# Patient Record
Sex: Male | Born: 1945 | Race: White | Hispanic: No | Smoking: Former smoker
Health system: Southern US, Community
[De-identification: ages and names within clinical notes are randomized; demographics above are authoritative.]

## PROBLEM LIST (undated history)

## (undated) DIAGNOSIS — J449 Chronic obstructive pulmonary disease, unspecified: Secondary | ICD-10-CM

## (undated) DIAGNOSIS — G629 Polyneuropathy, unspecified: Secondary | ICD-10-CM

## (undated) DIAGNOSIS — I219 Acute myocardial infarction, unspecified: Secondary | ICD-10-CM

## (undated) DIAGNOSIS — D649 Anemia, unspecified: Secondary | ICD-10-CM

## (undated) DIAGNOSIS — H5440 Blindness, one eye, unspecified eye: Secondary | ICD-10-CM

## (undated) DIAGNOSIS — N400 Enlarged prostate without lower urinary tract symptoms: Secondary | ICD-10-CM

## (undated) DIAGNOSIS — F329 Major depressive disorder, single episode, unspecified: Secondary | ICD-10-CM

## (undated) DIAGNOSIS — L03119 Cellulitis of unspecified part of limb: Secondary | ICD-10-CM

## (undated) DIAGNOSIS — I739 Peripheral vascular disease, unspecified: Secondary | ICD-10-CM

## (undated) DIAGNOSIS — E871 Hypo-osmolality and hyponatremia: Secondary | ICD-10-CM

## (undated) DIAGNOSIS — L97509 Non-pressure chronic ulcer of other part of unspecified foot with unspecified severity: Secondary | ICD-10-CM

## (undated) DIAGNOSIS — I2510 Atherosclerotic heart disease of native coronary artery without angina pectoris: Secondary | ICD-10-CM

## (undated) DIAGNOSIS — I340 Nonrheumatic mitral (valve) insufficiency: Secondary | ICD-10-CM

## (undated) DIAGNOSIS — I8390 Asymptomatic varicose veins of unspecified lower extremity: Secondary | ICD-10-CM

## (undated) DIAGNOSIS — I10 Essential (primary) hypertension: Secondary | ICD-10-CM

## (undated) DIAGNOSIS — S92909A Unspecified fracture of unspecified foot, initial encounter for closed fracture: Secondary | ICD-10-CM

## (undated) DIAGNOSIS — I6522 Occlusion and stenosis of left carotid artery: Secondary | ICD-10-CM

## (undated) DIAGNOSIS — I509 Heart failure, unspecified: Secondary | ICD-10-CM

## (undated) DIAGNOSIS — E785 Hyperlipidemia, unspecified: Secondary | ICD-10-CM

## (undated) DIAGNOSIS — L02419 Cutaneous abscess of limb, unspecified: Secondary | ICD-10-CM

## (undated) DIAGNOSIS — IMO0002 Reserved for concepts with insufficient information to code with codable children: Secondary | ICD-10-CM

## (undated) DIAGNOSIS — M519 Unspecified thoracic, thoracolumbar and lumbosacral intervertebral disc disorder: Secondary | ICD-10-CM

## (undated) DIAGNOSIS — M1990 Unspecified osteoarthritis, unspecified site: Secondary | ICD-10-CM

## (undated) DIAGNOSIS — F32A Depression, unspecified: Secondary | ICD-10-CM

## (undated) DIAGNOSIS — F419 Anxiety disorder, unspecified: Secondary | ICD-10-CM

## (undated) DIAGNOSIS — K219 Gastro-esophageal reflux disease without esophagitis: Secondary | ICD-10-CM

## (undated) DIAGNOSIS — G2581 Restless legs syndrome: Secondary | ICD-10-CM

## (undated) DIAGNOSIS — G894 Chronic pain syndrome: Secondary | ICD-10-CM

## (undated) DIAGNOSIS — H539 Unspecified visual disturbance: Secondary | ICD-10-CM

## (undated) DIAGNOSIS — E119 Type 2 diabetes mellitus without complications: Secondary | ICD-10-CM

## (undated) DIAGNOSIS — Z87898 Personal history of other specified conditions: Secondary | ICD-10-CM

## (undated) DIAGNOSIS — H547 Unspecified visual loss: Secondary | ICD-10-CM

## (undated) DIAGNOSIS — E559 Vitamin D deficiency, unspecified: Secondary | ICD-10-CM

## (undated) DIAGNOSIS — E11621 Type 2 diabetes mellitus with foot ulcer: Secondary | ICD-10-CM

## (undated) HISTORY — DX: Hyperlipidemia, unspecified: E78.5

## (undated) HISTORY — DX: Anxiety disorder, unspecified: F41.9

## (undated) HISTORY — DX: Asymptomatic varicose veins of unspecified lower extremity: I83.90

## (undated) HISTORY — DX: Hypo-osmolality and hyponatremia: E87.1

## (undated) HISTORY — DX: Chronic pain syndrome: G89.4

## (undated) HISTORY — DX: Unspecified visual disturbance: H53.9

## (undated) HISTORY — PX: CATARACT EXTRACTION: SUR2

## (undated) HISTORY — DX: Essential (primary) hypertension: I10

## (undated) HISTORY — DX: Unspecified thoracic, thoracolumbar and lumbosacral intervertebral disc disorder: M51.9

## (undated) HISTORY — DX: Vitamin D deficiency, unspecified: E55.9

## (undated) HISTORY — DX: Unspecified visual loss: H54.7

## (undated) HISTORY — DX: Non-pressure chronic ulcer of other part of unspecified foot with unspecified severity: L97.509

## (undated) HISTORY — DX: Blindness, one eye, unspecified eye: H54.40

## (undated) HISTORY — DX: Chronic obstructive pulmonary disease, unspecified: J44.9

## (undated) HISTORY — DX: Unspecified osteoarthritis, unspecified site: M19.90

## (undated) HISTORY — DX: Major depressive disorder, single episode, unspecified: F32.9

## (undated) HISTORY — DX: Benign prostatic hyperplasia without lower urinary tract symptoms: N40.0

## (undated) HISTORY — DX: Type 2 diabetes mellitus with foot ulcer: E11.621

## (undated) HISTORY — DX: Depression, unspecified: F32.A

## (undated) HISTORY — DX: Type 2 diabetes mellitus without complications: E11.9

## (undated) HISTORY — DX: Anemia, unspecified: D64.9

## (undated) HISTORY — DX: Peripheral vascular disease, unspecified: I73.9

## (undated) HISTORY — DX: Gastro-esophageal reflux disease without esophagitis: K21.9

## (undated) HISTORY — PX: CARPAL TUNNEL RELEASE: SHX101

## (undated) HISTORY — PX: LUMBAR DISC SURGERY: SHX700

## (undated) HISTORY — DX: Reserved for concepts with insufficient information to code with codable children: IMO0002

## (undated) HISTORY — DX: Unspecified fracture of unspecified foot, initial encounter for closed fracture: S92.909A

## (undated) HISTORY — DX: Occlusion and stenosis of left carotid artery: I65.22

## (undated) HISTORY — DX: Polyneuropathy, unspecified: G62.9

## (undated) HISTORY — PX: ANGIOPLASTY: SHX39

## (undated) HISTORY — DX: Atherosclerotic heart disease of native coronary artery without angina pectoris: I25.10

## (undated) HISTORY — PX: NASAL SINUS SURGERY: SHX719

---

## 1967-08-17 HISTORY — PX: OTHER SURGICAL HISTORY: SHX169

## 1974-08-16 HISTORY — PX: OTHER SURGICAL HISTORY: SHX169

## 1977-08-16 HISTORY — PX: OTHER SURGICAL HISTORY: SHX169

## 1994-08-16 HISTORY — PX: OTHER SURGICAL HISTORY: SHX169

## 1996-08-16 HISTORY — PX: OTHER SURGICAL HISTORY: SHX169

## 2004-08-16 HISTORY — PX: ROTATOR CUFF REPAIR: SHX139

## 2004-08-16 HISTORY — PX: OTHER SURGICAL HISTORY: SHX169

## 2006-08-16 HISTORY — PX: OTHER SURGICAL HISTORY: SHX169

## 2007-11-27 ENCOUNTER — Encounter: Payer: Self-pay | Admitting: Internal Medicine

## 2008-08-16 HISTORY — PX: OTHER SURGICAL HISTORY: SHX169

## 2009-05-06 ENCOUNTER — Encounter: Admission: RE | Admit: 2009-05-06 | Discharge: 2009-05-15 | Payer: Self-pay | Admitting: Orthopaedic Surgery

## 2009-06-04 ENCOUNTER — Encounter: Payer: Self-pay | Admitting: Internal Medicine

## 2009-06-25 ENCOUNTER — Emergency Department (HOSPITAL_COMMUNITY): Admission: EM | Admit: 2009-06-25 | Discharge: 2009-06-25 | Payer: Self-pay | Admitting: Emergency Medicine

## 2009-07-08 ENCOUNTER — Encounter: Payer: Self-pay | Admitting: Internal Medicine

## 2009-07-25 ENCOUNTER — Encounter: Payer: Self-pay | Admitting: Internal Medicine

## 2009-08-04 ENCOUNTER — Encounter: Payer: Self-pay | Admitting: Internal Medicine

## 2009-08-05 ENCOUNTER — Ambulatory Visit: Payer: Self-pay | Admitting: Internal Medicine

## 2009-08-05 DIAGNOSIS — E785 Hyperlipidemia, unspecified: Secondary | ICD-10-CM | POA: Insufficient documentation

## 2009-08-05 DIAGNOSIS — M5137 Other intervertebral disc degeneration, lumbosacral region: Secondary | ICD-10-CM

## 2009-08-05 DIAGNOSIS — G894 Chronic pain syndrome: Secondary | ICD-10-CM

## 2009-08-05 DIAGNOSIS — E119 Type 2 diabetes mellitus without complications: Secondary | ICD-10-CM | POA: Insufficient documentation

## 2009-08-05 DIAGNOSIS — F3289 Other specified depressive episodes: Secondary | ICD-10-CM | POA: Insufficient documentation

## 2009-08-05 DIAGNOSIS — D649 Anemia, unspecified: Secondary | ICD-10-CM | POA: Insufficient documentation

## 2009-08-05 DIAGNOSIS — F411 Generalized anxiety disorder: Secondary | ICD-10-CM | POA: Insufficient documentation

## 2009-08-05 DIAGNOSIS — F329 Major depressive disorder, single episode, unspecified: Secondary | ICD-10-CM | POA: Insufficient documentation

## 2009-08-05 DIAGNOSIS — M810 Age-related osteoporosis without current pathological fracture: Secondary | ICD-10-CM | POA: Insufficient documentation

## 2009-08-05 DIAGNOSIS — I10 Essential (primary) hypertension: Secondary | ICD-10-CM | POA: Insufficient documentation

## 2009-08-05 DIAGNOSIS — K219 Gastro-esophageal reflux disease without esophagitis: Secondary | ICD-10-CM | POA: Insufficient documentation

## 2009-08-05 DIAGNOSIS — N400 Enlarged prostate without lower urinary tract symptoms: Secondary | ICD-10-CM | POA: Insufficient documentation

## 2009-08-05 LAB — CONVERTED CEMR LAB
Calcium: 9.5 mg/dL (ref 8.4–10.5)
Cholesterol: 112 mg/dL (ref 0–200)
GFR calc non Af Amer: 120.93 mL/min (ref >60)
HDL: 41 mg/dL (ref >39.00)
Hgb A1c MFr Bld: 7.2 % — ABNORMAL HIGH (ref 4.6–6.5)
Potassium: 4.4 meq/L (ref 3.5–5.1)
Sodium: 130 meq/L — ABNORMAL LOW (ref 135–145)
Triglycerides: 132 mg/dL (ref 0.0–149.0)
VLDL: 26.4 mg/dL (ref 0.0–40.0)

## 2009-08-11 ENCOUNTER — Ambulatory Visit: Payer: Self-pay | Admitting: Internal Medicine

## 2009-08-11 DIAGNOSIS — M66329 Spontaneous rupture of flexor tendons, unspecified upper arm: Secondary | ICD-10-CM | POA: Insufficient documentation

## 2009-08-18 ENCOUNTER — Telehealth: Payer: Self-pay | Admitting: Internal Medicine

## 2009-08-20 ENCOUNTER — Encounter: Payer: Self-pay | Admitting: Internal Medicine

## 2009-08-22 ENCOUNTER — Telehealth: Payer: Self-pay | Admitting: Internal Medicine

## 2009-08-29 ENCOUNTER — Encounter: Payer: Self-pay | Admitting: Internal Medicine

## 2009-08-29 DIAGNOSIS — N200 Calculus of kidney: Secondary | ICD-10-CM | POA: Insufficient documentation

## 2009-08-29 DIAGNOSIS — J329 Chronic sinusitis, unspecified: Secondary | ICD-10-CM | POA: Insufficient documentation

## 2009-08-29 DIAGNOSIS — IMO0001 Reserved for inherently not codable concepts without codable children: Secondary | ICD-10-CM

## 2009-08-29 DIAGNOSIS — E559 Vitamin D deficiency, unspecified: Secondary | ICD-10-CM

## 2009-08-29 DIAGNOSIS — E0840 Diabetes mellitus due to underlying condition with diabetic neuropathy, unspecified: Secondary | ICD-10-CM | POA: Insufficient documentation

## 2009-08-29 DIAGNOSIS — E871 Hypo-osmolality and hyponatremia: Secondary | ICD-10-CM | POA: Insufficient documentation

## 2009-08-29 DIAGNOSIS — I739 Peripheral vascular disease, unspecified: Secondary | ICD-10-CM | POA: Insufficient documentation

## 2009-08-29 DIAGNOSIS — H547 Unspecified visual loss: Secondary | ICD-10-CM | POA: Insufficient documentation

## 2009-09-08 ENCOUNTER — Telehealth (INDEPENDENT_AMBULATORY_CARE_PROVIDER_SITE_OTHER): Payer: Self-pay | Admitting: *Deleted

## 2009-09-24 ENCOUNTER — Telehealth: Payer: Self-pay | Admitting: Internal Medicine

## 2009-09-25 ENCOUNTER — Telehealth: Payer: Self-pay | Admitting: Internal Medicine

## 2009-09-29 ENCOUNTER — Telehealth (INDEPENDENT_AMBULATORY_CARE_PROVIDER_SITE_OTHER): Payer: Self-pay | Admitting: *Deleted

## 2009-09-30 ENCOUNTER — Encounter (HOSPITAL_COMMUNITY): Admission: RE | Admit: 2009-09-30 | Discharge: 2009-11-19 | Payer: Self-pay | Admitting: Internal Medicine

## 2009-09-30 ENCOUNTER — Ambulatory Visit: Payer: Self-pay

## 2009-09-30 ENCOUNTER — Ambulatory Visit: Payer: Self-pay | Admitting: Cardiology

## 2009-10-01 ENCOUNTER — Encounter: Payer: Self-pay | Admitting: Internal Medicine

## 2009-10-17 ENCOUNTER — Ambulatory Visit: Payer: Self-pay | Admitting: Internal Medicine

## 2009-10-17 DIAGNOSIS — R5383 Other fatigue: Secondary | ICD-10-CM

## 2009-10-17 DIAGNOSIS — R5381 Other malaise: Secondary | ICD-10-CM

## 2009-10-20 LAB — CONVERTED CEMR LAB
Albumin: 4.1 g/dL (ref 3.5–5.2)
Alkaline Phosphatase: 68 units/L (ref 39–117)
BUN: 13 mg/dL (ref 6–23)
Basophils Absolute: 0 10*3/uL (ref 0.0–0.1)
Bilirubin Urine: NEGATIVE
CO2: 30 meq/L (ref 19–32)
Calcium: 9.2 mg/dL (ref 8.4–10.5)
Creatinine, Ser: 0.7 mg/dL (ref 0.4–1.5)
Eosinophils Absolute: 0.1 10*3/uL (ref 0.0–0.7)
GFR calc non Af Amer: 120.86 mL/min (ref >60)
Glucose, Bld: 129 mg/dL — ABNORMAL HIGH (ref 70–99)
HDL: 55.8 mg/dL (ref >39.00)
Hemoglobin: 12.7 g/dL — ABNORMAL LOW (ref 13.0–17.0)
Iron: 40 ug/dL — ABNORMAL LOW (ref 42–165)
Ketones, ur: NEGATIVE mg/dL
LDL Cholesterol: 46 mg/dL (ref 0–99)
Lymphocytes Relative: 19.2 % (ref 12.0–46.0)
Lymphs Abs: 1.1 10*3/uL (ref 0.7–4.0)
MCHC: 32.7 g/dL (ref 30.0–36.0)
Microalb, Ur: 2.1 mg/dL — ABNORMAL HIGH (ref 0.0–1.9)
Neutro Abs: 3.5 10*3/uL (ref 1.4–7.7)
Platelets: 241 10*3/uL (ref 150.0–400.0)
RDW: 12.8 % (ref 11.5–14.6)
Saturation Ratios: 11.2 % — ABNORMAL LOW (ref 20.0–50.0)
Sodium: 126 meq/L — ABNORMAL LOW (ref 135–145)
TSH: 1.43 microintl units/mL (ref 0.35–5.50)
Total Bilirubin: 0.2 mg/dL — ABNORMAL LOW (ref 0.3–1.2)
Total CHOL/HDL Ratio: 2
Total Protein, Urine: NEGATIVE mg/dL
Transferrin: 255.1 mg/dL (ref 212.0–360.0)
VLDL: 24.8 mg/dL (ref 0.0–40.0)
Vit D, 25-Hydroxy: 29 ng/mL — ABNORMAL LOW (ref 30–89)
pH: 6.5 (ref 5.0–8.0)

## 2009-10-30 ENCOUNTER — Encounter: Payer: Self-pay | Admitting: Internal Medicine

## 2009-11-04 ENCOUNTER — Ambulatory Visit: Payer: Self-pay | Admitting: Internal Medicine

## 2009-11-13 ENCOUNTER — Ambulatory Visit: Payer: Self-pay | Admitting: Internal Medicine

## 2009-11-13 DIAGNOSIS — M549 Dorsalgia, unspecified: Secondary | ICD-10-CM | POA: Insufficient documentation

## 2009-11-25 ENCOUNTER — Emergency Department (HOSPITAL_COMMUNITY): Admission: EM | Admit: 2009-11-25 | Discharge: 2009-11-25 | Payer: Self-pay | Admitting: Family Medicine

## 2009-12-03 ENCOUNTER — Telehealth: Payer: Self-pay | Admitting: Internal Medicine

## 2009-12-16 ENCOUNTER — Encounter: Payer: Self-pay | Admitting: Internal Medicine

## 2009-12-25 ENCOUNTER — Telehealth: Payer: Self-pay | Admitting: Internal Medicine

## 2009-12-29 ENCOUNTER — Telehealth: Payer: Self-pay | Admitting: Internal Medicine

## 2010-01-08 ENCOUNTER — Telehealth: Payer: Self-pay | Admitting: Internal Medicine

## 2010-02-03 ENCOUNTER — Telehealth: Payer: Self-pay | Admitting: Internal Medicine

## 2010-03-03 ENCOUNTER — Telehealth: Payer: Self-pay | Admitting: Internal Medicine

## 2010-03-12 ENCOUNTER — Telehealth: Payer: Self-pay | Admitting: Internal Medicine

## 2010-03-13 ENCOUNTER — Ambulatory Visit: Payer: Self-pay | Admitting: Internal Medicine

## 2010-03-13 ENCOUNTER — Telehealth: Payer: Self-pay | Admitting: Internal Medicine

## 2010-03-13 DIAGNOSIS — J019 Acute sinusitis, unspecified: Secondary | ICD-10-CM

## 2010-03-14 LAB — CONVERTED CEMR LAB
CO2: 28 meq/L (ref 19–32)
Calcium: 9.3 mg/dL (ref 8.4–10.5)
Chloride: 90 meq/L — ABNORMAL LOW (ref 96–112)
HDL: 37 mg/dL — ABNORMAL LOW (ref >39.00)
Hgb A1c MFr Bld: 6.8 % — ABNORMAL HIGH (ref 4.6–6.5)
Potassium: 4.4 meq/L (ref 3.5–5.1)
Sodium: 125 meq/L — ABNORMAL LOW (ref 135–145)
Total CHOL/HDL Ratio: 5
Triglycerides: 234 mg/dL — ABNORMAL HIGH (ref 0.0–149.0)
VLDL: 46.8 mg/dL — ABNORMAL HIGH (ref 0.0–40.0)

## 2010-03-16 ENCOUNTER — Telehealth: Payer: Self-pay | Admitting: Internal Medicine

## 2010-03-19 ENCOUNTER — Telehealth: Payer: Self-pay | Admitting: Internal Medicine

## 2010-03-19 ENCOUNTER — Encounter: Payer: Self-pay | Admitting: Internal Medicine

## 2010-03-30 ENCOUNTER — Encounter: Payer: Self-pay | Admitting: Internal Medicine

## 2010-03-31 ENCOUNTER — Encounter: Payer: Self-pay | Admitting: Internal Medicine

## 2010-04-06 ENCOUNTER — Telehealth: Payer: Self-pay | Admitting: Internal Medicine

## 2010-04-20 ENCOUNTER — Emergency Department (HOSPITAL_COMMUNITY): Admission: EM | Admit: 2010-04-20 | Discharge: 2010-04-20 | Payer: Self-pay | Admitting: Emergency Medicine

## 2010-04-27 ENCOUNTER — Telehealth: Payer: Self-pay | Admitting: Internal Medicine

## 2010-06-04 ENCOUNTER — Ambulatory Visit: Payer: Self-pay | Admitting: Surgery

## 2010-09-08 ENCOUNTER — Ambulatory Visit (HOSPITAL_COMMUNITY)
Admission: RE | Admit: 2010-09-08 | Discharge: 2010-09-09 | Payer: Self-pay | Source: Home / Self Care | Attending: Cardiology | Admitting: Cardiology

## 2010-09-09 LAB — BASIC METABOLIC PANEL
CO2: 24 mEq/L (ref 19–32)
Calcium: 8.9 mg/dL (ref 8.4–10.5)
Calcium: 8.6 mg/dL (ref 8.4–10.5)
GFR calc Af Amer: >60 mL/min (ref >60)
GFR calc Af Amer: >60 mL/min (ref >60)
GFR calc non Af Amer: >60 mL/min (ref >60)
GFR calc non Af Amer: >60 mL/min (ref >60)
Glucose, Bld: 162 mg/dL — ABNORMAL HIGH (ref 70–99)
Glucose, Bld: 135 mg/dL — ABNORMAL HIGH (ref 70–99)
Potassium: 3.9 mEq/L (ref 3.5–5.1)
Potassium: 3.9 mEq/L (ref 3.5–5.1)
Sodium: 132 mEq/L — ABNORMAL LOW (ref 135–145)
Sodium: 131 mEq/L — ABNORMAL LOW (ref 135–145)

## 2010-09-09 LAB — GLUCOSE, CAPILLARY: Glucose-Capillary: 179 mg/dL — ABNORMAL HIGH (ref 70–99)

## 2010-09-09 LAB — HEMOGLOBIN AND HEMATOCRIT, BLOOD: Hemoglobin: 11.4 g/dL — ABNORMAL LOW (ref 13.0–17.0)

## 2010-09-10 LAB — GLUCOSE, CAPILLARY: Glucose-Capillary: 141 mg/dL — ABNORMAL HIGH (ref 70–99)

## 2010-09-14 ENCOUNTER — Other Ambulatory Visit: Payer: Self-pay | Admitting: Surgery

## 2010-09-14 ENCOUNTER — Ambulatory Visit: Admit: 2010-09-14 | Payer: Self-pay | Admitting: Surgery

## 2010-09-14 ENCOUNTER — Encounter
Admission: RE | Admit: 2010-09-14 | Discharge: 2010-09-14 | Payer: Self-pay | Source: Home / Self Care | Attending: Surgery | Admitting: Surgery

## 2010-09-14 DIAGNOSIS — I7409 Other arterial embolism and thrombosis of abdominal aorta: Secondary | ICD-10-CM

## 2010-09-15 NOTE — Progress Notes (Signed)
Summary: please call pt   Phone Note Call from Patient Call back at Home Phone [phone]   Caller: Patient Call For: Anthony Borg MD Summary of Call: Pt states if Dr Jenny Reichmann want a report reguarding his blood flow in his lower extremity reguarding his bood flow . the office can requset this from [name] pt signed a Medical release , would like the nurse to call so he can give the correct name of the report so Dr Jenny Reichmann can have it in his records here , mentioned this at the last office visit to Dr Jenny Reichmann , want the [name] nurse to give him a call , so he can give the information  pt is scheduled for  cardiolite  appt 2/15 @ 11:30 Initial call taken by: Nonah Mattes,  September 25, 2009 4:04 PM  Follow-up for Phone Call        pt stated that he had a procedure done at [facility] where "they put blood pressure cuffs on my leg to check pressure  in lower extremeties". I advised pt to call [facility] and 1) get the name of the test/procedure and 2) request a copy be sent to Dr. Jenny Reichmann as his PCP. pt says he would do this because he thinks this may be helpful with upcoming stress test. Follow-up by: Crissie Sickles, [place],  September 25, 2009 4:23 PM
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
## 2010-09-15 NOTE — Assessment & Plan Note (Signed)
Summary: 3 MO ROV /NWS  #    Vital Signs:  Patient profile:   65 year old male Height:      71 inches Weight:      222 pounds BMI:     31.07 O2 Sat:      97 % on Room air Temp:     98.4 degrees F oral Pulse rate:   60 / minute BP sitting:   130 / 60  (left arm) Cuff size:   large  Vitals Entered ByShirlean Mylar Ewing (October 17, 2009 1:18 PM)  O2 Flow:  Room air  Preventive Care Screening  Last Flu Shot:    Date:  06/16/2009    Results:  given   Last Pneumovax:    Date:  08/17/2007    Results:  given   Colonoscopy:    Date:  08/16/1994    Results:  normal      declines colonoscopy  CC: 3 Mo ROV/RE   CC:  3 Mo ROV/RE.  History of Present Illness: sched for ent/sinus surgury mar 18  - a "sinus obliteration reversal";  due for med refills as well for chronic pain which is stable, will also need right rot cuff repeair but not yet scheduled - per dr Carollee Leitz.  Pt denies CP, sob, doe, wheezing, orthopnea, pnd, worsening LE edema, palps, dizziness or syncope   Pt denies new neuro symptoms such as headache, facial or extremity weakness   No recent bone fx.  Pt denies polydipsia, polyuria, or low sugar symptoms such as shakiness improved with eating.  Overall good compliance with meds, trying to follow low chol, DM diet, wt stable, little excercise however   Has ongoing mild fatigue persistnet, denies OSA symtpoms.    Here for wellness Diet: Heart Healthy or DM if diabetic Physical Activities: Sedentary Depression/mood screen: Negative Hearing: Intact bilateral Visual Acuity: Grossly normal ADL's: Capable  Fall Risk: None Home Safety: Good End-of-Life Planning: Advance directive - Full code/I agree   Problems Prior to Update: 1)  Special Screening Malig Neoplasms Other Sites  (ICD-V76.49) 2)  Fatigue  (ICD-780.79) 3)  Preoperative Examination  (ICD-V72.84) 4)  Diabetic Peripheral Neuropathy  (ICD-250.60) 5)  Fibromyalgia  (ICD-729.1) 6)  Sinusitis, Chronic   (ICD-473.9) 7)  Peripheral Vascular Disease  (ICD-443.9) 8)  Visual Acuity, Decreased, Left Eye  (ICD-369.9) 9)  Unspecified Vitamin D Deficiency  (ICD-268.9) 10)  Nephrolithiasis  (ICD-592.0) 11)  Hyponatremia, Chronic  (ICD-276.1) 12)  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 1 By Mouth Once Daily 2)  Vitamin D 1000 Unit Tabs (Cholecalciferol) .Marland Kitchen.. 1 By Mouth Once Daily 3)  Tizanidine Hcl 4 Mg Tabs (Tizanidine Hcl) .Marland Kitchen.. 1 Per 6 Hours As Needed 4)  Plavix 75 Mg Tabs (Clopidogrel Bisulfate) .Marland Kitchen.. 1 By Mouth Once Daily 5)  Clonazepam 1 Mg Tabs (Clonazepam) .Marland Kitchen.. 1po Three Times A Day As Needed  - To Refill Aug 11, 2009 6)  Percocet 10-325 Mg Tabs (Oxycodone-Acetaminophen) .Marland KitchenMarland KitchenMarland Kitchen 1  By Mouth Four Times Per Day As Needed For Pain - To Fill Sep 24, 2009 7)  Flomax 0.4 Mg Caps (Tamsulosin Hcl) .Marland Kitchen.. 1 By Mouth Every Morning and 1 By Mouth Every Night 8)  Celebrex 100 Mg Caps (Celecoxib) .Marland Kitchen.. 1 By Mouth Two Times A Day 9)  Omeprazole 40 Mg Cpdr (Omeprazole) .Marland Kitchen.. 1 By Mouth Am and 1by Mouth Pm 10)  Enalapril Maleate 5 Mg Tabs (Enalapril Maleate) .Marland Kitchen.. 1 By Mouth Once Daily 11)  Glimepiride 4 Mg Tabs (Glimepiride) .Marland Kitchen.. 1 By Mouth Once  As Needed 12)  Bupropion Hcl 100 Mg Xr12h-Tab (Bupropion Hcl) .Marland Kitchen.. 1 By Mouth Three Times A Day 13)  Daily Multiple Vitamins  Tabs (Multiple Vitamin) .Marland Kitchen.. 1 By Mouth Once Daily 14)  Forteo 600 Mcg/2.51ml Soln (Teriparatide (Recombinant)) .... Use Asd 15)   Thermotabs  Tabs (Oral Electrolytes) .... 4 To 5 By Mouth Once Daily 16)  Fentanyl 100 Mcg/hr Pt72 (Fentanyl) .Marland Kitchen.. 1 Q 3 Days - To Fill Oct 04, 2009 17)  Fentanyl 25 Mcg/hr Pt72 (Fentanyl) .Marland Kitchen.. 1 Q 3 Days - To Fill Oct 04, 2009 18)  Zonisamide 50 Mg Caps (Zonisamide) .... 3 By Mouth At Bedtime  Current Medications (verified): 1)  Simvastatin 40 Mg Tabs (Simvastatin) .Marland Kitchen.. 1 By Mouth Once Daily 2)  Vitamin D 1000 Unit Tabs (Cholecalciferol) .Marland Kitchen.. 1 By Mouth Once Daily 3)  Tizanidine Hcl 4 Mg Tabs (Tizanidine Hcl) .Marland Kitchen.. 1 Per 6 Hours As Needed 4)  Plavix 75 Mg Tabs (Clopidogrel Bisulfate) .Marland Kitchen.. 1 By Mouth Once Daily 5)  Clonazepam 1 Mg Tabs (Clonazepam) .Marland Kitchen.. 1po Three Times A Day As Needed  - To Refill Aug 11, 2009 6)  Percocet 10-325 Mg Tabs (Oxycodone-Acetaminophen) .Marland Kitchen.. 1 By Mouth Four Times Per Day As Needed For Pain - To Fill Dec 23, 2009 7)  Flomax 0.4 Mg Caps (Tamsulosin Hcl) .Marland Kitchen.. 1 By Mouth Every Morning and 1 By Mouth Every Night 8)  Celebrex 100 Mg Caps (Celecoxib) .Marland Kitchen.. 1 By Mouth Two Times A Day 9)  Omeprazole 40 Mg Cpdr (Omeprazole) .Marland Kitchen.. 1 By Mouth Am and 1by Mouth Pm 10)  Enalapril Maleate 5 Mg Tabs (Enalapril Maleate) .Marland Kitchen.. 1 By Mouth Once Daily 11)  Glimepiride 4 Mg Tabs (Glimepiride) .Marland Kitchen.. 1 By Mouth Once  As Needed 12)  Bupropion Hcl 100 Mg Xr12h-Tab (Bupropion Hcl) .Marland Kitchen.. 1 By Mouth Three Times A Day 13)  Daily Multiple Vitamins  Tabs (Multiple Vitamin) .Marland Kitchen.. 1 By Mouth Once Daily 14)  Forteo 600 Mcg/2.103ml Soln (Teriparatide (Recombinant)) .... Use Asd 15)  Thermotabs  Tabs (Oral Electrolytes) .... 4 To 5 By Mouth Once Daily 16)  Fentanyl 100 Mcg/hr Pt72 (Fentanyl) .Marland Kitchen.. 1 Q 3 Days - To Fill Jan 02, 2010 17)  Fentanyl 25 Mcg/hr Pt72 (Fentanyl) .Marland Kitchen.. 1 Q 3 Days - To Fill Jan 02, 2010 18)  Zonisamide 50 Mg Caps (Zonisamide) .... 3 By Mouth At Bedtime  Allergies (verified): 1)  ! Sulfa 2)  ! 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 GERD Diabetes mellitus, type II Anxiety Depression Benign prostatic hypertrophy Hyperlipidemia chronic pain syndrome  Past Surgical History: Last updated: 08/05/2009 s/p right hand fracture 1969 s/p left ankle ganglion cyst 1976 s/p lumbar disc surgury 1981, 2004  s/p multiple sinus surguries x 8 - last 1997 with obliteration s/o left cataract 1996 s/p right cataract 1997 s/p left foot surgury 1998 s/p right foot surgury/fracture 1999 s/p left CTS 2006 s/p right  CTS 2007 s/p left wrist/hand fusion 2008 s/p left foot open repair jones fracture 5th metatarsal 2010 s/p right rotater cuff 2006 s/p vocal surgury 1996  Family History: Last updated: 08/05/2009 father and grandmother with lung cancer multiple others with throat and lung cancer, heart disease and renal failure related to "bad living" mother, sister, m-grandfather with DM  Social History: Last updated: 08/05/2009 Divorced Former Smoker Alcohol use-no 2 adult sons - 54 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         all otherwise negative per pt -  Physical Exam  General:  alert and overweight-appearing.   Head:  normocephalic and atraumatic.   Eyes:  vision grossly intact, pupils equal,  and pupils round.   Ears:  R ear normal and L ear normal.   Nose:  no external deformity and no nasal discharge.   Mouth:  no gingival abnormalities and pharynx pink and moist.   Neck:  supple and no masses.   Lungs:  normal respiratory effort and normal breath sounds.   Heart:  normal rate and regular rhythm.   Abdomen:  soft, non-tender, and normal bowel sounds.   Msk:  no joint tenderness and no joint swelling.   Extremities:  no edema, no erythema  Neurologic:  cranial nerves II-XII intact and strength normal in all extremities.   Skin:  color normal and no rashes.   Psych:  not depressed appearing and moderately anxious.     Impression & Recommendations:  Problem # 1:  Preventive Health Care (ICD-V70.0)  Overall doing well, age appropriate education and counseling updated and referral for appropriate preventive services done unless declined, immunizations up to date or declined, diet counseling done if overweight, urged to quit smoking if smokes , most recent labs reviewed and current ordered if appropriate, ecg reviewed or declined (interpretation per ECG scanned in the EMR if done); information regarding Medicare Prevention requirements given if appropriate   Orders: First annual wellness visit with prevention plan  [id number])  Problem # 2:  DISC DISEASE, LUMBAR (ICD-722.52)  stable overall by hx and exam, ok to continue meds/tx as is, for chonric pain med refill  Orders: Prescription Created Electronically [phone])  Problem # 3:  DIABETES MELLITUS, TYPE II (ICD-250.00)  His updated medication list for this problem includes:    Enalapril Maleate 5 Mg Tabs (Enalapril maleate) .Marland Kitchen... 1 by mouth once daily    Glimepiride 4 Mg Tabs (Glimepiride) .Marland Kitchen... 1 by mouth once  as needed  Orders: TLB-Lipid Panel (80061-LIPID) TLB-Microalbumin/Creat Ratio, Urine (82043-MALB) TLB-A1C / Hgb A1C (Glycohemoglobin) (83036-A1C)  Labs Reviewed: Creat: 0.7 (08/05/2009)    Reviewed HgBA1c  results: 7.2 (08/05/2009) stable overall by hx and exam, ok to continue meds/tx as is   Problem # 4:  FATIGUE (ICD-780.79)  exam benign, to check labs below; follow with expectant management   Orders: T-Vitamin D (25-Hydroxy) [id number]) TLB-BMP (Basic Metabolic Panel-BMET) (99991111)  TLB-CBC Platelet - w/Differential (85025-CBCD) TLB-Hepatic/Liver Function Pnl (80076-HEPATIC) TLB-TSH (Thyroid Stimulating Hormone) (84443-TSH) TLB-Sedimentation Rate (ESR) (85652-ESR) TLB-IBC Pnl (Iron/FE;Transferrin) (83550-IBC) TLB-B12 + Folate Pnl (82746_82607-B12/FOL) TLB-Udip ONLY (81003-UDIP)  Problem # 5:  OSTEOPOROSIS (ICD-733.00)  His updated medication list for this problem includes:    Forteo 600 Mcg/2.72ml Soln (Teriparatide (recombinant)) ..... Use asd  Orders: T-Parathyroid Hormone, Intact w/ Calcium [id number])  Discussed medication use, applications of heat or ice, and exercises.   sees [facility] endo; to check lab above, mentioned to pt new prolia though not approved for men yet  Complete Medication List: 1)  Simvastatin 40 Mg Tabs (Simvastatin) .Marland Kitchen.. 1 by mouth once daily 2)  Vitamin D 1000 Unit Tabs (Cholecalciferol) .Marland Kitchen.. 1 by mouth once daily 3)  Tizanidine Hcl 4 Mg Tabs (Tizanidine hcl) .Marland Kitchen.. 1 per 6 hours as needed 4)  Plavix 75 Mg Tabs (Clopidogrel bisulfate) .Marland Kitchen.. 1 by mouth once daily 5)  Clonazepam 1 Mg Tabs (Clonazepam) .Marland Kitchen.. 1po three times a day as needed  - to refill Aug 11, 2009 6)  Percocet 10-325 Mg Tabs (Oxycodone-acetaminophen) .Marland Kitchen.. 1 by mouth four times per day as needed for pain - to fill Dec 23, 2009 7)  Flomax 0.4 Mg Caps (Tamsulosin hcl) .Marland Kitchen.. 1 by mouth every morning and 1 by mouth every night 8)  Celebrex 100 Mg Caps (Celecoxib) .Marland Kitchen.. 1 by mouth two times a day 9)  Omeprazole 40 Mg Cpdr (Omeprazole) .Marland Kitchen.. 1 by mouth am and 1by mouth pm 10)  Enalapril Maleate 5 Mg Tabs (Enalapril maleate) .Marland Kitchen.. 1 by mouth once daily 11)  Glimepiride 4 Mg Tabs (Glimepiride)  .Marland Kitchen.. 1 by mouth once  as needed 12)  Bupropion Hcl 100 Mg Xr12h-tab (Bupropion hcl) .Marland Kitchen.. 1 by mouth three times a day 13)  Daily Multiple Vitamins Tabs (Multiple vitamin) .Marland Kitchen.. 1 by mouth once daily 14)  Forteo 600 Mcg/2.21ml Soln (Teriparatide (recombinant)) .... Use asd 15)  Thermotabs Tabs (Oral electrolytes) .... 4 to 5 by mouth once daily 16)  Fentanyl 100 Mcg/hr Pt72 (Fentanyl) .Marland Kitchen.. 1 q 3 days - to fill Jan 02, 2010 17)  Fentanyl 25 Mcg/hr Pt72 (Fentanyl) .Marland Kitchen.. 1 q 3 days - to fill Jan 02, 2010 18)  Zonisamide 50 Mg Caps (Zonisamide) .... 3 by mouth at bedtime  Other Orders: TD Toxoids IM 7 YR + [id number]) Admin 1st Vaccine [id number]) TLB-PSA (Prostate Specific Antigen) (84153-PSA)  Patient Instructions: 1)  you are given the pain medications refills for now 2)  please keep your folloup appt at [facility] and Dr Theda Sers as planned 3)  Please go to the Lab in the basement for your blood and/or urine tests today 4)  you had the tetanus shot today 5)  Please schedule a follow-up appointment in 6 months. 6)  Please call for refills in the meantime if you need them (or have the pharmacy call) Prescriptions: FENTANYL 25 MCG/HR PT72 (FENTANYL) 1 q 3 days - to fill Jan 02, 2010  #10 x 0   Entered and Authorized by:   Biagio Borg MD   Signed by:   Biagio Borg MD on 10/17/2009   Method used:   Print then Give to Patient   RxID:   [id number] FENTANYL 100 MCG/HR PT72 (FENTANYL) 1 q 3 days - to fill Jan 02, 2010  #10 x 0   Entered and Authorized by:   Biagio Borg MD   Signed by:   Biagio Borg MD on 10/17/2009   Method used:  Print then Give to Patient   RxID:   [id number] FENTANYL 25 MCG/HR PT72 (FENTANYL) 1 q 3 days - to fill Dec 03, 2009  #10 x 0   Entered and Authorized by:   Biagio Borg MD   Signed by:   Biagio Borg MD on 10/17/2009   Method used:   Print then Give to Patient   RxID:   [id number] FENTANYL 100 MCG/HR PT72 (FENTANYL) 1 q 3 days - to fill Dec 03, 2009  #10 x  0   Entered and Authorized by:   Biagio Borg MD   Signed by:   Biagio Borg MD on 10/17/2009   Method used:   Print then Give to Patient   RxID:   [id number] PERCOCET 10-325 MG TABS (OXYCODONE-ACETAMINOPHEN) 1 by mouth four times per day as needed for pain - to fill Dec 23, 2009  #120 x 0   Entered and Authorized by:   Biagio Borg MD   Signed by:   Biagio Borg MD on 10/17/2009   Method used:   Print then Give to Patient   RxID:   [id number] PERCOCET 10-325 MG TABS (OXYCODONE-ACETAMINOPHEN) 1 by mouth four times per day as needed for pain - to fill Nov 23, 2009  #120 x 0   Entered and Authorized by:   Biagio Borg MD   Signed by:   Biagio Borg MD on 10/17/2009   Method used:   Print then Give to Patient   RxID:   [id number] PERCOCET 10-325 MG TABS (OXYCODONE-ACETAMINOPHEN) 1 by mouth four times per day as needed for pain - to fill Oct 24, 2009  #120 x 0   Entered and Authorized by:   Biagio Borg MD   Signed by:   Biagio Borg MD on 10/17/2009   Method used:   Print then Give to Patient   RxID:   [id number] FENTANYL 25 MCG/HR PT72 (FENTANYL) 1 q 3 days - to fill Nov 03, 2009  #10 x 0   Entered and Authorized by:   Biagio Borg MD   Signed by:   Biagio Borg MD on 10/17/2009   Method used:   Print then Give to Patient   RxID:   [id number] FENTANYL 100 MCG/HR PT72 (FENTANYL) 1 q 3 days - to fill mar21, 2011  #10 x 0   Entered and Authorized by:   Biagio Borg MD   Signed by:   Biagio Borg MD on 10/17/2009   Method used:   Print then Give to Patient   [id number]    Immunizations Administered:  Tetanus Vaccine:    Vaccine Type: Td    Site: left deltoid    Mfr: GlaxoSmithKline    Dose: 0.5 ml    Route: IM    Given by: Shirlean Mylar Ewing    Exp. Date: 07/01/2011    Lot #: [id number]    VIS given: 07/04/07 version given October 17, 2009.

## 2010-09-15 NOTE — Progress Notes (Signed)
Summary: Records received from [facility] received from [facility] and forwarded to Dr. Jenny Reichmann for review. Dena Chavis  September 08, 2009 3:52 PM  Appended Document: Records received from [facility] received from [facility] and forwarded to Dr. Jenny Reichmann for review.

## 2010-09-15 NOTE — Progress Notes (Signed)
Summary: PAIN PATCH REFILL  Phone Note Call from Patient Call back at Home Phone [phone]   Caller: [place] Summary of Call: PATIENT WANTS TO PICK UP Arley.  HE WANTS BOTH, 100 MG AND 25 MG.  HIS PHARMACY IS [facility]  [address] HOME [phone] CELL [phone] Initial call taken by: Glena Norfolk,  April 06, 2010 2:04 PM  Follow-up for Phone Call        Pt informed, Rx in cabinet for pt pick up Follow-up by: Crissie Sickles, [place],  April 07, 2010 8:02 AM    New/Updated Medications: FENTANYL 100 MCG/HR PT72 (FENTANYL) 1 q 3 days - to fill aug 28. 2011 FENTANYL 25 MCG/HR PT72 (FENTANYL) 1 q 3 days - to fill Apr 12, 2010 Prescriptions: FENTANYL 100 MCG/HR PT72 (FENTANYL) 1 q 3 days - to fill aug 28. 2011  #10 x 0   Entered and Authorized by:   Biagio Borg MD   Signed by:   Biagio Borg MD on 04/06/2010   Method used:   Print then Give to Patient   RxID:   [id number] FENTANYL 25 MCG/HR PT72 (FENTANYL) 1 q 3 days - to fill Apr 12, 2010  #10 x 0   Entered and Authorized by:   Biagio Borg MD   Signed by:   Biagio Borg MD on 04/06/2010   Method used:   Print then Give to Patient   RxID:   [id number]  done hardcopy to LIM side B - dahlia Biagio Borg MD  April 06, 2010 5:27 PM

## 2010-09-15 NOTE — Letter (Signed)
Summary: DIsmissal Activation Form  DIsmissal Activation Form   Imported By: Sherri Sear 06/03/2010 12:09:51  _____________________________________________________________________  External Attachment:    Type:   Image     Comment:   External Document

## 2010-09-15 NOTE — Progress Notes (Signed)
  Phone Note Refill Request  on August 18, 2009 3:45 PM  Refills Requested: Medication #1:  ENALAPRIL MALEATE 5 MG TABS 1 by mouth once daily   Dosage confirmed as above?Dosage Confirmed   Notes: [facility] [address] [phone] Initial call taken by: Sharon Seller,  August 18, 2009 3:45 PM    Prescriptions: ENALAPRIL MALEATE 5 MG TABS (ENALAPRIL MALEATE) 1 by mouth once daily  #30 x 11   Entered by:   Sharon Seller   Authorized by:   Biagio Borg MD   Signed by:   Sharon Seller on 08/18/2009   Method used:   Faxed to ...       [id number] (ret[facility]       [address]       [place], [place]  [id number]       Ph: [phone] or [phone]       Fax: [phone]   RxID:   [id number]

## 2010-09-15 NOTE — Assessment & Plan Note (Signed)
Summary: walk in, bad back pain,ok robin/cd   Vital Signs:  Patient profile:   65 year old male Height:      72 inches Weight:      227.50 pounds BMI:     30.97 O2 Sat:      96 % on Room air Temp:     97.3 degrees F oral Pulse rate:   57 / minute BP sitting:   110 / 50  (left arm) Cuff size:   large  Vitals Entered ByShirlean Mylar Ewing (November 13, 2009 3:04 PM)  O2 Flow:  Room air CC: Back Pain/RE   CC:  Back Pain/RE.  History of Present Illness: here with increased pain to the back from the shoulder blades to lowest lumbar area, about 3 to 4 days; ? spasms involved;  mod to severe;  current muscle relaxer not helping;   no falls or trauma, pain seems like before when found to have spontanous fracture per pt;  no neck pain, no extremity numb, wweak, or pain to extremities; no bowel or bladder changes;  no fever , wt loss, night sweats, gait problem.   has been taking the salt tabs since last visit.  also taking the [place] now instead of the sulfonyurea and no further low sugars;  Pt denies polydipsia, polyuria, or low sugar symptoms such as shakiness improved with eating.  Overall good compliance with meds, trying to follow low chol, DM diet, wt stable, little excercise however .  Pt denies new other neuro symptoms such as headache, facial or extremity weakness .    Problems Prior to Update: 1)  Back Pain  (ICD-724.5) 2)  Hyponatremia, Chronic  (ICD-276.1) 3)  Preventive Health Care  (ICD-V70.0) 4)  Special Screening Malig Neoplasms Other Sites  (ICD-V76.49) 5)  Fatigue  (ICD-780.79) 6)  Preoperative Examination  (ICD-V72.84) 7)  Diabetic Peripheral Neuropathy  (ICD-250.60) 8)  Fibromyalgia  (ICD-729.1) 9)  Sinusitis, Chronic  (ICD-473.9) 10)  Peripheral Vascular Disease  (ICD-443.9) 11)  Visual Acuity, Decreased, Left Eye  (ICD-369.9) 12)  Unspecified Vitamin D Deficiency  (ICD-268.9) 13)  Nephrolithiasis  (ICD-592.0) 14)  Hyponatremia, Chronic  (ICD-276.1) 15)  Chronic Right  Facial Pain/numb Due To Mult Sinus Surguries  () 16)  Biceps Tendon Rupture, Right  (ICD-727.62) 17)  Chronic Pain Syndrome  (ICD-338.4) 18)  Hyperlipidemia  (ICD-272.4) 19)  Benign Prostatic Hypertrophy  (ICD-600.00) 20)  Depression  (ICD-311) 21)  Anxiety  (ICD-300.00) 22)  Diabetes Mellitus, Type II  (ICD-250.00) 23)  Gerd  (ICD-530.81) 24)  Osteoporosis  (ICD-733.00) 25)  Anemia-nos  (ICD-285.9) 26)  Hypertension  (ICD-401.9) 27)  Disc Disease, Lumbar  (ICD-722.52)  Medications Prior to Update: 1)  Simvastatin 40 Mg Tabs (Simvastatin) .Marland Kitchen.. 1 By Mouth Once Daily 2)  Vitamin D 1000 Unit Tabs (Cholecalciferol) .Marland Kitchen.. 1 By Mouth Once Daily 3)  Tizanidine Hcl 4 Mg Tabs (Tizanidine Hcl) .Marland Kitchen.. 1 Per 6 Hours As Needed 4)  Plavix 75 Mg Tabs (Clopidogrel Bisulfate) .Marland Kitchen.. 1 By Mouth Once Daily 5)  Clonazepam 1 Mg Tabs (Clonazepam) .Marland Kitchen.. 1po Three Times A Day As Needed  - To Refill Aug 11, 2009 6)  Percocet 10-325 Mg Tabs (Oxycodone-Acetaminophen) .Marland Kitchen.. 1 By Mouth Four Times Per Day As Needed For Pain - To Fill Dec 23, 2009 7)  Flomax 0.4 Mg Caps (Tamsulosin Hcl) .Marland Kitchen.. 1 By Mouth Every Morning and 1 By Mouth Every Night 8)  Celebrex 100 Mg Caps (Celecoxib) .Marland Kitchen.. 1 By Mouth Two Times A Day 9)  Omeprazole 40 Mg Cpdr (Omeprazole) .Marland Kitchen.. 1 By Mouth Am and 1by Mouth Pm 10)  Enalapril Maleate 5 Mg Tabs (Enalapril Maleate) .Marland Kitchen.. 1 By Mouth Once Daily 11)  Januvia 50 Mg Tabs (Sitagliptin Phosphate) .Marland Kitchen.. 1po Once Daily 12)  Bupropion Hcl 100 Mg Xr12h-Tab (Bupropion Hcl) .Marland Kitchen.. 1 By Mouth Three Times A Day 13)  Daily Multiple Vitamins  Tabs (Multiple Vitamin) .Marland Kitchen.. 1 By Mouth Once Daily 14)  Forteo 600 Mcg/2.63ml Soln (Teriparatide (Recombinant)) .... Use Asd 15)  Thermotabs  Tabs (Oral Electrolytes) .... 4 To 5 By Mouth Once Daily 16)  Fentanyl 100 Mcg/hr Pt72 (Fentanyl) .Marland Kitchen.. 1 Q 3 Days - To Fill Jan 02, 2010 17)  Fentanyl 25 Mcg/hr Pt72 (Fentanyl) .Marland Kitchen.. 1 Q 3 Days - To Fill Jan 02, 2010 18)  Zonisamide 50 Mg Caps  (Zonisamide) .... 3 By Mouth At Bedtime 19)  Voltaren 1 % Gel (Diclofenac Sodium) .... Use Asd Two Times A Day  Current Medications (verified): 1)  Simvastatin 40 Mg Tabs (Simvastatin) .Marland Kitchen.. 1 By Mouth Once Daily 2)  Vitamin D 1000 Unit Tabs (Cholecalciferol) .Marland Kitchen.. 1 By Mouth Once Daily 3)  Flexeril 5 Mg Tabs (Cyclobenzaprine Hcl) .Marland Kitchen.. 1po Three Times A Day 4)  Plavix 75 Mg Tabs (Clopidogrel Bisulfate) .Marland Kitchen.. 1 By Mouth Once Daily 5)  Clonazepam 1 Mg Tabs (Clonazepam) .Marland Kitchen.. 1po Three Times A Day As Needed  - To Refill Aug 11, 2009 6)  Percocet 10-325 Mg Tabs (Oxycodone-Acetaminophen) .Marland Kitchen.. 1 By Mouth Four Times Per Day As Needed For Pain - To Fill Dec 23, 2009 7)  Flomax 0.4 Mg Caps (Tamsulosin Hcl) .Marland Kitchen.. 1 By Mouth Every Morning and 1 By Mouth Every Night 8)  Celebrex 100 Mg Caps (Celecoxib) .Marland Kitchen.. 1 By Mouth Two Times A Day 9)  Omeprazole 40 Mg Cpdr (Omeprazole) .Marland Kitchen.. 1 By Mouth Am and 1by Mouth Pm 10)  Enalapril Maleate 5 Mg Tabs (Enalapril Maleate) .Marland Kitchen.. 1 By Mouth Once Daily 11)  Januvia 50 Mg Tabs (Sitagliptin Phosphate) .Marland Kitchen.. 1po Once Daily 12)  Bupropion Hcl 100 Mg Xr12h-Tab (Bupropion Hcl) .Marland Kitchen.. 1 By Mouth Three Times A Day 13)  Daily Multiple Vitamins  Tabs (Multiple Vitamin) .Marland Kitchen.. 1 By Mouth Once Daily 14)  Forteo 600 Mcg/2.65ml Soln (Teriparatide (Recombinant)) .... Use Asd 15)  Thermotabs  Tabs (Oral Electrolytes) .... 4 To 5 By Mouth Once Daily 16)  Fentanyl 100 Mcg/hr Pt72 (Fentanyl) .Marland Kitchen.. 1 Q 3 Days - To Fill Jan 02, 2010 17)  Fentanyl 25 Mcg/hr Pt72 (Fentanyl) .Marland Kitchen.. 1 Q 3 Days - To Fill Jan 02, 2010 18)  Zonisamide 50 Mg Caps (Zonisamide) .... 3 By Mouth At Bedtime 19)  Voltaren 1 % Gel (Diclofenac Sodium) .... Use Asd Two Times A Day  Allergies (verified): 1)  ! Sulfa 2)  ! 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 GERD Diabetes mellitus, type II Anxiety Depression Benign prostatic hypertrophy Hyperlipidemia chronic pain syndrome  Past Surgical History: Last updated: 08/05/2009 s/p right hand fracture 1969 s/p left ankle ganglion cyst 1976 s/p lumbar disc surgury 1981, 2004 s/p multiple sinus surguries x 8 - last 1997 with obliteration s/o left cataract 1996 s/p right cataract 1997 s/p left foot surgury 1998 s/p right foot surgury/fracture 1999 s/p left  CTS 2006 s/p right  CTS 2007 s/p left wrist/hand fusion 2008 s/p left foot open repair jones fracture 5th metatarsal 2010 s/p right rotater cuff 2006 s/p vocal surgury 1996  Social History: Last updated: 08/05/2009 Divorced Former Smoker Alcohol use-no 2 adult sons - 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   Head:  normocephalic and atraumatic.   Eyes:  vision grossly intact, pupils equal, and pupils round.   Ears:  R ear normal and L ear normal.   Nose:  no external deformity and no nasal discharge.   Mouth:  no gingival abnormalities and pharynx pink and moist.   Neck:  supple and no masses.   Lungs:  normal respiratory effort and normal breath sounds.   Heart:  normal rate and regular rhythm.   Msk:  no cervical spine or paracervical spine tender;  no specific thoracic spine or lumbar tender except for approx l2 where pt states he has chronic tender;  also with marked left paralumbar area muscular spasm;  less to the right lumbar paravertebral as well as thoracic paravertebral milder tender Extremities:  no edema, no erythema  Neurologic:  cranial nerves II-XII intact and strength normal in all extremities.     Impression &  Recommendations:  Problem # 1:  BACK PAIN (ICD-724.5)  His updated medication list for this problem includes:    Flexeril 5 Mg Tabs (Cyclobenzaprine hcl) .Marland Kitchen... 1po three times a day    Percocet 10-325 Mg Tabs (Oxycodone-acetaminophen) .Marland Kitchen... 1 by mouth four times per day as needed for pain - to fill Dec 23, 2009    Celebrex 100 Mg Caps (Celecoxib) .Marland Kitchen... 1 by mouth two times a day    Fentanyl 100 Mcg/hr Pt72 (Fentanyl) .Marland Kitchen... 1 q 3 days - to fill Jan 02, 2010    Fentanyl 25 Mcg/hr Pt72 (Fentanyl) .Marland Kitchen... 1 q 3 days - to fill Jan 02, 2010 Continue all previous medications as before this visit except  change the tizaindine to flexeril as needed, for films today to r/o spont fx, but suspect more likely severe MSK strain  Orders: T-Thoracic Spine 2 Views [phone]) T-Lumbar Spine Complete, 5 Views ([id number])  Problem # 2:  HYPONATREMIA, CHRONIC (ICD-276.1) mentation seems improved today (somewhat slowed last visit) , declines repeat sodium today  Problem # 3:  DIABETES MELLITUS, TYPE II (ICD-250.00)  His updated medication list for this problem includes:    Enalapril Maleate 5 Mg Tabs (Enalapril maleate) .Marland Kitchen... 1 by mouth once daily    Januvia 50 Mg Tabs (Sitagliptin phosphate) .Marland Kitchen... 1po once daily tolerating the januvia better withour further low sugars, Continue all previous medications as before this visit , f/u labs with endo apr 27 and/or at next visit  Labs Reviewed: Creat: 0.7 (10/17/2009)    Reviewed HgBA1c results: 7.5 (10/17/2009)  7.2 (08/05/2009)  Problem # 4:  OSTEOPOROSIS (ICD-733.00)  His updated medication list for this problem includes:    Forteo 600 Mcg/2.48ml Soln (Teriparatide (recombinant)) ..... Use asd also asked pt to bring up the idea of prolia with endocrine if he has new fx on forteo  Complete Medication List: 1)  Simvastatin 40 Mg Tabs (Simvastatin) .Marland Kitchen.. 1 by mouth once daily 2)  Vitamin D 1000 Unit Tabs (Cholecalciferol) .Marland Kitchen.. 1 by mouth once daily  3)  Flexeril  5 Mg Tabs (Cyclobenzaprine hcl) .Marland Kitchen.. 1po three times a day 4)  Plavix 75 Mg Tabs (Clopidogrel bisulfate) .Marland Kitchen.. 1 by mouth once daily 5)  Clonazepam 1 Mg Tabs (Clonazepam) .Marland Kitchen.. 1po three times a day as needed  - to refill Aug 11, 2009 6)  Percocet 10-325 Mg Tabs (Oxycodone-acetaminophen) .Marland Kitchen.. 1 by mouth four times per day as needed for pain - to fill Dec 23, 2009 7)  Flomax 0.4 Mg Caps (Tamsulosin hcl) .Marland Kitchen.. 1 by mouth every morning and 1 by mouth every night 8)  Celebrex 100 Mg Caps (Celecoxib) .Marland Kitchen.. 1 by mouth two times a day 9)  Omeprazole 40 Mg Cpdr (Omeprazole) .Marland Kitchen.. 1 by mouth am and 1by mouth pm 10)  Enalapril Maleate 5 Mg Tabs (Enalapril maleate) .Marland Kitchen.. 1 by mouth once daily 11)  Januvia 50 Mg Tabs (Sitagliptin phosphate) .Marland Kitchen.. 1po once daily 12)  Bupropion Hcl 100 Mg Xr12h-tab (Bupropion hcl) .Marland Kitchen.. 1 by mouth three times a day 13)  Daily Multiple Vitamins Tabs (Multiple vitamin) .Marland Kitchen.. 1 by mouth once daily 14)  Forteo 600 Mcg/2.55ml Soln (Teriparatide (recombinant)) .... Use asd 15)  Thermotabs Tabs (Oral electrolytes) .... 4 to 5 by mouth once daily 16)  Fentanyl 100 Mcg/hr Pt72 (Fentanyl) .Marland Kitchen.. 1 q 3 days - to fill Jan 02, 2010 17)  Fentanyl 25 Mcg/hr Pt72 (Fentanyl) .Marland Kitchen.. 1 q 3 days - to fill Jan 02, 2010 18)  Zonisamide 50 Mg Caps (Zonisamide) .... 3 by mouth at bedtime 19)  Voltaren 1 % Gel (Diclofenac sodium) .... Use asd two times a day  Patient Instructions: 1)  please bring up the possiblity of prolia for osteoporosis treatment at your next appt apr 27 with endocrinology 2)  stop the tizanidine 3)  start the flexeril as prescribed 4)  Please go to Radiology in the basement level for your X-Ray today  5)  Continue all previous medications as before this visit  6)  Please schedule a follow-up appointment in 4 months. Prescriptions: FLEXERIL 5 MG TABS (CYCLOBENZAPRINE HCL) 1po three times a day  #60 x 1   Entered and Authorized by:   Biagio Borg MD   Signed by:   Biagio Borg MD on  11/13/2009   Method used:   Print then Give to Patient   RxID:   [id number]

## 2010-09-15 NOTE — Letter (Signed)
Summary: Activation form and USPS information  Activation form and USPS information   Imported By: Sherri Sear 06/03/2010 12:10:53  _____________________________________________________________________  External Attachment:    Type:   Image     Comment:   External Document

## 2010-09-15 NOTE — Letter (Signed)
Summary: Date Range:11-27-07 to 07-08-09/[facility]  Date Range:11-27-07 to 07-08-09/[facility]   Imported By: Phillis Knack 09/01/2009 11:15:45  _____________________________________________________________________  External Attachment:    Type:   Image     Comment:   External Document

## 2010-09-15 NOTE — Progress Notes (Signed)
  Phone Note Refill Request  on Dec 29, 2009 11:03 AM  Refills Requested: Medication #1:  FORTEO 600 MCG/2.4ML SOLN use asd   Dosage confirmed as above?Dosage Confirmed   Notes: [facility] Initial call taken by: Sharon Seller,  Dec 29, 2009 11:03 AM    Prescriptions: FORTEO 600 MCG/2.4ML SOLN (TERIPARATIDE (RECOMBINANT)) use asd  #1 month x 6   Entered by:   Shirlean Mylar Ewing   Authorized by:   Biagio Borg MD   Signed by:   Sharon Seller on 12/29/2009   Method used:   Faxed to ...       [facility] [address] [phone]* (retail)       [address]       [place], [place]  [address]       Ph: [phone]       Fax: [phone]   [place]:   [phone]

## 2010-09-15 NOTE — Letter (Signed)
Summary: [name]   Imported By: Phillis Knack 09/13/2009 09:56:54  _____________________________________________________________________  External Attachment:    Type:   Image     Comment:   External Document

## 2010-09-15 NOTE — Letter (Signed)
Summary: Letter regarding Rx/Patient  Letter regarding Rx/Patient   Imported By: Phillis Knack 04/01/2010 11:51:04  _____________________________________________________________________  External Attachment:    Type:   Image     Comment:   External Document

## 2010-09-15 NOTE — Assessment & Plan Note (Signed)
Summary: 6 MO ROV /NWS #   Vital Signs:  Patient profile:   65 year old male Height:      72 inches Weight:      221.50 pounds BMI:     30.15 O2 Sat:      96 % on Room air Temp:     98.1 degrees F oral Pulse rate:   78 / minute BP sitting:   118 / 68  (left arm) Cuff size:   large  Vitals Entered By: Shirlean Mylar Ewing CMA (AAMA) (March 13, 2010 1:10 PM)  O2 Flow:  Room air CC: 6 month ROV/RE   CC:  6 month ROV/RE.  History of Present Illness: here to f/u; overall doing well;  fell in the bathtub 2 days ago; thinks may have fx bone in the spine - pain level about 5/10, constant, dull, better to lie on the left sde, no bowel or bladder changes,  No  wt loss, night sweats, loss of appetite or other constitutional symptoms,  Deep breaths make it worse, and no LE pain/weak/numb. Worse to get up from chair as well.   Metanx started recently seemed to make neuropathy discomfort worse.  Pt denies CP, sob, doe, wheezing, orthopnea, pnd, worsening LE edema, palps, dizziness or syncope  Pt denies new neuro symptoms such as headache, facial or extremity weakness Pt denies polydipsia, polyuria, or low sugar symptoms such as shakiness improved with eating.  Overall good compliance with meds, trying to follow low chol, DM diet, wt stable, little excercise however   incidently with acute onset 3 days mild to mod facial pain behind the nose, with fever, greenish congestion, headache and malaise, without ST, cough or wheezing.    Preventive Screening-Counseling & Management      Drug Use:  no.    Problems Prior to Update: 1)  Back Pain  (ICD-724.5) 2)  Hyponatremia, Chronic  (ICD-276.1) 3)  Preventive Health Care  (ICD-V70.0) 4)  Special Screening Malig Neoplasms Other Sites  (ICD-V76.49) 5)  Fatigue  (ICD-780.79) 6)  Preoperative Examination  (ICD-V72.84) 7)  Diabetic Peripheral Neuropathy  (ICD-250.60) 8)  Fibromyalgia  (ICD-729.1) 9)  Sinusitis, Chronic  (ICD-473.9) 10)  Peripheral Vascular  Disease  (ICD-443.9) 11)  Visual Acuity, Decreased, Left Eye  (ICD-369.9) 12)  Unspecified Vitamin D Deficiency  (ICD-268.9) 13)  Nephrolithiasis  (ICD-592.0) 14)  Hyponatremia, Chronic  (ICD-276.1) 15)  Chronic Right Facial Pain/numb Due To Mult Sinus Surguries  () 16)  Biceps Tendon Rupture, Right  (ICD-727.62) 17)  Chronic Pain Syndrome  (ICD-338.4) 18)  Hyperlipidemia  (ICD-272.4) 19)  Benign Prostatic Hypertrophy  (ICD-600.00) 20)  Depression  (ICD-311) 21)  Anxiety  (ICD-300.00) 22)  Diabetes Mellitus, Type II  (ICD-250.00) 23)  Gerd  (ICD-530.81) 24)  Osteoporosis  (ICD-733.00) 25)  Anemia-nos  (ICD-285.9) 26)  Hypertension  (ICD-401.9) 27)  Disc Disease, Lumbar  (ICD-722.52)  Medications Prior to Update: 1)  Simvastatin 40 Mg Tabs (Simvastatin) .Marland Kitchen.. 1 By Mouth Once Daily 2)  Vitamin D 1000 Unit Tabs (Cholecalciferol) .Marland Kitchen.. 1 By Mouth Once Daily 3)  Flexeril 5 Mg Tabs (Cyclobenzaprine Hcl) .Marland Kitchen.. 1po Three Times A Day 4)  Plavix 75 Mg Tabs (Clopidogrel Bisulfate) .Marland Kitchen.. 1 By Mouth Once Daily 5)  Clonazepam 1 Mg Tabs (Clonazepam) .Marland Kitchen.. 1po Three Times A Day As Needed  - To Refill February 02, 2010 6)  Percocet 10-325 Mg Tabs (Oxycodone-Acetaminophen) .Marland Kitchen.. 1 By Mouth Four Times Per Day As Needed For Pain - To [facility] February 21, 2010 7)  Flomax 0.4 Mg Caps (Tamsulosin Hcl) .Marland Kitchen.. 1 By Mouth Every Morning and 1 By Mouth Every Night 8)  Celebrex 100 Mg Caps (Celecoxib) .Marland Kitchen.. 1 By Mouth Two Times A Day 9)  Omeprazole 40 Mg Cpdr (Omeprazole) .Marland Kitchen.. 1 By Mouth Am and 1by Mouth Pm 10)  Enalapril Maleate 5 Mg Tabs (Enalapril Maleate) .Marland Kitchen.. 1 By Mouth Once Daily 11)  Januvia 50 Mg Tabs (Sitagliptin Phosphate) .Marland Kitchen.. 1po Once Daily 12)  Bupropion Hcl 100 Mg Xr12h-Tab (Bupropion Hcl) .Marland Kitchen.. 1 By Mouth Three Times A Day 13)  Daily Multiple Vitamins  Tabs (Multiple Vitamin) .Marland Kitchen.. 1 By Mouth Once Daily 14)  Forteo 600 Mcg/2.29ml Soln (Teriparatide (Recombinant)) .... Use Asd 15)  Thermotabs  Tabs (Oral Electrolytes)  .... 4 To 5 By Mouth Once Daily 16)  Fentanyl 100 Mcg/hr Pt72 (Fentanyl) .Marland Kitchen.. 1 Q 3 Days - To Fill March 03, 2010 17)  Fentanyl 25 Mcg/hr Pt72 (Fentanyl) .Marland Kitchen.. 1 Q 3 Days - To Fill March 03, 2010 18)  Zonisamide 50 Mg Caps (Zonisamide) .... 3 By Mouth At Bedtime 19)  Voltaren 1 % Gel (Diclofenac Sodium) .... Use Asd Two Times A Day  Current Medications (verified): 1)  Simvastatin 40 Mg Tabs (Simvastatin) .Marland Kitchen.. 1 By Mouth Once Daily 2)  Vitamin D 1000 Unit Tabs (Cholecalciferol) .Marland Kitchen.. 1 By Mouth Once Daily 3)  Flexeril 5 Mg Tabs (Cyclobenzaprine Hcl) .Marland Kitchen.. 1po Three Times A Day 4)  Plavix 75 Mg Tabs (Clopidogrel Bisulfate) .Marland Kitchen.. 1 By Mouth Once Daily 5)  Clonazepam 1 Mg Tabs (Clonazepam) .Marland Kitchen.. 1po Three Times A Day As Needed  - To Refill February 02, 2010 6)  Percocet 10-325 Mg Tabs (Oxycodone-Acetaminophen) .Marland Kitchen.. 1 By Mouth Four Times Per Day As Needed For Pain - To Fill February 21, 2010 7)  Flomax 0.4 Mg Caps (Tamsulosin Hcl) .Marland Kitchen.. 1 By Mouth Every Morning and 1 By Mouth Every Night 8)  Celebrex 100 Mg Caps (Celecoxib) .Marland Kitchen.. 1 By Mouth Two Times A Day 9)  Omeprazole 40 Mg Cpdr (Omeprazole) .Marland Kitchen.. 1 By Mouth Am and 1by Mouth Pm 10)  Enalapril Maleate 5 Mg Tabs (Enalapril Maleate) .Marland Kitchen.. 1 By Mouth Once Daily 11)  Januvia 50 Mg Tabs (Sitagliptin Phosphate) .Marland Kitchen.. 1po Once Daily 12)  Bupropion Hcl 100 Mg Xr12h-Tab (Bupropion Hcl) .Marland Kitchen.. 1 By Mouth Three Times A Day 13)  Daily Multiple Vitamins  Tabs (Multiple Vitamin) .Marland Kitchen.. 1 By Mouth Once Daily 14)  Forteo 600 Mcg/2.64ml Soln (Teriparatide (Recombinant)) .... Use Asd 15)  Thermotabs  Tabs (Oral Electrolytes) .... 4 To 5 By Mouth Once Daily 16)  Fentanyl 100 Mcg/hr Pt72 (Fentanyl) .Marland Kitchen.. 1 Q 3 Days - To Fill Jun 01, 2010 17)  Fentanyl 25 Mcg/hr Pt72 (Fentanyl) .Marland Kitchen.. 1 Q 3 Days - To Fill Jun 01, 2010 18)  Zonisamide 50 Mg Caps (Zonisamide) .... 3 By Mouth At Bedtime 19)  Voltaren 1 % Gel (Diclofenac Sodium) .... Use Asd Two Times A Day 20)  Metanx 3-35-2 Mg Tabs  (L-Methylfolate-B6-B12) .Marland Kitchen.. 1 By Mouth Two Times A Day 21)  Erythromycin Base 500 Mg Tabs (Erythromycin Base) .Marland Kitchen.. 1 By Mouth Four Times Per Day  Allergies (verified): 1)  ! Sulfa 2)  ! 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 GERD Diabetes mellitus, type II Anxiety Depression Benign prostatic hypertrophy Hyperlipidemia chronic pain syndrome hyponatremia, chronic  Past Surgical History: s/p right hand fracture 1969 s/p left ankle ganglion cyst 1976 s/p lumbar disc surgury 1981, 2004 s/p multiple sinus surguries x 8 - last 1997 with obliteration s/o left cataract 1996 s/p right cataract 1997 s/p left foot surgury 1998 s/p right foot surgury/fracture 1999 s/p left CTS 2006 s/p right  CTS 2007 s/p left wrist/hand fusion 2008 s/p left foot open repair jones fracture 5th metatarsal 2010 s/p right rotater cuff 2006, then repeat 2011 - Dr Theda Sers s/p vocal surgury 1996  Social History: Reviewed history from 08/05/2009 and no changes required. 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  , mild ill  Head:  normocephalic and atraumatic.   Eyes:  vision grossly intact, pupils equal, and pupils round.   Ears:  bilat tm's red, sinus  tender bilat Nose:  nasal dischargemucosal pallor and mucosal edema.   Mouth:  pharyngeal erythema and fair dentition.   Neck:  supple and cervical lymphadenopathy.   Lungs:  normal respiratory effort and normal breath sounds.   Heart:  normal rate and regular rhythm.   Abdomen:  soft, non-tender, and normal bowel sounds.   Msk:  spine midline tender lower thoracic wihtout paravertebral sweling, tender Extremities:  no edema, no erythema  Neurologic:  cranial nerves II-XII intact and strength normal in all extremities.     Impression & Recommendations:  Problem # 1:  SINUSITIS, ACUTE (ICD-461.9)  treat as above, f/u any worsening signs or symptoms   His updated medication list for this problem includes:    Erythromycin Base 500 Mg Tabs (Erythromycin base) .Marland Kitchen... 1 by mouth four times per day  Problem # 2:  OSTEOPOROSIS (ICD-733.00)  His updated medication list for this problem includes:    Forteo 600 Mcg/2.25ml Soln (Teriparatide (recombinant)) ..... Use asd pt plans to cont with [facility] with tx through april 2012, then change to IV reclast;  I asked him to bring up the iea of prolia to consider   Problem # 3:  BACK PAIN (ICD-724.5)  His updated medication list for this problem includes:    Flexeril 5 Mg Tabs (Cyclobenzaprine hcl) .Marland Kitchen... 1po three times a day    Percocet 10-325 Mg Tabs (Oxycodone-acetaminophen) .Marland Kitchen... 1 by mouth four times per day as needed for pain - to fill February 21, 2010    Celebrex 100 Mg Caps (Celecoxib) .Marland Kitchen... 1 by mouth two times a day    Fentanyl 100 Mcg/hr Pt72 (Fentanyl) .Marland Kitchen... 1 q 3 days - to fill Jun 01, 2010    Fentanyl 25 Mcg/hr Pt72 (Fentanyl) .Marland Kitchen... 1 q 3 days - to fill Jun 01, 2010 acute on chronic , for repeat films today - thoracic only., Continue all previous medications as before this visit   Orders: T-Thoracic Spine 2 Views [id number])  Problem # 4:  DIABETES MELLITUS, TYPE II (ICD-250.00)  His updated medication list  for this problem includes:     Enalapril Maleate 5 Mg Tabs (Enalapril maleate) .Marland Kitchen... 1 by mouth once daily    Januvia 50 Mg Tabs (Sitagliptin phosphate) .Marland Kitchen... 1po once daily  Labs Reviewed: Creat: 0.7 (10/17/2009)    Reviewed HgBA1c results: 7.5 (10/17/2009)  7.2 (08/05/2009) stable overall by hx and exam, ok to continue meds/tx as is , Pt to cont DM diet, excercise, wt loss efforts; to check labs today   Orders: TLB-BMP (Basic Metabolic Panel-BMET) (99991111) TLB-Lipid Panel (80061-LIPID) TLB-A1C / Hgb A1C (Glycohemoglobin) (83036-A1C)  Complete Medication List: 1)  Simvastatin 40 Mg Tabs (Simvastatin) .Marland Kitchen.. 1 by mouth once daily 2)  Vitamin D 1000 Unit Tabs (Cholecalciferol) .Marland Kitchen.. 1 by mouth once daily 3)  Flexeril 5 Mg Tabs (Cyclobenzaprine hcl) .Marland Kitchen.. 1po three times a day 4)  Plavix 75 Mg Tabs (Clopidogrel bisulfate) .Marland Kitchen.. 1 by mouth once daily 5)  Clonazepam 1 Mg Tabs (Clonazepam) .Marland Kitchen.. 1po three times a day as needed  - to refill February 02, 2010 6)  Percocet 10-325 Mg Tabs (Oxycodone-acetaminophen) .Marland Kitchen.. 1 by mouth four times per day as needed for pain - to fill February 21, 2010 7)  Flomax 0.4 Mg Caps (Tamsulosin hcl) .Marland Kitchen.. 1 by mouth every morning and 1 by mouth every night 8)  Celebrex 100 Mg Caps (Celecoxib) .Marland Kitchen.. 1 by mouth two times a day 9)  Omeprazole 40 Mg Cpdr (Omeprazole) .Marland Kitchen.. 1 by mouth am and 1by mouth pm 10)  Enalapril Maleate 5 Mg Tabs (Enalapril maleate) .Marland Kitchen.. 1 by mouth once daily 11)  Januvia 50 Mg Tabs (Sitagliptin phosphate) .Marland Kitchen.. 1po once daily 12)  Bupropion Hcl 100 Mg Xr12h-tab (Bupropion hcl) .Marland Kitchen.. 1 by mouth three times a day 13)  Daily Multiple Vitamins Tabs (Multiple vitamin) .Marland Kitchen.. 1 by mouth once daily 14)  Forteo 600 Mcg/2.24ml Soln (Teriparatide (recombinant)) .... Use asd 15)  Thermotabs Tabs (Oral electrolytes) .... 4 to 5 by mouth once daily 16)  Fentanyl 100 Mcg/hr Pt72 (Fentanyl) .Marland Kitchen.. 1 q 3 days - to fill Jun 01, 2010 17)  Fentanyl 25 Mcg/hr Pt72 (Fentanyl) .Marland Kitchen.. 1 q 3 days - to fill Jun 01, 2010 18)  Zonisamide 50 Mg Caps (Zonisamide) .... 3 by mouth at bedtime 19)  Voltaren 1 % Gel (Diclofenac sodium) .... Use asd two times a day 20)  Metanx 3-35-2 Mg Tabs (L-methylfolate-b6-b12) .Marland Kitchen.. 1 by mouth two times a day 21)  Erythromycin Base 500 Mg Tabs (Erythromycin base) .Marland Kitchen.. 1 by mouth four times per day  Patient Instructions: 1)  Please take all new medications as prescribed 2)  Continue all previous medications as before this visit  3)  Please go to the Lab in the basement for your blood and/or urine tests today 4)  You are given the pain medicatin refills;  remember we have to very strict about the dates to fill; the last prescriptioins were for july 19;  so the next should be filled aug 18 5)  Please schedule a follow-up appointment in feb 2012 for your "yearly medicare exam" Prescriptions: FENTANYL 100 MCG/HR PT72 (FENTANYL) 1 q 3 days - to fill Jun 01, 2010  #10 x 0   Entered and Authorized by:   Biagio Borg MD   Signed by:   Biagio Borg MD on 03/13/2010   Method used:   Print then Give to Patient   RxID:   [id number] FENTANYL 100 MCG/HR PT72 (FENTANYL) 1 q 3 days - to fill sept 17, 2011  #  10 x 0   Entered and Authorized by:   Biagio Borg MD   Signed by:   Biagio Borg MD on 03/13/2010   Method used:   Print then Give to Patient   RxID:   [id number] FENTANYL 100 MCG/HR PT72 (FENTANYL) 1 q 3 days - to fill Apr 02, 2010  #10 x 0   Entered and Authorized by:   Biagio Borg MD   Signed by:   Biagio Borg MD on 03/13/2010   Method used:   Print then Give to Patient   RxID:   [id number] FENTANYL 25 MCG/HR PT72 (FENTANYL) 1 q 3 days - to fill Jun 01, 2010  #10 x 0   Entered and Authorized by:   Biagio Borg MD   Signed by:   Biagio Borg MD on 03/13/2010   Method used:   Print then Give to Patient   RxID:   [id number] FENTANYL 25 MCG/HR PT72 (FENTANYL) 1 q 3 days - to fill sept 17, 2011  #10 x 0   Entered and Authorized by:   Biagio Borg MD    Signed by:   Biagio Borg MD on 03/13/2010   Method used:   Print then Give to Patient   RxID:   [id number] FENTANYL 25 MCG/HR PT72 (FENTANYL) 1 q 3 days - to fill Apr 02, 2010  #10 x 0   Entered and Authorized by:   Biagio Borg MD   Signed by:   Biagio Borg MD on 03/13/2010   Method used:   Print then Give to Patient   RxIDQR:[id number] ERYTHROMYCIN BASE 500 MG TABS (ERYTHROMYCIN BASE) 1 by mouth four times per day  #40 x 0   Entered and Authorized by:   Biagio Borg MD   Signed by:   Biagio Borg MD on 03/13/2010   Method used:   Print then Give to Patient   RxID:   [id number]

## 2010-09-15 NOTE — Progress Notes (Signed)
  Phone Note Call from Patient   Caller: Patient Summary of Call: Patient walked in office today requesting refill on Clonazepam and Zonisamide sent to [place]. He stated we had denied refill.  Pt. can be reached at [phone] or [phone] Initial call taken by: Robin Ewing CMA Deborra Medina),  March 03, 2010 11:33 AM  Follow-up for Phone Call        no on the clonazepam, as he still has refills ,  and  I normally do not prescribe the zonisamide  -   Does have his neurologist to do this?   Follow-up by: Biagio Borg MD,  March 03, 2010 1:12 PM  Additional Follow-up for Phone Call Additional follow up Details #1::        called pt left msg. to call back Additional Follow-up by: Robin Ewing CMA Deborra Medina),  March 03, 2010 1:49 PM    Additional Follow-up for Phone Call Additional follow up Details #2::    Patient came by office and did get his refills on clonazepam, but he does not have a neurologist that prescribes Zonisamide and would like a refill. Please Advice, he uses [facility] Follow-up by: Shirlean Mylar Ewing CMA Deborra Medina),  March 03, 2010 2:31 PM  Additional Follow-up for Phone Call Additional follow up Details #3:: Details for Additional Follow-up Action Taken: ok  - will do per emr Additional Follow-up by: Biagio Borg MD,  March 03, 2010 4:11 PM  Prescriptions: ZONISAMIDE 50 MG CAPS (ZONISAMIDE) 3 by mouth at bedtime  #90 x 5   Entered and Authorized by:   Biagio Borg MD   Signed by:   Biagio Borg MD on 03/03/2010   Method used:   Electronically to        [facility] [address] # [phone]* (retail)       [address]       [place], [place]  [address]       Ph: [phone]       Fax: [phone]   [place]:   [phone]

## 2010-09-15 NOTE — Progress Notes (Signed)
Summary: Paperwork  Phone Note Call from Patient Call back at [facility] Phone [phone]   Caller: Patient Call For: Biagio Borg MD Summary of Call: Patient dropped off a form to be completed for [facility] by Dr. Jenny Reichmann. Please fax the form when completed. Initial call taken by: Katy Apo,  September 24, 2009 10:49 AM  Follow-up for Phone Call        please call pt - can we do a heart stress test prior to surgury - should be able to get done early next wk most likely if we order now;  this would be to show that he could have a 2 hr surgury without high risk of heart attack Follow-up by: Biagio Borg MD,  September 24, 2009 12:48 PM  Additional Follow-up for Phone Call Additional follow up Details #1::        left message on machine for pt to return my call  Additional Follow-up by: Crissie Sickles, [place],  September 24, 2009 12:58 PM  New Problems: PREOPERATIVE EXAMINATION (ICD-V72.84)   Additional Follow-up for Phone Call Additional follow up Details #2::    pt says that he cannot do a stress test because he has trouble with his shoulder and back. he says he also has a blister on his foot. please advise? Follow-up by: Crissie Sickles, [place],  September 25, 2009 11:41 AM  Additional Follow-up for Phone Call Additional follow up Details #3:: Details for Additional Follow-up Action Taken: we can do the the type of test where he does not have to walk (is done with medication);  we will need to order this so that he can be safe with the surgury - done per EMR, and he should be called shortly Additional Follow-up by: Biagio Borg MD,  September 25, 2009 1:11 PM  New Problems: PREOPERATIVE EXAMINATION (ICD-V72.84) pt informed via VM and told to call back with any further questions or concerns, pt also advised to expect call from [facility]. Crissie Sickles, [place]  September 25, 2009 2:19 PM

## 2010-09-15 NOTE — Letter (Signed)
Summary: [place]   Imported By: Phillis Knack 09/13/2009 09:54:53  _____________________________________________________________________  External Attachment:    Type:   Image     Comment:   External Document

## 2010-09-15 NOTE — Progress Notes (Signed)
Summary: Rx refill req  Phone Note Refill Request   Refills Requested: Medication #1:  PERCOCET 10-325 MG TABS 1 by mouth four times per day as needed for pain - to fill may 10   Last Refilled: 12/23/2009  Medication #2:  FENTANYL 25 MCG/HR PT72 1 q 3 days - to fill may 20   Last Refilled: 01/02/2010 pt states that he has appt with [facility] 7/31 and is requesting refill to last untill appt.  Initial call taken by: Crissie Sickles, [place],  Jan 08, 2010 11:21 AM  Follow-up for Phone Call        all done hardcopy to LIM side B - dahlia  Follow-up by: Biagio Borg MD,  Jan 08, 2010 5:25 PM  Additional Follow-up for Phone Call Additional follow up Details #1::        pt informed, Rx in cabinet for pt pick up Additional Follow-up by: Crissie Sickles, [place],  Jan 09, 2010 8:09 AM    New/Updated Medications: PERCOCET 10-325 MG TABS (OXYCODONE-ACETAMINOPHEN) 1 by mouth four times per day as needed for pain - to fill January 22, 2010 PERCOCET 10-325 MG TABS (OXYCODONE-ACETAMINOPHEN) 1 by mouth four times per day as needed for pain - to fill February 21, 2010 FENTANYL 100 MCG/HR PT72 (FENTANYL) 1 q 3 days - to fill February 01, 2010 FENTANYL 100 MCG/HR PT72 (FENTANYL) 1 q 3 days - to fill March 03, 2010 FENTANYL 25 MCG/HR PT72 (FENTANYL) 1 q 3 days - to fill March 03, 2010 FENTANYL 25 MCG/HR PT72 (FENTANYL) 1 q 3 days - to fill Jan 01, 2010 FENTANYL 25 MCG/HR PT72 (FENTANYL) 1 q 3 days - to fill February 01, 2010 Prescriptions: FENTANYL 100 MCG/HR PT72 (FENTANYL) 1 q 3 days - to fill March 03, 2010  #10 x 0   Entered and Authorized by:   Biagio Borg MD   Signed by:   Biagio Borg MD on 01/08/2010   Method used:   Print then Give to Patient   RxID:   [id number] FENTANYL 100 MCG/HR PT72 (FENTANYL) 1 q 3 days - to fill February 01, 2010  #10 x 0   Entered and Authorized by:   Biagio Borg MD   Signed by:   Biagio Borg MD on 01/08/2010   Method used:   Print then Give to Patient   RxID:    [id number] FENTANYL 25 MCG/HR PT72 (FENTANYL) 1 q 3 days - to fill March 03, 2010  #10 x 0   Entered and Authorized by:   Biagio Borg MD   Signed by:   Biagio Borg MD on 01/08/2010   Method used:   Print then Give to Patient   RxID:   [id number] FENTANYL 25 MCG/HR PT72 (FENTANYL) 1 q 3 days - to fill February 01, 2010  #10 x 0   Entered and Authorized by:   Biagio Borg MD   Signed by:   Biagio Borg MD on 01/08/2010   Method used:   Print then Give to Patient   RxID:   [id number] FENTANYL 25 MCG/HR PT72 (FENTANYL) 1 q 3 days - to fill Jan 01, 2010  #10 x 0   Entered and Authorized by:   Biagio Borg MD   Signed by:   Biagio Borg MD on 01/08/2010   Method used:   Print then Give to Patient   RxID:   [id number] PERCOCET 10-325 MG TABS (OXYCODONE-ACETAMINOPHEN) 1 by  mouth four times per day as needed for pain - to fill February 21, 2010  #120 x 0   Entered and Authorized by:   Biagio Borg MD   Signed by:   Biagio Borg MD on 01/08/2010   Method used:   Print then Give to Patient   RxID:   [id number] PERCOCET 10-325 MG TABS (OXYCODONE-ACETAMINOPHEN) 1 by mouth four times per day as needed for pain - to fill January 22, 2010  #120 x 0   Entered and Authorized by:   Biagio Borg MD   Signed by:   Biagio Borg MD on 01/08/2010   Method used:   Print then Give to Patient   RxID:   [id number]

## 2010-09-15 NOTE — Letter (Signed)
Summary: [facility]   Imported By: Phillis Knack 01/08/2010 12:25:52  _____________________________________________________________________  External Attachment:    Type:   Image     Comment:   External Document

## 2010-09-15 NOTE — Letter (Addendum)
Summary: [facility] FROM THE PRACTICE  [facility] [address]   [place], [place] [address]   Phone: [phone]  Fax: [phone]    03/30/2010  Rodriquez Paulick [address] [place], [place]  [address]  Dear Mr. BLATZ,       This letter is to make you aware that I can no   longer serve as your primary care physician, and you  are dismissed as an active patient.  There has been  difficulty with the narcotic prescriptions, and some  threatening language in the letter you sent regarding  those prescriptions.  We will remain available for 30  from the date of this letter for medication refills   and emergency care.  Please feel free to request a copy  of your medical information from our Medical Records  Department to assist in transferring your care.  If you  need assistance with determing a possible new physician,  please contact the [facility]erral [facility].        Sincerely,   Cathlean Cower MD  Appended Document: Letter sent Indicators placed in IDX and EMR. Letter sent out by Certified Mail.  Appended Document: Unsuccessful delivery-sent 1st class mail Certified letter returned after three unsuccessful attempts. Letter resent by 1st class mail.

## 2010-09-15 NOTE — Progress Notes (Signed)
Summary: PAIN MEDICATION  Phone Note Call from Patient   Summary of Call: Pt left a note at the front desk. He will be out of his pain medications early, this monday the 10th. This is due to the recent injury of his bicep. He would like another rx until refill is due on the 21st.   Initial call taken by: Charlsie Quest, [place],  August 22, 2009 2:05 PM  Follow-up for Phone Call        ok for qid dosing for the next month rx only  - should be able to decrese again after that - BUT he will need to bring in his jan and feb prescriptions already done to exchange for the new ones Follow-up by: Biagio Borg MD,  August 22, 2009 2:19 PM  Additional Follow-up for Phone Call Additional follow up Details #1::        left message on machine to call back to office. Additional Follow-up by: Ernestene Mention,  August 22, 2009 2:30 PM    Additional Follow-up for Phone Call Additional follow up Details #2::    Patient notified and will come by on Monday. Follow-up by: Ernestene Mention,  August 22, 2009 4:40 PM  Additional Follow-up for Phone Call Additional follow up Details #3:: Details for Additional Follow-up Action Taken: noted Additional Follow-up by: Biagio Borg MD,  August 22, 2009 5:04 PM

## 2010-09-15 NOTE — Progress Notes (Signed)
Summary: Medication  Phone Note From Pharmacy   Caller: Lowanda Foster Dr. # [phone]* Summary of Call: pharmacy called stating drug interaction between erthromycin and simvastatin and fantanyl for this patient. Do you still want them to fill this prescription?( Erthromycin 500 base)? Initial call taken by: Robin Ewing CMA Deborra Medina),  March 16, 2010 9:19 AM  Follow-up for Phone Call        ok to fill this time only Follow-up by: Biagio Borg MD,  March 16, 2010 12:46 PM  Additional Follow-up for Phone Call Additional follow up Details #1::        called [facility] and spoke to the pharmacist and informed ok to fill this time only. Additional Follow-up by: Robin Ewing CMA Deborra Medina),  March 16, 2010 1:38 PM

## 2010-09-15 NOTE — Progress Notes (Signed)
Summary: REFILL ON ENALAPRIL AND PLAVIX  Phone Note Call from Patient Call back at Home Phone [phone]   Caller: [place] Summary of Call: PT IS REQUESTING ENALAPRIL 5 MG WHICH WAS DENIED.  HE WAS PUT ON IT AND PLAVIX 75 MG AFTER A STROKE IN HIS LEFT EYE. HE WANTS BOTH SENT TO [facility] [place]  [id number]. PT'S PHONE: [phone]   CELL [phone]   PLEASE CALL PT. Initial call taken by: Glena Norfolk,  February 03, 2010 10:17 AM    Prescriptions: ENALAPRIL MALEATE 5 MG TABS (ENALAPRIL MALEATE) 1 by mouth once daily  #30 x 5   Entered by:   Crissie Sickles, CMA   Authorized by:   Biagio Borg MD   Signed by:   Crissie Sickles, CMA on 02/03/2010   Method used:   Electronically to        [facility] # [phone]* (retail)       [address]       [place], [place]  [address]       Ph: [phone]       Fax: [phone]   RxID:   [id number] PLAVIX 75 MG TABS (CLOPIDOGREL BISULFATE) 1 by mouth once daily  #30 x 5   Entered by:   Crissie Sickles, CMA   Authorized by:   Biagio Borg MD   Signed by:   Crissie Sickles, CMA on 02/03/2010   Method used:   Electronically to        [facility] # [phone]* (retail)       [address]       [place], [place]  [address]       Ph: [phone]       Fax: [phone]   [place]:   [phone]

## 2010-09-15 NOTE — Progress Notes (Signed)
Summary: Nuclear Pre-Procedure  Phone Note Outgoing Call   Call placed by: Perrin Maltese, EMT-P,  September 29, 2009 4:17 PM Summary of Call: Reviewed information on Myoview Information Sheet (see scanned document for further details).  Spoke with patient.     Nuclear Med Background Indications for Stress Test: Evaluation for Ischemia, Surgical Clearance  Indications Comments: Pending (R) Bicep tear       Nuclear Pre-Procedure Cardiac Risk Factors: CVA, Family History - CAD, History of Smoking, Lipids, NIDDM, PVD Height (in): 69  Nuclear Med Study Referring MD:  J.John

## 2010-09-15 NOTE — Assessment & Plan Note (Signed)
Summary: Cardiology Nuclear Study  Nuclear Med Background Indications for Stress Test: Evaluation for Ischemia, Surgical Clearance  Indications Comments: Pending Sinus surgery at [facility] on 10/14/09 and (R) Bicep tear by Dr. Hart Robinsons TBA.   History Comments: NO DOCUMENTED CAD  Symptoms: Chest Pain, Chest Pain with Exertion, Dizziness, DOE, Fatigue, Light-Headedness, SOB  Symptoms Comments: Last episode of [id number] had an episode of significant CP after walking into house with SOB about 1 month ago that lasted for about 15 minutes; relieved with rest and pain medication.  He did not go to hospital for symptoms.   Nuclear Pre-Procedure Cardiac Risk Factors: Claudication, CVA, Family History - CAD, History of Smoking, Lipids, NIDDM, PVD Caffeine/Decaff Intake: yes NPO After: 7:30 AM Lungs: Clear.  O2 Sat 95% on RA. IV 0.9% NS with Angio Cath: 18g     IV Site: (R) Forearm IV Started by: Eliezer Lofts EMT-P Chest Size (in) 48     Height (in): 73 Weight (lb): 223 BMI: 29.53  Nuclear Med Study 1 or 2 day study:  1 day     Stress Test Type:  Dobutamine Reading MD:  Dola Argyle, MD     Referring MD:  Cathlean Cower, MD Resting Radionuclide:  Technetium 66m Tetrofosmin     Resting Radionuclide Dose:  11.0 mCi  Stress Radionuclide:  Technetium 14m Tetrofosmin     Stress Radionuclide Dose:  33.0 mCi   Stress Protocol Exercise Time (min):  15:00 min     Max HR:  137 bpm     Predicted Max HR:  A999333 bpm  Max Systolic BP: 123XX123 mm Hg     Percent Max HR:  87.26 %Rate Pressure Product:  [id number]  Dose of Atropine: 0.25 mg  Dose of Dobutamine:  40 mcg/kg/min (at max HR)  Stress Test Technologist:  Valetta Fuller CMA-N     Nuclear Technologist:  Mariann Laster Deal RT-N  Rest Procedure  Myocardial perfusion imaging was performed at rest 45 minutes following the intravenous administration of Myoview Technetium 36m Tetrofosmin.  Stress Procedure  The patient was to receive lexiscan initially, but due to him having  two cups of coffee this morning, his test had to be changed to dobutamine.  He then received IV dobutamine and 0.25 mg IV atropine. There were no significant changes with infusion, only occasional PVC's and PAC's.   Myoview was injected at peak heart rate and quantitative spect images were obtained after a 45 minute delay.  QPS Raw Data Images:  Patient motion noted; appropriate software correction applied. Stress Images:  No diagnostic abnormalities Rest Images:  Same as stress Subtraction (SDS):  No evidence of ischemia. Transient Ischemic Dilatation:  1.05  (Normal <1.22)  Lung/Heart Ratio:  .35  (Normal <0.45)  Quantitative Gated Spect Images QGS EDV:  137 ml QGS ESV:  61 ml QGS EF:  55 % QGS cine images:  NORMAL MOTION  Findings Normal nuclear study      Overall Impression  Exercise Capacity: Dobutamine stress BP Response: Normal blood pressure response. Clinical Symptoms: No chest pain ECG Impression: No significant ST segment change suggestive of ischemia. Overall Impression: Normal stress nuclear study.  Appended Document: Cardiology Nuclear Study LMOPT - labs negative, normal, or stable  - No Acute problem  - form to be signed for OK for surgury and forwarded to ortho

## 2010-09-15 NOTE — Progress Notes (Signed)
  Phone Note Refill Request  on December 03, 2009 9:58 AM  Refills Requested: Medication #1:  FLOMAX 0.4 MG CAPS 1 by mouth every morning and 1 by mouth every night   Dosage confirmed as above?Dosage Confirmed   Notes: [facility] Initial call taken by: Sharon Seller,  December 03, 2009 9:59 AM    Prescriptions: FLOMAX 0.4 MG CAPS (TAMSULOSIN HCL) 1 by mouth every morning and 1 by mouth every night  #60 x 10   Entered by:   Sharon Seller   Authorized by:   Biagio Borg MD   Signed by:   Sharon Seller on 12/03/2009   Method used:   Faxed to ...       [id number]* (retail)       [place]       [place], [place]  [address]       Ph: [phone] or [phone]       Fax: [phone]   RxID:   [id number]

## 2010-09-15 NOTE — Progress Notes (Signed)
Summary: Fentanyl (July)  Phone Note Outgoing Call   Call placed by: Crissie Sickles, [place],  March 13, 2010 4:01 PM Call placed to: Pharmacy Summary of Call: Per [facility] pt's last filled Fentanyl patches 100 and 25 on June 26, both of these were dated June 19th. Pharmacist ran a test claim on Rx to see if pt had July Rx filled at another pharmacy and it was approved. Pharmacist says this means that either patient paid out of pocket ($450+) or it was not filled at another pharmacy. Dr. Jenny Reichmann has been advised and stated that pt will need to return all Rxs recieved at [facility] today and he will be given new ones on a Monthly bases as it seems July Rx was lost. Pt advised of same. Initial call taken by: Crissie Sickles, [place],  March 13, 2010 4:06 PM  Follow-up for Phone Call        ok to fill aug rx early, but pt to bring other rx back  we will need to give monthly rx from now on Follow-up by: Biagio Borg MD,  March 13, 2010 4:22 PM  Additional Follow-up for Phone Call Additional follow up Details #1::        Pt advised and original Rx discarded and new Rx given to pt. Additional Follow-up by: Crissie Sickles, [place],  March 13, 2010 5:01 PM    New/Updated Medications: FENTANYL 100 MCG/HR PT72 (FENTANYL) 1 q 3 days - to fill july 29. 2011 FENTANYL 25 MCG/HR PT72 (FENTANYL) 1 q 3 days - to fill March 13, 2010 Prescriptions: FENTANYL 25 MCG/HR PT72 (FENTANYL) 1 q 3 days - to fill March 13, 2010  #10 x 0   Entered by:   Biagio Borg MD   Authorized by:   B LIM Triage Nurse   Signed by:   Biagio Borg MD on 03/13/2010   Method used:   Print then Give to Patient   RxID:   [id number] FENTANYL 100 MCG/HR PT72 (FENTANYL) 1 q 3 days - to fill july 29. 2011  #10 x 0   Entered by:   Biagio Borg MD   Authorized by:   B LIM Triage Nurse   Signed by:   Biagio Borg MD on 03/13/2010   Method used:   Print then Give to Patient   RxID:   [id number]

## 2010-09-15 NOTE — Progress Notes (Signed)
Summary: Rx refill req  Phone Note Call from Patient Call back at Home Phone [phone] Call back at [phone]   Caller: Patient Call For: Dr Jenny Reichmann Summary of Call: Pt wants to pick 1 mos refill on Fentyl patch. Pt does not want sent to pharmacy, he needs to pick up. Please advise Initial call taken by: Denice [place],  March 12, 2010 11:43 AM  Follow-up for Phone Call        Pt informed that he has appt with [facility] tomorrow at 1:30p Follow-up by: Crissie Sickles, [place],  March 12, 2010 11:57 AM

## 2010-09-15 NOTE — Miscellaneous (Signed)
Summary: Designated Party [place] By: Bubba Hales 11/13/2009 12:07:34  _____________________________________________________________________  External Attachment:    Type:   Image     Comment:   External Document

## 2010-09-15 NOTE — Progress Notes (Signed)
Summary: med Refill  Phone Note Refill Request  on December 03, 2009 9:59 AM  Refills Requested: Medication #1:  CLONAZEPAM 1 MG TABS 1po three times a day as needed  - to refill dec 27   Dosage confirmed as above?Dosage Confirmed   Notes: [facility] Initial call taken by: Sharon Seller,  December 03, 2009 10:00 AM  Follow-up for Phone Call        done hardcopy to [place] side B - dahlia   Follow-up by: Biagio Borg MD,  December 03, 2009 10:05 AM  Additional Follow-up for Phone Call Additional follow up Details #1::        RX faxed to pharmacy Additional Follow-up by: Crissie Sickles, [place],  December 03, 2009 10:15 AM    New/Updated Medications: CLONAZEPAM 1 MG TABS (CLONAZEPAM) 1po three times a day as needed  - to refill Dec 03, 2009 Prescriptions: CLONAZEPAM 1 MG TABS (CLONAZEPAM) 1po three times a day as needed  - to refill Dec 03, 2009  #90 x 2   Entered and Authorized by:   Biagio Borg MD   Signed by:   Biagio Borg MD on 12/03/2009   Method used:   Print then Give to Patient   RxID:   [id number]

## 2010-09-15 NOTE — Miscellaneous (Signed)
  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

## 2010-09-15 NOTE — Progress Notes (Signed)
Summary: PA-Erythromycin denied  Phone Note From Pharmacy   Summary of Call: PA-Erythromycin base, called [facility] @ [phone] denied because of interaction with another medicaltion. Please advise. Initial call taken by: Ophelia Charter,  March 19, 2010 2:46 PM  Follow-up for Phone Call        ok to change to azithromycin - zpack x 1 -    please ask pt to fill at local pharmcay as he should not wait for [facility] to send in the mail Follow-up by: Biagio Borg MD,  March 19, 2010 3:42 PM  Additional Follow-up for Phone Call Additional follow up Details #1::        Called pt to inform, per pt he has already pick up Rx for Erythromycin from local pharmacy. Pt does not get Rxs through [facility] and was not aware of any PA needed? Additional Follow-up by: Crissie Sickles, [place],  March 19, 2010 5:02 PM    Additional Follow-up for Phone Call Additional follow up Details #2::    I'm ok with pt taking the emcyin at this point - ok to proceed Follow-up by: Biagio Borg MD,  March 19, 2010 5:50 PM  New/Updated Medications: AZITHROMYCIN 250 MG TABS (AZITHROMYCIN) 2po qd for 1 day, then 1po qd for 4days, then stop Prescriptions: AZITHROMYCIN 250 MG TABS (AZITHROMYCIN) 2po qd for 1 day, then 1po qd for 4days, then stop  #6 x 1   Entered and Authorized by:   Biagio Borg MD   Signed by:   Biagio Borg MD on 03/19/2010   Method used:   Electronically to        [facility] # [phone]* (retail)       [address]       [place],   [address]       Ph: [phone]       Fax: [phone]   [place]:   [phone]

## 2010-09-15 NOTE — Progress Notes (Signed)
Summary: Referral  Phone Note Call from Patient Call back at Home Phone [phone]   Caller: Patient [phone] Summary of Call: Pt came into clinic requesting a letter stating he was referred to Pain clinic. Per [facility] pt was referred to [facility] in 07/2009. After contacting  [facility], [facility] was advised that they do not keep referral going back to Dec of 2010 and if pt wanted a letter stating that he was referred, it would have to be after a new referral has been ordered. Pt has discharged from practice. Please advise on pain referral? Initial call taken by: Crissie Sickles, [place],  April 27, 2010 11:14 AM  Follow-up for Phone Call        as pt has been discharged from the practice, any new pain medication and referrals related to such will need to come from new PCP Follow-up by: Biagio Borg MD,  April 27, 2010 1:03 PM  Additional Follow-up for Phone Call Additional follow up Details #1::        Pt informed and also requested copy of letter he droppedvoff at clinic. Printed an placed in cabinet for pt pick up Additional Follow-up by: Crissie Sickles, CMA,  April 27, 2010 1:34 PM

## 2010-09-15 NOTE — Progress Notes (Signed)
  Phone Note Refill Request Message from:  Fax from Pharmacy on April 06, 2010 12:01 PM  Refills Requested: Medication #1:  OMEPRAZOLE 40 MG CPDR 1 by mouth am and 1by mouth pm   Dosage confirmed as above?Dosage Confirmed   Notes: [facility] Initial call taken by: Shirlean Mylar Ewing CMA ([place]),  April 06, 2010 12:02 PM    Prescriptions: OMEPRAZOLE 40 MG CPDR (OMEPRAZOLE) 1 by mouth am and 1by mouth pm  #60 x 0   Entered by:   Sharon Seller CMA ([place] )   Authorized by:   Biagio Borg MD   Signed by:   Sharon Seller CMA ([place]) on 04/06/2010   Method used:   Faxed to ...       [facility] # [phone]* (retail)       [address]       [place], [place]  [address]       Ph: [phone]       Fax: [phone]   [place]:   [phone]

## 2010-09-15 NOTE — Progress Notes (Signed)
Summary: B-D UFN III shrt pen ndl31G refill  Phone Note Refill Request Message from:  Pharmacy on 12/24/09   Refills Requested: Medication #1:  B-D U/FN III shrt Pen ndl31G   Supply Requested: 100   Last Refilled: 12/21/2009   Notes: Use as directed daily for injection #100 x 1 refill given to pharmacist at [facility].  Kelle Darting CMA  Dec 25, 2009 9:22 AM    Method Requested: Telephone to Pharmacy Initial call taken by: Kelle Darting CMA,  Dec 25, 2009 9:21 AM

## 2010-09-15 NOTE — Letter (Signed)
Summary: Pre-Op Clearance/[facility]  Pre-Op Clearance/[facility]   Imported By: Phillis Knack 10/04/2009 09:58:14  _____________________________________________________________________  External Attachment:    Type:   Image     Comment:   External Document

## 2010-09-15 NOTE — Letter (Signed)
Summary: [employer]  [facility]   Imported By: Bubba Hales 08/25/2009 08:59:14  _____________________________________________________________________  External Attachment:    Type:   Image     Comment:   External Document

## 2010-09-15 NOTE — Letter (Signed)
Summary: [facility]   Imported By: Bubba Hales 11/20/2009 08:52:59  _____________________________________________________________________  External Attachment:    Type:   Image     Comment:   External Document

## 2010-09-16 HISTORY — PX: OTHER SURGICAL HISTORY: SHX169

## 2010-09-18 ENCOUNTER — Ambulatory Visit: Admit: 2010-09-18 | Payer: Self-pay | Admitting: Internal Medicine

## 2010-09-18 ENCOUNTER — Ambulatory Visit: Payer: Self-pay | Admitting: Internal Medicine

## 2010-09-18 NOTE — Discharge Summary (Signed)
[name], [name]                 ACCOUNT NO.:  [id number]  MEDICAL RECORD NO.:  [id number]          PATIENT TYPE:  OIB  LOCATION:  2927                         FACILITY:  [facility]  PHYSICIAN:  Eden Lathe. Einar Gip, MD       DATE OF BIRTH:  12-09-1945  DATE OF ADMISSION:  09/08/2010 DATE OF DISCHARGE:  09/09/2010                              DISCHARGE SUMMARY   DISCHARGE DIAGNOSES: 1. 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 2. Hypotension secondary to vasovagal episode. 3. Hyperlipidemia. 4. Diabetes mellitus type 2 controlled. 5. Hypertension controlled.  RECOMMENDATIONS:  The patient will be discharged home today.  I have arranged outpatient consultation with Dr. Annamarie Major for consideration for aortobifemoral bypass surgery.  We will check his H and H this morning along with BMP and if stable, will be discharged home with home medications.  DISCHARGE MEDICATIONS: 1. Multivitamin p.o. daily. 2. Vitamin D3 OTC 1 p.o. daily. 3. Cyclobenzaprine 10 mg p.o. q.8 h. 4. Zolpidem 10 mg p.o. at bedtime. 5. Forteo injection subcu daily. 6. Flomax 0.4 mg p.o. b.i.d. 7. Zonisamide 100 mg tablet 1-1/2 tablets at bedtime. 8. Celebrex 200 mg 2 tablets p.o. daily. 9. Wellbutrin XL 300 mg p.o. daily. 10.Clonazepam 1 mg p.o. daily. 11.Glimepiride 4 mg p.o. b.i.d. 12.Crestor 10 mg p.o. daily. 13.Pletal 100 mg p.o. b.i.d. 14.Sertraline 100 mg p.o. daily. 15.Methylfolate/methylcobalamin/Pyridoxal 10/15/33 one p.o. b.i.d. 16.Coreg 6.25 mg p.o. b.i.d. 17.Omeprazole 40 mg p.o. b.i.d. 18.Accupril 10 mg p.o. daily, which will be started tomorrow. 19.His Plavix will be held and aspirin 325 mg p.o. daily will be     started.  BRIEF HISTORY AND COURSE IN THE HOSPITAL:  [name] is a diabetic 65- year-old gentleman who has had chronic lower extremity pain and back pain.  He has  been diagnosed with peripheral arterial disease in the past, was recommended medical therapy only.  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  He was seen by me for severe pain in his lower extremities and I felt that this was related to aortoiliac disease, and he underwent lower extremity duplex, which had suggested aortoiliac disease with mildly decreased ABI bilaterally.  He underwent peripheral angiography on September 08, 2010, in the outpatient basis.  At that time, he was found to have long segment complex calcified bilateral common and external iliac artery long segment disease.  He was felt to be not a candidate for percutaneous revascularization, but surgery was a better option.  After access was obtained, the patient had become hypotensive, and this was felt to be vasovagal in nature.  He was resuscitated with fluid, but because of persistent hypertension, he was admitted the hospital for overnight observation.  HOSPITAL COURSE:  His dopamine was eventually weaned off.  He tolerated the procedure well without any complication.  Next day, he remained stable with stable white signs including pulse rate of 56-60 beats per minute regular, respiration rate of 12-14 per minute with  a blood pressure of 101/76 units mmHg.  His heart examination revealed S1 and S2 to be normal without any gallop or murmur.  Chest was clear.  Abdomen was soft.  Right groin site had healed well without any hematoma or pulsatile mass.  I suspect his chronic pain in the lower extremities related to severe peripheral arterial disease.  Further recommendations, I will continue to follow the patient after Dr. Shanon Payor recommendations.     Eden Lathe. Einar Gip, MD     JRG/MEDQ  D:  09/09/2010  T:  09/09/2010  Job:  [id number]  cc:   Lauraine Rinne, MD V. Leia Alf, MD  Electronically Signed by Adrian Prows MD on 09/18/2010 06:43:40 AM

## 2010-09-18 NOTE — Procedures (Signed)
NAMEFLYNN, FREZZA                 ACCOUNT NO.:  [id number]  MEDICAL RECORD NO.:  [id number]          PATIENT TYPE:  OIB  LOCATION:  2927                         FACILITY:  [facility]  PHYSICIAN:  Eden Lathe. Einar Gip, MD       DATE OF BIRTH:  Dec 20, 1945  DATE OF PROCEDURE:  09/08/2010 DATE OF DISCHARGE:                   PERIPHERAL VASCULAR INVASIVE PROCEDURE   PROCEDURE PERFORMED: 1. Abdominal aortogram. 2. Right femoral arteriogram with distal run-off. 3. Abdominal aortogram with left femoral runoff.  INDICATIONS:  [name] is a 65 year old gentleman with history of diabetes, hypertension, and hyperlipidemia, who had been complaining of severe lifestyle-limiting claudication.  Outpatient lower extremity arterial Doppler had revealed bilateral inflow disease, i.e., iliac artery disease.  He had a reduced ABI of 0.8 on the left and 0.7 on the right and he is now brought to the Catheterization Lab to evaluate hisperipheral anatomy.  Abdominal aortogram:  Abdominal aortogram revealed presence of renal arteries, one on either side.  There is mild orthostatic changes noted of the abdominal aorta with mild calcification.  Abdominal aortogram with left femoral runoff:  Abdominal aortogram with left femoral runoff revealed the bilateral common iliac arteries to be completely occluded or near occluded.  The distal femoral vessels are filled by collaterals.  Left femoral arteriogram with distal runoff:  Left femoral arteriogram with distal runoff revealed left common femoral artery did show a mild to moderate amount of calcific plaque.  There was a 40-50% stenosis noted in the mid to distal segment of the left SFA.  Below the left knee, there appeared to be three-vessel runoff.  However, the anterior tibial runoff could not be well visualized secondary to bone overlap and slow contrast fairly secondary to inflow disease.  Right femoral arteriogram with distal runoff:  Right femoral  arteriogram with distal runoff reveals the right external iliac artery and common iliac artery to be subtotally occluded.  Femoral artery is filled by collateral.  Again, femoral artery shows moderate amount of calcific plaque.  Below this, the superficial femoral artery is smooth and normal.  Below the right knee, there was three-vessel runoff.  RECOMMENDATIONS:  Based on the severity and complexity of his iliac disease which are long high-grade calcified plaques and a good distal runoff.  He will benefit from aortobifemoral bypass surgery.  I discussed the findings with Dr. Annamarie Major who will be seeing him in the outpatient setting.  A total of 141 mL of contrast was utilized for diagnostic angiography.  TECHNIQUE OF PROCEDURE:  Under sterile precautions using a 5-French right femoral arterial access, a Omni flush catheter was advanced via the abdominal aorta with moderate amount of difficulty traversing through the high-grade stenotic calcifically stenosed iliac arteries. Abdominal aortogram was performed.  The patient did experience hypotension just at the beginning of the procedure.  He did receive a total of 1 mg of atropine for bradycardia which he probably had a vagal episode and dopamine at 10 mcg/kg/minute was also started to stabilize him.  The rest of the procedure went without any complication.  Abdominal aortogram with distal left femoral runoff and also right femoral arteriogram with distal runoff  was carefully performed and the data was analyzed.  The sheath was pulled out in the Cath Lab and manual pressure was held.  No other immediate complications were noted.  The dopamine will be weaned off and he will be discharged home today if he remains stable.     Eden Lathe. Einar Gip, MD     JRG/MEDQ  D:  09/08/2010  T:  09/08/2010  Job:  [id number]  cc:   Lauraine Rinne, MD  Electronically Signed by Adrian Prows MD on 09/18/2010 06:43:50 AM

## 2010-09-19 ENCOUNTER — Encounter: Payer: Self-pay | Admitting: Surgery

## 2010-09-21 ENCOUNTER — Ambulatory Visit
Admission: RE | Admit: 2010-09-21 | Discharge: 2010-09-21 | Disposition: A | Discharge status: Home / Self Care | Payer: BC Managed Care – HMO | Source: Ambulatory Visit | Attending: Surgery | Admitting: Surgery

## 2010-09-21 ENCOUNTER — Ambulatory Visit (INDEPENDENT_AMBULATORY_CARE_PROVIDER_SITE_OTHER): Payer: Medicare Other | Admitting: Surgery

## 2010-09-21 ENCOUNTER — Ambulatory Visit: Payer: Self-pay | Admitting: Surgery

## 2010-09-21 DIAGNOSIS — I7409 Other arterial embolism and thrombosis of abdominal aorta: Secondary | ICD-10-CM

## 2010-09-21 DIAGNOSIS — I7092 Chronic total occlusion of artery of the extremities: Secondary | ICD-10-CM

## 2010-09-21 MED ORDER — IOHEXOL 300 MG/ML  SOLN: 100.0000 mL | Freq: Once | INTRAMUSCULAR | Status: AC | PRN

## 2010-09-25 NOTE — Assessment & Plan Note (Signed)
OFFICE VISIT  [name], [name] DOB:  1946-01-10                                       09/21/2010 [id number]  REASON FOR VISIT:  Aortoiliac occlusive disease.  HISTORY:  This is a very pleasant 65 year old gentleman I am seeing at the request of Dr. Einar Gip for evaluation of severe aortoiliac occlusive disease.  The patient has been having problems for many years that have gotten progressively worse.  He can now only walk approximately 75 feet before his legs simply just feel like they are going to explode, according to him.  He has not had any ulceration wounds.  He denies rest pain.  The patient has significant osteoporosis having undergone multiple back surgeries.  He suffers from diabetes, hypertension, hypercholesterolemia which are all medically managed.  The patient recently underwent aortogram with bilateral runoff by Dr. Einar Gip which showed severe aortoiliac occlusive disease extending from the distal aorta into the common femoral artery.  He was deemed to not be a candidate for percutaneous revascularization and is referred for surgical management.  REVIEW OF SYSTEMS:  GENERAL:  Negative for fevers, chills, weight gain, weight loss. VASCULAR:  Positive for pain in legs when walking. CARDIAC:  Positive for shortness of breath with exertion. GI:  Positive for reflux. NEURO:  Positive for dizziness. PULMONARY:  Negative. HEME:  Negative. GU:  Negative. ENT:  Positive for recent change in eyesight and hearing. MUSCULOSKELETAL:  Positive for back pain. PSYCHIATRIC:  Positive for depression. SKIN:  Negative.  PAST MEDICAL HISTORY:  Diabetes, hypertension, hypercholesterolemia, peripheral vascular disease, osteoporosis, hypotension secondary to vasovagal episodes.  PAST SURGICAL HISTORY:  Left wrist fusion, bilateral carpal tunnel, right rotator cuff, right hand repair, prostate.  SOCIAL HISTORY:  He is single with 2 children.  Does not  smoke.  Quit in 1990.  Does not drink.  FAMILY HISTORY:  Negative for premature cardiovascular disease.  PHYSICAL EXAM:  Vital signs:  Heart rate 121, blood pressure 118/68, respiratory rate 22.  General:  He is well-appearing, in no distress. HEENT:  Within normal limits.  Lungs:  Clear bilaterally. Cardiovascular:  Regular rate and rhythm.  No murmur.  No carotid bruits.  Pedal pulses not palpable, femoral pulses not palpable. Abdomen:  Soft, nontender.  He does have diastasis.  No hernia. Musculoskeletal:  Without major deformity.  Neurological:  No focal deficits.  Skin:  Without rash.  DIAGNOSTIC STUDIES:  I have reviewed his CT scan.  I have also reviewed his angiogram which shows aortoiliac occlusive disease.  ASSESSMENT:  Aortoiliac occlusive disease.  PLAN:  I discussed treatment options at this time which include surgical revascularization versus continued medical management.  The patient states that his systems are lifestyle limiting.  He is having trouble taking care of himself because he cannot walk for long distances to do such things as buy his groceries.  We discussed proceeding with operative repair which would be an aortobifemoral bypass graft.  We discussed the risks of surgery which include the risk of death, the risk of bleeding, the risk of cardiopulmonary complications, thromboembolic issues to the intestine or lower extremity.  All of the patient's questions were answered.  We have scheduled his operation for Thursday, February 23.  I have stopped his cilostazol 5 days prior to his operation.  He is not currently taking Plavix and cannot tolerate an aspirin.  We will  need to address antiplatelet therapy after his operation.  We may want to just put him back on Plavix if it can be tolerated.    Eldridge Abrahams, MD Electronically Signed  VWB/MEDQ  D:  09/21/2010  T:  09/22/2010  Job:  [id number]  cc:   Eden Lathe. Einar Gip, MD

## 2010-10-06 ENCOUNTER — Encounter (HOSPITAL_COMMUNITY)
Admission: RE | Admit: 2010-10-06 | Discharge: 2010-10-06 | Disposition: A | Discharge status: Home / Self Care | Payer: Medicare Other | Source: Ambulatory Visit | Attending: Surgery | Admitting: Surgery

## 2010-10-06 ENCOUNTER — Other Ambulatory Visit: Payer: Self-pay | Admitting: Surgery

## 2010-10-06 DIAGNOSIS — Z01811 Encounter for preprocedural respiratory examination: Secondary | ICD-10-CM

## 2010-10-06 DIAGNOSIS — Z01818 Encounter for other preprocedural examination: Secondary | ICD-10-CM | POA: Insufficient documentation

## 2010-10-06 DIAGNOSIS — Z01812 Encounter for preprocedural laboratory examination: Secondary | ICD-10-CM | POA: Insufficient documentation

## 2010-10-06 LAB — COMPREHENSIVE METABOLIC PANEL
ALT: 27 U/L (ref 0–53)
Albumin: 3.9 g/dL (ref 3.5–5.2)
Alkaline Phosphatase: 64 U/L (ref 39–117)
GFR calc Af Amer: >60 mL/min (ref >60)
Potassium: 5 mEq/L (ref 3.5–5.1)
Sodium: 132 mEq/L — ABNORMAL LOW (ref 135–145)
Total Protein: 6.9 g/dL (ref 6.0–8.3)

## 2010-10-06 LAB — URINALYSIS, ROUTINE W REFLEX MICROSCOPIC
Ketones, ur: NEGATIVE mg/dL
Urine Glucose, Fasting: NEGATIVE mg/dL
pH: 7 (ref 5.0–8.0)

## 2010-10-06 LAB — PROTIME-INR: INR: 1.05 (ref 0.00–1.49)

## 2010-10-06 LAB — BLOOD GAS, ARTERIAL
Acid-Base Excess: 0.4 mmol/L (ref 0.0–2.0)
TCO2: 25.7 mmol/L (ref 0–100)
pCO2 arterial: 39.4 mmHg (ref 35.0–45.0)

## 2010-10-06 LAB — TYPE AND SCREEN: Antibody Screen: NEGATIVE

## 2010-10-06 LAB — CBC
HCT: 35.9 % — ABNORMAL LOW (ref 39.0–52.0)
Hemoglobin: 12 g/dL — ABNORMAL LOW (ref 13.0–17.0)
WBC: 6 10*3/uL (ref 4.0–10.5)

## 2010-10-06 LAB — APTT: aPTT: 30 seconds (ref 24–37)

## 2010-10-08 ENCOUNTER — Inpatient Hospital Stay (HOSPITAL_COMMUNITY)
Admission: RE | Admit: 2010-10-08 | Discharge: 2010-10-15 | DRG: 237 | Disposition: A | Discharge status: Home / Self Care | Payer: Medicare Other | Source: Ambulatory Visit | Attending: Surgery | Admitting: Surgery

## 2010-10-08 ENCOUNTER — Inpatient Hospital Stay (HOSPITAL_COMMUNITY): Payer: Medicare Other

## 2010-10-08 ENCOUNTER — Other Ambulatory Visit: Payer: Self-pay | Admitting: Surgery

## 2010-10-08 DIAGNOSIS — E119 Type 2 diabetes mellitus without complications: Secondary | ICD-10-CM | POA: Diagnosis present

## 2010-10-08 DIAGNOSIS — K859 Acute pancreatitis without necrosis or infection, unspecified: Secondary | ICD-10-CM | POA: Diagnosis not present

## 2010-10-08 DIAGNOSIS — E871 Hypo-osmolality and hyponatremia: Secondary | ICD-10-CM | POA: Diagnosis present

## 2010-10-08 DIAGNOSIS — K219 Gastro-esophageal reflux disease without esophagitis: Secondary | ICD-10-CM | POA: Diagnosis present

## 2010-10-08 DIAGNOSIS — I708 Atherosclerosis of other arteries: Principal | ICD-10-CM | POA: Diagnosis present

## 2010-10-08 DIAGNOSIS — R000 Tachycardia, unspecified: Secondary | ICD-10-CM | POA: Diagnosis not present

## 2010-10-08 DIAGNOSIS — I739 Peripheral vascular disease, unspecified: Secondary | ICD-10-CM | POA: Diagnosis present

## 2010-10-08 DIAGNOSIS — H539 Unspecified visual disturbance: Secondary | ICD-10-CM

## 2010-10-08 DIAGNOSIS — H5440 Blindness, one eye, unspecified eye: Secondary | ICD-10-CM | POA: Diagnosis present

## 2010-10-08 DIAGNOSIS — D62 Acute posthemorrhagic anemia: Secondary | ICD-10-CM | POA: Diagnosis not present

## 2010-10-08 DIAGNOSIS — I10 Essential (primary) hypertension: Secondary | ICD-10-CM | POA: Diagnosis present

## 2010-10-08 DIAGNOSIS — Z87891 Personal history of nicotine dependence: Secondary | ICD-10-CM

## 2010-10-08 DIAGNOSIS — I69998 Other sequelae following unspecified cerebrovascular disease: Secondary | ICD-10-CM

## 2010-10-08 DIAGNOSIS — I70219 Atherosclerosis of native arteries of extremities with intermittent claudication, unspecified extremity: Secondary | ICD-10-CM

## 2010-10-08 LAB — BASIC METABOLIC PANEL
BUN: 13 mg/dL (ref 6–23)
Calcium: 7.5 mg/dL — ABNORMAL LOW (ref 8.4–10.5)
GFR calc non Af Amer: >60 mL/min (ref >60)
Glucose, Bld: 176 mg/dL — ABNORMAL HIGH (ref 70–99)
Potassium: 4.1 mEq/L (ref 3.5–5.1)

## 2010-10-08 LAB — PROTIME-INR
INR: 1.33 (ref 0.00–1.49)
Prothrombin Time: 16.7 seconds — ABNORMAL HIGH (ref 11.6–15.2)

## 2010-10-08 LAB — CBC
MCHC: 33.6 g/dL (ref 30.0–36.0)
RDW: 14.3 % (ref 11.5–15.5)

## 2010-10-08 LAB — GLUCOSE, CAPILLARY
Glucose-Capillary: 148 mg/dL — ABNORMAL HIGH (ref 70–99)
Glucose-Capillary: 156 mg/dL — ABNORMAL HIGH (ref 70–99)

## 2010-10-09 ENCOUNTER — Inpatient Hospital Stay (HOSPITAL_COMMUNITY): Payer: Medicare Other

## 2010-10-09 LAB — COMPREHENSIVE METABOLIC PANEL
ALT: 14 U/L (ref 0–53)
AST: 20 U/L (ref 0–37)
Albumin: 2.2 g/dL — ABNORMAL LOW (ref 3.5–5.2)
CO2: 25 mEq/L (ref 19–32)
Calcium: 7.3 mg/dL — ABNORMAL LOW (ref 8.4–10.5)
GFR calc Af Amer: >60 mL/min (ref >60)
GFR calc non Af Amer: >60 mL/min (ref >60)
Sodium: 130 mEq/L — ABNORMAL LOW (ref 135–145)
Total Protein: 4.3 g/dL — ABNORMAL LOW (ref 6.0–8.3)

## 2010-10-09 LAB — CBC
HCT: 28 % — ABNORMAL LOW (ref 39.0–52.0)
RDW: 14.4 % (ref 11.5–15.5)
WBC: 9.1 10*3/uL (ref 4.0–10.5)

## 2010-10-09 LAB — GLUCOSE, CAPILLARY
Glucose-Capillary: 130 mg/dL — ABNORMAL HIGH (ref 70–99)
Glucose-Capillary: 136 mg/dL — ABNORMAL HIGH (ref 70–99)
Glucose-Capillary: 182 mg/dL — ABNORMAL HIGH (ref 70–99)
Glucose-Capillary: 208 mg/dL — ABNORMAL HIGH (ref 70–99)

## 2010-10-09 LAB — AMYLASE: Amylase: 939 U/L — ABNORMAL HIGH (ref 0–105)

## 2010-10-10 ENCOUNTER — Inpatient Hospital Stay (HOSPITAL_COMMUNITY): Payer: Medicare Other

## 2010-10-10 LAB — CBC
MCH: 27.4 pg (ref 26.0–34.0)
MCHC: 33.2 g/dL (ref 30.0–36.0)
RDW: 14.8 % (ref 11.5–15.5)

## 2010-10-10 LAB — GLUCOSE, CAPILLARY
Glucose-Capillary: 119 mg/dL — ABNORMAL HIGH (ref 70–99)
Glucose-Capillary: 121 mg/dL — ABNORMAL HIGH (ref 70–99)
Glucose-Capillary: 129 mg/dL — ABNORMAL HIGH (ref 70–99)

## 2010-10-10 LAB — AMYLASE: Amylase: 701 U/L — ABNORMAL HIGH (ref 0–105)

## 2010-10-10 LAB — BASIC METABOLIC PANEL
BUN: 14 mg/dL (ref 6–23)
CO2: 27 mEq/L (ref 19–32)
Calcium: 7.5 mg/dL — ABNORMAL LOW (ref 8.4–10.5)
Creatinine, Ser: 0.73 mg/dL (ref 0.4–1.5)
GFR calc Af Amer: >60 mL/min (ref >60)

## 2010-10-11 LAB — GLUCOSE, CAPILLARY
Glucose-Capillary: 120 mg/dL — ABNORMAL HIGH (ref 70–99)
Glucose-Capillary: 147 mg/dL — ABNORMAL HIGH (ref 70–99)

## 2010-10-11 LAB — CBC
HCT: 26.6 % — ABNORMAL LOW (ref 39.0–52.0)
Hemoglobin: 8.6 g/dL — ABNORMAL LOW (ref 13.0–17.0)
MCHC: 32.3 g/dL (ref 30.0–36.0)
WBC: 9.6 10*3/uL (ref 4.0–10.5)

## 2010-10-11 LAB — BASIC METABOLIC PANEL
CO2: 26 mEq/L (ref 19–32)
Glucose, Bld: 125 mg/dL — ABNORMAL HIGH (ref 70–99)
Potassium: 4.1 mEq/L (ref 3.5–5.1)
Sodium: 137 mEq/L (ref 135–145)

## 2010-10-12 LAB — BASIC METABOLIC PANEL
BUN: 12 mg/dL (ref 6–23)
GFR calc non Af Amer: >60 mL/min (ref >60)
Potassium: 4 mEq/L (ref 3.5–5.1)
Sodium: 134 mEq/L — ABNORMAL LOW (ref 135–145)

## 2010-10-12 LAB — GLUCOSE, CAPILLARY
Glucose-Capillary: 137 mg/dL — ABNORMAL HIGH (ref 70–99)
Glucose-Capillary: 139 mg/dL — ABNORMAL HIGH (ref 70–99)
Glucose-Capillary: 170 mg/dL — ABNORMAL HIGH (ref 70–99)

## 2010-10-12 LAB — CBC
Platelets: 195 10*3/uL (ref 150–400)
RDW: 14.8 % (ref 11.5–15.5)
WBC: 7.2 10*3/uL (ref 4.0–10.5)

## 2010-10-13 LAB — BASIC METABOLIC PANEL
BUN: 10 mg/dL (ref 6–23)
Chloride: 103 mEq/L (ref 96–112)
Creatinine, Ser: 0.79 mg/dL (ref 0.4–1.5)
GFR calc Af Amer: >60 mL/min (ref >60)
GFR calc non Af Amer: >60 mL/min (ref >60)
Potassium: 3.4 mEq/L — ABNORMAL LOW (ref 3.5–5.1)
Potassium: 3.7 mEq/L (ref 3.5–5.1)
Sodium: 135 mEq/L (ref 135–145)

## 2010-10-13 LAB — CBC
MCH: 26.8 pg (ref 26.0–34.0)
Platelets: 235 10*3/uL (ref 150–400)
RBC: 2.87 MIL/uL — ABNORMAL LOW (ref 4.22–5.81)

## 2010-10-13 LAB — GLUCOSE, CAPILLARY
Glucose-Capillary: 144 mg/dL — ABNORMAL HIGH (ref 70–99)
Glucose-Capillary: 131 mg/dL — ABNORMAL HIGH (ref 70–99)

## 2010-10-13 LAB — PREPARE RBC (CROSSMATCH)

## 2010-10-14 LAB — BASIC METABOLIC PANEL
BUN: 8 mg/dL (ref 6–23)
CO2: 26 mEq/L (ref 19–32)
Calcium: 8.3 mg/dL — ABNORMAL LOW (ref 8.4–10.5)
Creatinine, Ser: 0.7 mg/dL (ref 0.4–1.5)
GFR calc Af Amer: >60 mL/min (ref >60)
Glucose, Bld: 104 mg/dL — ABNORMAL HIGH (ref 70–99)

## 2010-10-14 LAB — CBC
HCT: 28.2 % — ABNORMAL LOW (ref 39.0–52.0)
Hemoglobin: 9.4 g/dL — ABNORMAL LOW (ref 13.0–17.0)
MCH: 27.2 pg (ref 26.0–34.0)
MCHC: 33.3 g/dL (ref 30.0–36.0)

## 2010-10-14 LAB — CROSSMATCH
ABO/RH(D): A NEG
Unit division: 0
Unit division: 0

## 2010-10-14 LAB — GLUCOSE, CAPILLARY
Glucose-Capillary: 140 mg/dL — ABNORMAL HIGH (ref 70–99)
Glucose-Capillary: 172 mg/dL — ABNORMAL HIGH (ref 70–99)
Glucose-Capillary: 117 mg/dL — ABNORMAL HIGH (ref 70–99)

## 2010-10-15 LAB — BASIC METABOLIC PANEL
CO2: 25 mEq/L (ref 19–32)
Glucose, Bld: 143 mg/dL — ABNORMAL HIGH (ref 70–99)
Potassium: 3.3 mEq/L — ABNORMAL LOW (ref 3.5–5.1)
Sodium: 133 mEq/L — ABNORMAL LOW (ref 135–145)

## 2010-10-15 LAB — GLUCOSE, CAPILLARY: Glucose-Capillary: 183 mg/dL — ABNORMAL HIGH (ref 70–99)

## 2010-10-18 NOTE — Discharge Summary (Addendum)
[name], OTTUM                 ACCOUNT NO.:  [id number]  MEDICAL RECORD NO.:  [id number]           PATIENT TYPE:  O  LOCATION:  XRAY                         FACILITY:  [facility]  PHYSICIAN:  Theotis Burrow IV, MDDATE OF BIRTH:  1945/09/11  DATE OF ADMISSION:  10/06/2010 DATE OF DISCHARGE:  10/06/2010                              DISCHARGE SUMMARY   ADMIT DIAGNOSIS:  Aortoiliac occlusive disease.  PAST MEDICAL HISTORY AND DISCHARGE DIAGNOSES: 1. Aortoiliac occlusive disease, status post aortobifemoral bypass     graft and reimplantation of inferior mesenteric artery. 2. Diabetes. 3. Hypertension. 4. Hypercholesterolemia. 5. Peripheral vascular disease. 6. Osteoporosis. 7. Hypotension secondary to vasovagal episodes. 8. Laparoscopic fusion. 9. Bilateral carpal tunnel. 10.Right rotator cuff repair. 11.Right heel repair. 12.Prostate surgery. 13.Multiple back surgeries. 14.Postoperative anemia, status post multiple transfusions, currently     stable. 15.Questionable postoperative pancreatitis with an elevated amylase in     the early course, which resolved quickly.  ALLERGIES: 1. ADHESIVE TAPE, which causes skin erythema and pruritus. 2. SULFA, hives. 3. DARVOCET, itching.  BRIEF HISTORY:  The patient is a 65 year old Caucasian male who was initially evaluated by Dr. Einar Gip with an aortogram for bilateral lower extremity claudication.  The patient was found to have extensive iliac disease not amenable to percutaneous revascularization and was, therefore, referred to Dr. Trula Slade for surgical intervention.  After evaluation, Dr. Trula Slade felt that the patient should proceed with an aortobifemoral bypass graft for life-limiting claudication.  HOSPITAL COURSE:  The patient was admitted and taken to the OR on October 08, 2010, for an aortobifemoral bypass graft with reimplantation of the inferior mesenteric artery.  The patient tolerated the procedure well and was stable  immediately postoperatively.  He did have some hypotension which was resolved with IV medications and IV fluids.  He was extubated without complication and woke up from anesthesia neurologically intact.  In the early postoperative course, the patient was noted to have an elevated amylase up to 939.  This subsequently began to trend down and came back to normal level without difficulty.  The patient did also have an acute blood loss anemia postoperatively and was transfused on 2 separate occasions both of which he tolerated well.  His hemoglobin and hematocrit responded appropriately and are currently stable.  The patient's NG tube was discontinued successfully on postoperative day 3 and his diet was slowly resumed as his bowel function returned. He is currently tolerating regular diet and having bowel movements.  He is ambulating without difficulty and was able to void without difficulty after his Foley catheter was removed.  On October 15, 2010, the patient is without commplaint.  He is ambulating without difficulty and has had bowel movement.  He is tolerating regular diet.  PHYSICAL EXAMINATION:  VITAL SIGNS:  He is afebrile with stable vital signs. CARDIAC:  Regular rate and rhythm. LUNGS:  Clear to auscultation. ABDOMEN:  Soft with active bowel sounds.  The incisions were clean, dry, and intact. EXTREMITIES:  There is no edema in the extremities.  There is 2+ posterior tibial pulses present bilaterally.  The patient is doing well and  felt stable for discharge home at this time.  The patient has had hypokalemia and this was replaced successfully.  LABORATORY DATA:  CBC on October 14, 2010, white count 7.7, hemoglobin 9.4, hematocrit 28.2, platelets 249.  BMP on October 15, 2010, sodium 133, potassium 3.3, BUN 6, creatinine 0.73.  DISCHARGE INSTRUCTIONS:  The patient received specific written discharge instructions regarding diet, activity, and wound care.  He is to refrain from  lifting anything over 10 pounds for 6 full weeks after surgery.  He cannot drive or take narcotic medications or until his followup with Dr. Trula Slade.  The patient will have an appointment scheduled with Dr. Trula Slade by our office approximately 2 weeks after discharge.  The patient will be contacted with the date and time of that appointment.  DISCHARGE MEDICATIONS: 1. Oxycodone/acetaminophen 10/325 mg 1-2 p.o. q.4-6 hours p.r.n. pain. 2. Bupropion 300 mg daily. 3. Carvedilol 6.25 mg b.i.d. 4. Celebrex 200 mg 2 capsules daily. 5. Cilostazol 100 mg b.i.d. 6. Clonazepam 1 mg t.i.d. 7. Crestor 10 mg daily. 8. Enalapril 10 mg daily. 9. Flomax 0.4 mg b.i.d. 10.Forteo 20 mcg subcu daily. 11.Glimepiride 4 mg b.i.d. 12.Januvia 100 mg daily. 13.Methylfolate/methylcobalamin/Pyridoxal 10/15/33 mg p.o. b.i.d. 14.Methocarbamol 500 mg p.o. q.6 p.r.n. 15.Multivitamin daily. 16.Omeprazole 40 mg b.i.d. 17.Sertraline 100 mg p.o. daily. 18.Dimetapp OTC 5-6 tablets t.i.d. 19.Vitamin D3 OTC daily. 20.Zolpidem 10 mg daily. 21.Zonisamide 100 mg 1-1/2 tablets at bedtime.     Leta Baptist, PA   ______________________________ V. Leia Alf, MD    AY/MEDQ  D:  10/15/2010  T:  10/15/2010  Job:  [id number]  Electronically Signed by Leta Baptist PA on 10/16/2010 02:15:03 PM Electronically Signed by Orvan Falconer IV MD on 10/18/2010 07:40:12 PM

## 2010-10-18 NOTE — Op Note (Addendum)
[name], [name]                 ACCOUNT NO.:  [id number]  MEDICAL RECORD NO.:  [id number]           PATIENT TYPE:  I  LOCATION:  [id number]                         FACILITY:  [facility]  PHYSICIAN:  [name], MDDATE OF BIRTH:  11-26-1945  DATE OF PROCEDURE:  10/08/2010 DATE OF DISCHARGE:                              OPERATIVE REPORT   PREOPERATIVE DIAGNOSIS:  Bilateral claudication.  POSTOPERATIVE DIAGNOSIS:  Bilateral claudication.  PROCEDURES PERFORMED: 1. Aortobifemoral bypass graft with 16 x 8 bifurcated Dacron graft. 2. Reimplantation of the inferior mesenteric artery.  SURGEON: 1. Anthony Alf, MD  ASSISTANT:  [name]. Anthony Simmering, MD  ANESTHESIA:  General.  BLOOD LOSS:  See anesthesia record.  COMPLICATIONS:  None.  FINDINGS:  An end-to-end proximal anastomosis was performed.  Bilateral profundoplasties were performed and the inferior mesenteric artery was reimplanted to the tube portion of the graft.  INDICATIONS:  [name] is a 65 year old gentleman seen initially by Dr. Einar [name].  He underwent aortogram for bilateral claudication.  He was found to have extensive iliac disease, not amendable to percutaneous revascularization.  He was referred for surgical intervention.  I discussed proceeding with aortobifemoral bypass graft for lifestyle- limiting claudication.  The risks and benefits were discussed as detailed in the previous clinic note.  The patient comes in today for his repair.  PROCEDURE IN DETAIL:  The patient was identified in the holding area and taken to room #9, placed supine on the table.  General endotracheal anesthesia was administered.  The patient was prepped and draped in usual fashion.  A time-out was called.  Antibiotics were given.  I made longitudinal incisions in both groins.  Cautery was used to divide the subcutaneous tissue.  The femoral sheath was opened bilaterally.  The common femoral arteries were circumferentially dissected  free from the inguinal ligament down to the bifurcation.  The profunda femoral and superficial femoral arteries were each individually isolated with vessel loops.  Crossing iliac veins were divided under the inguinal ligament between 2-0 silk ties.  All side branches were also isolated between Potts 2-0 silk ties.  Once the groins were adequately isolated, they were packed with a moist Ray-Tec, and attention was turned towards the abdomen.  A midline incision was made from the xiphoid to below the umbilicus.  Cautery was used to divide the subcutaneous tissue.  The fascia was then identified and opened with cautery.  The peritoneal cavity was entered and then opened throughout the length of the incision.  The abdomen was then grossly inspected.  There was no gross pathology.  An Omni-Tract retractor was placed as well as a Probation officer. The transverse colon was reflected cephalad, and the small bowel was mobilized to the patient's right and the ligament of Treitz was taken down with a combination of cautery and sharp dissection.  The inferior mesenteric vein was divided between 2-0 silk ties.  The patient had redundant mesenteric fat, which was somewhat meddlesome but was able to be retracted out of the way with the Omni-Tract retractors.  I circumferentially dissected out the aorta below the renal arteries and an umbilical  tape was passed.  Dissection was then carried down to the inferior mesenteric artery, which was circumferentially dissected out and encircled with a red vessel loop.  The aortic bifurcation was identified.  I then created a tunnel between the groin and the abdomen tunneling anterior to the iliac arteries bilaterally and posterior to the ureters, and umbilical tape was passed.  At this point, the patient was heparinized.  After the heparin was circulated, the aorta was occluded with an aortic DeBakey clamp both proximally and distally.  The aorta was then opened.  There was  lumbar backbleeding, which required isolation from outside of the aorta.  The lumbar arteries were ligated with clips.  I elected to perform an end-to-end anastomosis.  A 16 x 8 bifurcated Dacron graft was brought onto the field and end-to-end aortic anastomosis was performed using a felt strip.  Three horizontal mattress sutures were placed on the posterior wall and then the lateral two sutures were run anteriorly incorporating the felt strip.  Once the aortic anastomosis was performed, clamps were placed on the limb leads. I also oversewed the distal end of the aorta with running 3-0 Prolene in two layers.  This was done proximal to the inferior mesenteric artery. The clamps were then released.  The aortic anastomosis was hemostatic. I then brought the two limbs of the graft through the previously created tunnels into both groins.  Dr. Kellie [name] performed the left groin anastomosis and I performed the right.  In order to do this, the common femoral and superficial femoral and profunda femoral artery as well as side branches were occluded with vascular clamps.  On each side, an arteriotomy was made, which extended from the distal common femoral artery out onto the profunda femoral artery for a distance of approximately 2-3 mm and an end-to-side anastomosis was performed in each groin after cutting the graft to the appropriate length and beveling it to fit the size of the arteriotomy.  Profundoplasties were performed bilaterally.  Just prior to completing both anastomoses, the appropriate flush maneuvers were performed and the anastomoses were then completed.  Blood flow at each leg was restored sequentially.  There were excellent Doppler signals in the profunda femoral and superficial femoral arteries bilaterally.  At this point, since I had performed an end-to-end anastomosis, the patient had a large inferior mesenteric artery, I elected to prophylactically reimplant the inferior  mesenteric artery.  There was, however, good pulsatile backbleeding from the inferior mesenteric artery but due to the size, I selected to go ahead and reimplant it.  Due to its location, this needed to be done into the tube portion of the bifurcated graft.  This was occluded with a Cooley J clamp.  An 11 blade was used to open the graft and a 5 coronary punch was used to make the graftotomy.  I performed an end-to-side anastomosis between the inferior mesenteric artery and the straight portion of the graft with a running 6- 0 Prolene prior to completion.  The inferior mesenteric artery and the graft were flushed.  The anastomosis was completed.  Doppler was used to evaluate the signal made for mesenteric artery, had a multiphasic signal.  At this point, I was satisfied with the repair.  I reversed the patient's heparin with protamine.  Hemostasis was then achieved.  The retroperitoneum was then closed with running 2-0 Vicryl.  The small bowel was placed back into its anatomic position.  I ran the small bowel and it was without defect.  The transverse  colon was reflected down as was the omentum.  The fascia was then closed with two running #1 PDS sutures.  The subcutaneous tissue was closed with 3-0 Vicryl and the skin was closed with 4-0 Vicryl.  Dermabond was placed.  Attention was then turned towards the groins.  The groins were irrigated.  The femoral sheath was then reapproximated 2-0 Vicryl.  The subcutaneous tissue was closed in two more layers of 3-0 Vicryl.  The skin was closed with 4-0 Vicryl.  Dermabond was placed on the wounds. The patient had palpable pedal pulses at the end of the case.  The patient tolerated the procedure well.  There were no complications.     Anthony Abrahams, MD     VWB/MEDQ  D:  10/10/2010  T:  10/11/2010  Job:  [id number]  Electronically Signed by Orvan Falconer IV MD on 10/18/2010 07:40:15 PM

## 2010-10-20 ENCOUNTER — Encounter (INDEPENDENT_AMBULATORY_CARE_PROVIDER_SITE_OTHER): Payer: Medicare Other

## 2010-10-20 DIAGNOSIS — M79609 Pain in unspecified limb: Secondary | ICD-10-CM

## 2010-10-20 DIAGNOSIS — Z48812 Encounter for surgical aftercare following surgery on the circulatory system: Secondary | ICD-10-CM

## 2010-10-23 ENCOUNTER — Emergency Department (HOSPITAL_COMMUNITY)
Admission: EM | Admit: 2010-10-23 | Discharge: 2010-10-23 | Disposition: A | Discharge status: Home / Self Care | Payer: Medicare Other | Attending: Emergency Medicine | Admitting: Emergency Medicine

## 2010-10-23 DIAGNOSIS — Z09 Encounter for follow-up examination after completed treatment for conditions other than malignant neoplasm: Secondary | ICD-10-CM | POA: Insufficient documentation

## 2010-10-23 DIAGNOSIS — I2510 Atherosclerotic heart disease of native coronary artery without angina pectoris: Secondary | ICD-10-CM | POA: Insufficient documentation

## 2010-10-23 DIAGNOSIS — I739 Peripheral vascular disease, unspecified: Secondary | ICD-10-CM | POA: Insufficient documentation

## 2010-10-23 DIAGNOSIS — M810 Age-related osteoporosis without current pathological fracture: Secondary | ICD-10-CM | POA: Insufficient documentation

## 2010-10-23 DIAGNOSIS — Y838 Other surgical procedures as the cause of abnormal reaction of the patient, or of later complication, without mention of misadventure at the time of the procedure: Secondary | ICD-10-CM | POA: Insufficient documentation

## 2010-10-23 DIAGNOSIS — I10 Essential (primary) hypertension: Secondary | ICD-10-CM | POA: Insufficient documentation

## 2010-10-23 DIAGNOSIS — E119 Type 2 diabetes mellitus without complications: Secondary | ICD-10-CM | POA: Insufficient documentation

## 2010-10-23 DIAGNOSIS — Z8673 Personal history of transient ischemic attack (TIA), and cerebral infarction without residual deficits: Secondary | ICD-10-CM | POA: Insufficient documentation

## 2010-10-23 DIAGNOSIS — T8140XA Infection following a procedure, unspecified, initial encounter: Secondary | ICD-10-CM | POA: Insufficient documentation

## 2010-10-26 ENCOUNTER — Ambulatory Visit (INDEPENDENT_AMBULATORY_CARE_PROVIDER_SITE_OTHER): Payer: Medicare Other | Admitting: Surgery

## 2010-10-26 ENCOUNTER — Ambulatory Visit: Payer: Medicare Other

## 2010-10-26 DIAGNOSIS — I70219 Atherosclerosis of native arteries of extremities with intermittent claudication, unspecified extremity: Secondary | ICD-10-CM

## 2010-10-27 NOTE — Assessment & Plan Note (Addendum)
OFFICE VISIT  Anthony, [name] DOB:  11-14-1945                                       10/26/2010 [id number]  The patient comes back in today for followup.  He is status post aortobifemoral bypass graft on 10/08/2010.  Last Thursday he developed a bulge in his groin.  It was ultrasounded and there was no evidence of a pseudoaneurysm.  He went to the emergency department and they started him on antibiotics, however, there was no clear-cut infection.  He comes in today without any new complaints.  He still has some occasional pain from his abdominal incision.  There is a fluid filled collection in the right groin.  There was no evidence of infection.  The wound is completely closed.  His midline incision is healed without evidence of hernia.  I discussed with the patient I feel he has developed a seroma in both groins, the right greater than the left.  We discussed options of exploring the wound and reclosing it versus aspiration versus continued observation.  We were both under the agreement that continuing observation is the way to go at this time.  I have given him 30 more oxycodone (10/325).  I plan on seeing him back in a month.    Eldridge Abrahams, MD Electronically Signed  VWB/MEDQ  D:  10/26/2010  T:  10/27/2010  Job:  [id number]

## 2010-10-28 NOTE — Procedures (Unsigned)
VASCULAR LAB EXAM  INDICATION:  Rule out right groin pseudoaneurysm.  HISTORY: Diabetes:  Orally dependent. Cardiac:  No. Hypertension:  Yes. Other:  Status post aortobifem bypass, 10/08/2010.  EXAM:  Right groin scan to rule out pseudoaneurysm.  IMPRESSION: 1. No evidence of right groin pseudoaneurysm. 2. Two hematomas noted without the presence of vascular flow. 3. The first hematoma measuring 2.54 X 2.94 cm. 4. Second hematoma measuring 4.44 X 4.13 cm.  ___________________________________________ V. Leia Alf, MD  EM/MEDQ  D:  10/20/2010  T:  10/20/2010  Job:  [id number]

## 2010-10-29 LAB — COMPREHENSIVE METABOLIC PANEL
ALT: 29 U/L (ref 0–53)
Albumin: 3.4 g/dL — ABNORMAL LOW (ref 3.5–5.2)
Alkaline Phosphatase: 85 U/L (ref 39–117)
BUN: 8 mg/dL (ref 6–23)
Chloride: 93 mEq/L — ABNORMAL LOW (ref 96–112)
Glucose, Bld: 136 mg/dL — ABNORMAL HIGH (ref 70–99)
Potassium: 4.5 mEq/L (ref 3.5–5.1)
Sodium: 127 mEq/L — ABNORMAL LOW (ref 135–145)
Total Bilirubin: 0.3 mg/dL (ref 0.3–1.2)

## 2010-10-29 LAB — POCT I-STAT, CHEM 8
Calcium, Ion: 1.19 mmol/L (ref 1.12–1.32)
Chloride: 92 mEq/L — ABNORMAL LOW (ref 96–112)
Glucose, Bld: 133 mg/dL — ABNORMAL HIGH (ref 70–99)
HCT: 37 % — ABNORMAL LOW (ref 39.0–52.0)
Hemoglobin: 12.6 g/dL — ABNORMAL LOW (ref 13.0–17.0)
TCO2: 25 mmol/L (ref 0–100)

## 2010-10-29 LAB — CBC
HCT: 32.4 % — ABNORMAL LOW (ref 39.0–52.0)
MCV: 83.5 fL (ref 78.0–100.0)
Platelets: 229 10*3/uL (ref 150–400)
RBC: 3.88 MIL/uL — ABNORMAL LOW (ref 4.22–5.81)
WBC: 6.2 10*3/uL (ref 4.0–10.5)

## 2010-10-29 LAB — DIFFERENTIAL
Basophils Absolute: 0 10*3/uL (ref 0.0–0.1)
Basophils Relative: 0 % (ref 0–1)
Eosinophils Absolute: 0.3 10*3/uL (ref 0.0–0.7)
Eosinophils Relative: 5 % (ref 0–5)
Monocytes Absolute: 1.6 10*3/uL — ABNORMAL HIGH (ref 0.1–1.0)
Monocytes Relative: 25 % — ABNORMAL HIGH (ref 3–12)
Neutro Abs: 3.5 10*3/uL (ref 1.7–7.7)

## 2010-10-29 LAB — POCT CARDIAC MARKERS

## 2010-11-02 ENCOUNTER — Ambulatory Visit: Payer: Medicare Other | Admitting: Surgery

## 2010-11-07 ENCOUNTER — Inpatient Hospital Stay (INDEPENDENT_AMBULATORY_CARE_PROVIDER_SITE_OTHER)
Admission: RE | Admit: 2010-11-07 | Discharge: 2010-11-07 | Disposition: A | Discharge status: Home / Self Care | Payer: Medicare Other | Source: Ambulatory Visit | Attending: Emergency Medicine | Admitting: Emergency Medicine

## 2010-11-07 DIAGNOSIS — L259 Unspecified contact dermatitis, unspecified cause: Secondary | ICD-10-CM

## 2010-11-16 ENCOUNTER — Ambulatory Visit (INDEPENDENT_AMBULATORY_CARE_PROVIDER_SITE_OTHER): Payer: Medicare Other | Admitting: Surgery

## 2010-11-16 DIAGNOSIS — I70219 Atherosclerosis of native arteries of extremities with intermittent claudication, unspecified extremity: Secondary | ICD-10-CM

## 2010-11-17 NOTE — Assessment & Plan Note (Signed)
OFFICE VISIT  [name], [name] DOB:  1946-02-15                                       11/16/2010 [id number]  The patient comes back in today for an unscheduled visit.  He is status post aortobifemoral bypass graft on 10/08/2010.  He states that he developed a reaction to a new pair of Dockers pants that he wore.  He had a band of redness that had spanned all the way around his waist.  It has gotten better.  However, there was a residual portion around his midline incision.  Upon my inspection he clearly has a superficial infection centered around his incision.  I was able to express a small amount of pus.  I therefore opened up an approximately 2 cm area of his incision and evacuated a small amount of purulence.  The tissue looked healthy. There was good bleeding.  I packed this area with gauze.  I placed him on antibiotics and I am going to have him come back to see me in a week.    Eldridge Abrahams, MD Electronically Signed  VWB/MEDQ  D:  11/16/2010  T:  11/17/2010  Job:  [id number]  cc:   Eden Lathe. Einar Gip, MD

## 2010-11-23 ENCOUNTER — Ambulatory Visit (INDEPENDENT_AMBULATORY_CARE_PROVIDER_SITE_OTHER): Payer: Medicare Other | Admitting: Surgery

## 2010-11-23 DIAGNOSIS — I70219 Atherosclerosis of native arteries of extremities with intermittent claudication, unspecified extremity: Secondary | ICD-10-CM

## 2010-11-24 NOTE — Assessment & Plan Note (Signed)
OFFICE VISIT  [name], [name] DOB:  1945/12/03                                       11/23/2010 [id number]  The patient comes back today for followup.  He is status post aortobifemoral bypass on 10/08/2010.  I have been following a wound issue in his midline incision around just below to his umbilicus.  I opened it up last week.  He comes back today for followup.  I had him on antibiotics.  His wound has gotten dramatically better.  It is very, very superficial. I cauterized it with silver nitrate today.  I do not see any evidence of infection.  I am going to have him come back to see me in 2 weeks for a wound check.  It just needs to have a dry gauze placed on it.    Eldridge Abrahams, MD Electronically Signed  VWB/MEDQ  D:  11/23/2010  T:  11/24/2010  Job:  [id number]

## 2010-11-30 ENCOUNTER — Ambulatory Visit: Payer: Medicare Other | Admitting: Surgery

## 2010-12-07 ENCOUNTER — Ambulatory Visit (INDEPENDENT_AMBULATORY_CARE_PROVIDER_SITE_OTHER): Payer: Medicare Other | Admitting: Surgery

## 2010-12-07 DIAGNOSIS — T8140XA Infection following a procedure, unspecified, initial encounter: Secondary | ICD-10-CM

## 2010-12-07 DIAGNOSIS — I7092 Chronic total occlusion of artery of the extremities: Secondary | ICD-10-CM

## 2010-12-08 NOTE — Assessment & Plan Note (Signed)
OFFICE VISIT  [name], [name] DOB:  [birth year]                                       12/07/2010 [id number]  The patient comes back today for a wound check.  His wound has completely healed.  He is concerned about an aspect at the superior aspect of his wound which is consistent with a knot from his suture.  I think this will resolve over time.  Overall I think he is doing very well.  I am going to see him back in 3 months.    Eldridge Abrahams, MD Electronically Signed  VWB/MEDQ  D:  12/07/2010  T:  12/08/2010  Job:  [id number]

## 2010-12-29 NOTE — Procedures (Signed)
CAROTID DUPLEX EXAM   INDICATION:  Left carotid bruit noted on physical examination.   HISTORY:  Diabetes:  Yes, oral dependent.  Cardiac:  Hypertension:  Yes.  Smoking:  Previous.  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  Amaurosis Fugax Yes No, Paresthesias Yes No, Hemiparesis Yes No                                       RIGHT             LEFT  Brachial systolic pressure:  Brachial Doppler waveforms:  Vertebral direction of flow:        Antegrade         Antegrade  DUPLEX VELOCITIES (cm/sec)  CCA peak systolic                   90                57  ECA peak systolic                   106               [id number]  ICA peak systolic                   93                139 m  ICA end diastolic                   28                19 m  PLAQUE MORPHOLOGY:                  Mixed             Calcified with  shadowing  PLAQUE AMOUNT:                      Minimal           Large              PLAQUE LOCATION:  Bifurcation Bifurcation/ICA/ECA     A very large calcific plaque is visible in the left bifurcation,  extending into the proximal left internal carotid artery and external  carotid artery.  Unable to obtain velocity measurements in the proximal  left internal carotid artery due to calcific shadowing obscuring the  origin of the vessel.   IMPRESSION:  1. No hemodynamically significant right internal carotid artery      stenosis, <40%.  2. At least 20%-39% mid left internal carotid artery stenosis, but      large calcific plaque with shadowing may obscure more severe      stenosis in the proximal internal carotid artery.   ___________________________________________  V. Leia Alf, MD   RS/MEDQ  D:  06/05/2010  T:  06/05/2010  Job:  [id number]

## 2011-02-19 ENCOUNTER — Encounter: Payer: Self-pay | Admitting: Cardiology

## 2011-03-08 ENCOUNTER — Other Ambulatory Visit (INDEPENDENT_AMBULATORY_CARE_PROVIDER_SITE_OTHER): Payer: Medicare Other

## 2011-03-08 ENCOUNTER — Ambulatory Visit (INDEPENDENT_AMBULATORY_CARE_PROVIDER_SITE_OTHER): Payer: Medicare Other | Admitting: Surgery

## 2011-03-08 ENCOUNTER — Other Ambulatory Visit: Payer: Self-pay | Admitting: Surgery

## 2011-03-08 DIAGNOSIS — H3400 Transient retinal artery occlusion, unspecified eye: Secondary | ICD-10-CM

## 2011-03-08 DIAGNOSIS — I739 Peripheral vascular disease, unspecified: Secondary | ICD-10-CM

## 2011-03-08 DIAGNOSIS — I7092 Chronic total occlusion of artery of the extremities: Secondary | ICD-10-CM

## 2011-03-09 ENCOUNTER — Other Ambulatory Visit (HOSPITAL_COMMUNITY): Payer: Medicare Other

## 2011-03-09 ENCOUNTER — Ambulatory Visit (HOSPITAL_COMMUNITY)
Admission: RE | Admit: 2011-03-09 | Discharge: 2011-03-09 | Disposition: A | Discharge status: Home / Self Care | Payer: Medicare Other | Source: Ambulatory Visit | Attending: Surgery | Admitting: Surgery

## 2011-03-09 DIAGNOSIS — M47812 Spondylosis without myelopathy or radiculopathy, cervical region: Secondary | ICD-10-CM | POA: Insufficient documentation

## 2011-03-09 DIAGNOSIS — R51 Headache: Secondary | ICD-10-CM | POA: Insufficient documentation

## 2011-03-09 DIAGNOSIS — I6529 Occlusion and stenosis of unspecified carotid artery: Secondary | ICD-10-CM | POA: Insufficient documentation

## 2011-03-09 DIAGNOSIS — I739 Peripheral vascular disease, unspecified: Secondary | ICD-10-CM

## 2011-03-09 DIAGNOSIS — H5460 Unqualified visual loss, one eye, unspecified: Secondary | ICD-10-CM | POA: Insufficient documentation

## 2011-03-09 MED ORDER — IOHEXOL 350 MG/ML SOLN
125.0000 mL | Freq: Once | INTRAVENOUS | Status: AC | PRN
  Administered 2011-03-09: 125 mL via INTRAVENOUS

## 2011-03-09 NOTE — Assessment & Plan Note (Signed)
OFFICE VISIT  SULEIMAN, VACANTI DOB:  Sep 05, 1945                                       03/08/2011 [id number]  The patient comes back in today for followup.  He is status post aortobifemoral bypass graft on 10/08/2010.  This was complicated by a wound incisional issue that did not resolve with dry gauze packing.  The patient has a history of a stroke 2 years ago which consisted of visual field defects.  He has had several episodes which are similar to amaurosis in description.  He was seen at [facility] and they felt this was atherosclerotic in origin.  Preoperatively the patient did have a carotid duplex which showed minimal stenosis bilaterally.  However, there was a calcific plaque obscuring the bifurcation of the carotid artery.  The patient has had several of these episodes which are almost temporary blindness over the past several weeks.  His legs are not causing him any problems.  He has no wound issues on his abdomen.  PHYSICAL EXAMINATION:  Vital signs:  Heart rate is 59, blood pressure 137/79 on the right, 164/73 on the left.  O2 sats 98%.  General:  He is well-appearing, in no distress.  HEENT:  Within normal limits. Cardiovascular:  Regular rate and rhythm.  Midline incision is well- healed.  No hernia.  Groin incisions are well-healed.  There is still healing ridge but no wound problems.  He has palpable pedal pulses bilaterally.  DIAGNOSTIC STUDIES:  Carotid duplex was performed today which shows 1%- 39% right carotid stenosis, no significant left carotid stenosis. However, there is acoustic shadowing from calcification in the left carotid bifurcation.  There is also significant calcified plaque in the innominate artery.  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  ASSESSMENT AND PLAN: 1. Claudication:  The patient is doing very well from his     aortobifemoral bypass graft.  He has palpable pulses.  His incision     is well-healed.  He  is very pleased with his ability to ambulate. 2. Carotid disease:  Although the carotid ultrasound does not show     significant stenosis I am concerned that because of the significant     calcification in the carotid bulb we may not be detecting a     significant stenosis and if this were ulcerated this could be the     reason for his amaurosis like symptoms.  I think the best way to     evaluate this initially is a CT angiogram.  He does have a contrast     allergy so he will be premedicated.  I tried to get this done     today, however, he did not wish to have it done so we will do it     tomorrow.  I will contact him with the results.  If the area is of     concern we may need to consider carotid endarterectomy.    Eldridge Abrahams, MD Electronically Signed  VWB/MEDQ  D:  03/08/2011  T:  03/09/2011  Job:  [id number]  cc:   Laverda Page, MD

## 2011-03-17 HISTORY — PX: OTHER SURGICAL HISTORY: SHX169

## 2011-03-24 NOTE — Procedures (Unsigned)
CAROTID DUPLEX EXAM  INDICATION:  Amaurosis fugax.  HISTORY: Diabetes:  Yes, orally dependent. Cardiac:  No. Hypertension:  Yes. Smoking:  Previous. Previous Surgery:  No. CV History:  Left eye vision loss recorded today, also when scanned on 05/25/2010, as well as with CVA in 2009. Amaurosis Fugax Yes No, Paresthesias Yes No, Hemiparesis Yes No                                      RIGHT             LEFT Brachial systolic pressure:         137               164 Brachial Doppler waveforms: Vertebral direction of flow:        Tardus parvus     Antegrade DUPLEX VELOCITIES (cm/sec) CCA peak systolic                   83                38 ECA peak systolic                   96                [id number] ICA peak systolic                   79                46 ICA end diastolic                   28                10 PLAQUE MORPHOLOGY:                  Mixed             Calcific PLAQUE AMOUNT:                      Minimal           Moderate/severe PLAQUE LOCATION:                    Bifurcation       Bifurcation, ICA, ECA  IMPRESSION: 1. Right internal carotid artery velocity suggests 1%-39% stenosis. 2. Left external carotid artery stenosis. 3. Significantly lower left internal carotid artery velocities in     comparison to the previous study, however, this may be due to large     calcific plaque with acoustic shadowing. 4. Innominate artery shown with significant calcified plaque. 5. Right vertebral artery shown with abnormal waveform. 6. Of note, brachial pressures right < left.  ___________________________________________ V. Leia Alf, MD  EM/MEDQ  D:  03/09/2011  T:  03/09/2011  Job:  [id number]

## 2011-03-30 ENCOUNTER — Encounter (HOSPITAL_COMMUNITY)
Admission: RE | Admit: 2011-03-30 | Discharge: 2011-03-30 | Disposition: A | Discharge status: Home / Self Care | Payer: Medicare Other | Source: Ambulatory Visit | Attending: Surgery | Admitting: Surgery

## 2011-03-30 ENCOUNTER — Other Ambulatory Visit: Payer: Self-pay | Admitting: Surgery

## 2011-03-30 DIAGNOSIS — I6529 Occlusion and stenosis of unspecified carotid artery: Secondary | ICD-10-CM

## 2011-03-30 LAB — COMPREHENSIVE METABOLIC PANEL
AST: 26 U/L (ref 0–37)
Albumin: 3.7 g/dL (ref 3.5–5.2)
CO2: 25 mEq/L (ref 19–32)
Calcium: 9.1 mg/dL (ref 8.4–10.5)
Creatinine, Ser: 0.8 mg/dL (ref 0.50–1.35)
GFR calc non Af Amer: >60 mL/min (ref >60)

## 2011-03-30 LAB — URINALYSIS, ROUTINE W REFLEX MICROSCOPIC
Hgb urine dipstick: NEGATIVE
Specific Gravity, Urine: 1.011 (ref 1.005–1.030)
Urobilinogen, UA: 0.2 mg/dL (ref 0.0–1.0)

## 2011-03-30 LAB — PROTIME-INR
INR: 1.15 (ref 0.00–1.49)
Prothrombin Time: 14.9 seconds (ref 11.6–15.2)

## 2011-03-30 LAB — TYPE AND SCREEN
ABO/RH(D): A NEG
Antibody Screen: NEGATIVE

## 2011-03-30 LAB — APTT: aPTT: 33 seconds (ref 24–37)

## 2011-03-30 LAB — CBC
Platelets: 176 10*3/uL (ref 150–400)
RDW: 15.9 % — ABNORMAL HIGH (ref 11.5–15.5)
WBC: 4.1 10*3/uL (ref 4.0–10.5)

## 2011-03-30 LAB — SURGICAL PCR SCREEN: Staphylococcus aureus: NEGATIVE

## 2011-04-01 ENCOUNTER — Other Ambulatory Visit: Payer: Self-pay | Admitting: Surgery

## 2011-04-01 ENCOUNTER — Inpatient Hospital Stay (HOSPITAL_COMMUNITY)
Admission: RE | Admit: 2011-04-01 | Discharge: 2011-04-02 | DRG: 039 | Disposition: A | Discharge status: Home / Self Care | Payer: Medicare Other | Source: Ambulatory Visit | Attending: Surgery | Admitting: Surgery

## 2011-04-01 DIAGNOSIS — Z01812 Encounter for preprocedural laboratory examination: Secondary | ICD-10-CM

## 2011-04-01 DIAGNOSIS — I10 Essential (primary) hypertension: Secondary | ICD-10-CM | POA: Diagnosis present

## 2011-04-01 DIAGNOSIS — E119 Type 2 diabetes mellitus without complications: Secondary | ICD-10-CM | POA: Diagnosis present

## 2011-04-01 DIAGNOSIS — I6529 Occlusion and stenosis of unspecified carotid artery: Secondary | ICD-10-CM

## 2011-04-01 DIAGNOSIS — Z8673 Personal history of transient ischemic attack (TIA), and cerebral infarction without residual deficits: Secondary | ICD-10-CM

## 2011-04-01 LAB — GLUCOSE, CAPILLARY: Glucose-Capillary: 131 mg/dL — ABNORMAL HIGH (ref 70–99)

## 2011-04-02 LAB — BASIC METABOLIC PANEL
BUN: 8 mg/dL (ref 6–23)
CO2: 27 mEq/L (ref 19–32)
Chloride: 99 mEq/L (ref 96–112)
Glucose, Bld: 113 mg/dL — ABNORMAL HIGH (ref 70–99)
Potassium: 4 mEq/L (ref 3.5–5.1)

## 2011-04-02 LAB — GLUCOSE, CAPILLARY
Glucose-Capillary: 183 mg/dL — ABNORMAL HIGH (ref 70–99)
Glucose-Capillary: 216 mg/dL — ABNORMAL HIGH (ref 70–99)

## 2011-04-02 LAB — CBC
HCT: 34 % — ABNORMAL LOW (ref 39.0–52.0)
Hemoglobin: 11.8 g/dL — ABNORMAL LOW (ref 13.0–17.0)
MCHC: 34.7 g/dL (ref 30.0–36.0)
RBC: 4.05 MIL/uL — ABNORMAL LOW (ref 4.22–5.81)
WBC: 4.6 10*3/uL (ref 4.0–10.5)

## 2011-04-12 ENCOUNTER — Ambulatory Visit (INDEPENDENT_AMBULATORY_CARE_PROVIDER_SITE_OTHER): Payer: Medicare Other | Admitting: Surgery

## 2011-04-12 ENCOUNTER — Encounter: Payer: Self-pay | Admitting: Surgery

## 2011-04-12 VITALS — BP 154/84 | HR 52 | Resp 22 | Ht 73.0 in | Wt 216.0 lb

## 2011-04-12 DIAGNOSIS — I6529 Occlusion and stenosis of unspecified carotid artery: Secondary | ICD-10-CM

## 2011-04-12 NOTE — Progress Notes (Signed)
[name] comes back today for followup. He is status post left carotid endarterectomy on August 16. 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 Intraoperative findings included a greater than 90% stenosis. The patient's postoperative course was uncomplicated. He has complained of some shooting pains into his head but these are improving.  On examination: His carotid incision is well healed there is a slight droop in the corner of his left mouth his tongue is midline neurologically he is intact  I will plan on seeing [name] back in 6 months with a repeat carotid ultrasound.  He is status post aortobifemoral bypass graft. He no longer has symptoms of claudication.

## 2011-04-15 NOTE — Consult Note (Signed)
  NAMEHEZZIE, WILCKEN                 ACCOUNT NO.:  [id number]  MEDICAL RECORD NO.:  [id number]  LOCATION:  XRAY                         FACILITY:  [facility]  PHYSICIAN:  Theotis Burrow IV, MDDATE OF BIRTH:  [birth year]  DATE OF CONSULTATION:  03/09/2011 DATE OF DISCHARGE:  03/09/2011                                CONSULTATION   I saw Mr. Sestak in the office yesterday on July 23.  He had complaints of amaurosis-like symptoms in his left eye.  He had been down to [facility] to see an ophthalmologist who felt this was a vascular occlusion of the retinal artery.  I reviewed the ultrasound that we had prior to his aortobifemoral bypass.  They suggest less than 50% stenosis in both carotids, however, visualization was somewhat obscured by plaque in the left carotid bifurcation.  Because of the patient's symptoms were very concerning for TIAs and amaurosis, I felt that his carotids needed to be imaged via a different modality.  I considered angiogram versus CTA.  I felt CTA was less invasive and could be a good place to start.  Negative study today.  The findings revealed probable greater than 70% stenosis with significant calcific plaque at the bifurcation extending up into the internal carotid artery.  No acute intracranial findings were found. After these results, I personally called Mr. Signer and relayed these to him.  I told him that I believe that his visual disturbances are related to this plaque and that if this is not addressed, I believe that he is at high risk for recurrent episodes and/or stroke.  I have recommended that we proceed with carotid endarterectomy as soon as possible.  I have recommended proceeding this Thursday, the day after tomorrow.  Again, I reiterated that he is at high risk for stroke.  I discussed the risks of the operation and the details of the operation as best I could over the telephone.  We discussed a 5% stroke rate.  Despite my urging, Mr. Fredericksen would like to  discuss this with his family this weekend and get back to me on Monday.  I have told him to continue taking his Plavix.  He cannot take aspirin.  Again, I encouraged him to have this done as soon as possible, preferably within the next day or two, but he wants to discuss this with his family and particular his son and will not be able to do that until this weekend.  He said he would contact my office on Monday.     Eldridge Abrahams, MD     VWB/MEDQ  D:  03/09/2011  T:  03/10/2011  Job:  [id number]  Electronically Signed by Orvan Falconer IV MD on 04/14/2011 11:59:09 PM

## 2011-04-15 NOTE — Op Note (Signed)
NAMELENELL, MAZMANIAN                 ACCOUNT NO.:  [id number]  MEDICAL RECORD NO.:  [id number]  LOCATION:  [id number]                         FACILITY:  [facility]  PHYSICIAN:  Theotis Burrow IV, MDDATE OF BIRTH:  [birth year]  DATE OF PROCEDURE:  04/01/2011 DATE OF DISCHARGE:                              OPERATIVE REPORT   PREOPERATIVE DIAGNOSIS:  Symptomatic left carotid stenosis.  POSTOPERATIVE DIAGNOSIS:  Symptomatic left carotid stenosis.  PROCEDURE PERFORMED:  Left carotid endarterectomy with patch angioplasty.  SURGEON: 1. Annamarie Major IV, MD  ASSISTANTS: 1. Judeth Cornfield. Scot Dock, MD 2. Wray Kearns, PA-C  ANESTHESIA:  General.  SPECIMENS:  Carotid plaque.  FINDINGS:  90% stenosis.  INDICATIONS:  Mr. Carlberg is a 65 year old gentleman who is status post aortobifemoral bypass graft.  His prebypass carotid Dopplers showed less than 80% stenosis bilaterally.  He was asymptomatic over the course of the past several weeks.  He has had several episodes of amaurosis.  When he told me of this, I repeated his carotid Dopplers which again showed less than 80% stenosis, however, there was a plaque at the bifurcation that could potentially be giving false values.  I therefore sent him for a CT scan which did show a large plaque at the bifurcation with significant stenosis.  I discussed this with Mr. Hartmann via the telephone recommending urgent carotid endarterectomy given his amaurosis.  He did not wish to have this done at that time and told me he would contact the office when he was ready.  He understood the risks of bleeding.  He has ultimately called and scheduled his operation which was scheduled for today.  His most recent episode of amaurosis was 3-4 days ago which lasted longer than normal for him.  He has no neurologic deficits, at baseline currently.  PROCEDURE:  The patient was identified in the holding area and taken to room #9, placed supine on the table.  General  endotracheal anesthesia was administered.  The patient was prepped and draped in the usual fashion.  A time-out was called.  Antibiotics were given.  An incision was made along the anterior border of the left sternocleidomastoid. Cautery was used to divided the subcutaneous tissue.  The platysma muscle was divided with cautery.  I then identified the common facial pain, which was circumferentially dissected free and ligated between 2-0 silk ties and metal clips.  I then carried down sharp dissection to the common carotid artery.  This was sharply dissected free and encircle with umbilical tape.  The vagus nerve was visualized on the posterior side of the artery and fully protected.  I then proceeded with cephalad dissection.  The superior thyroid was individually isolated with a Potts 2-0 silk tie and the external carotid artery was isolated.  The bifurcation was rather high.  I did identify the hypoglossal nerve and it was protected throughout the dissection.  Ultimately, I was able to get to the distal internal carotid artery that was disease free.  There was minimal manipulation of the internal carotid artery given the patient's clinical presentation.  Once I was satisfied with exposure, the patient was fully heparinized.  After heparin circulated, the internal carotid artery  was occluded with the baby Belenda Cruise clamps followed by occlusion of the external carotid artery with the baby Belenda Cruise and common carotid artery with peripheral DeBakey clamp.  A #11 blade was used to make an arteriotomy which was extended along the anterolateral border of the common and internal carotid artery.  The patient had a core reflect plaque which was nearly 90-95% stenosis at the bifurcation.  I evaluated the backbleeding from the internal carotid artery.  He had pulsatile backbleeding.  Because of the high bifurcation, I elected not to place a shunt.  Endarterectomy was then performed using [facility]  elevator.  Eversion endarterectomy was performed to the external carotid artery.  A good distal endpoint in the internal carotid artery was obtained and the plaque was removed.  The endarterectomized bed was then copiously irrigated.  All potential embolic debris was removed.  Next, a bovine pericardial patch was selected and the patch angioplasty was performed using a running 6-0 Prolene.  Prior to completion of the patch, the artery was again irrigated with heparinized saline.  Appropriate flushed maneuvers were performed and the anastomosis was completed.  The clamp on the external carotid artery was removed first followed by removal of the common carotid clamp.  Approximately 30 seconds later, the internal carotid clamp was removed.  Two repair stitches were required for hemostasis. Handheld Doppler was then used to evaluate signal in the common internal and external carotid arteries.  All had appropriate signals.  Next, the patient's heparin was reversed with 50 mg of protamine.  I inspected the wound.  He had generalized oozing from most tissues.  I felt this was most likely secondary to his Plavix taking.  The wound was irrigated again and I tried to address all the bleeding.  He still had a generalized ooze which was slow, but because of his Plavix I elected to place a drain.  A #15 Blake drain was placed and brought out through a separate stab incision, which was sewn in place with 3-0 nylon.  At this point, I closed the carotid sheath was running 3-0 Vicryl, the platysma muscle was closed with running 3-0 Vicryl, and skin was closed with running 4-0 Vicryl.  Dermabond was placed on the wound.     Eldridge Abrahams, MD     VWB/MEDQ  D:  04/01/2011  T:  04/01/2011  Job:  [id number]  Electronically Signed by Orvan Falconer IV MD on 04/14/2011 11:59:13 PM

## 2011-08-18 DIAGNOSIS — L253 Unspecified contact dermatitis due to other chemical products: Secondary | ICD-10-CM | POA: Diagnosis not present

## 2011-08-25 DIAGNOSIS — I6529 Occlusion and stenosis of unspecified carotid artery: Secondary | ICD-10-CM | POA: Diagnosis not present

## 2011-08-25 DIAGNOSIS — I10 Essential (primary) hypertension: Secondary | ICD-10-CM | POA: Diagnosis not present

## 2011-08-25 DIAGNOSIS — E1169 Type 2 diabetes mellitus with other specified complication: Secondary | ICD-10-CM | POA: Diagnosis not present

## 2011-08-25 DIAGNOSIS — E7800 Pure hypercholesterolemia, unspecified: Secondary | ICD-10-CM | POA: Diagnosis not present

## 2011-08-25 DIAGNOSIS — I70219 Atherosclerosis of native arteries of extremities with intermittent claudication, unspecified extremity: Secondary | ICD-10-CM | POA: Diagnosis not present

## 2011-09-07 DIAGNOSIS — E119 Type 2 diabetes mellitus without complications: Secondary | ICD-10-CM | POA: Diagnosis not present

## 2011-09-07 DIAGNOSIS — E871 Hypo-osmolality and hyponatremia: Secondary | ICD-10-CM | POA: Diagnosis not present

## 2011-09-07 DIAGNOSIS — E785 Hyperlipidemia, unspecified: Secondary | ICD-10-CM | POA: Diagnosis not present

## 2011-09-07 DIAGNOSIS — E559 Vitamin D deficiency, unspecified: Secondary | ICD-10-CM | POA: Diagnosis not present

## 2011-09-09 DIAGNOSIS — E119 Type 2 diabetes mellitus without complications: Secondary | ICD-10-CM | POA: Diagnosis not present

## 2011-09-09 DIAGNOSIS — E559 Vitamin D deficiency, unspecified: Secondary | ICD-10-CM | POA: Diagnosis not present

## 2011-09-09 DIAGNOSIS — E871 Hypo-osmolality and hyponatremia: Secondary | ICD-10-CM | POA: Diagnosis not present

## 2011-09-09 DIAGNOSIS — E785 Hyperlipidemia, unspecified: Secondary | ICD-10-CM | POA: Diagnosis not present

## 2011-09-13 DIAGNOSIS — N400 Enlarged prostate without lower urinary tract symptoms: Secondary | ICD-10-CM | POA: Diagnosis not present

## 2011-09-13 DIAGNOSIS — E1149 Type 2 diabetes mellitus with other diabetic neurological complication: Secondary | ICD-10-CM | POA: Diagnosis not present

## 2011-09-13 DIAGNOSIS — Z9109 Other allergy status, other than to drugs and biological substances: Secondary | ICD-10-CM | POA: Diagnosis not present

## 2011-09-13 DIAGNOSIS — K219 Gastro-esophageal reflux disease without esophagitis: Secondary | ICD-10-CM | POA: Diagnosis not present

## 2011-09-13 DIAGNOSIS — G609 Hereditary and idiopathic neuropathy, unspecified: Secondary | ICD-10-CM | POA: Diagnosis not present

## 2011-09-13 DIAGNOSIS — I70219 Atherosclerosis of native arteries of extremities with intermittent claudication, unspecified extremity: Secondary | ICD-10-CM | POA: Diagnosis not present

## 2011-09-13 DIAGNOSIS — R0989 Other specified symptoms and signs involving the circulatory and respiratory systems: Secondary | ICD-10-CM | POA: Diagnosis not present

## 2011-09-13 DIAGNOSIS — I10 Essential (primary) hypertension: Secondary | ICD-10-CM | POA: Diagnosis not present

## 2011-09-27 DIAGNOSIS — G894 Chronic pain syndrome: Secondary | ICD-10-CM | POA: Diagnosis not present

## 2011-09-27 DIAGNOSIS — S63509A Unspecified sprain of unspecified wrist, initial encounter: Secondary | ICD-10-CM | POA: Diagnosis not present

## 2011-09-27 DIAGNOSIS — M5137 Other intervertebral disc degeneration, lumbosacral region: Secondary | ICD-10-CM | POA: Diagnosis not present

## 2011-09-27 DIAGNOSIS — H5710 Ocular pain, unspecified eye: Secondary | ICD-10-CM | POA: Diagnosis not present

## 2011-09-27 DIAGNOSIS — Z79899 Other long term (current) drug therapy: Secondary | ICD-10-CM | POA: Diagnosis not present

## 2011-10-14 DIAGNOSIS — M5430 Sciatica, unspecified side: Secondary | ICD-10-CM | POA: Diagnosis not present

## 2011-10-15 ENCOUNTER — Encounter: Payer: Self-pay | Admitting: Surgery

## 2011-10-18 ENCOUNTER — Ambulatory Visit (INDEPENDENT_AMBULATORY_CARE_PROVIDER_SITE_OTHER): Payer: Medicare Other | Admitting: Surgery

## 2011-10-18 ENCOUNTER — Other Ambulatory Visit: Payer: Medicare Other

## 2011-10-18 ENCOUNTER — Encounter: Payer: Self-pay | Admitting: Surgery

## 2011-10-18 VITALS — BP 140/70 | HR 59 | Resp 16 | Ht 73.0 in | Wt 221.0 lb

## 2011-10-18 DIAGNOSIS — I6529 Occlusion and stenosis of unspecified carotid artery: Secondary | ICD-10-CM | POA: Diagnosis not present

## 2011-10-18 NOTE — Progress Notes (Signed)
[facility]   Patient name: [name] MRN: [id number] DOB: Sep 01, 1945 Sex: male     Chief Complaint  Patient presents with  . Carotid    6 month f/u ,  had duplex done at Dr. Irven Shelling office    HISTORY OF PRESENT ILLNESS: The patient is back today for followup. He is status post aortobifemoral bypass graft with reimplantation of his inferior mesenteric artery on 10/08/2010. He is also status post left carotid endarterectomy on 04/01/2011.in the setting of retinal artery occlusion. Patient has no complaints today. He states is doing very well. He denies any neurologic symptoms. He has no new visual acuity problems. He has no symptoms of claudication.  Past Medical History  Diagnosis Date  . DJD (degenerative joint disease)     L wrist  . Lumbar disc disease   . HTN (hypertension)   . Anemia     NOS  . Osteoporosis     tx per [facility], Dr Prudencio Burly, thought due to heavy steriod use after 1978  . Spine fracture     hx, multiple  . Blindness of left eye     near blindness. 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 GERD (gastroesophageal reflux disease)   . DM2 (diabetes mellitus, type 2)   . Anxiety   . Depression   . BPH (benign prostatic hypertrophy)   . HLD (hyperlipidemia)   . Chronic pain syndrome   . Chronic hyponatremia     Past Surgical History  Procedure Date  . Right hand fracture 1969  . Left ankle ganglion cyst 1976  . Lumbar disc surgery 1981, 2004  . Nasal sinus surgery     multiple- x8. last 1997 with obliteration  . Left cataract 1996    right - 1997  . Left foot surgery 1998    R surgery/fracture - 1999  . Left cts 2006    R CTS - 2007  . Left wrist/hand fusion 2008  . L foot open repair jones fracture 2010     5th metetarsal   . Rotator cuff repair 2006    R, than repeat 2011, Dr Theda Sers  . Vocal surgery 1996    History   Social History  . Marital Status: Divorced    Spouse Name: N/A    Number of Children: N/A  . Years of Education: N/A    Occupational History  . Not on file.   Social History Main Topics  . Smoking status: Former Research scientist (life sciences)  . Smokeless tobacco: Not on file  . Alcohol Use: No  . Drug Use: No  . Sexually Active: Not on file   Other Topics Concern  . Not on file   Social History Narrative   Divorced. 2 adult sons - 51 with DM. Disabled after [place] accident with back injury 1978.     Family History  Problem Relation Age of Onset  . Lung cancer Father     and grandmother  . Throat cancer      family hx - "bad living" - also lung CA, heart disease and renal failure    . Diabetes Mother     DM, also sister and MGF     Allergies as of 10/18/2011 - Review Complete 10/18/2011  Allergen Reaction Noted  . Ivp dye (iodinated diagnostic agents)  09/21/2010  . Penicillins  03/13/2010  . Propoxyphene n-acetaminophen    . Sulfonamide derivatives      Current Outpatient Prescriptions on File Prior to Visit  Medication  Sig Dispense Refill  . clonazePAM (KLONOPIN) 1 MG tablet Take 1 mg by mouth 3 (three) times daily as needed.        . clopidogrel (PLAVIX) 75 MG tablet Take 75 mg by mouth daily.        . fentaNYL (DURAGESIC - DOSED MCG/HR) 100 MCG/HR Place 1 patch onto the skin every 3 (three) days. Also 1 for 25 mcg/hr       . Multiple Vitamin (MULTIVITAMIN) tablet Take 1 tablet by mouth daily.        Marland Kitchen omeprazole (PRILOSEC) 40 MG capsule Take 40 mg by mouth 2 (two) times daily. 1 in am and 1 in pm       . oxyCODONE-acetaminophen (PERCOCET) 10-325 MG per tablet Take 1 tablet by mouth 4 (four) times daily as needed.        . sitaGLIPtin (JANUVIA) 50 MG tablet Take 100 mg by mouth daily.       . Tamsulosin HCl (FLOMAX) 0.4 MG CAPS Take 0.4 mg by mouth daily.        Marland Kitchen zonisamide (ZONEGRAN) 50 MG capsule Take 150 mg by mouth at bedtime.        Marland Kitchen buPROPion (WELLBUTRIN SR) 100 MG 12 hr tablet Take 100 mg by mouth 3 (three) times daily.        . celecoxib (CELEBREX) 100 MG capsule Take 100 mg by mouth 2  (two) times daily.        . Cholecalciferol (VITAMIN D) 1000 UNITS capsule Take 1,000 Units by mouth daily.        . cyclobenzaprine (FLEXERIL) 5 MG tablet Take 5 mg by mouth 3 (three) times daily.        . diclofenac sodium (VOLTAREN) 1 % GEL Apply topically 2 (two) times daily.        . enalapril (VASOTEC) 5 MG tablet Take 5 mg by mouth daily.        Marland Kitchen L-Methylfolate-B6-B12 (METANX) 3-35-2 MG TABS Take 1 tablet by mouth 2 (two) times daily.        Marland Kitchen oral electrolytes (THERMOTABS) TABS Take 4-5 tablets by mouth daily.        . simvastatin (ZOCOR) 40 MG tablet Take 40 mg by mouth daily.        . Teriparatide, Recombinant, (FORTEO) 600 MCG/2.4ML SOLN Inject into the skin. UAD          REVIEW OF SYSTEMS: No change since prior visit  PHYSICAL EXAMINATION:   Vital signs are BP 140/70  Pulse 59  Resp 16  Ht 6\' 1"  (1.854 m)  Wt 221 lb (100.245 kg)  BMI 29.16 kg/m2  SpO2 98% General: The patient appears their stated age. HEENT:  No gross abnormalities Pulmonary:  Non labored breathing Abdomen: Soft and non-tender midline incision is well-healed. There is no evidence of hernia. Musculoskeletal: There are no major deformities. Neurologic: No focal weakness or paresthesias are detected, Skin: There are no ulcer or rashes noted. Well-healed left neck incision Psychiatric: The patient has normal affect. Cardiovascular: Palpable posterior tibial pulses bilaterally   Diagnostic Studies I have reviewed his outside duplex. This shows 50-69% carotid stenosis bilaterally  Assessment: Status post aortobifemoral bypass graft and left carotid endarterectomy.  Plan: The patient will continue to have a surveillance ultrasound done at [facility] office. We discussed the carotid ultrasound findings today. Should these progressed to greater than 80% consider cerebral angiography. Most likely however these will remain stable. With regards to his claudication. His symptoms have  resolved with  aortobifemoral bypass grafting. He has no complications from this at this time. L. plan on having him commence see me in one year  V. Leia Alf, M.D. [facility] [place] Office: [phone] Pager:  [phone]

## 2011-10-29 ENCOUNTER — Ambulatory Visit: Payer: Medicare Other | Admitting: Physical Therapy

## 2011-10-29 DIAGNOSIS — M545 Low back pain, unspecified: Secondary | ICD-10-CM | POA: Diagnosis not present

## 2011-11-01 ENCOUNTER — Other Ambulatory Visit: Payer: Self-pay | Admitting: Family Medicine

## 2011-11-01 DIAGNOSIS — M545 Low back pain: Secondary | ICD-10-CM

## 2011-11-01 DIAGNOSIS — R29898 Other symptoms and signs involving the musculoskeletal system: Secondary | ICD-10-CM

## 2011-11-01 DIAGNOSIS — R200 Anesthesia of skin: Secondary | ICD-10-CM

## 2011-11-04 ENCOUNTER — Ambulatory Visit
Admission: RE | Admit: 2011-11-04 | Discharge: 2011-11-04 | Disposition: A | Discharge status: Home / Self Care | Payer: Medicare Other | Source: Ambulatory Visit | Attending: Family Medicine | Admitting: Family Medicine

## 2011-11-04 DIAGNOSIS — M5137 Other intervertebral disc degeneration, lumbosacral region: Secondary | ICD-10-CM | POA: Diagnosis not present

## 2011-11-04 DIAGNOSIS — R200 Anesthesia of skin: Secondary | ICD-10-CM

## 2011-11-04 DIAGNOSIS — M5126 Other intervertebral disc displacement, lumbar region: Secondary | ICD-10-CM | POA: Diagnosis not present

## 2011-11-04 DIAGNOSIS — M47817 Spondylosis without myelopathy or radiculopathy, lumbosacral region: Secondary | ICD-10-CM | POA: Diagnosis not present

## 2011-11-04 DIAGNOSIS — M545 Low back pain: Secondary | ICD-10-CM

## 2011-11-04 DIAGNOSIS — R29898 Other symptoms and signs involving the musculoskeletal system: Secondary | ICD-10-CM

## 2011-11-15 DIAGNOSIS — H5710 Ocular pain, unspecified eye: Secondary | ICD-10-CM | POA: Diagnosis not present

## 2011-11-15 DIAGNOSIS — M5137 Other intervertebral disc degeneration, lumbosacral region: Secondary | ICD-10-CM | POA: Diagnosis not present

## 2011-11-15 DIAGNOSIS — G894 Chronic pain syndrome: Secondary | ICD-10-CM | POA: Diagnosis not present

## 2011-11-23 DIAGNOSIS — E559 Vitamin D deficiency, unspecified: Secondary | ICD-10-CM | POA: Diagnosis not present

## 2011-11-23 DIAGNOSIS — E119 Type 2 diabetes mellitus without complications: Secondary | ICD-10-CM | POA: Diagnosis not present

## 2011-11-23 DIAGNOSIS — E785 Hyperlipidemia, unspecified: Secondary | ICD-10-CM | POA: Diagnosis not present

## 2011-11-24 DIAGNOSIS — E871 Hypo-osmolality and hyponatremia: Secondary | ICD-10-CM | POA: Diagnosis not present

## 2011-11-24 DIAGNOSIS — E785 Hyperlipidemia, unspecified: Secondary | ICD-10-CM | POA: Diagnosis not present

## 2011-11-24 DIAGNOSIS — E119 Type 2 diabetes mellitus without complications: Secondary | ICD-10-CM | POA: Diagnosis not present

## 2011-11-24 DIAGNOSIS — R5381 Other malaise: Secondary | ICD-10-CM | POA: Diagnosis not present

## 2011-11-24 DIAGNOSIS — R5383 Other fatigue: Secondary | ICD-10-CM | POA: Diagnosis not present

## 2011-11-30 DIAGNOSIS — M48061 Spinal stenosis, lumbar region without neurogenic claudication: Secondary | ICD-10-CM | POA: Diagnosis not present

## 2011-11-30 DIAGNOSIS — M4310 Spondylolisthesis, site unspecified: Secondary | ICD-10-CM | POA: Diagnosis not present

## 2011-11-30 DIAGNOSIS — M545 Low back pain: Secondary | ICD-10-CM | POA: Diagnosis not present

## 2012-01-06 ENCOUNTER — Emergency Department (HOSPITAL_COMMUNITY)
Admission: EM | Admit: 2012-01-06 | Discharge: 2012-01-07 | Discharge status: Against Medical Advice | Payer: Medicare Other | Attending: Emergency Medicine | Admitting: Emergency Medicine

## 2012-01-06 ENCOUNTER — Encounter (HOSPITAL_COMMUNITY): Payer: Self-pay | Admitting: Emergency Medicine

## 2012-01-06 DIAGNOSIS — R609 Edema, unspecified: Secondary | ICD-10-CM | POA: Diagnosis not present

## 2012-01-06 NOTE — ED Notes (Signed)
Unable to locate patient.

## 2012-01-06 NOTE — ED Notes (Addendum)
Patient with bilateral leg swelling.  Patient with some shortness of breath.  Patient just noticed today, was told to come to ED.  He feels like he is full.

## 2012-01-07 ENCOUNTER — Ambulatory Visit (HOSPITAL_COMMUNITY): Admission: RE | Admit: 2012-01-07 | Payer: Medicare Other | Source: Ambulatory Visit

## 2012-01-07 DIAGNOSIS — I70219 Atherosclerosis of native arteries of extremities with intermittent claudication, unspecified extremity: Secondary | ICD-10-CM | POA: Diagnosis not present

## 2012-01-07 DIAGNOSIS — I6529 Occlusion and stenosis of unspecified carotid artery: Secondary | ICD-10-CM | POA: Diagnosis not present

## 2012-01-07 DIAGNOSIS — R609 Edema, unspecified: Secondary | ICD-10-CM | POA: Diagnosis not present

## 2012-01-07 DIAGNOSIS — E871 Hypo-osmolality and hyponatremia: Secondary | ICD-10-CM | POA: Diagnosis not present

## 2012-01-07 DIAGNOSIS — E1169 Type 2 diabetes mellitus with other specified complication: Secondary | ICD-10-CM | POA: Diagnosis not present

## 2012-01-07 NOTE — ED Notes (Signed)
Patient not in ED at this time.

## 2012-01-07 NOTE — ED Notes (Signed)
I did not see this patient as he left prior to being seen by provider  Kalman Drape, MD 01/07/12 [phone]

## 2012-01-11 DIAGNOSIS — M545 Low back pain: Secondary | ICD-10-CM | POA: Diagnosis not present

## 2012-01-12 DIAGNOSIS — R209 Unspecified disturbances of skin sensation: Secondary | ICD-10-CM | POA: Diagnosis not present

## 2012-01-12 DIAGNOSIS — M7700 Medial epicondylitis, unspecified elbow: Secondary | ICD-10-CM | POA: Diagnosis not present

## 2012-01-19 DIAGNOSIS — I150 Renovascular hypertension: Secondary | ICD-10-CM | POA: Diagnosis not present

## 2012-01-19 DIAGNOSIS — R609 Edema, unspecified: Secondary | ICD-10-CM | POA: Diagnosis not present

## 2012-01-20 DIAGNOSIS — Z79899 Other long term (current) drug therapy: Secondary | ICD-10-CM | POA: Diagnosis not present

## 2012-01-20 DIAGNOSIS — G894 Chronic pain syndrome: Secondary | ICD-10-CM | POA: Diagnosis not present

## 2012-01-20 DIAGNOSIS — M5137 Other intervertebral disc degeneration, lumbosacral region: Secondary | ICD-10-CM | POA: Diagnosis not present

## 2012-01-20 DIAGNOSIS — H5710 Ocular pain, unspecified eye: Secondary | ICD-10-CM | POA: Diagnosis not present

## 2012-01-25 ENCOUNTER — Other Ambulatory Visit: Payer: Self-pay

## 2012-01-25 DIAGNOSIS — L98499 Non-pressure chronic ulcer of skin of other sites with unspecified severity: Secondary | ICD-10-CM | POA: Diagnosis not present

## 2012-01-25 DIAGNOSIS — R21 Rash and other nonspecific skin eruption: Secondary | ICD-10-CM | POA: Diagnosis not present

## 2012-01-25 DIAGNOSIS — D485 Neoplasm of uncertain behavior of skin: Secondary | ICD-10-CM | POA: Diagnosis not present

## 2012-01-25 DIAGNOSIS — L988 Other specified disorders of the skin and subcutaneous tissue: Secondary | ICD-10-CM | POA: Diagnosis not present

## 2012-01-27 DIAGNOSIS — R609 Edema, unspecified: Secondary | ICD-10-CM | POA: Diagnosis not present

## 2012-02-09 DIAGNOSIS — M25519 Pain in unspecified shoulder: Secondary | ICD-10-CM | POA: Diagnosis not present

## 2012-02-09 DIAGNOSIS — R609 Edema, unspecified: Secondary | ICD-10-CM | POA: Diagnosis not present

## 2012-02-28 DIAGNOSIS — E871 Hypo-osmolality and hyponatremia: Secondary | ICD-10-CM | POA: Diagnosis not present

## 2012-02-28 DIAGNOSIS — E119 Type 2 diabetes mellitus without complications: Secondary | ICD-10-CM | POA: Diagnosis not present

## 2012-03-01 DIAGNOSIS — E871 Hypo-osmolality and hyponatremia: Secondary | ICD-10-CM | POA: Diagnosis not present

## 2012-03-01 DIAGNOSIS — E119 Type 2 diabetes mellitus without complications: Secondary | ICD-10-CM | POA: Diagnosis not present

## 2012-03-01 DIAGNOSIS — E785 Hyperlipidemia, unspecified: Secondary | ICD-10-CM | POA: Diagnosis not present

## 2012-03-01 DIAGNOSIS — E559 Vitamin D deficiency, unspecified: Secondary | ICD-10-CM | POA: Diagnosis not present

## 2012-03-10 DIAGNOSIS — M75120 Complete rotator cuff tear or rupture of unspecified shoulder, not specified as traumatic: Secondary | ICD-10-CM | POA: Diagnosis not present

## 2012-03-16 DIAGNOSIS — R269 Unspecified abnormalities of gait and mobility: Secondary | ICD-10-CM | POA: Diagnosis not present

## 2012-03-16 DIAGNOSIS — Z0000 Encounter for general adult medical examination without abnormal findings: Secondary | ICD-10-CM | POA: Diagnosis not present

## 2012-03-16 DIAGNOSIS — Z125 Encounter for screening for malignant neoplasm of prostate: Secondary | ICD-10-CM | POA: Diagnosis not present

## 2012-03-28 DIAGNOSIS — E871 Hypo-osmolality and hyponatremia: Secondary | ICD-10-CM | POA: Diagnosis not present

## 2012-03-30 DIAGNOSIS — E119 Type 2 diabetes mellitus without complications: Secondary | ICD-10-CM | POA: Diagnosis not present

## 2012-03-30 DIAGNOSIS — E871 Hypo-osmolality and hyponatremia: Secondary | ICD-10-CM | POA: Diagnosis not present

## 2012-04-11 DIAGNOSIS — M545 Low back pain: Secondary | ICD-10-CM | POA: Diagnosis not present

## 2012-04-13 ENCOUNTER — Encounter: Payer: Medicare Other | Admitting: Physical Therapy

## 2012-04-13 ENCOUNTER — Ambulatory Visit: Payer: Medicare Other | Attending: Family Medicine | Admitting: *Deleted

## 2012-04-13 DIAGNOSIS — IMO0001 Reserved for inherently not codable concepts without codable children: Secondary | ICD-10-CM | POA: Diagnosis not present

## 2012-04-13 DIAGNOSIS — R279 Unspecified lack of coordination: Secondary | ICD-10-CM | POA: Insufficient documentation

## 2012-04-13 DIAGNOSIS — R269 Unspecified abnormalities of gait and mobility: Secondary | ICD-10-CM | POA: Diagnosis not present

## 2012-04-14 ENCOUNTER — Ambulatory Visit: Payer: Medicare Other | Admitting: *Deleted

## 2012-04-20 ENCOUNTER — Ambulatory Visit: Payer: Medicare Other | Attending: Family Medicine | Admitting: *Deleted

## 2012-04-20 DIAGNOSIS — R279 Unspecified lack of coordination: Secondary | ICD-10-CM | POA: Diagnosis not present

## 2012-04-20 DIAGNOSIS — R269 Unspecified abnormalities of gait and mobility: Secondary | ICD-10-CM | POA: Diagnosis not present

## 2012-04-20 DIAGNOSIS — IMO0001 Reserved for inherently not codable concepts without codable children: Secondary | ICD-10-CM | POA: Diagnosis not present

## 2012-04-27 ENCOUNTER — Ambulatory Visit: Payer: Medicare Other | Admitting: *Deleted

## 2012-05-02 ENCOUNTER — Ambulatory Visit: Payer: Medicare Other | Admitting: *Deleted

## 2012-05-04 ENCOUNTER — Ambulatory Visit: Payer: Medicare Other | Admitting: Physical Therapy

## 2012-05-09 ENCOUNTER — Ambulatory Visit: Payer: Medicare Other | Admitting: Physical Therapy

## 2012-05-11 ENCOUNTER — Ambulatory Visit: Payer: Medicare Other | Admitting: Physical Therapy

## 2012-05-16 ENCOUNTER — Ambulatory Visit: Payer: Medicare Other | Attending: Family Medicine | Admitting: Physical Therapy

## 2012-05-16 DIAGNOSIS — R279 Unspecified lack of coordination: Secondary | ICD-10-CM | POA: Insufficient documentation

## 2012-05-16 DIAGNOSIS — R269 Unspecified abnormalities of gait and mobility: Secondary | ICD-10-CM | POA: Diagnosis not present

## 2012-05-16 DIAGNOSIS — IMO0001 Reserved for inherently not codable concepts without codable children: Secondary | ICD-10-CM | POA: Insufficient documentation

## 2012-05-18 ENCOUNTER — Ambulatory Visit: Payer: Medicare Other | Admitting: Physical Therapy

## 2012-05-22 ENCOUNTER — Ambulatory Visit: Payer: Medicare Other | Admitting: Physical Therapy

## 2012-05-25 ENCOUNTER — Ambulatory Visit: Payer: Medicare Other | Admitting: Physical Therapy

## 2012-05-27 ENCOUNTER — Encounter (HOSPITAL_COMMUNITY): Payer: Self-pay | Admitting: Emergency Medicine

## 2012-05-27 ENCOUNTER — Emergency Department (HOSPITAL_COMMUNITY): Payer: Medicare Other

## 2012-05-27 ENCOUNTER — Emergency Department (HOSPITAL_COMMUNITY)
Admission: EM | Admit: 2012-05-27 | Discharge: 2012-05-27 | Disposition: A | Discharge status: Home / Self Care | Payer: Medicare Other | Attending: Emergency Medicine | Admitting: Emergency Medicine

## 2012-05-27 DIAGNOSIS — M810 Age-related osteoporosis without current pathological fracture: Secondary | ICD-10-CM | POA: Diagnosis not present

## 2012-05-27 DIAGNOSIS — E785 Hyperlipidemia, unspecified: Secondary | ICD-10-CM | POA: Diagnosis not present

## 2012-05-27 DIAGNOSIS — F3289 Other specified depressive episodes: Secondary | ICD-10-CM | POA: Insufficient documentation

## 2012-05-27 DIAGNOSIS — M549 Dorsalgia, unspecified: Secondary | ICD-10-CM | POA: Diagnosis not present

## 2012-05-27 DIAGNOSIS — M19039 Primary osteoarthritis, unspecified wrist: Secondary | ICD-10-CM | POA: Diagnosis not present

## 2012-05-27 DIAGNOSIS — M79609 Pain in unspecified limb: Secondary | ICD-10-CM | POA: Insufficient documentation

## 2012-05-27 DIAGNOSIS — IMO0002 Reserved for concepts with insufficient information to code with codable children: Secondary | ICD-10-CM | POA: Diagnosis not present

## 2012-05-27 DIAGNOSIS — F411 Generalized anxiety disorder: Secondary | ICD-10-CM | POA: Diagnosis not present

## 2012-05-27 DIAGNOSIS — I10 Essential (primary) hypertension: Secondary | ICD-10-CM | POA: Diagnosis not present

## 2012-05-27 DIAGNOSIS — S92313A Displaced fracture of first metatarsal bone, unspecified foot, initial encounter for closed fracture: Secondary | ICD-10-CM

## 2012-05-27 DIAGNOSIS — M25579 Pain in unspecified ankle and joints of unspecified foot: Secondary | ICD-10-CM | POA: Diagnosis not present

## 2012-05-27 DIAGNOSIS — F329 Major depressive disorder, single episode, unspecified: Secondary | ICD-10-CM | POA: Insufficient documentation

## 2012-05-27 DIAGNOSIS — M25476 Effusion, unspecified foot: Secondary | ICD-10-CM | POA: Insufficient documentation

## 2012-05-27 DIAGNOSIS — R6889 Other general symptoms and signs: Secondary | ICD-10-CM | POA: Diagnosis not present

## 2012-05-27 DIAGNOSIS — W010XXA Fall on same level from slipping, tripping and stumbling without subsequent striking against object, initial encounter: Secondary | ICD-10-CM | POA: Insufficient documentation

## 2012-05-27 DIAGNOSIS — E119 Type 2 diabetes mellitus without complications: Secondary | ICD-10-CM | POA: Insufficient documentation

## 2012-05-27 DIAGNOSIS — K219 Gastro-esophageal reflux disease without esophagitis: Secondary | ICD-10-CM | POA: Insufficient documentation

## 2012-05-27 DIAGNOSIS — G894 Chronic pain syndrome: Secondary | ICD-10-CM | POA: Diagnosis not present

## 2012-05-27 DIAGNOSIS — M25473 Effusion, unspecified ankle: Secondary | ICD-10-CM | POA: Diagnosis not present

## 2012-05-27 DIAGNOSIS — S92909A Unspecified fracture of unspecified foot, initial encounter for closed fracture: Secondary | ICD-10-CM

## 2012-05-27 DIAGNOSIS — S92309A Fracture of unspecified metatarsal bone(s), unspecified foot, initial encounter for closed fracture: Secondary | ICD-10-CM | POA: Diagnosis not present

## 2012-05-27 DIAGNOSIS — Z79899 Other long term (current) drug therapy: Secondary | ICD-10-CM | POA: Insufficient documentation

## 2012-05-27 HISTORY — DX: Unspecified fracture of unspecified foot, initial encounter for closed fracture: S92.909A

## 2012-05-27 MED ORDER — KETOROLAC TROMETHAMINE 15 MG/ML IJ SOLN
15.0000 mg | Freq: Once | INTRAMUSCULAR | Status: AC
  Administered 2012-05-27: 15 mg via INTRAVENOUS
  Filled 2012-05-27: qty 1

## 2012-05-27 MED ORDER — OXYCODONE-ACETAMINOPHEN 5-325 MG PO TABS: 2.0000 | ORAL_TABLET | ORAL | Status: DC | PRN

## 2012-05-27 NOTE — Progress Notes (Signed)
NCM contacted [facility] to ensure delivery of wheelchair for pt. States he will deliver for scheduled d/c today. Jonnie Finner RN CCM Case Mgmt phone [phone]

## 2012-05-27 NOTE — ED Notes (Signed)
Patient transported to X-ray 

## 2012-05-27 NOTE — Progress Notes (Signed)
Orthopedic Tech Progress Note Patient Details:  [name] 09-01-1945 [id number] Short Leg splint applied to Right LE (stirrup and post.). Application tolerated well. Patient refused crutches; stated he could not use them. ED contacting case management for other options for patient. Ortho Devices Type of Ortho Device: Short leg splint;Stirrup splint;Post (short) splint Splint Material: Fiberglass Ortho Device/Splint Location: Right LE Ortho Device/Splint Interventions: Application   Asia R Thompson 05/27/2012, 12:31 PM

## 2012-05-27 NOTE — ED Notes (Signed)
Ortho at the bedside to place splint.

## 2012-05-27 NOTE — ED Notes (Signed)
Pt arrived by EMS. Pt fell this morning while going to the bathroom. Pt stated that he tripped over a chair and c/o right foot pain. There is some edema noted to right foot. Abrasions to left shoulder and left knee. Pt self administered prescribed Percocet around 0715. Pt also has a Fentanyl 74mcg patch that is on left thigh.   Denies loss of consciousness and hitting head.

## 2012-05-27 NOTE — Progress Notes (Signed)
NCM contacted pt and at home. States he really needs wheelchair this evening. NCM did follow up with [facility] and they will deliver to home this evening. They did call pt and made him aware.  Jonnie Finner RN CCM Case Mgmt phone [phone]

## 2012-05-27 NOTE — ED Notes (Signed)
[facility] came to take pt home d/t pt not having a ride home or any money for a cab.

## 2012-05-27 NOTE — ED Provider Notes (Signed)
History     CSN: [id number]  Arrival date & time 05/27/12  [id number]   First MD Initiated Contact with Patient 05/27/12 [id number]      Chief Complaint  Patient presents with  . Foot Injury    (Consider location/radiation/quality/duration/timing/severity/associated sxs/prior treatment) HPI Comments: This is a 66 year old male, PMH osteoporosis, who presents to the ED after tripping on a chair this morning.  The patient states that he was going to use the bathroom when he tripped.  He states that he hit his shoulder and bumped his head during the fall.  He did not lose consciousness.  He denies headache.  He states that he is only concerned about his foot.  His pain is 8/10.  He has chronic pain syndrome which is managed with oxycontin and a fentanyl patch on his left thigh.  Patient states that the pain does not radiate.  The history is provided by the patient. No language interpreter was used.    Past Medical History  Diagnosis Date  . DJD (degenerative joint disease)     L wrist  . Lumbar disc disease   . HTN (hypertension)   . Anemia     NOS  . Osteoporosis     tx per [facility], Dr Prudencio Burly, thought due to heavy steriod use after 1978  . Spine fracture     hx, multiple  . Blindness of left eye     near blindness. 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 GERD (gastroesophageal reflux disease)   . DM2 (diabetes mellitus, type 2)   . Anxiety   . Depression   . BPH (benign prostatic hypertrophy)   . HLD (hyperlipidemia)   . Chronic pain syndrome   . Chronic hyponatremia   . Diabetes mellitus     Past Surgical History  Procedure Date  . Right hand fracture 1969  . Left ankle ganglion cyst 1976  . Lumbar disc surgery 1981, 2004  . Nasal sinus surgery     multiple- x8. last 1997 with obliteration  . Left cataract 1996    right - 1997  . Left foot surgery 1998    R surgery/fracture - 1999  . Left cts 2006    R CTS - 2007  . Left wrist/hand fusion 2008  . L foot open repair jones fracture 2010     5th  metetarsal   . Rotator cuff repair 2006    R, than repeat 2011, Dr Theda Sers  . Vocal surgery 1996  . Repair thoracic aorta     Family History  Problem Relation Age of Onset  . Lung cancer Father     and grandmother  . Throat cancer      family hx - "bad living" - also lung CA, heart disease and renal failure    . Diabetes Mother     DM, also sister and MGF     History  Substance Use Topics  . Smoking status: Former Research scientist (life sciences)  . Smokeless tobacco: Not on file  . Alcohol Use: No      Review of Systems  Constitutional: Negative for fever.  HENT: Negative for neck pain.   Eyes: Negative for visual disturbance.  Respiratory: Negative for chest tightness and shortness of breath.   Cardiovascular: Negative for chest pain and palpitations.  Gastrointestinal: Negative for nausea, vomiting, abdominal pain and diarrhea.  Genitourinary: Negative for dysuria.  Musculoskeletal: Positive for back pain and joint swelling.       Chronic back pain, new pain in  right foot  Skin: Negative for rash and wound.  Neurological: Negative for weakness.  Psychiatric/Behavioral: The patient is not nervous/anxious.   All other systems reviewed and are negative.    Allergies  Ivp dye; Penicillins; Propoxyphene-acetaminophen; and Sulfonamide derivatives  Home Medications   Current Outpatient Rx  Name Route Sig Dispense Refill  . BUPROPION HCL ER (XL) 300 MG PO TB24 Oral Take 300 mg by mouth every morning.     Marland Kitchen CARVEDILOL 6.25 MG PO TABS Oral Take 6.25 mg by mouth daily.     . CELECOXIB 100 MG PO CAPS Oral Take 100 mg by mouth 2 (two) times daily as needed. For pain    . VITAMIN D 1000 UNITS PO CAPS Oral Take 1,000 Units by mouth daily.      Marland Kitchen CLONAZEPAM 1 MG PO TABS Oral Take 1 mg by mouth 3 (three) times daily.     . FENTANYL 75 MCG/HR TD PT72 Transdermal Place 1 patch onto the skin every 3 (three) days. For pain    . FLUDROCORTISONE ACETATE 0.1 MG PO TABS Oral Take 0.1 mg by mouth daily.       Marland Kitchen GLIMEPIRIDE 1 MG PO TABS Oral Take 1 mg by mouth daily before breakfast.    . VITACIRC-B 3-35-2 MG PO TABS Oral Take 1 tablet by mouth daily.    Marland Kitchen OMEPRAZOLE 40 MG PO CPDR Oral Take 40 mg by mouth 2 (two) times daily.     . OXYCODONE-ACETAMINOPHEN 10-325 MG PO TABS Oral Take 1 tablet by mouth 4 (four) times daily as needed. For pain    . SERTRALINE HCL 100 MG PO TABS Oral Take 100 mg by mouth daily.     Marland Kitchen SITAGLIPTIN PHOSPHATE 50 MG PO TABS Oral Take 100 mg by mouth daily.     . SODIUM & POTASSIUM BICARBONATE PO Oral Take 2 tablets by mouth 4 (four) times daily.    Marland Kitchen TAMSULOSIN HCL 0.4 MG PO CAPS Oral Take 0.8 mg by mouth 2 (two) times daily.     Marland Kitchen ZONISAMIDE 50 MG PO CAPS Oral Take 150 mg by mouth at bedtime.        BP 107/68  Pulse 60  Temp 98.3 F (36.8 C) (Oral)  SpO2 94%  Physical Exam  Nursing note and vitals reviewed. Constitutional: He is oriented to person, place, and time. He appears well-developed and well-nourished.  HENT:  Head: Normocephalic and atraumatic.  Eyes: Conjunctivae normal and EOM are normal. Pupils are equal, round, and reactive to light.  Neck: Normal range of motion. Neck supple.  Cardiovascular: Normal rate, regular rhythm and normal heart sounds.   Pulmonary/Chest: Effort normal and breath sounds normal.  Abdominal: Soft.  Musculoskeletal: He exhibits edema and tenderness.       Right ankle swollen, diffuse tenderness to palpation over dorsal right foot, some swelling and bruising noted, toes are cool, but good cap refill.  Neurological: He is alert and oriented to person, place, and time.  Skin: Skin is warm and dry.  Psychiatric: He has a normal mood and affect. His behavior is normal. Judgment and thought content normal.    ED Course  Procedures (including critical care time)  Labs Reviewed - No data to display No results found.   1. Fracture of 1st metatarsal       MDM  66 year old male with right foot first metatarsal fracture.   This patient has been seen by and discussed with Dr. Thurnell Garbe, who consulted Ortho.  The patient will be placed in a short-leg splint with side stirrup and discharged to home.  The patient is to follow-up with Dr. Doran Durand from [place] on Monday.  Patient given crutches, though he says he cannot use them.  Will get in touch with social work regarding wheel chair.  2:54 PM Patient will be given a light weight wheel chair, as he is unable to use crutches and regular weight wheel chair due to arm weakness. Return precautions have been given.  Follow-up with Dr. Doran Durand on Monday.  The patient is stable and ready for discharge.          Montine Circle, PA-C 05/27/12 1457

## 2012-05-27 NOTE — ED Notes (Signed)
Ortho called for splint  

## 2012-05-27 NOTE — ED Provider Notes (Signed)
66yo M, s/p trip and fall this morning, c/o right foot pain, hx of previous fx.  +TTP right 1st metatarsal area. No open wounds, no edema, no obvious deformity.    Dg Ankle Complete Right 05/27/2012  *RADIOLOGY REPORT*  Clinical Data: History of trauma from a fall complaining of pain in the right ankle.  RIGHT ANKLE - COMPLETE 3+ VIEW  Comparison: No priors.  Findings: Three views of the right ankle demonstrate no acute fracture, subluxation or dislocation.  Postoperative changes of ORIF in the base of the fifth metatarsal are incompletely visualized.  IMPRESSION: 1.  No acute radiographic abnormality of the right ankle.   Original Report Authenticated By: Etheleen Mayhew, M.D.    Dg Foot Complete Right 05/27/2012  *RADIOLOGY REPORT*  Clinical Data: History of fall complaining of pain in the right foot.  RIGHT FOOT COMPLETE - 3+ VIEW  Comparison: No priors.  Findings: Three views of the right foot demonstrate a comminuted intra-articular fracture at the base of the first metatarsal with slight distraction of the fracture fragments.  There is also irregularity of the distal second and third metatarsals and, however, this appearance is favored to reflect sequela of remote trauma.  Postoperative changes of ORIF the base of the fifth metatarsal are also noted with a long fixation screw in place.  IMPRESSION: 1.  Mildly comminuted and slightly distracted intra-articular fracture at the base of the first metatarsal. 2.  Old post-traumatic deformity of the distal aspect of the second and third metatarsals. 3.  Status post ORIF of the base of the fifth metatarsal.   Original Report Authenticated By: Etheleen Mayhew, M.D.     1115:  Pt states he has been seen Ortho Dr. Theda Sers previously.  T/C to Ortho Dr. Doran Durand (on call for Dr. Theda Sers), case discussed, including:  HPI, pertinent PM/SHx, VS/PE, dx testing, ED course and treatment:  requests place pt in short leg splint with side stirrup, NWB, f/u ofc this  week.  Dx testing d/w pt.  Questions answered.  Verb understanding, agreeable to d/c home with outpt f/u. Requesting another dose of pain meds, as well as a wheelchair for home.  PA to speak with Case Management regarding same.   Alfonzo Feller, DO 05/27/12 1227

## 2012-05-30 ENCOUNTER — Ambulatory Visit: Payer: Medicare Other | Admitting: Physical Therapy

## 2012-05-30 DIAGNOSIS — S92309A Fracture of unspecified metatarsal bone(s), unspecified foot, initial encounter for closed fracture: Secondary | ICD-10-CM | POA: Diagnosis not present

## 2012-05-30 NOTE — ED Provider Notes (Signed)
Medical screening examination/treatment/procedure(s) were conducted as a shared visit with non-physician practitioner(s) and myself.  I personally evaluated the patient during the encounter See my previous note.  Alfonzo Feller, DO 05/30/12 2102

## 2012-06-01 ENCOUNTER — Ambulatory Visit: Payer: Medicare Other | Admitting: Physical Therapy

## 2012-06-06 ENCOUNTER — Ambulatory Visit: Payer: Medicare Other | Admitting: Physical Therapy

## 2012-06-08 ENCOUNTER — Ambulatory Visit: Payer: Medicare Other | Admitting: Physical Therapy

## 2012-06-12 DIAGNOSIS — M5137 Other intervertebral disc degeneration, lumbosacral region: Secondary | ICD-10-CM | POA: Diagnosis not present

## 2012-06-12 DIAGNOSIS — G894 Chronic pain syndrome: Secondary | ICD-10-CM | POA: Diagnosis not present

## 2012-06-12 DIAGNOSIS — H5710 Ocular pain, unspecified eye: Secondary | ICD-10-CM | POA: Diagnosis not present

## 2012-06-16 DIAGNOSIS — S92309A Fracture of unspecified metatarsal bone(s), unspecified foot, initial encounter for closed fracture: Secondary | ICD-10-CM | POA: Diagnosis not present

## 2012-06-19 DIAGNOSIS — M542 Cervicalgia: Secondary | ICD-10-CM | POA: Diagnosis not present

## 2012-06-22 ENCOUNTER — Other Ambulatory Visit: Payer: Self-pay | Admitting: Neurological Surgery

## 2012-06-22 DIAGNOSIS — M542 Cervicalgia: Secondary | ICD-10-CM

## 2012-06-28 ENCOUNTER — Ambulatory Visit
Admission: RE | Admit: 2012-06-28 | Discharge: 2012-06-28 | Disposition: A | Discharge status: Home / Self Care | Payer: Medicare Other | Source: Ambulatory Visit | Attending: Neurological Surgery | Admitting: Neurological Surgery

## 2012-06-28 DIAGNOSIS — M4802 Spinal stenosis, cervical region: Secondary | ICD-10-CM | POA: Diagnosis not present

## 2012-06-28 DIAGNOSIS — M542 Cervicalgia: Secondary | ICD-10-CM

## 2012-06-29 ENCOUNTER — Other Ambulatory Visit: Payer: Medicare Other

## 2012-07-10 DIAGNOSIS — M25519 Pain in unspecified shoulder: Secondary | ICD-10-CM | POA: Diagnosis not present

## 2012-07-10 DIAGNOSIS — S92309A Fracture of unspecified metatarsal bone(s), unspecified foot, initial encounter for closed fracture: Secondary | ICD-10-CM | POA: Diagnosis not present

## 2012-07-25 DIAGNOSIS — M542 Cervicalgia: Secondary | ICD-10-CM | POA: Diagnosis not present

## 2012-07-31 DIAGNOSIS — M25519 Pain in unspecified shoulder: Secondary | ICD-10-CM | POA: Diagnosis not present

## 2012-07-31 DIAGNOSIS — E119 Type 2 diabetes mellitus without complications: Secondary | ICD-10-CM | POA: Diagnosis not present

## 2012-07-31 DIAGNOSIS — E871 Hypo-osmolality and hyponatremia: Secondary | ICD-10-CM | POA: Diagnosis not present

## 2012-08-02 DIAGNOSIS — M810 Age-related osteoporosis without current pathological fracture: Secondary | ICD-10-CM | POA: Diagnosis not present

## 2012-08-02 DIAGNOSIS — E119 Type 2 diabetes mellitus without complications: Secondary | ICD-10-CM | POA: Diagnosis not present

## 2012-08-02 DIAGNOSIS — E871 Hypo-osmolality and hyponatremia: Secondary | ICD-10-CM | POA: Diagnosis not present

## 2012-08-04 DIAGNOSIS — IMO0001 Reserved for inherently not codable concepts without codable children: Secondary | ICD-10-CM | POA: Diagnosis not present

## 2012-08-04 DIAGNOSIS — G894 Chronic pain syndrome: Secondary | ICD-10-CM | POA: Diagnosis not present

## 2012-08-04 DIAGNOSIS — H5710 Ocular pain, unspecified eye: Secondary | ICD-10-CM | POA: Diagnosis not present

## 2012-08-04 DIAGNOSIS — M5137 Other intervertebral disc degeneration, lumbosacral region: Secondary | ICD-10-CM | POA: Diagnosis not present

## 2012-08-07 DIAGNOSIS — S8290XD Unspecified fracture of unspecified lower leg, subsequent encounter for closed fracture with routine healing: Secondary | ICD-10-CM | POA: Diagnosis not present

## 2012-08-23 ENCOUNTER — Other Ambulatory Visit (HOSPITAL_COMMUNITY): Payer: Self-pay | Admitting: *Deleted

## 2012-08-24 ENCOUNTER — Encounter (HOSPITAL_COMMUNITY)
Admission: RE | Admit: 2012-08-24 | Discharge: 2012-08-24 | Disposition: A | Discharge status: Home / Self Care | Payer: Medicare Other | Source: Ambulatory Visit | Attending: Endocrinology | Admitting: Endocrinology

## 2012-08-24 DIAGNOSIS — M810 Age-related osteoporosis without current pathological fracture: Secondary | ICD-10-CM | POA: Diagnosis not present

## 2012-08-24 MED ORDER — ZOLEDRONIC ACID 5 MG/100ML IV SOLN
5.0000 mg | Freq: Once | INTRAVENOUS | Status: AC
  Administered 2012-08-24: 5 mg via INTRAVENOUS

## 2012-09-26 DIAGNOSIS — H5710 Ocular pain, unspecified eye: Secondary | ICD-10-CM | POA: Diagnosis not present

## 2012-09-26 DIAGNOSIS — G894 Chronic pain syndrome: Secondary | ICD-10-CM | POA: Diagnosis not present

## 2012-09-26 DIAGNOSIS — M5137 Other intervertebral disc degeneration, lumbosacral region: Secondary | ICD-10-CM | POA: Diagnosis not present

## 2012-10-02 DIAGNOSIS — M810 Age-related osteoporosis without current pathological fracture: Secondary | ICD-10-CM | POA: Diagnosis not present

## 2012-10-02 DIAGNOSIS — E871 Hypo-osmolality and hyponatremia: Secondary | ICD-10-CM | POA: Diagnosis not present

## 2012-10-03 DIAGNOSIS — R490 Dysphonia: Secondary | ICD-10-CM | POA: Diagnosis not present

## 2012-10-03 DIAGNOSIS — H5710 Ocular pain, unspecified eye: Secondary | ICD-10-CM | POA: Diagnosis not present

## 2012-10-04 DIAGNOSIS — M810 Age-related osteoporosis without current pathological fracture: Secondary | ICD-10-CM | POA: Diagnosis not present

## 2012-10-04 DIAGNOSIS — E871 Hypo-osmolality and hyponatremia: Secondary | ICD-10-CM | POA: Diagnosis not present

## 2012-10-04 DIAGNOSIS — E119 Type 2 diabetes mellitus without complications: Secondary | ICD-10-CM | POA: Diagnosis not present

## 2012-10-04 DIAGNOSIS — E559 Vitamin D deficiency, unspecified: Secondary | ICD-10-CM | POA: Diagnosis not present

## 2012-10-05 ENCOUNTER — Other Ambulatory Visit: Payer: Self-pay | Admitting: Otolaryngology

## 2012-10-06 ENCOUNTER — Other Ambulatory Visit: Payer: Medicare Other

## 2012-10-09 DIAGNOSIS — J209 Acute bronchitis, unspecified: Secondary | ICD-10-CM | POA: Diagnosis not present

## 2012-10-09 DIAGNOSIS — E1149 Type 2 diabetes mellitus with other diabetic neurological complication: Secondary | ICD-10-CM | POA: Diagnosis not present

## 2012-10-09 DIAGNOSIS — R062 Wheezing: Secondary | ICD-10-CM | POA: Diagnosis not present

## 2012-10-12 ENCOUNTER — Ambulatory Visit
Admission: RE | Admit: 2012-10-12 | Discharge: 2012-10-12 | Disposition: A | Discharge status: Home / Self Care | Payer: Medicare Other | Source: Ambulatory Visit | Attending: Otolaryngology | Admitting: Otolaryngology

## 2012-10-12 DIAGNOSIS — J3489 Other specified disorders of nose and nasal sinuses: Secondary | ICD-10-CM | POA: Diagnosis not present

## 2012-10-13 ENCOUNTER — Other Ambulatory Visit: Payer: Self-pay | Admitting: *Deleted

## 2012-10-23 ENCOUNTER — Ambulatory Visit (INDEPENDENT_AMBULATORY_CARE_PROVIDER_SITE_OTHER): Payer: Medicare Other | Admitting: Surgery

## 2012-10-23 ENCOUNTER — Encounter: Payer: Self-pay | Admitting: Surgery

## 2012-10-23 ENCOUNTER — Other Ambulatory Visit (INDEPENDENT_AMBULATORY_CARE_PROVIDER_SITE_OTHER): Payer: Medicare Other | Admitting: *Deleted

## 2012-10-23 DIAGNOSIS — I6529 Occlusion and stenosis of unspecified carotid artery: Secondary | ICD-10-CM | POA: Diagnosis not present

## 2012-10-23 DIAGNOSIS — Z48812 Encounter for surgical aftercare following surgery on the circulatory system: Secondary | ICD-10-CM

## 2012-10-23 NOTE — Progress Notes (Signed)
[facility]   Patient name: [name] MRN: [id number] DOB: 06/09/1946 Sex: male     Chief Complaint  Patient presents with  . Carotid    1 year f/u L CEA 03/2011    HISTORY OF PRESENT ILLNESS: The patient comes in today for followup. He is status post aortobifemoral bypass graft in February of 2012. He is also status post left carotid endarterectomy in August of 2012 for symptomatic stenosis. The patient comes in today without significant complaints other than more prominent veins on his lower leg and swelling. He denies symptoms of claudication. He denies neurologic symptoms.  Past Medical History  Diagnosis Date  . DJD (degenerative joint disease)     L wrist  . Lumbar disc disease   . HTN (hypertension)   . Anemia     NOS  . Osteoporosis     tx per [facility], Dr Prudencio Burly, thought due to heavy steriod use after 1978  . Spine fracture     hx, multiple  . Blindness of left eye     near blindness. 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 GERD (gastroesophageal reflux disease)   . DM2 (diabetes mellitus, type 2)   . Anxiety   . Depression   . BPH (benign prostatic hypertrophy)   . HLD (hyperlipidemia)   . Chronic pain syndrome   . Chronic hyponatremia   . Diabetes mellitus     Past Surgical History  Procedure Laterality Date  . Right hand fracture  1969  . Left ankle ganglion cyst  1976  . Lumbar disc surgery  1981, 2004  . Nasal sinus surgery      multiple- x8. last 1997 with obliteration  . Left cataract  1996    right - 1997  . Left foot surgery  1998    R surgery/fracture - 1999  . Left cts  2006    R CTS - 2007  . Left wrist/hand fusion  2008  . L foot open repair jones fracture  2010     5th metetarsal   . Rotator cuff repair  2006    R, than repeat 2011, Dr Theda Sers  . Vocal surgery  1996  . Repair thoracic aorta      History   Social History  . Marital Status: Divorced    Spouse Name: N/A    Number of Children: N/A  . Years of Education: N/A    Occupational History  . Not on file.   Social History Main Topics  . Smoking status: Former Research scientist (life sciences)  . Smokeless tobacco: Not on file  . Alcohol Use: No  . Drug Use: No  . Sexually Active: Not on file   Other Topics Concern  . Not on file   Social History Narrative   Divorced. 2 adult sons - 15 with DM.    Disabled after [place] accident with back injury 1978.     Family History  Problem Relation Age of Onset  . Lung cancer Father     and grandmother  . Throat cancer      family hx - "bad living" - also lung CA, heart disease and renal failure    . Diabetes Mother     DM, also sister and MGF     Allergies as of 10/23/2012 - Review Complete 10/23/2012  Allergen Reaction Noted  . Ivp dye (iodinated diagnostic agents)  09/21/2010  . Penicillins  03/13/2010  . Propoxyphene-acetaminophen Other (See Comments)   . Sulfonamide derivatives  Hives and Itching     Current Outpatient Prescriptions on File Prior to Visit  Medication Sig Dispense Refill  . buPROPion (WELLBUTRIN XL) 300 MG 24 hr tablet Take 300 mg by mouth every morning.       . carvedilol (COREG) 6.25 MG tablet Take 6.25 mg by mouth daily.       . celecoxib (CELEBREX) 100 MG capsule Take 200 mg by mouth 2 (two) times daily as needed. For pain      . Cholecalciferol (VITAMIN D) 1000 UNITS capsule Take 6,000 Units by mouth daily.       . clonazePAM (KLONOPIN) 1 MG tablet Take 1 mg by mouth 3 (three) times daily.       . fentaNYL (DURAGESIC - DOSED MCG/HR) 75 MCG/HR Place 1 patch onto the skin every 3 (three) days. For pain      . fludrocortisone (FLORINEF) 0.1 MG tablet Take 0.1 mg by mouth daily. 1/2 tablet every other day      . glimepiride (AMARYL) 1 MG tablet Take 1 mg by mouth daily as needed.       Marland Kitchen L-Methylfolate-B6-B12 (VITACIRC-B) 3-35-2 MG TABS Take 1 tablet by mouth 2 (two) times daily.       Marland Kitchen omeprazole (PRILOSEC) 40 MG capsule Take 40 mg by mouth 2 (two) times daily.       Marland Kitchen oxyCODONE-acetaminophen  (PERCOCET) 10-325 MG per tablet Take 1 tablet by mouth 4 (four) times daily as needed. For pain      . sertraline (ZOLOFT) 100 MG tablet Take 100 mg by mouth daily.       . sitaGLIPtin (JANUVIA) 50 MG tablet Take 100 mg by mouth daily.       . Tamsulosin HCl (FLOMAX) 0.4 MG CAPS Take 0.1 mg by mouth 2 (two) times daily.       Marland Kitchen zonisamide (ZONEGRAN) 50 MG capsule Take 150 mg by mouth at bedtime.        Marland Kitchen oxyCODONE-acetaminophen (PERCOCET/ROXICET) 5-325 MG per tablet Take 2 tablets by mouth every 4 (four) hours as needed for pain.  6 tablet  0  . SODIUM & POTASSIUM BICARBONATE PO Take 2 tablets by mouth 4 (four) times daily.       No current facility-administered medications on file prior to visit.     REVIEW OF SYSTEMS: Cardiovascular: Positive for shortness of breath with exertion, pain in his legs when walking and lying flat, swelling in his legs and varicose veins Pulmonary: Positive for wheezing. Neurologic: Positive for weakness in his arms and legs as well as numbness. Negative for dizziness Hematologic: No bleeding problems or clotting disorders. Musculoskeletal: No joint pain or joint swelling. Gastrointestinal: No blood in stool or hematemesis Genitourinary: No dysuria or hematuria. Psychiatric:: history of major depression. Integumentary: No rashes or ulcers. Constitutional: No fever or chills.  PHYSICAL EXAMINATION:   Vital signs are BP 121/58  Pulse 90  Ht 6\' 1"  (1.854 m)  Wt 224 lb (101.606 kg)  BMI 29.56 kg/m2  SpO2 97% General: The patient appears their stated age. HEENT:  No gross abnormalities Pulmonary:  Non labored breathing Abdomen: Soft and non-tender midline incision is well-healed. No hernia Musculoskeletal: There are no major deformities. Neurologic: No focal weakness or paresthesias are detected, Skin: There are no ulcer or rashes noted. Psychiatric: The patient has normal affect. Cardiovascular: There is a regular rate and rhythm without significant  murmur appreciated. Palpable pedal pulses. Prominent varicosities of both lower extremities with 1+ edema bilaterally. He  also has chronic venous stasis changes on both legs, left greater than right   Diagnostic Studies Ultrasound today has been reviewed. This is a widely patent left carotid endarterectomy site with less than 40% right carotid stenosis.  Assessment: #1 carotid stenosis #2: Peripheral vascular disease #3: Venous insufficiency  Plan: #1: The patient remains asymptomatic from a carotid perspective. He denies symptoms currently. His operative site is widely patent. He'll continue with routine surveillance. #2: The patient continues to have palpable pedal pulses following his aortobifemoral bypass graft. He has complaints of leg pain however this is most likely are related to his neuropathy. He'll continue with routine surveillance. #3: The patient has prominent varicosities on both legs as well as swelling and skin changes. He most likely is suffering from venous insufficiency however this has never been documented. I have given him a prescription for thigh high 20-30 mm compression stockings. I will see him back in 3 months. At that time I will obtain bilateral lower extremity venous insufficiency evaluation ultrasound  V. Leia Alf, M.D. [facility] [place] Office: [phone] Pager:  [phone]

## 2012-10-24 NOTE — Addendum Note (Signed)
Addended by: Mena Goes on: 10/24/2012 09:04 AM   Modules accepted: Orders

## 2012-11-27 DIAGNOSIS — M5137 Other intervertebral disc degeneration, lumbosacral region: Secondary | ICD-10-CM | POA: Diagnosis not present

## 2012-11-27 DIAGNOSIS — Z79899 Other long term (current) drug therapy: Secondary | ICD-10-CM | POA: Diagnosis not present

## 2012-11-27 DIAGNOSIS — R059 Cough, unspecified: Secondary | ICD-10-CM | POA: Diagnosis not present

## 2012-11-27 DIAGNOSIS — G894 Chronic pain syndrome: Secondary | ICD-10-CM | POA: Diagnosis not present

## 2012-11-27 DIAGNOSIS — H5710 Ocular pain, unspecified eye: Secondary | ICD-10-CM | POA: Diagnosis not present

## 2012-11-27 DIAGNOSIS — R05 Cough: Secondary | ICD-10-CM | POA: Diagnosis not present

## 2012-12-27 DIAGNOSIS — I872 Venous insufficiency (chronic) (peripheral): Secondary | ICD-10-CM | POA: Diagnosis not present

## 2012-12-27 DIAGNOSIS — R609 Edema, unspecified: Secondary | ICD-10-CM | POA: Diagnosis not present

## 2013-01-01 DIAGNOSIS — E119 Type 2 diabetes mellitus without complications: Secondary | ICD-10-CM | POA: Diagnosis not present

## 2013-01-01 DIAGNOSIS — E871 Hypo-osmolality and hyponatremia: Secondary | ICD-10-CM | POA: Diagnosis not present

## 2013-01-01 DIAGNOSIS — E559 Vitamin D deficiency, unspecified: Secondary | ICD-10-CM | POA: Diagnosis not present

## 2013-01-03 DIAGNOSIS — E871 Hypo-osmolality and hyponatremia: Secondary | ICD-10-CM | POA: Diagnosis not present

## 2013-01-03 DIAGNOSIS — E119 Type 2 diabetes mellitus without complications: Secondary | ICD-10-CM | POA: Diagnosis not present

## 2013-01-03 DIAGNOSIS — M810 Age-related osteoporosis without current pathological fracture: Secondary | ICD-10-CM | POA: Diagnosis not present

## 2013-01-03 DIAGNOSIS — E785 Hyperlipidemia, unspecified: Secondary | ICD-10-CM | POA: Diagnosis not present

## 2013-01-25 DIAGNOSIS — M5137 Other intervertebral disc degeneration, lumbosacral region: Secondary | ICD-10-CM | POA: Diagnosis not present

## 2013-01-25 DIAGNOSIS — H5710 Ocular pain, unspecified eye: Secondary | ICD-10-CM | POA: Diagnosis not present

## 2013-01-25 DIAGNOSIS — Z79899 Other long term (current) drug therapy: Secondary | ICD-10-CM | POA: Diagnosis not present

## 2013-01-25 DIAGNOSIS — G894 Chronic pain syndrome: Secondary | ICD-10-CM | POA: Diagnosis not present

## 2013-01-26 ENCOUNTER — Encounter: Payer: Self-pay | Admitting: Surgery

## 2013-01-29 ENCOUNTER — Encounter: Payer: Self-pay | Admitting: Surgery

## 2013-01-29 ENCOUNTER — Encounter (INDEPENDENT_AMBULATORY_CARE_PROVIDER_SITE_OTHER): Payer: Medicare Other | Admitting: *Deleted

## 2013-01-29 ENCOUNTER — Ambulatory Visit (INDEPENDENT_AMBULATORY_CARE_PROVIDER_SITE_OTHER): Payer: Medicare Other | Admitting: Surgery

## 2013-01-29 VITALS — BP 101/60 | HR 56 | Resp 16 | Ht 73.0 in | Wt 223.0 lb

## 2013-01-29 DIAGNOSIS — I83893 Varicose veins of bilateral lower extremities with other complications: Secondary | ICD-10-CM

## 2013-01-29 DIAGNOSIS — Z48812 Encounter for surgical aftercare following surgery on the circulatory system: Secondary | ICD-10-CM | POA: Diagnosis not present

## 2013-01-29 NOTE — Progress Notes (Signed)
[facility] and [facility] Specialist of [facility]   Patient name: [name] MRN: [id number] DOB: 25-Dec-1945 Sex: male     Chief Complaint  Patient presents with  . Varicose Veins    Right leg swelling , pt had troble putting on sock.    HISTORY OF PRESENT ILLNESS: The patient comes back today for followup. He is status post aortobifemoral bypass graft as well as left carotid endarterectomy in 2012. When I last saw him he was complaining of pain in both legs with swelling. He has prominent varicose veins. I treated him for venous insufficiency by placing them into 20-30 mm thigh-high compression stockings. Unfortunately because of his shoulder problems he had a very difficult time getting these on. He was able to get some looser compression on it did help with his swelling. He still complains of pain in his legs. This could potentially be related to degenerative back disease which he is undergoing evaluation for.  Past Medical History  Diagnosis Date  . DJD (degenerative joint disease)     L wrist  . Lumbar disc disease   . HTN (hypertension)   . Anemia     NOS  . Osteoporosis     tx per [facility], Dr Prudencio Burly, thought due to heavy steriod use after 1978  . Spine fracture     hx, multiple  . Blindness of left eye     near blindness. 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 GERD (gastroesophageal reflux disease)   . DM2 (diabetes mellitus, type 2)   . Anxiety   . Depression   . BPH (benign prostatic hypertrophy)   . HLD (hyperlipidemia)   . Chronic pain syndrome   . Chronic hyponatremia   . Diabetes mellitus   . Broken foot Oct. 12, 2013    Right foot Fx    Past Surgical History  Procedure Laterality Date  . Right hand fracture  1969  . Left ankle ganglion cyst  1976  . Lumbar disc surgery  1981, 2004  . Nasal sinus surgery      multiple- x8. last 1997 with obliteration  . Left cataract  1996    right - 1997  . Left foot surgery  1998    R surgery/fracture - 1999  . Left cts  2006    R CTS - 2007  .  Left wrist/hand fusion  2008  . L foot open repair jones fracture  2010     5th metetarsal   . Rotator cuff repair  2006    R, than repeat 2011, Dr Theda Sers  . Vocal surgery  1996  . Repair thoracic aorta      History   Social History  . Marital Status: Divorced    Spouse Name: N/A    Number of Children: N/A  . Years of Education: N/A   Occupational History  . Not on file.   Social History Main Topics  . Smoking status: Former Research scientist (life sciences)  . Smokeless tobacco: Never Used  . Alcohol Use: No  . Drug Use: No  . Sexually Active: Not on file   Other Topics Concern  . Not on file   Social History Narrative   Divorced. 2 adult sons - 56 with DM.    Disabled after [place] accident with back injury 1978.     Family History  Problem Relation Age of Onset  . Lung cancer Father     and grandmother  . Throat cancer      family hx - "bad  living" - also lung CA, heart disease and renal failure    . Diabetes Mother     DM, also sister and MGF     Allergies as of 01/29/2013 - Review Complete 01/29/2013  Allergen Reaction Noted  . Ivp dye (iodinated diagnostic agents)  09/21/2010  . Penicillins  03/13/2010  . Propoxyphene-acetaminophen Other (See Comments)   . Sulfonamide derivatives Hives and Itching     Current Outpatient Prescriptions on File Prior to Visit  Medication Sig Dispense Refill  . buPROPion (WELLBUTRIN XL) 300 MG 24 hr tablet Take 300 mg by mouth every morning.       . celecoxib (CELEBREX) 100 MG capsule Take 200 mg by mouth 2 (two) times daily as needed. For pain      . Cholecalciferol (VITAMIN D) 1000 UNITS capsule Take 6,000 Units by mouth daily.       . clonazePAM (KLONOPIN) 1 MG tablet Take 1 mg by mouth 3 (three) times daily.       . fentaNYL (DURAGESIC - DOSED MCG/HR) 75 MCG/HR Place 1 patch onto the skin every 3 (three) days. For pain      . L-Methylfolate-B6-B12 (VITACIRC-B) 3-35-2 MG TABS Take 1 tablet by mouth 2 (two) times daily.       Marland Kitchen omeprazole  (PRILOSEC) 40 MG capsule Take 40 mg by mouth 2 (two) times daily.       Marland Kitchen oxyCODONE-acetaminophen (PERCOCET) 10-325 MG per tablet Take 1 tablet by mouth 4 (four) times daily as needed. For pain      . sertraline (ZOLOFT) 100 MG tablet Take 100 mg by mouth daily.       . sitaGLIPtin (JANUVIA) 50 MG tablet Take 100 mg by mouth daily.       . Tamsulosin HCl (FLOMAX) 0.4 MG CAPS Take 0.1 mg by mouth 2 (two) times daily.       . zaleplon (SONATA) 10 MG capsule Take 10 mg by mouth daily as needed.      . zonisamide (ZONEGRAN) 50 MG capsule Take 150 mg by mouth at bedtime.        . carvedilol (COREG) 6.25 MG tablet Take 6.25 mg by mouth daily.       . fludrocortisone (FLORINEF) 0.1 MG tablet Take 0.1 mg by mouth daily. 1/2 tablet every other day      . glimepiride (AMARYL) 1 MG tablet Take 1 mg by mouth daily as needed.       . Omega-3 Fatty Acids (FISH OIL PO) Take 1 tablet by mouth daily.      Marland Kitchen oxyCODONE-acetaminophen (PERCOCET/ROXICET) 5-325 MG per tablet Take 2 tablets by mouth every 4 (four) hours as needed for pain.  6 tablet  0  . SODIUM & POTASSIUM BICARBONATE PO Take 2 tablets by mouth 4 (four) times daily.      Marland Kitchen spironolactone (ALDACTONE) 25 MG tablet Take 25 mg by mouth daily.       No current facility-administered medications on file prior to visit.     REVIEW OF SYSTEMS: No changes from prior visit  PHYSICAL EXAMINATION:   Vital signs are BP 101/60  Pulse 56  Resp 16  Ht 6\' 1"  (1.854 m)  Wt 223 lb (101.152 kg)  BMI 29.43 kg/m2  SpO2 96% General: The patient appears their stated age. HEENT:  No gross abnormalities Pulmonary:  Non labored breathing Abdomen: Soft and non-tender. Midline incision is well-healed without hernia Musculoskeletal: There are no major deformities. Neurologic: No focal weakness or paresthesias  are detected, Skin: There are no ulcer or rashes noted. Psychiatric: The patient has normal affect. Cardiovascular: There is a regular rate and rhythm without  significant murmur appreciated. Palpable pedal pulses. prominent varicosities bilaterally with bilateral swelling, left greater than right. Hyperpigmentation bilaterally around the ankle.   Diagnostic Studies Venous reflux evaluation was performed today. This shows reflux in the left greater saphenous vein. There is also reflux within the deep system.  Right deep system reflux. Short saphenous reflux.  Assessment: Venous insufficiency Plan: The patient continues to have problems with pain and swelling in both legs, left greater than right. He also has very prominent varicosities bilaterally. Duplex ultrasound today shows bilateral deep system reflux and reflux in the left greater saphenous and right short saphenous vein. I will have the patient evaluated for laser ablation and stab phlebectomy.  Eldridge Abrahams, M.D. [facility] [place] Office: [phone] Pager:  [phone]

## 2013-01-30 ENCOUNTER — Ambulatory Visit: Payer: Medicare Other | Admitting: Vascular Surgery

## 2013-01-30 NOTE — Addendum Note (Signed)
Addended by: Dorthula Rue L on: 01/30/2013 04:05 PM   Modules accepted: Orders

## 2013-01-31 ENCOUNTER — Other Ambulatory Visit: Payer: Self-pay | Admitting: *Deleted

## 2013-02-09 ENCOUNTER — Encounter: Payer: Self-pay | Admitting: Vascular Surgery

## 2013-02-12 ENCOUNTER — Encounter: Payer: Self-pay | Admitting: Vascular Surgery

## 2013-02-12 ENCOUNTER — Ambulatory Visit (INDEPENDENT_AMBULATORY_CARE_PROVIDER_SITE_OTHER): Payer: Medicare Other | Admitting: Vascular Surgery

## 2013-02-12 VITALS — BP 144/72 | HR 66 | Resp 18 | Ht 73.0 in | Wt 225.1 lb

## 2013-02-12 DIAGNOSIS — I83893 Varicose veins of bilateral lower extremities with other complications: Secondary | ICD-10-CM

## 2013-02-12 NOTE — Progress Notes (Signed)
Subjective:     Patient ID: [name], male   DOB: Feb 02, 1946, 67 y.o.   MRN: [id number]  HPI this 67 year old male has been followed by Dr. Trula Slade for severe venous insufficiency of both lower extremities with painful varicosities. He has tried elastic compression stockings 20-30 mm gradient with no success and also tried elevation and ibuprofen. He continues to have severe symptoms in both legs. He has had varicose veins for many years and now develops increasing swelling and discomfort as the day progresses. It is affecting his daily living.  Past Medical History  Diagnosis Date  . DJD (degenerative joint disease)     L wrist  . Lumbar disc disease   . HTN (hypertension)   . Anemia     NOS  . Osteoporosis     tx per [facility], Dr Prudencio Burly, thought due to heavy steriod use after 1978  . Spine fracture     hx, multiple  . Blindness of left eye     near blindness. 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 GERD (gastroesophageal reflux disease)   . DM2 (diabetes mellitus, type 2)   . Anxiety   . Depression   . BPH (benign prostatic hypertrophy)   . HLD (hyperlipidemia)   . Chronic pain syndrome   . Chronic hyponatremia   . Diabetes mellitus   . Broken foot Oct. 12, 2013    Right foot Fx  . Varicose veins     History  Substance Use Topics  . Smoking status: Former Research scientist (life sciences)  . Smokeless tobacco: Never Used  . Alcohol Use: No    Family History  Problem Relation Age of Onset  . Lung cancer Father     and grandmother  . Throat cancer      family hx - "bad living" - also lung CA, heart disease and renal failure    . Diabetes Mother     DM, also sister and MGF     Allergies  Allergen Reactions  . Ivp Dye (Iodinated Diagnostic Agents)     Blood Pressure dropped----pt was pre-medicated with 13 hour prep and did fine with pre-meds--amy 03/09/11   . Penicillins     REACTION: gi upset  . Propoxyphene-Acetaminophen Other (See Comments)    Sharp pains- headache  . Sulfonamide Derivatives Hives and Itching     Current outpatient prescriptions:buPROPion (WELLBUTRIN XL) 300 MG 24 hr tablet, Take 300 mg by mouth every morning. , Disp: , Rfl: ;  celecoxib (CELEBREX) 100 MG capsule, Take 200 mg by mouth 2 (two) times daily as needed. For pain, Disp: , Rfl: ;  Cholecalciferol (VITAMIN D) 1000 UNITS capsule, Take 6,000 Units by mouth daily. , Disp: , Rfl: ;  clonazePAM (KLONOPIN) 1 MG tablet, Take 1 mg by mouth 3 (three) times daily. , Disp: , Rfl:  fentaNYL (DURAGESIC - DOSED MCG/HR) 75 MCG/HR, Place 1 patch onto the skin every 3 (three) days. For pain, Disp: , Rfl: ;  L-Methylfolate-B6-B12 (VITACIRC-B) 3-35-2 MG TABS, Take 1 tablet by mouth 2 (two) times daily. , Disp: , Rfl: ;  MICROLET LANCETS MISC, , Disp: , Rfl: ;  omeprazole (PRILOSEC) 40 MG capsule, Take 40 mg by mouth 2 (two) times daily. , Disp: , Rfl: ;  ONE TOUCH ULTRA TEST test strip, , Disp: , Rfl:  oxyCODONE (ROXICODONE) 15 MG immediate release tablet, continuous as needed., Disp: , Rfl: ;  sertraline (ZOLOFT) 100 MG tablet, Take 100 mg by mouth daily. , Disp: , Rfl: ;  sitaGLIPtin (JANUVIA)  50 MG tablet, Take 100 mg by mouth daily. , Disp: , Rfl: ;  SODIUM & POTASSIUM BICARBONATE PO, Take 2 tablets by mouth 4 (four) times daily., Disp: , Rfl: ;  Tamsulosin HCl (FLOMAX) 0.4 MG CAPS, Take 0.1 mg by mouth 2 (two) times daily. , Disp: , Rfl:  zaleplon (SONATA) 10 MG capsule, Take 10 mg by mouth daily as needed., Disp: , Rfl: ;  zonisamide (ZONEGRAN) 50 MG capsule, Take 150 mg by mouth at bedtime.  , Disp: , Rfl: ;  carvedilol (COREG) 6.25 MG tablet, Take 6.25 mg by mouth daily. , Disp: , Rfl: ;  fludrocortisone (FLORINEF) 0.1 MG tablet, Take 0.1 mg by mouth daily. 1/2 tablet every other day, Disp: , Rfl:  glimepiride (AMARYL) 1 MG tablet, Take 1 mg by mouth daily as needed. , Disp: , Rfl: ;  Omega-3 Fatty Acids (FISH OIL PO), Take 1 tablet by mouth daily., Disp: , Rfl: ;  oxyCODONE-acetaminophen (PERCOCET) 10-325 MG per tablet, Take 1 tablet by mouth 4  (four) times daily as needed. For pain, Disp: , Rfl:  oxyCODONE-acetaminophen (PERCOCET/ROXICET) 5-325 MG per tablet, Take 2 tablets by mouth every 4 (four) hours as needed for pain., Disp: 6 tablet, Rfl: 0;  spironolactone (ALDACTONE) 25 MG tablet, Take 25 mg by mouth daily., Disp: , Rfl:   BP 144/72  Pulse 66  Resp 18  Ht 6\' 1"  (1.854 m)  Wt 225 lb 1.6 oz (102.105 kg)  BMI 29.7 kg/m2  Body mass index is 29.7 kg/(m^2).           Review of Systems denies chest pain, dyspnea on exertion, PND, orthopnea, hemoptysis, claudication. Does have severe bilateral shoulder pain     Objective:   Physical Exam blood pressure 144/72 heart rate 66 respirations 18 Gen.-alert and oriented x3 in no apparent distress HEENT normal for age Lungs no rhonchi or wheezing Cardiovascular regular rhythm no murmurs carotid pulses 3+ palpable no bruits audible Abdomen soft nontender no palpable masses Musculoskeletal free of  major deformities Skin clear -no rashes he has diffuse varicose veins in the left anterior thigh and medial calf with an extensive network of reticular and spider veins are around the left medial malleolar area with 1+ chronic edema and early hyperpigmentation. Right leg has bulging varicosities in the posterior calf and medial calf with 1+ edema Lower extremities 3+ femoral and dorsalis pedis pulses palpable bilaterally with 1+ edema bilaterally  This patient has documented reflux in the left great saphenous vein in the right small saphenous vein as well as some deep venous reflux      Assessment:     Severe bilateral venous insufficiency with chronic pain and swelling and skin changes he has failed conservative management and needs #1 laser ablation left great saphenous vein followed by a #2 laser ablation right small saphenous vein to hopefully stabilize it skin changes and relieve his symptoms to some degree     Plan:     We'll proceed with precertification to perform  this in the near future beginning with left great saphenous vein initially followed by right small saphenous

## 2013-02-13 ENCOUNTER — Ambulatory Visit: Payer: Medicare Other | Admitting: Vascular Surgery

## 2013-02-13 ENCOUNTER — Other Ambulatory Visit: Payer: Self-pay

## 2013-02-13 DIAGNOSIS — I83893 Varicose veins of bilateral lower extremities with other complications: Secondary | ICD-10-CM

## 2013-03-12 DIAGNOSIS — E119 Type 2 diabetes mellitus without complications: Secondary | ICD-10-CM | POA: Diagnosis not present

## 2013-03-12 DIAGNOSIS — H3410 Central retinal artery occlusion, unspecified eye: Secondary | ICD-10-CM | POA: Diagnosis not present

## 2013-03-12 DIAGNOSIS — Z961 Presence of intraocular lens: Secondary | ICD-10-CM | POA: Diagnosis not present

## 2013-03-23 ENCOUNTER — Encounter: Payer: Self-pay | Admitting: Vascular Surgery

## 2013-03-26 ENCOUNTER — Ambulatory Visit (INDEPENDENT_AMBULATORY_CARE_PROVIDER_SITE_OTHER): Payer: Medicare Other | Admitting: Vascular Surgery

## 2013-03-26 ENCOUNTER — Encounter: Payer: Self-pay | Admitting: Vascular Surgery

## 2013-03-26 VITALS — BP 170/76 | HR 71 | Resp 18 | Ht 73.0 in | Wt 225.0 lb

## 2013-03-26 DIAGNOSIS — H5710 Ocular pain, unspecified eye: Secondary | ICD-10-CM | POA: Diagnosis not present

## 2013-03-26 DIAGNOSIS — G894 Chronic pain syndrome: Secondary | ICD-10-CM | POA: Diagnosis not present

## 2013-03-26 DIAGNOSIS — I83893 Varicose veins of bilateral lower extremities with other complications: Secondary | ICD-10-CM | POA: Diagnosis not present

## 2013-03-26 DIAGNOSIS — M5137 Other intervertebral disc degeneration, lumbosacral region: Secondary | ICD-10-CM | POA: Diagnosis not present

## 2013-03-26 DIAGNOSIS — Z79899 Other long term (current) drug therapy: Secondary | ICD-10-CM | POA: Diagnosis not present

## 2013-03-26 DIAGNOSIS — M25519 Pain in unspecified shoulder: Secondary | ICD-10-CM | POA: Diagnosis not present

## 2013-03-26 NOTE — Progress Notes (Signed)
Subjective:     Patient ID: [name], male   DOB: [birth year], 67 y.o.   MRN: [id number]  HPI this 67 year old male laser ablation of the left great saphenous vein performed under local tumescent anesthesia. A total of 1800 J of energy was utilized. He tolerated the procedure well  Review of Systems     Objective:   Physical Exam BP 170/76  Pulse 71  Resp 18  Ht 6\' 1"  (1.854 m)  Wt 225 lb (102.059 kg)  BMI 29.69 kg/m2        Assessment:     Well-tolerated laser ablation left great saphenous vein for venous hypertension with painful varicosities performed under local tumescent anesthesia    Plan:     Return in one week for venous duplex exam to confirm closure left great saphenous vein

## 2013-03-26 NOTE — Progress Notes (Signed)
Laser Ablation Procedure      Date: 03/26/2013    [name] DOB:1945-08-17  Consent signed: Yes  Surgeon:J.D. Kellie Simmering  Procedure: Laser Ablation: left Greater Saphenous Vein  BP 170/76  Pulse 71  Resp 18  Ht 6\' 1"  (1.854 m)  Wt 225 lb (102.059 kg)  BMI 29.69 kg/m2  Start time: 3:00   End time: 3:50  Tumescent Anesthesia: 230 cc 0.9% NaCl with 50 cc Lidocaine HCL with 1% Epi and 15 cc 8.4% NaHCO3  Local Anesthesia: 6 cc Lidocaine HCL and NaHCO3 (ratio 2:1)  Pulsed mode, 15 watt, 500 ms delay, 1.o duration Total energy: 1.870, total pulses: 125, total time: 2:04      Patient tolerated procedure well: Yes  Notes:   Description of Procedure:  After marking the course of the saphenous vein and the secondary varicosities in the standing position, the patient was placed on the operating table in the supine position, and the left leg was prepped and draped in sterile fashion. Local anesthetic was administered, and under ultrasound guidance the saphenous vein was accessed with a micro needle and guide wire; then the micro puncture sheath was placed. A guide wire was inserted to the saphenofemoral junction, followed by a 5 french sheath.  The position of the sheath and then the laser fiber below the junction was confirmed using the ultrasound and visualization of the aiming beam.  Tumescent anesthesia was administered along the course of the saphenous vein using ultrasound guidance. Protective laser glasses were placed on the patient, and the laser was fired at 15 watts  For a total of 1.870 joules.  A steri strip was applied to the puncture site.    ABD pads and thigh high compression stockings were applied.  Ace wrap bandages were applied over the phlebectomy sites and at the top of the saphenofemoral junction.  Blood loss was less than 15 cc.  The patient ambulated out of the operating room having tolerated the procedure well.

## 2013-03-27 ENCOUNTER — Other Ambulatory Visit: Payer: Self-pay | Admitting: *Deleted

## 2013-03-27 ENCOUNTER — Telehealth: Payer: Self-pay | Admitting: *Deleted

## 2013-03-27 DIAGNOSIS — E559 Vitamin D deficiency, unspecified: Secondary | ICD-10-CM

## 2013-03-27 DIAGNOSIS — E785 Hyperlipidemia, unspecified: Secondary | ICD-10-CM

## 2013-03-27 DIAGNOSIS — E119 Type 2 diabetes mellitus without complications: Secondary | ICD-10-CM

## 2013-03-27 NOTE — Telephone Encounter (Signed)
Pt doing well. No problems. Went over instructions and reminded him of his fu appt next week.

## 2013-04-02 ENCOUNTER — Encounter: Payer: Self-pay | Admitting: Vascular Surgery

## 2013-04-03 ENCOUNTER — Encounter: Payer: Self-pay | Admitting: Vascular Surgery

## 2013-04-03 ENCOUNTER — Other Ambulatory Visit (INDEPENDENT_AMBULATORY_CARE_PROVIDER_SITE_OTHER): Payer: Medicare Other

## 2013-04-03 ENCOUNTER — Encounter (INDEPENDENT_AMBULATORY_CARE_PROVIDER_SITE_OTHER): Payer: Medicare Other | Admitting: *Deleted

## 2013-04-03 ENCOUNTER — Ambulatory Visit (INDEPENDENT_AMBULATORY_CARE_PROVIDER_SITE_OTHER): Payer: Medicare Other | Admitting: Vascular Surgery

## 2013-04-03 VITALS — BP 110/63 | HR 63 | Resp 16 | Ht 73.0 in | Wt 220.0 lb

## 2013-04-03 DIAGNOSIS — I83893 Varicose veins of bilateral lower extremities with other complications: Secondary | ICD-10-CM | POA: Diagnosis not present

## 2013-04-03 DIAGNOSIS — E119 Type 2 diabetes mellitus without complications: Secondary | ICD-10-CM

## 2013-04-03 DIAGNOSIS — E785 Hyperlipidemia, unspecified: Secondary | ICD-10-CM | POA: Diagnosis not present

## 2013-04-03 DIAGNOSIS — Z48812 Encounter for surgical aftercare following surgery on the circulatory system: Secondary | ICD-10-CM | POA: Diagnosis not present

## 2013-04-03 LAB — URINALYSIS
Hgb urine dipstick: NEGATIVE
Nitrite: NEGATIVE
Specific Gravity, Urine: 1.01 (ref 1.000–1.030)
Total Protein, Urine: NEGATIVE
Urine Glucose: NEGATIVE
pH: 7 (ref 5.0–8.0)

## 2013-04-03 LAB — LIPID PANEL
Cholesterol: 147 mg/dL (ref 0–200)
LDL Cholesterol: 89 mg/dL (ref 0–99)
Triglycerides: 103 mg/dL (ref 0.0–149.0)

## 2013-04-03 LAB — COMPREHENSIVE METABOLIC PANEL
AST: 34 U/L (ref 0–37)
Albumin: 3.8 g/dL (ref 3.5–5.2)
Alkaline Phosphatase: 58 U/L (ref 39–117)
BUN: 13 mg/dL (ref 6–23)
Creatinine, Ser: 0.8 mg/dL (ref 0.4–1.5)
Glucose, Bld: 98 mg/dL (ref 70–99)
Potassium: 4.7 mEq/L (ref 3.5–5.1)
Total Bilirubin: 0.4 mg/dL (ref 0.3–1.2)

## 2013-04-03 LAB — MICROALBUMIN / CREATININE URINE RATIO
Creatinine,U: 43 mg/dL
Microalb Creat Ratio: 0.5 mg/g (ref 0.0–30.0)
Microalb, Ur: 0.2 mg/dL (ref 0.0–1.9)

## 2013-04-03 NOTE — Progress Notes (Signed)
Subjective:     Patient ID: [name], male   DOB: November 20, 1945, 67 y.o.   MRN: [id number]  HPI this 67 year old male returns 1 week post laser ablation left great saphenous vein for painful varicosities. He has had moderate discomfort along the course of the great saphenous vein from the distal thigh to the saphenofemoral junction and was able to palpate the saphenous vein beneath the skin. He was unable to take ibuprofen. Elastic compression stocking was also irritating him because of the tightness.  Past Medical History  Diagnosis Date  . DJD (degenerative joint disease)     L wrist  . Lumbar disc disease   . HTN (hypertension)   . Anemia     NOS  . Osteoporosis     tx per [facility], Dr Prudencio Burly, thought due to heavy steriod use after 1978  . Spine fracture     hx, multiple  . Blindness of left eye     near blindness. 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 GERD (gastroesophageal reflux disease)   . DM2 (diabetes mellitus, type 2)   . Anxiety   . Depression   . BPH (benign prostatic hypertrophy)   . HLD (hyperlipidemia)   . Chronic pain syndrome   . Chronic hyponatremia   . Diabetes mellitus   . Broken foot Oct. 12, 2013    Right foot Fx  . Varicose veins     History  Substance Use Topics  . Smoking status: Former Research scientist (life sciences)  . Smokeless tobacco: Never Used  . Alcohol Use: No    Family History  Problem Relation Age of Onset  . Lung cancer Father     and grandmother  . Throat cancer      family hx - "bad living" - also lung CA, heart disease and renal failure    . Diabetes Mother     DM, also sister and MGF     Allergies  Allergen Reactions  . Ivp Dye [Iodinated Diagnostic Agents]     Blood Pressure dropped----pt was pre-medicated with 13 hour prep and did fine with pre-meds--amy 03/09/11   . Penicillins     REACTION: gi upset  . Propoxyphene-Acetaminophen Other (See Comments)    Sharp pains- headache  . Sulfonamide Derivatives Hives and Itching    Current outpatient  prescriptions:buPROPion (WELLBUTRIN XL) 300 MG 24 hr tablet, Take 300 mg by mouth every morning. , Disp: , Rfl: ;  carvedilol (COREG) 6.25 MG tablet, Take 6.25 mg by mouth daily. , Disp: , Rfl: ;  celecoxib (CELEBREX) 100 MG capsule, Take 200 mg by mouth 2 (two) times daily as needed. For pain, Disp: , Rfl: ;  Cholecalciferol (VITAMIN D) 1000 UNITS capsule, Take 6,000 Units by mouth daily. , Disp: , Rfl:  clonazePAM (KLONOPIN) 1 MG tablet, Take 1 mg by mouth 3 (three) times daily. , Disp: , Rfl: ;  fentaNYL (DURAGESIC - DOSED MCG/HR) 75 MCG/HR, Place 1 patch onto the skin every 3 (three) days. For pain, Disp: , Rfl: ;  fludrocortisone (FLORINEF) 0.1 MG tablet, Take 0.1 mg by mouth daily. 1/2 tablet every other day, Disp: , Rfl: ;  glimepiride (AMARYL) 1 MG tablet, Take 1 mg by mouth daily as needed. , Disp: , Rfl:  L-Methylfolate-B6-B12 (VITACIRC-B) 3-35-2 MG TABS, Take 1 tablet by mouth 2 (two) times daily. , Disp: , Rfl: ;  MICROLET LANCETS MISC, , Disp: , Rfl: ;  Omega-3 Fatty Acids (FISH OIL PO), Take 1 tablet by mouth daily., Disp: , Rfl: ;  omeprazole (PRILOSEC) 40 MG capsule, Take 40 mg by mouth 2 (two) times daily. , Disp: , Rfl: ;  ONE TOUCH ULTRA TEST test strip, , Disp: , Rfl:  oxyCODONE (ROXICODONE) 15 MG immediate release tablet, continuous as needed., Disp: , Rfl: ;  oxyCODONE-acetaminophen (PERCOCET) 10-325 MG per tablet, Take 1 tablet by mouth 4 (four) times daily as needed. For pain, Disp: , Rfl: ;  oxyCODONE-acetaminophen (PERCOCET/ROXICET) 5-325 MG per tablet, Take 2 tablets by mouth every 4 (four) hours as needed for pain., Disp: 6 tablet, Rfl: 0 sertraline (ZOLOFT) 100 MG tablet, Take 100 mg by mouth daily. , Disp: , Rfl: ;  sitaGLIPtin (JANUVIA) 50 MG tablet, Take 100 mg by mouth daily. , Disp: , Rfl: ;  SODIUM & POTASSIUM BICARBONATE PO, Take 2 tablets by mouth 4 (four) times daily., Disp: , Rfl: ;  spironolactone (ALDACTONE) 25 MG tablet, Take 25 mg by mouth daily., Disp: , Rfl: ;   Tamsulosin HCl (FLOMAX) 0.4 MG CAPS, Take 0.1 mg by mouth 2 (two) times daily. , Disp: , Rfl:  zaleplon (SONATA) 10 MG capsule, Take 10 mg by mouth daily as needed., Disp: , Rfl: ;  zonisamide (ZONEGRAN) 50 MG capsule, Take 150 mg by mouth at bedtime.  , Disp: , Rfl:   BP 110/63  Pulse 63  Resp 16  Ht 6\' 1"  (1.854 m)  Wt 220 lb (99.791 kg)  BMI 29.03 kg/m2  Body mass index is 29.03 kg/(m^2).          Review of Systems denies chest pain, dyspnea on exertion, PND, orthopnea, hemoptysis    Objective:   Physical Exam BP 110/63  Pulse 63  Resp 16  Ht 6\' 1"  (1.854 m)  Wt 220 lb (99.791 kg)  BMI 29.03 kg/m2  General well-developed well-nourished male no apparent stress alert and oriented x3 Left lower extremity with palpable cord along the course of great saphenous vein from knee to saphenofemoral junction with minimal erythema. No distal edema noted. Varicosities and calf are less prominent than previously. 3+ dorsalis pedis pulse palpable.  Today I ordered a venous duplex exam the left leg which are reviewed and interpreted. There is no DVT. Left great saphenous vein is totally closed from the distal thigh to near the saphenofemoral junction.     Assessment:     Successful laser ablation left great saphenous vein performed under local tumescent anesthesia one week ago for venous hypertension and bulging varicosities    Plan:     Plan right small saphenous vein laser ablation on September 8 2 complete his treatment regimen

## 2013-04-04 ENCOUNTER — Other Ambulatory Visit: Payer: Self-pay | Admitting: *Deleted

## 2013-04-04 DIAGNOSIS — I83893 Varicose veins of bilateral lower extremities with other complications: Secondary | ICD-10-CM

## 2013-04-05 ENCOUNTER — Ambulatory Visit (INDEPENDENT_AMBULATORY_CARE_PROVIDER_SITE_OTHER): Payer: Medicare Other | Admitting: Endocrinology

## 2013-04-05 ENCOUNTER — Other Ambulatory Visit: Payer: Self-pay | Admitting: Endocrinology

## 2013-04-05 ENCOUNTER — Encounter: Payer: Self-pay | Admitting: Endocrinology

## 2013-04-05 ENCOUNTER — Other Ambulatory Visit: Payer: Self-pay | Admitting: *Deleted

## 2013-04-05 VITALS — BP 124/58 | HR 67 | Temp 98.7°F | Resp 12 | Ht 73.0 in | Wt 227.1 lb

## 2013-04-05 DIAGNOSIS — E871 Hypo-osmolality and hyponatremia: Secondary | ICD-10-CM

## 2013-04-05 DIAGNOSIS — E119 Type 2 diabetes mellitus without complications: Secondary | ICD-10-CM

## 2013-04-05 DIAGNOSIS — I739 Peripheral vascular disease, unspecified: Secondary | ICD-10-CM

## 2013-04-05 DIAGNOSIS — M810 Age-related osteoporosis without current pathological fracture: Secondary | ICD-10-CM

## 2013-04-05 MED ORDER — GLUCOSE BLOOD VI DISK: DISK | Status: DC

## 2013-04-05 MED ORDER — BAYER BREEZE 2 SYSTEM W/DEVICE KIT: PACK | Status: DC

## 2013-04-05 MED ORDER — ENALAPRIL MALEATE 5 MG PO TABS: 5.0000 mg | ORAL_TABLET | Freq: Every day | ORAL | Status: DC

## 2013-04-05 NOTE — Progress Notes (Signed)
Patient ID: Kaci Lassley, male   DOB: December 10, 1945, 67 y.o.   MRN: [id number]  Jerramy Furtado is an 67 y.o. male.   Reason for Appointment: Diabetes follow-up   History of Present Illness   Diagnosis: Type 2 DIABETES MELITUS  He has had relatively mild diabetes which is usually well controlled but Januvia alone.    Previously was taking Amaryl with her without the Januvia and this with gauze hypoglycemia Currently not taking Amaryl also previously has taken this as needed for high readings Difficult to assess his home control as he is checking infrequently and mostly fastings  Monitors blood glucose: Once a day.    Glucometer: Breeze      Blood Glucose readings from meter download: readings before breakfast: Usually about 100-110  Hypoglycemia frequency: Never.          Meals: 3 meals per day.          Physical activity: exercise: none, limited activity because of recent left leg vein surgery  HYPONATREMIA: This is chronic and probably not related to SIADH. Old records are not available but he had benefited significantly from empirically adding fludrocortisone. This was discontinued at some point because of edema, however was having other problems at this time Currently sodium is relatively low at 126 although he is still not complaining of nausea, anorexia or somnolence. He had tried to increase his salt tablet intake but is noncompliant with this. Also even though he was recommended Florinef last time 3 days a week he has not taken it   Appointment on 04/03/2013  Component Date Value Range Status  . Hemoglobin A1C 04/03/2013 6.7* 4.6 - 6.5 % Final   Glycemic Control Guidelines for People with Diabetes:Non Diabetic:  <6%Goal of Therapy: <7%Additional Action Suggested:  >8%   . Sodium 04/03/2013 126* 135 - 145 mEq/L Final  . Potassium 04/03/2013 4.7  3.5 - 5.1 mEq/L Final  . Chloride 04/03/2013 94* 96 - 112 mEq/L Final  . CO2 04/03/2013 27  19 - 32 mEq/L Final  . Glucose, Bld 04/03/2013 98   70 - 99 mg/dL Final  . BUN 04/03/2013 13  6 - 23 mg/dL Final  . Creatinine, Ser 04/03/2013 0.8  0.4 - 1.5 mg/dL Final  . Total Bilirubin 04/03/2013 0.4  0.3 - 1.2 mg/dL Final  . Alkaline Phosphatase 04/03/2013 58  39 - 117 U/L Final  . AST 04/03/2013 34  0 - 37 U/L Final  . ALT 04/03/2013 32  0 - 53 U/L Final  . Total Protein 04/03/2013 7.2  6.0 - 8.3 g/dL Final  . Albumin 04/03/2013 3.8  3.5 - 5.2 g/dL Final  . Calcium 04/03/2013 8.6  8.4 - 10.5 mg/dL Final  . GFR 04/03/2013 103.99  >60.00 mL/min Final  . Color, Urine 04/03/2013 LT. YELLOW  Yellow;Lt. Yellow Final  . APPearance 04/03/2013 CLEAR  Clear Final  . Specific Gravity, Urine 04/03/2013 1.010  1.000-1.030 Final  . pH 04/03/2013 7.0  5.0 - 8.0 Final  . Total Protein, Urine 04/03/2013 NEGATIVE  Negative Final  . Urine Glucose 04/03/2013 NEGATIVE  Negative Final  . Ketones, ur 04/03/2013 NEGATIVE  Negative Final  . Bilirubin Urine 04/03/2013 NEGATIVE  Negative Final  . Hgb urine dipstick 04/03/2013 NEGATIVE  Negative Final  . Urobilinogen, UA 04/03/2013 0.2  0.0 - 1.0 Final  . Leukocytes, UA 04/03/2013 NEGATIVE  Negative Final  . Nitrite 04/03/2013 NEGATIVE  Negative Final  . Microalb, Ur 04/03/2013 0.2  0.0 - 1.9 mg/dL Final  .  Creatinine,U 04/03/2013 43.0   Final  . Microalb Creat Ratio 04/03/2013 0.5  0.0 - 30.0 mg/g Final  . Cholesterol 04/03/2013 147  0 - 200 mg/dL Final   ATP III Classification       Desirable:  < 200 mg/dL               Borderline High:  200 - 239 mg/dL          High:  > = 240 mg/dL  . Triglycerides 04/03/2013 103.0  0.0 - 149.0 mg/dL Final   Normal:  <150 mg/dLBorderline High:  150 - 199 mg/dL  . HDL 04/03/2013 37.30* >39.00 mg/dL Final  . VLDL 04/03/2013 20.6  0.0 - 40.0 mg/dL Final  . LDL Cholesterol 04/03/2013 89  0 - 99 mg/dL Final  . Total CHOL/HDL Ratio 04/03/2013 4   Final                  Men          Women1/2 Average Risk     3.4          3.3Average Risk          5.0          4.42X Average  Risk          9.6          7.13X Average Risk          15.0          11.0                          Medication List       This list is accurate as of: 04/05/13  2:54 PM.  Always use your most recent med list.               buPROPion 300 MG 24 hr tablet  Commonly known as:  WELLBUTRIN XL  Take 300 mg by mouth every morning.     carvedilol 6.25 MG tablet  Commonly known as:  COREG  Take 6.25 mg by mouth daily.     celecoxib 100 MG capsule  Commonly known as:  CELEBREX  Take 200 mg by mouth 2 (two) times daily as needed. For pain     clonazePAM 1 MG tablet  Commonly known as:  KLONOPIN  Take 1 mg by mouth 3 (three) times daily.     fentaNYL 75 MCG/HR  Commonly known as:  DURAGESIC - dosed mcg/hr  Place 1 patch onto the skin every 3 (three) days. For pain     FISH OIL PO  Take 1 tablet by mouth daily.     FLOMAX 0.4 MG Caps capsule  Generic drug:  tamsulosin  Take 0.1 mg by mouth 2 (two) times daily.     fludrocortisone 0.1 MG tablet  Commonly known as:  FLORINEF  Take 0.1 mg by mouth daily. 1/2 tablet every other day     glimepiride 1 MG tablet  Commonly known as:  AMARYL  Take 1 mg by mouth daily as needed.     MICROLET LANCETS Misc     omeprazole 40 MG capsule  Commonly known as:  PRILOSEC  Take 40 mg by mouth 2 (two) times daily.     ONE TOUCH ULTRA TEST test strip  Generic drug:  glucose blood     oral electrolytes Tabs tablet  Take 6 tablets by mouth.     oxyCODONE 15 MG  immediate release tablet  Commonly known as:  ROXICODONE  continuous as needed.     oxyCODONE-acetaminophen 10-325 MG per tablet  Commonly known as:  PERCOCET  Take 1 tablet by mouth 4 (four) times daily as needed. For pain     oxyCODONE-acetaminophen 5-325 MG per tablet  Commonly known as:  PERCOCET/ROXICET  Take 2 tablets by mouth every 4 (four) hours as needed for pain.     sertraline 100 MG tablet  Commonly known as:  ZOLOFT  Take 100 mg by mouth daily.     SODIUM &  POTASSIUM BICARBONATE PO  Take 2 tablets by mouth 4 (four) times daily.     spironolactone 25 MG tablet  Commonly known as:  ALDACTONE  Take 25 mg by mouth daily.     VITACIRC-B 3-35-2 MG Tabs  Take 1 tablet by mouth 2 (two) times daily.     Vitamin D 1000 UNITS capsule  Take 6,000 Units by mouth daily.     zaleplon 10 MG capsule  Commonly known as:  SONATA  Take 10 mg by mouth daily as needed.     zonisamide 50 MG capsule  Commonly known as:  ZONEGRAN  Take 150 mg by mouth at bedtime.        Allergies:  Allergies  Allergen Reactions  . Ivp Dye [Iodinated Diagnostic Agents]     Blood Pressure dropped----pt was pre-medicated with 13 hour prep and did fine with pre-meds--amy 03/09/11   . Penicillins     REACTION: gi upset  . Propoxyphene-Acetaminophen Other (See Comments)    Sharp pains- headache  . Sulfonamide Derivatives Hives and Itching    Past Medical History  Diagnosis Date  . DJD (degenerative joint disease)     L wrist  . Lumbar disc disease   . HTN (hypertension)   . Anemia     NOS  . Osteoporosis     tx per [facility], Dr Prudencio Burly, thought due to heavy steriod use after 1978  . Spine fracture     hx, multiple  . Blindness of left eye     near blindness. 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 GERD (gastroesophageal reflux disease)   . DM2 (diabetes mellitus, type 2)   . Anxiety   . Depression   . BPH (benign prostatic hypertrophy)   . HLD (hyperlipidemia)   . Chronic pain syndrome   . Chronic hyponatremia   . Diabetes mellitus   . Broken foot Oct. 12, 2013    Right foot Fx  . Varicose veins     Past Surgical History  Procedure Laterality Date  . Right hand fracture  1969  . Left ankle ganglion cyst  1976  . Lumbar disc surgery  1981, 2004  . Nasal sinus surgery      multiple- x8. last 1997 with obliteration  . Left cataract  1996    right - 1997  . Left foot surgery  1998    R surgery/fracture - 1999  . Left cts  2006    R CTS - 2007  . Left wrist/hand fusion   2008  . L foot open repair jones fracture  2010     5th metetarsal   . Rotator cuff repair  2006    R, than repeat 2011, Dr Theda Sers  . Vocal surgery  1996  . Repair thoracic aorta      Family History  Problem Relation Age of Onset  . Lung cancer Father     and grandmother  .  Throat cancer      family hx - "bad living" - also lung CA, heart disease and renal failure    . Diabetes Mother     DM, also sister and MGF     Social History:  reports that he has quit smoking. He has never used smokeless tobacco. He reports that he does not drink alcohol or use illicit drugs.  Review of Systems:  HYPERTENSION: He has had only mild hypertension and has been given Coreg by cardiologist. Currently not taking Aldactone which was given by PCP for edema. On his own he has started taking 5 mg Vasotec which he had taken previously from another physician and he thinks his blood pressure is better with this. He thinks he has side effects from Coreg     Lipids: LDL is 89 and has not been on any lipid-lowering drugs  History of chronic depression treated by psychiatrist  History of osteoporosis getting Reclast annually, previous bone density showed T score -2.7, no history of hypogonadism     Examination:   BP 124/58  Pulse 67  Temp(Src) 98.7 F (37.1 C)  Resp 12  Ht 6\' 1"  (1.854 m)  Wt 227 lb 1.6 oz (103.012 kg)  BMI 29.97 kg/m2  SpO2 94%  Body mass index is 29.97 kg/(m^2).   No pedal edema  ASSESSMENT/ PLAN::   Diabetes type 2   The patient's diabetes control appears to be fairly well controlled with A1c 6.7 on Januvia alone. He has limited physical activity currently and encouraged him to start walking when he can  HYPERTENSION: Agree that he can take Vasotec 5 mg daily. He prefers not to take the Coreg  HYPONATREMIA: He has had a drop in sodium level to 126 and encouraged him to start back on low dose Florinef and monitor for edema. This has been effective  previously  Osteoporosis: He will be scheduled for his annual Reclast infusion in December  Mannie Ohlin 04/05/2013, 2:54 PM

## 2013-04-05 NOTE — Patient Instructions (Signed)
Please check blood sugars at least half the time about 2 hours after any meal and 3x per week on waking up. Please bring blood sugar monitor to each visit  Fludrocortisone 3x per week

## 2013-04-20 ENCOUNTER — Encounter: Payer: Self-pay | Admitting: Vascular Surgery

## 2013-04-23 ENCOUNTER — Ambulatory Visit (INDEPENDENT_AMBULATORY_CARE_PROVIDER_SITE_OTHER): Payer: Medicare Other | Admitting: Vascular Surgery

## 2013-04-23 ENCOUNTER — Encounter: Payer: Self-pay | Admitting: Vascular Surgery

## 2013-04-23 VITALS — BP 156/78 | HR 83 | Resp 18 | Ht 73.0 in | Wt 225.0 lb

## 2013-04-23 DIAGNOSIS — I83893 Varicose veins of bilateral lower extremities with other complications: Secondary | ICD-10-CM | POA: Diagnosis not present

## 2013-04-23 NOTE — Progress Notes (Signed)
Laser Ablation Procedure      Date: 04/23/2013    Kabren Penalver DOB:1945/10/26  Consent signed: Yes  Surgeon:J.D. Kellie Simmering  Procedure: Laser Ablation: right Small Saphenous Vein  BP 156/78  Pulse 83  Resp 18  Ht 6\' 1"  (1.854 m)  Wt 225 lb (102.059 kg)  BMI 29.69 kg/m2  Start time: 1:05   End time: 1:35  Tumescent Anesthesia: 120 cc 0.9% NaCl with 50 cc Lidocaine HCL with 1% Epi and 15 cc 8.4% NaHCO3  Local Anesthesia: 4 cc Lidocaine HCL and NaHCO3 (ratio 2:1)  Pulsed mode: 15 watts, 500 ms delay, 1.0 duration Total energy: 1287, total pulses: 86, total time: 1:26       Patient tolerated procedure well: Yes  Notes:   Description of Procedure:  After marking the course of the saphenous vein and the secondary varicosities in the standing position, the patient was placed on the operating table in the lateral position, and the right leg was prepped and draped in sterile fashion. Local anesthetic was administered, and under ultrasound guidance the saphenous vein was accessed with a micro needle and guide wire; then the micro puncture sheath was placed. A guide wire was inserted to the saphenopopliteal junction, followed by a 5 french sheath.  The position of the sheath and then the laser fiber below the junction was confirmed using the ultrasound and visualization of the aiming beam.  Tumescent anesthesia was administered along the course of the saphenous vein using ultrasound guidance. Protective laser glasses were placed on the patient, and the laser was fired at 15 watts.  For a total of 1287 joules.  A steri strip was applied to the puncture site.  The patient was then put into Trendelenburg position.  Loca  ABD pads and thigh high compression stockings were applied.  Ace wrap bandages were applied over the phlebectomy sites and at the top of the saphenopopliteal junction.  Blood loss was less than 15 cc.  The patient ambulated out of the operating room having tolerated the procedure  well.

## 2013-04-23 NOTE — Progress Notes (Signed)
Subjective:     Patient ID: [name], male   DOB: 12-25-1945, 67 y.o.   MRN: [id number]  HPI this 67 year old male with laser ablation of the right small saphenous vein performed under local tumescent anesthesia for venous hypertension and swelling with painful varicosities in the right calf. Total of 1287 J of energy was utilized.   Review of Systems     Objective:   Physical Exam BP 156/78  Pulse 83  Resp 18  Ht 6\' 1"  (1.854 m)  Wt 225 lb (102.059 kg)  BMI 29.69 kg/m2       Assessment:     Well-tolerated laser ablation right small saphenous vein performed under local tumescent anesthesia for venous hypertension    Plan:     Return in one week for venous duplex exam to confirm closure right small saphenous vein

## 2013-04-24 ENCOUNTER — Telehealth: Payer: Self-pay | Admitting: *Deleted

## 2013-04-24 NOTE — Telephone Encounter (Signed)
Pt unable to take Ibuprofen so taking Tylenol and using ice. He slept well and is only having a little pain. Reminded him of his fu appt next week.

## 2013-04-27 ENCOUNTER — Encounter: Payer: Self-pay | Admitting: Vascular Surgery

## 2013-04-30 ENCOUNTER — Ambulatory Visit (INDEPENDENT_AMBULATORY_CARE_PROVIDER_SITE_OTHER): Payer: Medicare Other | Admitting: Vascular Surgery

## 2013-04-30 ENCOUNTER — Encounter (INDEPENDENT_AMBULATORY_CARE_PROVIDER_SITE_OTHER): Payer: Medicare Other | Admitting: *Deleted

## 2013-04-30 ENCOUNTER — Encounter: Payer: Self-pay | Admitting: Vascular Surgery

## 2013-04-30 VITALS — BP 128/68 | HR 59 | Resp 18 | Ht 73.0 in | Wt 225.0 lb

## 2013-04-30 DIAGNOSIS — I83893 Varicose veins of bilateral lower extremities with other complications: Secondary | ICD-10-CM | POA: Diagnosis not present

## 2013-04-30 DIAGNOSIS — Z48812 Encounter for surgical aftercare following surgery on the circulatory system: Secondary | ICD-10-CM

## 2013-04-30 NOTE — Progress Notes (Signed)
Subjective:     Patient ID: [name], male   DOB: 12-28-1945, 67 y.o.   MRN: [id number]  HPI this 67 year old male returns 1 week post laser ablation right small saphenous vein for venous hypertension due to reflux with pain and swelling. He said he has had some mild discomfort along the course of the small saphenous vein in the calf but no change in the edema and no other symptoms. He has been wearing his long light elastic compression stocking and taking ibuprofen as instructed.  Past Medical History  Diagnosis Date  . DJD (degenerative joint disease)     L wrist  . Lumbar disc disease   . HTN (hypertension)   . Anemia     NOS  . Osteoporosis     tx per [facility], Dr Prudencio Burly, thought due to heavy steriod use after 1978  . Spine fracture     hx, multiple  . Blindness of left eye     near blindness. 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 GERD (gastroesophageal reflux disease)   . DM2 (diabetes mellitus, type 2)   . Anxiety   . Depression   . BPH (benign prostatic hypertrophy)   . HLD (hyperlipidemia)   . Chronic pain syndrome   . Chronic hyponatremia   . Diabetes mellitus   . Broken foot Oct. 12, 2013    Right foot Fx  . Varicose veins     History  Substance Use Topics  . Smoking status: Former Research scientist (life sciences)  . Smokeless tobacco: Never Used  . Alcohol Use: No    Family History  Problem Relation Age of Onset  . Lung cancer Father     and grandmother  . Throat cancer      family hx - "bad living" - also lung CA, heart disease and renal failure    . Diabetes Mother     DM, also sister and MGF     Allergies  Allergen Reactions  . Ivp Dye [Iodinated Diagnostic Agents]     Blood Pressure dropped----pt was pre-medicated with 13 hour prep and did fine with pre-meds--amy 03/09/11   . Penicillins     REACTION: gi upset  . Propoxyphene-Acetaminophen Other (See Comments)    Sharp pains- headache  . Sulfonamide Derivatives Hives and Itching    Current outpatient prescriptions:BAYER BREEZE 2 TEST DISK,  USE TO TEST BLOOD SUGAR AS DIRECTED 3 TIMES A DAY, Disp: 100 each, Rfl: 0;  Blood Glucose Monitoring Suppl (BAYER BREEZE 2 SYSTEM) W/DEVICE KIT, Use to check blood sugars once a day, Disp: 1 kit, Rfl: 0;  buPROPion (WELLBUTRIN XL) 300 MG 24 hr tablet, Take 300 mg by mouth every morning. , Disp: , Rfl: ;  carvedilol (COREG) 6.25 MG tablet, Take 6.25 mg by mouth daily. , Disp: , Rfl:  celecoxib (CELEBREX) 100 MG capsule, Take 200 mg by mouth 2 (two) times daily as needed. For pain, Disp: , Rfl: ;  Cholecalciferol (VITAMIN D) 1000 UNITS capsule, Take 6,000 Units by mouth daily. , Disp: , Rfl: ;  clonazePAM (KLONOPIN) 1 MG tablet, Take 1 mg by mouth 3 (three) times daily. , Disp: , Rfl: ;  enalapril (VASOTEC) 5 MG tablet, Take 1 tablet (5 mg total) by mouth daily. Take 1/2 tablet daily, Disp: 30 tablet, Rfl: 4 fentaNYL (DURAGESIC - DOSED MCG/HR) 75 MCG/HR, Place 1 patch onto the skin every 3 (three) days. For pain, Disp: , Rfl: ;  fludrocortisone (FLORINEF) 0.1 MG tablet, Take 0.1 mg by mouth daily. 1/2 tablet  every other day, Disp: , Rfl: ;  glimepiride (AMARYL) 1 MG tablet, Take 1 mg by mouth daily as needed. , Disp: , Rfl: ;  L-Methylfolate-B6-B12 (VITACIRC-B) 3-35-2 MG TABS, Take 1 tablet by mouth 2 (two) times daily. , Disp: , Rfl:  MICROLET LANCETS MISC, , Disp: , Rfl: ;  Omega-3 Fatty Acids (FISH OIL PO), Take 1 tablet by mouth daily., Disp: , Rfl: ;  omeprazole (PRILOSEC) 40 MG capsule, Take 40 mg by mouth 2 (two) times daily. , Disp: , Rfl: ;  oral electrolytes (THERMOTABS) TABS tablet, Take 6 tablets by mouth., Disp: , Rfl: ;  oxyCODONE (ROXICODONE) 15 MG immediate release tablet, continuous as needed., Disp: , Rfl:  oxyCODONE-acetaminophen (PERCOCET) 10-325 MG per tablet, Take 1 tablet by mouth 4 (four) times daily as needed. For pain, Disp: , Rfl: ;  oxyCODONE-acetaminophen (PERCOCET/ROXICET) 5-325 MG per tablet, Take 2 tablets by mouth every 4 (four) hours as needed for pain., Disp: 6 tablet, Rfl: 0;   sertraline (ZOLOFT) 100 MG tablet, Take 100 mg by mouth daily. , Disp: , Rfl: ;  sitaGLIPtin (JANUVIA) 100 MG tablet, Take 100 mg by mouth daily., Disp: , Rfl:  SODIUM & POTASSIUM BICARBONATE PO, Take 2 tablets by mouth 4 (four) times daily., Disp: , Rfl: ;  spironolactone (ALDACTONE) 25 MG tablet, Take 25 mg by mouth daily., Disp: , Rfl: ;  Tamsulosin HCl (FLOMAX) 0.4 MG CAPS, Take 0.1 mg by mouth 2 (two) times daily. , Disp: , Rfl: ;  zaleplon (SONATA) 10 MG capsule, Take 10 mg by mouth daily as needed., Disp: , Rfl: ;  zonisamide (ZONEGRAN) 50 MG capsule, Take 150 mg by mouth at bedtime.  , Disp: , Rfl:   BP 128/68  Pulse 59  Resp 18  Ht 6\' 1"  (1.854 m)  Wt 225 lb (102.059 kg)  BMI 29.69 kg/m2  Body mass index is 29.69 kg/(m^2).           Review of Systems denies chest pain, dyspnea on exertion, PND, orthopnea, hemoptysis    Objective:   Physical Exam BP 128/68  Pulse 59  Resp 18  Ht 6\' 1"  (1.854 m)  Wt 225 lb (102.059 kg)  BMI 29.69 kg/m2  General well-developed well-nourished male no apparent stress alert and oriented x3 Right leg with mild tenderness along the course of small saphenous vein. 3+ dorsalis pedis pulse palpable. Minimal edema distally.  Today I ordered a venous duplex exam of the right leg which are reviewed and interpreted. The right small saphenous vein is totally closed with no evidence of DVT.     Assessment:     Successful laser ablation of right small saphenous vein and left great saphenous vein for venous hypertension with pain and swelling    Plan:     Return to see [place] on when necessary basis

## 2013-05-21 DIAGNOSIS — M79609 Pain in unspecified limb: Secondary | ICD-10-CM | POA: Diagnosis not present

## 2013-05-21 DIAGNOSIS — G894 Chronic pain syndrome: Secondary | ICD-10-CM | POA: Diagnosis not present

## 2013-05-21 DIAGNOSIS — M5137 Other intervertebral disc degeneration, lumbosacral region: Secondary | ICD-10-CM | POA: Diagnosis not present

## 2013-05-21 DIAGNOSIS — Z79899 Other long term (current) drug therapy: Secondary | ICD-10-CM | POA: Diagnosis not present

## 2013-05-21 DIAGNOSIS — H5710 Ocular pain, unspecified eye: Secondary | ICD-10-CM | POA: Diagnosis not present

## 2013-06-13 ENCOUNTER — Other Ambulatory Visit: Payer: Self-pay | Admitting: *Deleted

## 2013-06-13 MED ORDER — SITAGLIPTIN PHOSPHATE 100 MG PO TABS: 100.0000 mg | ORAL_TABLET | Freq: Every day | ORAL | Status: DC

## 2013-07-10 ENCOUNTER — Other Ambulatory Visit: Payer: Self-pay | Admitting: Endocrinology

## 2013-07-10 DIAGNOSIS — M810 Age-related osteoporosis without current pathological fracture: Secondary | ICD-10-CM

## 2013-07-17 ENCOUNTER — Other Ambulatory Visit: Payer: Self-pay | Admitting: Endocrinology

## 2013-07-18 ENCOUNTER — Ambulatory Visit (INDEPENDENT_AMBULATORY_CARE_PROVIDER_SITE_OTHER)
Admission: RE | Admit: 2013-07-18 | Discharge: 2013-07-18 | Disposition: A | Discharge status: Home / Self Care | Payer: Medicare Other | Source: Ambulatory Visit | Attending: Endocrinology | Admitting: Endocrinology

## 2013-07-18 DIAGNOSIS — M810 Age-related osteoporosis without current pathological fracture: Secondary | ICD-10-CM

## 2013-07-19 ENCOUNTER — Other Ambulatory Visit: Payer: Medicare Other

## 2013-07-30 ENCOUNTER — Other Ambulatory Visit (INDEPENDENT_AMBULATORY_CARE_PROVIDER_SITE_OTHER): Payer: Medicare Other

## 2013-07-30 DIAGNOSIS — E871 Hypo-osmolality and hyponatremia: Secondary | ICD-10-CM

## 2013-07-30 DIAGNOSIS — E119 Type 2 diabetes mellitus without complications: Secondary | ICD-10-CM

## 2013-07-30 DIAGNOSIS — M810 Age-related osteoporosis without current pathological fracture: Secondary | ICD-10-CM | POA: Diagnosis not present

## 2013-07-30 LAB — COMPREHENSIVE METABOLIC PANEL
AST: 31 U/L (ref 0–37)
Albumin: 3.9 g/dL (ref 3.5–5.2)
BUN: 10 mg/dL (ref 6–23)
CO2: 28 mEq/L (ref 19–32)
Calcium: 8.2 mg/dL — ABNORMAL LOW (ref 8.4–10.5)
Chloride: 94 mEq/L — ABNORMAL LOW (ref 96–112)
Creatinine, Ser: 0.8 mg/dL (ref 0.4–1.5)
GFR: 96.78 mL/min (ref >60.00)
Glucose, Bld: 101 mg/dL — ABNORMAL HIGH (ref 70–99)
Potassium: 4.7 mEq/L (ref 3.5–5.1)

## 2013-08-01 LAB — TESTOSTERONE, TOTAL, LC/MS: Testosterone, total: 586.3 ng/dL (ref 348.0–1197.0)

## 2013-08-02 ENCOUNTER — Ambulatory Visit (INDEPENDENT_AMBULATORY_CARE_PROVIDER_SITE_OTHER): Payer: Medicare Other | Admitting: Endocrinology

## 2013-08-02 ENCOUNTER — Encounter: Payer: Self-pay | Admitting: Endocrinology

## 2013-08-02 VITALS — BP 118/78 | HR 66 | Temp 98.1°F | Resp 12 | Ht 73.0 in | Wt 222.7 lb

## 2013-08-02 DIAGNOSIS — E119 Type 2 diabetes mellitus without complications: Secondary | ICD-10-CM

## 2013-08-02 DIAGNOSIS — E871 Hypo-osmolality and hyponatremia: Secondary | ICD-10-CM

## 2013-08-02 DIAGNOSIS — I10 Essential (primary) hypertension: Secondary | ICD-10-CM

## 2013-08-02 DIAGNOSIS — M810 Age-related osteoporosis without current pathological fracture: Secondary | ICD-10-CM

## 2013-08-02 NOTE — Patient Instructions (Addendum)
Tums extra strength 2 daily  Florinef 0.mg, 3x per week  Please check blood sugars at least half the time about 2 hours after any meal and as directed on waking up. Please bring blood sugar monitor to each visit

## 2013-08-02 NOTE — Progress Notes (Signed)
Patient ID: [name], male   DOB: [birth year], 67 y.o.   MRN: [id number]  Reason for Appointment: Diabetes follow-up   History of Present Illness   Diagnosis: Type 2 DIABETES MELITUS  He has had relatively mild diabetes which is usually well controlled but Januvia alone.    Previously was taking Amaryl which would cause some hypoglycemia Currently not taking Amaryl also previously has taken this as needed for high readings Difficult to assess his home control as he is checking only in the mornings despite reminders His A1c is relatively higher at 7% now, usually has been normal  Monitors blood glucose: Once a day.    Glucometer: Breeze      Blood Glucose readings from meter download: readings before breakfast: 54-145, average 107  Hypoglycemia frequency:  rare, has only one low reading         Meals: 3 meals per day.          Physical activity: exercise: Minimal, limited activity  HYPONATREMIA: This is chronic and probably not related to SIADH with previous osmolality studies.  He had tried taking salt tablets previously without adequate results Previously he had benefited significantly from empirically adding fludrocortisone. This was discontinued at some point because of edema, however was having other problems at this time Currently sodium is again low at 126 although he is still not complaining of nausea, anorexia or somnolence. He had tried to increase his salt tablet intake but unable to remember doing this. Also even though he was recommended Florinef last time 3 days a week but he did not ask for a refill and did not start this prescription No recent edema  PROBLEM 3: He appears to have asymptomatic hypocalcemia even though his vitamin D level has been previously normal and he has no metabolic bone disease or renal insufficiency. Has had mild hypocalcemia previously also He does not take calcium supplements as they upset his stomach.  Labs:  Appointment on 07/30/2013   Component Date Value Range Status  . Hemoglobin A1C 07/30/2013 7.0* 4.6 - 6.5 % Final   Glycemic Control Guidelines for People with Diabetes:Non Diabetic:  <6%Goal of Therapy: <7%Additional Action Suggested:  >8%   . Sodium 07/30/2013 126* 135 - 145 mEq/L Final  . Potassium 07/30/2013 4.7  3.5 - 5.1 mEq/L Final  . Chloride 07/30/2013 94* 96 - 112 mEq/L Final  . CO2 07/30/2013 28  19 - 32 mEq/L Final  . Glucose, Bld 07/30/2013 101* 70 - 99 mg/dL Final  . BUN 07/30/2013 10  6 - 23 mg/dL Final  . Creatinine, Ser 07/30/2013 0.8  0.4 - 1.5 mg/dL Final  . Total Bilirubin 07/30/2013 0.5  0.3 - 1.2 mg/dL Final  . Alkaline Phosphatase 07/30/2013 68  39 - 117 U/L Final  . AST 07/30/2013 31  0 - 37 U/L Final  . ALT 07/30/2013 31  0 - 53 U/L Final  . Total Protein 07/30/2013 7.2  6.0 - 8.3 g/dL Final  . Albumin 07/30/2013 3.9  3.5 - 5.2 g/dL Final  . Calcium 07/30/2013 8.2* 8.4 - 10.5 mg/dL Final  . GFR 07/30/2013 96.78  >60.00 mL/min Final  . TSH 07/30/2013 2.63  0.35 - 5.50 uIU/mL Final  . Testosterone, total 07/30/2013 586.3  348.0 - 1197.0 ng/dL Final  . Comment 07/30/2013 Comment   Final   Comment: Adult male reference interval is based on a population of Anthony males  up to 66 years old.      Medication List       This list is accurate as of: 08/02/13  3:00 PM.  Always use your most recent med list.               BAYER BREEZE 2 SYSTEM W/DEVICE Kit  Use to check blood sugars once a day     BAYER BREEZE 2 TEST Disk  Generic drug:  Glucose Blood  USE TO TEST BLOOD GLUCOSE THREE TIMES DAILY     buPROPion 300 MG 24 hr tablet  Commonly known as:  WELLBUTRIN XL  Take 300 mg by mouth every morning.     carvedilol 6.25 MG tablet  Commonly known as:  COREG  Take 6.25 mg by mouth daily.     celecoxib 100 MG capsule  Commonly known as:  CELEBREX  Take 200 mg by mouth 2 (two) times daily as needed. For pain     clonazePAM 1 MG tablet  Commonly known as:   KLONOPIN  Take 1 mg by mouth 3 (three) times daily.     enalapril 5 MG tablet  Commonly known as:  VASOTEC  Take 1 tablet (5 mg total) by mouth daily. Take 1/2 tablet daily     fentaNYL 75 MCG/HR  Commonly known as:  [place] - dosed mcg/hr  Place 1 patch onto the skin every 3 (three) days. For pain     FISH OIL PO  Take 1 tablet by mouth daily.     FLOMAX 0.4 MG Caps capsule  Generic drug:  tamsulosin  Take 0.1 mg by mouth 2 (two) times daily.     fludrocortisone 0.1 MG tablet  Commonly known as:  FLORINEF  Take 0.1 mg by mouth daily. 1/2 tablet every other day     glimepiride 1 MG tablet  Commonly known as:  AMARYL  Take 1 mg by mouth daily as needed.     MICROLET LANCETS Misc     omeprazole 40 MG capsule  Commonly known as:  PRILOSEC  Take 40 mg by mouth 2 (two) times daily.     oral electrolytes Tabs tablet  Take 6 tablets by mouth.     oxyCODONE 15 MG immediate release tablet  Commonly known as:  ROXICODONE  continuous as needed.     oxyCODONE-acetaminophen 10-325 MG per tablet  Commonly known as:  PERCOCET  Take 1 tablet by mouth 4 (four) times daily as needed. For pain     oxyCODONE-acetaminophen 5-325 MG per tablet  Commonly known as:  PERCOCET/ROXICET  Take 2 tablets by mouth every 4 (four) hours as needed for pain.     sertraline 100 MG tablet  Commonly known as:  ZOLOFT  Take 100 mg by mouth daily.     sitaGLIPtin 100 MG tablet  Commonly known as:  JANUVIA  Take 1 tablet (100 mg total) by mouth daily.     SODIUM & POTASSIUM BICARBONATE PO  Take 2 tablets by mouth 4 (four) times daily.     spironolactone 25 MG tablet  Commonly known as:  ALDACTONE  Take 25 mg by mouth daily.     VITACIRC-B 3-35-2 MG Tabs  Take 1 tablet by mouth 2 (two) times daily.     Vitamin D 1000 UNITS capsule  Take 6,000 Units by mouth daily.     zaleplon 10 MG capsule  Commonly known as:  SONATA  Take 10 mg by mouth daily as needed.  zonisamide 50 MG capsule   Commonly known as:  ZONEGRAN  Take 150 mg by mouth at bedtime.        Allergies:  Allergies  Allergen Reactions  . Ivp Dye [Iodinated Diagnostic Agents]     Blood Pressure dropped----pt was pre-medicated with 13 hour prep and did fine with pre-meds--amy 03/09/11   . Penicillins     REACTION: gi upset  . Propoxyphene-Acetaminophen Other (See Comments)    Sharp pains- headache  . Sulfonamide Derivatives Hives and Itching    Past Medical History  Diagnosis Date  . DJD (degenerative joint disease)     L wrist  . Lumbar disc disease   . HTN (hypertension)   . Anemia     NOS  . Osteoporosis     tx per [facility], Dr Prudencio Burly, thought due to heavy steriod use after 1978  . Spine fracture     hx, multiple  . Blindness of left eye     near blindness. 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 GERD (gastroesophageal reflux disease)   . DM2 (diabetes mellitus, type 2)   . Anxiety   . Depression   . BPH (benign prostatic hypertrophy)   . HLD (hyperlipidemia)   . Chronic pain syndrome   . Chronic hyponatremia   . Diabetes mellitus   . Broken foot Oct. 12, 2013    Right foot Fx  . Varicose veins     Past Surgical History  Procedure Laterality Date  . Right hand fracture  1969  . Left ankle ganglion cyst  1976  . Lumbar disc surgery  1981, 2004  . Nasal sinus surgery      multiple- x8. last 1997 with obliteration  . Left cataract  1996    right - 1997  . Left foot surgery  1998    R surgery/fracture - 1999  . Left cts  2006    R CTS - 2007  . Left wrist/hand fusion  2008  . L foot open repair jones fracture  2010     5th metetarsal   . Rotator cuff repair  2006    R, than repeat 2011, Dr Theda Sers  . Vocal surgery  1996  . Repair thoracic aorta      Family History  Problem Relation Age of Onset  . Lung cancer Father     and grandmother  . Throat cancer      family hx - "bad living" - also lung CA, heart disease and renal failure    . Diabetes Mother     DM, also sister and MGF     Social  History:  reports that he has quit smoking. He has never used smokeless tobacco. He reports that he does not drink alcohol or use illicit drugs.  Review of Systems:  HYPERTENSION: He has had only mild hypertension and had been given Coreg by cardiologist. Currently not taking Aldactone which was given by PCP for edema.  He has been taking 5 mg Vasotec which he had taken previously from another physician and  his blood pressure is better with this. He thinks he has side effects from Coreg     Lipids: LDL has been below 100 and has not been on any lipid-lowering drugs  History of chronic depression treated by psychiatrist  History of osteoporosis getting Reclast annually, previous bone density showed T score -2.7 and now it is 2.0 at the femoral neck,  no history of secondary causes like hypogonadism and his testosterone level is  again normal Last fracture 10/13, foot, traumatic Vitamin D3 has been continued, has been taking at least 5000 units a day and the last level was over 60      Examination:   BP 118/78  Pulse 66  Temp(Src) 98.1 F (36.7 C)  Resp 12  Ht 6\' 1"  (1.854 m)  Wt 222 lb 11.2 oz (101.016 kg)  BMI 29.39 kg/m2  SpO2 90%  Body mass index is 29.39 kg/(m^2).   No pedal edema  ASSESSMENT/ PLAN:   Diabetes type 2   The patient's diabetes control appears to be only under fair control with A1c now relatively high at 7% This is despite fairly good readings in the mornings He thinks he can do better on his diet and may well have significant postprandial hyperglycemia He was made aware of the need for checking readings 2 hours after eating and targets He has limited physical activity currently and encouraged him to start walking more for weight loss May consider GLP-1 drug if he continues to have high readings and weight gain  HYPERTENSION: Agree that he can continue Vasotec 5 mg daily. He prefers not to take the Coreg and has no atrial arrhythmias  HYPONATREMIA: He has  had a persistent low sodium level of 126 and again asked him to start back on low dose Florinef and monitor for edema. Prescription sent. This has been effective previously. Salt tablets may be also used, optional  Osteoporosis: He will be scheduled for his annual Reclast infusion after his next visit. Will postpone this because of low calcium level  HYPOCALCEMIA: He will start taking TUMS. Consider vitamin D level on the next visit also but previously has been normal and he is continuing to be compliant with vitamin D3   [name] 08/02/2013, 3:00 PM

## 2013-08-06 ENCOUNTER — Other Ambulatory Visit: Payer: Self-pay | Admitting: *Deleted

## 2013-08-06 ENCOUNTER — Telehealth: Payer: Self-pay | Admitting: *Deleted

## 2013-08-06 MED ORDER — FLUDROCORTISONE ACETATE 0.1 MG PO TABS: ORAL_TABLET | ORAL | Status: DC

## 2013-08-06 NOTE — Telephone Encounter (Signed)
I have sent a prescription for Florinef. He is supposed to take OTC TUMS, does he need prescription for strips?

## 2013-08-06 NOTE — Telephone Encounter (Signed)
Patient called stating you were going to send him in a few prescriptions but nothing has been sent yet?

## 2013-08-28 ENCOUNTER — Other Ambulatory Visit: Payer: Self-pay | Admitting: Endocrinology

## 2013-09-05 DIAGNOSIS — L98499 Non-pressure chronic ulcer of skin of other sites with unspecified severity: Secondary | ICD-10-CM | POA: Diagnosis not present

## 2013-09-06 DIAGNOSIS — Z79899 Other long term (current) drug therapy: Secondary | ICD-10-CM | POA: Diagnosis not present

## 2013-09-06 DIAGNOSIS — M5137 Other intervertebral disc degeneration, lumbosacral region: Secondary | ICD-10-CM | POA: Diagnosis not present

## 2013-09-06 DIAGNOSIS — H5710 Ocular pain, unspecified eye: Secondary | ICD-10-CM | POA: Diagnosis not present

## 2013-09-06 DIAGNOSIS — M79609 Pain in unspecified limb: Secondary | ICD-10-CM | POA: Diagnosis not present

## 2013-09-06 DIAGNOSIS — G894 Chronic pain syndrome: Secondary | ICD-10-CM | POA: Diagnosis not present

## 2013-09-10 DIAGNOSIS — M6530 Trigger finger, unspecified finger: Secondary | ICD-10-CM | POA: Diagnosis not present

## 2013-09-20 ENCOUNTER — Other Ambulatory Visit: Payer: Self-pay | Admitting: Surgery

## 2013-09-20 DIAGNOSIS — Z48812 Encounter for surgical aftercare following surgery on the circulatory system: Secondary | ICD-10-CM

## 2013-09-20 DIAGNOSIS — I739 Peripheral vascular disease, unspecified: Secondary | ICD-10-CM

## 2013-09-20 DIAGNOSIS — I6529 Occlusion and stenosis of unspecified carotid artery: Secondary | ICD-10-CM

## 2013-10-10 DIAGNOSIS — M6530 Trigger finger, unspecified finger: Secondary | ICD-10-CM | POA: Diagnosis not present

## 2013-10-24 DIAGNOSIS — R059 Cough, unspecified: Secondary | ICD-10-CM | POA: Diagnosis not present

## 2013-10-24 DIAGNOSIS — R05 Cough: Secondary | ICD-10-CM | POA: Diagnosis not present

## 2013-10-31 ENCOUNTER — Other Ambulatory Visit: Payer: Medicare Other

## 2013-11-01 DIAGNOSIS — M79609 Pain in unspecified limb: Secondary | ICD-10-CM | POA: Diagnosis not present

## 2013-11-01 DIAGNOSIS — L84 Corns and callosities: Secondary | ICD-10-CM | POA: Diagnosis not present

## 2013-11-05 ENCOUNTER — Ambulatory Visit: Payer: Medicare Other | Admitting: Surgery

## 2013-11-05 ENCOUNTER — Encounter (HOSPITAL_COMMUNITY): Payer: Medicare Other

## 2013-11-05 ENCOUNTER — Ambulatory Visit: Payer: Medicare Other | Admitting: Endocrinology

## 2013-11-05 ENCOUNTER — Other Ambulatory Visit (HOSPITAL_COMMUNITY): Payer: Medicare Other

## 2013-11-05 DIAGNOSIS — M5137 Other intervertebral disc degeneration, lumbosacral region: Secondary | ICD-10-CM | POA: Diagnosis not present

## 2013-11-05 DIAGNOSIS — M79609 Pain in unspecified limb: Secondary | ICD-10-CM | POA: Diagnosis not present

## 2013-11-05 DIAGNOSIS — G529 Cranial nerve disorder, unspecified: Secondary | ICD-10-CM | POA: Diagnosis not present

## 2013-11-05 DIAGNOSIS — G894 Chronic pain syndrome: Secondary | ICD-10-CM | POA: Diagnosis not present

## 2013-11-05 DIAGNOSIS — Z79899 Other long term (current) drug therapy: Secondary | ICD-10-CM | POA: Diagnosis not present

## 2013-11-07 ENCOUNTER — Encounter: Payer: Self-pay | Admitting: Endocrinology

## 2013-11-07 ENCOUNTER — Ambulatory Visit (INDEPENDENT_AMBULATORY_CARE_PROVIDER_SITE_OTHER): Payer: Medicare Other | Admitting: Endocrinology

## 2013-11-07 VITALS — BP 130/44 | HR 71 | Temp 98.4°F | Resp 16 | Ht 73.0 in | Wt 229.2 lb

## 2013-11-07 DIAGNOSIS — E1149 Type 2 diabetes mellitus with other diabetic neurological complication: Secondary | ICD-10-CM | POA: Diagnosis not present

## 2013-11-07 DIAGNOSIS — I6529 Occlusion and stenosis of unspecified carotid artery: Secondary | ICD-10-CM | POA: Diagnosis not present

## 2013-11-07 DIAGNOSIS — M810 Age-related osteoporosis without current pathological fracture: Secondary | ICD-10-CM

## 2013-11-07 DIAGNOSIS — E119 Type 2 diabetes mellitus without complications: Secondary | ICD-10-CM | POA: Diagnosis not present

## 2013-11-07 DIAGNOSIS — E871 Hypo-osmolality and hyponatremia: Secondary | ICD-10-CM | POA: Diagnosis not present

## 2013-11-07 DIAGNOSIS — L84 Corns and callosities: Secondary | ICD-10-CM | POA: Diagnosis not present

## 2013-11-07 DIAGNOSIS — M79609 Pain in unspecified limb: Secondary | ICD-10-CM | POA: Diagnosis not present

## 2013-11-07 LAB — BASIC METABOLIC PANEL
BUN: 14 mg/dL (ref 6–23)
CO2: 26 meq/L (ref 19–32)
Calcium: 8.6 mg/dL (ref 8.4–10.5)
Chloride: 94 mEq/L — ABNORMAL LOW (ref 96–112)
Creatinine, Ser: 0.8 mg/dL (ref 0.4–1.5)
GFR: 103.8 mL/min (ref >60.00)
GLUCOSE: 92 mg/dL (ref 70–99)
POTASSIUM: 4.3 meq/L (ref 3.5–5.1)
Sodium: 127 mEq/L — ABNORMAL LOW (ref 135–145)

## 2013-11-07 LAB — LIPID PANEL
CHOL/HDL RATIO: 4
Cholesterol: 143 mg/dL (ref 0–200)
HDL: 38.5 mg/dL — ABNORMAL LOW (ref >39.00)
LDL CALC: 70 mg/dL (ref 0–99)
Triglycerides: 174 mg/dL — ABNORMAL HIGH (ref 0.0–149.0)
VLDL: 34.8 mg/dL (ref 0.0–40.0)

## 2013-11-07 LAB — HEMOGLOBIN A1C: Hgb A1c MFr Bld: 6.9 % — ABNORMAL HIGH (ref 4.6–6.5)

## 2013-11-07 NOTE — Progress Notes (Signed)
Patient ID: [name], male   DOB: [birth year], 68 y.o.   MRN: [id number]   Reason for Appointment: Diabetes follow-up   History of Present Illness   Diagnosis: Type 2 DIABETES MELITUS  He has had relatively mild diabetes which is usually well controlled with Januvia alone.    Previously was taking 1 mg Amaryl as needed for high readings which would cause  hypoglycemia at times Currently not taking Amaryl  Difficult to assess his home control as he is checking only in the mornings and not after meals His A1c on the last visit was relatively higher at 7%, usually has been normal Has gained weight from inconsistent diet  Monitors blood glucose:  occasionally.    Glucometer: Breeze      Blood Glucose readings from recall: readings before breakfast: 100-125  PC <160-170  Hypoglycemia frequency:  minimal now      Meals: 3 meals per day.          Physical activity: exercise: Minimal, limited activity  Wt Readings from Last 3 Encounters:  11/07/13 229 lb 3.2 oz (103.964 kg)  08/02/13 222 lb 11.2 oz (101.016 kg)  04/30/13 225 lb (102.059 kg)    Lab Results  Component Value Date   HGBA1C 7.0* 07/30/2013   HGBA1C 6.7* 04/03/2013   HGBA1C 6.8* 03/13/2010   Lab Results  Component Value Date   MICROALBUR 0.2 04/03/2013   LDLCALC 89 04/03/2013   CREATININE 0.8 07/30/2013    HYPONATREMIA: This is chronic and probably not related to SIADH with previous normal urine osmolality.  He had tried taking salt tablets previously without adequate results Previously he had benefited significantly from adding fludrocortisone and is now on this 3 times a week since his last visit when sodium was 126. However usually he is asymptomatic from the low sodium He thinks he feels better when he increases his salt intake  No recent edema   Labs:  Pending    Medication List       This list is accurate as of: 11/07/13  3:07 PM.  Always use your most recent med list.               BAYER BREEZE 2  SYSTEM W/DEVICE Kit  Use to check blood sugars once a day     BAYER BREEZE 2 TEST Disk  Generic drug:  Glucose Blood  USE TO TEST BLOOD GLUCOSE THREE TIMES DAILY     buPROPion 300 MG 24 hr tablet  Commonly known as:  WELLBUTRIN XL  Take 300 mg by mouth every morning.     carvedilol 6.25 MG tablet  Commonly known as:  COREG  Take 6.25 mg by mouth daily.     celecoxib 100 MG capsule  Commonly known as:  CELEBREX  Take 200 mg by mouth 2 (two) times daily as needed. For pain     clonazePAM 1 MG tablet  Commonly known as:  KLONOPIN  Take 1 mg by mouth 3 (three) times daily.     enalapril 5 MG tablet  Commonly known as:  VASOTEC  TAKE 1 TABLET BY MOUTH DAILY     fentaNYL 75 MCG/HR  Commonly known as:  [place] - dosed mcg/hr  Place 1 patch onto the skin every 3 (three) days. For pain     FISH OIL PO  Take 1 tablet by mouth daily.     FLOMAX 0.4 MG Caps capsule  Generic drug:  tamsulosin  Take 0.1 mg by mouth 2 (two)  times daily.     fludrocortisone 0.1 MG tablet  Commonly known as:  FLORINEF  Take 1 tablet every other day     glimepiride 1 MG tablet  Commonly known as:  AMARYL  Take 1 mg by mouth daily as needed.     MICROLET LANCETS Misc     omeprazole 40 MG capsule  Commonly known as:  PRILOSEC  Take 40 mg by mouth 2 (two) times daily.     oral electrolytes Tabs tablet  Take 6 tablets by mouth.     oxyCODONE 15 MG immediate release tablet  Commonly known as:  ROXICODONE  continuous as needed.     sertraline 100 MG tablet  Commonly known as:  ZOLOFT  Take 100 mg by mouth daily.     sitaGLIPtin 100 MG tablet  Commonly known as:  JANUVIA  Take 1 tablet (100 mg total) by mouth daily.     SODIUM & POTASSIUM BICARBONATE PO  Take 2 tablets by mouth 4 (four) times daily.     spironolactone 25 MG tablet  Commonly known as:  ALDACTONE  Take 25 mg by mouth daily.     VITACIRC-B 3-35-2 MG Tabs  Take 1 tablet by mouth 2 (two) times daily.     Vitamin D  1000 UNITS capsule  Take 6,000 Units by mouth daily.     zonisamide 50 MG capsule  Commonly known as:  ZONEGRAN  Take 150 mg by mouth at bedtime.        Allergies:  Allergies  Allergen Reactions  . Ivp Dye [Iodinated Diagnostic Agents]     Blood Pressure dropped----pt was pre-medicated with 13 hour prep and did fine with pre-meds--amy 03/09/11   . Penicillins     REACTION: gi upset  . Propoxyphene N-Acetaminophen Other (See Comments)    Sharp pains- headache  . Sulfonamide Derivatives Hives and Itching    Past Medical History  Diagnosis Date  . DJD (degenerative joint disease)     L wrist  . Lumbar disc disease   . HTN (hypertension)   . Anemia     NOS  . Osteoporosis     tx per [facility], Dr Prudencio Burly, thought due to heavy steriod use after 1978  . Spine fracture     hx, multiple  . Blindness of left eye     near blindness. 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 GERD (gastroesophageal reflux disease)   . DM2 (diabetes mellitus, type 2)   . Anxiety   . Depression   . BPH (benign prostatic hypertrophy)   . HLD (hyperlipidemia)   . Chronic pain syndrome   . Chronic hyponatremia   . Diabetes mellitus   . Broken foot Oct. 12, 2013    Right foot Fx  . Varicose veins     Past Surgical History  Procedure Laterality Date  . Right hand fracture  1969  . Left ankle ganglion cyst  1976  . Lumbar disc surgery  1981, 2004  . Nasal sinus surgery      multiple- x8. last 1997 with obliteration  . Left cataract  1996    right - 1997  . Left foot surgery  1998    R surgery/fracture - 1999  . Left cts  2006    R CTS - 2007  . Left wrist/hand fusion  2008  . L foot open repair jones fracture  2010     5th metetarsal   . Rotator cuff repair  2006    R,  than repeat 2011, Dr Theda Sers  . Vocal surgery  1996  . Repair thoracic aorta      Family History  Problem Relation Age of Onset  . Lung cancer Father     and grandmother  . Throat cancer      family hx - "bad living" - also lung CA, heart  disease and renal failure    . Diabetes Mother     DM, also sister and MGF     Social History:  reports that he has quit smoking. He has never used smokeless tobacco. He reports that he does not drink alcohol or use illicit drugs.  Review of Systems:  HYPERTENSION: He has had only mild hypertension.  He has been taking 5 mg Vasotec and  his blood pressure is better with this.  Currently his taking Aldactone on the days he takes Florinef.    Lipids: LDL has been below 100 and has not been on any lipid-lowering drugs  History of chronic depression treated by psychiatrist  History of osteoporosis getting Reclast annually, previous bone density showed T score -2.7 and more recently was 2.0 at the femoral neck, Has no history of secondary causes like hypogonadism and his testosterone level is again normal Last fracture 10/13, foot, traumatic Vitamin D3 has been continued, has been taking at least 5000 units a day   Calcium was low on the last visit and he was asked to take Tums tablets but he is taking this irregularly  Neuropathy: He has numbness in his feet, long term. Recently had callus trimmed by PCP on his foot. No infections recently     Examination:   BP 130/44  Pulse 71  Temp(Src) 98.4 F (36.9 C)  Resp 16  Ht 6' 1" (1.854 m)  Wt 229 lb 3.2 oz (103.964 kg)  BMI 30.25 kg/m2  SpO2 97%  Body mass index is 30.25 kg/(m^2).   No pedal edema Foot exam done  ASSESSMENT/ PLAN:   Diabetes type 2   The patient's diabetes control appears to be only under fair control with home readings mostly in the normal range and not many high readings after meals However has gained weight He thinks he can do better on his diet and also be more active Advised him to bring his monitor for review on the next visit  HYPERTENSION: He can continue Vasotec 5 mg daily.   HYPONATREMIA: He is on low dose Florinef without any edema. Sodium to be rechecked today, may need to be reassessed with  urine osmolality and 24 urine sodium Consider some fluid restriction if sodium not improved  Osteoporosis: He will be scheduled for his annual Reclast infusion  If calcium is normal. Vitamin D level to be rechecked also  Neuropathy: Advised on foot care and given the [facility] information on this   [facility] 11/07/2013, 3:07 PM   Addendum: Labs as follows. To increase Florinef to 5 days a week Calcium and vitamin D normal, to have Reclast and will process the paperwork for this  Office Visit on 11/07/2013  Component Date Value Ref Range Status  . Sodium 11/07/2013 127* 135 - 145 mEq/L Final  . Potassium 11/07/2013 4.3  3.5 - 5.1 mEq/L Final  . Chloride 11/07/2013 94* 96 - 112 mEq/L Final  . CO2 11/07/2013 26  19 - 32 mEq/L Final  . Glucose, Bld 11/07/2013 92  70 - 99 mg/dL Final  . BUN 11/07/2013 14  6 - 23 mg/dL Final  . Creatinine, Ser 11/07/2013  0.8  0.4 - 1.5 mg/dL Final  . Calcium 11/07/2013 8.6  8.4 - 10.5 mg/dL Final  . GFR 11/07/2013 103.80  >60.00 mL/min Final  . Cholesterol 11/07/2013 143  0 - 200 mg/dL Final   ATP III Classification       Desirable:  < 200 mg/dL               Borderline High:  200 - 239 mg/dL          High:  > = 240 mg/dL  . Triglycerides 11/07/2013 174.0* 0.0 - 149.0 mg/dL Final   Normal:  <150 mg/dLBorderline High:  150 - 199 mg/dL  . HDL 11/07/2013 38.50* >39.00 mg/dL Final  . VLDL 11/07/2013 34.8  0.0 - 40.0 mg/dL Final  . LDL Cholesterol 11/07/2013 70  0 - 99 mg/dL Final  . Total CHOL/HDL Ratio 11/07/2013 4   Final                  Men          Women1/2 Average Risk     3.4          3.3Average Risk          5.0          4.42X Average Risk          9.6          7.13X Average Risk          15.0          11.0                      . Hemoglobin A1C 11/07/2013 6.9* 4.6 - 6.5 % Final   Glycemic Control Guidelines for People with Diabetes:Non Diabetic:  <6%Goal of Therapy: <7%Additional Action Suggested:  >8%   . Vit D, 25-Hydroxy 11/07/2013 67  30 - 89 ng/mL  Final   Comment: This assay accurately quantifies Vitamin D, which is the sum of the                          25-Hydroxy forms of Vitamin D2 and D3.  Studies have shown that the                          optimum concentration of 25-Hydroxy Vitamin D is 30 ng/mL or higher.                           Concentrations of Vitamin D between 20 and 29 ng/mL are considered to                          be insufficient and concentrations less than 20 ng/mL are considered                          to be deficient for Vitamin D.

## 2013-11-07 NOTE — Patient Instructions (Signed)
Please check blood sugars at least half the time about 2 hours after any meal and as directed on waking up. Please bring blood sugar monitor to each visit  Limit amounts of Carbs and Fats  Take Tums 2 daily

## 2013-11-08 LAB — MICROALBUMIN / CREATININE URINE RATIO
CREATININE, U: 41.6 mg/dL
Microalb Creat Ratio: 0.5 mg/g (ref 0.0–30.0)
Microalb, Ur: 0.2 mg/dL (ref 0.0–1.9)

## 2013-11-08 LAB — URINALYSIS, ROUTINE W REFLEX MICROSCOPIC
Bilirubin Urine: NEGATIVE
HGB URINE DIPSTICK: NEGATIVE
Ketones, ur: NEGATIVE
LEUKOCYTES UA: NEGATIVE
NITRITE: NEGATIVE
Specific Gravity, Urine: 1.01 (ref 1.000–1.030)
Total Protein, Urine: NEGATIVE
URINE GLUCOSE: NEGATIVE
Urobilinogen, UA: 0.2 (ref 0.0–1.0)
pH: 6 (ref 5.0–8.0)

## 2013-11-08 LAB — VITAMIN D 25 HYDROXY (VIT D DEFICIENCY, FRACTURES): Vit D, 25-Hydroxy: 67 ng/mL (ref 30–89)

## 2013-11-23 ENCOUNTER — Encounter: Payer: Self-pay | Admitting: Surgery

## 2013-11-26 ENCOUNTER — Ambulatory Visit (HOSPITAL_COMMUNITY)
Admission: RE | Admit: 2013-11-26 | Discharge: 2013-11-26 | Disposition: A | Discharge status: Home / Self Care | Payer: Medicare Other | Source: Ambulatory Visit | Attending: Surgery | Admitting: Surgery

## 2013-11-26 ENCOUNTER — Encounter: Payer: Self-pay | Admitting: Surgery

## 2013-11-26 ENCOUNTER — Ambulatory Visit (INDEPENDENT_AMBULATORY_CARE_PROVIDER_SITE_OTHER): Payer: Medicare Other | Admitting: Surgery

## 2013-11-26 ENCOUNTER — Ambulatory Visit (INDEPENDENT_AMBULATORY_CARE_PROVIDER_SITE_OTHER)
Admission: RE | Admit: 2013-11-26 | Discharge: 2013-11-26 | Disposition: A | Discharge status: Home / Self Care | Payer: Medicare Other | Source: Ambulatory Visit | Attending: Surgery | Admitting: Surgery

## 2013-11-26 VITALS — BP 120/48 | HR 55 | Ht 73.0 in | Wt 221.0 lb

## 2013-11-26 DIAGNOSIS — I6529 Occlusion and stenosis of unspecified carotid artery: Secondary | ICD-10-CM

## 2013-11-26 DIAGNOSIS — I739 Peripheral vascular disease, unspecified: Secondary | ICD-10-CM | POA: Diagnosis not present

## 2013-11-26 DIAGNOSIS — Z48812 Encounter for surgical aftercare following surgery on the circulatory system: Secondary | ICD-10-CM

## 2013-11-26 NOTE — Progress Notes (Signed)
   Patient name: Arlie Vanduyne MRN: [id number] DOB: 12/24/1945 Sex: male     Chief Complaint  Patient presents with  . Re-evaluation    6 month f/u     HISTORY OF PRESENT ILLNESS: The patient comes back today for followup. He is status post aortobifemoral bypass graft as well as left carotid endarterectomy in 2012.  He began complaining of leg swelling as well as prominent varicosities.  He recently underwent laser ablation of the right small saphenous and left great saphenous vein 4 swelling and pain.  He has done well from this procedure and has had significant improvement in his leg swelling.  He has no complaints today.  He ambulates without difficulty.  Continues to suffers from back pain   Past Medical History  Diagnosis Date  . DJD (degenerative joint disease)     L wrist  . Lumbar disc disease   . HTN (hypertension)   . Anemia     NOS  . Osteoporosis     tx per [facility], Dr Lyles, thought due to heavy steriod use after 1978  . Spine fracture     hx, multiple  . Blindness of left eye     near blindness. 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 GERD (gastroesophageal reflux disease)   . DM2 (diabetes mellitus, type 2)   . Anxiety   . Depression   . BPH (benign prostatic hypertrophy)   . HLD (hyperlipidemia)   . Chronic pain syndrome   . Chronic hyponatremia   . Diabetes mellitus   . Broken foot Oct. 12, 2013    Right foot Fx  . Varicose veins     Past Surgical History  Procedure Laterality Date  . Right hand fracture  1969  . Left ankle ganglion cyst  1976  . Lumbar disc surgery  1981, 2004  . Nasal sinus surgery      multiple- x8. last 1997 with obliteration  . Left cataract  1996    right - 1997  . Left foot surgery  1998    R surgery/fracture - 1999  . Left cts  2006    R CTS - 2007  . Left wrist/hand fusion  2008  . L foot open repair jones fracture  2010     5th metetarsal   . Rotator cuff repair  2006    R, than repeat 2011, Dr Collins  . Vocal surgery  1996  . Repair  thoracic aorta      History   Social History  . Marital Status: Divorced    Spouse Name: N/A    Number of Children: N/A  . Years of Education: N/A   Occupational History  . Not on file.   Social History Main Topics  . Smoking status: Former Smoker  . Smokeless tobacco: Never Used  . Alcohol Use: No  . Drug Use: No  . Sexual Activity: Not on file   Other Topics Concern  . Not on file   Social History Narrative   Divorced. 2 adult sons - 1 with DM.    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     Family History  Problem Relation Age of Onset  . Lung cancer Father     and grandmother  . Throat cancer      family hx - "bad living" - also lung CA, heart disease and renal failure    . Diabetes Mother     DM, also sister and MGF       Allergies as of 11/26/2013 - Review Complete 11/26/2013  Allergen Reaction Noted  . Ivp dye [iodinated diagnostic agents]  09/21/2010  . Penicillins  03/13/2010  . Propoxyphene n-acetaminophen Other (See Comments)   . Sulfonamide derivatives Hives and Itching     Current Outpatient Prescriptions on File Prior to Visit  Medication Sig Dispense Refill  . BAYER BREEZE 2 TEST DISK USE TO TEST BLOOD GLUCOSE THREE TIMES DAILY  100 each  11  . Blood Glucose Monitoring Suppl (BAYER BREEZE 2 SYSTEM) W/DEVICE KIT Use to check blood sugars once a day  1 kit  0  . buPROPion (WELLBUTRIN XL) 300 MG 24 hr tablet Take 300 mg by mouth every morning.       . celecoxib (CELEBREX) 100 MG capsule Take 200 mg by mouth 2 (two) times daily as needed. For pain      . Cholecalciferol (VITAMIN D) 1000 UNITS capsule Take 6,000 Units by mouth daily.       . clonazePAM (KLONOPIN) 1 MG tablet Take 1 mg by mouth 3 (three) times daily.       . enalapril (VASOTEC) 5 MG tablet TAKE 1 TABLET BY MOUTH DAILY  30 tablet  5  . fentaNYL (DURAGESIC - DOSED MCG/HR) 75 MCG/HR Place 1 patch onto the skin every 3 (three) days. For pain      . fludrocortisone  (FLORINEF) 0.1 MG tablet Take 1 tablet every other day  15 tablet  5  . glimepiride (AMARYL) 1 MG tablet Take 1 mg by mouth daily as needed.       . MICROLET LANCETS MISC       . omeprazole (PRILOSEC) 40 MG capsule Take 40 mg by mouth 2 (two) times daily.       . oral electrolytes (THERMOTABS) TABS tablet Take 6 tablets by mouth.      . oxyCODONE (ROXICODONE) 15 MG immediate release tablet continuous as needed.      . sertraline (ZOLOFT) 100 MG tablet Take 150 mg by mouth daily.       . sitaGLIPtin (JANUVIA) 100 MG tablet Take 1 tablet (100 mg total) by mouth daily.  30 tablet  5  . spironolactone (ALDACTONE) 25 MG tablet Take 25 mg by mouth daily.      . Tamsulosin HCl (FLOMAX) 0.4 MG CAPS Take 0.1 mg by mouth 2 (two) times daily.       . zonisamide (ZONEGRAN) 50 MG capsule Take 150 mg by mouth at bedtime.        . carvedilol (COREG) 6.25 MG tablet Take 6.25 mg by mouth daily.       . L-Methylfolate-B6-B12 (VITACIRC-B) 3-35-2 MG TABS Take 1 tablet by mouth 2 (two) times daily.       . Omega-3 Fatty Acids (FISH OIL PO) Take 1 tablet by mouth daily.      . SODIUM & POTASSIUM BICARBONATE PO Take 2 tablets by mouth 4 (four) times daily.       No current facility-administered medications on file prior to visit.     REVIEW OF SYSTEMS: Please see history of present illness, otherwise no changes from prior visit  PHYSICAL EXAMINATION:   Vital signs are BP 120/48  Pulse 55  Ht 6' 1" (1.854 m)  Wt 221 lb (100.245 kg)  BMI 29.16 kg/m2  SpO2 96% General: The patient appears their stated age. HEENT:  No gross abnormalities Pulmonary:  Non labored breathing Abdomen: Soft and non-tender.  There is no incisional hernia Musculoskeletal: There   are no major deformities. Neurologic: No focal weakness or paresthesias are detected, Skin: There are no ulcer or rashes noted. Psychiatric: The patient has normal affect. Cardiovascular: There is a regular rate and rhythm without significant murmur  appreciated.   Diagnostic Studies Carotid ultrasound was ordered and reviewed.  This shows a widely patent left carotid endarterectomy site.  The right side shows less than 40% stenosis.  ABI on the right is 1.0 with triphasic waveforms.  On the left is 1.0 with triphasic waveforms.  Assessment: #1: Peripheral vascular disease #2: Carotid stenosis Plan: #1: The patient does not have any further symptoms of claudication.  His bypass graft is widely patent.  I will check ankle-brachial indices in one year #2: The left carotid endarterectomy site is widely patent.  There is no significant right carotid stenosis.  He lab a carotid followup in one year  V. Leia Alf, M.D. [facility] Office: [phone] Pager:  [phone]

## 2013-11-27 ENCOUNTER — Encounter (HOSPITAL_COMMUNITY): Payer: Self-pay

## 2013-11-27 ENCOUNTER — Ambulatory Visit (HOSPITAL_COMMUNITY)
Admission: RE | Admit: 2013-11-27 | Discharge: 2013-11-27 | Disposition: A | Discharge status: Home / Self Care | Payer: Medicare Other | Source: Ambulatory Visit | Attending: Endocrinology | Admitting: Endocrinology

## 2013-11-27 ENCOUNTER — Other Ambulatory Visit (HOSPITAL_COMMUNITY): Payer: Self-pay | Admitting: Endocrinology

## 2013-11-27 MED ORDER — ZOLEDRONIC ACID 5 MG/100ML IV SOLN
5.0000 mg | Freq: Once | INTRAVENOUS | Status: AC
  Administered 2013-11-27: 5 mg via INTRAVENOUS
  Filled 2013-11-27: qty 100

## 2013-11-27 MED ORDER — SODIUM CHLORIDE 0.9 % IV SOLN
Freq: Once | INTRAVENOUS | Status: AC
  Administered 2013-11-27: 14:00:00 via INTRAVENOUS

## 2013-11-27 NOTE — Addendum Note (Signed)
Addended by: Dorthula Rue L on: 11/27/2013 03:38 PM   Modules accepted: Orders

## 2013-11-27 NOTE — Discharge Instructions (Signed)

## 2013-11-29 DIAGNOSIS — L89609 Pressure ulcer of unspecified heel, unspecified stage: Secondary | ICD-10-CM | POA: Diagnosis not present

## 2013-11-29 DIAGNOSIS — M7750 Other enthesopathy of unspecified foot: Secondary | ICD-10-CM | POA: Diagnosis not present

## 2013-11-30 ENCOUNTER — Telehealth: Payer: Self-pay | Admitting: Endocrinology

## 2013-11-30 NOTE — Telephone Encounter (Signed)
Please call [facility] for his [facility]otic [facility]   [phone] Di [facility]   Thank You

## 2013-12-04 DIAGNOSIS — G529 Cranial nerve disorder, unspecified: Secondary | ICD-10-CM | POA: Diagnosis not present

## 2013-12-06 ENCOUNTER — Other Ambulatory Visit: Payer: Self-pay | Admitting: Endocrinology

## 2013-12-07 NOTE — Telephone Encounter (Signed)
Please call [facility] for his orthotic [facility]

## 2013-12-07 NOTE — Telephone Encounter (Signed)
Please see below, have you filled out this form yet?

## 2013-12-10 NOTE — Telephone Encounter (Signed)
done

## 2013-12-11 ENCOUNTER — Encounter (HOSPITAL_BASED_OUTPATIENT_CLINIC_OR_DEPARTMENT_OTHER): Payer: Medicare Other | Attending: General Surgery

## 2013-12-24 DIAGNOSIS — E785 Hyperlipidemia, unspecified: Secondary | ICD-10-CM | POA: Diagnosis not present

## 2013-12-24 DIAGNOSIS — M810 Age-related osteoporosis without current pathological fracture: Secondary | ICD-10-CM | POA: Diagnosis not present

## 2013-12-24 DIAGNOSIS — I10 Essential (primary) hypertension: Secondary | ICD-10-CM | POA: Diagnosis not present

## 2013-12-24 DIAGNOSIS — Z23 Encounter for immunization: Secondary | ICD-10-CM | POA: Diagnosis not present

## 2013-12-31 DIAGNOSIS — M5137 Other intervertebral disc degeneration, lumbosacral region: Secondary | ICD-10-CM | POA: Diagnosis not present

## 2013-12-31 DIAGNOSIS — G529 Cranial nerve disorder, unspecified: Secondary | ICD-10-CM | POA: Diagnosis not present

## 2013-12-31 DIAGNOSIS — Z79899 Other long term (current) drug therapy: Secondary | ICD-10-CM | POA: Diagnosis not present

## 2013-12-31 DIAGNOSIS — G894 Chronic pain syndrome: Secondary | ICD-10-CM | POA: Diagnosis not present

## 2013-12-31 DIAGNOSIS — M79609 Pain in unspecified limb: Secondary | ICD-10-CM | POA: Diagnosis not present

## 2014-01-04 ENCOUNTER — Telehealth: Payer: Self-pay | Admitting: *Deleted

## 2014-01-04 NOTE — Telephone Encounter (Signed)
Patient called about his shoes, I let him know that we faxed over the correct rx for it, but that the company said they needed the detailed written order for them to complete it.  Patient is upset because it's taking so long.  He said he would "hate to have to find another physician so he can get his shoes".   I let him know you were out of the office until Tuesday but I would remind you when you came in.

## 2014-01-09 ENCOUNTER — Telehealth: Payer: Self-pay | Admitting: Endocrinology

## 2014-01-09 NOTE — Telephone Encounter (Signed)
Patient wanting a written order for the custom insoles to go in his diabetic shoes   Thank you :)

## 2014-01-09 NOTE — Telephone Encounter (Signed)
faxed

## 2014-01-09 NOTE — Telephone Encounter (Signed)
Prescription signed.

## 2014-01-09 NOTE — Telephone Encounter (Signed)
If he calls back please tell him it was already faxed to the company making his shoes,  He was informed last Friday about this.

## 2014-01-24 ENCOUNTER — Encounter: Payer: Self-pay | Admitting: Endocrinology

## 2014-01-24 ENCOUNTER — Telehealth: Payer: Self-pay | Admitting: *Deleted

## 2014-01-24 NOTE — Telephone Encounter (Signed)
I think the form was signed. Need new form if not recieved

## 2014-01-24 NOTE — Telephone Encounter (Signed)
Patient came to the office today, he needs an rx for CUSTOM shoe inserts for his diabetic shoes.  The last one you sent said inserts off the shelf.  Please advise

## 2014-02-13 ENCOUNTER — Other Ambulatory Visit: Payer: Self-pay | Admitting: Endocrinology

## 2014-02-28 DIAGNOSIS — M79609 Pain in unspecified limb: Secondary | ICD-10-CM | POA: Diagnosis not present

## 2014-02-28 DIAGNOSIS — Z79899 Other long term (current) drug therapy: Secondary | ICD-10-CM | POA: Diagnosis not present

## 2014-02-28 DIAGNOSIS — G529 Cranial nerve disorder, unspecified: Secondary | ICD-10-CM | POA: Diagnosis not present

## 2014-02-28 DIAGNOSIS — M5137 Other intervertebral disc degeneration, lumbosacral region: Secondary | ICD-10-CM | POA: Diagnosis not present

## 2014-02-28 DIAGNOSIS — G894 Chronic pain syndrome: Secondary | ICD-10-CM | POA: Diagnosis not present

## 2014-03-12 ENCOUNTER — Other Ambulatory Visit: Payer: Medicare Other

## 2014-03-12 ENCOUNTER — Other Ambulatory Visit: Payer: Self-pay | Admitting: Endocrinology

## 2014-03-12 DIAGNOSIS — M810 Age-related osteoporosis without current pathological fracture: Secondary | ICD-10-CM | POA: Diagnosis not present

## 2014-03-12 DIAGNOSIS — E871 Hypo-osmolality and hyponatremia: Secondary | ICD-10-CM | POA: Diagnosis not present

## 2014-03-12 DIAGNOSIS — E119 Type 2 diabetes mellitus without complications: Secondary | ICD-10-CM | POA: Diagnosis not present

## 2014-03-12 LAB — BASIC METABOLIC PANEL
BUN: 11 mg/dL (ref 6–23)
CHLORIDE: 90 meq/L — AB (ref 96–112)
CO2: 28 mEq/L (ref 19–32)
CREATININE: 0.77 mg/dL (ref 0.50–1.35)
Calcium: 8.6 mg/dL (ref 8.4–10.5)
GLUCOSE: 90 mg/dL (ref 70–99)
Potassium: 4.8 mEq/L (ref 3.5–5.3)
Sodium: 123 mEq/L — ABNORMAL LOW (ref 135–145)

## 2014-03-12 LAB — HEMOGLOBIN A1C
Hgb A1c MFr Bld: 6.8 % — ABNORMAL HIGH (ref <5.7)
Mean Plasma Glucose: 148 mg/dL — ABNORMAL HIGH (ref <117)

## 2014-03-13 LAB — OSMOLALITY, URINE: Osmolality, Ur: 248 mOsm/kg — ABNORMAL LOW (ref 390–1090)

## 2014-03-13 LAB — TSH: TSH: 2.698 u[IU]/mL (ref 0.350–4.500)

## 2014-03-13 LAB — MICROALBUMIN / CREATININE URINE RATIO
Creatinine, Urine: 35.9 mg/dL
MICROALB/CREAT RATIO: 13.9 mg/g (ref 0.0–30.0)
Microalb, Ur: 0.5 mg/dL (ref 0.00–1.89)

## 2014-03-15 ENCOUNTER — Ambulatory Visit (INDEPENDENT_AMBULATORY_CARE_PROVIDER_SITE_OTHER): Payer: Medicare Other | Admitting: Endocrinology

## 2014-03-15 VITALS — BP 120/62 | HR 63 | Temp 98.0°F | Resp 16 | Ht 73.0 in | Wt 222.4 lb

## 2014-03-15 DIAGNOSIS — R5381 Other malaise: Secondary | ICD-10-CM | POA: Diagnosis not present

## 2014-03-15 DIAGNOSIS — R5383 Other fatigue: Secondary | ICD-10-CM | POA: Diagnosis not present

## 2014-03-15 DIAGNOSIS — E1149 Type 2 diabetes mellitus with other diabetic neurological complication: Secondary | ICD-10-CM | POA: Diagnosis not present

## 2014-03-15 DIAGNOSIS — E871 Hypo-osmolality and hyponatremia: Secondary | ICD-10-CM | POA: Diagnosis not present

## 2014-03-15 DIAGNOSIS — R630 Anorexia: Secondary | ICD-10-CM | POA: Diagnosis not present

## 2014-03-15 DIAGNOSIS — I6529 Occlusion and stenosis of unspecified carotid artery: Secondary | ICD-10-CM

## 2014-03-15 LAB — CORTISOL
Cortisol, Plasma: 3.3 ug/dL
Cortisol, Plasma: 17.9 ug/dL

## 2014-03-15 MED ORDER — COSYNTROPIN 0.25 MG IJ SOLR
0.2500 mg | Freq: Once | INTRAMUSCULAR | Status: AC
  Administered 2014-03-15: 0.25 mg via INTRAMUSCULAR

## 2014-03-15 NOTE — Progress Notes (Signed)
Patient ID: [name], male   DOB: 03/04/1946, 67 y.o.   MRN: [id number]   Reason for Appointment: follow-up   History of Present Illness   1. HYPONATREMIA:  This is chronic and probably not related to SIADH with previous normal urine osmolality.  He had tried taking salt tablets previously without adequate results Previously he had benefited from adding fludrocortisone and is now on  half tablet daily since his last visit when sodium was 127. No edema with this. Although usually he is asymptomatic from the low sodium he thinks he gets full easily when eating and occasionally has nausea Also his sodium is down to 123 Urine osmolality is now 246 His fluid intake is probably about 6 glasses a day and is taking about 4 tablets of sodium daily   2. Type 2 DIABETES MELITUS  He has had relatively mild diabetes which is usually well controlled with Januvia alone.    Previously was taking 1 mg Amaryl as needed for high readings which would cause  hypoglycemia at times Currently not taking Amaryl  Difficult to assess his home control as he is checking only in the mornings again and not after meals His A1c is slightly improved at 6.8% the Has lost weight also this year.  Monitors blood glucose:  occasionally.    Glucometer: Breeze      Blood Glucose readings from recall: readings before breakfast: 90-128 mostly before meals   Hypoglycemia frequency:  none now      Meals: 1-2 meals per day.          Physical activity: exercise: Minimal, limited activity  Wt Readings from Last 3 Encounters:  03/15/14 222 lb 6.4 oz (100.88 kg)  11/26/13 221 lb (100.245 kg)  11/07/13 229 lb 3.2 oz (103.964 kg)    Lab Results  Component Value Date   HGBA1C 6.8* 03/12/2014   HGBA1C 6.9* 11/07/2013   HGBA1C 7.0* 07/30/2013   Lab Results  Component Value Date   MICROALBUR 0.50 03/12/2014   LDLCALC 70 11/07/2013   CREATININE 0.77 03/12/2014           Medication List       This list is accurate as  of: 03/15/14  1:45 PM.  Always use your most recent med list.               BAYER BREEZE 2 SYSTEM W/DEVICE Kit  Use to check blood sugars once a day     BAYER BREEZE 2 TEST Disk  Generic drug:  Glucose Blood  USE TO TEST BLOOD GLUCOSE THREE TIMES DAILY     buPROPion 300 MG 24 hr tablet  Commonly known as:  WELLBUTRIN XL  Take 300 mg by mouth every morning.     carvedilol 6.25 MG tablet  Commonly known as:  COREG  Take 6.25 mg by mouth daily.     celecoxib 100 MG capsule  Commonly known as:  CELEBREX  Take 200 mg by mouth 2 (two) times daily as needed. For pain     clonazePAM 1 MG tablet  Commonly known as:  KLONOPIN  Take 1 mg by mouth 3 (three) times daily.     enalapril 5 MG tablet  Commonly known as:  VASOTEC  TAKE 1 TABLET BY MOUTH EVERY DAY     fentaNYL 100 MCG/HR  Commonly known as:  DURAGESIC - dosed mcg/hr  Place 100 mcg onto the skin every 3 (three) days.     FISH OIL PO  Take 1   tablet by mouth daily.     FLOMAX 0.4 MG Caps capsule  Generic drug:  tamsulosin  Take 0.4 mg by mouth 2 (two) times daily.     fludrocortisone 0.1 MG tablet  Commonly known as:  FLORINEF  Take 1/2 tablet every day     glimepiride 1 MG tablet  Commonly known as:  AMARYL  Take 1 mg by mouth daily as needed.     JANUVIA 100 MG tablet  Generic drug:  sitaGLIPtin  TAKE 1 TABLET BY MOUTH DAILY     MICROLET LANCETS Misc     omeprazole 40 MG capsule  Commonly known as:  PRILOSEC  Take 40 mg by mouth 2 (two) times daily.     ORAL ELECTROLYTES PO  Take 6-8 tablets by mouth.     oxyCODONE 15 MG immediate release tablet  Commonly known as:  ROXICODONE  continuous as needed.     PROAIR HFA 108 (90 BASE) MCG/ACT inhaler  Generic drug:  albuterol     sertraline 100 MG tablet  Commonly known as:  ZOLOFT  Take 150 mg by mouth daily.     SODIUM & POTASSIUM BICARBONATE PO  Take 2 tablets by mouth 4 (four) times daily.     spironolactone 25 MG tablet  Commonly known as:   ALDACTONE  Take 25 mg by mouth daily.     VITACIRC-B 3-35-2 MG Tabs  Take 1 tablet by mouth 2 (two) times daily.     Vitamin D 1000 UNITS capsule  Take 6,000 Units by mouth daily.     zonisamide 50 MG capsule  Commonly known as:  ZONEGRAN  Take 150 mg by mouth at bedtime.        Allergies:  Allergies  Allergen Reactions  . Ivp Dye [Iodinated Diagnostic Agents]     Blood Pressure dropped----pt was pre-medicated with 13 hour prep and did fine with pre-meds--amy 03/09/11   . Penicillins     REACTION: gi upset  . Propoxyphene N-Acetaminophen Other (See Comments)    Sharp pains- headache  . Sulfonamide Derivatives Hives and Itching    Past Medical History  Diagnosis Date  . DJD (degenerative joint disease)     L wrist  . Lumbar disc disease   . HTN (hypertension)   . Anemia     NOS  . Osteoporosis     tx per [facility], Dr Prudencio Burly, thought due to heavy steriod use after 1978  . Spine fracture     hx, multiple  . Blindness of left eye     near blindness. 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 GERD (gastroesophageal reflux disease)   . DM2 (diabetes mellitus, type 2)   . Anxiety   . Depression   . BPH (benign prostatic hypertrophy)   . HLD (hyperlipidemia)   . Chronic pain syndrome   . Chronic hyponatremia   . Diabetes mellitus   . Broken foot Oct. 12, 2013    Right foot Fx  . Varicose veins     Past Surgical History  Procedure Laterality Date  . Right hand fracture  1969  . Left ankle ganglion cyst  1976  . Lumbar disc surgery  1981, 2004  . Nasal sinus surgery      multiple- x8. last 1997 with obliteration  . Left cataract  1996    right - 1997  . Left foot surgery  1998    R surgery/fracture - 1999  . Left cts  2006    R CTS -  2007  . Left wrist/hand fusion  2008  . L foot open repair jones fracture  2010     5th metetarsal   . Rotator cuff repair  2006    R, than repeat 2011, Dr Collins  . Vocal surgery  1996  . Repair thoracic aorta      Family History  Problem Relation  Age of Onset  . Lung cancer Father     and grandmother  . Throat cancer      family hx - "bad living" - also lung CA, heart disease and renal failure    . Diabetes Mother     DM, also sister and MGF     Social History:  reports that he has quit smoking. He has never used smokeless tobacco. He reports that he does not drink alcohol or use illicit drugs.  Review of Systems:  HYPERTENSION: He has had only mild hypertension.  He has been taking 5 mg Vasotec   Currently is taking Aldactone which was prescribed by cardiologist  Lipids: LDL has been below 100 and has not been on any lipid-lowering drugs  Lab Results  Component Value Date   CHOL 143 11/07/2013   HDL 38.50* 11/07/2013   LDLCALC 70 11/07/2013   LDLDIRECT 108.2 03/13/2010   TRIG 174.0* 11/07/2013   CHOLHDL 4 11/07/2013     History of chronic depression treated by psychiatrist, currently on 150 mg of Zoloft  History of osteoporosis getting Reclast annually, previous bone density showed T score -2.7 and more recently was 2.0 at the femoral neck, Has no history of secondary causes like hypogonadism and his testosterone level is again normal Last fracture 10/13, foot, traumatic Vitamin D3 has been supplemented, has been taking   5000 units a day   Calcium was low previously and he was asked to take Tums tablets    Neuropathy: He has numbness in his feet, long term. Has been given diabetic shoes. No infections recently Foot exam done in 3/15  He has history of chronic myofascial pain of unclear etiology. Besides fentanyl he is also taking Oxycodone 2-4 times a day because of severe pain      Examination:   BP 100/42  Pulse 63  Temp(Src) 98 F (36.7 C)  Resp 16  Ht 6' 1" (1.854 m)  Wt 222 lb 6.4 oz (100.88 kg)  BMI 29.35 kg/m2  SpO2 96%  Body mass index is 29.35 kg/(m^2).   His standing blood pressure = 120/62 done by myself No pedal edema   ASSESSMENT/ PLAN:   HYPONATREMIA: He is on low dose Florinef without  any edema.  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    Counseling time over 50% of today's 25 minute visit  , 03/15/2014, 1:45 PM   Addendum: Labs as follows. To increase Florinef to 1 tablet alternating with half   Orders Only on 03/12/2014  Component Date Value Ref Range Status  . Microalb, Ur 03/12/2014 0.50  0.00 - 1.89 mg/dL Final  . Creatinine, Urine 03/12/2014 35.9   Final  . Microalb Creat Ratio 03/12/2014 13.9  0.0 - 30.0 mg/g Final  . Sodium 03/12/2014 123* 135 - 145 mEq/L Final  . Potassium 03/12/2014 4.8  3.5 - 5.3 mEq/L Final  . Chloride 03/12/2014 90* 96 - 112 mEq/L Final  . CO2 03/12/2014 28  19 - 32 mEq/L Final  . Glucose, Bld 03/12/2014 90  70 - 99 mg/dL Final  . BUN 03/12/2014 11  6 - 23 mg/dL Final  . Creat 03/12/2014 0.77  0.50 - 1.35 mg/dL Final  . Calcium 03/12/2014 8.6  8.4 - 10.5 mg/dL Final  . Osmolality, Ur 03/12/2014 248* 390 - 1090 mOsm/kg Final  . TSH 03/12/2014 2.698  0.350 - 4.500 uIU/mL Final  . Hemoglobin A1C 03/12/2014 6.8* <5.7 % Final   Comment:                                                                                                  According to the ADA Clinical Practice Recommendations for 2011, when                          HbA1c is used as a screening test:                                                       >=6.5%   Diagnostic of Diabetes Mellitus                                     (if abnormal result is confirmed)                                                     5.7-6.4%   Increased risk of developing Diabetes Mellitus                                                     References:Diagnosis and Classification of Diabetes Mellitus,Diabetes                          Care,2011,34(Suppl 1):S62-S69 and Standards of Medical Care in                                    Diabetes - 2011,Diabetes Care,2011,34 (Suppl 1):S11-S61.                             . Mean Plasma Glucose 03/12/2014 148* <117 mg/dL Final    

## 2014-03-15 NOTE — Patient Instructions (Addendum)
Reduce Zoloft to 100mg  daily  Avoid over 5 cups of water /fluids daily  More sugars after supper ( 2 hrs)

## 2014-03-17 ENCOUNTER — Encounter: Payer: Self-pay | Admitting: Endocrinology

## 2014-03-17 NOTE — Progress Notes (Signed)
Quick Note:  Cortisone test in the normal range, I recommend taking Florinef one tablet alternating with half for sodium ______

## 2014-04-11 ENCOUNTER — Other Ambulatory Visit: Payer: Medicare Other

## 2014-04-15 ENCOUNTER — Telehealth: Payer: Self-pay | Admitting: Endocrinology

## 2014-04-15 ENCOUNTER — Ambulatory Visit: Payer: Medicare Other | Admitting: Endocrinology

## 2014-04-15 DIAGNOSIS — Z0289 Encounter for other administrative examinations: Secondary | ICD-10-CM

## 2014-04-15 NOTE — Telephone Encounter (Signed)
Option D., first available followup appointment

## 2014-04-15 NOTE — Telephone Encounter (Signed)
Patient no showed today's appt. Please advise on how to follow up. °A. No follow up necessary. °B. Follow up urgent. Contact patient immediately. °C. Follow up necessary. Contact patient and schedule visit in ___ days. °D. Follow up advised. Contact patient and schedule visit in ____weeks. ° °

## 2014-04-18 NOTE — Telephone Encounter (Signed)
LM for pt to call back to schedule appt.

## 2014-04-29 DIAGNOSIS — G894 Chronic pain syndrome: Secondary | ICD-10-CM | POA: Diagnosis not present

## 2014-04-29 DIAGNOSIS — G529 Cranial nerve disorder, unspecified: Secondary | ICD-10-CM | POA: Diagnosis not present

## 2014-04-29 DIAGNOSIS — M5137 Other intervertebral disc degeneration, lumbosacral region: Secondary | ICD-10-CM | POA: Diagnosis not present

## 2014-04-29 DIAGNOSIS — M79609 Pain in unspecified limb: Secondary | ICD-10-CM | POA: Diagnosis not present

## 2014-04-29 DIAGNOSIS — Z79899 Other long term (current) drug therapy: Secondary | ICD-10-CM | POA: Diagnosis not present

## 2014-05-02 NOTE — Telephone Encounter (Signed)
LM for call back to schedule appt. °

## 2014-05-15 DIAGNOSIS — R51 Headache: Secondary | ICD-10-CM | POA: Diagnosis not present

## 2014-05-15 DIAGNOSIS — J329 Chronic sinusitis, unspecified: Secondary | ICD-10-CM | POA: Diagnosis not present

## 2014-05-16 ENCOUNTER — Ambulatory Visit
Admission: RE | Admit: 2014-05-16 | Discharge: 2014-05-16 | Disposition: A | Discharge status: Home / Self Care | Payer: Medicare Other | Source: Ambulatory Visit | Attending: Family Medicine | Admitting: Family Medicine

## 2014-05-16 ENCOUNTER — Other Ambulatory Visit: Payer: Self-pay | Admitting: Family Medicine

## 2014-05-16 DIAGNOSIS — R2681 Unsteadiness on feet: Secondary | ICD-10-CM | POA: Diagnosis not present

## 2014-05-16 DIAGNOSIS — R51 Headache: Principal | ICD-10-CM

## 2014-05-16 DIAGNOSIS — R519 Headache, unspecified: Secondary | ICD-10-CM

## 2014-05-25 ENCOUNTER — Other Ambulatory Visit: Payer: Self-pay | Admitting: Endocrinology

## 2014-06-19 ENCOUNTER — Other Ambulatory Visit: Payer: Self-pay | Admitting: Endocrinology

## 2014-06-20 DIAGNOSIS — M5137 Other intervertebral disc degeneration, lumbosacral region: Secondary | ICD-10-CM | POA: Diagnosis not present

## 2014-06-20 DIAGNOSIS — Z79891 Long term (current) use of opiate analgesic: Secondary | ICD-10-CM | POA: Diagnosis not present

## 2014-06-20 DIAGNOSIS — G53 Cranial nerve disorders in diseases classified elsewhere: Secondary | ICD-10-CM | POA: Diagnosis not present

## 2014-07-03 ENCOUNTER — Other Ambulatory Visit: Payer: Self-pay | Admitting: Endocrinology

## 2014-07-03 ENCOUNTER — Other Ambulatory Visit: Payer: Self-pay | Admitting: *Deleted

## 2014-07-03 DIAGNOSIS — E119 Type 2 diabetes mellitus without complications: Secondary | ICD-10-CM

## 2014-07-03 LAB — T4, FREE: Free T4: 0.97 ng/dL (ref 0.80–1.80)

## 2014-07-04 LAB — BASIC METABOLIC PANEL
BUN: 8 mg/dL (ref 6–23)
CHLORIDE: 92 meq/L — AB (ref 96–112)
CO2: 27 meq/L (ref 19–32)
CREATININE: 0.77 mg/dL (ref 0.50–1.35)
Calcium: 8.8 mg/dL (ref 8.4–10.5)
Glucose, Bld: 72 mg/dL (ref 70–99)
Potassium: 4.5 mEq/L (ref 3.5–5.3)
Sodium: 126 mEq/L — ABNORMAL LOW (ref 135–145)

## 2014-07-04 LAB — HEMOGLOBIN A1C
HEMOGLOBIN A1C: 6.6 % — AB (ref <5.7)
Mean Plasma Glucose: 143 mg/dL — ABNORMAL HIGH (ref <117)

## 2014-07-08 ENCOUNTER — Ambulatory Visit (INDEPENDENT_AMBULATORY_CARE_PROVIDER_SITE_OTHER): Payer: Medicare Other | Admitting: Endocrinology

## 2014-07-08 ENCOUNTER — Encounter: Payer: Self-pay | Admitting: Endocrinology

## 2014-07-08 VITALS — BP 130/68 | HR 75 | Temp 98.4°F | Resp 14 | Ht 73.0 in | Wt 222.0 lb

## 2014-07-08 DIAGNOSIS — E119 Type 2 diabetes mellitus without complications: Secondary | ICD-10-CM | POA: Diagnosis not present

## 2014-07-08 DIAGNOSIS — I6529 Occlusion and stenosis of unspecified carotid artery: Secondary | ICD-10-CM

## 2014-07-08 DIAGNOSIS — E871 Hypo-osmolality and hyponatremia: Secondary | ICD-10-CM | POA: Diagnosis not present

## 2014-07-08 DIAGNOSIS — R5382 Chronic fatigue, unspecified: Secondary | ICD-10-CM | POA: Diagnosis not present

## 2014-07-08 NOTE — Progress Notes (Signed)
Patient ID: [name], male   DOB: September 03, 1945, 68 y.o.   MRN: [id number]   Reason for Appointment: follow-up   History of Present Illness   1. HYPONATREMIA:  This is chronic and of unclear etiology He thinks that the lowest sodium was diagnosed even before he was on chronic narcotic medications or Zoloft However he tends to have a urine osmolality about the same as his serum osmolality He has been evaluated with thyroid functions and ACTH stim relation test which did not show any definite abnormality  He had tried taking salt tablets previously without adequate benefit Previously he had benefited from adding fludrocortisone and because of his sodium going down to 123 was told to increase the dose from  half tablet daily to 1 tablet alternating with a half However because of fear of edema he did not do this and stopped Florinef completely He says he is taking about 8-10 tablets of sodium daily since his last visit when sodium was 123 His sodium is only slightly better at 126 He does complain of fatigue but no nausea or weight loss recently Urine osmolality last was  248 His fluid intake is probably about 8 glasses a day and is not restricting this  2. Type 2 DIABETES MELITUS  He has had relatively mild diabetes which is usually well controlled with Januvia alone.    Previously was taking 1 mg Amaryl as needed for high readings which would cause  hypoglycemia at times Currently not taking Amaryl  Difficult to assess his home control as he is checking only in the mornings again and not after meals His A1c is slightly improved at 6.6%  Monitors blood glucose:  mostly once a day in the morning .    Glucometer: Breeze      Blood Glucose readings from review of monitor: readings before breakfast: 88-131  Hypoglycemia frequency:  none       Meals: 1-2 meals per day.          Physical activity: exercise: Minimal, limited activity  Wt Readings from Last 3 Encounters:  07/08/14 222 lb  (100.699 kg)  03/15/14 222 lb 6.4 oz (100.88 kg)  11/26/13 221 lb (100.245 kg)    Lab Results  Component Value Date   HGBA1C 6.6* 07/03/2014   HGBA1C 6.8* 03/12/2014   HGBA1C 6.9* 11/07/2013   Lab Results  Component Value Date   MICROALBUR 0.50 03/12/2014   LDLCALC 70 11/07/2013   CREATININE 0.77 07/03/2014           Medication List       This list is accurate as of: 07/08/14  3:47 PM.  Always use your most recent med list.               BAYER BREEZE 2 SYSTEM W/DEVICE Kit  Use to check blood sugars once a day     BAYER BREEZE 2 TEST Disk  Generic drug:  Glucose Blood  USE TO TEST BLOOD GLUCOSE THREE TIMES DAILY     buPROPion 300 MG 24 hr tablet  Commonly known as:  WELLBUTRIN XL  Take 300 mg by mouth every morning.     celecoxib 200 MG capsule  Commonly known as:  CELEBREX  Take 200 mg by mouth 2 (two) times daily.     clonazePAM 1 MG tablet  Commonly known as:  KLONOPIN  Take 1 mg by mouth 3 (three) times daily.     cyclobenzaprine 10 MG tablet  Commonly known as:  FLEXERIL  fentaNYL 100 MCG/HR  Commonly known as:  DURAGESIC - dosed mcg/hr  Place 100 mcg onto the skin every 3 (three) days.     FLOMAX 0.4 MG Caps capsule  Generic drug:  tamsulosin  Take 0.4 mg by mouth 2 (two) times daily.     fludrocortisone 0.1 MG tablet  Commonly known as:  FLORINEF  Take 1/2 tablet every day     glimepiride 1 MG tablet  Commonly known as:  AMARYL  Take 1 mg by mouth daily as needed.     JANUVIA 100 MG tablet  Generic drug:  sitaGLIPtin  TAKE 1 TABLET BY MOUTH DAILY     losartan 50 MG tablet  Commonly known as:  COZAAR     MICROLET LANCETS Misc     omeprazole 40 MG capsule  Commonly known as:  PRILOSEC  Take 40 mg by mouth 2 (two) times daily.     ORAL ELECTROLYTES PO  Take 6-8 tablets by mouth.     oxyCODONE 15 MG immediate release tablet  Commonly known as:  ROXICODONE  continuous as needed.     PROAIR HFA 108 (90 BASE) MCG/ACT inhaler   Generic drug:  albuterol     sertraline 100 MG tablet  Commonly known as:  ZOLOFT  Take 150 mg by mouth daily.     SODIUM & POTASSIUM BICARBONATE PO  Take 2 tablets by mouth 4 (four) times daily.     spironolactone 25 MG tablet  Commonly known as:  ALDACTONE  Take 25 mg by mouth as needed.     VITACIRC-B 3-35-2 MG Tabs  Take 1 tablet by mouth 2 (two) times daily.     Vitamin D 1000 UNITS capsule  Take 6,000 Units by mouth daily.     zonisamide 50 MG capsule  Commonly known as:  ZONEGRAN  Take 150 mg by mouth at bedtime.        Allergies:  Allergies  Allergen Reactions  . Ivp Dye [Iodinated Diagnostic Agents]     Blood Pressure dropped----pt was pre-medicated with 13 hour prep and did fine with pre-meds--amy 03/09/11   . Penicillins     REACTION: gi upset  . Propoxyphene N-Acetaminophen Other (See Comments)    Sharp pains- headache  . Sulfonamide Derivatives Hives and Itching    Past Medical History  Diagnosis Date  . DJD (degenerative joint disease)     L wrist  . Lumbar disc disease   . HTN (hypertension)   . Anemia     NOS  . Osteoporosis     tx per [facility], Dr Prudencio Burly, thought due to heavy steriod use after 1978  . Spine fracture     hx, multiple  . Blindness of left eye     near blindness. 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 GERD (gastroesophageal reflux disease)   . DM2 (diabetes mellitus, type 2)   . Anxiety   . Depression   . BPH (benign prostatic hypertrophy)   . HLD (hyperlipidemia)   . Chronic pain syndrome   . Chronic hyponatremia   . Diabetes mellitus   . Broken foot Oct. 12, 2013    Right foot Fx  . Varicose veins     Past Surgical History  Procedure Laterality Date  . Right hand fracture  1969  . Left ankle ganglion cyst  1976  . Lumbar disc surgery  1981, 2004  . Nasal sinus surgery      multiple- x8. last 1997 with obliteration  . Left cataract  1996    right - 1997  . Left foot surgery  1998    R surgery/fracture - 1999  . Left cts  2006    R  CTS - 2007  . Left wrist/hand fusion  2008  . L foot open repair jones fracture  2010     5th metetarsal   . Rotator cuff repair  2006    R, than repeat 2011, Dr Theda Sers  . Vocal surgery  1996  . Repair thoracic aorta      Family History  Problem Relation Age of Onset  . Lung cancer Father     and grandmother  . Throat cancer      family hx - "bad living" - also lung CA, heart disease and renal failure    . Diabetes Mother     DM, also sister and MGF     Social History:  reports that he has quit smoking. He has never used smokeless tobacco. He reports that he does not drink alcohol or use illicit drugs.  Review of Systems:  HYPERTENSION: He has had only mild hypertension.  He has been off 5 mg Vasotec because of cough , on Losartan now from PCP   Currently is taking Aldactone  only occasionally which was prescribed by cardiologist  Lipids: LDL has been below 100 and has not been on any lipid-lowering drugs  Lab Results  Component Value Date   CHOL 143 11/07/2013   HDL 38.50* 11/07/2013   LDLCALC 70 11/07/2013   LDLDIRECT 108.2 03/13/2010   TRIG 174.0* 11/07/2013   CHOLHDL 4 11/07/2013   No history of thyroid disease:  Lab Results  Component Value Date   FREET4 0.97 07/03/2014   TSH 2.698 03/12/2014   TSH 2.63 07/30/2013   TSH 1.43 10/17/2009    History of chronic depression treated by psychiatrist, currently on 150 mg of Zoloft  History of osteoporosis getting Reclast annually with the last infusion in 11/2013 The prior bone density showed T score -2.7 and more recently was 2.0 at the femoral neck, Has no history of secondary causes like hypogonadism and his testosterone level is again normal Last fracture 10/13, foot, traumatic Vitamin D3 has been supplemented, has been taking   5000 units a day   Calcium was low previously and he was asked to take Tums tablets    Neuropathy: He has numbness in his feet, long term. Has been given diabetic shoes. No infections  recently Foot exam done in 3/15  He has history of chronic myofascial pain of unclear etiology. Besides fentanyl he is also taking Oxycodone 33m, 2-4 times a day because of severe pain      Examination:   BP 130/68 mmHg  Pulse 75  Temp(Src) 98.4 F (36.9 C)  Resp 14  Ht 6' 1"  (1.854 m)  Wt 222 lb (100.699 kg)  BMI 29.30 kg/m2  SpO2 94%  Body mass index is 29.3 kg/(m^2).   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 his narcotic and sertraline use  Since he had previously improved hyponatremia with Florinef discussed that we  need to  try this again as below Will have his sodium rechecked in about a month He also should restrict his fluids somewhat because he is drinking at least 8 glasses a day Tends to have fatigue as his main symptom and occasional nausea  Diabetes type 2   The patient's diabetes control appears to be  Under good control with home readings mostly in the normal range but does not have  readings after meals He has difficulty exercising because of his multiple medical issues He is doing fairly well with Januvia alone and can continue Reminded him to check more readings after meals and discussed blood sugar targets  HYPERTENSION: He can continue losartan prescribed by PCP recently Do not think he needs to take Aldactone especially when he is taking losartan and has some hyponatremia  Osteoporosis: Currently asymptomatic.  Discussed that he will need another infusion of Reclast in April Calcium is improved and he can continue vitamin D plus calcium and calcium level to be checked periodically  Patient Instructions   Reduce fluid intake by 1/3  Take Florinef every 2 days        [facility] 07/08/2014, 3:47 PM   Labs:  Orders Only on 07/03/2014  Component Date Value Ref Range Status  . Sodium 07/03/2014 126* 135 - 145 mEq/L Final  . Potassium 07/03/2014 4.5  3.5 - 5.3 mEq/L Final  . Chloride 07/03/2014 92* 96 - 112 mEq/L Final  . CO2 07/03/2014 27  19 - 32 mEq/L Final  . Glucose, Bld 07/03/2014 72  70 - 99 mg/dL Final  . BUN 07/03/2014 8  6 - 23 mg/dL Final  . Creat 07/03/2014 0.77  0.50 - 1.35 mg/dL Final  . Calcium 07/03/2014 8.8  8.4 - 10.5 mg/dL Final  . Free T4 07/03/2014 0.97  0.80 - 1.80 ng/dL Final  . Hgb A1c MFr Bld 07/03/2014 6.6* <5.7 % Final   Comment:                                                                        According to the ADA Clinical Practice Recommendations for 2011, when HbA1c is used as a screening test:     >=6.5%   Diagnostic of Diabetes Mellitus            (if abnormal result is confirmed)   5.7-6.4%   Increased risk of developing Diabetes Mellitus   References:Diagnosis and Classification of Diabetes Mellitus,Diabetes OHFG,9021,11(BZMCE 1):S62-S69 and Standards of Medical Care in         Diabetes - 2011,Diabetes Care,2011,34 (Suppl 1):S11-S61.     . Mean Plasma Glucose 07/03/2014 143* <117 mg/dL Final

## 2014-07-08 NOTE — Patient Instructions (Signed)
Reduce fluid intake by 1/3  Take Florinef every 2 days

## 2014-07-09 DIAGNOSIS — Z23 Encounter for immunization: Secondary | ICD-10-CM | POA: Diagnosis not present

## 2014-07-09 DIAGNOSIS — I10 Essential (primary) hypertension: Secondary | ICD-10-CM | POA: Diagnosis not present

## 2014-07-09 DIAGNOSIS — M62838 Other muscle spasm: Secondary | ICD-10-CM | POA: Diagnosis not present

## 2014-08-14 ENCOUNTER — Other Ambulatory Visit (INDEPENDENT_AMBULATORY_CARE_PROVIDER_SITE_OTHER): Payer: Medicare Other

## 2014-08-14 DIAGNOSIS — E871 Hypo-osmolality and hyponatremia: Secondary | ICD-10-CM

## 2014-08-14 LAB — BASIC METABOLIC PANEL
BUN: 14 mg/dL (ref 6–23)
CO2: 27 mEq/L (ref 19–32)
Calcium: 8.9 mg/dL (ref 8.4–10.5)
Chloride: 94 mEq/L — ABNORMAL LOW (ref 96–112)
Creatinine, Ser: 0.7 mg/dL (ref 0.4–1.5)
GFR: 115.27 mL/min (ref >60.00)
GLUCOSE: 80 mg/dL (ref 70–99)
Potassium: 4.6 mEq/L (ref 3.5–5.1)
SODIUM: 128 meq/L — AB (ref 135–145)

## 2014-08-14 LAB — T4, FREE: Free T4: 0.73 ng/dL (ref 0.60–1.60)

## 2014-08-19 ENCOUNTER — Encounter: Payer: Self-pay | Admitting: Endocrinology

## 2014-08-19 ENCOUNTER — Ambulatory Visit (INDEPENDENT_AMBULATORY_CARE_PROVIDER_SITE_OTHER): Payer: Medicare Other | Admitting: Endocrinology

## 2014-08-19 VITALS — BP 98/60 | HR 72 | Temp 98.5°F | Resp 14 | Ht 73.0 in | Wt 218.6 lb

## 2014-08-19 DIAGNOSIS — R5382 Chronic fatigue, unspecified: Secondary | ICD-10-CM

## 2014-08-19 DIAGNOSIS — I952 Hypotension due to drugs: Secondary | ICD-10-CM

## 2014-08-19 DIAGNOSIS — M5137 Other intervertebral disc degeneration, lumbosacral region: Secondary | ICD-10-CM | POA: Diagnosis not present

## 2014-08-19 DIAGNOSIS — E119 Type 2 diabetes mellitus without complications: Secondary | ICD-10-CM

## 2014-08-19 DIAGNOSIS — M810 Age-related osteoporosis without current pathological fracture: Secondary | ICD-10-CM

## 2014-08-19 DIAGNOSIS — Z79891 Long term (current) use of opiate analgesic: Secondary | ICD-10-CM | POA: Diagnosis not present

## 2014-08-19 DIAGNOSIS — H5711 Ocular pain, right eye: Secondary | ICD-10-CM | POA: Diagnosis not present

## 2014-08-19 DIAGNOSIS — G894 Chronic pain syndrome: Secondary | ICD-10-CM | POA: Diagnosis not present

## 2014-08-19 MED ORDER — FLUDROCORTISONE ACETATE 0.1 MG PO TABS: ORAL_TABLET | ORAL | Status: DC

## 2014-08-19 NOTE — Patient Instructions (Addendum)
Florinef daily until dizziness goes away  STOP LOSARTAN

## 2014-08-19 NOTE — Progress Notes (Signed)
Patient ID: [name], male   DOB: 06/17/1946, 69 y.o.   MRN: [id number]   Reason for Appointment: follow-up   History of Present Illness   1. HYPONATREMIA:  This is chronic and of unclear etiology He thinks that the condition was diagnosed even before he was on chronic narcotic medications or Zoloft Evaluation has usually showed a urine osmolality about the same as his serum osmolality He has been evaluated with thyroid functions and ACTH stim relation test which did not show any definite abnormality  He had tried taking salt tablets previously without adequate benefit Also on his last visit he was noted to be drinking about 8 glasses a day and is not restricting fluids He has been somewhat irregular with compliance with Florinef which has been prescribed before Sodium has been as low as 123 in 2015; however his not very symptomatic even at this point  He was told to start back on his Florinef on his last visit in 11/15 when his sodium was 126 He is currently taking this every other day and he thinks he has been compliant with this Sometimes will take Aldactone also for fear of edema He does complain of fatigue but no nausea.  Has lost a little weight Urine osmolality last was  248  2. Type 2 DIABETES MELITUS  He has had relatively mild diabetes which is usually well controlled with Januvia alone.    Previously was taking 1 mg Amaryl as needed for high readings which would cause  hypoglycemia at times Currently not taking Amaryl  Difficult to assess his home control as he is checking only in the mornings again and not after meals His A1c is slightly improved at 6.6% in 11/15  Monitors blood glucose:  less than every  day .    Glucometer: Breeze      Blood Glucose readings from review of monitor: readings before breakfast: 81-136, average 105, checking blood sugar mostly in the morning  Hypoglycemia frequency:  none       Meals: 1-2 meals per day.          Physical activity:  exercise: Minimal, limited activity  Wt Readings from Last 3 Encounters:  08/19/14 218 lb 9.6 oz (99.156 kg)  07/08/14 222 lb (100.699 kg)  03/15/14 222 lb 6.4 oz (100.88 kg)    Lab Results  Component Value Date   HGBA1C 6.6* 07/03/2014   HGBA1C 6.8* 03/12/2014   HGBA1C 6.9* 11/07/2013   Lab Results  Component Value Date   MICROALBUR 0.50 03/12/2014   LDLCALC 70 11/07/2013   CREATININE 0.7 08/14/2014           Medication List       This list is accurate as of: 08/19/14  5:01 PM.  Always use your most recent med list.               BAYER BREEZE 2 SYSTEM W/DEVICE Kit  Use to check blood sugars once a day     BAYER BREEZE 2 TEST Disk  Generic drug:  Glucose Blood  USE TO TEST BLOOD GLUCOSE THREE TIMES DAILY     buPROPion 300 MG 24 hr tablet  Commonly known as:  WELLBUTRIN XL  Take 300 mg by mouth every morning.     celecoxib 200 MG capsule  Commonly known as:  CELEBREX  Take 200 mg by mouth 2 (two) times daily.     clonazePAM 1 MG tablet  Commonly known as:  KLONOPIN  Take 1 mg  by mouth 3 (three) times daily.     cyclobenzaprine 10 MG tablet  Commonly known as:  FLEXERIL     fentaNYL 100 MCG/HR  Commonly known as:  DURAGESIC - dosed mcg/hr  Place 100 mcg onto the skin every 3 (three) days.     FLOMAX 0.4 MG Caps capsule  Generic drug:  tamsulosin  Take 0.4 mg by mouth 2 (two) times daily.     fludrocortisone 0.1 MG tablet  Commonly known as:  FLORINEF  Take 1 tablet every day     JANUVIA 100 MG tablet  Generic drug:  sitaGLIPtin  TAKE 1 TABLET BY MOUTH DAILY     losartan 50 MG tablet  Commonly known as:  COZAAR     MICROLET LANCETS Misc     omeprazole 40 MG capsule  Commonly known as:  PRILOSEC  Take 40 mg by mouth 2 (two) times daily.     ORAL ELECTROLYTES PO  Take 6-8 tablets by mouth.     oxyCODONE 15 MG immediate release tablet  Commonly known as:  ROXICODONE  continuous as needed.     PROAIR HFA 108 (90 BASE) MCG/ACT inhaler   Generic drug:  albuterol     sertraline 100 MG tablet  Commonly known as:  ZOLOFT  Take 150 mg by mouth daily.     SODIUM & POTASSIUM BICARBONATE PO  Take 2 tablets by mouth 4 (four) times daily.     spironolactone 25 MG tablet  Commonly known as:  ALDACTONE  Take 25 mg by mouth as needed.     VITACIRC-B 3-35-2 MG Tabs  Take 1 tablet by mouth 2 (two) times daily.     Vitamin D 1000 UNITS capsule  Take 6,000 Units by mouth daily.     zonisamide 50 MG capsule  Commonly known as:  ZONEGRAN  Take 150 mg by mouth at bedtime.        Allergies:  Allergies  Allergen Reactions  . Ivp Dye [Iodinated Diagnostic Agents]     Blood Pressure dropped----pt was pre-medicated with 13 hour prep and did fine with pre-meds--amy 03/09/11   . Penicillins     REACTION: gi upset  . Propoxyphene N-Acetaminophen Other (See Comments)    Sharp pains- headache  . Sulfonamide Derivatives Hives and Itching    Past Medical History  Diagnosis Date  . DJD (degenerative joint disease)     L wrist  . Lumbar disc disease   . HTN (hypertension)   . Anemia     NOS  . Osteoporosis     tx per [facility], Dr Prudencio Burly, thought due to heavy steriod use after 1978  . Spine fracture     hx, multiple  . Blindness of left eye     near blindness. 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 GERD (gastroesophageal reflux disease)   . DM2 (diabetes mellitus, type 2)   . Anxiety   . Depression   . BPH (benign prostatic hypertrophy)   . HLD (hyperlipidemia)   . Chronic pain syndrome   . Chronic hyponatremia   . Diabetes mellitus   . Broken foot Oct. 12, 2013    Right foot Fx  . Varicose veins     Past Surgical History  Procedure Laterality Date  . Right hand fracture  1969  . Left ankle ganglion cyst  1976  . Lumbar disc surgery  1981, 2004  . Nasal sinus surgery      multiple- x8. last 1997 with obliteration  . Left  cataract  1996    right - 1997  . Left foot surgery  1998    R surgery/fracture - 1999  . Left cts  2006    R  CTS - 2007  . Left wrist/hand fusion  2008  . L foot open repair jones fracture  2010     5th metetarsal   . Rotator cuff repair  2006    R, than repeat 2011, Dr Theda Sers  . Vocal surgery  1996  . Repair thoracic aorta      Family History  Problem Relation Age of Onset  . Lung cancer Father     and grandmother  . Throat cancer      family hx - "bad living" - also lung CA, heart disease and renal failure    . Diabetes Mother     DM, also sister and MGF     Social History:  reports that he has quit smoking. He has never used smokeless tobacco. He reports that he does not drink alcohol or use illicit drugs.  Review of Systems:  HYPERTENSION: He has had only mild hypertension in the past.  He has been  on Losartan now from PCP, previously had tried Vasotec which probably caused cough   Currently is taking Aldactone  only rarely which was prescribed by cardiologist  He has been feeling lightheaded more recently.  Also he thinks that on Saturday he was feeling weak and his housekeeper found him leaning against a kitchen table.  He was better after sitting down and drinking some fluids  Lipids: LDL has been below 100 and has not been on any lipid-lowering drugs  Lab Results  Component Value Date   CHOL 143 11/07/2013   HDL 38.50* 11/07/2013   LDLCALC 70 11/07/2013   LDLDIRECT 108.2 03/13/2010   TRIG 174.0* 11/07/2013   CHOLHDL 4 11/07/2013   No history of thyroid disease:  Lab Results  Component Value Date   FREET4 0.73 08/14/2014   FREET4 0.97 07/03/2014   TSH 2.698 03/12/2014   TSH 2.63 07/30/2013   TSH 1.43 10/17/2009    History of chronic depression treated by psychiatrist, currently on 150 mg of Zoloft  History of osteoporosis getting Reclast annually with the last infusion in 11/2013 The prior bone density showed T score -2.7  Recent bone density in 12/15 showed normal T score at the spine, at the femoral neck was -1.7 on one side and -2.0 on the other  Has no  history of secondary causes like hypogonadism and his testosterone level is again normal Last fracture 10/13, foot, traumatic. He has had some falls recently without any fractures Vitamin D3 has been supplemented, has been taking   5000 units a day    Neuropathy: He has numbness in his feet, long term. Has been given diabetic shoes. No infections  Foot exam done in 3/15  He has history of chronic myofascial pain of unclear etiology.  On fentany and  is also taking Oxycodone 61m, 2-4 times a day because of severe pain      Examination:   BP 98/60 mmHg  Pulse 72  Temp(Src) 98.5 F (36.9 C)  Resp 14  Ht 6' 1"  (1.854 m)  Wt 218 lb 9.6 oz (99.156 kg)  BMI 28.85 kg/m2  SpO2 93%  Body mass index is 28.85 kg/(m^2).   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  HYPERTENSION: He appears to be orthostatic today and we will stop losartan Discussed that his episode of weakness/lightheadedness last week and may have been related to hypotension Also should not take Aldactone  Osteoporosis with history of fractures: Currently asymptomatic.  Bone density has  improved.  Discussed that he will need another infusion of Reclast in April Calcium level is still low normal he can continue vitamin D plus calcium and calcium level to be checked periodically  FATIGUE: Discussed that he does not have thyroid problems and his fatigue is likely multifactorial  Dyslipidemia with low HDL: We will request labs from PCP  Patient Instructions  Florinef daily until dizziness goes away  [place] 08/19/2014, 5:01 PM   Labs:  Appointment on 08/14/2014  Component Date Value Ref Range Status  . Sodium 08/14/2014 128* 135 - 145 mEq/L Final  . Potassium 08/14/2014 4.6  3.5 - 5.1 mEq/L Final  . Chloride 08/14/2014 94* 96 - 112 mEq/L Final  . CO2 08/14/2014 27  19 - 32 mEq/L Final  . Glucose, Bld 08/14/2014 80  70 - 99 mg/dL Final  . BUN 08/14/2014 14  6 - 23 mg/dL Final  . Creatinine, Ser 08/14/2014 0.7  0.4 - 1.5 mg/dL Final  . Calcium 08/14/2014 8.9  8.4 - 10.5 mg/dL Final  . GFR 08/14/2014 115.27  >60.00 mL/min Final  . Free T4 08/14/2014 0.73  0.60 - 1.60 ng/dL Final

## 2014-08-30 DIAGNOSIS — J329 Chronic sinusitis, unspecified: Secondary | ICD-10-CM | POA: Diagnosis not present

## 2014-09-25 ENCOUNTER — Telehealth: Payer: Self-pay | Admitting: Endocrinology

## 2014-09-25 ENCOUNTER — Other Ambulatory Visit: Payer: Self-pay | Admitting: *Deleted

## 2014-09-25 MED ORDER — SITAGLIPTIN PHOSPHATE 100 MG PO TABS: 100.0000 mg | ORAL_TABLET | Freq: Every day | ORAL | Status: DC

## 2014-09-25 NOTE — Telephone Encounter (Signed)
rx was sent this morning 

## 2014-09-25 NOTE — Telephone Encounter (Signed)
januvia 100 mg tabs needs additional refills

## 2014-10-17 DIAGNOSIS — M5137 Other intervertebral disc degeneration, lumbosacral region: Secondary | ICD-10-CM | POA: Diagnosis not present

## 2014-10-17 DIAGNOSIS — G894 Chronic pain syndrome: Secondary | ICD-10-CM | POA: Diagnosis not present

## 2014-10-17 DIAGNOSIS — H5711 Ocular pain, right eye: Secondary | ICD-10-CM | POA: Diagnosis not present

## 2014-10-17 DIAGNOSIS — Z79891 Long term (current) use of opiate analgesic: Secondary | ICD-10-CM | POA: Diagnosis not present

## 2014-11-07 ENCOUNTER — Encounter (HOSPITAL_BASED_OUTPATIENT_CLINIC_OR_DEPARTMENT_OTHER): Payer: Self-pay | Admitting: *Deleted

## 2014-11-07 ENCOUNTER — Other Ambulatory Visit: Payer: Self-pay | Admitting: Orthopedic Surgery

## 2014-11-07 ENCOUNTER — Other Ambulatory Visit: Payer: Self-pay

## 2014-11-07 ENCOUNTER — Encounter (HOSPITAL_BASED_OUTPATIENT_CLINIC_OR_DEPARTMENT_OTHER)
Admission: RE | Admit: 2014-11-07 | Discharge: 2014-11-07 | Disposition: A | Discharge status: Home / Self Care | Payer: Medicare Other | Source: Ambulatory Visit | Attending: Orthopedic Surgery | Admitting: Orthopedic Surgery

## 2014-11-07 DIAGNOSIS — M810 Age-related osteoporosis without current pathological fracture: Secondary | ICD-10-CM | POA: Diagnosis not present

## 2014-11-07 DIAGNOSIS — M24542 Contracture, left hand: Secondary | ICD-10-CM | POA: Diagnosis not present

## 2014-11-07 DIAGNOSIS — M65842 Other synovitis and tenosynovitis, left hand: Secondary | ICD-10-CM | POA: Diagnosis not present

## 2014-11-07 DIAGNOSIS — G894 Chronic pain syndrome: Secondary | ICD-10-CM | POA: Diagnosis not present

## 2014-11-07 DIAGNOSIS — M6530 Trigger finger, unspecified finger: Secondary | ICD-10-CM | POA: Insufficient documentation

## 2014-11-07 DIAGNOSIS — M65332 Trigger finger, left middle finger: Secondary | ICD-10-CM | POA: Diagnosis not present

## 2014-11-07 DIAGNOSIS — Z87891 Personal history of nicotine dependence: Secondary | ICD-10-CM | POA: Diagnosis not present

## 2014-11-07 DIAGNOSIS — H5442 Blindness, left eye, normal vision right eye: Secondary | ICD-10-CM | POA: Diagnosis not present

## 2014-11-07 DIAGNOSIS — N400 Enlarged prostate without lower urinary tract symptoms: Secondary | ICD-10-CM | POA: Diagnosis not present

## 2014-11-07 DIAGNOSIS — Z79899 Other long term (current) drug therapy: Secondary | ICD-10-CM | POA: Diagnosis not present

## 2014-11-07 DIAGNOSIS — K219 Gastro-esophageal reflux disease without esophagitis: Secondary | ICD-10-CM | POA: Diagnosis not present

## 2014-11-07 DIAGNOSIS — Z981 Arthrodesis status: Secondary | ICD-10-CM | POA: Diagnosis not present

## 2014-11-07 DIAGNOSIS — Z01810 Encounter for preprocedural cardiovascular examination: Secondary | ICD-10-CM | POA: Diagnosis not present

## 2014-11-07 DIAGNOSIS — I10 Essential (primary) hypertension: Secondary | ICD-10-CM | POA: Diagnosis not present

## 2014-11-07 DIAGNOSIS — E119 Type 2 diabetes mellitus without complications: Secondary | ICD-10-CM | POA: Diagnosis not present

## 2014-11-07 DIAGNOSIS — Z8673 Personal history of transient ischemic attack (TIA), and cerebral infarction without residual deficits: Secondary | ICD-10-CM | POA: Diagnosis not present

## 2014-11-07 DIAGNOSIS — M65841 Other synovitis and tenosynovitis, right hand: Secondary | ICD-10-CM | POA: Diagnosis not present

## 2014-11-07 DIAGNOSIS — Z9889 Other specified postprocedural states: Secondary | ICD-10-CM | POA: Diagnosis not present

## 2014-11-07 LAB — BASIC METABOLIC PANEL
Anion gap: 9 (ref 5–15)
BUN: 11 mg/dL (ref 6–23)
CALCIUM: 8.9 mg/dL (ref 8.4–10.5)
CO2: 25 mmol/L (ref 19–32)
CREATININE: 0.8 mg/dL (ref 0.50–1.35)
Chloride: 92 mmol/L — ABNORMAL LOW (ref 96–112)
GFR, EST NON AFRICAN AMERICAN: 90 mL/min — AB (ref >90)
Glucose, Bld: 72 mg/dL (ref 70–99)
Potassium: 4.8 mmol/L (ref 3.5–5.1)
SODIUM: 126 mmol/L — AB (ref 135–145)

## 2014-11-07 NOTE — H&P (Signed)
[name] is an 69 y.o. male.   CC / Reason for Visit: Bilateral long finger catching and locking with pain HPI: This patient returns for reevaluation, indicating that he continues to have problems with both long fingers and desires to proceed with surgical release on the left side.  It is the worse side.  It locks and catches, and is painful.  It bothers his dexterity.  Presenting history follows: This patient is a 69 year old male who presents for evaluation of problems involving both hands.  He reports that the locking and catching has been present for somewhere between 2 and 3 years.  In the past he has had a left partial wrist fusion in 2008 and a carpal tunnel release.  He is a diabetic he does not require canceling.  He reports that he does well with steroid injection.  Past Medical History  Diagnosis Date  . DJD (degenerative joint disease)     L wrist  . Lumbar disc disease   . HTN (hypertension)   . Anemia     NOS  . Osteoporosis     tx per [facility], Dr Prudencio Burly, thought due to heavy steriod use after 1978  . Spine fracture     hx, multiple  . Blindness of left eye     near blindness. 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 GERD (gastroesophageal reflux disease)   . DM2 (diabetes mellitus, type 2)   . Anxiety   . Depression   . BPH (benign prostatic hypertrophy)   . HLD (hyperlipidemia)   . Chronic pain syndrome   . Chronic hyponatremia   . Diabetes mellitus   . Broken foot Oct. 12, 2013    Right foot Fx  . Varicose veins     Past Surgical History  Procedure Laterality Date  . Right hand fracture  1969  . Left ankle ganglion cyst  1976  . Lumbar disc surgery  1981, 2004  . Nasal sinus surgery      multiple- x8. last 1997 with obliteration  . Left cataract  1996    right - 1997  . Left foot surgery  1998    R surgery/fracture - 1999  . Left cts  2006    R CTS - 2007  . Left wrist/hand fusion  2008  . L foot open repair jones fracture  2010     5th metetarsal   . Rotator cuff repair   2006    R, than repeat 2011, Dr Theda Sers  . Vocal surgery  1996  . Repair thoracic aorta      Family History  Problem Relation Age of Onset  . Lung cancer Father     and grandmother  . Throat cancer      family hx - "bad living" - also lung CA, heart disease and renal failure    . Diabetes Mother     DM, also sister and MGF    Social History:  reports that he quit smoking about 26 years ago. He has never used smokeless tobacco. He reports that he does not drink alcohol or use illicit drugs.  Allergies:  Allergies  Allergen Reactions  . Ivp Dye [Iodinated Diagnostic Agents]     Blood Pressure dropped----pt was pre-medicated with 13 hour prep and did fine with pre-meds--amy 03/09/11   . Penicillins     REACTION: gi upset  . Propoxyphene N-Acetaminophen Other (See Comments)    Sharp pains- headache  . Sulfonamide Derivatives Hives and Itching    No  prescriptions prior to admission    No results found for this or any previous visit (from the past 48 hour(s)). No results found.  Review of Systems  All other systems reviewed and are negative.   Height 6\' 1"  (1.854 m), weight 99.791 kg (220 lb). Physical Exam  Constitutional:  WD, WN, NAD HEENT:  NCAT, EOMI Neuro/Psych:  Alert & oriented to person, place, and time; appropriate mood & affect Lymphatic: No generalized UE edema or lymphadenopathy Extremities / MSK:  Both UE are normal with respect to appearance, ranges of motion, joint stability, muscle strength/tone, sensation, & perfusion except as otherwise noted:  Bilaterally, there is tenderness over the A1 pulleys.  The left one locks with each flexion cycle, the right one catches to a lesser degree.  He also has an established flexion contracture for the left long finger, 20 at the PIP, 35 at the DIP.  Labs / Xrays:  No radiographic studies obtained today.  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   The details of the operative procedure were discussed with the patient.  Questions were invited and answered.  In addition to the goal of the procedure, the risks of the procedure to include but not limited to bleeding; infection; damage to the nerves or blood vessels that could result in bleeding, numbness, weakness, chronic pain, and the need for additional procedures; stiffness; the need for revision surgery; and anesthetic risks, were reviewed.  No specific outcome was guaranteed or implied.  Informed consent was obtained.  We will plan to proceed on Monday.  Tandre Conly A. 11/07/2014, 2:51 PM

## 2014-11-07 NOTE — Progress Notes (Signed)
Pt here-surgery mac-bmet and ekg done-hx pvd- Diabetes stable

## 2014-11-11 ENCOUNTER — Encounter (HOSPITAL_BASED_OUTPATIENT_CLINIC_OR_DEPARTMENT_OTHER): Payer: Self-pay | Admitting: *Deleted

## 2014-11-11 ENCOUNTER — Encounter (HOSPITAL_BASED_OUTPATIENT_CLINIC_OR_DEPARTMENT_OTHER)
Admission: RE | Disposition: A | Discharge status: Home / Self Care | Payer: Self-pay | Source: Ambulatory Visit | Attending: Orthopedic Surgery

## 2014-11-11 ENCOUNTER — Ambulatory Visit (HOSPITAL_BASED_OUTPATIENT_CLINIC_OR_DEPARTMENT_OTHER): Payer: Medicare Other | Admitting: Anesthesiology

## 2014-11-11 ENCOUNTER — Ambulatory Visit (HOSPITAL_BASED_OUTPATIENT_CLINIC_OR_DEPARTMENT_OTHER)
Admission: RE | Admit: 2014-11-11 | Discharge: 2014-11-11 | Disposition: A | Discharge status: Home / Self Care | Payer: Medicare Other | Source: Ambulatory Visit | Attending: Orthopedic Surgery | Admitting: Orthopedic Surgery

## 2014-11-11 DIAGNOSIS — M24542 Contracture, left hand: Secondary | ICD-10-CM | POA: Insufficient documentation

## 2014-11-11 DIAGNOSIS — K219 Gastro-esophageal reflux disease without esophagitis: Secondary | ICD-10-CM | POA: Insufficient documentation

## 2014-11-11 DIAGNOSIS — H5442 Blindness, left eye, normal vision right eye: Secondary | ICD-10-CM | POA: Insufficient documentation

## 2014-11-11 DIAGNOSIS — M65841 Other synovitis and tenosynovitis, right hand: Secondary | ICD-10-CM | POA: Insufficient documentation

## 2014-11-11 DIAGNOSIS — Z9889 Other specified postprocedural states: Secondary | ICD-10-CM | POA: Insufficient documentation

## 2014-11-11 DIAGNOSIS — E119 Type 2 diabetes mellitus without complications: Secondary | ICD-10-CM | POA: Insufficient documentation

## 2014-11-11 DIAGNOSIS — M65332 Trigger finger, left middle finger: Secondary | ICD-10-CM | POA: Insufficient documentation

## 2014-11-11 DIAGNOSIS — I10 Essential (primary) hypertension: Secondary | ICD-10-CM | POA: Diagnosis not present

## 2014-11-11 DIAGNOSIS — Z79899 Other long term (current) drug therapy: Secondary | ICD-10-CM | POA: Insufficient documentation

## 2014-11-11 DIAGNOSIS — Z981 Arthrodesis status: Secondary | ICD-10-CM | POA: Insufficient documentation

## 2014-11-11 DIAGNOSIS — Z8673 Personal history of transient ischemic attack (TIA), and cerebral infarction without residual deficits: Secondary | ICD-10-CM | POA: Insufficient documentation

## 2014-11-11 DIAGNOSIS — N400 Enlarged prostate without lower urinary tract symptoms: Secondary | ICD-10-CM | POA: Insufficient documentation

## 2014-11-11 DIAGNOSIS — M810 Age-related osteoporosis without current pathological fracture: Secondary | ICD-10-CM | POA: Insufficient documentation

## 2014-11-11 DIAGNOSIS — G894 Chronic pain syndrome: Secondary | ICD-10-CM | POA: Insufficient documentation

## 2014-11-11 DIAGNOSIS — M65842 Other synovitis and tenosynovitis, left hand: Secondary | ICD-10-CM | POA: Diagnosis not present

## 2014-11-11 DIAGNOSIS — Z87891 Personal history of nicotine dependence: Secondary | ICD-10-CM | POA: Insufficient documentation

## 2014-11-11 HISTORY — PX: TRIGGER FINGER RELEASE: SHX641

## 2014-11-11 LAB — GLUCOSE, CAPILLARY
GLUCOSE-CAPILLARY: 93 mg/dL (ref 70–99)
Glucose-Capillary: 103 mg/dL — ABNORMAL HIGH (ref 70–99)

## 2014-11-11 LAB — POCT HEMOGLOBIN-HEMACUE: Hemoglobin: 12.1 g/dL — ABNORMAL LOW (ref 13.0–17.0)

## 2014-11-11 SURGERY — RELEASE, A1 PULLEY, FOR TRIGGER FINGER
Anesthesia: General | Site: Hand | Laterality: Left

## 2014-11-11 MED ORDER — MIDAZOLAM HCL 5 MG/5ML IJ SOLN
INTRAMUSCULAR | Status: DC | PRN
  Administered 2014-11-11: 2 mg via INTRAVENOUS

## 2014-11-11 MED ORDER — CLINDAMYCIN PHOSPHATE 900 MG/50ML IV SOLN: 900.0000 mg | INTRAVENOUS | Status: DC

## 2014-11-11 MED ORDER — FENTANYL CITRATE 0.05 MG/ML IJ SOLN
INTRAMUSCULAR | Status: DC | PRN
  Administered 2014-11-11: 100 ug via INTRAVENOUS

## 2014-11-11 MED ORDER — LIDOCAINE HCL (CARDIAC) 20 MG/ML IV SOLN
INTRAVENOUS | Status: DC | PRN
  Administered 2014-11-11: 50 mg via INTRAVENOUS

## 2014-11-11 MED ORDER — MIDAZOLAM HCL 2 MG/2ML IJ SOLN
INTRAMUSCULAR | Status: AC
  Filled 2014-11-11: qty 2

## 2014-11-11 MED ORDER — FENTANYL CITRATE 0.05 MG/ML IJ SOLN: 50.0000 ug | INTRAMUSCULAR | Status: DC | PRN

## 2014-11-11 MED ORDER — CLINDAMYCIN PHOSPHATE 900 MG/50ML IV SOLN
INTRAVENOUS | Status: AC
  Filled 2014-11-11: qty 50

## 2014-11-11 MED ORDER — ONDANSETRON HCL 4 MG/2ML IJ SOLN: 4.0000 mg | Freq: Once | INTRAMUSCULAR | Status: DC | PRN

## 2014-11-11 MED ORDER — MIDAZOLAM HCL 2 MG/2ML IJ SOLN: 1.0000 mg | INTRAMUSCULAR | Status: DC | PRN

## 2014-11-11 MED ORDER — FENTANYL CITRATE 0.05 MG/ML IJ SOLN
INTRAMUSCULAR | Status: AC
  Filled 2014-11-11: qty 2

## 2014-11-11 MED ORDER — LACTATED RINGERS IV SOLN
INTRAVENOUS | Status: DC
  Administered 2014-11-11: 08:00:00 via INTRAVENOUS

## 2014-11-11 MED ORDER — HYDROMORPHONE HCL 1 MG/ML IJ SOLN: 0.2500 mg | INTRAMUSCULAR | Status: DC | PRN

## 2014-11-11 MED ORDER — LACTATED RINGERS IV SOLN: INTRAVENOUS | Status: DC

## 2014-11-11 MED ORDER — PROPOFOL 10 MG/ML IV BOLUS
INTRAVENOUS | Status: DC | PRN
  Administered 2014-11-11 (×3): 20 mg via INTRAVENOUS

## 2014-11-11 MED ORDER — OXYCODONE HCL 5 MG/5ML PO SOLN: 5.0000 mg | Freq: Once | ORAL | Status: DC | PRN

## 2014-11-11 MED ORDER — OXYCODONE HCL 5 MG PO TABS: 5.0000 mg | ORAL_TABLET | Freq: Once | ORAL | Status: DC | PRN

## 2014-11-11 MED ORDER — LIDOCAINE HCL 1 % IJ SOLN
INTRAMUSCULAR | Status: DC | PRN
  Administered 2014-11-11: 5 mL

## 2014-11-11 MED ORDER — BUPIVACAINE-EPINEPHRINE 0.5% -1:200000 IJ SOLN
INTRAMUSCULAR | Status: DC | PRN
  Administered 2014-11-11: 5 mL

## 2014-11-11 SURGICAL SUPPLY — 40 items
BLADE MINI RND TIP GREEN BEAV (BLADE) IMPLANT
BLADE SURG 15 STRL LF DISP TIS (BLADE) ×1 IMPLANT
BLADE SURG 15 STRL SS (BLADE) ×2
BNDG COHESIVE 1X5 TAN STRL LF (GAUZE/BANDAGES/DRESSINGS) ×3 IMPLANT
BNDG CONFORM 2 STRL LF (GAUZE/BANDAGES/DRESSINGS) ×3 IMPLANT
BNDG ESMARK 4X9 LF (GAUZE/BANDAGES/DRESSINGS) ×3 IMPLANT
CHLORAPREP W/TINT 26ML (MISCELLANEOUS) ×3 IMPLANT
COVER BACK TABLE 60X90IN (DRAPES) ×3 IMPLANT
COVER MAYO STAND STRL (DRAPES) ×3 IMPLANT
CUFF TOURNIQUET SINGLE 18IN (TOURNIQUET CUFF) ×3 IMPLANT
DRAPE EXTREMITY T 121X128X90 (DRAPE) ×3 IMPLANT
DRAPE SURG 17X23 STRL (DRAPES) ×3 IMPLANT
DRSG EMULSION OIL 3X3 NADH (GAUZE/BANDAGES/DRESSINGS) ×3 IMPLANT
GLOVE BIO SURGEON STRL SZ7.5 (GLOVE) ×3 IMPLANT
GLOVE BIOGEL PI IND STRL 7.0 (GLOVE) ×3 IMPLANT
GLOVE BIOGEL PI IND STRL 8 (GLOVE) ×1 IMPLANT
GLOVE BIOGEL PI INDICATOR 7.0 (GLOVE) ×6
GLOVE BIOGEL PI INDICATOR 8 (GLOVE) ×2
GLOVE ECLIPSE 6.5 STRL STRAW (GLOVE) ×9 IMPLANT
GOWN STRL REUS W/ TWL LRG LVL3 (GOWN DISPOSABLE) ×3 IMPLANT
GOWN STRL REUS W/TWL LRG LVL3 (GOWN DISPOSABLE) ×6
GOWN STRL REUS W/TWL XL LVL3 (GOWN DISPOSABLE) ×3 IMPLANT
NDL SAFETY ECLIPSE 18X1.5 (NEEDLE) ×1 IMPLANT
NEEDLE HYPO 18GX1.5 SHARP (NEEDLE) ×2
NEEDLE HYPO 25X1 1.5 SAFETY (NEEDLE) ×3 IMPLANT
NS IRRIG 1000ML POUR BTL (IV SOLUTION) ×3 IMPLANT
PACK BASIN DAY SURGERY FS (CUSTOM PROCEDURE TRAY) ×3 IMPLANT
PADDING CAST ABS 4INX4YD NS (CAST SUPPLIES)
PADDING CAST ABS COTTON 4X4 ST (CAST SUPPLIES) IMPLANT
PADDING CAST COTTON 2X4 NS (CAST SUPPLIES) IMPLANT
SPONGE GAUZE 4X4 12PLY STER LF (GAUZE/BANDAGES/DRESSINGS) ×3 IMPLANT
STOCKINETTE 6  STRL (DRAPES) ×2
STOCKINETTE 6 STRL (DRAPES) ×1 IMPLANT
SUT VICRYL RAPIDE 4-0 (SUTURE) IMPLANT
SUT VICRYL RAPIDE 4/0 PS 2 (SUTURE) ×3 IMPLANT
SYR BULB 3OZ (MISCELLANEOUS) ×3 IMPLANT
SYRINGE 10CC LL (SYRINGE) IMPLANT
TOWEL OR 17X24 6PK STRL BLUE (TOWEL DISPOSABLE) ×3 IMPLANT
TOWEL OR NON WOVEN STRL DISP B (DISPOSABLE) IMPLANT
UNDERPAD 30X30 INCONTINENT (UNDERPADS AND DIAPERS) ×3 IMPLANT

## 2014-11-11 NOTE — Interval H&P Note (Signed)
History and Physical Interval Note:  11/11/2014 7:36 AM  [name]  has presented today for surgery, with the diagnosis of LEFT LONG TRIGGER FINGER  The various methods of treatment have been discussed with the patient and family. After consideration of risks, benefits and other options for treatment, the patient has consented to  Procedure(s): LEFT LONG FINGER RELEASE TRIGGER FINGER/A-1 PULLEY (Left) as a surgical intervention .  The patient's history has been reviewed, patient examined, no change in status, stable for surgery.  I have reviewed the patient's chart and labs.  Questions were answered to the patient's satisfaction.     Arlita Buffkin A.

## 2014-11-11 NOTE — Op Note (Signed)
11/11/2014  7:36 AM  PATIENT:  [name]  69 y.o. male  PRE-OPERATIVE DIAGNOSIS:  Left long finger chronic trigger digit  POST-OPERATIVE DIAGNOSIS:  Same  PROCEDURE:  Left long finger trigger finger release and PIP joint manipulation under anesthesia  SURGEON: Rayvon Char. Grandville Silos, MD  PHYSICIAN ASSISTANT: Morley Kos, OPA-C  ANESTHESIA:  local and MAC  SPECIMENS:  None  DRAINS:   None  EBL:  less than 50 mL  PREOPERATIVE INDICATIONS:  [name] is a  69 y.o. male with chronic left long finger trigger digit and PIP flexion contracture.  The risks benefits and alternatives were discussed with the patient preoperatively including but not limited to the risks of infection, bleeding, nerve injury, cardiopulmonary complications, the need for revision surgery, among others, and the patient verbalized understanding and consented to proceed.  OPERATIVE IMPLANTS: None  OPERATIVE PROCEDURE:  After receiving prophylactic antibiotics, the patient was escorted to the operative theatre and placed in a supine position.  A surgical "time-out" was performed during which the planned procedure, proposed operative site, and the correct patient identity were compared to the operative consent and agreement confirmed by the circulating nurse according to current facility policy.  Following application of a tourniquet to the operative extremity, the incision was marked and anesthetized with a mixture of lidocaine and Marcaine bearing epinephrine.  The exposed skin was prepped with Chloraprep and draped in the usual sterile fashion.   The limb was exsanguinated with an Esmarch bandage and the tourniquet inflated to approximately 149mmHg higher than systolic BP.  An oblique incision was made over the A1 pulley of the long finger. Neurovascular structures were reflected radially and ulnarly, and the distal flap mobilized and retracted. The A1 pulley was quite thickened, and it was split down its midline.  In addition there were crossing bands proximal listed were also thickened and these were released. The superficialis tendon was frayed, had thickened tenosynovium about it and even longitudinal split tearing. This was debrided. The PIP joint which then had a flexion contracture was slowly steadily manipulated and seemed to achieve full extension. Tourniquet was released the wound was irrigated and the skin closed with 4-0 Vicryl Rapide interrupted sutures.  A light dressing was applied. He was taken to the recovery room.  DISPOSITION: He'll be discharged home today with typical instructions returning in 7-10 days.

## 2014-11-11 NOTE — Discharge Instructions (Addendum)
Discharge Instructions   You have a light dressing on your hand.  You may begin gentle motion of your fingers and hand immediately, but you should not do any heavy lifting or gripping.  Elevate your hand to reduce pain & swelling of the digits.  Ice over the operative site may be helpful to reduce pain & swelling.  DO NOT USE HEAT. Pain medicine has been prescribed for you.  Use your medicine as needed over the first 48 hours, and then you can begin to taper your use. You may use Tylenol in place of your prescribed pain medication, but not IN ADDITION to it. Leave the dressing in place until the third day after your surgery and then remove it, leaving it open to air.  After the bandage has been removed you may shower, regularly washing the incision and letting the water run over it, but not submerging it (no swimming, soaking it in dishwater, etc.) You may drive a car when you are off of prescription pain medications and can safely control your vehicle with both hands. We will address whether therapy will be required or not when you return to the office. You may have already made your follow-up appointment when we completed your preop visit.  If not, please call our office today or the next business day to make your return appointment for 7-10 days after surgery.   Please call [phone] during normal business hours or [phone] after hours for any problems. Including the following:  - excessive redness of the incisions - drainage for more than 4 days - fever of more than 101.5 F  *Please note that pain medications will not be refilled after hours or on weekends.    Post Anesthesia Home Care Instructions  Activity: Get plenty of rest for the remainder of the day. A responsible adult should stay with you for 24 hours following the procedure.  For the next 24 hours, DO NOT: -Drive a car -Paediatric nurse -Drink alcoholic beverages -Take any medication unless instructed by your  physician -Make any legal decisions or sign important papers.  Meals: Start with liquid foods such as gelatin or soup. Progress to regular foods as tolerated. Avoid greasy, spicy, heavy foods. If nausea and/or vomiting occur, drink only clear liquids until the nausea and/or vomiting subsides. Call your physician if vomiting continues.  Special Instructions/Symptoms: Your throat may feel dry or sore from the anesthesia or the breathing tube placed in your throat during surgery. If this causes discomfort, gargle with warm salt water. The discomfort should disappear within 24 hours.

## 2014-11-11 NOTE — Anesthesia Preprocedure Evaluation (Signed)
Anesthesia Evaluation  Patient identified by MRN, date of birth, ID band Patient awake    Reviewed: Allergy & Precautions, NPO status , Patient's Chart, lab work & pertinent test results  Airway Mallampati: I  TM Distance: >3 FB Neck ROM: Full    Dental  (+) Upper Dentures, Lower Dentures, Dental Advisory Given   Pulmonary former smoker,  breath sounds clear to auscultation        Cardiovascular hypertension, Pt. on medications Rhythm:Regular Rate:Normal     Neuro/Psych    GI/Hepatic GERD-  Medicated and Controlled,  Endo/Other  diabetes, Well Controlled, Type 2, Oral Hypoglycemic Agents  Renal/GU      Musculoskeletal   Abdominal   Peds  Hematology   Anesthesia Other Findings   Reproductive/Obstetrics                             Anesthesia Physical Anesthesia Plan  ASA: III  Anesthesia Plan: General   Post-op Pain Management:    Induction: Intravenous  Airway Management Planned: Simple Face Mask  Additional Equipment:   Intra-op Plan:   Post-operative Plan:   Informed Consent: I have reviewed the patients History and Physical, chart, labs and discussed the procedure including the risks, benefits and alternatives for the proposed anesthesia with the patient or authorized representative who has indicated his/her understanding and acceptance.   Dental advisory given  Plan Discussed with: CRNA, Anesthesiologist and Surgeon  Anesthesia Plan Comments:         Anesthesia Quick Evaluation

## 2014-11-11 NOTE — Transfer of Care (Signed)
Immediate Anesthesia Transfer of Care Note  Patient: [name]  Procedure(s) Performed: Procedure(s): LEFT LONG FINGER RELEASE TRIGGER FINGER/A-1 PULLEY (Left)  Patient Location: PACU  Anesthesia Type:MAC  Level of Consciousness: awake, alert  and oriented  Airway & Oxygen Therapy: Patient Spontanous Breathing and Patient connected to face mask oxygen  Post-op Assessment: Report given to RN and Post -op Vital signs reviewed and stable  Post vital signs: stable  Last Vitals:  Filed Vitals:   11/11/14 0732  BP: 113/64  Pulse: 59  Temp: 36.8 C  Resp: 20    Complications: No apparent anesthesia complications

## 2014-11-11 NOTE — Anesthesia Postprocedure Evaluation (Signed)
  Anesthesia Post-op Note  Patient: Engineer, building services  Procedure(s) Performed: Procedure(s): LEFT LONG FINGER RELEASE TRIGGER FINGER/A-1 PULLEY (Left)  Patient Location: PACU  Anesthesia Type: MAC   Level of Consciousness: awake, alert  and oriented  Airway and Oxygen Therapy: Patient Spontanous Breathing  Post-op Pain: none  Post-op Assessment: Post-op Vital signs reviewed  Post-op Vital Signs: Reviewed  Last Vitals:  Filed Vitals:   11/11/14 1117  BP: 136/72  Pulse: 91  Temp: 36.4 C  Resp: 16    Complications: No apparent anesthesia complications

## 2014-11-13 ENCOUNTER — Encounter (HOSPITAL_BASED_OUTPATIENT_CLINIC_OR_DEPARTMENT_OTHER): Payer: Self-pay | Admitting: Orthopedic Surgery

## 2014-11-15 ENCOUNTER — Other Ambulatory Visit (INDEPENDENT_AMBULATORY_CARE_PROVIDER_SITE_OTHER): Payer: Medicare Other

## 2014-11-15 ENCOUNTER — Encounter (HOSPITAL_BASED_OUTPATIENT_CLINIC_OR_DEPARTMENT_OTHER): Payer: Self-pay | Admitting: Orthopedic Surgery

## 2014-11-15 ENCOUNTER — Other Ambulatory Visit: Payer: Medicare Other

## 2014-11-15 DIAGNOSIS — E119 Type 2 diabetes mellitus without complications: Secondary | ICD-10-CM | POA: Diagnosis not present

## 2014-11-15 LAB — LIPID PANEL
Cholesterol: 155 mg/dL (ref 0–200)
HDL: 39.8 mg/dL (ref >39.00)
LDL Cholesterol: 98 mg/dL (ref 0–99)
NONHDL: 115.2
Total CHOL/HDL Ratio: 4
Triglycerides: 85 mg/dL (ref 0.0–149.0)
VLDL: 17 mg/dL (ref 0.0–40.0)

## 2014-11-15 LAB — URINALYSIS, ROUTINE W REFLEX MICROSCOPIC
Bilirubin Urine: NEGATIVE
HGB URINE DIPSTICK: NEGATIVE
Ketones, ur: NEGATIVE
Leukocytes, UA: NEGATIVE
NITRITE: NEGATIVE
RBC / HPF: NONE SEEN (ref 0–?)
Specific Gravity, Urine: 1.01 (ref 1.000–1.030)
TOTAL PROTEIN, URINE-UPE24: NEGATIVE
UROBILINOGEN UA: 0.2 (ref 0.0–1.0)
Urine Glucose: NEGATIVE
pH: 7 (ref 5.0–8.0)

## 2014-11-15 LAB — COMPREHENSIVE METABOLIC PANEL
ALBUMIN: 4 g/dL (ref 3.5–5.2)
ALT: 21 U/L (ref 0–53)
AST: 29 U/L (ref 0–37)
Alkaline Phosphatase: 69 U/L (ref 39–117)
BUN: 15 mg/dL (ref 6–23)
CO2: 30 meq/L (ref 19–32)
CREATININE: 0.96 mg/dL (ref 0.40–1.50)
Calcium: 9.2 mg/dL (ref 8.4–10.5)
Chloride: 94 mEq/L — ABNORMAL LOW (ref 96–112)
GFR: 82.64 mL/min (ref >60.00)
GLUCOSE: 86 mg/dL (ref 70–99)
Potassium: 4.2 mEq/L (ref 3.5–5.1)
Sodium: 127 mEq/L — ABNORMAL LOW (ref 135–145)
Total Bilirubin: 0.5 mg/dL (ref 0.2–1.2)
Total Protein: 7.7 g/dL (ref 6.0–8.3)

## 2014-11-15 LAB — MICROALBUMIN / CREATININE URINE RATIO
CREATININE, U: 56 mg/dL
Microalb Creat Ratio: 1.3 mg/g (ref 0.0–30.0)

## 2014-11-15 LAB — HEMOGLOBIN A1C: HEMOGLOBIN A1C: 6.7 % — AB (ref 4.6–6.5)

## 2014-11-20 ENCOUNTER — Ambulatory Visit (INDEPENDENT_AMBULATORY_CARE_PROVIDER_SITE_OTHER): Payer: Medicare Other | Admitting: Endocrinology

## 2014-11-20 VITALS — BP 140/58 | HR 56 | Temp 98.0°F | Wt 215.8 lb

## 2014-11-20 DIAGNOSIS — E871 Hypo-osmolality and hyponatremia: Secondary | ICD-10-CM | POA: Diagnosis not present

## 2014-11-20 DIAGNOSIS — M810 Age-related osteoporosis without current pathological fracture: Secondary | ICD-10-CM | POA: Diagnosis not present

## 2014-11-20 DIAGNOSIS — E119 Type 2 diabetes mellitus without complications: Secondary | ICD-10-CM | POA: Diagnosis not present

## 2014-11-20 DIAGNOSIS — I951 Orthostatic hypotension: Secondary | ICD-10-CM | POA: Diagnosis not present

## 2014-11-20 DIAGNOSIS — M65332 Trigger finger, left middle finger: Secondary | ICD-10-CM | POA: Diagnosis not present

## 2014-11-20 NOTE — Progress Notes (Signed)
Pre visit review using our clinic review tool, if applicable. No additional management support is needed unless otherwise documented below in the visit note. 

## 2014-11-20 NOTE — Progress Notes (Signed)
Patient ID: [name], male   DOB: [birth year], 69 y.o.   MRN: [id number]   Reason for Appointment: follow-up   History of Present Illness   1. HYPONATREMIA:  This is chronic and of unclear etiology He thinks that the condition was diagnosed even before he was on chronic narcotic medications or Zoloft Evaluation has usually showed a urine osmolality about the same as his serum osmolality He has been evaluated with thyroid functions and ACTH stim relation test which did not show any definite abnormality  He had tried taking salt tablets previously without adequate benefit Also on his last visit he was noted to be drinking about 8 glasses a day and is not restricting fluids He has been somewhat irregular with compliance with Florinef which has been prescribed before Sodium has been as low as 123 in 2015; however his not very symptomatic even at this point Urine osmolality last was  248  He was told to start back on his Florinef on his last visit in 11/15 when his sodium was 126 He is currently taking this every day and he thinks he has been compliant with this He does complain of fatigue but no nausea or decreased appetite.     His sodium is only minimally improved  He continues to take narcotic pain medications chronically and Zoloft   Lab Results  Component Value Date   CREATININE 0.96 11/15/2014   BUN 15 11/15/2014   NA 127* 11/15/2014   K 4.2 11/15/2014   CL 94* 11/15/2014   CO2 30 11/15/2014     2. Type 2 DIABETES MELITUS  He has had relatively mild diabetes which is usually well controlled with Januvia alone.    Previously was taking 1 mg Amaryl as needed for high readings which would cause  hypoglycemia at times Currently not taking Amaryl  Difficult to assess his home control as he does not bring his monitor for review His A1c is  About the same , has been consistently just above normal   He is not able to do much physical activity  Monitors blood glucose:  less  than every  day .    Glucometer: Breeze      Blood Glucose readings from review of monitor: readings before breakfast: 104-120 PC <145   Hypoglycemia frequency:  none        Meals: 1-2 meals per day.          Physical activity: exercise: Minimal, limited activity  Wt Readings from Last 3 Encounters:  11/20/14 215 lb 12.8 oz (97.886 kg)  11/11/14 216 lb (97.977 kg)  08/19/14 218 lb 9.6 oz (99.156 kg)    Lab Results  Component Value Date   HGBA1C 6.7* 11/15/2014   HGBA1C 6.6* 07/03/2014   HGBA1C 6.8* 03/12/2014   Lab Results  Component Value Date   MICROALBUR <0.7 11/15/2014   [place] 98 11/15/2014   CREATININE 0.96 11/15/2014           Medication List       This list is accurate as of: 11/20/14  9:23 PM.  Always use your most recent med list.               BAYER BREEZE 2 SYSTEM W/DEVICE Kit  Use to check blood sugars once a day     BAYER BREEZE 2 TEST Disk  Generic drug:  Glucose Blood  USE TO TEST BLOOD GLUCOSE THREE TIMES DAILY     buPROPion 300 MG 24 hr tablet  Commonly known as:  WELLBUTRIN XL  Take 300 mg by mouth every morning.     celecoxib 200 MG capsule  Commonly known as:  CELEBREX  Take 200 mg by mouth 2 (two) times daily.     clonazePAM 1 MG tablet  Commonly known as:  KLONOPIN  Take 1 mg by mouth 3 (three) times daily.     cyclobenzaprine 10 MG tablet  Commonly known as:  FLEXERIL     fentaNYL 100 MCG/HR  Commonly known as:  DURAGESIC - dosed mcg/hr  Place 100 mcg onto the skin every 3 (three) days.     FLOMAX 0.4 MG Caps capsule  Generic drug:  tamsulosin  Take 0.4 mg by mouth 2 (two) times daily.     fludrocortisone 0.1 MG tablet  Commonly known as:  FLORINEF  Take 1 tablet every day     losartan 50 MG tablet  Commonly known as:  COZAAR     MICROLET LANCETS Misc     omeprazole 40 MG capsule  Commonly known as:  PRILOSEC  Take 40 mg by mouth 2 (two) times daily.     ORAL ELECTROLYTES PO  Take 6-8 tablets by mouth.      oxyCODONE 15 MG immediate release tablet  Commonly known as:  ROXICODONE  continuous as needed.     PROAIR HFA 108 (90 BASE) MCG/ACT inhaler  Generic drug:  albuterol     sertraline 100 MG tablet  Commonly known as:  ZOLOFT  Take 150 mg by mouth daily.     sitaGLIPtin 100 MG tablet  Commonly known as:  JANUVIA  Take 1 tablet (100 mg total) by mouth daily.     Sodium Chlorate Powd  Take 240 mg by mouth as needed.     VITACIRC-B 3-35-2 MG Tabs  Take 1 tablet by mouth 2 (two) times daily.     Vitamin D 1000 UNITS capsule  Take 6,000 Units by mouth daily.     zonisamide 50 MG capsule  Commonly known as:  ZONEGRAN  Take 150 mg by mouth at bedtime.        Allergies:  Allergies  Allergen Reactions  . Ivp Dye [Iodinated Diagnostic Agents]     Blood Pressure dropped----pt was pre-medicated with 13 hour prep and did fine with pre-meds--amy 03/09/11   . Penicillins     REACTION: gi upset  . Propoxyphene N-Acetaminophen Other (See Comments)    Sharp pains- headache  . Sulfonamide Derivatives Hives and Itching    Past Medical History  Diagnosis Date  . DJD (degenerative joint disease)     L wrist  . Lumbar disc disease   . HTN (hypertension)   . Anemia     NOS  . Osteoporosis     tx per [facility], Dr Prudencio Burly, thought due to heavy steriod use after 1978  . Spine fracture     hx, multiple  . Blindness of left eye     near blindness. 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 GERD (gastroesophageal reflux disease)   . DM2 (diabetes mellitus, type 2)   . Anxiety   . Depression   . BPH (benign prostatic hypertrophy)   . HLD (hyperlipidemia)   . Chronic pain syndrome   . Chronic hyponatremia   . Diabetes mellitus   . Broken foot Oct. 12, 2013    Right foot Fx  . Varicose veins     Past Surgical History  Procedure Laterality Date  . Right hand fracture  1969  .  Left ankle ganglion cyst  1976  . Lumbar disc surgery  1981, 2004  . Nasal sinus surgery      multiple- x8. last 1997 with  obliteration  . Left cataract  1996    right - 1997  . Left foot surgery  1998    R surgery/fracture - 1999  . Left cts  2006    R CTS - 2007  . Left wrist/hand fusion  2008  . L foot open repair jones fracture  2010     5th metetarsal   . Rotator cuff repair  2006    R, than repeat 2011, Dr Theda Sers  . Vocal surgery  1996  . Repair thoracic aorta    . Trigger finger release Left 11/11/2014    Procedure: LEFT LONG FINGER RELEASE TRIGGER FINGER/A-1 PULLEY;  Surgeon: Milly Jakob, MD;  Location: [facility];  Service: Orthopedics;  Laterality: Left;    Family History  Problem Relation Age of Onset  . Lung cancer Father     and grandmother  . Throat cancer      family hx - "bad living" - also lung CA, heart disease and renal failure    . Diabetes Mother     DM, also sister and MGF     Social History:  reports that he quit smoking about 26 years ago. He has never used smokeless tobacco. He reports that he does not drink alcohol or use illicit drugs.  Review of Systems:  HYPERTENSION: He has had only mild hypertension in the past.  He has been  on Losartan now from PCP, previously had tried Vasotec which probably caused cough   However recently was feeling lightheaded and has stopped the losartan on his own  Blood pressure today is ASYMMETRICAL with much lower reading on the right side However has no increased blood pressure despite taking FLORINEF for his hyponatremia Currently is taking Aldactone only rarely which was prescribed by cardiologist   Lipids: LDL has been below 100 and has not been on any lipid-lowering drugs  Lab Results  Component Value Date   CHOL 155 11/15/2014   HDL 39.80 11/15/2014   LDLCALC 98 11/15/2014   LDLDIRECT 108.2 03/13/2010   TRIG 85.0 11/15/2014   CHOLHDL 4 11/15/2014   No history of thyroid disease:  Lab Results  Component Value Date   FREET4 0.73 08/14/2014   FREET4 0.97 07/03/2014   TSH 2.698 03/12/2014   TSH 2.63  07/30/2013   TSH 1.43 10/17/2009    History of chronic depression treated by psychiatrist, currently on 150 mg of Zoloft  History of osteoporosis getting Reclast annually with the last infusion in 11/2013 The prior bone density showed T score -2.7  Recent bone density in 12/15 showed normal T score at the spine, at the femoral neck was -1.7 on one side and -2.0 on the other Has no history of secondary causes like hypogonadism and his testosterone level is again normal Last fracture 10/13, foot, traumatic. He has had some falls recently without any fractures Vitamin D3 has been supplemented, has been taking   5000 units a day    Neuropathy: He has numbness in his feet, long term. Has been given diabetic shoes.  Foot exam done in 3/15  He has history of chronic myofascial pain of unclear etiology.  On fentanyl and  is also taking Oxycodone 47m, 2-4 times a day because of severe pain      Examination:   BP 140/58 mmHg  Pulse 56  Temp(Src) 98 F (36.7 C) (Oral)  Wt 215 lb 12.8 oz (97.886 kg)  Body mass index is 28.48 kg/(m^2).   130/60 left and 82/60 on the right. 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  Continue fluid restriction  Diabetes type 2   The patient's diabetes control appears to be well controlled with home readings mostly in the normal range at different times He is not significantly obese A1c near normal He has difficulty exercising because of his multiple medical issues He is doing fairly well with Januvia  alone and can continue  HYPERTENSION:  Do not think he has hypertension at this time as he is not on any antihypertensives and actually has a normal blood pressure with using Florinef for his hyponatremia Note made in his chart to make his blood pressure reading only on the left side, probably has some peripheral vascular issue on the right side and he would discuss this with his vascular surgeon  Osteoporosis with history of fractures: Currently asymptomatic.  Bone density has improved. He will get scheduled for Reclast; currently has normal renal function and calcium levels He needs to continue his calcium and vitamin D  LIPIDS: His LDL is below 100 on recent labs    Patient Instructions  Please check blood sugars at least half the time about 2 hours after any meal and 3 times per week on waking up.   Please bring blood sugar monitor to each visit.  Recommended blood sugar levels about 2 hours after meal is 120-150 and on waking up 90-120  Stay on 1 Florinef daily      Lauri Till 11/20/2014, 9:23 PM   Labs:  Lab on 11/15/2014  Component Date Value Ref Range Status  . Hgb A1c MFr Bld 11/15/2014 6.7* 4.6 - 6.5 % Final   Glycemic Control Guidelines for People with Diabetes:Non Diabetic:  <6%Goal of Therapy: <7%Additional Action Suggested:  >8%   . Sodium 11/15/2014 127* 135 - 145 mEq/L Final  . Potassium 11/15/2014 4.2  3.5 - 5.1 mEq/L Final  . Chloride 11/15/2014 94* 96 - 112 mEq/L Final  . CO2 11/15/2014 30  19 - 32 mEq/L Final  . Glucose, Bld 11/15/2014 86  70 - 99 mg/dL Final  . BUN 11/15/2014 15  6 - 23 mg/dL Final  . Creatinine, Ser 11/15/2014 0.96  0.40 - 1.50 mg/dL Final  . Total Bilirubin 11/15/2014 0.5  0.2 - 1.2 mg/dL Final  . Alkaline Phosphatase 11/15/2014 69  39 - 117 U/L Final  . AST 11/15/2014 29  0 - 37 U/L Final  . ALT 11/15/2014 21  0 - 53 U/L Final  . Total Protein 11/15/2014 7.7  6.0 - 8.3 g/dL Final  . Albumin 11/15/2014 4.0  3.5 - 5.2 g/dL Final  .  Calcium 11/15/2014 9.2  8.4 - 10.5 mg/dL Final  . GFR 11/15/2014 82.64  >60.00 mL/min Final  . Cholesterol 11/15/2014 155  0 - 200 mg/dL Final   ATP III Classification  Desirable:  < 200 mg/dL               Borderline High:  200 - 239 mg/dL          High:  > = 240 mg/dL  . Triglycerides 11/15/2014 85.0  0.0 - 149.0 mg/dL Final   Normal:  <150 mg/dLBorderline High:  150 - 199 mg/dL  . HDL 11/15/2014 39.80  >39.00 mg/dL Final  . VLDL 11/15/2014 17.0  0.0 - 40.0 mg/dL Final  . LDL Cholesterol 11/15/2014 98  0 - 99 mg/dL Final  . Total CHOL/HDL Ratio 11/15/2014 4   Final                  Men          Women1/2 Average Risk     3.4          3.3Average Risk          5.0          4.42X Average Risk          9.6          7.13X Average Risk          15.0          11.0                      . NonHDL 11/15/2014 115.20   Final   NOTE:  Non-HDL goal should be 30 mg/dL higher than patient's LDL goal (i.e. LDL goal of < 70 mg/dL, would have non-HDL goal of < 100 mg/dL)  . Microalb, Ur 11/15/2014 <0.7  0.0 - 1.9 mg/dL Final  . Creatinine,U 11/15/2014 56.0   Final  . Microalb Creat Ratio 11/15/2014 1.3  0.0 - 30.0 mg/g Final  . Color, Urine 11/15/2014 YELLOW  Yellow;Lt. Yellow Final  . APPearance 11/15/2014 CLEAR  Clear Final  . Specific Gravity, Urine 11/15/2014 1.010  1.000-1.030 Final  . pH 11/15/2014 7.0  5.0 - 8.0 Final  . Total Protein, Urine 11/15/2014 NEGATIVE  Negative Final  . Urine Glucose 11/15/2014 NEGATIVE  Negative Final  . Ketones, ur 11/15/2014 NEGATIVE  Negative Final  . Bilirubin Urine 11/15/2014 NEGATIVE  Negative Final  . Hgb urine dipstick 11/15/2014 NEGATIVE  Negative Final  . Urobilinogen, UA 11/15/2014 0.2  0.0 - 1.0 Final  . Leukocytes, UA 11/15/2014 NEGATIVE  Negative Final  . Nitrite 11/15/2014 NEGATIVE  Negative Final  . WBC, UA 11/15/2014 0-2/hpf  0-2/hpf Final  . RBC / HPF 11/15/2014 none seen  0-2/hpf Final  . Squamous Epithelial / LPF 11/15/2014 Rare(0-4/hpf)   Rare(0-4/hpf) Final

## 2014-11-20 NOTE — Patient Instructions (Signed)
Please check blood sugars at least half the time about 2 hours after any meal and 3 times per week on waking up.   Please bring blood sugar monitor to each visit.  Recommended blood sugar levels about 2 hours after meal is 120-150 and on waking up 90-120  Stay on 1 Florinef daily

## 2014-11-24 ENCOUNTER — Other Ambulatory Visit: Payer: Self-pay | Admitting: Endocrinology

## 2014-11-25 ENCOUNTER — Other Ambulatory Visit: Payer: Self-pay | Admitting: *Deleted

## 2014-11-25 MED ORDER — GLUCOSE BLOOD VI DISK: DISK | Status: DC

## 2014-11-29 ENCOUNTER — Encounter: Payer: Self-pay | Admitting: Family

## 2014-12-02 ENCOUNTER — Other Ambulatory Visit: Payer: Self-pay | Admitting: Surgery

## 2014-12-02 ENCOUNTER — Encounter: Payer: Self-pay | Admitting: Family

## 2014-12-02 ENCOUNTER — Ambulatory Visit (HOSPITAL_COMMUNITY)
Admission: RE | Admit: 2014-12-02 | Discharge: 2014-12-02 | Disposition: A | Discharge status: Home / Self Care | Payer: Medicare Other | Source: Ambulatory Visit | Attending: Family | Admitting: Family

## 2014-12-02 ENCOUNTER — Ambulatory Visit (INDEPENDENT_AMBULATORY_CARE_PROVIDER_SITE_OTHER): Payer: Medicare Other | Admitting: Family

## 2014-12-02 ENCOUNTER — Ambulatory Visit (INDEPENDENT_AMBULATORY_CARE_PROVIDER_SITE_OTHER)
Admission: RE | Admit: 2014-12-02 | Discharge: 2014-12-02 | Disposition: A | Discharge status: Home / Self Care | Payer: Medicare Other | Source: Ambulatory Visit | Attending: Family | Admitting: Family

## 2014-12-02 VITALS — BP 84/59 | HR 65 | Resp 16 | Ht 71.0 in | Wt 220.0 lb

## 2014-12-02 DIAGNOSIS — I739 Peripheral vascular disease, unspecified: Secondary | ICD-10-CM | POA: Diagnosis not present

## 2014-12-02 DIAGNOSIS — Z48812 Encounter for surgical aftercare following surgery on the circulatory system: Secondary | ICD-10-CM | POA: Diagnosis not present

## 2014-12-02 DIAGNOSIS — E119 Type 2 diabetes mellitus without complications: Secondary | ICD-10-CM | POA: Diagnosis not present

## 2014-12-02 DIAGNOSIS — I6523 Occlusion and stenosis of bilateral carotid arteries: Secondary | ICD-10-CM

## 2014-12-02 DIAGNOSIS — Z9889 Other specified postprocedural states: Secondary | ICD-10-CM

## 2014-12-02 DIAGNOSIS — Z87891 Personal history of nicotine dependence: Secondary | ICD-10-CM | POA: Diagnosis not present

## 2014-12-02 DIAGNOSIS — I63239 Cerebral infarction due to unspecified occlusion or stenosis of unspecified carotid arteries: Secondary | ICD-10-CM

## 2014-12-02 DIAGNOSIS — I10 Essential (primary) hypertension: Secondary | ICD-10-CM | POA: Insufficient documentation

## 2014-12-02 NOTE — Patient Instructions (Signed)

## 2014-12-02 NOTE — Progress Notes (Signed)
[facility] [place] HISTORY AND PHYSICAL   MRN : [id number]  History of Present Illness:   [name] is a 69 y.o. male patient of Dr. Trula Slade who is status post aortobifemoral bypass graft for bilateral claudication as well as left carotid endarterectomy in 2012. He began complaining of leg swelling as well as prominent varicosities. He recently underwent laser ablation of the right small saphenous and left great saphenous vein for swelling and pain. He has done well from this procedure and has had significant improvement in his leg swelling.He c/o right sciatic pain in the last few months. He denies non healing wounds. He had what sounds like a left occular stroke in 2009, no stroke or TIA activity since then. Pt c/o tingling in both hands, right worse than left, denies dizziness.  He had left trigger finger repair early April 2016. His states his right foot/leg is weak, attributes to fractured vertebrae and back surgeries, his right foot gets hung up on carpets, etc.; he fell February 2016.    Pt Diabetic: Yes, states his A1C remains less than 7 Pt smoker: former smoker, quit in 1990  Pt meds include: Statin :No, states his cholesterol is good ASA: No, bothers his stomach Other anticoagulants/antiplatelets: no  Current Outpatient Prescriptions  Medication Sig Dispense Refill  . Blood Glucose Monitoring Suppl (BAYER BREEZE 2 SYSTEM) W/DEVICE KIT Use to check blood sugars once a day 1 kit 0  . buPROPion (WELLBUTRIN XL) 300 MG 24 hr tablet Take 300 mg by mouth every morning.     . celecoxib (CELEBREX) 200 MG capsule Take 200 mg by mouth 2 (two) times daily.   1  . Cholecalciferol (VITAMIN D) 1000 UNITS capsule Take 5,000 Units by mouth daily.     . clonazePAM (KLONOPIN) 1 MG tablet Take 1 mg by mouth 3 (three) times daily.     . cyclobenzaprine (FLEXERIL) 10 MG tablet   0  . fentaNYL (DURAGESIC - DOSED MCG/HR) 100 MCG/HR Place 100 mcg onto the skin every 3  (three) days.    . fludrocortisone (FLORINEF) 0.1 MG tablet Take 1 tablet every day 30 tablet 3  . Glucose Blood (BAYER BREEZE 2 TEST) DISK USE TO TEST BLOOD GLUCOSE THREE TIMES DAILY. Dx: E11.49 100 each 11  . L-Methylfolate-B6-B12 (VITACIRC-B) 3-35-2 MG TABS Take 1 tablet by mouth 2 (two) times daily.     Marland Kitchen losartan (COZAAR) 50 MG tablet   0  . MICROLET LANCETS MISC     . omeprazole (PRILOSEC) 40 MG capsule Take 40 mg by mouth 2 (two) times daily.     . ORAL ELECTROLYTES PO Take 6-8 tablets by mouth.    . oxyCODONE (ROXICODONE) 15 MG immediate release tablet continuous as needed.    Marland Kitchen PROAIR HFA 108 (90 BASE) MCG/ACT inhaler     . sertraline (ZOLOFT) 100 MG tablet Take 150 mg by mouth daily.     . sitaGLIPtin (JANUVIA) 100 MG tablet Take 1 tablet (100 mg total) by mouth daily. 30 tablet 3  . Sodium Chlorate POWD Take 240 mg by mouth as needed.    . Tamsulosin HCl (FLOMAX) 0.4 MG CAPS Take 0.4 mg by mouth 2 (two) times daily.     Marland Kitchen zonisamide (ZONEGRAN) 50 MG capsule Take 150 mg by mouth at bedtime.       No current facility-administered medications for this visit.    Past Medical History  Diagnosis Date  . DJD (degenerative joint disease)  L wrist  . Lumbar disc disease   . HTN (hypertension)   . Anemia     NOS  . Osteoporosis     tx per [facility], Dr Prudencio Burly, thought due to heavy steriod use after 1978  . Spine fracture     hx, multiple  . Blindness of left eye     near blindness. 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 GERD (gastroesophageal reflux disease)   . DM2 (diabetes mellitus, type 2)   . Anxiety   . Depression   . BPH (benign prostatic hypertrophy)   . HLD (hyperlipidemia)   . Chronic pain syndrome   . Chronic hyponatremia   . Diabetes mellitus   . Broken foot Oct. 12, 2013    Right foot Fx  . Varicose veins     Social History History  Substance Use Topics  . Smoking status: Former Smoker    Quit date: 11/06/1988  . Smokeless tobacco: Never Used  . Alcohol Use: No    Family  History Family History  Problem Relation Age of Onset  . Lung cancer Father     and grandmother  . Throat cancer      family hx - "bad living" - also lung CA, heart disease and renal failure    . Diabetes Mother     DM, also sister and MGF     Surgical History Past Surgical History  Procedure Laterality Date  . Right hand fracture  1969  . Left ankle ganglion cyst  1976  . Lumbar disc surgery  1981, 2004  . Nasal sinus surgery      multiple- x8. last 1997 with obliteration  . Left cataract  1996    right - 1997  . Left foot surgery  1998    R surgery/fracture - 1999  . Left cts  2006    R CTS - 2007  . Left wrist/hand fusion  2008  . L foot open repair jones fracture  2010     5th metetarsal   . Rotator cuff repair  2006    R, than repeat 2011, Dr Theda Sers  . Vocal surgery  1996  . Repair thoracic aorta    . Trigger finger release Left 11/11/2014    Procedure: LEFT LONG FINGER RELEASE TRIGGER FINGER/A-1 PULLEY;  Surgeon: Milly Jakob, MD;  Location: [facility];  Service: Orthopedics;  Laterality: Left;  Marland Kitchen Eye surgery      Allergies  Allergen Reactions  . Ivp Dye [Iodinated Diagnostic Agents]     Blood Pressure dropped----pt was pre-medicated with 13 hour prep and did fine with pre-meds--amy 03/09/11   . Penicillins Nausea Only    REACTION: gi upset  . Sulfonamide Derivatives Hives and Itching  . Propoxyphene N-Acetaminophen Other (See Comments)    Sharp pains- headache    Current Outpatient Prescriptions  Medication Sig Dispense Refill  . Blood Glucose Monitoring Suppl (BAYER BREEZE 2 SYSTEM) W/DEVICE KIT Use to check blood sugars once a day 1 kit 0  . buPROPion (WELLBUTRIN XL) 300 MG 24 hr tablet Take 300 mg by mouth every morning.     . celecoxib (CELEBREX) 200 MG capsule Take 200 mg by mouth 2 (two) times daily.   1  . Cholecalciferol (VITAMIN D) 1000 UNITS capsule Take 5,000 Units by mouth daily.     . clonazePAM (KLONOPIN) 1 MG tablet Take 1  mg by mouth 3 (three) times daily.     . cyclobenzaprine (FLEXERIL) 10 MG tablet  0  . fentaNYL (DURAGESIC - DOSED MCG/HR) 100 MCG/HR Place 100 mcg onto the skin every 3 (three) days.    . fludrocortisone (FLORINEF) 0.1 MG tablet Take 1 tablet every day 30 tablet 3  . Glucose Blood (BAYER BREEZE 2 TEST) DISK USE TO TEST BLOOD GLUCOSE THREE TIMES DAILY. Dx: E11.49 100 each 11  . L-Methylfolate-B6-B12 (VITACIRC-B) 3-35-2 MG TABS Take 1 tablet by mouth 2 (two) times daily.     Marland Kitchen losartan (COZAAR) 50 MG tablet   0  . MICROLET LANCETS MISC     . omeprazole (PRILOSEC) 40 MG capsule Take 40 mg by mouth 2 (two) times daily.     . ORAL ELECTROLYTES PO Take 6-8 tablets by mouth.    . oxyCODONE (ROXICODONE) 15 MG immediate release tablet continuous as needed.    Marland Kitchen PROAIR HFA 108 (90 BASE) MCG/ACT inhaler     . sertraline (ZOLOFT) 100 MG tablet Take 150 mg by mouth daily.     . sitaGLIPtin (JANUVIA) 100 MG tablet Take 1 tablet (100 mg total) by mouth daily. 30 tablet 3  . Sodium Chlorate POWD Take 240 mg by mouth as needed.    . Tamsulosin HCl (FLOMAX) 0.4 MG CAPS Take 0.4 mg by mouth 2 (two) times daily.     Marland Kitchen zonisamide (ZONEGRAN) 50 MG capsule Take 150 mg by mouth at bedtime.       No current facility-administered medications for this visit.     REVIEW OF SYSTEMS: See HPI for pertinent positives and negatives.  Physical Examination Filed Vitals:   12/02/14 1558 12/02/14 1604  BP: 116/64 84/59  Pulse: 61 65  Resp:  16  Height:  _0  (1.803 m)  Weight:  220 lb (99.791 kg)  SpO2:  95%   Body mass index is 30.7 kg/(m^2).  General:  WDWN in NAD Gait: Normal HENT: WNL Eyes: Pupils equal Pulmonary: normal non-labored breathing , without Rales, rhonchi,  wheezing Cardiac: RRR, no murmur detected  Abdomen: soft, NT, no masses palpated Skin: no rashes, no ulcers, no cellulitis.  VASCULAR EXAM  Carotid Bruits Right Left   Negative Negative     Radial pulses are 2+ palpable and  =. Aorta is not palpable.                         VASCULAR EXAM: Extremities without ischemic changes, No  Gangrene; without open wounds.                                                                                                          LE Pulses Right Left       FEMORAL  not palpable  not palpable        POPLITEAL  not palpable   not palpable       POSTERIOR TIBIAL   palpable    palpable        DORSALIS PEDIS      ANTERIOR TIBIAL  palpable   palpable     Musculoskeletal: no muscle wasting or  atrophy; no peripheral edema  Neurologic: A&O X 3; Appropriate Affect ;  SENSATION: normal; MOTOR FUNCTION: 5/5 Symmetric, CN 2-12 intact Speech is fluent/normal    Non-Invasive Vascular Imaging (12/02/2014):   CEREBROVASCULAR DUPLEX EVALUATION    INDICATION: Carotid artery stenosis    PREVIOUS INTERVENTION(S): Left carotid endarterectomy 04/01/2011    DUPLEX EXAM:     RIGHT  LEFT  Peak Systolic Velocities (cm/s) End Diastolic Velocities (cm/s) Plaque LOCATION Peak Systolic Velocities (cm/s) End Diastolic Velocities (cm/s) Plaque  138 12  CCA PROXIMAL 107 4   107 17  CCA MID 98 12   114 14 HT CCA DISTAL 73/109 9/12   108 13 HT ECA 230 4 HT  155 23 CP ICA PROXIMAL 98 17   116 28  ICA MID 90 19   99 24  ICA DISTAL 50 13     1.5 ICA / CCA Ratio (PSV) Carotid endarterectomy  Retrograde Vertebral Flow Antegrade  87 Brachial Systolic Pressure (mmHg) 355  Biphasic Brachial Artery Waveforms Triphasic    Plaque Morphology:  HM = Homogeneous, HT = Heterogeneous, CP = Calcific Plaque, SP = Smooth Plaque, IP = Irregular Plaque  ADDITIONAL FINDINGS:     IMPRESSION: Right internal carotid artery stenosis present of less than 40%. Left internal carotid artery is patent with history of carotid endarterectomy, no hyperplasia or hemodynamically significant plaque present. Left external carotid artery stenosis present. Brachial pressure gradient with right retrograde vertebral  artery suggestive of subclavian steal syndrome.    Compared to the previous exam:  Essentially unchanged since study on 11/26/2013.    ABI (Date: 12/02/2014)  R: 0.99 (11/26/13, 1.09), DP: biphasic, PT: triphasic, TBI: 0.99  L: 0.91 (1.04), DP: triphasic, PT: triphasic, TBI: 0.74   ASSESSMENT:  [name] is a 69 y.o. male who is status post aortobifemoral bypass graft as well as left carotid endarterectomy in 2012. He began complaining of leg swelling as well as prominent varicosities. He recently underwent laser ablation of the right small saphenous and left great saphenous vein for swelling and pain. He has done well from this procedure and has had significant improvement in his leg swelling.He c/o right sciatic pain in the last few months. He fell in February 2016, seems to have some weakness in his right leg that he attributes to lumbar spine issues; therefore he does not walk much. He denies non healing wounds. Pt c/o tingling in both hands, right worse than left, denies dizziness.  He had what sounds like a left occular stroke in 2009, no stroke or TIA activity since then.  Today's carotid Duplex suggests less than 40% right ICA stenosis. Left internal carotid artery is patent with history of carotid endarterectomy, no hyperplasia or hemodynamically significant plaque present. Left external carotid artery stenosis present. Brachial pressure gradient with right retrograde vertebral artery suggestive of subclavian steal syndrome.  ABI's and TBI's remain in the normal range, but left ABI is on the lower end of normal today, decreased from 1.09 to 0.91, likely due to less walking since he has some weakness in his right foot that facilitates falling; seated leg exercises advised in lieu of graduated walking program.  Face to face time with patient was 25 minutes. Over 50% of this time was spent on counseling and coordination of care.    PLAN:   Daily seated leg exercises as  discussed and demonstrated.  Based on today's exam and non-invasive vascular lab results, the patient will follow up in 1 year with carotid  Duplex and ABI's. I discussed in depth with the patient the nature of atherosclerosis, and emphasized the importance of maximal medical management including strict control of blood pressure, blood glucose, and lipid levels, obtaining regular exercise, and cessation of smoking.  The patient is aware that without maximal medical management the underlying atherosclerotic disease process will progress, limiting the benefit of any interventions.  The patient was given information about stroke prevention and what symptoms should prompt the patient to seek immediate medical care.  The patient was given information about PAD including signs, symptoms, treatment, what symptoms should prompt the patient to seek immediate medical care, and risk reduction measures to take. Thank you for allowing [place] to participate in this patient's care.  Clemon Chambers, RN, MSN, FNP-C [facility] Office: [phone]  Clinic MD: Trula Slade  12/02/2014 4:12 PM

## 2014-12-09 NOTE — Addendum Note (Signed)
Addended by: Mena Goes on: 12/09/2014 04:04 PM   Modules accepted: Orders

## 2014-12-12 ENCOUNTER — Other Ambulatory Visit: Payer: Self-pay | Admitting: Endocrinology

## 2014-12-12 DIAGNOSIS — M65332 Trigger finger, left middle finger: Secondary | ICD-10-CM | POA: Diagnosis not present

## 2014-12-12 DIAGNOSIS — M65331 Trigger finger, right middle finger: Secondary | ICD-10-CM | POA: Diagnosis not present

## 2014-12-19 DIAGNOSIS — Z79891 Long term (current) use of opiate analgesic: Secondary | ICD-10-CM | POA: Diagnosis not present

## 2014-12-19 DIAGNOSIS — M5136 Other intervertebral disc degeneration, lumbar region: Secondary | ICD-10-CM | POA: Diagnosis not present

## 2014-12-19 DIAGNOSIS — H5711 Ocular pain, right eye: Secondary | ICD-10-CM | POA: Diagnosis not present

## 2014-12-19 DIAGNOSIS — G894 Chronic pain syndrome: Secondary | ICD-10-CM | POA: Diagnosis not present

## 2014-12-27 ENCOUNTER — Other Ambulatory Visit: Payer: Self-pay | Admitting: *Deleted

## 2015-01-08 ENCOUNTER — Telehealth: Payer: Self-pay | Admitting: Endocrinology

## 2015-01-08 NOTE — Telephone Encounter (Signed)
He has not been able to get them through the other place he tried, He wants to use the company listed below.

## 2015-01-08 NOTE — Telephone Encounter (Signed)
I believe we have certified for diabetes shoes within the last year already.

## 2015-01-08 NOTE — Telephone Encounter (Signed)
[employer] requesting a rx for diabetic shoes  [phone]

## 2015-01-08 NOTE — Telephone Encounter (Signed)
Please see below and advise when ready.

## 2015-01-15 DIAGNOSIS — S0083XA Contusion of other part of head, initial encounter: Secondary | ICD-10-CM | POA: Diagnosis not present

## 2015-01-15 DIAGNOSIS — M62838 Other muscle spasm: Secondary | ICD-10-CM | POA: Diagnosis not present

## 2015-01-15 DIAGNOSIS — J449 Chronic obstructive pulmonary disease, unspecified: Secondary | ICD-10-CM | POA: Diagnosis not present

## 2015-01-15 NOTE — Telephone Encounter (Signed)
The need to send a form to complete for Medicare requirements and also need to review previous order to see what he had previously received

## 2015-01-22 ENCOUNTER — Other Ambulatory Visit: Payer: Self-pay | Admitting: Endocrinology

## 2015-02-10 ENCOUNTER — Telehealth: Payer: Self-pay | Admitting: Endocrinology

## 2015-02-10 NOTE — Telephone Encounter (Signed)
Diabetic shoes sorry

## 2015-02-10 NOTE — Telephone Encounter (Signed)
What rx?

## 2015-02-10 NOTE — Telephone Encounter (Signed)
I called the company back and asked them to refax the forms.

## 2015-02-10 NOTE — Telephone Encounter (Signed)
Pt stopped in to see if we have received the form and what the status for this rx is

## 2015-02-13 DIAGNOSIS — B370 Candidal stomatitis: Secondary | ICD-10-CM | POA: Diagnosis not present

## 2015-02-26 ENCOUNTER — Other Ambulatory Visit: Payer: Self-pay | Admitting: Endocrinology

## 2015-03-03 DIAGNOSIS — H5711 Ocular pain, right eye: Secondary | ICD-10-CM | POA: Diagnosis not present

## 2015-03-03 DIAGNOSIS — M5136 Other intervertebral disc degeneration, lumbar region: Secondary | ICD-10-CM | POA: Diagnosis not present

## 2015-03-03 DIAGNOSIS — Z79891 Long term (current) use of opiate analgesic: Secondary | ICD-10-CM | POA: Diagnosis not present

## 2015-03-03 DIAGNOSIS — G894 Chronic pain syndrome: Secondary | ICD-10-CM | POA: Diagnosis not present

## 2015-03-05 DIAGNOSIS — M65331 Trigger finger, right middle finger: Secondary | ICD-10-CM | POA: Diagnosis not present

## 2015-03-05 DIAGNOSIS — M65342 Trigger finger, left ring finger: Secondary | ICD-10-CM | POA: Diagnosis not present

## 2015-03-05 DIAGNOSIS — M65332 Trigger finger, left middle finger: Secondary | ICD-10-CM | POA: Diagnosis not present

## 2015-03-19 ENCOUNTER — Other Ambulatory Visit (INDEPENDENT_AMBULATORY_CARE_PROVIDER_SITE_OTHER): Payer: Medicare Other

## 2015-03-19 DIAGNOSIS — E119 Type 2 diabetes mellitus without complications: Secondary | ICD-10-CM

## 2015-03-19 LAB — HEMOGLOBIN A1C: Hgb A1c MFr Bld: 6.9 % — ABNORMAL HIGH (ref 4.6–6.5)

## 2015-03-19 LAB — BASIC METABOLIC PANEL
BUN: 13 mg/dL (ref 6–23)
CALCIUM: 8.8 mg/dL (ref 8.4–10.5)
CHLORIDE: 92 meq/L — AB (ref 96–112)
CO2: 30 mEq/L (ref 19–32)
CREATININE: 0.7 mg/dL (ref 0.40–1.50)
GFR: 118.86 mL/min (ref >60.00)
GLUCOSE: 130 mg/dL — AB (ref 70–99)
POTASSIUM: 4.5 meq/L (ref 3.5–5.1)
Sodium: 127 mEq/L — ABNORMAL LOW (ref 135–145)

## 2015-03-24 ENCOUNTER — Ambulatory Visit (INDEPENDENT_AMBULATORY_CARE_PROVIDER_SITE_OTHER): Payer: Medicare Other | Admitting: Endocrinology

## 2015-03-24 ENCOUNTER — Encounter: Payer: Self-pay | Admitting: Endocrinology

## 2015-03-24 VITALS — BP 146/68 | HR 75 | Temp 98.3°F | Resp 16 | Ht 73.0 in | Wt 222.0 lb

## 2015-03-24 DIAGNOSIS — E119 Type 2 diabetes mellitus without complications: Secondary | ICD-10-CM | POA: Diagnosis not present

## 2015-03-24 DIAGNOSIS — I10 Essential (primary) hypertension: Secondary | ICD-10-CM | POA: Diagnosis not present

## 2015-03-24 DIAGNOSIS — E871 Hypo-osmolality and hyponatremia: Secondary | ICD-10-CM | POA: Diagnosis not present

## 2015-03-24 DIAGNOSIS — I6523 Occlusion and stenosis of bilateral carotid arteries: Secondary | ICD-10-CM

## 2015-03-24 MED ORDER — LOSARTAN POTASSIUM 25 MG PO TABS: 25.0000 mg | ORAL_TABLET | Freq: Every day | ORAL | Status: DC

## 2015-03-24 NOTE — Progress Notes (Signed)
Patient ID: [name], male   DOB: [birth year], 69 y.o.   MRN: [id number]   Reason for Appointment: follow-up   History of Present Illness   1. HYPONATREMIA:  This is chronic and of unclear etiology He thinks that the condition was diagnosed even before he was on chronic narcotic medications or Zoloft Evaluation has usually showed a urine osmolality about the same as his serum osmolality He has been evaluated with thyroid functions and ACTH stim relation test which did not show any definite abnormality  He had tried taking salt tablets 6-8 daily and this has not had consistent benefit.  Still taking these now He does not think he is drinking excessive amounts of water He has been on Florinef once a day on his last visit but about 2-3 weeks ago because of leg swelling he stopped taking this He does complain of fatigue but no nausea or decreased appetite.    Sodium has been as low as 123 in 2015; however his not very symptomatic even at this point Urine osmolality last was  248   His sodium is the same at 127 now  He continues to take narcotic pain medications chronically and Zoloft   Lab Results  Component Value Date   CREATININE 0.70 03/19/2015   BUN 13 03/19/2015   NA 127* 03/19/2015   K 4.5 03/19/2015   CL 92* 03/19/2015   CO2 30 03/19/2015     2. Type 2 DIABETES MELITUS   He has had relatively mild diabetes which is usually well controlled with Januvia alone.    Previously was taking 1 mg Amaryl as needed for high readings which would cause  hypoglycemia at times Currently not taking Amaryl  Difficult to assess his home control as he does not bring his monitor for review His A1c is about the same , has been consistently just above normal   He is not able to do much physical activity  Monitors blood glucose:  less than one time every  day .    Glucometer: Breeze      Blood Glucose readings from review of monitor: readings before breakfast: Range 95-131,  average 112   Hypoglycemia frequency:  none        Meals: 1-2 meals per day.          Physical activity: exercise: Minimal, limited activity  Wt Readings from Last 3 Encounters:  03/24/15 222 lb (100.699 kg)  12/02/14 220 lb (99.791 kg)  11/20/14 215 lb 12.8 oz (97.886 kg)    Lab Results  Component Value Date   HGBA1C 6.9* 03/19/2015   HGBA1C 6.7* 11/15/2014   HGBA1C 6.6* 07/03/2014   Lab Results  Component Value Date   MICROALBUR <0.7 11/15/2014   [place] 98 11/15/2014   CREATININE 0.70 03/19/2015           Medication List       This list is accurate as of: 03/24/15  2:07 PM.  Always use your most recent med list.               BAYER BREEZE 2 SYSTEM W/DEVICE Kit  Use to check blood sugars once a day     BAYER BREEZE 2 TEST Disk  Generic drug:  Glucose Blood  USE TO TEST BLOOD GLUCOSE THREE TIMES DAILY     buPROPion 300 MG 24 hr tablet  Commonly known as:  WELLBUTRIN XL  Take 300 mg by mouth every morning.     celecoxib 200  MG capsule  Commonly known as:  CELEBREX  Take 200 mg by mouth 2 (two) times daily.     clonazePAM 1 MG tablet  Commonly known as:  KLONOPIN  Take 1 mg by mouth 3 (three) times daily.     cyclobenzaprine 10 MG tablet  Commonly known as:  FLEXERIL     fentaNYL 100 MCG/HR  Commonly known as:  DURAGESIC - dosed mcg/hr  Place 100 mcg onto the skin every 3 (three) days.     FLOMAX 0.4 MG Caps capsule  Generic drug:  tamsulosin  Take 0.4 mg by mouth 2 (two) times daily.     fludrocortisone 0.1 MG tablet  Commonly known as:  FLORINEF  Take 1 tablet every day     JANUVIA 100 MG tablet  Generic drug:  sitaGLIPtin  TAKE 1 TABLET BY MOUTH EVERY DAY     losartan 50 MG tablet  Commonly known as:  COZAAR     MICROLET LANCETS Misc     omeprazole 40 MG capsule  Commonly known as:  PRILOSEC  Take 40 mg by mouth 2 (two) times daily.     ORAL ELECTROLYTES PO  Take 6-8 tablets by mouth.     BUFFERED SALT Tabs  Take by mouth  daily. Therma Tabs     oxyCODONE 15 MG immediate release tablet  Commonly known as:  ROXICODONE  continuous as needed.     PROAIR HFA 108 (90 BASE) MCG/ACT inhaler  Generic drug:  albuterol     sertraline 100 MG tablet  Commonly known as:  ZOLOFT  Take 150 mg by mouth daily.     Sodium Chlorate Powd  Take 240 mg by mouth as needed.     VITACIRC-B 3-35-2 MG Tabs  Take 1 tablet by mouth 2 (two) times daily.     Vitamin D 1000 UNITS capsule  Take 5,000 Units by mouth daily.     zonisamide 50 MG capsule  Commonly known as:  ZONEGRAN  Take 150 mg by mouth at bedtime.        Allergies:  Allergies  Allergen Reactions  . Ivp Dye [Iodinated Diagnostic Agents]     Blood Pressure dropped----pt was pre-medicated with 13 hour prep and did fine with pre-meds--amy 03/09/11   . Penicillins Nausea Only    REACTION: gi upset  . Sulfonamide Derivatives Hives and Itching  . Propoxyphene N-Acetaminophen Other (See Comments)    Sharp pains- headache    Past Medical History  Diagnosis Date  . DJD (degenerative joint disease)     L wrist  . Lumbar disc disease   . HTN (hypertension)   . Anemia     NOS  . Osteoporosis     tx per [facility], Dr Prudencio Burly, thought due to heavy steriod use after 1978  . Spine fracture     hx, multiple  . Blindness of left eye     near blindness. 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 GERD (gastroesophageal reflux disease)   . DM2 (diabetes mellitus, type 2)   . Anxiety   . Depression   . BPH (benign prostatic hypertrophy)   . HLD (hyperlipidemia)   . Chronic pain syndrome   . Chronic hyponatremia   . Diabetes mellitus   . Broken foot Oct. 12, 2013    Right foot Fx  . Varicose veins     Past Surgical History  Procedure Laterality Date  . Right hand fracture  1969  . Left ankle ganglion cyst  1976  .  Lumbar disc surgery  1981, 2004  . Nasal sinus surgery      multiple- x8. last 1997 with obliteration  . Left cataract  1996    right - 1997  . Left foot surgery  1998      R surgery/fracture - 1999  . Left cts  2006    R CTS - 2007  . Left wrist/hand fusion  2008  . L foot open repair jones fracture  2010     5th metetarsal   . Rotator cuff repair  2006    R, than repeat 2011, Dr Theda Sers  . Vocal surgery  1996  . Repair thoracic aorta    . Trigger finger release Left 11/11/2014    Procedure: LEFT LONG FINGER RELEASE TRIGGER FINGER/A-1 PULLEY;  Surgeon: Milly Jakob, MD;  Location: [facility];  Service: Orthopedics;  Laterality: Left;  Marland Kitchen Eye surgery      Family History  Problem Relation Age of Onset  . Lung cancer Father     and grandmother  . Throat cancer      family hx - "bad living" - also lung CA, heart disease and renal failure    . Diabetes Mother     DM, also sister and MGF     Social History:  reports that he quit smoking about 26 years ago. He has never used smokeless tobacco. He reports that he does not drink alcohol or use illicit drugs.  Review of Systems:  HYPERTENSION: He has had only mild hypertension in the past.  He has been off all medications currently, losartan was stopped on his last visit because of rapidly low blood pressure He previously had tried Vasotec which probably caused cough    Blood pressure  is ASYMMETRICAL with much lower reading on the right side However has not had any evaluation done by his vascular surgeon, only has had lower extremity and carotid Dopplers  Lipids: LDL has been below 100 and has not been on any lipid-lowering drugs  Lab Results  Component Value Date   CHOL 155 11/15/2014   HDL 39.80 11/15/2014   LDLCALC 98 11/15/2014   LDLDIRECT 108.2 03/13/2010   TRIG 85.0 11/15/2014   CHOLHDL 4 11/15/2014     History of chronic depression treated by psychiatrist, currently on 150 mg of Zoloft  History of osteoporosis getting Reclast annually with the last infusion in 11/2013 The prior bone density showed T score -2.7  Recent bone density in 12/15 showed normal T score at  the spine, at the femoral neck was -1.7 on one side and -2.0 on the other Has no history of secondary causes like hypogonadism and his testosterone level is again normal Last fracture 10/13, foot, traumatic. He has had some falls recently without any fractures Vitamin D3 has been supplemented, has been taking   5000 units a day    Neuropathy: He has numbness in his feet, long term. Has been given diabetic shoes.  Foot exam done in 3/15  He has history of chronic myofascial pain of unclear etiology.  On fentanyl and  is also taking Oxycodone 76m, 2-4 times a day because of severe pain      Examination:   BP 146/68 mmHg  Pulse 75  Temp(Src) 98.3 F (36.8 C)  Resp 16  Ht _0  (1.854 m)  Wt 222 lb (100.699 kg)  BMI 29.30 kg/m2  SpO2 93%  Body mass index is 29.3 kg/(m^2).   Repeat blood pressure 90/62 on  the right.  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  He does try to add extra salt tablets and will continue this Since he is not symptomatic will not try Florinef again  Diabetes type 2   The patient's diabetes control appears to be  controlled with home readings mostly in the normal range at different times A1c near normal but slightly higher at 6.9 He has difficulty exercising because of his multiple medical issues He is doing fairly well with Januvia alone and can continue  HYPERTENSION:  His blood pressure is significantly higher than before even without taking Florinef recently He will start back on 25 mg of losartan at least  Also probably has subclavian  vascular issue on the right side and he will need to discuss this with his vascular surgeon  Osteoporosis with history of fractures: Currently asymptomatic.  Bone density has improved. He will need to be scheduled for Reclast; currently has normal renal function and calcium levels He needs to continue his calcium and vitamin D  LIPIDS: His LDL is below 100 on recent labs   Patient Instructions  Restart Losartan  Check blood sugars on waking up .Marland Kitchen2-3  .Marland Kitchen times a week Also check blood sugars about 2 hours after a meal and do this after different meals by rotation  Recommended blood sugar levels on waking up is 90-130 and about 2 hours after meal is 140-180 Please bring blood sugar monitor to each visit.      Palestine Mosco 03/24/2015, 2:07 PM   Labs:  Appointment on 03/19/2015  Component Date Value Ref Range Status  . Hgb A1c MFr Bld 03/19/2015 6.9* 4.6 - 6.5 % Final   Glycemic Control Guidelines for People with Diabetes:Non Diabetic:  <6%Goal of Therapy: <7%Additional Action Suggested:  >8%   . Sodium 03/19/2015 127* 135 - 145 mEq/L Final  . Potassium 03/19/2015 4.5  3.5 - 5.1 mEq/L Final  . Chloride 03/19/2015 92* 96 - 112 mEq/L Final  . CO2 03/19/2015 30  19 - 32 mEq/L Final  . Glucose, Bld 03/19/2015 130* 70 - 99 mg/dL Final  . BUN 03/19/2015 13  6 - 23 mg/dL Final  . Creatinine, Ser 03/19/2015 0.70  0.40 - 1.50 mg/dL Final  . Calcium 03/19/2015 8.8  8.4 - 10.5 mg/dL Final  . GFR 03/19/2015 118.86  >60.00 mL/min Final

## 2015-03-24 NOTE — Patient Instructions (Addendum)
Restart Losartan  Check blood sugars on waking up .Marland Kitchen2-3  .Marland Kitchen times a week Also check blood sugars about 2 hours after a meal and do this after different meals by rotation  Recommended blood sugar levels on waking up is 90-130 and about 2 hours after meal is 140-180 Please bring blood sugar monitor to each visit.

## 2015-04-28 ENCOUNTER — Ambulatory Visit: Payer: Medicare Other | Admitting: Surgery

## 2015-05-08 DIAGNOSIS — M542 Cervicalgia: Secondary | ICD-10-CM | POA: Diagnosis not present

## 2015-05-08 DIAGNOSIS — M25519 Pain in unspecified shoulder: Secondary | ICD-10-CM | POA: Diagnosis not present

## 2015-05-16 ENCOUNTER — Other Ambulatory Visit: Payer: Medicare Other

## 2015-05-16 ENCOUNTER — Telehealth: Payer: Self-pay | Admitting: Endocrinology

## 2015-05-16 NOTE — Telephone Encounter (Signed)
Noted  

## 2015-05-16 NOTE — Telephone Encounter (Signed)
Patient Name: [name] Gender: Male DOB: 07-Apr-1946 Age: 69 Y 1 M 6 D Return Phone Number: [phone] (Primary) Address: City/State/Zip: [place] Client [facility] Night - Client Client Site [facility] Physician Kumar, [place] Type Call Caller Name Chrishawn Slauter Phone Number [phone] Relationship To Patient Self Is this call to report lab results? No Call Type General Information Initial Comment Caller states he wants to cancel his appointment for tomorrow. General Information Type Appointment Nurse Assessment Guidelines Guideline Title Affirmed Question Affirmed Notes Nurse Date  I cancelled the appt

## 2015-05-26 ENCOUNTER — Ambulatory Visit: Payer: Medicare Other | Admitting: Endocrinology

## 2015-07-18 DIAGNOSIS — R42 Dizziness and giddiness: Secondary | ICD-10-CM | POA: Diagnosis not present

## 2015-07-22 ENCOUNTER — Other Ambulatory Visit: Payer: Self-pay | Admitting: *Deleted

## 2015-07-22 MED ORDER — SITAGLIPTIN PHOSPHATE 100 MG PO TABS: 100.0000 mg | ORAL_TABLET | Freq: Every day | ORAL | Status: DC

## 2015-08-13 ENCOUNTER — Other Ambulatory Visit: Payer: Self-pay | Admitting: *Deleted

## 2015-08-13 MED ORDER — LOSARTAN POTASSIUM 25 MG PO TABS: 25.0000 mg | ORAL_TABLET | Freq: Every day | ORAL | Status: DC

## 2015-08-17 HISTORY — PX: REPAIR THORACIC AORTA: SUR1211

## 2015-09-18 ENCOUNTER — Inpatient Hospital Stay (HOSPITAL_COMMUNITY)
Admission: EM | Admit: 2015-09-18 | Discharge: 2015-09-29 | DRG: 234 | Disposition: A | Discharge status: Home / Self Care | Payer: Medicare Other | Attending: Cardiothoracic Surgery | Admitting: Cardiothoracic Surgery

## 2015-09-18 ENCOUNTER — Encounter (HOSPITAL_COMMUNITY): Payer: Self-pay | Admitting: *Deleted

## 2015-09-18 DIAGNOSIS — Z882 Allergy status to sulfonamides status: Secondary | ICD-10-CM

## 2015-09-18 DIAGNOSIS — F329 Major depressive disorder, single episode, unspecified: Secondary | ICD-10-CM | POA: Diagnosis present

## 2015-09-18 DIAGNOSIS — I10 Essential (primary) hypertension: Secondary | ICD-10-CM | POA: Diagnosis present

## 2015-09-18 DIAGNOSIS — Z91041 Radiographic dye allergy status: Secondary | ICD-10-CM

## 2015-09-18 DIAGNOSIS — G894 Chronic pain syndrome: Secondary | ICD-10-CM | POA: Diagnosis not present

## 2015-09-18 DIAGNOSIS — F419 Anxiety disorder, unspecified: Secondary | ICD-10-CM | POA: Diagnosis present

## 2015-09-18 DIAGNOSIS — E871 Hypo-osmolality and hyponatremia: Secondary | ICD-10-CM | POA: Diagnosis present

## 2015-09-18 DIAGNOSIS — D62 Acute posthemorrhagic anemia: Secondary | ICD-10-CM | POA: Diagnosis not present

## 2015-09-18 DIAGNOSIS — Z886 Allergy status to analgesic agent status: Secondary | ICD-10-CM

## 2015-09-18 DIAGNOSIS — I2510 Atherosclerotic heart disease of native coronary artery without angina pectoris: Secondary | ICD-10-CM

## 2015-09-18 DIAGNOSIS — R338 Other retention of urine: Secondary | ICD-10-CM | POA: Diagnosis not present

## 2015-09-18 DIAGNOSIS — N401 Enlarged prostate with lower urinary tract symptoms: Secondary | ICD-10-CM | POA: Diagnosis present

## 2015-09-18 DIAGNOSIS — Z7982 Long term (current) use of aspirin: Secondary | ICD-10-CM

## 2015-09-18 DIAGNOSIS — I200 Unstable angina: Secondary | ICD-10-CM | POA: Diagnosis present

## 2015-09-18 DIAGNOSIS — H540 Blindness, both eyes: Secondary | ICD-10-CM | POA: Diagnosis present

## 2015-09-18 DIAGNOSIS — E1151 Type 2 diabetes mellitus with diabetic peripheral angiopathy without gangrene: Secondary | ICD-10-CM | POA: Diagnosis present

## 2015-09-18 DIAGNOSIS — Z9689 Presence of other specified functional implants: Secondary | ICD-10-CM

## 2015-09-18 DIAGNOSIS — Z09 Encounter for follow-up examination after completed treatment for conditions other than malignant neoplasm: Secondary | ICD-10-CM

## 2015-09-18 DIAGNOSIS — I708 Atherosclerosis of other arteries: Secondary | ICD-10-CM | POA: Diagnosis present

## 2015-09-18 DIAGNOSIS — I4891 Unspecified atrial fibrillation: Secondary | ICD-10-CM | POA: Diagnosis not present

## 2015-09-18 DIAGNOSIS — E559 Vitamin D deficiency, unspecified: Secondary | ICD-10-CM | POA: Diagnosis present

## 2015-09-18 DIAGNOSIS — M810 Age-related osteoporosis without current pathological fracture: Secondary | ICD-10-CM | POA: Diagnosis present

## 2015-09-18 DIAGNOSIS — F1123 Opioid dependence with withdrawal: Secondary | ICD-10-CM | POA: Diagnosis present

## 2015-09-18 DIAGNOSIS — Z8673 Personal history of transient ischemic attack (TIA), and cerebral infarction without residual deficits: Secondary | ICD-10-CM

## 2015-09-18 DIAGNOSIS — R079 Chest pain, unspecified: Secondary | ICD-10-CM | POA: Diagnosis not present

## 2015-09-18 DIAGNOSIS — Z880 Allergy status to penicillin: Secondary | ICD-10-CM

## 2015-09-18 DIAGNOSIS — I25110 Atherosclerotic heart disease of native coronary artery with unstable angina pectoris: Principal | ICD-10-CM | POA: Diagnosis present

## 2015-09-18 DIAGNOSIS — Z79899 Other long term (current) drug therapy: Secondary | ICD-10-CM

## 2015-09-18 DIAGNOSIS — Z951 Presence of aortocoronary bypass graft: Secondary | ICD-10-CM

## 2015-09-18 DIAGNOSIS — I771 Stricture of artery: Secondary | ICD-10-CM | POA: Diagnosis present

## 2015-09-18 DIAGNOSIS — Z01818 Encounter for other preprocedural examination: Secondary | ICD-10-CM

## 2015-09-18 DIAGNOSIS — Z87891 Personal history of nicotine dependence: Secondary | ICD-10-CM

## 2015-09-18 DIAGNOSIS — E1142 Type 2 diabetes mellitus with diabetic polyneuropathy: Secondary | ICD-10-CM | POA: Diagnosis present

## 2015-09-18 DIAGNOSIS — E785 Hyperlipidemia, unspecified: Secondary | ICD-10-CM | POA: Diagnosis present

## 2015-09-18 DIAGNOSIS — E0840 Diabetes mellitus due to underlying condition with diabetic neuropathy, unspecified: Secondary | ICD-10-CM | POA: Diagnosis present

## 2015-09-18 DIAGNOSIS — K219 Gastro-esophageal reflux disease without esophagitis: Secondary | ICD-10-CM | POA: Diagnosis present

## 2015-09-18 LAB — TROPONIN I: Troponin I: 0.03 ng/mL (ref <0.031)

## 2015-09-18 LAB — BASIC METABOLIC PANEL
Anion gap: 9 (ref 5–15)
BUN: 7 mg/dL (ref 6–20)
CHLORIDE: 94 mmol/L — AB (ref 101–111)
CO2: 28 mmol/L (ref 22–32)
CREATININE: 0.76 mg/dL (ref 0.61–1.24)
Calcium: 9.4 mg/dL (ref 8.9–10.3)
GFR calc Af Amer: >60 mL/min (ref >60)
GFR calc non Af Amer: >60 mL/min (ref >60)
GLUCOSE: 163 mg/dL — AB (ref 65–99)
POTASSIUM: 4.7 mmol/L (ref 3.5–5.1)
Sodium: 131 mmol/L — ABNORMAL LOW (ref 135–145)

## 2015-09-18 LAB — CBC
HEMATOCRIT: 36.4 % — AB (ref 39.0–52.0)
HEMATOCRIT: 36.8 % — AB (ref 39.0–52.0)
HEMOGLOBIN: 12.6 g/dL — AB (ref 13.0–17.0)
Hemoglobin: 12.4 g/dL — ABNORMAL LOW (ref 13.0–17.0)
MCH: 29.3 pg (ref 26.0–34.0)
MCH: 29.1 pg (ref 26.0–34.0)
MCHC: 34.1 g/dL (ref 30.0–36.0)
MCHC: 34.2 g/dL (ref 30.0–36.0)
MCV: 86.1 fL (ref 78.0–100.0)
MCV: 85 fL (ref 78.0–100.0)
Platelets: 252 10*3/uL (ref 150–400)
Platelets: 256 10*3/uL (ref 150–400)
RBC: 4.23 MIL/uL (ref 4.22–5.81)
RBC: 4.33 MIL/uL (ref 4.22–5.81)
RDW: 13.8 % (ref 11.5–15.5)
RDW: 13.7 % (ref 11.5–15.5)
WBC: 4.3 10*3/uL (ref 4.0–10.5)
WBC: 4.5 10*3/uL (ref 4.0–10.5)

## 2015-09-18 LAB — CREATININE, SERUM
Creatinine, Ser: 0.74 mg/dL (ref 0.61–1.24)
GFR calc Af Amer: >60 mL/min (ref >60)
GFR calc non Af Amer: >60 mL/min (ref >60)

## 2015-09-18 LAB — I-STAT TROPONIN, ED: Troponin i, poc: 0 ng/mL (ref 0.00–0.08)

## 2015-09-18 LAB — GLUCOSE, CAPILLARY
GLUCOSE-CAPILLARY: 129 mg/dL — AB (ref 65–99)
Glucose-Capillary: 88 mg/dL (ref 65–99)

## 2015-09-18 MED ORDER — ZONISAMIDE 25 MG PO CAPS
150.0000 mg | ORAL_CAPSULE | Freq: Every day | ORAL | Status: DC
  Administered 2015-09-18 – 2015-09-21 (×4): 150 mg via ORAL
  Filled 2015-09-18 (×5): qty 6

## 2015-09-18 MED ORDER — PANTOPRAZOLE SODIUM 40 MG PO TBEC
40.0000 mg | DELAYED_RELEASE_TABLET | Freq: Every day | ORAL | Status: DC
  Administered 2015-09-19 – 2015-09-21 (×3): 40 mg via ORAL
  Filled 2015-09-18 (×3): qty 1

## 2015-09-18 MED ORDER — INSULIN ASPART 100 UNIT/ML ~~LOC~~ SOLN
0.0000 [IU] | Freq: Three times a day (TID) | SUBCUTANEOUS | Status: DC
  Administered 2015-09-20 – 2015-09-21 (×4): 2 [IU] via SUBCUTANEOUS

## 2015-09-18 MED ORDER — GI COCKTAIL ~~LOC~~: 30.0000 mL | Freq: Four times a day (QID) | ORAL | Status: DC | PRN

## 2015-09-18 MED ORDER — BUPROPION HCL ER (XL) 300 MG PO TB24
300.0000 mg | ORAL_TABLET | ORAL | Status: DC
  Administered 2015-09-19 – 2015-09-29 (×10): 300 mg via ORAL
  Filled 2015-09-18: qty 2
  Filled 2015-09-18: qty 1
  Filled 2015-09-18 (×2): qty 2
  Filled 2015-09-18: qty 1
  Filled 2015-09-18: qty 2
  Filled 2015-09-18: qty 1
  Filled 2015-09-18: qty 2
  Filled 2015-09-18 (×2): qty 1
  Filled 2015-09-18: qty 2
  Filled 2015-09-18: qty 1

## 2015-09-18 MED ORDER — FENTANYL 100 MCG/HR TD PT72
100.0000 ug | MEDICATED_PATCH | TRANSDERMAL | Status: DC
  Administered 2015-09-20: 100 ug via TRANSDERMAL
  Filled 2015-09-18: qty 1

## 2015-09-18 MED ORDER — CYCLOBENZAPRINE HCL 10 MG PO TABS: 10.0000 mg | ORAL_TABLET | Freq: Three times a day (TID) | ORAL | Status: DC | PRN

## 2015-09-18 MED ORDER — SERTRALINE HCL 50 MG PO TABS
150.0000 mg | ORAL_TABLET | Freq: Every day | ORAL | Status: DC
Start: 2015-09-18 — End: 2015-09-19
  Filled 2015-09-18 (×2): qty 1

## 2015-09-18 MED ORDER — ONDANSETRON HCL 4 MG/2ML IJ SOLN: 4.0000 mg | Freq: Four times a day (QID) | INTRAMUSCULAR | Status: DC | PRN

## 2015-09-18 MED ORDER — HEPARIN SODIUM (PORCINE) 5000 UNIT/ML IJ SOLN
5000.0000 [IU] | Freq: Three times a day (TID) | INTRAMUSCULAR | Status: DC
  Administered 2015-09-18 – 2015-09-19 (×2): 5000 [IU] via SUBCUTANEOUS
  Filled 2015-09-18 (×2): qty 1

## 2015-09-18 MED ORDER — LOSARTAN POTASSIUM 25 MG PO TABS
25.0000 mg | ORAL_TABLET | Freq: Every day | ORAL | Status: DC
  Administered 2015-09-18 – 2015-09-21 (×4): 25 mg via ORAL
  Filled 2015-09-18 (×4): qty 1

## 2015-09-18 MED ORDER — INSULIN DETEMIR 100 UNIT/ML ~~LOC~~ SOLN
5.0000 [IU] | Freq: Every day | SUBCUTANEOUS | Status: DC
  Administered 2015-09-18 – 2015-09-21 (×4): 5 [IU] via SUBCUTANEOUS
  Filled 2015-09-18 (×4): qty 0.05

## 2015-09-18 MED ORDER — ASPIRIN EC 325 MG PO TBEC
325.0000 mg | DELAYED_RELEASE_TABLET | Freq: Every day | ORAL | Status: DC
  Administered 2015-09-18 – 2015-09-21 (×4): 325 mg via ORAL
  Filled 2015-09-18 (×4): qty 1

## 2015-09-18 MED ORDER — CLONAZEPAM 0.5 MG PO TABS
1.0000 mg | ORAL_TABLET | Freq: Three times a day (TID) | ORAL | Status: DC
  Administered 2015-09-18 – 2015-09-21 (×9): 1 mg via ORAL
  Filled 2015-09-18 (×9): qty 2

## 2015-09-18 MED ORDER — ALBUTEROL (5 MG/ML) CONTINUOUS INHALATION SOLN
2.5000 mg | INHALATION_SOLUTION | RESPIRATORY_TRACT | Status: DC | PRN
  Filled 2015-09-18: qty 20

## 2015-09-18 MED ORDER — INSULIN ASPART 100 UNIT/ML ~~LOC~~ SOLN
0.0000 [IU] | Freq: Every day | SUBCUTANEOUS | Status: DC
  Administered 2015-09-19: 4 [IU] via SUBCUTANEOUS

## 2015-09-18 MED ORDER — ACETAMINOPHEN 325 MG PO TABS
650.0000 mg | ORAL_TABLET | ORAL | Status: DC | PRN
  Administered 2015-09-20: 650 mg via ORAL
  Filled 2015-09-18: qty 2

## 2015-09-18 MED ORDER — INSULIN ASPART 100 UNIT/ML ~~LOC~~ SOLN
4.0000 [IU] | Freq: Three times a day (TID) | SUBCUTANEOUS | Status: DC
  Administered 2015-09-20 – 2015-09-21 (×6): 4 [IU] via SUBCUTANEOUS

## 2015-09-18 MED ORDER — MORPHINE SULFATE (PF) 2 MG/ML IV SOLN
2.0000 mg | INTRAVENOUS | Status: DC | PRN
  Administered 2015-09-19 – 2015-09-21 (×2): 2 mg via INTRAVENOUS
  Filled 2015-09-18 (×3): qty 1

## 2015-09-18 MED ORDER — TAMSULOSIN HCL 0.4 MG PO CAPS
0.4000 mg | ORAL_CAPSULE | Freq: Two times a day (BID) | ORAL | Status: DC
  Administered 2015-09-19 – 2015-09-21 (×6): 0.4 mg via ORAL
  Filled 2015-09-18 (×6): qty 1

## 2015-09-18 MED ORDER — ALBUTEROL SULFATE (2.5 MG/3ML) 0.083% IN NEBU: 2.5000 mg | INHALATION_SOLUTION | RESPIRATORY_TRACT | Status: DC | PRN

## 2015-09-18 MED ORDER — OXYCODONE HCL 5 MG PO TABS
15.0000 mg | ORAL_TABLET | ORAL | Status: DC | PRN
  Administered 2015-09-18 – 2015-09-21 (×11): 15 mg via ORAL
  Filled 2015-09-18 (×12): qty 3

## 2015-09-18 MED ORDER — VITAMIN D 1000 UNITS PO TABS
5000.0000 [IU] | ORAL_TABLET | Freq: Every day | ORAL | Status: DC
  Administered 2015-09-20 – 2015-09-21 (×2): 5000 [IU] via ORAL
  Filled 2015-09-18 (×5): qty 5

## 2015-09-18 MED ORDER — ROSUVASTATIN CALCIUM 10 MG PO TABS: 5.0000 mg | ORAL_TABLET | Freq: Every day | ORAL | Status: DC

## 2015-09-18 NOTE — ED Notes (Signed)
Pt states that he has had chest pain for months. States he didn't think anything of it but when he went to his MD today they sent him here. Has hx aorta replacement.

## 2015-09-18 NOTE — ED Provider Notes (Signed)
CSN: [id number]     Arrival date & time 09/18/15  1553 History   First MD Initiated Contact with Patient 09/18/15 1651     Chief Complaint  Patient presents with  . Chest Pain    Patient is a 70 y.o. male presenting with chest pain. The history is provided by the patient.  Chest Pain Pain location:  R chest Pain quality: dull   Pain radiates to:  R shoulder Pain severity:  Moderate Onset quality:  Gradual Duration: several minutes. Timing:  Intermittent Progression:  Resolved Chronicity:  New Relieved by:  Rest Worsened by:  Exertion Associated symptoms: diaphoresis and shortness of breath   Associated symptoms: no abdominal pain, no fever, no syncope, not vomiting and no weakness   Risk factors: male sex   Pt reports for past month, he has had intermittent episodes of Chest pain described as dull ache He reports at times it is triggered by exertion Today he some CP while at rest He is now CP free He went to PCP office, reports he had CXR that was normal, and sent for further evaluation He took an ASA today  He denies h/o CAD, but admit to previous h/o abdominal aorta repair  He reports he takes fentanyl for chronic pain  Past Medical History  Diagnosis Date  . DJD (degenerative joint disease)     L wrist  . Lumbar disc disease   . HTN (hypertension)   . Anemia     NOS  . Osteoporosis     tx per [facility], Dr Prudencio Burly, thought due to heavy steriod use after 1978  . Spine fracture     hx, multiple  . Blindness of left eye     near blindness. 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 GERD (gastroesophageal reflux disease)   . DM2 (diabetes mellitus, type 2) ([place])   . Anxiety   . Depression   . BPH (benign prostatic hypertrophy)   . HLD (hyperlipidemia)   . Chronic pain syndrome   . Chronic hyponatremia   . Diabetes mellitus   . Broken foot Oct. 12, 2013    Right foot Fx  . Varicose veins    Past Surgical History  Procedure Laterality Date  . Right hand fracture  1969  . Left ankle  ganglion cyst  1976  . Lumbar disc surgery  1981, 2004  . Nasal sinus surgery      multiple- x8. last 1997 with obliteration  . Left cataract  1996    right - 1997  . Left foot surgery  1998    R surgery/fracture - 1999  . Left cts  2006    R CTS - 2007  . Left wrist/hand fusion  2008  . L foot open repair jones fracture  2010     5th metetarsal   . Rotator cuff repair  2006    R, than repeat 2011, Dr Theda Sers  . Vocal surgery  1996  . Repair thoracic aorta    . Trigger finger release Left 11/11/2014    Procedure: LEFT LONG FINGER RELEASE TRIGGER FINGER/A-1 PULLEY;  Surgeon: Milly Jakob, MD;  Location: [facility];  Service: Orthopedics;  Laterality: Left;  Marland Kitchen Eye surgery     Family History  Problem Relation Age of Onset  . Lung cancer Father     and grandmother  . Throat cancer      family hx - "bad living" - also lung CA, heart disease and renal failure    .  Diabetes Mother     DM, also sister and MGF    Social History  Substance Use Topics  . Smoking status: Former Smoker    Quit date: 11/06/1988  . Smokeless tobacco: Never Used  . Alcohol Use: No    Review of Systems  Constitutional: Positive for diaphoresis. Negative for fever.  Respiratory: Positive for shortness of breath.   Cardiovascular: Positive for chest pain. Negative for syncope.  Gastrointestinal: Negative for vomiting and abdominal pain.  Neurological: Negative for syncope and weakness.  All other systems reviewed and are negative.     Allergies  Ivp dye; Penicillins; Sulfonamide derivatives; and Propoxyphene n-acetaminophen  Home Medications   Prior to Admission medications   Medication Sig Start Date End Date Taking? Authorizing Provider  BAYER BREEZE 2 TEST DISK USE TO TEST BLOOD GLUCOSE THREE TIMES DAILY 02/26/15   Elayne Snare, MD  Blood Glucose Monitoring Suppl (BAYER BREEZE 2 SYSTEM) W/DEVICE KIT Use to check blood sugars once a day 04/05/13   Elayne Snare, MD  buPROPion  (WELLBUTRIN XL) 300 MG 24 hr tablet Take 300 mg by mouth every morning.  10/12/11   Historical Provider, MD  celecoxib (CELEBREX) 200 MG capsule Take 200 mg by mouth 2 (two) times daily.  06/24/14   Historical Provider, MD  Cholecalciferol (VITAMIN D) 1000 UNITS capsule Take 5,000 Units by mouth daily.     Historical Provider, MD  clonazePAM (KLONOPIN) 1 MG tablet Take 1 mg by mouth 3 (three) times daily.     Historical Provider, MD  cyclobenzaprine (FLEXERIL) 10 MG tablet  05/15/14   Historical Provider, MD  fentaNYL (DURAGESIC - DOSED MCG/HR) 100 MCG/HR Place 100 mcg onto the skin every 3 (three) days.    Historical Provider, MD  fludrocortisone (FLORINEF) 0.1 MG tablet Take 1 tablet every day Patient not taking: Reported on 03/24/2015 08/19/14   Elayne Snare, MD  L-Methylfolate-B6-B12 (VITACIRC-B) 3-35-2 MG TABS Take 1 tablet by mouth 2 (two) times daily.     Historical Provider, MD  losartan (COZAAR) 25 MG tablet Take 1 tablet (25 mg total) by mouth daily. 08/13/15   Elayne Snare, MD  [place]  10/22/12   Historical Provider, MD  omeprazole (PRILOSEC) 40 MG capsule Take 40 mg by mouth 2 (two) times daily.     Historical Provider, MD  Oral Electrolytes (BUFFERED SALT) TABS Take by mouth daily. Therma Tabs    Historical Provider, MD  ORAL ELECTROLYTES PO Take 6-8 tablets by mouth.    Historical Provider, MD  oxyCODONE (ROXICODONE) 15 MG immediate release tablet continuous as needed. 01/27/13   Historical Provider, MD  PROAIR HFA 108 (90 BASE) MCG/ACT inhaler  03/12/14   Historical Provider, MD  sertraline (ZOLOFT) 100 MG tablet Take 150 mg by mouth daily.  10/13/11   Historical Provider, MD  sitaGLIPtin (JANUVIA) 100 MG tablet Take 1 tablet (100 mg total) by mouth daily. 07/22/15   Dixie Dials, MD  Sodium Chlorate POWD Take 240 mg by mouth as needed.    Historical Provider, MD  Tamsulosin HCl (FLOMAX) 0.4 MG CAPS Take 0.4 mg by mouth 2 (two) times daily.     Historical Provider, MD  zonisamide  (ZONEGRAN) 50 MG capsule Take 150 mg by mouth at bedtime.      Historical Provider, MD   BP 119/80 mmHg  Pulse 56  Temp(Src) 97.8 F (36.6 C) (Oral)  Resp 10  Ht 6' (1.829 m)  Wt 98.793 kg  BMI 29.53 kg/m2  SpO2  95% Physical Exam CONSTITUTIONAL: Well developed/well nourished, appears mildly sedated HEAD: Normocephalic/atraumatic EYES: EOMI/PERRL ENMT: Mucous membranes moist NECK: supple no meningeal signs SPINE/BACK:entire spine nontender CV: S1/S2 noted, no murmurs/rubs/gallops noted LUNGS: Lungs are clear to auscultation bilaterally, no apparent distress ABDOMEN: soft, nontender NEURO: Pt is awake/alert/appropriate, moves all extremitiesx4.  No facial droop.   EXTREMITIES: pulses normal/equalx4, full ROM SKIN: warm, color normal PSYCH: no abnormalities of mood noted, alert and oriented to situation  ED Course  Procedures   5:38 PM Pt with reported increasing episodes of CP He is CP free at this time He denies pleuritic pain He has multiple risk factors for CAD including HTN/DM and vascular disease He is high risk and should be admitted for ACS rule out He is stable at this time I have low suspicion for PE/Dissection at this time D/w dr Venetia Constable, will admit to triad, tele floor  Labs Review Labs Reviewed  BASIC METABOLIC PANEL - Abnormal; Notable for the following:    Sodium 131 (*)    Chloride 94 (*)    Glucose, Bld 163 (*)    All other components within normal limits  CBC - Abnormal; Notable for the following:    Hemoglobin 12.4 (*)    HCT 36.4 (*)    All other components within normal limits  I-STAT TROPOININ, ED    I have personally reviewed and evaluated these  lab results as part of my medical decision-making.   EKG Interpretation   Date/Time:  Thursday September 18 2015 15:58:58 EST Ventricular Rate:  71 PR Interval:  156 QRS Duration: 106 QT Interval:  392 QTC Calculation: 425 R Axis:   60 Text Interpretation:  Normal sinus rhythm Normal ECG No  significant change  since last tracing Confirmed by Christy Gentles  MD, Elenore Rota ([id number]) on 09/18/2015  4:52:19 PM      MDM   Final diagnoses:  Chest pain, rule out acute myocardial infarction    Nursing notes including past medical history and social history reviewed and considered in documentation Labs/vital reviewed myself and considered during evaluation     Ripley Fraise, MD 09/18/15 1740

## 2015-09-18 NOTE — ED Notes (Signed)
Admitting at bedside 

## 2015-09-18 NOTE — H&P (Signed)
[facility] History and Physical  [name] IFO:[id number] DOB: Oct 28, 1945 DOA: 09/18/2015  Referring physician: Dr. Christy [name] PCP:  Anthony Crutch, MD   Chief Complaint: chest pain  HPI: [name] is a 70 y.o. male past medical history of diabetes mellitus type 2 with last A1c of 6.9, chronic hyponatremia, history of tobacco abuse, essential hypertension, peripheral vascular disease that comes in for typical chest pain that started about a month ago. He relates that every time he tries to mow the lawn or goes for a walk he chest pain that improved after 5 minutes of sitting down. With this he also gets shortness of breath and profuse sweating. He relates sometimes the pain radiates to his left shoulder when he develops the chest pain. But denies any nausea vomiting. He went to see his primary care doctor on the day of admission referred him to the ED. Chest x-ray was done in the PCPs office that shows no acute finding.  In the ED First set of cardiac enzymes is negative he is chest pain-free EKG has been low sodium of 31 creatinine at baseline.  Review of Systems:  Constitutional:  No weight loss, night sweats, Fevers, chills, fatigue.  HEENT:  No headaches, Difficulty swallowing,Tooth/dental problems,Sore throat,  No sneezing, itching, ear ache, nasal congestion, post nasal drip,  Cardio-vascular:  Anasarca, dizziness, palpitations  GI:  No heartburn, indigestion, abdominal pain, nausea, vomiting, diarrhea, change in bowel habits, loss of appetite  Resp:  No excess mucus, no productive cough, No non-productive cough, No coughing up of blood.No change in color of mucus.No wheezing.No chest wall deformity  Skin:  no rash or lesions.  GU:  no dysuria, change in color of urine, no urgency or frequency. No flank pain.  Musculoskeletal:  No joint pain or swelling. No decreased range of motion. No back pain.  Psych:  No change in mood or affect. No depression or anxiety. No memory  loss.   Past Medical History  Diagnosis Date  . DJD (degenerative joint disease)     L wrist  . Lumbar disc disease   . HTN (hypertension)   . Anemia     NOS  . Osteoporosis     tx per [facility], [name], thought due to heavy steriod use after 1978  . Spine fracture     hx, multiple  . Blindness of left eye     near blindness. 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 GERD (gastroesophageal reflux disease)   . DM2 (diabetes mellitus, type 2) ([place])   . Anxiety   . Depression   . BPH (benign prostatic hypertrophy)   . HLD (hyperlipidemia)   . Chronic pain syndrome   . Chronic hyponatremia   . Diabetes mellitus   . Broken foot Oct. 12, 2013    Right foot Fx  . Varicose veins    Past Surgical History  Procedure Laterality Date  . Right hand fracture  1969  . Left ankle ganglion cyst  1976  . Lumbar disc surgery  1981, 2004  . Nasal sinus surgery      multiple- x8. last 1997 with obliteration  . Left cataract  1996    right - 1997  . Left foot surgery  1998    R surgery/fracture - 1999  . Left cts  2006    R CTS - 2007  . Left wrist/hand fusion  2008  . L foot open repair jones fracture  2010     5th metetarsal   . Rotator  cuff repair  2006    R, than repeat 2011, [name]  . Vocal surgery  1996  . Repair thoracic aorta    . Trigger finger release Left 11/11/2014    Procedure: LEFT LONG FINGER RELEASE TRIGGER FINGER/A-1 PULLEY;  Surgeon: Anthony Jakob, MD;  Location: [facility];  Service: Orthopedics;  Laterality: Left;  Marland Kitchen Eye surgery     Social History:  reports that he quit smoking about 26 years ago. His smoking use included Cigarettes. He smoked 2.00 packs per day. He has never used smokeless tobacco. He reports that he does not drink alcohol or use illicit drugs.  Allergies  Allergen Reactions  . Ivp Dye [Iodinated Diagnostic Agents]     Blood Pressure dropped----pt was pre-medicated with 13 hour prep and did fine with pre-meds--amy 03/09/11   . Penicillins Nausea Only      REACTION: gi upset  . Sulfonamide Derivatives Hives and Itching  . Propoxyphene N-Acetaminophen Other (See Comments)    Sharp pains- headache    Family History  Problem Relation Age of Onset  . Lung cancer Father     and grandmother  . Throat cancer      family hx - "bad living" - also lung CA, heart disease and renal failure    . Diabetes Mother     DM, also sister and MGF     Prior to Admission medications   Medication Sig Start Date End Date Taking? Authorizing Provider  BAYER BREEZE 2 TEST DISK USE TO TEST BLOOD GLUCOSE THREE TIMES DAILY 02/26/15   Anthony Snare, MD  Blood Glucose Monitoring Suppl (BAYER BREEZE 2 SYSTEM) W/DEVICE KIT Use to check blood sugars once a day 04/05/13   Anthony Snare, MD  buPROPion (WELLBUTRIN XL) 300 MG 24 hr tablet Take 300 mg by mouth every morning.  10/12/11   Historical Provider, MD  celecoxib (CELEBREX) 200 MG capsule Take 200 mg by mouth 2 (two) times daily.  06/24/14   Historical Provider, MD  Cholecalciferol (VITAMIN D) 1000 UNITS capsule Take 5,000 Units by mouth daily.     Historical Provider, MD  clonazePAM (KLONOPIN) 1 MG tablet Take 1 mg by mouth 3 (three) times daily.     Historical Provider, MD  cyclobenzaprine (FLEXERIL) 10 MG tablet  05/15/14   Historical Provider, MD  fentaNYL (DURAGESIC - DOSED MCG/HR) 100 MCG/HR Place 100 mcg onto the skin every 3 (three) days.    Historical Provider, MD  fludrocortisone (FLORINEF) 0.1 MG tablet Take 1 tablet every day Patient not taking: Reported on 03/24/2015 08/19/14   Anthony Snare, MD  L-Methylfolate-B6-B12 (VITACIRC-B) 3-35-2 MG TABS Take 1 tablet by mouth 2 (two) times daily.     Historical Provider, MD  losartan (COZAAR) 25 MG tablet Take 1 tablet (25 mg total) by mouth daily. 08/13/15   Anthony Snare, MD  [place]  10/22/12   Historical Provider, MD  omeprazole (PRILOSEC) 40 MG capsule Take 40 mg by mouth 2 (two) times daily.     Historical Provider, MD  Oral Electrolytes (BUFFERED SALT) TABS  Take by mouth daily. Therma Tabs    Historical Provider, MD  ORAL ELECTROLYTES PO Take 6-8 tablets by mouth.    Historical Provider, MD  oxyCODONE (ROXICODONE) 15 MG immediate release tablet continuous as needed. 01/27/13   Historical Provider, MD  PROAIR HFA 108 (90 BASE) MCG/ACT inhaler  03/12/14   Historical Provider, MD  sertraline (ZOLOFT) 100 MG tablet Take 150 mg by mouth daily.  10/13/11   Historical Provider, MD  sitaGLIPtin (JANUVIA) 100 MG tablet Take 1 tablet (100 mg total) by mouth daily. 07/22/15   Dixie Dials, MD  Sodium Chlorate POWD Take 240 mg by mouth as needed.    Historical Provider, MD  Tamsulosin HCl (FLOMAX) 0.4 MG CAPS Take 0.4 mg by mouth 2 (two) times daily.     Historical Provider, MD  zonisamide (ZONEGRAN) 50 MG capsule Take 150 mg by mouth at bedtime.      Historical Provider, MD   Physical Exam: Filed Vitals:   09/18/15 1602 09/18/15 1715 09/18/15 1730  BP: 133/79 119/80 104/71  Pulse: 61 56 53  Temp: 97.8 F (36.6 C)    TempSrc: Oral    Resp: 18 10 12   Height: 6' (1.829 m)    Weight: 98.793 kg (217 lb 12.8 oz)    SpO2: 100% 95% 94%    Wt Readings from Last 3 Encounters:  09/18/15 98.793 kg (217 lb 12.8 oz)  03/24/15 100.699 kg (222 lb)  12/02/14 99.791 kg (220 lb)    General:  Appears calm and comfortable Eyes: PERRL, normal lids, irises & conjunctiva ENT: grossly normal hearing, lips & tongue Neck: JVD positive Cardiovascular: RRR, no m/r/g. No lower extremity edema Telemetry: SR, no arrhythmias  Respiratory: CTA bilaterally, no w/r/r. Normal respiratory effort. Abdomen: soft, ntnd Skin: no rash or induration seen on limited exam Musculoskeletal: grossly normal tone BUE/BLE Psychiatric: grossly normal mood and affect, speech fluent and appropriate Neurologic: grossly non-focal.          Labs on Admission:  Basic Metabolic Panel:  Recent Labs Lab 09/18/15 1604  NA 131*  K 4.7  CL 94*  CO2 28  GLUCOSE 163*  BUN 7  CREATININE 0.76   CALCIUM 9.4   Liver Function Tests: No results for input(s): AST, ALT, ALKPHOS, BILITOT, PROT, ALBUMIN in the last 168 hours. No results for input(s): LIPASE, AMYLASE in the last 168 hours. No results for input(s): AMMONIA in the last 168 hours. CBC:  Recent Labs Lab 09/18/15 1604  WBC 4.3  HGB 12.4*  HCT 36.4*  MCV 86.1  PLT 252   Cardiac Enzymes: No results for input(s): CKTOTAL, CKMB, CKMBINDEX, TROPONINI in the last 168 hours.  BNP (last 3 results) No results for input(s): BNP in the last 8760 hours.  ProBNP (last 3 results) No results for input(s): PROBNP in the last 8760 hours.  CBG: No results for input(s): GLUCAP in the last 168 hours.  Radiological Exams on Admission: No results found.  EKG: Independently reviewed. Normal sinus rhythm normal intervals normal axis.  Assessment/Plan Chest pain, rule out acute myocardial infarction: Quite concerning due to his history his heart score 6. Cannot tolerate beta blocker as heart rate is in the 60's. Has positive JVD on physical exam has no lower extremity edema. He is supposed to be on Florinef, which he has not taken in over a month. Start him on aspirin, heparin for DVT prophylaxis. Cycle cardiac enzymes 3 get a 2-D echo EKG in the morning. It is to note the last time he had a cardiac cath he had an allergic reaction to the dye. He is not on a statin at home as he relates his causes muscle pain. We'll start him empirically on low-dose Crestor. Check a fasting lipid panel. Placed him after midnight, his cardiologist to see him in the morning.  Diabetic neuropathy associated with diabetes mellitus due to underlying condition (HCC)/  Diabetes mellitus type 2 with  peripheral artery disease ([place]): DC her hypoglycemic agents. Start him on low-dose Levemir plus sliding scale insulin we'll place nothing by mouth after midnight.  Vitamin D deficiency: Cont current home regimen.  Chronic pain syndrome; Continue current  home regimen.  Essential hypertension: Blood pressure well controlled resume home regimen.  Chronic hyponatremia: He is not on Florinef at home we'll continue to monitor. Anthony. Dwyane Dee as an outpatient.  Hyperlipidemia statins    Code Status: full DVT Prophylaxis:heparin Family Communication: none Disposition Plan:observation  Time spent: 65 min  Charlynne Cousins [facility] Pager [phone]

## 2015-09-19 ENCOUNTER — Observation Stay (HOSPITAL_BASED_OUTPATIENT_CLINIC_OR_DEPARTMENT_OTHER): Payer: Medicare Other

## 2015-09-19 ENCOUNTER — Encounter (HOSPITAL_COMMUNITY)
Admission: EM | Disposition: A | Discharge status: Home / Self Care | Payer: Self-pay | Source: Home / Self Care | Attending: Cardiothoracic Surgery

## 2015-09-19 DIAGNOSIS — I25110 Atherosclerotic heart disease of native coronary artery with unstable angina pectoris: Secondary | ICD-10-CM

## 2015-09-19 DIAGNOSIS — E1151 Type 2 diabetes mellitus with diabetic peripheral angiopathy without gangrene: Secondary | ICD-10-CM

## 2015-09-19 DIAGNOSIS — G894 Chronic pain syndrome: Secondary | ICD-10-CM | POA: Diagnosis not present

## 2015-09-19 DIAGNOSIS — R079 Chest pain, unspecified: Secondary | ICD-10-CM

## 2015-09-19 HISTORY — PX: CARDIAC CATHETERIZATION: SHX172

## 2015-09-19 LAB — GLUCOSE, CAPILLARY
GLUCOSE-CAPILLARY: 95 mg/dL (ref 65–99)
Glucose-Capillary: 113 mg/dL — ABNORMAL HIGH (ref 65–99)
Glucose-Capillary: 152 mg/dL — ABNORMAL HIGH (ref 65–99)
Glucose-Capillary: 309 mg/dL — ABNORMAL HIGH (ref 65–99)

## 2015-09-19 LAB — POCT ACTIVATED CLOTTING TIME: Activated Clotting Time: 219 seconds

## 2015-09-19 LAB — PROTIME-INR
INR: 1.12 (ref 0.00–1.49)
PROTHROMBIN TIME: 14.6 s (ref 11.6–15.2)

## 2015-09-19 LAB — TROPONIN I
TROPONIN I: 0.03 ng/mL (ref <0.031)
Troponin I: <0.03 ng/mL (ref <0.031)

## 2015-09-19 LAB — HEMOGLOBIN A1C
Hgb A1c MFr Bld: 7.6 % — ABNORMAL HIGH (ref 4.8–5.6)
MEAN PLASMA GLUCOSE: 171 mg/dL

## 2015-09-19 SURGERY — LEFT HEART CATH AND CORONARY ANGIOGRAPHY
Anesthesia: LOCAL

## 2015-09-19 MED ORDER — NITROGLYCERIN 0.4 MG SL SUBL
SUBLINGUAL_TABLET | SUBLINGUAL | Status: AC
  Administered 2015-09-19: 20:00:00
  Administered 2015-09-19: 0.4 mg via SUBLINGUAL
  Filled 2015-09-19: qty 1

## 2015-09-19 MED ORDER — HEPARIN (PORCINE) IN NACL 100-0.45 UNIT/ML-% IJ SOLN
1350.0000 [IU]/h | INTRAMUSCULAR | Status: DC
  Administered 2015-09-19: 1150 [IU]/h via INTRAVENOUS
  Administered 2015-09-20: 1350 [IU]/h via INTRAVENOUS
  Filled 2015-09-19 (×4): qty 250

## 2015-09-19 MED ORDER — DIPHENHYDRAMINE HCL 50 MG/ML IJ SOLN: 25.0000 mg | INTRAMUSCULAR | Status: DC

## 2015-09-19 MED ORDER — SODIUM CHLORIDE 0.9% FLUSH
3.0000 mL | INTRAVENOUS | Status: DC | PRN
  Administered 2015-09-21: 3 mL via INTRAVENOUS
  Filled 2015-09-19: qty 3

## 2015-09-19 MED ORDER — VERAPAMIL HCL 2.5 MG/ML IV SOLN
INTRA_ARTERIAL | Status: DC | PRN
  Administered 2015-09-19 (×2): via INTRA_ARTERIAL

## 2015-09-19 MED ORDER — SODIUM CHLORIDE 0.9% FLUSH: 3.0000 mL | INTRAVENOUS | Status: DC | PRN

## 2015-09-19 MED ORDER — ATORVASTATIN CALCIUM 80 MG PO TABS
80.0000 mg | ORAL_TABLET | Freq: Every day | ORAL | Status: DC
  Administered 2015-09-20 – 2015-09-29 (×10): 80 mg via ORAL
  Filled 2015-09-19 (×10): qty 1

## 2015-09-19 MED ORDER — MIDAZOLAM HCL 2 MG/2ML IJ SOLN
INTRAMUSCULAR | Status: AC
  Filled 2015-09-19: qty 2

## 2015-09-19 MED ORDER — SODIUM CHLORIDE 0.9 % IV SOLN: 250.0000 mL | INTRAVENOUS | Status: DC | PRN

## 2015-09-19 MED ORDER — SODIUM CHLORIDE 0.9 % WEIGHT BASED INFUSION: 3.0000 mL/kg/h | INTRAVENOUS | Status: AC

## 2015-09-19 MED ORDER — SODIUM CHLORIDE 0.9 % WEIGHT BASED INFUSION: 1.0000 mL/kg/h | INTRAVENOUS | Status: DC

## 2015-09-19 MED ORDER — FAMOTIDINE 20 MG PO TABS
20.0000 mg | ORAL_TABLET | ORAL | Status: AC
  Administered 2015-09-19: 20 mg via ORAL
  Filled 2015-09-19: qty 1

## 2015-09-19 MED ORDER — SODIUM CHLORIDE 0.9% FLUSH
3.0000 mL | Freq: Two times a day (BID) | INTRAVENOUS | Status: DC
  Administered 2015-09-19: 3 mL via INTRAVENOUS

## 2015-09-19 MED ORDER — HEPARIN SODIUM (PORCINE) 1000 UNIT/ML IJ SOLN
INTRAMUSCULAR | Status: DC | PRN
  Administered 2015-09-19: 7000 [IU] via INTRAVENOUS

## 2015-09-19 MED ORDER — NITROGLYCERIN IN D5W 200-5 MCG/ML-% IV SOLN
0.0000 ug/min | INTRAVENOUS | Status: DC
  Administered 2015-09-19: 20 ug/min via INTRAVENOUS
  Filled 2015-09-19 (×2): qty 250

## 2015-09-19 MED ORDER — HEPARIN SODIUM (PORCINE) 1000 UNIT/ML IJ SOLN
INTRAMUSCULAR | Status: AC
  Filled 2015-09-19: qty 1

## 2015-09-19 MED ORDER — SODIUM CHLORIDE 0.9% FLUSH
3.0000 mL | Freq: Two times a day (BID) | INTRAVENOUS | Status: DC
  Administered 2015-09-19 – 2015-09-21 (×3): 3 mL via INTRAVENOUS

## 2015-09-19 MED ORDER — HYDROMORPHONE HCL 1 MG/ML IJ SOLN
INTRAMUSCULAR | Status: DC | PRN
  Administered 2015-09-19: 0.5 mg via INTRAVENOUS

## 2015-09-19 MED ORDER — ATORVASTATIN CALCIUM 80 MG PO TABS
80.0000 mg | ORAL_TABLET | ORAL | Status: AC
  Administered 2015-09-19: 80 mg via ORAL
  Filled 2015-09-19: qty 1

## 2015-09-19 MED ORDER — LIDOCAINE HCL (PF) 1 % IJ SOLN
INTRAMUSCULAR | Status: DC | PRN
  Administered 2015-09-19 (×2): 2 mL

## 2015-09-19 MED ORDER — SODIUM CHLORIDE 0.9 % WEIGHT BASED INFUSION
3.0000 mL/kg/h | INTRAVENOUS | Status: DC
  Administered 2015-09-19: 3 mL/kg/h via INTRAVENOUS

## 2015-09-19 MED ORDER — LIDOCAINE HCL (PF) 1 % IJ SOLN
INTRAMUSCULAR | Status: AC
  Filled 2015-09-19: qty 30

## 2015-09-19 MED ORDER — METHYLPREDNISOLONE SODIUM SUCC 125 MG IJ SOLR
125.0000 mg | INTRAMUSCULAR | Status: AC
  Administered 2015-09-19: 125 mg via INTRAVENOUS
  Filled 2015-09-19: qty 2

## 2015-09-19 MED ORDER — NITROGLYCERIN 0.4 MG SL SUBL
SUBLINGUAL_TABLET | SUBLINGUAL | Status: AC
  Filled 2015-09-19: qty 1

## 2015-09-19 MED ORDER — NITROGLYCERIN 1 MG/10 ML FOR IR/CATH LAB
INTRA_ARTERIAL | Status: AC
  Filled 2015-09-19: qty 10

## 2015-09-19 MED ORDER — HEPARIN (PORCINE) IN NACL 2-0.9 UNIT/ML-% IJ SOLN
INTRAMUSCULAR | Status: DC | PRN
  Administered 2015-09-19: 17:00:00

## 2015-09-19 MED ORDER — MIDAZOLAM HCL 2 MG/2ML IJ SOLN
INTRAMUSCULAR | Status: DC | PRN
  Administered 2015-09-19: 2 mg via INTRAVENOUS

## 2015-09-19 MED ORDER — IOHEXOL 350 MG/ML SOLN
INTRAVENOUS | Status: DC | PRN
  Administered 2015-09-19: 120 mL via INTRA_ARTERIAL

## 2015-09-19 MED ORDER — ENSURE ENLIVE PO LIQD
237.0000 mL | Freq: Two times a day (BID) | ORAL | Status: DC
  Administered 2015-09-19 – 2015-09-21 (×3): 237 mL via ORAL

## 2015-09-19 MED ORDER — METHYLPREDNISOLONE SODIUM SUCC 125 MG IJ SOLR: 125.0000 mg | INTRAMUSCULAR | Status: DC

## 2015-09-19 MED ORDER — DIPHENHYDRAMINE HCL 50 MG/ML IJ SOLN
25.0000 mg | INTRAMUSCULAR | Status: AC
  Administered 2015-09-19: 25 mg via INTRAVENOUS
  Filled 2015-09-19: qty 1

## 2015-09-19 MED ORDER — VERAPAMIL HCL 2.5 MG/ML IV SOLN
INTRAVENOUS | Status: AC
  Filled 2015-09-19: qty 2

## 2015-09-19 MED ORDER — HYDROMORPHONE HCL 1 MG/ML IJ SOLN
INTRAMUSCULAR | Status: AC
  Filled 2015-09-19: qty 1

## 2015-09-19 MED ORDER — FAMOTIDINE 20 MG PO TABS: 20.0000 mg | ORAL_TABLET | ORAL | Status: DC

## 2015-09-19 MED ORDER — HEPARIN (PORCINE) IN NACL 2-0.9 UNIT/ML-% IJ SOLN
INTRAMUSCULAR | Status: AC
  Filled 2015-09-19: qty 1000

## 2015-09-19 SURGICAL SUPPLY — 11 items
CATH INFINITI 5 FR AL2 (CATHETERS) ×2 IMPLANT
CATH INFINITI 5FR JL4 (CATHETERS) ×2 IMPLANT
CATH OPTITORQUE JACKY 4.0 5F (CATHETERS) ×2 IMPLANT
DEVICE RAD COMP TR BAND LRG (VASCULAR PRODUCTS) ×4 IMPLANT
GLIDESHEATH SLEND A-KIT 6F 20G (SHEATH) ×4 IMPLANT
GUIDEWIRE ANGLED .035X150CM (WIRE) ×2 IMPLANT
KIT HEART LEFT (KITS) ×2 IMPLANT
PACK CARDIAC CATHETERIZATION (CUSTOM PROCEDURE TRAY) ×2 IMPLANT
TRANSDUCER W/STOPCOCK (MISCELLANEOUS) ×2 IMPLANT
TUBING CIL FLEX 10 FLL-RA (TUBING) ×2 IMPLANT
WIRE SAFE-T 1.5MM-J .035X260CM (WIRE) ×2 IMPLANT

## 2015-09-19 NOTE — Progress Notes (Signed)
TR bands were removed from patient's left and right radial; gauze and clear tegaderm dressing placed over sites. No bleeding observed. Vital signs stable. Will continue to monitor patient.

## 2015-09-19 NOTE — Progress Notes (Signed)
Initial Nutrition Assessment  DOCUMENTATION CODES:   Non-severe (moderate) malnutrition in context of social or environmental circumstances  INTERVENTION:    Add Ensure Enlive po BID when diet advanced after procedure, each supplement provides 350 kcal and 20 grams of protein  NUTRITION DIAGNOSIS:   Malnutrition related to social / environmental circumstances as evidenced by mild depletion of body fat, mild depletion of muscle mass.  GOAL:   Patient will meet greater than or equal to 90% of their needs  MONITOR:   Diet advancement, PO intake, Supplement acceptance, Labs  REASON FOR ASSESSMENT:   Malnutrition Screening Tool    ASSESSMENT:   70 y.o. male past medical history of diabetes mellitus type 2 with last A1c of 6.9, chronic hyponatremia, history of tobacco abuse, essential hypertension, peripheral vascular disease that comes in for typical chest pain that started about a month ago.   Nutrition-Focused physical exam completed. Findings are mild fat depletion, mild muscle depletion, and no edema. Patient reports that he has lost some weight over the past few months. He thinks it's because he's "getting old." He doesn't cook because he lives by himself and doesn't want to cook for just himself. He usually eats cereal for breakfast, may or may not eat a little something for lunch, then has a quick supper at home or goes out to eat. Recommended eating 3 meals per day and discussed heart healthy options. He has had Ensure supplements at home in the past and is willing to drink them between meals. Plans for coronary angiogram today. Currently NPO.  Diet Order:  Diet NPO time specified Except for: Sips with Meds  Skin:  Reviewed, no issues  Last BM:  unknown  Height:   Ht Readings from Last 1 Encounters:  09/18/15 6' (1.829 m)    Weight:   Wt Readings from Last 1 Encounters:  09/19/15 209 lb 4.8 oz (94.938 kg)    Ideal Body Weight:  80.9 kg  BMI:  Body mass index  is 28.38 kg/(m^2).  Estimated Nutritional Needs:   Kcal:  2050-2250  Protein:  110-125 gm  Fluid:  2-2.2 L  EDUCATION NEEDS:   Education needs addressed  Molli Barrows, [place], [place], [place] Pager [phone] After Hours Pager [phone]

## 2015-09-19 NOTE — Interval H&P Note (Signed)
History and Physical Interval Note:  09/19/2015 4:19 PM  Worthington Murillo  has presented today for surgery, with the diagnosis of [place]  The various methods of treatment have been discussed with the patient and family. After consideration of risks, benefits and other options for treatment, the patient has consented to  Procedure(s): Left Heart Cath and Coronary Angiography (N/A) and possible PCI  as a surgical intervention .  The patient's history has been reviewed, patient examined, no change in status, stable for surgery.  I have reviewed the patient's chart and labs.  Questions were answered to the patient's satisfaction.   Ischemic Symptoms? CCS IV (Inability to carry on any physical activity without discomfort) Anti-ischemic Medical Therapy? Minimal Therapy (1 class of medications) Non-invasive Test Results? No non-invasive testing performed Prior CABG? No Previous CABG   Patient Information:   1-2V CAD, no prox LAD  A (7)  Indication: 20; Score: 7   Patient Information:   1-2V-CAD with DS 50-60% With No FFR, No IVUS  I (3)  Indication: 21; Score: 3   Patient Information:   1-2V-CAD with DS 50-60% With FFR  A (7)  Indication: 22; Score: 7   Patient Information:   1-2V-CAD with DS 50-60% With FFR>0.8, IVUS not significant  I (2)  Indication: 23; Score: 2   Patient Information:   3V-CAD without LMCA With Abnormal LV systolic function  A (9)  Indication: 48; Score: 9   Patient Information:   LMCA-CAD  A (9)  Indication: 49; Score: 9   Patient Information:   3V-CAD without LMCA With Low CAD burden(i.e., 3 focal stenoses, low SYNTAX score) PCI  A (7)  Indication: 63; Score: 7   Patient Information:   3V-CAD without LMCA With Low CAD burden(i.e., 3 focal stenoses, low SYNTAX score) CABG  A (9)  Indication: 63; Score: 9   Patient Information:   3V-CAD without LMCA E06c - Intermediate-high CAD burden (i.e., multiple diffuse lesions, presence of CTO,  or high SYNTAX score) PCI  U (4)  Indication: 64; Score: 4   Patient Information:   3V-CAD without LMCA E06c - Intermediate-high CAD burden (i.e., multiple diffuse lesions, presence of CTO, or high SYNTAX score) CABG  A (9)  Indication: 64; Score: 9   Patient Information:   LMCA-CAD With Isolated LMCA stenosis  PCI  U (6)  Indication: 65; Score: 6   Patient Information:   LMCA-CAD With Isolated LMCA stenosis  CABG  A (9)  Indication: 65; Score: 9   Patient Information:   LMCA-CAD Additional CAD, low CAD burden (i.e., 1- to 2-vessel additional involvement, low SYNTAX score) PCI  U (5)  Indication: 66; Score: 5   Patient Information:   LMCA-CAD Additional CAD, low CAD burden (i.e., 1- to 2-vessel additional involvement, low SYNTAX score) CABG  A (9)  Indication: 66; Score: 9   Patient Information:   LMCA-CAD Additional CAD, intermediate-high CAD burden (i.e., 3-vessel involvement, presence of CTO, or high SYNTAX score) PCI  I (3)  Indication: 67; Score: 3   Patient Information:   LMCA-CAD Additional CAD, intermediate-high CAD burden (i.e., 3-vessel involvement, presence of CTO, or high SYNTAX score) CABG  A (9)  Indication: 67; Score: 9   Brookley Spitler

## 2015-09-19 NOTE — Progress Notes (Signed)
Patient begin complaining of 6/10 chest pain at 1940 after standing up to use the urinal. Patient's PRN morphine was given with no relief. EKG was obtained at this time and sublingual nitroglycerin was overidden from the Pyxis; patient received 2 doses. Patient reported reduction in chest pain to 1/10. Dr. Einar Gip was notified; orders received to start IV nitroglycerin and IV heparin. Will continue to monitor patient closely.

## 2015-09-19 NOTE — Progress Notes (Signed)
Echocardiogram 2D Echocardiogram has been performed.  09/19/2015 3:34 PM Maudry Mayhew, RVT, RDCS, RDMS

## 2015-09-19 NOTE — Plan of Care (Signed)
Problem: Safety: Goal: Ability to remain free from injury will improve Outcome: Completed/Met Date Met:  09/19/15 Patient is able to ambulate in his room independently. Patient will alert RN if he needs assistance. Patient's room is free of clutter and he has no equipment attached to him that could pose a safety risk.   Problem: Pain Managment: Goal: General experience of comfort will improve Outcome: Progressing Patient has had no complaints of chest pain during the shift. Patient does experience chronic pain in his back and in his head. Patient has been educated about pain management while in the hospital and has received his PRN pain medication upon request.   Problem: Physical Regulation: Goal: Ability to maintain clinical measurements within normal limits will improve Outcome: Completed/Met Date Met:  09/19/15 All patients vital signs are within normal limits. Patient's lab work is being monitored, including his troponin levels.

## 2015-09-19 NOTE — Care Management Note (Signed)
Case Management Note  Patient Details  Name: [name] MRN: [id number] Date of Birth: [birth year]  Subjective/Objective:  Pt admitted for Chest Pain. Pt is planned for cardiac cath 09-19-15. Pt is from home alone. Per pt he still drives and is not homebound.   Action/Plan: No needs identified by CM at this time. CM will continue to monitor.    Expected Discharge Date:                  Expected Discharge Plan:  Home/Self Care  In-House Referral:  NA  Discharge planning Services  CM Consult  Post Acute Care Choice:  NA Choice offered to:  NA  DME Arranged:  N/A DME Agency:  NA  HH Arranged:  NA HH Agency:  NA  Status of Service:  Completed, signed off  Medicare Important Message Given:    Date Medicare IM Given:    Medicare IM give by:    Date Additional Medicare IM Given:    Additional Medicare Important Message give by:     If discussed at [place] of Stay Meetings, dates discussed:    Additional Comments:  Bethena Roys, RN 09/19/2015, 11:10 AM

## 2015-09-19 NOTE — Progress Notes (Signed)
ANTICOAGULATION CONSULT NOTE - Initial Consult  Pharmacy Consult for heparin Indication: chest pain/ACS  Allergies  Allergen Reactions  . Ivp Dye [Iodinated Diagnostic Agents]     Blood Pressure dropped----pt was pre-medicated with 13 hour prep and did fine with pre-meds--amy 03/09/11   . Penicillins Nausea Only    REACTION: gi upset  . Sulfonamide Derivatives Hives and Itching  . Sulfa Antibiotics Hives  . Propoxyphene N-Acetaminophen Other (See Comments)    Sharp pains- headache    Patient Measurements: Height: 6' (182.9 cm) Weight: 209 lb 4.8 oz (94.938 kg) IBW/kg (Calculated) : 77.6 Heparin Dosing Weight: 94.9 kg  Vital Signs: Temp: 98.4 F (36.9 C) (02/03 1938) Temp Source: Oral (02/03 1938) BP: 148/66 mmHg (02/03 2030) Pulse Rate: 91 (02/03 2030)  Labs:  Recent Labs  09/18/15 1604 09/18/15 1920 09/19/15 0359 09/19/15 0934 09/19/15 1041  HGB 12.4* 12.6*  --   --   --   HCT 36.4* 36.8*  --   --   --   PLT 252 256  --   --   --   LABPROT  --   --   --   --  14.6  INR  --   --   --   --  1.12  CREATININE 0.76 0.74  --   --   --   TROPONINI  --  0.03 0.03 <0.03  --     Estimated Creatinine Clearance: 104.2 mL/min (by C-G formula based on Cr of 0.74).   Medical History: Past Medical History  Diagnosis Date  . DJD (degenerative joint disease)     L wrist  . Lumbar disc disease   . HTN (hypertension)   . Anemia     NOS  . Osteoporosis     tx per [facility], Dr Prudencio Burly, thought due to heavy steriod use after 1978  . Spine fracture     hx, multiple  . Blindness of left eye     near blindness. 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 GERD (gastroesophageal reflux disease)   . DM2 (diabetes mellitus, type 2) ([place])   . Anxiety   . Depression   . BPH (benign prostatic hypertrophy)   . HLD (hyperlipidemia)   . Chronic pain syndrome   . Chronic hyponatremia   . Diabetes mellitus   . Broken foot Oct. 12, 2013    Right foot Fx  . Varicose veins     Medications:  Scheduled:  .  aspirin EC  325 mg Oral Daily  . [START ON 09/20/2015] atorvastatin  80 mg Oral Q0600  . buPROPion  300 mg Oral BH-q7a  . cholecalciferol  5,000 Units Oral Daily  . clonazePAM  1 mg Oral TID  . feeding supplement (ENSURE ENLIVE)  237 mL Oral BID BM  . [START ON 09/21/2015] fentaNYL  100 mcg Transdermal Q72H  . heparin  5,000 Units Subcutaneous 3 times per day  . insulin aspart  0-15 Units Subcutaneous TID WC  . insulin aspart  0-5 Units Subcutaneous QHS  . insulin aspart  4 Units Subcutaneous TID WC  . insulin detemir  5 Units Subcutaneous QHS  . losartan  25 mg Oral Daily  . pantoprazole  40 mg Oral Daily  . sodium chloride flush  3 mL Intravenous Q12H  . tamsulosin  0.4 mg Oral BID  . zonisamide  150 mg Oral QHS   Infusions:  . sodium chloride 3 mL/kg/hr (09/19/15 1815)  . nitroGLYCERIN      Assessment: 70 yo  male with chest pain s/p cath will be started on heparin. MD is ok to start heparin now. Last heparin SQ dose was 0612 today.   Goal of Therapy:  Heparin level 0.3-0.7 units/ml Monitor platelets by anticoagulation protocol: Yes   Plan:  - Start heparin drip at 1150 units/hr. No bolus - 6hr heparin level - daily heparin level and CBC  Temica Righetti, Tsz-[place] 09/19/2015,9:04 PM

## 2015-09-19 NOTE — Care Management Obs Status (Signed)
[facility] NOTIFICATION   Patient Details  Name: [name] MRN: [id number] Date of Birth: [birth year]   Medicare Observation Status Notification Given:  Yes    Bethena Roys, RN 09/19/2015, 11:09 AM

## 2015-09-19 NOTE — Progress Notes (Signed)
[facility] PROGRESS NOTE  [name] [id number] DOB: 04-19-1946 DOA: 09/18/2015 PCP:  [name], MD  Assessment/Plan:  Chest pain on exertion: MI ruled out. Going for cath with pretreatment Active Problems:   Diabetic neuropathy associated with diabetes mellitus due to underlying condition ([place])   Vitamin D deficiency   Hyperlipidemia   Chronic pain syndrome   Essential hypertension   Diabetes mellitus type 2 with peripheral artery disease (HCC)   Chronic hyponatremia   HPI/Subjective: No CP. No dyspnea.  Objective: Filed Vitals:   09/19/15 1114 09/19/15 1216  BP:  103/57  Pulse:  61  Temp: 97.6 F (36.4 C) 98.1 F (36.7 C)  Resp:      Intake/Output Summary (Last 24 hours) at 09/19/15 1334 Last data filed at 09/19/15 0235  Gross per 24 hour  Intake    360 ml  Output   1475 ml  Net  -1115 ml   Filed Weights   09/18/15 1602 09/18/15 1854 09/19/15 0436  Weight: 98.793 kg (217 lb 12.8 oz) 95.301 kg (210 lb 1.6 oz) 94.938 kg (209 lb 4.8 oz)    Exam:   General:  Asleep. Arousable. Cooperative and appropriate  Cardiovascular: RRR without MGR  Respiratory: CTA without WRR  Abdomen: S, NT, ND  Ext: no CCE  Basic Metabolic Panel:  Recent Labs Lab 09/18/15 1604 09/18/15 1920  NA 131*  --   K 4.7  --   CL 94*  --   CO2 28  --   GLUCOSE 163*  --   BUN 7  --   CREATININE 0.76 0.74  CALCIUM 9.4  --    Liver Function Tests: No results for input(s): AST, ALT, ALKPHOS, BILITOT, PROT, ALBUMIN in the last 168 hours. No results for input(s): LIPASE, AMYLASE in the last 168 hours. No results for input(s): AMMONIA in the last 168 hours. CBC:  Recent Labs Lab 09/18/15 1604 09/18/15 1920  WBC 4.3 4.5  HGB 12.4* 12.6*  HCT 36.4* 36.8*  MCV 86.1 85.0  PLT 252 256   Cardiac Enzymes:  Recent Labs Lab 09/18/15 1920 09/19/15 0359 09/19/15 0934  TROPONINI 0.03 0.03 <0.03   BNP (last 3 results) No results for input(s): BNP in the last 8760  hours.  ProBNP (last 3 results) No results for input(s): PROBNP in the last 8760 hours.  CBG:  Recent Labs Lab 09/18/15 1853 09/18/15 2216 09/19/15 0723 09/19/15 1137  GLUCAP 88 129* 95 113*    No results found for this or any previous visit (from the past 240 hour(s)).   Studies: No results found.  Scheduled Meds: . aspirin EC  325 mg Oral Daily  . [START ON 09/20/2015] atorvastatin  80 mg Oral Q0600  . buPROPion  300 mg Oral BH-q7a  . cholecalciferol  5,000 Units Oral Daily  . clonazePAM  1 mg Oral TID  . diphenhydrAMINE  25 mg Intravenous Pre-Cath  . famotidine  20 mg Oral Pre-Cath  . feeding supplement (ENSURE ENLIVE)  237 mL Oral BID BM  . [START ON 09/21/2015] fentaNYL  100 mcg Transdermal Q72H  . heparin  5,000 Units Subcutaneous 3 times per day  . insulin aspart  0-15 Units Subcutaneous TID WC  . insulin aspart  0-5 Units Subcutaneous QHS  . insulin aspart  4 Units Subcutaneous TID WC  . insulin detemir  5 Units Subcutaneous QHS  . losartan  25 mg Oral Daily  . methylPREDNISolone (SOLU-MEDROL) injection  125 mg Intravenous Pre-Cath  . pantoprazole  40 mg Oral Daily  . sodium chloride flush  3 mL Intravenous Q12H  . tamsulosin  0.4 mg Oral BID  . zonisamide  150 mg Oral QHS   Continuous Infusions: . sodium chloride      Time spent: 15 minutes  [facility] [email or web address], password [facility] 09/19/2015, 1:34 PM

## 2015-09-19 NOTE — Plan of Care (Signed)
Problem: Consults Goal: Chest Pain Patient Education (See Patient Education module for education specifics.)  Outcome: Progressing Assess need for Education of Cardiac tests and procedures  Problem: Phase I Progression Outcomes Goal: Hemodynamically stable Outcome: Progressing Assess VS per Unit Routine and PRN

## 2015-09-19 NOTE — H&P (View-Only) (Signed)
CARDIOLOGY CONSULT NOTE  Patient ID: [name] MRN: [id number] DOB/AGE: January 23, 1946 70 y.o.  Admit date: 09/18/2015 Referring Physician: Internal Medicine Primary Physician:   [name], MD Reason for Consultation: Angina  HPI: [name]  is a 70 y.o. 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 He presented to the hospital on 09/18/2015 with reports of 1-2 month history of exertional chest pain, relieved by rest. Also reports associated dyspnea and diaphoresis. He was seen by his PCP who recommended hospitalization for further evaluation.   He is presently on Coreg, not on statin as he states it causes severe myalgias. Previously on fludrocortisone for chronic hyponatremia but has not taken this in several months.  Past Medical History  Diagnosis Date  . DJD (degenerative joint disease)     L wrist  . Lumbar disc disease   . HTN (hypertension)   . Anemia     NOS  . Osteoporosis     tx per [facility], Dr Prudencio Burly, thought due to heavy steriod use after 1978  . Spine fracture     hx, multiple  . Blindness of left eye     near blindness. 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 GERD (gastroesophageal reflux disease)   . DM2 (diabetes mellitus, type 2) ([place])   . Anxiety   . Depression   . BPH (benign prostatic hypertrophy)   . HLD (hyperlipidemia)   . Chronic pain syndrome   . Chronic hyponatremia   . Diabetes mellitus   . Broken foot Oct. 12, 2013    Right foot Fx  . Varicose veins      Past Surgical History  Procedure Laterality Date  . Right hand fracture  1969  . Left ankle ganglion cyst  1976  . Lumbar disc surgery  1981, 2004  . Nasal sinus surgery      multiple- x8. last 1997 with obliteration  . Left cataract  1996    right - 1997   . Left foot surgery  1998    R surgery/fracture - 1999  . Left cts  2006    R CTS - 2007  . Left wrist/hand fusion  2008  . L foot open repair jones fracture  2010     5th metetarsal   . Rotator cuff repair  2006    R, than repeat 2011, Dr Theda Sers  . Vocal surgery  1996  . Repair thoracic aorta    . Trigger finger release Left 11/11/2014    Procedure: LEFT LONG FINGER RELEASE TRIGGER FINGER/A-1 PULLEY;  Surgeon: Milly Jakob, MD;  Location: [facility];  Service: Orthopedics;  Laterality: Left;  Marland Kitchen Eye surgery       Family History  Problem Relation Age of Onset  . Lung cancer Father     and grandmother  . Throat cancer      family hx - "bad living" - also lung CA, heart disease and renal failure    . Diabetes Mother     DM, also sister and MGF      Social History: Social History   Social History  . Marital Status: Divorced    Spouse Name: N/A  . Number of Children: N/A  . Years of Education: N/A   Occupational History  . Not on file.  Social History Main Topics  . Smoking status: Former Smoker -- 2.00 packs/day    Types: Cigarettes    Quit date: 11/06/1988  . Smokeless tobacco: Never Used  . Alcohol Use: No  . Drug Use: No  . Sexual Activity: No   Other Topics Concern  . Not on file   Social History Narrative   Divorced. 2 adult sons - 81 with DM.    Disabled after [place] accident with back injury 1978.      Prescriptions prior to admission  Medication Sig Dispense Refill Last Dose  . BAYER BREEZE 2 TEST DISK USE TO TEST BLOOD GLUCOSE THREE TIMES DAILY 100 each 3 Taking  . Blood Glucose Monitoring Suppl (BAYER BREEZE 2 SYSTEM) W/DEVICE KIT Use to check blood sugars once a day 1 kit 0 Taking  . buPROPion (WELLBUTRIN XL) 300 MG 24 hr tablet Take 300 mg by mouth every morning.    09/18/2015 at Unknown time  . carvedilol (COREG) 6.25 MG tablet Take 1 tablet by mouth daily.   09/18/2015 at 0800  . celecoxib (CELEBREX) 200 MG capsule Take 200 mg  by mouth 2 (two) times daily.   1 09/18/2015 at Unknown time  . Cholecalciferol (VITAMIN D) 1000 UNITS capsule Take 5,000 Units by mouth daily.    Past Week at Unknown time  . clonazePAM (KLONOPIN) 1 MG tablet Take 1 mg by mouth 3 (three) times daily.    09/18/2015 at Unknown time  . clonazePAM (KLONOPIN) 1 MG tablet Take 1 tablet by mouth 3 (three) times daily.     . cyclobenzaprine (FLEXERIL) 10 MG tablet   0 09/18/2015 at Unknown time  . fentaNYL (DURAGESIC - DOSED MCG/HR) 100 MCG/HR Place 1 patch onto the skin every 3 (three) days.   09/18/2015 at Unknown time  . fentaNYL (DURAGESIC - DOSED MCG/HR) 25 MCG/HR patch Place 1 patch onto the skin every 3 (three) days.     Marland Kitchen [place]    Taking  . omeprazole (PRILOSEC) 40 MG capsule Take 1 capsule by mouth 2 (two) times daily.   09/18/2015 at Unknown time  . Oral Electrolytes (BUFFERED SALT) TABS Take by mouth daily. Therma Tabs   Past Week at Unknown time  . ORAL ELECTROLYTES PO Take 6-8 tablets by mouth.   Past Week at Unknown time  . oxyCODONE (ROXICODONE) 15 MG immediate release tablet continuous as needed for pain.    09/18/2015 at Unknown time  . PROAIR HFA 108 (90 BASE) MCG/ACT inhaler    09/17/2015 at Unknown time  . sitaGLIPtin (JANUVIA) 100 MG tablet Take 1 tablet (100 mg total) by mouth daily. 30 tablet 3 09/18/2015 at Unknown time  . tamsulosin (FLOMAX) 0.4 MG CAPS capsule Take 1 capsule by mouth 2 (two) times daily.   09/18/2015 at Unknown time  . VITAMIN E PO Take 8,000 Units by mouth daily.   09/17/2015 at Unknown time  . zolpidem (AMBIEN) 10 MG tablet Take 1 tablet by mouth as needed. For sleep `   Past Week at Unknown time  . zonisamide (ZONEGRAN) 50 MG capsule Take 3 capsules by mouth at bedtime.   09/17/2015 at Unknown time  . fludrocortisone (FLORINEF) 0.1 MG tablet Take 1 tablet every day (Patient not taking: Reported on 03/24/2015) 30 tablet 3 Not Taking at Unknown time     ROS: General: no fevers/chills/night sweats Eyes: Left eye  vision loss after CVA in 2009 ENT: no sore throat or hearing loss Resp: Reports SOB  associated with chest pain; no cough, wheezing, or hemoptysis CV: Reports chest pain; no edema or palpitations GI: no abdominal pain, nausea, vomiting, diarrhea, or constipation GU: no dysuria, frequency, or hematuria Skin: no rash Neuro: no headache, numbness, tingling, or weakness of extremities Musculoskeletal: no joint pain or swelling Heme: no bleeding, DVT, or easy bruising Endo: no polydipsia or polyuria    Physical Exam: Blood pressure 93/54, pulse 60, temperature 98.2 F (36.8 C), temperature source Oral, resp. rate 18, height 6' (1.829 m), weight 94.938 kg (209 lb 4.8 oz), SpO2 92 %.   General appearance: alert, cooperative, appears stated age and no distress Neck: bilateral carotid bruit Lungs: clear to auscultation bilaterally Chest wall: no tenderness Heart: regular rate and rhythm, S1, S2 normal, no murmur, click, rub or gallop Extremities: extremities normal, atraumatic, no cyanosis or edema Pulses: 2+ and symmetric bilateral soft femoral bruits Skin: Skin color, texture, turgor normal. No rashes or lesions Neurologic: Grossly normal  Labs:   Lab Results  Component Value Date   WBC 4.5 09/18/2015   HGB 12.6* 09/18/2015   HCT 36.8* 09/18/2015   MCV 85.0 09/18/2015   PLT 256 09/18/2015    Recent Labs Lab 09/18/15 1604 09/18/15 1920  NA 131*  --   K 4.7  --   CL 94*  --   CO2 28  --   BUN 7  --   CREATININE 0.76 0.74  CALCIUM 9.4  --   GLUCOSE 163*  --     Lipid Panel     Component Value Date/Time   CHOL 155 11/15/2014 1003   TRIG 85.0 11/15/2014 1003   HDL 39.80 11/15/2014 1003   CHOLHDL 4 11/15/2014 1003   VLDL 17.0 11/15/2014 1003   LDLCALC 98 11/15/2014 1003    BNP (last 3 results) No results for input(s): BNP in the last 8760 hours.  HEMOGLOBIN A1C Lab Results  Component Value Date   HGBA1C 7.6* 09/18/2015   MPG 171 09/18/2015    Cardiac Panel  (last 3 results)  Recent Labs  09/18/15 1920 09/19/15 0359  TROPONINI 0.03 0.03    Lab Results  Component Value Date   TROPONINI 0.03 09/19/2015     TSH No results for input(s): TSH in the last 8760 hours.  EKG 09/19/2015: sinus bradycardia a rate of 55 bpm, normal axis, LAE, borderline first degree AV block, no evidence of ischemia.   Echo: pending, scheduled 09/18/2015   Radiology: No results found.  Scheduled Meds: . aspirin EC  325 mg Oral Daily  . buPROPion  300 mg Oral BH-q7a  . cholecalciferol  5,000 Units Oral Daily  . clonazePAM  1 mg Oral TID  . [START ON 09/21/2015] fentaNYL  100 mcg Transdermal Q72H  . heparin  5,000 Units Subcutaneous 3 times per day  . insulin aspart  0-15 Units Subcutaneous TID WC  . insulin aspart  0-5 Units Subcutaneous QHS  . insulin aspart  4 Units Subcutaneous TID WC  . insulin detemir  5 Units Subcutaneous QHS  . losartan  25 mg Oral Daily  . pantoprazole  40 mg Oral Daily  . rosuvastatin  5 mg Oral q1800  . sertraline  150 mg Oral Daily  . tamsulosin  0.4 mg Oral BID  . zonisamide  150 mg Oral QHS   Continuous Infusions:  PRN Meds:.acetaminophen, albuterol, cyclobenzaprine, gi cocktail, morphine injection, ondansetron (ZOFRAN) IV, oxyCODONE  ASSESSMENT AND PLAN:  1. Angina pectoris 2. PVD s/p aortobifemoral bypass in February 2012  and left carotid endarterectomy in August 2012 3. H/O CVA in 2009 with residual left eye vision loss 4. Controlled type II DM not on long term insulin with peripheral neuropathy 5. Hyperlipidemia 6. Benign essential hypertension 7. Chronic hyponatremia 8. Chronic pain syndrome  Recommendation: He presents with exertional chest pain consistent with angina pectoris associated with dyspnea and diaphoresis. Symptoms subside with rest. He was previously on BB, not on statin therapy as he reported myalgias. Given significant history of PVD and presentation, would recommend proceeding with coronary angiography  and possible angioplasty this afternoon. Can have liquid breakfast then will be made NPO in preparation for cath later today.  Pt with reported anaphylaxis to IVP dye.   Rachel Bo, NP-C 09/19/2015, 8:08 AM [facility]. [place] Pager: [phone] Office: [phone] If no answer Cell [phone]

## 2015-09-19 NOTE — Consult Note (Signed)
CARDIOLOGY CONSULT NOTE  Patient ID: [name] MRN: [id number] DOB/AGE: January 23, 1946 70 y.o.  Admit date: 09/18/2015 Referring Physician: Internal Medicine Primary Physician:   [name], MD Reason for Consultation: Angina  HPI: [name]  is a 70 y.o. 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 He presented to the hospital on 09/18/2015 with reports of 1-2 month history of exertional chest pain, relieved by rest. Also reports associated dyspnea and diaphoresis. He was seen by his PCP who recommended hospitalization for further evaluation.   He is presently on Coreg, not on statin as he states it causes severe myalgias. Previously on fludrocortisone for chronic hyponatremia but has not taken this in several months.  Past Medical History  Diagnosis Date  . DJD (degenerative joint disease)     L wrist  . Lumbar disc disease   . HTN (hypertension)   . Anemia     NOS  . Osteoporosis     tx per [facility], Dr Prudencio Burly, thought due to heavy steriod use after 1978  . Spine fracture     hx, multiple  . Blindness of left eye     near blindness. 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 GERD (gastroesophageal reflux disease)   . DM2 (diabetes mellitus, type 2) ([place])   . Anxiety   . Depression   . BPH (benign prostatic hypertrophy)   . HLD (hyperlipidemia)   . Chronic pain syndrome   . Chronic hyponatremia   . Diabetes mellitus   . Broken foot Oct. 12, 2013    Right foot Fx  . Varicose veins      Past Surgical History  Procedure Laterality Date  . Right hand fracture  1969  . Left ankle ganglion cyst  1976  . Lumbar disc surgery  1981, 2004  . Nasal sinus surgery      multiple- x8. last 1997 with obliteration  . Left cataract  1996    right - 1997   . Left foot surgery  1998    R surgery/fracture - 1999  . Left cts  2006    R CTS - 2007  . Left wrist/hand fusion  2008  . L foot open repair jones fracture  2010     5th metetarsal   . Rotator cuff repair  2006    R, than repeat 2011, Dr Theda Sers  . Vocal surgery  1996  . Repair thoracic aorta    . Trigger finger release Left 11/11/2014    Procedure: LEFT LONG FINGER RELEASE TRIGGER FINGER/A-1 PULLEY;  Surgeon: Milly Jakob, MD;  Location: [facility];  Service: Orthopedics;  Laterality: Left;  Marland Kitchen Eye surgery       Family History  Problem Relation Age of Onset  . Lung cancer Father     and grandmother  . Throat cancer      family hx - "bad living" - also lung CA, heart disease and renal failure    . Diabetes Mother     DM, also sister and MGF      Social History: Social History   Social History  . Marital Status: Divorced    Spouse Name: N/A  . Number of Children: N/A  . Years of Education: N/A   Occupational History  . Not on file.  Social History Main Topics  . Smoking status: Former Smoker -- 2.00 packs/day    Types: Cigarettes    Quit date: 11/06/1988  . Smokeless tobacco: Never Used  . Alcohol Use: No  . Drug Use: No  . Sexual Activity: No   Other Topics Concern  . Not on file   Social History Narrative   Divorced. 2 adult sons - 81 with DM.    Disabled after [place] accident with back injury 1978.      Prescriptions prior to admission  Medication Sig Dispense Refill Last Dose  . BAYER BREEZE 2 TEST DISK USE TO TEST BLOOD GLUCOSE THREE TIMES DAILY 100 each 3 Taking  . Blood Glucose Monitoring Suppl (BAYER BREEZE 2 SYSTEM) W/DEVICE KIT Use to check blood sugars once a day 1 kit 0 Taking  . buPROPion (WELLBUTRIN XL) 300 MG 24 hr tablet Take 300 mg by mouth every morning.    09/18/2015 at Unknown time  . carvedilol (COREG) 6.25 MG tablet Take 1 tablet by mouth daily.   09/18/2015 at 0800  . celecoxib (CELEBREX) 200 MG capsule Take 200 mg  by mouth 2 (two) times daily.   1 09/18/2015 at Unknown time  . Cholecalciferol (VITAMIN D) 1000 UNITS capsule Take 5,000 Units by mouth daily.    Past Week at Unknown time  . clonazePAM (KLONOPIN) 1 MG tablet Take 1 mg by mouth 3 (three) times daily.    09/18/2015 at Unknown time  . clonazePAM (KLONOPIN) 1 MG tablet Take 1 tablet by mouth 3 (three) times daily.     . cyclobenzaprine (FLEXERIL) 10 MG tablet   0 09/18/2015 at Unknown time  . fentaNYL (DURAGESIC - DOSED MCG/HR) 100 MCG/HR Place 1 patch onto the skin every 3 (three) days.   09/18/2015 at Unknown time  . fentaNYL (DURAGESIC - DOSED MCG/HR) 25 MCG/HR patch Place 1 patch onto the skin every 3 (three) days.     Marland Kitchen [place]    Taking  . omeprazole (PRILOSEC) 40 MG capsule Take 1 capsule by mouth 2 (two) times daily.   09/18/2015 at Unknown time  . Oral Electrolytes (BUFFERED SALT) TABS Take by mouth daily. Therma Tabs   Past Week at Unknown time  . ORAL ELECTROLYTES PO Take 6-8 tablets by mouth.   Past Week at Unknown time  . oxyCODONE (ROXICODONE) 15 MG immediate release tablet continuous as needed for pain.    09/18/2015 at Unknown time  . PROAIR HFA 108 (90 BASE) MCG/ACT inhaler    09/17/2015 at Unknown time  . sitaGLIPtin (JANUVIA) 100 MG tablet Take 1 tablet (100 mg total) by mouth daily. 30 tablet 3 09/18/2015 at Unknown time  . tamsulosin (FLOMAX) 0.4 MG CAPS capsule Take 1 capsule by mouth 2 (two) times daily.   09/18/2015 at Unknown time  . VITAMIN E PO Take 8,000 Units by mouth daily.   09/17/2015 at Unknown time  . zolpidem (AMBIEN) 10 MG tablet Take 1 tablet by mouth as needed. For sleep `   Past Week at Unknown time  . zonisamide (ZONEGRAN) 50 MG capsule Take 3 capsules by mouth at bedtime.   09/17/2015 at Unknown time  . fludrocortisone (FLORINEF) 0.1 MG tablet Take 1 tablet every day (Patient not taking: Reported on 03/24/2015) 30 tablet 3 Not Taking at Unknown time     ROS: General: no fevers/chills/night sweats Eyes: Left eye  vision loss after CVA in 2009 ENT: no sore throat or hearing loss Resp: Reports SOB  associated with chest pain; no cough, wheezing, or hemoptysis CV: Reports chest pain; no edema or palpitations GI: no abdominal pain, nausea, vomiting, diarrhea, or constipation GU: no dysuria, frequency, or hematuria Skin: no rash Neuro: no headache, numbness, tingling, or weakness of extremities Musculoskeletal: no joint pain or swelling Heme: no bleeding, DVT, or easy bruising Endo: no polydipsia or polyuria    Physical Exam: Blood pressure 93/54, pulse 60, temperature 98.2 F (36.8 C), temperature source Oral, resp. rate 18, height 6' (1.829 m), weight 94.938 kg (209 lb 4.8 oz), SpO2 92 %.   General appearance: alert, cooperative, appears stated age and no distress Neck: bilateral carotid bruit Lungs: clear to auscultation bilaterally Chest wall: no tenderness Heart: regular rate and rhythm, S1, S2 normal, no murmur, click, rub or gallop Extremities: extremities normal, atraumatic, no cyanosis or edema Pulses: 2+ and symmetric bilateral soft femoral bruits Skin: Skin color, texture, turgor normal. No rashes or lesions Neurologic: Grossly normal  Labs:   Lab Results  Component Value Date   WBC 4.5 09/18/2015   HGB 12.6* 09/18/2015   HCT 36.8* 09/18/2015   MCV 85.0 09/18/2015   PLT 256 09/18/2015    Recent Labs Lab 09/18/15 1604 09/18/15 1920  NA 131*  --   K 4.7  --   CL 94*  --   CO2 28  --   BUN 7  --   CREATININE 0.76 0.74  CALCIUM 9.4  --   GLUCOSE 163*  --     Lipid Panel     Component Value Date/Time   CHOL 155 11/15/2014 1003   TRIG 85.0 11/15/2014 1003   HDL 39.80 11/15/2014 1003   CHOLHDL 4 11/15/2014 1003   VLDL 17.0 11/15/2014 1003   LDLCALC 98 11/15/2014 1003    BNP (last 3 results) No results for input(s): BNP in the last 8760 hours.  HEMOGLOBIN A1C Lab Results  Component Value Date   HGBA1C 7.6* 09/18/2015   MPG 171 09/18/2015    Cardiac Panel  (last 3 results)  Recent Labs  09/18/15 1920 09/19/15 0359  TROPONINI 0.03 0.03    Lab Results  Component Value Date   TROPONINI 0.03 09/19/2015     TSH No results for input(s): TSH in the last 8760 hours.  EKG 09/19/2015: sinus bradycardia a rate of 55 bpm, normal axis, LAE, borderline first degree AV block, no evidence of ischemia.   Echo: pending, scheduled 09/18/2015   Radiology: No results found.  Scheduled Meds: . aspirin EC  325 mg Oral Daily  . buPROPion  300 mg Oral BH-q7a  . cholecalciferol  5,000 Units Oral Daily  . clonazePAM  1 mg Oral TID  . [START ON 09/21/2015] fentaNYL  100 mcg Transdermal Q72H  . heparin  5,000 Units Subcutaneous 3 times per day  . insulin aspart  0-15 Units Subcutaneous TID WC  . insulin aspart  0-5 Units Subcutaneous QHS  . insulin aspart  4 Units Subcutaneous TID WC  . insulin detemir  5 Units Subcutaneous QHS  . losartan  25 mg Oral Daily  . pantoprazole  40 mg Oral Daily  . rosuvastatin  5 mg Oral q1800  . sertraline  150 mg Oral Daily  . tamsulosin  0.4 mg Oral BID  . zonisamide  150 mg Oral QHS   Continuous Infusions:  PRN Meds:.acetaminophen, albuterol, cyclobenzaprine, gi cocktail, morphine injection, ondansetron (ZOFRAN) IV, oxyCODONE  ASSESSMENT AND PLAN:  1. Angina pectoris 2. PVD s/p aortobifemoral bypass in February 2012  and left carotid endarterectomy in August 2012 3. H/O CVA in 2009 with residual left eye vision loss 4. Controlled type II DM not on long term insulin with peripheral neuropathy 5. Hyperlipidemia 6. Benign essential hypertension 7. Chronic hyponatremia 8. Chronic pain syndrome  Recommendation: He presents with exertional chest pain consistent with angina pectoris associated with dyspnea and diaphoresis. Symptoms subside with rest. He was previously on BB, not on statin therapy as he reported myalgias. Given significant history of PVD and presentation, would recommend proceeding with coronary angiography  and possible angioplasty this afternoon. Can have liquid breakfast then will be made NPO in preparation for cath later today.  Pt with reported anaphylaxis to IVP dye.   Rachel Bo, NP-C 09/19/2015, 8:08 AM [facility]. [place] Pager: [phone] Office: [phone] If no answer Cell [phone]

## 2015-09-19 NOTE — Consult Note (Addendum)
[address]       [place] [address]             [phone]        [name] [place] Medical Record #[id number] Date of Birth: 30-Nov-1945  Referring: No ref. provider found Primary Care:  [name], MD  Chief Complaint:    Chief Complaint  Patient presents with  . Chest Pain    History of Present Illness:   HPI: Kaydin Karbowski is a 70 y.o. Male with  past medical history of diabetes mellitus type 2 with last A1c of 6.9, chronic hyponatremia, history of tobacco abuse qiut in 1990 , essential hypertension, peripheral vascular disease that comes in for typical chest pain that started about a month ago. He relates that every time he tries to mow the lawn or goes for a walk he chest pain that improved after 5 minutes of sitting down. With this he also gets shortness of breath and profuse sweating. He relates sometimes the pain radiates to his left shoulder when he develops the chest pain. But denies any nausea vomiting. He went to see his primary care doctor on the day of admission referred him to the ED. Chest x-ray was done in the PCPs office that shows no acute finding.  In the ED admitted 2/2, cathed last night after 6pm First set of cardiac enzymes was negative    Patient has history of left greater saphenous vein laser ablation and right small saphenous vein laser ablation    Current Activity/ Functional Status: Patient is independent with mobility/ambulation, transfers, ADL's, IADL's.   Zubrod Score: At the time of surgery this patient's most appropriate activity status/level should be described as: _0     0    Normal activity, no symptoms _1     1    Restricted in physical strenuous activity but ambulatory, able to do out light work _2     2    Ambulatory and capable of self care, unable to do work activities, up and about                 more than 50%  Of the time                            _3     3    Only limited self care, in bed greater than 50%  of waking hours _4     4    Completely disabled, no self care, confined to bed or chair _5     5    Moribund  Past Medical History  Diagnosis Date  . DJD (degenerative joint disease)     L wrist  . Lumbar disc disease   . HTN (hypertension)   . Anemia     NOS  . Osteoporosis     tx per [facility], Dr Prudencio Burly, thought due to heavy steriod use after 1978  . Spine fracture     hx, multiple  . Blindness of left eye     near blindness. 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 GERD (gastroesophageal reflux disease)   . DM2 (diabetes mellitus, type 2) ([place])   . Anxiety   . Depression   . BPH (benign prostatic hypertrophy)   . HLD (hyperlipidemia)   . Chronic pain syndrome   . Chronic hyponatremia   . Diabetes mellitus   . Broken foot Oct. 12, 2013    Right foot Fx  .  Varicose veins     Past Surgical History  Procedure Laterality Date  . Right hand fracture  1969  . Left ankle ganglion cyst  1976  . Lumbar disc surgery  1981, 2004  . Nasal sinus surgery      multiple- x8. last 1997 with obliteration  . Left cataract  1996    right - 1997  . Left foot surgery  1998    R surgery/fracture - 1999  . Left cts  2006    R CTS - 2007  . Left wrist/hand fusion  2008  . L foot open repair jones fracture  2010     5th metetarsal   . Rotator cuff repair  2006    R, than repeat 2011, Dr Theda Sers  . Vocal surgery  1996  . Repair thoracic aorta    . Trigger finger release Left 11/11/2014    Procedure: LEFT LONG FINGER RELEASE TRIGGER FINGER/A-1 PULLEY;  Surgeon: Milly Jakob, MD;  Location: [facility];  Service: Orthopedics;  Laterality: Left;  Marland Kitchen Eye surgery      History  Smoking status  . Former Smoker -- 2.00 packs/day  . Types: Cigarettes  . Quit date: 11/06/1988  Smokeless tobacco  . Never Used    History  Alcohol Use No    Social History   Social History  . Marital Status: Divorced    Spouse Name: N/A  . Number of Children: N/A  . Years of Education: N/A   Occupational History   . Not on file.   Social History Main Topics  . Smoking status: Former Smoker -- 2.00 packs/day    Types: Cigarettes    Quit date: 11/06/1988  . Smokeless tobacco: Never Used  . Alcohol Use: No  . Drug Use: No  . Sexual Activity: No   Other Topics Concern  . Not on file   Social History Narrative   Divorced. 2 adult sons - 37 with DM.    Disabled after [place] accident with back injury 1978.     Allergies  Allergen Reactions  . Ivp Dye [Iodinated Diagnostic Agents]     Blood Pressure dropped----pt was pre-medicated with 13 hour prep and did fine with pre-meds--amy 03/09/11   . Penicillins Nausea Only    REACTION: gi upset  . Sulfonamide Derivatives Hives and Itching  . Sulfa Antibiotics Hives  . Propoxyphene N-Acetaminophen Other (See Comments)    Sharp pains- headache    Current Facility-Administered Medications  Medication Dose Route Frequency Provider Last Rate Last Dose  . 0.9 %  sodium chloride infusion  250 mL Intravenous PRN Adrian Prows, MD      . 0.9% sodium chloride infusion  3 mL/kg/hr Intravenous Continuous Adrian Prows, MD 284.7 mL/hr at 09/19/15 1815 3 mL/kg/hr at 09/19/15 1815  . acetaminophen (TYLENOL) tablet 650 mg  650 mg Oral Q4H PRN Charlynne Cousins, MD      . albuterol (PROVENTIL) (2.5 MG/3ML) 0.083% nebulizer solution 2.5 mg  2.5 mg Nebulization Q4H PRN Charlynne Cousins, MD      . aspirin EC tablet 325 mg  325 mg Oral Daily Charlynne Cousins, MD   325 mg at 09/19/15 1101  . [START ON 09/20/2015] atorvastatin (LIPITOR) tablet 80 mg  80 mg Oral Q0600 Adrian Prows, MD      . buPROPion (WELLBUTRIN XL) 24 hr tablet 300 mg  300 mg Oral BH-q7a Charlynne Cousins, MD   300 mg at 09/19/15 8299  . cholecalciferol (  VITAMIN D) tablet 5,000 Units  5,000 Units Oral Daily Charlynne Cousins, MD      . clonazePAM [facility]) tablet 1 mg  1 mg Oral TID Charlynne Cousins, MD   1 mg at 09/19/15 1100  . cyclobenzaprine (FLEXERIL) tablet 10 mg  10 mg Oral TID PRN  Charlynne Cousins, MD      . feeding supplement (ENSURE ENLIVE) (ENSURE ENLIVE) liquid 237 mL  237 mL Oral BID BM Delfina Redwood, MD      . Derrill Memo ON 09/21/2015] fentaNYL (DURAGESIC - dosed mcg/hr) 100 mcg  100 mcg Transdermal Q72H Charlynne Cousins, MD      . gi cocktail (Maalox,Lidocaine,Donnatal)  30 mL Oral QID PRN Charlynne Cousins, MD      . heparin injection 5,000 Units  5,000 Units Subcutaneous 3 times per day Charlynne Cousins, MD   5,000 Units at 09/19/15 0612  . insulin aspart (novoLOG) injection 0-15 Units  0-15 Units Subcutaneous TID WC Charlynne Cousins, MD   0 Units at 09/19/15 0800  . insulin aspart (novoLOG) injection 0-5 Units  0-5 Units Subcutaneous QHS Charlynne Cousins, MD   0 Units at 09/18/15 2200  . insulin aspart (novoLOG) injection 4 Units  4 Units Subcutaneous TID WC Charlynne Cousins, MD   4 Units at 09/19/15 0800  . insulin detemir (LEVEMIR) injection 5 Units  5 Units Subcutaneous QHS Charlynne Cousins, MD   5 Units at 09/18/15 2317  . losartan (COZAAR) tablet 25 mg  25 mg Oral Daily Charlynne Cousins, MD   25 mg at 09/19/15 1101  . morphine 2 MG/ML injection 2 mg  2 mg Intravenous Q2H PRN Charlynne Cousins, MD      . ondansetron [facility]) injection 4 mg  4 mg Intravenous Q6H PRN Charlynne Cousins, MD      . oxyCODONE (Oxy IR/ROXICODONE) immediate release tablet 15 mg  15 mg Oral Q4H PRN Charlynne Cousins, MD   15 mg at 09/19/15 0841  . pantoprazole (PROTONIX) EC tablet 40 mg  40 mg Oral Daily Charlynne Cousins, MD   40 mg at 09/19/15 1100  . sodium chloride flush (NS) 0.9 % injection 3 mL  3 mL Intravenous Q12H Adrian Prows, MD      . sodium chloride flush (NS) 0.9 % injection 3 mL  3 mL Intravenous PRN Adrian Prows, MD      . tamsulosin (FLOMAX) capsule 0.4 mg  0.4 mg Oral BID Charlynne Cousins, MD   0.4 mg at 09/19/15 1100  . zonisamide (ZONEGRAN) capsule 150 mg  150 mg Oral QHS Charlynne Cousins, MD   150 mg at 09/18/15 2135     Prescriptions prior to admission  Medication Sig Dispense Refill Last Dose  . BAYER BREEZE 2 TEST DISK USE TO TEST BLOOD GLUCOSE THREE TIMES DAILY 100 each 3 Taking  . Blood Glucose Monitoring Suppl (BAYER BREEZE 2 SYSTEM) W/DEVICE KIT Use to check blood sugars once a day 1 kit 0 Taking  . buPROPion (WELLBUTRIN XL) 300 MG 24 hr tablet Take 300 mg by mouth every morning.    09/18/2015 at Unknown time  . carvedilol (COREG) 6.25 MG tablet Take 1 tablet by mouth daily.   09/18/2015 at 0800  . celecoxib (CELEBREX) 200 MG capsule Take 200 mg by mouth 2 (two) times daily.   1 09/18/2015 at Unknown time  . Cholecalciferol (VITAMIN D) 1000 UNITS capsule Take 5,000 Units by mouth  daily.    Past Week at Unknown time  . clonazePAM (KLONOPIN) 1 MG tablet Take 1 mg by mouth 3 (three) times daily.    09/18/2015 at Unknown time  . clonazePAM (KLONOPIN) 1 MG tablet Take 1 tablet by mouth 3 (three) times daily.     . cyclobenzaprine (FLEXERIL) 10 MG tablet   0 09/18/2015 at Unknown time  . fentaNYL (DURAGESIC - DOSED MCG/HR) 100 MCG/HR Place 1 patch onto the skin every 3 (three) days.   09/18/2015 at Unknown time  . fentaNYL (DURAGESIC - DOSED MCG/HR) 25 MCG/HR patch Place 1 patch onto the skin every 3 (three) days.     Marland Kitchen [place]    Taking  . omeprazole (PRILOSEC) 40 MG capsule Take 1 capsule by mouth 2 (two) times daily.   09/18/2015 at Unknown time  . Oral Electrolytes (BUFFERED SALT) TABS Take by mouth daily. Therma Tabs   Past Week at Unknown time  . ORAL ELECTROLYTES PO Take 6-8 tablets by mouth.   Past Week at Unknown time  . oxyCODONE (ROXICODONE) 15 MG immediate release tablet continuous as needed for pain.    09/18/2015 at Unknown time  . PROAIR HFA 108 (90 BASE) MCG/ACT inhaler    09/17/2015 at Unknown time  . sitaGLIPtin (JANUVIA) 100 MG tablet Take 1 tablet (100 mg total) by mouth daily. 30 tablet 3 09/18/2015 at Unknown time  . tamsulosin (FLOMAX) 0.4 MG CAPS capsule Take 1 capsule by mouth 2 (two)  times daily.   09/18/2015 at Unknown time  . VITAMIN E PO Take 8,000 Units by mouth daily.   09/17/2015 at Unknown time  . zolpidem (AMBIEN) 10 MG tablet Take 1 tablet by mouth as needed. For sleep `   Past Week at Unknown time  . zonisamide (ZONEGRAN) 50 MG capsule Take 3 capsules by mouth at bedtime.   09/17/2015 at Unknown time  . fludrocortisone (FLORINEF) 0.1 MG tablet Take 1 tablet every day (Patient not taking: Reported on 03/24/2015) 30 tablet 3 Not Taking at Unknown time    Family History  Problem Relation Age of Onset  . Lung cancer Father     and grandmother  . Throat cancer      family hx - "bad living" - also lung CA, heart disease and renal failure    . Diabetes Mother     DM, also sister and MGF      Review of Systems:      Cardiac Review of Systems: Y or N  Chest Pain [  y  ]  Resting SOB [  n ] Exertional SOB  Blue.Reese  ]  Orthopnea Florencio.Farrier  ]   Pedal Edema [ n  ]    Palpitations [ n ] Syncope  [ n ]   Presyncope [ n  ]  General Review of Systems: [Y] = yes [  ]=no Constitional: recent weight change [ n ]; anorexia [  ]; fatigue [  ]; nausea [  ]; night sweats [  ]; fever [  ]; or chills [  ]                                                               Dental: poor dentition[  ]; Last Dentist visit:  Eye : blurred vision [  ]; diplopia [   ]; vision changes [  ];  Amaurosis fugax[  ]; Resp: cough [  ];  wheezing[  ];  hemoptysis[  ]; shortness of breath[  ]; paroxysmal nocturnal dyspnea[  ]; dyspnea on exertion[ y ]; or orthopnea[  ];  GI:  gallstones[  ], vomiting[  ];  dysphagia[  ]; melena[  ];  hematochezia [  ]; heartburn[  ];   Hx of  Colonoscopy[  ]; GU: kidney stones [  ]; hematuria[  ];   dysuria [  ];  nocturia[  ];  history of     obstruction [  ]; urinary frequency [  ]             Skin: rash, swelling[  ];, hair loss[  ];  peripheral edema[  ];  or itching[  ]; Musculosketetal: myalgias[  ];  joint swelling[  ];  joint erythema[  ];  joint pain[  ];  back pain[   ];  Heme/Lymph: bruising[  ];  bleeding[  ];  anemia[  ];  Neuro: TIA[  n];  headaches[  ];  stroke[  ];  vertigo[  ];  seizures[  ];   paresthesias[  ];  difficulty walking[y ];  Psych:depression[  ]; anxiety[  ];  Endocrine: diabetes[  ];  thyroid dysfunction[  ];  Immunizations: Flu [ n ]; Pneumococcal[n  ];  Other:  Physical Exam: BP 156/67 mmHg  Pulse 67  Temp(Src) 98.1 F (36.7 C) (Oral)  Resp 0  Ht 6' (1.829 m)  Wt 209 lb 4.8 oz (94.938 kg)  BMI 28.38 kg/m2  SpO2 94%   General appearance: alert, cooperative, appears older than stated age and no distress Head: Normocephalic, without obvious abnormality, atraumatic Neck: no adenopathy, no carotid bruit, no JVD, supple, symmetrical, trachea midline and thyroid not enlarged, symmetric, no tenderness/mass/nodules Lymph nodes: Cervical, supraclavicular, and axillary nodes normal. Resp: clear to auscultation bilaterally Back: symmetric, no curvature. ROM normal. No CVA tenderness. Cardio: regular rate and rhythm, S1, S2 normal, no murmur, click, rub or gallop GI: soft, non-tender; bowel sounds normal; no masses,  no organomegaly Extremities: varicose veins both legs, cathed right wrist , decreased but present dp and pt pulses bilaterial Neurologic: Grossly normal Midline abdominal incision and bilateral groin incision from aortobifemoral     Diagnostic Studies & Laboratory data:     Recent Radiology Findings:   No results found.   I have independently reviewed the above radiologic studies.  Recent Lab Findings: Lab Results  Component Value Date   WBC 4.5 09/18/2015   HGB 12.6* 09/18/2015   HCT 36.8* 09/18/2015   PLT 256 09/18/2015   GLUCOSE 163* 09/18/2015   CHOL 155 11/15/2014   TRIG 85.0 11/15/2014   HDL 39.80 11/15/2014   LDLDIRECT 108.2 03/13/2010   LDLCALC 98 11/15/2014   ALT 21 11/15/2014   AST 29 11/15/2014   NA 131* 09/18/2015   K 4.7 09/18/2015   CL 94* 09/18/2015   CREATININE 0.74 09/18/2015   BUN  7 09/18/2015   CO2 28 09/18/2015   TSH 2.698 03/12/2014   INR 1.12 09/19/2015   HGBA1C 7.6* 09/18/2015   ECHO:09/19/2015 LV EF: 50% -  55%  ------------------------------------------------------------------- Indications:   Chest pain 786.51.  ------------------------------------------------------------------- Study Conclusions  - Left ventricle: The cavity size was normal. Wall thickness was normal. Systolic function was normal. The estimated ejection fraction was in the range of 50% to 55%. Features  are consistent with a pseudonormal left ventricular filling pattern, with concomitant abnormal relaxation and increased filling pressure (grade 2 diastolic dysfunction). - Left atrium: The atrium was severely dilated. - Right atrium: The atrium was moderately dilated. - Pulmonary arteries: Systolic pressure was mildly increased. PA peak pressure: 37 mm Hg (S).  Transthoracic echocardiography. M-mode, complete 2D, spectral Doppler, and color Doppler. Birthdate: Patient birthdate: [birth year]. Age: Patient is 70 yr old. Sex: Gender: male. BMI: 28.3 kg/m^2. Blood pressure:   103/57 Patient status: Inpatient. Study date: Study date: 09/19/2015. Study time: 02:52 PM. Location: Echo laboratory.  -------------------------------------------------------------------  ------------------------------------------------------------------- Left ventricle: The cavity size was normal. Wall thickness was normal. Systolic function was normal. The estimated ejection fraction was in the range of 50% to 55%. Features are consistent with a pseudonormal left ventricular filling pattern, with concomitant abnormal relaxation and increased filling pressure (grade 2 diastolic dysfunction).  ------------------------------------------------------------------- Aortic valve:  Structurally normal valve.  Cusp separation was normal. Doppler: Transvalvular velocity was within  the normal range. There was no stenosis. There was no regurgitation.  ------------------------------------------------------------------- Aorta: Aortic root: The aortic root was normal in size. Ascending aorta: The ascending aorta was normal in size.  ------------------------------------------------------------------- Mitral valve:  Structurally normal valve.  Leaflet separation was normal. Doppler: Transvalvular velocity was within the normal range. There was no evidence for stenosis. There was no regurgitation.  Peak gradient (D): 2 mm Hg.  ------------------------------------------------------------------- Left atrium: The atrium was severely dilated.  ------------------------------------------------------------------- Right ventricle: The cavity size was normal. Systolic function was normal.  ------------------------------------------------------------------- Pulmonic valve:  The valve appears to be grossly normal. Doppler: There was no significant regurgitation.  ------------------------------------------------------------------- Tricuspid valve:  Structurally normal valve.  Leaflet separation was normal. Doppler: Transvalvular velocity was within the normal range. There was trivial regurgitation.  ------------------------------------------------------------------- Pulmonary artery:  Systolic pressure was mildly increased.  ------------------------------------------------------------------- Right atrium: The atrium was moderately dilated.  ------------------------------------------------------------------- Pericardium: There was no pericardial effusion.  ------------------------------------------------------------------- Measurements  Left ventricle               Value    Reference LV ID, ED, PLAX chordal     (H)    59.2 mm   43 - 52 LV ID, ES, PLAX chordal     (H)    50  mm   23 - 38 LV fx shortening,  PLAX chordal  (L)    16  %   >=29 LV PW thickness, ED            9.86 mm   --------- IVS/LV PW ratio, ED            0.62     <=1.3 LV ejection fraction, 1-p A4C       59  %   --------- LV end-diastolic volume, 2-p        106  ml   --------- LV end-systolic volume, 2-p        45  ml   --------- LV ejection fraction, 2-p         57  %   --------- Stroke volume, 2-p             61  ml   --------- LV end-diastolic volume/bsa, 2-p      48  ml/m^2 --------- LV end-systolic volume/bsa, 2-p      20  ml/m^2 --------- Stroke volume/bsa, 2-p           27.5 ml/m^2 --------- LV e&', lateral  12.1 cm/s  --------- LV E/e&', lateral              6.45     --------- LV e&', medial               7.07 cm/s  --------- LV E/e&', medial              11.05    --------- LV e&', average               9.59 cm/s  --------- LV E/e&', average              8.15     ---------  Ventricular septum             Value    Reference IVS thickness, ED             6.11 mm   ---------  Aorta                   Value    Reference Aortic root ID, ED             30  mm   ---------  Left atrium                Value    Reference LA ID, A-P, ES               49  mm   --------- LA ID/bsa, A-P          (H)    2.21 cm/m^2 <=2.2 LA volume, S                105  ml   --------- LA volume/bsa, S              47.5 ml/m^2 --------- LA volume, ES, 1-p A4C           96  ml   --------- LA volume/bsa, ES, 1-p A4C         43.4 ml/m^2 --------- LA volume, ES, 1-p A2C           102  ml    --------- LA volume/bsa, ES, 1-p A2C         46.1 ml/m^2 ---------  Mitral valve                Value    Reference Mitral E-wave peak velocity        78.1 cm/s  --------- Mitral A-wave peak velocity        91.2 cm/s  --------- Mitral deceleration time     (H)    317  ms   150 - 230 Mitral peak gradient, D          2   mm Hg --------- Mitral E/A ratio, peak           0.9     ---------  Pulmonary arteries             Value    Reference PA pressure, S, DP        (H)    37  mm Hg <=30  Right ventricle              Value    Reference RV s&', lateral, S             15  cm/s  ---------  Legend: (L) and (H) mark values outside specified reference range.  ------------------------------------------------------------------- Prepared and Electronically Authenticated by  Mertie Moores, M.D. 2017-02-03T16:48:18  CATH:  09/19/3015 6:30 pm Dominance: Right. Mild aortic root calcification and diffuse coronary calcification is evident.  Left Main   . Ost LM lesion, 60% stenosed. The lesion is type C Calcified.  . LM lesion, 80% stenosed. The lesion is type C Diffuse. Cauliflower-like lesion in the distal LM   Left Anterior Descending   . Prox LAD to Mid LAD lesion, 95% stenosed. The lesion is type C Calcified. Cauliflower-like calcific stenosis.   Ramus Intermedius Vessel is small.   Left Circumflex   . Ost Cx lesion, 90% stenosed. The lesion is type C Calcified.  . Prox Cx to Mid Cx lesion, 95% stenosed. The lesion is type C Calcified.   Right Coronary Artery   . Prox RCA lesion, 70% stenosed. Calcified.  . Prox RCA to Mid RCA lesion, 70% stenosed. The lesion is type C Calcified. Cauliflower-like tandem calcific stenosis from proximal to mid RCA, Anomalous Circumflex branch and AV nodal branch from proximal  RCA present. Large PDA and PL branches.  LV gram: Normal LV systolic function, no significant mitral regurgitation. Mitral valve annulus normal. Left subclavian artery/LIMA: Widely patent. Right subclavian artery shows a midsegment large calcific cauliflower-like [place] stenosis of 70-80% or more. There was a 60 mm pressure difference between the right and left arm. However the right subclavian artery itself was patent with antegrade injection. 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 Recommendation: I have consulted Dr. Servando Snare to evaluate the patient for CABG.    I have independently reviewed the above  cath films and reviewed the findings with the  patient .   Lab Results  Component Value Date   TROPONINI <0.03 09/19/2015   Assessment / Plan:    3 vessel with left pain disease and unstable angina, consider CABG. Will evaluate conduit with vein mapping as has had bilateral laser ablations of saphenous veins Chronic Narcotic usage via pain clinic for facial pain  Left Laser Ablation of the greater saphenous vein Right Small Saphenous Vein laser ablation Previous left CEA Right subclavian stenosis  Get pre cabg dopplers to evaluate subclavian , carotids, and vertebrales   I  spent 40 minutes counseling the patient face to face and 50% or more the  time was spent in counseling and coordination of care. The total time spent in the appointment was 60 minutes.    [name] MD      [address] [place] [address] Office [phone]   Beeper [phone]  09/19/2015 6:50 PM

## 2015-09-20 ENCOUNTER — Inpatient Hospital Stay (HOSPITAL_COMMUNITY): Payer: Medicare Other

## 2015-09-20 DIAGNOSIS — I2510 Atherosclerotic heart disease of native coronary artery without angina pectoris: Secondary | ICD-10-CM

## 2015-09-20 DIAGNOSIS — H540 Blindness, both eyes: Secondary | ICD-10-CM | POA: Diagnosis present

## 2015-09-20 DIAGNOSIS — E871 Hypo-osmolality and hyponatremia: Secondary | ICD-10-CM | POA: Diagnosis present

## 2015-09-20 DIAGNOSIS — Z8673 Personal history of transient ischemic attack (TIA), and cerebral infarction without residual deficits: Secondary | ICD-10-CM | POA: Diagnosis not present

## 2015-09-20 DIAGNOSIS — I10 Essential (primary) hypertension: Secondary | ICD-10-CM | POA: Diagnosis present

## 2015-09-20 DIAGNOSIS — M810 Age-related osteoporosis without current pathological fracture: Secondary | ICD-10-CM | POA: Diagnosis present

## 2015-09-20 DIAGNOSIS — F1123 Opioid dependence with withdrawal: Secondary | ICD-10-CM | POA: Diagnosis present

## 2015-09-20 DIAGNOSIS — I200 Unstable angina: Secondary | ICD-10-CM | POA: Diagnosis present

## 2015-09-20 DIAGNOSIS — Z87891 Personal history of nicotine dependence: Secondary | ICD-10-CM | POA: Diagnosis not present

## 2015-09-20 DIAGNOSIS — N401 Enlarged prostate with lower urinary tract symptoms: Secondary | ICD-10-CM | POA: Diagnosis present

## 2015-09-20 DIAGNOSIS — F329 Major depressive disorder, single episode, unspecified: Secondary | ICD-10-CM | POA: Diagnosis present

## 2015-09-20 DIAGNOSIS — E1151 Type 2 diabetes mellitus with diabetic peripheral angiopathy without gangrene: Secondary | ICD-10-CM | POA: Diagnosis present

## 2015-09-20 DIAGNOSIS — I4891 Unspecified atrial fibrillation: Secondary | ICD-10-CM | POA: Diagnosis not present

## 2015-09-20 DIAGNOSIS — Z886 Allergy status to analgesic agent status: Secondary | ICD-10-CM | POA: Diagnosis not present

## 2015-09-20 DIAGNOSIS — Z880 Allergy status to penicillin: Secondary | ICD-10-CM | POA: Diagnosis not present

## 2015-09-20 DIAGNOSIS — E785 Hyperlipidemia, unspecified: Secondary | ICD-10-CM | POA: Diagnosis present

## 2015-09-20 DIAGNOSIS — Z882 Allergy status to sulfonamides status: Secondary | ICD-10-CM | POA: Diagnosis not present

## 2015-09-20 DIAGNOSIS — I708 Atherosclerosis of other arteries: Secondary | ICD-10-CM | POA: Diagnosis present

## 2015-09-20 DIAGNOSIS — I25110 Atherosclerotic heart disease of native coronary artery with unstable angina pectoris: Secondary | ICD-10-CM | POA: Diagnosis present

## 2015-09-20 DIAGNOSIS — R338 Other retention of urine: Secondary | ICD-10-CM | POA: Diagnosis not present

## 2015-09-20 DIAGNOSIS — Z7982 Long term (current) use of aspirin: Secondary | ICD-10-CM | POA: Diagnosis not present

## 2015-09-20 DIAGNOSIS — Z79899 Other long term (current) drug therapy: Secondary | ICD-10-CM | POA: Diagnosis not present

## 2015-09-20 DIAGNOSIS — G894 Chronic pain syndrome: Secondary | ICD-10-CM | POA: Diagnosis present

## 2015-09-20 DIAGNOSIS — F419 Anxiety disorder, unspecified: Secondary | ICD-10-CM | POA: Diagnosis present

## 2015-09-20 DIAGNOSIS — E1142 Type 2 diabetes mellitus with diabetic polyneuropathy: Secondary | ICD-10-CM | POA: Diagnosis present

## 2015-09-20 DIAGNOSIS — K219 Gastro-esophageal reflux disease without esophagitis: Secondary | ICD-10-CM | POA: Diagnosis present

## 2015-09-20 DIAGNOSIS — R079 Chest pain, unspecified: Secondary | ICD-10-CM | POA: Diagnosis present

## 2015-09-20 DIAGNOSIS — D62 Acute posthemorrhagic anemia: Secondary | ICD-10-CM | POA: Diagnosis not present

## 2015-09-20 DIAGNOSIS — E559 Vitamin D deficiency, unspecified: Secondary | ICD-10-CM | POA: Diagnosis present

## 2015-09-20 DIAGNOSIS — Z91041 Radiographic dye allergy status: Secondary | ICD-10-CM | POA: Diagnosis not present

## 2015-09-20 DIAGNOSIS — I771 Stricture of artery: Secondary | ICD-10-CM | POA: Diagnosis present

## 2015-09-20 LAB — HEPARIN LEVEL (UNFRACTIONATED)
Heparin Unfractionated: 0.21 IU/mL — ABNORMAL LOW (ref 0.30–0.70)
Heparin Unfractionated: 0.5 IU/mL (ref 0.30–0.70)

## 2015-09-20 LAB — CBC
HEMATOCRIT: 35.6 % — AB (ref 39.0–52.0)
HEMOGLOBIN: 11.9 g/dL — AB (ref 13.0–17.0)
MCH: 28.8 pg (ref 26.0–34.0)
MCHC: 33.4 g/dL (ref 30.0–36.0)
MCV: 86.2 fL (ref 78.0–100.0)
Platelets: 253 10*3/uL (ref 150–400)
RBC: 4.13 MIL/uL — ABNORMAL LOW (ref 4.22–5.81)
RDW: 13.8 % (ref 11.5–15.5)
WBC: 4.8 10*3/uL (ref 4.0–10.5)

## 2015-09-20 LAB — GLUCOSE, CAPILLARY
GLUCOSE-CAPILLARY: 113 mg/dL — AB (ref 65–99)
GLUCOSE-CAPILLARY: 113 mg/dL — AB (ref 65–99)
Glucose-Capillary: 138 mg/dL — ABNORMAL HIGH (ref 65–99)
Glucose-Capillary: 146 mg/dL — ABNORMAL HIGH (ref 65–99)

## 2015-09-20 MED ORDER — POLYETHYLENE GLYCOL 3350 17 G PO PACK
17.0000 g | PACK | Freq: Every day | ORAL | Status: DC
  Administered 2015-09-21: 17 g via ORAL
  Filled 2015-09-20: qty 1

## 2015-09-20 NOTE — Progress Notes (Signed)
ANTICOAGULATION CONSULT NOTE - Follow Up Consult  Pharmacy Consult for Heparin  Indication: chest pain/ACS  Allergies  Allergen Reactions  . Ivp Dye [Iodinated Diagnostic Agents]     Blood Pressure dropped----pt was pre-medicated with 13 hour prep and did fine with pre-meds--amy 03/09/11   . Penicillins Nausea Only    REACTION: gi upset  . Sulfonamide Derivatives Hives and Itching  . Sulfa Antibiotics Hives  . Propoxyphene N-Acetaminophen Other (See Comments)    Sharp pains- headache    Patient Measurements: Height: 6' (182.9 cm) Weight: 208 lb 3.2 oz (94.439 kg) IBW/kg (Calculated) : 77.6  Vital Signs: Temp: 98 F (36.7 C) (02/04 1204) Temp Source: Oral (02/04 1204) BP: 153/54 mmHg (02/04 1204) Pulse Rate: 61 (02/04 1204)  Labs:  Recent Labs  09/18/15 1604 09/18/15 1920 09/19/15 0359 09/19/15 0934 09/19/15 1041 09/20/15 0345 09/20/15 1308  HGB 12.4* 12.6*  --   --   --  11.9*  --   HCT 36.4* 36.8*  --   --   --  35.6*  --   PLT 252 256  --   --   --  253  --   LABPROT  --   --   --   --  14.6  --   --   INR  --   --   --   --  1.12  --   --   HEPARINUNFRC  --   --   --   --   --  0.21* 0.50  CREATININE 0.76 0.74  --   --   --   --   --   TROPONINI  --  0.03 0.03 <0.03  --   --   --    Estimated Creatinine Clearance: 103.9 mL/min (by C-G formula based on Cr of 0.74).  Assessment: 70 yo male with chest pain s/p cath started on heparin.  HL therapeutic at 0.50, Hgb 11.9, Plts 253, no s/sx bleeding noted.   Goal of Therapy:  Heparin level 0.3-0.7 units/ml Monitor platelets by anticoagulation protocol: Yes   Plan:  Continue Heparin 1350 units/hr Daily CBC/HL Monitor for s/sx of bleeding  Bennye Alm, PharmD Pharmacy Resident [phone] 09/20/2015,2:44 PM

## 2015-09-20 NOTE — Plan of Care (Signed)
Problem: Phase I Progression Outcomes Goal: Hemodynamically stable Outcome: Progressing Assess VS Routine. Continuous Cardiac Monitoring.  Goal: Anginal pain relieved Outcome: Progressing Assess for Pain q shift with vitals and PRN Goal: Aspirin unless contraindicated Outcome: Progressing ASA given

## 2015-09-20 NOTE — Progress Notes (Signed)
Pt. BP was reading low (90/54), so I lowered the drip rate of the nitroglycerin from 10 mcg/min to 5 mcg/min. His BP continued to read low, so I switched the cuff from his right leg to his right arm. After waiting 15 minutes, his BP was 74/56, so I put the BP cuff on his left arm and the reading was 130/59. Pt BP will be taken on the left arm for the remainder of the shift due to the difference in readings.

## 2015-09-20 NOTE — Progress Notes (Signed)
ANTICOAGULATION CONSULT NOTE - Follow Up Consult  Pharmacy Consult for Heparin  Indication: S/P cath awaiting CABG consult  Allergies  Allergen Reactions  . Ivp Dye [Iodinated Diagnostic Agents]     Blood Pressure dropped----pt was pre-medicated with 13 hour prep and did fine with pre-meds--amy 03/09/11   . Penicillins Nausea Only    REACTION: gi upset  . Sulfonamide Derivatives Hives and Itching  . Sulfa Antibiotics Hives  . Propoxyphene N-Acetaminophen Other (See Comments)    Sharp pains- headache    Patient Measurements: Height: 6' (182.9 cm) Weight: 209 lb 4.8 oz (94.938 kg) IBW/kg (Calculated) : 77.6  Vital Signs: Temp: 98.1 F (36.7 C) (02/03 2301) Temp Source: Oral (02/03 2301) BP: 138/54 mmHg (02/04 0400) Pulse Rate: 76 (02/04 0400)  Labs:  Recent Labs  09/18/15 1604 09/18/15 1920 09/19/15 0359 09/19/15 0934 09/19/15 1041 09/20/15 0345  HGB 12.4* 12.6*  --   --   --  11.9*  HCT 36.4* 36.8*  --   --   --  35.6*  PLT 252 256  --   --   --  253  LABPROT  --   --   --   --  14.6  --   INR  --   --   --   --  1.12  --   HEPARINUNFRC  --   --   --   --   --  0.21*  CREATININE 0.76 0.74  --   --   --   --   TROPONINI  --  0.03 0.03 <0.03  --   --    Estimated Creatinine Clearance: 104.2 mL/min (by C-G formula based on Cr of 0.74).  Assessment: Initial heparin level s/p cath is 0.21, no issues per RN  Goal of Therapy:  Heparin level 0.3-0.7 units/ml Monitor platelets by anticoagulation protocol: Yes   Plan:  -Increase heparin to 1350 units/hr -1300 HL -F/U plans for CABG  Narda Bonds 09/20/2015,4:54 AM

## 2015-09-20 NOTE — Plan of Care (Signed)
Problem: Nutrition: Goal: Adequate nutrition will be maintained Outcome: Progressing Assess Nutritional Intake.

## 2015-09-20 NOTE — Progress Notes (Signed)
[facility] PROGRESS NOTE  [name] [id number] DOB: 1946/06/22 DOA: 09/18/2015 PCP:  [name], MD  Assessment/Plan: Unstable angina: cath showed:  Dominance: Right. Mild aortic root calcification and diffuse coronary calcification is evident.  Left Main   . Ost LM lesion, 60% stenosed. The lesion is type C Calcified.  . LM lesion, 80% stenosed. The lesion is type C Diffuse. Cauliflower-like lesion in the distal LM    Left Anterior Descending   . Prox LAD to Mid LAD lesion, 95% stenosed. The lesion is type C Calcified. Cauliflower-like calcific stenosis.    Ramus Intermedius Vessel is small.    Left Circumflex   . Ost Cx lesion, 90% stenosed. The lesion is type C Calcified.  . Prox Cx to Mid Cx lesion, 95% stenosed. The lesion is type C Calcified.    Right Coronary Artery   . Prox RCA lesion, 70% stenosed. Calcified.  . Prox RCA to Mid RCA lesion, 70% stenosed. The lesion is type C Calcified. 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 Large PDA and PL branches.   LV gram: Normal LV systolic function, no significant mitral regurgitation. Mitral valve annulus normal.  Left subclavian artery/LIMA: Widely patent. Right subclavian artery shows a midsegment large calcific cauliflower-like [place] stenosis of 70-80% or more. There was a 60 mm pressure difference between the right and left arm. However the right subclavian artery itself was patent with antegrade injection. 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  Await word for TCTS regarding CABG. 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 No dyspnea  Objective: Filed Vitals:   09/20/15 0500 09/20/15 0854  BP: 120/51 119/50  Pulse: 70 58  Temp: 97.9 F (36.6 C) 98 F (36.7 C)  Resp: 14 16    Intake/Output Summary (Last 24 hours) at 09/20/15 1016 Last data filed at 09/19/15 2145  Gross per 24 hour  Intake  287.7 ml  Output   2805 ml  Net -2517.3 ml   Filed Weights   09/18/15 1854 09/19/15 0436 09/20/15 0500  Weight: 95.301 kg (210 lb 1.6 oz) 94.938 kg (209 lb 4.8 oz) 94.439 kg (208 lb 3.2 oz)    Exam:   General:  Asleep. Arousable. Cooperative and appropriate  Cardiovascular: RRR without MGR  Respiratory: CTA without WRR  Abdomen: S, NT, ND  Ext: no CCE  Basic Metabolic Panel:  Recent Labs Lab 09/18/15 1604 09/18/15 1920  NA 131*  --   K 4.7  --   CL 94*  --   CO2 28  --   GLUCOSE 163*  --   BUN 7  --   CREATININE 0.76 0.74  CALCIUM 9.4  --    Liver Function Tests: No results for input(s): AST, ALT, ALKPHOS, BILITOT, PROT, ALBUMIN in the last 168 hours. No results for input(s): LIPASE, AMYLASE in the last 168 hours. No results for input(s): AMMONIA in the last 168 hours. CBC:  Recent Labs Lab 09/18/15 1604 09/18/15 1920 09/20/15 0345  WBC 4.3 4.5 4.8  HGB 12.4* 12.6* 11.9*  HCT 36.4* 36.8* 35.6*  MCV 86.1 85.0 86.2  PLT 252 256 253   Cardiac Enzymes:  Recent Labs Lab 09/18/15 1920 09/19/15 0359 09/19/15 0934  TROPONINI 0.03 0.03 <0.03   BNP (last 3 results) No results for input(s): BNP in the last 8760 hours.  ProBNP (last 3 results) No results for input(s): PROBNP in the last 8760 hours.  CBG:  Recent Labs Lab 09/19/15 0723 09/19/15 1137 09/19/15 1844 09/19/15 2113 09/20/15 0730  GLUCAP 95 113* 152* 309* 138*    No results found for this or any previous visit (from the past 240 hour(s)).   Studies: No results found.  Scheduled  Meds: . aspirin EC  325 mg Oral Daily  . atorvastatin  80 mg Oral Q0600  . buPROPion  300 mg Oral BH-q7a  . cholecalciferol  5,000 Units Oral Daily  . clonazePAM  1 mg Oral TID  . feeding supplement (ENSURE ENLIVE)  237 mL Oral BID BM  . [START ON 09/21/2015] fentaNYL  100 mcg Transdermal Q72H  . insulin aspart  0-15 Units Subcutaneous TID WC  . insulin aspart  0-5 Units Subcutaneous QHS  . insulin aspart  4 Units Subcutaneous TID WC  . insulin detemir  5 Units Subcutaneous QHS  . losartan  25 mg Oral Daily  . pantoprazole  40 mg Oral Daily  . sodium chloride flush  3 mL Intravenous Q12H  . tamsulosin  0.4 mg Oral BID  . zonisamide  150 mg Oral QHS   Continuous Infusions: . heparin 1,350 Units/hr (09/20/15 0502)  . nitroGLYCERIN 2.5 mcg/min (09/20/15 0823)    Time spent: 15 minutes  [facility] [email or web address], password [email or web address] 09/20/2015, 10:16 AM

## 2015-09-20 NOTE — Progress Notes (Addendum)
Pre-op Cardiac Surgery  Carotid Findings:  Less than 39% ICA stenosis. Right subclavian steal. Right vertebral artery flow is retrograde.  Left vertebral artery flow is antegrade.   Upper Extremity Right Left  Brachial Pressures 108B 153T  Radial Waveforms B T  Ulnar Waveforms B T  Palmar Arch (Allen's Test) Doppler signal remains normal with radial compression and obliterates with ulnar compression WNL   Findings:      Lower  Extremity Right Left  Dorsalis Pedis 168B 138B  Anterior Tibial    Posterior Tibial 183B 143B  Ankle/Brachial Indices >1.0 0.93    Findings:  WNL

## 2015-09-20 NOTE — Progress Notes (Signed)
VASCULAR LAB PRELIMINARY  PRELIMINARY  PRELIMINARY  PRELIMINARY   Lower Extremity Vein Map    Right Great Saphenous Vein   Segment Diameter Comment  1. Origin 5.18mm   2. High Thigh 4.41mm   3. Mid Thigh 4.81mm   4. Low Thigh 4.15mm   5. At Knee 4.36mm   6. High Calf 3.61mm   7. Low Calf 4.17mm Multiple branches  8. Ankle mm Multiple branches   mm    mm    mm     Left Small Saphenous Vein  Segment Diameter Comment  1. Origin 3.21mm   2. High Calf 3.7mm   3. Low Calf 2.51mm Multiple branches  4. Ankle 2.61mm Multiple branches   mm    mm    mm     Lorenia Hoston, RVT 09/20/2015, 4:13 PM

## 2015-09-21 ENCOUNTER — Inpatient Hospital Stay (HOSPITAL_COMMUNITY): Payer: Medicare Other

## 2015-09-21 DIAGNOSIS — I771 Stricture of artery: Secondary | ICD-10-CM

## 2015-09-21 LAB — BLOOD GAS, ARTERIAL
Acid-base deficit: 0.6 mmol/L (ref 0.0–2.0)
Bicarbonate: 23.5 mEq/L (ref 20.0–24.0)
Drawn by: 331761
FIO2: 0.21
O2 Saturation: 96 %
Patient temperature: 98.5
TCO2: 24.7 mmol/L (ref 0–100)
pCO2 arterial: 38.7 mmHg (ref 35.0–45.0)
pH, Arterial: 7.4 (ref 7.350–7.450)
pO2, Arterial: 85.5 mmHg (ref 80.0–100.0)

## 2015-09-21 LAB — TYPE AND SCREEN
ABO/RH(D): A NEG
Antibody Screen: NEGATIVE

## 2015-09-21 LAB — COMPREHENSIVE METABOLIC PANEL
ALT: 23 U/L (ref 17–63)
AST: 29 U/L (ref 15–41)
Albumin: 3.4 g/dL — ABNORMAL LOW (ref 3.5–5.0)
Alkaline Phosphatase: 64 U/L (ref 38–126)
Anion gap: 6 (ref 5–15)
BUN: 11 mg/dL (ref 6–20)
CO2: 29 mmol/L (ref 22–32)
Calcium: 8.8 mg/dL — ABNORMAL LOW (ref 8.9–10.3)
Chloride: 100 mmol/L — ABNORMAL LOW (ref 101–111)
Creatinine, Ser: 0.74 mg/dL (ref 0.61–1.24)
GFR calc Af Amer: >60 mL/min (ref >60)
GFR calc non Af Amer: >60 mL/min (ref >60)
Glucose, Bld: 120 mg/dL — ABNORMAL HIGH (ref 65–99)
Potassium: 4.2 mmol/L (ref 3.5–5.1)
Sodium: 135 mmol/L (ref 135–145)
Total Bilirubin: 0.5 mg/dL (ref 0.3–1.2)
Total Protein: 6.8 g/dL (ref 6.5–8.1)

## 2015-09-21 LAB — URINALYSIS, ROUTINE W REFLEX MICROSCOPIC
Bilirubin Urine: NEGATIVE
Glucose, UA: NEGATIVE mg/dL
Hgb urine dipstick: NEGATIVE
Ketones, ur: NEGATIVE mg/dL
Leukocytes, UA: NEGATIVE
Nitrite: NEGATIVE
Protein, ur: NEGATIVE mg/dL
Specific Gravity, Urine: 1.01 (ref 1.005–1.030)
pH: 7.5 (ref 5.0–8.0)

## 2015-09-21 LAB — CBC
HEMATOCRIT: 33.8 % — AB (ref 39.0–52.0)
Hemoglobin: 11.3 g/dL — ABNORMAL LOW (ref 13.0–17.0)
MCH: 28.8 pg (ref 26.0–34.0)
MCHC: 33.4 g/dL (ref 30.0–36.0)
MCV: 86.2 fL (ref 78.0–100.0)
PLATELETS: 244 10*3/uL (ref 150–400)
RBC: 3.92 MIL/uL — ABNORMAL LOW (ref 4.22–5.81)
RDW: 14 % (ref 11.5–15.5)
WBC: 5.6 10*3/uL (ref 4.0–10.5)

## 2015-09-21 LAB — PROTIME-INR
INR: 1.2 (ref 0.00–1.49)
Prothrombin Time: 15.3 seconds — ABNORMAL HIGH (ref 11.6–15.2)

## 2015-09-21 LAB — HEPARIN LEVEL (UNFRACTIONATED): Heparin Unfractionated: 0.6 IU/mL (ref 0.30–0.70)

## 2015-09-21 LAB — SURGICAL PCR SCREEN
MRSA, PCR: NEGATIVE
Staphylococcus aureus: POSITIVE — AB

## 2015-09-21 LAB — GLUCOSE, CAPILLARY
Glucose-Capillary: 121 mg/dL — ABNORMAL HIGH (ref 65–99)
Glucose-Capillary: 109 mg/dL — ABNORMAL HIGH (ref 65–99)
Glucose-Capillary: 141 mg/dL — ABNORMAL HIGH (ref 65–99)
Glucose-Capillary: 146 mg/dL — ABNORMAL HIGH (ref 65–99)

## 2015-09-21 MED ORDER — METOPROLOL TARTRATE 12.5 MG HALF TABLET
12.5000 mg | ORAL_TABLET | Freq: Once | ORAL | Status: AC
  Administered 2015-09-22: 12.5 mg via ORAL
  Filled 2015-09-21: qty 1

## 2015-09-21 MED ORDER — CHLORHEXIDINE GLUCONATE 4 % EX LIQD
CUTANEOUS | Status: AC
  Administered 2015-09-21: 23:00:00
  Filled 2015-09-21: qty 15

## 2015-09-21 MED ORDER — TEMAZEPAM 15 MG PO CAPS
15.0000 mg | ORAL_CAPSULE | Freq: Once | ORAL | Status: AC | PRN
  Administered 2015-09-21: 15 mg via ORAL
  Filled 2015-09-21: qty 1

## 2015-09-21 MED ORDER — POTASSIUM CHLORIDE 2 MEQ/ML IV SOLN
80.0000 meq | INTRAVENOUS | Status: DC
  Filled 2015-09-21: qty 40

## 2015-09-21 MED ORDER — BISACODYL 5 MG PO TBEC
5.0000 mg | DELAYED_RELEASE_TABLET | Freq: Once | ORAL | Status: AC
  Administered 2015-09-21: 5 mg via ORAL
  Filled 2015-09-21: qty 1

## 2015-09-21 MED ORDER — CHLORHEXIDINE GLUCONATE CLOTH 2 % EX PADS
6.0000 | MEDICATED_PAD | Freq: Once | CUTANEOUS | Status: AC
  Administered 2015-09-21: 6 via TOPICAL

## 2015-09-21 MED ORDER — DOPAMINE-DEXTROSE 3.2-5 MG/ML-% IV SOLN
0.0000 ug/kg/min | INTRAVENOUS | Status: DC
  Filled 2015-09-21: qty 250

## 2015-09-21 MED ORDER — DEXTROSE 5 % IV SOLN
1.5000 g | INTRAVENOUS | Status: DC
  Administered 2015-09-22: 1.5 g via INTRAVENOUS
  Administered 2015-09-22: .75 g via INTRAVENOUS
  Filled 2015-09-21: qty 1.5

## 2015-09-21 MED ORDER — MAGNESIUM SULFATE 50 % IJ SOLN
40.0000 meq | INTRAMUSCULAR | Status: DC
  Filled 2015-09-21: qty 10

## 2015-09-21 MED ORDER — NITROGLYCERIN IN D5W 200-5 MCG/ML-% IV SOLN
2.0000 ug/min | INTRAVENOUS | Status: DC
  Filled 2015-09-21: qty 250

## 2015-09-21 MED ORDER — DEXMEDETOMIDINE HCL IN NACL 400 MCG/100ML IV SOLN
0.1000 ug/kg/h | INTRAVENOUS | Status: DC
  Administered 2015-09-22: .2 ug/kg/h via INTRAVENOUS
  Filled 2015-09-21: qty 100

## 2015-09-21 MED ORDER — AMINOCAPROIC ACID 250 MG/ML IV SOLN
INTRAVENOUS | Status: DC
  Administered 2015-09-22: 69.8 mL/h via INTRAVENOUS
  Filled 2015-09-21: qty 40

## 2015-09-21 MED ORDER — EPINEPHRINE HCL 1 MG/ML IJ SOLN
0.0000 ug/min | INTRAVENOUS | Status: DC
  Filled 2015-09-21: qty 4

## 2015-09-21 MED ORDER — CHLORHEXIDINE GLUCONATE 0.12 % MT SOLN
15.0000 mL | Freq: Once | OROMUCOSAL | Status: AC
  Administered 2015-09-22: 15 mL via OROMUCOSAL
  Filled 2015-09-21: qty 15

## 2015-09-21 MED ORDER — PHENYLEPHRINE HCL 10 MG/ML IJ SOLN
30.0000 ug/min | INTRAVENOUS | Status: DC
  Filled 2015-09-21: qty 2

## 2015-09-21 MED ORDER — DEXTROSE 5 % IV SOLN
750.0000 mg | INTRAVENOUS | Status: DC
  Filled 2015-09-21: qty 750

## 2015-09-21 MED ORDER — VANCOMYCIN HCL 10 G IV SOLR
1500.0000 mg | INTRAVENOUS | Status: DC
  Administered 2015-09-22: 1500 mg via INTRAVENOUS
  Filled 2015-09-21: qty 1500

## 2015-09-21 MED ORDER — SODIUM CHLORIDE 0.9 % IV SOLN
INTRAVENOUS | Status: DC
  Administered 2015-09-22: 2.1 [IU]/h via INTRAVENOUS
  Filled 2015-09-21: qty 2.5

## 2015-09-21 MED ORDER — HEPARIN SODIUM (PORCINE) 1000 UNIT/ML IJ SOLN
INTRAMUSCULAR | Status: DC
  Filled 2015-09-21: qty 30

## 2015-09-21 MED ORDER — PLASMA-LYTE 148 IV SOLN
INTRAVENOUS | Status: AC
  Administered 2015-09-22: 500 mL
  Filled 2015-09-21: qty 2.5

## 2015-09-21 NOTE — Progress Notes (Signed)
[facility] PROGRESS NOTE  Montero Monce [id number] DOB: Jan 24, 1946 DOA: 09/18/2015 PCP:  [name], MD  Assessment/Plan: Unstable angina: cath showed:  Dominance: Right. Mild aortic root calcification and diffuse coronary calcification is evident.  Left Main   . Ost LM lesion, 60% stenosed. The lesion is type C Calcified.  . LM lesion, 80% stenosed. The lesion is type C Diffuse. Cauliflower-like lesion in the distal LM    Left Anterior Descending   . Prox LAD to Mid LAD lesion, 95% stenosed. The lesion is type C Calcified. Cauliflower-like calcific stenosis.    Ramus Intermedius Vessel is small.    Left Circumflex   . Ost Cx lesion, 90% stenosed. The lesion is type C Calcified.  . Prox Cx to Mid Cx lesion, 95% stenosed. The lesion is type C Calcified.    Right Coronary Artery   . Prox RCA lesion, 70% stenosed. Calcified.  . Prox RCA to Mid RCA lesion, 70% stenosed. The lesion is type C Calcified. 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 Large PDA and PL branches.   LV gram: Normal LV systolic function, no significant mitral regurgitation. Mitral valve annulus normal.  Left subclavian artery/LIMA: Widely patent. Right subclavian artery shows a midsegment large calcific cauliflower-like [place] stenosis of 70-80% or more. There was a 60 mm pressure difference between the right and left arm. However the right subclavian artery itself was patent with antegrade injection. 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  For CABG?  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   Objective: Filed Vitals:   09/20/15 1951 09/21/15 0430  BP: 143/61 157/58  Pulse: 57 57  Temp: 98.2 F (36.8 C) 98.1 F (36.7 C)  Resp: 18 16    Intake/Output Summary (Last 24 hours) at 09/21/15 1038 Last data filed at 09/21/15 0852  Gross per 24 hour  Intake 1122.51 ml  Output   5025 ml  Net -3902.49 ml   Filed Weights   09/19/15 0436 09/20/15 0500 09/21/15 0430  Weight: 94.938 kg (209 lb 4.8 oz) 94.439 kg (208 lb 3.2 oz) 93.169 kg (205 lb 6.4 oz)    Exam:   General:  Asleep. Arousable. Cooperative and appropriate  Cardiovascular: RRR without MGR  Respiratory: CTA without WRR  Abdomen: S, NT, ND  Ext: no CCE  Basic Metabolic Panel:  Recent Labs Lab 09/18/15 1604 09/18/15 1920 09/21/15 0404  NA 131*  --  135  K 4.7  --  4.2  CL 94*  --  100*  CO2 28  --  29  GLUCOSE 163*  --  120*  BUN 7  --  11  CREATININE 0.76 0.74 0.74  CALCIUM 9.4  --  8.8*   Liver Function Tests:  Recent Labs Lab 09/21/15 0404  AST 29  ALT 23  ALKPHOS 64  BILITOT 0.5  PROT 6.8  ALBUMIN 3.4*   No results for input(s): LIPASE, AMYLASE in the last 168 hours. No results for input(s): AMMONIA in the last 168 hours. CBC:  Recent Labs Lab 09/18/15 1604 09/18/15 1920 09/20/15 0345 09/21/15 0404  WBC 4.3 4.5  4.8 5.6  HGB 12.4* 12.6* 11.9* 11.3*  HCT 36.4* 36.8* 35.6* 33.8*  MCV 86.1 85.0 86.2 86.2  PLT 252 256 253 244   Cardiac Enzymes:  Recent Labs Lab 09/18/15 1920 09/19/15 0359 09/19/15 0934  TROPONINI 0.03 0.03 <0.03   BNP (last 3 results) No results for input(s): BNP in the last 8760 hours.  ProBNP (last 3 results) No results for input(s): PROBNP in the last 8760 hours.  CBG:  Recent Labs Lab 09/20/15 0730 09/20/15 1058 09/20/15 1628 09/20/15 2113 09/21/15 0741  GLUCAP 138* 113* 146* 113* 121*    No results found for this or any previous visit (from the past 240 hour(s)).   Studies: No  results found.  Scheduled Meds: . aspirin EC  325 mg Oral Daily  . atorvastatin  80 mg Oral Q0600  . buPROPion  300 mg Oral BH-q7a  . cholecalciferol  5,000 Units Oral Daily  . clonazePAM  1 mg Oral TID  . feeding supplement (ENSURE ENLIVE)  237 mL Oral BID BM  . fentaNYL  100 mcg Transdermal Q72H  . insulin aspart  0-15 Units Subcutaneous TID WC  . insulin aspart  0-5 Units Subcutaneous QHS  . insulin aspart  4 Units Subcutaneous TID WC  . insulin detemir  5 Units Subcutaneous QHS  . losartan  25 mg Oral Daily  . pantoprazole  40 mg Oral Daily  . polyethylene glycol  17 g Oral Daily  . sodium chloride flush  3 mL Intravenous Q12H  . tamsulosin  0.4 mg Oral BID  . zonisamide  150 mg Oral QHS   Continuous Infusions: . heparin 1,350 Units/hr (09/20/15 1600)  . nitroGLYCERIN 5 mcg/min (09/21/15 0541)    Time spent: 15 minutes  [place] [facility] [email or web address], password [facility] 09/21/2015, 10:38 AM  LOS: 1 day

## 2015-09-21 NOTE — Progress Notes (Signed)
ANTICOAGULATION CONSULT NOTE - Follow Up Consult  Pharmacy Consult for Heparin  Indication: chest pain/ACS  Allergies  Allergen Reactions  . Ivp Dye [Iodinated Diagnostic Agents]     Blood Pressure dropped----pt was pre-medicated with 13 hour prep and did fine with pre-meds--amy 03/09/11   . Penicillins Nausea Only    REACTION: gi upset  . Sulfonamide Derivatives Hives and Itching  . Sulfa Antibiotics Hives  . Propoxyphene N-Acetaminophen Other (See Comments)    Sharp pains- headache    Patient Measurements: Height: 6' (182.9 cm) Weight: 205 lb 6.4 oz (93.169 kg) IBW/kg (Calculated) : 77.6  Vital Signs: Temp: 98.1 F (36.7 C) (02/05 0430) Temp Source: Oral (02/05 0430) BP: 157/58 mmHg (02/05 0430) Pulse Rate: 57 (02/05 0430)  Labs:  Recent Labs  09/18/15 1604 09/18/15 1920 09/19/15 0359 09/19/15 0934 09/19/15 1041 09/20/15 0345 09/20/15 1308 09/21/15 0404  HGB 12.4* 12.6*  --   --   --  11.9*  --  11.3*  HCT 36.4* 36.8*  --   --   --  35.6*  --  33.8*  PLT 252 256  --   --   --  253  --  244  LABPROT  --   --   --   --  14.6  --   --  15.3*  INR  --   --   --   --  1.12  --   --  1.20  HEPARINUNFRC  --   --   --   --   --  0.21* 0.50 0.60  CREATININE 0.76 0.74  --   --   --   --   --  0.74  TROPONINI  --  0.03 0.03 <0.03  --   --   --   --    Estimated Creatinine Clearance: 103.3 mL/min (by C-G formula based on Cr of 0.74).  Assessment: 70 yo male with chest pain s/p cath started on heparin.  HL therapeutic at 0.60, Hgb 11.3, Plts 244, no s/sx bleeding noted.    Goal of Therapy:  Heparin level 0.3-0.7 units/ml Monitor platelets by anticoagulation protocol: Yes   Plan:  Continue Heparin 1350 units/hr Daily CBC/HL Monitor for s/sx of bleeding  Bennye Alm, PharmD Pharmacy Resident [phone] 09/21/2015,10:10 AM

## 2015-09-21 NOTE — Progress Notes (Signed)
Pt educated on procedure today, pt has declined to watch video. Rn has educated pt on IS and CABG prep for tonight and in the AM. PFTS will be done by respiratory at 5am. All procedure consents are in pt paper chart. UA still needs to be collected. Will pass on to next shift. Pt is resting, with no further questions at this time

## 2015-09-21 NOTE — Progress Notes (Addendum)
Patient ID: [name], male   DOB: [birth year], 70 y.o.   MRN: [id number]      [address]       [place] [address]             [phone]                 2 Days Post-Op Procedure(s) (LRB): Left Heart Cath and Coronary Angiography (N/A)  LOS: 1 day   Subjective: Episode chest pain last night, none today. No history of tia or stroke in the past, he notes that for long time people have had trouble getting bp in left arm , patient is right handed   Objective: Vital signs in last 24 hours: Patient Vitals for the past 24 hrs:  BP Temp Temp src Pulse Resp SpO2 Weight  09/21/15 0430 (!) 157/58 mmHg 98.1 F (36.7 C) Oral (!) 57 16 94 % 205 lb 6.4 oz (93.169 kg)  09/20/15 1951 (!) 143/61 mmHg 98.2 F (36.8 C) Oral (!) 57 18 98 % -  09/20/15 1608 105/65 mmHg 98.2 F (36.8 C) Oral 61 16 96 % -    Filed Weights   09/19/15 0436 09/20/15 0500 09/21/15 0430  Weight: 209 lb 4.8 oz (94.938 kg) 208 lb 3.2 oz (94.439 kg) 205 lb 6.4 oz (93.169 kg)    Hemodynamic parameters for last 24 hours:    Intake/Output from previous day: 02/04 0701 - 02/05 0700 In: 642.5 [P.O.:360; I.V.:282.5] Out: 5025 [Urine:5025] Intake/Output this shift: Total I/O In: 720 [P.O.:720] Out: 400 [Urine:400]  Scheduled Meds: . aspirin EC  325 mg Oral Daily  . atorvastatin  80 mg Oral Q0600  . buPROPion  300 mg Oral BH-q7a  . cholecalciferol  5,000 Units Oral Daily  . clonazePAM  1 mg Oral TID  . feeding supplement (ENSURE ENLIVE)  237 mL Oral BID BM  . fentaNYL  100 mcg Transdermal Q72H  . insulin aspart  0-15 Units Subcutaneous TID WC  . insulin aspart  0-5 Units Subcutaneous QHS  . insulin aspart  4 Units Subcutaneous TID WC  . insulin detemir  5 Units Subcutaneous QHS  . losartan  25 mg Oral Daily  . pantoprazole  40 mg Oral Daily  . polyethylene glycol  17 g Oral Daily  . sodium chloride flush  3 mL Intravenous Q12H  . tamsulosin  0.4 mg Oral BID  . zonisamide  150 mg Oral QHS    Continuous Infusions: . heparin 1,350 Units/hr (09/20/15 1600)  . nitroGLYCERIN 5 mcg/min (09/21/15 0541)   PRN Meds:.sodium chloride, acetaminophen, albuterol, cyclobenzaprine, gi cocktail, morphine injection, ondansetron (ZOFRAN) IV, oxyCODONE, sodium chloride flush  General appearance: alert and cooperative Neurologic: intact Heart: regular rate and rhythm, S1, S2 normal, no murmur, click, rub or gallop Lungs: clear to auscultation bilaterally Abdomen: soft, non-tender; bowel sounds normal; no masses,  no organomegaly Extremities: extremities normal, atraumatic, no cyanosis or edema and Homans sign is negative, no sign of DVT  vein mapping done this am and carotid duplex  Lab Results: CBC: Recent Labs  09/20/15 0345 09/21/15 0404  WBC 4.8 5.6  HGB 11.9* 11.3*  HCT 35.6* 33.8*  PLT 253 244   BMET:  Recent Labs  09/18/15 1604 09/18/15 1920 09/21/15 0404  NA 131*  --  135  K 4.7  --  4.2  CL 94*  --  100*  CO2 28  --  29  GLUCOSE 163*  --  120*  BUN 7  --  11  CREATININE 0.76 0.74 0.74  CALCIUM 9.4  --  8.8*    PT/INR:  Recent Labs  09/21/15 0404  LABPROT 15.3*  INR 1.20     Radiology No results found.   Assessment/Plan: S/P Procedure(s) (LRB): Left Heart Cath and Coronary Angiography (N/A) History of previous left CEA.   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 Patient is right handed, f left palmer arch normal . I have discussed with patient risks and options of CABG, poss left radial harvest. Vascular surgery is reviewing films , poss right subclavian bypass or endarterectomy.  Renal function stable after cath.  The goals risks and alternatives of the planned surgical procedure CABG poss left radial harvest, possible right subclavian bypass  have been discussed with the patient in detail.  The risks of the procedure including death, infection, stroke, myocardial infarction, bleeding, blood transfusion have all been discussed specifically.  I have quoted [name] a 5% of perioperative mortality and a complication rate as high as 40%. The patient's questions have been answered.[name] is willing  to proceed with the planned procedure.  In addition to other potential risks and complications from the surgery, I have made the patient aware of the recent [place] concerning the risk of infection by Myocobacterium chimaera related to the use of [employer] 3T heater-cooler equipment during cardiac surgery. I discussed with the patient the low risk of infection, as well as our compliance with the most current FDA recommendations to minimize infection and testing of all devices for contamination. The patient has been made aware of the limited alternatives to immediately replacing the current equipment. The patient has been informed regarding the risks associated with waiting to proceed with needed surgery and that such risks are greater than the risk of infection related to the use of the heater-cooler device. I did make the patient aware that after careful review of the patients having cardiac surgery at [facility] we have no evidence that heater/cooler related infections have occurred at [facility]. We discussed that this is a slow-growing bacterium, such that it can take some period of time for symptoms to develop.   [name] MD 09/21/2015 12:55 PM

## 2015-09-21 NOTE — Consult Note (Signed)
[facility] [place] HISTORY AND PHYSICAL   Referring Physician: Ceasar Mons History of Present Illness:  Patient is a 70 y.o. male who presents for evaluation of right innominate artery high grade stenosis.  Pt denies symptoms or TIA amaurosis or stroke recently.  He may have some vague symptoms of right arm fatigue.  He states that have not been able to easily get a blood pressure in his right arm for years.  He was noted to have a 60 mm gradient between arms this admission.  He has had prior left side embolus to eye causing blindness and has previously had left CEA by Dr Trula Slade 2012.  Review CTA of neck from 2012 shows the left CCA coming off the very proximal innominate and calcified exophytic plaque within the innominate proximal to the right CCA and right SCA origins.  Angio by Dr Nadyne Coombes on Friday shows 90% stenosis of mid right innominate by heavily calcified exophytic plaque.  Left subclavian was widely patent as well as left vert.  Duplex showed no significant carotid bifurcation disease bilaterally with reversal of flow right vert. He is scheduled for CABG for left main disease by Dr Servando Snare tomorrow.  Of note, pt has had prior ablation of bilateral greater saphenous by Dr Kellie Simmering in the past.  He has also had aortobifem by Dr Trula Slade in 2012. Other medical problems include hypertension, DM, hyperlipidemia all of which are controlled. He is on aspirin.   Past Medical History  Diagnosis Date  . DJD (degenerative joint disease)     L wrist  . Lumbar disc disease   . HTN (hypertension)   . Anemia     NOS  . Osteoporosis     tx per [facility], Dr Prudencio Burly, thought due to heavy steriod use after 1978  . Spine fracture     hx, multiple  . Blindness of left eye     near blindness. 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 GERD (gastroesophageal reflux disease)   . DM2 (diabetes mellitus, type 2) ([place])   . Anxiety   . Depression   . BPH (benign prostatic hypertrophy)   . HLD (hyperlipidemia)   .  Chronic pain syndrome   . Chronic hyponatremia   . Diabetes mellitus   . Broken foot Oct. 12, 2013    Right foot Fx  . Varicose veins     Past Surgical History  Procedure Laterality Date  . Right hand fracture  1969  . Left ankle ganglion cyst  1976  . Lumbar disc surgery  1981, 2004  . Nasal sinus surgery      multiple- x8. last 1997 with obliteration  . Left cataract  1996    right - 1997  . Left foot surgery  1998    R surgery/fracture - 1999  . Left cts  2006    R CTS - 2007  . Left wrist/hand fusion  2008  . L foot open repair jones fracture  2010     5th metetarsal   . Rotator cuff repair  2006    R, than repeat 2011, Dr Theda Sers  . Vocal surgery  1996  . Repair thoracic aorta    . Trigger finger release Left 11/11/2014    Procedure: LEFT LONG FINGER RELEASE TRIGGER FINGER/A-1 PULLEY;  Surgeon: Milly Jakob, MD;  Location: [facility];  Service: Orthopedics;  Laterality: Left;  Marland Kitchen Eye surgery      Social History Social History  Substance Use Topics  .  Smoking status: Former Smoker -- 2.00 packs/day    Types: Cigarettes    Quit date: 11/06/1988  . Smokeless tobacco: Never Used  . Alcohol Use: No    Family History Family History  Problem Relation Age of Onset  . Lung cancer Father     and grandmother  . Throat cancer      family hx - "bad living" - also lung CA, heart disease and renal failure    . Diabetes Mother     DM, also sister and MGF     Allergies  Allergies  Allergen Reactions  . Ivp Dye [Iodinated Diagnostic Agents]     Blood Pressure dropped----pt was pre-medicated with 13 hour prep and did fine with pre-meds--amy 03/09/11   . Penicillins Nausea Only    REACTION: gi upset  . Sulfonamide Derivatives Hives and Itching  . Sulfa Antibiotics Hives  . Propoxyphene N-Acetaminophen Other (See Comments)    Sharp pains- headache     Current Facility-Administered Medications  Medication Dose Route Frequency Provider Last Rate Last  Dose  . 0.9 %  sodium chloride infusion  250 mL Intravenous PRN Adrian Prows, MD      . acetaminophen (TYLENOL) tablet 650 mg  650 mg Oral Q4H PRN Charlynne Cousins, MD   650 mg at 09/20/15 [id number]  . albuterol (PROVENTIL) (2.5 MG/3ML) 0.083% nebulizer solution 2.5 mg  2.5 mg Nebulization Q4H PRN Charlynne Cousins, MD      . Derrill Memo ON 09/22/2015] aminocaproic acid (AMICAR) 10 g in sodium chloride 0.9 % 100 mL infusion   Intravenous To OR [name], MD      . aspirin EC tablet 325 mg  325 mg Oral Daily Charlynne Cousins, MD   325 mg at 09/21/15 0919  . atorvastatin (LIPITOR) tablet 80 mg  80 mg Oral Q0600 Adrian Prows, MD   80 mg at 09/21/15 0555  . bisacodyl (DULCOLAX) EC tablet 5 mg  5 mg Oral Once [name], MD      . buPROPion (WELLBUTRIN XL) 24 hr tablet 300 mg  300 mg Oral BH-q7a Charlynne Cousins, MD   300 mg at 09/21/15 0555  . [START ON 09/22/2015] cefUROXime (ZINACEF) 1.5 g in dextrose 5 % 50 mL IVPB  1.5 g Intravenous To SS-Surg Delfina Redwood, MD      . Derrill Memo ON 09/22/2015] cefUROXime (ZINACEF) 750 mg in dextrose 5 % 50 mL IVPB  750 mg Intravenous To OR [name], MD      . Derrill Memo ON 09/22/2015] chlorhexidine (PERIDEX) 0.12 % solution 15 mL  15 mL Mouth/Throat Once [name], MD      . Chlorhexidine Gluconate Cloth 2 % PADS 6 each  6 each Topical Once Delfina Redwood, MD       And  . Chlorhexidine Gluconate Cloth 2 % PADS 6 each  6 each Topical Once Delfina Redwood, MD      . cholecalciferol (VITAMIN D) tablet 5,000 Units  5,000 Units Oral Daily Charlynne Cousins, MD   5,000 Units at 09/21/15 0919  . clonazePAM (KLONOPIN) tablet 1 mg  1 mg Oral TID Charlynne Cousins, MD   1 mg at 09/21/15 0919  . cyclobenzaprine (FLEXERIL) tablet 10 mg  10 mg Oral TID PRN Charlynne Cousins, MD      . Derrill Memo ON 09/22/2015] dexmedetomidine (PRECEDEX) 400 MCG/100ML (4 mcg/mL) infusion  0.1-0.7 mcg/kg/hr Intravenous To OR [name], MD      . [  START ON 09/22/2015]  DOPamine (INTROPIN) 800 mg in dextrose 5 % 250 mL (3.2 mg/mL) infusion  0-10 mcg/kg/min Intravenous To OR [name], MD      . Derrill Memo ON 09/22/2015] EPINEPHrine (ADRENALIN) 4 mg in dextrose 5 % 250 mL (0.016 mg/mL) infusion  0-10 mcg/min Intravenous To OR [name], MD      . feeding supplement (ENSURE ENLIVE) (ENSURE ENLIVE) liquid 237 mL  237 mL Oral BID BM Delfina Redwood, MD   237 mL at 09/20/15 1141  . fentaNYL (DURAGESIC - dosed mcg/hr) 100 mcg  100 mcg Transdermal Q72H Charlynne Cousins, MD   100 mcg at 09/20/15 2323  . gi cocktail (Maalox,Lidocaine,Donnatal)  30 mL Oral QID PRN Charlynne Cousins, MD      . Derrill Memo ON 09/22/2015] heparin 2,500 Units, papaverine 30 mg in electrolyte-148 (PLASMALYTE-148) 500 mL irrigation   Irrigation To OR [name], MD      . Derrill Memo ON 09/22/2015] heparin 30,000 units/NS 1000 mL solution for CELLSAVER   Other To OR [name], MD      . heparin ADULT infusion 100 units/mL (25000 units/250 mL)  1,350 Units/hr Intravenous Continuous Erenest Blank, RPH 13.5 mL/hr at 09/20/15 1600 1,350 Units/hr at 09/20/15 1600  . insulin aspart (novoLOG) injection 0-15 Units  0-15 Units Subcutaneous TID WC Charlynne Cousins, MD   2 Units at 09/21/15 [id number]  . insulin aspart (novoLOG) injection 0-5 Units  0-5 Units Subcutaneous QHS Charlynne Cousins, MD   4 Units at 09/19/15 2204  . insulin aspart (novoLOG) injection 4 Units  4 Units Subcutaneous TID WC Charlynne Cousins, MD   4 Units at 09/21/15 1248  . insulin detemir (LEVEMIR) injection 5 Units  5 Units Subcutaneous QHS Charlynne Cousins, MD   5 Units at 09/20/15 2253  . [START ON 09/22/2015] insulin regular (NOVOLIN R,HUMULIN R) 250 Units in sodium chloride 0.9 % 250 mL (1 Units/mL) infusion   Intravenous To OR [name], MD      . losartan (COZAAR) tablet 25 mg  25 mg Oral Daily Charlynne Cousins, MD   25 mg at 09/21/15 [id number]  . [START ON 09/22/2015] magnesium sulfate (IV Push/IM)  injection 40 mEq  40 mEq Other To OR [name], MD      . Derrill Memo ON 09/22/2015] metoprolol tartrate (LOPRESSOR) tablet 12.5 mg  12.5 mg Oral Once [name], MD      . morphine 2 MG/ML injection 2 mg  2 mg Intravenous Q2H PRN Charlynne Cousins, MD   2 mg at 09/21/15 0550  . nitroGLYCERIN 50 mg in dextrose 5 % 250 mL (0.2 mg/mL) infusion  0-200 mcg/min Intravenous Continuous Adrian Prows, MD 1.5 mL/hr at 09/21/15 0541 5 mcg/min at 09/21/15 0541  . [START ON 09/22/2015] nitroGLYCERIN 50 mg in dextrose 5 % 250 mL (0.2 mg/mL) infusion  2-200 mcg/min Intravenous To OR [name], MD      . ondansetron [facility]) injection 4 mg  4 mg Intravenous Q6H PRN Charlynne Cousins, MD      . oxyCODONE (Oxy IR/ROXICODONE) immediate release tablet 15 mg  15 mg Oral Q4H PRN Charlynne Cousins, MD   15 mg at 09/21/15 1315  . pantoprazole (PROTONIX) EC tablet 40 mg  40 mg Oral Daily Charlynne Cousins, MD   40 mg at 09/21/15 0920  . [START ON 09/22/2015] phenylephrine (NEO-SYNEPHRINE) 20 mg in dextrose 5 %  250 mL (0.08 mg/mL) infusion  30-200 mcg/min Intravenous To OR [name], MD      . polyethylene glycol [facility] / GLYCOLAX) packet 17 g  17 g Oral Daily Dianne Dun, NP   17 g at 09/21/15 [id number]  . [START ON 09/22/2015] potassium chloride injection 80 mEq  80 mEq Other To OR [name], MD      . sodium chloride flush (NS) 0.9 % injection 3 mL  3 mL Intravenous Q12H Adrian Prows, MD   3 mL at 09/21/15 0924  . sodium chloride flush (NS) 0.9 % injection 3 mL  3 mL Intravenous PRN Adrian Prows, MD   3 mL at 09/21/15 0556  . tamsulosin (FLOMAX) capsule 0.4 mg  0.4 mg Oral BID Charlynne Cousins, MD   0.4 mg at 09/21/15 [id number]  . temazepam (RESTORIL) capsule 15 mg  15 mg Oral Once PRN [name], MD      . Derrill Memo ON 09/22/2015] vancomycin (VANCOCIN) 1,500 mg in sodium chloride 0.9 % 250 mL IVPB  1,500 mg Intravenous To SS-Surg Delfina Redwood, MD      . zonisamide (ZONEGRAN) capsule  150 mg  150 mg Oral QHS Charlynne Cousins, MD   150 mg at 09/20/15 2251    ROS:   General:  No weight loss, Fever, chills  HEENT: No recent headaches, no nasal bleeding, no visual changes, no sore throat  Neurologic: No dizziness, blackouts, seizures. No recent symptoms of stroke or mini- stroke. No recent episodes of slurred speech, or temporary blindness.  Cardiac: + recent episodes of chest pain/pressure, no shortness of breath at rest.  + shortness of breath with exertion.  Denies history of atrial fibrillation or irregular heartbeat  Vascular: No history of rest pain in feet.  No history of claudication.  No history of non-healing ulcer, No history of DVT   Pulmonary: No home oxygen, no productive cough, no hemoptysis,  No asthma or wheezing  Musculoskeletal:  [x ] Arthritis, [ ]  Low back pain,  [ ]  Joint pain  Hematologic:No history of hypercoagulable state.  No history of easy bleeding.  No history of anemia  Gastrointestinal: No hematochezia or melena,  No gastroesophageal reflux, no trouble swallowing  Urinary: [ ]  chronic Kidney disease, [ ]  on HD - [ ]  MWF or [ ]  TTHS, [ ]  Burning with urination, [ ]  Frequent urination, [ ]  Difficulty urinating;   Skin: No rashes  Psychological: + history of anxiety,  + history of depression   Physical Examination  Filed Vitals:   09/20/15 1204 09/20/15 1608 09/20/15 1951 09/21/15 0430  BP: 153/54 105/65 143/61 157/58  Pulse: 61 61 57 57  Temp: 98 F (36.7 C) 98.2 F (36.8 C) 98.2 F (36.8 C) 98.1 F (36.7 C)  TempSrc: Oral Oral Oral Oral  Resp: 16 16 18 16   Height:      Weight:    205 lb 6.4 oz (93.169 kg)  SpO2: 96% 96% 98% 94%    Body mass index is 27.85 kg/(m^2).  General:  Alert and oriented, no acute distress HEENT: Normal Neck: No JVD Pulmonary: Clear to auscultation bilaterally Cardiac: Regular Rate and Rhythm Abdomen: Soft, non-tender, non-distended, no mass Skin: No rash Extremity Pulses:  2+ radial,  brachial, femoral, dorsalis pedis pulses bilaterally Musculoskeletal: No deformity or edema  Neurologic: Upper and lower extremity motor 5/5 and symmetric  DATA:  CTA neck 2012, cardiac cath from 09/19/15 and carotid duplex 09/20/15 images  reviewed findings as per HPI   ASSESSMENT:  Mildly symptomatic to asymptomatic high grade innominate artery stenosis not amenable to stenting.  Although risk of CVA slightly higher with combined procedure.  Long term risk of embolization or occlusion with stroke is unknown but returning for redo sternotomy later to address this would also be high risk.   PLAN:  Innominate endarterectomy or bypass combined with CABG tomorrow.  I am unavailable so my partner Dr Donnetta Hutching has agreed to do this operation.  Risks benefits complications including but not limited to bleeding infection stroke risk 5% cranial nerve injury/laryngeal/phrenic explained to pt and he wishes to proceed.  NPO p midnight Consent  Ruta Hinds, MD [facility] [place] Office: [phone] Pager: [phone]

## 2015-09-21 NOTE — Progress Notes (Signed)
MD states to remove IV from left arm, and place restricted armband on. Armband in place. Orders also state no feet sticks

## 2015-09-21 NOTE — Plan of Care (Signed)
Problem: Physical Regulation: Goal: Will remain free from infection Outcome: Progressing Patient has no signs/symptoms of infection. Patient is able to maintain proper hygiene habits. Patient has remained afebrile during admission.  Problem: Tissue Perfusion: Goal: Risk factors for ineffective tissue perfusion will decrease Outcome: Completed/Met Date Met:  09/21/15 Patient is currently being maintained on a heparin drip. Patient is at a low risk for VTE.   Problem: Bowel/Gastric: Goal: Will not experience complications related to bowel motility Outcome: Progressing Patient has complained of constipation; on call MD was paged and orders were received for mirilax. Patient has stated that he will drink miralax in the morning to assist with bowel motility.

## 2015-09-22 ENCOUNTER — Inpatient Hospital Stay (HOSPITAL_COMMUNITY): Payer: Medicare Other | Admitting: Anesthesiology

## 2015-09-22 ENCOUNTER — Inpatient Hospital Stay (HOSPITAL_COMMUNITY): Payer: Medicare Other

## 2015-09-22 ENCOUNTER — Encounter (HOSPITAL_COMMUNITY): Payer: Self-pay | Admitting: Anesthesiology

## 2015-09-22 ENCOUNTER — Encounter (HOSPITAL_COMMUNITY)
Admission: EM | Disposition: A | Discharge status: Home / Self Care | Payer: Medicare Other | Source: Home / Self Care | Attending: Cardiothoracic Surgery

## 2015-09-22 DIAGNOSIS — Z951 Presence of aortocoronary bypass graft: Secondary | ICD-10-CM

## 2015-09-22 HISTORY — PX: CORONARY ARTERY BYPASS GRAFT: SHX141

## 2015-09-22 LAB — POCT I-STAT, CHEM 8
BUN: 15 mg/dL (ref 6–20)
BUN: 12 mg/dL (ref 6–20)
BUN: 13 mg/dL (ref 6–20)
BUN: 13 mg/dL (ref 6–20)
BUN: 13 mg/dL (ref 6–20)
BUN: 13 mg/dL (ref 6–20)
BUN: 11 mg/dL (ref 6–20)
CALCIUM ION: 1.09 mmol/L — AB (ref 1.13–1.30)
CALCIUM ION: 1.11 mmol/L — AB (ref 1.13–1.30)
CALCIUM ION: 1.14 mmol/L (ref 1.13–1.30)
CHLORIDE: 95 mmol/L — AB (ref 101–111)
CHLORIDE: 92 mmol/L — AB (ref 101–111)
CHLORIDE: 98 mmol/L — AB (ref 101–111)
CHLORIDE: 98 mmol/L — AB (ref 101–111)
CHLORIDE: 95 mmol/L — AB (ref 101–111)
CHLORIDE: 102 mmol/L (ref 101–111)
CREATININE: 0.6 mg/dL — AB (ref 0.61–1.24)
CREATININE: 0.5 mg/dL — AB (ref 0.61–1.24)
CREATININE: 0.5 mg/dL — AB (ref 0.61–1.24)
CREATININE: 0.5 mg/dL — AB (ref 0.61–1.24)
CREATININE: 0.6 mg/dL — AB (ref 0.61–1.24)
Calcium, Ion: 1.23 mmol/L (ref 1.13–1.30)
Calcium, Ion: 1.16 mmol/L (ref 1.13–1.30)
Calcium, Ion: 1.18 mmol/L (ref 1.13–1.30)
Calcium, Ion: 1.1 mmol/L — ABNORMAL LOW (ref 1.13–1.30)
Chloride: 95 mmol/L — ABNORMAL LOW (ref 101–111)
Creatinine, Ser: 0.4 mg/dL — ABNORMAL LOW (ref 0.61–1.24)
Creatinine, Ser: 0.5 mg/dL — ABNORMAL LOW (ref 0.61–1.24)
GLUCOSE: 163 mg/dL — AB (ref 65–99)
GLUCOSE: 173 mg/dL — AB (ref 65–99)
GLUCOSE: 199 mg/dL — AB (ref 65–99)
GLUCOSE: 210 mg/dL — AB (ref 65–99)
GLUCOSE: 152 mg/dL — AB (ref 65–99)
Glucose, Bld: 138 mg/dL — ABNORMAL HIGH (ref 65–99)
Glucose, Bld: 166 mg/dL — ABNORMAL HIGH (ref 65–99)
HCT: 28 % — ABNORMAL LOW (ref 39.0–52.0)
HCT: 34 % — ABNORMAL LOW (ref 39.0–52.0)
HCT: 30 % — ABNORMAL LOW (ref 39.0–52.0)
HCT: 30 % — ABNORMAL LOW (ref 39.0–52.0)
HCT: 35 % — ABNORMAL LOW (ref 39.0–52.0)
HEMATOCRIT: 38 % — AB (ref 39.0–52.0)
HEMATOCRIT: 33 % — AB (ref 39.0–52.0)
HEMOGLOBIN: 10.2 g/dL — AB (ref 13.0–17.0)
Hemoglobin: 12.9 g/dL — ABNORMAL LOW (ref 13.0–17.0)
Hemoglobin: 11.2 g/dL — ABNORMAL LOW (ref 13.0–17.0)
Hemoglobin: 11.6 g/dL — ABNORMAL LOW (ref 13.0–17.0)
Hemoglobin: 9.5 g/dL — ABNORMAL LOW (ref 13.0–17.0)
Hemoglobin: 10.2 g/dL — ABNORMAL LOW (ref 13.0–17.0)
Hemoglobin: 11.9 g/dL — ABNORMAL LOW (ref 13.0–17.0)
POTASSIUM: 4.1 mmol/L (ref 3.5–5.1)
POTASSIUM: 3.8 mmol/L (ref 3.5–5.1)
POTASSIUM: 3.9 mmol/L (ref 3.5–5.1)
POTASSIUM: 4.3 mmol/L (ref 3.5–5.1)
POTASSIUM: 3.6 mmol/L (ref 3.5–5.1)
Potassium: 3.3 mmol/L — ABNORMAL LOW (ref 3.5–5.1)
Potassium: 4.3 mmol/L (ref 3.5–5.1)
SODIUM: 134 mmol/L — AB (ref 135–145)
Sodium: 132 mmol/L — ABNORMAL LOW (ref 135–145)
Sodium: 133 mmol/L — ABNORMAL LOW (ref 135–145)
Sodium: 134 mmol/L — ABNORMAL LOW (ref 135–145)
Sodium: 132 mmol/L — ABNORMAL LOW (ref 135–145)
Sodium: 133 mmol/L — ABNORMAL LOW (ref 135–145)
Sodium: 137 mmol/L (ref 135–145)
TCO2: 27 mmol/L (ref 0–100)
TCO2: 24 mmol/L (ref 0–100)
TCO2: 25 mmol/L (ref 0–100)
TCO2: 25 mmol/L (ref 0–100)
TCO2: 23 mmol/L (ref 0–100)
TCO2: 24 mmol/L (ref 0–100)
TCO2: 23 mmol/L (ref 0–100)

## 2015-09-22 LAB — PLATELET COUNT: Platelets: 166 10*3/uL (ref 150–400)

## 2015-09-22 LAB — POCT I-STAT 3, ART BLOOD GAS (G3+)
ACID-BASE DEFICIT: 4 mmol/L — AB (ref 0.0–2.0)
ACID-BASE DEFICIT: 6 mmol/L — AB (ref 0.0–2.0)
ACID-BASE DEFICIT: 3 mmol/L — AB (ref 0.0–2.0)
BICARBONATE: 25.4 meq/L — AB (ref 20.0–24.0)
BICARBONATE: 20.4 meq/L (ref 20.0–24.0)
BICARBONATE: 22.8 meq/L (ref 20.0–24.0)
Bicarbonate: 23.3 mEq/L (ref 20.0–24.0)
O2 SAT: 100 %
O2 SAT: 100 %
O2 SAT: 98 %
O2 SAT: 95 %
PCO2 ART: 48.4 mmHg — AB (ref 35.0–45.0)
PH ART: 7.289 — AB (ref 7.350–7.450)
PO2 ART: 109 mmHg — AB (ref 80.0–100.0)
PO2 ART: 81 mmHg (ref 80.0–100.0)
Patient temperature: 36.7
TCO2: 27 mmol/L (ref 0–100)
TCO2: 25 mmol/L (ref 0–100)
TCO2: 22 mmol/L (ref 0–100)
TCO2: 24 mmol/L (ref 0–100)
pCO2 arterial: 42.3 mmHg (ref 35.0–45.0)
pCO2 arterial: 43.8 mmHg (ref 35.0–45.0)
pCO2 arterial: 44.4 mmHg (ref 35.0–45.0)
pH, Arterial: 7.387 (ref 7.350–7.450)
pH, Arterial: 7.275 — ABNORMAL LOW (ref 7.350–7.450)
pH, Arterial: 7.317 — ABNORMAL LOW (ref 7.350–7.450)
pO2, Arterial: 224 mmHg — ABNORMAL HIGH (ref 80.0–100.0)
pO2, Arterial: 248 mmHg — ABNORMAL HIGH (ref 80.0–100.0)

## 2015-09-22 LAB — BASIC METABOLIC PANEL
Anion gap: 10 (ref 5–15)
BUN: 14 mg/dL (ref 6–20)
CO2: 26 mmol/L (ref 22–32)
Calcium: 9.2 mg/dL (ref 8.9–10.3)
Chloride: 96 mmol/L — ABNORMAL LOW (ref 101–111)
Creatinine, Ser: 0.8 mg/dL (ref 0.61–1.24)
GFR calc Af Amer: >60 mL/min (ref >60)
GFR calc non Af Amer: >60 mL/min (ref >60)
Glucose, Bld: 138 mg/dL — ABNORMAL HIGH (ref 65–99)
Potassium: 4 mmol/L (ref 3.5–5.1)
Sodium: 132 mmol/L — ABNORMAL LOW (ref 135–145)

## 2015-09-22 LAB — PULMONARY FUNCTION TEST
FEF 25-75 Post: 3.55 L/sec
FEF 25-75 Pre: 2.91 L/sec
FEF2575-%Change-Post: 21 %
FEF2575-%Pred-Post: 131 %
FEF2575-%Pred-Pre: 108 %
FEV1-%Change-Post: 11 %
FEV1-%Pred-Post: 98 %
FEV1-%Pred-Pre: 88 %
FEV1-Post: 3.48 L
FEV1-Pre: 3.13 L
FEV1FVC-%Change-Post: -6 %
FEV1FVC-%Pred-Pre: 104 %
FEV6-%Change-Post: 22 %
FEV6-%Pred-Post: 105 %
FEV6-%Pred-Pre: 86 %
FEV6-Post: 4.78 L
FEV6-Pre: 3.91 L
FEV6FVC-%Change-Post: -1 %
FEV6FVC-%Pred-Post: 103 %
FEV6FVC-%Pred-Pre: 105 %
FVC-%Change-Post: 19 %
FVC-%Pred-Post: 100 %
FVC-%Pred-Pre: 84 %
FVC-Post: 4.84 L
FVC-Pre: 4.06 L
Post FEV1/FVC ratio: 72 %
Post FEV6/FVC ratio: 99 %
Pre FEV1/FVC ratio: 77 %
Pre FEV6/FVC Ratio: 100 %

## 2015-09-22 LAB — HEMOGLOBIN AND HEMATOCRIT, BLOOD
HCT: 26.6 % — ABNORMAL LOW (ref 39.0–52.0)
Hemoglobin: 9 g/dL — ABNORMAL LOW (ref 13.0–17.0)

## 2015-09-22 LAB — CBC
HCT: 36.4 % — ABNORMAL LOW (ref 39.0–52.0)
HCT: 34.4 % — ABNORMAL LOW (ref 39.0–52.0)
HCT: 34.1 % — ABNORMAL LOW (ref 39.0–52.0)
HEMOGLOBIN: 11.5 g/dL — AB (ref 13.0–17.0)
Hemoglobin: 12.3 g/dL — ABNORMAL LOW (ref 13.0–17.0)
Hemoglobin: 11.2 g/dL — ABNORMAL LOW (ref 13.0–17.0)
MCH: 29.1 pg (ref 26.0–34.0)
MCH: 29 pg (ref 26.0–34.0)
MCH: 28.1 pg (ref 26.0–34.0)
MCHC: 33.8 g/dL (ref 30.0–36.0)
MCHC: 33.4 g/dL (ref 30.0–36.0)
MCHC: 32.8 g/dL (ref 30.0–36.0)
MCV: 86.3 fL (ref 78.0–100.0)
MCV: 86.6 fL (ref 78.0–100.0)
MCV: 85.5 fL (ref 78.0–100.0)
PLATELETS: 246 10*3/uL (ref 150–400)
Platelets: 160 10*3/uL (ref 150–400)
Platelets: 167 10*3/uL (ref 150–400)
RBC: 4.22 MIL/uL (ref 4.22–5.81)
RBC: 3.97 MIL/uL — ABNORMAL LOW (ref 4.22–5.81)
RBC: 3.99 MIL/uL — ABNORMAL LOW (ref 4.22–5.81)
RDW: 13.9 % (ref 11.5–15.5)
RDW: 13.9 % (ref 11.5–15.5)
RDW: 13.8 % (ref 11.5–15.5)
WBC: 5.5 10*3/uL (ref 4.0–10.5)
WBC: 8.7 10*3/uL (ref 4.0–10.5)
WBC: 9.1 10*3/uL (ref 4.0–10.5)

## 2015-09-22 LAB — POCT I-STAT 4, (NA,K, GLUC, HGB,HCT)
GLUCOSE: 154 mg/dL — AB (ref 65–99)
HCT: 38 % — ABNORMAL LOW (ref 39.0–52.0)
HEMOGLOBIN: 12.9 g/dL — AB (ref 13.0–17.0)
POTASSIUM: 3.9 mmol/L (ref 3.5–5.1)
Sodium: 134 mmol/L — ABNORMAL LOW (ref 135–145)

## 2015-09-22 LAB — HEPARIN LEVEL (UNFRACTIONATED): HEPARIN UNFRACTIONATED: 0.32 [IU]/mL (ref 0.30–0.70)

## 2015-09-22 LAB — CREATININE, SERUM
Creatinine, Ser: 0.56 mg/dL — ABNORMAL LOW (ref 0.61–1.24)
GFR calc Af Amer: >60 mL/min (ref >60)
GFR calc non Af Amer: >60 mL/min (ref >60)

## 2015-09-22 LAB — PROTIME-INR
INR: 1.37 (ref 0.00–1.49)
PROTHROMBIN TIME: 16.9 s — AB (ref 11.6–15.2)

## 2015-09-22 LAB — HEMOGLOBIN A1C
Hgb A1c MFr Bld: 7 % — ABNORMAL HIGH (ref 4.8–5.6)
Mean Plasma Glucose: 154 mg/dL

## 2015-09-22 LAB — MAGNESIUM: Magnesium: 2.5 mg/dL — ABNORMAL HIGH (ref 1.7–2.4)

## 2015-09-22 LAB — APTT
aPTT: 74 seconds — ABNORMAL HIGH (ref 24–37)
aPTT: 35 seconds (ref 24–37)

## 2015-09-22 LAB — GLUCOSE, CAPILLARY: GLUCOSE-CAPILLARY: 124 mg/dL — AB (ref 65–99)

## 2015-09-22 SURGERY — CORONARY ARTERY BYPASS GRAFTING (CABG)
Anesthesia: General | Site: Chest

## 2015-09-22 MED ORDER — SODIUM CHLORIDE 0.9 % IJ SOLN
INTRAMUSCULAR | Status: AC
  Filled 2015-09-22: qty 10

## 2015-09-22 MED ORDER — ACETAMINOPHEN 160 MG/5ML PO SOLN: 1000.0000 mg | Freq: Four times a day (QID) | ORAL | Status: DC

## 2015-09-22 MED ORDER — GLYCOPYRROLATE 0.2 MG/ML IJ SOLN
INTRAMUSCULAR | Status: AC
  Filled 2015-09-22: qty 3

## 2015-09-22 MED ORDER — DEXTROSE 5 % IV SOLN
1.5000 g | Freq: Two times a day (BID) | INTRAVENOUS | Status: DC
  Administered 2015-09-22 – 2015-09-23 (×3): 1.5 g via INTRAVENOUS
  Filled 2015-09-22 (×4): qty 1.5

## 2015-09-22 MED ORDER — METOPROLOL TARTRATE 1 MG/ML IV SOLN
2.5000 mg | INTRAVENOUS | Status: DC | PRN
  Administered 2015-09-22 – 2015-09-23 (×3): 2.5 mg via INTRAVENOUS
  Filled 2015-09-22 (×3): qty 5

## 2015-09-22 MED ORDER — POTASSIUM CHLORIDE 10 MEQ/50ML IV SOLN
10.0000 meq | INTRAVENOUS | Status: AC
  Administered 2015-09-22 (×3): 10 meq via INTRAVENOUS

## 2015-09-22 MED ORDER — ALBUMIN HUMAN 5 % IV SOLN
250.0000 mL | INTRAVENOUS | Status: AC | PRN
  Administered 2015-09-22: 250 mL via INTRAVENOUS

## 2015-09-22 MED ORDER — HEPARIN SODIUM (PORCINE) 1000 UNIT/ML IJ SOLN
INTRAMUSCULAR | Status: AC
  Filled 2015-09-22: qty 1

## 2015-09-22 MED ORDER — SODIUM CHLORIDE 0.45 % IV SOLN
INTRAVENOUS | Status: DC | PRN
  Administered 2015-09-22: 14:00:00 via INTRAVENOUS

## 2015-09-22 MED ORDER — ACETAMINOPHEN 160 MG/5ML PO SOLN: 650.0000 mg | Freq: Once | ORAL | Status: AC

## 2015-09-22 MED ORDER — DOCUSATE SODIUM 100 MG PO CAPS
200.0000 mg | ORAL_CAPSULE | Freq: Every day | ORAL | Status: DC
  Administered 2015-09-23: 200 mg via ORAL
  Filled 2015-09-22: qty 2

## 2015-09-22 MED ORDER — BISACODYL 10 MG RE SUPP: 10.0000 mg | Freq: Every day | RECTAL | Status: DC

## 2015-09-22 MED ORDER — TRAMADOL HCL 50 MG PO TABS
50.0000 mg | ORAL_TABLET | ORAL | Status: DC | PRN
  Administered 2015-09-24: 100 mg via ORAL
  Filled 2015-09-22: qty 2

## 2015-09-22 MED ORDER — MIDAZOLAM HCL 2 MG/2ML IJ SOLN: 2.0000 mg | INTRAMUSCULAR | Status: DC | PRN

## 2015-09-22 MED ORDER — FENTANYL 25 MCG/HR TD PT72
25.0000 ug | MEDICATED_PATCH | TRANSDERMAL | Status: DC
  Administered 2015-09-22: 25 ug via TRANSDERMAL
  Filled 2015-09-22: qty 1

## 2015-09-22 MED ORDER — EPHEDRINE SULFATE 50 MG/ML IJ SOLN
INTRAMUSCULAR | Status: AC
  Filled 2015-09-22: qty 1

## 2015-09-22 MED ORDER — ALBUTEROL SULFATE (2.5 MG/3ML) 0.083% IN NEBU
2.5000 mg | INHALATION_SOLUTION | Freq: Once | RESPIRATORY_TRACT | Status: AC
  Administered 2015-09-22: 2.5 mg via RESPIRATORY_TRACT

## 2015-09-22 MED ORDER — ACETAMINOPHEN 650 MG RE SUPP
650.0000 mg | Freq: Once | RECTAL | Status: AC
  Administered 2015-09-22: 650 mg via RECTAL

## 2015-09-22 MED ORDER — OXYCODONE HCL 5 MG PO TABS
5.0000 mg | ORAL_TABLET | ORAL | Status: DC | PRN
  Administered 2015-09-22 – 2015-09-24 (×6): 10 mg via ORAL
  Filled 2015-09-22 (×7): qty 2

## 2015-09-22 MED ORDER — 0.9 % SODIUM CHLORIDE (POUR BTL) OPTIME
TOPICAL | Status: DC | PRN
  Administered 2015-09-22: 5000 mL

## 2015-09-22 MED ORDER — SODIUM CHLORIDE 0.9 % IV SOLN
INTRAVENOUS | Status: DC | PRN
  Administered 2015-09-22: 13:00:00 via INTRAVENOUS

## 2015-09-22 MED ORDER — PANTOPRAZOLE SODIUM 40 MG PO TBEC: 40.0000 mg | DELAYED_RELEASE_TABLET | Freq: Every day | ORAL | Status: DC

## 2015-09-22 MED ORDER — SUCCINYLCHOLINE CHLORIDE 20 MG/ML IJ SOLN
INTRAMUSCULAR | Status: AC
  Filled 2015-09-22: qty 1

## 2015-09-22 MED ORDER — LACTATED RINGERS IV SOLN
INTRAVENOUS | Status: DC | PRN
  Administered 2015-09-22: 07:00:00 via INTRAVENOUS

## 2015-09-22 MED ORDER — MIDAZOLAM HCL 5 MG/5ML IJ SOLN
INTRAMUSCULAR | Status: DC | PRN
  Administered 2015-09-22 (×3): 3 mg via INTRAVENOUS
  Administered 2015-09-22: 1 mg via INTRAVENOUS
  Administered 2015-09-22: 2 mg via INTRAVENOUS

## 2015-09-22 MED ORDER — ASPIRIN EC 325 MG PO TBEC
325.0000 mg | DELAYED_RELEASE_TABLET | Freq: Every day | ORAL | Status: DC
  Administered 2015-09-23: 325 mg via ORAL
  Filled 2015-09-22: qty 1

## 2015-09-22 MED ORDER — FENTANYL CITRATE (PF) 100 MCG/2ML IJ SOLN
INTRAMUSCULAR | Status: DC | PRN
  Administered 2015-09-22: 50 ug via INTRAVENOUS
  Administered 2015-09-22: 150 ug via INTRAVENOUS
  Administered 2015-09-22: 100 ug via INTRAVENOUS
  Administered 2015-09-22: 150 ug via INTRAVENOUS
  Administered 2015-09-22 (×3): 50 ug via INTRAVENOUS
  Administered 2015-09-22: 100 ug via INTRAVENOUS
  Administered 2015-09-22: 150 ug via INTRAVENOUS
  Administered 2015-09-22: 500 ug via INTRAVENOUS
  Administered 2015-09-22: 150 ug via INTRAVENOUS

## 2015-09-22 MED ORDER — PROTAMINE SULFATE 10 MG/ML IV SOLN
INTRAVENOUS | Status: AC
  Filled 2015-09-22: qty 25

## 2015-09-22 MED ORDER — PROPOFOL 10 MG/ML IV BOLUS
INTRAVENOUS | Status: DC | PRN
  Administered 2015-09-22: 60 mg via INTRAVENOUS
  Administered 2015-09-22: 100 mg via INTRAVENOUS
  Administered 2015-09-22: 40 mg via INTRAVENOUS

## 2015-09-22 MED ORDER — PROPOFOL 10 MG/ML IV BOLUS
INTRAVENOUS | Status: AC
  Filled 2015-09-22: qty 20

## 2015-09-22 MED ORDER — MORPHINE SULFATE (PF) 2 MG/ML IV SOLN
1.0000 mg | INTRAVENOUS | Status: DC | PRN
  Administered 2015-09-22: 2 mg via INTRAVENOUS
  Administered 2015-09-22: 4 mg via INTRAVENOUS
  Administered 2015-09-22 (×2): 2 mg via INTRAVENOUS
  Filled 2015-09-22: qty 1

## 2015-09-22 MED ORDER — ROCURONIUM BROMIDE 50 MG/5ML IV SOLN
INTRAVENOUS | Status: AC
  Filled 2015-09-22: qty 2

## 2015-09-22 MED ORDER — PHENYLEPHRINE HCL 10 MG/ML IJ SOLN
10.0000 mg | INTRAVENOUS | Status: DC | PRN
  Administered 2015-09-22: 20 ug/min via INTRAVENOUS

## 2015-09-22 MED ORDER — METOPROLOL TARTRATE 25 MG/10 ML ORAL SUSPENSION: 12.5000 mg | Freq: Two times a day (BID) | ORAL | Status: DC

## 2015-09-22 MED ORDER — EPHEDRINE SULFATE 50 MG/ML IJ SOLN
INTRAMUSCULAR | Status: DC | PRN
  Administered 2015-09-22: 20 mg via INTRAVENOUS
  Administered 2015-09-22: 15 mg via INTRAVENOUS

## 2015-09-22 MED ORDER — PHENYLEPHRINE HCL 10 MG/ML IJ SOLN
0.0000 ug/min | INTRAVENOUS | Status: DC
  Filled 2015-09-22: qty 2

## 2015-09-22 MED ORDER — METOPROLOL TARTRATE 12.5 MG HALF TABLET
12.5000 mg | ORAL_TABLET | Freq: Two times a day (BID) | ORAL | Status: DC
  Administered 2015-09-22 – 2015-09-23 (×3): 12.5 mg via ORAL
  Filled 2015-09-22 (×3): qty 1

## 2015-09-22 MED ORDER — SODIUM CHLORIDE 0.9 % IV SOLN: 250.0000 mL | INTRAVENOUS | Status: DC

## 2015-09-22 MED ORDER — SODIUM CHLORIDE 0.9 % IV SOLN
INTRAVENOUS | Status: DC
  Administered 2015-09-22 (×2): via INTRAVENOUS

## 2015-09-22 MED ORDER — INSULIN REGULAR BOLUS VIA INFUSION
0.0000 [IU] | Freq: Three times a day (TID) | INTRAVENOUS | Status: DC
  Filled 2015-09-22: qty 10

## 2015-09-22 MED ORDER — PHENYLEPHRINE HCL 10 MG/ML IJ SOLN
20.0000 mg | INTRAVENOUS | Status: DC | PRN
  Administered 2015-09-22: 15 ug/min via INTRAVENOUS

## 2015-09-22 MED ORDER — FENTANYL CITRATE (PF) 250 MCG/5ML IJ SOLN
INTRAMUSCULAR | Status: AC
  Filled 2015-09-22: qty 5

## 2015-09-22 MED ORDER — GLYCOPYRROLATE 0.2 MG/ML IJ SOLN
INTRAMUSCULAR | Status: DC | PRN
  Administered 2015-09-22 (×3): .2 mg via INTRAVENOUS

## 2015-09-22 MED ORDER — FENTANYL CITRATE (PF) 250 MCG/5ML IJ SOLN
INTRAMUSCULAR | Status: AC
  Filled 2015-09-22: qty 25

## 2015-09-22 MED ORDER — SODIUM CHLORIDE 0.9 % IV SOLN
INTRAVENOUS | Status: DC
  Filled 2015-09-22: qty 2.5

## 2015-09-22 MED ORDER — ROCURONIUM BROMIDE 100 MG/10ML IV SOLN
INTRAVENOUS | Status: DC | PRN
  Administered 2015-09-22: 60 mg via INTRAVENOUS
  Administered 2015-09-22 (×2): 50 mg via INTRAVENOUS
  Administered 2015-09-22: 40 mg via INTRAVENOUS
  Administered 2015-09-22: 50 mg via INTRAVENOUS

## 2015-09-22 MED ORDER — LACTATED RINGERS IV SOLN: 500.0000 mL | Freq: Once | INTRAVENOUS | Status: DC | PRN

## 2015-09-22 MED ORDER — LACTATED RINGERS IV SOLN
INTRAVENOUS | Status: DC | PRN
  Administered 2015-09-22 (×2): via INTRAVENOUS

## 2015-09-22 MED ORDER — ASPIRIN 81 MG PO CHEW
324.0000 mg | CHEWABLE_TABLET | Freq: Every day | ORAL | Status: DC
Start: 2015-09-23 — End: 2015-09-24

## 2015-09-22 MED ORDER — SODIUM CHLORIDE 0.9% FLUSH: 3.0000 mL | INTRAVENOUS | Status: DC | PRN

## 2015-09-22 MED ORDER — SODIUM CHLORIDE 0.9 % IJ SOLN
OROMUCOSAL | Status: DC | PRN
  Administered 2015-09-22 (×3): 4 mL via TOPICAL

## 2015-09-22 MED ORDER — SODIUM BICARBONATE 8.4 % IV SOLN
50.0000 meq | Freq: Once | INTRAVENOUS | Status: AC
  Administered 2015-09-22: 50 meq via INTRAVENOUS

## 2015-09-22 MED ORDER — ACETAMINOPHEN 500 MG PO TABS
1000.0000 mg | ORAL_TABLET | Freq: Four times a day (QID) | ORAL | Status: DC
  Administered 2015-09-22 – 2015-09-24 (×5): 1000 mg via ORAL
  Filled 2015-09-22 (×4): qty 2

## 2015-09-22 MED ORDER — BISACODYL 5 MG PO TBEC
10.0000 mg | DELAYED_RELEASE_TABLET | Freq: Every day | ORAL | Status: DC
  Administered 2015-09-23: 10 mg via ORAL
  Filled 2015-09-22: qty 2

## 2015-09-22 MED ORDER — HEMOSTATIC AGENTS (NO CHARGE) OPTIME
TOPICAL | Status: DC | PRN
  Administered 2015-09-22 (×2): 1 via TOPICAL

## 2015-09-22 MED ORDER — LACTATED RINGERS IV SOLN
INTRAVENOUS | Status: DC
  Administered 2015-09-22: 14:00:00 via INTRAVENOUS

## 2015-09-22 MED ORDER — MIDAZOLAM HCL 2 MG/2ML IJ SOLN
INTRAMUSCULAR | Status: AC
  Filled 2015-09-22: qty 2

## 2015-09-22 MED ORDER — PROTAMINE SULFATE 10 MG/ML IV SOLN
INTRAVENOUS | Status: DC | PRN
  Administered 2015-09-22 (×2): 50 mg via INTRAVENOUS
  Administered 2015-09-22: 40 mg via INTRAVENOUS
  Administered 2015-09-22: 10 mg via INTRAVENOUS
  Administered 2015-09-22: 50 mg via INTRAVENOUS
  Administered 2015-09-22: 30 mg via INTRAVENOUS

## 2015-09-22 MED ORDER — SODIUM CHLORIDE 0.9% FLUSH
3.0000 mL | Freq: Two times a day (BID) | INTRAVENOUS | Status: DC
Start: 2015-09-23 — End: 2015-09-24
  Administered 2015-09-23 (×2): 3 mL via INTRAVENOUS

## 2015-09-22 MED ORDER — MIDAZOLAM HCL 10 MG/2ML IJ SOLN
INTRAMUSCULAR | Status: AC
  Filled 2015-09-22: qty 2

## 2015-09-22 MED ORDER — NITROGLYCERIN IN D5W 200-5 MCG/ML-% IV SOLN
0.0000 ug/min | INTRAVENOUS | Status: DC
  Administered 2015-09-23: 80 ug/min via INTRAVENOUS
  Filled 2015-09-22: qty 250

## 2015-09-22 MED ORDER — MORPHINE SULFATE (PF) 2 MG/ML IV SOLN
2.0000 mg | INTRAVENOUS | Status: DC | PRN
  Administered 2015-09-22 (×2): 2 mg via INTRAVENOUS
  Administered 2015-09-23: 4 mg via INTRAVENOUS
  Administered 2015-09-23: 5 mg via INTRAVENOUS
  Administered 2015-09-23: 4 mg via INTRAVENOUS
  Administered 2015-09-24: 2 mg via INTRAVENOUS
  Filled 2015-09-22 (×3): qty 1
  Filled 2015-09-22 (×3): qty 2
  Filled 2015-09-22: qty 1
  Filled 2015-09-22: qty 3
  Filled 2015-09-22: qty 1

## 2015-09-22 MED ORDER — CHLORHEXIDINE GLUCONATE 0.12 % MT SOLN
15.0000 mL | OROMUCOSAL | Status: AC
  Administered 2015-09-22: 15 mL via OROMUCOSAL
  Filled 2015-09-22: qty 15

## 2015-09-22 MED ORDER — PHENYLEPHRINE HCL 10 MG/ML IJ SOLN
INTRAMUSCULAR | Status: AC
Start: 2015-09-22 — End: 2015-09-22
  Filled 2015-09-22: qty 1

## 2015-09-22 MED ORDER — LACTATED RINGERS IV SOLN: INTRAVENOUS | Status: DC

## 2015-09-22 MED ORDER — ONDANSETRON HCL 4 MG/2ML IJ SOLN
4.0000 mg | Freq: Four times a day (QID) | INTRAMUSCULAR | Status: DC | PRN
  Administered 2015-09-22 – 2015-09-23 (×3): 4 mg via INTRAVENOUS
  Filled 2015-09-22 (×3): qty 2

## 2015-09-22 MED ORDER — LACTATED RINGERS IV SOLN
INTRAVENOUS | Status: DC | PRN
Start: 2015-09-22 — End: 2015-09-22
  Administered 2015-09-22: 07:00:00 via INTRAVENOUS

## 2015-09-22 MED ORDER — FAMOTIDINE IN NACL 20-0.9 MG/50ML-% IV SOLN
20.0000 mg | Freq: Two times a day (BID) | INTRAVENOUS | Status: AC
  Administered 2015-09-22: 20 mg via INTRAVENOUS

## 2015-09-22 MED ORDER — MAGNESIUM SULFATE 4 GM/100ML IV SOLN
4.0000 g | Freq: Once | INTRAVENOUS | Status: AC
  Administered 2015-09-22: 4 g via INTRAVENOUS
  Filled 2015-09-22: qty 100

## 2015-09-22 MED ORDER — PHENYLEPHRINE 40 MCG/ML (10ML) SYRINGE FOR IV PUSH (FOR BLOOD PRESSURE SUPPORT)
PREFILLED_SYRINGE | INTRAVENOUS | Status: AC
  Filled 2015-09-22: qty 10

## 2015-09-22 MED ORDER — ROCURONIUM BROMIDE 50 MG/5ML IV SOLN
INTRAVENOUS | Status: AC
  Filled 2015-09-22: qty 1

## 2015-09-22 MED ORDER — VANCOMYCIN HCL IN DEXTROSE 1-5 GM/200ML-% IV SOLN
1000.0000 mg | Freq: Once | INTRAVENOUS | Status: AC
  Administered 2015-09-22: 1000 mg via INTRAVENOUS
  Filled 2015-09-22: qty 200

## 2015-09-22 MED ORDER — LIDOCAINE HCL (CARDIAC) 20 MG/ML IV SOLN
INTRAVENOUS | Status: AC
  Filled 2015-09-22: qty 5

## 2015-09-22 MED ORDER — HEPARIN SODIUM (PORCINE) 1000 UNIT/ML IJ SOLN
INTRAMUSCULAR | Status: DC | PRN
  Administered 2015-09-22: 2000 [IU] via INTRAVENOUS
  Administered 2015-09-22: 28000 [IU] via INTRAVENOUS

## 2015-09-22 MED ORDER — DEXMEDETOMIDINE HCL IN NACL 200 MCG/50ML IV SOLN
0.0000 ug/kg/h | INTRAVENOUS | Status: DC
  Administered 2015-09-22: 0.4 ug/kg/h via INTRAVENOUS
  Filled 2015-09-22: qty 50

## 2015-09-22 SURGICAL SUPPLY — 79 items
BAG DECANTER FOR FLEXI CONT (MISCELLANEOUS) ×2 IMPLANT
BANDAGE ACE 4X5 VEL STRL LF (GAUZE/BANDAGES/DRESSINGS) ×2 IMPLANT
BANDAGE ACE 6X5 VEL STRL LF (GAUZE/BANDAGES/DRESSINGS) ×2 IMPLANT
BANDAGE ELASTIC 4 VELCRO ST LF (GAUZE/BANDAGES/DRESSINGS) ×2 IMPLANT
BANDAGE ELASTIC 6 VELCRO ST LF (GAUZE/BANDAGES/DRESSINGS) ×2 IMPLANT
BENZOIN TINCTURE PRP APPL 2/3 (GAUZE/BANDAGES/DRESSINGS) ×2 IMPLANT
BLADE MINI RND TIP GREEN BEAV (BLADE) ×2 IMPLANT
BLADE STERNUM SYSTEM 6 (BLADE) ×2 IMPLANT
BLADE SURG 15 STRL LF DISP TIS (BLADE) ×1 IMPLANT
BLADE SURG 15 STRL SS (BLADE) ×1
BNDG GAUZE ELAST 4 BULKY (GAUZE/BANDAGES/DRESSINGS) ×2 IMPLANT
CANISTER SUCTION 2500CC (MISCELLANEOUS) ×2 IMPLANT
CATH CPB KIT GERHARDT (MISCELLANEOUS) ×2 IMPLANT
CATH THORACIC 28FR (CATHETERS) ×2 IMPLANT
COVER MAYO STAND STRL (DRAPES) ×2 IMPLANT
COVER TABLE BACK 60X90 (DRAPES) ×2 IMPLANT
CRADLE DONUT ADULT HEAD (MISCELLANEOUS) ×2 IMPLANT
DRAIN CHANNEL 28F RND 3/8 FF (WOUND CARE) ×2 IMPLANT
DRAPE CARDIOVASCULAR INCISE (DRAPES) ×1
DRAPE EXTREMITY T 121X128X90 (DRAPE) ×2 IMPLANT
DRAPE SLUSH/WARMER DISC (DRAPES) ×2 IMPLANT
DRAPE SRG 135X102X78XABS (DRAPES) ×1 IMPLANT
DRSG AQUACEL AG ADV 3.5X14 (GAUZE/BANDAGES/DRESSINGS) ×2 IMPLANT
ELECT BLADE 4.0 EZ CLEAN MEGAD (MISCELLANEOUS) ×2
ELECT REM PT RETURN 9FT ADLT (ELECTROSURGICAL) ×4
ELECTRODE BLDE 4.0 EZ CLN MEGD (MISCELLANEOUS) ×1 IMPLANT
ELECTRODE REM PT RTRN 9FT ADLT (ELECTROSURGICAL) ×2 IMPLANT
FELT TEFLON 1X6 (MISCELLANEOUS) ×2 IMPLANT
GAUZE SPONGE 4X4 12PLY STRL (GAUZE/BANDAGES/DRESSINGS) ×4 IMPLANT
GLOVE BIO SURGEON STRL SZ 6.5 (GLOVE) ×12 IMPLANT
GLOVE BIO SURGEON STRL SZ7 (GLOVE) ×8 IMPLANT
GLOVE BIO SURGEON STRL SZ7.5 (GLOVE) ×6 IMPLANT
GLOVE BIOGEL PI IND STRL 6 (GLOVE) ×3 IMPLANT
GLOVE BIOGEL PI IND STRL 6.5 (GLOVE) ×4 IMPLANT
GLOVE BIOGEL PI IND STRL 7.0 (GLOVE) ×3 IMPLANT
GLOVE BIOGEL PI INDICATOR 6 (GLOVE) ×3
GLOVE BIOGEL PI INDICATOR 6.5 (GLOVE) ×4
GLOVE BIOGEL PI INDICATOR 7.0 (GLOVE) ×3
GOWN STRL REUS W/ TWL LRG LVL3 (GOWN DISPOSABLE) ×8 IMPLANT
GOWN STRL REUS W/TWL LRG LVL3 (GOWN DISPOSABLE) ×8
HEMOSTAT POWDER SURGIFOAM 1G (HEMOSTASIS) ×6 IMPLANT
HEMOSTAT SURGICEL 2X14 (HEMOSTASIS) ×2 IMPLANT
KIT BASIN OR (CUSTOM PROCEDURE TRAY) ×2 IMPLANT
KIT CATH SUCT 8FR (CATHETERS) ×2 IMPLANT
KIT ROOM TURNOVER OR (KITS) ×2 IMPLANT
KIT SUCTION CATH 14FR (SUCTIONS) ×4 IMPLANT
KIT VASOVIEW W/TROCAR VH 2000 (KITS) ×2 IMPLANT
LEAD PACING MYOCARDI (MISCELLANEOUS) ×2 IMPLANT
MARKER GRAFT CORONARY BYPASS (MISCELLANEOUS) ×6 IMPLANT
NS IRRIG 1000ML POUR BTL (IV SOLUTION) ×12 IMPLANT
PACK OPEN HEART (CUSTOM PROCEDURE TRAY) ×2 IMPLANT
PAD ARMBOARD 7.5X6 YLW CONV (MISCELLANEOUS) ×4 IMPLANT
PAD CARDIAC INSULATION (MISCELLANEOUS) ×2 IMPLANT
PAD ELECT DEFIB RADIOL ZOLL (MISCELLANEOUS) ×2 IMPLANT
PENCIL BUTTON HOLSTER BLD 10FT (ELECTRODE) ×2 IMPLANT
SET CARDIOPLEGIA MPS 5001102 (MISCELLANEOUS) ×2 IMPLANT
SPONGE GAUZE 4X4 12PLY STER LF (GAUZE/BANDAGES/DRESSINGS) ×4 IMPLANT
SPONGE LAP 18X18 X RAY DECT (DISPOSABLE) ×8 IMPLANT
SURGIFLO W/THROMBIN 8M KIT (HEMOSTASIS) ×2 IMPLANT
SUT BONE WAX W31G (SUTURE) ×2 IMPLANT
SUT PROLENE 3 0 SH1 36 (SUTURE) ×2 IMPLANT
SUT PROLENE 4 0 TF (SUTURE) ×4 IMPLANT
SUT PROLENE 6 0 CC (SUTURE) ×4 IMPLANT
SUT PROLENE 7 0 BV1 MDA (SUTURE) ×10 IMPLANT
SUT PROLENE 7.0 RB 3 (SUTURE) ×4 IMPLANT
SUT SILK 2 0 SH CR/8 (SUTURE) ×2 IMPLANT
SUT STEEL 6MS V (SUTURE) ×2 IMPLANT
SUT STEEL SZ 6 DBL 3X14 BALL (SUTURE) ×2 IMPLANT
SUT VIC AB 1 CTX 18 (SUTURE) ×4 IMPLANT
SUTURE E-PAK OPEN HEART (SUTURE) ×2 IMPLANT
SYSTEM SAHARA CHEST DRAIN ATS (WOUND CARE) ×2 IMPLANT
TAPE CLOTH SURG 4X10 WHT LF (GAUZE/BANDAGES/DRESSINGS) ×2 IMPLANT
TAPE PAPER 2X10 WHT MICROPORE (GAUZE/BANDAGES/DRESSINGS) ×2 IMPLANT
TOWEL OR 17X24 6PK STRL BLUE (TOWEL DISPOSABLE) ×4 IMPLANT
TOWEL OR 17X26 10 PK STRL BLUE (TOWEL DISPOSABLE) ×4 IMPLANT
TRAY FOLEY IC TEMP SENS 16FR (CATHETERS) ×2 IMPLANT
TUBING INSUFFLATION (TUBING) ×2 IMPLANT
UNDERPAD 30X30 INCONTINENT (UNDERPADS AND DIAPERS) ×2 IMPLANT
WATER STERILE IRR 1000ML POUR (IV SOLUTION) ×4 IMPLANT

## 2015-09-22 NOTE — Anesthesia Preprocedure Evaluation (Signed)
Anesthesia Evaluation  Patient identified by MRN, date of birth, ID band Patient awake    Reviewed: Allergy & Precautions, NPO status , Patient's Chart, lab work & pertinent test results  Airway Mallampati: I  TM Distance: >3 FB     Dental   Pulmonary former smoker,    Pulmonary exam normal        Cardiovascular hypertension, + angina + CAD and + Peripheral Vascular Disease  Normal cardiovascular exam     Neuro/Psych Anxiety Depression  Neuromuscular disease CVA    GI/Hepatic GERD  ,  Endo/Other  diabetes, Type 2  Renal/GU Renal disease     Musculoskeletal  (+) Arthritis , Fibromyalgia -  Abdominal   Peds  Hematology  (+) anemia ,   Anesthesia Other Findings Blind  Reproductive/Obstetrics                             Anesthesia Physical Anesthesia Plan  ASA: III  Anesthesia Plan: General   Post-op Pain Management:    Induction: Intravenous  Airway Management Planned: Oral ETT  Additional Equipment: Arterial line, CVP, PA Cath and TEE  Intra-op Plan:   Post-operative Plan: Post-operative intubation/ventilation  Informed Consent: I have reviewed the patients History and Physical, chart, labs and discussed the procedure including the risks, benefits and alternatives for the proposed anesthesia with the patient or authorized representative who has indicated his/her understanding and acceptance.     Plan Discussed with: CRNA, Anesthesiologist and Surgeon  Anesthesia Plan Comments:         Anesthesia Quick Evaluation

## 2015-09-22 NOTE — Brief Op Note (Addendum)
09/22/2015  12:10 PM      [address]       [place] [address]             [phone]     09/18/2015 - 09/22/2015  12:10 PM  PATIENT:  [name]  70 y.o. 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   LIMA-LAD; SEQ SVG-DIAG-OM; SVG-PD  SURGEON:  Surgeon(s): [name], MD  PHYSICIAN ASSISTANT: WAYNE GOLD PA-C  ANESTHESIA:   general  PATIENT CONDITION:  ICU - intubated and hemodynamically stable.  PRE-OPERATIVE WEIGHT: 0000000  COMPLICATIONS: NO KNOWN

## 2015-09-22 NOTE — Progress Notes (Signed)
  Echocardiogram Echocardiogram Transesophageal has been performed.  [name] 09/22/2015, 7:59 AM

## 2015-09-22 NOTE — Progress Notes (Signed)
Pt now in ICU under Dr. Everrett Coombe service. Will be available if needed.   Thanks. Doree Barthel, MD [facility] [email or web address] password [facility]

## 2015-09-22 NOTE — Anesthesia Postprocedure Evaluation (Signed)
Anesthesia Post Note  Patient: [name]  Procedure(s) Performed: Procedure(s) (LRB): CORONARY ARTERY BYPASS GRAFTING (CABG) times four using the right greater saphenous vein harvested endoscopically and the left internal mammary artery.  LIMA-LAD, SEQ SVG-DIAG & OM, SVG-PD. (N/A)  Patient location during evaluation: SICU Anesthesia Type: General Level of consciousness: patient remains intubated per anesthesia plan and sedated Pain management: pain level controlled Vital Signs Assessment: post-procedure vital signs reviewed and stable Respiratory status: patient on ventilator - see flowsheet for VS and patient remains intubated per anesthesia plan Cardiovascular status: stable and blood pressure returned to baseline Anesthetic complications: no    Last Vitals:  Filed Vitals:   09/22/15 0400 09/22/15 0558  BP: 131/52 101/60  Pulse: 66 65  Temp: 36.6 C   Resp:      Last Pain:  Filed Vitals:   09/22/15 0559  PainSc: 0-No pain                 Brizeida Mcmurry EDWARD

## 2015-09-22 NOTE — Progress Notes (Signed)
Increased RR 14 due to abg

## 2015-09-22 NOTE — Progress Notes (Signed)
Report received via [place] RN in patient's room using [employer], reviewed orders, labs, VS, meds and patient's general condition, assumed care of patient. Pt is on a Heparin and NTG drip and dose/rates verified as per policy, will give his Chlorehexadine bath later this evening and explained that to patient and his son. Patient is a little anxious at this time, will see if Lovell Sheehan the NT can do his bath a little early so that he can have his sleeping pill and go to sleep, will continue to monitor.

## 2015-09-22 NOTE — Anesthesia Procedure Notes (Signed)
Procedure Name: Intubation Date/Time: 09/22/2015 7:43 AM Performed by: Salli Quarry Feiga Nadel Pre-anesthesia Checklist: Patient identified, Emergency Drugs available, Suction available and Patient being monitored Patient Re-evaluated:Patient Re-evaluated prior to inductionOxygen Delivery Method: Circle system utilized Preoxygenation: Pre-oxygenation with 100% oxygen Intubation Type: IV induction Ventilation: Mask ventilation without difficulty Laryngoscope Size: Mac and 4 Grade View: Grade I Tube type: Oral Tube size: 8.5 mm Number of attempts: 1 Airway Equipment and Method: Stylet Placement Confirmation: ETT inserted through vocal cords under direct vision,  positive ETCO2 and breath sounds checked- equal and bilateral Secured at: 23 cm Tube secured with: Tape Dental Injury: Teeth and Oropharynx as per pre-operative assessment

## 2015-09-22 NOTE — Progress Notes (Signed)
Patient is sleeping but all food/drink off his bedside stand since he is now NPO for O/R in the AM, no other changes at this time. Patient is sleeping with no s/s of discomfort or distress noted, VSS.

## 2015-09-22 NOTE — Progress Notes (Signed)
I reviewed the patient's CT scan from 2012 recent cardiac cath. Discussed this with the patient Dr. Oneida Alar and Dr. Servando Snare. 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 This was present in the CT scan in 2012. There was contrast injected through the right arm making a great deal of opacification of the venous structures at this area as well. It does appear though that his common carotid arteries widely patent. I feel that this would add significant magnitude for correction of this and he is asymptomatic. He specifically denies any difficulty in his right arm versus his left arm. He reports that both arms become fatigued held over his head for any period of time. I would recommend continued up observe this asymptomatic proximal cauliflower plaque in his optimal right subclavian artery. He does have retrograde flow in his right vertebral artery with well vascularized right upper extremity. Patient understands and wished to proceed with coronary artery bypass grafting with Dr. Servando Snare as planned

## 2015-09-22 NOTE — OR Nursing (Signed)
Pt's top and bottom dentures placed in bag with pt label and given to pt RN in 2S08.

## 2015-09-22 NOTE — Progress Notes (Signed)
Patient shaved from neck to toes and chlorahexadine bath done and linens changed, new gown placed and patient's underwear removed. BS 124, patient weighed and Resp. Therapy is up to do his PFT study, all labs completed, urine results in computer, sacral dressing applied, consents signed and patient is ready for surgery, will have him void prior to leaving this am, no c/o pain or discomfort at this time.

## 2015-09-22 NOTE — Progress Notes (Signed)
[facility] BRIEF SICU PROGRESS NOTE  Day of Surgery  S/P Procedure(s) (LRB): CORONARY ARTERY BYPASS GRAFTING (CABG) times four using the right greater saphenous vein harvested endoscopically and the left internal mammary artery.  LIMA-LAD, SEQ SVG-DIAG & OM, SVG-PD. (N/A)   Extubated uneventfully Neuro grossly intact but agitated and asking for narcotics NSR w/ stable hemodynamics Chest tube output low UOP adequate Labs okay  Plan: Continue routine early postop.  D/C femoral a-line and start fentanyl patch  Rexene Alberts, MD 09/22/2015 7:26 PM

## 2015-09-22 NOTE — Transfer of Care (Signed)
Immediate Anesthesia Transfer of Care Note  Patient: [name]  Procedure(s) Performed: Procedure(s): CORONARY ARTERY BYPASS GRAFTING (CABG) times four using the right greater saphenous vein harvested endoscopically and the left internal mammary artery.  LIMA-LAD, SEQ SVG-DIAG & OM, SVG-PD. (N/A)  Patient Location: PACU  Anesthesia Type:General  Level of Consciousness: Patient remains intubated per anesthesia plan  Airway & Oxygen Therapy: Patient remains intubated per anesthesia plan and Patient placed on Ventilator (see vital sign flow sheet for setting)  Post-op Assessment: Report given to RN and Post -op Vital signs reviewed and stable  Post vital signs: Reviewed and stable  Last Vitals:  Filed Vitals:   09/22/15 0400 09/22/15 0558  BP: 131/52 101/60  Pulse: 66 65  Temp: 36.6 C   Resp:      Complications: No apparent anesthesia complications

## 2015-09-22 NOTE — Plan of Care (Signed)
Problem: Consults Goal: Chest Pain Patient Education (See Patient Education module for education specifics.)  Outcome: Progressing Patient refused to watch video and has the booklet but refused to read it, reviewed verbally some of the material and patient verbalized understanding

## 2015-09-22 NOTE — Progress Notes (Signed)
Patient to the O/R with belongings via stretcher with nurse, son instructed on where to stay and what will happen throughout the morning, left patient in care of pre-op staff.

## 2015-09-22 NOTE — Procedures (Signed)
Extubation Procedure Note  Patient Details:   Name: [name] DOB: 1946/04/15 MRN: [id number]   Airway Documentation:     Evaluation  O2 sats: stable throughout Complications: No apparent complications Patient did tolerate procedure well. Bilateral Breath Sounds: Clear   Yes   Pt. Was extubated to a 4L [place] without any complications, dyspnea or stridor noted. Pt. Achieved a gaol of 1,050 on VC & -20 on NIF. Pt. Was instructed on IS x 5, highest goal reached was 640mL.   Antoino Westhoff, Eddie North 09/22/2015, 5:59, PM

## 2015-09-23 ENCOUNTER — Encounter (HOSPITAL_COMMUNITY): Payer: Self-pay | Admitting: Cardiothoracic Surgery

## 2015-09-23 ENCOUNTER — Inpatient Hospital Stay (HOSPITAL_COMMUNITY): Payer: Medicare Other

## 2015-09-23 LAB — CREATININE, SERUM
Creatinine, Ser: 0.71 mg/dL (ref 0.61–1.24)
GFR calc Af Amer: >60 mL/min (ref >60)

## 2015-09-23 LAB — BASIC METABOLIC PANEL
Anion gap: 9 (ref 5–15)
BUN: 11 mg/dL (ref 6–20)
CALCIUM: 7.8 mg/dL — AB (ref 8.9–10.3)
CHLORIDE: 101 mmol/L (ref 101–111)
CO2: 23 mmol/L (ref 22–32)
CREATININE: 0.64 mg/dL (ref 0.61–1.24)
GFR calc Af Amer: >60 mL/min (ref >60)
GFR calc non Af Amer: >60 mL/min (ref >60)
GLUCOSE: 123 mg/dL — AB (ref 65–99)
Potassium: 3.6 mmol/L (ref 3.5–5.1)
Sodium: 133 mmol/L — ABNORMAL LOW (ref 135–145)

## 2015-09-23 LAB — CBC
HEMATOCRIT: 33.3 % — AB (ref 39.0–52.0)
HEMATOCRIT: 32.5 % — AB (ref 39.0–52.0)
HEMOGLOBIN: 10.9 g/dL — AB (ref 13.0–17.0)
HEMOGLOBIN: 10.6 g/dL — AB (ref 13.0–17.0)
MCH: 28 pg (ref 26.0–34.0)
MCH: 28.1 pg (ref 26.0–34.0)
MCHC: 32.7 g/dL (ref 30.0–36.0)
MCHC: 32.6 g/dL (ref 30.0–36.0)
MCV: 85.6 fL (ref 78.0–100.0)
MCV: 86.2 fL (ref 78.0–100.0)
Platelets: 178 10*3/uL (ref 150–400)
Platelets: 170 10*3/uL (ref 150–400)
RBC: 3.89 MIL/uL — ABNORMAL LOW (ref 4.22–5.81)
RBC: 3.77 MIL/uL — ABNORMAL LOW (ref 4.22–5.81)
RDW: 14 % (ref 11.5–15.5)
RDW: 14.1 % (ref 11.5–15.5)
WBC: 11.9 10*3/uL — ABNORMAL HIGH (ref 4.0–10.5)
WBC: 13.1 10*3/uL — ABNORMAL HIGH (ref 4.0–10.5)

## 2015-09-23 LAB — GLUCOSE, CAPILLARY
GLUCOSE-CAPILLARY: 113 mg/dL — AB (ref 65–99)
GLUCOSE-CAPILLARY: 114 mg/dL — AB (ref 65–99)
GLUCOSE-CAPILLARY: 115 mg/dL — AB (ref 65–99)
GLUCOSE-CAPILLARY: 117 mg/dL — AB (ref 65–99)
GLUCOSE-CAPILLARY: 117 mg/dL — AB (ref 65–99)
GLUCOSE-CAPILLARY: 118 mg/dL — AB (ref 65–99)
GLUCOSE-CAPILLARY: 119 mg/dL — AB (ref 65–99)
GLUCOSE-CAPILLARY: 128 mg/dL — AB (ref 65–99)
GLUCOSE-CAPILLARY: 187 mg/dL — AB (ref 65–99)
Glucose-Capillary: 107 mg/dL — ABNORMAL HIGH (ref 65–99)
Glucose-Capillary: 108 mg/dL — ABNORMAL HIGH (ref 65–99)
Glucose-Capillary: 110 mg/dL — ABNORMAL HIGH (ref 65–99)
Glucose-Capillary: 115 mg/dL — ABNORMAL HIGH (ref 65–99)
Glucose-Capillary: 117 mg/dL — ABNORMAL HIGH (ref 65–99)
Glucose-Capillary: 117 mg/dL — ABNORMAL HIGH (ref 65–99)
Glucose-Capillary: 120 mg/dL — ABNORMAL HIGH (ref 65–99)
Glucose-Capillary: 124 mg/dL — ABNORMAL HIGH (ref 65–99)
Glucose-Capillary: 131 mg/dL — ABNORMAL HIGH (ref 65–99)
Glucose-Capillary: 132 mg/dL — ABNORMAL HIGH (ref 65–99)
Glucose-Capillary: 148 mg/dL — ABNORMAL HIGH (ref 65–99)
Glucose-Capillary: 162 mg/dL — ABNORMAL HIGH (ref 65–99)
Glucose-Capillary: 87 mg/dL (ref 65–99)
Glucose-Capillary: 88 mg/dL (ref 65–99)

## 2015-09-23 LAB — POCT I-STAT, CHEM 8
BUN: 12 mg/dL (ref 6–20)
CALCIUM ION: 1.11 mmol/L — AB (ref 1.13–1.30)
CHLORIDE: 97 mmol/L — AB (ref 101–111)
CREATININE: 0.6 mg/dL — AB (ref 0.61–1.24)
GLUCOSE: 188 mg/dL — AB (ref 65–99)
HCT: 36 % — ABNORMAL LOW (ref 39.0–52.0)
Hemoglobin: 12.2 g/dL — ABNORMAL LOW (ref 13.0–17.0)
Potassium: 3.9 mmol/L (ref 3.5–5.1)
Sodium: 135 mmol/L (ref 135–145)
TCO2: 22 mmol/L (ref 0–100)

## 2015-09-23 LAB — MAGNESIUM
MAGNESIUM: 2 mg/dL (ref 1.7–2.4)
Magnesium: 2.1 mg/dL (ref 1.7–2.4)

## 2015-09-23 MED ORDER — INSULIN DETEMIR 100 UNIT/ML ~~LOC~~ SOLN
15.0000 [IU] | Freq: Once | SUBCUTANEOUS | Status: AC
  Administered 2015-09-23: 15 [IU] via SUBCUTANEOUS
  Filled 2015-09-23: qty 0.15

## 2015-09-23 MED ORDER — MUPIROCIN 2 % EX OINT
1.0000 "application " | TOPICAL_OINTMENT | Freq: Two times a day (BID) | CUTANEOUS | Status: AC
  Administered 2015-09-23 – 2015-09-28 (×10): 1 via NASAL
  Filled 2015-09-23 (×5): qty 22

## 2015-09-23 MED ORDER — INSULIN ASPART 100 UNIT/ML ~~LOC~~ SOLN
0.0000 [IU] | SUBCUTANEOUS | Status: DC
  Administered 2015-09-23 (×2): 4 [IU] via SUBCUTANEOUS
  Administered 2015-09-24 (×2): 2 [IU] via SUBCUTANEOUS

## 2015-09-23 MED ORDER — INSULIN DETEMIR 100 UNIT/ML ~~LOC~~ SOLN
15.0000 [IU] | Freq: Every day | SUBCUTANEOUS | Status: DC
  Administered 2015-09-24 – 2015-09-29 (×6): 15 [IU] via SUBCUTANEOUS
  Filled 2015-09-23 (×6): qty 0.15

## 2015-09-23 MED ORDER — POTASSIUM CHLORIDE 10 MEQ/50ML IV SOLN
10.0000 meq | INTRAVENOUS | Status: AC
  Administered 2015-09-23 (×3): 10 meq via INTRAVENOUS
  Filled 2015-09-23 (×3): qty 50

## 2015-09-23 MED ORDER — CHLORHEXIDINE GLUCONATE CLOTH 2 % EX PADS
6.0000 | MEDICATED_PAD | Freq: Every day | CUTANEOUS | Status: DC
  Administered 2015-09-23: 6 via TOPICAL

## 2015-09-23 MED ORDER — FENTANYL 50 MCG/HR TD PT72
100.0000 ug | MEDICATED_PATCH | TRANSDERMAL | Status: DC
  Administered 2015-09-23 – 2015-09-29 (×3): 100 ug via TRANSDERMAL
  Filled 2015-09-23 (×2): qty 2
  Filled 2015-09-23: qty 1

## 2015-09-23 MED ORDER — ENOXAPARIN SODIUM 30 MG/0.3ML ~~LOC~~ SOLN
30.0000 mg | Freq: Every day | SUBCUTANEOUS | Status: DC
Start: 2015-09-23 — End: 2015-09-29
  Administered 2015-09-23 – 2015-09-28 (×6): 30 mg via SUBCUTANEOUS
  Filled 2015-09-23 (×6): qty 0.3

## 2015-09-23 MED ORDER — FENTANYL 25 MCG/HR TD PT72: 100.0000 ug | MEDICATED_PATCH | TRANSDERMAL | Status: DC

## 2015-09-23 MED FILL — Heparin Sodium (Porcine) Inj 1000 Unit/ML: INTRAMUSCULAR | Qty: 30 | Status: AC

## 2015-09-23 MED FILL — Sodium Chloride IV Soln 0.9%: INTRAVENOUS | Qty: 2000 | Status: AC

## 2015-09-23 MED FILL — Potassium Chloride Inj 2 mEq/ML: INTRAVENOUS | Qty: 40 | Status: AC

## 2015-09-23 MED FILL — Electrolyte-R (PH 7.4) Solution: INTRAVENOUS | Qty: 3000 | Status: AC

## 2015-09-23 MED FILL — Heparin Sodium (Porcine) Inj 1000 Unit/ML: INTRAMUSCULAR | Qty: 10 | Status: AC

## 2015-09-23 MED FILL — Magnesium Sulfate Inj 50%: INTRAMUSCULAR | Qty: 10 | Status: AC

## 2015-09-23 MED FILL — Mannitol IV Soln 20%: INTRAVENOUS | Qty: 500 | Status: AC

## 2015-09-23 MED FILL — Sodium Bicarbonate IV Soln 8.4%: INTRAVENOUS | Qty: 50 | Status: AC

## 2015-09-23 MED FILL — Lidocaine HCl IV Inj 20 MG/ML: INTRAVENOUS | Qty: 5 | Status: AC

## 2015-09-23 NOTE — Progress Notes (Signed)
Patient refusing nurse to draw blood glucose, also refusing chest xray

## 2015-09-23 NOTE — Progress Notes (Signed)
Patient not cooperating regarding post op orders. Nurse explained importance of not using arms but patient continues to use them. Nurse explained to patient importance of incentive spirometer, patient refusing to use. Nurse also explained importance of not pulling on any IVs or other tubing but patient continues to do so.   Patient concerned about dosage of Fentanyl patch. Will continue to monitor.

## 2015-09-23 NOTE — Progress Notes (Signed)
CT surgery p.m. Rounds  Patient's mental status has been improved this evening P.m. labs reviewed and are satisfactory Stable hemodynamics and cardiac rhythm Resting comfortably

## 2015-09-23 NOTE — Progress Notes (Signed)
Patient ID: [name], male   DOB: 20-Jul-1946, 70 y.o.   MRN: [id number] TCTS DAILY ICU PROGRESS NOTE                   [address]            [place] [address]          [phone]   1 Day Post-Op Procedure(s) (LRB): CORONARY ARTERY BYPASS GRAFTING (CABG) times four using the right greater saphenous vein harvested endoscopically and the left internal mammary artery.  LIMA-LAD, SEQ SVG-DIAG & OM, SVG-PD. (N/A)  Total Length of Stay:  LOS: 3 days   Subjective: Refusing care dusring night Objective: Vital signs in last 24 hours: Temp:  [97.7 F (36.5 C)-101.3 F (38.5 C)] 100.4 F (38 C) (02/07 0800) Pulse Rate:  [31-102] 73 (02/07 0800) Cardiac Rhythm:  [-] A-V Sequential paced (02/07 0400) Resp:  [11-29] 15 (02/07 0800) BP: (88-156)/(56-84) 138/68 mmHg (02/07 0747) SpO2:  [85 %-100 %] 97 % (02/07 0800) Arterial Line BP: (91-168)/(50-77) 135/64 mmHg (02/07 0800) FiO2 (%):  [40 %-50 %] 40 % (02/06 1717) Weight:  [222 lb 10.6 oz (101 kg)] 222 lb 10.6 oz (101 kg) (02/07 0500)  Filed Weights   09/21/15 0430 09/22/15 0400 09/23/15 0500  Weight: 205 lb 6.4 oz (93.169 kg) 202 lb 8 oz (91.853 kg) 222 lb 10.6 oz (101 kg)    Weight change: 20 lb 2.6 oz (9.147 kg)   Hemodynamic parameters for last 24 hours: PAP: (20-42)/(5-27) 26/16 mmHg CO:  [4.5 L/min-5.8 L/min] 4.5 L/min CI:  [2.1 L/min/m2-3.3 L/min/m2] 2.1 L/min/m2  Intake/Output from previous day: 02/06 0701 - 02/07 0700 In: 5738.1 [I.V.:4158.1; Blood:850; NG/GT:30; IV Piggyback:700] Out: [id number] [Urine:3530; Blood:1625; Chest Tube:305]  Intake/Output this shift:    Current Meds: Scheduled Meds: . acetaminophen  1,000 mg Oral 4 times per day   Or  . acetaminophen (TYLENOL) oral liquid 160 mg/5 mL  1,000 mg Per Tube 4 times per day  . aspirin EC  325 mg Oral Daily   Or  . aspirin  324 mg Per Tube Daily  . atorvastatin  80 mg Oral Q0600  . bisacodyl  10 mg Oral Daily   Or  . bisacodyl  10 mg Rectal  Daily  . buPROPion  300 mg Oral BH-q7a  . cefUROXime (ZINACEF)  IV  1.5 g Intravenous Q12H  . docusate sodium  200 mg Oral Daily  . famotidine (PEPCID) IV  20 mg Intravenous Q12H  . fentaNYL  25 mcg Transdermal Q72H  . insulin regular  0-10 Units Intravenous TID WC  . metoprolol tartrate  12.5 mg Oral BID   Or  . metoprolol tartrate  12.5 mg Per Tube BID  . [START ON 09/24/2015] pantoprazole  40 mg Oral Daily  . sodium chloride flush  3 mL Intravenous Q12H   Continuous Infusions: . sodium chloride 10 mL/hr at 09/23/15 0700  . sodium chloride    . sodium chloride 20 mL/hr at 09/23/15 0700  . dexmedetomidine Stopped (09/22/15 1800)  . insulin (NOVOLIN-R) infusion 2.2 Units/hr (09/23/15 0700)  . lactated ringers Stopped (09/23/15 0300)  . lactated ringers 10 mL/hr at 09/23/15 0700  . nitroGLYCERIN 80 mcg/min (09/23/15 0700)  . phenylephrine (NEO-SYNEPHRINE) Adult infusion Stopped (09/22/15 1700)   PRN Meds:.sodium chloride, albumin human, lactated ringers, metoprolol, midazolam, morphine injection, ondansetron (ZOFRAN) IV, oxyCODONE, sodium chloride flush, traMADol  General appearance: alert and uncooperative Neurologic: intact Heart: regular rate and rhythm,  S1, S2 normal, no murmur, click, rub or gallop Lungs: diminished breath sounds bibasilar Abdomen: soft, non-tender; bowel sounds normal; no masses,  no organomegaly Extremities: extremities normal, atraumatic, no cyanosis or edema and Homans sign is negative, no sign of DVT Wound: dressing intact  Lab Results: CBC: Recent Labs  09/22/15 2000 09/22/15 2050 09/23/15 0340  WBC 9.1  --  11.9*  HGB 11.2* 11.9* 10.9*  HCT 34.1* 35.0* 33.3*  PLT 167  --  178   BMET:  Recent Labs  09/22/15 0550  09/22/15 2050 09/23/15 0340  NA 132*  < > 137 133*  K 4.0  < > 3.6 3.6  CL 96*  < > 102 101  CO2 26  --   --  23  GLUCOSE 138*  < > 152* 123*  BUN 14  < > 11 11  CREATININE 0.80  < > 0.50* 0.64  CALCIUM 9.2  --   --  7.8*    < > = values in this interval not displayed.  PT/INR:  Recent Labs  09/22/15 1350  LABPROT 16.9*  INR 1.37   Radiology: Dg Chest Port 1 View  09/22/2015  CLINICAL DATA:  Status post CABG EXAM: PORTABLE CHEST 1 VIEW COMPARISON:  09/21/2015 FINDINGS: Endotracheal tube tip about 4 cm above the carina. Left internal jugular central line tip extends into the right ventricle. NG tube crosses the gastroesophageal junction. There is mediastinal drain and a left chest tube. Stable mild enlargement of cardiac silhouette. No evidence of pneumothorax or significant pleural effusion. Mild bibasilar atelectasis with no evidence of pulmonary edema. IMPRESSION: Acceptable postoperative appearance with lines and tubes as described Electronically Signed   By: Skipper Cliche M.D.   On: 09/22/2015 14:01     Assessment/Plan: S/P Procedure(s) (LRB): CORONARY ARTERY BYPASS GRAFTING (CABG) times four using the right greater saphenous vein harvested endoscopically and the left internal mammary artery.  LIMA-LAD, SEQ SVG-DIAG & OM, SVG-PD. (N/A) Mobilize Diuresis d/c tubes/lines Continue foley due to strict I&O and patient critically ill See progression orders Likely narcotic addiction, increase patch to pre op dose with some withdrawel  [name] 09/23/2015 8:00 AM

## 2015-09-23 NOTE — Progress Notes (Signed)
Patient agreed to chest x-ray and EKG; both have been completed.  Discussed with patient the need to cooperate with nursing and hospital staff in order to care for him.  Also reinforced need to use pillow and not arms to move around and the importance of incentive spriometer use, pt continues to not follow sternal precautions or use of IS.  Patient resting quietly VSS will continue to monitor.

## 2015-09-24 ENCOUNTER — Inpatient Hospital Stay (HOSPITAL_COMMUNITY): Payer: Medicare Other

## 2015-09-24 LAB — BASIC METABOLIC PANEL
Anion gap: 6 (ref 5–15)
BUN: 12 mg/dL (ref 6–20)
CO2: 25 mmol/L (ref 22–32)
Calcium: 7.8 mg/dL — ABNORMAL LOW (ref 8.9–10.3)
Chloride: 101 mmol/L (ref 101–111)
Creatinine, Ser: 0.64 mg/dL (ref 0.61–1.24)
GFR calc Af Amer: >60 mL/min (ref >60)
GFR calc non Af Amer: >60 mL/min (ref >60)
Glucose, Bld: 116 mg/dL — ABNORMAL HIGH (ref 65–99)
Potassium: 3.9 mmol/L (ref 3.5–5.1)
Sodium: 132 mmol/L — ABNORMAL LOW (ref 135–145)

## 2015-09-24 LAB — CBC
HCT: 31 % — ABNORMAL LOW (ref 39.0–52.0)
Hemoglobin: 9.8 g/dL — ABNORMAL LOW (ref 13.0–17.0)
MCH: 27.5 pg (ref 26.0–34.0)
MCHC: 31.6 g/dL (ref 30.0–36.0)
MCV: 87.1 fL (ref 78.0–100.0)
Platelets: 175 10*3/uL (ref 150–400)
RBC: 3.56 MIL/uL — ABNORMAL LOW (ref 4.22–5.81)
RDW: 14.2 % (ref 11.5–15.5)
WBC: 10.9 10*3/uL — ABNORMAL HIGH (ref 4.0–10.5)

## 2015-09-24 LAB — GLUCOSE, CAPILLARY
GLUCOSE-CAPILLARY: 115 mg/dL — AB (ref 65–99)
GLUCOSE-CAPILLARY: 140 mg/dL — AB (ref 65–99)
GLUCOSE-CAPILLARY: 106 mg/dL — AB (ref 65–99)
Glucose-Capillary: 134 mg/dL — ABNORMAL HIGH (ref 65–99)
Glucose-Capillary: 117 mg/dL — ABNORMAL HIGH (ref 65–99)

## 2015-09-24 MED ORDER — GLUCERNA SHAKE PO LIQD
237.0000 mL | Freq: Three times a day (TID) | ORAL | Status: DC
  Administered 2015-09-25 – 2015-09-29 (×5): 237 mL via ORAL

## 2015-09-24 MED ORDER — BISACODYL 5 MG PO TBEC
10.0000 mg | DELAYED_RELEASE_TABLET | Freq: Every day | ORAL | Status: DC | PRN
  Administered 2015-09-24 – 2015-09-25 (×2): 10 mg via ORAL
  Filled 2015-09-24 (×2): qty 2

## 2015-09-24 MED ORDER — INSULIN ASPART 100 UNIT/ML ~~LOC~~ SOLN
0.0000 [IU] | Freq: Three times a day (TID) | SUBCUTANEOUS | Status: DC
  Administered 2015-09-24 – 2015-09-25 (×2): 2 [IU] via SUBCUTANEOUS
  Administered 2015-09-25: 4 [IU] via SUBCUTANEOUS
  Administered 2015-09-25 – 2015-09-27 (×7): 2 [IU] via SUBCUTANEOUS
  Administered 2015-09-27: 4 [IU] via SUBCUTANEOUS
  Administered 2015-09-28 (×2): 2 [IU] via SUBCUTANEOUS

## 2015-09-24 MED ORDER — OXYCODONE HCL 5 MG PO TABS
5.0000 mg | ORAL_TABLET | ORAL | Status: DC | PRN
  Administered 2015-09-24 – 2015-09-29 (×26): 10 mg via ORAL
  Filled 2015-09-24 (×25): qty 2

## 2015-09-24 MED ORDER — TAMSULOSIN HCL 0.4 MG PO CAPS
0.4000 mg | ORAL_CAPSULE | Freq: Two times a day (BID) | ORAL | Status: DC
  Administered 2015-09-24 – 2015-09-29 (×11): 0.4 mg via ORAL
  Filled 2015-09-24 (×11): qty 1

## 2015-09-24 MED ORDER — SODIUM CHLORIDE 0.9% FLUSH
3.0000 mL | Freq: Two times a day (BID) | INTRAVENOUS | Status: DC
  Administered 2015-09-24 – 2015-09-28 (×9): 3 mL via INTRAVENOUS

## 2015-09-24 MED ORDER — MOVING RIGHT ALONG BOOK
Freq: Once | Status: AC
  Administered 2015-09-24: 09:00:00
  Filled 2015-09-24: qty 1

## 2015-09-24 MED ORDER — CLONAZEPAM 1 MG PO TABS
1.0000 mg | ORAL_TABLET | Freq: Three times a day (TID) | ORAL | Status: DC
  Administered 2015-09-24 – 2015-09-29 (×16): 1 mg via ORAL
  Filled 2015-09-24 (×16): qty 1

## 2015-09-24 MED ORDER — TRAMADOL HCL 50 MG PO TABS
50.0000 mg | ORAL_TABLET | ORAL | Status: DC | PRN
  Administered 2015-09-24 – 2015-09-28 (×4): 100 mg via ORAL
  Filled 2015-09-24 (×4): qty 2

## 2015-09-24 MED ORDER — DOCUSATE SODIUM 100 MG PO CAPS
200.0000 mg | ORAL_CAPSULE | Freq: Every day | ORAL | Status: DC
  Administered 2015-09-24 – 2015-09-29 (×6): 200 mg via ORAL
  Filled 2015-09-24 (×6): qty 2

## 2015-09-24 MED ORDER — SODIUM CHLORIDE 0.9 % IV SOLN: 250.0000 mL | INTRAVENOUS | Status: DC | PRN

## 2015-09-24 MED ORDER — LINAGLIPTIN 5 MG PO TABS
5.0000 mg | ORAL_TABLET | Freq: Every day | ORAL | Status: DC
  Administered 2015-09-24 – 2015-09-29 (×6): 5 mg via ORAL
  Filled 2015-09-24 (×7): qty 1

## 2015-09-24 MED ORDER — CARVEDILOL 6.25 MG PO TABS
6.2500 mg | ORAL_TABLET | Freq: Every day | ORAL | Status: DC
  Administered 2015-09-24 – 2015-09-29 (×6): 6.25 mg via ORAL
  Filled 2015-09-24 (×6): qty 1

## 2015-09-24 MED ORDER — PANTOPRAZOLE SODIUM 40 MG PO TBEC
40.0000 mg | DELAYED_RELEASE_TABLET | Freq: Every day | ORAL | Status: DC
  Administered 2015-09-24 – 2015-09-29 (×6): 40 mg via ORAL
  Filled 2015-09-24 (×6): qty 1

## 2015-09-24 MED ORDER — ONDANSETRON HCL 4 MG/2ML IJ SOLN: 4.0000 mg | Freq: Four times a day (QID) | INTRAMUSCULAR | Status: DC | PRN

## 2015-09-24 MED ORDER — BISACODYL 10 MG RE SUPP: 10.0000 mg | Freq: Every day | RECTAL | Status: DC | PRN

## 2015-09-24 MED ORDER — CLONAZEPAM 1 MG PO TABS: 1.0000 mg | ORAL_TABLET | Freq: Three times a day (TID) | ORAL | Status: DC

## 2015-09-24 MED ORDER — SODIUM CHLORIDE 0.9% FLUSH: 3.0000 mL | INTRAVENOUS | Status: DC | PRN

## 2015-09-24 MED ORDER — ONDANSETRON HCL 4 MG PO TABS: 4.0000 mg | ORAL_TABLET | Freq: Four times a day (QID) | ORAL | Status: DC | PRN

## 2015-09-24 MED ORDER — ASPIRIN EC 325 MG PO TBEC
325.0000 mg | DELAYED_RELEASE_TABLET | Freq: Every day | ORAL | Status: DC
  Administered 2015-09-24 – 2015-09-29 (×6): 325 mg via ORAL
  Filled 2015-09-24 (×6): qty 1

## 2015-09-24 NOTE — Op Note (Signed)
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  SURGICAL PROCEDURE:  Coronary artery bypass grafting x4 with left internal mammary to the left anterior descending coronary artery, sequential reverse saphenous vein graft to the highest diagonal and first obtuse marginal, reverse saphenous vein graft to the posterior descending with right thigh greater saphenous endo vein harvesting.  SURGEON:  Lanelle Bal, MD.  FIRST ASSISTANT:  John Giovanni, PA-C.  BRIEF HISTORY:  The patient is a 70 year old male with known significant vascular disease, having had left carotid endarterectomy and aorto-bifem done in the past.  He has also had coronary stents placed 8-10 years ago by Dr. Einar Gip.  The patient now presents with new onset of chest pain, underwent a cardiac catheterization, which demonstrated high-grade stenosis diffusely in the proximal right coronary artery, distal left main with proximal LAD lesion, all of greater than 80%.  Overall, ventricular function was preserved.  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  Consideration for this was reviewed with the vascular surgeons both Dr. Carloyn Manner and Dr. Oneida Alar and the consensus was that it was distal to the takeoff of the carotid artery and was elected not to treat simultaneously with sternotomy especially with the patient's urgent need for surgery with his left main disease and episodic pain preoperatively.  The patient also previously had  treatment for varicose veins with laser ablation of the left greater saphenous vein and the right lesser saphenous vein.  Vein mapping was done preoperatively. Risks and options were discussed with the patient in detail.  He agreed and signed informed consent.  It should be noted that preoperatively the patient has had significant problems with narcotic addiction and is currently under constant pain management [facility] [place] and uses 100 mcg fentanyl patches on a regular basis.  DESCRIPTION OF PROCEDURE:  With Swan-Ganz and arterial line monitors in place, the patient underwent general endotracheal anesthesia without incident.  Skin of chest and legs were prepped with Betadine and draped in usual sterile manner.  Using the preop vein mapping, we isolated a segment of right greater saphenous vein at the right knee and endoscopically harvested from the right thigh.  The vein was removed and was of good quality and caliber.  Median sternotomy was performed.  Left internal mammary artery was dissected down as pedicle graft.  The distal artery was divided and had good free flow.  Pericardium was opened. Overall, ventricular function appeared preserved.  The patient was systemically heparinized.  Ascending aorta was cannulated.  Care was taken to palpate the ascending aorta and there were no significant calcifications appreciated by palpation.  The right dual-stage venous cannula was placed into the right atrium.  The patient  was then placed on cardiopulmonary bypass at 2.4 L/min/m2.  Sites of anastomosis were selected and dissected out of the epicardium.  The patient's body temperature was then cooled to 32 degrees.  Aortic crossclamp was applied and 800 mL of cold blood potassium cardioplegia was administered with diastolic arrest of the heart.  Myocardial septal temperature was monitored throughout the crossclamp.  Attention was turned first to the posterior descending  coronary artery.  The distal right coronary artery was significantly calcified.  The vessel was opened, admitted a 1.5 mm probe distally.  Using a running 7-0 Prolene, a distal anastomosis was performed with a segment of reverse saphenous vein graft.  The heart was then elevated and the very highest diagonal branch was opened and admitted a 1.5 mm probe.  Using a diamond-type, side-to-side anastomosis was carried out with a segment of reverse saphenous vein graft.  The distal extent of the same vein was then carried to the first obtuse marginal vessel which was partially intramyocardial.  This vessel was opened, admitted a 1.5-mm probe distally.  Using a running 7-0 Prolene, distal anastomosis was performed.  Additional cold blood cardioplegia was administered down the vein grafts.  The left internal mammary artery was then trimmed to appropriate length.  The left anterior descending coronary artery was opened between the mid and distal third, 1.5 mm probe passed distally without difficulty.  Using a running 8-0 Prolene, left internal mammary artery was anastomosed to left anterior descending coronary artery.  With the crossclamp still in place, the 2 punch aortotomies were performed and each of the 2 vein grafts were anastomosed to the ascending aorta.  The heart was allowed to passively fill and de-air.  The bulldog was removed from the mammary artery with prompt rise in myocardial septal temperature.  Aortic cross-clamp was then removed with total cross-clamp time of 86 minutes.  Sites of anastomosis were inspected and were free of bleeding.  The patient did require electrical defibrillation to return to a sinus rhythm.  Sites of anastomosis as noted were free of bleeding.  Atrial and ventricular pacing wires were applied.  Graft marker was applied.  The patient was then ventilated and weaned from cardiopulmonary bypass without difficulty.  Post bypass, he continued to have a trace of  mitral regurgitation and trace of aortic regurgitation as he did preoperatively.  LV function was good.  A left pleural tube was left in place.  Blake mediastinal drain was left in place.  Pericardium was reapproximated.  Sternum was closed with #6 stainless steel wire. Fascia was closed with interrupted 0 Vicryl, running 3-0 Vicryl in subcutaneous tissue, 4-0 subcuticular stitch in skin edges.  Dry dressings were applied.  Sponge and needle count was reported as correct at the completion of the procedure.  The patient tolerated the procedure without obvious complication, was transferred to the Surgical Intensive Care Unit for further postoperative care.  Total pump time was 115 minutes.  The patient did not require any blood bank blood products during the operative procedure.     Lanelle Bal, MD     EG/MEDQ  D:  09/23/2015  T:  09/23/2015  Job:  [id number]  cc:   Laverda Page, MD

## 2015-09-24 NOTE — Progress Notes (Addendum)
Nutrition Follow Up  DOCUMENTATION CODES:   Non-severe (moderate) malnutrition in context of social or environmental circumstances  INTERVENTION:    Glucerna Shake po BID, each supplement provides 220 kcal and 10 grams of protein  NUTRITION DIAGNOSIS:   Malnutrition related to social / environmental circumstances as evidenced by mild depletion of muscle mass, mild depletion of body fat, ongoing  GOAL:   Patient will meet greater than or equal to 90% of their needs, progressing  MONITOR:   PO intake, Supplement acceptance, Labs, Weight trends, I & O's  ASSESSMENT:   70 y.o. male past medical history of diabetes mellitus type 2 with last A1c of 6.9, chronic hyponatremia, history of tobacco abuse, essential hypertension, peripheral vascular disease that comes in for typical chest pain that started about a month ago.    Patient s/p procedure 2/6: CORONARY ARTERY BYPASS GRAFTING x 4  Patient transferred form 2S-SICU to 2W-Cardiac today.  RN at bedside upon RD visit.  Did not disturb.  Currently on a Heart Healthy/Carbohydrate Modified diet.  PO intake good for lunch at 75% per flowsheet records.  Malnutrition is ongoing.  Would benefit from addition of oral nutrition supplements.  RD to order.  Diet Order:  Diet heart healthy/carb modified Room service appropriate?: Yes; Fluid consistency:: Thin  Skin:  Reviewed, no issues  Last BM:  2/1  Height:   Ht Readings from Last 1 Encounters:  09/18/15 6' (1.829 m)    Weight:   Wt Readings from Last 1 Encounters:  09/24/15 208 lb 8.9 oz (94.6 kg)    CBG (last 3)   Recent Labs  09/24/15 0357 09/24/15 0825 09/24/15 1135  GLUCAP 115* 134* 117*    Ideal Body Weight:  80.9 kg  BMI:  Body mass index is 28.28 kg/(m^2).  Estimated Nutritional Needs:   Kcal:  2050-2250  Protein:  110-125 gm  Fluid:  2-2.2 L  EDUCATION NEEDS:   No education needs identified at this time  Arthur Holms, RD, LDN Pager #:  [phone] After-Hours Pager #: [phone]

## 2015-09-24 NOTE — Progress Notes (Signed)
CARDIAC REHAB PHASE I   PRE:  Rate/Rhythm: 78 SR    BP: sitting 90/57    SaO2: 95 RA  MODE:  Ambulation: 60+150 ft   POST:  Rate/Rhythm: 86 SR    BP: sitting 112/51 after 60 ft, 89/70 after 150 ft     SaO2: 96 RA  Pt groggy but able to walk with RW. C/o dizziness after a few feet and returned to room. However BP stable. Pt able to walk 150 more feet.  C/o pain, awaiting pain meds. Return to bed. Son present, doting. Pt inspiring 500 mL on IS. Encouraged more use and x1 more walk with staff. Harlan, Harrison, ACSM 09/24/2015 2:08 PM

## 2015-09-24 NOTE — Progress Notes (Signed)
Pt arrived to unit from 2S.  Telemetry applied and CCMD notified.  Pt oriented to unit including call light and telephone.  Pt requesting to lie down at this time.  Pt states he has same chest soreness.  Will cont to monitor.

## 2015-09-24 NOTE — Care Management Important Message (Signed)
Important Message  Patient Details  Name: Cylar Lapa MRN: [id number] Date of Birth: 1946-05-28   Medicare Important Message Given:  Yes    Louanne Belton 09/24/2015, 1:37 PMImportant Message  Patient Details  Name: Pragyan Critelli MRN: [id number] Date of Birth: Oct 23, 1945   Medicare Important Message Given:  Yes    Samiya Mervin, Neal Dy 09/24/2015, 1:36 PM

## 2015-09-24 NOTE — Progress Notes (Signed)
Patient ID: [name], male   DOB: July 17, 1946, 70 y.o.   MRN: [id number] TCTS DAILY ICU PROGRESS NOTE                   [address]            [place] [address]          [phone]   2 Days Post-Op Procedure(s) (LRB): CORONARY ARTERY BYPASS GRAFTING (CABG) times four using the right greater saphenous vein harvested endoscopically and the left internal mammary artery.  LIMA-LAD, SEQ SVG-DIAG & OM, SVG-PD. (N/A)  Total Length of Stay:  LOS: 4 days   Subjective: Cooperative today, ambulated  Objective: Vital signs in last 24 hours: Temp:  [97.7 F (36.5 C)-99.7 F (37.6 C)] 98.1 F (36.7 C) (02/08 0700) Pulse Rate:  [59-78] 77 (02/08 0800) Cardiac Rhythm:  [-] Normal sinus rhythm (02/08 0800) Resp:  [12-21] 17 (02/08 0800) BP: (116-169)/(50-104) 157/60 mmHg (02/08 0800) SpO2:  [93 %-100 %] 100 % (02/08 0800) Arterial Line BP: (134)/(56) 134/56 mmHg (02/07 0900) Weight:  [208 lb 8.9 oz (94.6 kg)] 208 lb 8.9 oz (94.6 kg) (02/08 0500)  Filed Weights   09/22/15 0400 09/23/15 0500 09/24/15 0500  Weight: 202 lb 8 oz (91.853 kg) 222 lb 10.6 oz (101 kg) 208 lb 8.9 oz (94.6 kg)    Weight change: -14 lb 1.8 oz (-6.4 kg)   Hemodynamic parameters for last 24 hours:    Intake/Output from previous day: 02/07 0701 - 02/08 0700 In: 382.5 [I.V.:282.5; IV Piggyback:100] Out: 2055 [Urine:1945; Chest Tube:110]  Intake/Output this shift: Total I/O In: -  Out: 60 [Urine:60]  Current Meds: Scheduled Meds: . aspirin EC  325 mg Oral Daily  . atorvastatin  80 mg Oral Q0600  . buPROPion  300 mg Oral BH-q7a  . carvedilol  6.25 mg Oral Daily  . clonazePAM  1 mg Oral TID  . clonazePAM  1 mg Oral TID  . docusate sodium  200 mg Oral Daily  . enoxaparin (LOVENOX) injection  30 mg Subcutaneous QHS  . fentaNYL  100 mcg Transdermal Q72H  . insulin aspart  0-24 Units Subcutaneous TID AC & HS  . insulin detemir  15 Units Subcutaneous Daily  . linagliptin  5 mg Oral Daily    . moving right along book   Does not apply Once  . mupirocin ointment  1 application Nasal BID  . pantoprazole  40 mg Oral QAC breakfast  . sodium chloride flush  3 mL Intravenous Q12H  . tamsulosin  0.4 mg Oral BID   Continuous Infusions:  PRN Meds:.sodium chloride, bisacodyl **OR** bisacodyl, ondansetron **OR** ondansetron (ZOFRAN) IV, oxyCODONE, sodium chloride flush, traMADol  General appearance: alert and cooperative Neurologic: intact Heart: regular rate and rhythm, S1, S2 normal, no murmur, click, rub or gallop Lungs: diminished breath sounds bibasilar Abdomen: soft, non-tender; bowel sounds normal; no masses,  no organomegaly Extremities: extremities normal, atraumatic, no cyanosis or edema and Homans sign is negative, no sign of DVT Wound: stabvle sternum  Lab Results: CBC: Recent Labs  09/23/15 1630 09/23/15 1641 09/24/15 0400  WBC 13.1*  --  10.9*  HGB 10.6* 12.2* 9.8*  HCT 32.5* 36.0* 31.0*  PLT 170  --  175   BMET:  Recent Labs  09/23/15 0340  09/23/15 1641 09/24/15 0400  NA 133*  --  135 132*  K 3.6  --  3.9 3.9  CL 101  --  97* 101  CO2  23  --   --  25  GLUCOSE 123*  --  188* 116*  BUN 11  --  12 12  CREATININE 0.64  < > 0.60* 0.64  CALCIUM 7.8*  --   --  7.8*  < > = values in this interval not displayed.  PT/INR:  Recent Labs  09/22/15 1350  LABPROT 16.9*  INR 1.37   Radiology: Dg Chest Port 1 View  09/24/2015  CLINICAL DATA:  Chest tube EXAM: PORTABLE CHEST 1 VIEW COMPARISON:  09/23/2015 FINDINGS: Left chest tube removed. Swan-Ganz catheter removed. Left jugular sheath remains in the left innominate vein. No pneumothorax Left lower lobe atelectasis unchanged. No significant pleural effusion. No edema. IMPRESSION: Left chest tube and Swan-Ganz catheter removed.  No pneumothorax Mild left lower lobe atelectasis remains. Electronically Signed   By: Franchot Gallo M.D.   On: 09/24/2015 07:38     Assessment/Plan: S/P Procedure(s) (LRB): CORONARY  ARTERY BYPASS GRAFTING (CABG) times four using the right greater saphenous vein harvested endoscopically and the left internal mammary artery.  LIMA-LAD, SEQ SVG-DIAG & OM, SVG-PD. (N/A) Mobilize Diuresis Diabetes control Plan for transfer to step-down: see transfer orders Expected Acute  Blood - loss Anemia D/c foley Expected Acute  Blood - loss Anemia    [name] 09/24/2015 8:53 AM

## 2015-09-25 ENCOUNTER — Inpatient Hospital Stay (HOSPITAL_COMMUNITY): Payer: Medicare Other

## 2015-09-25 LAB — BASIC METABOLIC PANEL
Anion gap: 6 (ref 5–15)
BUN: 13 mg/dL (ref 6–20)
CO2: 24 mmol/L (ref 22–32)
Calcium: 7.6 mg/dL — ABNORMAL LOW (ref 8.9–10.3)
Chloride: 99 mmol/L — ABNORMAL LOW (ref 101–111)
Creatinine, Ser: 0.61 mg/dL (ref 0.61–1.24)
GFR calc Af Amer: >60 mL/min (ref >60)
GFR calc non Af Amer: >60 mL/min (ref >60)
Glucose, Bld: 120 mg/dL — ABNORMAL HIGH (ref 65–99)
Potassium: 3.6 mmol/L (ref 3.5–5.1)
Sodium: 129 mmol/L — ABNORMAL LOW (ref 135–145)

## 2015-09-25 LAB — GLUCOSE, CAPILLARY
Glucose-Capillary: 186 mg/dL — ABNORMAL HIGH (ref 65–99)
Glucose-Capillary: 147 mg/dL — ABNORMAL HIGH (ref 65–99)
Glucose-Capillary: 124 mg/dL — ABNORMAL HIGH (ref 65–99)

## 2015-09-25 LAB — CBC
HCT: 27.6 % — ABNORMAL LOW (ref 39.0–52.0)
Hemoglobin: 9.4 g/dL — ABNORMAL LOW (ref 13.0–17.0)
MCH: 29.5 pg (ref 26.0–34.0)
MCHC: 34.1 g/dL (ref 30.0–36.0)
MCV: 86.5 fL (ref 78.0–100.0)
Platelets: 171 10*3/uL (ref 150–400)
RBC: 3.19 MIL/uL — ABNORMAL LOW (ref 4.22–5.81)
RDW: 14.2 % (ref 11.5–15.5)
WBC: 9.4 10*3/uL (ref 4.0–10.5)

## 2015-09-25 LAB — TSH: TSH: 2.388 u[IU]/mL (ref 0.350–4.500)

## 2015-09-25 MED ORDER — AMIODARONE HCL 200 MG PO TABS: 400.0000 mg | ORAL_TABLET | Freq: Every day | ORAL | Status: DC

## 2015-09-25 MED ORDER — POTASSIUM CHLORIDE CRYS ER 20 MEQ PO TBCR
40.0000 meq | EXTENDED_RELEASE_TABLET | Freq: Once | ORAL | Status: AC
  Administered 2015-09-25: 40 meq via ORAL
  Filled 2015-09-25: qty 2

## 2015-09-25 MED ORDER — MAGNESIUM HYDROXIDE 400 MG/5ML PO SUSP
30.0000 mL | Freq: Every day | ORAL | Status: DC | PRN
  Filled 2015-09-25 (×2): qty 30

## 2015-09-25 MED ORDER — AMIODARONE HCL 200 MG PO TABS
400.0000 mg | ORAL_TABLET | Freq: Two times a day (BID) | ORAL | Status: DC
  Administered 2015-09-25 – 2015-09-29 (×9): 400 mg via ORAL
  Filled 2015-09-25 (×11): qty 2

## 2015-09-25 NOTE — Progress Notes (Signed)
Amiodarone Drug - Drug Interaction Consult Note  Recommendations: Can consider dose reduction of atorvastatin to 40 mg   Amiodarone is metabolized by the cytochrome P450 system and therefore has the potential to cause many drug interactions. Amiodarone has an average plasma half-life of 50 days (range 20 to 100 days).   There is potential for drug interactions to occur several weeks or months after stopping treatment and the onset of drug interactions may be slow after initiating amiodarone.   [x]  Statins: Increased risk of myopathy. Simvastatin- restrict dose to 20mg  daily. Other statins: counsel patients to report any muscle pain or weakness immediately.  []  Anticoagulants: Amiodarone can increase anticoagulant effect. Consider warfarin dose reduction. Patients should be monitored closely and the dose of anticoagulant altered accordingly, remembering that amiodarone levels take several weeks to stabilize.  []  Antiepileptics: Amiodarone can increase plasma concentration of phenytoin, the dose should be reduced. Note that small changes in phenytoin dose can result in large changes in levels. Monitor patient and counsel on signs of toxicity.  [x]  Beta blockers: increased risk of bradycardia, AV block and myocardial depression. Sotalol - avoid concomitant use.  []   Calcium channel blockers (diltiazem and verapamil): increased risk of bradycardia, AV block and myocardial depression.  []   Cyclosporine: Amiodarone increases levels of cyclosporine. Reduced dose of cyclosporine is recommended.  []  Digoxin dose should be halved when amiodarone is started.  []  Diuretics: increased risk of cardiotoxicity if hypokalemia occurs.  []  Oral hypoglycemic agents (glyburide, glipizide, glimepiride): increased risk of hypoglycemia. Patient's glucose levels should be monitored closely when initiating amiodarone therapy.   []  Drugs that prolong the QT interval:  Torsades de pointes risk may be increased with  concurrent use - avoid if possible.  Monitor QTc, also keep magnesium/potassium WNL if concurrent therapy can't be avoided. Marland Kitchen Antibiotics: e.g. fluoroquinolones, erythromycin. . Antiarrhythmics: e.g. quinidine, procainamide, disopyramide, sotalol. . Antipsychotics: e.g. phenothiazines, haloperidol.  . Lithium, tricyclic antidepressants, and methadone.   Thank You,   Heloise Ochoa, Pharm.D., BCPS PGY2 Cardiology Pharmacy Resident Pager: [phone] 09/25/2015 11:01 AM

## 2015-09-25 NOTE — Progress Notes (Signed)
Pt converted to atrial fibrillation this morning, pt asymptomatic, EKG completed, vitals WDL, on-call MD called, answering service recommended to speak with pt's MD this am, RN will pass information to oncoming RN, pt has call bell within reach and bed in lowest position.   Izola Price, RN 09/25/2015

## 2015-09-25 NOTE — Progress Notes (Signed)
CARDIAC REHAB PHASE I   Pt in bed, states he just ambulated 150 ft with RN using rolling walker. Pt states he is very tired. Pt declines additional ambulation at this time. Encouraged additional ambulation x1 today. Will follow up tomorrow.   Lenna Sciara, RN, BSN 09/25/2015 3:01 PM

## 2015-09-25 NOTE — Progress Notes (Signed)
Utilization review completed.  

## 2015-09-25 NOTE — Care Management Note (Addendum)
Case Management Note Previous CM note initiated by Jacqlyn Krauss RN, CM  Patient Details  Name: [name] MRN: [id number] Date of Birth: 23-Sep-1945  Subjective/Objective:  Pt admitted for Chest Pain. Pt is planned for cardiac cath 09-19-15. Pt is from home alone. Per pt he still drives and is not homebound.   Action/Plan: No needs identified by CM at this time. CM will continue to monitor.    Expected Discharge Date:                  Expected Discharge Plan:  Home/Self Care  In-House Referral:  NA  Discharge planning Services  CM Consult  Post Acute Care Choice:  NA Choice offered to:  NA  DME Arranged:  N/A DME Agency:  NA  HH Arranged:  NA HH Agency:  NA  Status of Service:  Completed, signed off  Medicare Important Message Given:  Yes Date Medicare IM Given:    Medicare IM give by:    Date Additional Medicare IM Given:    Additional Medicare Important Message give by:     If discussed at [place] of Stay Meetings, dates discussed:    Additional Comments:  09/25/15- 1030- Malu Pellegrini RN, BSN- pt tx from 2S on 2/8- s/p CABGx4- CM will follow for d/c needs 1430- update- spoke with pt and son-Sam- regarding d/c plans- pt home alone has a friend [name] that might can assist some at discharge with cooking and cleaning etc- otherwise pt does not have anyone here- Son lives in [place]- states that he will be here about a week before needing to return home- Son is interested in having pt come live with him and is asking about timing of moving pt to [place]- advised son and pt to have a discussion with Dr. Servando Snare regarding this plan and when pt might be stable to make such a trip from a surgical standpoint. It would be a good plan for pt to be near family that could watch after him and make sure he f/u with his doctor's and such which pt does not have here. Will try to facilitate making sure pt's son has opportunity to speak with MD- bedside RN also aware that son would  like to speak with MD.  Dawayne Patricia, RN 09/25/2015, 10:28 AM

## 2015-09-25 NOTE — Progress Notes (Addendum)
[address] [facility]       [place] [address]             [phone]      3 Days Post-Op Procedure(s) (LRB): CORONARY ARTERY BYPASS GRAFTING (CABG) times four using the right greater saphenous vein harvested endoscopically and the left internal mammary artery.  LIMA-LAD, SEQ SVG-DIAG & OM, SVG-PD. (N/A) Subjective: Went into atrial fibrillation, foley replaced for retention  Objective: Vital signs in last 24 hours: Temp:  [98.5 F (36.9 C)-99.4 F (37.4 C)] 99.2 F (37.3 C) (02/09 0506) Pulse Rate:  [77-92] 88 (02/09 0506) Cardiac Rhythm:  [-] Atrial fibrillation (02/09 0647) Resp:  [16-18] 18 (02/09 0506) BP: (118-157)/(55-68) 143/68 mmHg (02/09 0506) SpO2:  [91 %-100 %] 92 % (02/09 0506)  Hemodynamic parameters for last 24 hours:    Intake/Output from previous day: 02/08 0701 - 02/09 0700 In: 360 [P.O.:360] Out: 860 [Urine:860] Intake/Output this shift:    General appearance: alert, cooperative, fatigued and no distress Heart: irregularly irregular rhythm Lungs: dim in bases in bases Abdomen: dim in bases Extremities: no signif edema Wound: incis healing well LE, chest dressing is intact with min drainage Abdomen_ soft, non tender Lab Results:  Recent Labs  09/24/15 0400 09/25/15 0601  WBC 10.9* 9.4  HGB 9.8* 9.4*  HCT 31.0* 27.6*  PLT 175 171   BMET:   Recent Labs  09/24/15 0400 09/25/15 0601  NA 132* 129*  K 3.9 3.6  CL 101 99*  CO2 25 24  GLUCOSE 116* 120*  BUN 12 13  CREATININE 0.64 0.61  CALCIUM 7.8* 7.6*    PT/INR:   Recent Labs  09/22/15 1350  LABPROT 16.9*  INR 1.37   ABG    Component Value Date/Time   PHART 7.317* 09/22/2015 1908   HCO3 22.8 09/22/2015 1908   TCO2 22 09/23/2015 1641   ACIDBASEDEF 3.0* 09/22/2015 1908   O2SAT 95.0 09/22/2015 1908   CBG (last 3)   Recent Labs  09/24/15 1135 09/24/15 1635 09/24/15 2116  GLUCAP 117* 140* 106*    Meds Scheduled Meds: . aspirin EC  325 mg Oral Daily    . atorvastatin  80 mg Oral Q0600  . buPROPion  300 mg Oral BH-q7a  . carvedilol  6.25 mg Oral Daily  . clonazePAM  1 mg Oral TID  . docusate sodium  200 mg Oral Daily  . enoxaparin (LOVENOX) injection  30 mg Subcutaneous QHS  . feeding supplement (GLUCERNA SHAKE)  237 mL Oral TID BM  . fentaNYL  100 mcg Transdermal Q72H  . insulin aspart  0-24 Units Subcutaneous TID AC & HS  . insulin detemir  15 Units Subcutaneous Daily  . linagliptin  5 mg Oral Daily  . mupirocin ointment  1 application Nasal BID  . pantoprazole  40 mg Oral QAC breakfast  . sodium chloride flush  3 mL Intravenous Q12H  . tamsulosin  0.4 mg Oral BID   Continuous Infusions:  PRN Meds:.sodium chloride, bisacodyl **OR** bisacodyl, ondansetron **OR** ondansetron (ZOFRAN) IV, oxyCODONE, sodium chloride flush, traMADol  Xrays Dg Chest Port 1 View  09/24/2015  CLINICAL DATA:  Chest tube EXAM: PORTABLE CHEST 1 VIEW COMPARISON:  09/23/2015 FINDINGS: Left chest tube removed. Swan-Ganz catheter removed. Left jugular sheath remains in the left innominate vein. No pneumothorax Left lower lobe atelectasis unchanged. No significant pleural effusion. No edema. IMPRESSION: Left chest tube and Swan-Ganz catheter removed.  No pneumothorax Mild left lower lobe atelectasis remains.  Electronically Signed   By: Franchot Gallo M.D.   On: 09/24/2015 07:38    Assessment/Plan: S/P Procedure(s) (LRB): CORONARY ARTERY BYPASS GRAFTING (CABG) times four using the right greater saphenous vein harvested endoscopically and the left internal mammary artery.  LIMA-LAD, SEQ SVG-DIAG & OM, SVG-PD. (N/A)  1 new onset afib, will start on po amio with only periph IV, rate isn't too high. ON coreg currently 2 cont foley for now- on flomax, will need voiding trial 3 replace K+ , to get >4 4 ABL anemia- HCT now 27- cont to monitor 5 hyponatremia- monitor 6 renal fxn is normal 7 sugars adeq controlled     LOS: 5 days    GOLD,WAYNE E 09/25/2015  Foley  replaced for urinary retention I have seen and examined [name] and agree with the above assessment  and plan.  [name] MD Beeper [phone] Office [phone] 09/25/2015 3:28 PM

## 2015-09-26 LAB — GLUCOSE, CAPILLARY
GLUCOSE-CAPILLARY: 122 mg/dL — AB (ref 65–99)
Glucose-Capillary: 127 mg/dL — ABNORMAL HIGH (ref 65–99)
Glucose-Capillary: 140 mg/dL — ABNORMAL HIGH (ref 65–99)
Glucose-Capillary: 122 mg/dL — ABNORMAL HIGH (ref 65–99)

## 2015-09-26 LAB — MAGNESIUM: MAGNESIUM: 1.9 mg/dL (ref 1.7–2.4)

## 2015-09-26 MED ORDER — MAGNESIUM OXIDE 400 (241.3 MG) MG PO TABS
400.0000 mg | ORAL_TABLET | Freq: Two times a day (BID) | ORAL | Status: AC
Start: 2015-09-26 — End: 2015-09-28
  Administered 2015-09-26 – 2015-09-28 (×6): 400 mg via ORAL
  Filled 2015-09-26 (×6): qty 1

## 2015-09-26 NOTE — Discharge Instructions (Signed)
Atrial Fibrillation Atrial fibrillation is a type of heartbeat that is irregular or fast (rapid). If you have this condition, your heart keeps quivering in a weird (chaotic) way. This condition can make it so your heart cannot pump blood normally. Having this condition gives a person more risk for stroke, heart failure, and other heart problems. There are different types of atrial fibrillation. Talk with your doctor to learn about the type that you have. HOME CARE  Take over-the-counter and prescription medicines only as told by your doctor.  If your doctor prescribed a blood-thinning medicine, take it exactly as told. Taking too much of it can cause bleeding. If you do not take enough of it, you will not have the protection that you need against stroke and other problems.  Do not use any tobacco products. These include cigarettes, chewing tobacco, and e-cigarettes. If you need help quitting, ask your doctor.  If you have apnea (obstructive sleep apnea), manage it as told by your doctor.  Do not drink alcohol.  Do not drink beverages that have caffeine. These include coffee, soda, and tea.  Maintain a healthy weight. Do not use diet pills unless your doctor says they are safe for you. Diet pills may make heart problems worse.  Follow diet instructions as told by your doctor.  Exercise regularly as told by your doctor.  Keep all follow-up visits as told by your doctor. This is important. GET HELP IF:  You notice a change in the speed, rhythm, or strength of your heartbeat.  You are taking a blood-thinning medicine and you notice more bruising.  You get tired more easily when you move or exercise. GET HELP RIGHT AWAY IF:  You have pain in your chest or your belly (abdomen).  You have sweating or weakness.  You feel sick to your stomach (nauseous).  You notice blood in your throw up (vomit), poop (stool), or pee (urine).  You are short of breath.  You suddenly have swollen feet  and ankles.  You feel dizzy.  Your suddenly get weak or numb in your face, arms, or legs, especially if it happens on one side of your body.  You have trouble talking, trouble understanding, or both.  Your face or your eyelid droops on one side. These symptoms may be an emergency. Do not wait to see if the symptoms will go away. Get medical help right away. Call your local emergency services (911 in the [place] Do not drive yourself to the hospital.   This information is not intended to replace advice given to you by your health care provider. Make sure you discuss any questions you have with your health care provider.   Document Released: 05/11/2008 Document Revised: 04/23/2015 Document Reviewed: 11/27/2014 Elsevier Interactive Patient Education 2016 [employer]. Coronary Artery Bypass Grafting, Care After These instructions give you information on caring for yourself after your procedure. Your doctor may also give you more specific instructions. Call your doctor if you have any problems or questions after your procedure.  HOME CARE  Only take medicine as told by your doctor. Take medicines exactly as told. Do not stop taking medicines or start any new medicines without talking to your doctor first.  Take your pulse as told by your doctor.  Do deep breathing as told by your doctor. Use your breathing device (incentive spirometer), if given, to practice deep breathing several times a day. Support your chest with a pillow or your arms when you take deep breaths or cough.  Keep the area clean, dry, and protected where the surgery cuts (incisions) were made. Remove bandages (dressings) only as told by your doctor. If strips were applied to surgical area, do not take them off. They fall off on their own.  Check the surgery area daily for puffiness (swelling), redness, or leaking fluid.  If surgery cuts were made in your legs:  Avoid crossing your legs.  Avoid sitting for long periods of  time. Change positions every 30 minutes.  Raise your legs when you are sitting. Place them on pillows.  Wear stockings that help keep blood clots from forming in your legs (compression stockings).  Only take sponge baths until your doctor says it is okay to take showers. Pat the surgery area dry. Do not rub the surgery area with a washcloth or towel. Do not bathe, swim, or use a hot tub until your doctor says it is okay.  Eat foods that are high in fiber. These include raw fruits and vegetables, whole grains, beans, and nuts. Choose lean meats. Avoid canned, processed, and fried foods.  Drink enough fluids to keep your pee (urine) clear or pale yellow.  Weigh yourself every day.  Rest and limit activity as told by your doctor. You may be told to:  Stop any activity if you have chest pain, shortness of breath, changes in heartbeat, or dizziness. Get help right away if this happens.  Move around often for short amounts of time or take short walks as told by your doctor. Gradually become more active. You may need help to strengthen your muscles and build endurance.  Avoid lifting, pushing, or pulling anything heavier than 10 pounds (4.5 kg) for at least 6 weeks after surgery.  Do not drive until your doctor says it is okay.  Ask your doctor when you can go back to work.  Ask your doctor when you can begin sexual activity again.  Follow up with your doctor as told. GET HELP IF:  You have puffiness, redness, more pain, or fluid draining from the incision site.  You have a fever.  You have puffiness in your ankles or legs.  You have pain in your legs.  You gain 2 or more pounds (0.9 kg) a day.  You feel sick to your stomach (nauseous) or throw up (vomit).  You have watery poop (diarrhea). GET HELP RIGHT AWAY IF:  You have chest pain that goes to your jaw or arms.  You have shortness of breath.  You have a fast or irregular heartbeat.  You notice a "clicking" in your  breastbone when you move.  You have numbness or weakness in your arms or legs.  You feel dizzy or light-headed. MAKE SURE YOU:  Understand these instructions.  Will watch your condition.  Will get help right away if you are not doing well or get worse.   This information is not intended to replace advice given to you by your health care provider. Make sure you discuss any questions you have with your health care provider.   Document Released: 08/07/2013 Document Reviewed: 08/07/2013 Elsevier Interactive Patient Education [employer].

## 2015-09-26 NOTE — Progress Notes (Addendum)
[facility]       [place] [address]             [phone]      4 Days Post-Op Procedure(s) (LRB): CORONARY ARTERY BYPASS GRAFTING (CABG) times four using the right greater saphenous vein harvested endoscopically and the left internal mammary artery.  LIMA-LAD, SEQ SVG-DIAG & OM, SVG-PD. (N/A) Subjective: Feels fairly well, has been in and out of afib- currently in sinus  Objective: Vital signs in last 24 hours: Temp:  [98.4 F (36.9 C)-99.4 F (37.4 C)] 98.6 F (37 C) (02/10 0538) Pulse Rate:  [80-115] 80 (02/10 0538) Cardiac Rhythm:  [-] Normal sinus rhythm (02/09 1900) Resp:  [18-19] 18 (02/10 0538) BP: (128-141)/(57-69) 131/65 mmHg (02/10 0538) SpO2:  [94 %-99 %] 94 % (02/10 0538) Weight:  [210 lb 1.6 oz (95.3 kg)-212 lb 8.4 oz (96.4 kg)] 211 lb 10.3 oz (96 kg) (02/10 0538)  Hemodynamic parameters for last 24 hours:    Intake/Output from previous day: 02/09 0701 - 02/10 0700 In: 480 [P.O.:480] Out: 1300 [Urine:1300] Intake/Output this shift:    General appearance: alert, cooperative and no distress Heart: regular rate and rhythm Lungs: clear to auscultation bilaterally Abdomen: benign Extremities: no edema Wound: incis healing well  Lab Results:  Recent Labs  09/24/15 0400 09/25/15 0601  WBC 10.9* 9.4  HGB 9.8* 9.4*  HCT 31.0* 27.6*  PLT 175 171   BMET:  Recent Labs  09/24/15 0400 09/25/15 0601  NA 132* 129*  K 3.9 3.6  CL 101 99*  CO2 25 24  GLUCOSE 116* 120*  BUN 12 13  CREATININE 0.64 0.61  CALCIUM 7.8* 7.6*    PT/INR: No results for input(s): LABPROT, INR in the last 72 hours. ABG    Component Value Date/Time   PHART 7.317* 09/22/2015 1908   HCO3 22.8 09/22/2015 1908   TCO2 22 09/23/2015 1641   ACIDBASEDEF 3.0* 09/22/2015 1908   O2SAT 95.0 09/22/2015 1908   CBG (last 3)   Recent Labs  09/25/15 1659 09/25/15 2143 09/26/15 0618  GLUCAP 147* 124* 122*    Meds Scheduled Meds: . amiodarone  400 mg  Oral Q12H   Followed by  . [START ON 10/02/2015] amiodarone  400 mg Oral Daily  . aspirin EC  325 mg Oral Daily  . atorvastatin  80 mg Oral Q0600  . buPROPion  300 mg Oral BH-q7a  . carvedilol  6.25 mg Oral Daily  . clonazePAM  1 mg Oral TID  . docusate sodium  200 mg Oral Daily  . enoxaparin (LOVENOX) injection  30 mg Subcutaneous QHS  . feeding supplement (GLUCERNA SHAKE)  237 mL Oral TID BM  . fentaNYL  100 mcg Transdermal Q72H  . insulin aspart  0-24 Units Subcutaneous TID AC & HS  . insulin detemir  15 Units Subcutaneous Daily  . linagliptin  5 mg Oral Daily  . mupirocin ointment  1 application Nasal BID  . pantoprazole  40 mg Oral QAC breakfast  . sodium chloride flush  3 mL Intravenous Q12H  . tamsulosin  0.4 mg Oral BID   Continuous Infusions:  PRN Meds:.sodium chloride, bisacodyl **OR** bisacodyl, magnesium hydroxide, ondansetron **OR** ondansetron (ZOFRAN) IV, oxyCODONE, sodium chloride flush, traMADol  Xrays Dg Chest 2 View  09/25/2015  CLINICAL DATA:  70 year old male status post cardiothoracic surgery. Initial encounter. EXAM: CHEST  2 VIEW COMPARISON:  09/24/2015 and earlier. FINDINGS: Left IJ introducer sheath removed. Epicardial pacer wires  remain. Stable cardiac size and mediastinal contours. Sequelae of CABG. Visualized tracheal air column is within normal limits. Mildly improved lung volumes and left basilar ventilation. Residual small left pleural effusion and mild atelectasis. No acute pulmonary edema. Stable right lung. No new osseous abnormality identified. IMPRESSION: 1. Left IJ sheath removed.  Epicardial pacer wires remain. 2. Small left pleural effusion with left lung atelectasis. Electronically Signed   By: Genevie Ann M.D.   On: 09/25/2015 07:42    Assessment/Plan: S/P Procedure(s) (LRB): CORONARY ARTERY BYPASS GRAFTING (CABG) times four using the right greater saphenous vein harvested endoscopically and the left internal mammary artery.  LIMA-LAD, SEQ SVG-DIAG &  OM, SVG-PD. (N/A)  1 conts amio/coreg for afib- will also replace Mg++ to get >2.0 2 voiding trial today- on flomax 3 sugars with good control 4 repeat labs in am 5 push rehab/routine pulm toilet     LOS: 6 days    GOLD,WAYNE E 09/26/2015 Mild hyponatremia Foley out today Poss home 1-2 days if voiding I have seen and examined [name] and agree with the above assessment  and plan.  [name] MD Beeper [phone] Office [phone] 09/26/2015 9:36 AM

## 2015-09-26 NOTE — Progress Notes (Signed)
Patient ambulated in hallway 350 feet with walker. Gait unsteady at times, back in chair will continue to monitor patient. Tionne Carelli, Bettina Gavia RN

## 2015-09-26 NOTE — Discharge Summary (Signed)
Physician Discharge Summary  Patient ID: [name] MRN: [id number] DOB/AGE: 1946-05-31 70 y.o.  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 Unstable angina ([place]) 09/20/2015  . Chest pain, rule out acute myocardial infarction 09/18/2015  . Diabetes mellitus type 2 with peripheral artery disease ([place]) 09/18/2015  . Chronic hyponatremia 09/18/2015  . Chest pain on exertion 09/18/2015  . Varicose veins of lower extremities with other complications 97/98/9211  . Occlusion and stenosis of carotid artery without mention of cerebral infarction 10/18/2011  . SINUSITIS, ACUTE 03/13/2010  . BACK PAIN 11/13/2009  . FATIGUE 10/17/2009  . 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 Vitamin D deficiency 08/29/2009  . Hyposmolality and/or hyponatremia 08/29/2009  . VISUAL ACUITY, DECREASED, LEFT EYE 08/29/2009  . PERIPHERAL VASCULAR DISEASE 08/29/2009  . SINUSITIS, CHRONIC 08/29/2009  . NEPHROLITHIASIS 08/29/2009  . FIBROMYALGIA 08/29/2009  . BICEPS TENDON RUPTURE, RIGHT 08/11/2009  . DIABETES MELLITUS, TYPE II 08/05/2009  . Hyperlipidemia 08/05/2009  . ANEMIA-NOS 08/05/2009  . ANXIETY 08/05/2009  . DEPRESSION 08/05/2009  . Chronic pain syndrome 08/05/2009  . Essential hypertension 08/05/2009  . GERD 08/05/2009  . BENIGN PROSTATIC HYPERTROPHY 08/05/2009  . [place] DISEASE, LUMBAR  08/05/2009  . OSTEOPOROSIS 08/05/2009   HPI:at time of consultation   [name] is a 70 y.o. Male with past medical history of diabetes mellitus type 2 with last A1c of 6.9, chronic hyponatremia, history of tobacco abuse qiut in 1990 , essential hypertension, peripheral vascular disease that comes in for typical chest pain that started about a month ago. He relates that every time he tries to mow the lawn or goes for a walk he chest pain that improved after 5 minutes of sitting down. With this he also gets shortness of breath and profuse sweating. He relates sometimes the pain radiates to his left shoulder when he develops the chest pain. But denies any nausea vomiting. He went to see his primary care doctor on the day of admission referred him to the ED. Chest x-ray was done in the PCPs office that shows no acute finding.  In the ED admitted 2/2, cathed last night after 6pm First set of cardiac enzymes was negative   Patient has history of left greater saphenous vein laser ablation and right small saphenous vein laser ablation   He was felt to be a candidate for coronary artery surgical revascularization. 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 He was seen in vascular surgery consultation by Dr. Juanda Crumble fields. It was not felt he would require surgical intervention on this at this time.  Discharged Condition: good  Hospital Course: The patient was admitted by the hospitalist to rule out myocardial infarction. Cardiology consultation was obtained with Dr. Einar Gip who felt he would require cardiac catheterization. This was performed he was found to have severe coronary artery disease. Lanelle Bal M.D.  was consulted from cardiothoracic surgery and he was felt to be a candidate for surgical revascularization. Dr. Juanda Crumble fields on the patient in regards to his innominate artery obstructive findings. It was not felt that he would require surgical intervention on  this during the procedure. He was scheduled and underwent the below described operative procedure. Postoperatively he has done well. He has remained hemodynamically stable although he did have postoperative atrial fibrillation. He is subsequently been chemically cardioverted to sinus rhythm with amiodarone and beta blocker. All routine lines, monitors and drainage devices have been discontinued in the standard fashion. He did develop some sternal drainage and minor erythema of the lower pole of the sternal incision. He has subsequently been placed on oral Keflex.Marland Kitchen He is tolerating gradually increasing activities using standard protocols. Oxygen has been weaned and he maintains good saturations on room air. Blood sugars are under good control. He did have some difficulty postoperatively with voiding in the Foley catheter had to be replaced. It is subsequently been removed for a voiding trial.  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  SURGICAL PROCEDURE: Coronary artery bypass grafting x4 with left internal mammary to the left anterior descending coronary artery, sequential reverse saphenous vein graft to the highest diagonal and first obtuse marginal, reverse saphenous vein graft to the posterior descending with right thigh greater saphenous endo vein harvesting.  SURGEON: Lanelle Bal, MD.  FIRST ASSISTANT: John Giovanni, PA-C.  Discharge Exam: Blood pressure 122/53, pulse 63, temperature 98.1 F (36.7 C), temperature source Oral, resp. rate 18, height 6' (1.829 m), weight 212 lb 12.8 oz (96.525 kg), SpO2 96 %.  Cardiovascular:  RRR Pulmonary: Diminished at left base and mostly clear on the right Abdomen: Soft, non tender, bowel sounds present. Extremities: Mild bilateral lower extremity edema. Wounds: Clean and dry. No erythema or signs of infection. Drainage has discontinued.  Disposition: 01-Home or Self Care      Discharge Instructions    Amb Referral to Cardiac Rehabilitation    Complete by:  As directed   Diagnosis:  CABG            Medication List    STOP taking these medications        BUFFERED SALT Tabs     celecoxib 200 MG capsule  Commonly known as:  CELEBREX     cyclobenzaprine 10 MG tablet  Commonly known as:  FLEXERIL     fludrocortisone 0.1 MG tablet  Commonly known as:  FLORINEF     ORAL ELECTROLYTES PO     zolpidem 10 MG tablet  Commonly known as:  AMBIEN     zonisamide 50 MG capsule  Commonly known as:  ZONEGRAN      TAKE these medications        amiodarone 400 MG tablet  Commonly known as:  PACERONE  Take 1 tablet (400 mg total) by mouth daily.  Start taking on:  10/02/2015     aspirin 325 MG EC tablet  Take 1 tablet (325 mg total) by mouth daily.     atorvastatin 80 MG tablet  Commonly known as:  LIPITOR  Take 1 tablet (80 mg total) by mouth daily at 6 (six) AM.     BAYER BREEZE 2 SYSTEM w/Device  Kit  Use to check blood sugars once a day     BAYER BREEZE 2 TEST Disk  Generic drug:  Glucose Blood  USE TO TEST BLOOD GLUCOSE THREE TIMES DAILY     buPROPion 300 MG 24 hr tablet  Commonly known as:  WELLBUTRIN XL  Take 300 mg by mouth every morning.     carvedilol 6.25 MG tablet  Commonly known as:  COREG  Take 1 tablet by mouth daily.     cephALEXin 500 MG capsule  Commonly known as:  KEFLEX  Take 1 capsule (500 mg total) by mouth every 8 (eight) hours.     clonazePAM 1 MG tablet  Commonly known as:  KLONOPIN  Take 1 mg by mouth 3 (three) times daily.     fentaNYL 100 MCG/HR  Commonly known as:  DURAGESIC - dosed mcg/hr  Place 1 patch onto the  skin every 3 (three) days.     FLOMAX 0.4 MG Caps capsule  Generic drug:  tamsulosin  Take 1 capsule by mouth 2 (two) times daily.     lisinopril 2.5 MG tablet  Commonly known as:  PRINIVIL,ZESTRIL  Take 1 tablet (2.5 mg total) by mouth daily.     MICROLET LANCETS Misc     omeprazole 40 MG capsule  Commonly known as:  PRILOSEC  Take 1 capsule by mouth 2 (two) times daily.     oxyCODONE 5 MG immediate release tablet  Commonly known as:  Oxy IR/ROXICODONE  Take 1-2 tablets (5-10 mg total) by mouth every 4 (four) hours as needed for severe pain.     PROAIR HFA 108 (90 Base) MCG/ACT inhaler  Generic drug:  albuterol     sitaGLIPtin 100 MG tablet  Commonly known as:  JANUVIA  Take 1 tablet (100 mg total) by mouth daily.     Vitamin D 1000 units capsule  Take 5,000 Units by mouth daily.     VITAMIN E PO  Take 8,000 Units by mouth daily.       The patient has been discharged on:   1.Beta Blocker:  Yes [x   ]                              No   [   ]                              If No, reason:  2.Ace Inhibitor/ARB: Yes [  x ]                                     No  [    ]                                     If No, reason:  3.Statin:   Yes [  x ]                  No  [   ]                  If No, reason:  4.Ecasa:  Yes  [  x ]  No   [   ]                  If No, reason:  Follow-up Information    Follow up with Adrian Prows, MD.   Specialty:  Cardiology   Why:  please call to arrange appointment with cardiologist in [phone]   Contact information:   [address] [place] [address] [phone]       Follow up with [name], MD.   Specialty:  Cardiothoracic Surgery   Why:  Appointment on 10/02/2015 at 11:30 AM to see the surgeon for a check of your sternal incision   Contact information:   [place] [address] [phone]       Follow up with [name], MD.   Specialty:   Cardiothoracic Surgery   Why:  10/23/2015 11:30 AM to see the surgeon. Please obtain a chest x-ray at [facility] is located in the same office complex.   Contact information:   [address] [place] [address] [phone]       Signed: Odis Luster 09/29/2015, 12:36 PM

## 2015-09-26 NOTE — Progress Notes (Signed)
CARDIAC REHAB PHASE I   PRE:  Rate/Rhythm: 83 SR    BP: sitting 117/66    SaO2: 92 RA  MODE:  Ambulation: 350 ft   POST:  Rate/Rhythm: 99 SR    BP: sitting 158/62    SaO2: 97 RA   Pt with mod assist to get out of bed. Stood independently. Unsteady at times walking with RW, assumably due to orthopedic issues and weakness. Tired after walk, to recliner. No afib noted. Will f/u. Encouraged x2 more walks today. [place], [place], ACSM 09/26/2015 9:49 AM

## 2015-09-26 NOTE — Progress Notes (Signed)
Pt given PRN MOM this am. RN will continue to monitor.  Arnell Sieving, RN

## 2015-09-27 LAB — BASIC METABOLIC PANEL
ANION GAP: 8 (ref 5–15)
BUN: 8 mg/dL (ref 6–20)
CALCIUM: 7.9 mg/dL — AB (ref 8.9–10.3)
CO2: 28 mmol/L (ref 22–32)
CREATININE: 0.52 mg/dL — AB (ref 0.61–1.24)
Chloride: 91 mmol/L — ABNORMAL LOW (ref 101–111)
GFR calc Af Amer: >60 mL/min (ref >60)
GFR calc non Af Amer: >60 mL/min (ref >60)
GLUCOSE: 181 mg/dL — AB (ref 65–99)
Potassium: 4 mmol/L (ref 3.5–5.1)
Sodium: 127 mmol/L — ABNORMAL LOW (ref 135–145)

## 2015-09-27 LAB — GLUCOSE, CAPILLARY
GLUCOSE-CAPILLARY: 122 mg/dL — AB (ref 65–99)
Glucose-Capillary: 102 mg/dL — ABNORMAL HIGH (ref 65–99)
Glucose-Capillary: 166 mg/dL — ABNORMAL HIGH (ref 65–99)
Glucose-Capillary: 121 mg/dL — ABNORMAL HIGH (ref 65–99)

## 2015-09-27 LAB — CBC
HEMATOCRIT: 27.5 % — AB (ref 39.0–52.0)
Hemoglobin: 9.2 g/dL — ABNORMAL LOW (ref 13.0–17.0)
MCH: 28.9 pg (ref 26.0–34.0)
MCHC: 33.5 g/dL (ref 30.0–36.0)
MCV: 86.5 fL (ref 78.0–100.0)
Platelets: 265 10*3/uL (ref 150–400)
RBC: 3.18 MIL/uL — ABNORMAL LOW (ref 4.22–5.81)
RDW: 14 % (ref 11.5–15.5)
WBC: 7.7 10*3/uL (ref 4.0–10.5)

## 2015-09-27 MED ORDER — POTASSIUM CHLORIDE CRYS ER 20 MEQ PO TBCR
20.0000 meq | EXTENDED_RELEASE_TABLET | Freq: Every day | ORAL | Status: DC
  Administered 2015-09-27 – 2015-09-29 (×3): 20 meq via ORAL
  Filled 2015-09-27 (×3): qty 1

## 2015-09-27 MED ORDER — LISINOPRIL 2.5 MG PO TABS
2.5000 mg | ORAL_TABLET | Freq: Every day | ORAL | Status: DC
  Administered 2015-09-27 – 2015-09-29 (×3): 2.5 mg via ORAL
  Filled 2015-09-27 (×3): qty 1

## 2015-09-27 MED ORDER — FUROSEMIDE 40 MG PO TABS
40.0000 mg | ORAL_TABLET | Freq: Every day | ORAL | Status: DC
  Administered 2015-09-27 – 2015-09-29 (×3): 40 mg via ORAL
  Filled 2015-09-27 (×3): qty 1

## 2015-09-27 MED ORDER — LACTULOSE 10 GM/15ML PO SOLN
20.0000 g | Freq: Once | ORAL | Status: AC
  Administered 2015-09-27: 20 g via ORAL
  Filled 2015-09-27: qty 30

## 2015-09-27 NOTE — Progress Notes (Signed)
09/27/2015 9:18 AM EPW D/C'd per order and per protocol.  Ends intact. Pt. Tolerated well.  Advised bedrest x1hr.  Call bell in reach.  Vital signs collected per protocol. [place]

## 2015-09-27 NOTE — Progress Notes (Signed)
Ed completed with pt and son. [facility] reception. To watch video later. Will refer to [place]. [place] CES, ACSM 2:19 PM 09/27/2015

## 2015-09-27 NOTE — Progress Notes (Addendum)
      [address]       [place] [address]             [phone]        5 Days Post-Op Procedure(s) (LRB): CORONARY ARTERY BYPASS GRAFTING (CABG) times four using the right greater saphenous vein harvested endoscopically and the left internal mammary artery.  LIMA-LAD, SEQ SVG-DIAG & OM, SVG-PD. (N/A)  Subjective: Patient states no bowel movement yet  Objective: Vital signs in last 24 hours: Temp:  [98.7 F (37.1 C)-99.4 F (37.4 C)] 99.4 F (37.4 C) (02/11 0348) Pulse Rate:  [77-80] 77 (02/11 0348) Cardiac Rhythm:  [-] Normal sinus rhythm (02/10 2030) Resp:  [18] 18 (02/11 0348) BP: (129-158)/(51-93) 136/51 mmHg (02/11 0348) SpO2:  [93 %-96 %] 96 % (02/11 0348) Weight:  [214 lb 8 oz (97.297 kg)] 214 lb 8 oz (97.297 kg) (02/11 0348)  Pre op weight 91 kg Current Weight  09/27/15 214 lb 8 oz (97.297 kg)      Intake/Output from previous day: 02/10 0701 - 02/11 0700 In: 840 [P.O.:840] Out: 2850 [Urine:2850]   Physical Exam:  Cardiovascular: RRR Pulmonary: Diminished at left base and mostly clear on the right Abdomen: Soft, non tender, bowel sounds present. Extremities: Mild bilateral lower extremity edema. Wounds: Clean and dry.  No erythema or signs of infection.  Lab Results: CBC: Recent Labs  09/25/15 0601 09/27/15 0315  WBC 9.4 7.7  HGB 9.4* 9.2*  HCT 27.6* 27.5*  PLT 171 265   BMET:  Recent Labs  09/25/15 0601  NA 129*  K 3.6  CL 99*  CO2 24  GLUCOSE 120*  BUN 13  CREATININE 0.61  CALCIUM 7.6*    PT/INR:  Lab Results  Component Value Date   INR 1.37 09/22/2015   INR 1.20 09/21/2015   INR 1.12 09/19/2015   ABG:  INR: Will add last result for INR, ABG once components are confirmed Will add last 4 CBG results once components are confirmed  Assessment/Plan:  1. CV - PAF. On Amiodarone 400 mg bid, Coreg 6.25 mg bid. Will start low dose ACE for better BP control. 2.  Pulmonary - On room air. Encourage incentive  spirometer. 3. Volume Overload - Give Lasix 40 mg daily and will require after discharge 4.  Acute blood loss anemia - H and H stable at 9.2 and 27.5 5. Urinary retention-foley removed yesterday and voiding on own. Continue Flomax. 6. DM-CBGs 140/122/102. On Tradjenta and Insulin. Pre op HGA1C  7. Remove EPW 8. LOC constipation 9. Likely home in am  ZIMMERMAN,DONIELLE MPA-C 09/27/2015,8:05 AM  Patient seen and examined agree with above EPW are out Hopefully home in AM  [place] C. Roxan Hockey, MD [facility] [phone]

## 2015-09-27 NOTE — Progress Notes (Signed)
CARDIAC REHAB PHASE I   PRE:  Rate/Rhythm: 75 SR    BP: sitting 142/67    SaO2: 96 RA  MODE:  Ambulation: 550 ft   POST:  Rate/Rhythm: 90 SR    BP: sitting 159/67     SaO2: 93 RA  Pt continues to need assistance to get out of bed. Pushes with his arms to get to EOB. Pt is generally impulsive and not safe without RW (not using RW in room, trying to find something in his bag). Used RW for walking and fairly steady, tires easily. Increased distance. Will need RW for home. Pt to have son with him a few days. I am concerned that he is going to hurt himself by being impulsive. Will educate with son in pm. [id number]  Josephina Shih [place] CES, ACSM 09/27/2015 12:09 PM

## 2015-09-27 NOTE — Care Management Note (Addendum)
Case Management Note  Patient Details  Name: Cashus Halterman MRN: [id number] Date of Birth: 08-Nov-1945  Subjective/Objective:                    Action/Plan: spoke with Lala Lund, cardiac rehab, pt needs a RW   Expected Discharge Date:     09/28/15             Expected Discharge Plan:  Home/Self Care  In-House Referral:  NA  Discharge planning Services  CM Consult  Post Acute Care Choice:  NA Choice offered to:  NA  DME Arranged:  N/A DME Agency:  NA  HH Arranged:  NA HH Agency:  NA  Status of Service:  Completed, signed off  Medicare Important Message Given:  Yes Date Medicare IM Given:    Medicare IM give by:    Date Additional Medicare IM Given:    Additional Medicare Important Message give by:     If discussed at [place] of Stay Meetings, dates discussed:    Additional Comments: met with pt and son. Pt's gait is fairly steady. Discussed DME and HH PT eval and they agreed. Asked pt if he has a preference for a [facility] agency. He stated that he had [facility] in the past for wound care and he used [facility]. He agreed to use [facility]. Mellody Dance for [employer] and [facility] for [employer].  Norina Buzzard, RN 09/27/2015, 1:39 PM

## 2015-09-27 NOTE — Progress Notes (Signed)
Pt asleep in bed. Son requests we do not disturb him. Tele monitor showing NSR. Call bell and phone within reach. Son in room. Will continue to monitor.

## 2015-09-27 NOTE — Progress Notes (Signed)
Pt seen out of bed with a bloody shirt. Pt confused, saying he sees snakes with two heads, and mumbling words. Incision noted to be leaking from the bottom. Gauze placed on incision, pt placed in a gown, and put back to bed. Bed alarm on. Pt told to call if he needs anything. Will continue to monitor.

## 2015-09-28 LAB — GLUCOSE, CAPILLARY
GLUCOSE-CAPILLARY: 107 mg/dL — AB (ref 65–99)
GLUCOSE-CAPILLARY: 135 mg/dL — AB (ref 65–99)
GLUCOSE-CAPILLARY: 136 mg/dL — AB (ref 65–99)
Glucose-Capillary: 106 mg/dL — ABNORMAL HIGH (ref 65–99)

## 2015-09-28 MED ORDER — CEPHALEXIN 500 MG PO CAPS
500.0000 mg | ORAL_CAPSULE | Freq: Three times a day (TID) | ORAL | Status: DC
  Administered 2015-09-28 – 2015-09-29 (×4): 500 mg via ORAL
  Filled 2015-09-28 (×4): qty 1

## 2015-09-28 NOTE — Progress Notes (Addendum)
      [address]       [place] [address]             [phone]        6 Days Post-Op Procedure(s) (LRB): CORONARY ARTERY BYPASS GRAFTING (CABG) times four using the right greater saphenous vein harvested endoscopically and the left internal mammary artery.  LIMA-LAD, SEQ SVG-DIAG & OM, SVG-PD. (N/A)  Subjective: Had episode of confusion last evening. Alert and oriented this am. Only complaint is new sternal drainage.  Objective: Vital signs in last 24 hours: Temp:  [98.3 F (36.8 C)-98.5 F (36.9 C)] 98.5 F (36.9 C) (02/12 0459) Pulse Rate:  [71-80] 80 (02/12 0459) Cardiac Rhythm:  [-] Normal sinus rhythm (02/11 2000) Resp:  [19-20] 20 (02/12 0459) BP: (115-158)/(62-97) 158/62 mmHg (02/12 0459) SpO2:  [96 %-100 %] 96 % (02/12 0459) Weight:  [212 lb 3.2 oz (96.253 kg)] 212 lb 3.2 oz (96.253 kg) (02/12 0459)  Pre op weight 91 kg Current Weight  09/28/15 212 lb 3.2 oz (96.253 kg)      Intake/Output from previous day: 02/11 0701 - 02/12 0700 In: 363 [P.O.:360; I.V.:3] Out: 1400 [Urine:1400]   Physical Exam:  Cardiovascular: RRR Pulmonary: Diminished at left base and mostly clear on the right Abdomen: Soft, non tender, bowel sounds present. Extremities: Mild bilateral lower extremity edema. Wounds: Right lower extremity wound is clean and dry.  No erythema or signs of infection. Sternal wound with erythema and sero sanguinous drainage lower portion of sternal wound.  Lab Results: CBC:  Recent Labs  09/27/15 0315  WBC 7.7  HGB 9.2*  HCT 27.5*  PLT 265   BMET:   Recent Labs  09/27/15 1158  NA 127*  K 4.0  CL 91*  CO2 28  GLUCOSE 181*  BUN 8  CREATININE 0.52*  CALCIUM 7.9*    PT/INR:  Lab Results  Component Value Date   INR 1.37 09/22/2015   INR 1.20 09/21/2015   INR 1.12 09/19/2015   ABG:  INR: Will add last result for INR, ABG once components are confirmed Will add last 4 CBG results once components are  confirmed  Assessment/Plan:  1. CV - PAF. On Amiodarone 400 mg bid, Coreg 6.25 mg bid and Lisinopril 2.5 mg daily. Is hypertensive this am so will increase Lisinopril. 2.  Pulmonary - On room air. Encourage incentive spirometer. 3. Volume Overload - Give Lasix 40 mg daily and will require after discharge 4.  Acute blood loss anemia - H and H stable at 9.2 and 27.5 5. Urinary retention-foley removed yesterday and voiding on own. Continue Flomax. 6. DM-CBGs 121/122/107. On Tradjenta and Insulin. Pre op HGA1C  7. Sternal drainage with surrounding erythema lower proximal wound-likely fat necrosis. Will start Keflex   ZIMMERMAN,DONIELLE MPA-C 09/28/2015,8:02 AM   Patient seen and examined, agree with above Will start keflex for sternal drainage  Remo Lipps C. Roxan Hockey, MD [facility] [phone]

## 2015-09-28 NOTE — Progress Notes (Signed)
Patient ambulated 350 ft with a walker and tolerated it well

## 2015-09-29 LAB — CBC
HEMATOCRIT: 27.4 % — AB (ref 39.0–52.0)
HEMOGLOBIN: 8.8 g/dL — AB (ref 13.0–17.0)
MCH: 27.5 pg (ref 26.0–34.0)
MCHC: 32.1 g/dL (ref 30.0–36.0)
MCV: 85.6 fL (ref 78.0–100.0)
Platelets: 336 10*3/uL (ref 150–400)
RBC: 3.2 MIL/uL — ABNORMAL LOW (ref 4.22–5.81)
RDW: 14 % (ref 11.5–15.5)
WBC: 6.8 10*3/uL (ref 4.0–10.5)

## 2015-09-29 LAB — GLUCOSE, CAPILLARY
GLUCOSE-CAPILLARY: 123 mg/dL — AB (ref 65–99)
Glucose-Capillary: 95 mg/dL (ref 65–99)

## 2015-09-29 MED ORDER — LACTULOSE 10 GM/15ML PO SOLN
30.0000 g | Freq: Every day | ORAL | Status: DC | PRN
  Filled 2015-09-29: qty 45

## 2015-09-29 MED ORDER — OXYCODONE HCL 5 MG PO TABS: 5.0000 mg | ORAL_TABLET | ORAL | Status: DC | PRN

## 2015-09-29 MED ORDER — ATORVASTATIN CALCIUM 80 MG PO TABS: 80.0000 mg | ORAL_TABLET | Freq: Every day | ORAL | Status: DC

## 2015-09-29 MED ORDER — LISINOPRIL 2.5 MG PO TABS: 2.5000 mg | ORAL_TABLET | Freq: Every day | ORAL | Status: DC

## 2015-09-29 MED ORDER — CEPHALEXIN 500 MG PO CAPS: 500.0000 mg | ORAL_CAPSULE | Freq: Three times a day (TID) | ORAL | Status: DC

## 2015-09-29 MED ORDER — AMIODARONE HCL 400 MG PO TABS: 400.0000 mg | ORAL_TABLET | Freq: Every day | ORAL | Status: DC

## 2015-09-29 MED ORDER — ASPIRIN 325 MG PO TBEC: 325.0000 mg | DELAYED_RELEASE_TABLET | Freq: Every day | ORAL | Status: DC

## 2015-09-29 NOTE — Progress Notes (Signed)
Pt sts he is going home and declines walking. Will f/u if no d/c. Yves [place] CES, ACSM 11:34 AM 09/29/2015

## 2015-09-29 NOTE — Care Management Note (Signed)
Case Management Note Previous CM note initiated by Jacqlyn Krauss RN, CM  Patient Details  Name: [name] MRN: [id number] Date of Birth: [birth year]  Subjective/Objective:  Pt admitted for Chest Pain. Pt is planned for cardiac cath 09-19-15. Pt is from home alone. Per pt he still drives and is not homebound.   Action/Plan: No needs identified by CM at this time. CM will continue to monitor.    Expected Discharge Date:    09/29/15              Expected Discharge Plan:  Home/Self Care  In-House Referral:  NA  Discharge planning Services  CM Consult  Post Acute Care Choice:  Home Health, Durable Medical Equipment Choice offered to:  Patient  DME Arranged:  Walker rolling DME Agency:  [place]:  PT [facility] Agency:  [facility]  Status of Service:  Completed, signed off  Medicare Important Message Given:  Yes Date Medicare IM Given:    Medicare IM give by:    Date Additional Medicare IM Given:    Additional Medicare Important Message give by:     If discussed at [place] of Stay Meetings, dates discussed:    Discharge Disposition: home/home health   Additional Comments:   09/29/15- 1330- Marvetta Gibbons RN, BSN - pt for d/c home today with [facility]- referral has been made to [facility]- notified Manuela Schwartz with [facility] of pt's discharge home today- RW has been delivered to room by [facility]. No further CM needs.  Norina Buzzard, RN 09/27/2015, 1:39 PM met with pt and son. Pt's gait is fairly steady. Discussed DME and HH PT eval and they agreed. Asked pt if he has a preference for a [place] agency. He stated that he had [facility] in the past for wound care and he used [facility]. He agreed to use [facility]. Contacted Merry Proud for [employer] and [facility] for HHPT eval.   09/25/15- 1030- Marvetta Gibbons RN, BSN- pt tx from 2S on 2/8- s/p CABGx4- CM will follow for d/c needs 1430- update- spoke with pt and son-Sam- regarding d/c plans- pt home alone has a friend [name] that might can  assist some at discharge with cooking and cleaning etc- otherwise pt does not have anyone here- Son lives in [place]- states that he will be here about a week before needing to return home- Son is interested in having pt come live with him and is asking about timing of moving pt to [place]- advised son and pt to have a discussion with Dr. Servando Snare regarding this plan and when pt might be stable to make such a trip from a surgical standpoint. It would be a good plan for pt to be near family that could watch after him and make sure he f/u with his doctor's and such which pt does not have here. Will try to facilitate making sure pt's son has opportunity to speak with MD- bedside RN also aware that son would like to speak with MD.  Dawayne Patricia, RN 09/29/2015, 2:44 PM

## 2015-09-29 NOTE — Care Management Important Message (Signed)
Important Message  Patient Details  Name: [name] MRN: [id number] Date of Birth: 06/20/1946   Medicare Important Message Given:  Yes    Barb Merino Jordanna Hendrie 09/29/2015, 2:08 PM

## 2015-09-29 NOTE — Progress Notes (Addendum)
[address]       [place] [address]             [phone]      7 Days Post-Op Procedure(s) (LRB): CORONARY ARTERY BYPASS GRAFTING (CABG) times four using the right greater saphenous vein harvested endoscopically and the left internal mammary artery.  LIMA-LAD, SEQ SVG-DIAG & OM, SVG-PD. (N/A) Subjective:  anxious to go home. C/O of no recent BM  Objective: Vital signs in last 24 hours: Temp:  [98.1 F (36.7 C)-98.8 F (37.1 C)] 98.1 F (36.7 C) (02/13 0525) Pulse Rate:  [63-65] 63 (02/13 0525) Cardiac Rhythm:  [-] Normal sinus rhythm (02/12 2010) Resp:  [18] 18 (02/13 0525) BP: (122-131)/(53-77) 122/53 mmHg (02/13 0525) SpO2:  [96 %-98 %] 96 % (02/13 0525) Weight:  [212 lb 12.8 oz (96.525 kg)] 212 lb 12.8 oz (96.525 kg) (02/13 0525)  Hemodynamic parameters for last 24 hours:    Intake/Output from previous day: 02/12 0701 - 02/13 0700 In: 720 [P.O.:720] Out: 2025 [Urine:2025] Intake/Output this shift:    General appearance: alert, cooperative and no distress Heart: regular rate and rhythm Lungs: clear to auscultation bilaterally Abdomen: soft, non-tender Extremities: no edema Wound: + serous sternal drainage with slight surounding erethema lower pole of incis. Minor sternal click  Lab Results:  Recent Labs  09/27/15 0315 09/29/15 0255  WBC 7.7 6.8  HGB 9.2* 8.8*  HCT 27.5* 27.4*  PLT 265 336   BMET:  Recent Labs  09/27/15 1158  NA 127*  K 4.0  CL 91*  CO2 28  GLUCOSE 181*  BUN 8  CREATININE 0.52*  CALCIUM 7.9*    PT/INR: No results for input(s): LABPROT, INR in the last 72 hours. ABG    Component Value Date/Time   PHART 7.317* 09/22/2015 1908   HCO3 22.8 09/22/2015 1908   TCO2 22 09/23/2015 1641   ACIDBASEDEF 3.0* 09/22/2015 1908   O2SAT 95.0 09/22/2015 1908   CBG (last 3)   Recent Labs  09/28/15 1645 09/28/15 2143 09/29/15 0605  GLUCAP 136* 106* 95    Meds Scheduled Meds: . amiodarone  400 mg Oral Q12H    Followed by  . [START ON 10/02/2015] amiodarone  400 mg Oral Daily  . aspirin EC  325 mg Oral Daily  . atorvastatin  80 mg Oral Q0600  . buPROPion  300 mg Oral BH-[id number]  . carvedilol  6.25 mg Oral Daily  . cephALEXin  500 mg Oral 3 times per day  . clonazePAM  1 mg Oral TID  . docusate sodium  200 mg Oral Daily  . enoxaparin (LOVENOX) injection  30 mg Subcutaneous QHS  . feeding supplement (GLUCERNA SHAKE)  237 mL Oral TID BM  . fentaNYL  100 mcg Transdermal Q72H  . furosemide  40 mg Oral Daily  . insulin aspart  0-24 Units Subcutaneous TID AC & HS  . insulin detemir  15 Units Subcutaneous Daily  . linagliptin  5 mg Oral Daily  . lisinopril  2.5 mg Oral Daily  . pantoprazole  40 mg Oral QAC breakfast  . potassium chloride  20 mEq Oral Daily  . sodium chloride flush  3 mL Intravenous Q12H  . tamsulosin  0.4 mg Oral BID   Continuous Infusions:  PRN Meds:.sodium chloride, bisacodyl **OR** bisacodyl, magnesium hydroxide, ondansetron **OR** ondansetron (ZOFRAN) IV, oxyCODONE, sodium chloride flush, traMADol  Xrays No results found.  Assessment/Plan: S/P Procedure(s) (LRB): CORONARY ARTERY BYPASS GRAFTING (CABG) times four  using the right greater saphenous vein harvested endoscopically and the left internal mammary artery.  LIMA-LAD, SEQ SVG-DIAG & OM, SVG-PD. (N/A)  1 overall progression is good 2 on keflex for sternal issues. No leukocytosis or fevers currently 3 sugars adeq controlled- not on insulin at home 4 sinus rhythm- hemodyn stable 5 try lactulose for BM  LOS: 9 days    GOLD,WAYNE E 09/29/2015  Some drainage yesterday, now stopped Sternum appears stable  No fever and no elevated wbc, will let go home on po keflex see in office Thursday this week I have seen and examined Franchot Heidelberg and agree with the above assessment  and plan.  [name] MD Beeper [phone] Office [phone] 09/29/2015 12:24 PM

## 2015-10-02 ENCOUNTER — Other Ambulatory Visit: Payer: Self-pay | Admitting: Cardiothoracic Surgery

## 2015-10-02 ENCOUNTER — Encounter: Payer: Self-pay | Admitting: Cardiothoracic Surgery

## 2015-10-02 ENCOUNTER — Ambulatory Visit (INDEPENDENT_AMBULATORY_CARE_PROVIDER_SITE_OTHER): Payer: Self-pay | Admitting: Cardiothoracic Surgery

## 2015-10-02 VITALS — BP 123/59 | HR 74 | Temp 98.3°F | Resp 20 | Ht 72.0 in | Wt 212.0 lb

## 2015-10-02 DIAGNOSIS — Z951 Presence of aortocoronary bypass graft: Secondary | ICD-10-CM

## 2015-10-02 MED ORDER — CEPHALEXIN 500 MG PO CAPS: 500.0000 mg | ORAL_CAPSULE | Freq: Three times a day (TID) | ORAL | Status: DC

## 2015-10-02 NOTE — Progress Notes (Signed)
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 SURGICAL PROCEDURE: Coronary artery bypass grafting x4 with left internal mammary to the left anterior descending coronary artery, sequential reverse saphenous vein graft to the highest diagonal and first obtuse marginal, reverse saphenous vein graft to the posterior descending with right thigh greater saphenous endo vein harvesting. SURGEON: Lanelle Bal, MD.  History of Present Illness:     Patient returns to the office for early follow-up for wound check. Before discharge he had had some bruising around the lower portion of the sternal incision without fever or chills or definite infection he was discharged home on Keflex which she's been taking. Since being home he is had no fever chills. He's increasing his physical activity appropriately. He notes no drainage from his incision.   Past Surgical History  Procedure Laterality Date  . Right hand fracture  1969  . Left ankle ganglion cyst  1976  . Lumbar disc surgery  1981, 2004  . Nasal sinus surgery      multiple- x8. last 1997 with obliteration  . Left cataract  1996    right - 1997  . Left foot surgery  1998    R surgery/fracture - 1999  . Left cts  2006    R CTS - 2007  . Left wrist/hand fusion  2008  . L foot open repair jones fracture  2010     5th metetarsal   . Rotator cuff repair  2006    R, than repeat 2011, Dr Theda Sers  . Vocal surgery  1996  . Repair thoracic aorta    . Trigger finger release Left 11/11/2014    Procedure: LEFT LONG FINGER RELEASE TRIGGER  FINGER/A-1 PULLEY;  Surgeon: Milly Jakob, MD;  Location: [facility];  Service: Orthopedics;  Laterality: Left;  Marland Kitchen Eye surgery    . Cardiac catheterization N/A 09/19/2015    Procedure: Left Heart Cath and Coronary Angiography;  Surgeon: Adrian Prows, MD;  Location: [facility];  Service: Cardiovascular;  Laterality: N/A;  . Coronary artery bypass graft N/A 09/22/2015    Procedure: CORONARY ARTERY BYPASS GRAFTING (CABG) times four using the right greater saphenous vein harvested endoscopically and the left internal mammary artery.  LIMA-LAD, SEQ SVG-DIAG & OM, SVG-PD.;  Surgeon: [name], MD;  Location: [facility];  Service: Open Heart Surgery;  Laterality: N/A;    Past Medical History  Diagnosis Date  . DJD (degenerative joint disease)     L wrist  . Lumbar disc disease   . HTN (hypertension)   . Anemia     NOS  . Osteoporosis     tx per [facility], Dr Prudencio Burly, thought due to heavy steriod use after 1978  . Spine fracture     hx, multiple  . Blindness of left eye     near blindness. 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 GERD (gastroesophageal reflux disease)   . DM2 (diabetes mellitus, type 2) ([place])   . Anxiety   . Depression   . BPH (benign prostatic hypertrophy)   .  HLD (hyperlipidemia)   . Chronic pain syndrome   . Chronic hyponatremia   . Diabetes mellitus   . Broken foot Oct. 12, 2013    Right foot Fx  . Varicose veins      History  Smoking status  . Former Smoker -- 2.00 packs/day  . Types: Cigarettes  . Quit date: 11/06/1988  Smokeless tobacco  . Never Used    History  Alcohol Use No     Allergies  Allergen Reactions  . Ivp Dye [Iodinated Diagnostic Agents]     Blood Pressure dropped----pt was pre-medicated with 13 hour prep and did fine with pre-meds--amy 03/09/11   . Penicillins Nausea Only    REACTION: gi upset  . Sulfonamide Derivatives Hives and Itching  . Sulfa Antibiotics Hives  . Propoxyphene N-Acetaminophen Other (See Comments)    Sharp pains-  headache    Current Outpatient Prescriptions  Medication Sig Dispense Refill  . amiodarone (PACERONE) 400 MG tablet Take 1 tablet (400 mg total) by mouth daily. 30 tablet 1  . aspirin EC 325 MG EC tablet Take 1 tablet (325 mg total) by mouth daily.    Marland Kitchen atorvastatin (LIPITOR) 80 MG tablet Take 1 tablet (80 mg total) by mouth daily at 6 (six) AM. 30 tablet 1  . BAYER BREEZE 2 TEST DISK USE TO TEST BLOOD GLUCOSE THREE TIMES DAILY 100 each 3  . Blood Glucose Monitoring Suppl (BAYER BREEZE 2 SYSTEM) W/DEVICE KIT Use to check blood sugars once a day 1 kit 0  . buPROPion (WELLBUTRIN XL) 300 MG 24 hr tablet Take 300 mg by mouth every morning.     . carvedilol (COREG) 6.25 MG tablet Take 1 tablet by mouth daily.    . cephALEXin (KEFLEX) 500 MG capsule Take 1 capsule (500 mg total) by mouth every 8 (eight) hours. 21 capsule 0  . Cholecalciferol (VITAMIN D) 1000 UNITS capsule Take 5,000 Units by mouth daily.     . clonazePAM (KLONOPIN) 1 MG tablet Take 1 mg by mouth 3 (three) times daily.     . fentaNYL (DURAGESIC - DOSED MCG/HR) 100 MCG/HR Place 1 patch onto the skin every 3 (three) days.    Marland Kitchen lisinopril (PRINIVIL,ZESTRIL) 2.5 MG tablet Take 1 tablet (2.5 mg total) by mouth daily. 30 tablet 1  . MICROLET LANCETS MISC     . omeprazole (PRILOSEC) 40 MG capsule Take 1 capsule by mouth 2 (two) times daily.    Marland Kitchen oxyCODONE (OXY IR/ROXICODONE) 5 MG immediate release tablet Take 1-2 tablets (5-10 mg total) by mouth every 4 (four) hours as needed for severe pain. 50 tablet 0  . PROAIR HFA 108 (90 BASE) MCG/ACT inhaler     . sitaGLIPtin (JANUVIA) 100 MG tablet Take 1 tablet (100 mg total) by mouth daily. 30 tablet 3  . tamsulosin (FLOMAX) 0.4 MG CAPS capsule Take 1 capsule by mouth 2 (two) times daily.    Marland Kitchen VITAMIN E PO Take 8,000 Units by mouth daily.     No current facility-administered medications for this visit.       Physical Exam: BP 123/59 mmHg  Pulse 74  Temp(Src) 98.3 F (36.8 C) (Oral)   Resp 20  Ht 6' (1.829 m)  Wt 212 lb (96.163 kg)  BMI 28.75 kg/m2  SpO2 95%  General appearance: alert, cooperative and no distress Neurologic: intact Heart: regular rate and rhythm, S1, S2 normal, no murmur, click, rub or gallop Lungs: clear to auscultation bilaterally Abdomen: soft, non-tender; bowel sounds normal;  no masses,  no organomegaly Extremities: extremities normal, atraumatic, no cyanosis or edema and Homans sign is negative, no sign of DVT Wound: Incisions little change from time of discharge, sternum is stable without movement, is no significant drainage from the incision, there is some bruising around the lower portion of the incision, and potential suture reaction. A lower incision was doing with Betadine and a tuberculin syringe was used to aspirate no fluid was returned, a few drops of serous solution was expressed from the site of the needlestick and culture.   Diagnostic Studies & Laboratory data:     Recent Radiology Findings:   No results found.    Recent Lab Findings: Lab Results  Component Value Date   WBC 6.8 09/29/2015   HGB 8.8* 09/29/2015   HCT 27.4* 09/29/2015   PLT 336 09/29/2015   GLUCOSE 181* 09/27/2015   CHOL 155 11/15/2014   TRIG 85.0 11/15/2014   HDL 39.80 11/15/2014   LDLDIRECT 108.2 03/13/2010   LDLCALC 98 11/15/2014   ALT 23 09/21/2015   AST 29 09/21/2015   NA 127* 09/27/2015   K 4.0 09/27/2015   CL 91* 09/27/2015   CREATININE 0.52* 09/27/2015   BUN 8 09/27/2015   CO2 28 09/27/2015   TSH 2.388 09/25/2015   INR 1.37 09/22/2015   HGBA1C 7.0* 09/21/2015      Assessment / Plan:      Patient status post coronary artery bypass grafting with some areas of redness and bruising around the lower portion of his incision but without obvious signs or symptoms of infection, patient will continue on Keflex, culture was done results pending, will have the patient return in 4 days for follow-up wound check. At the time of his return he also have  a chest x-ray      [name] MD      [address] [place] [address] Office [phone]   Beeper [phone]  10/02/2015 12:47 PM

## 2015-10-03 ENCOUNTER — Other Ambulatory Visit: Payer: Self-pay | Admitting: Cardiothoracic Surgery

## 2015-10-03 DIAGNOSIS — Z951 Presence of aortocoronary bypass graft: Secondary | ICD-10-CM

## 2015-10-05 LAB — CULTURE, ROUTINE-ABSCESS
Gram Stain: NONE SEEN
Gram Stain: NONE SEEN
Organism ID, Bacteria: NO GROWTH

## 2015-10-06 ENCOUNTER — Encounter: Payer: Self-pay | Admitting: Cardiothoracic Surgery

## 2015-10-10 ENCOUNTER — Ambulatory Visit (INDEPENDENT_AMBULATORY_CARE_PROVIDER_SITE_OTHER): Payer: Self-pay | Admitting: Cardiothoracic Surgery

## 2015-10-10 ENCOUNTER — Ambulatory Visit
Admission: RE | Admit: 2015-10-10 | Discharge: 2015-10-10 | Disposition: A | Discharge status: Home / Self Care | Payer: Medicare Other | Source: Ambulatory Visit | Attending: Cardiothoracic Surgery | Admitting: Cardiothoracic Surgery

## 2015-10-10 ENCOUNTER — Encounter: Payer: Self-pay | Admitting: Cardiothoracic Surgery

## 2015-10-10 VITALS — BP 148/68 | HR 80 | Resp 20 | Ht 72.0 in | Wt 212.0 lb

## 2015-10-10 DIAGNOSIS — Z951 Presence of aortocoronary bypass graft: Secondary | ICD-10-CM

## 2015-10-10 DIAGNOSIS — Z09 Encounter for follow-up examination after completed treatment for conditions other than malignant neoplasm: Secondary | ICD-10-CM

## 2015-10-10 MED ORDER — FUROSEMIDE 40 MG PO TABS: 40.0000 mg | ORAL_TABLET | Freq: Every day | ORAL | Status: DC

## 2015-10-10 MED ORDER — POTASSIUM CHLORIDE ER 10 MEQ PO TBCR: 20.0000 meq | EXTENDED_RELEASE_TABLET | Freq: Every day | ORAL | Status: DC

## 2015-10-10 NOTE — Progress Notes (Signed)
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 SURGICAL PROCEDURE: Coronary artery bypass grafting x4 with left internal mammary to the left anterior descending coronary artery, sequential reverse saphenous vein graft to the highest diagonal and first obtuse marginal, reverse saphenous vein graft to the posterior descending with right thigh greater saphenous endo vein harvesting. SURGEON: Lanelle Bal, MD.  History of Present Illness:     Patient returns to the office for early follow-up for wound check. Before discharge he had had some bruising around the lower portion of the sternal incision without fever or chills or definite infection he was discharged home on Keflex which she's been taking. Since being home he is had no fever chills. He's increasing his physical activity appropriately. He notes no drainage from his incision.   Past Surgical History  Procedure Laterality Date  . Right hand fracture  1969  . Left ankle ganglion cyst  1976  . Lumbar disc surgery  1981, 2004  . Nasal sinus surgery      multiple- x8. last 1997 with obliteration  . Left cataract  1996    right - 1997  . Left foot surgery  1998    R surgery/fracture - 1999  . Left cts  2006    R CTS - 2007  . Left wrist/hand fusion  2008  . L foot open repair jones fracture  2010     5th metetarsal   . Rotator cuff repair  2006    R, than repeat 2011, Dr Theda Sers  . Vocal surgery  1996  . Repair thoracic aorta    . Trigger finger release Left 11/11/2014    Procedure: LEFT LONG FINGER RELEASE TRIGGER  FINGER/A-1 PULLEY;  Surgeon: Milly Jakob, MD;  Location: [facility];  Service: Orthopedics;  Laterality: Left;  Marland Kitchen Eye surgery    . Cardiac catheterization N/A 09/19/2015    Procedure: Left Heart Cath and Coronary Angiography;  Surgeon: Adrian Prows, MD;  Location: [facility];  Service: Cardiovascular;  Laterality: N/A;  . Coronary artery bypass graft N/A 09/22/2015    Procedure: CORONARY ARTERY BYPASS GRAFTING (CABG) times four using the right greater saphenous vein harvested endoscopically and the left internal mammary artery.  LIMA-LAD, SEQ SVG-DIAG & OM, SVG-PD.;  Surgeon: [name], MD;  Location: [facility];  Service: Open Heart Surgery;  Laterality: N/A;    Past Medical History  Diagnosis Date  . DJD (degenerative joint disease)     L wrist  . Lumbar disc disease   . HTN (hypertension)   . Anemia     NOS  . Osteoporosis     tx per [facility], Dr Prudencio Burly, thought due to heavy steriod use after 1978  . Spine fracture     hx, multiple  . Blindness of left eye     near blindness. 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 GERD (gastroesophageal reflux disease)   . DM2 (diabetes mellitus, type 2) ([place])   . Anxiety   . Depression   . BPH (benign prostatic hypertrophy)   .  HLD (hyperlipidemia)   . Chronic pain syndrome   . Chronic hyponatremia   . Diabetes mellitus   . Broken foot Oct. 12, 2013    Right foot Fx  . Varicose veins      History  Smoking status  . Former Smoker -- 2.00 packs/day  . Types: Cigarettes  . Quit date: 11/06/1988  Smokeless tobacco  . Never Used    History  Alcohol Use No     Allergies  Allergen Reactions  . Ivp Dye [Iodinated Diagnostic Agents]     Blood Pressure dropped----pt was pre-medicated with 13 hour prep and did fine with pre-meds--amy 03/09/11   . Penicillins Nausea Only    REACTION: gi upset  . Sulfonamide Derivatives Hives and Itching  . Sulfa Antibiotics Hives  . Propoxyphene N-Acetaminophen Other (See Comments)    Sharp pains-  headache    Current Outpatient Prescriptions  Medication Sig Dispense Refill  . amiodarone (PACERONE) 400 MG tablet Take 1 tablet (400 mg total) by mouth daily. 30 tablet 1  . aspirin EC 325 MG EC tablet Take 1 tablet (325 mg total) by mouth daily.    Marland Kitchen atorvastatin (LIPITOR) 80 MG tablet Take 1 tablet (80 mg total) by mouth daily at 6 (six) AM. 30 tablet 1  . BAYER BREEZE 2 TEST DISK USE TO TEST BLOOD GLUCOSE THREE TIMES DAILY 100 each 3  . Blood Glucose Monitoring Suppl (BAYER BREEZE 2 SYSTEM) W/DEVICE KIT Use to check blood sugars once a day 1 kit 0  . buPROPion (WELLBUTRIN XL) 300 MG 24 hr tablet Take 300 mg by mouth every morning.     . carvedilol (COREG) 6.25 MG tablet Take 1 tablet by mouth daily.    . cephALEXin (KEFLEX) 500 MG capsule Take 1 capsule (500 mg total) by mouth every 8 (eight) hours. 21 capsule 0  . Cholecalciferol (VITAMIN D) 1000 UNITS capsule Take 5,000 Units by mouth daily.     . clonazePAM (KLONOPIN) 1 MG tablet Take 1 mg by mouth 3 (three) times daily.     . fentaNYL (DURAGESIC - DOSED MCG/HR) 100 MCG/HR Place 1 patch onto the skin every 3 (three) days.    Marland Kitchen lisinopril (PRINIVIL,ZESTRIL) 2.5 MG tablet Take 1 tablet (2.5 mg total) by mouth daily. 30 tablet 1  . MICROLET LANCETS MISC     . omeprazole (PRILOSEC) 40 MG capsule Take 1 capsule by mouth 2 (two) times daily.    Marland Kitchen oxyCODONE (OXY IR/ROXICODONE) 5 MG immediate release tablet Take 1-2 tablets (5-10 mg total) by mouth every 4 (four) hours as needed for severe pain. 50 tablet 0  . PROAIR HFA 108 (90 BASE) MCG/ACT inhaler     . sitaGLIPtin (JANUVIA) 100 MG tablet Take 1 tablet (100 mg total) by mouth daily. 30 tablet 3  . tamsulosin (FLOMAX) 0.4 MG CAPS capsule Take 1 capsule by mouth 2 (two) times daily.    Marland Kitchen VITAMIN E PO Take 8,000 Units by mouth daily.     No current facility-administered medications for this visit.       Physical Exam: BP 148/68 mmHg  Pulse 80  Resp 20  Ht 6' (1.829 m)  Wt 212 lb  (96.163 kg)  BMI 28.75 kg/m2  SpO2 95%  General appearance: alert, cooperative and no distress Neurologic: intact Heart: regular rate and rhythm, S1, S2 normal, no murmur, click, rub or gallop Lungs: clear to auscultation bilaterally Abdomen: soft, non-tender; bowel sounds normal; no masses,  no organomegaly Extremities: extremities  normal, atraumatic, no cyanosis or edema and Homans sign is negative, no sign of DVT Wound: Incisions little change from time of discharge, sternum is stable without movement, is no significant drainage from the incision, there is some bruising around the lower portion of the incision, and potential suture reaction. A lower incision was doing with Betadine and a tuberculin syringe was used to aspirate no fluid was returned, a few drops of serous solution was expressed from the site of the needlestick and culture.   Diagnostic Studies & Laboratory data:     Recent Radiology Findings:    Dg Chest 2 View  10/10/2015  CLINICAL DATA:  CABG 4 weeks ago with mild chest discomfort. Former smoker. EXAM: CHEST  2 VIEW COMPARISON:  09/25/2015; 09/23/2015; 03/20/2011 FINDINGS: Grossly unchanged cardiac silhouette and mediastinal contours post median sternotomy and CABG. Improved aeration of the lungs with persistent minimal perihilar linear heterogeneous opacities favored to represent atelectasis or scar. No new focal airspace opacities. No pleural effusion or pneumothorax. No evidence of edema. No acute osseous abnormalities. IMPRESSION: 1.  No acute cardiopulmonary disease. 2. Improved aeration of the lungs with persistent perihilar atelectasis/scar. Electronically Signed   By: Sandi Mariscal M.D.   On: 10/10/2015 13:42      Recent Lab Findings: Lab Results  Component Value Date   WBC 6.8 09/29/2015   HGB 8.8* 09/29/2015   HCT 27.4* 09/29/2015   PLT 336 09/29/2015   GLUCOSE 181* 09/27/2015   CHOL 155 11/15/2014   TRIG 85.0 11/15/2014   HDL 39.80 11/15/2014   LDLDIRECT  108.2 03/13/2010   LDLCALC 98 11/15/2014   ALT 23 09/21/2015   AST 29 09/21/2015   NA 127* 09/27/2015   K 4.0 09/27/2015   CL 91* 09/27/2015   CREATININE 0.52* 09/27/2015   BUN 8 09/27/2015   CO2 28 09/27/2015   TSH 2.388 09/25/2015   INR 1.37 09/22/2015   HGBA1C 7.0* 09/21/2015      Assessment / Plan:   Patient returns for follow-up wound check, he's had no fever or chills, has a few days Keflex left Cultural aspiration of the wound last week had no gross Sternum remained stable and overall the wound appears better He does have pedal edema and was started on Lasix 40 mg a day for 14 days and potassium 20 mEq a day for 14 days He'll return to the office in one week for wound check with nurse and follow-up office visit in 2 weeks Since I last saw him his son from [place] who had been staying with them committed suicide at the patient's apartment He notes the funeral home in [place] was today but he was unable to go  [name] MD      [address] [place] [address] Office [phone]   Beeper [phone]  10/10/2015 2:10 PM

## 2015-10-17 ENCOUNTER — Ambulatory Visit (INDEPENDENT_AMBULATORY_CARE_PROVIDER_SITE_OTHER): Payer: Self-pay

## 2015-10-17 ENCOUNTER — Ambulatory Visit: Payer: PRIVATE HEALTH INSURANCE

## 2015-10-17 DIAGNOSIS — Z951 Presence of aortocoronary bypass graft: Secondary | ICD-10-CM

## 2015-10-23 ENCOUNTER — Other Ambulatory Visit: Payer: Self-pay | Admitting: Cardiothoracic Surgery

## 2015-10-23 ENCOUNTER — Ambulatory Visit: Payer: Medicare Other | Admitting: Cardiothoracic Surgery

## 2015-10-23 DIAGNOSIS — Z951 Presence of aortocoronary bypass graft: Secondary | ICD-10-CM

## 2015-10-27 ENCOUNTER — Encounter: Payer: Self-pay | Admitting: Cardiothoracic Surgery

## 2015-10-27 ENCOUNTER — Ambulatory Visit (INDEPENDENT_AMBULATORY_CARE_PROVIDER_SITE_OTHER): Payer: Self-pay | Admitting: Cardiothoracic Surgery

## 2015-10-27 ENCOUNTER — Ambulatory Visit
Admission: RE | Admit: 2015-10-27 | Discharge: 2015-10-27 | Disposition: A | Discharge status: Home / Self Care | Payer: PRIVATE HEALTH INSURANCE | Source: Ambulatory Visit | Attending: Cardiothoracic Surgery | Admitting: Cardiothoracic Surgery

## 2015-10-27 VITALS — BP 144/65 | HR 86 | Resp 18 | Ht 72.0 in | Wt 205.0 lb

## 2015-10-27 DIAGNOSIS — Z951 Presence of aortocoronary bypass graft: Secondary | ICD-10-CM

## 2015-10-27 NOTE — Progress Notes (Signed)
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 SURGICAL PROCEDURE: Coronary artery bypass grafting x4 with left internal mammary to the left anterior descending coronary artery, sequential reverse saphenous vein graft to the highest diagonal and first obtuse marginal, reverse saphenous vein graft to the posterior descending with right thigh greater saphenous endo vein harvesting. SURGEON: Lanelle Bal, MD.  History of Present Illness:     Patient returns to the office for  follow-up  check. . He's increasing his physical activity appropriately. He notes no drainage from his incision.   Past Surgical History  Procedure Laterality Date  . Right hand fracture  1969  . Left ankle ganglion cyst  1976  . Lumbar disc surgery  1981, 2004  . Nasal sinus surgery      multiple- x8. last 1997 with obliteration  . Left cataract  1996    right - 1997  . Left foot surgery  1998    R surgery/fracture - 1999  . Left cts  2006    R CTS - 2007  . Left wrist/hand fusion  2008  . L foot open repair jones fracture  2010     5th metetarsal   . Rotator cuff repair  2006    R, than repeat 2011, Dr Theda Sers  . Vocal surgery  1996  . Repair thoracic aorta    . Trigger finger release Left 11/11/2014    Procedure: LEFT LONG FINGER RELEASE TRIGGER FINGER/A-1 PULLEY;  Surgeon: Milly Jakob, MD;  Location: [facility];  Service: Orthopedics;  Laterality: Left;  Marland Kitchen Eye surgery    . Cardiac catheterization N/A 09/19/2015    Procedure: Left Heart Cath and Coronary Angiography;   Surgeon: Adrian Prows, MD;  Location: [facility];  Service: Cardiovascular;  Laterality: N/A;  . Coronary artery bypass graft N/A 09/22/2015    Procedure: CORONARY ARTERY BYPASS GRAFTING (CABG) times four using the right greater saphenous vein harvested endoscopically and the left internal mammary artery.  LIMA-LAD, SEQ SVG-DIAG & OM, SVG-PD.;  Surgeon: [name], MD;  Location: [facility];  Service: Open Heart Surgery;  Laterality: N/A;    Past Medical History  Diagnosis Date  . DJD (degenerative joint disease)     L wrist  . Lumbar disc disease   . HTN (hypertension)   . Anemia     NOS  . Osteoporosis     tx per [facility], Dr Prudencio Burly, thought due to heavy steriod use after 1978  . Spine fracture     hx, multiple  . Blindness of left eye     near blindness. 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 GERD (gastroesophageal reflux disease)   . DM2 (diabetes mellitus, type 2) ([place])   . Anxiety   . Depression   . BPH (benign prostatic hypertrophy)   . HLD (hyperlipidemia)   . Chronic pain syndrome   . Chronic hyponatremia   . Diabetes mellitus   . Broken foot Oct. 12, 2013    Right foot Fx  . Varicose veins  History  Smoking status  . Former Smoker -- 2.00 packs/day  . Types: Cigarettes  . Quit date: 11/06/1988  Smokeless tobacco  . Never Used    History  Alcohol Use No     Allergies  Allergen Reactions  . Ivp Dye [Iodinated Diagnostic Agents]     Blood Pressure dropped----pt was pre-medicated with 13 hour prep and did fine with pre-meds--amy 03/09/11   . Penicillins Nausea Only    REACTION: gi upset  . Sulfonamide Derivatives Hives and Itching  . Sulfa Antibiotics Hives  . Propoxyphene N-Acetaminophen Other (See Comments)    Sharp pains- headache    Current Outpatient Prescriptions  Medication Sig Dispense Refill  . amiodarone (PACERONE) 400 MG tablet Take 1 tablet (400 mg total) by mouth daily. 30 tablet 1  . aspirin EC 325 MG EC tablet Take 1 tablet (325 mg total) by mouth  daily.    Marland Kitchen atorvastatin (LIPITOR) 80 MG tablet Take 1 tablet (80 mg total) by mouth daily at 6 (six) AM. 30 tablet 1  . BAYER BREEZE 2 TEST DISK USE TO TEST BLOOD GLUCOSE THREE TIMES DAILY 100 each 3  . Blood Glucose Monitoring Suppl (BAYER BREEZE 2 SYSTEM) W/DEVICE KIT Use to check blood sugars once a day 1 kit 0  . buPROPion (WELLBUTRIN XL) 300 MG 24 hr tablet Take 300 mg by mouth every morning.     . carvedilol (COREG) 6.25 MG tablet Take 1 tablet by mouth daily.    . Cholecalciferol (VITAMIN D) 1000 UNITS capsule Take 5,000 Units by mouth daily.     . clonazePAM (KLONOPIN) 1 MG tablet Take 1 mg by mouth 3 (three) times daily.     . fentaNYL (DURAGESIC - DOSED MCG/HR) 100 MCG/HR Place 1 patch onto the skin every 3 (three) days.    . furosemide (LASIX) 40 MG tablet Take 1 tablet (40 mg total) by mouth daily. 14 tablet 0  . lisinopril (PRINIVIL,ZESTRIL) 2.5 MG tablet Take 1 tablet (2.5 mg total) by mouth daily. 30 tablet 1  . oxyCODONE (ROXICODONE) 15 MG immediate release tablet Take 15-30 mg by mouth every 4 (four) hours as needed for pain.    . potassium chloride (K-DUR) 10 MEQ tablet Take 2 tablets (20 mEq total) by mouth daily. 30 tablet 0  . PROAIR HFA 108 (90 BASE) MCG/ACT inhaler     . sitaGLIPtin (JANUVIA) 100 MG tablet Take 1 tablet (100 mg total) by mouth daily. 30 tablet 3  . tamsulosin (FLOMAX) 0.4 MG CAPS capsule Take 1 capsule by mouth 2 (two) times daily.    Marland Kitchen VITAMIN E PO Take 8,000 Units by mouth daily.    Marland Kitchen MICROLET LANCETS MISC     . omeprazole (PRILOSEC) 40 MG capsule Take 1 capsule by mouth 2 (two) times daily.     No current facility-administered medications for this visit.       Physical Exam: BP 144/65 mmHg  Pulse 86  Resp 18  Ht 6' (1.829 m)  Wt 205 lb (92.987 kg)  BMI 27.80 kg/m2  SpO2 96%  General appearance: alert, cooperative and no distress Neurologic: intact Heart: regular rate and rhythm, S1, S2 normal, no murmur, click, rub or gallop Lungs:  clear to auscultation bilaterally Abdomen: soft, non-tender; bowel sounds normal; no masses,  no organomegaly Extremities: extremities normal, atraumatic, no cyanosis or edema and Homans sign is negative, no sign of DVT Wound: Incisions little change from time of discharge, sternum is stable without movement, is no  significant drainage from the incision, there is some bruising around the lower portion of the incision, and potential suture reaction. A lower incision was doing with Betadine and a tuberculin syringe was used to aspirate no fluid was returned, a few drops of serous solution was expressed from the site of the needlestick and culture.   Diagnostic Studies & Laboratory data:     Recent Radiology Findings:    Dg Chest 2 View  10/27/2015  CLINICAL DATA:  CABG.  Short of breath but EXAM: CHEST  2 VIEW COMPARISON:  10/10/2015 FINDINGS: Normal heart size. Lungs are under aerated with minimal subsegmental atelectasis at the bases. Upper lungs clear. No pneumothorax or pleural effusion. Normal vascularity. Focal opacity projecting over the medial right clavicle is stable. IMPRESSION: No active cardiopulmonary disease. Electronically Signed   By: Arthur  Hoss M.D.   On: 10/27/2015 17:04      Recent Lab Findings: Lab Results  Component Value Date   WBC 6.8 09/29/2015   HGB 8.8* 09/29/2015   HCT 27.4* 09/29/2015   PLT 336 09/29/2015   GLUCOSE 181* 09/27/2015   CHOL 155 11/15/2014   TRIG 85.0 11/15/2014   HDL 39.80 11/15/2014   LDLDIRECT 108.2 03/13/2010   LDLCALC 98 11/15/2014   ALT 23 09/21/2015   AST 29 09/21/2015   NA 127* 09/27/2015   K 4.0 09/27/2015   CL 91* 09/27/2015   CREATININE 0.52* 09/27/2015   BUN 8 09/27/2015   CO2 28 09/27/2015   TSH 2.388 09/25/2015   INR 1.37 09/22/2015   HGBA1C 7.0* 09/21/2015      Assessment / Plan:   Patient returns for follow-up wound check, he's had no fever or chills, off Keflex Sternum remains stable and overall the wound appears  better He'll return to the office in 4 weeks for wound check     B  MD      [address] [place] [address] Office [phone]   Beeper [phone]  10/27/2015 5:35 PM      

## 2015-11-06 ENCOUNTER — Encounter (HOSPITAL_COMMUNITY): Payer: Self-pay | Admitting: *Deleted

## 2015-11-06 ENCOUNTER — Other Ambulatory Visit: Payer: Self-pay | Admitting: *Deleted

## 2015-11-06 ENCOUNTER — Emergency Department (HOSPITAL_COMMUNITY)
Admission: EM | Admit: 2015-11-06 | Discharge: 2015-11-06 | Disposition: A | Discharge status: Home / Self Care | Payer: Medicare Other | Attending: Emergency Medicine | Admitting: Emergency Medicine

## 2015-11-06 ENCOUNTER — Emergency Department (HOSPITAL_COMMUNITY): Payer: Medicare Other

## 2015-11-06 DIAGNOSIS — Z87891 Personal history of nicotine dependence: Secondary | ICD-10-CM | POA: Diagnosis not present

## 2015-11-06 DIAGNOSIS — F329 Major depressive disorder, single episode, unspecified: Secondary | ICD-10-CM | POA: Insufficient documentation

## 2015-11-06 DIAGNOSIS — N400 Enlarged prostate without lower urinary tract symptoms: Secondary | ICD-10-CM | POA: Diagnosis not present

## 2015-11-06 DIAGNOSIS — I10 Essential (primary) hypertension: Secondary | ICD-10-CM | POA: Diagnosis not present

## 2015-11-06 DIAGNOSIS — R079 Chest pain, unspecified: Secondary | ICD-10-CM | POA: Insufficient documentation

## 2015-11-06 DIAGNOSIS — F419 Anxiety disorder, unspecified: Secondary | ICD-10-CM | POA: Insufficient documentation

## 2015-11-06 DIAGNOSIS — G894 Chronic pain syndrome: Secondary | ICD-10-CM | POA: Insufficient documentation

## 2015-11-06 DIAGNOSIS — Z951 Presence of aortocoronary bypass graft: Secondary | ICD-10-CM | POA: Insufficient documentation

## 2015-11-06 DIAGNOSIS — K219 Gastro-esophageal reflux disease without esophagitis: Secondary | ICD-10-CM | POA: Insufficient documentation

## 2015-11-06 DIAGNOSIS — E785 Hyperlipidemia, unspecified: Secondary | ICD-10-CM | POA: Diagnosis not present

## 2015-11-06 DIAGNOSIS — Z7982 Long term (current) use of aspirin: Secondary | ICD-10-CM | POA: Insufficient documentation

## 2015-11-06 DIAGNOSIS — Z9889 Other specified postprocedural states: Secondary | ICD-10-CM | POA: Diagnosis not present

## 2015-11-06 DIAGNOSIS — Z8781 Personal history of (healed) traumatic fracture: Secondary | ICD-10-CM | POA: Diagnosis not present

## 2015-11-06 DIAGNOSIS — D649 Anemia, unspecified: Secondary | ICD-10-CM | POA: Insufficient documentation

## 2015-11-06 DIAGNOSIS — Z79899 Other long term (current) drug therapy: Secondary | ICD-10-CM | POA: Diagnosis not present

## 2015-11-06 DIAGNOSIS — H5442 Blindness, left eye, normal vision right eye: Secondary | ICD-10-CM | POA: Diagnosis not present

## 2015-11-06 DIAGNOSIS — E119 Type 2 diabetes mellitus without complications: Secondary | ICD-10-CM | POA: Insufficient documentation

## 2015-11-06 DIAGNOSIS — Z880 Allergy status to penicillin: Secondary | ICD-10-CM | POA: Diagnosis not present

## 2015-11-06 DIAGNOSIS — Z87828 Personal history of other (healed) physical injury and trauma: Secondary | ICD-10-CM | POA: Insufficient documentation

## 2015-11-06 DIAGNOSIS — Z79891 Long term (current) use of opiate analgesic: Secondary | ICD-10-CM | POA: Insufficient documentation

## 2015-11-06 LAB — CBC
HCT: 31.5 % — ABNORMAL LOW (ref 39.0–52.0)
Hemoglobin: 10.1 g/dL — ABNORMAL LOW (ref 13.0–17.0)
MCH: 26.8 pg (ref 26.0–34.0)
MCHC: 32.1 g/dL (ref 30.0–36.0)
MCV: 83.6 fL (ref 78.0–100.0)
PLATELETS: 275 10*3/uL (ref 150–400)
RBC: 3.77 MIL/uL — ABNORMAL LOW (ref 4.22–5.81)
RDW: 14.2 % (ref 11.5–15.5)
WBC: 4.2 10*3/uL (ref 4.0–10.5)

## 2015-11-06 LAB — I-STAT TROPONIN, ED: TROPONIN I, POC: 0 ng/mL (ref 0.00–0.08)

## 2015-11-06 LAB — BASIC METABOLIC PANEL
Anion gap: 6 (ref 5–15)
BUN: 9 mg/dL (ref 6–20)
CO2: 27 mmol/L (ref 22–32)
Calcium: 8.8 mg/dL — ABNORMAL LOW (ref 8.9–10.3)
Chloride: 97 mmol/L — ABNORMAL LOW (ref 101–111)
Creatinine, Ser: 0.83 mg/dL (ref 0.61–1.24)
Glucose, Bld: 138 mg/dL — ABNORMAL HIGH (ref 65–99)
Potassium: 3.9 mmol/L (ref 3.5–5.1)
SODIUM: 130 mmol/L — AB (ref 135–145)

## 2015-11-06 LAB — TROPONIN I: Troponin I: <0.03 ng/mL (ref <0.031)

## 2015-11-06 MED ORDER — HYDROMORPHONE HCL 1 MG/ML IJ SOLN
1.0000 mg | Freq: Once | INTRAMUSCULAR | Status: AC
  Administered 2015-11-06: 1 mg via INTRAMUSCULAR
  Filled 2015-11-06: qty 1

## 2015-11-06 NOTE — Discharge Instructions (Signed)
Nonspecific Chest Pain  °Chest pain can be caused by many different conditions. There is always a chance that your pain could be related to something serious, such as a heart attack or a blood clot in your lungs. Chest pain can also be caused by conditions that are not life-threatening. If you have chest pain, it is very important to follow up with your health care provider. °CAUSES  °Chest pain can be caused by: °· Heartburn. °· Pneumonia or bronchitis. °· Anxiety or stress. °· Inflammation around your heart (pericarditis) or lung (pleuritis or pleurisy). °· A blood clot in your lung. °· A collapsed lung (pneumothorax). It can develop suddenly on its own (spontaneous pneumothorax) or from trauma to the chest. °· Shingles infection (varicella-zoster virus). °· Heart attack. °· Damage to the bones, muscles, and cartilage that make up your chest wall. This can include: °¨ Bruised bones due to injury. °¨ Strained muscles or cartilage due to frequent or repeated coughing or overwork. °¨ Fracture to one or more ribs. °¨ Sore cartilage due to inflammation (costochondritis). °RISK FACTORS  °Risk factors for chest pain may include: °· Activities that increase your risk for trauma or injury to your chest. °· Respiratory infections or conditions that cause frequent coughing. °· Medical conditions or overeating that can cause heartburn. °· Heart disease or family history of heart disease. °· Conditions or health behaviors that increase your risk of developing a blood clot. °· Having had chicken pox (varicella zoster). °SIGNS AND SYMPTOMS °Chest pain can feel like: °· Burning or tingling on the surface of your chest or deep in your chest. °· Crushing, pressure, aching, or squeezing pain. °· Dull or sharp pain that is worse when you move, cough, or take a deep breath. °· Pain that is also felt in your back, neck, shoulder, or arm, or pain that spreads to any of these areas. °Your chest pain may come and go, or it may stay  constant. °DIAGNOSIS °Lab tests or other studies may be needed to find the cause of your pain. Your health care provider may have you take a test called an ambulatory ECG (electrocardiogram). An ECG records your heartbeat patterns at the time the test is performed. You may also have other tests, such as: °· Transthoracic echocardiogram (TTE). During echocardiography, sound waves are used to create a picture of all of the heart structures and to look at how blood flows through your heart. °· Transesophageal echocardiogram (TEE). This is a more advanced imaging test that obtains images from inside your body. It allows your health care provider to see your heart in finer detail. °· Cardiac monitoring. This allows your health care provider to monitor your heart rate and rhythm in real time. °· Holter monitor. This is a portable device that records your heartbeat and can help to diagnose abnormal heartbeats. It allows your health care provider to track your heart activity for several days, if needed. °· Stress tests. These can be done through exercise or by taking medicine that makes your heart beat more quickly. °· Blood tests. °· Imaging tests. °TREATMENT  °Your treatment depends on what is causing your chest pain. Treatment may include: °· Medicines. These may include: °¨ Acid blockers for heartburn. °¨ Anti-inflammatory medicine. °¨ Pain medicine for inflammatory conditions. °¨ Antibiotic medicine, if an infection is present. °¨ Medicines to dissolve blood clots. °¨ Medicines to treat coronary artery disease. °· Supportive care for conditions that do not require medicines. This may include: °¨ Resting. °¨ Applying heat   or cold packs to injured areas. °¨ Limiting activities until pain decreases. °HOME CARE INSTRUCTIONS °· If you were prescribed an antibiotic medicine, finish it all even if you start to feel better. °· Avoid any activities that bring on chest pain. °· Do not use any tobacco products, including  cigarettes, chewing tobacco, or electronic cigarettes. If you need help quitting, ask your health care provider. °· Do not drink alcohol. °· Take medicines only as directed by your health care provider. °· Keep all follow-up visits as directed by your health care provider. This is important. This includes any further testing if your chest pain does not go away. °· If heartburn is the cause for your chest pain, you may be told to keep your head raised (elevated) while sleeping. This reduces the chance that acid will go from your stomach into your esophagus. °· Make lifestyle changes as directed by your health care provider. These may include: °¨ Getting regular exercise. Ask your health care provider to suggest some activities that are safe for you. °¨ Eating a heart-healthy diet. A registered dietitian can help you to learn healthy eating options. °¨ Maintaining a healthy weight. °¨ Managing diabetes, if necessary. °¨ Reducing stress. °SEEK MEDICAL CARE IF: °· Your chest pain does not go away after treatment. °· You have a rash with blisters on your chest. °· You have a fever. °SEEK IMMEDIATE MEDICAL CARE IF:  °· Your chest pain is worse. °· You have an increasing cough, or you cough up blood. °· You have severe abdominal pain. °· You have severe weakness. °· You faint. °· You have chills. °· You have sudden, unexplained chest discomfort. °· You have sudden, unexplained discomfort in your arms, back, neck, or jaw. °· You have shortness of breath at any time. °· You suddenly start to sweat, or your skin gets clammy. °· You feel nauseous or you vomit. °· You suddenly feel light-headed or dizzy. °· Your heart begins to beat quickly, or it feels like it is skipping beats. °These symptoms may represent a serious problem that is an emergency. Do not wait to see if the symptoms will go away. Get medical help right away. Call your local emergency services (911 in the [place] Do not drive yourself to the hospital. °  °This  information is not intended to replace advice given to you by your health care provider. Make sure you discuss any questions you have with your health care provider. °  °Document Released: 05/12/2005 Document Revised: 08/23/2014 Document Reviewed: 03/08/2014 °Elsevier Interactive Patient Education ©2016 [employer] ° °

## 2015-11-06 NOTE — ED Notes (Signed)
Pt reports constant chest pain that goes from rt to left chest. Pt reports SOB as well. Pt reports that pain and SOB are worse with movement. Pt noted to be diaphoretic at triage.

## 2015-11-07 ENCOUNTER — Other Ambulatory Visit: Payer: Self-pay | Admitting: *Deleted

## 2015-11-07 LAB — CBG MONITORING, ED: Glucose-Capillary: 143 mg/dL — ABNORMAL HIGH (ref 65–99)

## 2015-11-11 NOTE — ED Provider Notes (Signed)
CSN: [id number]     Arrival date & time 11/06/15  1636 History   First MD Initiated Contact with Patient 11/06/15 [place]     Chief Complaint  Patient presents with  . Chest Pain     (Consider location/radiation/quality/duration/timing/severity/associated sxs/prior Treatment) HPI  70 year old male with chest pain. Triage history reviewed but history provided to me is different than noted there. Patient reports that he's been having right-sided chest pain since last night. He denies that it radiates. It is worse with movement In general but not necessarily exertion. He has a history of CAD with recent CABG. He states that thispain is new since his surgery though.He reports that it feels different thanprior anginal symptoms.Denies any fevers or chills. No cough.No unusual leg pain or swelling. He reports compliance with his medications.Denies any trauma/strain.   Past Medical History  Diagnosis Date  . DJD (degenerative joint disease)     L wrist  . Lumbar disc disease   . HTN (hypertension)   . Anemia     NOS  . Osteoporosis     tx per [facility], Dr Prudencio Burly, thought due to heavy steriod use after 1978  . Spine fracture     hx, multiple  . Blindness of left eye     near blindness. 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 GERD (gastroesophageal reflux disease)   . DM2 (diabetes mellitus, type 2) ([place] [facility])   . Anxiety   . Depression   . BPH (benign prostatic hypertrophy)   . HLD (hyperlipidemia)   . Chronic pain syndrome   . Chronic hyponatremia   . Diabetes mellitus   . Broken foot Oct. 12, 2013    Right foot Fx  . Varicose veins    Past Surgical History  Procedure Laterality Date  . Right hand fracture  1969  . Left ankle ganglion cyst  1976  . Lumbar disc surgery  1981, 2004  . Nasal sinus surgery      multiple- x8. last 1997 with obliteration  . Left cataract  1996    right - 1997  . Left foot surgery  1998    R surgery/fracture - 1999  . Left cts  2006    R CTS - 2007  . Left wrist/hand fusion  2008   . L foot open repair jones fracture  2010     5th metetarsal   . Rotator cuff repair  2006    R, than repeat 2011, Dr Theda Sers  . Vocal surgery  1996  . Repair thoracic aorta    . Trigger finger release Left 11/11/2014    Procedure: LEFT LONG FINGER RELEASE TRIGGER FINGER/A-1 PULLEY;  Surgeon: Milly Jakob, MD;  Location: [facility];  Service: Orthopedics;  Laterality: Left;  Marland Kitchen Eye surgery    . Cardiac catheterization N/A 09/19/2015    Procedure: Left Heart Cath and Coronary Angiography;  Surgeon: Adrian Prows, MD;  Location: [facility];  Service: Cardiovascular;  Laterality: N/A;  . Coronary artery bypass graft N/A 09/22/2015    Procedure: CORONARY ARTERY BYPASS GRAFTING (CABG) times four using the right greater saphenous vein harvested endoscopically and the left internal mammary artery.  LIMA-LAD, SEQ SVG-DIAG & OM, SVG-PD.;  Surgeon: [name], MD;  Location: [facility];  Service: Open Heart Surgery;  Laterality: N/A;   Family History  Problem Relation Age of Onset  . Lung cancer Father     and grandmother  . Throat cancer      family hx - "  bad living" - also lung CA, heart disease and renal failure    . Diabetes Mother     DM, also sister and MGF    Social History  Substance Use Topics  . Smoking status: Former Smoker -- 2.00 packs/day    Types: Cigarettes    Quit date: 11/06/1988  . Smokeless tobacco: Never Used  . Alcohol Use: No    Review of Systems All systems reviewed and negative, other than as noted in HPI.    Allergies  Ivp dye; Penicillins; Sulfonamide derivatives; Sulfa antibiotics; and Propoxyphene n-acetaminophen  Home Medications   Prior to Admission medications   Medication Sig Start Date End Date Taking? Authorizing Provider  amiodarone (PACERONE) 400 MG tablet Take 1 tablet (400 mg total) by mouth daily. 10/02/15   John Giovanni, PA-C  aspirin EC 325 MG EC tablet Take 1 tablet (325 mg total) by mouth daily. 09/29/15   Wayne E Gold,  PA-C  atorvastatin (LIPITOR) 80 MG tablet Take 1 tablet (80 mg total) by mouth daily at 6 (six) AM. 09/29/15   Wayne E Gold, PA-C  BAYER BREEZE 2 TEST DISK USE TO TEST BLOOD GLUCOSE THREE TIMES DAILY 02/26/15   Elayne Snare, MD  Blood Glucose Monitoring Suppl (BAYER BREEZE 2 SYSTEM) W/DEVICE KIT Use to check blood sugars once a day 04/05/13   Elayne Snare, MD  buPROPion (WELLBUTRIN XL) 300 MG 24 hr tablet Take 300 mg by mouth every morning.  10/12/11   Historical Provider, MD  carvedilol (COREG) 6.25 MG tablet Take 1 tablet by mouth daily. 10/19/10   Historical Provider, MD  Cholecalciferol (VITAMIN D) 1000 UNITS capsule Take 5,000 Units by mouth daily.     Historical Provider, MD  clonazePAM (KLONOPIN) 1 MG tablet Take 1 mg by mouth 3 (three) times daily.     Historical Provider, MD  fentaNYL (DURAGESIC - DOSED MCG/HR) 100 MCG/HR Place 1 patch onto the skin every 3 (three) days. 07/28/09   Historical Provider, MD  lisinopril (PRINIVIL,ZESTRIL) 2.5 MG tablet Take 1 tablet (2.5 mg total) by mouth daily. 09/29/15   John Giovanni, PA-C  MICROLET LANCETS [place]  10/22/12   Historical Provider, MD  omeprazole (PRILOSEC) 40 MG capsule Take 1 capsule by mouth 2 (two) times daily. 09/08/09   Historical Provider, MD  oxyCODONE (ROXICODONE) 15 MG immediate release tablet Take 15-30 mg by mouth every 4 (four) hours as needed for pain.    Historical Provider, MD  PROAIR HFA 108 (90 BASE) MCG/ACT inhaler  03/12/14   Historical Provider, MD  sitaGLIPtin (JANUVIA) 100 MG tablet Take 1 tablet (100 mg total) by mouth daily. 07/22/15   Dixie Dials, MD  tamsulosin (FLOMAX) 0.4 MG CAPS capsule Take 1 capsule by mouth 2 (two) times daily. 12/08/09   Historical Provider, MD  VITAMIN E PO Take 8,000 Units by mouth daily.    Historical Provider, MD   BP 122/80 mmHg  Pulse 63  Temp(Src) 98.2 F (36.8 C) (Oral)  Resp 10  SpO2 96% Physical Exam  Constitutional: He appears well-developed and well-nourished. No distress.  HENT:  Head:  Normocephalic and atraumatic.  Eyes: Conjunctivae are normal. Right eye exhibits no discharge. Left eye exhibits no discharge.  Neck: Neck supple.  Cardiovascular: Normal rate, regular rhythm and normal heart sounds.  Exam reveals no gallop and no friction rub.   No murmur heard. Pulmonary/Chest: Effort normal and breath sounds normal. No respiratory distress.    Point tenderness in depicted area intercostally. Overlying  tissue is unremarkable in appearance.This as well off midline. Sternotomy site appears to be healing well and without apparent complication.Breath sounds are clear bilaterally and symmetric.  Abdominal: Soft. He exhibits no distension. There is no tenderness.  Musculoskeletal: He exhibits no edema or tenderness.  Neurological: He is alert.  Skin: Skin is warm and dry.  Psychiatric: He has a normal mood and affect. His behavior is normal. Thought content normal.  Nursing note and vitals reviewed.   ED Course  Procedures (including critical care time) Labs Review Labs Reviewed  BASIC METABOLIC PANEL - Abnormal; Notable for the following:    Sodium 130 (*)    Chloride 97 (*)    Glucose, Bld 138 (*)    Calcium 8.8 (*)    All other components within normal limits  CBC - Abnormal; Notable for the following:    RBC 3.77 (*)    Hemoglobin 10.1 (*)    HCT 31.5 (*)    All other components within normal limits  CBG MONITORING, ED - Abnormal; Notable for the following:    Glucose-Capillary 143 (*)    All other components within normal limits  TROPONIN I  I-STAT TROPOININ, ED    Imaging Review No results found. I have personally reviewed and evaluated these images and lab results as part of my medical decision-making.   EKG Interpretation   Date/Time:  Thursday November 06 2015 16:45:21 EDT Ventricular Rate:  71 PR Interval:  176 QRS Duration: 110 QT Interval:  412 QTC Calculation: 447 R Axis:   55 Text Interpretation:  rhythm now sinus. atrial fibrillation noted  on  previous.  NS ST changes Confirmed by Wilson Singer  MD, Izak Anding ([id number]) on  11/06/2015 6:27:43 PM      MDM   Final diagnoses:  Chest pain, unspecified chest pain type    59-year-old male with chest pain. Known CAD and recent CABG. His pain seems atypical for cardiac though. His sternotomy site looks fine.Doubt PE or infectious. Initial and repeat troponins normal. Return precautions were discussed. Outpatient follow-up with cardiology and thoracic surgery otherwise.    Virgel Manifold, MD 11/11/15 1208

## 2015-11-18 DIAGNOSIS — I10 Essential (primary) hypertension: Secondary | ICD-10-CM | POA: Diagnosis not present

## 2015-11-18 DIAGNOSIS — I2510 Atherosclerotic heart disease of native coronary artery without angina pectoris: Secondary | ICD-10-CM | POA: Diagnosis not present

## 2015-11-18 DIAGNOSIS — Z87891 Personal history of nicotine dependence: Secondary | ICD-10-CM | POA: Diagnosis not present

## 2015-11-18 DIAGNOSIS — M810 Age-related osteoporosis without current pathological fracture: Secondary | ICD-10-CM | POA: Diagnosis not present

## 2015-11-18 DIAGNOSIS — E119 Type 2 diabetes mellitus without complications: Secondary | ICD-10-CM | POA: Diagnosis not present

## 2015-11-18 DIAGNOSIS — R072 Precordial pain: Secondary | ICD-10-CM | POA: Diagnosis not present

## 2015-11-20 ENCOUNTER — Other Ambulatory Visit: Payer: Self-pay | Admitting: *Deleted

## 2015-11-20 DIAGNOSIS — Z951 Presence of aortocoronary bypass graft: Secondary | ICD-10-CM

## 2015-11-26 ENCOUNTER — Encounter: Payer: Self-pay | Admitting: Surgery

## 2015-11-27 ENCOUNTER — Encounter: Payer: Self-pay | Admitting: Cardiothoracic Surgery

## 2015-11-27 ENCOUNTER — Ambulatory Visit (INDEPENDENT_AMBULATORY_CARE_PROVIDER_SITE_OTHER): Payer: Self-pay | Admitting: Cardiothoracic Surgery

## 2015-11-27 ENCOUNTER — Other Ambulatory Visit: Payer: Self-pay | Admitting: *Deleted

## 2015-11-27 VITALS — BP 92/65 | HR 69 | Resp 16 | Ht 72.0 in | Wt 215.6 lb

## 2015-11-27 DIAGNOSIS — I10 Essential (primary) hypertension: Secondary | ICD-10-CM

## 2015-11-27 DIAGNOSIS — Z951 Presence of aortocoronary bypass graft: Secondary | ICD-10-CM

## 2015-11-27 MED ORDER — LISINOPRIL 2.5 MG PO TABS: 2.5000 mg | ORAL_TABLET | Freq: Every day | ORAL | Status: DC

## 2015-11-27 NOTE — Progress Notes (Signed)
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 SURGICAL PROCEDURE: Coronary artery bypass grafting x4 with left internal mammary to the left anterior descending coronary artery, sequential reverse saphenous vein graft to the highest diagonal and first obtuse marginal, reverse saphenous vein graft to the posterior descending with right thigh greater saphenous endo vein harvesting. SURGEON: Lanelle Bal, MD.  History of Present Illness:     Patient returns to the office for  follow-up  check. . He's increasing his physical activity appropriately. His sternal incision has completely healed. He's increased his physical activity. He did note an episode several days ago of syncope or near syncope while getting out of his car nearby firemen checked on him and encourage him to be seen but he refused.    Past Surgical History  Procedure Laterality Date  . Right hand fracture  1969  . Left ankle ganglion cyst  1976  . Lumbar disc surgery  1981, 2004  . Nasal sinus surgery      multiple- x8. last 1997 with obliteration  . Left cataract  1996    right - 1997  . Left foot surgery  1998    R surgery/fracture - 1999  . Left cts  2006    R CTS - 2007  . Left wrist/hand fusion  2008  . L foot open repair jones fracture  2010     5th metetarsal   . Rotator cuff repair  2006    R, than repeat 2011, Dr Theda Sers  . Vocal surgery  1996  . Repair thoracic aorta    . Trigger finger release Left 11/11/2014    Procedure: LEFT LONG FINGER RELEASE TRIGGER FINGER/A-1 PULLEY;  Surgeon: Milly Jakob, MD;  Location: [facility];  Service: Orthopedics;  Laterality: Left;  Marland Kitchen Eye surgery    . Cardiac catheterization N/A 09/19/2015    Procedure: Left Heart Cath and Coronary Angiography;  Surgeon: Adrian Prows, MD;  Location: [facility];  Service: Cardiovascular;  Laterality: N/A;  . Coronary artery bypass graft N/A 09/22/2015    Procedure: CORONARY ARTERY BYPASS GRAFTING (CABG) times four using the right greater saphenous vein harvested endoscopically and the left internal mammary artery.  LIMA-LAD, SEQ SVG-DIAG & OM, SVG-PD.;  Surgeon: [name], MD;  Location: [facility];  Service: Open Heart Surgery;  Laterality: N/A;    Past Medical History  Diagnosis Date  . DJD (degenerative joint disease)     L wrist  . Lumbar disc disease   . HTN (hypertension)   . Anemia     NOS  . Osteoporosis     tx per [facility], Dr Prudencio Burly, thought due to heavy steriod use after 1978  . Spine fracture     hx, multiple  . Blindness of left eye     near blindness. 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 GERD (gastroesophageal reflux disease)   . DM2 (diabetes mellitus, type 2) ([place])   . Anxiety   . Depression   . BPH (benign prostatic hypertrophy)   .  HLD (hyperlipidemia)   . Chronic pain syndrome   . Chronic hyponatremia   . Diabetes mellitus   . Broken foot Oct. 12, 2013    Right foot Fx  . Varicose veins      History  Smoking status  . Former Smoker -- 2.00 packs/day  . Types: Cigarettes  . Quit date: 11/06/1988  Smokeless tobacco  . Never Used    History  Alcohol Use No     Allergies  Allergen Reactions  . Ivp Dye [Iodinated Diagnostic Agents]     Blood Pressure dropped----pt was pre-medicated with 13 hour prep and did fine with pre-meds--amy 03/09/11   . Penicillins Nausea Only    REACTION: gi upset  . Sulfonamide Derivatives Hives and Itching  . Sulfa Antibiotics Hives  . Propoxyphene N-Acetaminophen Other (See Comments)    Sharp pains- headache    Current Outpatient  Prescriptions  Medication Sig Dispense Refill  . amiodarone (PACERONE) 400 MG tablet Take 1 tablet (400 mg total) by mouth daily. (Patient taking differently: Take 200 mg by mouth daily. ) 30 tablet 1  . atorvastatin (LIPITOR) 80 MG tablet Take 1 tablet (80 mg total) by mouth daily at 6 (six) AM. 30 tablet 1  . BAYER BREEZE 2 TEST DISK USE TO TEST BLOOD GLUCOSE THREE TIMES DAILY 100 each 3  . Blood Glucose Monitoring Suppl (BAYER BREEZE 2 SYSTEM) W/DEVICE KIT Use to check blood sugars once a day 1 kit 0  . buPROPion (WELLBUTRIN XL) 300 MG 24 hr tablet Take 300 mg by mouth every morning.     . carvedilol (COREG) 6.25 MG tablet Take 1 tablet by mouth daily.    . Cholecalciferol (VITAMIN D) 1000 UNITS capsule Take 5,000 Units by mouth daily.     . clonazePAM (KLONOPIN) 1 MG tablet Take 1 mg by mouth 3 (three) times daily.     . clopidogrel (PLAVIX) 75 MG tablet Take 75 mg by mouth daily.    Marland Kitchen MICROLET LANCETS MISC     . omeprazole (PRILOSEC) 40 MG capsule Take 1 capsule by mouth 2 (two) times daily.    Marland Kitchen oxyCODONE (ROXICODONE) 15 MG immediate release tablet Take 15-30 mg by mouth every 4 (four) hours as needed for pain.    Marland Kitchen PROAIR HFA 108 (90 BASE) MCG/ACT inhaler     . sitaGLIPtin (JANUVIA) 100 MG tablet Take 1 tablet (100 mg total) by mouth daily. 30 tablet 3  . tamsulosin (FLOMAX) 0.4 MG CAPS capsule Take 1 capsule by mouth 2 (two) times daily.    Marland Kitchen VITAMIN E PO Take 8,000 Units by mouth daily.    . fentaNYL (DURAGESIC - DOSED MCG/HR) 100 MCG/HR Place 1 patch onto the skin every 3 (three) days.    Marland Kitchen lisinopril (PRINIVIL,ZESTRIL) 2.5 MG tablet Take 1 tablet (2.5 mg total) by mouth daily. 30 tablet 1   No current facility-administered medications for this visit.       Physical Exam: BP 92/65 mmHg  Pulse 69  Resp 16  Ht 6' (1.829 m)  Wt 215 lb 9.6 oz (97.796 kg)  BMI 29.23 kg/m2  SpO2 95% Blood pressure 92/65 in the right arm, simultaneous his blood pressure was159/70 in the left  arm General appearance: alert, cooperative and no distress Neurologic: intact Heart: regular rate and rhythm, S1, S2 normal, no murmur, click, rub or gallop Lungs: clear to auscultation bilaterally Abdomen: soft, non-tender; bowel sounds normal; no masses,  no organomegaly Extremities: extremities normal, atraumatic, no cyanosis or  edema and Homans sign is negative, no sign of DVT Wound: Incisions little change from time of discharge, sternum is stable without movement, is no significant drainage from the incision, there is some bruising around the lower portion of the incision, and potential suture reaction. A lower incision was doing with Betadine and a tuberculin syringe was used to aspirate no fluid was returned, a few drops of serous solution was expressed from the site of the needlestick and culture.   Diagnostic Studies & Laboratory data:     Recent Radiology Findings:    No results found.    Recent Lab Findings: Lab Results  Component Value Date   WBC 4.2 11/06/2015   HGB 10.1* 11/06/2015   HCT 31.5* 11/06/2015   PLT 275 11/06/2015   GLUCOSE 138* 11/06/2015   CHOL 155 11/15/2014   TRIG 85.0 11/15/2014   HDL 39.80 11/15/2014   LDLDIRECT 108.2 03/13/2010   LDLCALC 98 11/15/2014   ALT 23 09/21/2015   AST 29 09/21/2015   NA 130* 11/06/2015   K 3.9 11/06/2015   CL 97* 11/06/2015   CREATININE 0.83 11/06/2015   BUN 9 11/06/2015   CO2 27 11/06/2015   TSH 2.388 09/25/2015   INR 1.37 09/22/2015   HGBA1C 7.0* 09/21/2015      Assessment / Plan:   Sternum remains stable and overall the wound appears better He'll return to the office in 6 weeks for wound check  The patient has known cerebrovascular disease including right subclavian artery stenosis, at the time of his cardiac surgery Dr. early and Dr. Oneida Alar consulted and multiply determined that his cerebral circulation was not in jeopardy. With a recent syncopal episode have made him appointment back for evaluation by  cardiology, he artery has an appointment to see Dr. Trula Slade in approximately a week with follow-up carotid Doppler studies.  [name] MD      [address] [place] [address] Office [phone]   Beeper [phone]  11/27/2015 4:16 PM

## 2015-12-08 ENCOUNTER — Encounter: Payer: Self-pay | Admitting: Surgery

## 2015-12-08 ENCOUNTER — Ambulatory Visit (INDEPENDENT_AMBULATORY_CARE_PROVIDER_SITE_OTHER): Payer: PRIVATE HEALTH INSURANCE | Admitting: Surgery

## 2015-12-08 ENCOUNTER — Ambulatory Visit (HOSPITAL_COMMUNITY)
Admission: RE | Admit: 2015-12-08 | Discharge: 2015-12-08 | Disposition: A | Discharge status: Home / Self Care | Payer: Medicare Other | Source: Ambulatory Visit | Attending: Surgery | Admitting: Surgery

## 2015-12-08 ENCOUNTER — Ambulatory Visit (INDEPENDENT_AMBULATORY_CARE_PROVIDER_SITE_OTHER)
Admission: RE | Admit: 2015-12-08 | Discharge: 2015-12-08 | Disposition: A | Discharge status: Home / Self Care | Payer: Medicare Other | Source: Ambulatory Visit | Attending: Family | Admitting: Family

## 2015-12-08 VITALS — BP 159/77 | HR 65 | Ht 72.0 in | Wt 216.4 lb

## 2015-12-08 DIAGNOSIS — I771 Stricture of artery: Secondary | ICD-10-CM | POA: Diagnosis not present

## 2015-12-08 DIAGNOSIS — I70211 Atherosclerosis of native arteries of extremities with intermittent claudication, right leg: Secondary | ICD-10-CM | POA: Diagnosis not present

## 2015-12-08 DIAGNOSIS — I10 Essential (primary) hypertension: Secondary | ICD-10-CM | POA: Diagnosis not present

## 2015-12-08 DIAGNOSIS — E785 Hyperlipidemia, unspecified: Secondary | ICD-10-CM | POA: Insufficient documentation

## 2015-12-08 DIAGNOSIS — K219 Gastro-esophageal reflux disease without esophagitis: Secondary | ICD-10-CM | POA: Insufficient documentation

## 2015-12-08 DIAGNOSIS — E119 Type 2 diabetes mellitus without complications: Secondary | ICD-10-CM | POA: Insufficient documentation

## 2015-12-08 DIAGNOSIS — I779 Disorder of arteries and arterioles, unspecified: Secondary | ICD-10-CM | POA: Diagnosis not present

## 2015-12-08 DIAGNOSIS — Z87891 Personal history of nicotine dependence: Secondary | ICD-10-CM | POA: Insufficient documentation

## 2015-12-08 DIAGNOSIS — I6523 Occlusion and stenosis of bilateral carotid arteries: Secondary | ICD-10-CM | POA: Diagnosis not present

## 2015-12-08 DIAGNOSIS — R0989 Other specified symptoms and signs involving the circulatory and respiratory systems: Secondary | ICD-10-CM | POA: Diagnosis present

## 2015-12-08 DIAGNOSIS — I739 Peripheral vascular disease, unspecified: Secondary | ICD-10-CM | POA: Diagnosis not present

## 2015-12-08 DIAGNOSIS — Z48812 Encounter for surgical aftercare following surgery on the circulatory system: Secondary | ICD-10-CM | POA: Insufficient documentation

## 2015-12-08 NOTE — Progress Notes (Signed)
[facility]  Patient name: [name] MRN: [id number] DOB: [birth year] Sex: male  REASON FOR VISIT: follow up  HPI: [name] is a 69 y.o. male who is status post aortobifemoral bypass graft as well as left carotid endarterectomy in 2012. He began complaining of leg swelling as well as prominent varicosities. He recently underwent laser ablation of the right small saphenous and left great saphenous vein 4 swelling and pain. He has done well from this procedure and has had significant improvement in his leg swelling.  He recently underwent CABG.  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  The patient was felt to be at too great of risk to undergo concomitant innominate revascularization and CABG.  He is asymptomatic from his innominate stenosis.  He does not have any significant weakness in his right arm although he does state that it is sometimes more fatigue than the left.  He has not had any TIA or stroke symptoms recently.   Past Medical History  Diagnosis Date  . DJD (degenerative joint disease)     L wrist  . Lumbar disc disease   . HTN (hypertension)   . Anemia     NOS  . Osteoporosis     tx per [facility], Dr Prudencio Burly, thought due to heavy steriod use after 1978  . Spine fracture     hx, multiple  . Blindness of left eye     near blindness. 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 GERD (gastroesophageal reflux disease)   . DM2 (diabetes mellitus, type 2) ([place])   . Anxiety   . Depression   . BPH (benign prostatic hypertrophy)   . HLD (hyperlipidemia)   . Chronic pain syndrome   . Chronic hyponatremia   . Diabetes mellitus   . Broken foot Oct. 12, 2013    Right foot Fx  . Varicose veins     Family History  Problem Relation Age of Onset  . Lung cancer Father     and grandmother  . Throat cancer      family hx - "bad living" - also lung CA, heart disease and  renal failure    . Diabetes Mother     DM, also sister and MGF     SOCIAL HISTORY: Social History  Substance Use Topics  . Smoking status: Former Smoker -- 2.00 packs/day    Types: Cigarettes    Quit date: 11/06/1988  . Smokeless tobacco: Never Used  . Alcohol Use: No    Allergies  Allergen Reactions  . Ivp Dye [Iodinated Diagnostic Agents]     Blood Pressure dropped----pt was pre-medicated with 13 hour prep and did fine with pre-meds--amy 03/09/11   . Penicillins Nausea Only    REACTION: gi upset  . Sulfonamide Derivatives Hives and Itching  . Sulfa Antibiotics Hives  . Propoxyphene N-Acetaminophen Other (See Comments)    Sharp pains- headache    Current Outpatient Prescriptions  Medication Sig Dispense Refill  . amiodarone (PACERONE) 400 MG tablet Take 1 tablet (400 mg total) by mouth daily. (Patient taking differently: Take 200 mg by mouth daily. ) 30 tablet 1  . atorvastatin (LIPITOR) 80 MG tablet Take 1 tablet (80 mg total) by mouth daily at 6 (six) AM. 30 tablet 1  . Blood Glucose Monitoring Suppl (BAYER BREEZE 2 SYSTEM) W/DEVICE KIT Use to check blood sugars once a day 1 kit 0  . buPROPion (  WELLBUTRIN XL) 300 MG 24 hr tablet Take 300 mg by mouth every morning.     . carvedilol (COREG) 6.25 MG tablet Take 1 tablet by mouth daily.    . Cholecalciferol (VITAMIN D) 1000 UNITS capsule Take 5,000 Units by mouth daily.     . clonazePAM (KLONOPIN) 1 MG tablet Take 1 mg by mouth 3 (three) times daily.     . clopidogrel (PLAVIX) 75 MG tablet Take 75 mg by mouth daily.    . fentaNYL (DURAGESIC - DOSED MCG/HR) 100 MCG/HR Place 1 patch onto the skin every 3 (three) days.    Marland Kitchen lisinopril (PRINIVIL,ZESTRIL) 2.5 MG tablet Take 1 tablet (2.5 mg total) by mouth daily. 30 tablet 1  . MICROLET LANCETS MISC     . omeprazole (PRILOSEC) 40 MG capsule Take 1 capsule by mouth 2 (two) times daily.    Marland Kitchen oxyCODONE (ROXICODONE) 15 MG immediate release tablet Take 15-30 mg by mouth every 4 (four)  hours as needed for pain.    Marland Kitchen PROAIR HFA 108 (90 BASE) MCG/ACT inhaler     . sitaGLIPtin (JANUVIA) 100 MG tablet Take 1 tablet (100 mg total) by mouth daily. 30 tablet 3  . tamsulosin (FLOMAX) 0.4 MG CAPS capsule Take 1 capsule by mouth 2 (two) times daily.    Marland Kitchen VITAMIN E PO Take 8,000 Units by mouth daily.    Marland Kitchen BAYER BREEZE 2 TEST DISK USE TO TEST BLOOD GLUCOSE THREE TIMES DAILY (Patient not taking: Reported on 12/08/2015) 100 each 3   No current facility-administered medications for this visit.    REVIEW OF SYSTEMS:  [X]  denotes positive finding, [ ]  denotes negative finding Cardiac  Comments:  Chest pain or chest pressure:    Shortness of breath upon exertion:    Short of breath when lying flat:    Irregular heart rhythm:        Vascular    Pain in calf, thigh, or hip brought on by ambulation:    Pain in feet at night that wakes you up from your sleep:     Blood clot in your veins:    Leg swelling:         Pulmonary    Oxygen at home:    Productive cough:     Wheezing:         Neurologic    Sudden weakness in arms or legs:     Sudden numbness in arms or legs:     Sudden onset of difficulty speaking or slurred speech:    Temporary loss of vision in one eye:     Problems with dizziness:  x       Gastrointestinal    Blood in stool:     Vomited blood:         Genitourinary    Burning when urinating:     Blood in urine:        Psychiatric    Major depression:         Hematologic    Bleeding problems:    Problems with blood clotting too easily:        Skin    Rashes or ulcers:        Constitutional    Fever or chills:      PHYSICAL EXAM: Filed Vitals:   12/08/15 1205 12/08/15 1207 12/08/15 1209  BP:  157/76 159/77  Pulse: 66 65   Height: 6' (1.829 m)    Weight: 216 lb 6.4 oz (98.158 kg)  SpO2: 98%      GENERAL: The patient is a well-nourished male, in no acute distress. The vital signs are documented above. CARDIAC: There is a regular rate and  rhythm.  VASCULAR: Palpable pedal pulses bilaterally PULMONARY: There is good air exchange bilaterally without wheezing or rales. ABDOMEN: Soft and non-tender with normal pitched bowel sounds.  MUSCULOSKELETAL: There are no major deformities or cyanosis. NEUROLOGIC: No focal weakness or paresthesias are detected. SKIN: There are no ulcers or rashes noted. PSYCHIATRIC: The patient has a normal affect.  DATA:  I have ordered and reviewed his vascular lab studies.  Today it ABI on the right is 1.19 with triphasic waveforms.  ABI on the left is 0.96 with triphasic waveforms.  Carotid duplex shows widely patent left carotid endarterectomy and velocities on the right side less than 40% brachial pressure on the right was 85.  On the left was 161  MEDICAL ISSUES: Carotid stenosis: The patient has widely patent carotid arteries bilaterally with a widely patent left carotid endarterectomy site.  He'll follow up in one year with a repeat ultrasound PVD, lower extremity: Aortobifem is widely patent with normal ankle-brachial indices bilaterally.  I will get a aortoiliac duplex in one year to evaluate for aneurysmal degeneration. 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  Based on the characteristics of this lesion, I do not think that he is a good candidate for stenting.  Since he recently underwent sternotomy, he is also not a candidate for open surgical revascularization.  Therefore we will continue to monitor him.  He remained asymptomatic.    Annamarie Major [facility] [employer]: [phone]

## 2015-12-16 ENCOUNTER — Encounter (HOSPITAL_COMMUNITY)
Admission: RE | Admit: 2015-12-16 | Discharge: 2015-12-16 | Disposition: A | Discharge status: Home / Self Care | Payer: Medicare Other | Source: Ambulatory Visit | Attending: Cardiology | Admitting: Cardiology

## 2015-12-16 VITALS — Ht 72.25 in | Wt 218.5 lb

## 2015-12-16 DIAGNOSIS — Z951 Presence of aortocoronary bypass graft: Secondary | ICD-10-CM | POA: Diagnosis not present

## 2015-12-16 DIAGNOSIS — M810 Age-related osteoporosis without current pathological fracture: Secondary | ICD-10-CM | POA: Diagnosis not present

## 2015-12-16 DIAGNOSIS — E785 Hyperlipidemia, unspecified: Secondary | ICD-10-CM | POA: Insufficient documentation

## 2015-12-16 DIAGNOSIS — I10 Essential (primary) hypertension: Secondary | ICD-10-CM | POA: Insufficient documentation

## 2015-12-16 DIAGNOSIS — K219 Gastro-esophageal reflux disease without esophagitis: Secondary | ICD-10-CM | POA: Diagnosis not present

## 2015-12-16 DIAGNOSIS — M4646 Discitis, unspecified, lumbar region: Secondary | ICD-10-CM | POA: Insufficient documentation

## 2015-12-16 DIAGNOSIS — Z79899 Other long term (current) drug therapy: Secondary | ICD-10-CM | POA: Insufficient documentation

## 2015-12-16 DIAGNOSIS — E118 Type 2 diabetes mellitus with unspecified complications: Secondary | ICD-10-CM | POA: Insufficient documentation

## 2015-12-16 DIAGNOSIS — Z87891 Personal history of nicotine dependence: Secondary | ICD-10-CM | POA: Insufficient documentation

## 2015-12-16 NOTE — Progress Notes (Signed)
Cardiac Rehab Medication Review by a Pharmacist  Does the patient  feel that his/her medications are working for him/her?  yes  Has the patient been experiencing any side effects to the medications prescribed?  no  Does the patient measure his/her own blood pressure or blood glucose at home?  yes   Does the patient have any problems obtaining medications due to transportation or finances?   no  Understanding of regimen: good Understanding of indications: good Potential of compliance: good    Pharmacist comments: We reviewed his medications and the purpose of each of them. I answered all the questions he had. He has a good understanding of his regimen.  Governor Specking, PharmD Clinical Pharmacy Resident Pager: [phone] 12/16/2015 2:59 PM

## 2015-12-17 ENCOUNTER — Encounter (HOSPITAL_COMMUNITY): Payer: Self-pay

## 2015-12-17 NOTE — Progress Notes (Addendum)
Cardiac Individual Treatment Plan  Patient Details  Name: [name] MRN: [id number] Date of Birth: 11-May-1946 Referring Provider:        CARDIAC REHAB PHASE II ORIENTATION from 12/16/2015 in [place]   Referring Provider  Kela Millin MD      Initial Encounter Date:       CARDIAC REHAB PHASE II ORIENTATION from 12/16/2015 in [place]   Date  12/16/15   Referring Provider  Kela Millin MD      Visit Diagnosis: S/P CABG (coronary artery bypass graft)  Patient's Home Medications on Admission:  Current outpatient prescriptions:  .  amiodarone (PACERONE) 400 MG tablet, Take 1 tablet (400 mg total) by mouth daily. (Patient taking differently: Take 200 mg by mouth daily. ), Disp: 30 tablet, Rfl: 1 .  BAYER BREEZE 2 TEST DISK, USE TO TEST BLOOD GLUCOSE THREE TIMES DAILY, Disp: 100 each, Rfl: 3 .  Blood Glucose Monitoring Suppl (BAYER BREEZE 2 SYSTEM) W/DEVICE KIT, Use to check blood sugars once a day, Disp: 1 kit, Rfl: 0 .  buPROPion (WELLBUTRIN XL) 300 MG 24 hr tablet, Take 300 mg by mouth every morning. , Disp: , Rfl:  .  carvedilol (COREG) 6.25 MG tablet, Take 1 tablet by mouth daily., Disp: , Rfl:  .  Cholecalciferol (VITAMIN D) 1000 UNITS capsule, Take 5,000 Units by mouth daily. , Disp: , Rfl:  .  clonazePAM (KLONOPIN) 1 MG tablet, Take 1 mg by mouth 3 (three) times daily. , Disp: , Rfl:  .  clopidogrel (PLAVIX) 75 MG tablet, Take 75 mg by mouth daily., Disp: , Rfl:  .  fentaNYL (DURAGESIC - DOSED MCG/HR) 100 MCG/HR, Place 1 patch onto the skin every 3 (three) days., Disp: , Rfl:  .  lisinopril (PRINIVIL,ZESTRIL) 2.5 MG tablet, Take 1 tablet (2.5 mg total) by mouth daily., Disp: 30 tablet, Rfl: 1 .  MICROLET LANCETS MISC, , Disp: , Rfl:  .  omeprazole (PRILOSEC) 40 MG capsule, Take 1 capsule by mouth 2 (two) times daily., Disp: , Rfl:  .  oxyCODONE (ROXICODONE) 15 MG immediate release tablet, Take 15-30 mg by  mouth every 4 (four) hours as needed for pain., Disp: , Rfl:  .  PROAIR HFA 108 (90 BASE) MCG/ACT inhaler, , Disp: , Rfl:  .  sitaGLIPtin (JANUVIA) 100 MG tablet, Take 1 tablet (100 mg total) by mouth daily., Disp: 30 tablet, Rfl: 3 .  tamsulosin (FLOMAX) 0.4 MG CAPS capsule, Take 1 capsule by mouth 2 (two) times daily., Disp: , Rfl:  .  VITAMIN E PO, Take 8,000 Units by mouth daily., Disp: , Rfl:  .  atorvastatin (LIPITOR) 80 MG tablet, Take 1 tablet (80 mg total) by mouth daily at 6 (six) AM. (Patient not taking: Reported on 12/16/2015), Disp: 30 tablet, Rfl: 1  Past Medical History: Past Medical History  Diagnosis Date  . DJD (degenerative joint disease)     L wrist  . Lumbar disc disease   . HTN (hypertension)   . Anemia     NOS  . Osteoporosis     tx per [facility], Dr Prudencio Burly, thought due to heavy steriod use after 1978  . Spine fracture     hx, multiple  . Blindness of left eye     near blindness. 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 GERD (gastroesophageal reflux disease)   . DM2 (diabetes mellitus, type 2) ([place])   . Anxiety   . Depression   .  BPH (benign prostatic hypertrophy)   . HLD (hyperlipidemia)   . Chronic pain syndrome   . Chronic hyponatremia   . Diabetes mellitus   . Broken foot Oct. 12, 2013    Right foot Fx  . Varicose veins     Tobacco Use: History  Smoking status  . Former Smoker -- 2.00 packs/day  . Types: Cigarettes  . Quit date: 11/06/1988  Smokeless tobacco  . Never Used    Labs: Recent Review Flowsheet Data    Labs for ITP Cardiac and Pulmonary Rehab Latest Ref Rng 09/22/2015 09/22/2015 09/22/2015 09/22/2015 09/23/2015   PHART 7.350 - 7.450 7.289(L) 7.275(L) 7.317(L) - -   PCO2ART 35.0 - 45.0 mmHg 48.4(H) 43.8 44.4 - -   HCO3 20.0 - 24.0 mEq/L 23.3 20.4 22.8 - -   TCO2 0 - 100 mmol/L _0 ACIDBASEDEF 0.0 - 2.0 mmol/L 4.0(H) 6.0(H) 3.0(H) - -   O2SAT - 100.0 98.0 95.0 - -      Capillary Blood Glucose: Lab Results  Component Value Date   GLUCAP 143*  11/06/2015   GLUCAP 123* 09/29/2015   GLUCAP 95 09/29/2015   GLUCAP 106* 09/28/2015   GLUCAP 136* 09/28/2015     Exercise Target Goals: Date: 12/16/15  Exercise Program Goal: Individual exercise prescription set with THRR, safety & activity barriers. Participant demonstrates ability to understand and report RPE using BORG scale, to self-measure pulse accurately, and to acknowledge the importance of the exercise prescription.  Exercise Prescription Goal: Starting with aerobic activity 30 plus minutes a day, 3 days per week for initial exercise prescription. Provide home exercise prescription and guidelines that participant acknowledges understanding prior to discharge.  Activity Barriers & Risk Stratification:     Activity Barriers & Cardiac Risk Stratification - 12/16/15 1404    Activity Barriers & Cardiac Risk Stratification   Activity Barriers Arthritis;Balance Concerns;History of Falls   Cardiac Risk Stratification High      6 Minute Walk:     6 Minute Walk      12/16/15 1559       6 Minute Walk   Phase Initial     Distance 694 feet     Walk Time 6 minutes     # of Rest Breaks 0     MPH 1.31     METS 1.68     RPE 14     VO2 Peak 5.89     Symptoms Yes (comment)     Comments Legs weak, "felt full of fluid"     Resting HR 61 bpm     Resting BP 126/64 mmHg     Max Ex. HR 75 bpm     Max Ex. BP 144/64 mmHg     2 Minute Post BP 124/70 mmHg        Initial Exercise Prescription:     Initial Exercise Prescription - 12/16/15 1600    Date of Initial Exercise RX and Referring Provider   Date 12/16/15   Referring Provider Kela Millin MD   Bike   Level 0.4   Minutes 10   METs 1.75   NuStep   Level 1   Minutes 10   METs 2   Track   Laps 5   Minutes 10   METs 0.84   Prescription Details   Frequency (times per week) 3   Duration Progress to 30 minutes of continuous aerobic without signs/symptoms of physical distress   Intensity   THRR 40-80% of  Max  Heartrate [phone]   Ratings of Perceived Exertion 11-13   Perceived Dyspnea 0-4   Progression   Progression Continue to progress workloads to maintain intensity without signs/symptoms of physical distress.   Resistance Training   Training Prescription Yes   Weight 1 lb   Reps 10-12      Perform Capillary Blood Glucose checks as needed.  Exercise Prescription Changes:   Exercise Comments:   Discharge Exercise Prescription (Final Exercise Prescription Changes):   Nutrition:  Target Goals: Understanding of nutrition guidelines, daily intake of sodium <1583m, cholesterol <2056m calories 30% from fat and 7% or less from saturated fats, daily to have 5 or more servings of fruits and vegetables.  Biometrics:     Pre Biometrics - 12/16/15 1605    Pre Biometrics   Height 6' 0.25" (1.835 m)   Weight 218 lb 7.6 oz (99.1 kg)   Waist Circumference 41 inches   Hip Circumference 42 inches   Waist to Hip Ratio 0.98 %   BMI (Calculated) 29.5   Triceps Skinfold 10 mm   % Body Fat 26.6 %   Grip Strength 25 kg   Flexibility 0 in   Single Leg Stand 0 seconds       Nutrition Therapy Plan and Nutrition Goals:   Nutrition Discharge: Nutrition Scores:   Nutrition Goals Re-Evaluation:   Psychosocial: Target Goals: Acknowledge presence or absence of depression, maximize coping skills, provide positive support system. Participant is able to verbalize types and ability to use techniques and skills needed for reducing stress and depression.  Initial Review & Psychosocial Screening:     Initial Psych Review & Screening - 12/17/15 1014    Initial Review   Current issues with Current Depression  pt grieving loss of son 2 months ago      Quality of Life Scores:   PHQ-9:     Recent Review Flowsheet Data    [facility] 11/20/2014   Decreased Interest - 0   Down, Depressed, Hopeless 1  0   PHQ - 2 Score 1 0      Psychosocial Evaluation and  Intervention:   Psychosocial Re-Evaluation:   Vocational Rehabilitation: Provide vocational rehab assistance to qualifying candidates.   Vocational Rehab Evaluation & Intervention:     Vocational Rehab - 12/16/15 1405    Initial Vocational Rehab Evaluation & Intervention   Assessment shows need for Vocational Rehabilitation No      Education: Education Goals: Education classes will be provided on a weekly basis, covering required topics. Participant will state understanding/return demonstration of topics presented.  Learning Barriers/Preferences:     Learning Barriers/Preferences - 12/16/15 1613    Learning Barriers/Preferences   Learning Barriers Sight;Hearing   Learning Preferences None      Education Topics: Count Your Pulse:  -Group instruction provided by verbal instruction, demonstration, patient participation and written materials to support subject.  Instructors address importance of being able to find your pulse and how to count your pulse when at home without a heart monitor.  Patients get hands on experience counting their pulse with staff help and individually.   Heart Attack, Angina, and Risk Factor Modification:  -Group instruction provided by verbal instruction, video, and written materials to support subject.  Instructors address signs and symptoms of angina and heart attacks.    Also discuss risk factors for heart disease and how to make changes to improve heart health risk factors.   Functional Fitness:  -Group instruction provided by  verbal instruction, demonstration, patient participation, and written materials to support subject.  Instructors address safety measures for doing things around the house.  Discuss how to get up and down off the floor, how to pick things up properly, how to safely get out of a chair without assistance, and balance training.   Meditation and Mindfulness:  -Group instruction provided by verbal instruction, patient  participation, and written materials to support subject.  Instructor addresses importance of mindfulness and meditation practice to help reduce stress and improve awareness.  Instructor also leads participants through a meditation exercise.    Stretching for Flexibility and Mobility:  -Group instruction provided by verbal instruction, patient participation, and written materials to support subject.  Instructors lead participants through series of stretches that are designed to increase flexibility thus improving mobility.  These stretches are additional exercise for major muscle groups that are typically performed during regular warm up and cool down.   Hands Only CPR Anytime:  -Group instruction provided by verbal instruction, video, patient participation and written materials to support subject.  Instructors co-teach with AHA video for hands only CPR.  Participants get hands on experience with mannequins.   Nutrition I class: Heart Healthy Eating:  -Group instruction provided by PowerPoint slides, verbal discussion, and written materials to support subject matter. The instructor gives an explanation and review of the Therapeutic Lifestyle Changes diet recommendations, which includes a discussion on lipid goals, dietary fat, sodium, fiber, plant stanol/sterol esters, sugar, and the components of a well-balanced, healthy diet.   Nutrition II class: Lifestyle Skills:  -Group instruction provided by PowerPoint slides, verbal discussion, and written materials to support subject matter. The instructor gives an explanation and review of label reading, grocery shopping for heart health, heart healthy recipe modifications, and ways to make healthier choices when eating out.   Diabetes Question & Answer:  -Group instruction provided by PowerPoint slides, verbal discussion, and written materials to support subject matter. The instructor gives an explanation and review of diabetes co-morbidities, pre- and  post-prandial blood glucose goals, pre-exercise blood glucose goals, signs, symptoms, and treatment of hypoglycemia and hyperglycemia, and foot care basics.   Diabetes Blitz:  -Group instruction provided by PowerPoint slides, verbal discussion, and written materials to support subject matter. The instructor gives an explanation and review of the physiology behind type 1 and type 2 diabetes, diabetes medications and rational behind using different medications, pre- and post-prandial blood glucose recommendations and Hemoglobin A1c goals, diabetes diet, and exercise including blood glucose guidelines for exercising safely.    Portion Distortion:  -Group instruction provided by PowerPoint slides, verbal discussion, written materials, and food models to support subject matter. The instructor gives an explanation of serving size versus portion size, changes in portions sizes over the last 20 years, and what consists of a serving from each food group.   Stress Management:  -Group instruction provided by verbal instruction, video, and written materials to support subject matter.  Instructors review role of stress in heart disease and how to cope with stress positively.     Exercising on Your Own:  -Group instruction provided by verbal instruction, power point, and written materials to support subject.  Instructors discuss benefits of exercise, components of exercise, frequency and intensity of exercise, and end points for exercise.  Also discuss use of nitroglycerin and activating EMS.  Review options of places to exercise outside of rehab.  Review guidelines for sex with heart disease.   Cardiac Drugs I:  -Group instruction provided by verbal  instruction and written materials to support subject.  Instructor reviews cardiac drug classes: antiplatelets, anticoagulants, beta blockers, and statins.  Instructor discusses reasons, side effects, and lifestyle considerations for each drug class.   Cardiac  Drugs II:  -Group instruction provided by verbal instruction and written materials to support subject.  Instructor reviews cardiac drug classes: angiotensin converting enzyme inhibitors (ACE-I), angiotensin II receptor blockers (ARBs), nitrates, and calcium channel blockers.  Instructor discusses reasons, side effects, and lifestyle considerations for each drug class.   Anatomy and Physiology of the Circulatory System:  -Group instruction provided by verbal instruction, video, and written materials to support subject.  Reviews functional anatomy of heart, how it relates to various diagnoses, and what role the heart plays in the overall system.   Knowledge Questionnaire Score:   Core Components/Risk Factors/Patient Goals at Admission:     Personal Goals and Risk Factors at Admission - 12/16/15 1614    Core Components/Risk Factors/Patient Goals on Admission   Sedentary Yes   Intervention Provide advice, education, support and counseling about physical activity/exercise needs.;Develop an individualized exercise prescription for aerobic and resistive training based on initial evaluation findings, risk stratification, comorbidities and participant's personal goals.   Expected Outcomes Achievement of increased cardiorespiratory fitness and enhanced flexibility, muscular endurance and strength shown through measurements of functional capacity and personal statement of participant.   Increase Strength and Stamina Yes   Intervention Provide advice, education, support and counseling about physical activity/exercise needs.;Develop an individualized exercise prescription for aerobic and resistive training based on initial evaluation findings, risk stratification, comorbidities and participant's personal goals.   Expected Outcomes Achievement of increased cardiorespiratory fitness and enhanced flexibility, muscular endurance and strength shown through measurements of functional capacity and personal  statement of participant.   Diabetes Yes   Intervention Provide education about signs/symptoms and action to take for hypo/hyperglycemia.;Provide education about proper nutrition, including hydration, and aerobic/resistive exercise prescription along with prescribed medications to achieve blood glucose in normal ranges: Fasting glucose 65-99 mg/dL   Expected Outcomes Short Term: Participant verbalizes understanding of the signs/symptoms and immediate care of hyper/hypoglycemia, proper foot care and importance of medication, aerobic/resistive exercise and nutrition plan for blood glucose control.;Long Term: Attainment of HbA1C < 7%.   Hypertension Yes   Intervention Provide education on lifestyle modifcations including regular physical activity/exercise, weight management, moderate sodium restriction and increased consumption of fresh fruit, vegetables, and low fat dairy, alcohol moderation, and smoking cessation.;Monitor prescription use compliance.   Expected Outcomes Short Term: Continued assessment and intervention until BP is < 140/60m HG in hypertensive participants. < 130/817mHG in hypertensive participants with diabetes, heart failure or chronic kidney disease.;Long Term: Maintenance of blood pressure at goal levels.   Lipids Yes   Intervention Provide education and support for participant on nutrition & aerobic/resistive exercise along with prescribed medications to achieve LDL <7025mHDL >35m79m Expected Outcomes Short Term: Participant states understanding of desired cholesterol values and is compliant with medications prescribed. Participant is following exercise prescription and nutrition guidelines.;Long Term: Cholesterol controlled with medications as prescribed, with individualized exercise RX and with personalized nutrition plan. Value goals: LDL < 70mg67mL > 40 mg.   Stress Yes   Intervention Offer individual and/or small group education and counseling on adjustment to heart disease,  stress management and health-related lifestyle change. Teach and support self-help strategies.   Expected Outcomes Short Term: Participant demonstrates changes in health-related behavior, relaxation and other stress management skills, ability to obtain effective social support, and compliance  with psychotropic medications if prescribed.;Long Term: Emotional wellbeing is indicated by absence of clinically significant psychosocial distress or social isolation.      Core Components/Risk Factors/Patient Goals Review:    Core Components/Risk Factors/Patient Goals at Discharge (Final Review):    ITP Comments:   Comments: Patient attended orientation from 1330 to 1530 to review rules and guidelines for program. Completed 6 minute walk test, Intitial ITP, and exercise prescription.  VSS. Telemetry-sinus rhythm, first degree AV block.  Asymptomatic.

## 2015-12-22 ENCOUNTER — Ambulatory Visit (HOSPITAL_COMMUNITY): Payer: PRIVATE HEALTH INSURANCE

## 2015-12-24 ENCOUNTER — Inpatient Hospital Stay (HOSPITAL_COMMUNITY): Admission: RE | Admit: 2015-12-24 | Payer: PRIVATE HEALTH INSURANCE | Source: Ambulatory Visit

## 2015-12-26 ENCOUNTER — Ambulatory Visit (HOSPITAL_COMMUNITY): Payer: PRIVATE HEALTH INSURANCE

## 2015-12-29 ENCOUNTER — Institutional Professional Consult (permissible substitution): Payer: PRIVATE HEALTH INSURANCE | Admitting: Internal Medicine

## 2015-12-29 ENCOUNTER — Ambulatory Visit (HOSPITAL_COMMUNITY): Payer: PRIVATE HEALTH INSURANCE

## 2015-12-31 ENCOUNTER — Ambulatory Visit (HOSPITAL_COMMUNITY): Payer: PRIVATE HEALTH INSURANCE

## 2016-01-02 ENCOUNTER — Ambulatory Visit (HOSPITAL_COMMUNITY): Payer: PRIVATE HEALTH INSURANCE

## 2016-01-05 ENCOUNTER — Ambulatory Visit (HOSPITAL_COMMUNITY): Payer: PRIVATE HEALTH INSURANCE

## 2016-01-07 ENCOUNTER — Ambulatory Visit (HOSPITAL_COMMUNITY): Payer: PRIVATE HEALTH INSURANCE

## 2016-01-08 ENCOUNTER — Encounter: Payer: Self-pay | Admitting: Cardiothoracic Surgery

## 2016-01-08 ENCOUNTER — Ambulatory Visit (INDEPENDENT_AMBULATORY_CARE_PROVIDER_SITE_OTHER): Payer: PRIVATE HEALTH INSURANCE | Admitting: Cardiothoracic Surgery

## 2016-01-08 VITALS — BP 119/64 | HR 55 | Resp 16 | Ht 72.0 in | Wt 218.2 lb

## 2016-01-08 DIAGNOSIS — Z951 Presence of aortocoronary bypass graft: Secondary | ICD-10-CM

## 2016-01-08 NOTE — Progress Notes (Signed)
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 SURGICAL PROCEDURE: Coronary artery bypass grafting x4 with left internal mammary to the left anterior descending coronary artery, sequential reverse saphenous vein graft to the highest diagonal and first obtuse marginal, reverse saphenous vein graft to the posterior descending with right thigh greater saphenous endo vein harvesting. SURGEON: Lanelle Bal, MD.  History of Present Illness:     Patient returns to the office for  follow-up  check. . He's increasing his physical activity appropriately. His sternal incision has completely healed. He's increased his physical activity. He has not had any further syncopal knee or near syncopal episodes. He was seen in the vascular surgery office recently and had repeat carotid Dopplers done.   Past Surgical History  Procedure Laterality Date  . Right hand fracture  1969  . Left ankle ganglion cyst  1976  . Lumbar disc surgery  1981, 2004  . Nasal sinus surgery      multiple- x8. last 1997 with obliteration  . Left cataract  1996    right - 1997  . Left foot surgery  1998    R surgery/fracture - 1999  . Left cts  2006    R CTS - 2007  . Left wrist/hand fusion  2008  . L foot open repair jones fracture  2010     5th metetarsal   . Rotator cuff repair  2006    R, than repeat 2011, Dr Theda Sers  . Vocal surgery  1996  . Repair thoracic aorta    . Trigger finger release Left 11/11/2014    Procedure: LEFT LONG FINGER RELEASE TRIGGER FINGER/A-1 PULLEY;  Surgeon: Milly Jakob, MD;   Location: [facility];  Service: Orthopedics;  Laterality: Left;  Marland Kitchen Eye surgery    . Cardiac catheterization N/A 09/19/2015    Procedure: Left Heart Cath and Coronary Angiography;  Surgeon: Adrian Prows, MD;  Location: [facility];  Service: Cardiovascular;  Laterality: N/A;  . Coronary artery bypass graft N/A 09/22/2015    Procedure: CORONARY ARTERY BYPASS GRAFTING (CABG) times four using the right greater saphenous vein harvested endoscopically and the left internal mammary artery.  LIMA-LAD, SEQ SVG-DIAG & OM, SVG-PD.;  Surgeon: [name], MD;  Location: [facility];  Service: Open Heart Surgery;  Laterality: N/A;    Past Medical History  Diagnosis Date  . DJD (degenerative joint disease)     L wrist  . Lumbar disc disease   . HTN (hypertension)   . Anemia     NOS  . Osteoporosis     tx per [facility], Dr Prudencio Burly, thought due to heavy steriod use after 1978  . Spine fracture     hx, multiple  . Blindness of left eye     near blindness. 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 GERD (gastroesophageal reflux disease)   . DM2 (diabetes mellitus, type 2) ([place])   . Anxiety   . Depression   . BPH (benign prostatic hypertrophy)   . HLD (hyperlipidemia)   . Chronic pain  syndrome   . Chronic hyponatremia   . Diabetes mellitus   . Broken foot Oct. 12, 2013    Right foot Fx  . Varicose veins      History  Smoking status  . Former Smoker -- 2.00 packs/day  . Types: Cigarettes  . Quit date: 11/06/1988  Smokeless tobacco  . Never Used    History  Alcohol Use No     Allergies  Allergen Reactions  . Ivp Dye [Iodinated Diagnostic Agents]     Blood Pressure dropped----pt was pre-medicated with 13 hour prep and did fine with pre-meds--amy 03/09/11   . Penicillins Nausea Only    REACTION: gi upset  . Sulfa Antibiotics Hives  . Sulfonamide Derivatives Hives and Itching  . Propoxyphene N-Acetaminophen Other (See Comments)    Sharp pains- headache    Current Outpatient Prescriptions    Medication Sig Dispense Refill  . BAYER BREEZE 2 TEST DISK USE TO TEST BLOOD GLUCOSE THREE TIMES DAILY 100 each 3  . Blood Glucose Monitoring Suppl (BAYER BREEZE 2 SYSTEM) W/DEVICE KIT Use to check blood sugars once a day 1 kit 0  . buPROPion (WELLBUTRIN XL) 300 MG 24 hr tablet Take 300 mg by mouth every morning.     . carvedilol (COREG) 6.25 MG tablet Take 1 tablet by mouth daily.    . Cholecalciferol (VITAMIN D) 1000 UNITS capsule Take 5,000 Units by mouth daily.     . clonazePAM (KLONOPIN) 1 MG tablet Take 1 mg by mouth 3 (three) times daily.     . clopidogrel (PLAVIX) 75 MG tablet Take 75 mg by mouth daily.    . fentaNYL (DURAGESIC - DOSED MCG/HR) 100 MCG/HR Place 1 patch onto the skin every 3 (three) days.    Marland Kitchen lisinopril (PRINIVIL,ZESTRIL) 2.5 MG tablet Take 1 tablet (2.5 mg total) by mouth daily. 30 tablet 1  . MICROLET LANCETS MISC     . omeprazole (PRILOSEC) 40 MG capsule Take 1 capsule by mouth 2 (two) times daily.    Marland Kitchen oxyCODONE (ROXICODONE) 15 MG immediate release tablet Take 15-30 mg by mouth every 4 (four) hours as needed for pain.    Marland Kitchen PROAIR HFA 108 (90 BASE) MCG/ACT inhaler     . sitaGLIPtin (JANUVIA) 100 MG tablet Take 1 tablet (100 mg total) by mouth daily. 30 tablet 3  . tamsulosin (FLOMAX) 0.4 MG CAPS capsule Take 1 capsule by mouth 2 (two) times daily.    Marland Kitchen VITAMIN E PO Take 8,000 Units by mouth daily.     No current facility-administered medications for this visit.       Physical Exam: BP 119/64 mmHg  Pulse 55  Resp 16  Ht 6' (1.829 m)  Wt 218 lb 3.2 oz (98.975 kg)  BMI 29.59 kg/m2  SpO2 95% Blood pressure 92/65 in the right arm, simultaneous his blood pressure was159/70 in the left arm General appearance: alert, cooperative and no distress Neurologic: intact Heart: regular rate and rhythm, S1, S2 normal, no murmur, click, rub or gallop Lungs: clear to auscultation bilaterally Abdomen: soft, non-tender; bowel sounds normal; no masses,  no  organomegaly Extremities: extremities normal, atraumatic, no cyanosis or edema and Homans sign is negative, no sign of DVT Wound:  sternum is stable without movement, His wound is well-healed without evidence of infection  Diagnostic Studies & Laboratory data:     Recent Radiology Findings:    No results found.    Recent Lab Findings: Lab Results  Component Value Date  WBC 4.2 11/06/2015   HGB 10.1* 11/06/2015   HCT 31.5* 11/06/2015   PLT 275 11/06/2015   GLUCOSE 138* 11/06/2015   CHOL 155 11/15/2014   TRIG 85.0 11/15/2014   HDL 39.80 11/15/2014   LDLDIRECT 108.2 03/13/2010   LDLCALC 98 11/15/2014   ALT 23 09/21/2015   AST 29 09/21/2015   NA 130* 11/06/2015   K 3.9 11/06/2015   CL 97* 11/06/2015   CREATININE 0.83 11/06/2015   BUN 9 11/06/2015   CO2 27 11/06/2015   TSH 2.388 09/25/2015   INR 1.37 09/22/2015   HGBA1C 7.0* 09/21/2015      Assessment / Plan:   Overall patient is making good progress now over 3 months following his coronary artery bypass grafting, he is seen cardiology and having cholesterol actively treated. Recent carotid Doppler's showed patent bilateral carotid arteries, he does have blood pressure discrepancy discrepancy between the right and left arm right knee and lower, asymptomatic I've not made him return appointment to see me as he's mostly followed by cardiology and has annual carotid Doppler studies in the vascular office.  [name] MD      [address] [place] [address] Office [phone]   Beeper [phone]  01/08/2016 9:59 AM

## 2016-01-09 ENCOUNTER — Ambulatory Visit (HOSPITAL_COMMUNITY): Payer: PRIVATE HEALTH INSURANCE

## 2016-01-13 NOTE — Addendum Note (Signed)
Addended by: Mena Goes on: 01/13/2016 04:01 PM   Modules accepted: Orders

## 2016-01-14 ENCOUNTER — Ambulatory Visit (HOSPITAL_COMMUNITY): Payer: PRIVATE HEALTH INSURANCE

## 2016-01-16 ENCOUNTER — Ambulatory Visit (HOSPITAL_COMMUNITY): Payer: PRIVATE HEALTH INSURANCE

## 2016-01-19 ENCOUNTER — Ambulatory Visit (HOSPITAL_COMMUNITY): Payer: PRIVATE HEALTH INSURANCE

## 2016-01-21 ENCOUNTER — Ambulatory Visit (HOSPITAL_COMMUNITY): Payer: PRIVATE HEALTH INSURANCE

## 2016-01-23 ENCOUNTER — Ambulatory Visit (HOSPITAL_COMMUNITY): Payer: PRIVATE HEALTH INSURANCE

## 2016-01-26 ENCOUNTER — Ambulatory Visit (HOSPITAL_COMMUNITY): Payer: PRIVATE HEALTH INSURANCE

## 2016-01-28 ENCOUNTER — Ambulatory Visit (HOSPITAL_COMMUNITY): Payer: PRIVATE HEALTH INSURANCE

## 2016-01-30 ENCOUNTER — Ambulatory Visit (HOSPITAL_COMMUNITY): Payer: PRIVATE HEALTH INSURANCE

## 2016-02-02 ENCOUNTER — Ambulatory Visit (HOSPITAL_COMMUNITY): Payer: PRIVATE HEALTH INSURANCE

## 2016-02-04 ENCOUNTER — Ambulatory Visit (HOSPITAL_COMMUNITY): Payer: PRIVATE HEALTH INSURANCE

## 2016-02-06 ENCOUNTER — Ambulatory Visit (HOSPITAL_COMMUNITY): Payer: PRIVATE HEALTH INSURANCE

## 2016-02-09 ENCOUNTER — Ambulatory Visit (HOSPITAL_COMMUNITY): Payer: PRIVATE HEALTH INSURANCE

## 2016-02-11 ENCOUNTER — Ambulatory Visit (HOSPITAL_COMMUNITY): Payer: PRIVATE HEALTH INSURANCE

## 2016-02-13 ENCOUNTER — Ambulatory Visit (HOSPITAL_COMMUNITY): Payer: PRIVATE HEALTH INSURANCE

## 2016-02-16 ENCOUNTER — Ambulatory Visit (HOSPITAL_COMMUNITY): Payer: PRIVATE HEALTH INSURANCE

## 2016-02-18 ENCOUNTER — Ambulatory Visit (HOSPITAL_COMMUNITY): Payer: PRIVATE HEALTH INSURANCE

## 2016-02-20 ENCOUNTER — Ambulatory Visit (HOSPITAL_COMMUNITY): Payer: PRIVATE HEALTH INSURANCE

## 2016-02-23 ENCOUNTER — Ambulatory Visit (HOSPITAL_COMMUNITY): Payer: PRIVATE HEALTH INSURANCE

## 2016-02-25 ENCOUNTER — Ambulatory Visit (HOSPITAL_COMMUNITY): Payer: PRIVATE HEALTH INSURANCE

## 2016-02-27 ENCOUNTER — Ambulatory Visit (HOSPITAL_COMMUNITY): Payer: PRIVATE HEALTH INSURANCE

## 2016-03-01 ENCOUNTER — Ambulatory Visit (HOSPITAL_COMMUNITY): Payer: PRIVATE HEALTH INSURANCE

## 2016-03-03 ENCOUNTER — Ambulatory Visit (HOSPITAL_COMMUNITY): Payer: PRIVATE HEALTH INSURANCE

## 2016-03-05 ENCOUNTER — Ambulatory Visit (HOSPITAL_COMMUNITY): Payer: PRIVATE HEALTH INSURANCE

## 2016-03-08 ENCOUNTER — Ambulatory Visit (HOSPITAL_COMMUNITY): Payer: PRIVATE HEALTH INSURANCE

## 2016-03-10 ENCOUNTER — Ambulatory Visit (HOSPITAL_COMMUNITY): Payer: PRIVATE HEALTH INSURANCE

## 2016-03-12 ENCOUNTER — Ambulatory Visit (HOSPITAL_COMMUNITY): Payer: PRIVATE HEALTH INSURANCE

## 2016-03-15 ENCOUNTER — Ambulatory Visit (HOSPITAL_COMMUNITY): Payer: PRIVATE HEALTH INSURANCE

## 2016-03-17 ENCOUNTER — Ambulatory Visit (HOSPITAL_COMMUNITY): Payer: PRIVATE HEALTH INSURANCE

## 2016-03-19 ENCOUNTER — Ambulatory Visit (HOSPITAL_COMMUNITY): Payer: PRIVATE HEALTH INSURANCE

## 2016-03-22 ENCOUNTER — Ambulatory Visit (HOSPITAL_COMMUNITY): Payer: PRIVATE HEALTH INSURANCE

## 2016-03-24 ENCOUNTER — Ambulatory Visit (HOSPITAL_COMMUNITY): Payer: PRIVATE HEALTH INSURANCE

## 2016-03-26 ENCOUNTER — Ambulatory Visit (HOSPITAL_COMMUNITY): Payer: PRIVATE HEALTH INSURANCE

## 2016-04-22 DIAGNOSIS — M810 Age-related osteoporosis without current pathological fracture: Secondary | ICD-10-CM | POA: Diagnosis not present

## 2016-05-26 DIAGNOSIS — M810 Age-related osteoporosis without current pathological fracture: Secondary | ICD-10-CM | POA: Diagnosis not present

## 2016-12-11 ENCOUNTER — Encounter (HOSPITAL_COMMUNITY): Payer: Self-pay

## 2016-12-11 ENCOUNTER — Emergency Department (HOSPITAL_COMMUNITY): Payer: Medicare Other

## 2016-12-11 ENCOUNTER — Emergency Department (HOSPITAL_COMMUNITY)
Admission: EM | Admit: 2016-12-11 | Discharge: 2016-12-12 | Disposition: A | Discharge status: Home / Self Care | Payer: Medicare Other | Attending: Emergency Medicine | Admitting: Emergency Medicine

## 2016-12-11 DIAGNOSIS — I10 Essential (primary) hypertension: Secondary | ICD-10-CM | POA: Diagnosis not present

## 2016-12-11 DIAGNOSIS — Z79899 Other long term (current) drug therapy: Secondary | ICD-10-CM | POA: Diagnosis not present

## 2016-12-11 DIAGNOSIS — Z7984 Long term (current) use of oral hypoglycemic drugs: Secondary | ICD-10-CM | POA: Insufficient documentation

## 2016-12-11 DIAGNOSIS — E1140 Type 2 diabetes mellitus with diabetic neuropathy, unspecified: Secondary | ICD-10-CM | POA: Insufficient documentation

## 2016-12-11 DIAGNOSIS — R4182 Altered mental status, unspecified: Secondary | ICD-10-CM | POA: Diagnosis not present

## 2016-12-11 DIAGNOSIS — R5383 Other fatigue: Secondary | ICD-10-CM | POA: Diagnosis present

## 2016-12-11 DIAGNOSIS — Z951 Presence of aortocoronary bypass graft: Secondary | ICD-10-CM | POA: Diagnosis not present

## 2016-12-11 DIAGNOSIS — Z87891 Personal history of nicotine dependence: Secondary | ICD-10-CM | POA: Insufficient documentation

## 2016-12-11 LAB — RAPID URINE DRUG SCREEN, HOSP PERFORMED
AMPHETAMINES: NOT DETECTED
Barbiturates: NOT DETECTED
Benzodiazepines: NOT DETECTED
Cocaine: NOT DETECTED
OPIATES: POSITIVE — AB
TETRAHYDROCANNABINOL: NOT DETECTED

## 2016-12-11 LAB — CBC
HCT: 32.7 % — ABNORMAL LOW (ref 39.0–52.0)
Hemoglobin: 10.7 g/dL — ABNORMAL LOW (ref 13.0–17.0)
MCH: 26.6 pg (ref 26.0–34.0)
MCHC: 32.7 g/dL (ref 30.0–36.0)
MCV: 81.1 fL (ref 78.0–100.0)
Platelets: 285 10*3/uL (ref 150–400)
RBC: 4.03 MIL/uL — AB (ref 4.22–5.81)
RDW: 17.1 % — AB (ref 11.5–15.5)
WBC: 4.5 10*3/uL (ref 4.0–10.5)

## 2016-12-11 LAB — C-REACTIVE PROTEIN

## 2016-12-11 LAB — URINALYSIS, ROUTINE W REFLEX MICROSCOPIC
Bilirubin Urine: NEGATIVE
GLUCOSE, UA: NEGATIVE mg/dL
HGB URINE DIPSTICK: NEGATIVE
KETONES UR: NEGATIVE mg/dL
Leukocytes, UA: NEGATIVE
Nitrite: NEGATIVE
Protein, ur: NEGATIVE mg/dL
Specific Gravity, Urine: 1.005 (ref 1.005–1.030)
pH: 7 (ref 5.0–8.0)

## 2016-12-11 LAB — CBG MONITORING, ED: GLUCOSE-CAPILLARY: 121 mg/dL — AB (ref 65–99)

## 2016-12-11 LAB — BASIC METABOLIC PANEL
Anion gap: 8 (ref 5–15)
BUN: 7 mg/dL (ref 6–20)
CALCIUM: 8.4 mg/dL — AB (ref 8.9–10.3)
CHLORIDE: 94 mmol/L — AB (ref 101–111)
CO2: 23 mmol/L (ref 22–32)
CREATININE: 0.77 mg/dL (ref 0.61–1.24)
GFR calc non Af Amer: >60 mL/min (ref >60)
Glucose, Bld: 135 mg/dL — ABNORMAL HIGH (ref 65–99)
Potassium: 4 mmol/L (ref 3.5–5.1)
SODIUM: 125 mmol/L — AB (ref 135–145)

## 2016-12-11 LAB — I-STAT CG4 LACTIC ACID, ED: LACTIC ACID, VENOUS: 1.6 mmol/L (ref 0.5–1.9)

## 2016-12-11 LAB — SEDIMENTATION RATE: Sed Rate: 30 mm/hr — ABNORMAL HIGH (ref 0–16)

## 2016-12-11 NOTE — ED Notes (Signed)
Checked CBG 121, RN Monique informed

## 2016-12-11 NOTE — ED Provider Notes (Signed)
[facility] DEPT Provider Note   CSN: [id number] Arrival date & time: 12/11/16  1903    History   Chief Complaint Chief Complaint  Patient presents with  . Fatigue  . Wound Infection    HPI [name] is a 71 y.o. male.  71 y/o male with hx of HTN, GERD, DM, HLD, and CAD s/p CABG x 4 presents to the emergency department for evaluation of increased fatigue. Patient brought in by family friend was concerned about the patient's symptoms. Patient with known wound to the left lateral side of his foot. Patient states that he has had this for one week. Patient denies being followed by a physician for this wound; however, friend states that patient did have wound checked on Monday. Per nursing note, friend reports that patient has not left his home since this wound check. Patient complains of feeling sleepy. He has no complaints of pain. Specifically, he denies chest pain, abdominal pain, and shortness of breath. No vomiting or diarrhea. Patient denies known fevers.      Past Medical History:  Diagnosis Date  . Anemia    NOS  . Anxiety   . 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 BPH (benign prostatic hypertrophy)   . Broken foot Oct. 12, 2013   Right foot Fx  . Chronic hyponatremia   . Chronic pain syndrome   . Depression   . Diabetes mellitus   . DJD (degenerative joint disease)    L wrist  . DM2 (diabetes mellitus, type 2) ([place])   . GERD (gastroesophageal reflux disease)   . HLD (hyperlipidemia)   . HTN (hypertension)   . Lumbar disc disease   . Osteoporosis    tx per [facility], Dr Prudencio Burly, thought due to heavy steriod use after 1978  . Spine fracture    hx, multiple  . Varicose veins     Patient Active Problem List   Diagnosis Date Noted  . 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 Unstable angina ([place]) 09/20/2015  . Chest pain, rule out acute myocardial infarction 09/18/2015  . Diabetes mellitus type 2 with peripheral artery disease ([place]) 09/18/2015  . Chronic  hyponatremia 09/18/2015  . Chest pain on exertion 09/18/2015  . Varicose veins of lower extremities with other complications 41/32/4401  . Occlusion and stenosis of carotid artery without mention of cerebral infarction 10/18/2011  . SINUSITIS, ACUTE 03/13/2010  . BACK PAIN 11/13/2009  . FATIGUE 10/17/2009  . Diabetic neuropathy associated with diabetes mellitus due to underlying condition ([place]) 08/29/2009  . Vitamin D deficiency 08/29/2009  . Hyposmolality and/or hyponatremia 08/29/2009  . VISUAL ACUITY, DECREASED, LEFT EYE 08/29/2009  . PERIPHERAL VASCULAR DISEASE 08/29/2009  . SINUSITIS, CHRONIC 08/29/2009  . NEPHROLITHIASIS 08/29/2009  . FIBROMYALGIA 08/29/2009  . BICEPS TENDON RUPTURE, RIGHT 08/11/2009  . DIABETES MELLITUS, TYPE II 08/05/2009  . Hyperlipidemia 08/05/2009  . ANEMIA-NOS 08/05/2009  . ANXIETY 08/05/2009  . DEPRESSION 08/05/2009  . Chronic pain syndrome 08/05/2009  . Essential hypertension 08/05/2009  . GERD 08/05/2009  . BENIGN PROSTATIC HYPERTROPHY 08/05/2009  . [place] DISEASE, LUMBAR 08/05/2009  . OSTEOPOROSIS 08/05/2009    Past Surgical History:  Procedure Laterality Date  . CARDIAC CATHETERIZATION N/A 09/19/2015   Procedure: Left Heart Cath and Coronary Angiography;  Surgeon: Adrian Prows, MD;  Location: [facility];  Service: Cardiovascular;  Laterality: N/A;  . CORONARY ARTERY BYPASS GRAFT N/A 09/22/2015   Procedure: CORONARY ARTERY BYPASS GRAFTING (CABG) times four using  the right greater saphenous vein harvested endoscopically and the left internal mammary artery.  LIMA-LAD, SEQ SVG-DIAG & OM, SVG-PD.;  Surgeon: [name], MD;  Location: [facility];  Service: Open Heart Surgery;  Laterality: N/A;  . EYE SURGERY    . L foot open repair jones fracture  2010    5th metetarsal   . left ankle ganglion cyst  1976  . left cataract  1996   right - 1997  . left CTS  2006   R CTS - 2007  . left foot surgery  1998   R surgery/fracture - 1999  . left  wrist/hand fusion  2008  . [place], 2004  . NASAL SINUS SURGERY     multiple- x8. last 1997 with obliteration  . REPAIR THORACIC AORTA    . right hand fracture  1969  . ROTATOR CUFF REPAIR  2006   R, than repeat 2011, Dr Theda Sers  . TRIGGER FINGER RELEASE Left 11/11/2014   Procedure: LEFT LONG FINGER RELEASE TRIGGER FINGER/A-1 PULLEY;  Surgeon: Milly Jakob, MD;  Location: [facility];  Service: Orthopedics;  Laterality: Left;  . vocal surgery  1996       Home Medications    Prior to Admission medications   Medication Sig Start Date End Date Taking? Authorizing Provider  BAYER BREEZE 2 TEST DISK USE TO TEST BLOOD GLUCOSE THREE TIMES DAILY 02/26/15   Elayne Snare, MD  Blood Glucose Monitoring Suppl (BAYER BREEZE 2 SYSTEM) W/DEVICE KIT Use to check blood sugars once a day 04/05/13   Elayne Snare, MD  buPROPion (WELLBUTRIN XL) 300 MG 24 hr tablet Take 300 mg by mouth every morning.  10/12/11   Historical Provider, MD  carvedilol (COREG) 6.25 MG tablet Take 1 tablet by mouth daily. 10/19/10   Historical Provider, MD  Cholecalciferol (VITAMIN D) 1000 UNITS capsule Take 5,000 Units by mouth daily.     Historical Provider, MD  ciprofloxacin (CIPRO) 750 MG tablet Take 750 mg by mouth 2 (two) times daily. 12/06/16   Historical Provider, MD  clonazePAM (KLONOPIN) 1 MG tablet Take 1 mg by mouth 3 (three) times daily.     Historical Provider, MD  clopidogrel (PLAVIX) 75 MG tablet Take 75 mg by mouth daily.    Historical Provider, MD  fentaNYL (DURAGESIC - DOSED MCG/HR) 100 MCG/HR Place 1 patch onto the skin every 3 (three) days. 07/28/09   Historical Provider, MD  lisinopril (PRINIVIL,ZESTRIL) 2.5 MG tablet Take 1 tablet (2.5 mg total) by mouth daily. 11/27/15   Adrian Prows, MD  MICROLET LANCETS [place]  10/22/12   Historical Provider, MD  omeprazole (PRILOSEC) 40 MG capsule Take 1 capsule by mouth 2 (two) times daily. 09/08/09   Historical Provider, MD  oxyCODONE (ROXICODONE) 15 MG  immediate release tablet Take 15-30 mg by mouth every 4 (four) hours as needed for pain.    Historical Provider, MD  PROAIR HFA 108 (90 BASE) MCG/ACT inhaler  03/12/14   Historical Provider, MD  sitaGLIPtin (JANUVIA) 100 MG tablet Take 1 tablet (100 mg total) by mouth daily. 07/22/15   Dixie Dials, MD  tamsulosin (FLOMAX) 0.4 MG CAPS capsule Take 1 capsule by mouth 2 (two) times daily. 12/08/09   Historical Provider, MD  VITAMIN E PO Take 8,000 Units by mouth daily.    Historical Provider, MD    Family History Family History  Problem Relation Age of Onset  . Lung cancer Father     and grandmother  .  Diabetes Mother     DM, also sister and MGF   . Throat cancer      family hx - "bad living" - also lung CA, heart disease and renal failure      Social History Social History  Substance Use Topics  . Smoking status: Former Smoker    Packs/day: 2.00    Types: Cigarettes    Quit date: 11/06/1988  . Smokeless tobacco: Never Used  . Alcohol use No     Allergies   Ivp dye [iodinated diagnostic agents]; Penicillins; Sulfa antibiotics; Sulfonamide derivatives; and Propoxyphene n-acetaminophen   Review of Systems Review of Systems Ten systems reviewed and are negative for acute change, except as noted in the HPI.    Physical Exam Updated Vital Signs BP (!) 174/99   Pulse 74   Temp 98.1 F (36.7 C) (Oral)   Resp 20   SpO2 100%   Physical Exam  Constitutional: He is oriented to person, place, and time. He appears well-developed and well-nourished. No distress.  Nontoxic appearing and in NAD. Sleeping on initial assessment.  HENT:  Head: Normocephalic and atraumatic.  Eyes: Conjunctivae and EOM are normal. No scleral icterus.  Neck: Normal range of motion.  Cardiovascular: Normal rate, regular rhythm and intact distal pulses.   DP pulse 1+ in the LLE  Pulmonary/Chest: Effort normal. No respiratory distress. He has no wheezes.  Respirations even and unlabored  Musculoskeletal:  Normal range of motion. He exhibits edema.  Pitting edema to left foot associated with ulcer to the lateral aspect of the left 5th MTP. See image below. Warm to touch noted as well as erythema compared to the RLE. No drainage. Mild TTP.  Neurological: He is alert and oriented to person, place, and time. He exhibits normal muscle tone. Coordination normal.  GCS 15. Patient moving all extremities. Sensation to light touch intact. Ambulatory with unsteady gait; requires 1 person assist.  Skin: Skin is warm and dry. No rash noted. He is not diaphoretic. No erythema. No pallor.  Psychiatric: He has a normal mood and affect. His behavior is normal. His speech is not slurred.  Nursing note and vitals reviewed.    ED Treatments / Results  Labs (all labs ordered are listed, but only abnormal results are displayed) Labs Reviewed  BASIC METABOLIC PANEL - Abnormal; Notable for the following:       Result Value   Sodium 125 (*)    Chloride 94 (*)    Glucose, Bld 135 (*)    Calcium 8.4 (*)    All other components within normal limits  CBC - Abnormal; Notable for the following:    RBC 4.03 (*)    Hemoglobin 10.7 (*)    HCT 32.7 (*)    RDW 17.1 (*)    All other components within normal limits  SEDIMENTATION RATE - Abnormal; Notable for the following:    Sed Rate 30 (*)    All other components within normal limits  RAPID URINE DRUG SCREEN, HOSP PERFORMED - Abnormal; Notable for the following:    Opiates POSITIVE (*)    All other components within normal limits  HEPATIC FUNCTION PANEL - Abnormal; Notable for the following:    Total Bilirubin 0.2 (*)    Bilirubin, Direct <0.1 (*)    All other components within normal limits  CBG MONITORING, ED - Abnormal; Notable for the following:    Glucose-Capillary 121 (*)    All other components within normal limits  AEROBIC CULTURE (SUPERFICIAL  SPECIMEN)  URINALYSIS, ROUTINE W REFLEX MICROSCOPIC  C-REACTIVE PROTEIN  AMMONIA  I-STAT CG4 LACTIC ACID, ED     EKG  EKG Interpretation  Date/Time:  Saturday December 11 2016 19:09:26 EDT Ventricular Rate:  83 PR Interval:  160 QRS Duration: 106 QT Interval:  382 QTC Calculation: 448 R Axis:   -6 Text Interpretation:  Normal sinus rhythm Minimal voltage criteria for LVH, may be normal variant Inferior infarct , age undetermined Cannot rule out Anterior infarct , age undetermined Abnormal ECG since last tracing no significant change Confirmed by Eulis Foster  MD, ELLIOTT [phone]) on 12/11/2016 10:58:55 PM       Radiology Mr Brain Wo Contrast  Result Date: 12/12/2016 CLINICAL DATA:  Altered mental status for 1 week, dysarthria. History of hypertension, hyperlipidemia, diabetes, LEFT eye blindness. EXAM: MRI HEAD WITHOUT CONTRAST TECHNIQUE: Multiplanar, multiecho pulse sequences of the brain and surrounding structures were obtained without intravenous contrast. COMPARISON:  CT HEAD May 16, 2014 FINDINGS: BRAIN: No reduced diffusion to suggest acute ischemia. No susceptibility artifact to suggest hemorrhage. The ventricles and sulci are normal for patient's age. Patchy supratentorial white matter T2 hyperintensities. No suspicious parenchymal signal, masses or mass effect. No abnormal extra-axial fluid collections. VASCULAR: Normal major intracranial vascular flow voids present at skull base. SKULL AND UPPER CERVICAL SPINE: No abnormal sellar expansion. No suspicious calvarial bone marrow signal. Old craniofacial surgery better seen on prior CT. Craniocervical junction maintained. SINUSES/ORBITS: Chronic paranasal sinusitis. Trace LEFT mastoid effusion. The included ocular globes and orbital contents are non-suspicious. Status post bilateral ocular lens implants. OTHER: None. IMPRESSION: No acute intracranial process. Involutional changes and moderate chronic small vessel ischemic disease. Electronically Signed   By: Elon Alas M.D.   On: 12/12/2016 03:56   Dg Foot Complete Left  Result Date:  12/11/2016 CLINICAL DATA:  Pt has wound on lateral side of base of 5th toe. Pt very lethargic and cannot stay awake. EXAM: LEFT FOOT - COMPLETE 3+ VIEW COMPARISON:  12/06/2016 FINDINGS: No fracture.  No bone lesion. A single screw extends from the base of fifth metatarsal to the midshaft, stable from the prior study and well-seated. There is no bone resorption to suggest osteomyelitis. The joints are normally spaced and aligned. No significant arthropathic change. There is soft tissue swelling lateral to the fifth metatarsophalangeal joint. No soft tissue air. IMPRESSION: 1. No fracture, dislocation or evidence of osteomyelitis. Electronically Signed   By: Lajean Manes M.D.   On: 12/11/2016 21:13    Procedures Procedures (including critical care time)  Medications Ordered in ED Medications  carvedilol (COREG) tablet 6.25 mg (6.25 mg Oral Given 12/12/16 0616)  lisinopril (PRINIVIL,ZESTRIL) tablet 2.5 mg (2.5 mg Oral Given 12/12/16 0616)  LORazepam (ATIVAN) injection 0.5 mg (0.5 mg Intravenous Given 12/12/16 0245)     5:15 AM - Patient states that his wound care doctor has started him on antibiotics. Patient reports compliance with this medication.    Initial Impression / Assessment and Plan / ED Course  I have reviewed the triage vital signs and the nursing notes.  Pertinent labs & imaging results that were available during my care of the patient were reviewed by me and considered in my medical decision making (see chart for details).    8:30 PM Patient examined. Very sleepy and will fall back to sleep when not aroused. Patient with no c/o pain. Physical exam with no significant findings. Initial labs reassuring, remainder pending. Xray also added for evaluation of L foot ulcer to ensure  no osteomyelitis.  11:30 PM Patient assessed by my attending, Dr. Eulis Foster who noted the patient to be dysarthric. Patient up to ambulate and gait noted to be unsteady. Case discussed further with Dr. Eulis Foster.  Plan for MRI to r/o stroke. Friend now at bedside. States that patient started to drool out of the corner of his mouth and "slump in his chair" while at [facility] at 1400. Friend gave food and slice of coconut cream pie assuming symptoms were 2/2 low sugar levels, but found that this provided no improvement. At this time patient was brought to the ED for evaluation.  4:30 AM Patient has continued to sleep. MRI reviewed. Negative for stroke. Patient reassessed. He is much easier to awaken. His speech is clear and patient states that he is feeling better. He was ambulated again with much more steady gait, requiring no assistance. He does endorse feeling not like himself earlier, noting speech changes; denies this now. He has drank 2 cans of ginger ale without difficulty.  Case discussed with neurology given transient AMS. Neurologist, Dr. Cheral Marker, does not believe that patient's symptoms sound c/w TIA. Length of AMS - approximately 8-10 hours - seems more related to possible pharmacologic cause of symptoms given home medication profile. Dr. Cheral Marker also states that symptoms associated with TIA are usually much more brief in duration, then showing gradual improvement over time. In light of patient's reassuring MRI and laboratory work up, Dr. Cheral Marker believes outpatient follow up is reasonable. He encourages the patient discuss changes to his home medications since he is on multiple sedating agents.  Plan discussed the plan with the patient who is comfortable with discharge. The patient has been instructed to closely follow up with his PCP and to return to the ED for new or concerning symptoms. Return precautions discussed and provided. Patient discharged in stable condition with no unaddressed concerns.   Final Clinical Impressions(s) / ED Diagnoses   Final diagnoses:  Altered mental status, unspecified altered mental status type    New Prescriptions New Prescriptions   No medications on file      Antonietta Breach, PA-C 12/12/16 [place], MD 12/12/16 1159

## 2016-12-11 NOTE — ED Triage Notes (Signed)
Pt brought in by family friend; pt from home alone; pt has recent infected wound on left foot and presents with boot on; pt is A&ox 4 at times but then pt nods off to sleep; pt c/o of slight pain in left right shoulder blade; Friends states pt just has not been his self over the last week and has not left home since he went to [facility] for wound check on Monday; All vitals WNL; pt has hx of DM  and HTN;

## 2016-12-11 NOTE — ED Provider Notes (Signed)
  Face-to-face evaluation   History: Patient was sitting in a chair, slumped over and ruled, and his friend stimulated him to arouse him.  His friend thought this was unusual, so brought him here.  Physical exam: Elderly man sleeping, aroused but was confused.  He is dysarthric.  He moves all extremities equally.  Small wound left lateral forefoot, appears to be a pressure sore without significant drainage bleeding or swelling.  Medical screening examination/treatment/procedure(s) were conducted as a shared visit with non-physician practitioner(s) and myself.  I personally evaluated the patient during the encounter   Daleen Bo, MD 12/12/16 1159

## 2016-12-12 ENCOUNTER — Emergency Department (HOSPITAL_COMMUNITY): Payer: Medicare Other

## 2016-12-12 DIAGNOSIS — R4182 Altered mental status, unspecified: Secondary | ICD-10-CM | POA: Diagnosis not present

## 2016-12-12 LAB — HEPATIC FUNCTION PANEL
ALBUMIN: 3.5 g/dL (ref 3.5–5.0)
ALK PHOS: 70 U/L (ref 38–126)
ALT: 19 U/L (ref 17–63)
AST: 33 U/L (ref 15–41)
BILIRUBIN TOTAL: 0.2 mg/dL — AB (ref 0.3–1.2)
Bilirubin, Direct: <0.1 mg/dL — ABNORMAL LOW (ref 0.1–0.5)
Total Protein: 7 g/dL (ref 6.5–8.1)

## 2016-12-12 LAB — AMMONIA: Ammonia: 26 umol/L (ref 9–35)

## 2016-12-12 MED ORDER — CARVEDILOL 12.5 MG PO TABS
6.2500 mg | ORAL_TABLET | Freq: Every day | ORAL | Status: DC
  Administered 2016-12-12: 6.25 mg via ORAL
  Filled 2016-12-12: qty 1

## 2016-12-12 MED ORDER — LISINOPRIL 2.5 MG PO TABS
2.5000 mg | ORAL_TABLET | Freq: Every day | ORAL | Status: DC
  Administered 2016-12-12: 2.5 mg via ORAL
  Filled 2016-12-12: qty 1

## 2016-12-12 MED ORDER — LORAZEPAM 2 MG/ML IJ SOLN
0.5000 mg | Freq: Once | INTRAMUSCULAR | Status: AC
  Administered 2016-12-12: 0.5 mg via INTRAVENOUS
  Filled 2016-12-12: qty 1

## 2016-12-12 NOTE — Discharge Instructions (Signed)
Your MRI did not show evidence of a stroke. Your blood work was also reassuring. We advise that you call your primary care doctor on Monday to schedule close follow-up. We advise, also, that you discuss with your primary doctor lowering your daily dose of pain medication as this may have contributed to your symptoms today. Continue taking your daily medications. If you experience any new or worsening symptoms, promptly return to the emergency department.

## 2016-12-16 LAB — AEROBIC CULTURE W GRAM STAIN (SUPERFICIAL SPECIMEN)
Gram Stain: NONE SEEN
Special Requests: NORMAL

## 2016-12-16 LAB — AEROBIC CULTURE  (SUPERFICIAL SPECIMEN)

## 2016-12-17 ENCOUNTER — Telehealth: Payer: Self-pay

## 2016-12-17 NOTE — Telephone Encounter (Signed)
Post ED Visit - Positive Culture Follow-up  Culture report reviewed by antimicrobial stewardship pharmacist:  []  Elenor Quinones, Pharm.D. [x]  Heide Guile, Pharm.D., BCPS AQ-ID []  Parks Neptune, Pharm.D., BCPS []  Alycia Rossetti, Pharm.D., BCPS []  [place], Pharm.D., BCPS, AAHIVP []  Legrand Como, Pharm.D., BCPS, AAHIVP []  Salome Arnt, PharmD, BCPS []  Dimitri Ped, PharmD, BCPS []  Vincenza Hews, PharmD, BCPS  Positive aerobic culture Treated with Ciprofloxacin, organism sensitive to the same and no further patient follow-up is required at this time.  Genia Del 12/17/2016, 8:58 AM

## 2017-01-05 ENCOUNTER — Encounter: Payer: Self-pay | Admitting: Surgery

## 2017-01-17 ENCOUNTER — Encounter: Payer: Self-pay | Admitting: Surgery

## 2017-01-17 ENCOUNTER — Ambulatory Visit (HOSPITAL_COMMUNITY)
Admission: RE | Admit: 2017-01-17 | Discharge: 2017-01-17 | Disposition: A | Discharge status: Home / Self Care | Payer: Medicare Other | Source: Ambulatory Visit | Attending: Surgery | Admitting: Surgery

## 2017-01-17 ENCOUNTER — Ambulatory Visit: Payer: PRIVATE HEALTH INSURANCE | Admitting: Surgery

## 2017-01-17 ENCOUNTER — Ambulatory Visit (INDEPENDENT_AMBULATORY_CARE_PROVIDER_SITE_OTHER)
Admission: RE | Admit: 2017-01-17 | Discharge: 2017-01-17 | Disposition: A | Discharge status: Home / Self Care | Payer: Medicare Other | Source: Ambulatory Visit | Attending: Surgery | Admitting: Surgery

## 2017-01-17 VITALS — BP 86/62 | HR 76 | Temp 97.6°F | Resp 16 | Ht 72.0 in | Wt 194.0 lb

## 2017-01-17 DIAGNOSIS — I6523 Occlusion and stenosis of bilateral carotid arteries: Secondary | ICD-10-CM

## 2017-01-17 DIAGNOSIS — E1151 Type 2 diabetes mellitus with diabetic peripheral angiopathy without gangrene: Secondary | ICD-10-CM | POA: Diagnosis not present

## 2017-01-17 DIAGNOSIS — Z95828 Presence of other vascular implants and grafts: Secondary | ICD-10-CM | POA: Diagnosis not present

## 2017-01-17 DIAGNOSIS — I70211 Atherosclerosis of native arteries of extremities with intermittent claudication, right leg: Secondary | ICD-10-CM | POA: Diagnosis not present

## 2017-01-17 DIAGNOSIS — I10 Essential (primary) hypertension: Secondary | ICD-10-CM | POA: Insufficient documentation

## 2017-01-17 DIAGNOSIS — I771 Stricture of artery: Secondary | ICD-10-CM

## 2017-01-17 LAB — VAS US CAROTID
LCCADSYS: -106 cm/s
LCCAPDIAS: 14 cm/s
LCCAPSYS: 91 cm/s
LEFT ECA DIAS: 0 cm/s
LEFT VERTEBRAL DIAS: 20 cm/s
LICADDIAS: -20 cm/s
LICAPDIAS: -23 cm/s
LICAPSYS: -110 cm/s
Left CCA dist dias: -11 cm/s
Left ICA dist sys: -83 cm/s
RCCAPDIAS: 20 cm/s
RCCAPSYS: 124 cm/s
RIGHT CCA MID DIAS: -16 cm/s
RIGHT ECA DIAS: -8 cm/s
Right cca dist sys: -126 cm/s

## 2017-01-17 LAB — VAS US AORTA/IVC/ILIACS: LSFPPSV: 485 cm/s

## 2017-01-19 NOTE — Progress Notes (Signed)
Appointment cancelled

## 2017-01-20 ENCOUNTER — Encounter: Payer: Self-pay | Admitting: Surgery

## 2017-01-24 LAB — HM DIABETES FOOT EXAM: HM DIABETIC FOOT EXAM: NORMAL

## 2017-01-26 ENCOUNTER — Ambulatory Visit: Payer: PRIVATE HEALTH INSURANCE | Admitting: Surgery

## 2017-01-31 LAB — PSA: PSA: 0.54

## 2017-01-31 LAB — LIPID PANEL
Cholesterol: 125 (ref 0–200)
Cholesterol: 125 (ref 0–200)
HDL: 45 (ref 35–70)
HDL: 45 (ref 35–70)
LDL CALC: 81
LDL Cholesterol: 81
Triglycerides: 59 (ref 40–160)
Triglycerides: 59 (ref 40–160)

## 2017-01-31 LAB — BASIC METABOLIC PANEL
BUN: 11 (ref 4–21)
BUN: 11 (ref 4–21)
CREATININE: 0.8 (ref 0.6–1.3)
Creatinine: 0.8 (ref 0.6–1.3)
GLUCOSE: 113
GLUCOSE: 113
POTASSIUM: 4.6 (ref 3.4–5.3)
Potassium: 4.6 (ref 3.4–5.3)
SODIUM: 130 — AB (ref 137–147)
Sodium: 130 — AB (ref 137–147)

## 2017-01-31 LAB — HEMOGLOBIN A1C
Hemoglobin A1C: 6.2
Hemoglobin A1C: 6.2

## 2017-01-31 LAB — HEPATIC FUNCTION PANEL
ALK PHOS: 67 (ref 25–125)
ALT: 16 (ref 10–40)
ALT: 16 (ref 10–40)
AST: 22 (ref 14–40)
AST: 22 (ref 14–40)
Alkaline Phosphatase: 67 (ref 25–125)
BILIRUBIN, TOTAL: 0.4
BILIRUBIN, TOTAL: 0.4

## 2017-05-09 ENCOUNTER — Encounter: Payer: Self-pay | Admitting: Surgery

## 2017-05-09 ENCOUNTER — Ambulatory Visit (INDEPENDENT_AMBULATORY_CARE_PROVIDER_SITE_OTHER): Payer: PRIVATE HEALTH INSURANCE | Admitting: Surgery

## 2017-05-09 VITALS — BP 153/65 | HR 67 | Temp 96.8°F | Resp 18 | Ht 72.0 in | Wt 206.0 lb

## 2017-05-09 DIAGNOSIS — I771 Stricture of artery: Secondary | ICD-10-CM

## 2017-05-09 DIAGNOSIS — I6523 Occlusion and stenosis of bilateral carotid arteries: Secondary | ICD-10-CM | POA: Diagnosis not present

## 2017-05-09 DIAGNOSIS — I70211 Atherosclerosis of native arteries of extremities with intermittent claudication, right leg: Secondary | ICD-10-CM

## 2017-05-09 NOTE — Progress Notes (Signed)
[facility] of [facility]  Patient name: [name] MRN: [id number] DOB: 13-Jan-1946 Sex: male   REASON FOR VISIT:    Follow up  HISOTRY OF PRESENT ILLNESS:    [name] is a 71 y.o. male who is status post aortobifemoral bypass graft as well as left carotid endarterectomy in 2012. He began complaining of leg swelling as well as prominent varicosities. and underwent laser ablation of the right small saphenous and left great saphenous vein 4 swelling and pain.  This had a significant improvement in his leg swelling.However, he is now having worsening swelling.  He is not wearing compression stockings.  He is s/p CABG in 2017.  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  The patient was felt to be at too great of risk to undergo concomitant innominate revascularization and CABG.  He is asymptomatic from his innominate stenosis.  He does not have any significant weakness in his right arm although he does state that it is sometimes more fatigue than the left.  He has not had any TIA or stroke symptoms recently.  He had his ultrasound studies in June, but is now here for this visit  PAST MEDICAL HISTORY:   Past Medical History:  Diagnosis Date  . Anemia    NOS  . Anxiety   . 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 BPH (benign prostatic hypertrophy)   . Broken foot Oct. 12, 2013   Right foot Fx  . Chronic hyponatremia   . Chronic pain syndrome   . Depression   . Diabetes mellitus   . DJD (degenerative joint disease)    L wrist  . DM2 (diabetes mellitus, type 2) ([place])   . GERD (gastroesophageal reflux disease)   . HLD (hyperlipidemia)   . HTN (hypertension)   . Lumbar disc disease   . Osteoporosis    tx per [facility], Dr Prudencio Burly, thought due to heavy steriod use after 1978  . Spine fracture    hx, multiple  .  Varicose veins      FAMILY HISTORY:   Family History  Problem Relation Age of Onset  . Lung cancer Father        and grandmother  . Diabetes Mother        DM, also sister and MGF   . Throat cancer Unknown        family hx - "bad living" - also lung CA, heart disease and renal failure      SOCIAL HISTORY:   Social History  Substance Use Topics  . Smoking status: Former Smoker    Packs/day: 2.00    Types: Cigarettes    Quit date: 11/06/1988  . Smokeless tobacco: Never Used  . Alcohol use No     ALLERGIES:   Allergies  Allergen Reactions  . Ivp Dye [Iodinated Diagnostic Agents]     Blood Pressure dropped----pt was pre-medicated with 13 hour prep and did fine with pre-meds--amy 03/09/11   . Penicillins Nausea Only    REACTION: gi upset  . Sulfa Antibiotics Hives  . Sulfonamide Derivatives Hives and Itching  . Propoxyphene N-Acetaminophen Other (See Comments)    Sharp pains- headache     CURRENT MEDICATIONS:   Current Outpatient Prescriptions  Medication Sig Dispense Refill  . buPROPion (WELLBUTRIN XL) 300 MG 24 hr tablet Take 300 mg by mouth every morning.     Marland Kitchen  celecoxib (CELEBREX) 200 MG capsule Take by mouth.    . Cholecalciferol (VITAMIN D) 1000 UNITS capsule Take 5,000 Units by mouth daily.     . clonazePAM (KLONOPIN) 1 MG tablet Take 1 mg by mouth daily.     . clopidogrel (PLAVIX) 75 MG tablet Take 75 mg by mouth daily.    . cyclobenzaprine (FLEXERIL) 10 MG tablet TK 1 T PO TID PRN  0  . fentaNYL (DURAGESIC - DOSED MCG/HR) 100 MCG/HR Place 50 mcg onto the skin every 3 (three) days.     . Ferrous Sulfate (IRON) 325 (65 Fe) MG TABS Take by mouth.    Marland Kitchen HYDROmorphone (DILAUDID) 2 MG tablet Take by mouth every 6 (six) hours as needed for severe pain.    Marland Kitchen lisinopril (PRINIVIL,ZESTRIL) 2.5 MG tablet Take 1 tablet (2.5 mg total) by mouth daily. 30 tablet 1  . MICROLET LANCETS MISC     . omeprazole (PRILOSEC) 40 MG capsule Take 1 capsule by mouth 2 (two) times  daily.    Marland Kitchen PROAIR HFA 108 (90 BASE) MCG/ACT inhaler     . sitaGLIPtin (JANUVIA) 100 MG tablet Take 1 tablet (100 mg total) by mouth daily. 30 tablet 3  . tamsulosin (FLOMAX) 0.4 MG CAPS capsule Take 1 capsule by mouth 2 (two) times daily.    Marland Kitchen VITAMIN E PO Take 8,000 Units by mouth daily.    . carvedilol (COREG) 6.25 MG tablet Take 1 tablet by mouth daily.     No current facility-administered medications for this visit.     REVIEW OF SYSTEMS:   [X]  denotes positive finding, [ ]  denotes negative finding Cardiac  Comments:  Chest pain or chest pressure:    Shortness of breath upon exertion:    Short of breath when lying flat:    Irregular heart rhythm:        Vascular    Pain in calf, thigh, or hip brought on by ambulation: x   Pain in feet at night that wakes you up from your sleep:     Blood clot in your veins:    Leg swelling:  x       Pulmonary    Oxygen at home:    Productive cough:     Wheezing:  x       Neurologic    Sudden weakness in arms or legs:     Sudden numbness in arms or legs:     Sudden onset of difficulty speaking or slurred speech:    Temporary loss of vision in one eye:     Problems with dizziness:  x       Gastrointestinal    Blood in stool:     Vomited blood:         Genitourinary    Burning when urinating:     Blood in urine:        Psychiatric    Major depression:  x       Hematologic    Bleeding problems:    Problems with blood clotting too easily:        Skin    Rashes or ulcers:        Constitutional    Fever or chills:      PHYSICAL EXAM:   Vitals:   05/09/17 1421  BP: (!) 153/65  Pulse: 67  Resp: 18  Temp: (!) 96.8 F (36 C)  TempSrc: Oral  SpO2: 99%  Weight: 206 lb (93.4 kg)  Height: 6' (1.829  m)    GENERAL: The patient is a well-nourished male, in no acute distress. The vital signs are documented above. CARDIAC: There is a regular rate and rhythm.  VASCULAR: Palpable femoral pulses.  I cannot palpate pedal  pulses.  There is bilateral edema right greater than left.  Prominent varicosities. PULMONARY: Non-labored respirations ABDOMEN: Soft and non-tender MUSCULOSKELETAL: There are no major deformities or cyanosis. NEUROLOGIC: No focal weakness or paresthesias are detected. SKIN: There are no ulcers or rashes noted. PSYCHIATRIC: The patient has a normal affect.  STUDIES:   ABI on the right is 1.15.  The left is 0.69. Less than 40% right carotid stenosis.  Patent left carotid endarterectomy site  MEDICAL ISSUES:   Carotid stenosis: The patient has widely patent carotid arteries bilaterally with a widely patent left carotid endarterectomy site.  He'll follow up in one year with a repeat ultrasound PVD, lower extremity: Aortobifem is widely patent.  ABIs have decreased on the left, however he is asymptomatic.  I will continue to observe this.  He will be repeat studies in 1 year Innominate stenosis: The patient has significant discrepancy between his right and left brachial pressure, however he is asymptomatic.  Based on the characteristics of this lesion, I do not think that he is a good candidate for stenting.   he is asymptomatic, so continue to follow this   Annamarie Major, MD [facility] of [facility] [phone] Pager [phone]

## 2017-07-13 NOTE — Addendum Note (Signed)
Addended by: Lianne Cure A on: 07/13/2017 11:18 AM   Modules accepted: Orders

## 2017-07-15 DIAGNOSIS — M25471 Effusion, right ankle: Secondary | ICD-10-CM | POA: Diagnosis not present

## 2017-07-15 DIAGNOSIS — L03115 Cellulitis of right lower limb: Secondary | ICD-10-CM | POA: Diagnosis not present

## 2017-07-15 DIAGNOSIS — L539 Erythematous condition, unspecified: Secondary | ICD-10-CM | POA: Diagnosis not present

## 2017-07-15 DIAGNOSIS — R238 Other skin changes: Secondary | ICD-10-CM | POA: Diagnosis not present

## 2017-07-15 DIAGNOSIS — M79604 Pain in right leg: Secondary | ICD-10-CM | POA: Diagnosis not present

## 2017-07-28 LAB — HEMOGLOBIN A1C: Hemoglobin A1C: 6.5

## 2017-09-26 DIAGNOSIS — M810 Age-related osteoporosis without current pathological fracture: Secondary | ICD-10-CM | POA: Diagnosis not present

## 2017-10-06 DIAGNOSIS — M810 Age-related osteoporosis without current pathological fracture: Secondary | ICD-10-CM | POA: Diagnosis not present

## 2017-12-01 LAB — HEPATIC FUNCTION PANEL
ALT: 15 (ref 10–40)
AST: 23 (ref 14–40)
Alkaline Phosphatase: 70 (ref 25–125)
Bilirubin, Total: 0.2

## 2017-12-01 LAB — CBC AND DIFFERENTIAL
HCT: 41 (ref 41–53)
HEMOGLOBIN: 13.5 (ref 13.5–17.5)
Platelets: 191 (ref 150–399)
WBC: 5.3

## 2017-12-01 LAB — LIPID PANEL
CHOLESTEROL: 140 (ref 0–200)
HDL: 50 (ref 35–70)
LDL Cholesterol: 79
Triglycerides: 57 (ref 40–160)

## 2017-12-01 LAB — BASIC METABOLIC PANEL
BUN: 13 (ref 4–21)
Creatinine: 0.7 (ref 0.6–1.3)
Glucose: 116
POTASSIUM: 4.7 (ref 3.4–5.3)
SODIUM: 132 — AB (ref 137–147)

## 2017-12-01 LAB — HEMOGLOBIN A1C: Hemoglobin A1C: 6.6

## 2017-12-21 ENCOUNTER — Ambulatory Visit (INDEPENDENT_AMBULATORY_CARE_PROVIDER_SITE_OTHER): Payer: Medicare (Managed Care) | Admitting: Internal Medicine

## 2017-12-21 ENCOUNTER — Encounter: Payer: Self-pay | Admitting: Internal Medicine

## 2017-12-21 VITALS — BP 106/60 | HR 70 | Temp 97.9°F | Ht 72.0 in | Wt 220.0 lb

## 2017-12-21 DIAGNOSIS — E1151 Type 2 diabetes mellitus with diabetic peripheral angiopathy without gangrene: Secondary | ICD-10-CM

## 2017-12-21 DIAGNOSIS — E782 Mixed hyperlipidemia: Secondary | ICD-10-CM

## 2017-12-21 DIAGNOSIS — M8000XS Age-related osteoporosis with current pathological fracture, unspecified site, sequela: Secondary | ICD-10-CM | POA: Diagnosis not present

## 2017-12-21 DIAGNOSIS — I10 Essential (primary) hypertension: Secondary | ICD-10-CM

## 2017-12-21 DIAGNOSIS — L03115 Cellulitis of right lower limb: Secondary | ICD-10-CM | POA: Diagnosis not present

## 2017-12-21 DIAGNOSIS — F339 Major depressive disorder, recurrent, unspecified: Secondary | ICD-10-CM | POA: Diagnosis not present

## 2017-12-21 DIAGNOSIS — F411 Generalized anxiety disorder: Secondary | ICD-10-CM

## 2017-12-21 DIAGNOSIS — K219 Gastro-esophageal reflux disease without esophagitis: Secondary | ICD-10-CM | POA: Diagnosis not present

## 2017-12-21 DIAGNOSIS — G894 Chronic pain syndrome: Secondary | ICD-10-CM | POA: Diagnosis not present

## 2017-12-21 DIAGNOSIS — I6521 Occlusion and stenosis of right carotid artery: Secondary | ICD-10-CM

## 2017-12-21 DIAGNOSIS — I739 Peripheral vascular disease, unspecified: Secondary | ICD-10-CM | POA: Diagnosis not present

## 2017-12-21 MED ORDER — TAMSULOSIN HCL 0.4 MG PO CAPS: 0.4000 mg | ORAL_CAPSULE | Freq: Two times a day (BID) | ORAL | 1 refills | Status: DC

## 2017-12-21 MED ORDER — OMEPRAZOLE 40 MG PO CPDR: 40.0000 mg | DELAYED_RELEASE_CAPSULE | Freq: Every day | ORAL | 1 refills | Status: DC

## 2017-12-21 MED ORDER — ZONISAMIDE 50 MG PO CAPS: 150.0000 mg | ORAL_CAPSULE | Freq: Every day | ORAL | 1 refills | Status: DC

## 2017-12-21 NOTE — Progress Notes (Signed)
Patient ID: [name], male   DOB: [birth year], 72 y.o.   MRN: [id number]   Location:  [facility] OFFICE  Provider: DR Arletha Grippe  Code Status:  Goals of Care:  Advanced Directives 05/09/2017  Does Patient Have a Medical Advance Directive? No  Would patient like information on creating a medical advance directive? -     Chief Complaint  Patient presents with  . Establish Care    New Patient establish care, + fall risk, patient would like a review of all medications.   . Medication Management    Patient would like a review of allergy list, patient feels like somethings should be removed   . Medication Management    Patient is taking several of his medications different than what is listed on medication bottles, I undated medication list to reflect exactly what patient is taking     HPI: Patient is a 72 y.o. male seen today as a new pt. He is c/a leg swelling R>LLE. He is followed by cardio Dr Gwendel Hanson. He saw cardio last week for RLE edema and pain. He was dx with RLE cellulitis and Rx 14 days of keflex '750mg'$  BID. Pain redness and swelling improving. He was also given rx diuretic with K+. He is s/p CABG in 2017 and veins grafted for RLE. CT sx Dr Servando Snare.   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 He is s/p left CEA in 2015  and aortobifemoral bypass in 2012. ABIs decreased on LLE.  He needs med RF - flomax, omeprazole  Chronic pain syndrome with muscle spasms- pain meds Rx Dr Selinda Orion in [place] (fentanyl patch, dilaudid). Previous PCP rx zonegran which he takes for spasms along with prn flexeril  Recurrent MDD with anxiety - followed by psych Dr Donnal Moat He takes wellbutrin XL, klonopin.   RLS - followed by psych. Sx's began after klonopin dose reduced. He takes gabapentin and prn requip  CAD - followed by cardio Dr Einar Gip  DM - does not check BS at home as he does not have a meter. A1c usually < 7%. He takes [place]  Past  smoker - quit in 1990 after 30 yrs. Uses HFA 1-2 times per day. He worked in [place] for 8-9 yrs.   Chronic small vessel ischemic disease - MRI brain in 2018 revealed no acute process but showed chronic vessel disease  Osteoporosis with hx fx - no recent DXA  Past Medical History:  Diagnosis Date  . Anemia    NOS  . Anxiety   . 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 BPH (benign prostatic hypertrophy)   . Broken foot Oct. 12, 2013   Right foot Fx  . Chronic hyponatremia   . Chronic pain syndrome   . Depression   . Diabetes mellitus   . DJD (degenerative joint disease)    L wrist  . DM2 (diabetes mellitus, type 2) ([place])   . GERD (gastroesophageal reflux disease)   . HLD (hyperlipidemia)   . HTN (hypertension)   . Lumbar disc disease   . Osteoporosis    tx per [facility], Dr Prudencio Burly, thought due to heavy steriod use after 1978  . Spine fracture    hx, multiple  . Varicose veins     Past Surgical History:  Procedure Laterality Date  . CARDIAC CATHETERIZATION N/A 09/19/2015   Procedure: Left Heart Cath and Coronary Angiography;  Surgeon: Adrian Prows, MD;  Location:  [place] INVASIVE CV LAB;  Service: Cardiovascular;  Laterality: N/A;  . CARPAL TUNNEL RELEASE    . CORONARY ARTERY BYPASS GRAFT N/A 09/22/2015   Procedure: CORONARY ARTERY BYPASS GRAFTING (CABG) times four using the right greater saphenous vein harvested endoscopically and the left internal mammary artery.  LIMA-LAD, SEQ SVG-DIAG & OM, SVG-PD.;  Surgeon: [name], MD;  Location: [place];  Service: Open Heart Surgery;  Laterality: N/A;  . EYE SURGERY    . L foot open repair jones fracture  2010    5th metetarsal   . left ankle ganglion cyst  1976  . left cataract  1996   right - 1997  . left CTS  2006   R CTS - 2007  . left foot surgery  1998   R surgery/fracture - 1999  . left wrist/hand fusion  2008  . [place], 2004  . NASAL SINUS SURGERY     multiple- x8. last 1997 with  obliteration  . REPAIR THORACIC AORTA  2017  . right hand fracture  1969  . ROTATOR CUFF REPAIR  2006   R, than repeat 2011, Dr Theda Sers  . TRIGGER FINGER RELEASE Left 11/11/2014   Procedure: LEFT LONG FINGER RELEASE TRIGGER FINGER/A-1 PULLEY;  Surgeon: Milly Jakob, MD;  Location: [place];  Service: Orthopedics;  Laterality: Left;  . vocal surgery  1996     reports that he quit smoking about 29 years ago. His smoking use included cigarettes. He has a 30.00 pack-year smoking history. He has never used smokeless tobacco. He reports that he drank alcohol. He reports that he does not use drugs. Social History   Socioeconomic History  . Marital status: Divorced    Spouse name: Not on file  . Number of children: Not on file  . Years of education: Not on file  . Highest education level: Not on file  Occupational History  . Not on file  Social Needs  . Financial resource strain: Not on file  . Food insecurity:    Worry: Not on file    Inability: Not on file  . Transportation needs:    Medical: Not on file    Non-medical: Not on file  Tobacco Use  . Smoking status: Former Smoker    Packs/day: 1.50    Years: 20.00    Pack years: 30.00    Types: Cigarettes    Last attempt to quit: 11/06/1988    Years since quitting: 29.1  . Smokeless tobacco: Never Used  Substance and Sexual Activity  . Alcohol use: Not Currently    Comment: None in 30 years but drank beer before  . Drug use: No  . Sexual activity: Never  Lifestyle  . Physical activity:    Days per week: Not on file    Minutes per session: Not on file  . Stress: Not on file  Relationships  . Social connections:    Talks on phone: Not on file    Gets together: Not on file    Attends religious service: Not on file    Active member of club or organization: Not on file    Attends meetings of clubs or organizations: Not on file    Relationship status: Not on file  . Intimate partner violence:    Fear of  current or ex partner: Not on file    Emotionally abused: Not on file    Physically abused: Not on file    Forced sexual  activity: Not on file  Other Topics Concern  . Not on file  Social History Narrative   Social History      Diet? I eat about what I want.       Do you drink/eat things with caffeine? yes      Marital status?                  divorced                  What year were you married? 1966 to 2007      Do you live in a house, apartment, assisted living, condo, trailer, etc.? [place]      Is it one or more stories? one      How many persons live in your home? Me only      Do you have any pets in your home? (please list) no      Highest level of education completed? Finished high school      Current or past profession: Clinical biochemist in [place] you exercise?               no                       Type & how often?      Advanced Directives      Do you have a living will? no      Do you have a DNR form?     no                             If not, do you want to discuss one?      Do you have signed POA/HPOA for forms? No      Functional Status      Do you have difficulty bathing or dressing yourself? No- shower      Do you have difficulty preparing food or eating? No- Don't want to      Do you have difficulty managing your medications?      Do you have difficulty managing your finances?      Do you have difficulty affording your medications? No- paid for by [employer]    Family History  Problem Relation Age of Onset  . Lung cancer Father 73  . Diabetes Mother   . Osteoporosis Mother   . Throat cancer Unknown        family hx - "bad living" - also lung CA, heart disease and renal failure    . Diabetes Sister   . Suicidality Son 67  . Lung cancer Maternal Grandmother   . Diabetes Maternal Grandfather   . Heart attack Paternal Grandmother     Allergies  Allergen Reactions  . Ivp Dye [Iodinated Diagnostic Agents]     Blood  Pressure dropped----pt was pre-medicated with 13 hour prep and did fine with pre-meds--amy 03/09/11   . Penicillins Nausea Only    REACTION: gi upset  . Sulfa Antibiotics Hives  . Sulfonamide Derivatives Hives and Itching  . Lisinopril Cough  . Propoxyphene N-Acetaminophen Other (See Comments)    Sharp pains- headache    Outpatient Encounter Medications as of 12/21/2017  Medication Sig  . buPROPion (WELLBUTRIN XL) 150 MG 24 hr tablet Take 150 mg by mouth daily. Along with 300 mg to equal 450 mg daily  . buPROPion (WELLBUTRIN XL)  300 MG 24 hr tablet Take 300 mg by mouth every morning.   . celecoxib (CELEBREX) 200 MG capsule Take 200 mg by mouth 2 (two) times daily.   . cephALEXin (KEFLEX) 750 MG capsule 1 by mouth twice daily for cellulitis  . Cholecalciferol (VITAMIN D) 1000 UNITS capsule Take 5,000 Units by mouth daily.   . clonazePAM (KLONOPIN) 0.5 MG tablet Take 1 mg by mouth daily.  . clopidogrel (PLAVIX) 75 MG tablet Take 75 mg by mouth daily.  . collagenase (SANTYL) ointment Apply 1 application topically as needed (to promote skin growth over wound).  . cyclobenzaprine (FLEXERIL) 10 MG tablet TK 1 T PO TID PRN  . desonide (DESOWEN) 0.05 % cream Apply 1 application topically daily as needed ([place]).  . fentaNYL (DURAGESIC - DOSED MCG/HR) 50 MCG/HR Place 50 mcg onto the skin every 3 (three) days.  . furosemide (LASIX) 20 MG tablet Take 20 mg by mouth 2 (two) times daily.  Marland Kitchen gabapentin (NEURONTIN) 400 MG capsule Take 800 mg by mouth at bedtime.   Marland Kitchen HYDROmorphone (DILAUDID) 2 MG tablet Take by mouth every 6 (six) hours as needed for severe pain.  Marland Kitchen losartan (COZAAR) 25 MG tablet Take 25 mg by mouth daily.  . Multiple Vitamins-Iron (MULTIVITAMINS WITH IRON) TABS tablet Take 1 tablet by mouth daily.  . mupirocin cream (BACTROBAN) 2 % Apply 1 application topically as needed (Apply to wounds as needed on feet).  . nitroGLYCERIN (NITROSTAT) 0.4 MG SL tablet Place 0.4 mg under the tongue  every 5 (five) minutes as needed for chest pain.  Marland Kitchen omeprazole (PRILOSEC) 40 MG capsule Take 1 capsule by mouth 2 (two) times daily.  . potassium chloride (K-DUR) 10 MEQ tablet Take 10 mEq by mouth 2 (two) times daily.  Marland Kitchen PROAIR HFA 108 (90 BASE) MCG/ACT inhaler Inhale 2 puffs into the lungs every 6 (six) hours as needed.   Marland Kitchen rOPINIRole (REQUIP) 1 MG tablet Take 1-2 mg by mouth at bedtime.  . sitaGLIPtin (JANUVIA) 100 MG tablet Take 1 tablet (100 mg total) by mouth daily.  . tamsulosin (FLOMAX) 0.4 MG CAPS capsule Take 1 capsule by mouth 2 (two) times daily.  Marland Kitchen VITAMIN E PO Take 8,000 Units by mouth daily.  Marland Kitchen zonisamide (ZONEGRAN) 50 MG capsule Take 150 mg by mouth at bedtime.  . [DISCONTINUED] clonazePAM (KLONOPIN) 1 MG tablet Take 1 mg by mouth daily.   . [DISCONTINUED] fentaNYL (DURAGESIC - DOSED MCG/HR) 100 MCG/HR Place 50 mcg onto the skin every 3 (three) days.    No facility-administered encounter medications on file as of 12/21/2017.     Review of Systems:  Review of Systems  HENT: Negative for facial swelling.   Cardiovascular: Positive for leg swelling.  Musculoskeletal: Positive for arthralgias.  Skin: Positive for rash.  Psychiatric/Behavioral: Positive for dysphoric mood.  All other systems reviewed and are negative.   Health Maintenance  Topic Date Due  . Hepatitis C Screening  1946-01-13  . OPHTHALMOLOGY EXAM  04/08/1956  . COLONOSCOPY  04/08/1996  . PNA vac Low Risk Adult (1 of 2 - PCV13) 04/09/2011  . FOOT EXAM  11/08/2014  . INFLUENZA VACCINE  03/16/2018  . HEMOGLOBIN A1C  06/02/2018  . TETANUS/TDAP  10/18/2019    Physical Exam: Vitals:   12/21/17 0833  BP: 106/60  Pulse: 70  Temp: 97.9 F (36.6 C)  TempSrc: Oral  SpO2: 97%  Weight: 220 lb (99.8 kg)  Height: 6' (1.829 m)   Body mass index is  29.84 kg/m. Physical Exam  Constitutional: He is oriented to person, place, and time. He appears well-developed and well-nourished.  HENT:  Mouth/Throat:  Oropharynx is clear and moist.  MMM; no oral thrush  Eyes: Pupils are equal, round, and reactive to light. No scleral icterus.  Neck: Neck supple. Carotid bruit is present (right). No thyromegaly present.  Cardiovascular: Normal rate, regular rhythm and intact distal pulses. Exam reveals no gallop and no friction rub.  Murmur (1/6 SEM) heard. +1 pitting RLE edema. Trace LLE edema. No calf TTP  Pulmonary/Chest: Effort normal and breath sounds normal. He has no wheezes. He has no rales. He exhibits no tenderness.  Abdominal: Soft. Bowel sounds are normal. He exhibits no distension, no abdominal bruit, no pulsatile midline mass and no mass. There is no hepatomegaly. There is no tenderness. There is no rebound and no guarding.  Musculoskeletal: He exhibits edema.  Lymphadenopathy:    He has no cervical adenopathy.  Neurological: He is alert and oriented to person, place, and time. He has normal reflexes.  Skin: Skin is warm and dry. No rash noted. There is erythema (distal RLE with swelling).  Psychiatric: He has a normal mood and affect. His behavior is normal. Judgment and thought content normal.    Labs reviewed: Basic Metabolic Panel: Recent Labs    12/01/17  NA 132*  K 4.7  BUN 13  CREATININE 0.7   Liver Function Tests: Recent Labs    12/01/17  AST 23  ALT 15  ALKPHOS 70   No results for input(s): LIPASE, AMYLASE in the last 8760 hours. No results for input(s): AMMONIA in the last 8760 hours. CBC: Recent Labs    12/01/17  WBC 5.3  HGB 13.5  HCT 41  PLT 191   Lipid Panel: Recent Labs    12/01/17  CHOL 140  HDL 50  LDLCALC 79  TRIG 57   Lab Results  Component Value Date   HGBA1C 6.6 12/01/2017    Procedures since last visit: No results found.  Assessment/Plan   ICD-10-CM   1. Cellulitis of right leg L03.115    resolving  2. Diabetes mellitus type 2 with peripheral artery disease (HCC) E11.51 CMP with eGFR([facility])    Lipid Panel    Hemoglobin A1c     Microalbumin/Creatinine Ratio, Urine  3. Mixed hyperlipidemia E78.2 Lipid Panel   statin intolerant  4. Chronic pain syndrome G89.4   5. Gastroesophageal reflux disease, esophagitis presence not specified K21.9   6. Essential hypertension I10   7. Recurrent major depressive disorder, remission status unspecified (HCC) F33.9   8. GAD (generalized anxiety disorder) F41.1   9. Osteoporosis with current pathological fracture, unspecified osteoporosis type, sequela M80.00XS DG BONE DENSITY (DXA)  10. Stenosis of right carotid artery I65.21   11. PERIPHERAL VASCULAR DISEASE I73.9      Needs CT chest to further eval chronic cough and use of HFA daily. No formal w/u done in th past. He has worked in [place] x 8-9 yrs and father expired in his early 4s with black lung  Will call with bone density appt  Continue current medications as ordered  Follow up with specialists as scheduled  Get old records from Dr Lona Kettle (previous PCP)  Follow up in 3 mos for DM, leg edema, GERD, HTN. Fasting labs prior to appt.    [name] S. Perlie Gold  [facility] [address] [place], [place] [address] (  [phone] Cell (Monday-Friday 8 AM - 5 PM) [phone] After 5 PM and follow prompts

## 2017-12-21 NOTE — Patient Instructions (Addendum)
Will call with bone density appt  Continue current medications as ordered  Follow up with specialists as scheduled  Get old records from Dr Lona Kettle (previous PCP)  Follow up in 3 mos for DM, leg edema, GERD, HTN. Fasting labs prior to appt.

## 2017-12-22 ENCOUNTER — Encounter: Payer: Self-pay | Admitting: Internal Medicine

## 2017-12-23 ENCOUNTER — Ambulatory Visit: Payer: Medicare Other | Admitting: Internal Medicine

## 2017-12-28 ENCOUNTER — Ambulatory Visit: Payer: Medicare Other | Admitting: Internal Medicine

## 2018-01-17 ENCOUNTER — Ambulatory Visit (INDEPENDENT_AMBULATORY_CARE_PROVIDER_SITE_OTHER): Payer: Medicare (Managed Care) | Admitting: Nurse Practitioner

## 2018-01-17 ENCOUNTER — Encounter: Payer: Self-pay | Admitting: Nurse Practitioner

## 2018-01-17 ENCOUNTER — Ambulatory Visit
Admission: RE | Admit: 2018-01-17 | Discharge: 2018-01-17 | Disposition: A | Discharge status: Home / Self Care | Payer: PRIVATE HEALTH INSURANCE | Source: Ambulatory Visit | Attending: Nurse Practitioner | Admitting: Nurse Practitioner

## 2018-01-17 VITALS — BP 120/84 | HR 71 | Temp 97.9°F | Ht 72.0 in | Wt 220.2 lb

## 2018-01-17 DIAGNOSIS — E1151 Type 2 diabetes mellitus with diabetic peripheral angiopathy without gangrene: Secondary | ICD-10-CM

## 2018-01-17 DIAGNOSIS — I6521 Occlusion and stenosis of right carotid artery: Secondary | ICD-10-CM | POA: Diagnosis not present

## 2018-01-17 DIAGNOSIS — R0781 Pleurodynia: Secondary | ICD-10-CM | POA: Diagnosis not present

## 2018-01-17 LAB — GLUCOSE, POCT (MANUAL RESULT ENTRY): POC Glucose: 123 mg/dl — AB (ref 70–99)

## 2018-01-17 MED ORDER — GLUCOSE BLOOD VI STRP: 1.0000 | ORAL_STRIP | 6 refills | Status: AC | PRN

## 2018-01-17 MED ORDER — ACCU-CHEK AVIVA PLUS W/DEVICE KIT: PACK | 0 refills | Status: AC

## 2018-01-17 MED ORDER — ACCU-CHEK FASTCLIX LANCETS MISC: 6 refills | Status: AC

## 2018-01-17 NOTE — Addendum Note (Signed)
Addended by: Denyse Amass on: 01/17/2018 02:24 PM   Modules accepted: Orders

## 2018-01-17 NOTE — Progress Notes (Signed)
Careteam: Patient Care Team: [name], DO as PCP - General (Internal Medicine) Serafina Mitchell, MD as Consulting Physician (Vascular Surgery) Adrian Prows, MD as Consulting Physician (Cardiology) Jana Half, DPM as Consulting Physician (Podiatry)  Advanced Directive information Does Patient Have a Medical Advance Directive?: No  Allergies  Allergen Reactions  . Ivp Dye [Iodinated Diagnostic Agents]     Blood Pressure dropped----pt was pre-medicated with 13 hour prep and did fine with pre-meds--amy 03/09/11   . Penicillins Nausea Only    REACTION: gi upset  . Sulfa Antibiotics Hives  . Sulfonamide Derivatives Hives and Itching  . Lisinopril Cough  . Statins     myalgias  . Propoxyphene N-Acetaminophen Other (See Comments)    Sharp pains- headache    Chief Complaint  Patient presents with  . Acute Visit    Pt fell over a week ago and landed on his left chest/side. pt has had pain and pressure in chest to back since. pain becomes sharp with taking a deep breath.      HPI: Patient is a 72 y.o. male seen in the office today pain in the chest from fall 1 week ago. Thought it would get better by now. He was taking 2-3 towels from the bathroom to the laundry room and tripped over the towels and fell. Had his fist balled up and feels like that was between his chest and the floor. When he tries to take a deep breath the pain makes him stop.  8/10 when he is taking this deep breath, otherwise a 4/5. Pain has not improved at all since fall. Pt with hx of osteoporosis  Currently on fentanyl patch 50 mcg every 3 days due to facial pain and chronic back pain and taking dilaudid every 6 hours as needed. Had some left over from last month so he took that for this due to the severity of the pain.  Pain clinic manages pain and prescribes medication.   Review of Systems:  Review of Systems  Constitutional: Positive for diaphoresis. Negative for chills, fever and malaise/fatigue.    Respiratory: Negative for cough, sputum production and shortness of breath.   Cardiovascular: Positive for chest pain (from fall).    Past Medical History:  Diagnosis Date  . Anemia    NOS  . Anxiety   . 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 BPH (benign prostatic hypertrophy)   . Broken foot Oct. 12, 2013   Right foot Fx  . CAD (coronary artery disease)   . Chronic hyponatremia   . Chronic pain syndrome   . COPD (chronic obstructive pulmonary disease) ([place])   . Depression   . Diabetes mellitus   . Diabetic foot ulcer ([place])   . DJD (degenerative joint disease)    L wrist  . DM2 (diabetes mellitus, type 2) ([place])   . GERD (gastroesophageal reflux disease)   . HLD (hyperlipidemia)   . HTN (hypertension)   . Lumbar disc disease   . Osteoporosis    tx per [facility], Dr Prudencio Burly, thought due to heavy steriod use after 1978  . Spine fracture    hx, multiple  . Varicose veins    Past Surgical History:  Procedure Laterality Date  . CARDIAC CATHETERIZATION N/A 09/19/2015   Procedure: Left Heart Cath and Coronary Angiography;  Surgeon: Adrian Prows, MD;  Location: [facility];  Service: Cardiovascular;  Laterality: N/A;  . CARPAL TUNNEL RELEASE    .  CORONARY ARTERY BYPASS GRAFT N/A 09/22/2015   Procedure: CORONARY ARTERY BYPASS GRAFTING (CABG) times four using the right greater saphenous vein harvested endoscopically and the left internal mammary artery.  LIMA-LAD, SEQ SVG-DIAG & OM, SVG-PD.;  Surgeon: [name], MD;  Location: [facility];  Service: Open Heart Surgery;  Laterality: N/A;  . EYE SURGERY    . L foot open repair jones fracture  2010    5th metetarsal   . left ankle ganglion cyst  1976  . left cataract  1996   right - 1997  . left CTS  2006   R CTS - 2007  . left foot surgery  1998   R surgery/fracture - 1999  . left wrist/hand fusion  2008  . [place], 2004  . NASAL SINUS SURGERY     multiple- x8. last 1997 with obliteration  .  REPAIR THORACIC AORTA  2017  . right hand fracture  1969  . ROTATOR CUFF REPAIR  2006   R, than repeat 2011, Dr Theda Sers  . TRIGGER FINGER RELEASE Left 11/11/2014   Procedure: LEFT LONG FINGER RELEASE TRIGGER FINGER/A-1 PULLEY;  Surgeon: Milly Jakob, MD;  Location: [facility];  Service: Orthopedics;  Laterality: Left;  . vocal surgery  1996   Social History:   reports that he quit smoking about 29 years ago. His smoking use included cigarettes. He has a 30.00 pack-year smoking history. He has never used smokeless tobacco. He reports that he drank alcohol. He reports that he does not use drugs.  Family History  Problem Relation Age of Onset  . Lung cancer Father 49  . Diabetes Mother   . Osteoporosis Mother   . Throat cancer Unknown        family hx - "bad living" - also lung CA, heart disease and renal failure    . Diabetes Sister   . Suicidality Son 9  . Lung cancer Maternal Grandmother   . Diabetes Maternal Grandfather   . Heart attack Paternal Grandmother     Medications: Patient's Medications  New Prescriptions   No medications on file  Previous Medications   BUPROPION (WELLBUTRIN XL) 150 MG 24 HR TABLET    Take 150 mg by mouth daily. Along with 300 mg to equal 450 mg daily   BUPROPION (WELLBUTRIN XL) 300 MG 24 HR TABLET    Take 300 mg by mouth every morning.    CELECOXIB (CELEBREX) 200 MG CAPSULE    Take 200 mg by mouth 2 (two) times daily.    CHOLECALCIFEROL (VITAMIN D) 1000 UNITS CAPSULE    Take 5,000 Units by mouth daily.    CLONAZEPAM (KLONOPIN) 0.5 MG TABLET    Take 1 mg by mouth daily.   CLOPIDOGREL (PLAVIX) 75 MG TABLET    Take 75 mg by mouth daily.   CYCLOBENZAPRINE (FLEXERIL) 10 MG TABLET    TK 1 T PO TID PRN   DESONIDE (DESOWEN) 0.05 % CREAM    Apply 1 application topically daily as needed ([place]).   FENTANYL (DURAGESIC - DOSED MCG/HR) 50 MCG/HR    Place 50 mcg onto the skin every 3 (three) days.   FUROSEMIDE (LASIX) 20 MG TABLET    Take 20 mg  by mouth 2 (two) times daily.   GABAPENTIN (NEURONTIN) 400 MG CAPSULE    Take 800 mg by mouth at bedtime.    HYDROMORPHONE (DILAUDID) 2 MG TABLET    Take by mouth every 6 (six) hours as  needed for severe pain.   LOSARTAN (COZAAR) 25 MG TABLET    Take 25 mg by mouth daily.   MULTIPLE VITAMINS-IRON (MULTIVITAMINS WITH IRON) TABS TABLET    Take 1 tablet by mouth daily.   NITROGLYCERIN (NITROSTAT) 0.4 MG SL TABLET    Place 0.4 mg under the tongue every 5 (five) minutes as needed for chest pain.   OMEPRAZOLE (PRILOSEC) 40 MG CAPSULE    Take 1 capsule (40 mg total) by mouth daily.   POTASSIUM CHLORIDE (K-DUR) 10 MEQ TABLET    Take 10 mEq by mouth 2 (two) times daily.   PROAIR HFA 108 (90 BASE) MCG/ACT INHALER    Inhale 2 puffs into the lungs every 6 (six) hours as needed.    ROPINIROLE (REQUIP) 1 MG TABLET    Take 1-2 mg by mouth at bedtime.   SITAGLIPTIN (JANUVIA) 100 MG TABLET    Take 1 tablet (100 mg total) by mouth daily.   TAMSULOSIN (FLOMAX) 0.4 MG CAPS CAPSULE    Take 1 capsule (0.4 mg total) by mouth 2 (two) times daily.   VITAMIN E PO    Take 8,000 Units by mouth daily.   ZONISAMIDE (ZONEGRAN) 50 MG CAPSULE    Take 3 capsules (150 mg total) by mouth at bedtime.  Modified Medications   No medications on file  Discontinued Medications   CEPHALEXIN (KEFLEX) 750 MG CAPSULE    1 by mouth twice daily for cellulitis   COLLAGENASE (SANTYL) OINTMENT    Apply 1 application topically as needed (to promote skin growth over wound).   MUPIROCIN CREAM (BACTROBAN) 2 %    Apply 1 application topically as needed (Apply to wounds as needed on feet).     Physical Exam:  Vitals:   01/17/18 1322  BP: 120/84  Pulse: 71  Temp: 97.9 F (36.6 C)  TempSrc: Oral  SpO2: 97%  Weight: 220 lb 3.2 oz (99.9 kg)  Height: 6' (1.829 m)   Body mass index is 29.86 kg/m.  Physical Exam  Constitutional: He is oriented to person, place, and time. He appears well-developed and well-nourished.  HENT:  Mouth/Throat:  Oropharynx is clear and moist.  MMM; no oral thrush  Eyes: Pupils are equal, round, and reactive to light. No scleral icterus.  Neck: Neck supple. Carotid bruit is present (right). No thyromegaly present.  Cardiovascular: Normal rate and regular rhythm. Exam reveals no gallop and no friction rub.  Murmur (1/6 SEM) heard. Pulmonary/Chest: Effort normal and breath sounds normal.  Tenderness noted to multiple areas on left chest; no bruising noted     Abdominal: He exhibits no abdominal bruit and no pulsatile midline mass. There is no hepatomegaly.  Lymphadenopathy:    He has no cervical adenopathy.  Neurological: He is alert and oriented to person, place, and time. He has normal reflexes.  Skin: Skin is warm. No rash noted. There is pallor.  Cool and clammy, pt reports he feels well  Psychiatric: He has a normal mood and affect. His behavior is normal. Judgment and thought content normal.    Labs reviewed: Basic Metabolic Panel: Recent Labs    01/31/17 12/01/17  NA 130*  130* 132*  K 4.6  4.6 4.7  BUN 11  11 13   CREATININE 0.8  0.8 0.7   Liver Function Tests: Recent Labs    01/31/17 12/01/17  AST 22  22 23   ALT 16  16 15   ALKPHOS 67  67 70   No results for input(s): LIPASE,  AMYLASE in the last 8760 hours. No results for input(s): AMMONIA in the last 8760 hours. CBC: Recent Labs    12/01/17  WBC 5.3  HGB 13.5  HCT 41  PLT 191   Lipid Panel: Recent Labs    01/31/17 12/01/17  CHOL 125  125 140  HDL 45  45 50  LDLCALC 81  81 79  TRIG 59  59 57   TSH: No results for input(s): TSH in the last 8760 hours. A1C: Lab Results  Component Value Date   HGBA1C 6.6 12/01/2017     Assessment/Plan 1. Rib pain on left side -cont on celebrex 200 mg BID -pt already on fentanyl and Dilaudid per pain management -with hx of osteoporosis per records will get Chest 2 View xray to rule out fracture.  2. Diabetes mellitus type 2 with peripheral artery disease  ([place]) -pt clammy on today's visit. Reports he does not feel hypoglycemic, no fevers or chills,  POC Glucose (CBG) 129, he also reports he does not have home meter to check blood sugar. This was ordered today as well.   Next appt: 03/21/2018 Carlos American. [place], [facility] [phone]

## 2018-01-17 NOTE — Patient Instructions (Addendum)
To get chest xray to rule out fracture  Take pain medication as prescribed.   Glucose meter ordered to check blood sugars as needed

## 2018-01-25 ENCOUNTER — Other Ambulatory Visit: Payer: Self-pay | Admitting: *Deleted

## 2018-01-25 MED ORDER — CELECOXIB 200 MG PO CAPS: 200.0000 mg | ORAL_CAPSULE | Freq: Two times a day (BID) | ORAL | 1 refills | Status: DC

## 2018-01-25 NOTE — Telephone Encounter (Signed)
Patient requested refill sent to pharmacy.  [name] confirmed in [place]. Faxed

## 2018-01-30 ENCOUNTER — Inpatient Hospital Stay
Admission: RE | Admit: 2018-01-30 | Discharge: 2018-01-30 | Disposition: A | Discharge status: Home / Self Care | Payer: PRIVATE HEALTH INSURANCE | Source: Ambulatory Visit | Attending: Internal Medicine | Admitting: Internal Medicine

## 2018-01-31 ENCOUNTER — Telehealth: Payer: Self-pay | Admitting: Internal Medicine

## 2018-01-31 NOTE — Telephone Encounter (Signed)
I called the pt to schedule AWV w/ Clarise Cruz.  There was no answer and no option to leave a message.  Will try again later. VDM (DD)

## 2018-02-09 NOTE — Telephone Encounter (Signed)
Left msg asking pt to confirm this AWV-S w/ nurse after labs on 03/21/18. Last AWV 01/24/17. VDM (DD)

## 2018-02-15 ENCOUNTER — Other Ambulatory Visit: Payer: Self-pay | Admitting: Cardiology

## 2018-02-15 ENCOUNTER — Other Ambulatory Visit: Payer: Self-pay | Admitting: Internal Medicine

## 2018-02-15 DIAGNOSIS — I872 Venous insufficiency (chronic) (peripheral): Secondary | ICD-10-CM

## 2018-02-18 ENCOUNTER — Other Ambulatory Visit: Payer: Self-pay | Admitting: Internal Medicine

## 2018-02-27 NOTE — Telephone Encounter (Signed)
I called the pt to ask if he had received my message about completing AWV after labs on 03/21/18.  There was no answer and no option to leave a message. VDM (DD)

## 2018-03-07 ENCOUNTER — Ambulatory Visit
Admission: RE | Admit: 2018-03-07 | Discharge: 2018-03-07 | Disposition: A | Discharge status: Home / Self Care | Payer: PRIVATE HEALTH INSURANCE | Source: Ambulatory Visit | Attending: Cardiology | Admitting: Cardiology

## 2018-03-07 DIAGNOSIS — I872 Venous insufficiency (chronic) (peripheral): Secondary | ICD-10-CM

## 2018-03-07 NOTE — Consult Note (Signed)
Chief Complaint: Patient was seen in consultation today for  Chief Complaint  Patient presents with  . Consult    Consult for Bilateral Venous Insufficiency   at the request of Ganji,Jay  Referring Physician(s): Ganji,Jay  History of Present Illness: [name] is a 72 y.o. male who presents for reevaluation of lower extremity venous of insufficiency.  He has a history of bilateral lower extremity saphenous venous insufficiency. Left  great saphenous vein ablation 03/26/2013 Right small saphenous vein evaluate ablation 04/23/2013 Right GSV harvesting for CABG 09/22/2015  Since heart surgery, he has had right leg swelling.  This has responded to compression hose which he uses intermittently.  More recently he developed right lower leg pain and was diagnosed with cellulitis.  This is improving with treatment.   Past Medical History:  Diagnosis Date  . Anemia    NOS  . Anxiety   . 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 BPH (benign prostatic hypertrophy)   . Broken foot Oct. 12, 2013   Right foot Fx  . CAD (coronary artery disease)   . Chronic hyponatremia   . Chronic pain syndrome   . COPD (chronic obstructive pulmonary disease) ([place])   . Depression   . Diabetes mellitus   . Diabetic foot ulcer ([place])   . DJD (degenerative joint disease)    L wrist  . DM2 (diabetes mellitus, type 2) ([place])   . GERD (gastroesophageal reflux disease)   . HLD (hyperlipidemia)   . HTN (hypertension)   . Lumbar disc disease   . Osteoporosis    tx per [facility], Dr Prudencio Burly, thought due to heavy steriod use after 1978  . Spine fracture    hx, multiple  . Varicose veins     Past Surgical History:  Procedure Laterality Date  . CARDIAC CATHETERIZATION N/A 09/19/2015   Procedure: Left Heart Cath and Coronary Angiography;  Surgeon: Adrian Prows, MD;  Location: [facility];  Service: Cardiovascular;  Laterality: N/A;  . CARPAL TUNNEL RELEASE    . CORONARY ARTERY BYPASS GRAFT  N/A 09/22/2015   Procedure: CORONARY ARTERY BYPASS GRAFTING (CABG) times four using the right greater saphenous vein harvested endoscopically and the left internal mammary artery.  LIMA-LAD, SEQ SVG-DIAG & OM, SVG-PD.;  Surgeon: [name], MD;  Location: [facility];  Service: Open Heart Surgery;  Laterality: N/A;  . EYE SURGERY    . L foot open repair jones fracture  2010    5th metetarsal   . left ankle ganglion cyst  1976  . left cataract  1996   right - 1997  . left CTS  2006   R CTS - 2007  . left foot surgery  1998   R surgery/fracture - 1999  . left wrist/hand fusion  2008  . [place], 2004  . NASAL SINUS SURGERY     multiple- x8. last 1997 with obliteration  . REPAIR THORACIC AORTA  2017  . right hand fracture  1969  . ROTATOR CUFF REPAIR  2006   R, than repeat 2011, Dr Theda Sers  . TRIGGER FINGER RELEASE Left 11/11/2014   Procedure: LEFT LONG FINGER RELEASE TRIGGER FINGER/A-1 PULLEY;  Surgeon: Milly Jakob, MD;  Location: [facility];  Service: Orthopedics;  Laterality: Left;  . vocal surgery  1996    Allergies: Ivp dye [iodinated diagnostic agents]; Penicillins; Sulfa antibiotics; Sulfonamide derivatives; Lisinopril; Statins; and Propoxyphene  n-acetaminophen  Medications: Prior to Admission medications   Medication Sig Start Date End Date Taking? Authorizing Provider  ACCU-CHEK FASTCLIX LANCETS MISC Use as directed to check blood glucose. Dx: E11.51 01/17/18  Yes [name], NP  Blood Glucose Monitoring Suppl (ACCU-CHEK AVIVA PLUS) w/Device KIT Use as directed to check blood glucose daily. Dx: E11.51 01/17/18  Yes [name], NP  buPROPion (WELLBUTRIN XL) 150 MG 24 hr tablet Take 150 mg by mouth daily. Along with 300 mg to equal 450 mg daily   Yes [provider]  buPROPion (WELLBUTRIN XL) 300 MG 24 hr tablet Take 300 mg by mouth every morning.  10/12/11  Yes [provider]  celecoxib (CELEBREX) 200 MG capsule  Take 1 capsule (200 mg total) by mouth 2 (two) times daily. 01/25/18  Yes [name], DO  Cholecalciferol (VITAMIN D) 1000 UNITS capsule Take 5,000 Units by mouth daily.    Yes [provider]  clindamycin (CLEOCIN) 300 MG capsule Take 300 mg by mouth 3 (three) times daily.   Yes [provider]  clonazePAM (KLONOPIN) 0.5 MG tablet Take 1 mg by mouth daily.   Yes [provider]  clopidogrel (PLAVIX) 75 MG tablet Take 75 mg by mouth daily.   Yes [provider]  cyclobenzaprine (FLEXERIL) 10 MG tablet TK 1 T PO TID PRN 05/05/17  Yes [provider]  desonide (DESOWEN) 0.05 % cream Apply 1 application topically daily as needed ([place]).   Yes [provider]  fentaNYL (DURAGESIC - DOSED MCG/HR) 50 MCG/HR Place 50 mcg onto the skin every 3 (three) days.   Yes [provider]  furosemide (LASIX) 20 MG tablet Take 20 mg by mouth 2 (two) times daily.   Yes [provider]  gabapentin (NEURONTIN) 400 MG capsule Take 800 mg by mouth at bedtime.    Yes [provider]  glucose blood (ACCU-CHEK AVIVA PLUS) test strip 1 each by Other route as needed for other. Use as instructed. Dx: E11.51 01/17/18  Yes [name], NP  HYDROmorphone (DILAUDID) 2 MG tablet Take by mouth every 6 (six) hours as needed for severe pain.   Yes [provider]  JANUVIA 100 MG tablet TAKE 1 TABLET BY MOUTH EVERY DAY 02/20/18  Yes Eulas Post, Monica, DO  losartan (COZAAR) 25 MG tablet Take 25 mg by mouth daily.   Yes [provider]  omeprazole (PRILOSEC) 40 MG capsule Take 1 capsule (40 mg total) by mouth daily. 12/21/17  Yes Eulas Post, Monica, DO  potassium chloride (K-DUR) 10 MEQ tablet Take 10 mEq by mouth 2 (two) times daily.   Yes [provider]  PROAIR HFA 108 (90 BASE) MCG/ACT inhaler Inhale 2 puffs into the lungs every 6 (six) hours as needed.  03/12/14  Yes [provider]  tamsulosin (FLOMAX) 0.4 MG CAPS capsule  Take 1 capsule (0.4 mg total) by mouth 2 (two) times daily. 12/21/17  Yes Eulas Post, Monica, DO  zonisamide (ZONEGRAN) 50 MG capsule TAKE 3 CAPSULES(150 MG) BY MOUTH AT BEDTIME 02/15/18  Yes [name], DO  Multiple Vitamins-Iron (MULTIVITAMINS WITH IRON) TABS tablet Take 1 tablet by mouth daily.    [provider]  nitroGLYCERIN (NITROSTAT) 0.4 MG SL tablet Place 0.4 mg under the tongue every 5 (five) minutes as needed for chest pain.    [provider]  rOPINIRole (REQUIP) 1 MG tablet Take 1-2 mg by mouth at bedtime.    [provider]  VITAMIN E PO  Take 8,000 Units by mouth daily.    [provider]     Family History  Problem Relation Age of Onset  . Lung cancer Father 67  . Diabetes Mother   . Osteoporosis Mother   . Throat cancer Unknown        family hx - "bad living" - also lung CA, heart disease and renal failure    . Diabetes Sister   . Suicidality Son 46  . Lung cancer Maternal Grandmother   . Diabetes Maternal Grandfather   . Heart attack Paternal Grandmother     Social History   Socioeconomic History  . Marital status: Divorced    Spouse name: Not on file  . Number of children: Not on file  . Years of education: Not on file  . Highest education level: Not on file  Occupational History  . Not on file  Social Needs  . Financial resource strain: Not on file  . Food insecurity:    Worry: Not on file    Inability: Not on file  . Transportation needs:    Medical: Not on file    Non-medical: Not on file  Tobacco Use  . Smoking status: Former Smoker    Packs/day: 1.50    Years: 30.00    Pack years: 45.00    Types: Cigarettes    Last attempt to quit: 11/06/1988    Years since quitting: 29.3  . Smokeless tobacco: Never Used  Substance and Sexual Activity  . Alcohol use: Not Currently    Comment: None in 30 years but drank beer before  . Drug use: No  . Sexual activity: Never  Lifestyle  . Physical activity:    Days per week:  Not on file    Minutes per session: Not on file  . Stress: Not on file  Relationships  . Social connections:    Talks on phone: Not on file    Gets together: Not on file    Attends religious service: Not on file    Active member of club or organization: Not on file    Attends meetings of clubs or organizations: Not on file    Relationship status: Not on file  Other Topics Concern  . Not on file  Social History Narrative   Social History      Diet? I eat about what I want.       Do you drink/eat things with caffeine? yes      Marital status?                  divorced                  What year were you married? 1966 to 2007      Do you live in a house, apartment, assisted living, condo, trailer, etc.? [place]      Is it one or more stories? one      How many persons live in your home? Me only      Do you have any pets in your home? (please list) no      Highest level of education completed? Finished high school      Current or past profession: Clinical biochemist in [place] you exercise?               no  Type & how often?      Advanced Directives      Do you have a living will? no      Do you have a DNR form?     no                             If not, do you want to discuss one?      Do you have signed POA/HPOA for forms? No      Functional Status      Do you have difficulty bathing or dressing yourself? No- shower      Do you have difficulty preparing food or eating? No- Don't want to      Do you have difficulty managing your medications?      Do you have difficulty managing your finances?      Do you have difficulty affording your medications? No- paid for by [employer]    ECOG Status: 1 - Symptomatic but completely ambulatory  Review of Systems: A 12 point ROS discussed and pertinent positives are indicated in the HPI above.  All other systems are negative.  Physical Exam  Vital Signs: BP (!) 112/52   Pulse 73    Temp 97.8 F (36.6 C) (Oral)   Resp 16   Ht 6' (1.829 m)   Wt 215 lb (97.5 kg)   SpO2 95%   BMI 29.16 kg/m   Constitutional: Oriented to person, place, and time. Well-developed and well-nourished. No distress.  HENT:  Head: Normocephalic and atraumatic.  Eyes: Conjunctivae and EOM are normal. Right eye exhibits no discharge. Left eye exhibits no discharge. No scleral icterus.  Neck: No JVD present.  Pulmonary/Chest: Effort normal. No stridor. No respiratory distress.  Abdomen: soft, non distended Neurological:  alert and oriented to person, place, and time.  Skin: Skin is warm and dry.  not diaphoretic.  Visible enlarged varicose veins along the medial aspect of the right knee extending to the anterior calf.  Scattered varicose veins in the left lower extremity. Psychiatric:   normal mood and affect.   behavior is normal. Judgment and thought content normal.   Review of Systems   Mallampati Score:     Imaging: US Venous Img Lower Bilateral  Result Date: 03/07/2018 CLINICAL DATA:  Right lower leg swelling x2 years post vein harvest for CABG. Previous bilateral lower leg laser surgery. Recent right lower leg cellulitis. Left leg is asymptomatic. EXAM: BILATERAL LOWER EXTREMITY VENOUS DOPPLER ULTRASOUND TECHNIQUE: Gray-scale sonography with graded compression, as well as color Doppler and duplex ultrasound, were performed to evaluate the deep and superficial veins of both lower extremities. Spectral Doppler was utilized to evaluate flow at rest and with distal augmentation maneuvers. A complete right superficial venous insufficiency exam was performed in the upright standing position. I personally performed the technical portion of the exam. COMPARISON:  None. FINDINGS: Deep Venous System: Evaluation of the deep venous system including the common femoral, femoral, profunda femoral, popliteal and calf veins (where visible) demonstrate no evidence of deep venous thrombosis. The vessels are  compressible and demonstrate normal respiratory phasicity and response to augmentation. No evidence of the deep venous reflux. Superficial Venous System: RIGHT SFJ: Patent.  No reflux post augmentation. GSV Prox Thigh: Normal in caliber, patent, no reflux post augmentation. GSV Mid Thigh: Not visualized GSV Lower Thigh: Not visualized GSV at Knee: Not visualized GSV Prox Calf: Not visualized GSV Mid Calf: Not  visualized GSV Distal Calf: Not visualized SPJ: Patent with greater than 6 seconds reflux post augmentation SSV Prox: Patent without convincing reflux post augmentation. Multiple branches. SSV Mid: Not visualized SSV Distal: Not visualized Other: Multiple dilated collateral venous channels from the short saphenous vein along the medial and anterior aspect of the knee and proximal calf. IMPRESSION: 1. Normal bilateral lower extremity deep venous systems. No evidence of acute or chronic DVT. 2. Nonvisualization of most of the right great saphenous vein consistent with surgical history of vein harvest. 3. Significant valvular incompetence and reflux in the proximal right short saphenous vein with communication to multiple superficial venous varicosities extending across the knee into the calf. Electronically Signed   By: Lucrezia Europe M.D.   On: 03/07/2018 10:19   [place] [facility]  Result Date: 03/07/2018 Please refer to "Notes" to see consult details.   Labs:  CBC: Recent Labs    12/01/17  WBC 5.3  HGB 13.5  HCT 41  PLT 191    COAGS: No results for input(s): INR, APTT in the last 8760 hours.  BMP: Recent Labs    12/01/17  NA 132*  K 4.7  BUN 13  CREATININE 0.7    LIVER FUNCTION TESTS: Recent Labs    12/01/17  AST 23  ALT 15  ALKPHOS 70    TUMOR MARKERS: No results for input(s): AFPTM, CEA, CA199, CHROMGRNA in the last 8760 hours.  Assessment and Plan:  My impression is that the patient has developed recurrent symptomatic varicose veins from the residual patent segment  of the right short saphenous vein near the saphenopopliteal junction.  These likely account for his right leg swelling, which has recently been complicated by superimposed cellulitis.  The residual segment is too small for catheter directed ablation.  The major associated varicose veins are however approachable for percutaneous ultrasound-guided sclerotherapy.  Given his symptoms of swelling, complicated by this first episode of cellulitis, I think intervention is appropriate and indicated. We reviewed the pathophysiology of saphenous venous valvular incompetence and insufficiency.  We reviewed the pathophysiology of recurrent symptoms.  We reviewed treatment options including watchful waiting, compression hose, and injection sclerotherapy with ultrasound guidance.  We reviewed the sclerotherapy technique, anticipated benefits, possible side effects and complications, and time course of symptom resolution.  We reviewed the possibility of additional symptomatic varicose veins developing elsewhere in the future.  We discussed the importance of using compression hose in order to possibly decrease risk of future recurrence.  He seemed to understand and is motivated to proceed.  He would like to let the cellulitis heal up before treatment, as he will need to use his compression hose immediately post treatment, and it is uncomfortable in the setting of cellulitis.  I think this is reasonable. Accordingly, we will follow-up with him in 2 or 3 weeks to see how the cellulitis is improving.  Once he feels like this has resolved, we can set him up for outpatient ultrasound-guided foam injection sclerotherapy of recurrent symptomatic right lower extremity varicose veins.  Thank you for this interesting consult.  I greatly enjoyed meeting [name] and look forward to participating in their care.  A copy of this report was sent to the requesting provider on this date.  Electronically Signed: Rickard Rhymes 03/07/2018, 10:35 AM   I spent a total of  40 Minutes   in face to face in clinical consultation, greater than 50% of which was counseling/coordinating care for symptomatic right lower  extremity varicose veins.

## 2018-03-16 DIAGNOSIS — L03119 Cellulitis of unspecified part of limb: Secondary | ICD-10-CM

## 2018-03-16 DIAGNOSIS — L02419 Cutaneous abscess of limb, unspecified: Secondary | ICD-10-CM

## 2018-03-16 HISTORY — DX: Cellulitis of unspecified part of limb: L03.119

## 2018-03-16 HISTORY — DX: Cutaneous abscess of limb, unspecified: L02.419

## 2018-03-20 ENCOUNTER — Other Ambulatory Visit: Payer: Self-pay | Admitting: Internal Medicine

## 2018-03-21 ENCOUNTER — Other Ambulatory Visit: Payer: Medicare Other

## 2018-03-21 ENCOUNTER — Telehealth: Payer: Self-pay | Admitting: Surgery

## 2018-03-21 ENCOUNTER — Ambulatory Visit (INDEPENDENT_AMBULATORY_CARE_PROVIDER_SITE_OTHER): Payer: PRIVATE HEALTH INSURANCE

## 2018-03-21 ENCOUNTER — Telehealth: Payer: Self-pay

## 2018-03-21 VITALS — BP 142/48 | HR 85 | Temp 98.2°F | Ht 72.0 in | Wt 222.0 lb

## 2018-03-21 DIAGNOSIS — Z723 Lack of physical exercise: Secondary | ICD-10-CM

## 2018-03-21 DIAGNOSIS — Z135 Encounter for screening for eye and ear disorders: Secondary | ICD-10-CM

## 2018-03-21 DIAGNOSIS — E782 Mixed hyperlipidemia: Secondary | ICD-10-CM

## 2018-03-21 DIAGNOSIS — Z0000 Encounter for general adult medical examination without abnormal findings: Secondary | ICD-10-CM

## 2018-03-21 DIAGNOSIS — E1151 Type 2 diabetes mellitus with diabetic peripheral angiopathy without gangrene: Secondary | ICD-10-CM | POA: Diagnosis not present

## 2018-03-21 NOTE — Telephone Encounter (Signed)
Patient told me during medicare visit that he thought his last colonoscopy was completed over 5 years ago at [facility]. I called them and they said they don't have a record of him going back to 2005. I called and left a voicemail with [name] to explain it all to him.

## 2018-03-21 NOTE — Progress Notes (Signed)
Subjective:   [name] is a 72 y.o. male who presents for Medicare Annual/Subsequent preventive examination.  Last AWV-01/24/2017       Objective:    Vitals: BP (!) 142/48 (BP Location: Left Arm, Patient Position: Sitting)   Pulse 85   Temp 98.2 F (36.8 C) (Oral)   Ht 6' (1.829 m)   Wt 222 lb (100.7 kg)   SpO2 97%   BMI 30.11 kg/m   Body mass index is 30.11 kg/m.  Advanced Directives 03/21/2018 01/17/2018 05/09/2017 01/17/2017 12/11/2016 12/16/2015 11/06/2015  Does Patient Have a Medical Advance Directive? No No No No No No No  Would patient like information on creating a medical advance directive? Yes (MAU/Ambulatory/Procedural Areas - Information given) - - - - No - patient declined information -    Tobacco Social History   Tobacco Use  Smoking Status Former Smoker  . Packs/day: 1.50  . Years: 30.00  . Pack years: 45.00  . Types: Cigarettes  . Last attempt to quit: 11/06/1988  . Years since quitting: 29.3  Smokeless Tobacco Never Used     Counseling given: Not Answered   Clinical Intake:  Pre-visit preparation completed: No        Nutritional Risks: None Diabetes: Yes CBG done?: No Did pt. bring in CBG monitor from home?: No  How often do you need to have someone help you when you read instructions, pamphlets, or other written materials from your doctor or pharmacy?: 1 - Never What is the last grade level you completed in school?: High school  Interpreter Needed?: No  Information entered by :: Tyson Dense, RN  Past Medical History:  Diagnosis Date  . Anemia    NOS  . Anxiety   . 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 BPH (benign prostatic hypertrophy)   . Broken foot Oct. 12, 2013   Right foot Fx  . CAD (coronary artery disease)   . Chronic hyponatremia   . Chronic pain syndrome   . COPD (chronic obstructive pulmonary disease) ([place])   . Depression   . Diabetes mellitus   . Diabetic foot ulcer ([place])   . DJD (degenerative  joint disease)    L wrist  . DM2 (diabetes mellitus, type 2) ([place])   . GERD (gastroesophageal reflux disease)   . HLD (hyperlipidemia)   . HTN (hypertension)   . Lumbar disc disease   . Osteoporosis    tx per [facility], Dr Prudencio Burly, thought due to heavy steriod use after 1978  . Spine fracture    hx, multiple  . Varicose veins    Past Surgical History:  Procedure Laterality Date  . CARDIAC CATHETERIZATION N/A 09/19/2015   Procedure: Left Heart Cath and Coronary Angiography;  Surgeon: Adrian Prows, MD;  Location: [facility];  Service: Cardiovascular;  Laterality: N/A;  . CARPAL TUNNEL RELEASE    . CORONARY ARTERY BYPASS GRAFT N/A 09/22/2015   Procedure: CORONARY ARTERY BYPASS GRAFTING (CABG) times four using the right greater saphenous vein harvested endoscopically and the left internal mammary artery.  LIMA-LAD, SEQ SVG-DIAG & OM, SVG-PD.;  Surgeon: [name], MD;  Location: [facility];  Service: Open Heart Surgery;  Laterality: N/A;  . EYE SURGERY    . L foot open repair jones fracture  2010    5th metetarsal   . left ankle ganglion cyst  1976  . left cataract  1996   right - 1997  . left CTS  2006   R CTS - 2007  . left foot surgery  1998   R surgery/fracture - 1999  . left wrist/hand fusion  2008  . [place], 2004  . NASAL SINUS SURGERY     multiple- x8. last 1997 with obliteration  . REPAIR THORACIC AORTA  2017  . right hand fracture  1969  . ROTATOR CUFF REPAIR  2006   R, than repeat 2011, Dr Theda Sers  . TRIGGER FINGER RELEASE Left 11/11/2014   Procedure: LEFT LONG FINGER RELEASE TRIGGER FINGER/A-1 PULLEY;  Surgeon: Milly Jakob, MD;  Location: [facility];  Service: Orthopedics;  Laterality: Left;  . vocal surgery  1996   Family History  Problem Relation Age of Onset  . Lung cancer Father 33  . Diabetes Mother   . Osteoporosis Mother   . Throat cancer Unknown        family hx - "bad living" - also lung CA, heart disease and renal  failure    . Diabetes Sister   . Suicidality Son 40  . Lung cancer Maternal Grandmother   . Diabetes Maternal Grandfather   . Heart attack Paternal Grandmother    Social History   Socioeconomic History  . Marital status: Divorced    Spouse name: Not on file  . Number of children: Not on file  . Years of education: Not on file  . Highest education level: Not on file  Occupational History  . Not on file  Social Needs  . Financial resource strain: Not hard at all  . Food insecurity:    Worry: Never true    Inability: Never true  . Transportation needs:    Medical: No    Non-medical: No  Tobacco Use  . Smoking status: Former Smoker    Packs/day: 1.50    Years: 30.00    Pack years: 45.00    Types: Cigarettes    Last attempt to quit: 11/06/1988    Years since quitting: 29.3  . Smokeless tobacco: Never Used  Substance and Sexual Activity  . Alcohol use: Not Currently    Comment: None in 30 years but drank beer before  . Drug use: No  . Sexual activity: Never  Lifestyle  . Physical activity:    Days per week: 0 days    Minutes per session: 0 min  . Stress: Rather much  Relationships  . Social connections:    Talks on phone: Never    Gets together: More than three times a week    Attends religious service: Never    Active member of club or organization: No    Attends meetings of clubs or organizations: Never    Relationship status: Divorced  Other Topics Concern  . Not on file  Social History Narrative   Social History      Diet? I eat about what I want.       Do you drink/eat things with caffeine? yes      Marital status?                  divorced                  What year were you married? 1966 to 2007      Do you live in a house, apartment, assisted living, condo, trailer, etc.? [place]      Is it one or more stories? one      How many persons live in  your home? Me only      Do you have any pets in your home? (please list) no      Highest level of  education completed? Finished high school      Current or past profession: Clinical biochemist in [place] you exercise?               no                       Type & how often?      Advanced Directives      Do you have a living will? no      Do you have a DNR form?     no                             If not, do you want to discuss one?      Do you have signed POA/HPOA for forms? No      Functional Status      Do you have difficulty bathing or dressing yourself? No- shower      Do you have difficulty preparing food or eating? No- Don't want to      Do you have difficulty managing your medications?      Do you have difficulty managing your finances?      Do you have difficulty affording your medications? No- paid for by [employer]    Outpatient Encounter Medications as of 03/21/2018  Medication Sig  . ACCU-CHEK FASTCLIX LANCETS MISC Use as directed to check blood glucose. Dx: E11.51  . Blood Glucose Monitoring Suppl (ACCU-CHEK AVIVA PLUS) w/Device KIT Use as directed to check blood glucose daily. Dx: E11.51  . buPROPion (WELLBUTRIN XL) 150 MG 24 hr tablet Take 150 mg by mouth daily. Along with 300 mg to equal 450 mg daily  . buPROPion (WELLBUTRIN XL) 300 MG 24 hr tablet Take 300 mg by mouth every morning.   . celecoxib (CELEBREX) 200 MG capsule TAKE 1 CAPSULE(200 MG) BY MOUTH TWICE DAILY  . Cholecalciferol (VITAMIN D) 1000 UNITS capsule Take 5,000 Units by mouth daily.   . clonazePAM (KLONOPIN) 0.5 MG tablet Take 1 mg by mouth daily.  . clopidogrel (PLAVIX) 75 MG tablet Take 75 mg by mouth daily.  . cyclobenzaprine (FLEXERIL) 10 MG tablet TK 1 T PO TID PRN  . desonide (DESOWEN) 0.05 % cream Apply 1 application topically daily as needed ([place]).  . fentaNYL (DURAGESIC - DOSED MCG/HR) 50 MCG/HR Place 50 mcg onto the skin every 3 (three) days.  . furosemide (LASIX) 20 MG tablet Take 20 mg by mouth 2 (two) times daily.  Marland Kitchen gabapentin (NEURONTIN) 400 MG capsule Take 800 mg by  mouth at bedtime.   Marland Kitchen glucose blood (ACCU-CHEK AVIVA PLUS) test strip 1 each by Other route as needed for other. Use as instructed. Dx: E11.51  . HYDROmorphone (DILAUDID) 2 MG tablet Take by mouth every 6 (six) hours as needed for severe pain.  Marland Kitchen JANUVIA 100 MG tablet TAKE 1 TABLET BY MOUTH EVERY DAY  . losartan (COZAAR) 25 MG tablet Take 25 mg by mouth daily.  . nitroGLYCERIN (NITROSTAT) 0.4 MG SL tablet Place 0.4 mg under the tongue every 5 (five) minutes as needed for chest pain.  Marland Kitchen omeprazole (PRILOSEC) 40 MG capsule Take 1 capsule (40 mg total) by mouth daily.  Marland Kitchen omeprazole (PRILOSEC) 40  MG capsule Take 80 mg by mouth daily.  . potassium chloride (K-DUR) 10 MEQ tablet Take 10 mEq by mouth 2 (two) times daily.  Marland Kitchen PROAIR HFA 108 (90 BASE) MCG/ACT inhaler Inhale 2 puffs into the lungs every 6 (six) hours as needed.   Marland Kitchen rOPINIRole (REQUIP) 1 MG tablet Take 1-2 mg by mouth at bedtime.  . tamsulosin (FLOMAX) 0.4 MG CAPS capsule Take 1 capsule (0.4 mg total) by mouth 2 (two) times daily.  Marland Kitchen VITAMIN E PO Take 8,000 Units by mouth daily.  Marland Kitchen zonisamide (ZONEGRAN) 50 MG capsule TAKE 3 CAPSULES(150 MG) BY MOUTH AT BEDTIME  . clindamycin (CLEOCIN) 300 MG capsule Take 300 mg by mouth 3 (three) times daily.  . Multiple Vitamins-Iron (MULTIVITAMINS WITH IRON) TABS tablet Take 1 tablet by mouth daily.   No facility-administered encounter medications on file as of 03/21/2018.     Activities of Daily Living In your present state of health, do you have any difficulty performing the following activities: 03/21/2018  Hearing? N  Vision? N  Difficulty concentrating or making decisions? N  Walking or climbing stairs? N  Dressing or bathing? N  Doing errands, shopping? N  Preparing Food and eating ? N  Using the Toilet? N  In the past six months, have you accidently leaked urine? N  Do you have problems with loss of bowel control? N  Managing your Medications? N  Managing your Finances? N  Housekeeping or  managing your Housekeeping? N  Some recent data might be hidden    Patient Care Team: [name], DO as PCP - General (Internal Medicine) Serafina Mitchell, MD as Consulting Physician (Vascular Surgery) Adrian Prows, MD as Consulting Physician (Cardiology) Jana Half, DPM as Consulting Physician (Podiatry)   Assessment:   This is a routine wellness examination for [name].  Exercise Activities and Dietary recommendations Current Exercise Habits: The patient does not participate in regular exercise at present, Exercise limited by: None identified  Goals    None      Fall Risk Fall Risk  03/21/2018 01/17/2018 12/21/2017 12/16/2015 11/20/2014  Falls in the past year? Yes Yes Yes Yes Yes  Number falls in past yr: 2 or more 1 2 or more 1 1  Injury with Fall? No - No No No  Risk for fall due to : - - - History of fall(s);Impaired balance/gait;Medication side effect -   Is the patient's home free of loose throw rugs in walkways, pet beds, electrical cords, etc?   yes      Grab bars in the bathroom? yes      Handrails on the stairs?   yes      Adequate lighting?   yes  Timed Get Up and Go Performed: 24 seconds  Depression Screen PHQ 2/9 Scores 03/21/2018 12/16/2015 11/20/2014  PHQ - 2 Score 0 1 0    Cognitive Function MMSE - Mini Mental State Exam 03/21/2018  Orientation to time 4  Orientation to Place 5  Registration 3  Attention/ Calculation 5  Recall 1  Language- name 2 objects 2  Language- repeat 1  Language- follow 3 step command 3  Language- read & follow direction 1  Write a sentence 1  Copy design 0  Total score 26        Immunization History  Administered Date(s) Administered  . Influenza Whole 06/16/2009  . Influenza, High Dose Seasonal PF 05/20/2017  . Pneumococcal Conjugate-13 09/14/2013  . Pneumococcal Polysaccharide-23 08/17/2007, 08/16/2014  . Td  10/17/2009    Qualifies for Shingles Vaccine? Due, patient wants to check cost at pharmacy first  Screening  Tests Health Maintenance  Topic Date Due  . Hepatitis C Screening  Oct 29, 1945  . OPHTHALMOLOGY EXAM  04/08/1956  . COLONOSCOPY  04/08/1996  . FOOT EXAM  01/24/2018  . INFLUENZA VACCINE  03/16/2018  . HEMOGLOBIN A1C  06/02/2018  . TETANUS/TDAP  10/18/2019  . PNA vac Low Risk Adult  Completed   Cancer Screenings: Lung: Low Dose CT Chest recommended if Age 14-80 years, 30 pack-year currently smoking OR have quit w/in 15years. Patient does qualify. Colorectal: possibly due. Records release form signed. Patient said he had it [facility] at [facility] over 5 years ago  Additional Screenings:  Hepatitis C Screening:declined  Diabetic eye exam due-referral sent    Plan:    I have personally reviewed and addressed the Medicare Annual Wellness questionnaire and have noted the following in the patient's chart:  A. Medical and social history B. Use of alcohol, tobacco or illicit drugs  C. Current medications and supplements D. Functional ability and status E.  Nutritional status F.  Physical activity G. Advance directives H. List of other physicians I.  Hospitalizations, surgeries, and ER visits in previous 12 months J.  [place] to include hearing, vision, cognitive, depression L. Referrals and appointments - [facility] and eye doctor  In addition, I have reviewed and discussed with patient certain preventive protocols, quality metrics, and best practice recommendations. A written personalized care plan for preventive services as well as general preventive health recommendations were provided to patient.  See attached scanned questionnaire for additional information.   Signed,   Tyson Dense, RN Nurse Health Advisor  Patient Concerns: Foot has been painful. He goes to [facility]

## 2018-03-21 NOTE — Patient Instructions (Addendum)
[name] , Thank you for taking time to come for your Medicare Wellness Visit. I appreciate your ongoing commitment to your health goals. Please review the following plan we discussed and let me know if I can assist you in the future.   Screening recommendations/referrals: Colonoscopy- records release form filled out Recommended yearly ophthalmology/optometry visit for glaucoma screening and checkup- eye referral sent for Dr. Michel Santee Recommended yearly dental visit for hygiene and checkup  Vaccinations: Influenza vaccine up to date, due 2019  Pneumococcal vaccine up to date, completed Tdap vaccine up to date, due 10/18/2019 Shingles vaccine due, call pharmacy to see the cost of the shot and if you want it they can give it to you    Advanced directives: Advance directive discussed with you today. I have provided a copy for you to complete at home and have notarized. Once this is complete please bring a copy in to our office so we can scan it into your chart.  Conditions/risks identified: [name] will call you about resources for making a will and silver sneakers at the [facility]  Next appointment: Dr. Eulas Post 03/28/2018 @ 2:15pm            Tyson Dense, RN 03/23/2019 @ 8:30am  Preventive Care 65 Years and Older, Male Preventive care refers to lifestyle choices and visits with your health care provider that can promote health and wellness. What does preventive care include?  A yearly physical exam. This is also called an annual well check.  Dental exams once or twice a year.  Routine eye exams. Ask your health care provider how often you should have your eyes checked.  Personal lifestyle choices, including:  Daily care of your teeth and gums.  Regular physical activity.  Eating a healthy diet.  Avoiding tobacco and drug use.  Limiting alcohol use.  Practicing safe sex.  Taking low doses of aspirin every day.  Taking vitamin and mineral supplements as recommended by your health  care provider. What happens during an annual well check? The services and screenings done by your health care provider during your annual well check will depend on your age, overall health, lifestyle risk factors, and family history of disease. Counseling  Your health care provider may ask you questions about your:  Alcohol use.  Tobacco use.  Drug use.  Emotional well-being.  Home and relationship well-being.  Sexual activity.  Eating habits.  History of falls.  Memory and ability to understand (cognition).  Work and work Statistician. Screening  You may have the following tests or measurements:  Height, weight, and BMI.  Blood pressure.  Lipid and cholesterol levels. These may be checked every 5 years, or more frequently if you are over 55 years old.  Skin check.  Lung cancer screening. You may have this screening every year starting at age 40 if you have a 30-pack-year history of smoking and currently smoke or have quit within the past 15 years.  Fecal occult blood test (FOBT) of the stool. You may have this test every year starting at age 59.  Flexible sigmoidoscopy or colonoscopy. You may have a sigmoidoscopy every 5 years or a colonoscopy every 10 years starting at age 54.  Prostate cancer screening. Recommendations will vary depending on your family history and other risks.  Hepatitis C blood test.  Hepatitis B blood test.  Sexually transmitted disease (STD) testing.  Diabetes screening. This is done by checking your blood sugar (glucose) after you have not eaten for a while (fasting). You  may have this done every 1-3 years.  Abdominal aortic aneurysm (AAA) screening. You may need this if you are a current or former smoker.  Osteoporosis. You may be screened starting at age 76 if you are at high risk. Talk with your health care provider about your test results, treatment options, and if necessary, the need for more tests. Vaccines  Your health care  provider may recommend certain vaccines, such as:  Influenza vaccine. This is recommended every year.  Tetanus, diphtheria, and acellular pertussis (Tdap, Td) vaccine. You may need a Td booster every 10 years.  Zoster vaccine. You may need this after age 66.  Pneumococcal 13-valent conjugate (PCV13) vaccine. One dose is recommended after age 54.  Pneumococcal polysaccharide (PPSV23) vaccine. One dose is recommended after age 42. Talk to your health care provider about which screenings and vaccines you need and how often you need them. This information is not intended to replace advice given to you by your health care provider. Make sure you discuss any questions you have with your health care provider. Document Released: 08/29/2015 Document Revised: 04/21/2016 Document Reviewed: 06/03/2015 Elsevier Interactive Patient Education  2017 [place] Prevention in the Home Falls can cause injuries. They can happen to people of all ages. There are many things you can do to make your home safe and to help prevent falls. What can I do on the outside of my home?  Regularly fix the edges of walkways and driveways and fix any cracks.  Remove anything that might make you trip as you walk through a door, such as a raised step or threshold.  Trim any bushes or trees on the path to your home.  Use bright outdoor lighting.  Clear any walking paths of anything that might make someone trip, such as rocks or tools.  Regularly check to see if handrails are loose or broken. Make sure that both sides of any steps have handrails.  Any raised decks and porches should have guardrails on the edges.  Have any leaves, snow, or ice cleared regularly.  Use sand or salt on walking paths during winter.  Clean up any spills in your garage right away. This includes oil or grease spills. What can I do in the bathroom?  Use night lights.  Install grab bars by the toilet and in the tub and shower. Do  not use towel bars as grab bars.  Use non-skid mats or decals in the tub or shower.  If you need to sit down in the shower, use a plastic, non-slip stool.  Keep the floor dry. Clean up any water that spills on the floor as soon as it happens.  Remove soap buildup in the tub or shower regularly.  Attach bath mats securely with double-sided non-slip rug tape.  Do not have throw rugs and other things on the floor that can make you trip. What can I do in the bedroom?  Use night lights.  Make sure that you have a light by your bed that is easy to reach.  Do not use any sheets or blankets that are too big for your bed. They should not hang down onto the floor.  Have a firm chair that has side arms. You can use this for support while you get dressed.  Do not have throw rugs and other things on the floor that can make you trip. What can I do in the kitchen?  Clean up any spills right away.  Avoid walking on  wet floors.  Keep items that you use a lot in easy-to-reach places.  If you need to reach something above you, use a strong step stool that has a grab bar.  Keep electrical cords out of the way.  Do not use floor polish or wax that makes floors slippery. If you must use wax, use non-skid floor wax.  Do not have throw rugs and other things on the floor that can make you trip. What can I do with my stairs?  Do not leave any items on the stairs.  Make sure that there are handrails on both sides of the stairs and use them. Fix handrails that are broken or loose. Make sure that handrails are as long as the stairways.  Check any carpeting to make sure that it is firmly attached to the stairs. Fix any carpet that is loose or worn.  Avoid having throw rugs at the top or bottom of the stairs. If you do have throw rugs, attach them to the floor with carpet tape.  Make sure that you have a light switch at the top of the stairs and the bottom of the stairs. If you do not have them,  ask someone to add them for you. What else can I do to help prevent falls?  Wear shoes that:  Do not have high heels.  Have rubber bottoms.  Are comfortable and fit you well.  Are closed at the toe. Do not wear sandals.  If you use a stepladder:  Make sure that it is fully opened. Do not climb a closed stepladder.  Make sure that both sides of the stepladder are locked into place.  Ask someone to hold it for you, if possible.  Clearly mark and make sure that you can see:  Any grab bars or handrails.  First and last steps.  Where the edge of each step is.  Use tools that help you move around (mobility aids) if they are needed. These include:  Canes.  Walkers.  Scooters.  Crutches.  Turn on the lights when you go into a dark area. Replace any light bulbs as soon as they burn out.  Set up your furniture so you have a clear path. Avoid moving your furniture around.  If any of your floors are uneven, fix them.  If there are any pets around you, be aware of where they are.  Review your medicines with your doctor. Some medicines can make you feel dizzy. This can increase your chance of falling. Ask your doctor what other things that you can do to help prevent falls. This information is not intended to replace advice given to you by your health care provider. Make sure you discuss any questions you have with your health care provider. Document Released: 05/29/2009 Document Revised: 01/08/2016 Document Reviewed: 09/06/2014 Elsevier Interactive Patient Education  2017 [employer].

## 2018-03-21 NOTE — Telephone Encounter (Signed)
Resched. Appt. Spoke with pt. To confrim 04/10/18 f/u P/A 10:45am

## 2018-03-22 LAB — COMPLETE METABOLIC PANEL WITH GFR
AG Ratio: 1.2 (calc) (ref 1.0–2.5)
ALBUMIN MSPROF: 3.8 g/dL (ref 3.6–5.1)
ALT: 11 U/L (ref 9–46)
AST: 18 U/L (ref 10–35)
Alkaline phosphatase (APISO): 89 U/L (ref 40–115)
BUN: 18 mg/dL (ref 7–25)
CALCIUM: 8.8 mg/dL (ref 8.6–10.3)
CHLORIDE: 94 mmol/L — AB (ref 98–110)
CO2: 28 mmol/L (ref 20–32)
CREATININE: 0.89 mg/dL (ref 0.70–1.18)
GFR, Est African American: 100 mL/min/{1.73_m2} (ref >=60)
GFR, Est Non African American: 86 mL/min/{1.73_m2} (ref >=60)
GLOBULIN: 3.3 g/dL (ref 1.9–3.7)
Glucose, Bld: 124 mg/dL — ABNORMAL HIGH (ref 65–99)
Potassium: 4.9 mmol/L (ref 3.5–5.3)
SODIUM: 129 mmol/L — AB (ref 135–146)
Total Bilirubin: 0.7 mg/dL (ref 0.2–1.2)
Total Protein: 7.1 g/dL (ref 6.1–8.1)

## 2018-03-22 LAB — LIPID PANEL
CHOL/HDL RATIO: 3.8 (calc) (ref <5.0)
CHOLESTEROL: 139 mg/dL (ref <200)
HDL: 37 mg/dL — ABNORMAL LOW (ref >40)
LDL CHOLESTEROL (CALC): 86 mg/dL
Non-HDL Cholesterol (Calc): 102 mg/dL (calc) (ref <130)
Triglycerides: 71 mg/dL (ref <150)

## 2018-03-22 LAB — HEMOGLOBIN A1C
EAG (MMOL/L): 7.9 (calc)
HEMOGLOBIN A1C: 6.6 %{Hb} — AB (ref <5.7)
MEAN PLASMA GLUCOSE: 143 (calc)

## 2018-03-22 LAB — MICROALBUMIN / CREATININE URINE RATIO
Creatinine, Urine: 122 mg/dL (ref 20–320)
Microalb Creat Ratio: 7 mcg/mg creat (ref <30)
Microalb, Ur: 0.9 mg/dL

## 2018-03-28 ENCOUNTER — Ambulatory Visit (INDEPENDENT_AMBULATORY_CARE_PROVIDER_SITE_OTHER): Payer: PRIVATE HEALTH INSURANCE | Admitting: Internal Medicine

## 2018-03-28 ENCOUNTER — Ambulatory Visit: Payer: PRIVATE HEALTH INSURANCE | Admitting: Internal Medicine

## 2018-03-28 ENCOUNTER — Other Ambulatory Visit (HOSPITAL_COMMUNITY): Payer: Self-pay | Admitting: Interventional Radiology

## 2018-03-28 ENCOUNTER — Encounter: Payer: Self-pay | Admitting: Internal Medicine

## 2018-03-28 ENCOUNTER — Encounter: Payer: Self-pay | Admitting: Radiology

## 2018-03-28 VITALS — BP 130/62 | HR 81 | Temp 97.7°F | Resp 16 | Ht 72.0 in | Wt 222.4 lb

## 2018-03-28 DIAGNOSIS — L729 Follicular cyst of the skin and subcutaneous tissue, unspecified: Secondary | ICD-10-CM

## 2018-03-28 DIAGNOSIS — E1151 Type 2 diabetes mellitus with diabetic peripheral angiopathy without gangrene: Secondary | ICD-10-CM | POA: Diagnosis not present

## 2018-03-28 DIAGNOSIS — L739 Follicular disorder, unspecified: Secondary | ICD-10-CM

## 2018-03-28 DIAGNOSIS — E782 Mixed hyperlipidemia: Secondary | ICD-10-CM | POA: Diagnosis not present

## 2018-03-28 DIAGNOSIS — W19XXXA Unspecified fall, initial encounter: Secondary | ICD-10-CM

## 2018-03-28 DIAGNOSIS — G894 Chronic pain syndrome: Secondary | ICD-10-CM

## 2018-03-28 DIAGNOSIS — Y92009 Unspecified place in unspecified non-institutional (private) residence as the place of occurrence of the external cause: Secondary | ICD-10-CM

## 2018-03-28 DIAGNOSIS — I872 Venous insufficiency (chronic) (peripheral): Secondary | ICD-10-CM

## 2018-03-28 MED ORDER — MUPIROCIN 2 % EX OINT: 1.0000 "application " | TOPICAL_OINTMENT | Freq: Two times a day (BID) | CUTANEOUS | 0 refills | Status: DC

## 2018-03-28 NOTE — Patient Instructions (Signed)
START MUPIROCIN OINTMENT TO NOSE 2 TIMES DAILY X 7 DAYS FOR NASAL INFECTION  Follow up with specialists as scheduled  Continue other medications as ordered  Follow up in 3 mos with [name] for DM, hyperlipidemia, recurrent leg cellulitis

## 2018-03-28 NOTE — Progress Notes (Signed)
Patient ID: [name], male   DOB: 1945-10-29, 72 y.o.   MRN: [id number]   Location:  [facility] OFFICE  Provider: DR Arletha Grippe  Code Status:  Goals of Care:  Advanced Directives 03/21/2018  Does Patient Have a Medical Advance Directive? No  Would patient like information on creating a medical advance directive? Yes (MAU/Ambulatory/Procedural Areas - Information given)     Chief Complaint  Patient presents with  . Follow-up    Needs colonscopy, foot exam and Hep C screening    HPI: Patient is a 72 y.o. male seen today for medical management of chronic diseases.  He c/o recurrent falls. He fell this AM after staggering when he 1st stood from bed. He injured right leg and behind right ear. He is c/a infection in legs that alternates R>>L>>R. He wonders if he has something wrong with his immune system. He has had nasal "bumps" that drain "pus" into his beard intermittent x 3 weeks. He applied topical cream but not sure if it worked. Area really painful and "I took a whole bottle of excedrin".  He saw podiatry Dr Magnus Ivan at [facility] today and new abx dsg applied to RLE.   Lab results reviewed - A1c 6.6%; LDL 86; HDL 37; micro/Cr ratio 7  He declines colonoscopy referral at this time he states until his leg is better  Past Medical History:  Diagnosis Date  . Anemia    NOS  . Anxiety   . 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 BPH (benign prostatic hypertrophy)   . Broken foot Oct. 12, 2013   Right foot Fx  . CAD (coronary artery disease)   . Chronic hyponatremia   . Chronic pain syndrome   . COPD (chronic obstructive pulmonary disease) ([place])   . Depression   . Diabetes mellitus   . Diabetic foot ulcer ([place])   . DJD (degenerative joint disease)    L wrist  . DM2 (diabetes mellitus, type 2) ([place])   . GERD (gastroesophageal reflux disease)   . HLD (hyperlipidemia)   . HTN (hypertension)   . Lumbar disc disease   . Osteoporosis    tx per [facility], Dr  Prudencio Burly, thought due to heavy steriod use after 1978  . Spine fracture    hx, multiple  . Varicose veins     Past Surgical History:  Procedure Laterality Date  . CARDIAC CATHETERIZATION N/A 09/19/2015   Procedure: Left Heart Cath and Coronary Angiography;  Surgeon: Adrian Prows, MD;  Location: [facility];  Service: Cardiovascular;  Laterality: N/A;  . CARPAL TUNNEL RELEASE    . CORONARY ARTERY BYPASS GRAFT N/A 09/22/2015   Procedure: CORONARY ARTERY BYPASS GRAFTING (CABG) times four using the right greater saphenous vein harvested endoscopically and the left internal mammary artery.  LIMA-LAD, SEQ SVG-DIAG & OM, SVG-PD.;  Surgeon: [name], MD;  Location: [facility];  Service: Open Heart Surgery;  Laterality: N/A;  . EYE SURGERY    . L foot open repair jones fracture  2010    5th metetarsal   . left ankle ganglion cyst  1976  . left cataract  1996   right - 1997  . left CTS  2006   R CTS - 2007  . left foot surgery  1998   R surgery/fracture - 1999  . left wrist/hand fusion  2008  . [place], 2004  . NASAL SINUS SURGERY  multiple- x8. last 1997 with obliteration  . REPAIR THORACIC AORTA  2017  . right hand fracture  1969  . ROTATOR CUFF REPAIR  2006   R, than repeat 2011, Dr Theda Sers  . TRIGGER FINGER RELEASE Left 11/11/2014   Procedure: LEFT LONG FINGER RELEASE TRIGGER FINGER/A-1 PULLEY;  Surgeon: Milly Jakob, MD;  Location: [place] [facility];  Service: Orthopedics;  Laterality: Left;  . vocal surgery  1996     reports that he quit smoking about 29 years ago. His smoking use included cigarettes. He has a 45.00 pack-year smoking history. He has never used smokeless tobacco. He reports that he drank alcohol. He reports that he does not use drugs. Social History   Socioeconomic History  . Marital status: Divorced    Spouse name: Not on file  . Number of children: Not on file  . Years of education: Not on file  . Highest education level: Not on  file  Occupational History  . Not on file  Social Needs  . Financial resource strain: Not hard at all  . Food insecurity:    Worry: Never true    Inability: Never true  . Transportation needs:    Medical: No    Non-medical: No  Tobacco Use  . Smoking status: Former Smoker    Packs/day: 1.50    Years: 30.00    Pack years: 45.00    Types: Cigarettes    Last attempt to quit: 11/06/1988    Years since quitting: 29.4  . Smokeless tobacco: Never Used  Substance and Sexual Activity  . Alcohol use: Not Currently    Comment: None in 30 years but drank beer before  . Drug use: No  . Sexual activity: Never  Lifestyle  . Physical activity:    Days per week: 0 days    Minutes per session: 0 min  . Stress: Rather much  Relationships  . Social connections:    Talks on phone: Never    Gets together: More than three times a week    Attends religious service: Never    Active member of club or organization: No    Attends meetings of clubs or organizations: Never    Relationship status: Divorced  . Intimate partner violence:    Fear of current or ex partner: No    Emotionally abused: No    Physically abused: No    Forced sexual activity: No  Other Topics Concern  . Not on file  Social History Narrative   Social History      Diet? I eat about what I want.       Do you drink/eat things with caffeine? yes      Marital status?                  divorced                  What year were you married? 1966 to 2007      Do you live in a house, apartment, assisted living, condo, trailer, etc.? [place]      Is it one or more stories? one      How many persons live in your home? Me only      Do you have any pets in your home? (please list) no      Highest level of education completed? Finished high school      Current or past profession: Clinical biochemist in [employer]  Do you exercise?               no                       Type & how often?      Advanced Directives      Do you  have a living will? no      Do you have a DNR form?     no                             If not, do you want to discuss one?      Do you have signed POA/HPOA for forms? No      Functional Status      Do you have difficulty bathing or dressing yourself? No- shower      Do you have difficulty preparing food or eating? No- Don't want to      Do you have difficulty managing your medications?      Do you have difficulty managing your finances?      Do you have difficulty affording your medications? No- paid for by [employer]    Family History  Problem Relation Age of Onset  . Lung cancer Father 34  . Diabetes Mother   . Osteoporosis Mother   . Throat cancer Unknown        family hx - "bad living" - also lung CA, heart disease and renal failure    . Diabetes Sister   . Suicidality Son 48  . Lung cancer Maternal Grandmother   . Diabetes Maternal Grandfather   . Heart attack Paternal Grandmother     Allergies  Allergen Reactions  . Ivp Dye [Iodinated Diagnostic Agents]     Blood Pressure dropped----pt was pre-medicated with 13 hour prep and did fine with pre-meds--amy 03/09/11   . Penicillins Nausea Only    REACTION: gi upset  . Sulfa Antibiotics Hives  . Sulfonamide Derivatives Hives and Itching  . Lisinopril Cough  . Statins     myalgias  . Propoxyphene N-Acetaminophen Other (See Comments)    Sharp pains- headache    Outpatient Encounter Medications as of 03/28/2018  Medication Sig  . ACCU-CHEK FASTCLIX LANCETS MISC Use as directed to check blood glucose. Dx: E11.51  . Blood Glucose Monitoring Suppl (ACCU-CHEK AVIVA PLUS) w/Device KIT Use as directed to check blood glucose daily. Dx: E11.51  . buPROPion (WELLBUTRIN XL) 150 MG 24 hr tablet Take 150 mg by mouth daily. Along with 300 mg to equal 450 mg daily  . buPROPion (WELLBUTRIN XL) 300 MG 24 hr tablet Take 300 mg by mouth every morning.   . celecoxib (CELEBREX) 200 MG capsule TAKE 1 CAPSULE(200 MG) BY MOUTH  TWICE DAILY  . Cholecalciferol (VITAMIN D) 1000 UNITS capsule Take 5,000 Units by mouth daily.   . clonazePAM (KLONOPIN) 0.5 MG tablet Take 1 mg by mouth daily.  . clopidogrel (PLAVIX) 75 MG tablet Take 75 mg by mouth daily.  . cyclobenzaprine (FLEXERIL) 10 MG tablet TK 1 T PO TID PRN  . desonide (DESOWEN) 0.05 % cream Apply 1 application topically daily as needed ([place]).  . fentaNYL (DURAGESIC - DOSED MCG/HR) 50 MCG/HR Place 50 mcg onto the skin every 3 (three) days.  . furosemide (LASIX) 20 MG tablet Take 20 mg by mouth 2 (two) times daily.  Marland Kitchen gabapentin (NEURONTIN) 400 MG capsule Take 800 mg by  mouth at bedtime.   Marland Kitchen glucose blood (ACCU-CHEK AVIVA PLUS) test strip 1 each by Other route as needed for other. Use as instructed. Dx: E11.51  . HYDROmorphone (DILAUDID) 2 MG tablet Take by mouth every 6 (six) hours as needed for severe pain.  Marland Kitchen JANUVIA 100 MG tablet TAKE 1 TABLET BY MOUTH EVERY DAY  . losartan (COZAAR) 25 MG tablet Take 25 mg by mouth daily.  . Multiple Vitamins-Iron (MULTIVITAMINS WITH IRON) TABS tablet Take 1 tablet by mouth daily.  . nitroGLYCERIN (NITROSTAT) 0.4 MG SL tablet Place 0.4 mg under the tongue every 5 (five) minutes as needed for chest pain.  Marland Kitchen omeprazole (PRILOSEC) 40 MG capsule Take 1 capsule (40 mg total) by mouth daily.  . potassium chloride (K-DUR) 10 MEQ tablet Take 10 mEq by mouth 2 (two) times daily.  Marland Kitchen PROAIR HFA 108 (90 BASE) MCG/ACT inhaler Inhale 2 puffs into the lungs every 6 (six) hours as needed.   Marland Kitchen rOPINIRole (REQUIP) 1 MG tablet Take 1-2 mg by mouth at bedtime.  . tamsulosin (FLOMAX) 0.4 MG CAPS capsule Take 1 capsule (0.4 mg total) by mouth 2 (two) times daily.  Marland Kitchen VITAMIN E PO Take 8,000 Units by mouth daily.  Marland Kitchen zonisamide (ZONEGRAN) 50 MG capsule TAKE 3 CAPSULES(150 MG) BY MOUTH AT BEDTIME  . [DISCONTINUED] clindamycin (CLEOCIN) 300 MG capsule Take 300 mg by mouth 3 (three) times daily.  . [DISCONTINUED] omeprazole (PRILOSEC) 40 MG capsule Take  80 mg by mouth daily.   No facility-administered encounter medications on file as of 03/28/2018.     Review of Systems:  Review of Systems  Musculoskeletal: Positive for arthralgias.  Skin: Positive for wound.  All other systems reviewed and are negative.   Health Maintenance  Topic Date Due  . Hepatitis C Screening  1946-05-22  . OPHTHALMOLOGY EXAM  04/08/1956  . COLONOSCOPY  04/08/1996  . FOOT EXAM  01/24/2018  . INFLUENZA VACCINE  03/16/2018  . HEMOGLOBIN A1C  09/21/2018  . TETANUS/TDAP  10/18/2019  . PNA vac Low Risk Adult  Completed    Physical Exam: Vitals:   03/28/18 1412  BP: 130/62  Pulse: 81  Resp: 16  Temp: 97.7 F (36.5 C)  TempSrc: Oral  SpO2: 95%  Weight: 222 lb 6.4 oz (100.9 kg)  Height: 6' (1.829 m)   Body mass index is 30.16 kg/m. Physical Exam  Constitutional: He is oriented to person, place, and time. He appears well-developed and well-nourished.  HENT:  Mouth/Throat: Oropharynx is clear and moist.  Right nare with green d/c at folliculitis site proximal nare; no redness or bleeding; no FB noted b/l  Eyes: Pupils are equal, round, and reactive to light. No scleral icterus.  Neck: Neck supple. Carotid bruit is not present. No thyromegaly present.  Cardiovascular: Normal rate, regular rhythm and intact distal pulses. Exam reveals no gallop and no friction rub.  Murmur (1/6 SEM) heard. R>L +2 pitting LE edema. No calf TTP; large varicose veins on LLE.  Pulmonary/Chest: Effort normal and breath sounds normal. He has no wheezes. He has no rales. He exhibits no tenderness.  Abdominal: Soft. Bowel sounds are normal. He exhibits no distension, no abdominal bruit, no pulsatile midline mass and no mass. There is no hepatomegaly. There is no tenderness. There is no rebound and no guarding.  obese  Musculoskeletal: He exhibits edema.  Lymphadenopathy:    He has no cervical adenopathy.  Neurological: He is alert and oriented to person, place, and time. He  has normal reflexes.  Skin: Skin is warm and dry. No rash noted.  Contusion right post auricular but no openings  Psychiatric: He has a normal mood and affect. His behavior is normal. Judgment and thought content normal.    Labs reviewed: Basic Metabolic Panel: Recent Labs    12/01/17 03/21/18 0810  NA 132* 129*  K 4.7 4.9  CL  --  94*  CO2  --  28  GLUCOSE  --  124*  BUN 13 18  CREATININE 0.7 0.89  CALCIUM  --  8.8   Liver Function Tests: Recent Labs    12/01/17 03/21/18 0810  AST 23 18  ALT 15 11  ALKPHOS 70  --   BILITOT  --  0.7  PROT  --  7.1   No results for input(s): LIPASE, AMYLASE in the last 8760 hours. No results for input(s): AMMONIA in the last 8760 hours. CBC: Recent Labs    12/01/17  WBC 5.3  HGB 13.5  HCT 41  PLT 191   Lipid Panel: Recent Labs    12/01/17 03/21/18 0810  CHOL 140 139  HDL 50 37*  LDLCALC 79 86  TRIG 57 71  CHOLHDL  --  3.8   Lab Results  Component Value Date   HGBA1C 6.6 (H) 03/21/2018    Procedures since last visit: US Venous Img Lower Bilateral  Result Date: 03/07/2018 CLINICAL DATA:  Right lower leg swelling x2 years post vein harvest for CABG. Previous bilateral lower leg laser surgery. Recent right lower leg cellulitis. Left leg is asymptomatic. EXAM: BILATERAL LOWER EXTREMITY VENOUS DOPPLER ULTRASOUND TECHNIQUE: Gray-scale sonography with graded compression, as well as color Doppler and duplex ultrasound, were performed to evaluate the deep and superficial veins of both lower extremities. Spectral Doppler was utilized to evaluate flow at rest and with distal augmentation maneuvers. A complete right superficial venous insufficiency exam was performed in the upright standing position. I personally performed the technical portion of the exam. COMPARISON:  None. FINDINGS: Deep Venous System: Evaluation of the deep venous system including the common femoral, femoral, profunda femoral, popliteal and calf veins (where visible)  demonstrate no evidence of deep venous thrombosis. The vessels are compressible and demonstrate normal respiratory phasicity and response to augmentation. No evidence of the deep venous reflux. Superficial Venous System: RIGHT SFJ: Patent.  No reflux post augmentation. GSV Prox Thigh: Normal in caliber, patent, no reflux post augmentation. GSV Mid Thigh: Not visualized GSV Lower Thigh: Not visualized GSV at Knee: Not visualized GSV Prox Calf: Not visualized GSV Mid Calf: Not visualized GSV Distal Calf: Not visualized SPJ: Patent with greater than 6 seconds reflux post augmentation SSV Prox: Patent without convincing reflux post augmentation. Multiple branches. SSV Mid: Not visualized SSV Distal: Not visualized Other: Multiple dilated collateral venous channels from the short saphenous vein along the medial and anterior aspect of the knee and proximal calf. IMPRESSION: 1. Normal bilateral lower extremity deep venous systems. No evidence of acute or chronic DVT. 2. Nonvisualization of most of the right great saphenous vein consistent with surgical history of vein harvest. 3. Significant valvular incompetence and reflux in the proximal right short saphenous vein with communication to multiple superficial venous varicosities extending across the knee into the calf. Electronically Signed   By: Lucrezia Europe M.D.   On: 03/07/2018 10:19   [place] [facility] Eval And [facility]mt  Result Date: 03/07/2018 Please refer to "Notes" to see consult details.   Assessment/Plan   ICD-10-CM   1. Folliculitis  of nose L72.9 mupirocin ointment (BACTROBAN) 2 %  2. Diabetes mellitus type 2 with peripheral artery disease (HCC) E11.51   3. Mixed hyperlipidemia E78.2   4. Chronic pain syndrome G89.4   5. Fall in home, initial encounter W19.XXXA    Y92.009    START MUPIROCIN OINTMENT TO NOSE 2 TIMES DAILY X 7 DAYS FOR NASAL INFECTION  Follow up with specialists as scheduled  Continue other medications as ordered  Follow up in 3 mos with  [name] for DM, hyperlipidemia, recurrent leg cellulitis   Furman Trentman S. Perlie Gold  [facility] [address] [place], [place] [address] [phone] Cell (Monday-Friday 8 AM - 5 PM) [phone] After 5 PM and follow prompts

## 2018-04-01 ENCOUNTER — Emergency Department (HOSPITAL_BASED_OUTPATIENT_CLINIC_OR_DEPARTMENT_OTHER): Payer: Medicare Other

## 2018-04-01 ENCOUNTER — Inpatient Hospital Stay (HOSPITAL_COMMUNITY)
Admission: EM | Admit: 2018-04-01 | Discharge: 2018-04-07 | DRG: 579 | Discharge status: Against Medical Advice | Payer: Medicare Other | Attending: Internal Medicine | Admitting: Internal Medicine

## 2018-04-01 ENCOUNTER — Encounter (HOSPITAL_COMMUNITY): Payer: Self-pay | Admitting: Emergency Medicine

## 2018-04-01 DIAGNOSIS — D649 Anemia, unspecified: Secondary | ICD-10-CM

## 2018-04-01 DIAGNOSIS — D638 Anemia in other chronic diseases classified elsewhere: Secondary | ICD-10-CM | POA: Diagnosis present

## 2018-04-01 DIAGNOSIS — E1151 Type 2 diabetes mellitus with diabetic peripheral angiopathy without gangrene: Secondary | ICD-10-CM | POA: Diagnosis present

## 2018-04-01 DIAGNOSIS — E1142 Type 2 diabetes mellitus with diabetic polyneuropathy: Secondary | ICD-10-CM

## 2018-04-01 DIAGNOSIS — L02415 Cutaneous abscess of right lower limb: Secondary | ICD-10-CM

## 2018-04-01 DIAGNOSIS — M810 Age-related osteoporosis without current pathological fracture: Secondary | ICD-10-CM | POA: Diagnosis present

## 2018-04-01 DIAGNOSIS — Z951 Presence of aortocoronary bypass graft: Secondary | ICD-10-CM

## 2018-04-01 DIAGNOSIS — Z79899 Other long term (current) drug therapy: Secondary | ICD-10-CM

## 2018-04-01 DIAGNOSIS — Z7902 Long term (current) use of antithrombotics/antiplatelets: Secondary | ICD-10-CM

## 2018-04-01 DIAGNOSIS — Z882 Allergy status to sulfonamides status: Secondary | ICD-10-CM

## 2018-04-01 DIAGNOSIS — R296 Repeated falls: Secondary | ICD-10-CM | POA: Diagnosis present

## 2018-04-01 DIAGNOSIS — Z91041 Radiographic dye allergy status: Secondary | ICD-10-CM

## 2018-04-01 DIAGNOSIS — E43 Unspecified severe protein-calorie malnutrition: Secondary | ICD-10-CM

## 2018-04-01 DIAGNOSIS — Z87891 Personal history of nicotine dependence: Secondary | ICD-10-CM

## 2018-04-01 DIAGNOSIS — G894 Chronic pain syndrome: Secondary | ICD-10-CM | POA: Diagnosis present

## 2018-04-01 DIAGNOSIS — I2510 Atherosclerotic heart disease of native coronary artery without angina pectoris: Secondary | ICD-10-CM | POA: Diagnosis present

## 2018-04-01 DIAGNOSIS — I10 Essential (primary) hypertension: Secondary | ICD-10-CM | POA: Diagnosis not present

## 2018-04-01 DIAGNOSIS — M7989 Other specified soft tissue disorders: Secondary | ICD-10-CM

## 2018-04-01 DIAGNOSIS — Z6829 Body mass index (BMI) 29.0-29.9, adult: Secondary | ICD-10-CM

## 2018-04-01 DIAGNOSIS — H5462 Unqualified visual loss, left eye, normal vision right eye: Secondary | ICD-10-CM | POA: Diagnosis present

## 2018-04-01 DIAGNOSIS — Z79891 Long term (current) use of opiate analgesic: Secondary | ICD-10-CM

## 2018-04-01 DIAGNOSIS — Z8673 Personal history of transient ischemic attack (TIA), and cerebral infarction without residual deficits: Secondary | ICD-10-CM

## 2018-04-01 DIAGNOSIS — N400 Enlarged prostate without lower urinary tract symptoms: Secondary | ICD-10-CM | POA: Diagnosis present

## 2018-04-01 DIAGNOSIS — L0390 Cellulitis, unspecified: Secondary | ICD-10-CM | POA: Diagnosis present

## 2018-04-01 DIAGNOSIS — I87309 Chronic venous hypertension (idiopathic) without complications of unspecified lower extremity: Secondary | ICD-10-CM | POA: Diagnosis present

## 2018-04-01 DIAGNOSIS — E871 Hypo-osmolality and hyponatremia: Secondary | ICD-10-CM | POA: Diagnosis not present

## 2018-04-01 DIAGNOSIS — K219 Gastro-esophageal reflux disease without esophagitis: Secondary | ICD-10-CM | POA: Diagnosis present

## 2018-04-01 DIAGNOSIS — Z888 Allergy status to other drugs, medicaments and biological substances status: Secondary | ICD-10-CM

## 2018-04-01 DIAGNOSIS — L97919 Non-pressure chronic ulcer of unspecified part of right lower leg with unspecified severity: Secondary | ICD-10-CM | POA: Diagnosis not present

## 2018-04-01 DIAGNOSIS — F419 Anxiety disorder, unspecified: Secondary | ICD-10-CM | POA: Diagnosis present

## 2018-04-01 DIAGNOSIS — L03115 Cellulitis of right lower limb: Principal | ICD-10-CM | POA: Diagnosis present

## 2018-04-01 DIAGNOSIS — Z7984 Long term (current) use of oral hypoglycemic drugs: Secondary | ICD-10-CM

## 2018-04-01 DIAGNOSIS — R000 Tachycardia, unspecified: Secondary | ICD-10-CM | POA: Diagnosis present

## 2018-04-01 DIAGNOSIS — E785 Hyperlipidemia, unspecified: Secondary | ICD-10-CM | POA: Diagnosis present

## 2018-04-01 DIAGNOSIS — Z880 Allergy status to penicillin: Secondary | ICD-10-CM

## 2018-04-01 DIAGNOSIS — I87311 Chronic venous hypertension (idiopathic) with ulcer of right lower extremity: Secondary | ICD-10-CM

## 2018-04-01 DIAGNOSIS — J449 Chronic obstructive pulmonary disease, unspecified: Secondary | ICD-10-CM | POA: Diagnosis present

## 2018-04-01 DIAGNOSIS — R0902 Hypoxemia: Secondary | ICD-10-CM

## 2018-04-01 HISTORY — DX: Cellulitis of unspecified part of limb: L03.119

## 2018-04-01 HISTORY — DX: Cutaneous abscess of limb, unspecified: L02.419

## 2018-04-01 LAB — CBC WITH DIFFERENTIAL/PLATELET
ABS IMMATURE GRANULOCYTES: 0.1 10*3/uL (ref 0.0–0.1)
Basophils Absolute: 0 10*3/uL (ref 0.0–0.1)
Basophils Relative: 0 %
Eosinophils Absolute: 0.2 10*3/uL (ref 0.0–0.7)
Eosinophils Relative: 3 %
HCT: 37.5 % — ABNORMAL LOW (ref 39.0–52.0)
HEMOGLOBIN: 12.1 g/dL — AB (ref 13.0–17.0)
IMMATURE GRANULOCYTES: 1 %
LYMPHS ABS: 0.9 10*3/uL (ref 0.7–4.0)
Lymphocytes Relative: 13 %
MCH: 30.9 pg (ref 26.0–34.0)
MCHC: 32.3 g/dL (ref 30.0–36.0)
MCV: 95.7 fL (ref 78.0–100.0)
MONO ABS: 1 10*3/uL (ref 0.1–1.0)
MONOS PCT: 14 %
NEUTROS ABS: 4.8 10*3/uL (ref 1.7–7.7)
NEUTROS PCT: 69 %
Platelets: 332 10*3/uL (ref 150–400)
RBC: 3.92 MIL/uL — ABNORMAL LOW (ref 4.22–5.81)
RDW: 13 % (ref 11.5–15.5)
WBC: 7.1 10*3/uL (ref 4.0–10.5)

## 2018-04-01 LAB — GLUCOSE, CAPILLARY: Glucose-Capillary: 92 mg/dL (ref 70–99)

## 2018-04-01 LAB — COMPREHENSIVE METABOLIC PANEL
ALBUMIN: 3.1 g/dL — AB (ref 3.5–5.0)
ALT: 15 U/L (ref 0–44)
ANION GAP: 10 (ref 5–15)
AST: 22 U/L (ref 15–41)
Alkaline Phosphatase: 95 U/L (ref 38–126)
BUN: 13 mg/dL (ref 8–23)
CHLORIDE: 95 mmol/L — AB (ref 98–111)
CO2: 26 mmol/L (ref 22–32)
Calcium: 8.9 mg/dL (ref 8.9–10.3)
Creatinine, Ser: 0.87 mg/dL (ref 0.61–1.24)
GFR calc non Af Amer: >60 mL/min (ref >60)
GLUCOSE: 125 mg/dL — AB (ref 70–99)
Potassium: 4.6 mmol/L (ref 3.5–5.1)
SODIUM: 131 mmol/L — AB (ref 135–145)
Total Bilirubin: 0.7 mg/dL (ref 0.3–1.2)
Total Protein: 7.2 g/dL (ref 6.5–8.1)

## 2018-04-01 LAB — TROPONIN I: Troponin I: <0.03 ng/mL (ref <0.03)

## 2018-04-01 LAB — I-STAT CG4 LACTIC ACID, ED: LACTIC ACID, VENOUS: 1.02 mmol/L (ref 0.5–1.9)

## 2018-04-01 MED ORDER — ZONISAMIDE 25 MG PO CAPS
150.0000 mg | ORAL_CAPSULE | Freq: Every day | ORAL | Status: DC
  Administered 2018-04-01 – 2018-04-06 (×5): 150 mg via ORAL
  Filled 2018-04-01 (×8): qty 2

## 2018-04-01 MED ORDER — ZONISAMIDE 25 MG PO CAPS
150.0000 mg | ORAL_CAPSULE | Freq: Every day | ORAL | Status: DC
  Filled 2018-04-01: qty 6

## 2018-04-01 MED ORDER — ALBUTEROL SULFATE (2.5 MG/3ML) 0.083% IN NEBU: 2.5000 mg | INHALATION_SOLUTION | Freq: Four times a day (QID) | RESPIRATORY_TRACT | Status: DC | PRN

## 2018-04-01 MED ORDER — LINAGLIPTIN 5 MG PO TABS
5.0000 mg | ORAL_TABLET | Freq: Every day | ORAL | Status: DC
  Administered 2018-04-02 – 2018-04-06 (×5): 5 mg via ORAL
  Filled 2018-04-01 (×6): qty 1

## 2018-04-01 MED ORDER — TAMSULOSIN HCL 0.4 MG PO CAPS
0.4000 mg | ORAL_CAPSULE | Freq: Two times a day (BID) | ORAL | Status: DC
  Administered 2018-04-01 – 2018-04-06 (×11): 0.4 mg via ORAL
  Filled 2018-04-01 (×12): qty 1

## 2018-04-01 MED ORDER — ACETAMINOPHEN 325 MG PO TABS
650.0000 mg | ORAL_TABLET | Freq: Four times a day (QID) | ORAL | Status: DC | PRN
  Administered 2018-04-02 – 2018-04-05 (×3): 650 mg via ORAL
  Filled 2018-04-01 (×4): qty 2

## 2018-04-01 MED ORDER — TAB-A-VITE/IRON PO TABS
1.0000 | ORAL_TABLET | Freq: Every day | ORAL | Status: DC
  Administered 2018-04-02 – 2018-04-06 (×5): 1 via ORAL
  Filled 2018-04-01 (×7): qty 1

## 2018-04-01 MED ORDER — PANTOPRAZOLE SODIUM 40 MG PO TBEC
40.0000 mg | DELAYED_RELEASE_TABLET | Freq: Every day | ORAL | Status: DC
  Administered 2018-04-02 – 2018-04-07 (×6): 40 mg via ORAL
  Filled 2018-04-01 (×6): qty 1

## 2018-04-01 MED ORDER — LOSARTAN POTASSIUM 25 MG PO TABS
25.0000 mg | ORAL_TABLET | Freq: Every day | ORAL | Status: DC
  Administered 2018-04-02 – 2018-04-07 (×6): 25 mg via ORAL
  Filled 2018-04-01 (×6): qty 1

## 2018-04-01 MED ORDER — ROPINIROLE HCL 1 MG PO TABS
2.0000 mg | ORAL_TABLET | Freq: Every day | ORAL | Status: DC
  Administered 2018-04-01 – 2018-04-06 (×6): 2 mg via ORAL
  Filled 2018-04-01 (×7): qty 2

## 2018-04-01 MED ORDER — MUPIROCIN 2 % EX OINT
1.0000 "application " | TOPICAL_OINTMENT | Freq: Two times a day (BID) | CUTANEOUS | Status: DC
  Administered 2018-04-02 – 2018-04-06 (×9): 1 via NASAL
  Filled 2018-04-01 (×3): qty 22

## 2018-04-01 MED ORDER — CYCLOBENZAPRINE HCL 10 MG PO TABS
10.0000 mg | ORAL_TABLET | Freq: Three times a day (TID) | ORAL | Status: DC | PRN
  Administered 2018-04-03 – 2018-04-06 (×5): 10 mg via ORAL
  Filled 2018-04-01 (×5): qty 1

## 2018-04-01 MED ORDER — FUROSEMIDE 20 MG PO TABS
20.0000 mg | ORAL_TABLET | Freq: Two times a day (BID) | ORAL | Status: DC
  Administered 2018-04-02 – 2018-04-07 (×10): 20 mg via ORAL
  Filled 2018-04-01 (×12): qty 1

## 2018-04-01 MED ORDER — PIPERACILLIN-TAZOBACTAM 3.375 G IVPB 30 MIN
3.3750 g | Freq: Once | INTRAVENOUS | Status: AC
  Administered 2018-04-01: 3.375 g via INTRAVENOUS
  Filled 2018-04-01: qty 50

## 2018-04-01 MED ORDER — CLONAZEPAM 1 MG PO TABS
1.0000 mg | ORAL_TABLET | Freq: Every day | ORAL | Status: DC
  Administered 2018-04-02 – 2018-04-07 (×6): 1 mg via ORAL
  Filled 2018-04-01 (×6): qty 1

## 2018-04-01 MED ORDER — INSULIN ASPART 100 UNIT/ML ~~LOC~~ SOLN
0.0000 [IU] | Freq: Three times a day (TID) | SUBCUTANEOUS | Status: DC
  Administered 2018-04-04 – 2018-04-06 (×2): 1 [IU] via SUBCUTANEOUS

## 2018-04-01 MED ORDER — VITAMIN D 1000 UNITS PO TABS
5000.0000 [IU] | ORAL_TABLET | Freq: Every day | ORAL | Status: DC
  Administered 2018-04-02 – 2018-04-07 (×6): 5000 [IU] via ORAL
  Filled 2018-04-01 (×6): qty 5

## 2018-04-01 MED ORDER — HYDROMORPHONE HCL 2 MG PO TABS
2.0000 mg | ORAL_TABLET | Freq: Four times a day (QID) | ORAL | Status: DC | PRN
  Administered 2018-04-02 – 2018-04-06 (×12): 2 mg via ORAL
  Filled 2018-04-01 (×12): qty 1

## 2018-04-01 MED ORDER — CLOPIDOGREL BISULFATE 75 MG PO TABS
75.0000 mg | ORAL_TABLET | Freq: Every day | ORAL | Status: DC
  Administered 2018-04-02 – 2018-04-07 (×6): 75 mg via ORAL
  Filled 2018-04-01 (×6): qty 1

## 2018-04-01 MED ORDER — ACETAMINOPHEN 650 MG RE SUPP: 650.0000 mg | Freq: Four times a day (QID) | RECTAL | Status: DC | PRN

## 2018-04-01 MED ORDER — SODIUM CHLORIDE 0.9 % IV SOLN
INTRAVENOUS | Status: DC
  Administered 2018-04-01: 21:00:00 via INTRAVENOUS

## 2018-04-01 MED ORDER — FENTANYL 50 MCG/HR TD PT72
50.0000 ug | MEDICATED_PATCH | TRANSDERMAL | Status: DC
  Administered 2018-04-01 – 2018-04-04 (×2): 50 ug via TRANSDERMAL
  Filled 2018-04-01 (×2): qty 2

## 2018-04-01 MED ORDER — GABAPENTIN 400 MG PO CAPS
800.0000 mg | ORAL_CAPSULE | Freq: Every day | ORAL | Status: DC
  Administered 2018-04-01 – 2018-04-06 (×6): 800 mg via ORAL
  Filled 2018-04-01 (×6): qty 2

## 2018-04-01 MED ORDER — SODIUM CHLORIDE 0.9 % IV SOLN
2.0000 g | INTRAVENOUS | Status: DC
  Administered 2018-04-01 – 2018-04-06 (×6): 2 g via INTRAVENOUS
  Filled 2018-04-01 (×7): qty 20

## 2018-04-01 MED ORDER — INSULIN ASPART 100 UNIT/ML ~~LOC~~ SOLN: 0.0000 [IU] | Freq: Every day | SUBCUTANEOUS | Status: DC

## 2018-04-01 MED ORDER — POTASSIUM CHLORIDE CRYS ER 10 MEQ PO TBCR
10.0000 meq | EXTENDED_RELEASE_TABLET | Freq: Two times a day (BID) | ORAL | Status: DC
  Administered 2018-04-01 – 2018-04-06 (×11): 10 meq via ORAL
  Filled 2018-04-01 (×13): qty 1

## 2018-04-01 MED ORDER — HEPARIN SODIUM (PORCINE) 5000 UNIT/ML IJ SOLN
5000.0000 [IU] | Freq: Three times a day (TID) | INTRAMUSCULAR | Status: DC
  Administered 2018-04-01 – 2018-04-07 (×17): 5000 [IU] via SUBCUTANEOUS
  Filled 2018-04-01 (×17): qty 1

## 2018-04-01 MED ORDER — CELECOXIB 100 MG PO CAPS
100.0000 mg | ORAL_CAPSULE | Freq: Two times a day (BID) | ORAL | Status: DC
  Administered 2018-04-01 – 2018-04-06 (×11): 100 mg via ORAL
  Filled 2018-04-01 (×14): qty 1

## 2018-04-01 MED ORDER — VANCOMYCIN HCL 10 G IV SOLR
2000.0000 mg | Freq: Once | INTRAVENOUS | Status: AC
  Administered 2018-04-01: 2000 mg via INTRAVENOUS
  Filled 2018-04-01: qty 2000

## 2018-04-01 MED ORDER — BUPROPION HCL ER (XL) 150 MG PO TB24
300.0000 mg | ORAL_TABLET | ORAL | Status: DC
  Administered 2018-04-02 – 2018-04-07 (×6): 300 mg via ORAL
  Filled 2018-04-01 (×7): qty 1

## 2018-04-01 MED ORDER — VANCOMYCIN HCL IN DEXTROSE 750-5 MG/150ML-% IV SOLN
750.0000 mg | Freq: Two times a day (BID) | INTRAVENOUS | Status: DC
  Administered 2018-04-02 – 2018-04-06 (×10): 750 mg via INTRAVENOUS
  Filled 2018-04-01 (×11): qty 150

## 2018-04-01 NOTE — ED Notes (Signed)
Patient transported to Vascular 

## 2018-04-01 NOTE — Progress Notes (Addendum)
Pharmacy Antibiotic Note  [name] is a 72 y.o. male admitted on 04/01/2018 with RLE swelling and concern for cellulitis. Dopplers negative for DVT. Pharmacy has been consulted for Vancomycin dosing.  SCr 0.87, normalized CrCl~70 ml/min.   Plan: - Vancomycin 2000 mg IV x 1 followed by 750 mg IV every 12 hours - Rocephin 2g IV every 24 hours - Will continue to follow renal function, culture results, LOT, and antibiotic de-escalation plans      Temp (24hrs), Avg:98.5 F (36.9 C), Min:98.5 F (36.9 C), Max:98.5 F (36.9 C)  Recent Labs  Lab 04/01/18 1359 04/01/18 1410  WBC 7.1  --   CREATININE 0.87  --   LATICACIDVEN  --  1.02    Estimated Creatinine Clearance: 95.7 mL/min (by C-G formula based on SCr of 0.87 mg/dL).    Allergies  Allergen Reactions  . Ivp Dye [Iodinated Diagnostic Agents]     Blood Pressure dropped----pt was pre-medicated with 13 hour prep and did fine with pre-meds--amy 03/09/11   . Penicillins Nausea Only    REACTION: gi upset  . Sulfa Antibiotics Hives  . Sulfonamide Derivatives Hives and Itching  . Lisinopril Cough  . Statins     myalgias  . Propoxyphene N-Acetaminophen Other (See Comments)    Sharp pains- headache    Alycia Rossetti, PharmD, BCPS Pager: [phone] 5:25 PM

## 2018-04-01 NOTE — ED Provider Notes (Signed)
[facility] EMERGENCY DEPARTMENT Provider Note   CSN: [id number] Arrival date & time: 04/01/18  1327     History   Chief Complaint Chief Complaint  Patient presents with  . Leg Pain    HPI [name] is a 72 y.o. male.  Patient with history of diabetes, previous vein harvest from the right leg presents, chronic leg swelling right greater than left, presents with much worsening redness and swelling of the right leg after an injury 4 days ago.  Patient had a fall and sustained an abrasion to his anterior lower leg.  He saw his primary care doctor that they.  Since that time he has had worsening pain and swelling in the leg.  He has had mild drainage to the shin area.  Swelling extends from his toes to the knee.  He denies fevers, nausea, vomiting.  States that he has been on multiple rounds of antibiotics over the past year due to infections in his bilateral feet.  No history of blood clots.  Last ultrasound performed by Dr. Einar Gip in 02/2018 which was negative for DVT.     Past Medical History:  Diagnosis Date  . Anemia    NOS  . Anxiety   . 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 BPH (benign prostatic hypertrophy)   . Broken foot Oct. 12, 2013   Right foot Fx  . CAD (coronary artery disease)   . Chronic hyponatremia   . Chronic pain syndrome   . COPD (chronic obstructive pulmonary disease) ([place])   . Depression   . Diabetes mellitus   . Diabetic foot ulcer ([place])   . DJD (degenerative joint disease)    L wrist  . DM2 (diabetes mellitus, type 2) ([place])   . GERD (gastroesophageal reflux disease)   . HLD (hyperlipidemia)   . HTN (hypertension)   . Lumbar disc disease   . Osteoporosis    tx per [facility], Dr Prudencio Burly, thought due to heavy steriod use after 1978  . Spine fracture    hx, multiple  . Varicose veins     Patient Active Problem List   Diagnosis Date Noted  . 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 Unstable angina ([place]) 09/20/2015  . Chest  pain, rule out acute myocardial infarction 09/18/2015  . Diabetes mellitus type 2 with peripheral artery disease ([place]) 09/18/2015  . Chronic hyponatremia 09/18/2015  . Chest pain on exertion 09/18/2015  . Varicose veins of lower extremities with other complications 72/62/0355  . Occlusion and stenosis of carotid artery without mention of cerebral infarction 10/18/2011  . SINUSITIS, ACUTE 03/13/2010  . BACK PAIN 11/13/2009  . FATIGUE 10/17/2009  . Diabetic neuropathy associated with diabetes mellitus due to underlying condition ([place]) 08/29/2009  . Vitamin D deficiency 08/29/2009  . Hyposmolality and/or hyponatremia 08/29/2009  . VISUAL ACUITY, DECREASED, LEFT EYE 08/29/2009  . PERIPHERAL VASCULAR DISEASE 08/29/2009  . SINUSITIS, CHRONIC 08/29/2009  . NEPHROLITHIASIS 08/29/2009  . FIBROMYALGIA 08/29/2009  . BICEPS TENDON RUPTURE, RIGHT 08/11/2009  . DIABETES MELLITUS, TYPE II 08/05/2009  . Hyperlipidemia 08/05/2009  . ANEMIA-NOS 08/05/2009  . ANXIETY 08/05/2009  . DEPRESSION 08/05/2009  . Chronic pain syndrome 08/05/2009  . Essential hypertension 08/05/2009  . GERD 08/05/2009  . BENIGN PROSTATIC HYPERTROPHY 08/05/2009  . [place] DISEASE, LUMBAR 08/05/2009  . Osteoporosis 08/05/2009    Past Surgical History:  Procedure Laterality Date  . CARDIAC CATHETERIZATION N/A 09/19/2015   Procedure: Left Heart Cath and  Coronary Angiography;  Surgeon: Adrian Prows, MD;  Location: [facility];  Service: Cardiovascular;  Laterality: N/A;  . CARPAL TUNNEL RELEASE    . CORONARY ARTERY BYPASS GRAFT N/A 09/22/2015   Procedure: CORONARY ARTERY BYPASS GRAFTING (CABG) times four using the right greater saphenous vein harvested endoscopically and the left internal mammary artery.  LIMA-LAD, SEQ SVG-DIAG & OM, SVG-PD.;  Surgeon: [name], MD;  Location: [facility];  Service: Open Heart Surgery;  Laterality: N/A;  . EYE SURGERY    . L foot open repair jones fracture  2010    5th metetarsal   . left  ankle ganglion cyst  1976  . left cataract  1996   right - 1997  . left CTS  2006   R CTS - 2007  . left foot surgery  1998   R surgery/fracture - 1999  . left wrist/hand fusion  2008  . [place], 2004  . NASAL SINUS SURGERY     multiple- x8. last 1997 with obliteration  . REPAIR THORACIC AORTA  2017  . right hand fracture  1969  . ROTATOR CUFF REPAIR  2006   R, than repeat 2011, Dr Theda Sers  . TRIGGER FINGER RELEASE Left 11/11/2014   Procedure: LEFT LONG FINGER RELEASE TRIGGER FINGER/A-1 PULLEY;  Surgeon: Milly Jakob, MD;  Location: [facility];  Service: Orthopedics;  Laterality: Left;  . vocal surgery  1996        Home Medications    Prior to Admission medications   Medication Sig Start Date End Date Taking? Authorizing Provider  ACCU-CHEK FASTCLIX LANCETS MISC Use as directed to check blood glucose. Dx: E11.51 01/17/18   [name], NP  Blood Glucose Monitoring Suppl (ACCU-CHEK AVIVA PLUS) w/Device KIT Use as directed to check blood glucose daily. Dx: E11.51 01/17/18   [name], NP  buPROPion (WELLBUTRIN XL) 150 MG 24 hr tablet Take 150 mg by mouth daily. Along with 300 mg to equal 450 mg daily    [provider]  buPROPion (WELLBUTRIN XL) 300 MG 24 hr tablet Take 300 mg by mouth every morning.  10/12/11   [provider]  celecoxib (CELEBREX) 200 MG capsule TAKE 1 CAPSULE(200 MG) BY MOUTH TWICE DAILY 03/20/18   [name], DO  Cholecalciferol (VITAMIN D) 1000 UNITS capsule Take 5,000 Units by mouth daily.     [provider]  clonazePAM (KLONOPIN) 0.5 MG tablet Take 1 mg by mouth daily.    [provider]  clopidogrel (PLAVIX) 75 MG tablet Take 75 mg by mouth daily.    [provider]  cyclobenzaprine (FLEXERIL) 10 MG tablet TK 1 T PO TID PRN 05/05/17   [provider]  desonide (DESOWEN) 0.05 % cream Apply 1 application topically daily as needed ([place]).    [provider]  fentaNYL (DURAGESIC - DOSED MCG/HR) 50 MCG/HR Place 50 mcg onto the skin every 3 (three) days.    [provider]  furosemide (LASIX) 20 MG tablet Take 20 mg by mouth 2 (two) times daily.    [provider]  gabapentin (NEURONTIN) 400 MG capsule Take 800 mg by mouth at bedtime.     [provider]  glucose blood (ACCU-CHEK AVIVA PLUS) test strip 1 each by Other route as needed for other. Use as instructed. Dx: E11.51 01/17/18   [name], NP  HYDROmorphone (DILAUDID) 2 MG tablet Take by mouth every 6 (six) hours as  needed for severe pain.    [provider]  JANUVIA 100 MG tablet TAKE 1 TABLET BY MOUTH EVERY DAY 02/20/18   [name], DO  losartan (COZAAR) 25 MG tablet Take 25 mg by mouth daily.    [provider]  Multiple Vitamins-Iron (MULTIVITAMINS WITH IRON) TABS tablet Take 1 tablet by mouth daily.    [provider]  mupirocin ointment (BACTROBAN) 2 % Place 1 application into the nose 2 (two) times daily. For nasal infection x 7 days 03/28/18   [name], DO  nitroGLYCERIN (NITROSTAT) 0.4 MG SL tablet Place 0.4 mg under the tongue every 5 (five) minutes as needed for chest pain.    [provider]  omeprazole (PRILOSEC) 40 MG capsule Take 1 capsule (40 mg total) by mouth daily. 12/21/17   [name], DO  potassium chloride (K-DUR) 10 MEQ tablet Take 10 mEq by mouth 2 (two) times daily.    [provider]  PROAIR HFA 108 (90 BASE) MCG/ACT inhaler Inhale 2 puffs into the lungs every 6 (six) hours as needed.  03/12/14   [provider]  rOPINIRole (REQUIP) 1 MG tablet Take 1-2 mg by mouth at bedtime.    [provider]  tamsulosin (FLOMAX) 0.4 MG CAPS capsule Take 1 capsule (0.4 mg total) by mouth 2 (two) times daily. 12/21/17   [name], DO  VITAMIN E PO Take 8,000 Units by mouth daily.    [provider]  zonisamide (ZONEGRAN) 50 MG capsule TAKE 3 CAPSULES(150  MG) BY MOUTH AT BEDTIME 02/15/18   [name], DO    Family History Family History  Problem Relation Age of Onset  . Lung cancer Father 24  . Diabetes Mother   . Osteoporosis Mother   . Throat cancer Unknown        family hx - "bad living" - also lung CA, heart disease and renal failure    . Diabetes Sister   . Suicidality Son 3  . Lung cancer Maternal Grandmother   . Diabetes Maternal Grandfather   . Heart attack Paternal Grandmother     Social History Social History   Tobacco Use  . Smoking status: Former Smoker    Packs/day: 1.50    Years: 30.00    Pack years: 45.00    Types: Cigarettes    Last attempt to quit: 11/06/1988    Years since quitting: 29.4  . Smokeless tobacco: Never Used  Substance Use Topics  . Alcohol use: Not Currently    Comment: None in 30 years but drank beer before  . Drug use: No     Allergies   Ivp dye [iodinated diagnostic agents]; Penicillins; Sulfa antibiotics; Sulfonamide derivatives; Lisinopril; Statins; and Propoxyphene n-acetaminophen   Review of Systems Review of Systems  Constitutional: Negative for fever.  HENT: Negative for rhinorrhea and sore throat.   Eyes: Negative for redness.  Respiratory: Negative for cough.   Cardiovascular: Negative for chest pain.  Gastrointestinal: Negative for abdominal pain, diarrhea, nausea and vomiting.  Genitourinary: Negative for dysuria.  Musculoskeletal: Positive for myalgias.  Skin: Positive for color change and wound. Negative for rash.  Neurological: Negative for headaches.     Physical Exam Updated Vital Signs BP (!) 148/105 (BP Location: Left Arm)   Pulse 84   Temp 98.5 F (36.9 C) (Oral)   Resp 16   SpO2 99%   Physical Exam  Constitutional: He appears well-developed and well-nourished.  HENT:  Head: Normocephalic and atraumatic.  Eyes: Conjunctivae  are normal. Right eye exhibits no discharge. Left eye exhibits no discharge.  Neck: Normal range of motion. Neck supple.    Cardiovascular: Normal rate, regular rhythm and normal heart sounds.  Pulmonary/Chest: Effort normal and breath sounds normal.  Abdominal: Soft. There is no tenderness.  Musculoskeletal: He exhibits edema and tenderness.  3+ pitting edema of the R lower extremity with associated erythema and mild warmth extending from the toes up to the knee.  Patient has 2 areas of abrasion to the shin, the most proximal which she states was sustained during his fall.  Mild serous drainage from these areas.  No purulent drainage.  No palpable abscess.  Neurological: He is alert.  Skin: Skin is warm and dry.  Psychiatric: He has a normal mood and affect.  Nursing note and vitals reviewed.    ED Treatments / Results  Labs (all labs ordered are listed, but only abnormal results are displayed) Labs Reviewed  COMPREHENSIVE METABOLIC PANEL - Abnormal; Notable for the following components:      Result Value   Sodium 131 (*)    Chloride 95 (*)    Glucose, Bld 125 (*)    Albumin 3.1 (*)    All other components within normal limits  CBC WITH DIFFERENTIAL/PLATELET - Abnormal; Notable for the following components:   RBC 3.92 (*)    Hemoglobin 12.1 (*)    HCT 37.5 (*)    All other components within normal limits  I-STAT CG4 LACTIC ACID, ED  I-STAT CG4 LACTIC ACID, ED    EKG None  Radiology No results found.  Procedures Procedures (including critical care time)  Medications Ordered in ED Medications  piperacillin-tazobactam (ZOSYN) IVPB 3.375 g (has no administration in time range)  vancomycin (VANCOCIN) 2,000 mg in sodium chloride 0.9 % 500 mL IVPB (has no administration in time range)  vancomycin (VANCOCIN) IVPB 750 mg/150 ml premix (has no administration in time range)        Initial Impression / Assessment and Plan / ED Course  I have reviewed the triage vital signs and the nursing notes.  Pertinent labs & imaging results that were available during my care of the patient were reviewed  by me and considered in my medical decision making (see chart for details).     Patient seen and examined.  Work-up reviewed.  Normal white count and lactic acid.  Ultrasound pending given recent changes to rule out DVT.  Patient may need admission for antibiotic treatment to ensure symptoms are improving given history of diabetes and venous stasis.  Vital signs reviewed and are as follows: BP (!) 148/105 (BP Location: Left Arm)   Pulse 84   Temp 98.5 F (36.9 C) (Oral)   Resp 16   SpO2 99%   5:09 PM [place] neg for DVT. Several inguinal LN's noted.   5:57 PM Dr. Jeanell Sparrow has seen, Abx started. Will admit.   6:11 PM Spoke with Dr. Maudie Mercury who will see.   Final Clinical Impressions(s) / ED Diagnoses   Final diagnoses:  Cellulitis of right lower extremity   Lower extremity infection in patient with DM. Doubt necrotizing infection. DVT neg.   ED Discharge Orders    None       Carlisle Cater, Hershal Coria 04/01/18 1812    Pattricia Boss, MD 04/02/18 [phone]

## 2018-04-01 NOTE — ED Notes (Signed)
Pt back from Vascular.

## 2018-04-01 NOTE — H&P (Signed)
TRH H&P   Patient Demographics:    [name], is a 72 y.o. male  MRN: [id number]   DOB - Aug 13, 1946  Admit Date - 04/01/2018  Outpatient Primary MD for the patient is [name], DO  Referring MD/NP/PA: Carlisle Cater  Outpatient Specialists:  Adrian Prows   Patient coming from: home  Chief Complaint  Patient presents with  . Leg Pain      HPI:    [name]  is a 72 y.o. male, w dm2, hypertension, hyperlipidemia, CAD s/p CABG, Chronic pain, who presents with c/o right lower ext swelling for the past 1 weeks worse than normal.  Pt had scrape on his leg in 2 spots since this past Sunday when he fell on concrete step.  Pt notes redness of the right lower ext for the past 1-2 days up to the knee.  Pt denies fever, chills.   In Ed,  Na 131, K 4.6 Glucose 125 Bun 13, Creatinine 0.87 Alb 3.1 Ast 22, Alt 15 Alk phos 95. T. Bili 0.7  Lactic acid 1.02  Pt will be admitted for cellulitis of the right lower ext, hyponatremia, anemia.       Review of systems:    In addition to the HPI above, No Fever-chills, No Headache, No changes with Vision or hearing, No problems swallowing food or Liquids, No Chest pain, Cough or Shortness of Breath, No Abdominal pain, No Nausea or Vommitting, Bowel movements are regular, No Blood in stool or Urine, No dysuria,  No new joints pains-aches,  No new weakness, tingling, numbness in any extremity, No recent weight gain or loss, No polyuria, polydypsia or polyphagia, No significant Mental Stressors.  A full 10 point Review of Systems was done, except as stated above, all other Review of Systems were negative.   With Past History of the following :    Past Medical History:  Diagnosis Date  . Anemia    NOS  . Anxiety   . 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 BPH (benign prostatic hypertrophy)   .  Broken foot Oct. 12, 2013   Right foot Fx  . CAD (coronary artery disease)   . Chronic hyponatremia   . Chronic pain syndrome   . COPD (chronic obstructive pulmonary disease) ([place])   . Depression   . Diabetes mellitus   . Diabetic foot ulcer ([place])   . DJD (degenerative joint disease)    L wrist  . DM2 (diabetes mellitus, type 2) ([place])   . GERD (gastroesophageal reflux disease)   . HLD (hyperlipidemia)   . HTN (hypertension)   . Lumbar disc disease   . Osteoporosis    tx per [facility], Dr Prudencio Burly, thought due to heavy steriod use after 1978  . Spine fracture    hx, multiple  . Varicose veins  Past Surgical History:  Procedure Laterality Date  . CARDIAC CATHETERIZATION N/A 09/19/2015   Procedure: Left Heart Cath and Coronary Angiography;  Surgeon: Adrian Prows, MD;  Location: [facility];  Service: Cardiovascular;  Laterality: N/A;  . CARPAL TUNNEL RELEASE    . CORONARY ARTERY BYPASS GRAFT N/A 09/22/2015   Procedure: CORONARY ARTERY BYPASS GRAFTING (CABG) times four using the right greater saphenous vein harvested endoscopically and the left internal mammary artery.  LIMA-LAD, SEQ SVG-DIAG & OM, SVG-PD.;  Surgeon: [name], MD;  Location: [facility];  Service: Open Heart Surgery;  Laterality: N/A;  . EYE SURGERY    . L foot open repair jones fracture  2010    5th metetarsal   . left ankle ganglion cyst  1976  . left cataract  1996   right - 1997  . left CTS  2006   R CTS - 2007  . left foot surgery  1998   R surgery/fracture - 1999  . left wrist/hand fusion  2008  . [place], 2004  . NASAL SINUS SURGERY     multiple- x8. last 1997 with obliteration  . REPAIR THORACIC AORTA  2017  . right hand fracture  1969  . ROTATOR CUFF REPAIR  2006   R, than repeat 2011, Dr Theda Sers  . TRIGGER FINGER RELEASE Left 11/11/2014   Procedure: LEFT LONG FINGER RELEASE TRIGGER FINGER/A-1 PULLEY;  Surgeon: Milly Jakob, MD;  Location: [facility];  Service:  Orthopedics;  Laterality: Left;  . vocal surgery  1996      Social History:     Social History   Tobacco Use  . Smoking status: Former Smoker    Packs/day: 1.50    Years: 30.00    Pack years: 45.00    Types: Cigarettes    Last attempt to quit: 11/06/1988    Years since quitting: 29.4  . Smokeless tobacco: Never Used  Substance Use Topics  . Alcohol use: Not Currently    Comment: None in 30 years but drank beer before     Lives - at home  Mobility - walks by self   Family History :     Family History  Problem Relation Age of Onset  . Lung cancer Father 53  . Diabetes Mother   . Osteoporosis Mother   . Throat cancer Unknown        family hx - "bad living" - also lung CA, heart disease and renal failure    . Diabetes Sister   . Suicidality Son 4  . Lung cancer Maternal Grandmother   . Diabetes Maternal Grandfather   . Heart attack Paternal Grandmother        Home Medications:   Prior to Admission medications   Medication Sig Start Date End Date Taking? Authorizing Provider  ACCU-CHEK FASTCLIX LANCETS MISC Use as directed to check blood glucose. Dx: E11.51 01/17/18   [name], NP  Blood Glucose Monitoring Suppl (ACCU-CHEK AVIVA PLUS) w/Device KIT Use as directed to check blood glucose daily. Dx: E11.51 01/17/18   [name], NP  buPROPion (WELLBUTRIN XL) 150 MG 24 hr tablet Take 150 mg by mouth daily. Along with 300 mg to equal 450 mg daily    [provider]  buPROPion (WELLBUTRIN XL) 300 MG 24 hr tablet Take 300 mg by mouth every morning.  10/12/11   [provider]  celecoxib (CELEBREX) 200 MG capsule TAKE 1 CAPSULE(200 MG) BY MOUTH TWICE  DAILY 03/20/18   [name], DO  Cholecalciferol (VITAMIN D) 1000 UNITS capsule Take 5,000 Units by mouth daily.     [provider]  clonazePAM (KLONOPIN) 0.5 MG tablet Take 1 mg by mouth daily.    [provider]  clopidogrel (PLAVIX) 75 MG tablet Take 75 mg by mouth daily.     [provider]  cyclobenzaprine (FLEXERIL) 10 MG tablet TK 1 T PO TID PRN 05/05/17   [provider]  desonide (DESOWEN) 0.05 % cream Apply 1 application topically daily as needed ([place]).    [provider]  fentaNYL (DURAGESIC - DOSED MCG/HR) 50 MCG/HR Place 50 mcg onto the skin every 3 (three) days.    [provider]  furosemide (LASIX) 20 MG tablet Take 20 mg by mouth 2 (two) times daily.    [provider]  gabapentin (NEURONTIN) 400 MG capsule Take 800 mg by mouth at bedtime.     [provider]  glucose blood (ACCU-CHEK AVIVA PLUS) test strip 1 each by Other route as needed for other. Use as instructed. Dx: E11.51 01/17/18   [name], NP  HYDROmorphone (DILAUDID) 2 MG tablet Take by mouth every 6 (six) hours as needed for severe pain.    [provider]  JANUVIA 100 MG tablet TAKE 1 TABLET BY MOUTH EVERY DAY 02/20/18   [name], DO  losartan (COZAAR) 25 MG tablet Take 25 mg by mouth daily.    [provider]  Multiple Vitamins-Iron (MULTIVITAMINS WITH IRON) TABS tablet Take 1 tablet by mouth daily.    [provider]  mupirocin ointment (BACTROBAN) 2 % Place 1 application into the nose 2 (two) times daily. For nasal infection x 7 days 03/28/18   [name], DO  nitroGLYCERIN (NITROSTAT) 0.4 MG SL tablet Place 0.4 mg under the tongue every 5 (five) minutes as needed for chest pain.    [provider]  omeprazole (PRILOSEC) 40 MG capsule Take 1 capsule (40 mg total) by mouth daily. 12/21/17   [name], DO  potassium chloride (K-DUR) 10 MEQ tablet Take 10 mEq by mouth 2 (two) times daily.    [provider]  PROAIR HFA 108 (90 BASE) MCG/ACT inhaler Inhale 2 puffs into the lungs every 6 (six) hours as needed.  03/12/14   [provider]  rOPINIRole (REQUIP) 1 MG tablet Take 1-2 mg by mouth at bedtime.    [provider]  tamsulosin (FLOMAX) 0.4 MG CAPS capsule  Take 1 capsule (0.4 mg total) by mouth 2 (two) times daily. 12/21/17   [name], DO  VITAMIN E PO Take 8,000 Units by mouth daily.    [provider]  zonisamide (ZONEGRAN) 50 MG capsule TAKE 3 CAPSULES(150 MG) BY MOUTH AT BEDTIME 02/15/18   [name], DO     Allergies:     Allergies  Allergen Reactions  . Ivp Dye [Iodinated Diagnostic Agents]     Blood Pressure dropped----pt was pre-medicated with 13 hour prep and did fine with pre-meds--amy 03/09/11   . Penicillins Nausea Only    REACTION: gi upset Has patient had a PCN reaction causing immediate rash, facial/tongue/throat swelling, SOB or lightheadedness with hypotension: No Has patient had a PCN reaction causing severe rash involving mucus membranes or skin necrosis: No Has patient had a PCN reaction that required hospitalization: No Has patient had a PCN reaction occurring within the last 10 years: No If all of the above answers are "NO", then may  proceed with Cephalosporin use.   . Sulfa Antibiotics Hives  . Sulfonamide Derivatives Hives and Itching  . Lisinopril Cough  . Statins Other (See Comments)    myalgias  . Propoxyphene N-Acetaminophen Other (See Comments)    Sharp pains- headache     Physical Exam:   Vitals  Blood pressure 102/82, pulse 97, temperature 98.3 F (36.8 C), temperature source Oral, resp. rate 19, height 6' (1.829 m), weight 98.7 kg, SpO2 100 %.   1. General  lying in bed in NAD,    2. Normal affect and insight, Not Suicidal or Homicidal, Awake Alert, Oriented X 3.  3. No F.N deficits, ALL C.Nerves Intact, Strength 5/5 all 4 extremities, Sensation intact all 4 extremities, Plantars down going.  4. Ears and Eyes appear Normal, Conjunctivae clear, PERRLA. Moist Oral Mucosa.  5. Supple Neck, No JVD, No cervical lymphadenopathy appriciated, No Carotid Bruits.  6. Symmetrical Chest wall movement, Good air movement bilaterally, CTAB.  7. RRR, No Gallops, Rubs or Murmurs, No  Parasternal Heave.  8. Positive Bowel Sounds, Abdomen Soft, No tenderness, No organomegaly appriciated,No rebound -guarding or rigidity.  9.  No Cyanosis,  Redness and swelling and warmth of the right distal lower ext up to knee.  Slight scrape on the anterior right distal lower ext x2, 1st about 1/2 up and 2nd about 3/4 up to knee. Both about 1cm, probably source of infection.   10. Good muscle tone,  joints appear normal , no effusions, Normal ROM.  11. No Palpable Lymph Nodes in Neck or Axillae      Data Review:    CBC Recent Labs  Lab 04/01/18 1359  WBC 7.1  HGB 12.1*  HCT 37.5*  PLT 332  MCV 95.7  MCH 30.9  MCHC 32.3  RDW 13.0  LYMPHSABS 0.9  MONOABS 1.0  EOSABS 0.2  BASOSABS 0.0   ------------------------------------------------------------------------------------------------------------------  Chemistries  Recent Labs  Lab 04/01/18 1359  NA 131*  K 4.6  CL 95*  CO2 26  GLUCOSE 125*  BUN 13  CREATININE 0.87  CALCIUM 8.9  AST 22  ALT 15  ALKPHOS 95  BILITOT 0.7   ------------------------------------------------------------------------------------------------------------------ estimated creatinine clearance is 94.7 mL/min (by C-G formula based on SCr of 0.87 mg/dL). ------------------------------------------------------------------------------------------------------------------ No results for input(s): TSH, T4TOTAL, T3FREE, THYROIDAB in the last 72 hours.  Invalid input(s): FREET3  Coagulation profile No results for input(s): INR, PROTIME in the last 168 hours. ------------------------------------------------------------------------------------------------------------------- No results for input(s): DDIMER in the last 72 hours. -------------------------------------------------------------------------------------------------------------------  Cardiac Enzymes No results for input(s): CKMB, TROPONINI, MYOGLOBIN in the last 168 hours.  Invalid  input(s): CK ------------------------------------------------------------------------------------------------------------------ No results found for: BNP   ---------------------------------------------------------------------------------------------------------------  Urinalysis    Component Value Date/Time   COLORURINE YELLOW 12/11/2016 [place] 12/11/2016 2313   LABSPEC 1.005 12/11/2016 2313   PHURINE 7.0 12/11/2016 2313   GLUCOSEU NEGATIVE 12/11/2016 2313   GLUCOSEU NEGATIVE 11/15/2014 1003   HGBUR NEGATIVE 12/11/2016 2313   BILIRUBINUR NEGATIVE 12/11/2016 2313   KETONESUR NEGATIVE 12/11/2016 2313   PROTEINUR NEGATIVE 12/11/2016 2313   UROBILINOGEN 0.2 11/15/2014 1003   NITRITE NEGATIVE 12/11/2016 2313   LEUKOCYTESUR NEGATIVE 12/11/2016 2313    ----------------------------------------------------------------------------------------------------------------   Imaging Results:    No results found.     Assessment & Plan:    Principal Problem:   Cellulitis Active Problems:   ANEMIA-NOS   Essential hypertension   Hyponatremia   Cellulitis of right lower extremity   Tachycardia    Cellulitis  of the right distal lower ext Blood culture x2 Check ESR,  vanco iv, pharmacy to dose Initially I wrote for levaquin in addition but pharmacy recommended rocephin as pcn allergy minimal  Tachycardia Tele Trop I q6h x3  Hypoxia (? Erroneous reading) CXR  Anemia Check cbc in am  Hyponatremia Hydrate gently with ns iv Check cmp in am  CAD s/p CABG PVD Cont plavix 13m po qday  Dm2 Cont januvia=>Tradjenta 5546mpo qday fsbs ac and qhs, ISS  Chronic pain Cont Fentanyl 50 micrograms po qday Cont dialudid 46m25mo q6h prn Cont Gabapentin 800m64m qhs Cont Flexeril 10mg546mtid prn  Decrease Celebrex 100mg 7mid  Anxiety Cont clonazepam Cont Wellbutrin, decrease to 300mg p73may (not taking 450mg)  77mProphylaxis   Lovenox - SCDs   AM Labs  Ordered, also please review Full Orders  Family Communication: Admission, patients condition and plan of care including tests being ordered have been discussed with the patient who indicate understanding and agree with the plan and Code Status.  Code Status  FULL CODE  Likely DC to  home  Condition GUARDED   Consults called:  none  Admission status: observation, might require inpatient admission depending on how cellulitis responds to abx Pt will require observation admission due to need for iv abx.    Time spent in minutes : 60    Drayden Lukas KiJani Gravel8/17/2019 at 9:06 PM  Between 7am to 7pm - Pager - [phone] 7pm go to [email or web address] - password [email or web address]  [facility] - Office  [phone]

## 2018-04-01 NOTE — ED Notes (Signed)
Leg is hot to touch

## 2018-04-01 NOTE — ED Notes (Signed)
Pt stood to urinate with assistance

## 2018-04-01 NOTE — Progress Notes (Signed)
VASCULAR LAB PRELIMINARY  PRELIMINARY  PRELIMINARY  PRELIMINARY  Right lower extremity venous duplex completed.    Preliminary report:  There is no DVT or SVT noted in the right lower extremity.  There is interstitial fluid noted throughout the right calf.  There are multiple enlarged inguinal lymph nodes noted, bilaterally.   Called results to Alben Spittle  [facility], Ornella Coderre, RVT 04/01/2018, 5:06 PM

## 2018-04-01 NOTE — ED Triage Notes (Signed)
Pt presents to ED for assessment of right leg swelling (almost double the size of his left, pitting +3).  Patient states he has been having difficulties with his leg swelling since his heart surgery in February 2017, but he had a fall a few days ago, with an abrasion to his right leg, and he has noted the swelling has double since.  Pt c/o pain to his "knee joint".

## 2018-04-02 ENCOUNTER — Observation Stay (HOSPITAL_COMMUNITY): Payer: Medicare Other

## 2018-04-02 DIAGNOSIS — E871 Hypo-osmolality and hyponatremia: Secondary | ICD-10-CM | POA: Diagnosis not present

## 2018-04-02 DIAGNOSIS — I10 Essential (primary) hypertension: Secondary | ICD-10-CM

## 2018-04-02 DIAGNOSIS — L03115 Cellulitis of right lower limb: Secondary | ICD-10-CM | POA: Diagnosis not present

## 2018-04-02 LAB — COMPREHENSIVE METABOLIC PANEL
ALBUMIN: 2.6 g/dL — AB (ref 3.5–5.0)
ALK PHOS: 81 U/L (ref 38–126)
ALT: 13 U/L (ref 0–44)
ANION GAP: 11 (ref 5–15)
AST: 18 U/L (ref 15–41)
BILIRUBIN TOTAL: 0.8 mg/dL (ref 0.3–1.2)
BUN: 13 mg/dL (ref 8–23)
CO2: 22 mmol/L (ref 22–32)
Calcium: 8 mg/dL — ABNORMAL LOW (ref 8.9–10.3)
Chloride: 97 mmol/L — ABNORMAL LOW (ref 98–111)
Creatinine, Ser: 0.8 mg/dL (ref 0.61–1.24)
GFR calc Af Amer: >60 mL/min (ref >60)
GFR calc non Af Amer: >60 mL/min (ref >60)
GLUCOSE: 87 mg/dL (ref 70–99)
Potassium: 4.1 mmol/L (ref 3.5–5.1)
Sodium: 130 mmol/L — ABNORMAL LOW (ref 135–145)
TOTAL PROTEIN: 6.2 g/dL — AB (ref 6.5–8.1)

## 2018-04-02 LAB — GLUCOSE, CAPILLARY
GLUCOSE-CAPILLARY: 87 mg/dL (ref 70–99)
GLUCOSE-CAPILLARY: 98 mg/dL (ref 70–99)
Glucose-Capillary: 123 mg/dL — ABNORMAL HIGH (ref 70–99)
Glucose-Capillary: 80 mg/dL (ref 70–99)

## 2018-04-02 LAB — CBC
HCT: 34.9 % — ABNORMAL LOW (ref 39.0–52.0)
HEMOGLOBIN: 11.4 g/dL — AB (ref 13.0–17.0)
MCH: 30.6 pg (ref 26.0–34.0)
MCHC: 32.7 g/dL (ref 30.0–36.0)
MCV: 93.8 fL (ref 78.0–100.0)
Platelets: 324 10*3/uL (ref 150–400)
RBC: 3.72 MIL/uL — ABNORMAL LOW (ref 4.22–5.81)
RDW: 13 % (ref 11.5–15.5)
WBC: 5.8 10*3/uL (ref 4.0–10.5)

## 2018-04-02 LAB — SODIUM, URINE, RANDOM: Sodium, Ur: 39 mmol/L

## 2018-04-02 LAB — OSMOLALITY, URINE: OSMOLALITY UR: 277 mosm/kg — AB (ref 300–900)

## 2018-04-02 LAB — TROPONIN I: Troponin I: <0.03 ng/mL (ref <0.03)

## 2018-04-02 LAB — OSMOLALITY: OSMOLALITY: 269 mosm/kg — AB (ref 275–295)

## 2018-04-02 LAB — MRSA PCR SCREENING: MRSA by PCR: POSITIVE — AB

## 2018-04-02 NOTE — Progress Notes (Signed)
[facility]                                                                              Patient Demographics  [name], is a 72 y.o. male, DOB - [birth year], OFB:[id number]  Admit date - 04/01/2018   Admitting Physician Jani Gravel, MD  Outpatient Primary MD for the patient is [name], DO  Outpatient specialists:   LOS - 0  days   Medical records reviewed and are as summarized below:    Chief Complaint  Patient presents with  . Leg Pain       Brief summary    Patient is a 72 year old male with diabetes type 2, hypertension, hyperlipidemia, CAD status post CABG, chronic pain, presented with right lower extremity swelling for the past 1 week worse than normal.  Patient had fallen on a concrete step and scraped his leg in two spots a week ago.  In the last 1 to 2 days, patient noticed redness of the right leg with swelling, no fevers or chills.  Assessment & Plan    Principal Problem:   Cellulitis of right lower extremity -Per patient he has chronic swelling in the right lower leg after CABG in 2017 -Doppler ultrasound of the right lower extremity showed no DVT or SVT -Follow blood cultures, elevate right LE -MRSA PCR positive, continue IV vancomycin and ceftriaxone -Wound care consult, continue Lasix  Active Problems:  CAD status post CABG, PVD -Continue Plavix    Essential hypertension -BP currently stable, continue losartan  Diabetes mellitus type 2 -Placed on sliding scale insulin, Tradjenta -Hemoglobin A1c 6.6 on 03/21/2018  Chronic pain syndrome -Continue gabapentin, Flexeril, Celebrex  Hyponatremia -Hold IV fluids, obtain serum osmolarity, urine osmolarity, urine sodium   Code Status: Full CODE STATUS DVT Prophylaxis: Heparin subcu Family Communication: Discussed in detail with the patient, all imaging results, lab results explained to the patient    Disposition Plan:   Time Spent in minutes 25 minutes  Procedures:   None  Consultants:   None  Antimicrobials:   IV vancomycin 8/17  IV Rocephin   Medications  Scheduled Meds: . buPROPion  300 mg Oral BH-q7a  . celecoxib  100 mg Oral BID  . cholecalciferol  5,000 Units Oral Daily  . clonazePAM  1 mg Oral Daily  . clopidogrel  75 mg Oral Daily  . fentaNYL  50 mcg Transdermal Q72H  . furosemide  20 mg Oral BID  . gabapentin  800 mg Oral QHS  . heparin  5,000 Units Subcutaneous Q8H  . insulin aspart  0-5 Units Subcutaneous QHS  . insulin aspart  0-9 Units Subcutaneous TID WC  . linagliptin  5 mg Oral Daily  . losartan  25 mg Oral Daily  . multivitamins with iron  1 tablet Oral Daily  . mupirocin ointment  1 application Nasal BID  . pantoprazole  40 mg Oral Daily  . potassium chloride  10 mEq Oral BID  . rOPINIRole  2 mg Oral QHS  . tamsulosin  0.4 mg Oral BID  . zonisamide  150 mg Oral QHS   Continuous Infusions: . sodium chloride 50 mL/hr  at 04/01/18 2059  . cefTRIAXone (ROCEPHIN)  IV 2 g (04/01/18 2224)  . vancomycin 750 mg (04/02/18 0913)   PRN Meds:.acetaminophen **OR** acetaminophen, albuterol, cyclobenzaprine, HYDROmorphone   Antibiotics   Anti-infectives (From admission, onward)   Start     Dose/Rate Route Frequency Ordered Stop   04/02/18 0800  vancomycin (VANCOCIN) IVPB 750 mg/150 ml premix     750 mg 150 mL/hr over 60 Minutes Intravenous Every 12 hours 04/01/18 1725     04/01/18 2200  cefTRIAXone (ROCEPHIN) 2 g in sodium chloride 0.9 % 100 mL IVPB     2 g 200 mL/hr over 30 Minutes Intravenous Every 24 hours 04/01/18 1915     04/01/18 1730  vancomycin (VANCOCIN) 2,000 mg in sodium chloride 0.9 % 500 mL IVPB     2,000 mg 250 mL/hr over 120 Minutes Intravenous  Once 04/01/18 1725 04/01/18 1934   04/01/18 1715  piperacillin-tazobactam (ZOSYN) IVPB 3.375 g     3.375 g 100 mL/hr over 30 Minutes Intravenous  Once 04/01/18 1710 04/01/18 1843        Subjective:   [name] was seen and examined today.  Still feels  tender in the area, erythema improving, no fevers or chills.  Patient denies dizziness, chest pain, shortness of breath, abdominal pain, N/V/D/C, new weakness, numbess, tingling.   Objective:   Vitals:   04/01/18 1945 04/01/18 2039 04/02/18 0500 04/02/18 0505  BP:  102/82  135/66  Pulse: (!) 104 97  77  Resp: 19 19    Temp:  98.3 F (36.8 C)  98.3 F (36.8 C)  TempSrc:  Oral  Oral  SpO2: 97% 100%  96%  Weight:  98.7 kg 98.9 kg   Height:  6' (1.829 m)      Intake/Output Summary (Last 24 hours) at 04/02/2018 1026 Last data filed at 04/02/2018 0618 Gross per 24 hour  Intake 340 ml  Output 175 ml  Net 165 ml     Wt Readings from Last 3 Encounters:  04/02/18 98.9 kg  03/28/18 100.9 kg  03/21/18 100.7 kg     Exam  General: Alert and oriented x 3, NAD  Eyes: PERRLA, EOMI, Anicteric Sclera,  HEENT:  Atraumatic, normocephalic  Cardiovascular: S1 S2 auscultated,. Regular rate and rhythm.  Respiratory: Clear to auscultation bilaterally, no wheezing, rales or rhonchi  Gastrointestinal: Soft, nontender, nondistended, + bowel sounds  Ext: Rt 2+, no edema LLE  Neuro: no new deficits  Musculoskeletal: No digital cyanosis, clubbing  Skin: Redness and swelling, tender on the right distal lower extremity up to the knee.  Two spots on the right lower extremity about 1 cm, tender.  Psych: Normal affect and demeanor, alert and oriented x3    Data Reviewed:  I have personally reviewed following labs and imaging studies  Micro Results Recent Results (from the past 240 hour(s))  MRSA PCR Screening     Status: Abnormal   Collection Time: 04/01/18 10:36 PM  Result Value Ref Range Status   MRSA by PCR POSITIVE (A) NEGATIVE Final    Comment: RESULT CALLED TO, READ BACK BY AND VERIFIED WITH: RN [name] [phone] @0154  [name] Performed at [facility], [address] [place], [place] [address]     Radiology Reports US Venous Img Lower Bilateral  Result Date:  03/07/2018 CLINICAL DATA:  Right lower leg swelling x2 years post vein harvest for CABG. Previous bilateral lower leg laser surgery. Recent right lower leg cellulitis. Left leg is asymptomatic. EXAM:  BILATERAL LOWER EXTREMITY VENOUS DOPPLER ULTRASOUND TECHNIQUE: Gray-scale sonography with graded compression, as well as color Doppler and duplex ultrasound, were performed to evaluate the deep and superficial veins of both lower extremities. Spectral Doppler was utilized to evaluate flow at rest and with distal augmentation maneuvers. A complete right superficial venous insufficiency exam was performed in the upright standing position. I personally performed the technical portion of the exam. COMPARISON:  None. FINDINGS: Deep Venous System: Evaluation of the deep venous system including the common femoral, femoral, profunda femoral, popliteal and calf veins (where visible) demonstrate no evidence of deep venous thrombosis. The vessels are compressible and demonstrate normal respiratory phasicity and response to augmentation. No evidence of the deep venous reflux. Superficial Venous System: RIGHT SFJ: Patent.  No reflux post augmentation. GSV Prox Thigh: Normal in caliber, patent, no reflux post augmentation. GSV Mid Thigh: Not visualized GSV Lower Thigh: Not visualized GSV at Knee: Not visualized GSV Prox Calf: Not visualized GSV Mid Calf: Not visualized GSV Distal Calf: Not visualized SPJ: Patent with greater than 6 seconds reflux post augmentation SSV Prox: Patent without convincing reflux post augmentation. Multiple branches. SSV Mid: Not visualized SSV Distal: Not visualized Other: Multiple dilated collateral venous channels from the short saphenous vein along the medial and anterior aspect of the knee and proximal calf. IMPRESSION: 1. Normal bilateral lower extremity deep venous systems. No evidence of acute or chronic DVT. 2. Nonvisualization of most of the right great saphenous vein consistent with surgical  history of vein harvest. 3. Significant valvular incompetence and reflux in the proximal right short saphenous vein with communication to multiple superficial venous varicosities extending across the knee into the calf. Electronically Signed   By: Lucrezia Europe M.D.   On: 03/07/2018 10:19   [place] Rad Eval And Mgmt  Result Date: 03/07/2018 Please refer to "Notes" to see consult details.   Lab Data:  CBC: Recent Labs  Lab 04/01/18 1359 04/02/18 0252  WBC 7.1 5.8  NEUTROABS 4.8  --   HGB 12.1* 11.4*  HCT 37.5* 34.9*  MCV 95.7 93.8  PLT 332 161   Basic Metabolic Panel: Recent Labs  Lab 04/01/18 1359 04/02/18 0252  NA 131* 130*  K 4.6 4.1  CL 95* 97*  CO2 26 22  GLUCOSE 125* 87  BUN 13 13  CREATININE 0.87 0.80  CALCIUM 8.9 8.0*   GFR: Estimated Creatinine Clearance: 103.1 mL/min (by C-G formula based on SCr of 0.8 mg/dL). Liver Function Tests: Recent Labs  Lab 04/01/18 1359 04/02/18 0252  AST 22 18  ALT 15 13  ALKPHOS 95 81  BILITOT 0.7 0.8  PROT 7.2 6.2*  ALBUMIN 3.1* 2.6*   No results for input(s): LIPASE, AMYLASE in the last 168 hours. No results for input(s): AMMONIA in the last 168 hours. Coagulation Profile: No results for input(s): INR, PROTIME in the last 168 hours. Cardiac Enzymes: Recent Labs  Lab 04/01/18 2154 04/02/18 0252  TROPONINI <0.03 <0.03   BNP (last 3 results) No results for input(s): PROBNP in the last 8760 hours. HbA1C: No results for input(s): HGBA1C in the last 72 hours. CBG: Recent Labs  Lab 04/01/18 2048 04/02/18 0617  GLUCAP 92 87   Lipid Profile: No results for input(s): CHOL, HDL, LDLCALC, TRIG, CHOLHDL, LDLDIRECT in the last 72 hours. Thyroid Function Tests: No results for input(s): TSH, T4TOTAL, FREET4, T3FREE, THYROIDAB in the last 72 hours. Anemia Panel: No results for input(s): VITAMINB12, FOLATE, FERRITIN, TIBC, IRON, RETICCTPCT in the last 72 hours.  Urine analysis:    Component Value Date/Time   COLORURINE YELLOW  12/11/2016 2313   APPEARANCEUR CLEAR 12/11/2016 2313   LABSPEC 1.005 12/11/2016 2313   PHURINE 7.0 12/11/2016 2313   GLUCOSEU NEGATIVE 12/11/2016 2313   GLUCOSEU NEGATIVE 11/15/2014 1003   HGBUR NEGATIVE 12/11/2016 2313   BILIRUBINUR NEGATIVE 12/11/2016 2313   KETONESUR NEGATIVE 12/11/2016 2313   PROTEINUR NEGATIVE 12/11/2016 2313   UROBILINOGEN 0.2 11/15/2014 1003   NITRITE NEGATIVE 12/11/2016 2313   LEUKOCYTESUR NEGATIVE 12/11/2016 2313     [name] M.D. [facility] 04/02/2018, 10:26 AM  Pager: [phone] Between 7am to 7pm - call Pager - [phone]  After 7pm go to [email or web address] - password [email or web address]  Call night coverage person covering after 7pm

## 2018-04-02 NOTE — Plan of Care (Signed)
Care plans reviewed and patient is progressing.  

## 2018-04-03 ENCOUNTER — Encounter (HOSPITAL_COMMUNITY): Payer: Self-pay | Admitting: General Practice

## 2018-04-03 ENCOUNTER — Other Ambulatory Visit: Payer: Self-pay

## 2018-04-03 DIAGNOSIS — R0902 Hypoxemia: Secondary | ICD-10-CM

## 2018-04-03 DIAGNOSIS — L03115 Cellulitis of right lower limb: Secondary | ICD-10-CM | POA: Diagnosis not present

## 2018-04-03 DIAGNOSIS — E871 Hypo-osmolality and hyponatremia: Secondary | ICD-10-CM | POA: Diagnosis not present

## 2018-04-03 LAB — GLUCOSE, CAPILLARY
GLUCOSE-CAPILLARY: 117 mg/dL — AB (ref 70–99)
Glucose-Capillary: 86 mg/dL (ref 70–99)
Glucose-Capillary: 117 mg/dL — ABNORMAL HIGH (ref 70–99)
Glucose-Capillary: 136 mg/dL — ABNORMAL HIGH (ref 70–99)

## 2018-04-03 LAB — BASIC METABOLIC PANEL
Anion gap: 8 (ref 5–15)
BUN: 7 mg/dL — ABNORMAL LOW (ref 8–23)
CALCIUM: 8.1 mg/dL — AB (ref 8.9–10.3)
CO2: 26 mmol/L (ref 22–32)
CREATININE: 0.72 mg/dL (ref 0.61–1.24)
Chloride: 98 mmol/L (ref 98–111)
GFR calc Af Amer: >60 mL/min (ref >60)
GLUCOSE: 104 mg/dL — AB (ref 70–99)
POTASSIUM: 4.2 mmol/L (ref 3.5–5.1)
SODIUM: 132 mmol/L — AB (ref 135–145)

## 2018-04-03 LAB — CBC
HEMATOCRIT: 33.3 % — AB (ref 39.0–52.0)
Hemoglobin: 10.9 g/dL — ABNORMAL LOW (ref 13.0–17.0)
MCH: 30.9 pg (ref 26.0–34.0)
MCHC: 32.7 g/dL (ref 30.0–36.0)
MCV: 94.3 fL (ref 78.0–100.0)
PLATELETS: 313 10*3/uL (ref 150–400)
RBC: 3.53 MIL/uL — ABNORMAL LOW (ref 4.22–5.81)
RDW: 12.9 % (ref 11.5–15.5)
WBC: 5.9 10*3/uL (ref 4.0–10.5)

## 2018-04-03 MED ORDER — MUPIROCIN 2 % EX OINT
TOPICAL_OINTMENT | Freq: Every day | CUTANEOUS | Status: DC
  Administered 2018-04-03 – 2018-04-04 (×2): via TOPICAL
  Administered 2018-04-05: 1 via TOPICAL

## 2018-04-03 MED ORDER — DIPHENHYDRAMINE HCL 25 MG PO CAPS
25.0000 mg | ORAL_CAPSULE | Freq: Four times a day (QID) | ORAL | Status: DC | PRN
  Administered 2018-04-03: 25 mg via ORAL
  Filled 2018-04-03: qty 1

## 2018-04-03 NOTE — Consult Note (Signed)
[place] Nurse wound consult note Reason for Consult:Cellulitis to right lower leg.  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 Calloused and scabbed Wound type: Infectious and neuropathic, stable Pressure Injury POA: Yes to right heel Measurement: right anterior lower leg:   Proximal- below knee:  3 cm x 2.6 cm scabbed lesion Distal:  1 cm x 0.4 cm scabbed lesion Right heel:  0.3 cm scabbed lesion with circumferential callous Wound SQZ:[id number] Drainage (amount, consistency, odor) Minimal purulence from lower leg wounds.  Right heel wound appears dry, patient reports some drainage at times.  Periwound:callous Right leg:  Erythema and Edema to right leg. Dressing procedure/placement/frequency:Cleanse right leg wound with NS and pat dry.  Apply mupirocin to leg wounds.  Cover with gauze and kerlix/tape.   Dry dressing to right heel wound.  Will not follow at this time.  Please re-consult if needed.  Domenic Moras RN BSN [place] Pager [phone]

## 2018-04-03 NOTE — Progress Notes (Signed)
[facility]                                                                              Patient Demographics  [name], is a 72 y.o. male, DOB - [birth year], EGB:[id number]  Admit date - 04/01/2018   Admitting Physician Jani Gravel, MD  Outpatient Primary MD for the patient is [name], DO  Outpatient specialists:   LOS - 0  days   Medical records reviewed and are as summarized below:    Chief Complaint  Patient presents with  . Leg Pain       Brief summary    Patient is a 72 year old male with diabetes type 2, hypertension, hyperlipidemia, CAD status post CABG, chronic pain, presented with right lower extremity swelling for the past 1 week worse than normal.  Patient had fallen on a concrete step and scraped his leg in two spots a week ago.  In the last 1 to 2 days, patient noticed redness of the right leg with swelling, no fevers or chills.  Assessment & Plan    Principal Problem:   Cellulitis of right lower extremity -Per patient he has chronic swelling in the right lower leg after CABG in 2017 -Doppler ultrasound of the right lower extremity showed no DVT or SVT --MRSA PCR positive, continue IV vancomycin and ceftriaxone -Wound care consult, continue Lasix -Improving today, much less erythema, tenderness, will continue antibiotics for 1 more day and transition to oral antibiotics in a.m., possible DC tomorrow.  Continue to elevate leg  Active Problems:  CAD status post CABG, PVD -Continue Plavix    Essential hypertension -BP stable, continue losartan   Diabetes mellitus type 2 -Placed on sliding scale insulin, Tradjenta -Hemoglobin A1c 6.6 on 03/21/2018 -CBGs controlled  Chronic pain syndrome -Continue gabapentin, Flexeril, Celebrex  Hyponatremia  -Serum osmolarity 269, urine osm 277, UNA 39 -IV fluids discontinued, sodium improving  Code Status: Full CODE STATUS DVT Prophylaxis: Heparin subcu Family Communication: Discussed in  detail with the patient, all imaging results, lab results explained to the patient    Disposition Plan:   Time Spent in minutes 25 minutes  Procedures:  None  Consultants:   None  Antimicrobials:   IV vancomycin 8/17  IV Rocephin   Medications  Scheduled Meds: . buPROPion  300 mg Oral BH-q7a  . celecoxib  100 mg Oral BID  . cholecalciferol  5,000 Units Oral Daily  . clonazePAM  1 mg Oral Daily  . clopidogrel  75 mg Oral Daily  . fentaNYL  50 mcg Transdermal Q72H  . furosemide  20 mg Oral BID  . gabapentin  800 mg Oral QHS  . heparin  5,000 Units Subcutaneous Q8H  . insulin aspart  0-5 Units Subcutaneous QHS  . insulin aspart  0-9 Units Subcutaneous TID WC  . linagliptin  5 mg Oral Daily  . losartan  25 mg Oral Daily  . multivitamins with iron  1 tablet Oral Daily  . mupirocin ointment  1 application Nasal BID  . pantoprazole  40 mg Oral Daily  . potassium chloride  10 mEq Oral BID  . rOPINIRole  2 mg  Oral QHS  . tamsulosin  0.4 mg Oral BID  . zonisamide  150 mg Oral QHS   Continuous Infusions: . cefTRIAXone (ROCEPHIN)  IV 2 g (04/02/18 2248)  . vancomycin 750 mg (04/03/18 0856)   PRN Meds:.acetaminophen **OR** acetaminophen, albuterol, cyclobenzaprine, HYDROmorphone   Antibiotics   Anti-infectives (From admission, onward)   Start     Dose/Rate Route Frequency Ordered Stop   04/02/18 0800  vancomycin (VANCOCIN) IVPB 750 mg/150 ml premix     750 mg 150 mL/hr over 60 Minutes Intravenous Every 12 hours 04/01/18 1725     04/01/18 2200  cefTRIAXone (ROCEPHIN) 2 g in sodium chloride 0.9 % 100 mL IVPB     2 g 200 mL/hr over 30 Minutes Intravenous Every 24 hours 04/01/18 1915     04/01/18 1730  vancomycin (VANCOCIN) 2,000 mg in sodium chloride 0.9 % 500 mL IVPB     2,000 mg 250 mL/hr over 120 Minutes Intravenous  Once 04/01/18 1725 04/01/18 1934   04/01/18 1715  piperacillin-tazobactam (ZOSYN) IVPB 3.375 g     3.375 g 100 mL/hr over 30 Minutes Intravenous   Once 04/01/18 1710 04/01/18 1843        Subjective:   [name] was seen and examined today.  Right leg feels much improved today, much less erythema, tenderness no fevers or chills.  Patient denies dizziness, chest pain, shortness of breath, abdominal pain, N/V/D/C, new weakness, numbess, tingling.   Objective:   Vitals:   04/02/18 0900 04/02/18 1926 04/03/18 0356 04/03/18 0500  BP: 128/62 138/68 120/71   Pulse: 82 67    Resp:      Temp: 98.6 F (37 C) 97.6 F (36.4 C) 97.6 F (36.4 C)   TempSrc: Oral Oral Axillary   SpO2: 100% 98% 96%   Weight:    98.1 kg  Height:        Intake/Output Summary (Last 24 hours) at 04/03/2018 1150 Last data filed at 04/03/2018 0826 Gross per 24 hour  Intake 480 ml  Output 3001 ml  Net -2521 ml     Wt Readings from Last 3 Encounters:  04/03/18 98.1 kg  03/28/18 100.9 kg  03/21/18 100.7 kg     Exam   General: Alert and oriented x 3, NAD  Eyes:   HEENT:  Atraumatic, normocephalic,   Cardiovascular: S1 S2 auscultated, . Regular rate and rhythm.  Respiratory: Clear to auscultation bilaterally, no wheezing, rales or rhonchi  Gastrointestinal: Soft, nontender, nondistended, + bowel sounds  Ext: 2+ edema RLE, left LE no edema  Neuro: no new deficits  Musculoskeletal: No digital cyanosis, clubbing  Skin: Right lower extremity, improving, no drainage, much less erythema and tenderness  Psych: Normal affect and demeanor, alert and oriented x3    Data Reviewed:  I have personally reviewed following labs and imaging studies  Micro Results Recent Results (from the past 240 hour(s))  MRSA PCR Screening     Status: Abnormal   Collection Time: 04/01/18 10:36 PM  Result Value Ref Range Status   MRSA by PCR POSITIVE (A) NEGATIVE Final    Comment: RESULT CALLED TO, READ BACK BY AND VERIFIED WITH: RN [name] [phone] @0154  [name] Performed at [facility], [address], [place], [place] [address]     Radiology  Reports Dg Chest 2 View  Result Date: 04/03/2018 CLINICAL DATA:  Hypoxia EXAM: CHEST - 2 VIEW COMPARISON:  01/17/2018 FINDINGS: There is a calcified right apical pulmonary nodule likely reflecting sequela prior  granulomatous disease. There is mild bilateral interstitial prominence. There is no focal consolidation. There is no pleural effusion or pneumothorax. The heart and mediastinal contours are unremarkable. There is evidence of prior CABG. No acute osseous abnormality. 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 IMPRESSION: No active cardiopulmonary disease. Electronically Signed   By: Kathreen Devoid   On: 04/03/2018 08:21   US Venous Img Lower Bilateral  Result Date: 03/07/2018 CLINICAL DATA:  Right lower leg swelling x2 years post vein harvest for CABG. Previous bilateral lower leg laser surgery. Recent right lower leg cellulitis. Left leg is asymptomatic. EXAM: BILATERAL LOWER EXTREMITY VENOUS DOPPLER ULTRASOUND TECHNIQUE: Gray-scale sonography with graded compression, as well as color Doppler and duplex ultrasound, were performed to evaluate the deep and superficial veins of both lower extremities. Spectral Doppler was utilized to evaluate flow at rest and with distal augmentation maneuvers. A complete right superficial venous insufficiency exam was performed in the upright standing position. I personally performed the technical portion of the exam. COMPARISON:  None. FINDINGS: Deep Venous System: Evaluation of the deep venous system including the common femoral, femoral, profunda femoral, popliteal and calf veins (where visible) demonstrate no evidence of deep venous thrombosis. The vessels are compressible and demonstrate normal respiratory phasicity and response to augmentation. No evidence of the deep venous reflux. Superficial Venous System: RIGHT SFJ: Patent.  No reflux post augmentation. GSV Prox Thigh: Normal in caliber, patent, no reflux post augmentation. GSV Mid Thigh: Not  visualized GSV Lower Thigh: Not visualized GSV at Knee: Not visualized GSV Prox Calf: Not visualized GSV Mid Calf: Not visualized GSV Distal Calf: Not visualized SPJ: Patent with greater than 6 seconds reflux post augmentation SSV Prox: Patent without convincing reflux post augmentation. Multiple branches. SSV Mid: Not visualized SSV Distal: Not visualized Other: Multiple dilated collateral venous channels from the short saphenous vein along the medial and anterior aspect of the knee and proximal calf. IMPRESSION: 1. Normal bilateral lower extremity deep venous systems. No evidence of acute or chronic DVT. 2. Nonvisualization of most of the right great saphenous vein consistent with surgical history of vein harvest. 3. Significant valvular incompetence and reflux in the proximal right short saphenous vein with communication to multiple superficial venous varicosities extending across the knee into the calf. Electronically Signed   By: Lucrezia Europe M.D.   On: 03/07/2018 10:19   [place] Rad Eval And Mgmt  Result Date: 03/07/2018 Please refer to "Notes" to see consult details.   Lab Data:  CBC: Recent Labs  Lab 04/01/18 1359 04/02/18 0252 04/03/18 0414  WBC 7.1 5.8 5.9  NEUTROABS 4.8  --   --   HGB 12.1* 11.4* 10.9*  HCT 37.5* 34.9* 33.3*  MCV 95.7 93.8 94.3  PLT 332 324 557   Basic Metabolic Panel: Recent Labs  Lab 04/01/18 1359 04/02/18 0252 04/03/18 0414  NA 131* 130* 132*  K 4.6 4.1 4.2  CL 95* 97* 98  CO2 26 22 26   GLUCOSE 125* 87 104*  BUN 13 13 7*  CREATININE 0.87 0.80 0.72  CALCIUM 8.9 8.0* 8.1*   GFR: Estimated Creatinine Clearance: 102.8 mL/min (by C-G formula based on SCr of 0.72 mg/dL). Liver Function Tests: Recent Labs  Lab 04/01/18 1359 04/02/18 0252  AST 22 18  ALT 15 13  ALKPHOS 95 81  BILITOT 0.7 0.8  PROT 7.2 6.2*  ALBUMIN 3.1* 2.6*   No results for input(s): LIPASE, AMYLASE in the last 168 hours. No results for input(s): AMMONIA in the  last 168  hours. Coagulation Profile: No results for input(s): INR, PROTIME in the last 168 hours. Cardiac Enzymes: Recent Labs  Lab 04/01/18 2154 04/02/18 0252 04/02/18 0903  TROPONINI <0.03 <0.03 <0.03   BNP (last 3 results) No results for input(s): PROBNP in the last 8760 hours. HbA1C: No results for input(s): HGBA1C in the last 72 hours. CBG: Recent Labs  Lab 04/02/18 1110 04/02/18 1626 04/02/18 2115 04/03/18 0639 04/03/18 1102  GLUCAP 123* 98 80 86 117*   Lipid Profile: No results for input(s): CHOL, HDL, LDLCALC, TRIG, CHOLHDL, LDLDIRECT in the last 72 hours. Thyroid Function Tests: No results for input(s): TSH, T4TOTAL, FREET4, T3FREE, THYROIDAB in the last 72 hours. Anemia Panel: No results for input(s): VITAMINB12, FOLATE, FERRITIN, TIBC, IRON, RETICCTPCT in the last 72 hours. Urine analysis:    Component Value Date/Time   COLORURINE YELLOW 12/11/2016 2313   APPEARANCEUR CLEAR 12/11/2016 2313   LABSPEC 1.005 12/11/2016 2313   PHURINE 7.0 12/11/2016 2313   GLUCOSEU NEGATIVE 12/11/2016 2313   GLUCOSEU NEGATIVE 11/15/2014 1003   HGBUR NEGATIVE 12/11/2016 2313   BILIRUBINUR NEGATIVE 12/11/2016 2313   KETONESUR NEGATIVE 12/11/2016 2313   PROTEINUR NEGATIVE 12/11/2016 2313   UROBILINOGEN 0.2 11/15/2014 1003   NITRITE NEGATIVE 12/11/2016 2313   LEUKOCYTESUR NEGATIVE 12/11/2016 2313       M.D. [facility] 04/03/2018, 11:50 AM  Pager: [phone] Between 7am to 7pm - call Pager - [phone]  After 7pm go to [email or web address] - password [email or web address]  Call night coverage person covering after 7pm

## 2018-04-04 ENCOUNTER — Inpatient Hospital Stay (HOSPITAL_COMMUNITY): Payer: Medicare Other

## 2018-04-04 DIAGNOSIS — K219 Gastro-esophageal reflux disease without esophagitis: Secondary | ICD-10-CM | POA: Diagnosis present

## 2018-04-04 DIAGNOSIS — I2510 Atherosclerotic heart disease of native coronary artery without angina pectoris: Secondary | ICD-10-CM | POA: Diagnosis present

## 2018-04-04 DIAGNOSIS — E785 Hyperlipidemia, unspecified: Secondary | ICD-10-CM | POA: Diagnosis present

## 2018-04-04 DIAGNOSIS — E43 Unspecified severe protein-calorie malnutrition: Secondary | ICD-10-CM | POA: Diagnosis present

## 2018-04-04 DIAGNOSIS — J449 Chronic obstructive pulmonary disease, unspecified: Secondary | ICD-10-CM | POA: Diagnosis present

## 2018-04-04 DIAGNOSIS — M810 Age-related osteoporosis without current pathological fracture: Secondary | ICD-10-CM | POA: Diagnosis present

## 2018-04-04 DIAGNOSIS — Z951 Presence of aortocoronary bypass graft: Secondary | ICD-10-CM | POA: Diagnosis not present

## 2018-04-04 DIAGNOSIS — N400 Enlarged prostate without lower urinary tract symptoms: Secondary | ICD-10-CM | POA: Diagnosis present

## 2018-04-04 DIAGNOSIS — I87311 Chronic venous hypertension (idiopathic) with ulcer of right lower extremity: Secondary | ICD-10-CM | POA: Diagnosis not present

## 2018-04-04 DIAGNOSIS — Z6829 Body mass index (BMI) 29.0-29.9, adult: Secondary | ICD-10-CM | POA: Diagnosis not present

## 2018-04-04 DIAGNOSIS — H5462 Unqualified visual loss, left eye, normal vision right eye: Secondary | ICD-10-CM | POA: Diagnosis present

## 2018-04-04 DIAGNOSIS — L03115 Cellulitis of right lower limb: Secondary | ICD-10-CM | POA: Diagnosis present

## 2018-04-04 DIAGNOSIS — Z880 Allergy status to penicillin: Secondary | ICD-10-CM | POA: Diagnosis not present

## 2018-04-04 DIAGNOSIS — I87309 Chronic venous hypertension (idiopathic) without complications of unspecified lower extremity: Secondary | ICD-10-CM | POA: Diagnosis present

## 2018-04-04 DIAGNOSIS — L97919 Non-pressure chronic ulcer of unspecified part of right lower leg with unspecified severity: Secondary | ICD-10-CM | POA: Diagnosis present

## 2018-04-04 DIAGNOSIS — G894 Chronic pain syndrome: Secondary | ICD-10-CM | POA: Diagnosis present

## 2018-04-04 DIAGNOSIS — I10 Essential (primary) hypertension: Secondary | ICD-10-CM | POA: Diagnosis present

## 2018-04-04 DIAGNOSIS — Z882 Allergy status to sulfonamides status: Secondary | ICD-10-CM | POA: Diagnosis not present

## 2018-04-04 DIAGNOSIS — L02415 Cutaneous abscess of right lower limb: Secondary | ICD-10-CM | POA: Diagnosis present

## 2018-04-04 DIAGNOSIS — E1151 Type 2 diabetes mellitus with diabetic peripheral angiopathy without gangrene: Secondary | ICD-10-CM | POA: Diagnosis present

## 2018-04-04 DIAGNOSIS — Z91041 Radiographic dye allergy status: Secondary | ICD-10-CM | POA: Diagnosis not present

## 2018-04-04 DIAGNOSIS — D649 Anemia, unspecified: Secondary | ICD-10-CM | POA: Diagnosis not present

## 2018-04-04 DIAGNOSIS — E1142 Type 2 diabetes mellitus with diabetic polyneuropathy: Secondary | ICD-10-CM | POA: Diagnosis present

## 2018-04-04 DIAGNOSIS — E871 Hypo-osmolality and hyponatremia: Secondary | ICD-10-CM | POA: Diagnosis present

## 2018-04-04 DIAGNOSIS — Z888 Allergy status to other drugs, medicaments and biological substances status: Secondary | ICD-10-CM | POA: Diagnosis not present

## 2018-04-04 DIAGNOSIS — D638 Anemia in other chronic diseases classified elsewhere: Secondary | ICD-10-CM | POA: Diagnosis present

## 2018-04-04 LAB — BASIC METABOLIC PANEL
Anion gap: 7 (ref 5–15)
BUN: 8 mg/dL (ref 8–23)
CALCIUM: 8.3 mg/dL — AB (ref 8.9–10.3)
CHLORIDE: 96 mmol/L — AB (ref 98–111)
CO2: 28 mmol/L (ref 22–32)
CREATININE: 0.83 mg/dL (ref 0.61–1.24)
Glucose, Bld: 90 mg/dL (ref 70–99)
Potassium: 3.9 mmol/L (ref 3.5–5.1)
SODIUM: 131 mmol/L — AB (ref 135–145)

## 2018-04-04 LAB — GLUCOSE, CAPILLARY
GLUCOSE-CAPILLARY: 89 mg/dL (ref 70–99)
Glucose-Capillary: 126 mg/dL — ABNORMAL HIGH (ref 70–99)
Glucose-Capillary: 97 mg/dL (ref 70–99)
Glucose-Capillary: 118 mg/dL — ABNORMAL HIGH (ref 70–99)

## 2018-04-04 MED ORDER — FUROSEMIDE 10 MG/ML IJ SOLN
40.0000 mg | Freq: Once | INTRAMUSCULAR | Status: AC
  Administered 2018-04-04: 40 mg via INTRAVENOUS
  Filled 2018-04-04: qty 4

## 2018-04-04 NOTE — Progress Notes (Addendum)
[facility]                                                                              Patient Demographics  [name], is a 72 y.o. male, DOB - 04-30-1946, EQA:[id number]  Admit date - 04/01/2018   Admitting Physician Anthony Gravel, MD  Outpatient Primary MD for the patient is Anthony Cranker, DO  Outpatient specialists:   LOS - 0  days   Medical records reviewed and are as summarized below:    Chief Complaint  Patient presents with  . Leg Pain       Brief summary    Patient is a 72 year old male with diabetes type 2, hypertension, hyperlipidemia, CAD status post CABG, chronic pain, presented with right lower extremity swelling for the past 1 week worse than normal.  Patient had fallen on a concrete step and scraped his leg in two spots a week ago.  In the last 1 to 2 days, patient noticed redness of the right leg with swelling, no fevers or chills.  Assessment & Plan    Principal Problem:   Cellulitis of right lower extremity -Per patient he has chronic swelling in the right lower leg after CABG in 2017 -Doppler ultrasound of the right lower extremity showed no DVT or SVT --MRSA PCR positive -Wound care consulted -Per patient, he hit his right leg again last night with a table on ambulation, now 1 of the wound is weeping.   -will give Lasix 40 mg IV x1 and continue oral Lasix -Obtain CT of the right lower extremity, continue IV vancomycin, ceftriaxone Addendum 4:55PM  CT of the right lower extremity reviewed, with flocculent area 3x2x1 cm.  Discussed with orthopedics, [name].  Recommended keep the patient n.p.o. after midnight and [name] will evaluate in a.m.  Active Problems:  CAD status post CABG, PVD -Continue Plavix    Essential hypertension -BP stable, continue losartan, Lasix   Diabetes mellitus type 2 -Hemoglobin A1c 6.6 on 03/21/2018 -CBG stable, continue sliding scale insulin, Tradjenta  Chronic pain syndrome -Continue gabapentin,  Flexeril, Celebrex  Hyponatremia  -Serum osmolarity 269, urine osm 277, UNA 39 -IV fluids discontinued, sodium stable, appears to be his baseline  Code Status: Full CODE STATUS DVT Prophylaxis: Heparin subcu Family Communication: Discussed in detail with the patient, all imaging results, lab results explained to the patient    Disposition Plan:   Time Spent in minutes 25 minutes  Procedures:  None  Consultants:   None  Antimicrobials:   IV vancomycin 8/17  IV Rocephin   Medications  Scheduled Meds: . buPROPion  300 mg Oral BH-q7a  . celecoxib  100 mg Oral BID  . cholecalciferol  5,000 Units Oral Daily  . clonazePAM  1 mg Oral Daily  . clopidogrel  75 mg Oral Daily  . fentaNYL  50 mcg Transdermal Q72H  . furosemide  20 mg Oral BID  . gabapentin  800 mg Oral QHS  . heparin  5,000 Units Subcutaneous Q8H  . insulin aspart  0-5 Units Subcutaneous QHS  . insulin aspart  0-9 Units Subcutaneous TID WC  . linagliptin  5 mg Oral  Daily  . losartan  25 mg Oral Daily  . multivitamins with iron  1 tablet Oral Daily  . mupirocin ointment  1 application Nasal BID  . mupirocin ointment   Topical Daily  . pantoprazole  40 mg Oral Daily  . potassium chloride  10 mEq Oral BID  . rOPINIRole  2 mg Oral QHS  . tamsulosin  0.4 mg Oral BID  . zonisamide  150 mg Oral QHS   Continuous Infusions: . cefTRIAXone (ROCEPHIN)  IV Stopped (04/03/18 2230)  . vancomycin 750 mg (04/04/18 1040)   PRN Meds:.acetaminophen **OR** acetaminophen, albuterol, cyclobenzaprine, diphenhydrAMINE, HYDROmorphone   Antibiotics   Anti-infectives (From admission, onward)   Start     Dose/Rate Route Frequency Ordered Stop   04/02/18 0800  vancomycin (VANCOCIN) IVPB 750 mg/150 ml premix     750 mg 150 mL/hr over 60 Minutes Intravenous Every 12 hours 04/01/18 1725     04/01/18 2200  cefTRIAXone (ROCEPHIN) 2 g in sodium chloride 0.9 % 100 mL IVPB     2 g 200 mL/hr over 30 Minutes Intravenous Every 24  hours 04/01/18 1915     04/01/18 1730  vancomycin (VANCOCIN) 2,000 mg in sodium chloride 0.9 % 500 mL IVPB     2,000 mg 250 mL/hr over 120 Minutes Intravenous  Once 04/01/18 1725 04/01/18 1934   04/01/18 1715  piperacillin-tazobactam (ZOSYN) IVPB 3.375 g     3.375 g 100 mL/hr over 30 Minutes Intravenous  Once 04/01/18 1710 04/01/18 1843        Subjective:   [name] was seen and examined today.  States he hit his right leg with the tray table last night, since then the right leg feels more painful and weeping from 1 of the wounds.  Still has erythema, no fevers or chills.  Patient denies dizziness, chest pain, shortness of breath, abdominal pain, N/V/D.   Objective:   Vitals:   04/04/18 1000 04/04/18 1100 04/04/18 1300 04/04/18 1321  BP:    92/72  Pulse: 82   82  Resp:  17 19 20   Temp: 97.6 F (36.4 C)   98.2 F (36.8 C)  TempSrc: Oral   Oral  SpO2: (!) 22%   95%  Weight:      Height:        Intake/Output Summary (Last 24 hours) at 04/04/2018 1514 Last data filed at 04/04/2018 1300 Gross per 24 hour  Intake 1640 ml  Output 2730 ml  Net -1090 ml     Wt Readings from Last 3 Encounters:  04/04/18 97.9 kg  03/28/18 100.9 kg  03/21/18 100.7 kg     Exam  General: Alert and oriented x 3, NAD Eyes:  HEENT:  Atraumatic, normocephalic Cardiovascular: S1 S2 auscultated,  Regular rate and rhythm. No pedal edema b/l Respiratory: Clear to auscultation bilaterally, no wheezing, rales or rhonchi Gastrointestinal: Soft, nontender, nondistended, + bowel sounds Ext: RLE edema 2+, LLE no edema  Neuro: no new FND Musculoskeletal: No digital cyanosis, clubbing Skin: wound from RLE now weeping, erythema with swelling Psych: Normal affect and demeanor, alert and oriented x3      Data Reviewed:  I have personally reviewed following labs and imaging studies  Micro Results Recent Results (from the past 240 hour(s))  MRSA PCR Screening     Status: Abnormal   Collection  Time: 04/01/18 10:36 PM  Result Value Ref Range Status   MRSA by PCR POSITIVE (A) NEGATIVE Final    Comment: RESULT CALLED TO,  READ BACK BY AND VERIFIED WITH: RN [name] [id number] @[id number]  [name] Performed at [facility], [place] [address], [place], [place] [address]     Radiology Reports Dg Chest 2 View  Result Date: 04/03/2018 CLINICAL DATA:  Hypoxia EXAM: CHEST - 2 VIEW COMPARISON:  01/17/2018 FINDINGS: There is a calcified right apical pulmonary nodule likely reflecting sequela prior granulomatous disease. There is mild bilateral interstitial prominence. There is no focal consolidation. There is no pleural effusion or pneumothorax. The heart and mediastinal contours are unremarkable. There is evidence of prior CABG. No acute osseous abnormality. 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 IMPRESSION: No active cardiopulmonary disease. Electronically Signed   By: Kathreen Devoid   On: 04/03/2018 08:21   US Venous Img Lower Bilateral  Result Date: 03/07/2018 CLINICAL DATA:  Right lower leg swelling x2 years post vein harvest for CABG. Previous bilateral lower leg laser surgery. Recent right lower leg cellulitis. Left leg is asymptomatic. EXAM: BILATERAL LOWER EXTREMITY VENOUS DOPPLER ULTRASOUND TECHNIQUE: Gray-scale sonography with graded compression, as well as color Doppler and duplex ultrasound, were performed to evaluate the deep and superficial veins of both lower extremities. Spectral Doppler was utilized to evaluate flow at rest and with distal augmentation maneuvers. A complete right superficial venous insufficiency exam was performed in the upright standing position. I personally performed the technical portion of the exam. COMPARISON:  None. FINDINGS: Deep Venous System: Evaluation of the deep venous system including the common femoral, femoral, profunda femoral, popliteal and calf veins (where visible) demonstrate no evidence of deep venous thrombosis. The vessels are  compressible and demonstrate normal respiratory phasicity and response to augmentation. No evidence of the deep venous reflux. Superficial Venous System: RIGHT SFJ: Patent.  No reflux post augmentation. GSV Prox Thigh: Normal in caliber, patent, no reflux post augmentation. GSV Mid Thigh: Not visualized GSV Lower Thigh: Not visualized GSV at Knee: Not visualized GSV Prox Calf: Not visualized GSV Mid Calf: Not visualized GSV Distal Calf: Not visualized SPJ: Patent with greater than 6 seconds reflux post augmentation SSV Prox: Patent without convincing reflux post augmentation. Multiple branches. SSV Mid: Not visualized SSV Distal: Not visualized Other: Multiple dilated collateral venous channels from the short saphenous vein along the medial and anterior aspect of the knee and proximal calf. IMPRESSION: 1. Normal bilateral lower extremity deep venous systems. No evidence of acute or chronic DVT. 2. Nonvisualization of most of the right great saphenous vein consistent with surgical history of vein harvest. 3. Significant valvular incompetence and reflux in the proximal right short saphenous vein with communication to multiple superficial venous varicosities extending across the knee into the calf. Electronically Signed   By: Lucrezia Europe M.D.   On: 03/07/2018 10:19   [place] Rad Eval And Mgmt  Result Date: 03/07/2018 Please refer to "Notes" to see consult details.   Lab Data:  CBC: Recent Labs  Lab 04/01/18 1359 04/02/18 0252 04/03/18 0414  WBC 7.1 5.8 5.9  NEUTROABS 4.8  --   --   HGB 12.1* 11.4* 10.9*  HCT 37.5* 34.9* 33.3*  MCV 95.7 93.8 94.3  PLT 332 324 314   Basic Metabolic Panel: Recent Labs  Lab 04/01/18 1359 04/02/18 0252 04/03/18 0414 04/04/18 0301  NA 131* 130* 132* 131*  K 4.6 4.1 4.2 3.9  CL 95* 97* 98 96*  CO2 26 22 26 28   GLUCOSE 125* 87 104* 90  BUN 13 13 7* 8  CREATININE 0.87 0.80 0.72 0.83  CALCIUM 8.9 8.0*  8.1* 8.3*   GFR: Estimated Creatinine Clearance: 99 mL/min (by  C-G formula based on SCr of 0.83 mg/dL). Liver Function Tests: Recent Labs  Lab 04/01/18 1359 04/02/18 0252  AST 22 18  ALT 15 13  ALKPHOS 95 81  BILITOT 0.7 0.8  PROT 7.2 6.2*  ALBUMIN 3.1* 2.6*   No results for input(s): LIPASE, AMYLASE in the last 168 hours. No results for input(s): AMMONIA in the last 168 hours. Coagulation Profile: No results for input(s): INR, PROTIME in the last 168 hours. Cardiac Enzymes: Recent Labs  Lab 04/01/18 2154 04/02/18 0252 04/02/18 0903  TROPONINI <0.03 <0.03 <0.03   BNP (last 3 results) No results for input(s): PROBNP in the last 8760 hours. HbA1C: No results for input(s): HGBA1C in the last 72 hours. CBG: Recent Labs  Lab 04/03/18 1102 04/03/18 1610 04/03/18 2125 04/04/18 0615 04/04/18 1106  GLUCAP 117* 117* 136* 89 126*   Lipid Profile: No results for input(s): CHOL, HDL, LDLCALC, TRIG, CHOLHDL, LDLDIRECT in the last 72 hours. Thyroid Function Tests: No results for input(s): TSH, T4TOTAL, FREET4, T3FREE, THYROIDAB in the last 72 hours. Anemia Panel: No results for input(s): VITAMINB12, FOLATE, FERRITIN, TIBC, IRON, RETICCTPCT in the last 72 hours. Urine analysis:    Component Value Date/Time   COLORURINE YELLOW 12/11/2016 2313   APPEARANCEUR CLEAR 12/11/2016 2313   LABSPEC 1.005 12/11/2016 2313   PHURINE 7.0 12/11/2016 2313   GLUCOSEU NEGATIVE 12/11/2016 2313   GLUCOSEU NEGATIVE 11/15/2014 1003   HGBUR NEGATIVE 12/11/2016 2313   BILIRUBINUR NEGATIVE 12/11/2016 2313   KETONESUR NEGATIVE 12/11/2016 2313   PROTEINUR NEGATIVE 12/11/2016 2313   UROBILINOGEN 0.2 11/15/2014 1003   NITRITE NEGATIVE 12/11/2016 2313   LEUKOCYTESUR NEGATIVE 12/11/2016 2313     Darnesha Diloreto M.D. [facility] 04/04/2018, 3:14 PM  Pager: [phone] Between 7am to 7pm - call Pager - [phone]  After 7pm go to [email or web address] - password [email or web address]  Call night coverage person covering after 7pm  1

## 2018-04-04 NOTE — Progress Notes (Signed)
Pharmacy Antibiotic Note  [name] is a 72 y.o. male admitted on 04/01/2018 with RLE swelling and concern for cellulitis. Dopplers negative for DVT. Pharmacy has been consulted for Vancomycin dosing. On D#4 of vancomycin. WBC wnl. SCr wnl and stable   Plan: - Continue vancomycin 750 mg IV every 12 hours - Rocephin 2g IV every 24 hours - Likely transition to oral abx tomorrow per MD   Height: 6' (182.9 cm) Weight: 215 lb 13.3 oz (97.9 kg) IBW/kg (Calculated) : 77.6  Temp (24hrs), Avg:98.3 F (36.8 C), Min:97.6 F (36.4 C), Max:98.9 F (37.2 C)  Recent Labs  Lab 04/01/18 1359 04/01/18 1410 04/02/18 0252 04/03/18 0414 04/04/18 0301  WBC 7.1  --  5.8 5.9  --   CREATININE 0.87  --  0.80 0.72 0.83  LATICACIDVEN  --  1.02  --   --   --     Estimated Creatinine Clearance: 99 mL/min (by C-G formula based on SCr of 0.83 mg/dL).    Allergies  Allergen Reactions  . Ivp Dye [Iodinated Diagnostic Agents]     Blood Pressure dropped----pt was pre-medicated with 13 hour prep and did fine with pre-meds--amy 03/09/11   . Penicillins Nausea Only    REACTION: gi upset Has patient had a PCN reaction causing immediate rash, facial/tongue/throat swelling, SOB or lightheadedness with hypotension: No Has patient had a PCN reaction causing severe rash involving mucus membranes or skin necrosis: No Has patient had a PCN reaction that required hospitalization: No Has patient had a PCN reaction occurring within the last 10 years: No If all of the above answers are "NO", then may proceed with Cephalosporin use.   . Sulfa Antibiotics Hives  . Sulfonamide Derivatives Hives and Itching  . Lisinopril Cough  . Statins Other (See Comments)    myalgias  . Propoxyphene N-Acetaminophen Other (See Comments)    Sharp pains- headache    Albertina Parr, PharmD., BCPS Clinical Pharmacist Clinical phone for 04/04/18 until 3:30pm: [phone] If after 3:30pm, please refer to [facility] for unit-specific  pharmacist

## 2018-04-05 ENCOUNTER — Encounter: Payer: Self-pay | Admitting: Internal Medicine

## 2018-04-05 DIAGNOSIS — L03115 Cellulitis of right lower limb: Principal | ICD-10-CM

## 2018-04-05 DIAGNOSIS — I87311 Chronic venous hypertension (idiopathic) with ulcer of right lower extremity: Secondary | ICD-10-CM

## 2018-04-05 DIAGNOSIS — E43 Unspecified severe protein-calorie malnutrition: Secondary | ICD-10-CM

## 2018-04-05 DIAGNOSIS — E1142 Type 2 diabetes mellitus with diabetic polyneuropathy: Secondary | ICD-10-CM

## 2018-04-05 DIAGNOSIS — L97919 Non-pressure chronic ulcer of unspecified part of right lower leg with unspecified severity: Secondary | ICD-10-CM

## 2018-04-05 LAB — GLUCOSE, CAPILLARY
GLUCOSE-CAPILLARY: 107 mg/dL — AB (ref 70–99)
Glucose-Capillary: 120 mg/dL — ABNORMAL HIGH (ref 70–99)
Glucose-Capillary: 101 mg/dL — ABNORMAL HIGH (ref 70–99)
Glucose-Capillary: 99 mg/dL (ref 70–99)

## 2018-04-05 LAB — BASIC METABOLIC PANEL
ANION GAP: 9 (ref 5–15)
BUN: 9 mg/dL (ref 8–23)
CALCIUM: 8.1 mg/dL — AB (ref 8.9–10.3)
CO2: 27 mmol/L (ref 22–32)
CREATININE: 0.76 mg/dL (ref 0.61–1.24)
Chloride: 93 mmol/L — ABNORMAL LOW (ref 98–111)
GFR calc Af Amer: >60 mL/min (ref >60)
GLUCOSE: 126 mg/dL — AB (ref 70–99)
Potassium: 4.4 mmol/L (ref 3.5–5.1)
Sodium: 129 mmol/L — ABNORMAL LOW (ref 135–145)

## 2018-04-05 LAB — CBC
HCT: 34.5 % — ABNORMAL LOW (ref 39.0–52.0)
HEMOGLOBIN: 11.4 g/dL — AB (ref 13.0–17.0)
MCH: 30.8 pg (ref 26.0–34.0)
MCHC: 33 g/dL (ref 30.0–36.0)
MCV: 93.2 fL (ref 78.0–100.0)
PLATELETS: 351 10*3/uL (ref 150–400)
RBC: 3.7 MIL/uL — ABNORMAL LOW (ref 4.22–5.81)
RDW: 12.5 % (ref 11.5–15.5)
WBC: 7.7 10*3/uL (ref 4.0–10.5)

## 2018-04-05 MED ORDER — GLUCERNA SHAKE PO LIQD
237.0000 mL | Freq: Three times a day (TID) | ORAL | Status: DC
  Administered 2018-04-05 – 2018-04-07 (×4): 237 mL via ORAL

## 2018-04-05 MED ORDER — PRO-STAT SUGAR FREE PO LIQD
30.0000 mL | Freq: Every day | ORAL | Status: DC
  Administered 2018-04-05 – 2018-04-06 (×2): 30 mL via ORAL
  Filled 2018-04-05 (×2): qty 30

## 2018-04-05 NOTE — Care Management Note (Signed)
Case Management Note  Patient Details  Name: [name] MRN: [id number] Date of Birth: 1945-12-25  Subjective/Objective:                 Cellulitis Right lower leg   Action/Plan:  Spoke w patient at bedside. He provides that he lives at home, independently PTA. He states that he does not have any family closer than [place]. Discussed his pan for wound management and he is agreeable for a [facility] RN. Provided options and he would like to use [facility].  He states that he lives in a one story house and has a cane. He provides that he has insurance through [employer] through [employer] order and referral called in to [facility].   Expected Discharge Date:                  Expected Discharge Plan:  [place]  In-House Referral:     Discharge planning Services  CM Consult  Post Acute Care Choice:    Choice offered to:     DME Arranged:    DME Agency:     HH Arranged:    [place] Agency:  [place]  Status of Service:     If discussed at [place] of Stay Meetings, dates discussed:    Additional Comments:  Carles Collet, RN 04/05/2018, 11:38 AM

## 2018-04-05 NOTE — Progress Notes (Signed)
Pt very clumsy and cannot stay still. Pt educated that he cannot get up by himself due to falling at home.   Pt stated " I don't undersatand why I cannot get up by myself!"    Bed alarm applied pt in bed with call bell within reach. Will continue to monitor.  Chauncey Reading

## 2018-04-05 NOTE — Consult Note (Signed)
[place] Nurse wound consult note Evaluation completed and [place] care provided in [employer]. Reason for Consult: Profore 4 layer compression wrap placed on right lower leg. Wound type: Venous insufficiency per Dr. Jess Barters note dated today. Measurements:  Three distinct areas are present.  One just below the knee and it measures 0.6 cm x 0.6 cm x unknown depth due to slough covering wound bed.  There are two close in proximity along the tibia.  One measures 1.2 cm x 1.2 cm x unknown depth due to slough on wound bed.  The last one measures 0.8 cm x 0.6cm x unknown depth, also due to slough covering the wound bed. Drainage (amount, consistency, odor) Moderate yellow drainage on existing gauze and kerlex dressing.  Periwound: Each wound is surrounded by erythema, slight induration, and the leg is edematous. Dressing procedure/placement/frequency: Four layer compression wrap.   Monitor the wound area(s) for worsening of condition such as: Signs/symptoms of infection,  Increase in size,  Development of or worsening of odor, Development of pain, or increased pain at the affected locations.  Notify the medical team if any of these develop.  Thank you for the consult.  Discussed plan of care with the patient and bedside nurse.   Val Riles, RN, MSN, CWOCN, CNS-BC, pager [phone]

## 2018-04-05 NOTE — Progress Notes (Signed)
Initial Nutrition Assessment  DOCUMENTATION CODES:   Not applicable  INTERVENTION:    Glucerna Shake po TID, each supplement provides 220 kcal and 10 grams of protein  Prostat liquid protein po 30 ml daily, each supplement provides 100 kcal, 15 grams protein  NUTRITION DIAGNOSIS:   Increased nutrient needs related to acute illness(cellulitis) as evidenced by estimated needs.  GOAL:   Patient will meet greater than or equal to 90% of their needs  MONITOR:   PO intake, Supplement acceptance, Labs, Weight trends, Skin, I & O's  REASON FOR ASSESSMENT:   Consult Assessment of nutrition requirement/status  ASSESSMENT:   [name] with type 2 DM, HTN, CAD status post CABG, chronic pain, presented with right lower extremity swelling for the past 1 week worse than normal.   Pt admitted with R lower extremity cellulitis. He is sleeping upon RD visit. Legs are hanging off his bed. On HH/Carbohydrate Modified diet. PO intake 50% per flowsheet records.  Medications include Vitamin D, MVI with iron and Lasix. Labs reviewed. Na 129 (L). CBG's [id number]. CWOCN note 8/21 reviewed.  Pt would benefit from oral nutrition supplements. RD to order.  NUTRITION - FOCUSED PHYSICAL EXAM:  Unable to complete at this time.  Diet Order:   Diet Order            Diet heart healthy/carb modified Room service appropriate? Yes; Fluid consistency: Thin  Diet effective now             EDUCATION NEEDS:   Not appropriate for education at this time  Skin:  Skin Assessment: Skin Integrity Issues: Skin Integrity Issues:: Other (Comment) Other: full thickness to R heel  Last BM:  8/17  Height:   Ht Readings from Last 1 Encounters:  04/01/18 6' (1.829 m)   Weight:   Wt Readings from Last 1 Encounters:  04/05/18 97.3 kg   Ideal Body Weight:  81 kg  BMI:  Body mass index is 29.09 kg/m.  Estimated Nutritional Needs:   Kcal:  1700-1900  Protein:  95-110 gm  Fluid:  1.7-1.9  L  [name], RD, LDN Pager #: [phone] After-Hours Pager #: [phone]

## 2018-04-05 NOTE — Consult Note (Signed)
ORTHOPAEDIC CONSULTATION  REQUESTING PHYSICIAN: Modena Jansky, MD  Chief Complaint: Cellulitis and swelling right lower extremity.  HPI: [name] is a 72 y.o. male who presents with cellulitis and swelling right lower extremity patient has diabetic insensate neuropathy severe protein caloric malnutrition with recent falls in the right lower extremities with 2 contusions 1 over mid tibia 1 over the tibial tubercle.  Patient has cellulitis and swelling of the entire right lower extremity.  Patient is status post aortoiliac stenting with vascular vein surgery he has a follow-up with them this week.    Past Medical History:  Diagnosis Date  . Anemia    NOS  . Anxiety   . 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 BPH (benign prostatic hypertrophy)   . Broken foot Oct. 12, 2013   Right foot Fx  . CAD (coronary artery disease)   . Cellulitis and abscess of leg 03/2018   right leg  . Chronic hyponatremia   . Chronic pain syndrome   . COPD (chronic obstructive pulmonary disease) ([place])   . Depression   . Diabetes mellitus   . Diabetic foot ulcer ([place])   . DJD (degenerative joint disease)    L wrist  . DM2 (diabetes mellitus, type 2) ([place])   . GERD (gastroesophageal reflux disease)   . HLD (hyperlipidemia)   . HTN (hypertension)   . Lumbar disc disease   . Osteoporosis    tx per [facility], Dr Prudencio Burly, thought due to heavy steriod use after 1978  . Spine fracture    hx, multiple  . Varicose veins    Past Surgical History:  Procedure Laterality Date  . CARDIAC CATHETERIZATION N/A 09/19/2015   Procedure: Left Heart Cath and Coronary Angiography;  Surgeon: Adrian Prows, MD;  Location: [facility];  Service: Cardiovascular;  Laterality: N/A;  . CARPAL TUNNEL RELEASE    . CORONARY ARTERY BYPASS GRAFT N/A 09/22/2015   Procedure: CORONARY ARTERY BYPASS GRAFTING (CABG) times four using the right greater saphenous vein harvested endoscopically and the left internal  mammary artery.  LIMA-LAD, SEQ SVG-DIAG & OM, SVG-PD.;  Surgeon: [name], MD;  Location: [facility];  Service: Open Heart Surgery;  Laterality: N/A;  . EYE SURGERY    . L foot open repair jones fracture  2010    5th metetarsal   . left ankle ganglion cyst  1976  . left cataract  1996   right - 1997  . left CTS  2006   R CTS - 2007  . left foot surgery  1998   R surgery/fracture - 1999  . left wrist/hand fusion  2008  . [place], 2004  . NASAL SINUS SURGERY     multiple- x8. last 1997 with obliteration  . REPAIR THORACIC AORTA  2017  . right hand fracture  1969  . ROTATOR CUFF REPAIR  2006   R, than repeat 2011, Dr Theda Sers  . TRIGGER FINGER RELEASE Left 11/11/2014   Procedure: LEFT LONG FINGER RELEASE TRIGGER FINGER/A-1 PULLEY;  Surgeon: Milly Jakob, MD;  Location: [facility];  Service: Orthopedics;  Laterality: Left;  . vocal surgery  1996   Social History   Socioeconomic History  . Marital status: Divorced    Spouse name: Not on file  . Number of children: Not on file  . Years of education: Not on file  . Highest education level: Not on file  Occupational  History  . Not on file  Social Needs  . Financial resource strain: Not hard at all  . Food insecurity:    Worry: Never true    Inability: Never true  . Transportation needs:    Medical: No    Non-medical: No  Tobacco Use  . Smoking status: Former Smoker    Packs/day: 1.50    Years: 30.00    Pack years: 45.00    Types: Cigarettes    Last attempt to quit: 11/06/1988    Years since quitting: 29.4  . Smokeless tobacco: Never Used  Substance and Sexual Activity  . Alcohol use: Not Currently    Comment: None in 30 years but drank beer before  . Drug use: No  . Sexual activity: Not Currently  Lifestyle  . Physical activity:    Days per week: 0 days    Minutes per session: 0 min  . Stress: Rather much  Relationships  . Social connections:    Talks on phone: Never    Gets  together: More than three times a week    Attends religious service: Never    Active member of club or organization: No    Attends meetings of clubs or organizations: Never    Relationship status: Divorced  Other Topics Concern  . Not on file  Social History Narrative   Social History      Diet? I eat about what I want.       Do you drink/eat things with caffeine? yes      Marital status?                  divorced                  What year were you married? 1966 to 2007      Do you live in a house, apartment, assisted living, condo, trailer, etc.? [place]      Is it one or more stories? one      How many persons live in your home? Me only      Do you have any pets in your home? (please list) no      Highest level of education completed? 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               no                       Type & how often?      Advanced Directives      Do you have a living will? no      Do you have a DNR form?     no                             If not, do you want to discuss one?      Do you have signed POA/HPOA for forms? No      Functional Status      Do you have difficulty bathing or dressing yourself? No- shower      Do you have difficulty preparing food or eating? No- Don't want to      Do you have difficulty managing your medications?      Do you have difficulty managing your finances?      Do you  have difficulty affording your medications? No- paid for by [employer]   Family History  Problem Relation Age of Onset  . Lung cancer Father 84  . Diabetes Mother   . Osteoporosis Mother   . Throat cancer Unknown        family hx - "bad living" - also lung CA, heart disease and renal failure    . Diabetes Sister   . Suicidality Son 42  . Lung cancer Maternal Grandmother   . Diabetes Maternal Grandfather   . Heart attack Paternal Grandmother    - negative except otherwise  stated in the family history section Allergies  Allergen Reactions  . Ivp Dye [Iodinated Diagnostic Agents]     Blood Pressure dropped----pt was pre-medicated with 13 hour prep and did fine with pre-meds--amy 03/09/11   . Penicillins Nausea Only    REACTION: gi upset Has patient had a PCN reaction causing immediate rash, facial/tongue/throat swelling, SOB or lightheadedness with hypotension: No Has patient had a PCN reaction causing severe rash involving mucus membranes or skin necrosis: No Has patient had a PCN reaction that required hospitalization: No Has patient had a PCN reaction occurring within the last 10 years: No If all of the above answers are "NO", then may proceed with Cephalosporin use.   . Sulfa Antibiotics Hives  . Sulfonamide Derivatives Hives and Itching  . Lisinopril Cough  . Statins Other (See Comments)    myalgias  . Propoxyphene N-Acetaminophen Other (See Comments)    Sharp pains- headache   Prior to Admission medications   Medication Sig Start Date End Date Taking? Authorizing Provider  buPROPion (WELLBUTRIN XL) 300 MG 24 hr tablet Take 300 mg by mouth every morning.  10/12/11  Yes [provider]  celecoxib (CELEBREX) 200 MG capsule TAKE 1 CAPSULE(200 MG) BY MOUTH TWICE DAILY Patient taking differently: Take 200 mg by mouth 2 (two) times daily.  03/20/18  Yes [name], DO  Cholecalciferol (VITAMIN D) 1000 UNITS capsule Take 5,000 Units by mouth daily.    Yes [provider]  clonazePAM (KLONOPIN) 0.5 MG tablet Take 1 mg by mouth daily at 12 noon.    Yes [provider]  clopidogrel (PLAVIX) 75 MG tablet Take 75 mg by mouth daily.   Yes [provider]  cyclobenzaprine (FLEXERIL) 10 MG tablet Take 10 mg by mouth 3 (three) times daily as needed for muscle spasms.  05/05/17  Yes [provider]  desonide (DESOWEN) 0.05 % cream Apply 1 application topically daily as needed ([place]).   Yes [provider]    fentaNYL (DURAGESIC - DOSED MCG/HR) 50 MCG/HR Place 50 mcg onto the skin every 3 (three) days.   Yes [provider]  furosemide (LASIX) 20 MG tablet Take 20 mg by mouth 2 (two) times daily.   Yes [provider]  gabapentin (NEURONTIN) 400 MG capsule Take 800 mg by mouth at bedtime.    Yes [provider]  HYDROmorphone (DILAUDID) 2 MG tablet Take 2 mg by mouth every 6 (six) hours as needed for severe pain.    Yes [provider]  JANUVIA 100 MG tablet TAKE 1 TABLET BY MOUTH EVERY DAY 02/20/18  Yes Eulas Post, Monica, DO  losartan (COZAAR) 25 MG tablet Take 25 mg by mouth daily.   Yes [provider]  Multiple Vitamins-Iron (MULTIVITAMINS WITH IRON) TABS tablet Take 1 tablet by mouth daily.   Yes [provider]  mupirocin ointment (BACTROBAN) 2 % Place 1 application  into the nose 2 (two) times daily. For nasal infection x 7 days 03/28/18  Yes Eulas Post, Monica, DO  nitroGLYCERIN (NITROSTAT) 0.4 MG SL tablet Place 0.4 mg under the tongue every 5 (five) minutes as needed for chest pain.   Yes [provider]  omeprazole (PRILOSEC) 40 MG capsule Take 1 capsule (40 mg total) by mouth daily. Patient taking differently: Take 40 mg by mouth 2 (two) times daily.  12/21/17  Yes Eulas Post, Monica, DO  potassium chloride (K-DUR) 10 MEQ tablet Take 10 mEq by mouth 2 (two) times daily.   Yes [provider]  PROAIR HFA 108 (90 BASE) MCG/ACT inhaler Inhale 2 puffs into the lungs every 6 (six) hours as needed.  03/12/14  Yes [provider]  rOPINIRole (REQUIP) 1 MG tablet Take 2 mg by mouth 3 times/day as needed-between meals & bedtime (restless legs).    Yes [provider]  tamsulosin (FLOMAX) 0.4 MG CAPS capsule Take 1 capsule (0.4 mg total) by mouth 2 (two) times daily. 12/21/17  Yes [name], DO  VITAMIN E PO Take 8,000 Units by mouth daily.   Yes [provider]  zonisamide (ZONEGRAN) 50 MG capsule TAKE 3 CAPSULES(150  MG) BY MOUTH AT BEDTIME Patient taking differently: Take 150 mg by mouth at bedtime.  02/15/18  Yes Eulas Post, Monica, DO  ACCU-CHEK FASTCLIX LANCETS MISC Use as directed to check blood glucose. Dx: E11.51 01/17/18   [name], NP  Blood Glucose Monitoring Suppl (ACCU-CHEK AVIVA PLUS) w/Device KIT Use as directed to check blood glucose daily. Dx: E11.51 01/17/18   [name], NP  glucose blood (ACCU-CHEK AVIVA PLUS) test strip 1 each by Other route as needed for other. Use as instructed. Dx: E11.51 01/17/18   [name], NP   Ct Tibia Fibula Right Wo Contrast  Result Date: 04/04/2018 CLINICAL DATA:  Cellulitis with weeping wounds. Diabetes. Rule out abscess. EXAM: CT OF THE LOWER RIGHT EXTREMITY WITHOUT CONTRAST TECHNIQUE: Multidetector CT imaging of the right lower extremity was performed according to the standard protocol. COMPARISON:  None. FINDINGS: Bones/Joint/Cartilage The lower pole of the included patella is sharply demarcated along its medial aspect and the possibility of an age-indeterminate fracture or injury to the patella is not excluded, series 3/1. No displaced fracture fragment is identified in this may simply reflect normal anatomic variance. If the patient has pain referable to the lower pole the patella, consider knee radiographs with patellar views. The included distal femur, femoral condyles, tibial plateau, tibia and fibula are intact without fracture or suspicious osseous lesions. Osteoarthritis of the ankle joint is small degenerative ossific densities project within posteromedial ankle joint. No joint effusion. The subtalar and midfoot articulations appear congruent. Partially included fixation screw at the base of the fifth metatarsal is seen without complicating features. Ligaments Suboptimally assessed by CT. Muscles and Tendons No intramuscular hemorrhage or mass. Mild diffuse fatty atrophy of the leg musculature. The tendons crossing the ankle joint are largely  intact. Achilles tendon is normal morphology. The peritoneal, posteromedial and extensor tendons are unremarkable without evidence of tenosynovitis. Soft tissues No drainable fluid collection. Moderate diffuse soft tissue edema is noted along the entirety of the included knee, leg and ankle extending along including the dorsum of the included midfoot. Slightly more focal soft tissue swelling is identified anterior to the midshaft of tibia with subtle hypodense appearance of the subcutaneous soft tissues likely represent flocculent fluid. This measures approximately 3 x 2 x 1 cm. IMPRESSION:  Diffuse cellulitis of the included right leg without drainable fluid collection. Subtle area of more focal soft tissue prominence and probable flocculent fluid is noted anterior to the midshaft of the tibia measuring 3 x 2 x 1 cm. No acute osseous abnormality. Electronically Signed   By: Ashley Royalty M.D.   On: 04/04/2018 15:44   - pertinent xrays, CT, MRI studies were reviewed and independently interpreted  Positive ROS: All other systems have been reviewed and were otherwise negative with the exception of those mentioned in the HPI and as above.  Physical Exam: General: Alert, no acute distress Psychiatric: Patient is competent for consent with normal mood and affect Lymphatic: No axillary or cervical lymphadenopathy Cardiovascular: No pedal edema Respiratory: No cyanosis, no use of accessory musculature GI: No organomegaly, abdomen is soft and non-tender    Images:  @ENCIMAGES @  Labs:  Lab Results  Component Value Date   HGBA1C 6.6 (H) 03/21/2018   HGBA1C 6.6 12/01/2017   HGBA1C 6.5 07/28/2017   ESRSEDRATE 30 (H) 12/11/2016   ESRSEDRATE 12 10/17/2009   CRP <0.8 12/11/2016   REPTSTATUS 12/16/2016 FINAL 12/12/2016   GRAMSTAIN NO WBC SEEN NO ORGANISMS SEEN  12/12/2016   CULT  12/12/2016    RARE STAPHYLOCOCCUS AUREUS WITHIN NORMAL SKIN FLORA    LABORGA STAPHYLOCOCCUS AUREUS 12/12/2016    Lab  Results  Component Value Date   ALBUMIN 2.6 (L) 04/02/2018   ALBUMIN 3.1 (L) 04/01/2018   ALBUMIN 3.5 12/11/2016    Neurologic: Patient does not have protective sensation bilateral lower extremities.   MUSCULOSKELETAL:   Skin: Examination patient has cellulitis involving the entire right lower extremity there is no focal area of fluctuance.  Patient has a palpable dorsalis pedis pulse good arterial inflow.  He has pitting edema up to the tibial tubercle.  There are 2 abrasions.  Review of the CT scan shows no abscess collection shows no osteomyelitis no bone involvement.  Assessment: Assessment: Diabetic insensate neuropathy with peripheral vascular disease severe protein caloric malnutrition with cellulitis and venous insufficiency of the right lower extremity.  Plan: Plan: We will have wound ostomy continence nursing apply a 4 layer compression wrap.  I will follow-up in the office in 1 week.  Patient will need nutrition consult and protein supplements will need to elevate the right lower extremity level with his heart when at rest.  Weightbearing as tolerated.  Thank you for the consult and the opportunity to see Mr. [place], MD [facility] [phone] 7:53 AM

## 2018-04-05 NOTE — Progress Notes (Signed)
PROGRESS NOTE   [name]  ERX:[id number]    DOB: November 22, 1945    DOA: 04/01/2018  PCP: [name], DO   I have briefly reviewed patients previous medical records in [facility].  Brief Narrative:  72 year old male with PMH of DM 2, HTN, HLD, CAD status post CABG, chronic pain, lives alone, ambulates at times with the help of a cane, sustained a fall on a concrete step and scraped his leg a week prior to admission, presented with right leg swelling for 1 week followed by 1 to 2 days of redness with swelling.  Admitted for cellulitis of right lower extremity.  Orthopedics consulted.   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  Empirically started on IV ceftriaxone and vancomycin, continue.  Orthopedics/Dr. Gavin Potters input appreciated-recommends WOC consultation to apply 4 layer compression wrap and follow-up in the office with him in 1 week.  Nutrition consultation appreciated.  Weightbearing as tolerated.  Of note patient has had chronic right leg swelling since CABG in 2017 but current swelling is worse than usual.  Right lower extremity venous Doppler negative for DVT or SVT.  MRSA PCR positive.  CT of right lower extremity shows findings suggestive of diffuse cellulitis without drainable fluid.  CAD status post CABG, PAD: Asymptomatic of chest pain.  Continue Plavix.  Essential hypertension: Soft blood pressures.  Continue losartan and Lasix.  Monitor closely and may have to cut back on  medications if blood pressures drop further or becomes symptomatic.  Type II DM: A1c 6.6 on 03/21/2018.  CBGs well-controlled.  Continue SSI and Tradjenta.  Chronic pain syndrome: Controlled on home regimen, continue.  Hyponatremia: Serum osmolarity 269, urine osmolarity 277.  Stable in the 129-131 range.  Anemia of chronic disease: Stable.  Outpatient follow-up and evaluation as deemed necessary.     DVT prophylaxis: Subcutaneous heparin Code Status: Full Family Communication: None at bedside Disposition: To home pending clinical improvement possibly in the next 24 to 48 hours.   Consultants:  Orthopedic  Procedures:  None  Antimicrobials:  IV vancomycin and ceftriaxone   Subjective: Reports some pain in right leg but not as bad as yesterday.  Overnight events noted in right lower extremity dressing changed due to weeping.  Indicates that swelling is better but not back to baseline.  No chest pain, dyspnea.  ROS: As above.  Objective:  Vitals:   04/05/18 0513 04/05/18 0849 04/05/18 1241 04/05/18 1411  BP: (!) 89/77   98/60  Pulse:    88  Resp:  (!) 22 18 18   Temp:    98.8 F (37.1 C)  TempSrc:    Oral  SpO2:    98%  Weight:      Height:        Examination:  General exam: Does not elderly male, moderately built and nourished lying comfortably supine in bed. Respiratory system: Clear to auscultation. Respiratory effort normal. Cardiovascular system: S1 & S2 heard, RRR. No JVD, murmurs, rubs, gallops or clicks.  Trace bilateral ankle edema, right greater left.  Telemetry personally  reviewed: Sinus rhythm with occasional PVCs. Gastrointestinal system: Abdomen is nondistended, soft and nontender. No organomegaly or masses felt. Normal bowel sounds heard. Central nervous system: Alert and oriented. No focal neurological deficits. Extremities: Symmetric 5 x 5 power. Skin: Right lower extremity with some weeping/drainage that has mildly soaked the dressing, erythema  with swelling, mild warmth but no significant tenderness or crepitus. Psychiatry: Judgement and insight appear normal. Mood & affect appropriate.     Data Reviewed: I have personally reviewed following labs and imaging studies  CBC: Recent Labs  Lab 04/01/18 1359 04/02/18 0252 04/03/18 0414 04/05/18 0321  WBC 7.1 5.8 5.9 7.7  NEUTROABS 4.8  --   --   --   HGB 12.1* 11.4* 10.9* 11.4*  HCT 37.5* 34.9* 33.3* 34.5*  MCV 95.7 93.8 94.3 93.2  PLT 332 324 313 086   Basic Metabolic Panel: Recent Labs  Lab 04/01/18 1359 04/02/18 0252 04/03/18 0414 04/04/18 0301 04/05/18 0321  NA 131* 130* 132* 131* 129*  K 4.6 4.1 4.2 3.9 4.4  CL 95* 97* 98 96* 93*  CO2 26 22 26 28 27   GLUCOSE 125* 87 104* 90 126*  BUN 13 13 7* 8 9  CREATININE 0.87 0.80 0.72 0.83 0.76  CALCIUM 8.9 8.0* 8.1* 8.3* 8.1*   Liver Function Tests: Recent Labs  Lab 04/01/18 1359 04/02/18 0252  AST 22 18  ALT 15 13  ALKPHOS 95 81  BILITOT 0.7 0.8  PROT 7.2 6.2*  ALBUMIN 3.1* 2.6*   Cardiac Enzymes: Recent Labs  Lab 04/01/18 2154 04/02/18 0252 04/02/18 0903  TROPONINI <0.03 <0.03 <0.03   CBG: Recent Labs  Lab 04/04/18 1621 04/04/18 2000 04/05/18 0620 04/05/18 1201 04/05/18 1646  GLUCAP 97 118* 107* 120* 101*    Recent Results (from the past 240 hour(s))  MRSA PCR Screening     Status: Abnormal   Collection Time: 04/01/18 10:36 PM  Result Value Ref Range Status   MRSA by PCR POSITIVE (A) NEGATIVE Final    Comment: RESULT CALLED TO, READ BACK BY AND VERIFIED WITH: RN [name] [phone] @0154  [name] Performed at [facility], [address] [place], [place] [address]          Radiology Studies: Ct Tibia Fibula Right Wo Contrast  Result Date: 04/04/2018 CLINICAL DATA:  Cellulitis with weeping wounds. Diabetes. Rule out abscess. EXAM: CT OF THE LOWER RIGHT EXTREMITY WITHOUT CONTRAST TECHNIQUE: Multidetector CT imaging of the right lower extremity was performed according to the  standard protocol. COMPARISON:  None. FINDINGS: Bones/Joint/Cartilage The lower pole of the included patella is sharply demarcated along its medial aspect and the possibility of an age-indeterminate fracture or injury to the patella is not excluded, series 3/1. No displaced fracture fragment is identified in this may simply reflect normal anatomic variance. If the patient has pain referable to the lower pole the patella, consider knee radiographs with patellar views. The included distal femur, femoral condyles, tibial plateau, tibia and fibula are intact without fracture or suspicious osseous lesions. Osteoarthritis of the ankle joint is small degenerative ossific densities project within posteromedial ankle joint. No joint effusion. The subtalar and midfoot articulations appear congruent. Partially included fixation screw at the base of the fifth metatarsal is seen without complicating features. Ligaments Suboptimally assessed by CT. Muscles and Tendons No intramuscular hemorrhage or mass. Mild diffuse fatty atrophy of the leg musculature. The tendons crossing the ankle joint are largely intact. Achilles tendon is normal morphology. The peritoneal, posteromedial and extensor  tendons are unremarkable without evidence of tenosynovitis. Soft tissues No drainable fluid collection. Moderate diffuse soft tissue edema is noted along the entirety of the included knee, leg and ankle extending along including the dorsum of the included midfoot. Slightly more focal soft tissue swelling is identified anterior to the midshaft of tibia with subtle hypodense appearance of the subcutaneous soft tissues likely represent flocculent fluid. This measures approximately 3 x 2 x 1 cm. IMPRESSION: Diffuse cellulitis of the included right leg without drainable fluid collection. Subtle area of more focal soft tissue prominence and probable flocculent fluid is noted anterior to the midshaft of the tibia measuring 3 x 2 x 1 cm. No acute  osseous abnormality. Electronically Signed   By: Ashley Royalty M.D.   On: 04/04/2018 15:44        Scheduled Meds: . buPROPion  300 mg Oral [id number]  . celecoxib  100 mg Oral BID  . cholecalciferol  5,000 Units Oral Daily  . clonazePAM  1 mg Oral Daily  . clopidogrel  75 mg Oral Daily  . feeding supplement (GLUCERNA SHAKE)  237 mL Oral TID BM  . feeding supplement (PRO-STAT SUGAR FREE 64)  30 mL Oral Daily  . fentaNYL  50 mcg Transdermal Q72H  . furosemide  20 mg Oral BID  . gabapentin  800 mg Oral QHS  . heparin  5,000 Units Subcutaneous Q8H  . insulin aspart  0-5 Units Subcutaneous QHS  . insulin aspart  0-9 Units Subcutaneous TID WC  . linagliptin  5 mg Oral Daily  . losartan  25 mg Oral Daily  . multivitamins with iron  1 tablet Oral Daily  . mupirocin ointment  1 application Nasal BID  . mupirocin ointment   Topical Daily  . pantoprazole  40 mg Oral Daily  . potassium chloride  10 mEq Oral BID  . rOPINIRole  2 mg Oral QHS  . tamsulosin  0.4 mg Oral BID  . zonisamide  150 mg Oral QHS   Continuous Infusions: . cefTRIAXone (ROCEPHIN)  IV Stopped (04/04/18 2340)  . vancomycin 750 mg (04/05/18 0905)     LOS: 1 day     Vernell Leep, MD, FACP, [facility]. [facility] Pager [phone]  If 7PM-7AM, please contact night-coverage [email or web address] Password [facility] 04/05/2018, 6:08 PM

## 2018-04-05 NOTE — H&P (View-Only) (Signed)
ORTHOPAEDIC CONSULTATION  REQUESTING PHYSICIAN: Modena Jansky, MD  Chief Complaint: Cellulitis and swelling right lower extremity.  HPI: [name] is a 72 y.o. male who presents with cellulitis and swelling right lower extremity patient has diabetic insensate neuropathy severe protein caloric malnutrition with recent falls in the right lower extremities with 2 contusions 1 over mid tibia 1 over the tibial tubercle.  Patient has cellulitis and swelling of the entire right lower extremity.  Patient is status post aortoiliac stenting with vascular vein surgery he has a follow-up with them this week.    Past Medical History:  Diagnosis Date  . Anemia    NOS  . Anxiety   . 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 BPH (benign prostatic hypertrophy)   . Broken foot Oct. 12, 2013   Right foot Fx  . CAD (coronary artery disease)   . Cellulitis and abscess of leg 03/2018   right leg  . Chronic hyponatremia   . Chronic pain syndrome   . COPD (chronic obstructive pulmonary disease) ([place])   . Depression   . Diabetes mellitus   . Diabetic foot ulcer ([place])   . DJD (degenerative joint disease)    L wrist  . DM2 (diabetes mellitus, type 2) ([place])   . GERD (gastroesophageal reflux disease)   . HLD (hyperlipidemia)   . HTN (hypertension)   . Lumbar disc disease   . Osteoporosis    tx per [facility], Dr Prudencio Burly, thought due to heavy steriod use after 1978  . Spine fracture    hx, multiple  . Varicose veins    Past Surgical History:  Procedure Laterality Date  . CARDIAC CATHETERIZATION N/A 09/19/2015   Procedure: Left Heart Cath and Coronary Angiography;  Surgeon: Adrian Prows, MD;  Location: [facility];  Service: Cardiovascular;  Laterality: N/A;  . CARPAL TUNNEL RELEASE    . CORONARY ARTERY BYPASS GRAFT N/A 09/22/2015   Procedure: CORONARY ARTERY BYPASS GRAFTING (CABG) times four using the right greater saphenous vein harvested endoscopically and the left internal  mammary artery.  LIMA-LAD, SEQ SVG-DIAG & OM, SVG-PD.;  Surgeon: [name], MD;  Location: [facility];  Service: Open Heart Surgery;  Laterality: N/A;  . EYE SURGERY    . L foot open repair jones fracture  2010    5th metetarsal   . left ankle ganglion cyst  1976  . left cataract  1996   right - 1997  . left CTS  2006   R CTS - 2007  . left foot surgery  1998   R surgery/fracture - 1999  . left wrist/hand fusion  2008  . [place], 2004  . NASAL SINUS SURGERY     multiple- x8. last 1997 with obliteration  . REPAIR THORACIC AORTA  2017  . right hand fracture  1969  . ROTATOR CUFF REPAIR  2006   R, than repeat 2011, Dr Theda Sers  . TRIGGER FINGER RELEASE Left 11/11/2014   Procedure: LEFT LONG FINGER RELEASE TRIGGER FINGER/A-1 PULLEY;  Surgeon: Milly Jakob, MD;  Location: [facility];  Service: Orthopedics;  Laterality: Left;  . vocal surgery  1996   Social History   Socioeconomic History  . Marital status: Divorced    Spouse name: Not on file  . Number of children: Not on file  . Years of education: Not on file  . Highest education level: Not on file  Occupational  History  . Not on file  Social Needs  . Financial resource strain: Not hard at all  . Food insecurity:    Worry: Never true    Inability: Never true  . Transportation needs:    Medical: No    Non-medical: No  Tobacco Use  . Smoking status: Former Smoker    Packs/day: 1.50    Years: 30.00    Pack years: 45.00    Types: Cigarettes    Last attempt to quit: 11/06/1988    Years since quitting: 29.4  . Smokeless tobacco: Never Used  Substance and Sexual Activity  . Alcohol use: Not Currently    Comment: None in 30 years but drank beer before  . Drug use: No  . Sexual activity: Not Currently  Lifestyle  . Physical activity:    Days per week: 0 days    Minutes per session: 0 min  . Stress: Rather much  Relationships  . Social connections:    Talks on phone: Never    Gets  together: More than three times a week    Attends religious service: Never    Active member of club or organization: No    Attends meetings of clubs or organizations: Never    Relationship status: Divorced  Other Topics Concern  . Not on file  Social History Narrative   Social History      Diet? I eat about what I want.       Do you drink/eat things with caffeine? yes      Marital status?                  divorced                  What year were you married? 1966 to 2007      Do you live in a house, apartment, assisted living, condo, trailer, etc.? [place]      Is it one or more stories? one      How many persons live in your home? Me only      Do you have any pets in your home? (please list) no      Highest level of education completed? Finished high school      Current or past profession: Clinical biochemist in [place] you exercise?               no                       Type & how often?      Advanced Directives      Do you have a living will? no      Do you have a DNR form?     no                             If not, do you want to discuss one?      Do you have signed POA/HPOA for forms? No      Functional Status      Do you have difficulty bathing or dressing yourself? No- shower      Do you have difficulty preparing food or eating? No- Don't want to      Do you have difficulty managing your medications?      Do you have difficulty managing your finances?      Do you  have difficulty affording your medications? No- paid for by [employer]   Family History  Problem Relation Age of Onset  . Lung cancer Father 47  . Diabetes Mother   . Osteoporosis Mother   . Throat cancer Unknown        family hx - "bad living" - also lung CA, heart disease and renal failure    . Diabetes Sister   . Suicidality Son 27  . Lung cancer Maternal Grandmother   . Diabetes Maternal Grandfather   . Heart attack Paternal Grandmother    - negative except otherwise  stated in the family history section Allergies  Allergen Reactions  . Ivp Dye [Iodinated Diagnostic Agents]     Blood Pressure dropped----pt was pre-medicated with 13 hour prep and did fine with pre-meds--amy 03/09/11   . Penicillins Nausea Only    REACTION: gi upset Has patient had a PCN reaction causing immediate rash, facial/tongue/throat swelling, SOB or lightheadedness with hypotension: No Has patient had a PCN reaction causing severe rash involving mucus membranes or skin necrosis: No Has patient had a PCN reaction that required hospitalization: No Has patient had a PCN reaction occurring within the last 10 years: No If all of the above answers are "NO", then may proceed with Cephalosporin use.   . Sulfa Antibiotics Hives  . Sulfonamide Derivatives Hives and Itching  . Lisinopril Cough  . Statins Other (See Comments)    myalgias  . Propoxyphene N-Acetaminophen Other (See Comments)    Sharp pains- headache   Prior to Admission medications   Medication Sig Start Date End Date Taking? Authorizing Provider  buPROPion (WELLBUTRIN XL) 300 MG 24 hr tablet Take 300 mg by mouth every morning.  10/12/11  Yes [provider]  celecoxib (CELEBREX) 200 MG capsule TAKE 1 CAPSULE(200 MG) BY MOUTH TWICE DAILY Patient taking differently: Take 200 mg by mouth 2 (two) times daily.  03/20/18  Yes [name], DO  Cholecalciferol (VITAMIN D) 1000 UNITS capsule Take 5,000 Units by mouth daily.    Yes [provider]  clonazePAM (KLONOPIN) 0.5 MG tablet Take 1 mg by mouth daily at 12 noon.    Yes [provider]  clopidogrel (PLAVIX) 75 MG tablet Take 75 mg by mouth daily.   Yes [provider]  cyclobenzaprine (FLEXERIL) 10 MG tablet Take 10 mg by mouth 3 (three) times daily as needed for muscle spasms.  05/05/17  Yes [provider]  desonide (DESOWEN) 0.05 % cream Apply 1 application topically daily as needed ([place]).   Yes [provider]    fentaNYL (DURAGESIC - DOSED MCG/HR) 50 MCG/HR Place 50 mcg onto the skin every 3 (three) days.   Yes [provider]  furosemide (LASIX) 20 MG tablet Take 20 mg by mouth 2 (two) times daily.   Yes [provider]  gabapentin (NEURONTIN) 400 MG capsule Take 800 mg by mouth at bedtime.    Yes [provider]  HYDROmorphone (DILAUDID) 2 MG tablet Take 2 mg by mouth every 6 (six) hours as needed for severe pain.    Yes [provider]  JANUVIA 100 MG tablet TAKE 1 TABLET BY MOUTH EVERY DAY 02/20/18  Yes Eulas Post, Monica, DO  losartan (COZAAR) 25 MG tablet Take 25 mg by mouth daily.   Yes [provider]  Multiple Vitamins-Iron (MULTIVITAMINS WITH IRON) TABS tablet Take 1 tablet by mouth daily.   Yes [provider]  mupirocin ointment (BACTROBAN) 2 % Place 1 application  into the nose 2 (two) times daily. For nasal infection x 7 days 03/28/18  Yes Eulas Post, Monica, DO  nitroGLYCERIN (NITROSTAT) 0.4 MG SL tablet Place 0.4 mg under the tongue every 5 (five) minutes as needed for chest pain.   Yes [provider]  omeprazole (PRILOSEC) 40 MG capsule Take 1 capsule (40 mg total) by mouth daily. Patient taking differently: Take 40 mg by mouth 2 (two) times daily.  12/21/17  Yes Eulas Post, Monica, DO  potassium chloride (K-DUR) 10 MEQ tablet Take 10 mEq by mouth 2 (two) times daily.   Yes [provider]  PROAIR HFA 108 (90 BASE) MCG/ACT inhaler Inhale 2 puffs into the lungs every 6 (six) hours as needed.  03/12/14  Yes [provider]  rOPINIRole (REQUIP) 1 MG tablet Take 2 mg by mouth 3 times/day as needed-between meals & bedtime (restless legs).    Yes [provider]  tamsulosin (FLOMAX) 0.4 MG CAPS capsule Take 1 capsule (0.4 mg total) by mouth 2 (two) times daily. 12/21/17  Yes [name], DO  VITAMIN E PO Take 8,000 Units by mouth daily.   Yes [provider]  zonisamide (ZONEGRAN) 50 MG capsule TAKE 3 CAPSULES(150  MG) BY MOUTH AT BEDTIME Patient taking differently: Take 150 mg by mouth at bedtime.  02/15/18  Yes Eulas Post, Monica, DO  ACCU-CHEK FASTCLIX LANCETS MISC Use as directed to check blood glucose. Dx: E11.51 01/17/18   [name], NP  Blood Glucose Monitoring Suppl (ACCU-CHEK AVIVA PLUS) w/Device KIT Use as directed to check blood glucose daily. Dx: E11.51 01/17/18   [name], NP  glucose blood (ACCU-CHEK AVIVA PLUS) test strip 1 each by Other route as needed for other. Use as instructed. Dx: E11.51 01/17/18   [name], NP   Ct Tibia Fibula Right Wo Contrast  Result Date: 04/04/2018 CLINICAL DATA:  Cellulitis with weeping wounds. Diabetes. Rule out abscess. EXAM: CT OF THE LOWER RIGHT EXTREMITY WITHOUT CONTRAST TECHNIQUE: Multidetector CT imaging of the right lower extremity was performed according to the standard protocol. COMPARISON:  None. FINDINGS: Bones/Joint/Cartilage The lower pole of the included patella is sharply demarcated along its medial aspect and the possibility of an age-indeterminate fracture or injury to the patella is not excluded, series 3/1. No displaced fracture fragment is identified in this may simply reflect normal anatomic variance. If the patient has pain referable to the lower pole the patella, consider knee radiographs with patellar views. The included distal femur, femoral condyles, tibial plateau, tibia and fibula are intact without fracture or suspicious osseous lesions. Osteoarthritis of the ankle joint is small degenerative ossific densities project within posteromedial ankle joint. No joint effusion. The subtalar and midfoot articulations appear congruent. Partially included fixation screw at the base of the fifth metatarsal is seen without complicating features. Ligaments Suboptimally assessed by CT. Muscles and Tendons No intramuscular hemorrhage or mass. Mild diffuse fatty atrophy of the leg musculature. The tendons crossing the ankle joint are largely  intact. Achilles tendon is normal morphology. The peritoneal, posteromedial and extensor tendons are unremarkable without evidence of tenosynovitis. Soft tissues No drainable fluid collection. Moderate diffuse soft tissue edema is noted along the entirety of the included knee, leg and ankle extending along including the dorsum of the included midfoot. Slightly more focal soft tissue swelling is identified anterior to the midshaft of tibia with subtle hypodense appearance of the subcutaneous soft tissues likely represent flocculent fluid. This measures approximately 3 x 2 x 1 cm. IMPRESSION:  Diffuse cellulitis of the included right leg without drainable fluid collection. Subtle area of more focal soft tissue prominence and probable flocculent fluid is noted anterior to the midshaft of the tibia measuring 3 x 2 x 1 cm. No acute osseous abnormality. Electronically Signed   By: Ashley Royalty M.D.   On: 04/04/2018 15:44   - pertinent xrays, CT, MRI studies were reviewed and independently interpreted  Positive ROS: All other systems have been reviewed and were otherwise negative with the exception of those mentioned in the HPI and as above.  Physical Exam: General: Alert, no acute distress Psychiatric: Patient is competent for consent with normal mood and affect Lymphatic: No axillary or cervical lymphadenopathy Cardiovascular: No pedal edema Respiratory: No cyanosis, no use of accessory musculature GI: No organomegaly, abdomen is soft and non-tender    Images:  @ENCIMAGES @  Labs:  Lab Results  Component Value Date   HGBA1C 6.6 (H) 03/21/2018   HGBA1C 6.6 12/01/2017   HGBA1C 6.5 07/28/2017   ESRSEDRATE 30 (H) 12/11/2016   ESRSEDRATE 12 10/17/2009   CRP <0.8 12/11/2016   REPTSTATUS 12/16/2016 FINAL 12/12/2016   GRAMSTAIN NO WBC SEEN NO ORGANISMS SEEN  12/12/2016   CULT  12/12/2016    RARE STAPHYLOCOCCUS AUREUS WITHIN NORMAL SKIN FLORA    LABORGA STAPHYLOCOCCUS AUREUS 12/12/2016    Lab  Results  Component Value Date   ALBUMIN 2.6 (L) 04/02/2018   ALBUMIN 3.1 (L) 04/01/2018   ALBUMIN 3.5 12/11/2016    Neurologic: Patient does not have protective sensation bilateral lower extremities.   MUSCULOSKELETAL:   Skin: Examination patient has cellulitis involving the entire right lower extremity there is no focal area of fluctuance.  Patient has a palpable dorsalis pedis pulse good arterial inflow.  He has pitting edema up to the tibial tubercle.  There are 2 abrasions.  Review of the CT scan shows no abscess collection shows no osteomyelitis no bone involvement.  Assessment: Assessment: Diabetic insensate neuropathy with peripheral vascular disease severe protein caloric malnutrition with cellulitis and venous insufficiency of the right lower extremity.  Plan: Plan: We will have wound ostomy continence nursing apply a 4 layer compression wrap.  I will follow-up in the office in 1 week.  Patient will need nutrition consult and protein supplements will need to elevate the right lower extremity level with his heart when at rest.  Weightbearing as tolerated.  Thank you for the consult and the opportunity to see Mr. [place], MD [facility] [phone] 7:53 AM

## 2018-04-05 NOTE — Progress Notes (Signed)
Patient dressing on right lower leg changed due to yellowish fluid draining onto gauze. Abrasions were cleaned with NS and dried. Bactroban was placed on them; they were covered with gauze and wrapped with Kerlix. Dressing was secured in place with hypafix tape. Will continue to monitor. Lajoyce Corners, RN

## 2018-04-06 ENCOUNTER — Ambulatory Visit (INDEPENDENT_AMBULATORY_CARE_PROVIDER_SITE_OTHER): Payer: Self-pay | Admitting: Physician Assistant

## 2018-04-06 LAB — GLUCOSE, CAPILLARY
GLUCOSE-CAPILLARY: 106 mg/dL — AB (ref 70–99)
GLUCOSE-CAPILLARY: 126 mg/dL — AB (ref 70–99)
Glucose-Capillary: 96 mg/dL (ref 70–99)
Glucose-Capillary: 135 mg/dL — ABNORMAL HIGH (ref 70–99)

## 2018-04-06 LAB — BASIC METABOLIC PANEL
Anion gap: 9 (ref 5–15)
BUN: 11 mg/dL (ref 8–23)
CHLORIDE: 95 mmol/L — AB (ref 98–111)
CO2: 27 mmol/L (ref 22–32)
CREATININE: 0.69 mg/dL (ref 0.61–1.24)
Calcium: 8.3 mg/dL — ABNORMAL LOW (ref 8.9–10.3)
GFR calc Af Amer: >60 mL/min (ref >60)
GFR calc non Af Amer: >60 mL/min (ref >60)
Glucose, Bld: 113 mg/dL — ABNORMAL HIGH (ref 70–99)
POTASSIUM: 4.3 mmol/L (ref 3.5–5.1)
SODIUM: 131 mmol/L — AB (ref 135–145)

## 2018-04-06 LAB — CBC
HCT: 33 % — ABNORMAL LOW (ref 39.0–52.0)
HEMOGLOBIN: 10.7 g/dL — AB (ref 13.0–17.0)
MCH: 30.6 pg (ref 26.0–34.0)
MCHC: 32.4 g/dL (ref 30.0–36.0)
MCV: 94.3 fL (ref 78.0–100.0)
Platelets: 342 10*3/uL (ref 150–400)
RBC: 3.5 MIL/uL — ABNORMAL LOW (ref 4.22–5.81)
RDW: 12.7 % (ref 11.5–15.5)
WBC: 7.2 10*3/uL (ref 4.0–10.5)

## 2018-04-06 MED ORDER — MUPIROCIN 2 % EX OINT
1.0000 "application " | TOPICAL_OINTMENT | Freq: Two times a day (BID) | CUTANEOUS | Status: AC
  Administered 2018-04-06: 1 via NASAL
  Filled 2018-04-06: qty 22

## 2018-04-06 MED ORDER — CHLORHEXIDINE GLUCONATE 4 % EX LIQD
60.0000 mL | Freq: Once | CUTANEOUS | Status: AC
  Administered 2018-04-07: 4 via TOPICAL

## 2018-04-06 NOTE — Progress Notes (Signed)
PROGRESS NOTE   [name]  CBJ:[id number]    DOB: 02/25/1946    DOA: 04/01/2018  PCP: [name], DO   I have briefly reviewed patients previous medical records in [facility]  Brief Narrative:  72 year old male with PMH of DM 2, HTN, HLD, CAD status post CABG, chronic pain, lives alone, ambulates at times with the help of a cane, sustained a fall on a concrete step and scraped his leg a week prior to admission, presented with right leg swelling for 1 week followed by 1 to 2 days of redness with swelling.  Admitted for cellulitis of right lower extremity.  Orthopedics consulted and initially recommended compression wrapping.  However on follow-up 8/22, noted 2 areas on his leg suspicious for abscesses which are actively draining pus.  I discussed with Dr. Sharol Given who plans OR 8/23 for possible I&D.   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  Empirically started on IV ceftriaxone and vancomycin, continue.  Nutrition consultation appreciated.  Weightbearing as tolerated.  Of note patient has had chronic right leg swelling since CABG in 2017 but current swelling and pain is worse than usual.  Right lower extremity venous Doppler negative for DVT or SVT.  MRSA PCR positive.  CT of right lower extremity shows findings suggestive of diffuse cellulitis without drainable fluid. Orthopedics/Dr. Sharol Given consulted and initially recommended compression wrapping.  However on my follow-up 8/22, noted 2  areas on his leg suspicious for abscesses which are actively draining pus.  I discussed with Dr. Sharol Given who plans OR 8/23 for possible I&D.  Discussed with patient who is aware and agreeable.  CAD status post CABG, PAD: Asymptomatic of chest pain.  Continue Plavix.  Essential hypertension: Normotensive.  Continue losartan and Lasix.    Type II DM: A1c 6.6 on 03/21/2018.  CBGs well-controlled.  Continue SSI and Tradjenta.  Chronic pain syndrome: Controlled on home regimen, continue.  Hyponatremia: Serum osmolarity 269, urine osmolarity 277.  Stable in the 129-131 range.  Anemia of chronic disease: Stable.  Outpatient follow-up and evaluation as deemed necessary.     DVT prophylaxis: Subcutaneous heparin Code Status: Full Family Communication: None at bedside Disposition: To be determined pending clinical improvement and further interventions.  As discussed above, plans are for I&D of right leg abscess in OR by Dr. Sharol Given on 8/23.   Consultants:  Orthopedic  Procedures:  None  Antimicrobials:  IV vancomycin and ceftriaxone   Subjective: Patient reports that his right leg pain is better even upon weightbearing.  Nursing report soaking of compression dressing.  I examined patient along with his RN, took down his right leg dressing and examined.  ROS: As above.  Objective:  Vitals:   04/06/18 0015 04/06/18 0337 04/06/18 0339 04/06/18 0448  BP: 120/67 (!) 132/52    Pulse:  73    Resp:   16   Temp:   98.8 F (37.1 C)   TempSrc:   Oral   SpO2:      Weight:    97.3 kg  Height:  Examination:  General exam: Does not elderly male, moderately built and nourished lying comfortably supine in bed.  Stable Respiratory system: Clear to auscultation. Respiratory effort normal.  Stable Cardiovascular system: S1 & S2 heard, RRR. No JVD, murmurs, rubs, gallops or clicks.  No left leg edema.  Right lower extremity with changes consistent with chronic venous stasis. Gastrointestinal  system: Abdomen is nondistended, soft and nontender. No organomegaly or masses felt. Normal bowel sounds heard.  Stable Central nervous system: Alert and oriented. No focal neurological deficits.  Stable Extremities: Symmetric 5 x 5 power.  Stable Skin: Removed right lower extremity compression dressings.  Patient has 3 small wounds, 2 of which- the one right below his knee and the second one mid anterior shin which are actively draining pus-a few drops, with associated mild fluctuation around the wounds.  Patchy minimal erythema of the leg.  No increased warmth.  Shriveling of skin of leg noted.  Patient also has denuded superficial layer of skin over left heel without other acute findings. Psychiatry: Judgement and insight appear normal. Mood & affect appropriate.     Data Reviewed: I have personally reviewed following labs and imaging studies  CBC: Recent Labs  Lab 04/01/18 1359 04/02/18 0252 04/03/18 0414 04/05/18 0321 04/06/18 0309  WBC 7.1 5.8 5.9 7.7 7.2  NEUTROABS 4.8  --   --   --   --   HGB 12.1* 11.4* 10.9* 11.4* 10.7*  HCT 37.5* 34.9* 33.3* 34.5* 33.0*  MCV 95.7 93.8 94.3 93.2 94.3  PLT 332 324 313 351 875   Basic Metabolic Panel: Recent Labs  Lab 04/02/18 0252 04/03/18 0414 04/04/18 0301 04/05/18 0321 04/06/18 0309  NA 130* 132* 131* 129* 131*  K 4.1 4.2 3.9 4.4 4.3  CL 97* 98 96* 93* 95*  CO2 22 26 28 27 27   GLUCOSE 87 104* 90 126* 113*  BUN 13 7* 8 9 11   CREATININE 0.80 0.72 0.83 0.76 0.69  CALCIUM 8.0* 8.1* 8.3* 8.1* 8.3*   Liver Function Tests: Recent Labs  Lab 04/01/18 1359 04/02/18 0252  AST 22 18  ALT 15 13  ALKPHOS 95 81  BILITOT 0.7 0.8  PROT 7.2 6.2*  ALBUMIN 3.1* 2.6*   Cardiac Enzymes: Recent Labs  Lab 04/01/18 2154 04/02/18 0252 04/02/18 0903  TROPONINI <0.03 <0.03 <0.03   CBG: Recent Labs  Lab 04/05/18 1201 04/05/18 1646 04/05/18 2029 04/06/18 0616 04/06/18 1137  GLUCAP 120* 101* 99 106* 96    Recent Results (from the  past 240 hour(s))  MRSA PCR Screening     Status: Abnormal   Collection Time: 04/01/18 10:36 PM  Result Value Ref Range Status   MRSA by PCR POSITIVE (A) NEGATIVE Final    Comment: RESULT CALLED TO, READ BACK BY AND VERIFIED WITH: RN [name] [id number] @0154  [name] Performed at [facility], [address] [place],  [address]          Radiology Studies: Ct Tibia Fibula Right Wo Contrast  Result Date: 04/04/2018 CLINICAL DATA:  Cellulitis with weeping wounds. Diabetes. Rule out abscess. EXAM: CT OF THE LOWER RIGHT EXTREMITY WITHOUT CONTRAST TECHNIQUE: Multidetector CT imaging of the right lower extremity was performed according to the standard protocol. COMPARISON:  None. FINDINGS: Bones/Joint/Cartilage The lower pole of the included patella is sharply demarcated along its medial aspect and the possibility of an age-indeterminate fracture or injury to the patella is not excluded, series 3/1. No displaced fracture fragment is identified in this may simply  reflect normal anatomic variance. If the patient has pain referable to the lower pole the patella, consider knee radiographs with patellar views. The included distal femur, femoral condyles, tibial plateau, tibia and fibula are intact without fracture or suspicious osseous lesions. Osteoarthritis of the ankle joint is small degenerative ossific densities project within posteromedial ankle joint. No joint effusion. The subtalar and midfoot articulations appear congruent. Partially included fixation screw at the base of the fifth metatarsal is seen without complicating features. Ligaments Suboptimally assessed by CT. Muscles and Tendons No intramuscular hemorrhage or mass. Mild diffuse fatty atrophy of the leg musculature. The tendons crossing the ankle joint are largely intact. Achilles tendon is normal morphology. The peritoneal, posteromedial and extensor tendons are unremarkable without evidence of tenosynovitis. Soft tissues No  drainable fluid collection. Moderate diffuse soft tissue edema is noted along the entirety of the included knee, leg and ankle extending along including the dorsum of the included midfoot. Slightly more focal soft tissue swelling is identified anterior to the midshaft of tibia with subtle hypodense appearance of the subcutaneous soft tissues likely represent flocculent fluid. This measures approximately 3 x 2 x 1 cm. IMPRESSION: Diffuse cellulitis of the included right leg without drainable fluid collection. Subtle area of more focal soft tissue prominence and probable flocculent fluid is noted anterior to the midshaft of the tibia measuring 3 x 2 x 1 cm. No acute osseous abnormality. Electronically Signed   By: Ashley Royalty M.D.   On: 04/04/2018 15:44        Scheduled Meds: . buPROPion  300 mg Oral BH-q7a  . celecoxib  100 mg Oral BID  . cholecalciferol  5,000 Units Oral Daily  . clonazePAM  1 mg Oral Daily  . clopidogrel  75 mg Oral Daily  . feeding supplement (GLUCERNA SHAKE)  237 mL Oral TID BM  . feeding supplement (PRO-STAT SUGAR FREE 64)  30 mL Oral Daily  . fentaNYL  50 mcg Transdermal Q72H  . furosemide  20 mg Oral BID  . gabapentin  800 mg Oral QHS  . heparin  5,000 Units Subcutaneous Q8H  . insulin aspart  0-5 Units Subcutaneous QHS  . insulin aspart  0-9 Units Subcutaneous TID WC  . linagliptin  5 mg Oral Daily  . losartan  25 mg Oral Daily  . multivitamins with iron  1 tablet Oral Daily  . mupirocin ointment  1 application Nasal BID  . mupirocin ointment   Topical Daily  . pantoprazole  40 mg Oral Daily  . potassium chloride  10 mEq Oral BID  . rOPINIRole  2 mg Oral QHS  . tamsulosin  0.4 mg Oral BID  . zonisamide  150 mg Oral QHS   Continuous Infusions: . cefTRIAXone (ROCEPHIN)  IV Stopped (04/05/18 2302)  . vancomycin 750 mg (04/06/18 0852)     LOS: 2 days     Vernell Leep, MD, FACP, [facility]. [facility] Pager [phone]  If 7PM-7AM, please contact  night-coverage [email or web address] Password [facility] 04/06/2018, 1:59 PM

## 2018-04-06 NOTE — Progress Notes (Signed)
Noticed patient had bloody tissues on floor and asked about them. Patient stated he had pulled some skin off of his left heel. Assessed the area and noticed peeling, cracked skin. Cleansed the heel with normal saline and gauze. Covered the area with foam pad. Will continue to monitor. Lajoyce Corners, RN

## 2018-04-06 NOTE — Progress Notes (Signed)
Patient says that he feels some numbness in his right foot. He had a 4-layer compression wrap placed today by [facility]. Have elevated foot of bed and placed pillow under extremity to elevate it. Instructed patient that he needs to keep it elevated as ordered by MD. Patient not always compliant with this. Will continue to monitor. Lajoyce Corners, RN

## 2018-04-07 ENCOUNTER — Encounter (HOSPITAL_COMMUNITY): Payer: Self-pay | Admitting: Orthopedic Surgery

## 2018-04-07 ENCOUNTER — Ambulatory Visit: Payer: PRIVATE HEALTH INSURANCE | Admitting: Family

## 2018-04-07 ENCOUNTER — Inpatient Hospital Stay (HOSPITAL_COMMUNITY): Payer: Medicare Other | Admitting: Anesthesiology

## 2018-04-07 ENCOUNTER — Encounter (HOSPITAL_COMMUNITY): Payer: PRIVATE HEALTH INSURANCE

## 2018-04-07 ENCOUNTER — Encounter (HOSPITAL_COMMUNITY)
Admission: EM | Discharge status: Against Medical Advice | Payer: Self-pay | Source: Home / Self Care | Attending: Internal Medicine

## 2018-04-07 DIAGNOSIS — L02415 Cutaneous abscess of right lower limb: Secondary | ICD-10-CM

## 2018-04-07 HISTORY — PX: I & D EXTREMITY: SHX5045

## 2018-04-07 LAB — GLUCOSE, CAPILLARY
Glucose-Capillary: 93 mg/dL (ref 70–99)
Glucose-Capillary: 101 mg/dL — ABNORMAL HIGH (ref 70–99)
Glucose-Capillary: 81 mg/dL (ref 70–99)
Glucose-Capillary: 111 mg/dL — ABNORMAL HIGH (ref 70–99)
Glucose-Capillary: 109 mg/dL — ABNORMAL HIGH (ref 70–99)

## 2018-04-07 LAB — VANCOMYCIN, TROUGH: Vancomycin Tr: 10 ug/mL — ABNORMAL LOW (ref 15–20)

## 2018-04-07 SURGERY — IRRIGATION AND DEBRIDEMENT EXTREMITY
Anesthesia: Monitor Anesthesia Care | Laterality: Right

## 2018-04-07 MED ORDER — HYDROMORPHONE HCL 1 MG/ML IJ SOLN
0.2500 mg | INTRAMUSCULAR | Status: DC | PRN
  Administered 2018-04-07: 0.5 mg via INTRAVENOUS

## 2018-04-07 MED ORDER — HYDROMORPHONE HCL 1 MG/ML IJ SOLN: 0.5000 mg | INTRAMUSCULAR | Status: DC | PRN

## 2018-04-07 MED ORDER — ONDANSETRON HCL 4 MG PO TABS: 4.0000 mg | ORAL_TABLET | Freq: Four times a day (QID) | ORAL | Status: DC | PRN

## 2018-04-07 MED ORDER — VANCOMYCIN HCL IN DEXTROSE 1-5 GM/200ML-% IV SOLN
1000.0000 mg | Freq: Two times a day (BID) | INTRAVENOUS | Status: DC
  Filled 2018-04-07 (×2): qty 200

## 2018-04-07 MED ORDER — BISACODYL 10 MG RE SUPP: 10.0000 mg | Freq: Every day | RECTAL | Status: DC | PRN

## 2018-04-07 MED ORDER — FENTANYL CITRATE (PF) 100 MCG/2ML IJ SOLN
25.0000 ug | INTRAMUSCULAR | Status: DC | PRN
  Administered 2018-04-07 (×3): 50 ug via INTRAVENOUS

## 2018-04-07 MED ORDER — LIDOCAINE HCL (CARDIAC) PF 100 MG/5ML IV SOSY
PREFILLED_SYRINGE | INTRAVENOUS | Status: DC | PRN
  Administered 2018-04-07: 60 mg via INTRAVENOUS

## 2018-04-07 MED ORDER — ACETAMINOPHEN 325 MG PO TABS: 325.0000 mg | ORAL_TABLET | Freq: Four times a day (QID) | ORAL | Status: DC | PRN

## 2018-04-07 MED ORDER — METHOCARBAMOL 1000 MG/10ML IJ SOLN
500.0000 mg | Freq: Four times a day (QID) | INTRAVENOUS | Status: DC | PRN
  Filled 2018-04-07: qty 5

## 2018-04-07 MED ORDER — PROPOFOL 10 MG/ML IV BOLUS
INTRAVENOUS | Status: AC
  Filled 2018-04-07: qty 20

## 2018-04-07 MED ORDER — VANCOMYCIN HCL 1000 MG IV SOLR
INTRAVENOUS | Status: DC | PRN
  Administered 2018-04-07: 1000 mg via INTRAVENOUS

## 2018-04-07 MED ORDER — SODIUM CHLORIDE 0.9 % IV SOLN
INTRAVENOUS | Status: DC
  Administered 2018-04-07: 12:00:00 via INTRAVENOUS

## 2018-04-07 MED ORDER — METOCLOPRAMIDE HCL 5 MG PO TABS: 5.0000 mg | ORAL_TABLET | Freq: Three times a day (TID) | ORAL | Status: DC | PRN

## 2018-04-07 MED ORDER — METOCLOPRAMIDE HCL 5 MG/ML IJ SOLN: 5.0000 mg | Freq: Three times a day (TID) | INTRAMUSCULAR | Status: DC | PRN

## 2018-04-07 MED ORDER — FENTANYL CITRATE (PF) 100 MCG/2ML IJ SOLN
INTRAMUSCULAR | Status: AC
  Filled 2018-04-07: qty 2

## 2018-04-07 MED ORDER — LACTATED RINGERS IV SOLN
INTRAVENOUS | Status: DC | PRN
  Administered 2018-04-07: 10:00:00 via INTRAVENOUS

## 2018-04-07 MED ORDER — OXYCODONE HCL 5 MG PO TABS
5.0000 mg | ORAL_TABLET | ORAL | Status: DC | PRN
  Filled 2018-04-07: qty 2

## 2018-04-07 MED ORDER — DOCUSATE SODIUM 100 MG PO CAPS: 100.0000 mg | ORAL_CAPSULE | Freq: Two times a day (BID) | ORAL | Status: DC

## 2018-04-07 MED ORDER — MAGNESIUM CITRATE PO SOLN: 1.0000 | Freq: Once | ORAL | Status: DC | PRN

## 2018-04-07 MED ORDER — POLYETHYLENE GLYCOL 3350 17 G PO PACK: 17.0000 g | PACK | Freq: Every day | ORAL | Status: DC | PRN

## 2018-04-07 MED ORDER — ONDANSETRON HCL 4 MG/2ML IJ SOLN
INTRAMUSCULAR | Status: DC | PRN
  Administered 2018-04-07: 4 mg via INTRAVENOUS

## 2018-04-07 MED ORDER — 0.9 % SODIUM CHLORIDE (POUR BTL) OPTIME
TOPICAL | Status: DC | PRN
  Administered 2018-04-07: 1000 mL

## 2018-04-07 MED ORDER — OXYCODONE HCL 5 MG PO TABS
10.0000 mg | ORAL_TABLET | ORAL | Status: DC | PRN
  Administered 2018-04-07: 10 mg via ORAL

## 2018-04-07 MED ORDER — METHOCARBAMOL 500 MG PO TABS: 500.0000 mg | ORAL_TABLET | Freq: Four times a day (QID) | ORAL | Status: DC | PRN

## 2018-04-07 MED ORDER — PROPOFOL 10 MG/ML IV BOLUS
INTRAVENOUS | Status: DC | PRN
  Administered 2018-04-07: 30 mg via INTRAVENOUS
  Administered 2018-04-07: 20 mg via INTRAVENOUS
  Administered 2018-04-07: 50 mg via INTRAVENOUS

## 2018-04-07 MED ORDER — ONDANSETRON HCL 4 MG/2ML IJ SOLN: 4.0000 mg | Freq: Four times a day (QID) | INTRAMUSCULAR | Status: DC | PRN

## 2018-04-07 MED ORDER — LACTATED RINGERS IV SOLN
INTRAVENOUS | Status: DC
  Administered 2018-04-07: 09:00:00 via INTRAVENOUS

## 2018-04-07 MED ORDER — HYDROMORPHONE HCL 1 MG/ML IJ SOLN
INTRAMUSCULAR | Status: AC
  Filled 2018-04-07: qty 1

## 2018-04-07 SURGICAL SUPPLY — 32 items
BLADE SURG 21 STRL SS (BLADE) ×3 IMPLANT
BNDG COHESIVE 6X5 TAN STRL LF (GAUZE/BANDAGES/DRESSINGS) ×2 IMPLANT
BNDG GAUZE ELAST 4 BULKY (GAUZE/BANDAGES/DRESSINGS) ×4 IMPLANT
COVER SURGICAL LIGHT HANDLE (MISCELLANEOUS) ×6 IMPLANT
DRAPE U-SHAPE 47X51 STRL (DRAPES) ×3 IMPLANT
DRSG ADAPTIC 3X8 NADH LF (GAUZE/BANDAGES/DRESSINGS) ×3 IMPLANT
DRSG PAD ABDOMINAL 8X10 ST (GAUZE/BANDAGES/DRESSINGS) ×2 IMPLANT
DURAPREP 26ML APPLICATOR (WOUND CARE) ×3 IMPLANT
ELECT REM PT RETURN 9FT ADLT (ELECTROSURGICAL)
ELECTRODE REM PT RTRN 9FT ADLT (ELECTROSURGICAL) IMPLANT
GAUZE SPONGE 4X4 12PLY STRL (GAUZE/BANDAGES/DRESSINGS) ×3 IMPLANT
GAUZE SPONGE 4X4 12PLY STRL LF (GAUZE/BANDAGES/DRESSINGS) ×2 IMPLANT
GLOVE BIOGEL PI IND STRL 9 (GLOVE) ×1 IMPLANT
GLOVE BIOGEL PI INDICATOR 9 (GLOVE) ×2
GLOVE SURG ORTHO 9.0 STRL STRW (GLOVE) ×3 IMPLANT
GOWN STRL REUS W/ TWL XL LVL3 (GOWN DISPOSABLE) ×2 IMPLANT
GOWN STRL REUS W/TWL XL LVL3 (GOWN DISPOSABLE) ×4
HANDPIECE INTERPULSE COAX TIP (DISPOSABLE)
KIT BASIN OR (CUSTOM PROCEDURE TRAY) ×3 IMPLANT
KIT TURNOVER KIT B (KITS) ×3 IMPLANT
MANIFOLD NEPTUNE II (INSTRUMENTS) ×3 IMPLANT
NS IRRIG 1000ML POUR BTL (IV SOLUTION) ×3 IMPLANT
PACK ORTHO EXTREMITY (CUSTOM PROCEDURE TRAY) ×3 IMPLANT
PAD ARMBOARD 7.5X6 YLW CONV (MISCELLANEOUS) ×6 IMPLANT
SET HNDPC FAN SPRY TIP SCT (DISPOSABLE) IMPLANT
STOCKINETTE IMPERVIOUS 9X36 MD (GAUZE/BANDAGES/DRESSINGS) IMPLANT
SWAB COLLECTION DEVICE MRSA (MISCELLANEOUS) ×3 IMPLANT
SWAB CULTURE ESWAB REG 1ML (MISCELLANEOUS) IMPLANT
TOWEL OR 17X26 10 PK STRL BLUE (TOWEL DISPOSABLE) ×3 IMPLANT
TUBE CONNECTING 12'X1/4 (SUCTIONS) ×1
TUBE CONNECTING 12X1/4 (SUCTIONS) ×2 IMPLANT
YANKAUER SUCT BULB TIP NO VENT (SUCTIONS) ×3 IMPLANT

## 2018-04-07 NOTE — Progress Notes (Signed)
ANTIBIOTIC CONSULT NOTE  Pharmacy Consult for Vancomycin Indication: cellulitis  Allergies  Allergen Reactions  . Ivp Dye [Iodinated Diagnostic Agents]     Blood Pressure dropped----pt was pre-medicated with 13 hour prep and did fine with pre-meds--amy 03/09/11   . Penicillins Nausea Only    REACTION: gi upset Has patient had a PCN reaction causing immediate rash, facial/tongue/throat swelling, SOB or lightheadedness with hypotension: No Has patient had a PCN reaction causing severe rash involving mucus membranes or skin necrosis: No Has patient had a PCN reaction that required hospitalization: No Has patient had a PCN reaction occurring within the last 10 years: No If all of the above answers are "NO", then may proceed with Cephalosporin use.   . Sulfa Antibiotics Hives  . Sulfonamide Derivatives Hives and Itching  . Lisinopril Cough  . Statins Other (See Comments)    myalgias  . Propoxyphene N-Acetaminophen Other (See Comments)    Sharp pains- headache    Patient Measurements: Height: 6' 0.01" (182.9 cm) Weight: 210 lb 15.7 oz (95.7 kg) IBW/kg (Calculated) : 77.62 Adjusted Body Weight:    Vital Signs: Temp: 98.1 F (36.7 C) (08/23 1033) Temp Source: Oral (08/23 0506) BP: 128/94 (08/23 1033) Pulse Rate: 55 (08/23 1033) Intake/Output from previous day: 08/22 0701 - 08/23 0700 In: 240 [P.O.:240] Out: 1500 [Urine:1500] Intake/Output from this shift: Total I/O In: 575 [I.V.:500; Other:75] Out: 5 [Blood:5]  Labs: Recent Labs    04/05/18 0321 04/06/18 0309  WBC 7.7 7.2  HGB 11.4* 10.7*  PLT 351 342  CREATININE 0.76 0.69   Estimated Creatinine Clearance: 101.6 mL/min (by C-G formula based on SCr of 0.69 mg/dL). Recent Labs    04/07/18 0726  [place] 10*     Microbiology: Recent Results (from the past 720 hour(s))  MRSA PCR Screening     Status: Abnormal   Collection Time: 04/01/18 10:36 PM  Result Value Ref Range Status   MRSA by PCR POSITIVE (A)  NEGATIVE Final    Comment: RESULT CALLED TO, READ BACK BY AND VERIFIED WITH: RN [name] [id number] @0154  [name] Performed at [facility], [address], [place], [place] [address]     Medical History: Past Medical History:  Diagnosis Date  . Anemia    NOS  . Anxiety   . 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 BPH (benign prostatic hypertrophy)   . Broken foot Oct. 12, 2013   Right foot Fx  . CAD (coronary artery disease)   . Cellulitis and abscess of leg 03/2018   right leg  . Chronic hyponatremia   . Chronic pain syndrome   . COPD (chronic obstructive pulmonary disease) ([place])   . Depression   . Diabetes mellitus   . Diabetic foot ulcer ([place])   . DJD (degenerative joint disease)    L wrist  . DM2 (diabetes mellitus, type 2) ([place])   . GERD (gastroesophageal reflux disease)   . HLD (hyperlipidemia)   . HTN (hypertension)   . Lumbar disc disease   . Osteoporosis    tx per [facility], Dr Prudencio Burly, thought due to heavy steriod use after 1978  . Spine fracture    hx, multiple  . Varicose veins    Assessment: ID: day # 7 Vanc/CTX for RLE cellulitis. 2 areas suspicious for abscess and actively draining pus. I&D on 8/23 Afeb, WBC 7.2  Vanc 8/17 >> Zosyn 8/17 x 1 CTX 8/17 >>  8/17 MRSA PCR: pos  8/23: VT: 10: Incr 1g/12h  Goal of Therapy:  Vancomycin trough level 10-15 mcg/ml  Plan:  Increase Vancomycin with next dose to 1g IV q 12 hrs.  [name], PharmD, BCPS Clinical Staff Pharmacist Pager [phone]  [name] 04/07/2018,11:36 AM

## 2018-04-07 NOTE — Op Note (Signed)
04/07/2018  10:06 AM  PATIENT:  [name]    PRE-OPERATIVE DIAGNOSIS:  Abscess Right Leg  POST-OPERATIVE DIAGNOSIS:  Same  PROCEDURE:  IRRIGATION AND DEBRIDEMENT ABSCESS RIGHT LEG With local tissue rearrangement for wound closure total surface area 10 x 2 cm.  SURGEON:  Newt Minion, MD  PHYSICIAN ASSISTANT:None ANESTHESIA:   General  PREOPERATIVE INDICATIONS:  [name] is a  72 y.o. male with a diagnosis of Abscess Right Leg who failed conservative measures and elected for surgical management.    The risks benefits and alternatives were discussed with the patient preoperatively including but not limited to the risks of infection, bleeding, nerve injury, cardiopulmonary complications, the need for revision surgery, among others, and the patient was willing to proceed.  OPERATIVE IMPLANTS: None  @ENCIMAGES @  OPERATIVE FINDINGS: Abscess and necrotic tissue excised in one block of tissue  OPERATIVE PROCEDURE: Patient was brought the operating room and underwent a general anesthetic.  After adequate levels of anesthesia were obtained patient's right lower extremity was prepped using DuraPrep draped into a sterile field.  Patient had 2 fluctuant areas anteriorly over the tibia these were ellipsed out in a football shaped longitudinally.  The necrotic tissue skin fascia and muscle was excised in one block of tissue.  The wounds were irrigated with normal saline the total measurements of the 2 wounds was 10 x 2 cm.  Patient also had a another wound over the tibial tubercle and a rondure was used to debride this of skin soft tissue and fascia back to healthy viable margins.  All wounds were irrigated normal saline the 2 surgical incisions were closed using local tissue rearrangement with 2-0 nylon.  A sterile compressive dressing was applied from the metatarsal heads to the tibial tubercle.  Patient was extubated taken to PACU in stable condition.   DISCHARGE PLANNING:  Antibiotic  duration: Continue antibiotics for 24 hours postoperatively.  Weightbearing: Weightbearing as tolerated  Pain medication: High-dose opioid pathway ordered  Dressing care/ Wound VAC: Keep dressing intact for 1 week  Ambulatory devices: Walker  Discharge to: Anticipate discharge to skilled nursing.  Follow-up: In the office 1 week post operative.

## 2018-04-07 NOTE — Consult Note (Signed)
            [facility] CM Primary Care Navigator  04/07/2018  Haris Baack [birth year] [id number]   Martin Majestic to see patient at the bedside to identify possible discharge needs but he is off the unit in the OR for surgery. (Irrigation and debridement of right leg abscess)  Per MD note, he was admitted for cellulitis of right lower extremity after sustaining a fall on a concrete step and scraped his leg.   Will attempt to see patient at another time when he is available in the room.    Addendum (04/10/18):  Went back to see patient at the bedside to identify possible discharge needs but patient left AMA- Against Medical Advice before being seen by the provider or completing formal discharge paperwork.  Primary care provider's office called ([place] for Judson Roch) to notify of patient leaving the hospital against medical advice and need for post hospital follow-up and transition of care (TOC). Notified of patient's health issues needing close follow-up.  Made aware to refer patient to [facility] care management if deemed necessary and appropriate for services.   For additional questions please contact:  Edwena Felty A. Royalty Domagala, BSN, RN-BC [facility] PRIMARY CARE Navigator Cell: [phone]

## 2018-04-07 NOTE — Progress Notes (Signed)
Orthopedic Tech Progress Note Patient Details:  [name] 08/28/1945 [id number]  Ortho Devices Type of Ortho Device: Prafo boot/shoe Ortho Device/Splint Location: rue Ortho Device/Splint Interventions: Application   Post Interventions Patient Tolerated: Well Instructions Provided: Care of device   [name] 04/07/2018, 1:59 PM

## 2018-04-07 NOTE — Anesthesia Postprocedure Evaluation (Signed)
Anesthesia Post Note  Patient: Engineer, building services  Procedure(s) Performed: IRRIGATION AND DEBRIDEMENT ABSCESS RIGHT LEG (Right )     Patient location during evaluation: PACU Anesthesia Type: MAC Level of consciousness: awake and alert Pain management: pain level controlled Vital Signs Assessment: post-procedure vital signs reviewed and stable Respiratory status: spontaneous breathing, nonlabored ventilation, respiratory function stable and patient connected to nasal cannula oxygen Cardiovascular status: stable and blood pressure returned to baseline Postop Assessment: no apparent nausea or vomiting Anesthetic complications: no    Last Vitals:  Vitals:   04/07/18 1033 04/07/18 1100  BP: (!) 128/94 124/69  Pulse: (!) 55 71  Resp: 18 18  Temp: 36.7 C 36.8 C  SpO2: 95% 95%    Last Pain:  Vitals:   04/07/18 1100  TempSrc: Oral  PainSc:                  Galileo Colello L Amari Burnsworth

## 2018-04-07 NOTE — Social Work (Signed)
CSW acknowledging consult for SNF placement, pt has discussed possible home health options but will follow for SNF placement if therapy recommends and pt is amenable.   Alexander Mt, [place] Work [phone]

## 2018-04-07 NOTE — Progress Notes (Signed)
Patient arrives to room 5n15 at this time from pacu

## 2018-04-07 NOTE — Anesthesia Preprocedure Evaluation (Addendum)
Anesthesia Evaluation  Patient identified by MRN, date of birth, ID band Patient awake    Reviewed: Allergy & Precautions, NPO status , Patient's Chart, lab work & pertinent test results  Airway Mallampati: II  TM Distance: >3 FB Neck ROM: Full    Dental no notable dental hx. (+) Edentulous Upper, Edentulous Lower, Dental Advisory Given   Pulmonary COPD, former smoker,    Pulmonary exam normal breath sounds clear to auscultation       Cardiovascular hypertension, Pt. on medications + CAD, + CABG and + Peripheral Vascular Disease (thoracic aortic aneurysm repair 2017)  Normal cardiovascular exam Rhythm:Regular Rate:Normal  TTE 09/2015 Study Conclusions  - Left ventricle: The cavity size was normal. Wall thickness was normal. Systolic function was normal. The estimated ejection fraction was in the range of 50% to 55%.  - Left atrium: The atrium was severely dilated. - Right atrium: The atrium was moderately dilated. - Pulmonary arteries: Systolic pressure was mildly increased. PA peak pressure: 37 mm Hg (S).  LHC 09/2105 prior to CABG Dominance: Right. Mild aortic root calcification and diffuse coronary calcification is evident.   Left Main    . Ost LM lesion, 60% stenosed. The lesion is type C Calcified.   . LM lesion, 80% stenosed. The lesion is type C Diffuse. Cauliflower-like lesion in the distal LM    Left Anterior Descending    . Prox LAD to Mid LAD lesion, 95% stenosed. The lesion is type C Calcified. Cauliflower-like calcific stenosis.     Left Circumflex    . Ost Cx lesion, 90% stenosed. The lesion is type C Calcified.   . Prox Cx to Mid Cx lesion, 95% stenosed. The lesion is type C Calcified.    Right Coronary Artery    . Prox RCA lesion, 70% stenosed. Calcified.   . Prox RCA to Mid RCA lesion, 70% stenosed. The lesion is type C Calcified. 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 Large PDA and PL branches.   LV gram: Normal LV systolic function, no significant mitral regurgitation. Mitral valve annulus normal.  Left subclavian artery/LIMA: Widely patent. Right subclavian artery shows a midsegment large calcific cauliflower-like [place] stenosis of 70-80% or more. There was a 60 mm pressure difference between the right and left arm. However the right subclavian artery itself was patent with antegrade injection. 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   Neuro/Psych Anxiety Depression negative neurological ROS     GI/Hepatic Neg liver ROS, GERD  ,  Endo/Other  negative endocrine ROSdiabetes, Type 2  Renal/GU negative Renal ROS  negative genitourinary   Musculoskeletal  (+) Arthritis ,   Abdominal   Peds  Hematology  (+) anemia ,   Anesthesia Other Findings   Reproductive/Obstetrics                            Anesthesia Physical Anesthesia Plan  ASA: III  Anesthesia Plan: MAC   Post-op Pain Management:    Induction: Intravenous  PONV Risk Score and Plan: 1 and Ondansetron, Dexamethasone and Propofol infusion  Airway Management Planned:   Additional Equipment:   Intra-op Plan:   Post-operative Plan:   Informed Consent: I have reviewed the patients History and Physical, chart, labs and discussed the procedure including the risks, benefits and alternatives for the proposed anesthesia with the patient or authorized representative who has indicated his/her  understanding and acceptance.   Dental advisory given  Plan Discussed with: CRNA  Anesthesia Plan Comments:        Anesthesia Quick Evaluation

## 2018-04-07 NOTE — Progress Notes (Signed)
PROGRESS NOTE   [name]  QQP:[id number]    DOB: [birth year]    DOA: 04/01/2018  PCP: [name], DO   I have briefly reviewed patients previous medical records in [facility].  Brief Narrative:  72 year old male with PMH of DM 2, HTN, HLD, CAD status post CABG, chronic pain, lives alone, ambulates at times with the help of a cane, sustained a fall on a concrete step and scraped his leg a week prior to admission, presented with right leg swelling for 1 week followed by 1 to 2 days of redness with swelling.  Admitted for cellulitis of right lower extremity.  Orthopedics consulted and initially recommended compression wrapping.  However on follow-up 8/22, noted 2 areas on his leg suspicious for abscesses which are actively draining pus.  Patient is status post I&D of right leg abscesses 8/23.  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  Empirically started on IV ceftriaxone and vancomycin, continue.  Nutrition consultation appreciated.  Weightbearing as tolerated.  Of note patient has had chronic right leg swelling since CABG in 2017 but current swelling and pain is worse than usual.  Right lower extremity venous Doppler negative for DVT or SVT.  MRSA PCR positive.  CT of right lower extremity shows findings suggestive of diffuse cellulitis without drainable fluid. Orthopedics/Dr. Sharol Given consulted and initially recommended compression wrapping.  However on my  follow-up 8/22, noted 2 areas on his leg suspicious for abscesses which are actively draining pus.  Patient status post I&D of right leg abscesses on 8/23.  Continue management per orthopedics.  CAD status post CABG, PAD: Asymptomatic of chest pain.  Continue Plavix.  Essential hypertension: Normotensive.  Continue losartan and Lasix.    Type II DM: A1c 6.6 on 03/21/2018.  CBGs well-controlled.  Continue SSI and Tradjenta.  Chronic pain syndrome: Controlled on home regimen, continue.  Hyponatremia: Serum osmolarity 269, urine osmolarity 277.  Stable in the 129-131 range.  Anemia of chronic disease: Stable.  Outpatient follow-up and evaluation as deemed necessary.     DVT prophylaxis: Subcutaneous heparin Code Status: Full Family Communication: None at bedside Disposition: To be determined pending clinical improvement and further interventions.  As discussed above, plans are for I&D of right leg abscess in OR by Dr. Sharol Given on 8/23.   Consultants:  Orthopedic  Procedures:  None  Antimicrobials:  IV vancomycin and ceftriaxone   Subjective: Patient seen this morning prior to procedure.  Reported some itching of right leg onto the dressing but no significant pain.  ROS: As above.  Objective:  Vitals:   04/07/18 1002 04/07/18 1016 04/07/18 1033 04/07/18 1100  BP: 118/65 138/61 (!) 128/94 124/69  Pulse: 70 74 (!) 55 71  Resp: 13 15 18 18   Temp: (!) 97.3 F (36.3 C)  98.1 F (36.7 C) 98.2 F (36.8 C)  TempSrc:    Oral  SpO2: 98% 98% 95% 95%  Weight:      Height:        Examination: No significant  change in clinical exam compared to yesterday.  General exam: Does not elderly male, moderately built and nourished lying comfortably supine in bed.  Stable Respiratory system: Clear to auscultation. Respiratory effort normal.  Stable Cardiovascular system: S1 & S2 heard, RRR. No JVD, murmurs, rubs, gallops or clicks.  No left leg edema.  Right lower extremity with changes  consistent with chronic venous stasis. Gastrointestinal system: Abdomen is nondistended, soft and nontender. No organomegaly or masses felt. Normal bowel sounds heard.  Stable Central nervous system: Alert and oriented. No focal neurological deficits.  Stable Extremities: Symmetric 5 x 5 power.  Stable Skin: Right leg dressing mildly soaked this morning. Psychiatry: Judgement and insight appear normal. Mood & affect appropriate.     Data Reviewed: I have personally reviewed following labs and imaging studies  CBC: Recent Labs  Lab 04/01/18 1359 04/02/18 0252 04/03/18 0414 04/05/18 0321 04/06/18 0309  WBC 7.1 5.8 5.9 7.7 7.2  NEUTROABS 4.8  --   --   --   --   HGB 12.1* 11.4* 10.9* 11.4* 10.7*  HCT 37.5* 34.9* 33.3* 34.5* 33.0*  MCV 95.7 93.8 94.3 93.2 94.3  PLT 332 324 313 351 161   Basic Metabolic Panel: Recent Labs  Lab 04/02/18 0252 04/03/18 0414 04/04/18 0301 04/05/18 0321 04/06/18 0309  NA 130* 132* 131* 129* 131*  K 4.1 4.2 3.9 4.4 4.3  CL 97* 98 96* 93* 95*  CO2 22 26 28 27 27   GLUCOSE 87 104* 90 126* 113*  BUN 13 7* 8 9 11   CREATININE 0.80 0.72 0.83 0.76 0.69  CALCIUM 8.0* 8.1* 8.3* 8.1* 8.3*   Liver Function Tests: Recent Labs  Lab 04/01/18 1359 04/02/18 0252  AST 22 18  ALT 15 13  ALKPHOS 95 81  BILITOT 0.7 0.8  PROT 7.2 6.2*  ALBUMIN 3.1* 2.6*   Cardiac Enzymes: Recent Labs  Lab 04/01/18 2154 04/02/18 0252 04/02/18 0903  TROPONINI <0.03 <0.03 <0.03   CBG: Recent Labs  Lab 04/06/18 2142 04/07/18 0642 04/07/18 0918 04/07/18 1010 04/07/18 1148  GLUCAP 135* 93 101* 81 111*    Recent Results (from the past 240 hour(s))  MRSA PCR Screening     Status: Abnormal   Collection Time: 04/01/18 10:36 PM  Result Value Ref Range Status   MRSA by PCR POSITIVE (A) NEGATIVE Final    Comment: RESULT CALLED TO, READ BACK BY AND VERIFIED WITH: RN [name] [phone] @0154  [name] Performed at [facility], [address], [place],   [address]          Radiology Studies: No results found.      Scheduled Meds: . buPROPion  300 mg Oral BH-q7a  . celecoxib  100 mg Oral BID  . cholecalciferol  5,000 Units Oral Daily  . clonazePAM  1 mg Oral Daily  . clopidogrel  75 mg Oral Daily  . docusate sodium  100 mg Oral BID  . feeding supplement (GLUCERNA SHAKE)  237 mL Oral TID BM  . feeding supplement (PRO-STAT SUGAR FREE 64)  30 mL Oral Daily  . fentaNYL  50 mcg Transdermal Q72H  . furosemide  20 mg Oral BID  . gabapentin  800 mg Oral QHS  . heparin  5,000 Units Subcutaneous Q8H  . insulin aspart  0-5 Units Subcutaneous QHS  . insulin aspart  0-9 Units Subcutaneous TID WC  . linagliptin  5 mg Oral Daily  . losartan  25 mg Oral Daily  . multivitamins with iron  1 tablet Oral Daily  . mupirocin ointment   Topical Daily  . pantoprazole  40 mg Oral Daily  . potassium chloride  10 mEq Oral BID  . rOPINIRole  2 mg Oral QHS  . tamsulosin  0.4 mg Oral BID  . zonisamide  150 mg Oral QHS   Continuous Infusions: . sodium chloride 10 mL/hr at 04/07/18 1142  . cefTRIAXone (ROCEPHIN)  IV 2 g (04/06/18 2228)  . lactated ringers 10 mL/hr at 04/07/18 0917  . methocarbamol (ROBAXIN) IV    . vancomycin       LOS: 3 days     Vernell Leep, MD, FACP, [facility]. [facility] Pager [phone]  If 7PM-7AM, please contact night-coverage [email or web address] Password [facility] 04/07/2018, 4:05 PM

## 2018-04-07 NOTE — Progress Notes (Signed)
Patient noticed walking to bathroom unassisted.  Again reinforced to patient the importance of calling for a staff member when he wants to get up.  Patient once again states he will get up whenever he wants to.  Will continue to monitor

## 2018-04-07 NOTE — Plan of Care (Signed)
  Problem: Health Behavior/Discharge Planning: Goal: Ability to manage health-related needs will improve Outcome: Progressing   Problem: Clinical Measurements: Goal: Ability to maintain clinical measurements within normal limits will improve Outcome: Progressing Goal: Will remain free from infection Outcome: Progressing Goal: Diagnostic test results will improve Outcome: Progressing Goal: Respiratory complications will improve Outcome: Progressing Goal: Cardiovascular complication will be avoided Outcome: Progressing   Problem: Elimination: Goal: Will not experience complications related to bowel motility Outcome: Progressing Goal: Will not experience complications related to urinary retention Outcome: Progressing   Problem: Skin Integrity: Goal: Risk for impaired skin integrity will decrease Outcome: Progressing

## 2018-04-07 NOTE — Progress Notes (Signed)
Patient found in bathroom after he got up out of bed without calling for assistance.  Reinforced to patient the importance of ringing for assistance with getting up due to risk of falling.  Patient states he is able to get up on his own and will do so when he wants to.  Will continue to monitor patient

## 2018-04-07 NOTE — Interval H&P Note (Signed)
History and Physical Interval Note:  04/07/2018 6:56 AM  [name]  has presented today for surgery, with the diagnosis of Abscess Right Leg  The various methods of treatment have been discussed with the patient and family. After consideration of risks, benefits and other options for treatment, the patient has consented to  Procedure(s): IRRIGATION AND DEBRIDEMENT ABSCESS RIGHT LEG (Right) as a surgical intervention .  The patient's history has been reviewed, patient examined, no change in status, stable for surgery.  I have reviewed the patient's chart and labs.  Questions were answered to the patient's satisfaction.     Newt Minion

## 2018-04-07 NOTE — Progress Notes (Deleted)
Patient arrives to room 5N15 at this time from PACU

## 2018-04-07 NOTE — Progress Notes (Signed)
This nurse entered patients room to give change of shift report to oncoming nurse.  Patient was sitting up on the side of the bed getting dressed and stated he was leaving if he did not get a new fentynl patch.  Patient was informed he would get a new patch at 9pm when it was ordered.  Patient became angry, would not get in the bed so the bed alarm could  Be engaged and stated he was leaving.  Charge nurse was called into the room and again attempted to explain to patient that he would get his patch at the ordered time and the md would be contacted for an order to give the patch early.  Patient did not like that he would have to wait, continued to get dressed and signed [place] paperwork.  His IV was removed and he left the facitily.

## 2018-04-07 NOTE — Transfer of Care (Addendum)
Immediate Anesthesia Transfer of Care Note  Patient: [name]  Procedure(s) Performed: IRRIGATION AND DEBRIDEMENT ABSCESS RIGHT LEG (Right )  Patient Location: PACU  Anesthesia Type:MAC  Level of Consciousness: awake  Airway & Oxygen Therapy: Patient Spontanous Breathing and Patient connected to nasal cannula oxygen  Post-op Assessment: Report given to RN and Post -op Vital signs reviewed and stable  Post vital signs: Reviewed and stable  Last Vitals:  Vitals Value Taken Time  BP 118/65 04/07/2018 10:02 AM  Temp    Pulse 69 04/07/2018 10:04 AM  Resp 17 04/07/2018 10:04 AM  SpO2 96 % 04/07/2018 10:04 AM  Vitals shown include unvalidated device data.  Last Pain:  Vitals:   04/07/18 0506  TempSrc: Oral  PainSc:       Patients Stated Pain Goal: 6 (28/41/32 4401)  Complications: No apparent anesthesia complications

## 2018-04-08 ENCOUNTER — Encounter (HOSPITAL_COMMUNITY): Payer: Self-pay | Admitting: Orthopedic Surgery

## 2018-04-08 NOTE — Discharge Summary (Signed)
Physician Discharge Summary  [name] CLE:[id number] DOB: 1946-03-05  PCP: [name], DO  Admit date: 04/01/2018 Discharge date: 04/08/2018   Patient left the hospital [place].   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  Admitted for cellulitis of right lower extremity.  Orthopedics consulted and initially recommended compression wrapping.  However on follow-up 8/22, noted 2 areas on his leg suspicious for abscesses which are actively draining pus.  Patient underwent I&D of right leg abscesses 8/23.  However the same evening, he apparently became angry because his fentanyl patch would not be changed earlier than scheduled and he would not wait until the nurses were able to reach the provider on call to place his patch early.  He left AMA before being seen by a provider or completing formal discharge paperwork.  Assessment & Plan:  Cellulitis of right lower extremity: Complicating diabetic insensate neuropathy, PAD, protein calorie malnutrition and venous insufficiency of right lower extremity.  Empirically started on IV ceftriaxone and vancomycin, continue.  Nutrition consultation appreciated.  Weightbearing as tolerated.  Of note patient has had chronic right leg swelling since  CABG in 2017 but current swelling and pain is worse than usual.  Right lower extremity venous Doppler negative for DVT or SVT.  MRSA PCR positive.  CT of right lower extremity shows findings suggestive of diffuse cellulitis without drainable fluid. Orthopedics/Dr. Sharol Given consulted and initially recommended compression wrapping.  However on my follow-up 8/22, noted 2 areas on his leg suspicious for abscesses which are actively draining pus.  Patient status post I&D of right leg abscesses on 8/23.    Patient left the hospital AMA.  CAD status post CABG, PAD: Asymptomatic of chest pain.    Was on Plavix.  Essential hypertension: Normotensive. Was on losartan and Lasix.    Type II DM: A1c 6.6 on 03/21/2018.  CBGs well-controlled.  Was on SSI and Tradjenta.  Chronic pain syndrome: Controlled on home regimen, continued.  Hyponatremia: Serum osmolarity 269, urine osmolarity 277.  Stable in the 129-131 range.  Anemia of chronic disease: Stable.  Outpatient follow-up and evaluation as deemed necessary.   Consultants:  Orthopedic  Procedures:  I&D of right leg abscesses 8/23   Discharge Instructions  None could be provided because patient left AMA before being seen by the provider.  Follow-up Information    Newt Minion, MD Follow up in 1 week(s).   Specialty:  Orthopedic Surgery Contact information: [address] [place] [address] [phone]          Allergies  Allergen Reactions  . Ivp Dye [Iodinated Diagnostic Agents]     Blood Pressure dropped----pt was pre-medicated with 13 hour prep and did fine with pre-meds--amy 03/09/11   . Penicillins Nausea Only    REACTION:  gi upset Has patient had a PCN reaction causing immediate rash, facial/tongue/throat swelling, SOB or lightheadedness with hypotension: No Has patient had a PCN reaction causing severe rash involving mucus membranes or skin necrosis: No Has patient had a PCN reaction that required  hospitalization: No Has patient had a PCN reaction occurring within the last 10 years: No If all of the above answers are "NO", then may proceed with Cephalosporin use.   . Sulfa Antibiotics Hives  . Sulfonamide Derivatives Hives and Itching  . Lisinopril Cough  . Statins Other (See Comments)    myalgias  . Propoxyphene N-Acetaminophen Other (See Comments)    Sharp pains- headache      Procedures/Studies: Dg Chest 2 View  Result Date: 04/03/2018 CLINICAL DATA:  Hypoxia EXAM: CHEST - 2 VIEW COMPARISON:  01/17/2018 FINDINGS: There is a calcified right apical pulmonary nodule likely reflecting sequela prior granulomatous disease. There is mild bilateral interstitial prominence. There is no focal consolidation. There is no pleural effusion or pneumothorax. The heart and mediastinal contours are unremarkable. There is evidence of prior CABG. No acute osseous abnormality. 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 IMPRESSION: No active cardiopulmonary disease. Electronically Signed   By: Kathreen Devoid   On: 04/03/2018 08:21   Ct Tibia Fibula Right Wo Contrast  Result Date: 04/04/2018 CLINICAL DATA:  Cellulitis with weeping wounds. Diabetes. Rule out abscess. EXAM: CT OF THE LOWER RIGHT EXTREMITY WITHOUT CONTRAST TECHNIQUE: Multidetector CT imaging of the right lower extremity was performed according to the standard protocol. COMPARISON:  None. FINDINGS: Bones/Joint/Cartilage The lower pole of the included patella is sharply demarcated along its medial aspect and the possibility of an age-indeterminate fracture or injury to the patella is not excluded, series 3/1. No displaced fracture fragment is identified in this may simply reflect normal anatomic variance. If the patient has pain referable to the lower pole the patella, consider knee radiographs with patellar views. The included distal femur, femoral condyles, tibial plateau, tibia and fibula are intact without fracture or  suspicious osseous lesions. Osteoarthritis of the ankle joint is small degenerative ossific densities project within posteromedial ankle joint. No joint effusion. The subtalar and midfoot articulations appear congruent. Partially included fixation screw at the base of the fifth metatarsal is seen without complicating features. Ligaments Suboptimally assessed by CT. Muscles and Tendons No intramuscular hemorrhage or mass. Mild diffuse fatty atrophy of the leg musculature. The tendons crossing the ankle joint are largely intact. Achilles tendon is normal morphology. The peritoneal, posteromedial and extensor tendons are unremarkable without evidence of tenosynovitis. Soft tissues No drainable fluid collection. Moderate diffuse soft tissue edema is noted along the entirety of the included knee, leg and ankle extending along including the dorsum of the included midfoot. Slightly more focal soft tissue swelling is identified anterior to the midshaft of tibia with subtle hypodense appearance of the subcutaneous soft tissues likely represent flocculent fluid. This measures approximately 3 x 2 x 1 cm. IMPRESSION: Diffuse cellulitis of the included right leg without drainable fluid collection. Subtle area of more focal soft tissue prominence and probable flocculent fluid is noted anterior to the midshaft of the tibia measuring 3 x 2 x 1 cm. No acute osseous abnormality. Electronically Signed   By: Ashley Royalty M.D.   On: 04/04/2018 15:44    The results of significant diagnostics from this hospitalization (including imaging, microbiology, ancillary and laboratory) are listed below for reference.     Microbiology: Recent Results (from the past 240 hour(s))  MRSA  PCR Screening     Status: Abnormal   Collection Time: 04/01/18 10:36 PM  Result Value Ref Range Status   MRSA by PCR POSITIVE (A) NEGATIVE Final    Comment: RESULT CALLED TO, READ BACK BY AND VERIFIED WITH: RN [name] [phone] @0154  [name] Performed at  [facility], [address], [place], [place] [address]      Labs: CBC: Recent Labs  Lab 04/01/18 1359 04/02/18 0252 04/03/18 0414 04/05/18 0321 04/06/18 0309  WBC 7.1 5.8 5.9 7.7 7.2  NEUTROABS 4.8  --   --   --   --   HGB 12.1* 11.4* 10.9* 11.4* 10.7*  HCT 37.5* 34.9* 33.3* 34.5* 33.0*  MCV 95.7 93.8 94.3 93.2 94.3  PLT 332 324 313 351 157   Basic Metabolic Panel: Recent Labs  Lab 04/02/18 0252 04/03/18 0414 04/04/18 0301 04/05/18 0321 04/06/18 0309  NA 130* 132* 131* 129* 131*  K 4.1 4.2 3.9 4.4 4.3  CL 97* 98 96* 93* 95*  CO2 22 26 28 27 27   GLUCOSE 87 104* 90 126* 113*  BUN 13 7* 8 9 11   CREATININE 0.80 0.72 0.83 0.76 0.69  CALCIUM 8.0* 8.1* 8.3* 8.1* 8.3*   Liver Function Tests: Recent Labs  Lab 04/01/18 1359 04/02/18 0252  AST 22 18  ALT 15 13  ALKPHOS 95 81  BILITOT 0.7 0.8  PROT 7.2 6.2*  ALBUMIN 3.1* 2.6*   BNP (last 3 results) No results for input(s): BNP in the last 8760 hours. Cardiac Enzymes: Recent Labs  Lab 04/01/18 2154 04/02/18 0252 04/02/18 0903  TROPONINI <0.03 <0.03 <0.03   CBG: Recent Labs  Lab 04/07/18 0642 04/07/18 0918 04/07/18 1010 04/07/18 1148 04/07/18 1635  GLUCAP 93 101* 81 111* 109*      SIGNED:  Vernell Leep, MD, FACP, FHM. [facility] Pager [phone]  If 7PM-7AM, please contact night-coverage [email or web address] Password [facility] 04/08/2018, 7:46 AM

## 2018-04-10 ENCOUNTER — Telehealth: Payer: Self-pay

## 2018-04-10 ENCOUNTER — Ambulatory Visit: Payer: PRIVATE HEALTH INSURANCE | Admitting: Family

## 2018-04-10 ENCOUNTER — Other Ambulatory Visit: Payer: Self-pay | Admitting: *Deleted

## 2018-04-10 DIAGNOSIS — E1151 Type 2 diabetes mellitus with diabetic peripheral angiopathy without gangrene: Secondary | ICD-10-CM

## 2018-04-10 DIAGNOSIS — L03115 Cellulitis of right lower limb: Secondary | ICD-10-CM

## 2018-04-10 DIAGNOSIS — F339 Major depressive disorder, recurrent, unspecified: Secondary | ICD-10-CM

## 2018-04-10 DIAGNOSIS — G894 Chronic pain syndrome: Secondary | ICD-10-CM

## 2018-04-10 NOTE — Telephone Encounter (Signed)
Patient does not qualify for a TCM call due to his diagnosis. He will only be able to have a hospital f/u visit completed

## 2018-04-10 NOTE — Telephone Encounter (Signed)
Edwena Felty with [facility] called to inform Dr.Carter that patient was seen in the [facility] 04/01/18-04/07/18 for RLE Cellulitis and left AMA.  Edwena Felty is very concerned due to comorbidities and the presence of Cellulitis. Edwena Felty thinks patient needs a TOC call/appointment and follow-up.  Edwena Felty indicated that if patient has needs in the home a referral to [facility] would be beneficial to collaborate care.  Please advise   Message routed to Clarise Cruz, Therapist, sports and Dr.Carter

## 2018-04-10 NOTE — Telephone Encounter (Signed)
Yes he needs hospital f/u; ok for [facility] referral

## 2018-04-10 NOTE — Telephone Encounter (Signed)
Thanks Clarise Cruz  I left message on voicemail requesting return call to schedule a hospital follow-up, I indicated that whom ever answers the phone can schedule and with any of the office [facility] providers  S.Chrae B/CMA

## 2018-04-10 NOTE — Patient Outreach (Signed)
[facility]) Care Management  04/10/2018   Naugle November 12, 1945 [id number]   Telephone Screen  Referral Date: 04/10/18 Referral Source: MD referral  Referral Reason: coordinate care  Insurance: next gen medicare and a united coal miners coverage  Outreach attempt #1 successful to his home number  Patient is able to verify HIPAA Reviewed and addressed referral to [facility] with patient Mr Ritzel confirms he left AMA from [facility] orthopedics after being admitted for cellulitis of the RLE and having an I& D completed by Dr Sharol Given on 04/07/18  Mr Rao reports leaving AMA related to not being able to get his fentyl patch updated as ordered by his pain management MD, Dr Kriste Basque [facility]). He stets he believes the MDs were "afraid to give it to me."  He reports not wanting to "withdraw from [facility] ever again" He takes fentyl for chronic pain. He reports having a fentyl patch at home but no one to bring it from his home.  He is aware of opoid regulations and voiced his concerns. CM allowed him to ventilate his feelings and then discussed the [place] regulations   RLE Mr Sposito reports his pain level of 0 today He has been cleaning and re dressed his RLE wound He reports it has "dried skin" without redness He reports the surgical sites are holes with some "leaking in a couple of places"   Social: Mr Pate lives alone He has medicare and a united Production designer, theatre/television/film. He has one living son whom he states lives in [place] at this time.  He has not heard from his son in "about a year" He reports his son has difficulty with the loss of his brother at Mr Mielke's home in 2017. Mr Wold doe have a friend Kalman Shan but she is able to provide only occasional care.  He reports he drove to the pharmacy today to pick up his fentyl patch and other meds at the pharmacy that were there prior to being hospitalized on 04/01/18  He ambulates with a cane but has at home an electric w/c he states does not work  but would like assist in getting it operational He has a standard w/c and a walker  Conditions: cellulitis of RLE with I& D, fall  1 week  prior to admission date of 04/01/18, HTN, PVD, protein calorie malnutrition and venous insufficiency of right lower extremity, DM type 2  (last listed Epic HgA1c was 6.6 on 03/21/18) unstable angina, sinusitis, GERD, DM neuropathy, Lumbar disc disease, fibromyalgia, osteoporosis, BPH, depression/anxiety, HLD, CAD status post CABG in 2017 with noted RLE swelling since  Medications:  He is not sure if he is on the correct medicines he should be having at d/c related leaving AMA.  He states if he needs to be on an antibiotic or other medication he would like to know and have them called into his pharmacy for pick up.  He denies concerns with taking medications as prescribed, affording medications, side effects of medications and questions about medications   Appointments: Mr Bancroft was encouraged to call Dr Sharol Given to request a hospital follow up appointment within 7-14 days and to check on need for antibiotics.  He states he would do so and was given the address and contact number for Dr Sharol Given [phone]) Mental health f/u appointment on Wednesday 04/12/18  GI [place] on 04/25/18   Advance Directives: He states he has living weill but needs to updated it since the death of  his son in 2017   Consent: [facility]N RN CM reviewed [facility] services with patient. Patient gave verbal consent for services Community RN CM and [facility] SW  Plan: [facility] RN CM will refer Mr Malerba to [place] RN CM (transition of care, recent fall, lives alone, disease management and [facility] SW (advance directives living will update and community resources (lives alone with poor support systems))  [facility] RN CM sent a successful outreach letter to Mr [facility] L. Lavina Hamman, RN, BSN, [place] Management Care Coordinator Direct Number [phone] Mobile number [phone]  Main THN number  [phone] Fax number [phone]

## 2018-04-10 NOTE — Telephone Encounter (Signed)
1.) Referral order pending for completion  2.) [name] please advise if a TOC call will be placed to a patient that left AMA, if not I will call to schedule hospital follow-up with Dinah for Thursday  S.Chrae B/CMA

## 2018-04-10 NOTE — Telephone Encounter (Signed)
Error

## 2018-04-11 NOTE — Telephone Encounter (Signed)
Patient returned call, appointment scheduled with Dinah for Thursday 04/13/18

## 2018-04-12 ENCOUNTER — Other Ambulatory Visit: Payer: Self-pay

## 2018-04-12 ENCOUNTER — Ambulatory Visit (INDEPENDENT_AMBULATORY_CARE_PROVIDER_SITE_OTHER): Payer: PRIVATE HEALTH INSURANCE | Admitting: Physician Assistant

## 2018-04-12 ENCOUNTER — Encounter: Payer: Self-pay | Admitting: *Deleted

## 2018-04-12 ENCOUNTER — Other Ambulatory Visit: Payer: Self-pay | Admitting: *Deleted

## 2018-04-12 ENCOUNTER — Encounter (INDEPENDENT_AMBULATORY_CARE_PROVIDER_SITE_OTHER): Payer: Self-pay | Admitting: Physician Assistant

## 2018-04-12 VITALS — Ht 72.0 in | Wt 210.0 lb

## 2018-04-12 DIAGNOSIS — L97919 Non-pressure chronic ulcer of unspecified part of right lower leg with unspecified severity: Secondary | ICD-10-CM

## 2018-04-12 DIAGNOSIS — E1142 Type 2 diabetes mellitus with diabetic polyneuropathy: Secondary | ICD-10-CM

## 2018-04-12 DIAGNOSIS — E08622 Diabetes mellitus due to underlying condition with other skin ulcer: Secondary | ICD-10-CM

## 2018-04-12 DIAGNOSIS — L97912 Non-pressure chronic ulcer of unspecified part of right lower leg with fat layer exposed: Secondary | ICD-10-CM

## 2018-04-12 DIAGNOSIS — I87311 Chronic venous hypertension (idiopathic) with ulcer of right lower extremity: Secondary | ICD-10-CM

## 2018-04-12 DIAGNOSIS — I83893 Varicose veins of bilateral lower extremities with other complications: Secondary | ICD-10-CM

## 2018-04-12 MED ORDER — DOXYCYCLINE HYCLATE 100 MG PO CAPS: 100.0000 mg | ORAL_CAPSULE | Freq: Two times a day (BID) | ORAL | 1 refills | Status: DC

## 2018-04-12 NOTE — Patient Outreach (Addendum)
[facility] [facility]) Care Management  04/12/2018  Cayetano Mikita 27-Apr-1946 [id number]   Care coordination  [facility] RN CM received a call from Raquel Sarna, [place], NP for Dr Sharol Given inquiring about home health aide Mr Winegarden did make and appointment as recommended by the [facility] RN CM to see Dr Sharol Given for hospital follow up and he arrived to this appointment 04/12/18 at [place] discussed [facility] was not an home health agency. Discussed that NP may inquire of the choice of home health agency from the patient and contact that home health agency or provide the agency an order/ referral for home health aide Discussed [place] encounter with Mr Cueto for Primary care MD referral and the [facility] staff members (Community RN CM and SW)  now assigned to contact Mr Karapetyan for further community coordination of care and SW needs. This THN RN CM asked Shawn, NP, to encourage Me [place] to respond to the contact calls from these [facility]  staff members  Shawn, NP appreciative of update and services being provided   [place] L. Lavina Hamman, RN, BSN, [place] Coordinator Office number [phone] Mobile number [phone]  Main THN number [phone] Fax number [phone]

## 2018-04-12 NOTE — Progress Notes (Addendum)
Office Visit Note   Patient: [name]           Date of Birth: [birth year]           MRN: [id number] Visit Date: 04/12/2018              Requested by: [name], [address], [place] [id number] PCP: [name], DO   Assessment & Plan: Visit Diagnoses:  1. 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 Diabetic polyneuropathy associated with type 2 diabetes mellitus ([place])   3. 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)   4. Varicose veins of bilateral lower extremities with other complications     Plan: Will start doxycycline 100 mg p.o. twice daily for the next couple of weeks.  Counseled the patient that he should resume compression stockings to the right lower extremity and he is going to try to purchase some new ones.  We also discussed protein supplementation and he is willing to try to get something compatible with his diabetes and is going to discuss this with the pharmacist when he picks up his medications.  He has taken Glucerna in the past but this may be difficult for him to purchase.  He does have quite limited social support system.  He has been contacted by the [facility] case management and will contact them to see if he can get some home health nursing care and perhaps even an aide if appropriate to try to assist with his wound care.  Patient was also counseled that if he develops recurrent fever or chills, pain not controlled by his current pain medication or other symptoms of recurrent infection that he should return to the hospital. ADDEN: Have ordered [facility] for dressing changes twice weekly with silver collagen and 3 layer compression dressings.  Follow-Up Instructions: Return in about 2 weeks (around 04/26/2018).   Orders:  No orders of the defined types were placed in this encounter.  Meds ordered this encounter  Medications  .  doxycycline (VIBRAMYCIN) 100 MG capsule    Sig: Take 1 capsule (100 mg total) by mouth 2 (two) times daily.    Dispense:  28 capsule    Refill:  1      Procedures: No procedures performed   Clinical Data: No additional findings.   Subjective: Chief Complaint  Patient presents with  . Right Leg - Routine Post Op    04/07/18 I&D abscess     HPI The patient is a 72 year old male who presents for postoperative follow-up following I&D of right lower extremity abscesses with local tissue rearrangement for wound closure on 04/07/2018.  He reports he initially sustained some trauma when he fell against some concrete steps injuring the anterior aspect of his right calf.  While hospitalized, it was recommended that he be placed in a short-term skilled nursing facility but the patient apparently left the hospital AMA.  He was upset because he he was unable to get fentanyl patches reordered and he has been on these chronically for chronic pain issues. Since his discharge home he has been washing the right lower extremity and applying a clean cotton sock but has not been elevating the right lower extremity or wearing any compression.  He is not currently taking any antibiotics.  He does drive and drove himself to the office visit today.  He reports that he has not been  taking any additional protein supplements and eats a lot of microwave meals as he does not cook much for himself.  He has taken Glucerna in the past. He reports he has a little sensation in either of his feet and has had chronic ulcerations off and on of both feet treated by podiatry.  He reports being on multiple courses of antibiotics in the past for infection.    Review of Systems   Objective: Vital Signs: Ht 6' (1.829 m)   Wt 210 lb (95.3 kg)   BMI 28.48 kg/m   Physical Exam Patient is a elderly male who ambulates with a antalgic appearing gait with a cane.  He is well-groomed well-nourished and well-developed.  He is in no  apparent distress.  He has bilateral extra-depth shoes with custom orthotics. Ortho Exam On examination of the right lower extremity he has 2 incisions over the anterior calf area with sutures intact.  Over the distal incision just medial to the distal incision there is a 2 cm open ulcer with about 3 mm of depth.  There is some yellow fibrous tissue within the wound bed.  Scant drainage no odor.  There is no peri-incisional cellulitis but the entire right calf is quite edematous with pitting edema noted.  He has good dorsalis pedis pulse.  He has decreased sensation in the stocking distribution in both lower extremities.  There is a 2 cm diameter right lateral heel ulceration with approximately 3 mm of depth no bone or tendon visible.  He has severe varicosities right lower extremity greater than left lower extremity with hemosiderin staining of both lower extremities.  Specialty Comments:  No specialty comments available.  Imaging: No results found.   PMFS History: Patient Active Problem List   Diagnosis Date Noted  . Abscess of right lower leg   . Severe protein-calorie malnutrition ([employer])   . 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 Diabetic polyneuropathy associated with type 2 diabetes mellitus ([place])   . Cellulitis 04/01/2018  . Cellulitis of right lower extremity 04/01/2018  . Tachycardia 04/01/2018  . 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 Unstable angina ([place]) 09/20/2015  . Chest pain, rule out acute myocardial infarction 09/18/2015  . Diabetes mellitus type 2 with peripheral artery disease ([place]) 09/18/2015  . Hyponatremia 09/18/2015  . Chest pain on exertion 09/18/2015  . Varicose veins of bilateral lower extremities with other complications 97/35/3299  . Occlusion and stenosis of carotid artery without mention of cerebral infarction 10/18/2011  . SINUSITIS, ACUTE 03/13/2010  . BACK PAIN 11/13/2009  . FATIGUE 10/17/2009  . Diabetic neuropathy  associated with diabetes mellitus due to underlying condition ([place]) 08/29/2009  . Vitamin D deficiency 08/29/2009  . Hyposmolality and/or hyponatremia 08/29/2009  . VISUAL ACUITY, DECREASED, LEFT EYE 08/29/2009  . PERIPHERAL VASCULAR DISEASE 08/29/2009  . SINUSITIS, CHRONIC 08/29/2009  . NEPHROLITHIASIS 08/29/2009  . FIBROMYALGIA 08/29/2009  . BICEPS TENDON RUPTURE, RIGHT 08/11/2009  . DIABETES MELLITUS, TYPE II 08/05/2009  . Hyperlipidemia 08/05/2009  . ANEMIA-NOS 08/05/2009  . ANXIETY 08/05/2009  . DEPRESSION 08/05/2009  . Chronic pain syndrome 08/05/2009  . Essential hypertension 08/05/2009  . GERD 08/05/2009  . BENIGN PROSTATIC HYPERTROPHY 08/05/2009  . [place] DISEASE, LUMBAR 08/05/2009  . Osteoporosis 08/05/2009   Past Medical History:  Diagnosis Date  . Anemia    NOS  . Anxiety   . 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 BPH (benign  prostatic hypertrophy)   . Broken foot Oct. 12, 2013   Right foot Fx  . CAD (coronary artery disease)   . Cellulitis and abscess of leg 03/2018   right leg  . Chronic hyponatremia   . Chronic pain syndrome   . COPD (chronic obstructive pulmonary disease) ([place])   . Depression   . Diabetes mellitus   . Diabetic foot ulcer ([place])   . DJD (degenerative joint disease)    L wrist  . DM2 (diabetes mellitus, type 2) ([place])   . GERD (gastroesophageal reflux disease)   . HLD (hyperlipidemia)   . HTN (hypertension)   . Lumbar disc disease   . Osteoporosis    tx per [facility], Dr Prudencio Burly, thought due to heavy steriod use after 1978  . Spine fracture    hx, multiple  . Varicose veins     Family History  Problem Relation Age of Onset  . Lung cancer Father 77  . Diabetes Mother   . Osteoporosis Mother   . Throat cancer Unknown        family hx - "bad living" - also lung CA, heart disease and renal failure    . Diabetes Sister   . Suicidality Son 76  . Lung cancer Maternal Grandmother   . Diabetes Maternal Grandfather   . Heart  attack Paternal Grandmother     Past Surgical History:  Procedure Laterality Date  . CARDIAC CATHETERIZATION N/A 09/19/2015   Procedure: Left Heart Cath and Coronary Angiography;  Surgeon: Adrian Prows, MD;  Location: [place] [facility];  Service: Cardiovascular;  Laterality: N/A;  . CARPAL TUNNEL RELEASE    . CORONARY ARTERY BYPASS GRAFT N/A 09/22/2015   Procedure: CORONARY ARTERY BYPASS GRAFTING (CABG) times four using the right greater saphenous vein harvested endoscopically and the left internal mammary artery.  LIMA-LAD, SEQ SVG-DIAG & OM, SVG-PD.;  Surgeon: [name], MD;  Location: [place];  Service: Open Heart Surgery;  Laterality: N/A;  . EYE SURGERY    . I&D EXTREMITY Right 04/07/2018   Procedure: IRRIGATION AND DEBRIDEMENT ABSCESS RIGHT LEG;  Surgeon: Newt Minion, MD;  Location: [place];  Service: Orthopedics;  Laterality: Right;  . L foot open repair jones fracture  2010    5th metetarsal   . left ankle ganglion cyst  1976  . left cataract  1996   right - 1997  . left CTS  2006   R CTS - 2007  . left foot surgery  1998   R surgery/fracture - 1999  . left wrist/hand fusion  2008  . [place], 2004  . NASAL SINUS SURGERY     multiple- x8. last 1997 with obliteration  . REPAIR THORACIC AORTA  2017  . right hand fracture  1969  . ROTATOR CUFF REPAIR  2006   R, than repeat 2011, Dr Theda Sers  . TRIGGER FINGER RELEASE Left 11/11/2014   Procedure: LEFT LONG FINGER RELEASE TRIGGER FINGER/A-1 PULLEY;  Surgeon: Milly Jakob, MD;  Location: [facility];  Service: Orthopedics;  Laterality: Left;  . vocal surgery  1996   Social History   Occupational History  . Not on file  Tobacco Use  . Smoking status: Former Smoker    Packs/day: 1.50    Years: 30.00    Pack years: 45.00    Types: Cigarettes    Last attempt to quit: 11/06/1988    Years since quitting: 29.4  . Smokeless tobacco: Never Used  Substance  and Sexual Activity  . Alcohol use: Not  Currently    Comment: None in 30 years but drank beer before  . Drug use: No  . Sexual activity: Not Currently

## 2018-04-12 NOTE — Patient Outreach (Signed)
[place] [facility]) Care Management  04/12/2018  [name] 04/15/1946 [id number]  Referral received 8/27  TOC post hospital discharged 04/07/2018  RN spoke with pt today and introduction the purpose for today's call. Pt very receptive and explained his recent hospital discharge. RN completed the TOC and initial assessment. Discussed pt's current issues related to his LE cellulitis with ongoing wound care. Pt states he visited his orthopedic provider today who will be ordering wound care to address his ongoing cellulitis. In addition to adding an antibiotic. RN has informed to tp take all of this medications unless otherwise informed by his provider.    HTN- Pt reports his BP is good with no major issues and he checks with his home BP device when needed.  DM- States his BS range from 90-120 and his A1C is currently 6.6 with no issues to address at this time. NUTRITION: Pt admits he does not eat much at well over 6 ft tall weighing around 210-220 lbs but has lost a lot of weight being on the fluid medication while in the hospital.  TRANSPORTATION- Pt states he currently continues to drive but may need assistance later with transportation needs. RN introduced [facility] services and the process for obtaining this services via application request. Pt has been information when he is in need of transportation assistance to contact [facility] or inform his provider's office to notify [facility] if he is no long active with [facility] services. RN offered application now however pt opt to decline.   Based upon today's conversation RN discussed a plan of care and generated goals and interventions related. Pt receptive as Therapist, sports offered a home visit to further engage. RN will scheduled a home visit and alert provider of pt's disposition with [facility] services for community disease management services.  Patient was recently discharged from hospital and all medications have been reviewed.  [name]     Most  Recent Value  Care Plan Problem One  Hospital prevention related to cellulitis  Role Documenting the Problem One  Care Management [place] for Problem One  Active  THN Long Term Goal   Pt will not have a readmission related to his wounds over the next 60 days.  THN Long Term Goal Start Date  04/12/18  Interventions for Problem One Long Term Goal  Discussed the improtance of pt being receptivt to the pending wound care regimen for pt's ongoing cellulitis to his LE. Will strongly encouraged pt ot elevate his legs and report any signs or worsening pain or discomfort or appearance in drainage color or odor.  THN CM Short Term Goal #1   Medication adherence post op discharge over the next 30 days.  THN CM Short Term Goal #1 Start Date  04/12/18  Interventions for Short Term Goal #1  Pt will take all prescribed medications as recommended to lower the risk of ongoing infections and promotion ongoing healing to his LE.  THN CM Short Term Goal #2   Medical appointments adherence post-op discharge over the next 30 days.  THN CM Short Term Goal #2 Start Date  04/05/18  Interventions for Short Term Goal #2  Will strongly encouraged attendance to all scheduled appointments and verified pt has sufficient transportation. Will discussed the importance of attending due to his cellulitis and possible changes in his medications or his ongoig infection.       [name], RN Care Management Coordinator [place] Office [phone]

## 2018-04-12 NOTE — Patient Outreach (Signed)
[place] [facility]) Care Management  04/12/2018  [name] 12-15-1945 [id number]  BSW attempted to contact the patient on today's date to conduct a community resource consult. Unfortunately, today's call was unsuccessful. BSW left the patient a HIPAA compliant voice message requesting a return call.   Plan: BSW will mail the patient an unsuccessful outreach letter. BSW will attempt the patient again within the next four business days.  Daneen Schick, BSW, CDP [facility] [phone]

## 2018-04-13 ENCOUNTER — Ambulatory Visit (INDEPENDENT_AMBULATORY_CARE_PROVIDER_SITE_OTHER): Payer: PRIVATE HEALTH INSURANCE | Admitting: Family

## 2018-04-13 ENCOUNTER — Encounter: Payer: Self-pay | Admitting: Family

## 2018-04-13 ENCOUNTER — Other Ambulatory Visit (INDEPENDENT_AMBULATORY_CARE_PROVIDER_SITE_OTHER): Payer: Self-pay | Admitting: Radiology

## 2018-04-13 VITALS — BP 130/60 | HR 73 | Temp 98.2°F | Resp 16 | Ht 72.0 in | Wt 211.6 lb

## 2018-04-13 DIAGNOSIS — E1142 Type 2 diabetes mellitus with diabetic polyneuropathy: Secondary | ICD-10-CM

## 2018-04-13 DIAGNOSIS — G894 Chronic pain syndrome: Secondary | ICD-10-CM | POA: Diagnosis not present

## 2018-04-13 DIAGNOSIS — I10 Essential (primary) hypertension: Secondary | ICD-10-CM

## 2018-04-13 DIAGNOSIS — E1151 Type 2 diabetes mellitus with diabetic peripheral angiopathy without gangrene: Secondary | ICD-10-CM

## 2018-04-13 DIAGNOSIS — L853 Xerosis cutis: Secondary | ICD-10-CM

## 2018-04-13 DIAGNOSIS — H6123 Impacted cerumen, bilateral: Secondary | ICD-10-CM

## 2018-04-13 DIAGNOSIS — L97912 Non-pressure chronic ulcer of unspecified part of right lower leg with fat layer exposed: Principal | ICD-10-CM

## 2018-04-13 DIAGNOSIS — L03115 Cellulitis of right lower limb: Secondary | ICD-10-CM | POA: Diagnosis not present

## 2018-04-13 DIAGNOSIS — E08622 Diabetes mellitus due to underlying condition with other skin ulcer: Secondary | ICD-10-CM

## 2018-04-13 MED ORDER — CARBAMIDE PEROXIDE 6.5 % OT SOLN: 5.0000 [drp] | Freq: Two times a day (BID) | OTIC | 0 refills | Status: AC

## 2018-04-13 MED ORDER — AQUAPHOR EX OINT: TOPICAL_OINTMENT | Freq: Two times a day (BID) | CUTANEOUS | 0 refills | Status: DC

## 2018-04-13 NOTE — Patient Instructions (Addendum)
1. Instil debrox 6.5 % otic solution 5 drops into both ears twice daily x 4 days then follow up at [facility] office for ear lavage on 04/20/2018  2. Apply Aquaphor to both legs twice daily for dry skin   3. Notify provider's office for any redness,increased drainage from right leg wound, fever or chills

## 2018-04-13 NOTE — Progress Notes (Signed)
Provider: Corben Auzenne FNP-C  [name], DO  Patient Care Team: [name], DO as PCP - General (Internal Medicine) Serafina Mitchell, MD as Consulting Physician (Vascular Surgery) Adrian Prows, MD as Consulting Physician (Cardiology) Jana Half, DPM as Consulting Physician (Podiatry) Newt Minion, MD as Consulting Physician (Orthopedic Surgery) Kriste Basque, MD as Referring Physician (Pain Medicine) Tobi Bastos, RN as [place] Management Daneen Schick as [place] Management  Extended Emergency Contact Information Primary Emergency Contact: Cabal,Jason Address: [address]          [place], [place] [address] [place] Phone: [phone] Relation: Son Secondary Emergency Contact: Silvestre Gunner [place] of [place] Phone: [phone] Mobile Phone: [phone] Relation: Friend   Goals of care: Advanced Directive information Advanced Directives 04/10/2018  Does Patient Have a Medical Advance Directive? Yes  Type of Paramedic of [place];Living will  Would patient like information on creating a medical advance directive? -     Chief Complaint  Patient presents with  . Hospitalization Follow-up    HPI:  Pt is a 72 y.o. male seen today at [facility] for an acute visit for hospital follow up.He is status post hospital admission from   04/01/2018-04/08/2018 for right leg cellulitis after sustaining a fall on a concrete step and scrapped his leg a week prior to admission resulting to redness and swelling of the leg.Orthopedic was consulted and initially recommended compression wrapping but on follow up he was noted to have two areas on right leg which was suspicious for abscesses which was draining pus.He underwent right leg I &D on 04/07/2018.He was treated with I.V ceftriaxone and vancomycin during the course of admission.however he left the Hospital AMA  after getting angry due because his fentanyl patch would not be changed earlier than scheduled he would not wait for the Nurse to reach the on call provider to place patch early. He states he was seen by Tomasa Blase  Today was started on Doxycycline 100 mg tablet twice daily.He has another upcoming follow up appointment 04/26/2018.He has stitches to right shin area to be removed by Orthopedic.He states has a Nurse coming on 04/18/2018 to change wound dressing.Unclear if Nurse is from Home health or [facility].  Of note he has a medical history of HTN,CAD post CABG,Type 2 DM,Hyperlipidemia,chronic pain among other conditions. He denies any acute issues during visit.He denies any fever or chills. He states CBG readings ranging in the 90's-120's.       Past Medical History:  Diagnosis Date  . Anemia    NOS  . Anxiety   . 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 BPH (benign prostatic hypertrophy)   . Broken foot Oct. 12, 2013   Right foot Fx  . CAD (coronary artery disease)   . Cellulitis and abscess of leg 03/2018   right leg  . Chronic hyponatremia   . Chronic pain syndrome   . COPD (chronic obstructive pulmonary disease) ([place])   . Depression   . Diabetes mellitus   . Diabetic foot ulcer ([place])   . DJD (degenerative joint disease)    L wrist  . DM2 (diabetes mellitus, type 2) ([place])   . GERD (gastroesophageal reflux disease)   . HLD (hyperlipidemia)   . HTN (hypertension)   . Lumbar disc disease   . Osteoporosis    tx per [facility], Dr Prudencio Burly, thought due  to heavy steriod use after 1978  . Spine fracture    hx, multiple  . Varicose veins    Past Surgical History:  Procedure Laterality Date  . CARDIAC CATHETERIZATION N/A 09/19/2015   Procedure: Left Heart Cath and Coronary Angiography;  Surgeon: Adrian Prows, MD;  Location: [place] [facility];  Service: Cardiovascular;  Laterality: N/A;  . CARPAL TUNNEL RELEASE    . CORONARY ARTERY BYPASS GRAFT N/A 09/22/2015   Procedure: CORONARY  ARTERY BYPASS GRAFTING (CABG) times four using the right greater saphenous vein harvested endoscopically and the left internal mammary artery.  LIMA-LAD, SEQ SVG-DIAG & OM, SVG-PD.;  Surgeon: [name], MD;  Location: [place];  Service: Open Heart Surgery;  Laterality: N/A;  . EYE SURGERY    . I&D EXTREMITY Right 04/07/2018   Procedure: IRRIGATION AND DEBRIDEMENT ABSCESS RIGHT LEG;  Surgeon: Newt Minion, MD;  Location: [place];  Service: Orthopedics;  Laterality: Right;  . L foot open repair jones fracture  2010    5th metetarsal   . left ankle ganglion cyst  1976  . left cataract  1996   right - 1997  . left CTS  2006   R CTS - 2007  . left foot surgery  1998   R surgery/fracture - 1999  . left wrist/hand fusion  2008  . [place], 2004  . NASAL SINUS SURGERY     multiple- x8. last 1997 with obliteration  . REPAIR THORACIC AORTA  2017  . right hand fracture  1969  . ROTATOR CUFF REPAIR  2006   R, than repeat 2011, Dr Theda Sers  . TRIGGER FINGER RELEASE Left 11/11/2014   Procedure: LEFT LONG FINGER RELEASE TRIGGER FINGER/A-1 PULLEY;  Surgeon: Milly Jakob, MD;  Location: [place];  Service: Orthopedics;  Laterality: Left;  . vocal surgery  1996    Allergies  Allergen Reactions  . Ivp Dye [Iodinated Diagnostic Agents]     Blood Pressure dropped----pt was pre-medicated with 13 hour prep and did fine with pre-meds--amy 03/09/11   . Penicillins Nausea Only    REACTION: gi upset Has patient had a PCN reaction causing immediate rash, facial/tongue/throat swelling, SOB or lightheadedness with hypotension: No Has patient had a PCN reaction causing severe rash involving mucus membranes or skin necrosis: No Has patient had a PCN reaction that required hospitalization: No Has patient had a PCN reaction occurring within the last 10 years: No If all of the above answers are "NO", then may proceed with Cephalosporin use.   . Sulfa Antibiotics Hives  .  Sulfonamide Derivatives Hives and Itching  . Lisinopril Cough  . Statins Other (See Comments)    myalgias  . Propoxyphene N-Acetaminophen Other (See Comments)    Sharp pains- headache    Outpatient Encounter Medications as of 04/13/2018  Medication Sig  . ACCU-CHEK FASTCLIX LANCETS MISC Use as directed to check blood glucose. Dx: E11.51  . Blood Glucose Monitoring Suppl (ACCU-CHEK AVIVA PLUS) w/Device KIT Use as directed to check blood glucose daily. Dx: E11.51  . buPROPion (WELLBUTRIN XL) 300 MG 24 hr tablet Take 300 mg by mouth every morning.   . celecoxib (CELEBREX) 200 MG capsule TAKE 1 CAPSULE(200 MG) BY MOUTH TWICE DAILY  . Cholecalciferol (VITAMIN D) 1000 UNITS capsule Take 5,000 Units by mouth daily.   . clonazePAM (KLONOPIN) 0.5 MG tablet Take 1 mg by mouth daily at 12 noon.   . clopidogrel (PLAVIX) 75 MG tablet Take 75  mg by mouth daily.  . cyclobenzaprine (FLEXERIL) 10 MG tablet Take 10 mg by mouth 3 (three) times daily as needed for muscle spasms.   Marland Kitchen desonide (DESOWEN) 0.05 % cream Apply 1 application topically daily as needed (Roseca).  . doxycycline (VIBRAMYCIN) 100 MG capsule Take 1 capsule (100 mg total) by mouth 2 (two) times daily.  . fentaNYL (DURAGESIC - DOSED MCG/HR) 50 MCG/HR Place 50 mcg onto the skin every 3 (three) days.  . furosemide (LASIX) 20 MG tablet Take 20 mg by mouth as directed. 20 mg every AM and 58m every PM  . gabapentin (NEURONTIN) 400 MG capsule Take 800 mg by mouth at bedtime.   .Marland Kitchenglucose blood (ACCU-CHEK AVIVA PLUS) test strip 1 each by Other route as needed for other. Use as instructed. Dx: E11.51  . HYDROmorphone (DILAUDID) 2 MG tablet Take 2 mg by mouth every 6 (six) hours as needed for severe pain.   .Marland KitchenJANUVIA 100 MG tablet TAKE 1 TABLET BY MOUTH EVERY DAY  . losartan (COZAAR) 25 MG tablet Take 25 mg by mouth daily.  . Multiple Vitamins-Iron (MULTIVITAMINS WITH IRON) TABS tablet Take 1 tablet by mouth daily.  . nitroGLYCERIN (NITROSTAT) 0.4 MG  SL tablet Place 0.4 mg under the tongue every 5 (five) minutes as needed for chest pain.  .Marland Kitchenomeprazole (PRILOSEC) 40 MG capsule Take 1 capsule (40 mg total) by mouth daily. (Patient taking differently: Take 40 mg by mouth 2 (two) times daily. )  . potassium chloride (K-DUR) 10 MEQ tablet Take 10 mEq by mouth 2 (two) times daily.  .Marland KitchenPROAIR HFA 108 (90 BASE) MCG/ACT inhaler Inhale 2 puffs into the lungs every 6 (six) hours as needed.   .Marland KitchenrOPINIRole (REQUIP) 1 MG tablet Take 2 mg by mouth 3 times/day as needed-between meals & bedtime (restless legs).   . tamsulosin (FLOMAX) 0.4 MG CAPS capsule Take 1 capsule (0.4 mg total) by mouth 2 (two) times daily.  .Marland KitchenVITAMIN E PO Take 8,000 Units by mouth daily.  .Marland Kitchenzonisamide (ZONEGRAN) 50 MG capsule TAKE 3 CAPSULES(150 MG) BY MOUTH AT BEDTIME  . carbamide peroxide (DEBROX) 6.5 % OTIC solution Place 5 drops into both ears 2 (two) times daily for 4 days.  . mineral oil-hydrophilic petrolatum (AQUAPHOR) ointment Apply topically 2 (two) times daily. Apply to lower extremities twice daily for dry skin until resolved.  . [DISCONTINUED] mupirocin ointment (BACTROBAN) 2 % Place 1 application into the nose 2 (two) times daily. For nasal infection x 7 days (Patient not taking: Reported on 04/13/2018)   No facility-administered encounter medications on file as of 04/13/2018.     Review of Systems  Constitutional: Negative for appetite change, chills, fatigue and fever.  HENT: Negative for congestion, rhinorrhea, sinus pressure, sinus pain, sneezing and sore throat.   Eyes: Negative for discharge, redness and itching.  Respiratory: Negative for cough, chest tightness, shortness of breath and wheezing.   Cardiovascular: Positive for leg swelling. Negative for chest pain and palpitations.  Gastrointestinal: Negative for abdominal distention, abdominal pain, constipation, diarrhea, nausea and vomiting.  Endocrine: Negative for cold intolerance, heat intolerance, polydipsia,  polyphagia and polyuria.  Genitourinary: Negative for dysuria, flank pain, frequency and urgency.  Musculoskeletal: Positive for arthralgias and gait problem.  Skin: Positive for wound. Negative for color change, pallor and rash.       Right leg incision with stitches   Neurological: Negative for dizziness, light-headedness and headaches.  Hematological: Does not bruise/bleed easily.  Psychiatric/Behavioral: Negative for  agitation and sleep disturbance. The patient is not nervous/anxious.     Immunization History  Administered Date(s) Administered  . Influenza Whole 06/16/2009  . Influenza, High Dose Seasonal PF 05/20/2017  . Pneumococcal Conjugate-13 09/14/2013  . Pneumococcal Polysaccharide-23 08/17/2007, 08/16/2014  . Td 10/17/2009   Pertinent  Health Maintenance Due  Topic Date Due  . OPHTHALMOLOGY EXAM  04[birth year]08/1956  . COLONOSCOPY  04/08/1996  . INFLUENZA VACCINE  03/16/2018  . HEMOGLOBIN A1C  09/21/2018  . FOOT EXAM  03/29/2019  . PNA vac Low Risk Adult  Completed   Fall Risk  04/12/2018 03/21/2018 01/17/2018 12/21/2017 12/16/2015  Falls in the past year? Yes Yes Yes Yes Yes  Number falls in past yr: 2 or more 2 or more 1 2 or more 1  Injury with Fall? Yes No - No No  Risk for fall due to : Impaired balance/gait;History of fall(s) - - - History of fall(s);Impaired balance/gait;Medication side effect  Follow up Falls prevention discussed - - - -    Vitals:   04/13/18 1300  BP: 130/60  Pulse: 73  Resp: 16  Temp: 98.2 F (36.8 C)  TempSrc: Oral  SpO2: 98%  Weight: 211 lb 9.6 oz (96 kg)  Height: 6' (1.829 m)   Body mass index is 28.7 kg/m. Physical Exam  Constitutional: He is oriented to person, place, and time. He appears well-developed and well-nourished. No distress.  Elderly in no acute distress   HENT:  Head: Normocephalic.  Mouth/Throat: Oropharynx is clear and moist. No oropharyngeal exudate.  Bilateral ear cerumen impaction unable to visualize TM   Eyes: Pupils  are equal, round, and reactive to light. Conjunctivae and EOM are normal. Right eye exhibits no discharge. Left eye exhibits no discharge. No scleral icterus.  Eye glasses in place   Neck: Normal range of motion. No JVD present. No thyromegaly present.  Cardiovascular: Normal rate, regular rhythm, normal heart sounds and intact distal pulses. Exam reveals no gallop and no friction rub.  No murmur heard. Pulmonary/Chest: Effort normal and breath sounds normal. No respiratory distress. He has no wheezes. He has no rales.  Abdominal: Soft. Bowel sounds are normal. He exhibits no distension and no mass. There is no tenderness. There is no rebound and no guarding.  Musculoskeletal: Normal range of motion. He exhibits no tenderness.  Right leg non-pitting edema.  Lymphadenopathy:    He has no cervical adenopathy.  Neurological: He is oriented to person, place, and time.  Skin: Skin is warm and dry. No rash noted. No erythema. No pallor.  Right leg upper shin area incision site with stitches dry,clean and intact.quarter size open wound next to incision site.wound bed with 10% yellow skin tissue but beefy red in color.surrounding skin without any signs of infections.  Bilateral lower extremities skin dry and scaly.  Psychiatric: He has a normal mood and affect. His speech is normal and behavior is normal. Judgment and thought content normal.  Vitals reviewed.   Labs reviewed: Recent Labs    04/04/18 0301 04/05/18 0321 04/06/18 0309  NA 131* 129* 131*  K 3.9 4.4 4.3  CL 96* 93* 95*  CO2 28 27 27   GLUCOSE 90 126* 113*  BUN 8 9 11   CREATININE 0.83 0.76 0.69  CALCIUM 8.3* 8.1* 8.3*   Recent Labs    12/01/17 03/21/18 0810 04/01/18 1359 04/02/18 0252  AST 23 18 22 18   ALT 15 11 15 13   ALKPHOS 70  --  95 81  BILITOT  --  0.7 0.7 0.8  PROT  --  7.1 7.2 6.2*  ALBUMIN  --   --  3.1* 2.6*   Recent Labs    04/01/18 1359  04/03/18 0414 04/05/18 0321 04/06/18 0309  WBC 7.1   < > 5.9 7.7  7.2  NEUTROABS 4.8  --   --   --   --   HGB 12.1*   < > 10.9* 11.4* 10.7*  HCT 37.5*   < > 33.3* 34.5* 33.0*  MCV 95.7   < > 94.3 93.2 94.3  PLT 332   < > 313 351 342   < > = values in this interval not displayed.   Lab Results  Component Value Date   TSH 2.388 09/25/2015   Lab Results  Component Value Date   HGBA1C 6.6 (H) 03/21/2018   Lab Results  Component Value Date   CHOL 139 03/21/2018   HDL 37 (L) 03/21/2018   LDLCALC 86 03/21/2018   LDLDIRECT 108.2 03/13/2010   TRIG 71 03/21/2018   CHOLHDL 3.8 03/21/2018    Significant Diagnostic Results in last 30 days:  Dg Chest 2 View  Result Date: 04/03/2018 CLINICAL DATA:  Hypoxia EXAM: CHEST - 2 VIEW COMPARISON:  01/17/2018 FINDINGS: There is a calcified right apical pulmonary nodule likely reflecting sequela prior granulomatous disease. There is mild bilateral interstitial prominence. There is no focal consolidation. There is no pleural effusion or pneumothorax. The heart and mediastinal contours are unremarkable. There is evidence of prior CABG. No acute osseous abnormality. 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 IMPRESSION: No active cardiopulmonary disease. Electronically Signed   By: Kathreen Devoid   On: 04/03/2018 08:21   Ct Tibia Fibula Right Wo Contrast  Result Date: 04/04/2018 CLINICAL DATA:  Cellulitis with weeping wounds. Diabetes. Rule out abscess. EXAM: CT OF THE LOWER RIGHT EXTREMITY WITHOUT CONTRAST TECHNIQUE: Multidetector CT imaging of the right lower extremity was performed according to the standard protocol. COMPARISON:  None. FINDINGS: Bones/Joint/Cartilage The lower pole of the included patella is sharply demarcated along its medial aspect and the possibility of an age-indeterminate fracture or injury to the patella is not excluded, series 3/1. No displaced fracture fragment is identified in this may simply reflect normal anatomic variance. If the patient has pain referable to the lower pole  the patella, consider knee radiographs with patellar views. The included distal femur, femoral condyles, tibial plateau, tibia and fibula are intact without fracture or suspicious osseous lesions. Osteoarthritis of the ankle joint is small degenerative ossific densities project within posteromedial ankle joint. No joint effusion. The subtalar and midfoot articulations appear congruent. Partially included fixation screw at the base of the fifth metatarsal is seen without complicating features. Ligaments Suboptimally assessed by CT. Muscles and Tendons No intramuscular hemorrhage or mass. Mild diffuse fatty atrophy of the leg musculature. The tendons crossing the ankle joint are largely intact. Achilles tendon is normal morphology. The peritoneal, posteromedial and extensor tendons are unremarkable without evidence of tenosynovitis. Soft tissues No drainable fluid collection. Moderate diffuse soft tissue edema is noted along the entirety of the included knee, leg and ankle extending along including the dorsum of the included midfoot. Slightly more focal soft tissue swelling is identified anterior to the midshaft of tibia with subtle hypodense appearance of the subcutaneous soft tissues likely represent flocculent fluid. This measures approximately 3 x 2 x 1 cm. IMPRESSION: Diffuse cellulitis of the included right leg without drainable fluid collection. Subtle area of more focal soft tissue prominence and probable  flocculent fluid is noted anterior to the midshaft of the tibia measuring 3 x 2 x 1 cm. No acute osseous abnormality. Electronically Signed   By: [place] Royalty M.D.   On: 04/04/2018 15:44    Assessment/Plan 1. Cellulitis of right leg Afebrile.status post hospital admission post I&D by orthopedic as above.Left the hospital AMA.seen by orthopedic post hospital admission and doxycyline 100 mg tablet was prescribed.Has up coming appointment with Orthopedic 04/26/2018 for removal of stitches.Also states [facility] nurse  visit on 04/18/2018 for dressing change for right leg ulcer.    - CBC with Differential/Platelets  2. Diabetes mellitus type 2 with peripheral artery disease   Lab Results  Component Value Date   HGBA1C 6.6 (H) 03/21/2018   CBG log readings in the 90's-120's.continue on Januvia 100 mg tablet daily.on Plavix and losartan.on Gabapentin 800 mg capsule at bedtime for neuropathy. Up to date with annual foot exam.Awaiting Ophthalmology eye exam.continue on low carbohydrate heart healthy diet.   3. Essential hypertension B/p stable.continue on losartan 25 mg tablet daily and furosemide 20 mg tablet every morning and 10 mg tablet in the afternoon.continue on Potasium 10 meg twice daily.on Nitro 0.4 mg SL  - CMP with eGFR([facility])  4. Chronic pain syndrome Continue current regimen.  5. Bilateral impacted cerumen Bilateral ear cerumen impaction unable to visualize TM.Asymptomatic.OTC debrox 6.5 % otic solution instil 5 drops into each ear twice daily x 4 days then return 04/20/2018 for ear lavage.   6. Dry skin Bilateral lower extremities dry scaly skin.Apply Aquaphor twice daily.  Family/ staff Communication: Reviewed plan of care with patient.  Labs/tests ordered: CBC/diff and CMP today.   Sandrea Hughs, NP

## 2018-04-14 ENCOUNTER — Encounter: Payer: Self-pay | Admitting: *Deleted

## 2018-04-14 ENCOUNTER — Other Ambulatory Visit: Payer: Self-pay | Admitting: Internal Medicine

## 2018-04-14 ENCOUNTER — Other Ambulatory Visit: Payer: Self-pay | Admitting: *Deleted

## 2018-04-14 ENCOUNTER — Other Ambulatory Visit: Payer: Self-pay

## 2018-04-14 LAB — CBC WITH DIFFERENTIAL/PLATELET
BASOS PCT: 0.9 %
Basophils Absolute: 57 cells/uL (ref 0–200)
Eosinophils Absolute: 599 cells/uL — ABNORMAL HIGH (ref 15–500)
Eosinophils Relative: 9.5 %
HCT: 35 % — ABNORMAL LOW (ref 38.5–50.0)
Hemoglobin: 11.7 g/dL — ABNORMAL LOW (ref 13.2–17.1)
Lymphs Abs: 1184 cells/uL (ref 850–3900)
MCH: 30.4 pg (ref 27.0–33.0)
MCHC: 33.4 g/dL (ref 32.0–36.0)
MCV: 90.9 fL (ref 80.0–100.0)
MPV: 8.7 fL (ref 7.5–12.5)
Monocytes Relative: 12.2 %
Neutro Abs: 3692 cells/uL (ref 1500–7800)
Neutrophils Relative %: 58.6 %
PLATELETS: 419 10*3/uL — AB (ref 140–400)
RBC: 3.85 10*6/uL — ABNORMAL LOW (ref 4.20–5.80)
RDW: 12.9 % (ref 11.0–15.0)
TOTAL LYMPHOCYTE: 18.8 %
WBC mixed population: 769 cells/uL (ref 200–950)
WBC: 6.3 10*3/uL (ref 3.8–10.8)

## 2018-04-14 LAB — COMPLETE METABOLIC PANEL WITH GFR
AG Ratio: 1 (calc) (ref 1.0–2.5)
ALKALINE PHOSPHATASE (APISO): 87 U/L (ref 40–115)
ALT: 17 U/L (ref 9–46)
AST: 24 U/L (ref 10–35)
Albumin: 3.6 g/dL (ref 3.6–5.1)
BUN: 14 mg/dL (ref 7–25)
CHLORIDE: 91 mmol/L — AB (ref 98–110)
CO2: 29 mmol/L (ref 20–32)
CREATININE: 0.93 mg/dL (ref 0.70–1.18)
Calcium: 9.2 mg/dL (ref 8.6–10.3)
GFR, Est African American: 95 mL/min/{1.73_m2} (ref >=60)
GFR, Est Non African American: 82 mL/min/{1.73_m2} (ref >=60)
GLUCOSE: 128 mg/dL — AB (ref 65–99)
Globulin: 3.6 g/dL (calc) (ref 1.9–3.7)
Potassium: 4.6 mmol/L (ref 3.5–5.3)
Sodium: 128 mmol/L — ABNORMAL LOW (ref 135–146)
Total Bilirubin: 0.4 mg/dL (ref 0.2–1.2)
Total Protein: 7.2 g/dL (ref 6.1–8.1)

## 2018-04-14 NOTE — Patient Outreach (Signed)
[facility]) Care Management  04/14/2018  [name] [birth year] [id number]   Care coordination  [facility] RN CM received a call from [facility] from [facility] patient experience related to concerns about [name] after a letter received.   CM collaborated with Shelly, [facility] SW, K Humble and  L. Zigmund Daniel [facility] RN CM. Melven Sartorius will inquire about LCSW [facility] referral   Kimberly L. Lavina Hamman, RN, BSN, [place] Coordinator Office number [phone] Mobile number [phone]  Main [facility] number [phone] Fax number [phone]

## 2018-04-14 NOTE — Patient Outreach (Signed)
[facility]) Care Management  04/14/2018  [name] November 26, 1945 [id number]  BSW received an incoming call from Verlin Fester, Hotel manager with the office of patient experience at [facility]. [name] read a letter the patient mailed to the patient experience office after a recent hospital admission. The patients letter indicated concerns with safety in his home. BSW outreached [employer], CSW with [facility] Care Management. [name] will attempt to outreach the patient today to assess the patients needs further. BSW also updated [name] [facility] nurse care manager who has a planned home visit with the patient on Tuesday September 3rd. CSW will coordinate with [facility] RNCM regarding patient needs and future appointments.  Daneen Schick, BSW, CDP [facility] [phone]

## 2018-04-14 NOTE — Patient Outreach (Signed)
[name]) Care Management  04/14/2018  [name] Aug 11, 1946 [id number]   CSW was able to make initial contact with patient today to perform phone assessment, as well as assess and assist with social work needs and services.  CSW introduced self, explained role and types of services provided through [place] Management ([place] Management).  CSW further explained to patient that CSW works with patient's RNCM, also with [place] Management, [name]. CSW then explained the reason for the call, indicating that CSW thought that patient may benefit from social work services and resources to assist with alternate housing arrangements, as patient has verbalized that his home is not safe.  CSW obtained two HIPAA compliant identifiers from patient, which included patient's name and date of birth. Patient reports that he is looking to move, but did not wish to elaborate.  CSW asked if this was a good time for patient to talk, patient agreed.  CSW confirmed that patient currently lives at home alone.  CSW inquired as to whether or not patient feels safe in his current environment.  Patient agreed to discuss his home situation with [name], [facility], also with [place] Management, when she goes out to patient's home for the initial home visit on Tuesday, April 18, 2018 at 11:00AM.  CSW was able to ensure that patient does not feel homicidal or suicidal at this time. CSW inquired as to whether or not patient has transportation assistance to and from his physician appointments.  Patient confirms that he has transportation to all his appointments and that he attends his physician appointments on a regular basis.  Patient reported that he would communicate his transportation needs to Ms. Zigmund Daniel if they arise.  Patient admits to also taking his prescription medication exactly as prescribed.  Patient reported that he recently left the hospital  against medical advice because he did not receive his pain medication on time.  Patient indicated that he was fearful that he would go into withdrawal symptoms. CSW will perform a case closure on patient, as patient has decided to decline social work services through [place] with Scientist, clinical (histocompatibility and immunogenetics), at this time.  CSW will notify patient's RNCM with [place] Management, [name] of CSW's plans to close patient's case.  CSW will fax an update to patient's Primary Care Physician, Dr. [name] to ensure that they are aware of CSW's involvement with patient's plan of care. CSW was able to ensure that patient has the correct contact information for CSW, encouraging patient to contact CSW directly if social work needs arise in the near future. Nat Christen, BSW, MSW, LCSW  Licensed Education officer, [facility]  [place] [address], [place], [place] [address] Physical Address-[address], [place], [place] [address] Toll Free Main # [phone] Fax # [phone] Cell # [phone]  Office # [phone] [email or web address]

## 2018-04-18 ENCOUNTER — Other Ambulatory Visit: Payer: Self-pay | Admitting: *Deleted

## 2018-04-18 ENCOUNTER — Telehealth: Payer: Self-pay

## 2018-04-18 NOTE — Patient Outreach (Signed)
[name]) Care Management  04/18/2018  [name] July 03, 1946 [id number]    Transition of care (week 2)  RN spoke with pt today and verified identifiers and reintroduced [name] services and the purpose for today's call (pt receptive).  Discussed the plan of care and ongoing issues as discussed on the last call with more details concerning pt's ongoing issues.  DM - Pt verified his last A1C was 6.6 and his CBGs are "good". Pt feels he is managing his medical condition with no reported issues and takes his prescribed medications as recommended.  RN offered any assistance in maintaining this medical condition (delcined at this time). PAIN - Pt reports current being managed by the pain clinic with follow up visits every 6 months. Pt states his pain related to his spine the extends up toward his head and his provider are aware of these details. Pt verifies he received Fentanyl patches and Dilauda noted via Epic medications. Pt encouraged to take as prescribed. Other complaint relates to some recent foot pain. Pt states his provided is aware and was suppose to order some medications however he believes his provider may have called it to the wrong pharmacy. Pt plans to contact [name] and alert them of the correct pharmacy and check on this medications. RN will not intervene and allow pt to manage his task as indicated however will follow up on the next call.  WOUND - Pt states he has been cleaning his RLE (cellulitis) and applying the topical antibiotic ointment and dressing as directed. States sites are doing "much better" with no redness, odor or further irritation noted. Pt states he applies his compression stockings everyday and takes them off at night as instructed by his providers. Pt state he has an assisted device to apply this compression stockings with no problems mentioned. Reports some swelling around his ankles but again much better then before.  Pt able to  verified transportation source in order to get to all her appointments. RN review all scheduled appointments noted in Epic. Pt has verbalized and agreed to ongoing transition of care call over the next few weeks with this RN case management with no additional issues. Pt denies any issues with system check with no problems GI/GU and continues to manage his care with no other issues.   RN inquired on the recent letter written however pt indicated he was no longer having those issues however did not like were he was staying but did not wish to discuss other available options at this time. Pt did not wish to consider assisted living facility to better accommodate his care at this time. RN has spoke with the  [name] social worker who has confirmed pt has declined assistance. Pt has been offered counseling and aware to notify his provider if and when he is willing to seek further counseling with possible psychiatrist. Pt currently taking Wellbutrin XL.   RN has generated a plan of care related to pt's recent [name] discharge and pt has agreed to this plan of care and to continue attending all medical appointments, talking all prescribed and recommended medications from all his providers along with attending to his ongoing wound care to prevent readmission to the [name] with prevention care measures.   RN inquired and reminded pt that a A.D packet and MOST will be mailed and RN will follow up next week to inquire further. Will resend this packet if not received over the next week. No other inquires or request  at this time with no other issues to address.   [name]     Most Recent Value  Care Plan Problem One  [name] prevention related to cellulitis  Role Documenting the Problem One  Care Management [name] for Problem One  Active  THN Long Term Goal   Pt will not have a readmission related to his wounds over the next 60 days.  THN Long Term Goal Start Date   04/12/18  Interventions for Problem One Long Term Goal  Will verify pt is continue to dress wound via cellulitis pt changes once daily and uses his topical anbx as prescribed. Will continue to inquired and encouraged pt to apply anbx accordingly and report to provider with any changes. Will verifiy no infections, pain or ordor to the site as pt encouraged to reports if encountered. Will extend and continue ot follow up accordingly with this goal.  THN CM Short Term Goal #1   Medication adherence post op discharge over the next 30 days.  THN CM Short Term Goal #1 Start Date  04/12/18  Interventions for Short Term Goal #1  Pt pending a new medication for his foot pain. Pt will contact his provider to verify the correct pharmacy where this medication was directed to. today. Will encourage adherence and follow up next week. All other medications review and verified on supplies.   THN CM Short Term Goal #2   Medical appointments adherence post-op discharge over the next 30 days.  THN CM Short Term Goal #2 Start Date  04/05/18  Interventions for Short Term Goal #2  Will verify all follow up appointments and review appointments noted via EPIC and request pt's adherence with attendance. Will also verify transportation to avoid any delays on scheduled appointments.  Will extend and follow up accordingly with pt's ongoing adherence.       [name], RN Care Management Coordinator [place] [facility] [phone]

## 2018-04-18 NOTE — Telephone Encounter (Signed)
Message left on clinical intake voicemail:   Patient called to indicate that he seen [place] on 04/13/18 and 2 prescriptions were to be sent to his pharmacy. Patient has checked with the pharmacy since visit and no rx's were sent. Patient would like rx for his legs and ears sent to [facility].  I reviewed last OV note and both medication recommendations were for OTC medications  1.) Debrox 2.) Aquaphor   I called patient and left detailed message informing him of recommendations and why pharmacy does not have rx available for pick-up (OTC medication). Patient  Instructed to call if questions or concerns.

## 2018-04-19 ENCOUNTER — Ambulatory Visit: Payer: Self-pay

## 2018-04-20 ENCOUNTER — Ambulatory Visit: Payer: PRIVATE HEALTH INSURANCE | Admitting: Family

## 2018-04-21 ENCOUNTER — Telehealth (INDEPENDENT_AMBULATORY_CARE_PROVIDER_SITE_OTHER): Payer: Self-pay

## 2018-04-21 NOTE — Telephone Encounter (Signed)
Darlina Guys with [facility] wanted to let Dr. Sharol Given know that they have not been able to reach patient by phone to go to his home for HHPT.  Stated that they have left VM's for patient to return their call.  If patient calls back, they will attempt to go see patient on Monday, 04/24/2018.

## 2018-04-24 NOTE — Telephone Encounter (Signed)
Pt has follow up with Shawn on Wednesday 04/26/18

## 2018-04-25 ENCOUNTER — Other Ambulatory Visit: Payer: PRIVATE HEALTH INSURANCE

## 2018-04-25 ENCOUNTER — Ambulatory Visit: Payer: PRIVATE HEALTH INSURANCE

## 2018-04-26 ENCOUNTER — Ambulatory Visit (INDEPENDENT_AMBULATORY_CARE_PROVIDER_SITE_OTHER): Payer: PRIVATE HEALTH INSURANCE | Admitting: Physician Assistant

## 2018-04-26 ENCOUNTER — Encounter (INDEPENDENT_AMBULATORY_CARE_PROVIDER_SITE_OTHER): Payer: Self-pay | Admitting: Physician Assistant

## 2018-04-26 DIAGNOSIS — E08621 Diabetes mellitus due to underlying condition with foot ulcer: Secondary | ICD-10-CM

## 2018-04-26 DIAGNOSIS — E08622 Diabetes mellitus due to underlying condition with other skin ulcer: Secondary | ICD-10-CM

## 2018-04-26 DIAGNOSIS — L97919 Non-pressure chronic ulcer of unspecified part of right lower leg with unspecified severity: Secondary | ICD-10-CM

## 2018-04-26 DIAGNOSIS — I87311 Chronic venous hypertension (idiopathic) with ulcer of right lower extremity: Secondary | ICD-10-CM

## 2018-04-26 DIAGNOSIS — L97912 Non-pressure chronic ulcer of unspecified part of right lower leg with fat layer exposed: Secondary | ICD-10-CM

## 2018-04-26 DIAGNOSIS — E440 Moderate protein-calorie malnutrition: Secondary | ICD-10-CM

## 2018-04-26 DIAGNOSIS — L97421 Non-pressure chronic ulcer of left heel and midfoot limited to breakdown of skin: Secondary | ICD-10-CM

## 2018-04-26 NOTE — Progress Notes (Signed)
Office Visit Note   Patient: [name]           Date of Birth: Oct 03, 1945           MRN: [id number] Visit Date: 04/26/2018              Requested by: [name], [place], [place] [address] PCP: [name], DO  Chief Complaint  Patient presents with  . Right Leg - Pain, Follow-up      HPI: Patient is a 72 year old male who is seen for postoperative follow-up following I&D of the right lower extremity calf abscesses with local tissue rearrangement and wound closure on 04/07/2018.  He has been applying antibiotic ointment to the area and did purchase some compression stockings reports that he has been wearing these but he still has a lot of edema today.  He reports that he has been trying to elevate his leg as much as possible but again he does have significant edema.  He reports that he is having burning pain in both his feet and off and on feels like barely walk.  We did identify a new ulcer over the left heel and oriented to get some home health nursing to help initiate treatment for this.  We try to get home health physical therapy for the patient as he reported difficulty ambulating in the household but they have apparently been having trouble getting in touch with the patient in order to get therapies initiated.  Organ to discuss this with [facility] again today and encouraged the patient to answer his phone and call [facility] back if he misses a phone call to try to get therapy set up as this appears to be a great concern of his today.  He reports that his chronic pain medication is not controlling the pain in his lower extremities.  He does also take gabapentin but takes 800 mg 1 time a day at bedtime as he feels like the gabapentin interferes with his functional status at other times during the day and makes him confused.  He is on Celebrex twice a day.  He reports that Voltaren gel makes him sick and does not want to use this.   He does ambulate with a  cane.  Assessment & Plan: Visit Diagnoses:  1. 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 Diabetic ulcer of left heel associated with diabetes mellitus due to underlying condition, limited to breakdown of skin ([place])   3. 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 Protein-calorie malnutrition, moderate (H[facility])     Plan: Sutures were harvested from the right anterior calf today.  He should continue to elevate the right lower extremity as much as possible.  Continue compression to the right lower extremity.  To the left heel ulcer we have referred back to [facility] for nursing to get him set up with Arnette Norris silver collagen dressings to the right heel wound and to instruct patient in how to do this.  We also encouraged the patient to answer his phone when home health physical therapy called to set up a visit with him in order for them to work on progressive ambulation and heel cord stretching his his heel cords are very tight and this may be contributing to some of his discomfort in his feet.  He does however have severe peripheral neuropathy related  to his diabetes and this was discussed with the patient.  He is on maximal treatment with gabapentin and does not want to take gabapentin at other times during the daytime as it makes him somnolent and he feels somewhat confused.  He is on Celebrex twice daily.  He is also on chronic pain management with Duragesic patches and Dilaudid orally and I encouraged him to discuss this further with his pain management physician or his primary care physician.  Follow-Up Instructions: Return in about 2 weeks (around 05/10/2018).   Ortho Exam  Patient is alert, oriented, no adenopathy, well-dressed, normal affect, normal respiratory effort. The right lower leg wound is healing and sutures were harvested this visit.  There is no  cellulitis.  There is edema despite the patient having a compression stocking on this leg today.  Patient also has a new ulcer over the left heel which is limited to breakdown of the skin.  It is approximately 2 cm in diameter and 1 mm in depth.  There is no signs of cellulitis.  Imaging: No results found. No images are attached to the encounter.  Labs: Lab Results  Component Value Date   HGBA1C 6.6 (H) 03/21/2018   HGBA1C 6.6 12/01/2017   HGBA1C 6.5 07/28/2017   ESRSEDRATE 30 (H) 12/11/2016   ESRSEDRATE 12 10/17/2009   CRP <0.8 12/11/2016   REPTSTATUS 12/16/2016 FINAL 12/12/2016   GRAMSTAIN NO WBC SEEN NO ORGANISMS SEEN  12/12/2016   CULT  12/12/2016    RARE STAPHYLOCOCCUS AUREUS WITHIN NORMAL SKIN FLORA    LABORGA STAPHYLOCOCCUS AUREUS 12/12/2016     Lab Results  Component Value Date   ALBUMIN 2.6 (L) 04/02/2018   ALBUMIN 3.1 (L) 04/01/2018   ALBUMIN 3.5 12/11/2016    There is no height or weight on file to calculate BMI.  Orders:  No orders of the defined types were placed in this encounter.  No orders of the defined types were placed in this encounter.    Procedures: No procedures performed  Clinical Data: No additional findings.  ROS:  All other systems negative, except as noted in the HPI. Review of Systems  Objective: Vital Signs: There were no vitals taken for this visit.  Specialty Comments:  No specialty comments available.  PMFS History: Patient Active Problem List   Diagnosis Date Noted  . Abscess of right lower leg   . Severe protein-calorie malnutrition ([place])   . 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 Diabetic polyneuropathy associated with type 2 diabetes mellitus ([place])   . Cellulitis 04/01/2018  . Cellulitis of right lower extremity 04/01/2018  . Tachycardia 04/01/2018  . 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 Unstable angina ([place]) 09/20/2015  . Chest pain, rule out acute myocardial infarction  09/18/2015  . Diabetes mellitus type 2 with peripheral artery disease ([place]) 09/18/2015  . Hyponatremia 09/18/2015  . Chest pain on exertion 09/18/2015  . Varicose veins of bilateral lower extremities with other complications 46/50/3546  . Occlusion and stenosis of carotid artery without mention of cerebral infarction 10/18/2011  . SINUSITIS, ACUTE 03/13/2010  . BACK PAIN 11/13/2009  . FATIGUE 10/17/2009  . Diabetic neuropathy associated with diabetes mellitus due to underlying condition ([place]) 08/29/2009  . Vitamin D deficiency 08/29/2009  . Hyposmolality and/or hyponatremia 08/29/2009  . VISUAL ACUITY, DECREASED, LEFT EYE 08/29/2009  . PERIPHERAL VASCULAR DISEASE 08/29/2009  . SINUSITIS, CHRONIC 08/29/2009  . NEPHROLITHIASIS 08/29/2009  . FIBROMYALGIA 08/29/2009  . BICEPS TENDON  RUPTURE, RIGHT 08/11/2009  . DIABETES MELLITUS, TYPE II 08/05/2009  . Hyperlipidemia 08/05/2009  . ANEMIA-NOS 08/05/2009  . ANXIETY 08/05/2009  . DEPRESSION 08/05/2009  . Chronic pain syndrome 08/05/2009  . Essential hypertension 08/05/2009  . GERD 08/05/2009  . BENIGN PROSTATIC HYPERTROPHY 08/05/2009  . [place] DISEASE, LUMBAR 08/05/2009  . Osteoporosis 08/05/2009   Past Medical History:  Diagnosis Date  . Anemia    NOS  . Anxiety   . 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 BPH (benign prostatic hypertrophy)   . Broken foot Oct. 12, 2013   Right foot Fx  . CAD (coronary artery disease)   . Cellulitis and abscess of leg 03/2018   right leg  . Chronic hyponatremia   . Chronic pain syndrome   . COPD (chronic obstructive pulmonary disease) ([place])   . Depression   . Diabetes mellitus   . Diabetic foot ulcer ([place])   . DJD (degenerative joint disease)    L wrist  . DM2 (diabetes mellitus, type 2) ([place])   . GERD (gastroesophageal reflux disease)   . HLD (hyperlipidemia)   . HTN (hypertension)   . Lumbar disc disease   . Osteoporosis    tx per [facility], Dr Prudencio Burly, thought due to heavy  steriod use after 1978  . Spine fracture    hx, multiple  . Varicose veins     Family History  Problem Relation Age of Onset  . Lung cancer Father 71  . Diabetes Mother   . Osteoporosis Mother   . Throat cancer Unknown        family hx - "bad living" - also lung CA, heart disease and renal failure    . Diabetes Sister   . Suicidality Son 58  . Lung cancer Maternal Grandmother   . Diabetes Maternal Grandfather   . Heart attack Paternal Grandmother     Past Surgical History:  Procedure Laterality Date  . CARDIAC CATHETERIZATION N/A 09/19/2015   Procedure: Left Heart Cath and Coronary Angiography;  Surgeon: Adrian Prows, MD;  Location: [facility];  Service: Cardiovascular;  Laterality: N/A;  . CARPAL TUNNEL RELEASE    . CORONARY ARTERY BYPASS GRAFT N/A 09/22/2015   Procedure: CORONARY ARTERY BYPASS GRAFTING (CABG) times four using the right greater saphenous vein harvested endoscopically and the left internal mammary artery.  LIMA-LAD, SEQ SVG-DIAG & OM, SVG-PD.;  Surgeon: [name], MD;  Location: [place];  Service: Open Heart Surgery;  Laterality: N/A;  . EYE SURGERY    . I&D EXTREMITY Right 04/07/2018   Procedure: IRRIGATION AND DEBRIDEMENT ABSCESS RIGHT LEG;  Surgeon: Newt Minion, MD;  Location: [place] [facility];  Service: Orthopedics;  Laterality: Right;  . L foot open repair jones fracture  2010    5th metetarsal   . left ankle ganglion cyst  1976  . left cataract  1996   right - 1997  . left CTS  2006   R CTS - 2007  . left foot surgery  1998   R surgery/fracture - 1999  . left wrist/hand fusion  2008  . [place], 2004  . NASAL SINUS SURGERY     multiple- x8. last 1997 with obliteration  . REPAIR THORACIC AORTA  2017  . right hand fracture  1969  . ROTATOR CUFF REPAIR  2006   R, than repeat 2011, Dr Theda Sers  . TRIGGER FINGER RELEASE Left 11/11/2014   Procedure: LEFT LONG  FINGER RELEASE TRIGGER FINGER/A-1 PULLEY;  Surgeon: Milly Jakob, MD;  Location:  [place];  Service: Orthopedics;  Laterality: Left;  . vocal surgery  1996   Social History   Occupational History  . Not on file  Tobacco Use  . Smoking status: Former Smoker    Packs/day: 1.50    Years: 30.00    Pack years: 45.00    Types: Cigarettes    Last attempt to quit: 11/06/1988    Years since quitting: 29.4  . Smokeless tobacco: Never Used  Substance and Sexual Activity  . Alcohol use: Not Currently    Comment: None in 30 years but drank beer before  . Drug use: No  . Sexual activity: Not Currently

## 2018-04-27 ENCOUNTER — Encounter: Payer: Self-pay | Admitting: Family

## 2018-04-27 ENCOUNTER — Ambulatory Visit (INDEPENDENT_AMBULATORY_CARE_PROVIDER_SITE_OTHER): Payer: PRIVATE HEALTH INSURANCE | Admitting: Family

## 2018-04-27 VITALS — BP 128/72 | HR 62 | Temp 98.1°F | Ht 72.0 in | Wt 209.0 lb

## 2018-04-27 DIAGNOSIS — E08621 Diabetes mellitus due to underlying condition with foot ulcer: Secondary | ICD-10-CM

## 2018-04-27 DIAGNOSIS — H6123 Impacted cerumen, bilateral: Secondary | ICD-10-CM

## 2018-04-27 DIAGNOSIS — L97421 Non-pressure chronic ulcer of left heel and midfoot limited to breakdown of skin: Secondary | ICD-10-CM

## 2018-04-27 DIAGNOSIS — L97411 Non-pressure chronic ulcer of right heel and midfoot limited to breakdown of skin: Secondary | ICD-10-CM

## 2018-04-27 NOTE — Patient Instructions (Signed)
1. Referral to wound care Center for both heels wound care placed today.Please follow up as directed.

## 2018-04-27 NOTE — Progress Notes (Addendum)
Provider: Katiria Calame FNP-C  [name], DO  Patient Care Team: [name], DO as PCP - General (Internal Medicine) Serafina Mitchell, MD as Consulting Physician (Vascular Surgery) Adrian Prows, MD as Consulting Physician (Cardiology) Jana Half, DPM as Consulting Physician (Podiatry) Newt Minion, MD as Consulting Physician (Orthopedic Surgery) Kriste Basque, MD as Referring Physician (Pain Medicine) Tobi Bastos, RN as [place] Management  Extended Emergency Contact Information Primary Emergency Contact: Laser,Jason Address: [address]          [place], [place] [address] Johnnette Litter of [place] Phone: [phone] Relation: Son Secondary Emergency Contact: Silvestre Gunner [place] of [place] Phone: [phone] Mobile Phone: [phone] Relation: Friend   Goals of care: Advanced Directive information Advanced Directives 04/27/2018  Does Patient Have a Medical Advance Directive? No  Type of Advance Directive -  Does patient want to make changes to medical advance directive? -  Copy of [place] in Chart? -  Would patient like information on creating a medical advance directive? -     Chief Complaint  Patient presents with  . Follow-up    Pt is being seen for an ear lavage. Pt never used debrox drops.     HPI:  Pt is a 72 y.o. male seen today at [facility] for an acute visit for ear bilateral ear lavage.he was here 04/13/2018 and Debrox ear drops ordered but states did not pick it up.He denies any pain or ringing in the ears but still having problems with hearing. He was seen by ortho 04/26/2018 right leg stitches removed.He is still on oral doxycycline twice daily for right leg cellulitis.He denies any fever or chills. He states his blood sugars have been running in the 100's no log today.He complains of bilateral heel wound that has been draining on his socks unclear how long he has had  the ulcers.He states ulcers not healing.He has a significant medical history of Type 2 DM with peripheral Neuropathy,PVD,varicose veins among other conditions.  Past Medical History:  Diagnosis Date  . Anemia    NOS  . Anxiety   . 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 BPH (benign prostatic hypertrophy)   . Broken foot Oct. 12, 2013   Right foot Fx  . CAD (coronary artery disease)   . Cellulitis and abscess of leg 03/2018   right leg  . Chronic hyponatremia   . Chronic pain syndrome   . COPD (chronic obstructive pulmonary disease) ([place])   . Depression   . Diabetes mellitus   . Diabetic foot ulcer ([place])   . DJD (degenerative joint disease)    L wrist  . DM2 (diabetes mellitus, type 2) ([place])   . GERD (gastroesophageal reflux disease)   . HLD (hyperlipidemia)   . HTN (hypertension)   . Lumbar disc disease   . Osteoporosis    tx per [facility], Dr Prudencio Burly, thought due to heavy steriod use after 1978  . Spine fracture    hx, multiple  . Varicose veins    Past Surgical History:  Procedure Laterality Date  . CARDIAC CATHETERIZATION N/A 09/19/2015   Procedure: Left Heart Cath and Coronary Angiography;  Surgeon: Adrian Prows, MD;  Location: [facility];  Service: Cardiovascular;  Laterality: N/A;  . CARPAL TUNNEL RELEASE    . CORONARY ARTERY BYPASS GRAFT N/A 09/22/2015   Procedure: CORONARY ARTERY BYPASS GRAFTING (CABG) times four using  the right greater saphenous vein harvested endoscopically and the left internal mammary artery.  LIMA-LAD, SEQ SVG-DIAG & OM, SVG-PD.;  Surgeon: [name], MD;  Location: [facility];  Service: Open Heart Surgery;  Laterality: N/A;  . EYE SURGERY    . I&D EXTREMITY Right 04/07/2018   Procedure: IRRIGATION AND DEBRIDEMENT ABSCESS RIGHT LEG;  Surgeon: Newt Minion, MD;  Location: [facility];  Service: Orthopedics;  Laterality: Right;  . L foot open repair jones fracture  2010    5th metetarsal   . left ankle ganglion cyst  1976  . left  cataract  1996   right - 1997  . left CTS  2006   R CTS - 2007  . left foot surgery  1998   R surgery/fracture - 1999  . left wrist/hand fusion  2008  . [place], 2004  . NASAL SINUS SURGERY     multiple- x8. last 1997 with obliteration  . REPAIR THORACIC AORTA  2017  . right hand fracture  1969  . ROTATOR CUFF REPAIR  2006   R, than repeat 2011, Dr Theda Sers  . TRIGGER FINGER RELEASE Left 11/11/2014   Procedure: LEFT LONG FINGER RELEASE TRIGGER FINGER/A-1 PULLEY;  Surgeon: Milly Jakob, MD;  Location: [facility];  Service: Orthopedics;  Laterality: Left;  . vocal surgery  1996    Allergies  Allergen Reactions  . Ivp Dye [Iodinated Diagnostic Agents]     Blood Pressure dropped----pt was pre-medicated with 13 hour prep and did fine with pre-meds--amy 03/09/11   . Penicillins Nausea Only    REACTION: gi upset Has patient had a PCN reaction causing immediate rash, facial/tongue/throat swelling, SOB or lightheadedness with hypotension: No Has patient had a PCN reaction causing severe rash involving mucus membranes or skin necrosis: No Has patient had a PCN reaction that required hospitalization: No Has patient had a PCN reaction occurring within the last 10 years: No If all of the above answers are "NO", then may proceed with Cephalosporin use.   . Sulfa Antibiotics Hives  . Sulfonamide Derivatives Hives and Itching  . Lisinopril Cough  . Statins Other (See Comments)    myalgias  . Propoxyphene N-Acetaminophen Other (See Comments)    Sharp pains- headache    Outpatient Encounter Medications as of 04/27/2018  Medication Sig  . ACCU-CHEK FASTCLIX LANCETS MISC Use as directed to check blood glucose. Dx: E11.51  . Blood Glucose Monitoring Suppl (ACCU-CHEK AVIVA PLUS) w/Device KIT Use as directed to check blood glucose daily. Dx: E11.51  . buPROPion (WELLBUTRIN XL) 300 MG 24 hr tablet Take 300 mg by mouth every morning.   . celecoxib (CELEBREX) 200 MG  capsule TAKE 1 CAPSULE(200 MG) BY MOUTH TWICE DAILY  . Cholecalciferol (VITAMIN D) 1000 UNITS capsule Take 5,000 Units by mouth daily.   . clonazePAM (KLONOPIN) 0.5 MG tablet Take 1 mg by mouth daily at 12 noon.   . clopidogrel (PLAVIX) 75 MG tablet Take 75 mg by mouth daily.  . cyclobenzaprine (FLEXERIL) 10 MG tablet Take 10 mg by mouth 3 (three) times daily as needed for muscle spasms.   Marland Kitchen desonide (DESOWEN) 0.05 % cream Apply 1 application topically daily as needed ([place]).  . doxycycline (VIBRAMYCIN) 100 MG capsule Take 1 capsule (100 mg total) by mouth 2 (two) times daily.  . fentaNYL (DURAGESIC - DOSED MCG/HR) 50 MCG/HR Place 50 mcg onto the skin every 3 (three) days.  . furosemide (LASIX) 20 MG tablet Take  20 mg by mouth as directed. 20 mg every AM and 4m every PM  . gabapentin (NEURONTIN) 400 MG capsule Take 800 mg by mouth at bedtime.   .Marland Kitchenglucose blood (ACCU-CHEK AVIVA PLUS) test strip 1 each by Other route as needed for other. Use as instructed. Dx: E11.51  . HYDROmorphone (DILAUDID) 2 MG tablet Take 2 mg by mouth every 6 (six) hours as needed for severe pain.   .Marland KitchenJANUVIA 100 MG tablet TAKE 1 TABLET BY MOUTH EVERY DAY  . losartan (COZAAR) 25 MG tablet Take 25 mg by mouth daily.  . mineral oil-hydrophilic petrolatum (AQUAPHOR) ointment Apply topically 2 (two) times daily. Apply to lower extremities twice daily for dry skin until resolved.  . Multiple Vitamins-Iron (MULTIVITAMINS WITH IRON) TABS tablet Take 1 tablet by mouth daily.  . nitroGLYCERIN (NITROSTAT) 0.4 MG SL tablet Place 0.4 mg under the tongue every 5 (five) minutes as needed for chest pain.  .Marland Kitchenomeprazole (PRILOSEC) 40 MG capsule Take 1 capsule (40 mg total) by mouth daily.  . potassium chloride (K-DUR) 10 MEQ tablet Take 10 mEq by mouth 2 (two) times daily.  .Marland KitchenPROAIR HFA 108 (90 BASE) MCG/ACT inhaler Inhale 2 puffs into the lungs every 6 (six) hours as needed.   .Marland KitchenrOPINIRole (REQUIP) 1 MG tablet Take 2 mg by mouth 3  times/day as needed-between meals & bedtime (restless legs).   . tamsulosin (FLOMAX) 0.4 MG CAPS capsule Take 1 capsule (0.4 mg total) by mouth 2 (two) times daily.  .Marland KitchenVITAMIN E PO Take 8,000 Units by mouth daily.  .Marland Kitchenzonisamide (ZONEGRAN) 50 MG capsule TAKE 3 CAPSULES(150 MG) BY MOUTH AT BEDTIME   No facility-administered encounter medications on file as of 04/27/2018.     Review of Systems  Constitutional: Negative for appetite change, chills, fatigue and fever.  Respiratory: Negative for cough, chest tightness, shortness of breath and wheezing.   Cardiovascular: Negative for chest pain and palpitations.       Right leg swelling   Gastrointestinal: Negative for abdominal distention, abdominal pain, constipation, diarrhea, nausea and vomiting.  Musculoskeletal: Positive for arthralgias and gait problem.  Skin: Negative for color change, pallor and rash.       Right upper lateral shin area ulcer.bilateral heel ulcers draining.  Neurological: Negative for dizziness, light-headedness and headaches.       Hx peripheral neuropathy  Psychiatric/Behavioral: Negative for agitation, confusion and sleep disturbance. The patient is not nervous/anxious.     Immunization History  Administered Date(s) Administered  . Influenza Whole 06/16/2009  . Influenza, High Dose Seasonal PF 05/20/2017  . Pneumococcal Conjugate-13 09/14/2013  . Pneumococcal Polysaccharide-23 08/17/2007, 08/16/2014  . Td 10/17/2009   Pertinent  Health Maintenance Due  Topic Date Due  . OPHTHALMOLOGY EXAM  04/08/1956  . COLONOSCOPY  04/08/1996  . INFLUENZA VACCINE  03/16/2018  . HEMOGLOBIN A1C  09/21/2018  . FOOT EXAM  03/29/2019  . PNA vac Low Risk Adult  Completed   Fall Risk  04/27/2018 04/14/2018 04/12/2018 03/21/2018 01/17/2018  Falls in the past year? _0   Number falls in past yr: 2 or more 2 or more 2 or more 2 or more 1  Injury with Fall? Yes Yes Yes No -  Risk Factor Category  - High Fall Risk - - -    Risk for fall due to : - History of fall(s);Impaired balance/gait;Impaired mobility;Medication side effect Impaired balance/gait;History of fall(s) - -  Follow up - Education provided;Falls prevention discussed  Falls prevention discussed - -    Vitals:   04/27/18 1129  BP: 128/72  Pulse: 62  Temp: 98.1 F (36.7 C)  TempSrc: Oral  SpO2: 96%  Weight: 209 lb (94.8 kg)  Height: 6' (1.829 m)   Body mass index is 28.35 kg/m. Physical Exam  Constitutional: He is oriented to person, place, and time. He appears well-developed and well-nourished. No distress.  HENT:  Head: Normocephalic.  Nose: Nose normal.  Mouth/Throat: No oropharyngeal exudate.  Bilateral ear cerumen lavaged moderate amounts of cerumen obtained with curete.Patient tolerated procedure well.TM clear without any redness.  Eyes: Pupils are equal, round, and reactive to light. Right eye exhibits no discharge. Left eye exhibits no discharge. No scleral icterus.  Neck: Normal range of motion. No JVD present. No thyromegaly present.  Cardiovascular: Normal rate, regular rhythm, normal heart sounds and intact distal pulses. Exam reveals no gallop and no friction rub.  No murmur heard. Pulmonary/Chest: Effort normal and breath sounds normal. No respiratory distress. He has no wheezes. He has no rales.  Abdominal: Soft. Bowel sounds are normal. He exhibits no distension and no mass. There is no tenderness. There is no rebound and no guarding.  Musculoskeletal: He exhibits no tenderness.  Unsteady gait ambulates with right hand cane Right leg edema knee high hose in place.   Lymphadenopathy:    He has no cervical adenopathy.  Neurological: He is oriented to person, place, and time. He has normal strength. Gait abnormal.  Decreased sensation to both feet   Skin: Skin is warm and dry. No rash noted. No erythema. No pallor.  1. Right upper lateral shin area ulcer wound bed yellow without any drainage.surrounding skin tissue without  any redness or tenderness.   2. Left heel stage 2 ulcer 1 cm X 1.5 cm wound bed with yellow.surrounding skin without any redness.No drainage noted on the wound bed but small amounts of dry drainage noted on socks.  3. Right heel stage 2 measures 1 cm X 1 cm wound bed red without any drainage and no signs of infection on surrounding skin tissue. Bilateral feet skin dry with peeling skin though has improved per patient.   Psychiatric: He has a normal mood and affect. His speech is normal and behavior is normal. Judgment and thought content normal.  Vitals reviewed.   Labs reviewed: Recent Labs    04/05/18 0321 04/06/18 0309 04/13/18 1356  NA 129* 131* 128*  K 4.4 4.3 4.6  CL 93* 95* 91*  CO2 _0 GLUCOSE 126* 113* 128*  BUN _1 CREATININE 0.76 0.69 0.93  CALCIUM 8.1* 8.3* 9.2   Recent Labs    12/01/17  04/01/18 1359 04/02/18 0252 04/13/18 1356  AST 23   < > _2 ALT 15   < > _3 ALKPHOS 70  --  95 81  --   BILITOT  --    < > 0.7 0.8 0.4  PROT  --    < > 7.2 6.2* 7.2  ALBUMIN  --   --  3.1* 2.6*  --    < > = values in this interval not displayed.   Recent Labs    04/01/18 1359  04/05/18 0321 04/06/18 0309 04/13/18 1356  WBC 7.1   < > 7.7 7.2 6.3  NEUTROABS 4.8  --   --   --  3,692  HGB 12.1*   < > 11.4* 10.7* 11.7*  HCT 37.5*   < >  34.5* 33.0* 35.0*  MCV 95.7   < > 93.2 94.3 90.9  PLT 332   < > 351 342 419*   < > = values in this interval not displayed.   Lab Results  Component Value Date   TSH 2.388 09/25/2015   Lab Results  Component Value Date   HGBA1C 6.6 (H) 03/21/2018   Lab Results  Component Value Date   CHOL 139 03/21/2018   HDL 37 (L) 03/21/2018   LDLCALC 86 03/21/2018   LDLDIRECT 108.2 03/13/2010   TRIG 71 03/21/2018   CHOLHDL 3.8 03/21/2018    Significant Diagnostic Results in last 30 days:  Dg Chest 2 View  Result Date: 04/03/2018 CLINICAL DATA:  Hypoxia EXAM: CHEST - 2 VIEW COMPARISON:  01/17/2018 FINDINGS: There  is a calcified right apical pulmonary nodule likely reflecting sequela prior granulomatous disease. There is mild bilateral interstitial prominence. There is no focal consolidation. There is no pleural effusion or pneumothorax. The heart and mediastinal contours are unremarkable. There is evidence of prior CABG. No acute osseous abnormality. 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 IMPRESSION: No active cardiopulmonary disease. Electronically Signed   By: Kathreen Devoid   On: 04/03/2018 08:21   Ct Tibia Fibula Right Wo Contrast  Result Date: 04/04/2018 CLINICAL DATA:  Cellulitis with weeping wounds. Diabetes. Rule out abscess. EXAM: CT OF THE LOWER RIGHT EXTREMITY WITHOUT CONTRAST TECHNIQUE: Multidetector CT imaging of the right lower extremity was performed according to the standard protocol. COMPARISON:  None. FINDINGS: Bones/Joint/Cartilage The lower pole of the included patella is sharply demarcated along its medial aspect and the possibility of an age-indeterminate fracture or injury to the patella is not excluded, series 3/1. No displaced fracture fragment is identified in this may simply reflect normal anatomic variance. If the patient has pain referable to the lower pole the patella, consider knee radiographs with patellar views. The included distal femur, femoral condyles, tibial plateau, tibia and fibula are intact without fracture or suspicious osseous lesions. Osteoarthritis of the ankle joint is small degenerative ossific densities project within posteromedial ankle joint. No joint effusion. The subtalar and midfoot articulations appear congruent. Partially included fixation screw at the base of the fifth metatarsal is seen without complicating features. Ligaments Suboptimally assessed by CT. Muscles and Tendons No intramuscular hemorrhage or mass. Mild diffuse fatty atrophy of the leg musculature. The tendons crossing the ankle joint are largely intact. Achilles tendon is normal  morphology. The peritoneal, posteromedial and extensor tendons are unremarkable without evidence of tenosynovitis. Soft tissues No drainable fluid collection. Moderate diffuse soft tissue edema is noted along the entirety of the included knee, leg and ankle extending along including the dorsum of the included midfoot. Slightly more focal soft tissue swelling is identified anterior to the midshaft of tibia with subtle hypodense appearance of the subcutaneous soft tissues likely represent flocculent fluid. This measures approximately 3 x 2 x 1 cm. IMPRESSION: Diffuse cellulitis of the included right leg without drainable fluid collection. Subtle area of more focal soft tissue prominence and probable flocculent fluid is noted anterior to the midshaft of the tibia measuring 3 x 2 x 1 cm. No acute osseous abnormality. Electronically Signed   By: Ashley Royalty M.D.   On: 04/04/2018 15:44    Assessment/Plan 1. Bilateral hearing loss due to cerumen impaction Bilateral ear lavaged with warm water and moderate amounts of cerumen removed with curete.Tolerated procedure well.TM clear.No signs of infections.  2. 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    3. Diabetic ulcer of right heel associated with diabetes mellitus due to underlying condition, limited to breakdown of skin (HCC) Afebrile.stage 2 measures 1 cm X 1 cm wound bed red without any drainage and no signs of infection on surrounding skin tissue.Urgent referral to  wound care center for wound management.continue current wound care. Right upper lateral shin area ulcer wound bed yellow without any drainage.surrounding skin tissue without any redness or tenderness.  Family/ staff Communication: Reviewed plan of care with patient   Labs/tests ordered: None   Peniel Biel C Dynesha Woolen, NP

## 2018-04-28 ENCOUNTER — Other Ambulatory Visit: Payer: Self-pay | Admitting: *Deleted

## 2018-04-28 ENCOUNTER — Emergency Department (HOSPITAL_COMMUNITY): Payer: Medicare Other

## 2018-04-28 ENCOUNTER — Telehealth (INDEPENDENT_AMBULATORY_CARE_PROVIDER_SITE_OTHER): Payer: Self-pay

## 2018-04-28 ENCOUNTER — Emergency Department (HOSPITAL_COMMUNITY)
Admission: EM | Admit: 2018-04-28 | Discharge: 2018-04-28 | Disposition: A | Discharge status: Home / Self Care | Payer: Medicare Other | Attending: Emergency Medicine | Admitting: Emergency Medicine

## 2018-04-28 ENCOUNTER — Encounter (HOSPITAL_COMMUNITY): Payer: Self-pay

## 2018-04-28 DIAGNOSIS — S129XXA Fracture of neck, unspecified, initial encounter: Secondary | ICD-10-CM

## 2018-04-28 DIAGNOSIS — L02415 Cutaneous abscess of right lower limb: Secondary | ICD-10-CM | POA: Diagnosis not present

## 2018-04-28 DIAGNOSIS — Z7902 Long term (current) use of antithrombotics/antiplatelets: Secondary | ICD-10-CM | POA: Diagnosis not present

## 2018-04-28 DIAGNOSIS — Z79899 Other long term (current) drug therapy: Secondary | ICD-10-CM | POA: Diagnosis not present

## 2018-04-28 DIAGNOSIS — Z7984 Long term (current) use of oral hypoglycemic drugs: Secondary | ICD-10-CM | POA: Diagnosis not present

## 2018-04-28 DIAGNOSIS — E1142 Type 2 diabetes mellitus with diabetic polyneuropathy: Secondary | ICD-10-CM | POA: Diagnosis not present

## 2018-04-28 DIAGNOSIS — Z7901 Long term (current) use of anticoagulants: Secondary | ICD-10-CM | POA: Diagnosis not present

## 2018-04-28 DIAGNOSIS — L97411 Non-pressure chronic ulcer of right heel and midfoot limited to breakdown of skin: Secondary | ICD-10-CM | POA: Diagnosis not present

## 2018-04-28 DIAGNOSIS — Y9384 Activity, sleeping: Secondary | ICD-10-CM | POA: Insufficient documentation

## 2018-04-28 DIAGNOSIS — H5462 Unqualified visual loss, left eye, normal vision right eye: Secondary | ICD-10-CM | POA: Diagnosis not present

## 2018-04-28 DIAGNOSIS — Z9181 History of falling: Secondary | ICD-10-CM | POA: Diagnosis not present

## 2018-04-28 DIAGNOSIS — I10 Essential (primary) hypertension: Secondary | ICD-10-CM | POA: Diagnosis not present

## 2018-04-28 DIAGNOSIS — L03115 Cellulitis of right lower limb: Secondary | ICD-10-CM | POA: Diagnosis not present

## 2018-04-28 DIAGNOSIS — W07XXXA Fall from chair, initial encounter: Secondary | ICD-10-CM | POA: Diagnosis not present

## 2018-04-28 DIAGNOSIS — J449 Chronic obstructive pulmonary disease, unspecified: Secondary | ICD-10-CM | POA: Insufficient documentation

## 2018-04-28 DIAGNOSIS — Z951 Presence of aortocoronary bypass graft: Secondary | ICD-10-CM | POA: Diagnosis not present

## 2018-04-28 DIAGNOSIS — Y92009 Unspecified place in unspecified non-institutional (private) residence as the place of occurrence of the external cause: Secondary | ICD-10-CM | POA: Diagnosis not present

## 2018-04-28 DIAGNOSIS — I2510 Atherosclerotic heart disease of native coronary artery without angina pectoris: Secondary | ICD-10-CM | POA: Insufficient documentation

## 2018-04-28 DIAGNOSIS — Y999 Unspecified external cause status: Secondary | ICD-10-CM | POA: Insufficient documentation

## 2018-04-28 DIAGNOSIS — E43 Unspecified severe protein-calorie malnutrition: Secondary | ICD-10-CM | POA: Diagnosis not present

## 2018-04-28 DIAGNOSIS — S0990XA Unspecified injury of head, initial encounter: Secondary | ICD-10-CM | POA: Diagnosis present

## 2018-04-28 DIAGNOSIS — M5136 Other intervertebral disc degeneration, lumbar region: Secondary | ICD-10-CM | POA: Diagnosis not present

## 2018-04-28 DIAGNOSIS — I6529 Occlusion and stenosis of unspecified carotid artery: Secondary | ICD-10-CM | POA: Diagnosis not present

## 2018-04-28 DIAGNOSIS — Z87891 Personal history of nicotine dependence: Secondary | ICD-10-CM | POA: Diagnosis not present

## 2018-04-28 DIAGNOSIS — M797 Fibromyalgia: Secondary | ICD-10-CM | POA: Diagnosis not present

## 2018-04-28 DIAGNOSIS — W19XXXA Unspecified fall, initial encounter: Secondary | ICD-10-CM

## 2018-04-28 DIAGNOSIS — E11621 Type 2 diabetes mellitus with foot ulcer: Secondary | ICD-10-CM | POA: Diagnosis not present

## 2018-04-28 DIAGNOSIS — N400 Enlarged prostate without lower urinary tract symptoms: Secondary | ICD-10-CM | POA: Diagnosis not present

## 2018-04-28 DIAGNOSIS — S0083XA Contusion of other part of head, initial encounter: Secondary | ICD-10-CM | POA: Diagnosis not present

## 2018-04-28 DIAGNOSIS — F329 Major depressive disorder, single episode, unspecified: Secondary | ICD-10-CM | POA: Diagnosis not present

## 2018-04-28 DIAGNOSIS — I87309 Chronic venous hypertension (idiopathic) without complications of unspecified lower extremity: Secondary | ICD-10-CM | POA: Diagnosis not present

## 2018-04-28 DIAGNOSIS — L97428 Non-pressure chronic ulcer of left heel and midfoot with other specified severity: Secondary | ICD-10-CM | POA: Diagnosis not present

## 2018-04-28 DIAGNOSIS — I83893 Varicose veins of bilateral lower extremities with other complications: Secondary | ICD-10-CM | POA: Diagnosis not present

## 2018-04-28 DIAGNOSIS — E1151 Type 2 diabetes mellitus with diabetic peripheral angiopathy without gangrene: Secondary | ICD-10-CM | POA: Diagnosis not present

## 2018-04-28 DIAGNOSIS — L97812 Non-pressure chronic ulcer of other part of right lower leg with fat layer exposed: Secondary | ICD-10-CM | POA: Diagnosis not present

## 2018-04-28 LAB — BASIC METABOLIC PANEL
Anion gap: 11 (ref 5–15)
BUN: 22 mg/dL (ref 8–23)
CHLORIDE: 97 mmol/L — AB (ref 98–111)
CO2: 25 mmol/L (ref 22–32)
CREATININE: 0.97 mg/dL (ref 0.61–1.24)
Calcium: 9 mg/dL (ref 8.9–10.3)
GFR calc Af Amer: >60 mL/min (ref >60)
GFR calc non Af Amer: >60 mL/min (ref >60)
GLUCOSE: 141 mg/dL — AB (ref 70–99)
POTASSIUM: 3.6 mmol/L (ref 3.5–5.1)
Sodium: 133 mmol/L — ABNORMAL LOW (ref 135–145)

## 2018-04-28 LAB — URINALYSIS, ROUTINE W REFLEX MICROSCOPIC
Bilirubin Urine: NEGATIVE
GLUCOSE, UA: NEGATIVE mg/dL
Hgb urine dipstick: NEGATIVE
Ketones, ur: NEGATIVE mg/dL
LEUKOCYTES UA: NEGATIVE
Nitrite: NEGATIVE
PROTEIN: NEGATIVE mg/dL
SPECIFIC GRAVITY, URINE: 1.012 (ref 1.005–1.030)
pH: 6 (ref 5.0–8.0)

## 2018-04-28 LAB — I-STAT CG4 LACTIC ACID, ED
Lactic Acid, Venous: 1.51 mmol/L (ref 0.5–1.9)
Lactic Acid, Venous: 1.08 mmol/L (ref 0.5–1.9)

## 2018-04-28 LAB — CBC WITH DIFFERENTIAL/PLATELET
Abs Immature Granulocytes: 0 10*3/uL (ref 0.0–0.1)
Basophils Absolute: 0 10*3/uL (ref 0.0–0.1)
Basophils Relative: 1 %
EOS ABS: 0.3 10*3/uL (ref 0.0–0.7)
EOS PCT: 8 %
HEMATOCRIT: 36.6 % — AB (ref 39.0–52.0)
HEMOGLOBIN: 11.8 g/dL — AB (ref 13.0–17.0)
Immature Granulocytes: 1 %
LYMPHS ABS: 0.5 10*3/uL — AB (ref 0.7–4.0)
LYMPHS PCT: 12 %
MCH: 30.7 pg (ref 26.0–34.0)
MCHC: 32.2 g/dL (ref 30.0–36.0)
MCV: 95.3 fL (ref 78.0–100.0)
MONO ABS: 0.8 10*3/uL (ref 0.1–1.0)
MONOS PCT: 19 %
Neutro Abs: 2.6 10*3/uL (ref 1.7–7.7)
Neutrophils Relative %: 59 %
Platelets: 183 10*3/uL (ref 150–400)
RBC: 3.84 MIL/uL — AB (ref 4.22–5.81)
RDW: 13.8 % (ref 11.5–15.5)
WBC: 4.4 10*3/uL (ref 4.0–10.5)

## 2018-04-28 LAB — I-STAT TROPONIN, ED: Troponin i, poc: 0.01 ng/mL (ref 0.00–0.08)

## 2018-04-28 MED ORDER — SODIUM CHLORIDE 0.9 % IV BOLUS
1000.0000 mL | Freq: Once | INTRAVENOUS | Status: AC
  Administered 2018-04-28: 1000 mL via INTRAVENOUS

## 2018-04-28 NOTE — Patient Outreach (Signed)
[facility]) Care Management  04/28/2018  Aspen Deterding 1946/05/31 [id number]    Transition of care  RN spoke with pt today concerning his ongoing management of care. Pt states the cellulitis to his legs are healing however now with wounds to the soles of each foot. Pt states his provider is aware and will be ordering wound care at the clinic. Pt currently awaiting a call back to begin scheduled consults for wound care. Pt states he remains on antibiotics with a dressing that has been applied but it is a loose dressing. RN strongly encouraged pt to keep the site covered with a clean dressing until he is able to visit the wound care clinic. RN also encouraged pt to contact his provider with any odors, fevers or signs of infection from the sites. Pt verbalized an understanding and will continue to follow his instructions awaiting this call back. No other needs or inquires at this time.   Plan of care discussed and updated accordingly based upon pt's progress. Interventions adjusted accordingly and will be re-evaluated on transition of care contacts.  [name]     Most Recent Value  Care Plan Problem One  Hospital prevention related to cellulitis  Role Documenting the Problem One  Care Management [place] for Problem One  Active  [facility] Long Term Goal   Pt will not have a readmission related to his wounds over the next 60 days.  [facility] Long Term Goal Start Date  04/12/18  Interventions for Problem One Long Term Goal  Will extend to allow adherence with new orders pending for wound care. Confirmed the wound care clinic will call to make such arranged for care to the heals of his feet. pt confirms ongoign anbx for wounds. Will continue to re-evaluate on ongoing transition of care.   [facility] CM Short Term Goal #1   Medication adherence post op discharge over the next 30 days.  [facility] CM Short Term Goal #1 Start Date  04/12/18  [facility] CM Short Term Goal #1 Met Date   04/28/18  [facility] CM Short Term Goal #2   Medical appointments adherence post-op discharge over the next 30 days.  [facility] CM Short Term Goal #2 Start Date  04/05/18  Interventions for Short Term Goal #2  Will strongly encouraged ongoing adherence awaiting call to schedule wound care appt. WIll extend to allow adherence with these appointments for his wounds to his heals.      [name], RN Care Management Coordinator [place] [facility] [phone]

## 2018-04-28 NOTE — ED Notes (Signed)
Reported low bp to Dr Melina Copa. We are going to watch and see how bp does at this time.

## 2018-04-28 NOTE — ED Provider Notes (Signed)
[facility] EMERGENCY DEPARTMENT Provider Note   CSN: [id number] Arrival date & time: 04/28/18  1817     History   Chief Complaint Chief Complaint  Patient presents with  . Fall  . Fatigue    HPI [name] is a 72 y.o. male.  He is here after a fall at home.  He states he was sitting in a chair and he fell asleep and he fell out of it striking his head.  He is complaining of some left side of his head pain and some posterior neck pain.  He is on Plavix and is also on as needed narcotics and and on a narcotic patch for chronic facial pain for as long as he can remember.  He denies any numbness or weakness.  No chest pain no shortness of breath no nausea no vomiting.  The history is provided by the patient and the EMS personnel.  Fall  This is a new problem. The current episode started 1 to 2 hours ago. The problem has been gradually improving. Associated symptoms include headaches. Pertinent negatives include no chest pain, no abdominal pain and no shortness of breath. Nothing aggravates the symptoms. Nothing relieves the symptoms. He has tried nothing for the symptoms. The treatment provided no relief.    Past Medical History:  Diagnosis Date  . Anemia    NOS  . Anxiety   . 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 BPH (benign prostatic hypertrophy)   . Broken foot Oct. 12, 2013   Right foot Fx  . CAD (coronary artery disease)   . Cellulitis and abscess of leg 03/2018   right leg  . Chronic hyponatremia   . Chronic pain syndrome   . COPD (chronic obstructive pulmonary disease) ([place])   . Depression   . Diabetes mellitus   . Diabetic foot ulcer ([place])   . DJD (degenerative joint disease)    L wrist  . DM2 (diabetes mellitus, type 2) ([place])   . GERD (gastroesophageal reflux disease)   . HLD (hyperlipidemia)   . HTN (hypertension)   . Lumbar disc disease   . Osteoporosis    tx per [facility], Dr Prudencio Burly, thought due to heavy steriod use after  1978  . Spine fracture    hx, multiple  . Varicose veins     Patient Active Problem List   Diagnosis Date Noted  . Abscess of right lower leg   . Severe protein-calorie malnutrition ([place])   . 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 Diabetic polyneuropathy associated with type 2 diabetes mellitus ([place])   . Cellulitis 04/01/2018  . Cellulitis of right lower extremity 04/01/2018  . Tachycardia 04/01/2018  . 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 Unstable angina ([place]) 09/20/2015  . Chest pain, rule out acute myocardial infarction 09/18/2015  . Diabetes mellitus type 2 with peripheral artery disease ([place]) 09/18/2015  . Hyponatremia 09/18/2015  . Chest pain on exertion 09/18/2015  . Varicose veins of bilateral lower extremities with other complications 78/67/6720  . Occlusion and stenosis of carotid artery without mention of cerebral infarction 10/18/2011  . SINUSITIS, ACUTE 03/13/2010  . BACK PAIN 11/13/2009  . FATIGUE 10/17/2009  . Diabetic neuropathy associated with diabetes mellitus due to underlying condition ([place]) 08/29/2009  . Vitamin D deficiency 08/29/2009  . Hyposmolality and/or hyponatremia 08/29/2009  . VISUAL ACUITY, DECREASED, LEFT EYE 08/29/2009  . PERIPHERAL VASCULAR DISEASE 08/29/2009  .  SINUSITIS, CHRONIC 08/29/2009  . NEPHROLITHIASIS 08/29/2009  . FIBROMYALGIA 08/29/2009  . BICEPS TENDON RUPTURE, RIGHT 08/11/2009  . DIABETES MELLITUS, TYPE II 08/05/2009  . Hyperlipidemia 08/05/2009  . ANEMIA-NOS 08/05/2009  . ANXIETY 08/05/2009  . DEPRESSION 08/05/2009  . Chronic pain syndrome 08/05/2009  . Essential hypertension 08/05/2009  . GERD 08/05/2009  . BENIGN PROSTATIC HYPERTROPHY 08/05/2009  . [place] DISEASE, LUMBAR 08/05/2009  . Osteoporosis 08/05/2009    Past Surgical History:  Procedure Laterality Date  . CARDIAC CATHETERIZATION N/A 09/19/2015   Procedure: Left Heart Cath and Coronary Angiography;  Surgeon: Adrian Prows, MD;   Location: [facility];  Service: Cardiovascular;  Laterality: N/A;  . CARPAL TUNNEL RELEASE    . CORONARY ARTERY BYPASS GRAFT N/A 09/22/2015   Procedure: CORONARY ARTERY BYPASS GRAFTING (CABG) times four using the right greater saphenous vein harvested endoscopically and the left internal mammary artery.  LIMA-LAD, SEQ SVG-DIAG & OM, SVG-PD.;  Surgeon: [name], MD;  Location: [facility];  Service: Open Heart Surgery;  Laterality: N/A;  . EYE SURGERY    . I&D EXTREMITY Right 04/07/2018   Procedure: IRRIGATION AND DEBRIDEMENT ABSCESS RIGHT LEG;  Surgeon: Newt Minion, MD;  Location: [facility];  Service: Orthopedics;  Laterality: Right;  . L foot open repair jones fracture  2010    5th metetarsal   . left ankle ganglion cyst  1976  . left cataract  1996   right - 1997  . left CTS  2006   R CTS - 2007  . left foot surgery  1998   R surgery/fracture - 1999  . left wrist/hand fusion  2008  . [place], 2004  . NASAL SINUS SURGERY     multiple- x8. last 1997 with obliteration  . REPAIR THORACIC AORTA  2017  . right hand fracture  1969  . ROTATOR CUFF REPAIR  2006   R, than repeat 2011, Dr Theda Sers  . TRIGGER FINGER RELEASE Left 11/11/2014   Procedure: LEFT LONG FINGER RELEASE TRIGGER FINGER/A-1 PULLEY;  Surgeon: Milly Jakob, MD;  Location: [facility];  Service: Orthopedics;  Laterality: Left;  . vocal surgery  1996        Home Medications    Prior to Admission medications   Medication Sig Start Date End Date Taking? Authorizing Provider  ACCU-CHEK FASTCLIX LANCETS MISC Use as directed to check blood glucose. Dx: E11.51 01/17/18   [name], NP  Blood Glucose Monitoring Suppl (ACCU-CHEK AVIVA PLUS) w/Device KIT Use as directed to check blood glucose daily. Dx: E11.51 01/17/18   [name], NP  buPROPion (WELLBUTRIN XL) 300 MG 24 hr tablet Take 300 mg by mouth every morning.  10/12/11   [provider]  celecoxib (CELEBREX) 200  MG capsule TAKE 1 CAPSULE(200 MG) BY MOUTH TWICE DAILY 03/20/18   [name], DO  Cholecalciferol (VITAMIN D) 1000 UNITS capsule Take 5,000 Units by mouth daily.     [provider]  clonazePAM (KLONOPIN) 0.5 MG tablet Take 1 mg by mouth daily at 12 noon.     [provider]  clopidogrel (PLAVIX) 75 MG tablet Take 75 mg by mouth daily.    [provider]  cyclobenzaprine (FLEXERIL) 10 MG tablet Take 10 mg by mouth 3 (three) times daily as needed for muscle spasms.  05/05/17   [provider]  desonide (DESOWEN) 0.05 % cream Apply 1 application topically daily as needed ([place]).    [provider]  doxycycline (VIBRAMYCIN) 100 MG capsule Take 1 capsule (100 mg total) by mouth 2 (two) times daily. 04/12/18   Rayburn, Neta Mends, PA-C  fentaNYL (DURAGESIC - DOSED MCG/HR) 50 MCG/HR Place 50 mcg onto the skin every 3 (three) days.    [provider]  furosemide (LASIX) 20 MG tablet Take 20 mg by mouth as directed. 20 mg every AM and 53m every PM    [provider]  gabapentin (NEURONTIN) 400 MG capsule Take 800 mg by mouth at bedtime.     [provider]  glucose blood (ACCU-CHEK AVIVA PLUS) test strip 1 each by Other route as needed for other. Use as instructed. Dx: E11.51 01/17/18   ELauree Chandler NP  HYDROmorphone (DILAUDID) 2 MG tablet Take 2 mg by mouth every 6 (six) hours as needed for severe pain.     [provider]  JANUVIA 100 MG tablet TAKE 1 TABLET BY MOUTH EVERY DAY 02/20/18   CGildardo Cranker DO  losartan (COZAAR) 25 MG tablet Take 25 mg by mouth daily.    [provider]  mineral oil-hydrophilic petrolatum (AQUAPHOR) ointment Apply topically 2 (two) times daily. Apply to lower extremities twice daily for dry skin until resolved. 04/13/18   Ngetich, DNelda Bucks NP  Multiple Vitamins-Iron (MULTIVITAMINS WITH IRON) TABS tablet Take 1 tablet by mouth daily.    [provider]  nitroGLYCERIN  (NITROSTAT) 0.4 MG SL tablet Place 0.4 mg under the tongue every 5 (five) minutes as needed for chest pain.    [provider]  omeprazole (PRILOSEC) 40 MG capsule Take 1 capsule (40 mg total) by mouth daily. 12/21/17   CGildardo Cranker DO  potassium chloride (K-DUR) 10 MEQ tablet Take 10 mEq by mouth 2 (two) times daily.    [provider]  PROAIR HFA 108 (90 BASE) MCG/ACT inhaler Inhale 2 puffs into the lungs every 6 (six) hours as needed.  03/12/14   [provider]  rOPINIRole (REQUIP) 1 MG tablet Take 2 mg by mouth 3 times/day as needed-between meals & bedtime (restless legs).     [provider]  tamsulosin (FLOMAX) 0.4 MG CAPS capsule Take 1 capsule (0.4 mg total) by mouth 2 (two) times daily. 12/21/17   CGildardo Cranker DO  VITAMIN E PO Take 8,000 Units by mouth daily.    [provider]  zonisamide (ZONEGRAN) 50 MG capsule TAKE 3 CAPSULES(150 MG) BY MOUTH AT BEDTIME 04/14/18   CGildardo Cranker DO    Family History Family History  Problem Relation Age of Onset  . Lung cancer Father 585 . Diabetes Mother   . Osteoporosis Mother   . Throat cancer Unknown        family hx - "bad living" - also lung CA, heart disease and renal failure    . Diabetes Sister   . Suicidality Son 463 . Lung cancer Maternal Grandmother   . Diabetes Maternal Grandfather   . Heart attack Paternal Grandmother     Social History Social History   Tobacco Use  . Smoking status: Former Smoker    Packs/day: 1.50    Years: 30.00    Pack years: 45.00    Types: Cigarettes    Last attempt to quit: 11/06/1988    Years since quitting: 29.4  . Smokeless tobacco: Never Used  Substance Use Topics  . Alcohol use: Not Currently    Comment: None in 30 years but drank beer before  . Drug use: No  Allergies   Ivp dye [iodinated diagnostic agents]; Penicillins; Sulfa antibiotics; Sulfonamide derivatives; Lisinopril; Statins; and Propoxyphene n-acetaminophen   Review of  Systems Review of Systems  Constitutional: Negative for fever.  HENT: Negative for sore throat.   Eyes: Negative for visual disturbance.  Respiratory: Negative for shortness of breath.   Cardiovascular: Negative for chest pain.  Gastrointestinal: Negative for abdominal pain.  Genitourinary: Negative for dysuria.  Musculoskeletal: Positive for neck pain.  Skin: Negative for rash.  Neurological: Positive for headaches.     Physical Exam Updated Vital Signs There were no vitals taken for this visit.  Physical Exam  Constitutional: He appears well-developed and well-nourished.  HENT:  Head: Normocephalic.  He has some bruising around his left ear and a few superficial abrasions to his forehead and left cheek.  No particular depressions or crepitus.  Eyes: Conjunctivae are normal.  Neck: Neck supple.  Cardiovascular: Normal rate and regular rhythm.  No murmur heard. Pulmonary/Chest: Effort normal and breath sounds normal. No respiratory distress.  Abdominal: Soft. There is no tenderness.  Musculoskeletal: He exhibits edema.  He has edema on his right leg with some diffuse tenderness.  Sounds like this is been chronic since his heart surgery with his vein harvest.  He last had a duplex less than a month ago that was negative for DVT.  Neurological: He is alert. He has normal strength. GCS eye subscore is 4. GCS verbal subscore is 5. GCS motor subscore is 6.  Skin: Skin is warm and dry.  Psychiatric: He has a normal mood and affect.  Nursing note and vitals reviewed.    ED Treatments / Results  Labs (all labs ordered are listed, but only abnormal results are displayed) Labs Reviewed  BASIC METABOLIC PANEL - Abnormal; Notable for the following components:      Result Value   Sodium 133 (*)    Chloride 97 (*)    Glucose, Bld 141 (*)    All other components within normal limits  CBC WITH DIFFERENTIAL/PLATELET - Abnormal; Notable for the following components:   RBC 3.84 (*)     Hemoglobin 11.8 (*)    HCT 36.6 (*)    Lymphs Abs 0.5 (*)    All other components within normal limits  URINALYSIS, ROUTINE W REFLEX MICROSCOPIC  I-STAT TROPONIN, ED  I-STAT CG4 LACTIC ACID, ED  I-STAT CG4 LACTIC ACID, ED    EKG EKG Interpretation  Date/Time:  Friday April 28 2018 18:26:35 EDT Ventricular Rate:  72 PR Interval:    QRS Duration: 128 QT Interval:  403 QTC Calculation: 441 R Axis:   67 Text Interpretation:  Sinus rhythm Multiple premature complexes, vent & supraven Nonspecific intraventricular conduction delay Abnormal inferior Q waves Minimal ST depression, lateral leads similar to prior 4/18 Confirmed by Aletta Edouard [phone]) on 04/28/2018 6:32:10 PM Also confirmed by Aletta Edouard [phone]), editor Abelardo Diesel [phone])  on 04/29/2018 10:30:13 AM   Radiology Dg Chest 1 View  Result Date: 04/28/2018 CLINICAL DATA:  Fatigue fell out of chair EXAM: CHEST  1 VIEW COMPARISON:  04/02/2018, 01/17/2018 FINDINGS: Post sternotomy changes. Cardiomegaly with vascular congestion. No acute opacity or pleural effusion. No pneumothorax. Advanced arthritis of the shoulders. IMPRESSION: No active disease.  Cardiomegaly Electronically Signed   By: Donavan Foil M.D.   On: 04/28/2018 19:52   Ct Head Wo Contrast  Result Date: 04/28/2018 CLINICAL DATA:  Fall out of chair hitting left side of head/face. EXAM: CT HEAD WITHOUT CONTRAST CT CERVICAL SPINE WITHOUT  CONTRAST TECHNIQUE: Multidetector CT imaging of the head and cervical spine was performed following the standard protocol without intravenous contrast. Multiplanar CT image reconstructions of the cervical spine were also generated. COMPARISON:  Head CT 05/16/2014 and neck CT 03/09/2011 as well as brain MRI 12/12/2016 FINDINGS: CT HEAD FINDINGS Brain: Ventricles, cisterns and other CSF spaces are within normal. There is chronic ischemic microvascular disease. No mass, mass effect, shift of midline structures or acute hemorrhage. No  evidence of acute infarction. Vascular: No hyperdense vessel or unexpected calcification. Skull: Evidence of previous frontal sinus surgery. No fracture Sinuses/Orbits: Orbits are normal and symmetric. Postsurgical change over the frontal sinus with complete opacification of the frontal sinus. Opacification over the ethmoid air cells and right maxillary sinus unchanged. Other: None. CT CERVICAL SPINE FINDINGS Alignment: Normal. Skull base and vertebrae: Vertebral body heights are normal. There is mild spondylosis throughout the cervical spine. Atlantoaxial articulation is within normal. There is minimal uncovertebral joint spurring. Very minimal facet arthropathy. Subtle fracture along the C5 right transverse process at the posterior border of the foramen transversarium. Additional subtle fracture involving the right transverse process of C4 along the lateral border of the foramen transversarium. No significant changes in the adjacent soft tissues to suggest vertebral artery injury. Soft tissues and spinal canal: No prevertebral fluid or swelling. No visible canal hematoma. Disc levels:  Within normal. Upper chest: Within normal. Other: None. IMPRESSION: No acute brain injury. Mild chronic ischemic microvascular disease. Possible subtle fracture along the right C5 transverse process involving the posterior aspect of the foramen transversarium as well as subtle fracture involving the right transverse process of C4 involving the lateral border of the foramen transversarium. Mild degenerate change of the cervical spine. Postsurgical change of the frontal sinus with chronic frontal sinus opacification and chronic changes of the paranasal sinuses. These results were called by telephone at the time of interpretation on 04/28/2018 at 8:00 pm to Dr. Aletta Edouard , who verbally acknowledged these results. Electronically Signed   By: Marin Olp M.D.   On: 04/28/2018 20:04   Ct Cervical Spine Wo Contrast  Result Date:  04/28/2018 CLINICAL DATA:  Fall out of chair hitting left side of head/face. EXAM: CT HEAD WITHOUT CONTRAST CT CERVICAL SPINE WITHOUT CONTRAST TECHNIQUE: Multidetector CT imaging of the head and cervical spine was performed following the standard protocol without intravenous contrast. Multiplanar CT image reconstructions of the cervical spine were also generated. COMPARISON:  Head CT 05/16/2014 and neck CT 03/09/2011 as well as brain MRI 12/12/2016 FINDINGS: CT HEAD FINDINGS Brain: Ventricles, cisterns and other CSF spaces are within normal. There is chronic ischemic microvascular disease. No mass, mass effect, shift of midline structures or acute hemorrhage. No evidence of acute infarction. Vascular: No hyperdense vessel or unexpected calcification. Skull: Evidence of previous frontal sinus surgery. No fracture Sinuses/Orbits: Orbits are normal and symmetric. Postsurgical change over the frontal sinus with complete opacification of the frontal sinus. Opacification over the ethmoid air cells and right maxillary sinus unchanged. Other: None. CT CERVICAL SPINE FINDINGS Alignment: Normal. Skull base and vertebrae: Vertebral body heights are normal. There is mild spondylosis throughout the cervical spine. Atlantoaxial articulation is within normal. There is minimal uncovertebral joint spurring. Very minimal facet arthropathy. Subtle fracture along the C5 right transverse process at the posterior border of the foramen transversarium. Additional subtle fracture involving the right transverse process of C4 along the lateral border of the foramen transversarium. No significant changes in the adjacent soft tissues to suggest vertebral artery  injury. Soft tissues and spinal canal: No prevertebral fluid or swelling. No visible canal hematoma. Disc levels:  Within normal. Upper chest: Within normal. Other: None. IMPRESSION: No acute brain injury. Mild chronic ischemic microvascular disease. Possible subtle fracture along the  right C5 transverse process involving the posterior aspect of the foramen transversarium as well as subtle fracture involving the right transverse process of C4 involving the lateral border of the foramen transversarium. Mild degenerate change of the cervical spine. Postsurgical change of the frontal sinus with chronic frontal sinus opacification and chronic changes of the paranasal sinuses. These results were called by telephone at the time of interpretation on 04/28/2018 at 8:00 pm to Dr. Aletta Edouard , who verbally acknowledged these results. Electronically Signed   By: Marin Olp M.D.   On: 04/28/2018 20:04    Procedures Procedures (including critical care time)  Medications Ordered in ED Medications - No data to display   Initial Impression / Assessment and Plan / ED Course  I have reviewed the triage vital signs and the nursing notes.  Pertinent labs & imaging results that were available during my care of the patient were reviewed by me and considered in my medical decision making (see chart for details).  Clinical Course as of Apr 29 1099  Fri Apr 28, 6757  4377 72 year old male on chronic narcotics here after falling asleep at home and fell out of his chair striking his head.  He is got some head and neck pain.  Aborted CT imaging of his head and his C-spine along with some basic lab work.   [MB]  6803 Patient's blood pressure is low here and he is easily arousable but seems very sleepy and a little slurred with his speech.  We are going to take off his fentanyl patch and give him some IV fluids.  Possibly that he is just overmedicated.   [MB]  2058 Gust with Dr. Venetia Constable from neurosurgery who will review the films and get back to me.   [MB]  2106 Dr. Zada Finders reviewed the CAT scans and he feels these are stable and do not need any interventions and do not even need a collar.  He can follow-up in the office as an outpatient if needed.   [MB]  2137 Patient's blood pressure  remains soft here but he does not have any particular complaints and is easily arousable.  He has had 2 lactate Cinoman normal and he still otherwise just seems overmedicated.   [MB]  2215 Reevaluated patient and took off the collar.  He is got no particular point tenderness on palpation and he has full range of motion of his neck.  His blood pressure is now improved since they switched arms and taking it and he said he always has chronic low blood pressure on the side that was on before.  He is awake alert now and denies any complaints he would take a [place] home.  I do not see any obvious indications for medical admission at this time.   [MB]  2216 Will get him up and walking to make sure he stable on his feet.   [MB]    Clinical Course User Index [MB] Hayden Rasmussen, MD     Final Clinical Impressions(s) / ED Diagnoses   Final diagnoses:  Fall, initial encounter  Contusion of face, initial encounter  Closed fracture of transverse process of cervical vertebra, initial encounter [facility])    ED Discharge Orders    None  Hayden Rasmussen, MD 04/29/18 1100

## 2018-04-28 NOTE — Telephone Encounter (Signed)
Dondra Spry with [facility] called and wanted to let Dr. Sharol Given know that they will be going to see patient on Saturday, 04/29/2018 to start [place].

## 2018-04-28 NOTE — ED Triage Notes (Signed)
[facility] reports pt was asleep and slid out of chair hitting left side with superficial lacs ,reports weakness and fatigue since yesterday. In home care service called ems. Pt known to have prn morphine, on plavix.  cbg 80, then 109 Recently hospitalized for rt leg swelling.

## 2018-04-28 NOTE — ED Notes (Signed)
Patient transported to CT 

## 2018-04-28 NOTE — ED Notes (Signed)
This RN and Levada Dy, Public house manager, disposed of fentanyl patches in sharps pt was wearing. One was removed by Earnest Bailey, EMT per request of Dr. Melina Copa. pt arrived, the other was removed by the patient.

## 2018-04-28 NOTE — Discharge Instructions (Signed)
You were evaluated in the emergency department after a fall out of your chair when he fell asleep.  You had some bruising about your face but more importantly you have some fractures in your cervical spine.  We reviewed this with neurosurgery and he said there is nothing to do for this and they will heal on their own.  If you have continued problems with your neck you should follow-up with Dr. Venetia Constable or his clinic.  Please return if any worsening symptoms.

## 2018-05-01 ENCOUNTER — Other Ambulatory Visit: Payer: Self-pay | Admitting: Nurse Practitioner

## 2018-05-01 ENCOUNTER — Telehealth: Payer: Self-pay | Admitting: *Deleted

## 2018-05-01 NOTE — Telephone Encounter (Signed)
Patient called requesting refill on medication. (Jessica's Patient). Medication has a high alert warning. Pended and sent to Dr. Mariea Clonts for approval.

## 2018-05-01 NOTE — Telephone Encounter (Signed)
This is hard to do w/o ever having met him.  I see he's on several psychiatric meds plus his pain mgt.  Ideally, he should be seen in our office so his meds can be adjusted as soon as possible.  Jessica's name isn't even on his chart.  It looks like he's seen Dinah once.

## 2018-05-01 NOTE — Telephone Encounter (Signed)
Pam with [facility] called and stated that she is seeing patient due to Dr. Sharol Given placing orders for wound care but while she was in home on Friday she has a lot of concern with patient over medicating. Stated that he uses what medication he has at the time for his pain management. Stated that patient cannot even stay awake and when he gets up from his chair he falls. Stated that he just got out of the ER for this. Stated that patient told her that he had a fractured neck. Nurse is concerned and wants you to know that his medications needed to be reduced. Please Advise.  (Jessica's Patient)   Nurse saw on Friday and vitals were:  BP:120/60, P: 88, R: 18, T: 98.6 Pain was a 5/10

## 2018-05-02 NOTE — Telephone Encounter (Signed)
Called and spoke with Pam with [facility] [phone] and informed her that patient needed an appointment and she agreed.   Tried calling patient and had to [facility] to return call. (When patient returns call Caren Griffins also has a Wound Care Appointment to give to him)

## 2018-05-03 ENCOUNTER — Other Ambulatory Visit: Payer: Self-pay | Admitting: *Deleted

## 2018-05-03 ENCOUNTER — Telehealth: Payer: Self-pay | Admitting: *Deleted

## 2018-05-03 NOTE — Patient Outreach (Addendum)
[facility]) Care Management  05/03/2018  [name] March 31, 1946 [id number]    Transition of care/post ED visit  RN spoke with pt and inquired further on the recent ED visit. Pt states he had a falls from his chair and hit his head. States they completed a scan and found some "piece of bone chips that had broken off". Pt states he was told "nothing to worry about" but told he would need to see a neurosurgeon. Pt states he left his paperwork in the cab that transported him home. States there were no medication changes. RN offered to assist and contact his primary provider to make this request ( pt receptive). RN also updated the current plan of care and added fall prevention due to the recent fall. Will adjust interventions accordingly based upon pt's progress.  Pt also indicated his supplies arrived yesterday and he will contact the nurse with the [facility] agency to start wound care to his heals based upon receival of the supplies. Pt states the RLE is healed as he continue to wear his compression stockings with no additional drainage. Will follow up on the heal wounds next week being addressed by the [facility] agency for wound care.   RN contacted [facility] and made office aware of pt's recent ED visit and recommendations for pt to consult a neurosurgeon. Provider both the pt's and this RN case manager's contact number for these arrangements.   Will follow up next week for ongoing transition of care contacts and update care plan according to pt's progress. No additional request or inquires at this time. Will continue to encourage adherence to the current plan of care in place.  [name], RN Care Management Coordinator [place] Office [phone]

## 2018-05-03 NOTE — Telephone Encounter (Signed)
[name] with [facility] called and stated that patient stated that he needed a Neurosurgeon Referral. Nurse stated that the patient was called a cab to pick him up from hospital and when he got home he forgot his paperwork in the cab. Recently in the ER from a fall and had hit his head.   I called patient and scheduled him an appointment for tomorrow with Dinah to be seen in office. Patient agreed.

## 2018-05-03 NOTE — Telephone Encounter (Signed)
Patient scheduled an appointment with Dinah tomorrow.

## 2018-05-04 ENCOUNTER — Ambulatory Visit: Payer: PRIVATE HEALTH INSURANCE | Admitting: *Deleted

## 2018-05-04 ENCOUNTER — Telehealth: Payer: Self-pay

## 2018-05-04 ENCOUNTER — Ambulatory Visit: Payer: Self-pay | Admitting: Family

## 2018-05-04 NOTE — Telephone Encounter (Signed)
Noted  

## 2018-05-04 NOTE — Telephone Encounter (Signed)
Left message on voicemail for patient to return call when available, reason for call: Patient was scheduled to see Dinah, NP and missed appointment (ER follow-up), needs to reschedule

## 2018-05-08 NOTE — Telephone Encounter (Signed)
Patient has follow-up scheduled with Sherrie Mustache, NP for November

## 2018-05-10 ENCOUNTER — Telehealth: Payer: Self-pay

## 2018-05-10 ENCOUNTER — Encounter (HOSPITAL_COMMUNITY): Payer: Self-pay | Admitting: Emergency Medicine

## 2018-05-10 ENCOUNTER — Ambulatory Visit (HOSPITAL_COMMUNITY)
Admission: EM | Admit: 2018-05-10 | Discharge: 2018-05-10 | Disposition: A | Discharge status: Home / Self Care | Payer: PRIVATE HEALTH INSURANCE | Attending: Family Medicine | Admitting: Family Medicine

## 2018-05-10 ENCOUNTER — Ambulatory Visit (INDEPENDENT_AMBULATORY_CARE_PROVIDER_SITE_OTHER): Payer: PRIVATE HEALTH INSURANCE

## 2018-05-10 DIAGNOSIS — M25572 Pain in left ankle and joints of left foot: Secondary | ICD-10-CM

## 2018-05-10 DIAGNOSIS — S93105A Unspecified dislocation of left toe(s), initial encounter: Secondary | ICD-10-CM

## 2018-05-10 NOTE — Discharge Instructions (Addendum)
Please keep your follow up appointment tomorrow with your orthopaedist. Be sure to show him these papers and let him know you had x-rays done here tonight. We did not see any broken bones.

## 2018-05-10 NOTE — Telephone Encounter (Signed)
Pt sent to [facility]

## 2018-05-10 NOTE — Telephone Encounter (Signed)
Patient walked in c/o pain and swelling of left foot/ankle. Patient with decreased mobility as a result of pain and swelling. Patient fell 2 days ago and possible injured foot.    Patient states he has a cut on both heals and was told he has an infection. Patient currently on ABX prescribed while in the hospital for opposite foot.   Patient waiting in exam room  Please advise

## 2018-05-10 NOTE — ED Triage Notes (Signed)
Pt states he was propping his left foot up a few days ago and it fell and hit the floor, c/o ongoing L foot pain.

## 2018-05-11 ENCOUNTER — Ambulatory Visit (INDEPENDENT_AMBULATORY_CARE_PROVIDER_SITE_OTHER): Payer: PRIVATE HEALTH INSURANCE | Admitting: Orthopedic Surgery

## 2018-05-11 ENCOUNTER — Encounter (INDEPENDENT_AMBULATORY_CARE_PROVIDER_SITE_OTHER): Payer: Self-pay | Admitting: Orthopedic Surgery

## 2018-05-11 ENCOUNTER — Other Ambulatory Visit: Payer: Self-pay | Admitting: *Deleted

## 2018-05-11 VITALS — Ht 72.0 in | Wt 209.0 lb

## 2018-05-11 DIAGNOSIS — E08622 Diabetes mellitus due to underlying condition with other skin ulcer: Secondary | ICD-10-CM

## 2018-05-11 DIAGNOSIS — L97421 Non-pressure chronic ulcer of left heel and midfoot limited to breakdown of skin: Secondary | ICD-10-CM

## 2018-05-11 DIAGNOSIS — E08621 Diabetes mellitus due to underlying condition with foot ulcer: Secondary | ICD-10-CM

## 2018-05-11 DIAGNOSIS — E1142 Type 2 diabetes mellitus with diabetic polyneuropathy: Secondary | ICD-10-CM

## 2018-05-11 DIAGNOSIS — E440 Moderate protein-calorie malnutrition: Secondary | ICD-10-CM

## 2018-05-11 DIAGNOSIS — L97912 Non-pressure chronic ulcer of unspecified part of right lower leg with fat layer exposed: Secondary | ICD-10-CM

## 2018-05-11 NOTE — Patient Outreach (Addendum)
[facility]) Care Management  05/11/2018  [name] 1946-06-23 [id number]    [place] ED visit on 9/25/Transition of care  Unsuccessful attempt to reach pt today to inquire further on his recent ED visit (closure dislocation to toe on left foot).  Able to leave a HIPAA approved voice message requesting a call back. Will rescheduled another outreach call next week for ongoing transition of care.  [name], RN Care Management Coordinator [place] Office [phone]

## 2018-05-12 ENCOUNTER — Ambulatory Visit: Payer: Self-pay | Admitting: *Deleted

## 2018-05-12 NOTE — Progress Notes (Signed)
Patient came back into office today 05/12/18 with concerns of his Prafo boot; informed patient to come back in and reschedule appt for discomfort of boot and concerns. He scheduled for 05/18/18 to see Dr Sharol Given.

## 2018-05-15 ENCOUNTER — Encounter (INDEPENDENT_AMBULATORY_CARE_PROVIDER_SITE_OTHER): Payer: Self-pay | Admitting: Orthopedic Surgery

## 2018-05-15 NOTE — Progress Notes (Signed)
Office Visit Note   Patient: [name]           Date of Birth: 1946-03-03           MRN: [id number] Visit Date: 05/11/2018              Requested by: [name], [address] [place], [place] [id number] PCP: [name], DO  Chief Complaint  Patient presents with  . Right Foot - Follow-up  . Left Foot - Follow-up      HPI: Patient is a 72 year old gentleman who presents 1 month status post irrigation debridement abscess right leg.  Patient has a decubitus heel ulcer on the left wearing a PRAFO boot on the left.  Assessment & Plan: Visit Diagnoses:  1. 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 Diabetic ulcer of right lower leg associated with diabetes mellitus due to underlying condition, with fat layer exposed ([place])   3. Protein-calorie malnutrition, moderate ([place])   4. Diabetic polyneuropathy associated with type 2 diabetes mellitus ([place])     Plan: Continue with the PRAFO on the left.  Recommended continue wearing compression socks for both lower extremities.  Follow-Up Instructions: Return in about 3 weeks (around 06/01/2018).   Ortho Exam  Patient is alert, oriented, no adenopathy, well-dressed, normal affect, normal respiratory effort. Examination patient's left heel ulcer is stable.  No signs of progression he has palpable pulses.  The venous stasis ulcer on the right is showing interval healing.  Continue with compression stockings.  Imaging: No results found. No images are attached to the encounter.  Labs: Lab Results  Component Value Date   HGBA1C 6.6 (H) 03/21/2018   HGBA1C 6.6 12/01/2017   HGBA1C 6.5 07/28/2017   ESRSEDRATE 30 (H) 12/11/2016   ESRSEDRATE 12 10/17/2009   CRP <0.8 12/11/2016   REPTSTATUS 12/16/2016 FINAL 12/12/2016   GRAMSTAIN NO WBC SEEN NO ORGANISMS SEEN  12/12/2016   CULT  12/12/2016    RARE STAPHYLOCOCCUS AUREUS WITHIN NORMAL  SKIN FLORA    LABORGA STAPHYLOCOCCUS AUREUS 12/12/2016     Lab Results  Component Value Date   ALBUMIN 2.6 (L) 04/02/2018   ALBUMIN 3.1 (L) 04/01/2018   ALBUMIN 3.5 12/11/2016    Body mass index is 28.35 kg/m.  Orders:  No orders of the defined types were placed in this encounter.  No orders of the defined types were placed in this encounter.    Procedures: No procedures performed  Clinical Data: No additional findings.  ROS:  All other systems negative, except as noted in the HPI. Review of Systems  Objective: Vital Signs: Ht 6' (1.829 m)   Wt 209 lb (94.8 kg)   BMI 28.35 kg/m   Specialty Comments:  No specialty comments available.  PMFS History: Patient Active Problem List   Diagnosis Date Noted  . Abscess of right lower leg   . Severe protein-calorie malnutrition ([place])   . 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 Diabetic polyneuropathy associated with type 2 diabetes mellitus ([place])   . Cellulitis 04/01/2018  . Cellulitis of right lower extremity 04/01/2018  . Tachycardia 04/01/2018  . 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 Unstable angina ([place]) 09/20/2015  . Chest pain, rule out acute myocardial infarction 09/18/2015  . Diabetes mellitus type 2 with peripheral artery disease ([place]) 09/18/2015  . Hyponatremia 09/18/2015  . Chest pain on  exertion 09/18/2015  . Varicose veins of bilateral lower extremities with other complications 86/76/7209  . Occlusion and stenosis of carotid artery without mention of cerebral infarction 10/18/2011  . SINUSITIS, ACUTE 03/13/2010  . BACK PAIN 11/13/2009  . FATIGUE 10/17/2009  . Diabetic neuropathy associated with diabetes mellitus due to underlying condition ([place]) 08/29/2009  . Vitamin D deficiency 08/29/2009  . Hyposmolality and/or hyponatremia 08/29/2009  . VISUAL ACUITY, DECREASED, LEFT EYE 08/29/2009  . PERIPHERAL VASCULAR DISEASE 08/29/2009  . SINUSITIS, CHRONIC 08/29/2009  .  NEPHROLITHIASIS 08/29/2009  . FIBROMYALGIA 08/29/2009  . BICEPS TENDON RUPTURE, RIGHT 08/11/2009  . DIABETES MELLITUS, TYPE II 08/05/2009  . Hyperlipidemia 08/05/2009  . ANEMIA-NOS 08/05/2009  . ANXIETY 08/05/2009  . DEPRESSION 08/05/2009  . Chronic pain syndrome 08/05/2009  . Essential hypertension 08/05/2009  . GERD 08/05/2009  . BENIGN PROSTATIC HYPERTROPHY 08/05/2009  . [place] DISEASE, LUMBAR 08/05/2009  . Osteoporosis 08/05/2009   Past Medical History:  Diagnosis Date  . Anemia    NOS  . Anxiety   . 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 BPH (benign prostatic hypertrophy)   . Broken foot Oct. 12, 2013   Right foot Fx  . CAD (coronary artery disease)   . Cellulitis and abscess of leg 03/2018   right leg  . Chronic hyponatremia   . Chronic pain syndrome   . COPD (chronic obstructive pulmonary disease) ([place])   . Depression   . Diabetes mellitus   . Diabetic foot ulcer ([place])   . DJD (degenerative joint disease)    L wrist  . DM2 (diabetes mellitus, type 2) ([place])   . GERD (gastroesophageal reflux disease)   . HLD (hyperlipidemia)   . HTN (hypertension)   . Lumbar disc disease   . Osteoporosis    tx per [facility], Dr Prudencio Burly, thought due to heavy steriod use after 1978  . Spine fracture    hx, multiple  . Varicose veins     Family History  Problem Relation Age of Onset  . Lung cancer Father 30  . Diabetes Mother   . Osteoporosis Mother   . Throat cancer Unknown        family hx - "bad living" - also lung CA, heart disease and renal failure    . Diabetes Sister   . Suicidality Son 20  . Lung cancer Maternal Grandmother   . Diabetes Maternal Grandfather   . Heart attack Paternal Grandmother     Past Surgical History:  Procedure Laterality Date  . CARDIAC CATHETERIZATION N/A 09/19/2015   Procedure: Left Heart Cath and Coronary Angiography;  Surgeon: Adrian Prows, MD;  Location: [facility];  Service: Cardiovascular;  Laterality: N/A;  . CARPAL  TUNNEL RELEASE    . CORONARY ARTERY BYPASS GRAFT N/A 09/22/2015   Procedure: CORONARY ARTERY BYPASS GRAFTING (CABG) times four using the right greater saphenous vein harvested endoscopically and the left internal mammary artery.  LIMA-LAD, SEQ SVG-DIAG & OM, SVG-PD.;  Surgeon: [name], MD;  Location: [facility];  Service: Open Heart Surgery;  Laterality: N/A;  . EYE SURGERY    . I&D EXTREMITY Right 04/07/2018   Procedure: IRRIGATION AND DEBRIDEMENT ABSCESS RIGHT LEG;  Surgeon: Newt Minion, MD;  Location: [facility];  Service: Orthopedics;  Laterality: Right;  . L foot open repair jones fracture  2010    5th metetarsal   . left ankle ganglion cyst  1976  . left cataract  1996  right - 1997  . left CTS  2006   R CTS - 2007  . left foot surgery  1998   R surgery/fracture - 1999  . left wrist/hand fusion  2008  . [place], 2004  . NASAL SINUS SURGERY     multiple- x8. last 1997 with obliteration  . REPAIR THORACIC AORTA  2017  . right hand fracture  1969  . ROTATOR CUFF REPAIR  2006   R, than repeat 2011, Dr Theda Sers  . TRIGGER FINGER RELEASE Left 11/11/2014   Procedure: LEFT LONG FINGER RELEASE TRIGGER FINGER/A-1 PULLEY;  Surgeon: Milly Jakob, MD;  Location: [facility];  Service: Orthopedics;  Laterality: Left;  . vocal surgery  1996   Social History   Occupational History  . Not on file  Tobacco Use  . Smoking status: Former Smoker    Packs/day: 1.50    Years: 30.00    Pack years: 45.00    Types: Cigarettes    Last attempt to quit: 11/06/1988    Years since quitting: 29.5  . Smokeless tobacco: Never Used  Substance and Sexual Activity  . Alcohol use: Not Currently    Comment: None in 30 years but drank beer before  . Drug use: No  . Sexual activity: Not Currently

## 2018-05-16 ENCOUNTER — Encounter (HOSPITAL_BASED_OUTPATIENT_CLINIC_OR_DEPARTMENT_OTHER): Payer: PRIVATE HEALTH INSURANCE

## 2018-05-17 ENCOUNTER — Telehealth: Payer: Self-pay | Admitting: *Deleted

## 2018-05-17 MED ORDER — OMEPRAZOLE 40 MG PO CPDR: DELAYED_RELEASE_CAPSULE | ORAL | 4 refills | Status: DC

## 2018-05-17 NOTE — Telephone Encounter (Signed)
Ok to increase to BID #60 with 4 rf

## 2018-05-17 NOTE — Telephone Encounter (Signed)
Medication list updated.Rx sent to pharmacy.

## 2018-05-17 NOTE — Telephone Encounter (Signed)
Patient stated that he has been taking his Omeprazole twice daily instead of once daily and he has ran out. Patient needs a refill but he wants it for twice daily. Stated that he has been taking twice daily for years. Is it ok to change directions and send to pharmacy. Please Advise.

## 2018-05-18 ENCOUNTER — Ambulatory Visit (INDEPENDENT_AMBULATORY_CARE_PROVIDER_SITE_OTHER): Payer: Medicare Other | Admitting: Orthopedic Surgery

## 2018-05-18 ENCOUNTER — Encounter (INDEPENDENT_AMBULATORY_CARE_PROVIDER_SITE_OTHER): Payer: Self-pay | Admitting: Orthopedic Surgery

## 2018-05-18 VITALS — Ht 72.0 in | Wt 209.0 lb

## 2018-05-18 DIAGNOSIS — I6521 Occlusion and stenosis of right carotid artery: Secondary | ICD-10-CM

## 2018-05-18 DIAGNOSIS — L97421 Non-pressure chronic ulcer of left heel and midfoot limited to breakdown of skin: Secondary | ICD-10-CM

## 2018-05-18 DIAGNOSIS — E08621 Diabetes mellitus due to underlying condition with foot ulcer: Secondary | ICD-10-CM

## 2018-05-18 NOTE — Progress Notes (Signed)
Office Visit Note   Patient: [name]           Date of Birth: 03/27/1946           MRN: [id number] Visit Date: 05/18/2018              Requested by: [name], [address] [place], [place] [id number] PCP: [name], DO  Chief Complaint  Patient presents with  . Right Leg - Routine Post Op    04/07/18 I&D RLE  . Left Foot - Wound Check    Heel ulcer      HPI: Patient is a 72 year old gentleman with diabetic insensate neuropathy with a ulcer on the lateral aspect of the left heel.  Patient has new orthotics he has cut another orthotic in half and is using the forefoot half of the orthotic on top of his current orthotic.  Patient states he cannot safely ambulate in his PRAFO.  Assessment & Plan: Visit Diagnoses:  1. 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  Continue work on heel cord stretching.  Follow-Up Instructions: Return in about 2 weeks (around 06/01/2018).   Ortho Exam  Patient is alert, oriented, no adenopathy, well-dressed, normal affect, normal respiratory effort. Examination patient has decreased swelling in the left lower extremity.  The lateral heel ulcer is 2 cm in diameter 1 mm deep.  The wound bed is 50% fibrinous exudative tissue and 50% granulation tissue.  Patient has a palpable pulse.  He has good dorsiflexion 20 degrees past neutral.  Patient is ambulating in a wheelchair.  Imaging: No results found. No images are attached to the encounter.  Labs: Lab Results  Component Value Date   HGBA1C 6.6 (H) 03/21/2018   HGBA1C 6.6 12/01/2017   HGBA1C 6.5 07/28/2017   ESRSEDRATE 30 (H) 12/11/2016   ESRSEDRATE 12 10/17/2009   CRP <0.8 12/11/2016   REPTSTATUS 12/16/2016 FINAL 12/12/2016   GRAMSTAIN NO WBC SEEN NO ORGANISMS  SEEN  12/12/2016   CULT  12/12/2016    RARE STAPHYLOCOCCUS AUREUS WITHIN NORMAL SKIN FLORA    LABORGA STAPHYLOCOCCUS AUREUS 12/12/2016     Lab Results  Component Value Date   ALBUMIN 2.6 (L) 04/02/2018   ALBUMIN 3.1 (L) 04/01/2018   ALBUMIN 3.5 12/11/2016    Body mass index is 28.35 kg/m.  Orders:  No orders of the defined types were placed in this encounter.  No orders of the defined types were placed in this encounter.    Procedures: No procedures performed  Clinical Data: No additional findings.  ROS:  All other systems negative, except as noted in the HPI. Review of Systems  Objective: Vital Signs: Ht 6' (1.829 m)   Wt 209 lb (94.8 kg)   BMI 28.35 kg/m   Specialty Comments:  No specialty comments available.  PMFS History: Patient Active Problem List   Diagnosis Date Noted  . Abscess of right lower leg   . Severe protein-calorie malnutrition ([place])   . 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 Diabetic polyneuropathy associated with type 2 diabetes mellitus ([place])   . Cellulitis 04/01/2018  . Cellulitis of right lower extremity 04/01/2018  . Tachycardia 04/01/2018  . 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 Unstable  angina ([place]) 09/20/2015  . Chest pain, rule out acute myocardial infarction 09/18/2015  . Diabetes mellitus type 2 with peripheral artery disease ([place]) 09/18/2015  . Hyponatremia 09/18/2015  . Chest pain on exertion 09/18/2015  . Varicose veins of bilateral lower extremities with other complications 93/79/0240  . Occlusion and stenosis of carotid artery without mention of cerebral infarction 10/18/2011  . SINUSITIS, ACUTE 03/13/2010  . BACK PAIN 11/13/2009  . FATIGUE 10/17/2009  . Diabetic neuropathy associated with diabetes mellitus due to underlying condition ([place]) 08/29/2009  . Vitamin D deficiency 08/29/2009  . Hyposmolality and/or hyponatremia 08/29/2009  . VISUAL ACUITY, DECREASED, LEFT EYE  08/29/2009  . PERIPHERAL VASCULAR DISEASE 08/29/2009  . SINUSITIS, CHRONIC 08/29/2009  . NEPHROLITHIASIS 08/29/2009  . FIBROMYALGIA 08/29/2009  . BICEPS TENDON RUPTURE, RIGHT 08/11/2009  . DIABETES MELLITUS, TYPE II 08/05/2009  . Hyperlipidemia 08/05/2009  . ANEMIA-NOS 08/05/2009  . ANXIETY 08/05/2009  . DEPRESSION 08/05/2009  . Chronic pain syndrome 08/05/2009  . Essential hypertension 08/05/2009  . GERD 08/05/2009  . BENIGN PROSTATIC HYPERTROPHY 08/05/2009  . [place] DISEASE, LUMBAR 08/05/2009  . Osteoporosis 08/05/2009   Past Medical History:  Diagnosis Date  . Anemia    NOS  . Anxiety   . 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 BPH (benign prostatic hypertrophy)   . Broken foot Oct. 12, 2013   Right foot Fx  . CAD (coronary artery disease)   . Cellulitis and abscess of leg 03/2018   right leg  . Chronic hyponatremia   . Chronic pain syndrome   . COPD (chronic obstructive pulmonary disease) ([place])   . Depression   . Diabetes mellitus   . Diabetic foot ulcer ([place])   . DJD (degenerative joint disease)    L wrist  . DM2 (diabetes mellitus, type 2) ([place])   . GERD (gastroesophageal reflux disease)   . HLD (hyperlipidemia)   . HTN (hypertension)   . Lumbar disc disease   . Osteoporosis    tx per [facility], Dr Prudencio Burly, thought due to heavy steriod use after 1978  . Spine fracture    hx, multiple  . Varicose veins     Family History  Problem Relation Age of Onset  . Lung cancer Father 49  . Diabetes Mother   . Osteoporosis Mother   . Throat cancer Unknown        family hx - "bad living" - also lung CA, heart disease and renal failure    . Diabetes Sister   . Suicidality Son 50  . Lung cancer Maternal Grandmother   . Diabetes Maternal Grandfather   . Heart attack Paternal Grandmother     Past Surgical History:  Procedure Laterality Date  . CARDIAC CATHETERIZATION N/A 09/19/2015   Procedure: Left Heart Cath and Coronary Angiography;  Surgeon: Adrian Prows,  MD;  Location: [facility];  Service: Cardiovascular;  Laterality: N/A;  . CARPAL TUNNEL RELEASE    . CORONARY ARTERY BYPASS GRAFT N/A 09/22/2015   Procedure: CORONARY ARTERY BYPASS GRAFTING (CABG) times four using the right greater saphenous vein harvested endoscopically and the left internal mammary artery.  LIMA-LAD, SEQ SVG-DIAG & OM, SVG-PD.;  Surgeon: [name], MD;  Location: [facility];  Service: Open Heart Surgery;  Laterality: N/A;  . EYE SURGERY    . I&D EXTREMITY Right 04/07/2018   Procedure: IRRIGATION AND DEBRIDEMENT ABSCESS RIGHT LEG;  Surgeon: Newt Minion, MD;  Location: [facility];  Service: Orthopedics;  Laterality: Right;  . L foot open repair jones fracture  2010    5th metetarsal   . left ankle ganglion cyst  1976  . left cataract  1996   right - 1997  . left CTS  2006   R CTS - 2007  . left foot surgery  1998   R surgery/fracture - 1999  . left wrist/hand fusion  2008  . [place], 2004  . NASAL SINUS SURGERY     multiple- x8. last 1997 with obliteration  . REPAIR THORACIC AORTA  2017  . right hand fracture  1969  . ROTATOR CUFF REPAIR  2006   R, than repeat 2011, Dr Theda Sers  . TRIGGER FINGER RELEASE Left 11/11/2014   Procedure: LEFT LONG FINGER RELEASE TRIGGER FINGER/A-1 PULLEY;  Surgeon: Milly Jakob, MD;  Location: [facility];  Service: Orthopedics;  Laterality: Left;  . vocal surgery  1996   Social History   Occupational History  . Not on file  Tobacco Use  . Smoking status: Former Smoker    Packs/day: 1.50    Years: 30.00    Pack years: 45.00    Types: Cigarettes    Last attempt to quit: 11/06/1988    Years since quitting: 29.5  . Smokeless tobacco: Never Used  Substance and Sexual Activity  . Alcohol use: Not Currently    Comment: None in 30 years but drank beer before  . Drug use: No  . Sexual activity: Not Currently

## 2018-05-18 NOTE — ED Provider Notes (Signed)
[facility]   [id number] 05/10/18 Arrival Time: [place] PLAN:  1. Closed dislocation of fifth toe of left foot, initial encounter   2. Pain of joint of left ankle and foot     Imaging: Dg Ankle Complete Left  Result Date: 05/10/2018 CLINICAL DATA:  Unable to bear weight, known fifth MTP joint dislocation EXAM: LEFT ANKLE COMPLETE - 3+ VIEW COMPARISON:  None. FINDINGS: Generalized soft tissue swelling is noted. No acute fracture or dislocation is seen. Prior surgical fixation of the fifth metatarsal is noted. IMPRESSION: Soft tissue swelling without acute bony abnormality. Electronically Signed   By: Inez Catalina M.D.   On: 05/10/2018 16:48   Dg Foot Complete Left  Result Date: 05/10/2018 CLINICAL DATA:  Left foot injury EXAM: LEFT FOOT - COMPLETE 3+ VIEW COMPARISON:  Left foot radiograph 12/06/2016 FINDINGS: Partially threaded lag screw at the fifth tarsometatarsal joint. There is medial and dorsal dislocation at the fifth metatarsophalangeal joint. No fracture. Bone alignment is otherwise normal. Moderate osteopenia. IMPRESSION: Medial and dorsal dislocation of the fifth metatarsophalangeal joint without acute fracture. Electronically Signed   By: Ulyses Jarred M.D.   On: 05/10/2018 16:15   He has scheduled f/u within 48 hours with orthopaedist to discuss findings on imaging above. No treatment at this time. OTC analgesics if needed.  Reviewed expectations re: course of current medical issues. Questions answered. Outlined signs and symptoms indicating need for more acute intervention. Patient verbalized understanding. After Visit Summary given.  SUBJECTIVE: History from: patient. Axxel Gude is a 72 y.o. male who reports persistent moderate pain of his left foot, generalized, that has stabilized since beginning; described as aching without radiation. Onset: abrupt, a few days ago. Injury/trama: reports that he was propping his left foot up when it slipped  "banging into the floor"; reports immediate discomfort Relieved by: nothing in particular. Worsened by: movement. Associated symptoms: none reported. Extremity sensation changes or weakness: none. Self treatment: tried OTCs with moderate relief of pain "but still hurts"  ROS: As per HPI.   OBJECTIVE:  Vitals:   05/10/18 1517  BP: (!) 126/51  Pulse: 75  Resp: 18  Temp: 98 F (36.7 C)  SpO2: 98%    General appearance: alert; no distress Extremities: warm and well perfused; symmetrical with no gross deformities; farily diffuse and poorly localized tenderness over his left mid to forefoot with moderate swelling and no bruising; normal left ankle ROM CV: brisk extremity capillary refill Skin: warm and dry Neurologic: normal gait; normal symmetric reflexes in all extremities; normal sensation in all extremities Psychological: alert and cooperative; normal mood and affect  Allergies  Allergen Reactions  . Ivp Dye [Iodinated Diagnostic Agents]     Blood Pressure dropped----pt was pre-medicated with 13 hour prep and did fine with pre-meds--amy 03/09/11   . Penicillins Nausea Only    REACTION: stomach pain(IV ok) Has patient had a PCN reaction causing immediate rash, facial/tongue/throat swelling, SOB or lightheadedness with hypotension: No Has patient had a PCN reaction causing severe rash involving mucus membranes or skin necrosis: No Has patient had a PCN reaction that required hospitalization: No Has patient had a PCN reaction occurring within the last 10 years: No If all of the above answers are "NO", then may proceed with Cephalosporin use.   . Sulfa Antibiotics Hives  . Sulfonamide Derivatives Hives and Itching  . Lisinopril Cough  . Statins Other (See Comments)    myalgias  . Propoxyphene N-Acetaminophen Other (See Comments)  Sharp pains- headache    Past Medical History:  Diagnosis Date  . Anemia    NOS  . Anxiety   . 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 BPH (benign prostatic hypertrophy)   . Broken foot Oct. 12, 2013   Right foot Fx  . CAD (coronary artery disease)   . Cellulitis and abscess of leg 03/2018   right leg  . Chronic hyponatremia   . Chronic pain syndrome   . COPD (chronic obstructive pulmonary disease) ([employer])   . Depression   . Diabetes mellitus   . Diabetic foot ulcer ([place])   . DJD (degenerative joint disease)    L wrist  . DM2 (diabetes mellitus, type 2) ([place])   . GERD (gastroesophageal reflux disease)   . HLD (hyperlipidemia)   . HTN (hypertension)   . Lumbar disc disease   . Osteoporosis    tx [facility], Dr Prudencio Burly, thought due to heavy steriod use after 1978  . Spine fracture    hx, multiple  . Varicose veins    Social History   Socioeconomic History  . Marital status: Divorced    Spouse name: Not on file  . Number of children: Not on file  . Years of education: Not on file  . Highest education level: Not on file  Occupational History  . Not on file  Social Needs  . Financial resource strain: Not hard at all  . Food insecurity:    Worry: Never true    Inability: Never true  . Transportation needs:    Medical: No    Non-medical: No  Tobacco Use  . Smoking status: Former Smoker    Packs/day: 1.50    Years: 30.00    Pack years: 45.00    Types: Cigarettes    Last attempt to quit: 11/06/1988    Years since quitting: 29.5  . Smokeless tobacco: Never Used  Substance and Sexual Activity  . Alcohol use: Not Currently    Comment: None in 30 years but drank beer before  . Drug use: No  . Sexual activity: Not Currently  Lifestyle  . Physical activity:    Days per week: 0 days    Minutes per session: 0 min  . Stress: Rather much  Relationships  . Social connections:    Talks on phone: Never    Gets together: More than three times a week    Attends religious service: Never    Active member of club or organization: No    Attends meetings of clubs or organizations: Never     Relationship status: Divorced  Other Topics Concern  . Not on file  Social History Narrative   Social History      Diet? I eat about what I want.       Do you drink/eat things with caffeine? yes      Marital status?                  divorced                  What year were you married? 1966 to 2007      Do you live in a house, apartment, assisted living, condo, trailer, etc.? [place]      Is it one or more stories? one      How many persons live in your home? Me only      Do you have any pets  in your home? (please list) no      Highest level of education completed? Finished high school      Current or past profession: Clinical biochemist in [place] you exercise?               no                       Type & how often?      Advanced Directives      Do you have a living will? no      Do you have a DNR form?     no                             If not, do you want to discuss one?      Do you have signed POA/HPOA for forms? No      Functional Status      Do you have difficulty bathing or dressing yourself? No- shower      Do you have difficulty preparing food or eating? No- Don't want to      Do you have difficulty managing your medications?      Do you have difficulty managing your finances?      Do you have difficulty affording your medications? No- paid for by [employer]   Family History  Problem Relation Age of Onset  . Lung cancer Father 47  . Diabetes Mother   . Osteoporosis Mother   . Throat cancer Unknown        family hx - "bad living" - also lung CA, heart disease and renal failure    . Diabetes Sister   . Suicidality Son 49  . Lung cancer Maternal Grandmother   . Diabetes Maternal Grandfather   . Heart attack Paternal Grandmother    Past Surgical History:  Procedure Laterality Date  . CARDIAC CATHETERIZATION N/A 09/19/2015   Procedure: Left Heart Cath and Coronary Angiography;  Surgeon: Adrian Prows, MD;  Location: [facility];   Service: Cardiovascular;  Laterality: N/A;  . CARPAL TUNNEL RELEASE    . CORONARY ARTERY BYPASS GRAFT N/A 09/22/2015   Procedure: CORONARY ARTERY BYPASS GRAFTING (CABG) times four using the right greater saphenous vein harvested endoscopically and the left internal mammary artery.  LIMA-LAD, SEQ SVG-DIAG & OM, SVG-PD.;  Surgeon: [name], MD;  Location: [facility];  Service: Open Heart Surgery;  Laterality: N/A;  . EYE SURGERY    . I&D EXTREMITY Right 04/07/2018   Procedure: IRRIGATION AND DEBRIDEMENT ABSCESS RIGHT LEG;  Surgeon: Newt Minion, MD;  Location: [facility];  Service: Orthopedics;  Laterality: Right;  . L foot open repair jones fracture  2010    5th metetarsal   . left ankle ganglion cyst  1976  . left cataract  1996   right - 1997  . left CTS  2006   R CTS - 2007  . left foot surgery  1998   R surgery/fracture - 1999  . left wrist/hand fusion  2008  . [place], 2004  . NASAL SINUS SURGERY     multiple- x8. last 1997 with obliteration  . REPAIR THORACIC AORTA  2017  . right hand fracture  1969  . ROTATOR CUFF REPAIR  2006   R, than repeat 2011, Dr Theda Sers  . TRIGGER FINGER RELEASE Left 11/11/2014  Procedure: LEFT LONG FINGER RELEASE TRIGGER FINGER/A-1 PULLEY;  Surgeon: Milly Jakob, MD;  Location: [facility];  Service: Orthopedics;  Laterality: Left;  . vocal surgery  2712      Vanessa Kick, MD 05/18/18 1243

## 2018-05-19 ENCOUNTER — Other Ambulatory Visit: Payer: Self-pay | Admitting: Internal Medicine

## 2018-05-19 ENCOUNTER — Other Ambulatory Visit: Payer: Self-pay | Admitting: *Deleted

## 2018-05-19 NOTE — Patient Outreach (Signed)
[facility]) Care Management  05/19/2018  [name] 1946/04/18 [id number]   Unsuccessful outreach call today. Will rescheduled last follow up call later this month prior to closing this case.   [name], RN Care Management Coordinator [place] Office [phone]

## 2018-05-22 DIAGNOSIS — E11621 Type 2 diabetes mellitus with foot ulcer: Secondary | ICD-10-CM | POA: Diagnosis not present

## 2018-05-22 DIAGNOSIS — L97812 Non-pressure chronic ulcer of other part of right lower leg with fat layer exposed: Secondary | ICD-10-CM | POA: Diagnosis not present

## 2018-05-22 DIAGNOSIS — L97428 Non-pressure chronic ulcer of left heel and midfoot with other specified severity: Secondary | ICD-10-CM | POA: Diagnosis not present

## 2018-05-22 DIAGNOSIS — L02415 Cutaneous abscess of right lower limb: Secondary | ICD-10-CM | POA: Diagnosis not present

## 2018-05-22 DIAGNOSIS — L97411 Non-pressure chronic ulcer of right heel and midfoot limited to breakdown of skin: Secondary | ICD-10-CM | POA: Diagnosis not present

## 2018-05-22 DIAGNOSIS — L03115 Cellulitis of right lower limb: Secondary | ICD-10-CM | POA: Diagnosis not present

## 2018-05-29 ENCOUNTER — Telehealth (INDEPENDENT_AMBULATORY_CARE_PROVIDER_SITE_OTHER): Payer: Self-pay | Admitting: Orthopedic Surgery

## 2018-05-29 DIAGNOSIS — F4310 Post-traumatic stress disorder, unspecified: Secondary | ICD-10-CM | POA: Insufficient documentation

## 2018-05-29 NOTE — Telephone Encounter (Signed)
[place] funds  [phone] Ext [phone]    Arbie Cookey called from [place] would like the office to give patient a call. Mr.Tarig was referred for HHT and he threw away his paperwork. Please call to verify [place] info.

## 2018-05-30 ENCOUNTER — Telehealth (INDEPENDENT_AMBULATORY_CARE_PROVIDER_SITE_OTHER): Payer: Self-pay

## 2018-05-30 DIAGNOSIS — L02415 Cutaneous abscess of right lower limb: Secondary | ICD-10-CM | POA: Diagnosis not present

## 2018-05-30 DIAGNOSIS — L03115 Cellulitis of right lower limb: Secondary | ICD-10-CM | POA: Diagnosis not present

## 2018-05-30 DIAGNOSIS — L97411 Non-pressure chronic ulcer of right heel and midfoot limited to breakdown of skin: Secondary | ICD-10-CM | POA: Diagnosis not present

## 2018-05-30 DIAGNOSIS — L97812 Non-pressure chronic ulcer of other part of right lower leg with fat layer exposed: Secondary | ICD-10-CM | POA: Diagnosis not present

## 2018-05-30 DIAGNOSIS — E11621 Type 2 diabetes mellitus with foot ulcer: Secondary | ICD-10-CM | POA: Diagnosis not present

## 2018-05-30 DIAGNOSIS — L97428 Non-pressure chronic ulcer of left heel and midfoot with other specified severity: Secondary | ICD-10-CM | POA: Diagnosis not present

## 2018-05-30 NOTE — Telephone Encounter (Signed)
Have tried to reach pt multiple times to set up [facility] and pt has not answered the phone. Has called and let the phone ring with no pick up they have been unsuccessful trying to set up home visits and pt is very upset. HHN Burman Nieves [phone] said that she spoke with this pt and his insurance carrier RN carol to advise why he has not been seen. Several missed visits does not answer the door. Today tried to reach the pt to make appt for tomorrow and it rand "21 times" no answer the pt saw missed call and called Maggie back and nurse set up for service tomorrow. Pt has follow up on 06/08/18 in our office.

## 2018-05-30 NOTE — Telephone Encounter (Signed)
I called and lm on vm for pt to check in and see how he is feeling. To confirm that he is using his PRAFO boot while at rest. Pt has an appt next Thursday can move that appt up if needed. Here to answer any questions. I tried to call Arbie Cookey no answer and she was calling from a benefits and retirement coordination office. Will sign off on message and await return call from pt.

## 2018-06-01 ENCOUNTER — Ambulatory Visit (INDEPENDENT_AMBULATORY_CARE_PROVIDER_SITE_OTHER): Payer: Medicare Other | Admitting: Orthopedic Surgery

## 2018-06-01 ENCOUNTER — Ambulatory Visit (INDEPENDENT_AMBULATORY_CARE_PROVIDER_SITE_OTHER): Payer: PRIVATE HEALTH INSURANCE | Admitting: Physician Assistant

## 2018-06-01 ENCOUNTER — Encounter (INDEPENDENT_AMBULATORY_CARE_PROVIDER_SITE_OTHER): Payer: Self-pay | Admitting: Orthopedic Surgery

## 2018-06-01 VITALS — Ht 72.0 in | Wt 209.0 lb

## 2018-06-01 DIAGNOSIS — E440 Moderate protein-calorie malnutrition: Secondary | ICD-10-CM

## 2018-06-01 DIAGNOSIS — L97421 Non-pressure chronic ulcer of left heel and midfoot limited to breakdown of skin: Secondary | ICD-10-CM | POA: Diagnosis not present

## 2018-06-01 DIAGNOSIS — E1142 Type 2 diabetes mellitus with diabetic polyneuropathy: Secondary | ICD-10-CM

## 2018-06-01 DIAGNOSIS — E08621 Diabetes mellitus due to underlying condition with foot ulcer: Secondary | ICD-10-CM | POA: Diagnosis not present

## 2018-06-01 DIAGNOSIS — I87311 Chronic venous hypertension (idiopathic) with ulcer of right lower extremity: Secondary | ICD-10-CM

## 2018-06-01 DIAGNOSIS — I6521 Occlusion and stenosis of right carotid artery: Secondary | ICD-10-CM

## 2018-06-01 DIAGNOSIS — L97919 Non-pressure chronic ulcer of unspecified part of right lower leg with unspecified severity: Secondary | ICD-10-CM

## 2018-06-02 DIAGNOSIS — L02415 Cutaneous abscess of right lower limb: Secondary | ICD-10-CM | POA: Diagnosis not present

## 2018-06-02 DIAGNOSIS — E11621 Type 2 diabetes mellitus with foot ulcer: Secondary | ICD-10-CM | POA: Diagnosis not present

## 2018-06-02 DIAGNOSIS — L03115 Cellulitis of right lower limb: Secondary | ICD-10-CM | POA: Diagnosis not present

## 2018-06-02 DIAGNOSIS — L97411 Non-pressure chronic ulcer of right heel and midfoot limited to breakdown of skin: Secondary | ICD-10-CM | POA: Diagnosis not present

## 2018-06-02 DIAGNOSIS — L97812 Non-pressure chronic ulcer of other part of right lower leg with fat layer exposed: Secondary | ICD-10-CM | POA: Diagnosis not present

## 2018-06-02 DIAGNOSIS — L97428 Non-pressure chronic ulcer of left heel and midfoot with other specified severity: Secondary | ICD-10-CM | POA: Diagnosis not present

## 2018-06-04 ENCOUNTER — Encounter (INDEPENDENT_AMBULATORY_CARE_PROVIDER_SITE_OTHER): Payer: Self-pay | Admitting: Physician Assistant

## 2018-06-04 NOTE — Progress Notes (Signed)
Office Visit Note   Patient: [name]           Date of Birth: 1946-05-27           MRN: [id number] Visit Date: 06/01/2018              Requested by: [name], [address] [place], [place] [address] PCP: [name], DO  Chief Complaint  Patient presents with  . Right Foot - Follow-up, Wound Check      HPI: Patient is a 72 year old male who was seen for follow-up of his left heel insensate diabetic ulcer area.  We had ordered home health for the patient to assist with wound care however home health did have some difficulty getting up with the patient but did come out more recently.  He has been weightbearing as tolerated and wearing his regular shoe and walking with crutches sometimes.  He reports the left heel ulcer is very painful and he is draining through his socks.  He is on multiple medications for chronic pain.  Assessment & Plan: Visit Diagnoses:  1. 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 Protein-calorie malnutrition, moderate ([place])   3. Diabetic polyneuropathy associated with type 2 diabetes mellitus ([place])   4. 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  Today we applied a silver alginate dressing to the left heel ulcer and then applied a felt donut with the felt to the skin and then a Dynaflex compression dressing to help control his edema.  He will follow-up here in 1 week or sooner should he have difficulties in the interim.  Follow-Up Instructions: Return in about 1 week (around 06/08/2018).   Ortho Exam  Patient is alert, oriented, no adenopathy, well-dressed, normal affect, normal respiratory effort. The patient has a 2 x 2 cm ulceration over the lateral aspect of his left heel which has yellow slough presents.  There is  moderate drainage.  There is no cellulitis.  There is edema.  Imaging: No results found. No images are attached to the encounter.  Labs: Lab Results  Component Value Date   HGBA1C 6.6 (H) 03/21/2018   HGBA1C 6.6 12/01/2017   HGBA1C 6.5 07/28/2017   ESRSEDRATE 30 (H) 12/11/2016   ESRSEDRATE 12 10/17/2009   CRP <0.8 12/11/2016   REPTSTATUS 12/16/2016 FINAL 12/12/2016   GRAMSTAIN NO WBC SEEN NO ORGANISMS SEEN  12/12/2016   CULT  12/12/2016    RARE STAPHYLOCOCCUS AUREUS WITHIN NORMAL SKIN FLORA    LABORGA STAPHYLOCOCCUS AUREUS 12/12/2016     Lab Results  Component Value Date   ALBUMIN 2.6 (L) 04/02/2018   ALBUMIN 3.1 (L) 04/01/2018   ALBUMIN 3.5 12/11/2016    Body mass index is 28.35 kg/m.  Orders:  No orders of the defined types were placed in this encounter.  No orders of the defined types were placed in this encounter.    Procedures: No procedures performed  Clinical Data: No additional findings.  ROS:  All other systems negative, except as noted in the HPI. Review of Systems  Objective: Vital Signs: Ht 6' (1.829 m)   Wt 209 lb (94.8 kg)   BMI 28.35 kg/m   Specialty Comments:  No specialty comments available.  PMFS History: Patient Active Problem List  Diagnosis Date Noted  . PTSD (post-traumatic stress disorder) 05/29/2018  . Abscess of right lower leg   . Severe protein-calorie malnutrition ([place])   . 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 Diabetic polyneuropathy associated with type 2 diabetes mellitus ([place])   . Cellulitis 04/01/2018  . Cellulitis of right lower extremity 04/01/2018  . Tachycardia 04/01/2018  . 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 Unstable angina ([place]) 09/20/2015  . Chest pain, rule out acute myocardial infarction 09/18/2015  . Diabetes mellitus type 2 with peripheral artery disease ([place]) 09/18/2015  . Hyponatremia 09/18/2015  . Chest pain on exertion 09/18/2015  . Varicose veins of  bilateral lower extremities with other complications 35/57/3220  . Occlusion and stenosis of carotid artery without mention of cerebral infarction 10/18/2011  . SINUSITIS, ACUTE 03/13/2010  . BACK PAIN 11/13/2009  . FATIGUE 10/17/2009  . Diabetic neuropathy associated with diabetes mellitus due to underlying condition ([place]) 08/29/2009  . Vitamin D deficiency 08/29/2009  . Hyposmolality and/or hyponatremia 08/29/2009  . VISUAL ACUITY, DECREASED, LEFT EYE 08/29/2009  . PERIPHERAL VASCULAR DISEASE 08/29/2009  . SINUSITIS, CHRONIC 08/29/2009  . NEPHROLITHIASIS 08/29/2009  . FIBROMYALGIA 08/29/2009  . BICEPS TENDON RUPTURE, RIGHT 08/11/2009  . DIABETES MELLITUS, TYPE II 08/05/2009  . Hyperlipidemia 08/05/2009  . ANEMIA-NOS 08/05/2009  . ANXIETY 08/05/2009  . DEPRESSION 08/05/2009  . Chronic pain syndrome 08/05/2009  . Essential hypertension 08/05/2009  . GERD 08/05/2009  . BENIGN PROSTATIC HYPERTROPHY 08/05/2009  . [place] DISEASE, LUMBAR 08/05/2009  . Osteoporosis 08/05/2009   Past Medical History:  Diagnosis Date  . Anemia    NOS  . Anxiety   . 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 BPH (benign prostatic hypertrophy)   . Broken foot Oct. 12, 2013   Right foot Fx  . CAD (coronary artery disease)   . Cellulitis and abscess of leg 03/2018   right leg  . Chronic hyponatremia   . Chronic pain syndrome   . COPD (chronic obstructive pulmonary disease) ([place])   . Depression   . Diabetes mellitus   . Diabetic foot ulcer ([place])   . DJD (degenerative joint disease)    L wrist  . DM2 (diabetes mellitus, type 2) ([place])   . GERD (gastroesophageal reflux disease)   . HLD (hyperlipidemia)   . HTN (hypertension)   . Lumbar disc disease   . Osteoporosis    [facility], Dr Prudencio Burly, thought due to heavy steriod use after 1978  . Spine fracture    hx, multiple  . Varicose veins     Family History  Problem Relation Age of Onset  . Lung cancer Father 30  . Diabetes  Mother   . Osteoporosis Mother   . Throat cancer Unknown        family hx - "bad living" - also lung CA, heart disease and renal failure    . Diabetes Sister   . Suicidality Son 51  . Lung cancer Maternal Grandmother   . Diabetes Maternal Grandfather   . Heart attack Paternal Grandmother     Past Surgical History:  Procedure Laterality Date  . CARDIAC CATHETERIZATION N/A 09/19/2015   Procedure: Left Heart Cath and Coronary Angiography;  Surgeon: Adrian Prows, MD;  Location: [facility];  Service: Cardiovascular;  Laterality: N/A;  . CARPAL TUNNEL RELEASE    . CORONARY ARTERY BYPASS GRAFT N/A 09/22/2015   Procedure: CORONARY ARTERY BYPASS  GRAFTING (CABG) times four using the right greater saphenous vein harvested endoscopically and the left internal mammary artery.  LIMA-LAD, SEQ SVG-DIAG & OM, SVG-PD.;  Surgeon: [name], MD;  Location: [facility];  Service: Open Heart Surgery;  Laterality: N/A;  . EYE SURGERY    . I&D EXTREMITY Right 04/07/2018   Procedure: IRRIGATION AND DEBRIDEMENT ABSCESS RIGHT LEG;  Surgeon: Newt Minion, MD;  Location: [facility];  Service: Orthopedics;  Laterality: Right;  . L foot open repair jones fracture  2010    5th metetarsal   . left ankle ganglion cyst  1976  . left cataract  1996   right - 1997  . left CTS  2006   R CTS - 2007  . left foot surgery  1998   R surgery/fracture - 1999  . left wrist/hand fusion  2008  . [place], 2004  . NASAL SINUS SURGERY     multiple- x8. last 1997 with obliteration  . REPAIR THORACIC AORTA  2017  . right hand fracture  1969  . ROTATOR CUFF REPAIR  2006   R, than repeat 2011, Dr Theda Sers  . TRIGGER FINGER RELEASE Left 11/11/2014   Procedure: LEFT LONG FINGER RELEASE TRIGGER FINGER/A-1 PULLEY;  Surgeon: Milly Jakob, MD;  Location: [facility];  Service: Orthopedics;  Laterality: Left;  . vocal surgery  1996   Social History   Occupational History  . Not on file  Tobacco Use  .  Smoking status: Former Smoker    Packs/day: 1.50    Years: 30.00    Pack years: 45.00    Types: Cigarettes    Last attempt to quit: 11/06/1988    Years since quitting: 29.5  . Smokeless tobacco: Never Used  Substance and Sexual Activity  . Alcohol use: Not Currently    Comment: None in 30 years but drank beer before  . Drug use: No  . Sexual activity: Not Currently

## 2018-06-05 ENCOUNTER — Encounter (HOSPITAL_BASED_OUTPATIENT_CLINIC_OR_DEPARTMENT_OTHER): Payer: Medicare Other | Attending: Internal Medicine

## 2018-06-06 ENCOUNTER — Ambulatory Visit
Admission: RE | Admit: 2018-06-06 | Discharge: 2018-06-06 | Disposition: A | Discharge status: Home / Self Care | Payer: Medicare (Managed Care) | Source: Ambulatory Visit | Attending: Internal Medicine | Admitting: Internal Medicine

## 2018-06-06 DIAGNOSIS — L03115 Cellulitis of right lower limb: Secondary | ICD-10-CM | POA: Diagnosis not present

## 2018-06-06 DIAGNOSIS — L97411 Non-pressure chronic ulcer of right heel and midfoot limited to breakdown of skin: Secondary | ICD-10-CM | POA: Diagnosis not present

## 2018-06-06 DIAGNOSIS — L02415 Cutaneous abscess of right lower limb: Secondary | ICD-10-CM | POA: Diagnosis not present

## 2018-06-06 DIAGNOSIS — M8000XS Age-related osteoporosis with current pathological fracture, unspecified site, sequela: Secondary | ICD-10-CM

## 2018-06-06 DIAGNOSIS — E11621 Type 2 diabetes mellitus with foot ulcer: Secondary | ICD-10-CM | POA: Diagnosis not present

## 2018-06-06 DIAGNOSIS — L97812 Non-pressure chronic ulcer of other part of right lower leg with fat layer exposed: Secondary | ICD-10-CM | POA: Diagnosis not present

## 2018-06-06 DIAGNOSIS — L97428 Non-pressure chronic ulcer of left heel and midfoot with other specified severity: Secondary | ICD-10-CM | POA: Diagnosis not present

## 2018-06-07 ENCOUNTER — Telehealth: Payer: Self-pay | Admitting: Internal Medicine

## 2018-06-07 NOTE — Telephone Encounter (Signed)
I spoke with the patient about community resource referral for [employer] at the [employer] and a resource to help filling out living will and asset living will.  He said that he is not interested in exercise classes.  For the living will, he stated that he has the paperwork.  So I gave him information for the Senior Legal Help Line (Legal Aid)- Toll-Free: 1 [phone]  9:00 AM - 11:00 AM, Monday - Friday 1:00 PM - 3:00 PM, Monday - Friday

## 2018-06-08 ENCOUNTER — Ambulatory Visit (INDEPENDENT_AMBULATORY_CARE_PROVIDER_SITE_OTHER): Payer: PRIVATE HEALTH INSURANCE

## 2018-06-08 ENCOUNTER — Ambulatory Visit: Payer: Self-pay | Admitting: Physician Assistant

## 2018-06-08 ENCOUNTER — Other Ambulatory Visit: Payer: Self-pay

## 2018-06-08 ENCOUNTER — Ambulatory Visit (INDEPENDENT_AMBULATORY_CARE_PROVIDER_SITE_OTHER): Payer: Medicare Other | Admitting: Physician Assistant

## 2018-06-08 ENCOUNTER — Ambulatory Visit (INDEPENDENT_AMBULATORY_CARE_PROVIDER_SITE_OTHER): Payer: Medicare Other | Admitting: Orthopedic Surgery

## 2018-06-08 ENCOUNTER — Encounter (INDEPENDENT_AMBULATORY_CARE_PROVIDER_SITE_OTHER): Payer: Self-pay | Admitting: Orthopedic Surgery

## 2018-06-08 DIAGNOSIS — E1151 Type 2 diabetes mellitus with diabetic peripheral angiopathy without gangrene: Secondary | ICD-10-CM

## 2018-06-08 DIAGNOSIS — E08621 Diabetes mellitus due to underlying condition with foot ulcer: Secondary | ICD-10-CM | POA: Diagnosis not present

## 2018-06-08 DIAGNOSIS — E440 Moderate protein-calorie malnutrition: Secondary | ICD-10-CM

## 2018-06-08 DIAGNOSIS — I70211 Atherosclerosis of native arteries of extremities with intermittent claudication, right leg: Secondary | ICD-10-CM

## 2018-06-08 DIAGNOSIS — I6523 Occlusion and stenosis of bilateral carotid arteries: Secondary | ICD-10-CM

## 2018-06-08 DIAGNOSIS — L97421 Non-pressure chronic ulcer of left heel and midfoot limited to breakdown of skin: Secondary | ICD-10-CM

## 2018-06-08 MED ORDER — DOXYCYCLINE HYCLATE 100 MG PO CAPS: 100.0000 mg | ORAL_CAPSULE | Freq: Two times a day (BID) | ORAL | 1 refills | Status: DC

## 2018-06-09 ENCOUNTER — Encounter (INDEPENDENT_AMBULATORY_CARE_PROVIDER_SITE_OTHER): Payer: Self-pay | Admitting: Physician Assistant

## 2018-06-09 ENCOUNTER — Other Ambulatory Visit: Payer: Self-pay | Admitting: *Deleted

## 2018-06-09 NOTE — Patient Outreach (Signed)
[facility]) Care Management  06/09/2018  Yesenia Fontenette [place] [birth year] [id number]    Spoke with pt concerning his ongoing management of care. Pt reports recent visit to his provider's office on yesterday and there was a problem with finding his pulse. States he will undergo an ultrasound via Dr. Jess Barters recommendations. Pt also indicated his LE wounds were check and he has been ordered for an antibiotic. RN inquired on his diabetes as pt states his reading today was 120 after he ate. RN stress the importance of daily CBG during this time due to his ongoing wounds and medications treatment regimen. Pt understanding the risk involved if unmonitored. Will review the current plan of care and adjust the interventions accordingly based upon pt's adherence.   Pt states he may need assistance with transportation due to vehicle not working. RN offered a referral for BSW to assist with transportation needs (pt receptive). Will make a referral and offer to follow up in few weeks concerning pt's plan of care. Pt receptive and will continue to manage his care with the above discussed and education received today. No other request or inquires at this time.   [name]     Most Recent Value  Care Plan Problem One  Hospital prevention related to cellulitis  Role Documenting the Problem One  Care Management [place] for Problem One  Active  THN Long Term Goal   Pt will not have a readmission related to his wounds over the next 60 days.  THN Long Term Goal Start Date  04/12/18 [Will extend to allow adherence with wound care]  Interventions for Problem One Long Term Goal  Will continue to encouraged management of care related to keeping the site C/D/I to prevent infection.  THN CM Short Term Goal #1   Pt will take all of the prescribed antibotic over the next 30 days  THN CM Short Term Goal #1 Start Date  06/09/18  Interventions for Short Term Goal #1  Will stress  the importance of pt to take his medications as prescribed to prevent risk of  further infection.  THN CM Short Term Goal #2   Medical appointments adherence post-op discharge over the next 30 days.  THN CM Short Term Goal #2 Start Date  04/05/18  Interventions for Short Term Goal #2  Will extend pending ongoing appointments related tohis ongong issues.     [name], RN Care Management Coordinator [place] Office [phone]

## 2018-06-09 NOTE — Progress Notes (Signed)
Office Visit Note   Patient: [name]           Date of Birth: 1945/09/28           MRN: [id number] Visit Date: 06/08/2018              Requested by: [name], [address] [place], [place] [address] PCP: [name], DO  Chief Complaint  Patient presents with  . Left Foot - Follow-up      HPI: The patient is a 72 yo male who is seen for follow up of his left heel ulcer and right leg ulcer. He had undergone right leg irrigation and debridement 04/07/18. He reports a new area developing over the mid foot on the left. He reports moderate pain over the area.   Assessment & Plan: Visit Diagnoses:  1. 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 Diabetes mellitus type 2 with peripheral artery disease ([place])   3. Moderate protein malnutrition ([place])     Plan: Urgent referral to vein and vascular as pedal pulses more diminished this visit. Patient reports was supposed to follow up on his bypass earlier this year, but missed appointment due to being in the hospital.  Will also start Doxycycline 100mg  BID for his ulcers. Stressed the importance of continuing to stay off of his feet when possible and elevate when possible. Follow up in 2 weeks  Follow-Up Instructions: Return in about 2 weeks (around 06/22/2018).   Ortho Exam  Patient is alert, oriented, no adenopathy, well-dressed, normal affect, normal respiratory effort. Left heel ulcer is 2 x 3 x 0.3 cm with pale pink tissue, no granulation, moderate serous drainage. Right foot slow healing of right stasis ulceration. Edema right lower extremity> left lower extremity.  The pulses over the left dorsalis pedis and posterior tibialis were not palpable today and by Doppler were monophasic.  Imaging: No results found. No images are attached to the encounter.  Labs: Lab Results  Component Value Date   HGBA1C 6.6 (H) 03/21/2018   HGBA1C 6.6 12/01/2017   HGBA1C 6.5 07/28/2017   ESRSEDRATE 30 (H) 12/11/2016   ESRSEDRATE 12 10/17/2009   CRP <0.8 12/11/2016   REPTSTATUS 12/16/2016 FINAL 12/12/2016   GRAMSTAIN NO WBC SEEN NO ORGANISMS SEEN  12/12/2016   CULT  12/12/2016    RARE STAPHYLOCOCCUS AUREUS WITHIN NORMAL SKIN FLORA    LABORGA STAPHYLOCOCCUS AUREUS 12/12/2016     Lab Results  Component Value Date   ALBUMIN 2.6 (L) 04/02/2018   ALBUMIN 3.1 (L) 04/01/2018   ALBUMIN 3.5 12/11/2016    There is no height or weight on file to calculate BMI.  Orders:  Orders Placed This Encounter  Procedures  . XR Foot 2 Views Left  . Ambulatory referral to Vascular Surgery   Meds ordered this encounter  Medications  . doxycycline (VIBRAMYCIN) 100 MG capsule    Sig: Take 1 capsule (100 mg total) by mouth 2 (two) times daily.    Dispense:  28 capsule    Refill:  1     Procedures: No procedures performed  Clinical Data: No additional findings.  ROS:  All other systems negative, except as noted in the HPI. Review of Systems  Objective: Vital Signs: There were no vitals taken for this visit.  Specialty Comments:  No specialty comments available.  PMFS History: Patient Active Problem List   Diagnosis Date Noted  .  PTSD (post-traumatic stress disorder) 05/29/2018  . Abscess of right lower leg   . Severe protein-calorie malnutrition ([place])   . 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 Diabetic polyneuropathy associated with type 2 diabetes mellitus ([place])   . Cellulitis 04/01/2018  . Cellulitis of right lower extremity 04/01/2018  . Tachycardia 04/01/2018  . 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 Unstable angina ([place]) 09/20/2015  . Chest pain, rule out acute myocardial infarction 09/18/2015  . Diabetes mellitus type 2 with peripheral artery disease ([place]) 09/18/2015  . Hyponatremia 09/18/2015  . Chest pain on exertion 09/18/2015  . Varicose veins of bilateral lower  extremities with other complications 97/98/9211  . Occlusion and stenosis of carotid artery without mention of cerebral infarction 10/18/2011  . SINUSITIS, ACUTE 03/13/2010  . BACK PAIN 11/13/2009  . FATIGUE 10/17/2009  . Diabetic neuropathy associated with diabetes mellitus due to underlying condition ([place]) 08/29/2009  . Vitamin D deficiency 08/29/2009  . Hyposmolality and/or hyponatremia 08/29/2009  . VISUAL ACUITY, DECREASED, LEFT EYE 08/29/2009  . PERIPHERAL VASCULAR DISEASE 08/29/2009  . SINUSITIS, CHRONIC 08/29/2009  . NEPHROLITHIASIS 08/29/2009  . FIBROMYALGIA 08/29/2009  . BICEPS TENDON RUPTURE, RIGHT 08/11/2009  . DIABETES MELLITUS, TYPE II 08/05/2009  . Hyperlipidemia 08/05/2009  . ANEMIA-NOS 08/05/2009  . ANXIETY 08/05/2009  . DEPRESSION 08/05/2009  . Chronic pain syndrome 08/05/2009  . Essential hypertension 08/05/2009  . GERD 08/05/2009  . BENIGN PROSTATIC HYPERTROPHY 08/05/2009  . [place] DISEASE, LUMBAR 08/05/2009  . Osteoporosis 08/05/2009   Past Medical History:  Diagnosis Date  . Anemia    NOS  . Anxiety   . 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 BPH (benign prostatic hypertrophy)   . Broken foot Oct. 12, 2013   Right foot Fx  . CAD (coronary artery disease)   . Cellulitis and abscess of leg 03/2018   right leg  . Chronic hyponatremia   . Chronic pain syndrome   . COPD (chronic obstructive pulmonary disease) ([place])   . Depression   . Diabetes mellitus   . Diabetic foot ulcer ([place])   . DJD (degenerative joint disease)    L wrist  . DM2 (diabetes mellitus, type 2) ([place])   . GERD (gastroesophageal reflux disease)   . HLD (hyperlipidemia)   . HTN (hypertension)   . Lumbar disc disease   . Osteoporosis    tx per [facility], Dr Prudencio Burly, thought due to heavy steriod use after 1978  . Spine fracture    hx, multiple  . Varicose veins     Family History  Problem Relation Age of Onset  . Lung cancer Father 89  . Diabetes Mother   .  Osteoporosis Mother   . Throat cancer Unknown        family hx - "bad living" - also lung CA, heart disease and renal failure    . Diabetes Sister   . Suicidality Son 74  . Lung cancer Maternal Grandmother   . Diabetes Maternal Grandfather   . Heart attack Paternal Grandmother     Past Surgical History:  Procedure Laterality Date  . CARDIAC CATHETERIZATION N/A 09/19/2015   Procedure: Left Heart Cath and Coronary Angiography;  Surgeon: Adrian Prows, MD;  Location: [facility];  Service: Cardiovascular;  Laterality: N/A;  . CARPAL TUNNEL RELEASE    . CORONARY ARTERY BYPASS GRAFT N/A 09/22/2015   Procedure: CORONARY ARTERY BYPASS GRAFTING (CABG) times four using  the right greater saphenous vein harvested endoscopically and the left internal mammary artery.  LIMA-LAD, SEQ SVG-DIAG & OM, SVG-PD.;  Surgeon: [name], MD;  Location: [facility];  Service: Open Heart Surgery;  Laterality: N/A;  . EYE SURGERY    . I&D EXTREMITY Right 04/07/2018   Procedure: IRRIGATION AND DEBRIDEMENT ABSCESS RIGHT LEG;  Surgeon: Newt Minion, MD;  Location: [facility];  Service: Orthopedics;  Laterality: Right;  . L foot open repair jones fracture  2010    5th metetarsal   . left ankle ganglion cyst  1976  . left cataract  1996   right - 1997  . left CTS  2006   R CTS - 2007  . left foot surgery  1998   R surgery/fracture - 1999  . left wrist/hand fusion  2008  . [place], 2004  . NASAL SINUS SURGERY     multiple- x8. last 1997 with obliteration  . REPAIR THORACIC AORTA  2017  . right hand fracture  1969  . ROTATOR CUFF REPAIR  2006   R, than repeat 2011, Dr Theda Sers  . TRIGGER FINGER RELEASE Left 11/11/2014   Procedure: LEFT LONG FINGER RELEASE TRIGGER FINGER/A-1 PULLEY;  Surgeon: Milly Jakob, MD;  Location: [facility];  Service: Orthopedics;  Laterality: Left;  . vocal surgery  1996   Social History   Occupational History  . Not on file  Tobacco Use  . Smoking  status: Former Smoker    Packs/day: 1.50    Years: 30.00    Pack years: 45.00    Types: Cigarettes    Last attempt to quit: 11/06/1988    Years since quitting: 29.6  . Smokeless tobacco: Never Used  Substance and Sexual Activity  . Alcohol use: Not Currently    Comment: None in 30 years but drank beer before  . Drug use: No  . Sexual activity: Not Currently

## 2018-06-14 ENCOUNTER — Other Ambulatory Visit: Payer: Self-pay

## 2018-06-14 DIAGNOSIS — L97812 Non-pressure chronic ulcer of other part of right lower leg with fat layer exposed: Secondary | ICD-10-CM | POA: Diagnosis not present

## 2018-06-14 DIAGNOSIS — L03115 Cellulitis of right lower limb: Secondary | ICD-10-CM | POA: Diagnosis not present

## 2018-06-14 DIAGNOSIS — E11621 Type 2 diabetes mellitus with foot ulcer: Secondary | ICD-10-CM | POA: Diagnosis not present

## 2018-06-14 DIAGNOSIS — L02415 Cutaneous abscess of right lower limb: Secondary | ICD-10-CM | POA: Diagnosis not present

## 2018-06-14 DIAGNOSIS — L97411 Non-pressure chronic ulcer of right heel and midfoot limited to breakdown of skin: Secondary | ICD-10-CM | POA: Diagnosis not present

## 2018-06-14 DIAGNOSIS — L97428 Non-pressure chronic ulcer of left heel and midfoot with other specified severity: Secondary | ICD-10-CM | POA: Diagnosis not present

## 2018-06-14 NOTE — Patient Outreach (Signed)
[facility]) Care Management  06/14/2018  Lunden Mcleish [facility] [birth year] [id number]  Successful outreach to the patient on today's date, HIPAA identifiers confirmed. BSW introduced self to the patient and the reason for today's call, indicating BSW received referral to assist the patient with a SCAT application. The patient reports his car has been in the shop for almost 1 month and it is becoming difficult to afford a taxi company. The patient reports he uses a walker or cane on most days. The patient is interested in SCAT and feels "it is a good option".  BSW educated the patient on eligibility requirements for SCAT and the application process. BSW also informed the patient that if he is actively driving he will most likely not qualify. The patient stated understanding. 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 BSW completed part A of the application telephonically with the patients assistance. BSW faxed part B to the patients provider Dr. Eulas Post at [facility] for completion. BSW to mail the patient information on SCAT for review.   Plan: BSW to outreach the patient in the next two weeks to confirm receipt of resources. BSW will continue to follow and assist with application submission.  Daneen Schick, BSW, CDP [facility] [phone]

## 2018-06-15 DIAGNOSIS — L97812 Non-pressure chronic ulcer of other part of right lower leg with fat layer exposed: Secondary | ICD-10-CM | POA: Diagnosis not present

## 2018-06-15 DIAGNOSIS — L03115 Cellulitis of right lower limb: Secondary | ICD-10-CM | POA: Diagnosis not present

## 2018-06-15 DIAGNOSIS — L97411 Non-pressure chronic ulcer of right heel and midfoot limited to breakdown of skin: Secondary | ICD-10-CM | POA: Diagnosis not present

## 2018-06-15 DIAGNOSIS — L97428 Non-pressure chronic ulcer of left heel and midfoot with other specified severity: Secondary | ICD-10-CM | POA: Diagnosis not present

## 2018-06-15 DIAGNOSIS — E11621 Type 2 diabetes mellitus with foot ulcer: Secondary | ICD-10-CM | POA: Diagnosis not present

## 2018-06-15 DIAGNOSIS — L02415 Cutaneous abscess of right lower limb: Secondary | ICD-10-CM | POA: Diagnosis not present

## 2018-06-16 ENCOUNTER — Other Ambulatory Visit: Payer: Self-pay

## 2018-06-16 NOTE — Patient Outreach (Signed)
[facility]) Care Management  06/16/2018  Jaquez Farrington [facility] [birth year] [id number]  BSW received voice message from [facility] with [facility] indicating the patients primary physician has left the practice and the patients next appointment with his new provider would be November 13. Coralyn Mark indicated the SCAT verification would be held until the patients appointment for the provider to complete.  Daneen Schick, BSW, CDP [facility] [phone]

## 2018-06-20 ENCOUNTER — Other Ambulatory Visit: Payer: Self-pay | Admitting: Physician Assistant

## 2018-06-20 MED ORDER — ROPINIROLE HCL 1 MG PO TABS: ORAL_TABLET | ORAL | 2 refills | Status: DC

## 2018-06-21 ENCOUNTER — Other Ambulatory Visit: Payer: Self-pay | Admitting: *Deleted

## 2018-06-21 ENCOUNTER — Other Ambulatory Visit: Payer: Self-pay

## 2018-06-21 ENCOUNTER — Ambulatory Visit (INDEPENDENT_AMBULATORY_CARE_PROVIDER_SITE_OTHER)
Admission: RE | Admit: 2018-06-21 | Discharge: 2018-06-21 | Disposition: A | Discharge status: Home / Self Care | Payer: Medicare Other | Source: Ambulatory Visit | Attending: Family | Admitting: Family

## 2018-06-21 ENCOUNTER — Ambulatory Visit (HOSPITAL_COMMUNITY)
Admission: RE | Admit: 2018-06-21 | Discharge: 2018-06-21 | Disposition: A | Discharge status: Home / Self Care | Payer: Medicare Other | Source: Ambulatory Visit | Attending: Family | Admitting: Family

## 2018-06-21 DIAGNOSIS — I6523 Occlusion and stenosis of bilateral carotid arteries: Secondary | ICD-10-CM

## 2018-06-21 DIAGNOSIS — I70211 Atherosclerosis of native arteries of extremities with intermittent claudication, right leg: Secondary | ICD-10-CM | POA: Diagnosis not present

## 2018-06-21 MED ORDER — TAMSULOSIN HCL 0.4 MG PO CAPS: 0.4000 mg | ORAL_CAPSULE | Freq: Every day | ORAL | 0 refills | Status: DC

## 2018-06-21 NOTE — Telephone Encounter (Signed)
[facility]. Pended and sent to [facility] for approval.

## 2018-06-21 NOTE — Patient Outreach (Signed)
[facility]) Care Management  06/21/2018  Nil Xiong [facility] [birth year] [id number]  BSW received a voice message from the patient with questions surrounding mailed community resources. BSW placed a return call to the patient. Unfortunately, the patient did not answer and no voice mailbox was established. The phone rang several times then disconnected.  Plan: BSW to follow up with the patient in the next week as previously planned.  Daneen Schick, BSW, CDP [facility] [phone]

## 2018-06-22 ENCOUNTER — Telehealth (INDEPENDENT_AMBULATORY_CARE_PROVIDER_SITE_OTHER): Payer: Self-pay | Admitting: Orthopedic Surgery

## 2018-06-22 NOTE — Telephone Encounter (Signed)
Debbie-nurse from [facility] called advised cert will end tomorrow. Debbie said she need orders faxed to her to continue wound care. The fax# is [phone]   The number to contact Jackelyn Poling is [phone]

## 2018-06-23 ENCOUNTER — Other Ambulatory Visit: Payer: Self-pay

## 2018-06-23 ENCOUNTER — Other Ambulatory Visit (INDEPENDENT_AMBULATORY_CARE_PROVIDER_SITE_OTHER): Payer: Self-pay

## 2018-06-23 ENCOUNTER — Encounter: Payer: Self-pay | Admitting: Family

## 2018-06-23 ENCOUNTER — Ambulatory Visit (INDEPENDENT_AMBULATORY_CARE_PROVIDER_SITE_OTHER): Payer: PRIVATE HEALTH INSURANCE | Admitting: Family

## 2018-06-23 ENCOUNTER — Other Ambulatory Visit: Payer: Self-pay | Admitting: *Deleted

## 2018-06-23 VITALS — BP 157/84 | HR 78 | Temp 97.0°F | Resp 18 | Ht 72.0 in | Wt 195.0 lb

## 2018-06-23 DIAGNOSIS — I6523 Occlusion and stenosis of bilateral carotid arteries: Secondary | ICD-10-CM | POA: Diagnosis not present

## 2018-06-23 DIAGNOSIS — I771 Stricture of artery: Secondary | ICD-10-CM

## 2018-06-23 DIAGNOSIS — Z9889 Other specified postprocedural states: Secondary | ICD-10-CM | POA: Diagnosis not present

## 2018-06-23 DIAGNOSIS — I779 Disorder of arteries and arterioles, unspecified: Secondary | ICD-10-CM | POA: Diagnosis not present

## 2018-06-23 LAB — HM DIABETES EYE EXAM

## 2018-06-23 MED ORDER — CELECOXIB 200 MG PO CAPS: 200.0000 mg | ORAL_CAPSULE | Freq: Every day | ORAL | 2 refills | Status: DC

## 2018-06-23 NOTE — Progress Notes (Signed)
[facility] [place] HISTORY AND PHYSICAL   CC: follow up peripheral artery occlusive disease and extracranial carotid artery stenosis    History of Present Illness:   [name] is a 72 y.o. male who is status post aortobifemoral bypass graft as well as left carotid endarterectomy in 2012 by Dr. Trula Slade. He began complaining of leg swelling as well as prominent varicosities and underwent laser ablation of the right small saphenous and left great saphenous vein 4 swelling and pain.  This had a significant improvement in his leg swelling.However, he is now having worsening swelling.  He is not wearing compression stockings.  He is s/p CABG in 2017. 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 The patient was felt to be at too great of risk to undergo concomitant innominate revascularization and CABG. He is asymptomatic from his innominate stenosis. He does not have any significant weakness in his right arm although he does state that it is sometimes more fatigue than the left. He has not had any TIA or stroke symptoms recently. Pt states the swelling in his right leg started after his CABG.   Dr. Trula Slade last evaluated pt on 05-09-17. At that time: Carotid stenosis:The patient had widely patent carotid arteries bilaterally with a widely patent left carotid endarterectomy site. He was to follow up in one year with a repeat ultrasound PVD, lower extremity:Aortobifem was widely patent.  ABIs decreased on the left, however he was asymptomatic.  Continue to observe this.  Repeat studies in 1 year Innominate stenosis:The patient had significant discrepancy between his right and left brachial pressure, however he was asymptomatic. Based on the characteristics of this lesion, Dr. Trula Slade did not think that pt is a good candidate for stenting.   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 Pt c/o tingling in both hands, right worse than left, denies dizziness.  He had left trigger finger repair early April 2016. His states his right foot/leg is weak, attributes to fractured vertebrae and back surgeries, his right foot gets hung up on carpets, etc.; he fell February 2016.   His podiatrist has been treating both heel ulcers since April 2019, states the skin on his feet peels, which he states started the ulcers.   Diabetic: Yes, 6.6 A1C on 03-21-18 Tobacco use: former smoker, quit in 1990  Pt meds include: Statin :No, causes myalgias ASA: No, bothers his stomach Other anticoagulants/antiplatelets: Plavix   Current Outpatient Medications  Medication Sig Dispense Refill  . ACCU-CHEK FASTCLIX LANCETS MISC Use as directed to check blood glucose. Dx: E11.51 102 each 6  . albuterol (PROAIR HFA) 108 (90 Base) MCG/ACT inhaler Inhale 2 puffs into the lungs every 6 (six) hours as needed for wheezing or shortness of breath.    . Blood Glucose Monitoring Suppl (ACCU-CHEK AVIVA PLUS) w/Device KIT Use as directed to check blood glucose daily. Dx: E11.51 1 kit 0  . buPROPion (WELLBUTRIN XL) 150 MG 24 hr tablet Take 150 mg by mouth every morning. Take 3 (450 mg) each morning    . celecoxib (CELEBREX) 200 MG capsule Take 1 capsule (200 mg total) by mouth daily. 30 capsule 2  . Cholecalciferol (VITAMIN D3) 5000 units TABS Take 5,000 Units by mouth daily.    . clonazePAM (KLONOPIN) 0.5 MG tablet Take 1 mg by  mouth 2 (two) times daily as needed.     . clopidogrel (PLAVIX) 75 MG tablet Take 75 mg by mouth daily.    . cyclobenzaprine (FLEXERIL) 10 MG tablet Take 10 mg by mouth 3 (three) times daily as needed for muscle spasms.   0  . desonide (DESOWEN) 0.05 % cream Apply 1 application topically daily as needed (rosacea).     . doxycycline (VIBRAMYCIN) 100 MG capsule Take 1 capsule (100 mg  total) by mouth 2 (two) times daily. 28 capsule 1  . fentaNYL (DURAGESIC - DOSED MCG/HR) 50 MCG/HR Place 50 mcg onto the skin every 3 (three) days.    . furosemide (LASIX) 20 MG tablet Take 20-40 mg by mouth See admin instructions. Take 2 tablets (40 mg) by mouth every morning and 1 tablet (20 mg) in the afternoon    . gabapentin (NEURONTIN) 400 MG capsule Take 800 mg by mouth daily. Take 1 (400 mg) each morning & 2 (800 mg) each evening    . glucose blood (ACCU-CHEK AVIVA PLUS) test strip 1 each by Other route as needed for other. Use as instructed. Dx: E11.51 100 each 6  . IRON PO Take 1 capsule by mouth daily.    Marland Kitchen JANUVIA 100 MG tablet TAKE 1 TABLET BY MOUTH EVERY DAY 90 tablet 1  . losartan (COZAAR) 25 MG tablet TAKE 1 TABLET BY MOUTH EVERY DAY 90 tablet 0  . mineral oil-hydrophilic petrolatum (AQUAPHOR) ointment Apply topically 2 (two) times daily. Apply to lower extremities twice daily for dry skin until resolved. 420 g 0  . Multiple Vitamin (MULTIVITAMIN WITH MINERALS) TABS tablet Take 1 tablet by mouth daily.    . nitroGLYCERIN (NITROSTAT) 0.4 MG SL tablet Place 0.4 mg under the tongue every 5 (five) minutes as needed for chest pain.    Marland Kitchen omeprazole (PRILOSEC) 40 MG capsule Take one capsule by mouth twice daily 60 capsule 4  . potassium chloride (K-DUR) 10 MEQ tablet Take 10 mEq by mouth 2 (two) times daily.    Marland Kitchen rOPINIRole (REQUIP) 1 MG tablet For restless legs 60 tablet 2  . tamsulosin (FLOMAX) 0.4 MG CAPS capsule Take 1 capsule (0.4 mg total) by mouth daily. 180 capsule 0  . zonisamide (ZONEGRAN) 50 MG capsule TAKE 3 CAPSULES(150 MG) BY MOUTH AT BEDTIME (Patient taking differently: Take 150 mg by mouth at bedtime. ) 270 capsule 1  . HYDROmorphone (DILAUDID) 2 MG tablet Take 2 mg by mouth every 6 (six) hours as needed for severe pain.      No current facility-administered medications for this visit.     Past Medical History:  Diagnosis Date  . Anemia    NOS  . Anxiety   .  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 BPH (benign prostatic hypertrophy)   . Broken foot Oct. 12, 2013   Right foot Fx  . CAD (coronary artery disease)   . Cellulitis and abscess of leg 03/2018   right leg  . Chronic hyponatremia   . Chronic pain syndrome   . COPD (chronic obstructive pulmonary disease) ([place])   . Depression   . Diabetes mellitus   . Diabetic foot ulcer ([place])   . DJD (degenerative joint disease)    L wrist  . DM2 (diabetes mellitus, type 2) ([place])   . GERD (gastroesophageal reflux disease)   . HLD (hyperlipidemia)   . HTN (hypertension)   . Lumbar disc disease   . Osteoporosis  tx per [facility], Dr Prudencio Burly, thought due to heavy steriod use after 1978  . Spine fracture    hx, multiple  . Varicose veins     Social History Social History   Tobacco Use  . Smoking status: Former Smoker    Packs/day: 1.50    Years: 30.00    Pack years: 45.00    Types: Cigarettes    Last attempt to quit: 11/06/1988    Years since quitting: 29.6  . Smokeless tobacco: Never Used  Substance Use Topics  . Alcohol use: Not Currently    Comment: None in 30 years but drank beer before  . Drug use: No    Family History Family History  Problem Relation Age of Onset  . Lung cancer Father 57  . Diabetes Mother   . Osteoporosis Mother   . Throat cancer Unknown        family hx - "bad living" - also lung CA, heart disease and renal failure    . Diabetes Sister   . Suicidality Son 86  . Lung cancer Maternal Grandmother   . Diabetes Maternal Grandfather   . Heart attack Paternal Grandmother     Surgical History Past Surgical History:  Procedure Laterality Date  . CARDIAC CATHETERIZATION N/A 09/19/2015   Procedure: Left Heart Cath and Coronary Angiography;  Surgeon: Adrian Prows, MD;  Location: [facility];  Service: Cardiovascular;  Laterality: N/A;  . CARPAL TUNNEL RELEASE    . CORONARY ARTERY BYPASS GRAFT N/A 09/22/2015   Procedure: CORONARY ARTERY BYPASS  GRAFTING (CABG) times four using the right greater saphenous vein harvested endoscopically and the left internal mammary artery.  LIMA-LAD, SEQ SVG-DIAG & OM, SVG-PD.;  Surgeon: [name], MD;  Location: [facility];  Service: Open Heart Surgery;  Laterality: N/A;  . EYE SURGERY    . I&D EXTREMITY Right 04/07/2018   Procedure: IRRIGATION AND DEBRIDEMENT ABSCESS RIGHT LEG;  Surgeon: Newt Minion, MD;  Location: [facility];  Service: Orthopedics;  Laterality: Right;  . L foot open repair jones fracture  2010    5th metetarsal   . left ankle ganglion cyst  1976  . left cataract  1996   right - 1997  . left CTS  2006   R CTS - 2007  . left foot surgery  1998   R surgery/fracture - 1999  . left wrist/hand fusion  2008  . [place], 2004  . NASAL SINUS SURGERY     multiple- x8. last 1997 with obliteration  . REPAIR THORACIC AORTA  2017  . right hand fracture  1969  . ROTATOR CUFF REPAIR  2006   R, than repeat 2011, Dr Theda Sers  . TRIGGER FINGER RELEASE Left 11/11/2014   Procedure: LEFT LONG FINGER RELEASE TRIGGER FINGER/A-1 PULLEY;  Surgeon: Milly Jakob, MD;  Location: [facility];  Service: Orthopedics;  Laterality: Left;  . vocal surgery  1996    Allergies  Allergen Reactions  . Ivp Dye [Iodinated Diagnostic Agents]     Blood Pressure dropped----pt was pre-medicated with 13 hour prep and did fine with pre-meds--amy 03/09/11   . Penicillins Nausea Only    REACTION: stomach pain(IV ok) Has patient had a PCN reaction causing immediate rash, facial/tongue/throat swelling, SOB or lightheadedness with hypotension: No Has patient had a PCN reaction causing severe rash involving mucus membranes or skin necrosis: No Has patient had a PCN reaction that required hospitalization: No Has patient had a PCN reaction  occurring within the last 10 years: No If all of the above answers are "NO", then may proceed with Cephalosporin use.   . Sulfa Antibiotics Hives  .  Sulfonamide Derivatives Hives and Itching  . Lisinopril Cough  . Statins Other (See Comments)    myalgias  . Propoxyphene N-Acetaminophen Other (See Comments)    Sharp pains- headache    Current Outpatient Medications  Medication Sig Dispense Refill  . ACCU-CHEK FASTCLIX LANCETS MISC Use as directed to check blood glucose. Dx: E11.51 102 each 6  . albuterol (PROAIR HFA) 108 (90 Base) MCG/ACT inhaler Inhale 2 puffs into the lungs every 6 (six) hours as needed for wheezing or shortness of breath.    . Blood Glucose Monitoring Suppl (ACCU-CHEK AVIVA PLUS) w/Device KIT Use as directed to check blood glucose daily. Dx: E11.51 1 kit 0  . buPROPion (WELLBUTRIN XL) 150 MG 24 hr tablet Take 150 mg by mouth every morning. Take 3 (450 mg) each morning    . celecoxib (CELEBREX) 200 MG capsule Take 1 capsule (200 mg total) by mouth daily. 30 capsule 2  . Cholecalciferol (VITAMIN D3) 5000 units TABS Take 5,000 Units by mouth daily.    . clonazePAM (KLONOPIN) 0.5 MG tablet Take 1 mg by mouth 2 (two) times daily as needed.     . clopidogrel (PLAVIX) 75 MG tablet Take 75 mg by mouth daily.    . cyclobenzaprine (FLEXERIL) 10 MG tablet Take 10 mg by mouth 3 (three) times daily as needed for muscle spasms.   0  . desonide (DESOWEN) 0.05 % cream Apply 1 application topically daily as needed (rosacea).     . doxycycline (VIBRAMYCIN) 100 MG capsule Take 1 capsule (100 mg total) by mouth 2 (two) times daily. 28 capsule 1  . fentaNYL (DURAGESIC - DOSED MCG/HR) 50 MCG/HR Place 50 mcg onto the skin every 3 (three) days.    . furosemide (LASIX) 20 MG tablet Take 20-40 mg by mouth See admin instructions. Take 2 tablets (40 mg) by mouth every morning and 1 tablet (20 mg) in the afternoon    . gabapentin (NEURONTIN) 400 MG capsule Take 800 mg by mouth daily. Take 1 (400 mg) each morning & 2 (800 mg) each evening    . glucose blood (ACCU-CHEK AVIVA PLUS) test strip 1 each by Other route as needed for other. Use as  instructed. Dx: E11.51 100 each 6  . IRON PO Take 1 capsule by mouth daily.    Marland Kitchen JANUVIA 100 MG tablet TAKE 1 TABLET BY MOUTH EVERY DAY 90 tablet 1  . losartan (COZAAR) 25 MG tablet TAKE 1 TABLET BY MOUTH EVERY DAY 90 tablet 0  . mineral oil-hydrophilic petrolatum (AQUAPHOR) ointment Apply topically 2 (two) times daily. Apply to lower extremities twice daily for dry skin until resolved. 420 g 0  . Multiple Vitamin (MULTIVITAMIN WITH MINERALS) TABS tablet Take 1 tablet by mouth daily.    . nitroGLYCERIN (NITROSTAT) 0.4 MG SL tablet Place 0.4 mg under the tongue every 5 (five) minutes as needed for chest pain.    Marland Kitchen omeprazole (PRILOSEC) 40 MG capsule Take one capsule by mouth twice daily 60 capsule 4  . potassium chloride (K-DUR) 10 MEQ tablet Take 10 mEq by mouth 2 (two) times daily.    Marland Kitchen rOPINIRole (REQUIP) 1 MG tablet For restless legs 60 tablet 2  . tamsulosin (FLOMAX) 0.4 MG CAPS capsule Take 1 capsule (0.4 mg total) by mouth daily. 180 capsule 0  . zonisamide (  ZONEGRAN) 50 MG capsule TAKE 3 CAPSULES(150 MG) BY MOUTH AT BEDTIME (Patient taking differently: Take 150 mg by mouth at bedtime. ) 270 capsule 1  . HYDROmorphone (DILAUDID) 2 MG tablet Take 2 mg by mouth every 6 (six) hours as needed for severe pain.      No current facility-administered medications for this visit.      REVIEW OF SYSTEMS: See HPI for pertinent positives and negatives.  Physical Examination Vitals:   06/23/18 1410  BP: (!) 157/84  Pulse: 78  Resp: 18  Temp: (!) 97 F (36.1 C)  TempSrc: Oral  SpO2: 99%  Weight: 195 lb (88.5 kg)  Height: 6' (1.829 m)   Body mass index is 26.45 kg/m.  General:  WDWN in NAD Gait: Normal HENT: WNL Eyes: Pupils equal Pulmonary: normal non-labored breathing, good air movement in all fields, CTAB, no rales, rhonchi, or wheezing Cardiac: RRR, no murmur detected Abdomen: soft, NT, no masses palpated Skin: no rashes, see Extremities  VASCULAR EXAM  Carotid Bruits Right  Left   Negative Negative      Radial pulses are 2+ palpable bilaterally   Adominal aortic pulse is palpable                      VASCULAR EXAM: Extremities without ischemic changes, without Gangrene; with open wounds: Bilateral heel ulcers, see photos below.    Right heel    Left heel                                                                                                           LE Pulses Right Left       FEMORAL  2+ palpable  3+ palpable        POPLITEAL  not palpable   not palpable       POSTERIOR TIBIAL  not palpable   not palpable        DORSALIS PEDIS      ANTERIOR TIBIAL 2+ palpable  not palpable     Musculoskeletal: no muscle wasting or atrophy; 2+ non pitting, 1+ pitting edema right lower leg.   Neurologic:  A&O X 3; appropriate affect, sensation is normal; speech is normal, CN 2-12 intact, pain and light touch intact in extremities, motor exam as listed above. Psychiatric: Normal thought content, mood appropriate to clinical situation.    ASSESSMENT:  [name] is a 73 y.o. male who is status post aortobifemoral bypass graft as well as left carotid endarterectomy in 2012.  He had what sounds like a left occular stroke in 2009, no stroke or TIA activity since then.   Dr. Donzetta Matters spoke with and examined pt. Will schedule pt to see Dr. Trula Slade to discuss whether arteriogram should be scheduled. Pt's podiatrist has been treating bilateral heel wounds since April 2019. Pt has a palpable right DP pulse.  Left outflow of aortobi fem bpg with 613 cm/s PSV.   DATA  Carotid Duplex (06-21-18): Right Carotid: Velocities in the right ICA are consistent with a 1-39% stenosis.Calcific plaque  may obscure higher velocity. Left Carotid: Patent left carotid endarterectomy site with ICA velocity in the 1- 39% stenosis range. Calcific plaque may obscure higher velocity. Right vertebral artery flow is retrograde, left is antegrade. Right subclavian artery  waveforms are monophasic, left are multiphasic.   Aortoiliac arterial duplex (06-21-18):  613 cm/s outflow velocity in the left.   ABI (Date: 06/23/2018):  R:   ABI: 1.29 (was 1.15 on 01-17-17),   PT: bi  DP: bi  TBI:  0.61, toe pressure 86  L:   ABI: 0.64 (was 0.69),   PT: mono  DP: mono  TBI: 0.44, toe pressure 62 Right ABI remains normal with biphasic waveforms. Slight decline in left ABI, moderate disease with monophasic waveforms.    PLAN:   Based on today's exam and non-invasive vascular lab results, the patient will be scheduled to see Dr. Trula Slade in office ASAP.  I discussed in depth with the patient the nature of atherosclerosis, and emphasized the importance of maximal medical management including strict control of blood pressure, blood glucose, and lipid levels, obtaining regular exercise, and cessation of smoking.  The patient is aware that without maximal medical management the underlying atherosclerotic disease process will progress, limiting the benefit of any interventions.  The patient was given information about stroke prevention and what symptoms should prompt the patient to seek immediate medical care.  The patient was given information about PAD including signs, symptoms, treatment, what symptoms should prompt the patient to seek immediate medical care, and risk reduction measures to take.  Thank you for allowing [place] to participate in this patient's care.  Clemon Chambers, RN, MSN, FNP-C [facility] Office: [phone]  Clinic MD: Donzetta Matters 06/23/2018 2:20 PM

## 2018-06-23 NOTE — Telephone Encounter (Signed)
Received Refill Request from [facility] High Alert Warning given. Pended Rx and sent to [facility] for approval.

## 2018-06-23 NOTE — Patient Instructions (Addendum)
Stroke Prevention Some health problems and behaviors may make it more likely for you to have a stroke. Below are ways to lessen your risk of having a stroke.  Be active for at least 30 minutes on most or all days.  Do not smoke. Try not to be around others who smoke.  Do not drink too much alcohol. ? Do not have more than 2 drinks a day if you are a man. ? Do not have more than 1 drink a day if you are a woman and are not pregnant.  Eat healthy foods, such as fruits and vegetables. If you were put on a specific diet, follow the diet as told.  Keep your cholesterol levels under control through diet and medicines. Look for foods that are low in saturated fat, trans fat, cholesterol, and are high in fiber.  If you have diabetes, follow all diet plans and take your medicine as told.  Ask your doctor if you need treatment to lower your blood pressure. If you have high blood pressure (hypertension), follow all diet plans and take your medicine as told by your doctor.  If you are 18-39 years old, have your blood pressure checked every 3-5 years. If you are age 40 or older, have your blood pressure checked every year.  Keep a healthy weight. Eat foods that are low in calories, salt, saturated fat, trans fat, and cholesterol.  Do not take drugs.  Avoid birth control pills, if this applies. Talk to your doctor about the risks of taking birth control pills.  Talk to your doctor if you have sleep problems (sleep apnea).  Take all medicine as told by your doctor. ? You may be told to take aspirin or blood thinner medicine. Take this medicine as told by your doctor. ? Understand your medicine instructions.  Make sure any other conditions you have are being taken care of.  Get help right away if:  You suddenly lose feeling (you feel numb) or have weakness in your face, arm, or leg.  Your face or eyelid hangs down to one side.  You suddenly feel confused.  You have trouble talking  (aphasia) or understanding what people are saying.  You suddenly have trouble seeing in one or both eyes.  You suddenly have trouble walking.  You are dizzy.  You lose your balance or your movements are clumsy (uncoordinated).  You suddenly have a very bad headache and you do not know the cause.  You have new chest pain.  Your heart feels like it is fluttering or skipping a beat (irregular heartbeat). Do not wait to see if the symptoms above go away. Get help right away. Call your local emergency services (911 in [place] Do not drive yourself to the hospital. This information is not intended to replace advice given to you by your health care provider. Make sure you discuss any questions you have with your health care provider. Document Released: 02/01/2012 Document Revised: 01/08/2016 Document Reviewed: 02/02/2013 Elsevier Interactive Patient Education  2018 Elsevier Inc.     Peripheral Vascular Disease Peripheral vascular disease (PVD) is a disease of the blood vessels that are not part of your heart and brain. A simple term for PVD is poor circulation. In most cases, PVD narrows the blood vessels that carry blood from your heart to the rest of your body. This can result in a decreased supply of blood to your arms, legs, and internal organs, like your stomach or kidneys. However, it most often affects   a person's lower legs and feet. There are two types of PVD.  Organic PVD. This is the more common type. It is caused by damage to the structure of blood vessels.  Functional PVD. This is caused by conditions that make blood vessels contract and tighten (spasm).  Without treatment, PVD tends to get worse over time. PVD can also lead to acute ischemic limb. This is when an arm or limb suddenly has trouble getting enough blood. This is a medical emergency. Follow these instructions at home:  Take medicines only as told by your doctor.  Do not use any tobacco products, including  cigarettes, chewing tobacco, or electronic cigarettes. If you need help quitting, ask your doctor.  Lose weight if you are overweight, and maintain a healthy weight as told by your doctor.  Eat a diet that is low in fat and cholesterol. If you need help, ask your doctor.  Exercise regularly. Ask your doctor for some good activities for you.  Take good care of your feet. ? Wear comfortable shoes that fit well. ? Check your feet often for any cuts or sores. Contact a doctor if:  You have cramps in your legs while walking.  You have leg pain when you are at rest.  You have coldness in a leg or foot.  Your skin changes.  You are unable to get or have an erection (erectile dysfunction).  You have cuts or sores on your feet that are not healing. Get help right away if:  Your arm or leg turns cold and blue.  Your arms or legs become red, warm, swollen, painful, or numb.  You have chest pain or trouble breathing.  You suddenly have weakness in your face, arm, or leg.  You become very confused or you cannot speak.  You suddenly have a very bad headache.  You suddenly cannot see. This information is not intended to replace advice given to you by your health care provider. Make sure you discuss any questions you have with your health care provider. Document Released: 10/27/2009 Document Revised: 01/08/2016 Document Reviewed: 01/10/2014 Elsevier Interactive Patient Education  2017 Elsevier Inc.  

## 2018-06-23 NOTE — Telephone Encounter (Signed)
Sent!

## 2018-06-23 NOTE — Telephone Encounter (Signed)
Yes max dose on this is 200 mg daily

## 2018-06-26 ENCOUNTER — Ambulatory Visit (INDEPENDENT_AMBULATORY_CARE_PROVIDER_SITE_OTHER): Payer: PRIVATE HEALTH INSURANCE | Admitting: Surgery

## 2018-06-26 ENCOUNTER — Ambulatory Visit (INDEPENDENT_AMBULATORY_CARE_PROVIDER_SITE_OTHER): Payer: PRIVATE HEALTH INSURANCE | Admitting: Orthopedic Surgery

## 2018-06-26 ENCOUNTER — Encounter: Payer: Self-pay | Admitting: Surgery

## 2018-06-26 ENCOUNTER — Encounter (INDEPENDENT_AMBULATORY_CARE_PROVIDER_SITE_OTHER): Payer: Self-pay | Admitting: Orthopedic Surgery

## 2018-06-26 VITALS — BP 149/62 | HR 82 | Temp 97.5°F | Resp 18 | Ht 72.0 in | Wt 194.0 lb

## 2018-06-26 DIAGNOSIS — E1151 Type 2 diabetes mellitus with diabetic peripheral angiopathy without gangrene: Secondary | ICD-10-CM

## 2018-06-26 DIAGNOSIS — I779 Disorder of arteries and arterioles, unspecified: Secondary | ICD-10-CM

## 2018-06-26 DIAGNOSIS — I771 Stricture of artery: Secondary | ICD-10-CM

## 2018-06-26 DIAGNOSIS — E08621 Diabetes mellitus due to underlying condition with foot ulcer: Secondary | ICD-10-CM

## 2018-06-26 DIAGNOSIS — L97421 Non-pressure chronic ulcer of left heel and midfoot limited to breakdown of skin: Secondary | ICD-10-CM

## 2018-06-26 DIAGNOSIS — I6523 Occlusion and stenosis of bilateral carotid arteries: Secondary | ICD-10-CM

## 2018-06-26 NOTE — Progress Notes (Signed)
Vascular and Vein Specialist of [facility]  Patient name: [name] MRN: [id number] DOB: Feb 19, 1946 Sex: male   REASON FOR VISIT:    Follow up  HISOTRY OF PRESENT ILLNESS:    Anthony Angusis a 72 y.o.malewho is status post aortobifemoral bypass graft as well as left carotid endarterectomy in 2012. He began complaining of leg swelling as well as prominent varicosities. and underwent laser ablation of the right small saphenous and left great saphenous vein 4 swelling and pain.  This had a significant improvement in his leg swelling.However, he is now having worsening swelling.  He is not wearing compression stockings.  He is s/p CABG in 2017. 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 The patient was felt to be at too great of risk to undergo concomitant innominate revascularization and CABG. He is asymptomatic from his innominate stenosis. He does not have any significant weakness in his right arm although he does state that it is sometimes more fatigue than the left. He has not had any TIA or stroke symptoms recently.  The patient has developed bilateral heel wounds since 2019.  He saw Dr. Sharol [name] earlier today.  He is continuing with wound care  The patient reports approximately 30 pound unintentional weight loss.  He is also dizzy when standing up.  He did not take his blood pressure medicine recently and this is help with his dizziness   PAST MEDICAL HISTORY:   Past Medical History:  Diagnosis Date  . Anemia    NOS  . Anxiety   . 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 BPH (benign prostatic hypertrophy)   . Broken foot Oct. 12, 2013   Right foot Fx  . CAD (coronary artery disease)   . Cellulitis and abscess of leg 03/2018   right leg  . Chronic hyponatremia   . Chronic pain syndrome   . COPD (chronic  obstructive pulmonary disease) ([place])   . Depression   . Diabetes mellitus   . Diabetic foot ulcer ([place])   . DJD (degenerative joint disease)    L wrist  . DM2 (diabetes mellitus, type 2) ([place])   . GERD (gastroesophageal reflux disease)   . HLD (hyperlipidemia)   . HTN (hypertension)   . Lumbar disc disease   . Osteoporosis    tx [facility], Dr Prudencio Burly, thought due to heavy steriod use after 1978  . Spine fracture    hx, multiple  . Varicose veins      FAMILY HISTORY:   Family History  Problem Relation Age of Onset  . Lung cancer Father 3  . Diabetes Mother   . Osteoporosis Mother   . Throat cancer Unknown        family hx - "bad living" - also lung CA, heart disease and renal failure    . Diabetes Sister   . Suicidality Son 15  . Lung cancer Maternal Grandmother   . Diabetes Maternal Grandfather   . Heart attack Paternal Grandmother     SOCIAL HISTORY:   Social History   Tobacco Use  . Smoking status: Former Smoker    Packs/day: 1.50    Years: 30.00    Pack years: 45.00    Types: Cigarettes    Last attempt to quit: 11/06/1988    Years since quitting: 29.6  . Smokeless tobacco: Never Used  Substance Use Topics  . Alcohol  use: Not Currently    Comment: None in 30 years but drank beer before     ALLERGIES:   Allergies  Allergen Reactions  . Ivp Dye [Iodinated Diagnostic Agents]     Blood Pressure dropped----pt was pre-medicated with 13 hour prep and did fine with pre-meds--amy 03/09/11   . Penicillins Nausea Only    REACTION: stomach pain(IV ok) Has patient had a PCN reaction causing immediate rash, facial/tongue/throat swelling, SOB or lightheadedness with hypotension: No Has patient had a PCN reaction causing severe rash involving mucus membranes or skin necrosis: No Has patient had a PCN reaction that required hospitalization: No Has patient had a PCN reaction occurring within the last 10 years: No If all of the above answers are "NO", then may proceed  with Cephalosporin use.   . Sulfa Antibiotics Hives  . Sulfonamide Derivatives Hives and Itching  . Lisinopril Cough  . Statins Other (See Comments)    myalgias  . Propoxyphene N-Acetaminophen Other (See Comments)    Sharp pains- headache     CURRENT MEDICATIONS:   Current Outpatient Medications  Medication Sig Dispense Refill  . ACCU-CHEK FASTCLIX LANCETS MISC Use as directed to check blood glucose. Dx: E11.51 102 each 6  . albuterol (PROAIR HFA) 108 (90 Base) MCG/ACT inhaler Inhale 2 puffs into the lungs every 6 (six) hours as needed for wheezing or shortness of breath.    . Blood Glucose Monitoring Suppl (ACCU-CHEK AVIVA PLUS) w/Device KIT Use as directed to check blood glucose daily. Dx: E11.51 1 kit 0  . buPROPion (WELLBUTRIN XL) 150 MG 24 hr tablet Take 150 mg by mouth every morning. Take 3 (450 mg) each morning    . celecoxib (CELEBREX) 200 MG capsule Take 1 capsule (200 mg total) by mouth daily. 30 capsule 2  . Cholecalciferol (VITAMIN D3) 5000 units TABS Take 5,000 Units by mouth daily.    . clonazePAM (KLONOPIN) 0.5 MG tablet Take 1 mg by mouth 2 (two) times daily as needed.     . clopidogrel (PLAVIX) 75 MG tablet Take 75 mg by mouth daily.    . cyclobenzaprine (FLEXERIL) 10 MG tablet Take 10 mg by mouth 3 (three) times daily as needed for muscle spasms.   0  . desonide (DESOWEN) 0.05 % cream Apply 1 application topically daily as needed (rosacea).     . doxycycline (VIBRAMYCIN) 100 MG capsule Take 1 capsule (100 mg total) by mouth 2 (two) times daily. 28 capsule 1  . fentaNYL (DURAGESIC - DOSED MCG/HR) 50 MCG/HR Place 50 mcg onto the skin every 3 (three) days.    . furosemide (LASIX) 20 MG tablet Take 20-40 mg by mouth See admin instructions. Take 2 tablets (40 mg) by mouth every morning and 1 tablet (20 mg) in the afternoon    . gabapentin (NEURONTIN) 400 MG capsule Take 800 mg by mouth daily. Take 1 (400 mg) each morning & 2 (800 mg) each evening    . glucose blood  (ACCU-CHEK AVIVA PLUS) test strip 1 each by Other route as needed for other. Use as instructed. Dx: E11.51 100 each 6  . HYDROmorphone (DILAUDID) 2 MG tablet Take 2 mg by mouth every 6 (six) hours as needed for severe pain.     . IRON PO Take 1 capsule by mouth daily.    Marland Kitchen JANUVIA 100 MG tablet TAKE 1 TABLET BY MOUTH EVERY DAY 90 tablet 1  . losartan (COZAAR) 25 MG tablet TAKE 1 TABLET BY MOUTH EVERY DAY 90  tablet 0  . mineral oil-hydrophilic petrolatum (AQUAPHOR) ointment Apply topically 2 (two) times daily. Apply to lower extremities twice daily for dry skin until resolved. 420 g 0  . Multiple Vitamin (MULTIVITAMIN WITH MINERALS) TABS tablet Take 1 tablet by mouth daily.    . nitroGLYCERIN (NITROSTAT) 0.4 MG SL tablet Place 0.4 mg under the tongue every 5 (five) minutes as needed for chest pain.    Marland Kitchen omeprazole (PRILOSEC) 40 MG capsule Take one capsule by mouth twice daily 60 capsule 4  . potassium chloride (K-DUR) 10 MEQ tablet Take 10 mEq by mouth 2 (two) times daily.    Marland Kitchen rOPINIRole (REQUIP) 1 MG tablet For restless legs 60 tablet 2  . tamsulosin (FLOMAX) 0.4 MG CAPS capsule Take 1 capsule (0.4 mg total) by mouth daily. 180 capsule 0  . zonisamide (ZONEGRAN) 50 MG capsule TAKE 3 CAPSULES(150 MG) BY MOUTH AT BEDTIME (Patient taking differently: Take 150 mg by mouth at bedtime. ) 270 capsule 1   No current facility-administered medications for this visit.     REVIEW OF SYSTEMS:   [X]  denotes positive finding, [ ]  denotes negative finding Cardiac  Comments:  Chest pain or chest pressure:    Shortness of breath upon exertion:    Short of breath when lying flat:    Irregular heart rhythm:        Vascular    Pain in calf, thigh, or hip brought on by ambulation:    Pain in feet at night that wakes you up from your sleep:     Blood clot in your veins:    Leg swelling:         Pulmonary    Oxygen at home:    Productive cough:     Wheezing:         Neurologic    Sudden weakness in  arms or legs:     Sudden numbness in arms or legs:     Sudden onset of difficulty speaking or slurred speech:    Temporary loss of vision in one eye:     Problems with dizziness:         Gastrointestinal    Blood in stool:     Vomited blood:         Genitourinary    Burning when urinating:     Blood in urine:        Psychiatric    Major depression:         Hematologic    Bleeding problems:    Problems with blood clotting too easily:        Skin    Rashes or ulcers:        Constitutional    Fever or chills:      PHYSICAL EXAM:   There were no vitals filed for this visit.  GENERAL: The patient is a well-nourished male, in no acute distress. The vital signs are documented above. CARDIAC: There is a regular rate and rhythm.  VASCULAR: Palpable right dorsalis pedis pulse, nonpalpable left PULMONARY: Non-labored respirations MUSCULOSKELETAL: There are no major deformities or cyanosis. NEUROLOGIC: No focal weakness or paresthesias are detected. SKIN: Bilateral heel ulcers PSYCHIATRIC: The patient has a normal affect.      STUDIES:   I reviewed his ultrasound studies with the following findings:  Carotid: Right Carotid: Velocities in the right ICA are consistent with a 1-39% stenosis.  Calcific plaque may obscure higher velocity.  Left Carotid: Patent left carotid endarterectomy site with ICA velocity  in the 1- 39% stenosis range. Calcific plaque may obscure higher velocity.  Aortoiliac: Abdominal Aorta: Right distal anastomosis measures approximately 1.84 x 1.66 cm. Left distal anastomosis measures approximately 2.14 x 2.03 cm.  Proximal aorta peak systolic velocity (cm/sec) proximal:66.6 , mid 65.4, distal 81.9.  Impression: Patent aorto-bifemoral bypass graft. The left common femoral outflow bifurcation has significant about of calcific plaque with velocity in the 75 - 99% stenosis  range.  +-------+-----------+-----------+------------+------------+ ABI/TBIToday's ABIToday's TBIPrevious ABIPrevious TBI +-------+-----------+-----------+------------+------------+ Right 1.29    0.61    1.15    0.61     +-------+-----------+-----------+------------+------------+ Left  0.64    0.44    0.69    0.28     +-------+-----------+-----------+------------+------------+  MEDICAL ISSUES:   Carotid stenosis:The patient has widely patent carotid arteries bilaterally with a widely patent left carotid endarterectomy site. He'll follow up in one year with a repeat ultrasound PVD, lower extremity:Aortobifem is widely patent.  ABIs remain stable.  The left is less than the right.  Toe pressures are diminished bilaterally.  Because the patient has active heel ulcerations and diabetes, I feel he needs to undergo angiography to better define his anatomy and to see if he has any options for revascularization.  This will need to be done from a left brachial approach.  Patient is a little reluctant to proceed with angiography but will contact me in the immediate future to schedule this.  I discussed that this was a limb threatening situation. 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 Based on the characteristics of this lesion, I do not think that he is a good candidate for stenting.  he is asymptomatic, so continue to follow this    Annamarie Major, MD Vascular and Vein Specialists of [facility] [phone] Pager [phone]

## 2018-06-26 NOTE — Progress Notes (Signed)
Office Visit Note   Patient: [name]           Date of Birth: [birth year]           MRN: [id number] Visit Date: 06/26/2018              Requested by: Anthony Cranker, DO [place], [place] [address] PCP: Anthony Chandler, NP  Chief Complaint  Patient presents with  . Left Foot - Open Wound      HPI: Patient is a 72 year old gentleman who presents in follow-up for a left heel decubitus ulcer he does have arterial insufficiency he has had a ultrasound which was positive for decreased flow he has seen Dr. Trula [name] this week for evaluation for possible surgical intervention.  Patient's currently on doxycycline he states that the wound does appear better while on the antibiotics.  Assessment & Plan: Visit Diagnoses:  1. 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 Diabetes mellitus type 2 with peripheral artery disease ([name])     Plan: Recommend following up with vascular surgery to see if there is any surgical intervention to improve his arterial inflow.  The ulcer is better and does not probe to bone or tendon superficial with good granulation tissue we will continue with current wound care pressure unloading and he will complete his antibiotics.  Follow-Up Instructions: Return in about 2 weeks (around 07/10/2018).   Ortho Exam  Patient is alert, oriented, no adenopathy, well-dressed, normal affect, normal respiratory effort. Examination patient has a faintly palpable dorsalis pedis and posterior tibial pulse.  The ulcer on the left heel is 15 mm in diameter 1 mm deep with healthy granulation tissue there is no sinus tract this does not probe to bone or tendon there is no cellulitis in the leg.  Patient does have an antalgic gait with varus alignment to both knees.  Imaging: No results found. No images are attached to the encounter.  Labs: Lab Results  Component Value Date     HGBA1C 6.6 (H) 03/21/2018   HGBA1C 6.6 12/01/2017   HGBA1C 6.5 07/28/2017   ESRSEDRATE 30 (H) 12/11/2016   ESRSEDRATE 12 10/17/2009   CRP <0.8 12/11/2016   REPTSTATUS 12/16/2016 FINAL 12/12/2016   GRAMSTAIN NO WBC SEEN NO ORGANISMS SEEN  12/12/2016   CULT  12/12/2016    RARE STAPHYLOCOCCUS AUREUS WITHIN NORMAL SKIN FLORA    LABORGA STAPHYLOCOCCUS AUREUS 12/12/2016     Lab Results  Component Value Date   ALBUMIN 2.6 (L) 04/02/2018   ALBUMIN 3.1 (L) 04/01/2018   ALBUMIN 3.5 12/11/2016    There is no height or weight on file to calculate BMI.  Orders:  No orders of the defined types were placed in this encounter.  No orders of the defined types were placed in this encounter.    Procedures: No procedures performed  Clinical Data: No additional findings.  ROS:  All other systems negative, except as noted in the HPI. Review of Systems  Objective: Vital Signs: There were no vitals taken for this visit.  Specialty Comments:  No specialty comments available.  PMFS History: Patient Active Problem List   Diagnosis Date Noted  . PTSD (post-traumatic stress disorder) 05/29/2018  . Abscess of right lower leg   . Severe protein-calorie malnutrition ([name])   . 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 Diabetic polyneuropathy associated with type  2 diabetes mellitus ([name])   . Cellulitis 04/01/2018  . Cellulitis of right lower extremity 04/01/2018  . Tachycardia 04/01/2018  . 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 Unstable angina ([name]) 09/20/2015  . Chest pain, rule out acute myocardial infarction 09/18/2015  . Diabetes mellitus type 2 with peripheral artery disease ([name]) 09/18/2015  . Hyponatremia 09/18/2015  . Chest pain on exertion 09/18/2015  . Varicose veins of bilateral lower extremities with other complications 31/49/7026  . Occlusion and stenosis of carotid artery without mention of cerebral infarction 10/18/2011  .  SINUSITIS, ACUTE 03/13/2010  . BACK PAIN 11/13/2009  . FATIGUE 10/17/2009  . Diabetic neuropathy associated with diabetes mellitus due to underlying condition ([name]) 08/29/2009  . Vitamin D deficiency 08/29/2009  . Hyposmolality and/or hyponatremia 08/29/2009  . VISUAL ACUITY, DECREASED, LEFT EYE 08/29/2009  . PERIPHERAL VASCULAR DISEASE 08/29/2009  . SINUSITIS, CHRONIC 08/29/2009  . NEPHROLITHIASIS 08/29/2009  . FIBROMYALGIA 08/29/2009  . BICEPS TENDON RUPTURE, RIGHT 08/11/2009  . DIABETES MELLITUS, TYPE II 08/05/2009  . Hyperlipidemia 08/05/2009  . ANEMIA-NOS 08/05/2009  . ANXIETY 08/05/2009  . DEPRESSION 08/05/2009  . Chronic pain syndrome 08/05/2009  . Essential hypertension 08/05/2009  . GERD 08/05/2009  . BENIGN PROSTATIC HYPERTROPHY 08/05/2009  . [place] DISEASE, LUMBAR 08/05/2009  . Osteoporosis 08/05/2009   Past Medical History:  Diagnosis Date  . Anemia    NOS  . Anxiety   . 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 BPH (benign prostatic hypertrophy)   . Broken foot Oct. 12, 2013   Right foot Fx  . CAD (coronary artery disease)   . Cellulitis and abscess of leg 03/2018   right leg  . Chronic hyponatremia   . Chronic pain syndrome   . COPD (chronic obstructive pulmonary disease) ([name])   . Depression   . Diabetes mellitus   . Diabetic foot ulcer ([name])   . DJD (degenerative joint disease)    L wrist  . DM2 (diabetes mellitus, type 2) ([name])   . GERD (gastroesophageal reflux disease)   . HLD (hyperlipidemia)   . HTN (hypertension)   . Lumbar disc disease   . Osteoporosis    tx [facility], Dr Prudencio Burly, thought due to heavy steriod use after 1978  . Spine fracture    hx, multiple  . Varicose veins     Family History  Problem Relation Age of Onset  . Lung cancer Father 70  . Diabetes Mother   . Osteoporosis Mother   . Throat cancer Unknown        family hx - "bad living" - also lung CA, heart disease and renal failure    . Diabetes Sister   .  Suicidality Son 54  . Lung cancer Maternal Grandmother   . Diabetes Maternal Grandfather   . Heart attack Paternal Grandmother     Past Surgical History:  Procedure Laterality Date  . CARDIAC CATHETERIZATION N/A 09/19/2015   Procedure: Left Heart Cath and Coronary Angiography;  Surgeon: Adrian Prows, MD;  Location: [facility];  Service: Cardiovascular;  Laterality: N/A;  . CARPAL TUNNEL RELEASE    . CORONARY ARTERY BYPASS GRAFT N/A 09/22/2015   Procedure: CORONARY ARTERY BYPASS GRAFTING (CABG) times four using the right greater saphenous vein harvested endoscopically and the left internal mammary artery.  LIMA-LAD, SEQ SVG-DIAG & OM, SVG-PD.;  Surgeon: [name], MD;  Location: [place] [facility];  Service: Open Heart Surgery;  Laterality: N/A;  . EYE  SURGERY    . I&D EXTREMITY Right 04/07/2018   Procedure: IRRIGATION AND DEBRIDEMENT ABSCESS RIGHT LEG;  Surgeon: Newt Minion, MD;  Location: [facility];  Service: Orthopedics;  Laterality: Right;  . L foot open repair jones fracture  2010    5th metetarsal   . left ankle ganglion cyst  1976  . left cataract  1996   right - 1997  . left CTS  2006   R CTS - 2007  . left foot surgery  1998   R surgery/fracture - 1999  . left wrist/hand fusion  2008  . [place], 2004  . NASAL SINUS SURGERY     multiple- x8. last 1997 with obliteration  . REPAIR THORACIC AORTA  2017  . right hand fracture  1969  . ROTATOR CUFF REPAIR  2006   R, than repeat 2011, Dr Theda Sers  . TRIGGER FINGER RELEASE Left 11/11/2014   Procedure: LEFT LONG FINGER RELEASE TRIGGER FINGER/A-1 PULLEY;  Surgeon: Milly Jakob, MD;  Location: [facility];  Service: Orthopedics;  Laterality: Left;  . vocal surgery  1996   Social History   Occupational History  . Not on file  Tobacco Use  . Smoking status: Former Smoker    Packs/day: 1.50    Years: 30.00    Pack years: 45.00    Types: Cigarettes    Last attempt to quit: 11/06/1988    Years since  quitting: 29.6  . Smokeless tobacco: Never Used  Substance and Sexual Activity  . Alcohol use: Not Currently    Comment: None in 30 years but drank beer before  . Drug use: No  . Sexual activity: Not Currently

## 2018-06-27 ENCOUNTER — Other Ambulatory Visit: Payer: Self-pay

## 2018-06-27 ENCOUNTER — Ambulatory Visit: Payer: Self-pay

## 2018-06-27 ENCOUNTER — Telehealth (INDEPENDENT_AMBULATORY_CARE_PROVIDER_SITE_OTHER): Payer: Self-pay | Admitting: Orthopedic Surgery

## 2018-06-27 ENCOUNTER — Telehealth: Payer: Self-pay | Admitting: *Deleted

## 2018-06-27 ENCOUNTER — Encounter: Payer: Self-pay | Admitting: *Deleted

## 2018-06-27 NOTE — Telephone Encounter (Signed)
[name], Nurse Case Manager called and stated that patient has Bilateral Heel Wounds and not getting consistent dressing changes. Stated that [facility] is not making consistent visits. Stated patient is going over a week without dressing change.  Patient has an appointment tomorrow with [name] and nurse stated she wants [name] to clarify wound Care provider with patient and hopefully it is not [facility] because he is not having much luck with them. If it is she is requesting new orders to be placed for patient or coordinate care with Dr. Sharol Given.   Confirmed appointment with Patient 06/28/18 @ 2:15

## 2018-06-27 NOTE — Telephone Encounter (Signed)
Arbie Cookey, nurse case manager left a message stating that the patient is not getting adequatewound care  and inconsistent visits with [facility].  She would like for Dr. Sharol Given to refer him to a different provider and not [facility].  CB#[phone].  Thank you.

## 2018-06-27 NOTE — Patient Outreach (Signed)
[facility]) Care Management  06/27/2018  [name] [facility] [birth year] [id number]  Unsuccessful outreach to the patient on today's date. BSW left a HIPAA compliant voice message requesting a return call.   Patient returned call, HIPAA identifiers confirmed. BSW spoke with patient about status of SCAT application. Patient understands Part B has been faxed to his primary physician and is to be completed during his office visit on 11/13.   Plan: BSW to continue to follow and assist with SCAT application submission.  Daneen Schick, BSW, CDP [facility] [phone]

## 2018-06-28 ENCOUNTER — Ambulatory Visit (INDEPENDENT_AMBULATORY_CARE_PROVIDER_SITE_OTHER): Payer: PRIVATE HEALTH INSURANCE | Admitting: Nurse Practitioner

## 2018-06-28 ENCOUNTER — Encounter: Payer: Self-pay | Admitting: Nurse Practitioner

## 2018-06-28 VITALS — BP 162/84 | HR 73 | Temp 98.1°F | Ht 72.0 in | Wt 197.0 lb

## 2018-06-28 DIAGNOSIS — E08621 Diabetes mellitus due to underlying condition with foot ulcer: Secondary | ICD-10-CM | POA: Diagnosis not present

## 2018-06-28 DIAGNOSIS — L97421 Non-pressure chronic ulcer of left heel and midfoot limited to breakdown of skin: Secondary | ICD-10-CM

## 2018-06-28 DIAGNOSIS — E1151 Type 2 diabetes mellitus with diabetic peripheral angiopathy without gangrene: Secondary | ICD-10-CM | POA: Diagnosis not present

## 2018-06-28 DIAGNOSIS — F339 Major depressive disorder, recurrent, unspecified: Secondary | ICD-10-CM

## 2018-06-28 DIAGNOSIS — G894 Chronic pain syndrome: Secondary | ICD-10-CM | POA: Diagnosis not present

## 2018-06-28 DIAGNOSIS — Z23 Encounter for immunization: Secondary | ICD-10-CM | POA: Diagnosis not present

## 2018-06-28 DIAGNOSIS — I10 Essential (primary) hypertension: Secondary | ICD-10-CM | POA: Diagnosis not present

## 2018-06-28 DIAGNOSIS — L97411 Non-pressure chronic ulcer of right heel and midfoot limited to breakdown of skin: Secondary | ICD-10-CM

## 2018-06-28 MED ORDER — CLONAZEPAM 1 MG PO TABS: 1.0000 mg | ORAL_TABLET | Freq: Every day | ORAL | Status: DC

## 2018-06-28 MED ORDER — POTASSIUM CHLORIDE ER 10 MEQ PO TBCR: 10.0000 meq | EXTENDED_RELEASE_TABLET | Freq: Every day | ORAL | Status: DC

## 2018-06-28 MED ORDER — FUROSEMIDE 20 MG PO TABS: 20.0000 mg | ORAL_TABLET | Freq: Every day | ORAL | Status: DC

## 2018-06-28 NOTE — Telephone Encounter (Signed)
[employer] [facility] called and states that [facility] had arranged through three way calling with case manager carol set up for [facility] visits for tuesdays and fridays the pt would not answer the door. Would not return calls. And the pt was not re certified for wound care. Need to get approval from another [facility] to see pt and also would like for them to include BP checks if possible. I advised I will make the referral

## 2018-06-28 NOTE — Telephone Encounter (Signed)
What wound care are you wanting provided for the pt by home health? Note says to continue with off loading but not specific wound care for left heel

## 2018-06-28 NOTE — Telephone Encounter (Signed)
I called and lm on vm for [name] with [facility] to follow upon wound care issues. [phone] at the last office visit 06/26/18 the appeared to be doing well and dr. Sharol Given wanted to refer to vascular to see if there was surgical revasc intervention needed. Wanting to check the visit history and see if there are any problems reaching the pt. Will hold message pending return call and then make referral as requested to possibly [facility] or [facility] to see the pt for wound visits.

## 2018-06-28 NOTE — Telephone Encounter (Signed)
Would use OTC  Or bactroban antibiotic ointment with dressing change daily

## 2018-06-28 NOTE — Progress Notes (Signed)
Careteam: Patient Care Team: [name], NP as PCP - General (Geriatric Medicine) Serafina Mitchell, MD as Consulting Physician (Vascular Surgery) Adrian Prows, MD as Consulting Physician (Cardiology) Jana Half, DPM as Consulting Physician (Podiatry) Newt Minion, MD as Consulting Physician (Orthopedic Surgery) Kriste Basque, MD as Referring Physician (Pain Medicine) Tobi Bastos, RN as [place] Management Daneen Schick as [place] Management  Advanced Directive information Does Patient Have a Medical Advance Directive?: No  Allergies  Allergen Reactions  . Ivp Dye [Iodinated Diagnostic Agents]     Blood Pressure dropped----pt was pre-medicated with 13 hour prep and did fine with pre-meds--amy 03/09/11   . Penicillins Nausea Only    REACTION: stomach pain(IV ok) Has patient had a PCN reaction causing immediate rash, facial/tongue/throat swelling, SOB or lightheadedness with hypotension: No Has patient had a PCN reaction causing severe rash involving mucus membranes or skin necrosis: No Has patient had a PCN reaction that required hospitalization: No Has patient had a PCN reaction occurring within the last 10 years: No If all of the above answers are "NO", then may proceed with Cephalosporin use.   . Sulfa Antibiotics Hives  . Sulfonamide Derivatives Hives and Itching  . Lisinopril Cough  . Statins Other (See Comments)    myalgias  . Propoxyphene N-Acetaminophen Other (See Comments)    Sharp pains- headache    Chief Complaint  Patient presents with  . Medical Management of Chronic Issues    Pt is being seen for a 3 month routine visit. Pt also needs SCAT forms completed.      HPI: Patient is a 72 y.o. male seen in the office today for routine follow up.   Diabetic ulcers- recently finished  doxycycline 100 mg BID Pt with heel ulcerations being followed by Dr Sharol Given, he was sent to Vascular for further  evaluation due to diabetic ulcerations,  Dr Trula Slade states "I feel he needs to undergo angiography to better define his anatomy and to see if he has any options for revascularization.  This will need to be done from a left brachial approach.  Patient is a little reluctant to proceed with angiography but will contact me in the immediate future to schedule this.  I discussed that this was a limb threatening situation" Right ulcer has been there about a year per pt.   Pt is status post aortobifemoral bypass graft as well as left carotid endarterectomy in 2012. Followed by vascular He had what sounds like a left occular stroke in 2009, no stroke or TIA activity since then.  DM- checks his blood sugar when he can find his things, "unsure why these get moved around so much" other people  coming in moving stuff.  A1c 6.6 in August. Taking januvia 100 mg daily   Last hospitalization in August after fall   Following with Dr Selinda Orion for pain management in [place]. Last OV last month. Pt with chronic back pain. Taking fentanyl 50 mcg with flexeril 10 mg PRN (rarely takes) along with celebrex 200 mg BID, and zonisamide 150 mg by mouth at bedtime.  Also on gabapentin per pain management   Anxiety/depression- followed by psychiatrist Currently on clonazepam 1 mg daily- once this was reduced RLS worsened and then he was started on requip 2 mg by mouth at bedtime- being prescribed by psychiatrist.  Also taking wellbutrin   BPH- using flomax 0.4 mg daily which has been effective. Has limited water closer to  bedtime.   GERD- using omeprazole 40 mg BID. He had chronic hoariness and was sent to ENT who doubled medication from daily to BID which improved symptoms. Has attempted to cut back but then could not eat due to increase in acid.   CAD- no current chest pains. Taking plavix 75 mg daily   Edema- placed on lasix he has reduced medication to daily because the swelling is better. Pt reports he is taking  lasix 20 mg daily also reduced potassium supplement daily    htn- dizziness on losartan so he has not taken for 3 days.   Pt needs SCAT form completed today  Review of Systems:  Review of Systems  Constitutional: Negative for chills and fever.  Cardiovascular: Positive for leg swelling.  Musculoskeletal: Positive for joint pain and myalgias.    Past Medical History:  Diagnosis Date  . Anemia    NOS  . Anxiety   . 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 BPH (benign prostatic hypertrophy)   . Broken foot Oct. 12, 2013   Right foot Fx  . CAD (coronary artery disease)   . Cellulitis and abscess of leg 03/2018   right leg  . Chronic hyponatremia   . Chronic pain syndrome   . COPD (chronic obstructive pulmonary disease) ([place])   . Depression   . Diabetes mellitus   . Diabetic foot ulcer ([place])   . DJD (degenerative joint disease)    L wrist  . DM2 (diabetes mellitus, type 2) ([place])   . GERD (gastroesophageal reflux disease)   . HLD (hyperlipidemia)   . HTN (hypertension)   . Lumbar disc disease   . Osteoporosis    tx per [facility], Dr Prudencio Burly, thought due to heavy steriod use after 1978  . Spine fracture    hx, multiple  . Varicose veins    Past Surgical History:  Procedure Laterality Date  . CARDIAC CATHETERIZATION N/A 09/19/2015   Procedure: Left Heart Cath and Coronary Angiography;  Surgeon: Adrian Prows, MD;  Location: [facility];  Service: Cardiovascular;  Laterality: N/A;  . CARPAL TUNNEL RELEASE    . CORONARY ARTERY BYPASS GRAFT N/A 09/22/2015   Procedure: CORONARY ARTERY BYPASS GRAFTING (CABG) times four using the right greater saphenous vein harvested endoscopically and the left internal mammary artery.  LIMA-LAD, SEQ SVG-DIAG & OM, SVG-PD.;  Surgeon: [name], MD;  Location: [facility];  Service: Open Heart Surgery;  Laterality: N/A;  . EYE SURGERY    . I&D EXTREMITY Right 04/07/2018   Procedure: IRRIGATION AND DEBRIDEMENT ABSCESS RIGHT LEG;   Surgeon: Newt Minion, MD;  Location: [facility];  Service: Orthopedics;  Laterality: Right;  . L foot open repair jones fracture  2010    5th metetarsal   . left ankle ganglion cyst  1976  . left cataract  1996   right - 1997  . left CTS  2006   R CTS - 2007  . left foot surgery  1998   R surgery/fracture - 1999  . left wrist/hand fusion  2008  . [place], 2004  . NASAL SINUS SURGERY     multiple- x8. last 1997 with obliteration  . REPAIR THORACIC AORTA  2017  . right hand fracture  1969  . ROTATOR CUFF REPAIR  2006   R, than repeat 2011, Dr Theda Sers  . TRIGGER FINGER RELEASE Left 11/11/2014   Procedure: LEFT LONG FINGER RELEASE TRIGGER  FINGER/A-1 PULLEY;  Surgeon: Milly Jakob, MD;  Location: [facility];  Service: Orthopedics;  Laterality: Left;  . vocal surgery  1996   Social History:   reports that he quit smoking about 29 years ago. His smoking use included cigarettes. He has a 45.00 pack-year smoking history. He has never used smokeless tobacco. He reports that he drank alcohol. He reports that he does not use drugs.  Family History  Problem Relation Age of Onset  . Lung cancer Father 59  . Diabetes Mother   . Osteoporosis Mother   . Throat cancer Unknown        family hx - "bad living" - also lung CA, heart disease and renal failure    . Diabetes Sister   . Suicidality Son 69  . Lung cancer Maternal Grandmother   . Diabetes Maternal Grandfather   . Heart attack Paternal Grandmother     Medications: Patient's Medications  New Prescriptions   No medications on file  Previous Medications   ACCU-CHEK FASTCLIX LANCETS MISC    Use as directed to check blood glucose. Dx: E11.51   ALBUTEROL (PROAIR HFA) 108 (90 BASE) MCG/ACT INHALER    Inhale 2 puffs into the lungs every 6 (six) hours as needed for wheezing or shortness of breath.   BLOOD GLUCOSE MONITORING SUPPL (ACCU-CHEK AVIVA PLUS) W/DEVICE KIT    Use as directed to check blood glucose  daily. Dx: E11.51   BUPROPION (WELLBUTRIN XL) 150 MG 24 HR TABLET    Take 150 mg by mouth every morning. Take 3 (450 mg) each morning   CELECOXIB (CELEBREX) 200 MG CAPSULE    Take 200 mg by mouth 2 (two) times daily.   CHOLECALCIFEROL (VITAMIN D3) 5000 UNITS TABS    Take 5,000 Units by mouth daily.   CLONAZEPAM (KLONOPIN) 0.5 MG TABLET    Take 1 mg by mouth 2 (two) times daily as needed.    CLOPIDOGREL (PLAVIX) 75 MG TABLET    Take 75 mg by mouth daily.   CYCLOBENZAPRINE (FLEXERIL) 10 MG TABLET    Take 10 mg by mouth 3 (three) times daily as needed for muscle spasms.    DESONIDE (DESOWEN) 0.05 % CREAM    Apply 1 application topically daily as needed (rosacea).    FENTANYL (DURAGESIC - DOSED MCG/HR) 50 MCG/HR    Place 50 mcg onto the skin every 3 (three) days.   FUROSEMIDE (LASIX) 20 MG TABLET    Take 20 mg by mouth daily. Take 2 tablets (40 mg) by mouth every morning and 1 tablet (20 mg) in the afternoon   GABAPENTIN (NEURONTIN) 400 MG CAPSULE    Take 800 mg by mouth daily. Take 1 (400 mg) each morning & 2 (800 mg) each evening   GLUCOSE BLOOD (ACCU-CHEK AVIVA PLUS) TEST STRIP    1 each by Other route as needed for other. Use as instructed. Dx: E11.51   IRON PO    Take 1 capsule by mouth daily.   JANUVIA 100 MG TABLET    TAKE 1 TABLET BY MOUTH EVERY DAY   LOSARTAN (COZAAR) 25 MG TABLET    TAKE 1 TABLET BY MOUTH EVERY DAY   MINERAL OIL-HYDROPHILIC PETROLATUM (AQUAPHOR) OINTMENT    Apply topically 2 (two) times daily. Apply to lower extremities twice daily for dry skin until resolved.   MULTIPLE VITAMIN (MULTIVITAMIN WITH MINERALS) TABS TABLET    Take 1 tablet by mouth daily.   NITROGLYCERIN (NITROSTAT) 0.4 MG SL TABLET  Place 0.4 mg under the tongue every 5 (five) minutes as needed for chest pain.   OMEPRAZOLE (PRILOSEC) 40 MG CAPSULE    Take one capsule by mouth twice daily   POTASSIUM CHLORIDE (K-DUR) 10 MEQ TABLET    Take 10 mEq by mouth 2 (two) times daily.   ROPINIROLE (REQUIP) 1 MG TABLET     Take 2 mg by mouth at bedtime.   TAMSULOSIN (FLOMAX) 0.4 MG CAPS CAPSULE    Take 1 capsule (0.4 mg total) by mouth daily.   ZONISAMIDE (ZONEGRAN) 50 MG CAPSULE    TAKE 3 CAPSULES(150 MG) BY MOUTH AT BEDTIME  Modified Medications   No medications on file  Discontinued Medications   CELECOXIB (CELEBREX) 200 MG CAPSULE    Take 1 capsule (200 mg total) by mouth daily.   DOXYCYCLINE (VIBRAMYCIN) 100 MG CAPSULE    Take 1 capsule (100 mg total) by mouth 2 (two) times daily.   HYDROMORPHONE (DILAUDID) 2 MG TABLET    Take 2 mg by mouth every 6 (six) hours as needed for severe pain.    ROPINIROLE (REQUIP) 1 MG TABLET    For restless legs     Physical Exam:  Vitals:   06/28/18 1407  BP: (!) 162/84  Pulse: 73  Temp: 98.1 F (36.7 C)  TempSrc: Oral  SpO2: 98%  Weight: 197 lb (89.4 kg)  Height: 6' (1.829 m)   Body mass index is 26.72 kg/m.  Physical Exam  Constitutional: He is oriented to person, place, and time. He appears well-developed and well-nourished. No distress.  HENT:  Head: Normocephalic.  Nose: Nose normal.  Mouth/Throat: No oropharyngeal exudate.  Eyes: Pupils are equal, round, and reactive to light. Right eye exhibits no discharge. Left eye exhibits no discharge. No scleral icterus.  Neck: Normal range of motion. No JVD present. No thyromegaly present.  Cardiovascular: Normal rate, regular rhythm, normal heart sounds and intact distal pulses. Exam reveals no gallop and no friction rub.  No murmur heard. Pulmonary/Chest: Effort normal and breath sounds normal. No respiratory distress. He has no wheezes. He has no rales.  Abdominal: Soft. Bowel sounds are normal. He exhibits no distension and no mass. There is no tenderness. There is no rebound and no guarding.  Musculoskeletal: He exhibits edema (3+ pitting). He exhibits no tenderness.  Unsteady gait ambulates with right hand cane Right leg edema knee high hose in place.   Lymphadenopathy:    He has no cervical  adenopathy.  Neurological: He is oriented to person, place, and time. He has normal strength. Gait abnormal.  Decreased sensation to both feet   Skin: Skin is warm and dry. No rash noted. No erythema. No pallor.  Left heel  Ulcer 1.5 x2 cm wound bed pink surrounding skin without any redness.No drainage noted.   Right heel 1 x 1.5 cm wound bed red without any drainage and no signs of infection on surrounding skin tissue.   Psychiatric: He has a normal mood and affect. His speech is normal and behavior is normal. Judgment and thought content normal.    Labs reviewed: Basic Metabolic Panel: Recent Labs    04/06/18 0309 04/13/18 1356 04/28/18 1948  NA 131* 128* 133*  K 4.3 4.6 3.6  CL 95* 91* 97*  CO2 27 29 25   GLUCOSE 113* 128* 141*  BUN 11 14 22   CREATININE 0.69 0.93 0.97  CALCIUM 8.3* 9.2 9.0   Liver Function Tests: Recent Labs    12/01/17  04/01/18 1359 04/02/18  3299 04/13/18 1356  AST 23   < > 22 18 24   ALT 15   < > 15 13 17   ALKPHOS 70  --  95 81  --   BILITOT  --    < > 0.7 0.8 0.4  PROT  --    < > 7.2 6.2* 7.2  ALBUMIN  --   --  3.1* 2.6*  --    < > = values in this interval not displayed.   No results for input(s): LIPASE, AMYLASE in the last 8760 hours. No results for input(s): AMMONIA in the last 8760 hours. CBC: Recent Labs    04/01/18 1359  04/06/18 0309 04/13/18 1356 04/28/18 1948  WBC 7.1   < > 7.2 6.3 4.4  NEUTROABS 4.8  --   --  3,692 2.6  HGB 12.1*   < > 10.7* 11.7* 11.8*  HCT 37.5*   < > 33.0* 35.0* 36.6*  MCV 95.7   < > 94.3 90.9 95.3  PLT 332   < > 342 419* 183   < > = values in this interval not displayed.   Lipid Panel: Recent Labs    12/01/17 03/21/18 0810  CHOL 140 139  HDL 50 37*  LDLCALC 79 86  TRIG 57 71  CHOLHDL  --  3.8   TSH: No results for input(s): TSH in the last 8760 hours. A1C: Lab Results  Component Value Date   HGBA1C 6.6 (H) 03/21/2018     Assessment/Plan 1. Diabetic ulcer of left heel associated with  diabetes mellitus due to underlying condition, limited to breakdown of skin ([place]) 2. Diabetic ulcer of right heel associated with diabetes mellitus due to underlying condition, limited to breakdown of skin [facility]) Reviewed notes in epic from Dr Sharol Given and Russella Dar, ulcer appear unchanged, no signs of infection. He has completed doxycyline. Apparently home health had signed off on him prematurely and this is why they have not been back to his home. We have informed Dr Gavin Potters office and they plan on placing a new referral for ongoing home care.   3. Diabetes mellitus type 2 with peripheral artery disease ([place]) Stress importance of control with diabetes. Pt reports he is compliant with medication. Encouraged ongoing dietary modifications.  - Hemoglobin A1c  4. Essential hypertension Very dizzy and light headed after he takes losartan at home, states his blood pressures are much lower in the home setting. We will have home health monitor blood pressures as well.  - COMPLETE METABOLIC PANEL WITH GFR - CBC with Differential/Platelets  5. Chronic pain syndrome Continues to be followed by pain management. On multiple medications for pain putting him at risk for adverse drug reaction, question if some of the dizziness is not related to this.   6. Recurrent major depressive disorder, remission status unspecified (HCC) Stable on current regimen.   7. Need for influenza vaccination - Flu vaccine HIGH DOSE PF (Fluzone High dose)  Next appt: 2 weeks blood pressure  Kayani Rapaport K. [place], [facility] [phone]

## 2018-06-28 NOTE — Patient Instructions (Addendum)
Hold off on losartan, take blood pressure at home with home cuff. We will call home health to help monitor and report back to [place]  Keep follow ups with Dr Sharol Given and wound care

## 2018-06-29 ENCOUNTER — Other Ambulatory Visit (INDEPENDENT_AMBULATORY_CARE_PROVIDER_SITE_OTHER): Payer: Self-pay

## 2018-06-29 LAB — CBC WITH DIFFERENTIAL/PLATELET
Basophils Absolute: 41 cells/uL (ref 0–200)
Basophils Relative: 0.7 %
EOS ABS: 207 {cells}/uL (ref 15–500)
Eosinophils Relative: 3.5 %
HEMATOCRIT: 37.4 % — AB (ref 38.5–50.0)
HEMOGLOBIN: 12.7 g/dL — AB (ref 13.2–17.1)
LYMPHS ABS: 944 {cells}/uL (ref 850–3900)
MCH: 30.2 pg (ref 27.0–33.0)
MCHC: 34 g/dL (ref 32.0–36.0)
MCV: 88.8 fL (ref 80.0–100.0)
MPV: 9.3 fL (ref 7.5–12.5)
Monocytes Relative: 13.2 %
NEUTROS ABS: 3929 {cells}/uL (ref 1500–7800)
Neutrophils Relative %: 66.6 %
Platelets: 280 10*3/uL (ref 140–400)
RBC: 4.21 10*6/uL (ref 4.20–5.80)
RDW: 13.3 % (ref 11.0–15.0)
Total Lymphocyte: 16 %
WBC: 5.9 10*3/uL (ref 3.8–10.8)
WBCMIX: 779 {cells}/uL (ref 200–950)

## 2018-06-29 LAB — COMPLETE METABOLIC PANEL WITH GFR
AG RATIO: 1.2 (calc) (ref 1.0–2.5)
ALT: 11 U/L (ref 9–46)
AST: 19 U/L (ref 10–35)
Albumin: 3.7 g/dL (ref 3.6–5.1)
Alkaline phosphatase (APISO): 89 U/L (ref 40–115)
BILIRUBIN TOTAL: 0.3 mg/dL (ref 0.2–1.2)
BUN: 19 mg/dL (ref 7–25)
CHLORIDE: 97 mmol/L — AB (ref 98–110)
CO2: 29 mmol/L (ref 20–32)
Calcium: 9 mg/dL (ref 8.6–10.3)
Creat: 0.86 mg/dL (ref 0.70–1.18)
GFR, Est African American: 100 mL/min/{1.73_m2} (ref >=60)
GFR, Est Non African American: 87 mL/min/{1.73_m2} (ref >=60)
GLOBULIN: 3.2 g/dL (ref 1.9–3.7)
Glucose, Bld: 100 mg/dL (ref 65–139)
POTASSIUM: 4.7 mmol/L (ref 3.5–5.3)
SODIUM: 132 mmol/L — AB (ref 135–146)
Total Protein: 6.9 g/dL (ref 6.1–8.1)

## 2018-06-29 LAB — HEMOGLOBIN A1C
EAG (MMOL/L): 7.7 (calc)
Hgb A1c MFr Bld: 6.5 % of total Hgb — ABNORMAL HIGH (ref <5.7)
Mean Plasma Glucose: 140 (calc)

## 2018-06-29 NOTE — Telephone Encounter (Signed)
I called and sw carol ext [phone]. Advised that I spoke with [facility]iva and that the pt stated that he was having his wound care provided in the office with Dr. Sharol Given and that they did not need to come so the pt was d/c. Arbie Cookey states that the pt said no one ever came to the house and that [facility] " threw out dressing supplies" in his kitchen garbage. I advised that I would send a referral to [facility] home health [facility] for wound care. I will fax an order for social work eval as well per request to see what community based programs the pt may qualify for.

## 2018-06-30 ENCOUNTER — Telehealth (INDEPENDENT_AMBULATORY_CARE_PROVIDER_SITE_OTHER): Payer: Self-pay

## 2018-06-30 NOTE — Telephone Encounter (Signed)
Tiffany with [facility] called stating that patient's insurance is out of network and to try [facility].  Stated if you have any questions to please give her a call.  Cb# is [phone].  Please advise.  Thank you.

## 2018-07-03 ENCOUNTER — Ambulatory Visit: Payer: No Typology Code available for payment source | Admitting: Physician Assistant

## 2018-07-03 ENCOUNTER — Telehealth (INDEPENDENT_AMBULATORY_CARE_PROVIDER_SITE_OTHER): Payer: Self-pay | Admitting: Orthopedic Surgery

## 2018-07-03 ENCOUNTER — Encounter: Payer: Self-pay | Admitting: Physician Assistant

## 2018-07-03 ENCOUNTER — Other Ambulatory Visit: Payer: Self-pay

## 2018-07-03 DIAGNOSIS — F331 Major depressive disorder, recurrent, moderate: Secondary | ICD-10-CM | POA: Diagnosis not present

## 2018-07-03 DIAGNOSIS — F411 Generalized anxiety disorder: Secondary | ICD-10-CM

## 2018-07-03 DIAGNOSIS — G2581 Restless legs syndrome: Secondary | ICD-10-CM | POA: Insufficient documentation

## 2018-07-03 DIAGNOSIS — F22 Delusional disorders: Secondary | ICD-10-CM | POA: Insufficient documentation

## 2018-07-03 MED ORDER — CLONAZEPAM 0.5 MG PO TABS: 0.5000 mg | ORAL_TABLET | Freq: Two times a day (BID) | ORAL | 0 refills | Status: DC | PRN

## 2018-07-03 NOTE — Telephone Encounter (Signed)
Order faxed to [facility]. Asked for a call back to advise if they are not able to service the pt. I am wanting social work to see what services they may be able to offer the pt and HHN to assist with teaching and education of wound care and dressing.

## 2018-07-03 NOTE — Patient Outreach (Signed)
[facility]) Care Management  07/03/2018  Robson Trickey [facility] 1946-07-30 [id number]  Successful outreach to the patient on today's date, HIPAA identifiers confirmed. BSW discussed recent SCAT application submission. BSW updated the patient that the next step involves him contacting SCAT to arrange an eligibility interview. The patient stated understanding. BSW provided the patient with the contact number to SCAT. BSW encouraged the patient to contact SCAT today in order to establish reliable transportation. The patient stated understanding.  Plan: BSW to perform a discipline closure as the patients social work goals have been met. BSW to update [facility] RN CM [name] of closure.  Daneen Schick, BSW, CDP [facility] [phone]

## 2018-07-03 NOTE — Progress Notes (Signed)
[facility] Med Check  Patient ID: [name],  MRN: [id number]  PCP: [name], NP  Date of Evaluation: 07/03/2018 Time spent:15 minutes  Chief Complaint:  Chief Complaint    Follow-up      HISTORY/CURRENT STATUS: HPI Here for 3 month med check.  Patient denies loss of interest in usual activities and is able to enjoy things.  Denies decreased energy or motivation.  Appetite has not changed.  No extreme sadness, tearfulness, or feelings of hopelessness.  Denies any changes in concentration, making decisions or remembering things.  Denies suicidal or homicidal thoughts.  Continues to think the FBI is following him. 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 States they've messed up many of his cars and they've gone to [employer] and made his insurance go up.  They stopped his mail and he just about lost both of his life insurance policies. States "won't let me alone.They never will.  I'll go to my grave with them bothering me."    Anxiety is well-controlled with the Klonopin.  We have tried to wean him off of it but unsuccessful because the anxiety was not controlled at the time.  His pain is fairly well controlled with his medications.  He sees pain management.  He sleeps fairly well.  The restless legs are a problem some days but usually only a couple of days a week.   Individual Medical History/ Review of Systems: Changes? :No    Past medications for mental health diagnoses include: Wellbutrin XL, Risperdal, Prozac, Zoloft, Paxil, Tofranil, gabapentin, ropinirole, Klonopin  Allergies: Ivp dye [iodinated diagnostic agents]; Penicillins; Sulfa antibiotics; Sulfonamide derivatives; Lisinopril; Statins; and Propoxyphene n-acetaminophen  Current Medications:  Current Outpatient Medications:  .  ACCU-CHEK FASTCLIX LANCETS MISC, Use as directed to  check blood glucose. Dx: E11.51, Disp: 102 each, Rfl: 6 .  albuterol (PROAIR HFA) 108 (90 Base) MCG/ACT inhaler, Inhale 2 puffs into the lungs every 6 (six) hours as needed for wheezing or shortness of breath., Disp: , Rfl:  .  Blood Glucose Monitoring Suppl (ACCU-CHEK AVIVA PLUS) w/Device KIT, Use as directed to check blood glucose daily. Dx: E11.51, Disp: 1 kit, Rfl: 0 .  buPROPion (WELLBUTRIN XL) 150 MG 24 hr tablet, Take 150 mg by mouth every morning. Take 3 (450 mg) each morning, Disp: , Rfl:  .  celecoxib (CELEBREX) 200 MG capsule, Take 200 mg by mouth 2 (two) times daily., Disp: , Rfl:  .  Cholecalciferol (VITAMIN D3) 5000 units TABS, Take 5,000 Units by mouth daily., Disp: , Rfl:  .  clopidogrel (PLAVIX) 75 MG tablet, Take 75 mg by mouth daily., Disp: , Rfl:  .  cyclobenzaprine (FLEXERIL) 10 MG tablet, Take 10 mg by mouth 3 (three) times daily as needed for muscle spasms. , Disp: , Rfl: 0 .  desonide (DESOWEN) 0.05 % cream, Apply 1 application topically daily as needed (rosacea). , Disp: , Rfl:  .  fentaNYL (DURAGESIC - DOSED MCG/HR) 50 MCG/HR, Place 50 mcg onto the skin every 3 (three) days., Disp: , Rfl:  .  furosemide (LASIX) 20 MG tablet, Take 1 tablet (20 mg total) by mouth daily., Disp: 30 tablet, Rfl:  .  gabapentin (NEURONTIN) 400 MG capsule, Take 800 mg by mouth daily. Take 1 (400 mg) each morning & 2 (800 mg) each evening, Disp: , Rfl:  .  glucose blood (ACCU-CHEK AVIVA  PLUS) test strip, 1 each by Other route as needed for other. Use as instructed. Dx: E11.51, Disp: 100 each, Rfl: 6 .  IRON PO, Take 1 capsule by mouth daily., Disp: , Rfl:  .  JANUVIA 100 MG tablet, TAKE 1 TABLET BY MOUTH EVERY DAY, Disp: 90 tablet, Rfl: 1 .  Multiple Vitamin (MULTIVITAMIN WITH MINERALS) TABS tablet, Take 1 tablet by mouth daily., Disp: , Rfl:  .  nitroGLYCERIN (NITROSTAT) 0.4 MG SL tablet, Place 0.4 mg under the tongue every 5 (five) minutes as needed for chest pain., Disp: , Rfl:  .  omeprazole  (PRILOSEC) 40 MG capsule, Take one capsule by mouth twice daily, Disp: 60 capsule, Rfl: 4 .  potassium chloride (K-DUR) 10 MEQ tablet, Take 1 tablet (10 mEq total) by mouth daily., Disp: , Rfl:  .  rOPINIRole (REQUIP) 1 MG tablet, Take 2 mg by mouth at bedtime., Disp: , Rfl:  .  tamsulosin (FLOMAX) 0.4 MG CAPS capsule, Take 1 capsule (0.4 mg total) by mouth daily., Disp: 180 capsule, Rfl: 0 .  zonisamide (ZONEGRAN) 50 MG capsule, TAKE 3 CAPSULES(150 MG) BY MOUTH AT BEDTIME, Disp: 270 capsule, Rfl: 1 .  clonazePAM (KLONOPIN) 0.5 MG tablet, Take 1 tablet (0.5 mg total) by mouth 2 (two) times daily as needed for anxiety., Disp: 60 tablet, Rfl: 0 .  losartan (COZAAR) 25 MG tablet, TAKE 1 TABLET BY MOUTH EVERY DAY (Patient not taking: Reported on 06/28/2018), Disp: 90 tablet, Rfl: 0 .  mineral oil-hydrophilic petrolatum (AQUAPHOR) ointment, Apply topically 2 (two) times daily. Apply to lower extremities twice daily for dry skin until resolved. (Patient not taking: Reported on 07/03/2018), Disp: 420 g, Rfl: 0 Medication Side Effects: none  Family Medical/ Social History: Changes? No  MENTAL HEALTH EXAM:  There were no vitals taken for this visit.There is no height or weight on file to calculate BMI.  General Appearance: Casual walks with a cane  Eye Contact:  Good  Speech:  Talkative his normal  Volume:  Normal  Mood:  Euthymic  Affect:  Appropriate  Thought Process:  Goal Directed  Orientation:  Full (Time, Place, and Person)  Thought Content:  Paranoid ideations  Suicidal Thoughts:  No  Homicidal Thoughts:  No  Memory:  WNL  Judgement:  Fair  Insight:  Fair  Psychomotor Activity:  Normal  Concentration:  Concentration: Good  Recall:  Good  Fund of Knowledge: Good  Language: Good  Assets:  Desire for Improvement  ADL's:  Intact  Cognition: WNL  Prognosis:  Good    DIAGNOSES:    ICD-10-CM   1. Major depressive disorder, recurrent episode, moderate (HCC) F33.1   2. Generalized  anxiety disorder F41.1   3. Restless leg syndrome G25.81   4. Paranoia ([place]) [place]     Receiving Psychotherapy: No    RECOMMENDATIONS: I would like to start an antipsychotic on Abrar because I do think it would help with the paranoid symptoms.  At some point last year, I started Risperdal, but he is very educated, looks online and realized that the medication is indicated for psychosis for one thing so he stopped it.  He states that people do not believe him but these things are really happening.  Since he is not a threat to himself or others, I am not pushing plan of him going to psychotic either by mouth or injection.  He will not comply with either one. Continue all current medications. Return in 4 to 6 weeks or sooner as  needed.   Donnal Moat, PA-C

## 2018-07-03 NOTE — Telephone Encounter (Signed)
I called pt's case manager and lm on vm to advise that I have tried several other [facility] and not able to get him sch. I advised that I was going to call the pt and have him come in this week so Dr. Sharol Given can take a look at the heel and make sure he is not losing any ground and provide instruction on daily care and dressing application. To call with questions. Pt will come in tomorrow at 12:15

## 2018-07-03 NOTE — Telephone Encounter (Signed)
Lori with [facility] called to let Dr. Sharol Given know that they will be unable to see patient due to staffing. FYI

## 2018-07-04 ENCOUNTER — Ambulatory Visit (INDEPENDENT_AMBULATORY_CARE_PROVIDER_SITE_OTHER): Payer: PRIVATE HEALTH INSURANCE | Admitting: Orthopedic Surgery

## 2018-07-04 ENCOUNTER — Telehealth (INDEPENDENT_AMBULATORY_CARE_PROVIDER_SITE_OTHER): Payer: Self-pay

## 2018-07-04 ENCOUNTER — Encounter (INDEPENDENT_AMBULATORY_CARE_PROVIDER_SITE_OTHER): Payer: Self-pay | Admitting: Orthopedic Surgery

## 2018-07-04 VITALS — Ht 72.0 in | Wt 197.0 lb

## 2018-07-04 DIAGNOSIS — L97919 Non-pressure chronic ulcer of unspecified part of right lower leg with unspecified severity: Secondary | ICD-10-CM

## 2018-07-04 DIAGNOSIS — L97421 Non-pressure chronic ulcer of left heel and midfoot limited to breakdown of skin: Secondary | ICD-10-CM | POA: Diagnosis not present

## 2018-07-04 DIAGNOSIS — I87311 Chronic venous hypertension (idiopathic) with ulcer of right lower extremity: Secondary | ICD-10-CM

## 2018-07-04 DIAGNOSIS — E08621 Diabetes mellitus due to underlying condition with foot ulcer: Secondary | ICD-10-CM

## 2018-07-04 NOTE — Telephone Encounter (Signed)
Pt has appt today will discuss at visit.

## 2018-07-04 NOTE — Telephone Encounter (Signed)
Received call from Arbie Cookey who is a case manager trying to coordinate care for patient. She said she wanted you to know that she did receive your message but has concerns that patient cannot see his own wound so she feels that teaching patient wound care is not a good option. She said you could reach her back at [phone] ext [phone]

## 2018-07-04 NOTE — Telephone Encounter (Signed)
I called and lm on vm to advise that the pt was in the office today and did demonstrate the ability to look at his heel wound by crossing his leg and resting the ankle on his knee. We applied a promagran or prisma dressing cut to fit to the ulcer and applied tape to cover. This will stay intact x 3 days and the pt will reapply himself at home and follow up back in the office on Monday. He verbalized understanding, he demonstrated the ability to provide his own wound care. We will monitor closely at the office and call with any changes.

## 2018-07-06 ENCOUNTER — Encounter (INDEPENDENT_AMBULATORY_CARE_PROVIDER_SITE_OTHER): Payer: Self-pay | Admitting: Orthopedic Surgery

## 2018-07-06 NOTE — Progress Notes (Signed)
Office Visit Note   Patient: [name]           Date of Birth: 18-Feb-1946           MRN: [id number] Visit Date: 07/04/2018              Requested by: [name], NP [place], [place] [address] PCP: [name], NP  Chief Complaint  Patient presents with  . Right Foot - Wound Check  . Left Foot - Wound Check      HPI: Patient is a 72 year old gentleman who presents in follow-up for bilateral heel ulcers.  Patient states he does have pain denies any odor denies any systemic symptoms.  Assessment & Plan: Visit Diagnoses:  1. 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  Follow-Up Instructions: Return in about 3 weeks (around 07/25/2018).   Ortho Exam  Patient is alert, oriented, no adenopathy, well-dressed, normal affect, normal respiratory effort. Examination of both legs patient has healthy granulating bed on both heel ulcers.  The right heel ulcer is 10 mm in diameter 0.1 mm deep the left heel ulcer is 10 x 20 mm and 0.1 mm deep.  They both have healthy granulation tissue they do not probe to bone or tendon there is no cellulitis no odor no drainage.  Imaging: No results found. No images are attached to the encounter.  Labs: Lab Results  Component Value Date   HGBA1C 6.5 (H) 06/28/2018   HGBA1C 6.6 (H) 03/21/2018   HGBA1C 6.6 12/01/2017   ESRSEDRATE 30 (H) 12/11/2016   ESRSEDRATE 12 10/17/2009   CRP <0.8 12/11/2016   REPTSTATUS 12/16/2016 FINAL 12/12/2016   GRAMSTAIN NO WBC SEEN NO ORGANISMS SEEN  12/12/2016   CULT  12/12/2016    RARE STAPHYLOCOCCUS AUREUS WITHIN NORMAL SKIN FLORA    LABORGA STAPHYLOCOCCUS AUREUS 12/12/2016     Lab Results  Component Value Date   ALBUMIN 2.6 (L) 04/02/2018   ALBUMIN 3.1 (L) 04/01/2018   ALBUMIN 3.5 12/11/2016    Body mass index is 26.72 kg/m.  Orders:  No orders of the defined types were placed in this encounter.  No orders of the defined types were placed in this encounter.    Procedures: No procedures performed  Clinical Data: No additional findings.  ROS:  All other systems negative, except as noted in the HPI. Review of Systems  Objective: Vital Signs: Ht 6' (1.829 m)   Wt 197 lb (89.4 kg)   BMI 26.72 kg/m   Specialty Comments:  No specialty comments available.  PMFS History: Patient Active Problem List   Diagnosis Date Noted  . Major depressive disorder, recurrent episode, moderate ([place]) 07/03/2018  . Generalized anxiety disorder 07/03/2018  . Restless leg syndrome 07/03/2018  . Paranoia ([place]) 07/03/2018  . PTSD (post-traumatic stress disorder) 05/29/2018  . Abscess of right lower leg   . Severe protein-calorie malnutrition ([place])   . 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 Diabetic polyneuropathy associated with type 2 diabetes mellitus ([place])   . Cellulitis 04/01/2018  . Cellulitis of right lower extremity 04/01/2018  . Tachycardia 04/01/2018  . 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 Unstable angina ([place]) 09/20/2015  . Chest pain,  rule out acute myocardial infarction 09/18/2015  . Diabetes mellitus type 2 with peripheral artery disease ([place]) 09/18/2015  . Hyponatremia 09/18/2015  . Chest pain on exertion 09/18/2015  . Varicose veins of bilateral lower extremities with other complications 09/38/1829  . Occlusion and stenosis of carotid artery without mention of cerebral infarction 10/18/2011  . SINUSITIS, ACUTE 03/13/2010  . BACK PAIN 11/13/2009  . FATIGUE 10/17/2009  . Diabetic neuropathy associated with diabetes mellitus due to underlying condition ([place]) 08/29/2009  . Vitamin D deficiency 08/29/2009  . Hyposmolality and/or hyponatremia  08/29/2009  . VISUAL ACUITY, DECREASED, LEFT EYE 08/29/2009  . PERIPHERAL VASCULAR DISEASE 08/29/2009  . SINUSITIS, CHRONIC 08/29/2009  . NEPHROLITHIASIS 08/29/2009  . FIBROMYALGIA 08/29/2009  . BICEPS TENDON RUPTURE, RIGHT 08/11/2009  . DIABETES MELLITUS, TYPE II 08/05/2009  . Hyperlipidemia 08/05/2009  . ANEMIA-NOS 08/05/2009  . ANXIETY 08/05/2009  . DEPRESSION 08/05/2009  . Chronic pain syndrome 08/05/2009  . Essential hypertension 08/05/2009  . GERD 08/05/2009  . BENIGN PROSTATIC HYPERTROPHY 08/05/2009  . [place] DISEASE, LUMBAR 08/05/2009  . Osteoporosis 08/05/2009   Past Medical History:  Diagnosis Date  . Anemia    NOS  . Anxiety   . 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 BPH (benign prostatic hypertrophy)   . Broken foot Oct. 12, 2013   Right foot Fx  . CAD (coronary artery disease)   . Cellulitis and abscess of leg 03/2018   right leg  . Chronic hyponatremia   . Chronic pain syndrome   . COPD (chronic obstructive pulmonary disease) ([place])   . Depression   . Diabetes mellitus   . Diabetic foot ulcer ([place])   . DJD (degenerative joint disease)    L wrist  . DM2 (diabetes mellitus, type 2) ([place])   . GERD (gastroesophageal reflux disease)   . HLD (hyperlipidemia)   . HTN (hypertension)   . Lumbar disc disease   . Osteoporosis    tx per [facility], Dr Prudencio Burly, thought due to heavy steriod use after 1978  . Spine fracture    hx, multiple  . Varicose veins     Family History  Problem Relation Age of Onset  . Lung cancer Father 12  . Diabetes Mother   . Osteoporosis Mother   . Throat cancer Unknown        family hx - "bad living" - also lung CA, heart disease and renal failure    . Diabetes Sister   . Suicidality Son 39  . Lung cancer Maternal Grandmother   . Diabetes Maternal Grandfather   . Heart attack Paternal Grandmother     Past Surgical History:  Procedure Laterality Date  . CARDIAC CATHETERIZATION N/A 09/19/2015   Procedure: Left Heart  Cath and Coronary Angiography;  Surgeon: Adrian Prows, MD;  Location: [facility];  Service: Cardiovascular;  Laterality: N/A;  . CARPAL TUNNEL RELEASE    . CORONARY ARTERY BYPASS GRAFT N/A 09/22/2015   Procedure: CORONARY ARTERY BYPASS GRAFTING (CABG) times four using the right greater saphenous vein harvested endoscopically and the left internal mammary artery.  LIMA-LAD, SEQ SVG-DIAG & OM, SVG-PD.;  Surgeon: [name], MD;  Location: [facility];  Service: Open Heart Surgery;  Laterality: N/A;  . EYE SURGERY    . I&D EXTREMITY Right 04/07/2018   Procedure: IRRIGATION AND DEBRIDEMENT ABSCESS RIGHT LEG;  Surgeon: Newt Minion, MD;  Location: [facility];  Service: Orthopedics;  Laterality: Right;  . L foot  open repair jones fracture  2010    5th metetarsal   . left ankle ganglion cyst  1976  . left cataract  1996   right - 1997  . left CTS  2006   R CTS - 2007  . left foot surgery  1998   R surgery/fracture - 1999  . left wrist/hand fusion  2008  . [place], 2004  . NASAL SINUS SURGERY     multiple- x8. last 1997 with obliteration  . REPAIR THORACIC AORTA  2017  . right hand fracture  1969  . ROTATOR CUFF REPAIR  2006   R, than repeat 2011, Dr Theda Sers  . TRIGGER FINGER RELEASE Left 11/11/2014   Procedure: LEFT LONG FINGER RELEASE TRIGGER FINGER/A-1 PULLEY;  Surgeon: Milly Jakob, MD;  Location: [facility];  Service: Orthopedics;  Laterality: Left;  . vocal surgery  1996   Social History   Occupational History  . Not on file  Tobacco Use  . Smoking status: Former Smoker    Packs/day: 1.50    Years: 30.00    Pack years: 45.00    Types: Cigarettes    Last attempt to quit: 11/06/1988    Years since quitting: 29.6  . Smokeless tobacco: Never Used  Substance and Sexual Activity  . Alcohol use: Not Currently    Comment: None in 30 years but drank beer before  . Drug use: No  . Sexual activity: Not Currently

## 2018-07-07 ENCOUNTER — Ambulatory Visit (INDEPENDENT_AMBULATORY_CARE_PROVIDER_SITE_OTHER): Payer: Self-pay | Admitting: Physician Assistant

## 2018-07-11 ENCOUNTER — Encounter (INDEPENDENT_AMBULATORY_CARE_PROVIDER_SITE_OTHER): Payer: Self-pay | Admitting: Orthopedic Surgery

## 2018-07-11 ENCOUNTER — Other Ambulatory Visit: Payer: Self-pay | Admitting: *Deleted

## 2018-07-11 ENCOUNTER — Ambulatory Visit (INDEPENDENT_AMBULATORY_CARE_PROVIDER_SITE_OTHER): Payer: PRIVATE HEALTH INSURANCE | Admitting: Physician Assistant

## 2018-07-11 VITALS — Ht 72.0 in | Wt 197.0 lb

## 2018-07-11 DIAGNOSIS — E08621 Diabetes mellitus due to underlying condition with foot ulcer: Secondary | ICD-10-CM

## 2018-07-11 DIAGNOSIS — E1151 Type 2 diabetes mellitus with diabetic peripheral angiopathy without gangrene: Secondary | ICD-10-CM

## 2018-07-11 DIAGNOSIS — L97919 Non-pressure chronic ulcer of unspecified part of right lower leg with unspecified severity: Secondary | ICD-10-CM

## 2018-07-11 DIAGNOSIS — E440 Moderate protein-calorie malnutrition: Secondary | ICD-10-CM | POA: Diagnosis not present

## 2018-07-11 DIAGNOSIS — I87311 Chronic venous hypertension (idiopathic) with ulcer of right lower extremity: Secondary | ICD-10-CM | POA: Diagnosis not present

## 2018-07-11 DIAGNOSIS — L97421 Non-pressure chronic ulcer of left heel and midfoot limited to breakdown of skin: Secondary | ICD-10-CM

## 2018-07-11 NOTE — Telephone Encounter (Signed)
I was under the impression his pain doctor filled these medications.

## 2018-07-11 NOTE — Progress Notes (Signed)
Office Visit Note   Patient: [name]           Date of Birth: Mar 15, 1946           MRN: [id number] Visit Date: 07/11/2018              Requested by: [name], NP [place], [place] [address] PCP: [name], NP  Chief Complaint  Patient presents with  . Right Foot - Follow-up  . Left Foot - Follow-up      HPI: The patient is a 72 year old male who is seen for follow-up of his bilateral heel ulcers.  He has been using silver Promogran to the area and reports that he is pleased with the progress.  He reports he is not receiving home health.  Assessment & Plan: Visit Diagnoses:  1. 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 Diabetes mellitus type 2 with peripheral artery disease ([place])   4. Moderate protein malnutrition ([facility])     Plan: Continue silver pomegranate to the heel wounds every 2 to 3 days.  Recommended compression stockings to bilateral lower extremities for edema control and the patient states that he does have these at home.  We utilized an Ace wrap today on the right side and a diabetic sock on the left side as the right side is more swollen than the left.  He will follow-up in about 10 days or sooner should he have difficulties in the interim.  Follow-Up Instructions: Return in about 9 days (around 07/20/2018).   Ortho Exam  Patient is alert, oriented, no adenopathy, well-dressed, normal affect, normal respiratory effort. The right lower extremity is more edematous today.  The calf is approximately 42 cm.  He does not want to have a compression wrap placed as he reports that this worsens his pain significantly.  The left lower extremity has edema but not as marked as the right today.  Bilateral heels have nickel sized wounds laterally which have pink  granulation tissue present.  There is no odor and scant drainage.  There is no signs of cellulitis.  Imaging: No results found. No images are attached to the encounter.  Labs: Lab Results  Component Value Date   HGBA1C 6.5 (H) 06/28/2018   HGBA1C 6.6 (H) 03/21/2018   HGBA1C 6.6 12/01/2017   ESRSEDRATE 30 (H) 12/11/2016   ESRSEDRATE 12 10/17/2009   CRP <0.8 12/11/2016   REPTSTATUS 12/16/2016 FINAL 12/12/2016   GRAMSTAIN NO WBC SEEN NO ORGANISMS SEEN  12/12/2016   CULT  12/12/2016    RARE STAPHYLOCOCCUS AUREUS WITHIN NORMAL SKIN FLORA    LABORGA STAPHYLOCOCCUS AUREUS 12/12/2016     Lab Results  Component Value Date   ALBUMIN 2.6 (L) 04/02/2018   ALBUMIN 3.1 (L) 04/01/2018   ALBUMIN 3.5 12/11/2016    Body mass index is 26.72 kg/m.  Orders:  No orders of the defined types were placed in this encounter.  No orders of the defined types were placed in this encounter.    Procedures: No procedures performed  Clinical Data: No additional findings.  ROS:  All other systems negative, except as noted in the HPI. Review of Systems  Objective: Vital Signs: Ht 6' (1.829 m)   Wt 197 lb (89.4 kg)   BMI 26.72 kg/m   Specialty Comments:  No  specialty comments available.  PMFS History: Patient Active Problem List   Diagnosis Date Noted  . Major depressive disorder, recurrent episode, moderate ([place]) 07/03/2018  . Generalized anxiety disorder 07/03/2018  . Restless leg syndrome 07/03/2018  . Paranoia ([place]) 07/03/2018  . PTSD (post-traumatic stress disorder) 05/29/2018  . Abscess of right lower leg   . Severe protein-calorie malnutrition ([place])   . 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 Diabetic polyneuropathy associated with type 2 diabetes mellitus ([place])   . Cellulitis 04/01/2018  . Cellulitis of right lower extremity 04/01/2018  . Tachycardia 04/01/2018  . 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 Unstable angina ([place]) 09/20/2015   . Chest pain, rule out acute myocardial infarction 09/18/2015  . Diabetes mellitus type 2 with peripheral artery disease ([place]) 09/18/2015  . Hyponatremia 09/18/2015  . Chest pain on exertion 09/18/2015  . Varicose veins of bilateral lower extremities with other complications 16/05/9603  . Occlusion and stenosis of carotid artery without mention of cerebral infarction 10/18/2011  . SINUSITIS, ACUTE 03/13/2010  . BACK PAIN 11/13/2009  . FATIGUE 10/17/2009  . Diabetic neuropathy associated with diabetes mellitus due to underlying condition ([place]) 08/29/2009  . Vitamin D deficiency 08/29/2009  . Hyposmolality and/or hyponatremia 08/29/2009  . VISUAL ACUITY, DECREASED, LEFT EYE 08/29/2009  . PERIPHERAL VASCULAR DISEASE 08/29/2009  . SINUSITIS, CHRONIC 08/29/2009  . NEPHROLITHIASIS 08/29/2009  . FIBROMYALGIA 08/29/2009  . BICEPS TENDON RUPTURE, RIGHT 08/11/2009  . DIABETES MELLITUS, TYPE II 08/05/2009  . Hyperlipidemia 08/05/2009  . ANEMIA-NOS 08/05/2009  . ANXIETY 08/05/2009  . DEPRESSION 08/05/2009  . Chronic pain syndrome 08/05/2009  . Essential hypertension 08/05/2009  . GERD 08/05/2009  . BENIGN PROSTATIC HYPERTROPHY 08/05/2009  . [place] DISEASE, LUMBAR 08/05/2009  . Osteoporosis 08/05/2009   Past Medical History:  Diagnosis Date  . Anemia    NOS  . Anxiety   . 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 BPH (benign prostatic hypertrophy)   . Broken foot Oct. 12, 2013   Right foot Fx  . CAD (coronary artery disease)   . Cellulitis and abscess of leg 03/2018   right leg  . Chronic hyponatremia   . Chronic pain syndrome   . COPD (chronic obstructive pulmonary disease) ([place])   . Depression   . Diabetes mellitus   . Diabetic foot ulcer ([place])   . DJD (degenerative joint disease)    L wrist  . DM2 (diabetes mellitus, type 2) ([place])   . GERD (gastroesophageal reflux disease)   . HLD (hyperlipidemia)   . HTN (hypertension)   . Lumbar disc disease   .  Osteoporosis    tx per [facility], Dr Prudencio Burly, thought due to heavy steriod use after 1978  . Spine fracture    hx, multiple  . Varicose veins     Family History  Problem Relation Age of Onset  . Lung cancer Father 6  . Diabetes Mother   . Osteoporosis Mother   . Throat cancer Unknown        family hx - "bad living" - also lung CA, heart disease and renal failure    . Diabetes Sister   . Suicidality Son 7  . Lung cancer Maternal Grandmother   . Diabetes Maternal Grandfather   . Heart attack Paternal Grandmother     Past Surgical History:  Procedure Laterality Date  . CARDIAC CATHETERIZATION N/A 09/19/2015   Procedure: Left Heart Cath and Coronary  Angiography;  Surgeon: Adrian Prows, MD;  Location: [facility];  Service: Cardiovascular;  Laterality: N/A;  . CARPAL TUNNEL RELEASE    . CORONARY ARTERY BYPASS GRAFT N/A 09/22/2015   Procedure: CORONARY ARTERY BYPASS GRAFTING (CABG) times four using the right greater saphenous vein harvested endoscopically and the left internal mammary artery.  LIMA-LAD, SEQ SVG-DIAG & OM, SVG-PD.;  Surgeon: [name], MD;  Location: [facility];  Service: Open Heart Surgery;  Laterality: N/A;  . EYE SURGERY    . I&D EXTREMITY Right 04/07/2018   Procedure: IRRIGATION AND DEBRIDEMENT ABSCESS RIGHT LEG;  Surgeon: Newt Minion, MD;  Location: [facility];  Service: Orthopedics;  Laterality: Right;  . L foot open repair jones fracture  2010    5th metetarsal   . left ankle ganglion cyst  1976  . left cataract  1996   right - 1997  . left CTS  2006   R CTS - 2007  . left foot surgery  1998   R surgery/fracture - 1999  . left wrist/hand fusion  2008  . [place], 2004  . NASAL SINUS SURGERY     multiple- x8. last 1997 with obliteration  . REPAIR THORACIC AORTA  2017  . right hand fracture  1969  . ROTATOR CUFF REPAIR  2006   R, than repeat 2011, Dr Theda Sers  . TRIGGER FINGER RELEASE Left 11/11/2014   Procedure: LEFT LONG FINGER RELEASE TRIGGER  FINGER/A-1 PULLEY;  Surgeon: Milly Jakob, MD;  Location: [facility];  Service: Orthopedics;  Laterality: Left;  . vocal surgery  1996   Social History   Occupational History  . Not on file  Tobacco Use  . Smoking status: Former Smoker    Packs/day: 1.50    Years: 30.00    Pack years: 45.00    Types: Cigarettes    Last attempt to quit: 11/06/1988    Years since quitting: 29.6  . Smokeless tobacco: Never Used  Substance and Sexual Activity  . Alcohol use: Not Currently    Comment: None in 30 years but drank beer before  . Drug use: No  . Sexual activity: Not Currently

## 2018-07-11 NOTE — Telephone Encounter (Signed)
Patient requesting refill to be sent to mail order for Rx's .  Pended and sent to [facility] for Approval due to [place].

## 2018-07-12 ENCOUNTER — Other Ambulatory Visit: Payer: Self-pay | Admitting: *Deleted

## 2018-07-12 NOTE — Telephone Encounter (Signed)
Patient notified and will call pain Dr.

## 2018-07-12 NOTE — Patient Outreach (Signed)
[facility]) Care Management  07/12/2018  [name] 30-Jul-1946 [id number]    RN attempted outreach call today however unsuccessful. Unable to leave a HIPAA approved voice message. Will rescheduled another outreach call over the next week for ongoing [facility] services.  [name], RN Care Management Coordinator [place] Office [phone]

## 2018-07-17 ENCOUNTER — Ambulatory Visit (INDEPENDENT_AMBULATORY_CARE_PROVIDER_SITE_OTHER): Payer: PRIVATE HEALTH INSURANCE | Admitting: Orthopedic Surgery

## 2018-07-17 ENCOUNTER — Other Ambulatory Visit: Payer: Self-pay | Admitting: *Deleted

## 2018-07-17 ENCOUNTER — Encounter (INDEPENDENT_AMBULATORY_CARE_PROVIDER_SITE_OTHER): Payer: Self-pay | Admitting: Orthopedic Surgery

## 2018-07-17 VITALS — Ht 72.0 in | Wt 197.0 lb

## 2018-07-17 DIAGNOSIS — L97919 Non-pressure chronic ulcer of unspecified part of right lower leg with unspecified severity: Secondary | ICD-10-CM

## 2018-07-17 DIAGNOSIS — I87311 Chronic venous hypertension (idiopathic) with ulcer of right lower extremity: Secondary | ICD-10-CM

## 2018-07-17 DIAGNOSIS — E08622 Diabetes mellitus due to underlying condition with other skin ulcer: Secondary | ICD-10-CM

## 2018-07-17 DIAGNOSIS — L97421 Non-pressure chronic ulcer of left heel and midfoot limited to breakdown of skin: Secondary | ICD-10-CM | POA: Diagnosis not present

## 2018-07-17 DIAGNOSIS — L97912 Non-pressure chronic ulcer of unspecified part of right lower leg with fat layer exposed: Secondary | ICD-10-CM

## 2018-07-17 DIAGNOSIS — E08621 Diabetes mellitus due to underlying condition with foot ulcer: Secondary | ICD-10-CM | POA: Diagnosis not present

## 2018-07-17 NOTE — Progress Notes (Signed)
Office Visit Note   Patient: [name]           Date of Birth: Mar 05, 1946           MRN: [id number] Visit Date: 07/17/2018              Requested by: [name], NP [place], [place] [address] PCP: [name], NP  Chief Complaint  Patient presents with  . Right Foot - Follow-up  . Left Foot - Follow-up      HPI: Patient is a 72 year old gentleman who presents in follow-up for bilateral heel lateral ulcers.  Patient is on Plavix.  Patient pulled off his Band-Aid in the office and has been bleeding from where he developed a skin tear on the plantar aspect of the right foot.  Patient is wearing knee-high cotton socks without compression.  Patient states that the Promogran sample that he was given he lost or someone stole it.  Assessment & Plan: Visit Diagnoses:  1. 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 Diabetic ulcer of right lower leg associated with diabetes mellitus due to underlying condition, with fat layer exposed ([place])     Plan: Patient was given a new sample of the Promogran collagen.  Promogran plus 4 x 4 plus a Covan wrap was applied to both feet recommended changing the Promogran every 3 days.  Reevaluate in 4 weeks.  Follow-Up Instructions: Return in about 4 weeks (around 08/14/2018).   Ortho Exam  Patient is alert, oriented, no adenopathy, well-dressed, normal affect, normal respiratory effort. Examination patient has a good dorsalis pedis pulse bilaterally he has venous changes.  The ulcers over both heels are about 7 mm in diameter 1 mm deep these do not probe to bone but patient has a very thin fat pad over the heel.  There is no redness no cellulitis no odor no drainage no signs of infection.  Patient does have a skin tear on the plantar aspect the  right foot where he tore off the Band-Aid.  There is bleeding from the skin tear.  Imaging: No results found. No images are attached to the encounter.  Labs: Lab Results  Component Value Date   HGBA1C 6.5 (H) 06/28/2018   HGBA1C 6.6 (H) 03/21/2018   HGBA1C 6.6 12/01/2017   ESRSEDRATE 30 (H) 12/11/2016   ESRSEDRATE 12 10/17/2009   CRP <0.8 12/11/2016   REPTSTATUS 12/16/2016 FINAL 12/12/2016   GRAMSTAIN NO WBC SEEN NO ORGANISMS SEEN  12/12/2016   CULT  12/12/2016    RARE STAPHYLOCOCCUS AUREUS WITHIN NORMAL SKIN FLORA    LABORGA STAPHYLOCOCCUS AUREUS 12/12/2016     Lab Results  Component Value Date   ALBUMIN 2.6 (L) 04/02/2018   ALBUMIN 3.1 (L) 04/01/2018   ALBUMIN 3.5 12/11/2016    Body mass index is 26.72 kg/m.  Orders:  No orders of the defined types were placed in this encounter.  No orders of the defined types were placed in this encounter.    Procedures: No procedures performed  Clinical Data: No additional findings.  ROS:  All other systems negative, except as noted in the HPI. Review of Systems  Objective: Vital Signs: Ht 6' (1.829 m)   Wt 197 lb (89.4 kg)   BMI 26.72 kg/m   Specialty Comments:  No specialty comments  available.  PMFS History: Patient Active Problem List   Diagnosis Date Noted  . Major depressive disorder, recurrent episode, moderate ([place]) 07/03/2018  . Generalized anxiety disorder 07/03/2018  . Restless leg syndrome 07/03/2018  . Paranoia ([place]) 07/03/2018  . PTSD (post-traumatic stress disorder) 05/29/2018  . Abscess of right lower leg   . Severe protein-calorie malnutrition ([place])   . 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 Diabetic polyneuropathy associated with type 2 diabetes mellitus ([place])   . Cellulitis 04/01/2018  . Cellulitis of right lower extremity 04/01/2018  . Tachycardia 04/01/2018  . 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 Unstable angina ([place]) 09/20/2015  . Chest pain,  rule out acute myocardial infarction 09/18/2015  . Diabetes mellitus type 2 with peripheral artery disease ([place]) 09/18/2015  . Hyponatremia 09/18/2015  . Chest pain on exertion 09/18/2015  . Varicose veins of bilateral lower extremities with other complications 08/65/7846  . Occlusion and stenosis of carotid artery without mention of cerebral infarction 10/18/2011  . SINUSITIS, ACUTE 03/13/2010  . BACK PAIN 11/13/2009  . FATIGUE 10/17/2009  . Diabetic neuropathy associated with diabetes mellitus due to underlying condition ([place]) 08/29/2009  . Vitamin D deficiency 08/29/2009  . Hyposmolality and/or hyponatremia 08/29/2009  . VISUAL ACUITY, DECREASED, LEFT EYE 08/29/2009  . PERIPHERAL VASCULAR DISEASE 08/29/2009  . SINUSITIS, CHRONIC 08/29/2009  . NEPHROLITHIASIS 08/29/2009  . FIBROMYALGIA 08/29/2009  . BICEPS TENDON RUPTURE, RIGHT 08/11/2009  . DIABETES MELLITUS, TYPE II 08/05/2009  . Hyperlipidemia 08/05/2009  . ANEMIA-NOS 08/05/2009  . ANXIETY 08/05/2009  . DEPRESSION 08/05/2009  . Chronic pain syndrome 08/05/2009  . Essential hypertension 08/05/2009  . GERD 08/05/2009  . BENIGN PROSTATIC HYPERTROPHY 08/05/2009  . [place] DISEASE, LUMBAR 08/05/2009  . Osteoporosis 08/05/2009   Past Medical History:  Diagnosis Date  . Anemia    NOS  . Anxiety   . 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 BPH (benign prostatic hypertrophy)   . Broken foot Oct. 12, 2013   Right foot Fx  . CAD (coronary artery disease)   . Carotid stenosis, left   . Cellulitis and abscess of leg 03/2018   right leg  . Chronic hyponatremia   . Chronic pain syndrome   . COPD (chronic obstructive pulmonary disease) ([place])   . Depression   . Diabetes mellitus   . Diabetic foot ulcer ([place])   . DJD (degenerative joint disease)    L wrist  . DM2 (diabetes mellitus, type 2) ([place])   . GERD (gastroesophageal reflux disease)   . HLD (hyperlipidemia)   . HTN (hypertension)   . Hyponatremia   .  Lumbar disc disease   . Osteoporosis    tx per [facility], Dr Prudencio Burly, thought due to heavy steriod use after 1978  . Peripheral neuropathy   . Peripheral vascular disease ([place])   . Spine fracture    hx, multiple  . Varicose veins   . 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 Vitamin D deficiency     Family History  Problem Relation Age of Onset  . Lung cancer Father 34  . Diabetes Mother 77  . Osteoporosis Mother   . Depression Mother   . Bleeding Disorder Mother   . Throat cancer Unknown        family hx - "bad living" - also lung CA, heart disease and renal failure    . Diabetes Sister   .  Suicidality Son 24  . Lung cancer Maternal Grandmother 13  . Diabetes Maternal Grandfather 71  . Heart disease Maternal Grandfather   . Heart attack Paternal Grandmother   . Brain cancer Paternal Grandfather 50    Past Surgical History:  Procedure Laterality Date  . ANGIOPLASTY    . aorta bifemoral bypass grafting  09/2010  . CARDIAC CATHETERIZATION N/A 09/19/2015   Procedure: Left Heart Cath and Coronary Angiography;  Surgeon: Adrian Prows, MD;  Location: [facility];  Service: Cardiovascular;  Laterality: N/A;  . CARPAL TUNNEL RELEASE     right 2006/ left 2007  . CATARACT EXTRACTION     left 1996/ right 1997  . CORONARY ARTERY BYPASS GRAFT N/A 09/22/2015   Procedure: CORONARY ARTERY BYPASS GRAFTING (CABG) times four using the right greater saphenous vein harvested endoscopically and the left internal mammary artery.  LIMA-LAD, SEQ SVG-DIAG & OM, SVG-PD.;  Surgeon: [name], MD;  Location: [facility];  Service: Open Heart Surgery;  Laterality: N/A;  . I&D EXTREMITY Right 04/07/2018   Procedure: IRRIGATION AND DEBRIDEMENT ABSCESS RIGHT LEG;  Surgeon: Newt Minion, MD;  Location: [facility];  Service: Orthopedics;  Laterality: Right;  . L foot open repair jones fracture  2010    5th metetarsal   . left ankle ganglion cyst  1976  . left carotid endarterectomy  03/2011  . left  cataract  1996   right - 1997  . left CTS  2006   R CTS - 2007  . left foot surgery  1998   R surgery/fracture - 1999  . left plantar ganglion cystectomy  1979  . left wrist/hand fusion  2008  . [place], 2004  . NASAL SINUS SURGERY     multiple- x8. last 1997 with obliteration  . REPAIR THORACIC AORTA  2017  . right hand fracture  1969  . ROTATOR CUFF REPAIR  2006   R, than repeat 2011, Dr Theda Sers  . TRIGGER FINGER RELEASE Left 11/11/2014   Procedure: LEFT LONG FINGER RELEASE TRIGGER FINGER/A-1 PULLEY;  Surgeon: Milly Jakob, MD;  Location: [facility];  Service: Orthopedics;  Laterality: Left;  . vocal surgery  1996   Social History   Occupational History  . Not on file  Tobacco Use  . Smoking status: Former Smoker    Packs/day: 1.50    Years: 30.00    Pack years: 45.00    Types: Cigarettes    Last attempt to quit: 11/06/1988    Years since quitting: 29.7  . Smokeless tobacco: Never Used  Substance and Sexual Activity  . Alcohol use: Not Currently    Comment: None in 30 years but drank beer before  . Drug use: No  . Sexual activity: Not Currently

## 2018-07-17 NOTE — Patient Outreach (Signed)
[facility]) Care Management  07/17/2018  [name] [birth year] [id number]    RN attempted outreach call to both home and cell however unable to reach pt today. RN able to leave a HIPAA approved voice message to pt's cell contact. Also send outreach letter to pt today. Will allow pt time to respond and follow up with another call within the next week.   [name], RN Care Management Coordinator [place] Office [phone]

## 2018-07-18 ENCOUNTER — Telehealth (INDEPENDENT_AMBULATORY_CARE_PROVIDER_SITE_OTHER): Payer: Self-pay | Admitting: Orthopedic Surgery

## 2018-07-18 NOTE — Telephone Encounter (Signed)
We gave this pt Prisma dressing supplies yesterday and he states that he never received them. Is calling and asking for more supply. What do you want to do

## 2018-07-18 NOTE — Telephone Encounter (Signed)
Call in silvadine, tell him we are out of samples

## 2018-07-18 NOTE — Telephone Encounter (Signed)
Patient called statig needs dressing for wound. He was given some and left it here.  Please call patient to advise.

## 2018-07-19 ENCOUNTER — Other Ambulatory Visit (INDEPENDENT_AMBULATORY_CARE_PROVIDER_SITE_OTHER): Payer: Self-pay

## 2018-07-19 ENCOUNTER — Ambulatory Visit (INDEPENDENT_AMBULATORY_CARE_PROVIDER_SITE_OTHER): Payer: PRIVATE HEALTH INSURANCE | Admitting: Nurse Practitioner

## 2018-07-19 ENCOUNTER — Encounter: Payer: Self-pay | Admitting: Nurse Practitioner

## 2018-07-19 VITALS — BP 120/62 | HR 74 | Temp 98.0°F | Ht 72.0 in | Wt 204.0 lb

## 2018-07-19 DIAGNOSIS — G894 Chronic pain syndrome: Secondary | ICD-10-CM

## 2018-07-19 DIAGNOSIS — E663 Overweight: Secondary | ICD-10-CM | POA: Diagnosis not present

## 2018-07-19 DIAGNOSIS — M8000XS Age-related osteoporosis with current pathological fracture, unspecified site, sequela: Secondary | ICD-10-CM | POA: Diagnosis not present

## 2018-07-19 DIAGNOSIS — I10 Essential (primary) hypertension: Secondary | ICD-10-CM | POA: Diagnosis not present

## 2018-07-19 MED ORDER — LOSARTAN POTASSIUM 25 MG PO TABS: 25.0000 mg | ORAL_TABLET | Freq: Every day | ORAL | 1 refills | Status: DC

## 2018-07-19 MED ORDER — SILVER SULFADIAZINE 1 % EX CREA: 1.0000 "application " | TOPICAL_CREAM | Freq: Every day | CUTANEOUS | 0 refills | Status: DC

## 2018-07-19 NOTE — Progress Notes (Signed)
Careteam: Patient Care Team: [name], NP as PCP - General (Geriatric Medicine) Serafina Mitchell, MD as Consulting Physician (Vascular Surgery) Adrian Prows, MD as Consulting Physician (Cardiology) Jana Half, DPM as Consulting Physician (Podiatry) Newt Minion, MD as Consulting Physician (Orthopedic Surgery) Kriste Basque, MD as Referring Physician (Pain Medicine) Tobi Bastos, RN as [place] Management  Advanced Directive information    Allergies  Allergen Reactions  . Ivp Dye [Iodinated Diagnostic Agents]     Blood Pressure dropped----pt was pre-medicated with 13 hour prep and did fine with pre-meds--amy 03/09/11   . Penicillins Nausea Only    REACTION: stomach pain(IV ok) Has patient had a PCN reaction causing immediate rash, facial/tongue/throat swelling, SOB or lightheadedness with hypotension: No Has patient had a PCN reaction causing severe rash involving mucus membranes or skin necrosis: No Has patient had a PCN reaction that required hospitalization: No Has patient had a PCN reaction occurring within the last 10 years: No If all of the above answers are "NO", then may proceed with Cephalosporin use.   . Sulfa Antibiotics Hives  . Sulfonamide Derivatives Hives and Itching  . Lisinopril Cough  . Statins Other (See Comments)    myalgias  . Propoxyphene N-Acetaminophen Other (See Comments)    Sharp pains- headache    Chief Complaint  Patient presents with  . Follow-up    Blood pressure follow-up and discuss bone density results   . Medication Refill    Refill Losartan   . Health Maintenance    Refused Colonoscopy      HPI: Patient is a 72 y.o. male seen in the office today to follow up blood pressure.  At last OV he had stopped taking blood pressure medications due to being dizzy however blood pressure started to rise and he resumed medication.  He is taking losartan 25 mg by mouth daily. States that he "figured out  something his neighbors are doing is making him dizzy"  Currently not dizzy.   Pt with hx of osteoporosis- previously on reclast for 2 years and now taking vit D, unable to tolerate calcium. No exercises at this time.   Ongoing follow up with Dr Sharol Given due to nonhealing diabetic ulcers of bilateral heels and following his recommendations at home.   Review of Systems:  Review of Systems  Constitutional: Negative for chills and fever.  Respiratory: Negative for shortness of breath.   Cardiovascular: Positive for leg swelling. Negative for chest pain.  Musculoskeletal: Positive for joint pain and myalgias.  Neurological: Negative for dizziness and headaches.    Past Medical History:  Diagnosis Date  . Anemia    NOS  . Anxiety   . 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 BPH (benign prostatic hypertrophy)   . Broken foot Oct. 12, 2013   Right foot Fx  . CAD (coronary artery disease)   . Carotid stenosis, left   . Cellulitis and abscess of leg 03/2018   right leg  . Chronic hyponatremia   . Chronic pain syndrome   . COPD (chronic obstructive pulmonary disease) ([place])   . Depression   . Diabetes mellitus   . Diabetic foot ulcer ([place])   . DJD (degenerative joint disease)    L wrist  . DM2 (diabetes mellitus, type 2) ([place])   . GERD (gastroesophageal reflux disease)   . HLD (hyperlipidemia)   . HTN (hypertension)   . Hyponatremia   .  Lumbar disc disease   . Osteoporosis    tx per [facility], Dr Prudencio Burly, thought due to heavy steriod use after 1978  . Peripheral neuropathy   . Peripheral vascular disease ([place])   . Spine fracture    hx, multiple  . Varicose veins   . 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 Vitamin D deficiency    Past Surgical History:  Procedure Laterality Date  . ANGIOPLASTY    . aorta bifemoral bypass grafting  09/2010  . CARDIAC CATHETERIZATION N/A 09/19/2015   Procedure: Left Heart Cath and Coronary Angiography;  Surgeon: Adrian Prows, MD;  Location: [facility];  Service: Cardiovascular;  Laterality: N/A;  . CARPAL TUNNEL RELEASE     right 2006/ left 2007  . CATARACT EXTRACTION     left 1996/ right 1997  . CORONARY ARTERY BYPASS GRAFT N/A 09/22/2015   Procedure: CORONARY ARTERY BYPASS GRAFTING (CABG) times four using the right greater saphenous vein harvested endoscopically and the left internal mammary artery.  LIMA-LAD, SEQ SVG-DIAG & OM, SVG-PD.;  Surgeon: [name], MD;  Location: [facility];  Service: Open Heart Surgery;  Laterality: N/A;  . I&D EXTREMITY Right 04/07/2018   Procedure: IRRIGATION AND DEBRIDEMENT ABSCESS RIGHT LEG;  Surgeon: Newt Minion, MD;  Location: [facility];  Service: Orthopedics;  Laterality: Right;  . L foot open repair jones fracture  2010    5th metetarsal   . left ankle ganglion cyst  1976  . left carotid endarterectomy  03/2011  . left cataract  1996   right - 1997  . left CTS  2006   R CTS - 2007  . left foot surgery  1998   R surgery/fracture - 1999  . left plantar ganglion cystectomy  1979  . left wrist/hand fusion  2008  . [place], 2004  . NASAL SINUS SURGERY     multiple- x8. last 1997 with obliteration  . REPAIR THORACIC AORTA  2017  . right hand fracture  1969  . ROTATOR CUFF REPAIR  2006   R, than repeat 2011, Dr Theda Sers  . TRIGGER FINGER RELEASE Left 11/11/2014   Procedure: LEFT LONG FINGER RELEASE TRIGGER FINGER/A-1 PULLEY;  Surgeon: Milly Jakob, MD;  Location: [facility];  Service: Orthopedics;  Laterality: Left;  . vocal surgery  1996   Social History:   reports that he quit smoking about 29 years ago. His smoking use included cigarettes. He has a 45.00 pack-year smoking history. He has never used smokeless tobacco. He reports that he drank alcohol. He reports that he does not use drugs.  Family History  Problem Relation Age of Onset  . Lung cancer Father 12  . Diabetes Mother 42  . Osteoporosis Mother   . Depression  Mother   . Bleeding Disorder Mother   . Throat cancer Unknown        family hx - "bad living" - also lung CA, heart disease and renal failure    . Diabetes Sister   . Suicidality Son 48  . Lung cancer Maternal Grandmother 78  . Diabetes Maternal Grandfather 83  . Heart disease Maternal Grandfather   . Heart attack Paternal Grandmother   . Brain cancer Paternal Grandfather 74    Medications: Patient's Medications  New Prescriptions   No medications on file  Previous Medications   ACCU-CHEK FASTCLIX LANCETS MISC    Use as directed to check blood glucose. Dx:  E11.51   ALBUTEROL (PROAIR HFA) 108 (90 BASE) MCG/ACT INHALER    Inhale 2 puffs into the lungs every 6 (six) hours as needed for wheezing or shortness of breath.   BLOOD GLUCOSE MONITORING SUPPL (ACCU-CHEK AVIVA PLUS) W/DEVICE KIT    Use as directed to check blood glucose daily. Dx: E11.51   BUPROPION (WELLBUTRIN XL) 150 MG 24 HR TABLET    Take 3 (450 mg) each morning   CELECOXIB (CELEBREX) 200 MG CAPSULE    Take 200 mg by mouth 2 (two) times daily.   CHOLECALCIFEROL (VITAMIN D3) 5000 UNITS TABS    Take 5,000 Units by mouth daily.   CLONAZEPAM (KLONOPIN) 0.5 MG TABLET    Take 1 tablet (0.5 mg total) by mouth 2 (two) times daily as needed for anxiety.   CLOPIDOGREL (PLAVIX) 75 MG TABLET    Take 75 mg by mouth daily.   CYCLOBENZAPRINE (FLEXERIL) 10 MG TABLET    Take 10 mg by mouth 3 (three) times daily as needed for muscle spasms.    DESONIDE (DESOWEN) 0.05 % CREAM    Apply 1 application topically daily as needed (rosacea).    FENTANYL (DURAGESIC - DOSED MCG/HR) 50 MCG/HR    Place 50 mcg onto the skin every 3 (three) days.   FUROSEMIDE (LASIX) 20 MG TABLET    Take 1 tablet (20 mg total) by mouth daily.   GABAPENTIN (NEURONTIN) 400 MG CAPSULE    Take 800 mg by mouth daily. Take 1 (400 mg) each morning & 2 (800 mg) each evening   GLUCOSE BLOOD (ACCU-CHEK AVIVA PLUS) TEST STRIP    1 each by Other route as needed for other. Use as  instructed. Dx: E11.51   IRON PO    Take 1 capsule by mouth daily.   JANUVIA 100 MG TABLET    TAKE 1 TABLET BY MOUTH EVERY DAY   LOSARTAN (COZAAR) 25 MG TABLET    TAKE 1 TABLET BY MOUTH EVERY DAY   MULTIPLE VITAMIN (MULTIVITAMIN WITH MINERALS) TABS TABLET    Take 1 tablet by mouth daily.   NITROGLYCERIN (NITROSTAT) 0.4 MG SL TABLET    Place 0.4 mg under the tongue every 5 (five) minutes as needed for chest pain.   OMEPRAZOLE (PRILOSEC) 40 MG CAPSULE    Take one capsule by mouth twice daily   POTASSIUM CHLORIDE (K-DUR) 10 MEQ TABLET    Take 1 tablet (10 mEq total) by mouth daily.   ROPINIROLE (REQUIP) 1 MG TABLET    Take 2 mg by mouth at bedtime.   SILVER SULFADIAZINE (SILVADENE) 1 % CREAM    Apply 1 application topically daily. Apply to affected area daily   TAMSULOSIN (FLOMAX) 0.4 MG CAPS CAPSULE    Take 0.4 mg by mouth 2 (two) times daily.   ZONISAMIDE (ZONEGRAN) 50 MG CAPSULE    TAKE 3 CAPSULES(150 MG) BY MOUTH AT BEDTIME  Modified Medications   No medications on file  Discontinued Medications   MINERAL OIL-HYDROPHILIC PETROLATUM (AQUAPHOR) OINTMENT    Apply topically 2 (two) times daily. Apply to lower extremities twice daily for dry skin until resolved.   TAMSULOSIN (FLOMAX) 0.4 MG CAPS CAPSULE    Take 1 capsule (0.4 mg total) by mouth daily.     Physical Exam:  Vitals:   07/19/18 1315  BP: 120/62  Pulse: 74  Temp: 98 F (36.7 C)  TempSrc: Oral  SpO2: 94%  Weight: 204 lb (92.5 kg)  Height: 6' (1.829 m)   Body mass  index is 27.67 kg/m.  Physical Exam  Constitutional: He is oriented to person, place, and time. He appears well-developed and well-nourished. No distress.  HENT:  Head: Normocephalic.  Nose: Nose normal.  Mouth/Throat: No oropharyngeal exudate.  Eyes: Pupils are equal, round, and reactive to light.  Cardiovascular: Normal rate, regular rhythm, normal heart sounds and intact distal pulses. Exam reveals no gallop and no friction rub.  No murmur  heard. Pulmonary/Chest: Effort normal and breath sounds normal. No respiratory distress. He has no wheezes. He has no rales.  Musculoskeletal: He exhibits edema (3+ pitting). He exhibits no tenderness.  Unsteady gait ambulates with right hand cane Right leg edema knee high hose in place.   Neurological: He is oriented to person, place, and time. He has normal strength. Gait abnormal.  Skin: Skin is warm and dry.  Psychiatric: He has a normal mood and affect. His speech is normal and behavior is normal. Judgment and thought content normal.    Labs reviewed: Basic Metabolic Panel: Recent Labs    04/13/18 1356 04/28/18 1948 06/28/18 1525  NA 128* 133* 132*  K 4.6 3.6 4.7  CL 91* 97* 97*  CO2 _0 GLUCOSE 128* 141* 100  BUN _1 CREATININE 0.93 0.97 0.86  CALCIUM 9.2 9.0 9.0   Liver Function Tests: Recent Labs    12/01/17  04/01/18 1359 04/02/18 0252 04/13/18 1356 06/28/18 1525  AST 23   < > _2 ALT 15   < > _3 ALKPHOS 70  --  95 81  --   --   BILITOT  --    < > 0.7 0.8 0.4 0.3  PROT  --    < > 7.2 6.2* 7.2 6.9  ALBUMIN  --   --  3.1* 2.6*  --   --    < > = values in this interval not displayed.   No results for input(s): LIPASE, AMYLASE in the last 8760 hours. No results for input(s): AMMONIA in the last 8760 hours. CBC: Recent Labs    04/13/18 1356 04/28/18 1948 06/28/18 1525  WBC 6.3 4.4 5.9  NEUTROABS 3,692 2.6 3,929  HGB 11.7* 11.8* 12.7*  HCT 35.0* 36.6* 37.4*  MCV 90.9 95.3 88.8  PLT 419* 183 280   Lipid Panel: Recent Labs    12/01/17 03/21/18 0810  CHOL 140 139  HDL 50 37*  LDLCALC 79 86  TRIG 57 71  CHOLHDL  --  3.8   TSH: No results for input(s): TSH in the last 8760 hours. A1C: Lab Results  Component Value Date   HGBA1C 6.5 (H) 06/28/2018     Assessment/Plan 1. Essential hypertension -stable, restarted losartan daily and now needs refill. Blood pressure in acceptable range. - losartan (COZAAR) 25 MG  tablet; Take 1 tablet (25 mg total) by mouth daily.  Dispense: 90 tablet; Refill: 1  2. Overweight with body mass index (BMI) 25.0-29.9) Noted today, pt is limited on activity due to chronic pain and bilateral heel ulcers.  3. Chronic pain syndrome Records from pain clinic received and reviewed.   4. Osteoporosis with current pathological fracture, unspecified osteoporosis type, sequela -currently noted to have osteoporosis on recent dexa scan. Continues on vit D at this time.   Next appt: 3 months, sooner as needed  K. Yell, [facility] [phone]

## 2018-07-19 NOTE — Patient Instructions (Signed)
Fat and Cholesterol Restricted Diet Getting too much fat and cholesterol in your diet may cause health problems. Following this diet helps keep your fat and cholesterol at normal levels. This can keep you from getting sick. What types of fat should I choose?  Choose monosaturated and polyunsaturated fats. These are found in foods such as olive oil, canola oil, flaxseeds, walnuts, almonds, and seeds.  Eat more omega-3 fats. Good choices include salmon, mackerel, sardines, tuna, flaxseed oil, and ground flaxseeds.  Limit saturated fats. These are in animal products such as meats, butter, and cream. They can also be in plant products such as palm oil, palm kernel oil, and coconut oil.  Avoid foods with partially hydrogenated oils in them. These contain trans fats. Examples of foods that have trans fats are stick margarine, some tub margarines, cookies, crackers, and other baked goods. What general guidelines do I need to follow?  Check food labels. Look for the words "trans fat" and "saturated fat."  When preparing a meal: ? Fill half of your plate with vegetables and green salads. ? Fill one fourth of your plate with whole grains. Look for the word "whole" as the first word in the ingredient list. ? Fill one fourth of your plate with lean protein foods.  Eat more foods that have fiber, like apples, carrots, beans, peas, and barley.  Eat more home-cooked foods. Eat less at restaurants and buffets.  Limit or avoid alcohol.  Limit foods high in starch and sugar.  Limit fried foods.  Cook foods without frying them. Baking, boiling, grilling, and broiling are all great options.  Lose weight if you are overweight. Losing even a small amount of weight can help your overall health. It can also help prevent diseases such as diabetes and heart disease. What foods can I eat? Grains Whole grains, such as whole wheat or whole grain breads, crackers, cereals, and pasta. Unsweetened oatmeal,  bulgur, barley, quinoa, or brown rice. Corn or whole wheat flour tortillas. Vegetables Fresh or frozen vegetables (raw, steamed, roasted, or grilled). Green salads. Fruits All fresh, canned (in natural juice), or frozen fruits. Meat and Other Protein Products Ground beef (85% or leaner), grass-fed beef, or beef trimmed of fat. Skinless chicken or turkey. Ground chicken or turkey. Pork trimmed of fat. All fish and seafood. Eggs. Dried beans, peas, or lentils. Unsalted nuts or seeds. Unsalted canned or dry beans. Dairy Low-fat dairy products, such as skim or 1% milk, 2% or reduced-fat cheeses, low-fat ricotta or cottage cheese, or plain low-fat yogurt. Fats and Oils Tub margarines without trans fats. Light or reduced-fat mayonnaise and salad dressings. Avocado. Olive, canola, sesame, or safflower oils. Natural peanut or almond butter (choose ones without added sugar and oil). The items listed above may not be a complete list of recommended foods or beverages. Contact your dietitian for more options. What foods are not recommended? Grains White bread. White pasta. White rice. Cornbread. Bagels, pastries, and croissants. Crackers that contain trans fat. Vegetables White potatoes. Corn. Creamed or fried vegetables. Vegetables in a cheese sauce. Fruits Dried fruits. Canned fruit in light or heavy syrup. Fruit juice. Meat and Other Protein Products Fatty cuts of meat. Ribs, chicken wings, bacon, sausage, bologna, salami, chitterlings, fatback, hot dogs, bratwurst, and packaged luncheon meats. Liver and organ meats. Dairy Whole or 2% milk, cream, half-and-half, and cream cheese. Whole milk cheeses. Whole-fat or sweetened yogurt. Full-fat cheeses. Nondairy creamers and whipped toppings. Processed cheese, cheese spreads, or cheese curds. Sweets and Desserts Corn   syrup, sugars, honey, and molasses. Candy. Jam and jelly. Syrup. Sweetened cereals. Cookies, pies, cakes, donuts, muffins, and ice  cream. Fats and Oils Butter, stick margarine, lard, shortening, ghee, or bacon fat. Coconut, palm kernel, or palm oils. Beverages Alcohol. Sweetened drinks (such as sodas, lemonade, and fruit drinks or punches). The items listed above may not be a complete list of foods and beverages to avoid. Contact your dietitian for more information. This information is not intended to replace advice given to you by your health care provider. Make sure you discuss any questions you have with your health care provider. Document Released: 02/01/2012 Document Revised: 04/08/2016 Document Reviewed: 11/01/2013 Elsevier Interactive Patient Education  2018 Elsevier Inc.  

## 2018-07-19 NOTE — Telephone Encounter (Signed)
I called and lm n vm for pt to advise of message below. The pt's pharmacy is a mail order pharm and so I asked for him to call back with the name of a local pharm so I can send this rx in for him and he can begin daily dressing changes. Will hold message pending pt's return call.

## 2018-07-24 ENCOUNTER — Other Ambulatory Visit: Payer: Self-pay | Admitting: *Deleted

## 2018-07-24 NOTE — Patient Outreach (Signed)
[facility]) Care Management  07/24/2018  [name] 02-23-1946 [id number]  Case Closure  RN spoke with pt today with update on his management of care. RN verified pt has sufficient transportation and recently completed the [employer] application. States the agency called him and requested additional information from his provider (Dr. Sharol Given). RN offered social worker to intervene for further assistance (pt decline). Currently pt has sufficient transportation until the process is completed from available friends/family. RN also inquired on his recent cellulitis LE. This was managed by a [place] agency. Pt states the agency is no longer providing wound care. RN inquired if he is continue to manage his wound. RN strongly encouraged pt to manage is wounds and report any changes to his wounds to his provider's office. RN verified pt has completed the prescribed antibotics since the last conversation. Reports he is taking his medications as prescribed. RN inquired on any needed resources at this time and reviewed pt's current plan of care. Pt states no needs and as discussed all goals have been met with no acute symptoms or issues at this time. Case closure discussed as pt agreed with no additional needs. Provider's office will be notified that pt is graduating via [facility] program and services.   RN verified pt has contact name and number via [facility] services if needed in the future.   [name], RN Care Management Coordinator [place] Office [phone]

## 2018-07-27 ENCOUNTER — Ambulatory Visit: Payer: Self-pay | Admitting: *Deleted

## 2018-07-27 ENCOUNTER — Ambulatory Visit: Payer: PRIVATE HEALTH INSURANCE | Admitting: Family Medicine

## 2018-08-04 ENCOUNTER — Telehealth (INDEPENDENT_AMBULATORY_CARE_PROVIDER_SITE_OTHER): Payer: Self-pay | Admitting: Orthopedic Surgery

## 2018-08-04 NOTE — Telephone Encounter (Signed)
[name] retirement firm  [phone] ext [phone]   Please call to discuss patient care

## 2018-08-05 ENCOUNTER — Other Ambulatory Visit: Payer: Self-pay | Admitting: Physician Assistant
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
## 2018-08-11 ENCOUNTER — Other Ambulatory Visit: Payer: Self-pay | Admitting: *Deleted

## 2018-08-11 NOTE — Telephone Encounter (Signed)
Patient requested refill. Pended and sent to [facility] for Approval due to [place].

## 2018-08-14 ENCOUNTER — Ambulatory Visit (INDEPENDENT_AMBULATORY_CARE_PROVIDER_SITE_OTHER): Payer: Self-pay | Admitting: Orthopedic Surgery

## 2018-08-17 ENCOUNTER — Ambulatory Visit (INDEPENDENT_AMBULATORY_CARE_PROVIDER_SITE_OTHER): Payer: PRIVATE HEALTH INSURANCE | Admitting: Physician Assistant

## 2018-08-17 ENCOUNTER — Encounter: Payer: Self-pay | Admitting: Physician Assistant

## 2018-08-17 DIAGNOSIS — F411 Generalized anxiety disorder: Secondary | ICD-10-CM | POA: Diagnosis not present

## 2018-08-17 DIAGNOSIS — F331 Major depressive disorder, recurrent, moderate: Secondary | ICD-10-CM

## 2018-08-17 DIAGNOSIS — G2581 Restless legs syndrome: Secondary | ICD-10-CM | POA: Diagnosis not present

## 2018-08-17 DIAGNOSIS — F4310 Post-traumatic stress disorder, unspecified: Secondary | ICD-10-CM

## 2018-08-17 MED ORDER — CLONAZEPAM 0.5 MG PO TABS: 0.5000 mg | ORAL_TABLET | Freq: Two times a day (BID) | ORAL | 5 refills | Status: DC | PRN

## 2018-08-17 MED ORDER — GABAPENTIN 400 MG PO CAPS: ORAL_CAPSULE | ORAL | 1 refills | Status: DC

## 2018-08-17 MED ORDER — ROPINIROLE HCL 1 MG PO TABS: 2.0000 mg | ORAL_TABLET | Freq: Every day | ORAL | 1 refills | Status: DC

## 2018-08-17 MED ORDER — BUPROPION HCL ER (XL) 150 MG PO TB24: 450.0000 mg | ORAL_TABLET | Freq: Every day | ORAL | 1 refills | Status: DC

## 2018-08-17 NOTE — Progress Notes (Signed)
[facility] Med Check  Patient ID: [name],  MRN: [id number]  PCP: [name], NP  Date of Evaluation: 08/17/2018 Time spent:15 minutes  Chief Complaint:  Chief Complaint    Follow-up; Anxiety; Depression; Post-Traumatic Stress Disorder      HISTORY/CURRENT STATUS: HPI Here for routine med check.   Doing "pretty good."  Patient denies loss of interest in usual activities and is able to enjoy things.  Denies decreased energy or motivation.  Appetite has not changed.  No extreme sadness, tearfulness, or feelings of hopelessness.  Denies any changes in concentration, making decisions or remembering things.  Denies suicidal or homicidal thoughts.  States he sleeps fairly well.  The restless leg syndrome is not giving him any more trouble than usual.  He is having a problem with his foot which is a chronic issue and he will be seeing an orthopedist tomorrow.  He has to walk with a cane to help balance to keep him from falling.  Patient denies increased energy with decreased need for sleep, no increased talkativeness, no racing thoughts, no impulsivity or risky behaviors, no increased spending, no increased libido, no grandiosity. Individual Medical History/ Review of Systems: Changes? :No    Past medications for mental health diagnoses include: Wellbutrin XL, Risperdal, Prozac, Zoloft, Paxil, Tofranil, gabapentin, ropinirole, Klonopin  Allergies: Ivp dye [iodinated diagnostic agents]; Penicillins; Sulfa antibiotics; Sulfonamide derivatives; Lisinopril; Statins; and Propoxyphene n-acetaminophen  Current Medications:  Current Outpatient Medications:  .  ACCU-CHEK FASTCLIX LANCETS MISC, Use as directed to check blood glucose. Dx: E11.51, Disp: 102 each, Rfl: 6 .  albuterol (PROAIR HFA) 108 (90 Base) MCG/ACT inhaler, Inhale 2 puffs into the lungs every 6 (six) hours as needed for wheezing or shortness of breath., Disp: , Rfl:  .  Blood Glucose Monitoring Suppl (ACCU-CHEK  AVIVA PLUS) w/Device KIT, Use as directed to check blood glucose daily. Dx: E11.51, Disp: 1 kit, Rfl: 0 .  buPROPion (WELLBUTRIN XL) 150 MG 24 hr tablet, Take 3 tablets (450 mg total) by mouth daily. Take 3 (450 mg) each morning, Disp: 270 tablet, Rfl: 1 .  celecoxib (CELEBREX) 200 MG capsule, Take 200 mg by mouth 2 (two) times daily., Disp: , Rfl:  .  Cholecalciferol (VITAMIN D3) 5000 units TABS, Take 5,000 Units by mouth daily., Disp: , Rfl:  .  clonazePAM (KLONOPIN) 0.5 MG tablet, Take 1 tablet (0.5 mg total) by mouth 2 (two) times daily as needed for anxiety., Disp: 60 tablet, Rfl: 5 .  clopidogrel (PLAVIX) 75 MG tablet, Take 75 mg by mouth daily., Disp: , Rfl:  .  desonide (DESOWEN) 0.05 % cream, Apply 1 application topically daily as needed (rosacea). , Disp: , Rfl:  .  fentaNYL (DURAGESIC - DOSED MCG/HR) 50 MCG/HR, Place 50 mcg onto the skin every 3 (three) days., Disp: , Rfl:  .  furosemide (LASIX) 20 MG tablet, Take 1 tablet (20 mg total) by mouth daily., Disp: 30 tablet, Rfl:  .  gabapentin (NEURONTIN) 400 MG capsule, 1 q am, 2 qhs, Disp: 270 capsule, Rfl: 1 .  glucose blood (ACCU-CHEK AVIVA PLUS) test strip, 1 each by Other route as needed for other. Use as instructed. Dx: E11.51, Disp: 100 each, Rfl: 6 .  JANUVIA 100 MG tablet, TAKE 1 TABLET BY MOUTH EVERY DAY, Disp: 90 tablet, Rfl: 1 .  losartan (COZAAR) 25 MG tablet, Take 1 tablet (25 mg total) by mouth daily., Disp: 90 tablet, Rfl: 1 .  Multiple Vitamin (MULTIVITAMIN WITH MINERALS) TABS tablet,  Take 1 tablet by mouth daily., Disp: , Rfl:  .  nitroGLYCERIN (NITROSTAT) 0.4 MG SL tablet, Place 0.4 mg under the tongue every 5 (five) minutes as needed for chest pain., Disp: , Rfl:  .  omeprazole (PRILOSEC) 40 MG capsule, Take one capsule by mouth twice daily, Disp: 60 capsule, Rfl: 4 .  potassium chloride (K-DUR) 10 MEQ tablet, Take 1 tablet (10 mEq total) by mouth daily., Disp: , Rfl:  .  rOPINIRole (REQUIP) 1 MG tablet, Take 2 tablets (2  mg total) by mouth at bedtime., Disp: 180 tablet, Rfl: 1 .  tamsulosin (FLOMAX) 0.4 MG CAPS capsule, Take 0.4 mg by mouth 2 (two) times daily., Disp: , Rfl:  .  zaleplon (SONATA) 10 MG capsule, Take 10 mg by mouth at bedtime as needed for sleep. May repeat for MNA if has 3 hours left to sleep., Disp: , Rfl:  .  zonisamide (ZONEGRAN) 50 MG capsule, TAKE 3 CAPSULES(150 MG) BY MOUTH AT BEDTIME, Disp: 270 capsule, Rfl: 1 .  cyclobenzaprine (FLEXERIL) 10 MG tablet, Take 10 mg by mouth 3 (three) times daily as needed for muscle spasms. , Disp: , Rfl: 0 .  IRON PO, Take 1 capsule by mouth daily., Disp: , Rfl:  .  silver sulfADIAZINE (SILVADENE) 1 % cream, Apply 1 application topically daily. Apply to affected area daily (Patient not taking: Reported on 08/17/2018), Disp: 450 g, Rfl: 0 Medication Side Effects: none  Family Medical/ Social History: Changes? No  MENTAL HEALTH EXAM:  There were no vitals taken for this visit.There is no height or weight on file to calculate BMI.  General Appearance: Casual and Well Groomed  Eye Contact:  Good  Speech:  Clear and Coherent  Volume:  Normal  Mood:  Euthymic  Affect:  Appropriate  Thought Process:  Goal Directed  Orientation:  Full (Time, Place, and Person)  Thought Content: Logical he is not talking about the FBI being in his house messing up things.  Suicidal Thoughts:  No  Homicidal Thoughts:  No  Memory:  WNL  Judgement:  Good  Insight:  Good  Psychomotor Activity:  Normal he walks with a walking cane  Concentration:  Concentration: Good  Recall:  Good  Fund of Knowledge: Good  Language: Good  Assets:  Desire for Improvement  ADL's:  Intact  Cognition: WNL  Prognosis:  Good    DIAGNOSES:    ICD-10-CM   1. Major depressive disorder, recurrent episode, moderate (HCC) F33.1   2. PTSD (post-traumatic stress disorder) F43.10   3. RLS (restless legs syndrome) G25.81   4. Generalized anxiety disorder F41.1     Receiving Psychotherapy: No     RECOMMENDATIONS: Continue all medications as noted above. We again discussed his age and the fact that he is on a benzodiazepine as well as Sonata, along with opioids.  He understands the risks and is willing to take those risk.  I agree that the benefits outweigh the risks.  We have tried to wean him off of the Klonopin and have not been successful.  At this point we will make no changes. Return in 6 weeks or sooner as needed.   Donnal Moat, PA-C

## 2018-08-18 ENCOUNTER — Encounter (INDEPENDENT_AMBULATORY_CARE_PROVIDER_SITE_OTHER): Payer: Self-pay | Admitting: Physician Assistant

## 2018-08-18 ENCOUNTER — Ambulatory Visit (INDEPENDENT_AMBULATORY_CARE_PROVIDER_SITE_OTHER): Payer: PRIVATE HEALTH INSURANCE | Admitting: Physician Assistant

## 2018-08-18 VITALS — Ht 72.0 in | Wt 204.0 lb

## 2018-08-18 DIAGNOSIS — E1151 Type 2 diabetes mellitus with diabetic peripheral angiopathy without gangrene: Secondary | ICD-10-CM

## 2018-08-18 DIAGNOSIS — I87311 Chronic venous hypertension (idiopathic) with ulcer of right lower extremity: Secondary | ICD-10-CM | POA: Diagnosis not present

## 2018-08-18 DIAGNOSIS — L97919 Non-pressure chronic ulcer of unspecified part of right lower leg with unspecified severity: Secondary | ICD-10-CM

## 2018-08-18 DIAGNOSIS — E440 Moderate protein-calorie malnutrition: Secondary | ICD-10-CM | POA: Diagnosis not present

## 2018-08-18 DIAGNOSIS — E08621 Diabetes mellitus due to underlying condition with foot ulcer: Secondary | ICD-10-CM | POA: Diagnosis not present

## 2018-08-18 DIAGNOSIS — L97421 Non-pressure chronic ulcer of left heel and midfoot limited to breakdown of skin: Secondary | ICD-10-CM
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
## 2018-08-21 ENCOUNTER — Telehealth (INDEPENDENT_AMBULATORY_CARE_PROVIDER_SITE_OTHER): Payer: Self-pay
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
## 2018-08-21 NOTE — Telephone Encounter (Signed)
Simmie Davies, Education officer, museum with [employer] would like a call back to update you about patient.  Cb# is [phone] ext.[phone].  Please advise.  Thank you.

## 2018-08-28 ENCOUNTER — Ambulatory Visit (INDEPENDENT_AMBULATORY_CARE_PROVIDER_SITE_OTHER): Payer: Self-pay | Admitting: Physician Assistant

## 2018-08-30 ENCOUNTER — Other Ambulatory Visit: Payer: Self-pay | Admitting: *Deleted

## 2018-08-30 MED ORDER — CELECOXIB 200 MG PO CAPS: 200.0000 mg | ORAL_CAPSULE | Freq: Every day | ORAL | 0 refills | Status: DC

## 2018-08-30 MED ORDER — PANTOPRAZOLE SODIUM 40 MG PO TBEC: 40.0000 mg | DELAYED_RELEASE_TABLET | Freq: Two times a day (BID) | ORAL | 3 refills | Status: DC

## 2018-08-30 MED ORDER — CLOPIDOGREL BISULFATE 75 MG PO TABS: 75.0000 mg | ORAL_TABLET | Freq: Every day | ORAL | 1 refills | Status: DC

## 2018-08-30 NOTE — Telephone Encounter (Signed)
Patient notified. Patient agreed to stop the Omeprazole and take Protonix. Patient does not agree with the Celebrex. Stated that he has taken this for a long time and it helps him. Stated that he wasn't concern with the side effects from it or the comorbidities . I explained to patient that this would be his last refill and he stated that he [name] have to find him a new doctor.

## 2018-08-30 NOTE — Addendum Note (Signed)
Addended by: [name] on: 08/30/2018 12:42 PM   Modules accepted: Orders

## 2018-08-30 NOTE — Telephone Encounter (Signed)
He should also not be taking omeprazole with plavix (this decreases the effects of plavix) to STOP omeprazole at this time and start PROTONIX 40 mg twice daily

## 2018-08-30 NOTE — Telephone Encounter (Signed)
Patient requested Refill. Walked into office with medication bottles. Pharmacy had only filled his Celebrex for once daily and he takes it twice daily. Confirmed.   Pended and sent to [facility] for Approval due to [employer].

## 2018-08-30 NOTE — Telephone Encounter (Signed)
We do not prescribe his celebrex

## 2018-08-30 NOTE — Telephone Encounter (Signed)
This is fine, I thought that they were being prescribed at pain management, due to his age and comorbidies his max dose should be 200mg  daily and only used for a short duration. This should not be a long term medication. Will need to reduce dose to daily at this time.

## 2018-08-30 NOTE — Telephone Encounter (Signed)
Who should I tell him to contact to get his Rx's refilled. I show in history that we have refilled his Celebrex since June. Please Advise.

## 2018-09-01 ENCOUNTER — Telehealth (INDEPENDENT_AMBULATORY_CARE_PROVIDER_SITE_OTHER): Payer: Self-pay

## 2018-09-01 NOTE — Telephone Encounter (Signed)
Can you please call pt and try and make an appt for some time next week?  I called and sw [name] and she advised that the pt went to an urgent care instead of keeping appt with Dr. Sharol Given this week. He was not please with his care there and she suggested he come back to our office. I advised that the office will reach out to the pt again and try and sch an appt.

## 2018-09-01 NOTE — Telephone Encounter (Signed)
Carol case manager([facility]) called and left voicemail and states patient Cx appt on 08/28/2018. Would like callback to discuss with you about wound.  CB [phone]  Ext [phone]

## 2018-09-04 ENCOUNTER — Other Ambulatory Visit: Payer: Self-pay | Admitting: Physician Assistant

## 2018-09-05 ENCOUNTER — Other Ambulatory Visit: Payer: Self-pay | Admitting: Physician Assistant

## 2018-09-05 ENCOUNTER — Telehealth: Payer: Self-pay | Admitting: *Deleted
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
## 2018-09-06 NOTE — Telephone Encounter (Signed)
Looks like clonazepam is pended for [employer] approval. Pt calling about refill.

## 2018-09-06 NOTE — Telephone Encounter (Signed)
We can see if patient would come in to discuss this. Otherwise I do not know if there is much we will be able to do. I agree with reaching out to his mental health providers as well

## 2018-09-06 NOTE — Telephone Encounter (Signed)
Tried calling patient. Phone picks up and hangs up. Tried twice. Will try again later.

## 2018-09-07 NOTE — Telephone Encounter (Signed)
Tried calling patient. Phone just rings. NA

## 2018-09-11 NOTE — Telephone Encounter (Signed)
Tried calling patient. NA

## 2018-09-17 ENCOUNTER — Other Ambulatory Visit: Payer: Self-pay | Admitting: Physician Assistant

## 2018-09-19 NOTE — Telephone Encounter (Signed)
Need to verify mail order pharmacy or [facility] left message

## 2018-09-22 NOTE — Telephone Encounter (Signed)
Left message to clarify pharmacy

## 2018-09-28 ENCOUNTER — Ambulatory Visit: Payer: PRIVATE HEALTH INSURANCE | Admitting: Physician Assistant

## 2018-09-29 ENCOUNTER — Encounter: Payer: Self-pay | Admitting: Physician Assistant

## 2018-09-29 ENCOUNTER — Ambulatory Visit: Payer: PRIVATE HEALTH INSURANCE | Admitting: Physician Assistant

## 2018-09-29 DIAGNOSIS — F4310 Post-traumatic stress disorder, unspecified: Secondary | ICD-10-CM | POA: Diagnosis not present

## 2018-09-29 DIAGNOSIS — F22 Delusional disorders: Secondary | ICD-10-CM | POA: Diagnosis not present

## 2018-09-29 DIAGNOSIS — F331 Major depressive disorder, recurrent, moderate: Secondary | ICD-10-CM | POA: Diagnosis not present

## 2018-09-29 DIAGNOSIS — G2581 Restless legs syndrome: Secondary | ICD-10-CM | POA: Diagnosis not present

## 2018-09-29 NOTE — Progress Notes (Signed)
[facility]  Patient ID: [name],  MRN: [id number]  PCP: [name], NP  Date of Evaluation: 09/29/2018 Time spent:15 minutes  Chief Complaint:  Chief Complaint    Follow-up      HISTORY/CURRENT STATUS: HPI Here for routine med check.   Patient denies loss of interest in usual activities and is able to enjoy things.  Denies decreased energy or motivation.  Appetite has not changed.  No extreme sadness, tearfulness, or feelings of hopelessness.  Denies any changes in concentration, making decisions or remembering things.  Denies suicidal or homicidal thoughts.   Patient denies increased energy with decreased need for sleep, no increased talkativeness, no racing thoughts, no impulsivity or risky behaviors, no increased spending, no increased libido, no grandiosity.  He does not mention any concerns of the FBI watching him or stealing things out of his apartment or anything like that at this visit.  Sleeps well.  Anxiety is well controlled.  He saw a new PCP, Dr. Doreatha Lew,  Through [facility], this morning.  States the NP he was seeing wasn't helping him so he felt like he needed to see a "real dr".  His new physician is sending him back to wound care because on his leg from months ago is not healing.  He also restarted Celebrex for the pain.  Drayton is happy about that because he has been hurting really bad from the arthritis and his opiates do not take care of that pain.  Denies muscle or joint pain, stiffness, or dystonia.  Denies dizziness, syncope, seizures, numbness, tingling, tremor, tics, unsteady gait, slurred speech, confusion.   Individual Medical History/ Review of Systems: Changes? :No   Past medications for mental health diagnoses include:Wellbutrin XL, Risperdal, Prozac, Zoloft, Paxil, Tofranil, gabapentin, ropinirole, Klonopin  Allergies: Ivp dye [iodinated diagnostic agents]; Penicillins; Sulfa antibiotics; Sulfonamide  derivatives; Lisinopril; Statins; and Propoxyphene n-acetaminophen  Current Medications:  Current Outpatient Medications:  .  ACCU-CHEK FASTCLIX LANCETS MISC, Use as directed to check blood glucose. Dx: E11.51, Disp: 102 each, Rfl: 6 .  albuterol (PROAIR HFA) 108 (90 Base) MCG/ACT inhaler, Inhale 2 puffs into the lungs every 6 (six) hours as needed for wheezing or shortness of breath., Disp: , Rfl:  .  Blood Glucose Monitoring Suppl (ACCU-CHEK AVIVA PLUS) w/Device KIT, Use as directed to check blood glucose daily. Dx: E11.51, Disp: 1 kit, Rfl: 0 .  buPROPion (WELLBUTRIN XL) 150 MG 24 hr tablet, Take 3 tablets (450 mg total) by mouth daily. Take 3 (450 mg) each morning, Disp: 270 tablet, Rfl: 1 .  celecoxib (CELEBREX) 200 MG capsule, Take 1 capsule (200 mg total) by mouth daily. (Patient taking differently: Take 200 mg by mouth daily. NO MORE REFILLS. LAST RF GIVEN TODAY 08/30/18 per [name]), Disp: 30 capsule, Rfl: 0 .  Cholecalciferol (VITAMIN D3) 5000 units TABS, Take 5,000 Units by mouth daily., Disp: , Rfl:  .  clonazePAM (KLONOPIN) 0.5 MG tablet, TAKE 1 TABLET BY MOUTH TWICE DAILY AS NEEDED FOR ANXIETY, Disp: 60 tablet, Rfl: 2 .  clopidogrel (PLAVIX) 75 MG tablet, Take 1 tablet (75 mg total) by mouth daily., Disp: 90 tablet, Rfl: 1 .  cyclobenzaprine (FLEXERIL) 10 MG tablet, Take 10 mg by mouth 3 (three) times daily as needed for muscle spasms. , Disp: , Rfl: 0 .  desonide (DESOWEN) 0.05 % cream, Apply 1 application topically daily as needed (rosacea). , Disp: , Rfl:  .  fentaNYL (DURAGESIC - DOSED MCG/HR) 50  MCG/HR, Place 50 mcg onto the skin every 3 (three) days., Disp: , Rfl:  .  furosemide (LASIX) 20 MG tablet, Take 1 tablet (20 mg total) by mouth daily., Disp: 30 tablet, Rfl:  .  gabapentin (NEURONTIN) 400 MG capsule, 1 q am, 2 qhs, Disp: 270 capsule, Rfl: 1 .  glucose blood (ACCU-CHEK AVIVA PLUS) test strip, 1 each by Other route as needed for other. Use as instructed. Dx: E11.51, Disp:  100 each, Rfl: 6 .  IRON PO, Take 1 capsule by mouth daily., Disp: , Rfl:  .  JANUVIA 100 MG tablet, TAKE 1 TABLET BY MOUTH EVERY DAY, Disp: 90 tablet, Rfl: 1 .  losartan (COZAAR) 25 MG tablet, Take 1 tablet (25 mg total) by mouth daily., Disp: 90 tablet, Rfl: 1 .  Multiple Vitamin (MULTIVITAMIN WITH MINERALS) TABS tablet, Take 1 tablet by mouth daily., Disp: , Rfl:  .  nitroGLYCERIN (NITROSTAT) 0.4 MG SL tablet, Place 0.4 mg under the tongue every 5 (five) minutes as needed for chest pain., Disp: , Rfl:  .  pantoprazole (PROTONIX) 40 MG tablet, Take 1 tablet (40 mg total) by mouth 2 (two) times daily., Disp: 30 tablet, Rfl: 3 .  potassium chloride (K-DUR) 10 MEQ tablet, Take 1 tablet (10 mEq total) by mouth daily., Disp: , Rfl:  .  rOPINIRole (REQUIP) 1 MG tablet, Take 2 tablets (2 mg total) by mouth at bedtime., Disp: 180 tablet, Rfl: 1 .  tamsulosin (FLOMAX) 0.4 MG CAPS capsule, Take 0.4 mg by mouth 2 (two) times daily., Disp: , Rfl:  .  zaleplon (SONATA) 10 MG capsule, Take 10 mg by mouth at bedtime as needed for sleep. May repeat for MNA if has 3 hours left to sleep., Disp: , Rfl:  .  zonisamide (ZONEGRAN) 50 MG capsule, TAKE 3 CAPSULES(150 MG) BY MOUTH AT BEDTIME, Disp: 270 capsule, Rfl: 1 .  silver sulfADIAZINE (SILVADENE) 1 % cream, Apply 1 application topically daily. Apply to affected area daily, Disp: 450 g, Rfl: 0 Medication Side Effects: none  Family Medical/ Social History: Changes? No  MENTAL HEALTH EXAM:  There were no vitals taken for this visit.There is no height or weight on file to calculate BMI.  General Appearance: Casual and Well Groomed  Eye Contact:  Good  Speech:  Clear and Coherent  Volume:  Normal  Mood:  Euthymic  Affect:  Appropriate  Thought Process:  Goal Directed  Orientation:  Full (Time, Place, and Person)  Thought Content: Paranoid Ideation   Suicidal Thoughts:  No  Homicidal Thoughts:  No  Memory:  WNL  Judgement:  Good  Insight:  Good   Psychomotor Activity:  Normal uses walking cane  Concentration:  Concentration: Good  Recall:  Good  Fund of Knowledge: Good  Language: Good  Assets:  Desire for Improvement  ADL's:  Intact  Cognition: WNL  Prognosis:  Good    DIAGNOSES:    ICD-10-CM   1. Major depressive disorder, recurrent episode, moderate (HCC) F33.1   2. RLS (restless legs syndrome) G25.81   3. Paranoia ([place]) F22   4. PTSD (post-traumatic stress disorder) F43.10     Receiving Psychotherapy: No    RECOMMENDATIONS: PDMP was reviewed. Continue Wellbutrin XL 435m qd Continue Klonopin 0.523mpo bid prn Continue Gabapentin 40068m q a.m. 2 qhs. Continue Requip 1 mg, 2 qhs. Return in 2 months.    TerDonnal MoatA-C

## 2018-10-01 ENCOUNTER — Other Ambulatory Visit: Payer: Self-pay | Admitting: Nurse Practitioner

## 2018-10-18 ENCOUNTER — Ambulatory Visit: Payer: PRIVATE HEALTH INSURANCE | Admitting: Nurse Practitioner

## 2018-10-27 ENCOUNTER — Other Ambulatory Visit: Payer: Self-pay | Admitting: Nurse Practitioner

## 2018-10-27 NOTE — Telephone Encounter (Signed)
Per note from [facility] patient is established with a new PCP  RX refused with the indication patient no longer under Jessica's care

## 2018-11-16 ENCOUNTER — Other Ambulatory Visit: Payer: Self-pay | Admitting: Nurse Practitioner

## 2018-11-17 ENCOUNTER — Other Ambulatory Visit: Payer: Self-pay

## 2018-11-17 DIAGNOSIS — I10 Essential (primary) hypertension: Secondary | ICD-10-CM

## 2018-11-17 MED ORDER — CYCLOBENZAPRINE HCL 10 MG PO TABS: 10.0000 mg | ORAL_TABLET | Freq: Three times a day (TID) | ORAL | 0 refills | Status: DC | PRN

## 2018-11-19 ENCOUNTER — Other Ambulatory Visit: Payer: Self-pay | Admitting: Cardiology

## 2018-11-28 ENCOUNTER — Ambulatory Visit: Payer: PRIVATE HEALTH INSURANCE | Admitting: Physician Assistant

## 2018-11-28 DIAGNOSIS — M21172 Varus deformity, not elsewhere classified, left ankle: Secondary | ICD-10-CM | POA: Insufficient documentation

## 2018-12-01 ENCOUNTER — Other Ambulatory Visit: Payer: Self-pay | Admitting: Physician Assistant

## 2018-12-01 ENCOUNTER — Telehealth: Payer: Self-pay | Admitting: Physician Assistant

## 2018-12-01 MED ORDER — CLONAZEPAM 0.5 MG PO TABS: 0.5000 mg | ORAL_TABLET | Freq: Two times a day (BID) | ORAL | 2 refills | Status: DC | PRN

## 2018-12-01 NOTE — Telephone Encounter (Signed)
Pt. Called he is out of Clonazepam completely. [facility] [address]. Just didn't realize he was out.

## 2018-12-11 ENCOUNTER — Other Ambulatory Visit: Payer: Self-pay | Admitting: Nurse Practitioner

## 2018-12-11 ENCOUNTER — Other Ambulatory Visit: Payer: Self-pay | Admitting: Physician Assistant

## 2018-12-11 DIAGNOSIS — I10 Essential (primary) hypertension: Secondary | ICD-10-CM

## 2018-12-11 NOTE — Telephone Encounter (Signed)
Refill request

## 2018-12-25 ENCOUNTER — Other Ambulatory Visit: Payer: Self-pay | Admitting: Nurse Practitioner

## 2018-12-25 DIAGNOSIS — I10 Essential (primary) hypertension: Secondary | ICD-10-CM

## 2019-01-03 ENCOUNTER — Other Ambulatory Visit: Payer: Self-pay

## 2019-01-03 ENCOUNTER — Encounter: Payer: Self-pay | Admitting: Physician Assistant

## 2019-01-03 ENCOUNTER — Ambulatory Visit (INDEPENDENT_AMBULATORY_CARE_PROVIDER_SITE_OTHER): Payer: PRIVATE HEALTH INSURANCE | Admitting: Physician Assistant

## 2019-01-03 DIAGNOSIS — F411 Generalized anxiety disorder: Secondary | ICD-10-CM

## 2019-01-03 DIAGNOSIS — F4310 Post-traumatic stress disorder, unspecified: Secondary | ICD-10-CM

## 2019-01-03 DIAGNOSIS — F331 Major depressive disorder, recurrent, moderate: Secondary | ICD-10-CM | POA: Diagnosis not present

## 2019-01-03 DIAGNOSIS — G2581 Restless legs syndrome: Secondary | ICD-10-CM | POA: Diagnosis not present

## 2019-01-03 NOTE — Progress Notes (Signed)
[facility]  Patient ID: [name],  MRN: [id number]  PCP: Patient, No Pcp Per  Date of Evaluation: 01/03/2019 Time spent:15 minutes  Chief Complaint:  Chief Complaint    Follow-up     Virtual Visit via Telephone Note  I connected with patient by a video enabled telemedicine application or telephone, with their informed consent, and verified patient privacy and that I am speaking with the correct person using two identifiers.  I am private, in my home and the patient is home.   I discussed the limitations, risks, security and privacy concerns of performing an evaluation and management service by telephone and the availability of in person appointments. I also discussed with the patient that there may be a patient responsible charge related to this service. The patient expressed understanding and agreed to proceed.   I discussed the assessment and treatment plan with the patient. The patient was provided an opportunity to ask questions and all were answered. The patient agreed with the plan and demonstrated an understanding of the instructions.   The patient was advised to call back or seek an in-person evaluation if the symptoms worsen or if the condition fails to improve as anticipated.  I provided 15 minutes of non-face-to-face time during this encounter.  HISTORY/CURRENT STATUS: HPI for routine med check.   He decreased the Wellbutrin back to 366m b/c he started having shaking legs again. That's better now that he went back down on the dose. And his mood hasn't worsened.   Able to enjoy things. Denies decreased energy or motivation.  Appetite has not changed.  No extreme sadness, tearfulness, or feelings of hopelessness.  Denies any changes in concentration, making decisions or remembering things.  Denies suicidal or homicidal thoughts. Denies hallucinations.   Patient denies increased energy with decreased need for sleep, no increased talkativeness, no  racing thoughts, no impulsivity or risky behaviors, no increased spending, no increased libido, no grandiosity.  Denies dizziness, syncope, seizures, numbness, tingling, tremor, tics, unsteady gait, slurred speech, confusion. Denies muscle or joint pain, stiffness, or dystonia.  States he is trying to be careful with the coronavirus pandemic and not going out unless he has to.  He was running a fever a few weeks ago but it turned out to be his foot was infected again.  He is now going to the wound clinic again and is doing better.  Individual Medical History/ Review of Systems: Changes? :Yes See above  Past medications for mental health diagnoses include:Wellbutrin XL, Risperdal, Prozac, Zoloft, Paxil, Tofranil, gabapentin, ropinirole, Klonopin  Allergies: Ivp dye [iodinated diagnostic agents]; Penicillins; Sulfa antibiotics; Sulfonamide derivatives; Lisinopril; Statins; and Propoxyphene n-acetaminophen  Current Medications:  Current Outpatient Medications:  .  ACCU-CHEK FASTCLIX LANCETS MISC, Use as directed to check blood glucose. Dx: E11.51, Disp: 102 each, Rfl: 6 .  albuterol (PROAIR HFA) 108 (90 Base) MCG/ACT inhaler, Inhale 2 puffs into the lungs every 6 (six) hours as needed for wheezing or shortness of breath., Disp: , Rfl:  .  Blood Glucose Monitoring Suppl (ACCU-CHEK AVIVA PLUS) w/Device KIT, Use as directed to check blood glucose daily. Dx: E11.51, Disp: 1 kit, Rfl: 0 .  buPROPion (WELLBUTRIN XL) 150 MG 24 hr tablet, TAKE 3 TABLETS (=450MG)    DAILY IN THE MORNING (Patient taking differently: 300 mg. ), Disp: 270 tablet, Rfl: 1 .  celecoxib (CELEBREX) 200 MG capsule, TAKE 1 CAPSULE(200 MG) BY MOUTH DAILY, Disp: 30 capsule, Rfl: 0 .  Cholecalciferol (VITAMIN D3) 5000 units  TABS, Take 5,000 Units by mouth daily., Disp: , Rfl:  .  clonazePAM (KLONOPIN) 0.5 MG tablet, Take 1 tablet (0.5 mg total) by mouth 2 (two) times daily as needed. for anxiety, Disp: 60 tablet, Rfl: 2 .  clopidogrel  (PLAVIX) 75 MG tablet, Take 1 tablet (75 mg total) by mouth daily., Disp: 90 tablet, Rfl: 1 .  cyclobenzaprine (FLEXERIL) 10 MG tablet, Take 1 tablet (10 mg total) by mouth 3 (three) times daily as needed for muscle spasms., Disp: 30 tablet, Rfl: 0 .  desonide (DESOWEN) 0.05 % cream, Apply 1 application topically daily as needed (rosacea). , Disp: , Rfl:  .  fentaNYL (DURAGESIC - DOSED MCG/HR) 50 MCG/HR, Place 50 mcg onto the skin every 3 (three) days., Disp: , Rfl:  .  furosemide (LASIX) 20 MG tablet, TAKE 1 TABLET BY MOUTH TWICE DAILY, Disp: 60 tablet, Rfl: 1 .  gabapentin (NEURONTIN) 400 MG capsule, 1 q am, 2 qhs, Disp: 270 capsule, Rfl: 1 .  glucose blood (ACCU-CHEK AVIVA PLUS) test strip, 1 each by Other route as needed for other. Use as instructed. Dx: E11.51, Disp: 100 each, Rfl: 6 .  IRON PO, Take 1 capsule by mouth daily., Disp: , Rfl:  .  JANUVIA 100 MG tablet, TAKE 1 TABLET BY MOUTH EVERY DAY, Disp: 90 tablet, Rfl: 1 .  losartan (COZAAR) 25 MG tablet, TAKE 1 TABLET DAILY, Disp: 90 tablet, Rfl: 0 .  Multiple Vitamin (MULTIVITAMIN WITH MINERALS) TABS tablet, Take 1 tablet by mouth daily., Disp: , Rfl:  .  nitroGLYCERIN (NITROSTAT) 0.4 MG SL tablet, Place 0.4 mg under the tongue every 5 (five) minutes as needed for chest pain., Disp: , Rfl:  .  pantoprazole (PROTONIX) 40 MG tablet, Take 1 tablet (40 mg total) by mouth 2 (two) times daily., Disp: 30 tablet, Rfl: 3 .  potassium chloride (K-DUR) 10 MEQ tablet, Take 1 tablet (10 mEq total) by mouth daily., Disp: , Rfl:  .  rOPINIRole (REQUIP) 1 MG tablet, TAKE 2 TABLETS (=2MG) AT   BEDTIME, Disp: 180 tablet, Rfl: 1 .  silver sulfADIAZINE (SILVADENE) 1 % cream, Apply 1 application topically daily. Apply to affected area daily, Disp: 450 g, Rfl: 0 .  zonisamide (ZONEGRAN) 50 MG capsule, TAKE 3 CAPSULES(150 MG) BY MOUTH AT BEDTIME, Disp: 270 capsule, Rfl: 1 .  tamsulosin (FLOMAX) 0.4 MG CAPS capsule, Take 1 capsule (0.4 mg total) by mouth 2 (two)  times daily., Disp: 180 capsule, Rfl: 0 Medication Side Effects: none  Family Medical/ Social History: Changes? No.  He is staying home as much as possible due to the coronavirus pandemic.  MENTAL HEALTH EXAM:  There were no vitals taken for this visit.There is no height or weight on file to calculate BMI.  General Appearance: unable to assess  Eye Contact:  unable to assess  Speech:  Clear and Coherent  Volume:  Normal  Mood:  Euthymic  Affect:  unable to assess  Thought Process:  Goal Directed  Orientation:  Full (Time, Place, and Person)  Thought Content: Logical   Suicidal Thoughts:  No  Homicidal Thoughts:  No  Memory:  WNL  Judgement:  Good  Insight:  Good  Psychomotor Activity:  unable to assess  Concentration:  Concentration: Good  Recall:  Good  Fund of Knowledge: Good  Language: Good  Assets:  Desire for Improvement  ADL's:  Intact  Cognition: WNL  Prognosis:  Good    DIAGNOSES:    ICD-10-CM   1. Major  depressive disorder, recurrent episode, moderate (HCC) F33.1   2. RLS (restless legs syndrome) G25.81   3. Generalized anxiety disorder F41.1   4. PTSD (post-traumatic stress disorder) F43.10     Receiving Psychotherapy: No    RECOMMENDATIONS:  Continue Wellbutrin XL 150 mg 2 p.o. daily. Continue Klonopin 0.5 mg twice daily as needed. Continue gabapentin 400 mg 1 every morning and 2 nightly. Continue ropinirole 1 mg 2 nightly. Return in 3 months.  Donnal Moat, PA-C   This record has been created using [employer].  Chart creation errors have been sought, but may not always have been located and corrected. Such creation errors do not reflect on the standard of medical care.

## 2019-01-12 ENCOUNTER — Other Ambulatory Visit: Payer: Self-pay | Admitting: Cardiology

## 2019-01-12 NOTE — Telephone Encounter (Signed)
Please fill

## 2019-01-15 DIAGNOSIS — I5043 Acute on chronic combined systolic (congestive) and diastolic (congestive) heart failure: Secondary | ICD-10-CM | POA: Insufficient documentation

## 2019-01-18 DIAGNOSIS — A4902 Methicillin resistant Staphylococcus aureus infection, unspecified site: Secondary | ICD-10-CM | POA: Insufficient documentation

## 2019-01-18 DIAGNOSIS — M869 Osteomyelitis, unspecified: Secondary | ICD-10-CM | POA: Insufficient documentation

## 2019-01-18 DIAGNOSIS — M5481 Occipital neuralgia: Secondary | ICD-10-CM | POA: Insufficient documentation

## 2019-01-18 DIAGNOSIS — E1151 Type 2 diabetes mellitus with diabetic peripheral angiopathy without gangrene: Secondary | ICD-10-CM | POA: Insufficient documentation

## 2019-02-09 ENCOUNTER — Other Ambulatory Visit: Payer: Self-pay | Admitting: Cardiology

## 2019-02-09 DIAGNOSIS — I10 Essential (primary) hypertension: Secondary | ICD-10-CM

## 2019-02-09 NOTE — Telephone Encounter (Signed)
Is this ok to fill? 

## 2019-02-28 ENCOUNTER — Other Ambulatory Visit: Payer: Self-pay | Admitting: Nurse Practitioner

## 2019-03-03 ENCOUNTER — Other Ambulatory Visit: Payer: Self-pay | Admitting: Physician Assistant

## 2019-03-05 ENCOUNTER — Telehealth: Payer: Self-pay | Admitting: Physician Assistant

## 2019-03-05 NOTE — Telephone Encounter (Signed)
Patient showed up in office wanting to know if script for Clonazepam was sent to pharmacy to [facility] on [address] patient has 3 pills left

## 2019-03-05 NOTE — Telephone Encounter (Signed)
Refill submitted this morning

## 2019-03-22 ENCOUNTER — Telehealth: Payer: Self-pay

## 2019-03-22 NOTE — Telephone Encounter (Signed)
Left message on voicemail for patient to return call when available   Reason for call: Patient is scheduled for an appointment tomorrow and we are not listed as the PCP

## 2019-03-23 ENCOUNTER — Ambulatory Visit: Payer: Self-pay

## 2019-03-23 ENCOUNTER — Telehealth: Payer: Self-pay | Admitting: Physician Assistant

## 2019-03-23 ENCOUNTER — Encounter: Payer: No Typology Code available for payment source | Admitting: Nurse Practitioner
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
## 2019-03-23 NOTE — Telephone Encounter (Signed)
Thanks
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
## 2019-03-23 NOTE — Telephone Encounter (Signed)
Left message on voicemail for patient to return call when available   

## 2019-04-03 ENCOUNTER — Ambulatory Visit: Payer: No Typology Code available for payment source | Admitting: Physician Assistant

## 2019-04-16 ENCOUNTER — Encounter: Payer: Self-pay | Admitting: Surgery

## 2019-04-16 ENCOUNTER — Other Ambulatory Visit: Payer: Self-pay

## 2019-04-16 ENCOUNTER — Ambulatory Visit (INDEPENDENT_AMBULATORY_CARE_PROVIDER_SITE_OTHER): Payer: No Typology Code available for payment source | Admitting: Surgery

## 2019-04-16 VITALS — BP 139/57 | HR 73 | Temp 97.7°F | Resp 20 | Ht 72.0 in | Wt 211.8 lb

## 2019-04-16 DIAGNOSIS — I70244 Atherosclerosis of native arteries of left leg with ulceration of heel and midfoot: Secondary | ICD-10-CM | POA: Diagnosis not present

## 2019-04-16 NOTE — Progress Notes (Signed)
Vascular and Vein Specialist of [facility]  Patient name: [name] MRN: [id number] DOB: 1945/09/24 Sex: male   REASON FOR VISIT:    Follow up  HISOTRY OF PRESENT ILLNESS:    [name] is a 73 y.o. 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  He is also status post left carotid endarterectomy on 04/01/2011 for retinal artery occlusion.  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  He has been followed at the wound center at [facility] as well as infectious disease for his osteo-.  Fortunately his wound on his metatarsal bone has healed.  He still has an opening on his left heel.  This is been there for at least a year.   The patient is a diabetic.  His most recent HbA1c was 6.5.  He is status post coronary artery bypass graft in 2017.  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  He is felt to be at too great of risk to undergo concomitant innominate revascularization as well as CABG.  He has been asymptomatic from his innominate stenosis.  He is medically managed for hypertension he is a former smoker.  He is not on a statin due to allergies   PAST MEDICAL HISTORY:   Past Medical History:  Diagnosis Date  . Anemia    NOS  . Anxiety   . 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 BPH (benign prostatic hypertrophy)   . Broken foot Oct. 12, 2013   Right foot Fx  . CAD (coronary artery disease)   . Carotid stenosis, left   . Cellulitis and abscess of leg 03/2018   right leg  . Chronic hyponatremia   . Chronic pain syndrome   . COPD (chronic obstructive pulmonary disease) ([place])   . Depression   . Diabetes mellitus   . Diabetic foot ulcer ([place])   . DJD (degenerative joint  disease)    L wrist  . DM2 (diabetes mellitus, type 2) ([place])   . GERD (gastroesophageal reflux disease)   . HLD (hyperlipidemia)   . HTN (hypertension)   . Hyponatremia   . Lumbar disc disease   . Osteoporosis    tx per [facility], Dr Prudencio Burly, thought due to heavy steriod use after 1978  . Peripheral neuropathy   . Peripheral vascular disease ([place])   . Spine fracture    hx, multiple  . Varicose veins   . 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 Vitamin D deficiency      FAMILY HISTORY:   Family History  Problem Relation Age of Onset  . Lung cancer Father 3  . Diabetes Mother 15  . Osteoporosis Mother   . Depression Mother   . Bleeding Disorder Mother   . Throat cancer Other        family hx - "bad living" - also lung CA, heart disease and renal failure    . Diabetes Sister   . Suicidality Son 32  . Lung cancer Maternal Grandmother 30  . Diabetes Maternal Grandfather 81  . Heart disease Maternal Grandfather   . Heart attack Paternal Grandmother   . Brain cancer Paternal Grandfather 35    SOCIAL HISTORY:   Social History   Tobacco Use  .  Smoking status: Former Smoker    Packs/day: 1.50    Years: 30.00    Pack years: 45.00    Types: Cigarettes    Quit date: 11/06/1988    Years since quitting: 30.4  . Smokeless tobacco: Never Used  Substance Use Topics  . Alcohol use: Not Currently    Comment: None in 30 years but drank beer before     ALLERGIES:   Allergies  Allergen Reactions  . Ivp Dye [Iodinated Diagnostic Agents]     Blood Pressure dropped----pt was pre-medicated with 13 hour prep and did fine with pre-meds--amy 03/09/11   . Penicillins Nausea Only    REACTION: stomach pain(IV ok) Has patient had a PCN reaction causing immediate rash, facial/tongue/throat swelling, SOB or lightheadedness with hypotension: No Has patient had a PCN reaction causing severe rash involving mucus membranes or skin necrosis: No Has patient had a PCN reaction that  required hospitalization: No Has patient had a PCN reaction occurring within the last 10 years: No If all of the above answers are "NO", then may proceed with Cephalosporin use.   . Sulfa Antibiotics Hives  . Sulfonamide Derivatives Hives and Itching  . Lisinopril Cough  . Statins Other (See Comments)    myalgias  . Propoxyphene N-Acetaminophen Other (See Comments)    Sharp pains- headache     CURRENT MEDICATIONS:   Current Outpatient Medications  Medication Sig Dispense Refill  . ACCU-CHEK FASTCLIX LANCETS MISC Use as directed to check blood glucose. Dx: E11.51 102 each 6  . albuterol (PROAIR HFA) 108 (90 Base) MCG/ACT inhaler Inhale 2 puffs into the lungs every 6 (six) hours as needed for wheezing or shortness of breath.    . Blood Glucose Monitoring Suppl (ACCU-CHEK AVIVA PLUS) w/Device KIT Use as directed to check blood glucose daily. Dx: E11.51 1 kit 0  . buPROPion (WELLBUTRIN XL) 150 MG 24 hr tablet TAKE 3 TABLETS (=450MG)    DAILY IN THE MORNING (Patient taking differently: 300 mg. ) 270 tablet 1  . celecoxib (CELEBREX) 200 MG capsule TAKE 1 CAPSULE(200 MG) BY MOUTH DAILY 30 capsule 0  . Cholecalciferol (VITAMIN D3) 5000 units TABS Take 5,000 Units by mouth daily.    . clonazePAM (KLONOPIN) 0.5 MG tablet TAKE 1 TABLET(0.5 MG) BY MOUTH TWICE DAILY AS NEEDED FOR ANXIETY 60 tablet 0  . clopidogrel (PLAVIX) 75 MG tablet Take 1 tablet (75 mg total) by mouth daily. 90 tablet 1  . cyclobenzaprine (FLEXERIL) 10 MG tablet TAKE 1 TABLET(10 MG) BY MOUTH THREE TIMES DAILY AS NEEDED FOR MUSCLE SPASMS 30 tablet 0  . desonide (DESOWEN) 0.05 % cream Apply 1 application topically daily as needed (rosacea).     . fentaNYL (DURAGESIC - DOSED MCG/HR) 50 MCG/HR Place 50 mcg onto the skin every 3 (three) days.    . furosemide (LASIX) 20 MG tablet TAKE 1 TABLET BY MOUTH TWICE DAILY 60 tablet 1  . gabapentin (NEURONTIN) 400 MG capsule 1 q am, 2 qhs 270 capsule 1  . glucose blood (ACCU-CHEK AVIVA PLUS)  test strip 1 each by Other route as needed for other. Use as instructed. Dx: E11.51 100 each 6  . IRON PO Take 1 capsule by mouth daily.    Marland Kitchen JANUVIA 100 MG tablet TAKE 1 TABLET BY MOUTH EVERY DAY 90 tablet 1  . losartan (COZAAR) 25 MG tablet TAKE 1 TABLET DAILY 90 tablet 0  . Multiple Vitamin (MULTIVITAMIN WITH MINERALS) TABS tablet Take 1 tablet by mouth daily.    Marland Kitchen  nitroGLYCERIN (NITROSTAT) 0.4 MG SL tablet Place 0.4 mg under the tongue every 5 (five) minutes as needed for chest pain.    . pantoprazole (PROTONIX) 40 MG tablet Take 1 tablet (40 mg total) by mouth 2 (two) times daily. 30 tablet 3  . potassium chloride (K-DUR) 10 MEQ tablet Take 1 tablet (10 mEq total) by mouth daily.    Marland Kitchen rOPINIRole (REQUIP) 1 MG tablet TAKE 2 TABLETS (=2MG) AT   BEDTIME 180 tablet 1  . silver sulfADIAZINE (SILVADENE) 1 % cream Apply 1 application topically daily. Apply to affected area daily 450 g 0  . tamsulosin (FLOMAX) 0.4 MG CAPS capsule Take 1 capsule (0.4 mg total) by mouth 2 (two) times daily. 180 capsule 0  . zonisamide (ZONEGRAN) 50 MG capsule TAKE 3 CAPSULES(150 MG) BY MOUTH AT BEDTIME 270 capsule 1   No current facility-administered medications for this visit.     REVIEW OF SYSTEMS:   _0  denotes positive finding, _1  denotes negative finding Cardiac  Comments:  Chest pain or chest pressure:    Shortness of breath upon exertion:    Short of breath when lying flat:    Irregular heart rhythm:        Vascular    Pain in calf, thigh, or hip brought on by ambulation:    Pain in feet at night that wakes you up from your sleep: xx    Blood clot in your veins:    Leg swelling:  x       Pulmonary    Oxygen at home:    Productive cough:     Wheezing:         Neurologic    Sudden weakness in arms or legs:     Sudden numbness in arms or legs:     Sudden onset of difficulty speaking or slurred speech:    Temporary loss of vision in one eye:     Problems with dizziness:          Gastrointestinal    Blood in stool:     Vomited blood:         Genitourinary    Burning when urinating:     Blood in urine:        Psychiatric    Major depression:         Hematologic    Bleeding problems:    Problems with blood clotting too easily:        Skin    Rashes or ulcers:        Constitutional    Fever or chills:      PHYSICAL EXAM:   There were no vitals filed for this visit.  GENERAL: The patient is a well-nourished male, in no acute distress. The vital signs are documented above. CARDIAC: There is a regular rate and rhythm.  VASCULAR: left pedal pulse not palpable PULMONARY: Non-labored respirations ABDOMEN: Soft and non-tender with normal pitched bowel sounds.  MUSCULOSKELETAL: There are no major deformities or cyanosis. NEUROLOGIC: No focal weakness or paresthesias are detected. SKIN: There are no ulcers or rashes noted. PSYCHIATRIC: The patient has a normal affect.  STUDIES:   none  MEDICAL ISSUES:   Carotid disease: I have scheduled the patient for surveillance duplex in 1 year.  His ultrasound last year was widely patent.  Lower extremity vascular disease with ulceration: The patient was fortunately able to heal a wound with osteomyelitis on the lateral side of his left foot thanks to the care he received  at [facility] via the wound center and infectious disease.  He still has a wound on his heel which is been present for quite some time in fact last year I had recommended angiography however he never agreed to it.  Because he still has a open wound on his left leg, I stressed the importance of proceeding with angiography to define his anatomy and improved blood flow to his left foot if he has options for revascularization.  I have recommended proceeding via a left brachial approach.  He also may require antegrade access in the left groin for intervention based off the location of his lesion.  I discussed with the patient that this is a limb threatening  situation.  He is dealing with multiple social issues and is somewhat reluctant to proceed.  He plans on contacting me in the immediate future to schedule his procedure.    Leia Alf, MD, FACS Vascular and Vein Specialists of [facility] [phone] Pager [phone]

## 2019-05-10 ENCOUNTER — Ambulatory Visit: Payer: Medicare Other | Admitting: Physician Assistant

## 2019-05-10 ENCOUNTER — Encounter

## 2019-05-15 ENCOUNTER — Other Ambulatory Visit: Payer: Self-pay | Admitting: Physician Assistant

## 2019-05-16 ENCOUNTER — Telehealth: Payer: Self-pay | Admitting: Physician Assistant

## 2019-05-16 NOTE — Telephone Encounter (Signed)
Patient need refill on Clonazepam to be sent to [facility] on [address], patient stated he called yesterday regarding the refill the pharmacy stated they did not receive request, patient was in the hospital for 2 weeks and he is out of medication

## 2019-05-16 NOTE — Telephone Encounter (Signed)
Refill pended for [name] but will go ahead and send, was not able to get completed

## 2019-05-16 NOTE — Telephone Encounter (Signed)
appt 10/22

## 2019-05-19 DIAGNOSIS — I214 Non-ST elevation (NSTEMI) myocardial infarction: Secondary | ICD-10-CM | POA: Insufficient documentation

## 2019-06-07 ENCOUNTER — Ambulatory Visit (INDEPENDENT_AMBULATORY_CARE_PROVIDER_SITE_OTHER): Payer: No Typology Code available for payment source | Admitting: Physician Assistant

## 2019-06-07 ENCOUNTER — Encounter: Payer: Self-pay | Admitting: Physician Assistant

## 2019-06-07 ENCOUNTER — Other Ambulatory Visit: Payer: Self-pay

## 2019-06-07 DIAGNOSIS — F331 Major depressive disorder, recurrent, moderate: Secondary | ICD-10-CM

## 2019-06-07 DIAGNOSIS — G2581 Restless legs syndrome: Secondary | ICD-10-CM | POA: Diagnosis not present

## 2019-06-07 DIAGNOSIS — F411 Generalized anxiety disorder: Secondary | ICD-10-CM

## 2019-06-07 MED ORDER — CLONAZEPAM 0.5 MG PO TABS: 0.5000 mg | ORAL_TABLET | Freq: Two times a day (BID) | ORAL | 5 refills | Status: DC | PRN

## 2019-06-07 MED ORDER — SERTRALINE HCL 50 MG PO TABS: 50.0000 mg | ORAL_TABLET | Freq: Every day | ORAL | 1 refills | Status: DC

## 2019-06-07 NOTE — Progress Notes (Signed)
[facility] Check  Patient ID: [name],  MRN: [id number]  PCP: Patient, No Pcp Per  Date of Evaluation: 06/07/2019 Time spent:15 minutes  Chief Complaint:  Chief Complaint    Anxiety; Follow-up     Virtual Visit via Telephone Note  I connected with patient by a video enabled telemedicine application or telephone, with their informed consent, and verified patient privacy and that I am speaking with the correct person using two identifiers.  I am private, in my office and the patient is in his car.   I discussed the limitations, risks, security and privacy concerns of performing an evaluation and management service by telephone and the availability of in person appointments. I also discussed with the patient that there may be a patient responsible charge related to this service. The patient expressed understanding and agreed to proceed.   I discussed the assessment and treatment plan with the patient. The patient was provided an opportunity to ask questions and all were answered. The patient agreed with the plan and demonstrated an understanding of the instructions.   The patient was advised to call back or seek an in-person evaluation if the symptoms worsen or if the condition fails to improve as anticipated.  I provided 15 minutes of non-face-to-face time during this encounter.  HISTORY/CURRENT STATUS: HPI For routine med check.  See Med Hx.  Seems to be healing well and feels better physically.  He does report that he has more "down days."  He used to take Zoloft and wonders if that could be added back in.  States it did help him when he took it a long time ago.  He denies suicidal or homicidal thoughts.  He has times where he does not enjoy much of anything.  His energy and motivation are low but he is not sure if that is because of the physical symptoms or mental health reasons.  Denies hallucinations.  No increased energy with decreased need for sleep.  He sleeps  well for the most part.  No impulsivity or risky behaviors.  No increased spending.  No grandiosity.  Denies dizziness, syncope, seizures, numbness, tingling, tremor, tics, unsteady gait, slurred speech, confusion. Denies muscle or joint pain, stiffness, or dystonia.  Individual Medical History/ Review of Systems: Changes? :Yes  had osteomyelitis left foot.  Had IV antibiotics for 2 months. 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  Had another MI   Past medications for mental health diagnoses include:Wellbutrin XL, Risperdal, Prozac, Zoloft, Paxil, Tofranil, gabapentin, ropinirole, Klonopin  Allergies: Ivp dye [iodinated diagnostic agents], Penicillins, Sulfa antibiotics, Sulfonamide derivatives, Lisinopril, Statins, and Propoxyphene n-acetaminophen  Current Medications:  Current Outpatient Medications:  .  ACCU-CHEK FASTCLIX LANCETS MISC, Use as directed to check blood glucose. Dx: E11.51, Disp: 102 each, Rfl: 6 .  albuterol (PROAIR HFA) 108 (90 Base) MCG/ACT inhaler, Inhale 2 puffs into the lungs every 6 (six) hours as needed for wheezing or shortness of breath., Disp: , Rfl:  .  Blood Glucose Monitoring Suppl (ACCU-CHEK AVIVA PLUS) w/Device KIT, Use as directed to check blood glucose daily. Dx: E11.51, Disp: 1 kit, Rfl: 0 .  buPROPion (WELLBUTRIN XL) 150 MG 24 hr tablet, TAKE 3 TABLETS (=450MG)    DAILY IN THE MORNING (Patient taking differently: 300 mg. ), Disp: 270 tablet, Rfl: 1 .  celecoxib (CELEBREX) 200 MG capsule, TAKE 1 CAPSULE(200 MG) BY MOUTH DAILY, Disp: 30 capsule, Rfl: 0 .  Cholecalciferol (VITAMIN D3) 5000 units TABS, Take 5,000 Units by mouth  daily., Disp: , Rfl:  .  clonazePAM (KLONOPIN) 0.5 MG tablet, Take 1 tablet (0.5 mg total) by mouth 2 (two) times daily as needed. for anxiety, Disp: 60 tablet, Rfl: 5 .  clopidogrel (PLAVIX) 75 MG tablet, Take 1 tablet (75 mg total) by mouth daily., Disp: 90 tablet, Rfl: 1 .  desonide (DESOWEN) 0.05 % cream, Apply 1  application topically daily as needed (rosacea). , Disp: , Rfl:  .  fentaNYL (DURAGESIC - DOSED MCG/HR) 50 MCG/HR, Place 50 mcg onto the skin every 3 (three) days., Disp: , Rfl:  .  furosemide (LASIX) 20 MG tablet, TAKE 1 TABLET BY MOUTH TWICE DAILY, Disp: 60 tablet, Rfl: 1 .  gabapentin (NEURONTIN) 400 MG capsule, 1 q am, 2 qhs, Disp: 270 capsule, Rfl: 1 .  glucose blood (ACCU-CHEK AVIVA PLUS) test strip, 1 each by Other route as needed for other. Use as instructed. Dx: E11.51, Disp: 100 each, Rfl: 6 .  IRON PO, Take 1 capsule by mouth daily., Disp: , Rfl:  .  JANUVIA 100 MG tablet, TAKE 1 TABLET BY MOUTH EVERY DAY, Disp: 90 tablet, Rfl: 1 .  losartan (COZAAR) 25 MG tablet, TAKE 1 TABLET DAILY, Disp: 90 tablet, Rfl: 0 .  Multiple Vitamin (MULTIVITAMIN WITH MINERALS) TABS tablet, Take 1 tablet by mouth daily., Disp: , Rfl:  .  nitroGLYCERIN (NITROSTAT) 0.4 MG SL tablet, Place 0.4 mg under the tongue every 5 (five) minutes as needed for chest pain., Disp: , Rfl:  .  pantoprazole (PROTONIX) 40 MG tablet, Take 1 tablet (40 mg total) by mouth 2 (two) times daily., Disp: 30 tablet, Rfl: 3 .  potassium chloride (K-DUR) 10 MEQ tablet, Take 1 tablet (10 mEq total) by mouth daily., Disp: , Rfl:  .  rOPINIRole (REQUIP) 1 MG tablet, TAKE 2 TABLETS (=2MG) AT   BEDTIME, Disp: 180 tablet, Rfl: 1 .  tamsulosin (FLOMAX) 0.4 MG CAPS capsule, Take 1 capsule (0.4 mg total) by mouth 2 (two) times daily., Disp: 180 capsule, Rfl: 0 .  cyclobenzaprine (FLEXERIL) 10 MG tablet, TAKE 1 TABLET(10 MG) BY MOUTH THREE TIMES DAILY AS NEEDED FOR MUSCLE SPASMS (Patient not taking: Reported on 06/07/2019), Disp: 30 tablet, Rfl: 0 .  sertraline (ZOLOFT) 50 MG tablet, Take 1 tablet (50 mg total) by mouth daily., Disp: 30 tablet, Rfl: 1 .  silver sulfADIAZINE (SILVADENE) 1 % cream, Apply 1 application topically daily. Apply to affected area daily (Patient not taking: Reported on 06/07/2019), Disp: 450 g, Rfl: 0 .  zonisamide (ZONEGRAN)  50 MG capsule, TAKE 3 CAPSULES(150 MG) BY MOUTH AT BEDTIME (Patient not taking: Reported on 06/07/2019), Disp: 270 capsule, Rfl: 1 Medication Side Effects: none  Family Medical/ Social History: Changes? No  MENTAL HEALTH EXAM:  There were no vitals taken for this visit.There is no height or weight on file to calculate BMI.  General Appearance: unable to assess  Eye Contact:  unable to assess  Speech:  Clear and Coherent  Volume:  Normal  Mood:  Euthymic  Affect:  unable to assess  Thought Process:  Goal Directed and Descriptions of Associations: Intact  Orientation:  Full (Time, Place, and Person)  Thought Content: Logical   Suicidal Thoughts:  No  Homicidal Thoughts:  No  Memory:  WNL  Judgement:  Good  Insight:  Good  Psychomotor Activity:  unable to assess  Concentration:  Concentration: Good  Recall:  Good  Fund of Knowledge: Good  Language: Good  Assets:  Desire for Improvement  ADL's:  Intact  Cognition: WNL  Prognosis:  Good    DIAGNOSES:    ICD-10-CM   1. Major depressive disorder, recurrent episode, moderate (HCC)  F33.1   2. RLS (restless legs syndrome)  G25.81   3. Generalized anxiety disorder  F41.1   4. Restless leg syndrome  G25.81     Receiving Psychotherapy: No    RECOMMENDATIONS: PDMP reviewed. Start Zoloft 50 mg 1 p.o. daily.  We discussed benefits, risks, side effects and he accepts. Continue Wellbutrin XL 300 mg daily. Continue Klonopin 0.5 mg 1 twice daily as needed. Continue gabapentin 400 mg 1 every morning and 2 nightly. Continue ropinirole 2 mg nightly. Return in 4 to 6 weeks.   Donnal Moat, PA-C

## 2019-06-25 ENCOUNTER — Other Ambulatory Visit: Payer: Self-pay | Admitting: Nurse Practitioner

## 2019-06-25 NOTE — Telephone Encounter (Signed)
Allergy/contraindications.   Also, patient has not been seen since 07/2018 and does not have an appointment scheduled.

## 2019-06-26 ENCOUNTER — Other Ambulatory Visit: Payer: Self-pay | Admitting: Nurse Practitioner

## 2019-06-26 ENCOUNTER — Telehealth: Payer: Self-pay | Admitting: *Deleted

## 2019-06-26 NOTE — Telephone Encounter (Signed)
[facility] called and left message on Clinical Intake stating that patient needs a refill on Tamsulosin.  We haven't seen patient in almost a year and several calls have been placed to patient unable to make contact.  I tried calling patient today to see if he had transferred care elsewhere and was unable to speak with him. Rings and then picks up and hangs up. LMOM with pharmacy that patient would need an appointment.

## 2019-07-17 ENCOUNTER — Other Ambulatory Visit: Payer: Self-pay | Admitting: Physician Assistant

## 2019-07-17 ENCOUNTER — Other Ambulatory Visit: Payer: Self-pay | Admitting: Nurse Practitioner

## 2019-07-17 ENCOUNTER — Other Ambulatory Visit: Payer: Self-pay | Admitting: Cardiology

## 2019-07-17 NOTE — Telephone Encounter (Signed)
Should have refills already on file, can you check please?

## 2019-07-18 NOTE — Telephone Encounter (Signed)
Talked with pt. And he will pick up from his pharmacy. He stated he has never used this [facility] before. He uses the one on [address].

## 2019-07-18 NOTE — Telephone Encounter (Signed)
Spoke with pharmacy and they stated that he does have one on file but it is at a different [facility] and he has to go there and pick it up. I tried calling him but there was no answer and the VM is not set up. I will try him again later.

## 2019-08-05 ENCOUNTER — Other Ambulatory Visit: Payer: Self-pay | Admitting: Physician Assistant

## 2019-08-16 ENCOUNTER — Telehealth: Payer: Self-pay | Admitting: Physician Assistant

## 2019-08-16 ENCOUNTER — Other Ambulatory Visit: Payer: Self-pay

## 2019-08-16 MED ORDER — ROPINIROLE HCL 1 MG PO TABS: ORAL_TABLET | ORAL | 0 refills | Status: DC

## 2019-08-16 NOTE — Telephone Encounter (Signed)
Patient left a message that he needs arefill on his medicine for restless leg sydrome. He said he would run out before he sees teresa on the 7th of January. [facility] [address]

## 2019-08-16 NOTE — Telephone Encounter (Signed)
Rx for Ropinirole 1 mg take 2 tablets at hs submitted to pharmacy

## 2019-08-23 ENCOUNTER — Ambulatory Visit (INDEPENDENT_AMBULATORY_CARE_PROVIDER_SITE_OTHER): Payer: No Typology Code available for payment source | Admitting: Physician Assistant

## 2019-08-23 ENCOUNTER — Encounter: Payer: Self-pay | Admitting: Physician Assistant

## 2019-08-23 ENCOUNTER — Other Ambulatory Visit: Payer: Self-pay

## 2019-08-23 DIAGNOSIS — F331 Major depressive disorder, recurrent, moderate: Secondary | ICD-10-CM | POA: Diagnosis not present

## 2019-08-23 DIAGNOSIS — F22 Delusional disorders: Secondary | ICD-10-CM

## 2019-08-23 DIAGNOSIS — G2581 Restless legs syndrome: Secondary | ICD-10-CM | POA: Diagnosis not present

## 2019-08-23 DIAGNOSIS — F411 Generalized anxiety disorder: Secondary | ICD-10-CM | POA: Diagnosis not present

## 2019-08-23 MED ORDER — BUPROPION HCL ER (XL) 150 MG PO TB24: 300.0000 mg | ORAL_TABLET | Freq: Every day | ORAL | 0 refills | Status: DC

## 2019-08-23 MED ORDER — GABAPENTIN 400 MG PO CAPS: ORAL_CAPSULE | ORAL | 0 refills | Status: DC
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
## 2019-10-23 ENCOUNTER — Other Ambulatory Visit: Payer: Self-pay

## 2019-10-23 ENCOUNTER — Ambulatory Visit (INDEPENDENT_AMBULATORY_CARE_PROVIDER_SITE_OTHER): Payer: No Typology Code available for payment source | Admitting: Physician Assistant

## 2019-10-23 ENCOUNTER — Encounter: Payer: Self-pay | Admitting: Physician Assistant

## 2019-10-23 DIAGNOSIS — F22 Delusional disorders: Secondary | ICD-10-CM

## 2019-10-23 DIAGNOSIS — G2581 Restless legs syndrome: Secondary | ICD-10-CM | POA: Diagnosis not present

## 2019-10-23 DIAGNOSIS — F4310 Post-traumatic stress disorder, unspecified: Secondary | ICD-10-CM

## 2019-10-23 DIAGNOSIS — F411 Generalized anxiety disorder: Secondary | ICD-10-CM | POA: Diagnosis not present

## 2019-10-23 DIAGNOSIS — F331 Major depressive disorder, recurrent, moderate: Secondary | ICD-10-CM | POA: Diagnosis not present

## 2019-10-23 MED ORDER — ARIPIPRAZOLE 2 MG PO TABS: 2.0000 mg | ORAL_TABLET | ORAL | 1 refills | Status: DC
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
## 2019-11-13 ENCOUNTER — Emergency Department (HOSPITAL_BASED_OUTPATIENT_CLINIC_OR_DEPARTMENT_OTHER): Payer: Medicare Other

## 2019-11-13 ENCOUNTER — Encounter (HOSPITAL_BASED_OUTPATIENT_CLINIC_OR_DEPARTMENT_OTHER): Payer: Self-pay | Admitting: Emergency Medicine

## 2019-11-13 ENCOUNTER — Emergency Department (HOSPITAL_BASED_OUTPATIENT_CLINIC_OR_DEPARTMENT_OTHER)
Admission: EM | Admit: 2019-11-13 | Discharge: 2019-11-14 | Disposition: A | Discharge status: Other Acute Inpatient Hospital | Payer: Medicare Other | Attending: Emergency Medicine | Admitting: Emergency Medicine

## 2019-11-13 ENCOUNTER — Other Ambulatory Visit: Payer: Self-pay | Admitting: Physician Assistant

## 2019-11-13 ENCOUNTER — Other Ambulatory Visit: Payer: Self-pay

## 2019-11-13 DIAGNOSIS — E1142 Type 2 diabetes mellitus with diabetic polyneuropathy: Secondary | ICD-10-CM | POA: Insufficient documentation

## 2019-11-13 DIAGNOSIS — I214 Non-ST elevation (NSTEMI) myocardial infarction: Secondary | ICD-10-CM | POA: Diagnosis not present

## 2019-11-13 DIAGNOSIS — R079 Chest pain, unspecified: Secondary | ICD-10-CM | POA: Diagnosis present

## 2019-11-13 DIAGNOSIS — I10 Essential (primary) hypertension: Secondary | ICD-10-CM | POA: Insufficient documentation

## 2019-11-13 DIAGNOSIS — Z20822 Contact with and (suspected) exposure to covid-19: Secondary | ICD-10-CM | POA: Diagnosis not present

## 2019-11-13 DIAGNOSIS — Z87891 Personal history of nicotine dependence: Secondary | ICD-10-CM | POA: Diagnosis not present

## 2019-11-13 LAB — CBC WITH DIFFERENTIAL/PLATELET
Abs Immature Granulocytes: 0.04 10*3/uL (ref 0.00–0.07)
Basophils Absolute: 0 10*3/uL (ref 0.0–0.1)
Basophils Relative: 1 %
Eosinophils Absolute: 0.4 10*3/uL (ref 0.0–0.5)
Eosinophils Relative: 7 %
HCT: 39.5 % (ref 39.0–52.0)
Hemoglobin: 12.7 g/dL — ABNORMAL LOW (ref 13.0–17.0)
Immature Granulocytes: 1 %
Lymphocytes Relative: 24 %
Lymphs Abs: 1.2 10*3/uL (ref 0.7–4.0)
MCH: 30.1 pg (ref 26.0–34.0)
MCHC: 32.2 g/dL (ref 30.0–36.0)
MCV: 93.6 fL (ref 80.0–100.0)
Monocytes Absolute: 0.7 10*3/uL (ref 0.1–1.0)
Monocytes Relative: 14 %
Neutro Abs: 2.8 10*3/uL (ref 1.7–7.7)
Neutrophils Relative %: 53 %
Platelets: 215 10*3/uL (ref 150–400)
RBC: 4.22 MIL/uL (ref 4.22–5.81)
RDW: 15.3 % (ref 11.5–15.5)
WBC: 5.1 10*3/uL (ref 4.0–10.5)
nRBC: 0 % (ref 0.0–0.2)

## 2019-11-13 LAB — COMPREHENSIVE METABOLIC PANEL
ALT: 17 U/L (ref 0–44)
AST: 26 U/L (ref 15–41)
Albumin: 3.7 g/dL (ref 3.5–5.0)
Alkaline Phosphatase: 91 U/L (ref 38–126)
Anion gap: 10 (ref 5–15)
BUN: 12 mg/dL (ref 8–23)
CO2: 26 mmol/L (ref 22–32)
Calcium: 8.6 mg/dL — ABNORMAL LOW (ref 8.9–10.3)
Chloride: 96 mmol/L — ABNORMAL LOW (ref 98–111)
Creatinine, Ser: 0.86 mg/dL (ref 0.61–1.24)
GFR calc Af Amer: >60 mL/min (ref >60)
GFR calc non Af Amer: >60 mL/min (ref >60)
Glucose, Bld: 191 mg/dL — ABNORMAL HIGH (ref 70–99)
Potassium: 4 mmol/L (ref 3.5–5.1)
Sodium: 132 mmol/L — ABNORMAL LOW (ref 135–145)
Total Bilirubin: 0.8 mg/dL (ref 0.3–1.2)
Total Protein: 7.3 g/dL (ref 6.5–8.1)

## 2019-11-13 LAB — RESPIRATORY PANEL BY RT PCR (FLU A&B, COVID)
Influenza A by PCR: NEGATIVE
Influenza B by PCR: NEGATIVE
SARS Coronavirus 2 by RT PCR: NEGATIVE

## 2019-11-13 LAB — TROPONIN I (HIGH SENSITIVITY)
Troponin I (High Sensitivity): 1068 ng/L (ref <18)
Troponin I (High Sensitivity): 1131 ng/L (ref <18)

## 2019-11-13 MED ORDER — HEPARIN BOLUS VIA INFUSION
4000.0000 [IU] | Freq: Once | INTRAVENOUS | Status: AC
  Administered 2019-11-13: 4000 [IU] via INTRAVENOUS

## 2019-11-13 MED ORDER — CLOPIDOGREL BISULFATE 300 MG PO TABS
300.0000 mg | ORAL_TABLET | Freq: Once | ORAL | Status: AC
  Administered 2019-11-13: 300 mg via ORAL
  Filled 2019-11-13: qty 1

## 2019-11-13 MED ORDER — ASPIRIN 81 MG PO CHEW
324.0000 mg | CHEWABLE_TABLET | Freq: Once | ORAL | Status: AC
  Administered 2019-11-13: 324 mg via ORAL
  Filled 2019-11-13: qty 4

## 2019-11-13 MED ORDER — HEPARIN (PORCINE) 25000 UT/250ML-% IV SOLN
1200.0000 [IU]/h | INTRAVENOUS | Status: DC
  Administered 2019-11-13: 1200 [IU]/h via INTRAVENOUS
  Filled 2019-11-13: qty 250

## 2019-11-13 MED ORDER — NITROGLYCERIN 2 % TD OINT
0.5000 [in_us] | TOPICAL_OINTMENT | Freq: Once | TRANSDERMAL | Status: AC
  Administered 2019-11-13: 0.5 [in_us] via TOPICAL
  Filled 2019-11-13: qty 1

## 2019-11-13 NOTE — ED Provider Notes (Signed)
  Physical Exam  BP 137/71   Pulse 80   Temp 98.2 F (36.8 C) (Oral)   Resp 14   Ht 6' (1.829 m)   Wt 95.3 kg   SpO2 97%   BMI 28.48 kg/m   Physical Exam  ED Course/Procedures     Procedures  MDM   Assuming care of patient from Dr. Laverta Baltimore.   Patient in the ED for nagging chest pain x 2 days. Workup thus far shows : elevated hs-tn. Pt has hx of CAD.  Concerning findings are as following : elevated hs-tn. Important pending results are : none.  According to Dr. Laverta Baltimore, plan is to transfer [facility] to [facility].   Patient had no complains, no concerns from the nursing side. Will continue to monitor.      Varney Biles, MD 11/13/19 2340

## 2019-11-13 NOTE — Progress Notes (Signed)
ANTICOAGULATION CONSULT NOTE - Initial Consult  Pharmacy Consult for heparin Indication: chest pain/ACS  Allergies  Allergen Reactions  . Ivp Dye [Iodinated Diagnostic Agents]     Blood Pressure dropped----pt was pre-medicated with 13 hour prep and did fine with pre-meds--amy 03/09/11   . Penicillins Nausea Only    REACTION: stomach pain(IV ok) Has patient had a PCN reaction causing immediate rash, facial/tongue/throat swelling, SOB or lightheadedness with hypotension: No Has patient had a PCN reaction causing severe rash involving mucus membranes or skin necrosis: No Has patient had a PCN reaction that required hospitalization: No Has patient had a PCN reaction occurring within the last 10 years: No If all of the above answers are "NO", then may proceed with Cephalosporin use.   . Sulfa Antibiotics Hives  . Sulfonamide Derivatives Hives and Itching  . Lisinopril Cough  . Statins Other (See Comments)    myalgias  . Propoxyphene N-Acetaminophen Other (See Comments)    Sharp pains- headache    Patient Measurements: Height: 6' (182.9 cm) Weight: 210 lb (95.3 kg) IBW/kg (Calculated) : 77.6 Heparin Dosing Weight: TBW  Vital Signs: Temp: 98.2 F (36.8 C) (03/30 1709) Temp Source: Oral (03/30 1709) BP: 127/65 (03/30 1811) Pulse Rate: 72 (03/30 1811)  Labs: Recent Labs    11/13/19 1711  HGB 12.7*  HCT 39.5  PLT 215  CREATININE 0.86  TROPONINIHS 1,068*    Estimated Creatinine Clearance: 91.6 mL/min (by C-G formula based on SCr of 0.86 mg/dL).   Medical History: Past Medical History:  Diagnosis Date  . Anemia    NOS  . Anxiety   . 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 BPH (benign prostatic hypertrophy)   . Broken foot Oct. 12, 2013   Right foot Fx  . CAD (coronary artery disease)   . Carotid stenosis, left   . Cellulitis and abscess of leg 03/2018   right leg  . Chronic hyponatremia   . Chronic pain syndrome   . COPD (chronic obstructive  pulmonary disease) ([place])   . Depression   . Diabetes mellitus   . Diabetic foot ulcer ([place])   . DJD (degenerative joint disease)    L wrist  . DM2 (diabetes mellitus, type 2) ([place])   . GERD (gastroesophageal reflux disease)   . HLD (hyperlipidemia)   . HTN (hypertension)   . Hyponatremia   . Lumbar disc disease   . Osteoporosis    tx per [facility], Dr Prudencio Burly, thought due to heavy steriod use after 1978  . Peripheral neuropathy   . Peripheral vascular disease ([place])   . Spine fracture    hx, multiple  . Varicose veins   . 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 Vitamin D deficiency    Assessment: 17 YOM presenting with CP, SOB.  Not on anticoagulation PTA, H/H low but chronic, plts wnl.    Goal of Therapy:  Heparin level 0.3-0.7 units/ml Monitor platelets by anticoagulation protocol: Yes   Plan:  Heparin 4000 units IV x 1, and gtt at 1200 units/hr F/u 8 hour heparin level  Bertis Ruddy, PharmD Clinical Pharmacist ED Pharmacist Phone # [phone] 11/13/2019 6:22 PM

## 2019-11-13 NOTE — ED Provider Notes (Signed)
Emergency Department Provider Note   I have reviewed the triage vital signs and the nursing notes.   HISTORY  Chief Complaint Chest Pain   HPI [name] is a 74 y.o. male with PMH of PAD, CAD s/p CABG followed by Cardiology at [facility], and COPD presents to the emergency department with 3 days of chest discomfort with shortness of breath and diaphoresis.  Symptoms were most severe 3 days ago when he was trying to bring in his trash cans.  He describes pain/pressure over his entire chest with sweating and shortness of breath.  He had to rest but the pain did not resolve.  He got in the car to drive and get help but thinks he may have either passed out or fallen asleep because he woke up there the next morning.  His chest discomfort had improved although continues to linger at a lower level.  He feels like he cannot get a good deep breath but denies pleuritic chest pain.  He has had some diaphoresis as well.  He called his cardiologist at [facility] today and was advised to present to the ED for evaluation.  He has had a heart attack within the last year requiring CABG. He took an aspirin on Sunday and Monday but not today.  He could not find his nitroglycerin prescription and so has not taken anything for his chest discomfort. No fever/chills.   Past Medical History:  Diagnosis Date  . Anemia    NOS  . Anxiety   . 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 BPH (benign prostatic hypertrophy)   . Broken foot Oct. 12, 2013   Right foot Fx  . CAD (coronary artery disease)   . Carotid stenosis, left   . Cellulitis and abscess of leg 03/2018   right leg  . Chronic hyponatremia   . Chronic pain syndrome   . COPD (chronic obstructive pulmonary disease) ([place])   . Depression   . Diabetes mellitus   . Diabetic foot ulcer ([place])   . DJD (degenerative joint disease)    L wrist  . DM2 (diabetes mellitus, type 2) ([place])   . GERD (gastroesophageal reflux disease)   . HLD  (hyperlipidemia)   . HTN (hypertension)   . Hyponatremia   . Lumbar disc disease   . Osteoporosis    tx per [facility], Dr Prudencio Burly, thought due to heavy steriod use after 1978  . Peripheral neuropathy   . Peripheral vascular disease ([place])   . Spine fracture    hx, multiple  . Varicose veins   . 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 Vitamin D deficiency     Patient Active Problem List   Diagnosis Date Noted  . Major depressive disorder, recurrent episode, moderate ([place]) 07/03/2018  . Generalized anxiety disorder 07/03/2018  . Restless leg syndrome 07/03/2018  . Paranoia ([place]) 07/03/2018  . PTSD (post-traumatic stress disorder) 05/29/2018  . Abscess of right lower leg   . Severe protein-calorie malnutrition ([place])   . 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 Diabetic polyneuropathy associated with type 2 diabetes mellitus ([place])   . Cellulitis 04/01/2018  . Cellulitis of right lower extremity 04/01/2018  . Tachycardia 04/01/2018  . 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 Unstable angina ([place]) 09/20/2015  . Chest pain, rule out acute myocardial infarction 09/18/2015  . Diabetes mellitus type 2 with peripheral artery  disease ([place]) 09/18/2015  . Hyponatremia 09/18/2015  . Chest pain on exertion 09/18/2015  . Varicose veins of bilateral lower extremities with other complications 78/58/8502  . Occlusion and stenosis of carotid artery without mention of cerebral infarction 10/18/2011  . SINUSITIS, ACUTE 03/13/2010  . BACK PAIN 11/13/2009  . FATIGUE 10/17/2009  . Diabetic neuropathy associated with diabetes mellitus due to underlying condition ([place]) 08/29/2009  . Vitamin D deficiency 08/29/2009  . Hyposmolality and/or hyponatremia 08/29/2009  . VISUAL ACUITY, DECREASED, LEFT EYE 08/29/2009  . PERIPHERAL VASCULAR DISEASE 08/29/2009  . SINUSITIS, CHRONIC 08/29/2009  . NEPHROLITHIASIS 08/29/2009  . FIBROMYALGIA 08/29/2009  . BICEPS TENDON  RUPTURE, RIGHT 08/11/2009  . DIABETES MELLITUS, TYPE II 08/05/2009  . Hyperlipidemia 08/05/2009  . ANEMIA-NOS 08/05/2009  . ANXIETY 08/05/2009  . DEPRESSION 08/05/2009  . Chronic pain syndrome 08/05/2009  . Essential hypertension 08/05/2009  . GERD 08/05/2009  . BENIGN PROSTATIC HYPERTROPHY 08/05/2009  . [place] DISEASE, LUMBAR 08/05/2009  . Osteoporosis 08/05/2009    Past Surgical History:  Procedure Laterality Date  . ANGIOPLASTY    . aorta bifemoral bypass grafting  09/2010  . CARDIAC CATHETERIZATION N/A 09/19/2015   Procedure: Left Heart Cath and Coronary Angiography;  Surgeon: Adrian Prows, MD;  Location: [facility];  Service: Cardiovascular;  Laterality: N/A;  . CARPAL TUNNEL RELEASE     right 2006/ left 2007  . CATARACT EXTRACTION     left 1996/ right 1997  . CORONARY ARTERY BYPASS GRAFT N/A 09/22/2015   Procedure: CORONARY ARTERY BYPASS GRAFTING (CABG) times four using the right greater saphenous vein harvested endoscopically and the left internal mammary artery.  LIMA-LAD, SEQ SVG-DIAG & OM, SVG-PD.;  Surgeon: [name], MD;  Location: [place];  Service: Open Heart Surgery;  Laterality: N/A;  . I & D EXTREMITY Right 04/07/2018   Procedure: IRRIGATION AND DEBRIDEMENT ABSCESS RIGHT LEG;  Surgeon: Newt Minion, MD;  Location: [place];  Service: Orthopedics;  Laterality: Right;  . L foot open repair jones fracture  2010    5th metetarsal   . left ankle ganglion cyst  1976  . left carotid endarterectomy  03/2011  . left cataract  1996   right - 1997  . left CTS  2006   R CTS - 2007  . left foot surgery  1998   R surgery/fracture - 1999  . left plantar ganglion cystectomy  1979  . left wrist/hand fusion  2008  . [place], 2004  . NASAL SINUS SURGERY     multiple- x8. last 1997 with obliteration  . REPAIR THORACIC AORTA  2017  . right hand fracture  1969  . ROTATOR CUFF REPAIR  2006   R, than repeat 2011, Dr Theda Sers  . TRIGGER FINGER RELEASE Left  11/11/2014   Procedure: LEFT Verlee Pope FINGER RELEASE TRIGGER FINGER/A-1 PULLEY;  Surgeon: Milly Jakob, MD;  Location: [place];  Service: Orthopedics;  Laterality: Left;  . vocal surgery  1996    Allergies Ivp dye [iodinated diagnostic agents], Penicillins, Sulfa antibiotics, Sulfonamide derivatives, Lisinopril, Statins, and Propoxyphene n-acetaminophen  Family History  Problem Relation Age of Onset  . Lung cancer Father 36  . Diabetes Mother 96  . Osteoporosis Mother   . Depression Mother   . Bleeding Disorder Mother   . Throat cancer Other        family hx - "bad living" - also lung CA, heart disease and renal failure    . Diabetes  Sister   . Suicidality Son 72  . Lung cancer Maternal Grandmother 44  . Diabetes Maternal Grandfather 34  . Heart disease Maternal Grandfather   . Heart attack Paternal Grandmother   . Brain cancer Paternal Grandfather 40    Social History Social History   Tobacco Use  . Smoking status: Former Smoker    Packs/day: 1.50    Years: 30.00    Pack years: 45.00    Types: Cigarettes    Quit date: 11/06/1988    Years since quitting: 31.0  . Smokeless tobacco: Never Used  Substance Use Topics  . Alcohol use: Not Currently    Comment: None in 30 years but drank beer before  . Drug use: No    Review of Systems  Constitutional: No fever/chills Eyes: No visual changes. ENT: No sore throat. Cardiovascular: Positive chest pain and diaphoresis.  Respiratory: Positive shortness of breath. Gastrointestinal: No abdominal pain.  No nausea, no vomiting.  No diarrhea.  No constipation. Genitourinary: Negative for dysuria. Musculoskeletal: Negative for back pain. Skin: Negative for rash. Neurological: Negative for headaches, focal weakness or numbness.  10-point ROS otherwise negative.  ____________________________________________   PHYSICAL EXAM:  VITAL SIGNS: ED Triage Vitals  Enc Vitals Group     BP 11/13/19 1709 96/71      Pulse Rate 11/13/19 1709 80     Resp 11/13/19 1709 16     Temp 11/13/19 1709 98.2 F (36.8 C)     Temp Source 11/13/19 1709 Oral     SpO2 11/13/19 1709 100 %     Weight 11/13/19 1704 210 lb (95.3 kg)     Height 11/13/19 1704 6' (1.829 m)   Constitutional: Alert and oriented. Well appearing and in no acute distress. Eyes: Conjunctivae are normal.  Head: Atraumatic. Nose: No congestion/rhinnorhea. Mouth/Throat: Mucous membranes are moist.   Neck: No stridor.  Cardiovascular: Normal rate, regular rhythm. Good peripheral circulation. Grossly normal heart sounds.   Respiratory: Normal respiratory effort.  No retractions. Lungs CTAB. No wheezing.  Gastrointestinal: Soft and nontender. No distention.  Musculoskeletal: Pitting edema LLE (baseline per patient since surgery). No gross deformities of extremities. Neurologic:  Normal speech and language. Skin:  Skin is warm, dry and intact. No rash noted.   ____________________________________________   LABS (all labs ordered are listed, but only abnormal results are displayed)  Labs Reviewed  COMPREHENSIVE METABOLIC PANEL - Abnormal; Notable for the following components:      Result Value   Sodium 132 (*)    Chloride 96 (*)    Glucose, Bld 191 (*)    Calcium 8.6 (*)    All other components within normal limits  CBC WITH DIFFERENTIAL/PLATELET - Abnormal; Notable for the following components:   Hemoglobin 12.7 (*)    All other components within normal limits  TROPONIN I (HIGH SENSITIVITY) - Abnormal; Notable for the following components:   Troponin I (High Sensitivity) 1,068 (*)    All other components within normal limits  TROPONIN I (HIGH SENSITIVITY) - Abnormal; Notable for the following components:   Troponin I (High Sensitivity) 1,131 (*)    All other components within normal limits  RESPIRATORY PANEL BY RT PCR (FLU A&B, COVID)   ____________________________________________  EKG   EKG Interpretation  Date/Time:  Tuesday  November 13 2019 17:04:42 EDT Ventricular Rate:  87 PR Interval:    QRS Duration: 131 QT Interval:  397 QTC Calculation: 478 R Axis:   80 Text Interpretation: Sinus arrhythmia Paired ventricular premature complexes  Consider left atrial enlargement Nonspecific intraventricular conduction delay Nonspecific repol abnormality, diffuse leads Similar to Sep 2019 tracing No STEMI Confirmed by Nanda Quinton [phone]) on 11/13/2019 5:06:15 PM       ____________________________________________  RADIOLOGY  DG Chest Portable 1 View  Result Date: 11/13/2019 CLINICAL DATA:  Chest pain, short of breath, diaphoresis, coronary artery disease EXAM: PORTABLE CHEST 1 VIEW COMPARISON:  05/19/2019 FINDINGS: Single frontal view of the chest demonstrates postsurgical changes from CABG. Cardiac silhouette is enlarged but stable. There is chronic central vascular congestion, with increased interstitial prominence since prior study. No airspace disease, effusion, or pneumothorax. IMPRESSION: 1. Findings compatible with interstitial edema. Electronically Signed   By: Randa Ngo M.D.   On: 11/13/2019 17:52    ____________________________________________   PROCEDURES  Procedure(s) performed:   Procedures  CRITICAL CARE Performed by: Margette Fast Total critical care time: 35 minutes Critical care time was exclusive of separately billable procedures and treating other patients. Critical care was necessary to treat or prevent imminent or life-threatening deterioration. Critical care was time spent personally by me on the following activities: development of treatment plan with patient and/or surrogate as well as nursing, discussions with consultants, evaluation of patient's response to treatment, examination of patient, obtaining history from patient or surrogate, ordering and performing treatments and interventions, ordering and review of laboratory studies, ordering and review of radiographic studies, pulse  oximetry and re-evaluation of patient's condition.  Nanda Quinton, MD Emergency Medicine  ____________________________________________   INITIAL IMPRESSION / ASSESSMENT AND PLAN / ED COURSE  Pertinent labs & imaging results that were available during my care of the patient were reviewed by me and considered in my medical decision making (see chart for details).   Patient presents to the emergency department for evaluation of chest pain.  He has a complicated cardiac and PAD history including CABG and NSTEMI at [facility] on 04/2019 with left heart cath then with occluded RCA and unsuccessful PCI of proximal circumflex. Medical mgmt was optimized and he was ultimately discharge to SNF and then home. He lives in [place]. He is having mild active pressure in the chest. No pleuritic pain to suspect PE.   Patient's troponin is 1068.  I am ordering heparin and have discussed the results with the patient.  His preference would be to stay with his cardiology team at [facility] if possible.  Will call them for transfer.  06:30 PM  Spoke with the [facility] transfer center.  They will get the cardiologist on the line for me to discuss the case and decide on transfer versus admission to [facility].  Patient is not having chest pain currently.  Vital signs are remaining within normal limits.   07:59 PM  After following up with [facility] transfer center.  They are attempting to get the consult out but at this time are experiencing very high volumes and do not have bed availability.  Will discuss with our cardiology team regarding admission.   12:34 PM Spoke with the [facility] transfer center.  They are happy to accept the patient. Dr. Posey Pronto is accepting. They are recommending 300 mg Plavix load while waiting on transport.  They are checking on their inpatient bed status and if they are not able to place the patient soon they would facilitate an ED to ED transfer to [facility] where they can further evaluate and likely cath in the morning.  We  will keep the patient NPO.  ____________________________________________  FINAL CLINICAL IMPRESSION(S) / ED DIAGNOSES  Final diagnoses:  NSTEMI (non-ST elevated myocardial infarction) [facility])     MEDICATIONS GIVEN DURING THIS VISIT:  Medications  aspirin chewable tablet 324 mg (324 mg Oral Given 11/13/19 1731)  nitroGLYCERIN (NITROGLYN) 2 % ointment 0.5 inch (0.5 inches Topical Given 11/13/19 1731)  heparin bolus via infusion 4,000 Units (4,000 Units Intravenous Bolus from Bag 11/13/19 1835)  clopidogrel (PLAVIX) tablet 300 mg (300 mg Oral Given 11/13/19 2038)    Note:  This document was prepared using Dragon voice recognition software and may include unintentional dictation errors.  Nanda Quinton, MD, [facility] Emergency Medicine    Sadie Pickar, [facility], MD 11/14/19 [phone]

## 2019-11-13 NOTE — ED Triage Notes (Signed)
Pt c/o CP, sob and diaphoresis x 3 days. Pt denies fever, endorses hx of cardiac bypass, denies N/V

## 2019-11-19 ENCOUNTER — Telehealth: Payer: Self-pay | Admitting: Physician Assistant

## 2019-11-19 NOTE — Telephone Encounter (Signed)
Noted  

## 2019-11-19 NOTE — Telephone Encounter (Signed)
Pt left v-mail stating he is in [facility] and to cancel appointment 4/7. Will call back to schedule.

## 2019-11-21 ENCOUNTER — Ambulatory Visit: Payer: Medicare Other | Admitting: Physician Assistant

## 2019-11-23 ENCOUNTER — Emergency Department (HOSPITAL_COMMUNITY): Payer: Medicare Other

## 2019-11-23 ENCOUNTER — Emergency Department (HOSPITAL_COMMUNITY)
Admission: EM | Admit: 2019-11-23 | Discharge: 2019-11-23 | Disposition: A | Discharge status: Home / Self Care | Payer: Medicare Other | Attending: Emergency Medicine | Admitting: Emergency Medicine

## 2019-11-23 ENCOUNTER — Other Ambulatory Visit: Payer: Self-pay

## 2019-11-23 ENCOUNTER — Encounter (HOSPITAL_COMMUNITY): Payer: Self-pay | Admitting: *Deleted

## 2019-11-23 DIAGNOSIS — E119 Type 2 diabetes mellitus without complications: Secondary | ICD-10-CM | POA: Diagnosis not present

## 2019-11-23 DIAGNOSIS — Z79899 Other long term (current) drug therapy: Secondary | ICD-10-CM | POA: Insufficient documentation

## 2019-11-23 DIAGNOSIS — R0602 Shortness of breath: Secondary | ICD-10-CM | POA: Diagnosis not present

## 2019-11-23 DIAGNOSIS — G8929 Other chronic pain: Secondary | ICD-10-CM | POA: Diagnosis not present

## 2019-11-23 DIAGNOSIS — Z87891 Personal history of nicotine dependence: Secondary | ICD-10-CM | POA: Insufficient documentation

## 2019-11-23 DIAGNOSIS — I10 Essential (primary) hypertension: Secondary | ICD-10-CM | POA: Diagnosis not present

## 2019-11-23 DIAGNOSIS — R52 Pain, unspecified: Secondary | ICD-10-CM

## 2019-11-23 DIAGNOSIS — I2510 Atherosclerotic heart disease of native coronary artery without angina pectoris: Secondary | ICD-10-CM | POA: Diagnosis not present

## 2019-11-23 DIAGNOSIS — Z951 Presence of aortocoronary bypass graft: Secondary | ICD-10-CM | POA: Diagnosis not present

## 2019-11-23 DIAGNOSIS — R0789 Other chest pain: Secondary | ICD-10-CM | POA: Diagnosis present

## 2019-11-23 DIAGNOSIS — R600 Localized edema: Secondary | ICD-10-CM | POA: Diagnosis not present

## 2019-11-23 DIAGNOSIS — I252 Old myocardial infarction: Secondary | ICD-10-CM | POA: Diagnosis not present

## 2019-11-23 DIAGNOSIS — Z7901 Long term (current) use of anticoagulants: Secondary | ICD-10-CM | POA: Insufficient documentation

## 2019-11-23 DIAGNOSIS — J449 Chronic obstructive pulmonary disease, unspecified: Secondary | ICD-10-CM | POA: Diagnosis not present

## 2019-11-23 DIAGNOSIS — M25511 Pain in right shoulder: Secondary | ICD-10-CM | POA: Insufficient documentation

## 2019-11-23 HISTORY — DX: Nonrheumatic mitral (valve) insufficiency: I34.0

## 2019-11-23 LAB — CBC
HCT: 37.4 % — ABNORMAL LOW (ref 39.0–52.0)
Hemoglobin: 12.4 g/dL — ABNORMAL LOW (ref 13.0–17.0)
MCH: 30.7 pg (ref 26.0–34.0)
MCHC: 33.2 g/dL (ref 30.0–36.0)
MCV: 92.6 fL (ref 80.0–100.0)
Platelets: 278 10*3/uL (ref 150–400)
RBC: 4.04 MIL/uL — ABNORMAL LOW (ref 4.22–5.81)
RDW: 15.5 % (ref 11.5–15.5)
WBC: 10.9 10*3/uL — ABNORMAL HIGH (ref 4.0–10.5)
nRBC: 0 % (ref 0.0–0.2)

## 2019-11-23 LAB — BASIC METABOLIC PANEL
Anion gap: 12 (ref 5–15)
BUN: 22 mg/dL (ref 8–23)
CO2: 24 mmol/L (ref 22–32)
Calcium: 8.3 mg/dL — ABNORMAL LOW (ref 8.9–10.3)
Chloride: 93 mmol/L — ABNORMAL LOW (ref 98–111)
Creatinine, Ser: 1.05 mg/dL (ref 0.61–1.24)
GFR calc Af Amer: >60 mL/min (ref >60)
GFR calc non Af Amer: >60 mL/min (ref >60)
Glucose, Bld: 179 mg/dL — ABNORMAL HIGH (ref 70–99)
Potassium: 4.1 mmol/L (ref 3.5–5.1)
Sodium: 129 mmol/L — ABNORMAL LOW (ref 135–145)

## 2019-11-23 LAB — D-DIMER, QUANTITATIVE: D-Dimer, Quant: 3.17 ug/mL-FEU — ABNORMAL HIGH (ref 0.00–0.50)

## 2019-11-23 LAB — TROPONIN I (HIGH SENSITIVITY)
Troponin I (High Sensitivity): 27 ng/L — ABNORMAL HIGH (ref <18)
Troponin I (High Sensitivity): 27 ng/L — ABNORMAL HIGH (ref <18)

## 2019-11-23 LAB — BRAIN NATRIURETIC PEPTIDE: B Natriuretic Peptide: 362.7 pg/mL — ABNORMAL HIGH (ref 0.0–100.0)

## 2019-11-23 MED ORDER — SODIUM CHLORIDE 0.9% FLUSH
3.0000 mL | Freq: Once | INTRAVENOUS | Status: AC
  Administered 2019-11-23: 3 mL via INTRAVENOUS

## 2019-11-23 MED ORDER — FENTANYL CITRATE (PF) 100 MCG/2ML IJ SOLN
50.0000 ug | Freq: Once | INTRAMUSCULAR | Status: AC
  Administered 2019-11-23: 50 ug via INTRAVENOUS
  Filled 2019-11-23: qty 2

## 2019-11-23 MED ORDER — ASPIRIN 81 MG PO TBEC
81.00 | DELAYED_RELEASE_TABLET | ORAL | Status: DC
Start: 2019-11-22 — End: 2019-11-23

## 2019-11-23 MED ORDER — PANTOPRAZOLE SODIUM 20 MG PO TBEC
20.00 | DELAYED_RELEASE_TABLET | ORAL | Status: DC
Start: 2019-11-21 — End: 2019-11-23

## 2019-11-23 MED ORDER — GLUCAGON (RDNA) 1 MG IJ KIT
1.00 | PACK | INTRAMUSCULAR | Status: DC
Start: ? — End: 2019-11-23

## 2019-11-23 MED ORDER — DICLOFENAC SODIUM 1 % EX GEL: 2.0000 g | Freq: Four times a day (QID) | CUTANEOUS | 2 refills | Status: DC

## 2019-11-23 MED ORDER — LIDOCAINE HCL 1 % IJ SOLN
0.50 | INTRAMUSCULAR | Status: DC
Start: ? — End: 2019-11-23

## 2019-11-23 MED ORDER — TAMSULOSIN HCL 0.4 MG PO CAPS
0.40 | ORAL_CAPSULE | ORAL | Status: DC
Start: 2019-11-21 — End: 2019-11-23

## 2019-11-23 MED ORDER — FENTANYL 25 MCG/HR TD PT72
1.00 | MEDICATED_PATCH | TRANSDERMAL | Status: DC
Start: 2019-11-23 — End: 2019-11-23

## 2019-11-23 MED ORDER — GABAPENTIN 400 MG PO CAPS
400.00 | ORAL_CAPSULE | ORAL | Status: DC
Start: 2019-11-21 — End: 2019-11-23

## 2019-11-23 MED ORDER — INSULIN REGULAR HUMAN 100 UNIT/ML IJ SOLN
0.00 | INTRAMUSCULAR | Status: DC
Start: 2019-11-21 — End: 2019-11-23

## 2019-11-23 MED ORDER — ACETAMINOPHEN 500 MG PO TABS
1000.0000 mg | ORAL_TABLET | Freq: Once | ORAL | Status: AC
  Administered 2019-11-23: 1000 mg via ORAL
  Filled 2019-11-23: qty 2

## 2019-11-23 MED ORDER — DEXTROSE 50 % IV SOLN
12.50 | INTRAVENOUS | Status: DC
Start: ? — End: 2019-11-23

## 2019-11-23 MED ORDER — ACETAMINOPHEN 325 MG PO TABS
650.00 | ORAL_TABLET | ORAL | Status: DC
Start: ? — End: 2019-11-23

## 2019-11-23 MED ORDER — ROPINIROLE HCL 2 MG PO TABS
2.00 | ORAL_TABLET | ORAL | Status: DC
Start: 2019-11-21 — End: 2019-11-23

## 2019-11-23 MED ORDER — BUPROPION HCL ER (XL) 150 MG PO TB24
300.00 | ORAL_TABLET | ORAL | Status: DC
Start: 2019-11-22 — End: 2019-11-23

## 2019-11-23 MED ORDER — GENERIC EXTERNAL MEDICATION
30.00 | Status: DC
Start: ? — End: 2019-11-23

## 2019-11-23 MED ORDER — LIDOCAINE 5 % EX PTCH
1.0000 | MEDICATED_PATCH | CUTANEOUS | Status: DC
  Administered 2019-11-23: 1 via TRANSDERMAL
  Filled 2019-11-23: qty 1

## 2019-11-23 MED ORDER — NITROGLYCERIN 0.4 MG SL SUBL
0.40 | SUBLINGUAL_TABLET | SUBLINGUAL | Status: DC
Start: ? — End: 2019-11-23

## 2019-11-23 MED ORDER — SACUBITRIL-VALSARTAN 24-26 MG PO TABS
1.00 | ORAL_TABLET | ORAL | Status: DC
Start: 2019-11-21 — End: 2019-11-23

## 2019-11-23 MED ORDER — GENERIC EXTERNAL MEDICATION
1.00 | Status: DC
Start: ? — End: 2019-11-23

## 2019-11-23 MED ORDER — POLYETHYLENE GLYCOL 3350 17 GM/SCOOP PO POWD
17.00 | ORAL | Status: DC
Start: 2019-11-22 — End: 2019-11-23

## 2019-11-23 MED ORDER — FENTANYL 12 MCG/HR TD PT72
1.00 | MEDICATED_PATCH | TRANSDERMAL | Status: DC
Start: 2019-11-23 — End: 2019-11-23

## 2019-11-23 MED ORDER — PRAVASTATIN SODIUM 20 MG PO TABS
20.00 | ORAL_TABLET | ORAL | Status: DC
Start: 2019-11-21 — End: 2019-11-23

## 2019-11-23 MED ORDER — CARVEDILOL 6.25 MG PO TABS
6.25 | ORAL_TABLET | ORAL | Status: DC
Start: 2019-11-21 — End: 2019-11-23

## 2019-11-23 MED ORDER — ISOSORBIDE MONONITRATE ER 30 MG PO TB24
30.00 | ORAL_TABLET | ORAL | Status: DC
Start: 2019-11-22 — End: 2019-11-23

## 2019-11-23 MED ORDER — TICAGRELOR 90 MG PO TABS
90.00 | ORAL_TABLET | ORAL | Status: DC
Start: 2019-11-21 — End: 2019-11-23

## 2019-11-23 MED ORDER — SENNOSIDES-DOCUSATE SODIUM 8.6-50 MG PO TABS
2.00 | ORAL_TABLET | ORAL | Status: DC
Start: 2019-11-21 — End: 2019-11-23

## 2019-11-23 MED ORDER — LIDOCAINE 5 % EX PTCH: 1.0000 | MEDICATED_PATCH | CUTANEOUS | 0 refills | Status: DC

## 2019-11-23 NOTE — ED Provider Notes (Signed)
[name] Provider Note   CSN: [id number] Arrival date & time: 11/23/19  8309     History Chief Complaint  Patient presents with   Chest Pain    [name] is a 74 y.o. male.  74 y/o male with significant cardiac history with recent admission at [facility] on 11/14/19 s/p 2 unsuccessful attempts for PCI of LM resulting in medical management for severe CAD, Ticagrelor replaced home Plavix and started pravastatin 20 mg, Imdur 30 mg daily, Entresto 24-26 mg BID, and carvedilol in addition to prior CABG, unsuccessful PCI on in 9/20 for RCA, DM, chronic pain on fentanyl patch for years, CHF and chronic hyponatremia presenting today with right shoulder and chest pain.  Patient reports that he was having this right-sided shoulder and arm pain while he was hospitalized but in the last 2 days since he has been home it feels worse.  He reports he cannot even lift his right arm because the pain is too severe.  Certain movements and taking deep breaths make the pain worse.  He also states occasionally his fingers will feel tingly.  He underwent catheterization through his groin they did not do any radial approach.  He has started taking the new medications and continues to wear his fentanyl patches and reports he has had a history of shoulder problems but I had not heard him like this in years.  He reports that he always feels short of breath and cannot say that it is any worse than baseline.  He has been having occasional hemoptysis since he reports seeing anesthesiology but that is improving.  He denies any significant cough, fever.  He has had no abdominal pain, vomiting or diarrhea.  He has had ongoing swelling in his leg right greater than left but reports that he is taking his diuretic.  Patient did take a nitroglycerin on the way here and states maybe it helped a little bit with the pain but he is not sure.   The history is provided by the patient.  Chest  Pain Pain location:  R chest Pain quality: sharp and shooting   Pain radiates to:  R arm Pain severity:  Moderate Onset quality:  Gradual Duration:  5 days Timing:  Constant Progression:  Worsening Chronicity:  New Context: raising an arm   Relieved by:  Nothing Worsened by:  Movement and deep breathing Ineffective treatments:  None tried Associated symptoms: lower extremity edema and shortness of breath   Associated symptoms: no abdominal pain, no cough, no dysphagia, no fever, no nausea, no vomiting and no weakness   Risk factors: coronary artery disease, diabetes mellitus, hypertension and male sex        Past Medical History:  Diagnosis Date   Anemia    NOS   Anxiety    Blindness of left eye    near blindness. 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 12, 2013   Right foot Fx   CAD (coronary artery disease)    Carotid stenosis, left    Cellulitis and abscess of leg 03/2018   right leg   Chronic hyponatremia    Chronic pain syndrome    COPD (chronic obstructive pulmonary disease) (HCC)    Depression    Diabetes mellitus    Diabetic foot ulcer (HCC)    DJD (degenerative joint disease)    L wrist   DM2 (diabetes mellitus, type 2) ([place])    GERD (  gastroesophageal reflux disease)    HLD (hyperlipidemia)    HTN (hypertension)    Hyponatremia    Lumbar disc disease    MI (mitral incompetence)    Osteoporosis    tx per [facility], Dr Prudencio Burly, thought due to heavy steriod use after 1978   Peripheral neuropathy    Peripheral vascular disease ([place])    Spine fracture    hx, multiple   Varicose veins    Visual impairment of left eye    artery occlusion   Vitamin D deficiency     Patient Active Problem List   Diagnosis Date Noted   Major depressive disorder, recurrent episode, moderate ([place]) 07/03/2018   Generalized anxiety disorder 07/03/2018   Restless leg syndrome 07/03/2018   Paranoia ([place]) 07/03/2018    PTSD (post-traumatic stress disorder) 05/29/2018   Abscess of right lower leg    Severe protein-calorie malnutrition (HCC)    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 ([place])    Diabetic polyneuropathy associated with type 2 diabetes mellitus ([place])    Cellulitis 04/01/2018   Cellulitis of right lower extremity 04/01/2018   Tachycardia 04/01/2018   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   Unstable angina (HCC) 09/20/2015   Chest pain, rule out acute myocardial infarction 09/18/2015   Diabetes mellitus type 2 with peripheral artery disease ([place]) 09/18/2015   Hyponatremia 09/18/2015   Chest pain on exertion 09/18/2015   Varicose veins of bilateral lower extremities with other complications 79/89/2119   Occlusion and stenosis of carotid artery without mention of cerebral infarction 10/18/2011   SINUSITIS, ACUTE 03/13/2010   BACK PAIN 11/13/2009   FATIGUE 10/17/2009   Diabetic neuropathy associated with diabetes mellitus due to underlying condition ([place]) 08/29/2009   Vitamin D deficiency 08/29/2009   Hyposmolality and/or hyponatremia 08/29/2009   VISUAL ACUITY, DECREASED, LEFT EYE 08/29/2009   PERIPHERAL VASCULAR DISEASE 08/29/2009   SINUSITIS, CHRONIC 08/29/2009   NEPHROLITHIASIS 08/29/2009   FIBROMYALGIA 08/29/2009   BICEPS TENDON RUPTURE, RIGHT 08/11/2009   DIABETES MELLITUS, TYPE II 08/05/2009   Hyperlipidemia 08/05/2009   ANEMIA-NOS 08/05/2009   ANXIETY 08/05/2009   DEPRESSION 08/05/2009   Chronic pain syndrome 08/05/2009   Essential hypertension 08/05/2009   GERD 08/05/2009   BENIGN PROSTATIC HYPERTROPHY 08/05/2009   Elliott DISEASE, LUMBAR 08/05/2009   Osteoporosis 08/05/2009    Past Surgical History:  Procedure Laterality Date   ANGIOPLASTY     aorta bifemoral bypass grafting  09/2010   CARDIAC CATHETERIZATION N/A 09/19/2015   Procedure: Left Heart Cath and Coronary Angiography;  Surgeon: Adrian Prows, MD;   Location: [facility];  Service: Cardiovascular;  Laterality: N/A;   CARPAL TUNNEL RELEASE     right 2006/ left 2007   CATARACT EXTRACTION     left 1996/ right 1997   CORONARY ARTERY BYPASS GRAFT N/A 09/22/2015   Procedure: CORONARY ARTERY BYPASS GRAFTING (CABG) times four using the right greater saphenous vein harvested endoscopically and the left internal mammary artery.  LIMA-LAD, SEQ SVG-DIAG & OM, SVG-PD.;  Surgeon: [name], MD;  Location: [facility];  Service: Open Heart Surgery;  Laterality: N/A;   I & D EXTREMITY Right 04/07/2018   Procedure: IRRIGATION AND DEBRIDEMENT ABSCESS RIGHT LEG;  Surgeon: Newt Minion, MD;  Location: [facility];  Service: Orthopedics;  Laterality: Right;   L foot open repair jones fracture  2010    5th metetarsal    left ankle ganglion cyst  1976   left carotid endarterectomy  03/2011  left cataract  1996   right - 1997   left CTS  2006   R CTS - 2007   left foot surgery  1998   R surgery/fracture - 1999   left plantar ganglion cystectomy  1979   left wrist/hand fusion  2008   [place], 2004   NASAL SINUS SURGERY     multiple- x8. last 1997 with obliteration   REPAIR THORACIC AORTA  2017   right hand fracture  1969   ROTATOR CUFF REPAIR  2006   R, than repeat 2011, Dr Theda Sers   TRIGGER FINGER RELEASE Left 11/11/2014   Procedure: LEFT LONG FINGER RELEASE TRIGGER FINGER/A-1 PULLEY;  Surgeon: Milly Jakob, MD;  Location: [facility];  Service: Orthopedics;  Laterality: Left;   vocal surgery  1996       Family History  Problem Relation Age of Onset   Lung cancer Father 51   Diabetes Mother 3   Osteoporosis Mother    Depression Mother    Bleeding Disorder Mother    Throat cancer Other        family hx - "bad living" - also lung CA, heart disease and renal failure     Diabetes Sister    Suicidality Son 81   Lung cancer Maternal Grandmother 52   Diabetes Maternal Grandfather  68   Heart disease Maternal Grandfather    Heart attack Paternal Grandmother    Brain cancer Paternal Grandfather 74    Social History   Tobacco Use   Smoking status: Former Smoker    Packs/day: 1.50    Years: 30.00    Pack years: 45.00    Types: Cigarettes    Quit date: 11/06/1988    Years since quitting: 31.0   Smokeless tobacco: Never Used  Substance Use Topics   Alcohol use: Not Currently    Comment: None in 30 years but drank beer before   Drug use: No    Home Medications Prior to Admission medications   Medication Sig Start Date End Date Taking? Authorizing Provider  ACCU-CHEK FASTCLIX LANCETS MISC Use as directed to check blood glucose. Dx: E11.51 01/17/18   [name], NP  albuterol (PROAIR HFA) 108 (90 Base) MCG/ACT inhaler Inhale 2 puffs into the lungs every 6 (six) hours as needed for wheezing or shortness of breath.    [provider]  ARIPiprazole (ABILIFY) 2 MG tablet Take 1 tablet (2 mg total) by mouth every morning. 10/23/19   Donnal Moat T, PA-C  Blood Glucose Monitoring Suppl (ACCU-CHEK AVIVA PLUS) w/Device KIT Use as directed to check blood glucose daily. Dx: E11.51 01/17/18   [name], NP  buPROPion (WELLBUTRIN XL) 150 MG 24 hr tablet Take 2 tablets (300 mg total) by mouth daily. 08/23/19   Donnal Moat T, PA-C  celecoxib (CELEBREX) 200 MG capsule TAKE 1 CAPSULE(200 MG) BY MOUTH DAILY 10/02/18   [name], NP  Cholecalciferol (VITAMIN D3) 5000 units TABS Take 5,000 Units by mouth daily.    [provider]  clonazePAM (KLONOPIN) 0.5 MG tablet Take 1 tablet (0.5 mg total) by mouth 2 (two) times daily as needed. for anxiety 06/07/19   Donnal Moat T, PA-C  clopidogrel (PLAVIX) 75 MG tablet TAKE 1 TABLET BY MOUTH DAILY WITH FOOD 07/17/19   Adrian Prows, MD  cyclobenzaprine (FLEXERIL) 10 MG tablet TAKE 1 TABLET(10 MG) BY MOUTH THREE TIMES DAILY AS NEEDED FOR MUSCLE SPASMS Patient not taking: Reported on 06/07/2019  02/09/19    Adrian Prows, MD  desonide (DESOWEN) 0.05 % cream Apply 1 application topically daily as needed (rosacea).     [provider]  fentaNYL (DURAGESIC - DOSED MCG/HR) 50 MCG/HR Place 50 mcg onto the skin every 3 (three) days.    [provider]  furosemide (LASIX) 20 MG tablet TAKE 1 TABLET BY MOUTH TWICE DAILY 07/17/19   Adrian Prows, MD  gabapentin (NEURONTIN) 400 MG capsule 1 q am, 2 qhs 08/23/19   Hurst, Helene Kelp T, PA-C  glucose blood (ACCU-CHEK AVIVA PLUS) test strip 1 each by Other route as needed for other. Use as instructed. Dx: E11.51 01/17/18   [name], NP  IRON PO Take 1 capsule by mouth daily.    [provider]  JANUVIA 100 MG tablet TAKE 1 TABLET BY MOUTH EVERY DAY 05/19/18   [name], DO  losartan (COZAAR) 25 MG tablet TAKE 1 TABLET DAILY 12/25/18   [name], NP  Multiple Vitamin (MULTIVITAMIN WITH MINERALS) TABS tablet Take 1 tablet by mouth daily.    [provider]  nitroGLYCERIN (NITROSTAT) 0.4 MG SL tablet PLACE 1 TABLET UNDER THE TONGUE EVERY 5 MINUTES AS NEEDED FOR CHEST PAIN 07/17/19   Adrian Prows, MD  pantoprazole (PROTONIX) 40 MG tablet Take 1 tablet (40 mg total) by mouth 2 (two) times daily. Patient not taking: Reported on 08/23/2019 08/30/18   [name], NP  potassium chloride (K-DUR) 10 MEQ tablet Take 1 tablet (10 mEq total) by mouth daily. 06/28/18   [name], NP  rOPINIRole (REQUIP) 1 MG tablet TAKE 2 TABLETS(2 MG) BY MOUTH AT BEDTIME 11/13/19   Hurst, Teresa T, PA-C  silver sulfADIAZINE (SILVADENE) 1 % cream Apply 1 application topically daily. Apply to affected area daily Patient not taking: Reported on 06/07/2019 07/19/18   Newt Minion, MD  tamsulosin (FLOMAX) 0.4 MG CAPS capsule Take 1 capsule (0.4 mg total) by mouth 2 (two) times daily. 11/16/18   [name], NP  zonisamide (ZONEGRAN) 50 MG capsule TAKE 3 CAPSULES(150 MG) BY MOUTH AT BEDTIME Patient not taking: Reported on 06/07/2019 04/14/18    [name], DO    Allergies    Ivp dye [iodinated diagnostic agents], Penicillins, Sulfa antibiotics, Sulfonamide derivatives, Lisinopril, Statins, and Propoxyphene n-acetaminophen  Review of Systems   Review of Systems  Constitutional: Negative for fever.  HENT: Negative for trouble swallowing.   Respiratory: Positive for shortness of breath. Negative for cough.   Cardiovascular: Positive for chest pain.  Gastrointestinal: Negative for abdominal pain, nausea and vomiting.  Neurological: Negative for weakness.  All other systems reviewed and are negative.   Physical Exam Updated Vital Signs BP (!) 104/59    Pulse 80    Temp 98.2 F (36.8 C) (Oral)    Resp 15    Ht 6' (1.829 m)    Wt 95.3 kg    SpO2 96%    BMI 28.48 kg/m   Physical Exam Vitals and nursing note reviewed.  Constitutional:      General: He is not in acute distress.    Appearance: He is well-developed and normal weight.  HENT:     Head: Normocephalic and atraumatic.  Eyes:     Conjunctiva/sclera: Conjunctivae normal.     Pupils: Pupils are equal, round, and reactive to light.  Cardiovascular:     Rate and Rhythm: Normal rate and regular rhythm.     Heart sounds: No murmur.  Pulmonary:  Effort: Pulmonary effort is normal. No respiratory distress.     Breath sounds: Normal breath sounds. No wheezing or rales.     Comments: Well-healed midline sternotomy scar with ecchymosis present.  Tenderness with palpation to the right anterior chest near the axilla Chest:     Chest wall: Tenderness present.  Abdominal:     General: There is no distension.     Palpations: Abdomen is soft.     Tenderness: There is no abdominal tenderness. There is no guarding or rebound.  Musculoskeletal:        General: Tenderness present. Normal range of motion.     Cervical back: Normal range of motion and neck supple.     Comments: Pain over the [name] joint of the right shoulder, restrictive movement to approximately 60 degrees and  patient stops the exam with even passive movement due to pain.  1+ radial pulse in bilateral upper extremities.  Diffuse ecchymosis over bilateral upper extremities in various stages of healing.  2+ pitting edema in the right lower extremity and trace edema in the left lower extremity  Skin:    General: Skin is warm and dry.     Findings: No erythema or rash.  Neurological:     General: No focal deficit present.     Mental Status: He is alert and oriented to person, place, and time. Mental status is at baseline.     Comments: 5/5 strength in LUE, and bilateral lower ext.  Patient refuses to lift the right arm due to pain but handgrip is 5 out of 5  Psychiatric:        Mood and Affect: Mood normal.        Behavior: Behavior normal.        Thought Content: Thought content normal.     ED Results / Procedures / Treatments   Labs (all labs ordered are listed, but only abnormal results are displayed) Labs Reviewed  BASIC METABOLIC PANEL - Abnormal; Notable for the following components:      Result Value   Sodium 129 (*)    Chloride 93 (*)    Glucose, Bld 179 (*)    Calcium 8.3 (*)    All other components within normal limits  CBC - Abnormal; Notable for the following components:   WBC 10.9 (*)    RBC 4.04 (*)    Hemoglobin 12.4 (*)    HCT 37.4 (*)    All other components within normal limits  D-DIMER, QUANTITATIVE (NOT AT [facility]) - Abnormal; Notable for the following components:   D-Dimer, Quant 3.17 (*)    All other components within normal limits  BRAIN NATRIURETIC PEPTIDE - Abnormal; Notable for the following components:   B Natriuretic Peptide 362.7 (*)    All other components within normal limits  TROPONIN I (HIGH SENSITIVITY) - Abnormal; Notable for the following components:   Troponin I (High Sensitivity) 27 (*)    All other components within normal limits  TROPONIN I (HIGH SENSITIVITY) - Abnormal; Notable for the following components:   Troponin I (High Sensitivity) 27 (*)      All other components within normal limits    EKG None  ED ECG REPORT   Date: 11/23/2019  Rate: 81  Rhythm: normal sinus rhythm and premature ventricular contractions (PVC)  QRS Axis: normal  Intervals: normal  ST/T Wave abnormalities: nonspecific ST/T changes  Conduction Disutrbances:nonspecific intraventricular conduction delay  Narrative Interpretation:   Old EKG Reviewed: changes noted with imrpovement of ST  depression in lateral leads  I have personally reviewed the EKG tracing and agree with the computerized printout as noted.   Radiology DG Chest 2 View  Result Date: 11/23/2019 CLINICAL DATA:  Chest pain. Coronary artery disease. EXAM: CHEST - 2 VIEW COMPARISON:  11/13/2019 FINDINGS: Lateral view degraded by patient arm position. Midline trachea. Mild cardiomegaly. Atherosclerosis in the transverse aorta. Prior median sternotomy. No pleural effusion or pneumothorax. No congestive failure. No lobar consolidation. Numerous leads and wires project over the chest. Lucency under the right hemidiaphragm is likely within the colon. Marked degenerative changes of both shoulders with high riding humeral heads consistent with rotator cuff insufficiency. IMPRESSION: 1. No acute cardiopulmonary disease. 2. Cardiomegaly without congestive failure. 3. Lucency under the right hemidiaphragm is most likely within the colon. If there is a concern of free intraperitoneal air, recommend dedicated abdominal radiographs. 4.  Aortic Atherosclerosis (ICD10-I70.0). Electronically Signed   By: Abigail Miyamoto M.D.   On: 11/23/2019 08:12    Procedures Procedures (including critical care time)  Medications Ordered in ED Medications  sodium chloride flush (NS) 0.9 % injection 3 mL (has no administration in time range)  fentaNYL (SUBLIMAZE) injection 50 mcg (has no administration in time range)    ED Course  I have reviewed the triage vital signs and the nursing notes.  Pertinent labs & imaging results  that were available during my care of the patient were reviewed by me and considered in my medical decision making (see chart for details).    MDM Rules/Calculators/A&P                      74 year old male with severe coronary artery disease status post multiple PCI's that have been unsuccessful, CABG who was recently hospitalized for NSTEMI, CHF and unsuccessful PCI who has now been home for 2 days returns to the emergency room today with complaint of severe pain in his right shoulder and right-sided chest.  He reports anytime he attempts to move his shoulder it is extremely painful and he has to use his left arm to pick up his right hand due to the pain.  Patient reports that he was experiencing this pain while in the [name] its only worsened since he has been home.  Patient has intact pulses and sensation.  Strength seems normal in handgrip bilaterally.  Low suspicion for stroke, venous thrombosis.  Given patient's multiple PCI's that were unsuccessful possibility for dissection however seems atypical.  Also patient has a prior history of rotator cuff surgery on the right and pain is reproduced when trying to lift his arm.  Low suspicion for PE at this time as patient has been anticoagulated throughout his [name] stay he had just received anticoagulation and has been on antiplatelet agents making that possibility less likely.  Patient does have edema in the right lower extremity where the catheterization was done reports he has been taking diuretics but does not look otherwise overtly fluid overloaded.  Will repeat troponin and BNP to trend which way they are going.  Chest x-ray and right arm imaging pending we will do a D-dimer to evaluate risk for dissection.  Will ensure no new renal compromise.  Patient does have chronic pain syndrome and has multiple fentanyl patches which she reports he has been on for years we will treat with some IV pain medication.  EKG today is improved from prior EKG last  week.  Given patient's location of pain lower suspicion for Dressler syndrome.  9:13 AM Patient labs show a BMP with chronic hyponatremia of 129 with stable creatinine of 1.05, minimal leukocytosis of 10,000 and stable hemoglobin of 12, improved troponin now at 27 from the thousands.  D-dimer is elevated at 3.17 however most likely related to the recent procedures and catheterizations.  On reevaluation has normal blood pressures in both arms, pulses feel equal and with more evaluation seems very localized in the shoulder with a lower suspicion for dissection.  Low suspicion for PE at this time.  BNPreassuring at 362.  Chest x-ray shows no acute cardiopulmonary disease or evidence of CHF.  There is a lucency under the right hemidiaphragm which is most likely within the colon.  Patient has no abdominal pain or abdominal complaints concerning for free air.    9:58 AM Right shoulder shows postsurgical changes with severe acromioclavicular glenohumeral degenerative changes corticated bone fragments also noted most likely related to a prior injury and chronic rotator cuff tear.  After lidocaine patch and Tylenol patient reports his shoulder and pain is feeling much better.  Suspect this is the cause of his pain and it is not cardiac today.  Will repeat second troponin but if it remains flat and in the 20s will consider discharging patient home.  11:33 AM Recurrent trop is flat at 27.  On reevaluation patient states the medication has worn off and he is having ongoing pain in his right shoulder.  This seems much more musculoskeletal.  We will plan on discharge.  Patient given 1 more dose of pain medication here but encouraged to follow-up with orthopedics as he may need an injection.  He does have someone from home health who is planning on coming out to his home.  Because he is already on fentanyl patches he is not a candidate for further opiate therapy but will give prescription for Voltaren gel.  MDM Number  of Diagnoses or Management Options   Amount and/or Complexity of Data Reviewed Clinical lab tests: ordered and reviewed Tests in the radiology section of CPT: ordered and reviewed Tests in the medicine section of CPT: ordered and reviewed Decide to obtain previous medical records or to obtain history from someone other than the patient: yes Obtain history from someone other than the patient: no Review and summarize past medical records: yes Discuss the patient with other providers: no Independent visualization of images, tracings, or specimens: yes  Risk of Complications, Morbidity, and/or Mortality Presenting problems: moderate Diagnostic procedures: low Management options: low  Patient Progress Patient progress: improved    Final Clinical Impression(s) / ED Diagnoses Final diagnoses:  Chronic right shoulder pain    Rx / DC Orders ED Discharge Orders         Ordered    lidocaine (LIDODERM) 5 %  Every 24 hours     11/23/19 1216    diclofenac Sodium (VOLTAREN) 1 % GEL  4 times daily     11/23/19 1216           Blanchie Dessert, MD 11/23/19 1216

## 2019-11-23 NOTE — ED Notes (Signed)
Pt states almost immediate relief with lidocaine patch.

## 2019-11-23 NOTE — Discharge Instructions (Signed)
Everything with your heart today looks okay.  Think this pain is most related to your shoulder.  Will be important for you to follow-up with Dr. Theda Sers and potentially get an injection in that shoulder.  In the meantime you can continue to use her fentanyl patches and talk with your pain management doctor.  Also we will give you a gel that you can rub in the area up to 4 times a day to help with the pain.  You can also use Lidoderm patches.

## 2019-11-23 NOTE — ED Notes (Signed)
This RN witnessed a waste of 50 mcg Fentanyl with Darliss Ridgel

## 2019-11-23 NOTE — ED Notes (Signed)
Wasted 50 mcg of fentanyl with RN Babs Bertin at [phone].

## 2019-11-23 NOTE — ED Triage Notes (Signed)
Pt here via [facility], from home, for R sided chest pain that radiates to R shoulder, x 2 days.  Pt states hx of MI on 3/30 that sent him to [facility].  Given 324 asa and 1 sl nitro that did not reduce his pain.    rr 20 bp 111/51 Hr 82 spo2 99 RA cbg 194

## 2019-12-19 ENCOUNTER — Ambulatory Visit: Payer: Medicare Other | Admitting: Physician Assistant

## 2019-12-24 ENCOUNTER — Other Ambulatory Visit: Payer: Self-pay | Admitting: Physician Assistant

## 2019-12-24 NOTE — Telephone Encounter (Signed)
Has apt 05/20

## 2019-12-24 NOTE — Telephone Encounter (Signed)
Apt 01/03/2020

## 2019-12-29 ENCOUNTER — Other Ambulatory Visit: Payer: Self-pay

## 2019-12-29 ENCOUNTER — Emergency Department (HOSPITAL_COMMUNITY)
Admission: EM | Admit: 2019-12-29 | Discharge: 2020-01-01 | Disposition: A | Discharge status: Other Acute Inpatient Hospital | Payer: Medicare Other | Attending: Emergency Medicine | Admitting: Emergency Medicine

## 2019-12-29 DIAGNOSIS — F39 Unspecified mood [affective] disorder: Secondary | ICD-10-CM | POA: Diagnosis not present

## 2019-12-29 DIAGNOSIS — I10 Essential (primary) hypertension: Secondary | ICD-10-CM | POA: Diagnosis not present

## 2019-12-29 DIAGNOSIS — F22 Delusional disorders: Secondary | ICD-10-CM | POA: Diagnosis present

## 2019-12-29 DIAGNOSIS — E1151 Type 2 diabetes mellitus with diabetic peripheral angiopathy without gangrene: Secondary | ICD-10-CM | POA: Diagnosis not present

## 2019-12-29 DIAGNOSIS — F333 Major depressive disorder, recurrent, severe with psychotic symptoms: Secondary | ICD-10-CM | POA: Diagnosis not present

## 2019-12-29 DIAGNOSIS — J449 Chronic obstructive pulmonary disease, unspecified: Secondary | ICD-10-CM | POA: Diagnosis not present

## 2019-12-29 DIAGNOSIS — R4182 Altered mental status, unspecified: Secondary | ICD-10-CM | POA: Diagnosis not present

## 2019-12-29 DIAGNOSIS — Z7984 Long term (current) use of oral hypoglycemic drugs: Secondary | ICD-10-CM | POA: Diagnosis not present

## 2019-12-29 DIAGNOSIS — E1140 Type 2 diabetes mellitus with diabetic neuropathy, unspecified: Secondary | ICD-10-CM | POA: Insufficient documentation

## 2019-12-29 DIAGNOSIS — Z20822 Contact with and (suspected) exposure to covid-19: Secondary | ICD-10-CM | POA: Insufficient documentation

## 2019-12-29 DIAGNOSIS — Z7902 Long term (current) use of antithrombotics/antiplatelets: Secondary | ICD-10-CM | POA: Insufficient documentation

## 2019-12-29 DIAGNOSIS — Z0489 Encounter for examination and observation for other specified reasons: Secondary | ICD-10-CM | POA: Diagnosis present

## 2019-12-29 DIAGNOSIS — Z87891 Personal history of nicotine dependence: Secondary | ICD-10-CM | POA: Insufficient documentation

## 2019-12-29 LAB — COMPREHENSIVE METABOLIC PANEL
ALT: 15 U/L (ref 0–44)
AST: 20 U/L (ref 15–41)
Albumin: 4.2 g/dL (ref 3.5–5.0)
Alkaline Phosphatase: 77 U/L (ref 38–126)
Anion gap: 9 (ref 5–15)
BUN: 20 mg/dL (ref 8–23)
CO2: 25 mmol/L (ref 22–32)
Calcium: 8.9 mg/dL (ref 8.9–10.3)
Chloride: 94 mmol/L — ABNORMAL LOW (ref 98–111)
Creatinine, Ser: 0.92 mg/dL (ref 0.61–1.24)
GFR calc Af Amer: >60 mL/min (ref >60)
GFR calc non Af Amer: >60 mL/min (ref >60)
Glucose, Bld: 122 mg/dL — ABNORMAL HIGH (ref 70–99)
Potassium: 4.5 mmol/L (ref 3.5–5.1)
Sodium: 128 mmol/L — ABNORMAL LOW (ref 135–145)
Total Bilirubin: 0.7 mg/dL (ref 0.3–1.2)
Total Protein: 7.6 g/dL (ref 6.5–8.1)

## 2019-12-29 LAB — CBC
HCT: 41 % (ref 39.0–52.0)
Hemoglobin: 13.5 g/dL (ref 13.0–17.0)
MCH: 31.1 pg (ref 26.0–34.0)
MCHC: 32.9 g/dL (ref 30.0–36.0)
MCV: 94.5 fL (ref 80.0–100.0)
Platelets: 208 10*3/uL (ref 150–400)
RBC: 4.34 MIL/uL (ref 4.22–5.81)
RDW: 14.9 % (ref 11.5–15.5)
WBC: 5 10*3/uL (ref 4.0–10.5)
nRBC: 0 % (ref 0.0–0.2)

## 2019-12-29 LAB — RAPID URINE DRUG SCREEN, HOSP PERFORMED
Amphetamines: NOT DETECTED
Barbiturates: NOT DETECTED
Benzodiazepines: NOT DETECTED
Cocaine: NOT DETECTED
Opiates: POSITIVE — AB
Tetrahydrocannabinol: NOT DETECTED

## 2019-12-29 LAB — ETHANOL: Alcohol, Ethyl (B): <10 mg/dL (ref <10)

## 2019-12-29 MED ORDER — FENTANYL 25 MCG/HR TD PT72
1.0000 | MEDICATED_PATCH | TRANSDERMAL | Status: DC
  Administered 2019-12-29: 1 via TRANSDERMAL
  Filled 2019-12-29: qty 1

## 2019-12-29 MED ORDER — CELECOXIB 200 MG PO CAPS
200.0000 mg | ORAL_CAPSULE | Freq: Two times a day (BID) | ORAL | Status: DC
  Administered 2019-12-29 – 2020-01-01 (×6): 200 mg via ORAL
  Filled 2019-12-29 (×6): qty 1

## 2019-12-29 MED ORDER — ESCITALOPRAM OXALATE 10 MG PO TABS
10.0000 mg | ORAL_TABLET | Freq: Every day | ORAL | Status: DC
  Administered 2019-12-29 – 2020-01-01 (×4): 10 mg via ORAL
  Filled 2019-12-29 (×4): qty 1

## 2019-12-29 MED ORDER — DESONIDE 0.05 % EX CREA: 1.0000 "application " | TOPICAL_CREAM | Freq: Every day | CUTANEOUS | Status: DC | PRN

## 2019-12-29 MED ORDER — RISPERIDONE 0.5 MG PO TABS
0.5000 mg | ORAL_TABLET | Freq: Two times a day (BID) | ORAL | Status: DC
  Administered 2019-12-30 – 2020-01-01 (×5): 0.5 mg via ORAL
  Filled 2019-12-29 (×5): qty 1

## 2019-12-29 MED ORDER — VITAMIN D3 125 MCG (5000 UT) PO TABS: 5000.0000 [IU] | ORAL_TABLET | Freq: Every day | ORAL | Status: DC

## 2019-12-29 MED ORDER — CARVEDILOL 3.125 MG PO TABS
6.2500 mg | ORAL_TABLET | Freq: Two times a day (BID) | ORAL | Status: DC
  Administered 2019-12-29 – 2019-12-31 (×4): 6.25 mg via ORAL
  Filled 2019-12-29 (×5): qty 2

## 2019-12-29 MED ORDER — GABAPENTIN 400 MG PO CAPS
400.0000 mg | ORAL_CAPSULE | ORAL | Status: DC
  Administered 2019-12-29: 400 mg via ORAL
  Filled 2019-12-29: qty 1

## 2019-12-29 MED ORDER — ADULT MULTIVITAMIN W/MINERALS CH
1.0000 | ORAL_TABLET | Freq: Every day | ORAL | Status: DC
  Administered 2019-12-29 – 2020-01-01 (×4): 1 via ORAL
  Filled 2019-12-29 (×4): qty 1

## 2019-12-29 MED ORDER — VITAMIN D 25 MCG (1000 UNIT) PO TABS
5000.0000 [IU] | ORAL_TABLET | Freq: Every day | ORAL | Status: DC
  Administered 2019-12-30 – 2019-12-31 (×2): 5000 [IU] via ORAL
  Filled 2019-12-29 (×3): qty 5

## 2019-12-29 MED ORDER — TAMSULOSIN HCL 0.4 MG PO CAPS
0.4000 mg | ORAL_CAPSULE | Freq: Every day | ORAL | Status: DC
  Administered 2019-12-29 – 2020-01-01 (×4): 0.4 mg via ORAL
  Filled 2019-12-29 (×4): qty 1

## 2019-12-29 MED ORDER — BUPROPION HCL ER (XL) 150 MG PO TB24: 300.0000 mg | ORAL_TABLET | Freq: Every day | ORAL | Status: DC

## 2019-12-29 MED ORDER — PRAVASTATIN SODIUM 20 MG PO TABS
20.0000 mg | ORAL_TABLET | Freq: Every day | ORAL | Status: DC
  Administered 2019-12-29 – 2019-12-31 (×2): 20 mg via ORAL
  Filled 2019-12-29 (×4): qty 1

## 2019-12-29 MED ORDER — ROPINIROLE HCL 1 MG PO TABS
1.0000 mg | ORAL_TABLET | Freq: Every day | ORAL | Status: DC
  Administered 2019-12-29 – 2019-12-31 (×3): 1 mg via ORAL
  Filled 2019-12-29 (×3): qty 1

## 2019-12-29 MED ORDER — NITROGLYCERIN 0.4 MG SL SUBL: 0.4000 mg | SUBLINGUAL_TABLET | SUBLINGUAL | Status: DC | PRN

## 2019-12-29 MED ORDER — ASPIRIN EC 81 MG PO TBEC
81.0000 mg | DELAYED_RELEASE_TABLET | Freq: Every day | ORAL | Status: DC
  Administered 2019-12-29 – 2020-01-01 (×4): 81 mg via ORAL
  Filled 2019-12-29 (×4): qty 1

## 2019-12-29 MED ORDER — SACUBITRIL-VALSARTAN 49-51 MG PO TABS
1.0000 | ORAL_TABLET | Freq: Two times a day (BID) | ORAL | Status: DC
  Administered 2019-12-29 – 2020-01-01 (×6): 1 via ORAL
  Filled 2019-12-29 (×6): qty 1

## 2019-12-29 MED ORDER — ALIROCUMAB 75 MG/ML ~~LOC~~ SOAJ: 75.0000 mg | SUBCUTANEOUS | Status: DC

## 2019-12-29 MED ORDER — ISOSORBIDE MONONITRATE ER 60 MG PO TB24
60.0000 mg | ORAL_TABLET | Freq: Every day | ORAL | Status: DC
  Administered 2019-12-29 – 2019-12-31 (×3): 60 mg via ORAL
  Filled 2019-12-29 (×4): qty 1

## 2019-12-29 MED ORDER — FUROSEMIDE 20 MG PO TABS
20.0000 mg | ORAL_TABLET | Freq: Two times a day (BID) | ORAL | Status: DC
  Administered 2019-12-29 – 2019-12-31 (×4): 20 mg via ORAL
  Filled 2019-12-29 (×7): qty 1

## 2019-12-29 MED ORDER — OXYCODONE-ACETAMINOPHEN 5-325 MG PO TABS
1.0000 | ORAL_TABLET | Freq: Three times a day (TID) | ORAL | Status: DC | PRN
  Administered 2020-01-01: 1 via ORAL
  Filled 2019-12-29: qty 1

## 2019-12-29 MED ORDER — OXYCODONE-ACETAMINOPHEN 10-325 MG PO TABS: 1.0000 | ORAL_TABLET | Freq: Three times a day (TID) | ORAL | Status: DC | PRN

## 2019-12-29 MED ORDER — HYDROCORTISONE 1 % EX OINT
TOPICAL_OINTMENT | Freq: Every day | CUTANEOUS | Status: DC | PRN
  Filled 2019-12-29: qty 28.35

## 2019-12-29 MED ORDER — PANTOPRAZOLE SODIUM 40 MG PO TBEC
40.0000 mg | DELAYED_RELEASE_TABLET | Freq: Every day | ORAL | Status: DC
  Administered 2019-12-29 – 2020-01-01 (×4): 40 mg via ORAL
  Filled 2019-12-29 (×4): qty 1

## 2019-12-29 MED ORDER — SENNOSIDES-DOCUSATE SODIUM 8.6-50 MG PO TABS
2.0000 | ORAL_TABLET | Freq: Two times a day (BID) | ORAL | Status: DC
  Administered 2019-12-29 – 2020-01-01 (×5): 2 via ORAL
  Filled 2019-12-29 (×5): qty 2

## 2019-12-29 MED ORDER — LINAGLIPTIN 5 MG PO TABS
5.0000 mg | ORAL_TABLET | Freq: Every day | ORAL | Status: DC
  Administered 2019-12-29 – 2020-01-01 (×4): 5 mg via ORAL
  Filled 2019-12-29 (×4): qty 1

## 2019-12-29 MED ORDER — ALBUTEROL SULFATE HFA 108 (90 BASE) MCG/ACT IN AERS: 2.0000 | INHALATION_SPRAY | Freq: Four times a day (QID) | RESPIRATORY_TRACT | Status: DC | PRN

## 2019-12-29 MED ORDER — TICAGRELOR 90 MG PO TABS
90.0000 mg | ORAL_TABLET | Freq: Two times a day (BID) | ORAL | Status: DC
  Administered 2019-12-29 – 2020-01-01 (×6): 90 mg via ORAL
  Filled 2019-12-29 (×6): qty 1

## 2019-12-29 MED ORDER — CLONAZEPAM 0.5 MG PO TABS: 0.5000 mg | ORAL_TABLET | Freq: Two times a day (BID) | ORAL | Status: DC | PRN

## 2019-12-29 MED ORDER — OXYCODONE HCL 5 MG PO TABS
5.0000 mg | ORAL_TABLET | Freq: Three times a day (TID) | ORAL | Status: DC | PRN
  Administered 2020-01-01: 5 mg via ORAL
  Filled 2019-12-29: qty 1

## 2019-12-29 NOTE — ED Triage Notes (Addendum)
Pt BIB Police. He asked APS to get off of his property while they were there for a welfare check. Police state that they need patient seen so that the IVC can be rescinded. They see no reason for IVC. No medical complaint.

## 2019-12-29 NOTE — ED Notes (Addendum)
Medications unverified by pharmacy. Message sent.  Pt sleeping at this time.

## 2019-12-29 NOTE — BH Assessment (Signed)
Assessment Note  Commodore Bellew is an 74 y.o. male that presents this date with IVC. Per IVC patient has been previously diagnosed with MDD, GAD, memory issues and a delusional disorder. A welfare check was initiated due to patient sleeping in his car at night and having hallucinations associated with ongoing paranoia and claims individuals broke into his home and took his medications. When the APS worker came to check on individual he pulled a gun on that individual. He is a danger to himself and others. Patient denies content of IVC during assessment. Patient denies any S/I, H/I or AVH. Patient appears to be disorganized and gives random answers not associated with assessment questions. Patient denies any current mental health symptoms or diagnoses. Patient per chart review has a history of delusions and paranoia. Patient denies any SA history.     Per chart review patient has been prescribed mental health medications that are managed by [facility]. Per chart review that note of 10/23/19 [facility] notes. "Patient is having some acute health problems. See ROS. He's overwhelmed b/c of them. States he could cry anytime. He is able to enjoy things when he can. He used to go to [place] almost daily but now because of COVID he's not able to sit in there and talk to other people. So he does not go very much. His energy and motivation have decreased, but mostly due to his physical health problems. He deals with chronic pain which is somewhat relieved with his current medications. He sees pain management. He denies suicidal or homicidal thoughts.  Sleeps ok "when nobody is bothering me." Says 'they're still bothering me. I'm going to go to our new Representative and file a human rights complaint about them. (meaning the FBI) They've used microwave on me, I think.  It all goes back to when I was working in the [employer]." Has moved and is renting a place now and plans to sell his house "after they ruined it."  He  thinks that the [place] will not be able to find him at his new place that he is renting.  I did not question him further about the FBI. 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  In the past, he has told me the company will do everything in their power to make his life miserable until he dies. He states that nobody believes him. He has complained to Durenda Hurt in the past and he would not do anything about it. A year or so ago, I prescribed Risperdal for him, hoping it would help him sleep but also get rid of the paranoia. He stopped it because he looked it up online and realized it is an antipsychotic. He came back in and told me that "I am not crazy" and stated he did not need any medication like that. He has refused any other antipsychotic.    Still has anxiety but the Klonopin does help.  It is worse "when the FBI has been in my home and the move all of my stuff around."   Per admission note this date. Pt was sent here by a welfare check that determined patient to be IVC'd. Pt was at home when protective services stopped by with a gun on his waist. Pt told the services to leave so they decided to IVC patient. IVC paperwork states patient diagnosed  with PTSD, memory issues, MDD, GAD and delusional disorders and that patient is not taking his medications. Today pulled out a gun on APS workers doing welfare check. Patient sleeping in his car at night on occasions having hallucinations claiming the FBI came to his house and took his medications. Pt has been committed before and is a danger to himself and others.  Patient is oriented x 4 and presents with a pleasant affect. Patient is observed to be disorganized and difficult to redirect at times. Patient does not appear to be responding to internal stimuli. Patient continues to be fixed on the FBI and others who broke  into his home. Case was staffed with Reita Cliche DNP who also evaluated patient and recommended a inpatient admission (Geropsychiatry) for stabilization.    Diagnosis: Altered mental state   Past Medical History:  Past Medical History:  Diagnosis Date  . Anemia    NOS  . Anxiety   . 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 BPH (benign prostatic hypertrophy)   . Broken foot Oct. 12, 2013   Right foot Fx  . CAD (coronary artery disease)   . Carotid stenosis, left   . Cellulitis and abscess of leg 03/2018   right leg  . Chronic hyponatremia   . Chronic pain syndrome   . COPD (chronic obstructive pulmonary disease) ([place])   . Depression   . Diabetes mellitus   . Diabetic foot ulcer ([place])   . DJD (degenerative joint disease)    L wrist  . DM2 (diabetes mellitus, type 2) ([place])   . GERD (gastroesophageal reflux disease)   . HLD (hyperlipidemia)   . HTN (hypertension)   . Hyponatremia   . Lumbar disc disease   . MI (mitral incompetence)   . Osteoporosis    tx per [facility], Dr Prudencio Burly, thought due to heavy steriod use after 1978  . Peripheral neuropathy   . Peripheral vascular disease ([place])   . Spine fracture    hx, multiple  . Varicose veins   . 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 Vitamin D deficiency     Past Surgical History:  Procedure Laterality Date  . ANGIOPLASTY    . aorta bifemoral bypass grafting  09/2010  . CARDIAC CATHETERIZATION N/A 09/19/2015   Procedure: Left Heart Cath and Coronary Angiography;  Surgeon: Adrian Prows, MD;  Location: [place] [facility];  Service: Cardiovascular;  Laterality: N/A;  . CARPAL TUNNEL RELEASE     right 2006/ left 2007  . CATARACT EXTRACTION     left 1996/ right 1997  . CORONARY ARTERY BYPASS GRAFT N/A 09/22/2015   Procedure: CORONARY ARTERY BYPASS GRAFTING (CABG) times four using the right greater saphenous vein harvested endoscopically and the left internal mammary artery.  LIMA-LAD, SEQ SVG-DIAG & OM, SVG-PD.;   Surgeon: [name], MD;  Location: [place];  Service: Open Heart Surgery;  Laterality: N/A;  . I & D EXTREMITY Right 04/07/2018   Procedure: IRRIGATION AND DEBRIDEMENT ABSCESS RIGHT LEG;  Surgeon: Newt Minion, MD;  Location: [place];  Service: Orthopedics;  Laterality: Right;  . L foot open repair jones fracture  2010    5th metetarsal   . left ankle ganglion cyst  1976  . left carotid endarterectomy  03/2011  . left cataract  1996   right - 1997  . left CTS  2006   R CTS - 2007  . left foot  surgery  1998   R surgery/fracture - 1999  . left plantar ganglion cystectomy  1979  . left wrist/hand fusion  2008  . [place], 2004  . NASAL SINUS SURGERY     multiple- x8. last 1997 with obliteration  . REPAIR THORACIC AORTA  2017  . right hand fracture  1969  . ROTATOR CUFF REPAIR  2006   R, than repeat 2011, Dr Theda Sers  . TRIGGER FINGER RELEASE Left 11/11/2014   Procedure: LEFT LONG FINGER RELEASE TRIGGER FINGER/A-1 PULLEY;  Surgeon: Milly Jakob, MD;  Location: [place];  Service: Orthopedics;  Laterality: Left;  . vocal surgery  1996    Family History:  Family History  Problem Relation Age of Onset  . Lung cancer Father 93  . Diabetes Mother 83  . Osteoporosis Mother   . Depression Mother   . Bleeding Disorder Mother   . Throat cancer Other        family hx - "bad living" - also lung CA, heart disease and renal failure    . Diabetes Sister   . Suicidality Son 56  . Lung cancer Maternal Grandmother 44  . Diabetes Maternal Grandfather 38  . Heart disease Maternal Grandfather   . Heart attack Paternal Grandmother   . Brain cancer Paternal Grandfather 26    Social History:  reports that he quit smoking about 31 years ago. His smoking use included cigarettes. He has a 45.00 pack-year smoking history. He has never used smokeless tobacco. He reports previous alcohol use. He reports that he does not use drugs.  Additional Social History:   Alcohol / Drug Use Pain Medications: See MAR Prescriptions: See MAR Over the Counter: See MAR History of alcohol / drug use?: No history of alcohol / drug abuse  CIWA: CIWA-Ar BP: (!) 90/58 Pulse Rate: 68 COWS:    Allergies:  Allergies  Allergen Reactions  . Ivp Dye [Iodinated Diagnostic Agents]     Blood Pressure dropped----pt was pre-medicated with 13 hour prep and did fine with pre-meds--amy 03/09/11   . Penicillins Nausea Only    REACTION: stomach pain(IV ok) Has patient had a PCN reaction causing immediate rash, facial/tongue/throat swelling, SOB or lightheadedness with hypotension: No Has patient had a PCN reaction causing severe rash involving mucus membranes or skin necrosis: No Has patient had a PCN reaction that required hospitalization: No Has patient had a PCN reaction occurring within the last 10 years: No If all of the above answers are "NO", then may proceed with Cephalosporin use.   . Sulfa Antibiotics Hives  . Sulfonamide Derivatives Hives and Itching  . Lisinopril Cough  . Metoprolol Nausea And Vomiting  . Statins Other (See Comments)    myalgias  . Propoxyphene N-Acetaminophen Other (See Comments)    Sharp pains- headache    Home Medications: (Not in a hospital admission)   OB/GYN Status:  No LMP for male patient.  General Assessment Data Assessment unable to be completed: Yes Reason for not completing assessment: patient in the hallway Location of Assessment: [facility] TTS Assessment: In system Is this a Tele or Face-to-Face Assessment?: Face-to-Face Is this an Initial Assessment or a Re-assessment for this encounter?: Initial Assessment Patient Accompanied by:: N/A Language Other than English: No Living Arrangements: Other (Comment)(Alone) What gender do you identify as?: Male Marital status: Single Living Arrangements: Alone Can pt return to current living arrangement?: Yes Admission Status: Involuntary Petitioner: Advice worker) Is patient  capable of signing voluntary  admission?: No Referral Source: Advice worker) Insurance type: Engineer, maintenance Exam ([place]) Medical Exam completed: Yes  Crisis Care Plan Living Arrangements: Alone Legal Guardian: (NA) Name of Psychiatrist: None Name of Therapist: None  Education Status Is patient currently in school?: No Is the patient employed, unemployed or receiving disability?: Unemployed  Risk to self with the past 6 months Suicidal Ideation: No Has patient been a risk to self within the past 6 months prior to admission? : No Suicidal Intent: No Has patient had any suicidal intent within the past 6 months prior to admission? : No Is patient at risk for suicide?: No Suicidal Plan?: No Has patient had any suicidal plan within the past 6 months prior to admission? : No Access to Means: No What has been your use of drugs/alcohol within the last 12 months?: Denies Previous Attempts/Gestures: No How many times?: 0 Other Self Harm Risks: (Declining MH issues) Triggers for Past Attempts: (NA) Intentional Self Injurious Behavior: None Family Suicide History: No Recent stressful life event(s): Other (Comment)(Declining mental health memory issues) Persecutory voices/beliefs?: No Depression: No Depression Symptoms: (Denies) Substance abuse history and/or treatment for substance abuse?: No Suicide prevention information given to non-admitted patients: Not applicable  Risk to Others within the past 6 months Homicidal Ideation: No Does patient have any lifetime risk of violence toward others beyond the six months prior to admission? : No Thoughts of Harm to Others: No Current Homicidal Intent: No Current Homicidal Plan: No Access to Homicidal Means: No Identified Victim: NA History of harm to others?: No Assessment of Violence: None Noted Violent Behavior Description: NA Does patient have access to weapons?: No Criminal Charges Pending?: No Does  patient have a court date: No Is patient on probation?: No  Psychosis Hallucinations: None noted Delusions: None noted  Mental Status Report Appearance/Hygiene: Unremarkable Eye Contact: Fair Motor Activity: Freedom of movement Speech: Soft, Slow Level of Consciousness: Quiet/awake Mood: Pleasant Affect: Appropriate to circumstance Anxiety Level: Minimal Thought Processes: Thought Blocking Judgement: Partial Orientation: Person, Place, Time Obsessive Compulsive Thoughts/Behaviors: None  Cognitive Functioning Concentration: Decreased Memory: Recent Intact, Remote Intact Is patient IDD: No Insight: Poor Impulse Control: Fair Appetite: Good Have you had any weight changes? : No Change Sleep: No Change Total Hours of Sleep: 7 Vegetative Symptoms: None  ADLScreening [facility] Assessment Services) Patient's cognitive ability adequate to safely complete daily activities?: Yes Patient able to express need for assistance with ADLs?: Yes Independently performs ADLs?: Yes (appropriate for developmental age)  Prior Inpatient Therapy Prior Inpatient Therapy: No  Prior Outpatient Therapy Prior Outpatient Therapy: No Does patient have an ACCT team?: No Does patient have Intensive In-House Services?  : No Does patient have [facility] services? : No Does patient have P4CC services?: No  ADL Screening (condition at time of admission) Patient's cognitive ability adequate to safely complete daily activities?: Yes Is the patient deaf or have difficulty hearing?: No Does the patient have difficulty seeing, even when wearing glasses/contacts?: No Does the patient have difficulty concentrating, remembering, or making decisions?: No Patient able to express need for assistance with ADLs?: Yes Does the patient have difficulty dressing or bathing?: No Independently performs ADLs?: Yes (appropriate for developmental age) Does the patient have difficulty walking or climbing stairs?: No Weakness of  Legs: None Weakness of Arms/Hands: None  Home Assistive Devices/Equipment Home Assistive Devices/Equipment: None  Therapy Consults (therapy consults require a physician order) PT Evaluation Needed: No OT Evalulation Needed: No SLP Evaluation Needed: No Abuse/Neglect Assessment (  Assessment to be complete while patient is alone) Abuse/Neglect Assessment Can Be Completed: Yes Physical Abuse: Denies Verbal Abuse: Denies Sexual Abuse: Denies Exploitation of patient/patient's resources: Denies Self-Neglect: Denies Values / Beliefs Cultural Requests During Hospitalization: None Spiritual Requests During Hospitalization: None Consults Spiritual Care Consult Needed: No Transition of Care Team Consult Needed: No Advance Directives (For Healthcare) Does Patient Have a Medical Advance Directive?: No Would patient like information on creating a medical advance directive?: No - Patient declined          Disposition: Case was staffed with Reita Cliche DNP who also evaluated patient and recommended a inpatient admission (Geropsychiatry) for stabilization.     Disposition Initial Assessment Completed for this Encounter: Yes Disposition of Patient: Admit Type of inpatient treatment program: Adult  On Site Evaluation by:   Reviewed with Physician:    Mamie Nick 12/29/2019 12:48 PM

## 2019-12-29 NOTE — ED Triage Notes (Addendum)
Pt was sent here by a welfare check that determined patient to be IVC'd. Pt was at home when protective services stopped by with a gun on his waist. Pt told the services to leave so they decided to IVC patient. IVC paperwork states patient diagnosed with PTSD, memory issues, MDD, GAD and delusional disorders and that patient is not taking his medications. Today pulled out a gun on APS workers doing welfare check. Patient sleeping in his car at night on occasions having hallucinations claiming the FBI came to his house and took his medications. Pt has been committed before and is a danger to himself and others.

## 2019-12-29 NOTE — Progress Notes (Signed)
CSW informed by [place] of [facility] that patient was denied due to medical acuity.   [name] Guerry Bruin, MSW, [place] Work/Disposition Phone: [phone] Fax: [phone]

## 2019-12-29 NOTE — ED Notes (Signed)
Pt personal identification and important belongings in cabinet labeled "[address]

## 2019-12-29 NOTE — Progress Notes (Signed)
Received [name] from the main ED with the technician walking with his cane.He was oriented to his new environment and his personal items were inventory and locked up with security assistance. He slept throughout the night except for two interruptions to use the bathroom.

## 2019-12-29 NOTE — ED Notes (Signed)
Pt stated nasal surgery with "brain plate" consulted MD, waiting to hear if it was okay to collect nasopharyngeal swab

## 2019-12-29 NOTE — Progress Notes (Signed)
Recommend geriatric psychiatry for stabilization  Waylan Boga, PMHNP

## 2019-12-29 NOTE — ED Provider Notes (Signed)
[facility] DEPT Provider Note   CSN: [id number] Arrival date & time: 12/29/19  1062     History Chief Complaint  Patient presents with  . Medical Clearance    [name] is a 74 y.o. male.  The history is provided by the patient.  Mental Health Problem Presenting symptoms: delusional   Patient accompanied by:  Law enforcement Degree of incapacity (severity):  Moderate Timing:  Constant Progression:  Unchanged Chronicity:  New Relieved by:  Nothing Worsened by:  Nothing Patient with extensive medical history including COPD, depression, diabetes presents law enforcement custody.  Patient was visited by adult protective services they felt patient needed to be under involuntary commitment. Patient denies any new issues at this time.  He reports it was a misunderstanding. Patient reports chronic back pain, but no other acute complaints     Past Medical History:  Diagnosis Date  . Anemia    NOS  . Anxiety   . 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 BPH (benign prostatic hypertrophy)   . Broken foot Oct. 12, 2013   Right foot Fx  . CAD (coronary artery disease)   . Carotid stenosis, left   . Cellulitis and abscess of leg 03/2018   right leg  . Chronic hyponatremia   . Chronic pain syndrome   . COPD (chronic obstructive pulmonary disease) ([place])   . Depression   . Diabetes mellitus   . Diabetic foot ulcer ([place])   . DJD (degenerative joint disease)    L wrist  . DM2 (diabetes mellitus, type 2) ([place])   . GERD (gastroesophageal reflux disease)   . HLD (hyperlipidemia)   . HTN (hypertension)   . Hyponatremia   . Lumbar disc disease   . MI (mitral incompetence)   . Osteoporosis    tx per [facility], Dr Prudencio Burly, thought due to heavy steriod use after 1978  . Peripheral neuropathy   . Peripheral vascular disease ([place])   . Spine fracture    hx, multiple  . Varicose veins   . Visual impairment of left eye    artery  occlusion  . Vitamin D deficiency     Patient Active Problem List   Diagnosis Date Noted  . Major depressive disorder, recurrent episode, moderate ([place]) 07/03/2018  . Generalized anxiety disorder 07/03/2018  . Restless leg syndrome 07/03/2018  . Paranoia ([place]) 07/03/2018  . PTSD (post-traumatic stress disorder) 05/29/2018  . Abscess of right lower leg   . Severe protein-calorie malnutrition ([place])   . 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 Diabetic polyneuropathy associated with type 2 diabetes mellitus ([place])   . Cellulitis 04/01/2018  . Cellulitis of right lower extremity 04/01/2018  . Tachycardia 04/01/2018  . 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 Unstable angina ([place]) 09/20/2015  . Chest pain, rule out acute myocardial infarction 09/18/2015  . Diabetes mellitus type 2 with peripheral artery disease ([place]) 09/18/2015  . Hyponatremia 09/18/2015  . Chest pain on exertion 09/18/2015  . Varicose veins of bilateral lower extremities with other complications 69/48/5462  . Occlusion and stenosis of carotid artery without mention of cerebral infarction 10/18/2011  . SINUSITIS, ACUTE 03/13/2010  . BACK PAIN 11/13/2009  . FATIGUE 10/17/2009  . Diabetic neuropathy associated with diabetes mellitus due to underlying condition ([place]) 08/29/2009  . Vitamin D deficiency 08/29/2009  . Hyposmolality and/or hyponatremia 08/29/2009  . VISUAL ACUITY, DECREASED, LEFT EYE 08/29/2009  .  PERIPHERAL VASCULAR DISEASE 08/29/2009  . SINUSITIS, CHRONIC 08/29/2009  . NEPHROLITHIASIS 08/29/2009  . FIBROMYALGIA 08/29/2009  . BICEPS TENDON RUPTURE, RIGHT 08/11/2009  . DIABETES MELLITUS, TYPE II 08/05/2009  . Hyperlipidemia 08/05/2009  . ANEMIA-NOS 08/05/2009  . ANXIETY 08/05/2009  . DEPRESSION 08/05/2009  . Chronic pain syndrome 08/05/2009  . Essential hypertension 08/05/2009  . GERD 08/05/2009  . BENIGN PROSTATIC HYPERTROPHY 08/05/2009  . [place] DISEASE, LUMBAR  08/05/2009  . Osteoporosis 08/05/2009    Past Surgical History:  Procedure Laterality Date  . ANGIOPLASTY    . aorta bifemoral bypass grafting  09/2010  . CARDIAC CATHETERIZATION N/A 09/19/2015   Procedure: Left Heart Cath and Coronary Angiography;  Surgeon: Adrian Prows, MD;  Location: [facility];  Service: Cardiovascular;  Laterality: N/A;  . CARPAL TUNNEL RELEASE     right 2006/ left 2007  . CATARACT EXTRACTION     left 1996/ right 1997  . CORONARY ARTERY BYPASS GRAFT N/A 09/22/2015   Procedure: CORONARY ARTERY BYPASS GRAFTING (CABG) times four using the right greater saphenous vein harvested endoscopically and the left internal mammary artery.  LIMA-LAD, SEQ SVG-DIAG & OM, SVG-PD.;  Surgeon: [name], MD;  Location: [facility];  Service: Open Heart Surgery;  Laterality: N/A;  . I & D EXTREMITY Right 04/07/2018   Procedure: IRRIGATION AND DEBRIDEMENT ABSCESS RIGHT LEG;  Surgeon: Newt Minion, MD;  Location: [facility];  Service: Orthopedics;  Laterality: Right;  . L foot open repair jones fracture  2010    5th metetarsal   . left ankle ganglion cyst  1976  . left carotid endarterectomy  03/2011  . left cataract  1996   right - 1997  . left CTS  2006   R CTS - 2007  . left foot surgery  1998   R surgery/fracture - 1999  . left plantar ganglion cystectomy  1979  . left wrist/hand fusion  2008  . [place], 2004  . NASAL SINUS SURGERY     multiple- x8. last 1997 with obliteration  . REPAIR THORACIC AORTA  2017  . right hand fracture  1969  . ROTATOR CUFF REPAIR  2006   R, than repeat 2011, Dr Theda Sers  . TRIGGER FINGER RELEASE Left 11/11/2014   Procedure: LEFT LONG FINGER RELEASE TRIGGER FINGER/A-1 PULLEY;  Surgeon: Milly Jakob, MD;  Location: [facility];  Service: Orthopedics;  Laterality: Left;  . vocal surgery  1996       Family History  Problem Relation Age of Onset  . Lung cancer Father 28  . Diabetes Mother 21  . Osteoporosis  Mother   . Depression Mother   . Bleeding Disorder Mother   . Throat cancer Other        family hx - "bad living" - also lung CA, heart disease and renal failure    . Diabetes Sister   . Suicidality Son 36  . Lung cancer Maternal Grandmother 5  . Diabetes Maternal Grandfather 72  . Heart disease Maternal Grandfather   . Heart attack Paternal Grandmother   . Brain cancer Paternal Grandfather 48    Social History   Tobacco Use  . Smoking status: Former Smoker    Packs/day: 1.50    Years: 30.00    Pack years: 45.00    Types: Cigarettes    Quit date: 11/06/1988    Years since quitting: 31.1  . Smokeless tobacco: Never Used  Substance Use Topics  . Alcohol use:  Not Currently    Comment: None in 30 years but drank beer before  . Drug use: No    Home Medications Prior to Admission medications   Medication Sig Start Date End Date Taking? Authorizing Provider  ACCU-CHEK FASTCLIX LANCETS MISC Use as directed to check blood glucose. Dx: E11.51 01/17/18   [name], NP  albuterol (PROAIR HFA) 108 (90 Base) MCG/ACT inhaler Inhale 2 puffs into the lungs every 6 (six) hours as needed for wheezing or shortness of breath.    [provider]  ARIPiprazole (ABILIFY) 2 MG tablet Take 1 tablet (2 mg total) by mouth every morning. Patient not taking: Reported on 11/23/2019 10/23/19   Donnal Moat T, PA-C  Blood Glucose Monitoring Suppl (ACCU-CHEK AVIVA PLUS) w/Device KIT Use as directed to check blood glucose daily. Dx: E11.51 01/17/18   [name], NP  BRILINTA 90 MG TABS tablet Take 90 mg by mouth 2 (two) times daily. 11/21/19   [provider]  buPROPion (WELLBUTRIN XL) 150 MG 24 hr tablet Take 2 tablets (300 mg total) by mouth daily. Patient not taking: Reported on 11/23/2019 08/23/19   Donnal Moat T, PA-C  buPROPion (WELLBUTRIN XL) 300 MG 24 hr tablet Take 300 mg by mouth daily.    [provider]  carvedilol (COREG) 6.25 MG tablet Take 6.25 mg by mouth 2  (two) times daily. 11/21/19   [provider]  celecoxib (CELEBREX) 200 MG capsule TAKE 1 CAPSULE(200 MG) BY MOUTH DAILY Patient taking differently: Take 200 mg by mouth 2 (two) times daily.  10/02/18   [name], NP  Cholecalciferol (VITAMIN D3) 5000 units TABS Take 5,000 Units by mouth daily.    [provider]  clonazePAM (KLONOPIN) 0.5 MG tablet TAKE 1 TABLET(0.5 MG) BY MOUTH TWICE DAILY AS NEEDED FOR ANXIETY 12/25/19   Hurst, Helene Kelp T, PA-C  clopidogrel (PLAVIX) 75 MG tablet TAKE 1 TABLET BY MOUTH DAILY WITH FOOD Patient not taking: Reported on 11/23/2019 07/17/19   Adrian Prows, MD  cyclobenzaprine (FLEXERIL) 10 MG tablet TAKE 1 TABLET(10 MG) BY MOUTH THREE TIMES DAILY AS NEEDED FOR MUSCLE SPASMS Patient not taking: Reported on 06/07/2019 02/09/19   Adrian Prows, MD  desonide (DESOWEN) 0.05 % cream Apply 1 application topically daily as needed (rosacea).     [provider]  diclofenac Sodium (VOLTAREN) 1 % GEL Apply 2 g topically 4 (four) times daily. 11/23/19   Blanchie Dessert, MD  ENTRESTO 24-26 MG Take 1 tablet by mouth in the morning and at bedtime. 11/21/19   [provider]  fentaNYL 37.5 MCG/HR PT72 Place 1 patch onto the skin every 3 (three) days.    [provider]  furosemide (LASIX) 20 MG tablet TAKE 1 TABLET BY MOUTH TWICE DAILY Patient taking differently: Take 20 mg by mouth 2 (two) times daily.  07/17/19   Adrian Prows, MD  gabapentin (NEURONTIN) 400 MG capsule 1 q am, 2 qhs Patient taking differently: Take 400-800 mg by mouth 2 (two) times daily. 1 capsule in the morning and 2 capsules in the evening 08/23/19   Donnal Moat T, PA-C  glucose blood (ACCU-CHEK AVIVA PLUS) test strip 1 each by Other route as needed for other. Use as instructed. Dx: E11.51 01/17/18   [name], NP  IRON PO Take 1 capsule by mouth daily.    [provider]  JANUVIA 100 MG tablet TAKE 1 TABLET BY MOUTH EVERY DAY Patient taking differently: Take  100 mg by  mouth daily.  05/19/18   [name], DO  lidocaine (LIDODERM) 5 % Place 1 patch onto the skin daily. Remove & Discard patch within 12 hours or as directed by MD 11/23/19   Blanchie Dessert, MD  losartan (COZAAR) 25 MG tablet TAKE 1 TABLET DAILY Patient not taking: Reported on 11/23/2019 12/25/18   [name], NP  Multiple Vitamin (MULTIVITAMIN WITH MINERALS) TABS tablet Take 1 tablet by mouth daily.    [provider]  NARCAN 4 MG/0.1ML LIQD nasal spray kit Place 4 mg into the nose as needed (opioid reversal).  06/18/19   [provider]  nitroGLYCERIN (NITROSTAT) 0.4 MG SL tablet PLACE 1 TABLET UNDER THE TONGUE EVERY 5 MINUTES AS NEEDED FOR CHEST PAIN Patient taking differently: Place 0.4 mg under the tongue every 5 (five) minutes as needed for chest pain.  07/17/19   Adrian Prows, MD  pantoprazole (PROTONIX) 40 MG tablet Take 1 tablet (40 mg total) by mouth 2 (two) times daily. 08/30/18   [name], NP  potassium chloride (K-DUR) 10 MEQ tablet Take 1 tablet (10 mEq total) by mouth daily. 06/28/18   [name], NP  PRALUENT 75 MG/ML SOAJ Inject 75 mg into the skin every 14 (fourteen) days. 10/11/19   [provider]  rOPINIRole (REQUIP) 1 MG tablet TAKE 2 TABLETS(2 MG) BY MOUTH AT BEDTIME Patient taking differently: Take 2 mg by mouth daily.  11/13/19   Donnal Moat T, PA-C  silver sulfADIAZINE (SILVADENE) 1 % cream Apply 1 application topically daily. Apply to affected area daily Patient not taking: Reported on 06/07/2019 07/19/18   Newt Minion, MD  tamsulosin (FLOMAX) 0.4 MG CAPS capsule Take 1 capsule (0.4 mg total) by mouth 2 (two) times daily. Patient taking differently: Take 0.4 mg by mouth daily.  11/16/18   [name], NP  zonisamide (ZONEGRAN) 50 MG capsule TAKE 3 CAPSULES(150 MG) BY MOUTH AT BEDTIME Patient not taking: Reported on 06/07/2019 04/14/18   [name], DO    Allergies    Ivp dye [iodinated diagnostic  agents], Penicillins, Sulfa antibiotics, Sulfonamide derivatives, Lisinopril, Metoprolol, Statins, and Propoxyphene n-acetaminophen  Review of Systems   Review of Systems  Constitutional: Negative for fever.  Musculoskeletal: Positive for back pain.       Chronic back pain  All other systems reviewed and are negative.   Physical Exam Updated Vital Signs BP (!) 155/79 (BP Location: Left Arm)   Pulse 73   Temp 97.8 F (36.6 C) (Oral)   Resp 17   SpO2 98%   Physical Exam CONSTITUTIONAL: Elderly, disheveled HEAD: Normocephalic/atraumatic EYES: EOMI ENMT: Mucous membranes moist NECK: supple no meningeal signs SPINE/BACK:entire spine nontender CV: S1/S2 noted, no murmurs/rubs/gallops noted LUNGS: Lungs are clear to auscultation bilaterally, no apparent distress ABDOMEN: soft, nontender NEURO: Pt is awake/alert/appropriate, moves all extremitiesx4.  No facial droop.  EXTREMITIES: pulses normal/equal, full ROM, chronic lower extremity edema, right> left SKIN: warm, color normal PSYCH: Poor eye contact ED Results / Procedures / Treatments   Labs (all labs ordered are listed, but only abnormal results are displayed) Labs Reviewed  COMPREHENSIVE METABOLIC PANEL - Abnormal; Notable for the following components:      Result Value   Sodium 128 (*)    Chloride 94 (*)    Glucose, Bld 122 (*)    All other components within normal limits  SARS CORONAVIRUS 2 (TAT 6-24 HRS)  ETHANOL  CBC  RAPID URINE DRUG SCREEN, HOSP PERFORMED  EKG EKG Interpretation  Date/Time:  Saturday Dec 29 2019 07:11:17 EDT Ventricular Rate:  68 PR Interval:  188 QRS Duration: 122 QT Interval:  434 QTC Calculation: 461 R Axis:   27 Text Interpretation: Sinus rhythm with occasional Premature ventricular complexes Inferior infarct , age undetermined Cannot rule out Anterior infarct , age undetermined Abnormal ECG Confirmed by Ripley Fraise [phone]) on 12/29/2019 7:14:29 AM   Radiology No results  found.  Procedures Procedures  Medications Ordered in ED Medications - No data to display  ED Course  I have reviewed the triage vital signs and the nursing notes.  Pertinent labs  results that were available during my care of the patient were reviewed by me and considered in my medical decision making (see chart for details).    MDM Rules/Calculators/A&P                      6:57 AM Per IVC paperwork, patient was placed under involuntary commitment due to pulling out a gun on an adult protective Music therapist.  Patient also has reported hallucinations and also was claiming the FBI came to his house.  At this time, patient is awake and alert answers most questions appropriately but does have poor eye contact.  He reports there was a misunderstanding with Adult Scientist, forensic. Plan for psychiatric consultation. Pt with chronic hyponatremia Pt otherwise stable  The patient has been placed in psychiatric observation due to the need to provide a safe environment for the patient while obtaining psychiatric consultation and evaluation, as well as ongoing medical and medication management to treat the patient's condition.  The patient has been placed under full IVC at this time.  Final Clinical Impression(s) / ED Diagnoses Final diagnoses:  Mood disorder [facility])    Rx / DC Orders ED Discharge Orders    None       Ripley Fraise, MD 12/29/19 [phone]

## 2019-12-29 NOTE — BH Assessment (Signed)
[facility] Assessment Progress Note Case was staffed with Reita Cliche DNP who also evaluated patient and recommended a inpatient admission (Geropsychiatry) for stabilization.

## 2019-12-29 NOTE — Progress Notes (Signed)
Patient meets criteria for inpatient treatment. No appropriate or available beds at [facility]. CSW faxed referrals to the following facilities for review:  [facility]   [facility]    TTS will continue to seek bed placement.  [name] Guerry Bruin, MSW, [place] Work/Disposition Phone: [phone] Fax: [phone]

## 2019-12-30 DIAGNOSIS — F39 Unspecified mood [affective] disorder: Secondary | ICD-10-CM | POA: Diagnosis not present

## 2019-12-30 DIAGNOSIS — F333 Major depressive disorder, recurrent, severe with psychotic symptoms: Secondary | ICD-10-CM

## 2019-12-30 LAB — SARS CORONAVIRUS 2 (TAT 6-24 HRS): SARS Coronavirus 2: NEGATIVE

## 2019-12-30 NOTE — ED Notes (Signed)
Pt has been alert and oriented this shift. Confusion noted. Cooperative this shift. Medication compliant. Pt can ambulate with cane, unsteady. Pt can feed self.

## 2019-12-30 NOTE — Progress Notes (Signed)
Received [name] at the change of shift awake in his room watching the TV with the sitter at the bedside. He stated the social workers came to his home and said he is nuts and brought him to the hospital. He is c/o restless leg syndrome. Paper work faxed to [employer] [phone]. And went through after several attempts although the confirmation switch was turned off on the fax machine. The patient was assissted sever times throughout the night to the bathroom without incident.

## 2019-12-30 NOTE — BH Assessment (Signed)
[name] called requesting pt's chest x-ray, COVID, UA, and IVC ppwk be faxed to: [phone]. Contacted pt's nurse, Mechele Claude, RN, at [phone], who stated she will fax the information at her earliest convenience.

## 2019-12-30 NOTE — Consult Note (Addendum)
[facility] Face-to-Face Psychiatry Consult   Reason for Consult:  Paranoia, delusions Referring Physician:  EDP Patient Identification: Cleburne Savini MRN:  [id number] Principal Diagnosis: Major depressive disorder, recurrent episode, severe, with psychotic behavior ([place]) Diagnosis:  Principal Problem:   Major depressive disorder, recurrent episode, severe, with psychotic behavior ([place]) Active Problems:   Paranoia ([place])   Delusional disorder ([place])   Total Time spent with patient: 1 hour  Subjective:   Mohmmad Saleeby is a 74 y.o. male patient admitted with paranoia, delusions.  Patient seen and evaluated in person.  He continues to believe the FBI are after him.  He is pleasant and cooperative on assessment.  Continues to have delusions and paranoia that people are after him.  Prior to admission he was paranoid to the point of pulling a gun and admits that the police thought that he was going to use himself.  He also had been sleeping in his car because he was afraid the FBI are communicating with him via the microwave.  Continues to meet criteria for inpatient hospitalization  HPI per TTS:  Fedrick Cefalu is an 74 y.o. male that presents this date with IVC. Per IVC patient has been previously diagnosed with MDD, GAD, memory issues and a delusional disorder. A welfare check was initiated due to patient sleeping in his car at night and having hallucinations associated with ongoing paranoia and claims individuals broke into his home and took his medications. When the APS worker came to check on individual he pulled a gun on that individual. He is a danger to himself and others. Patient denies content of IVC during assessment. Patient denies any S/I, H/I or AVH. Patient appears to be disorganized and gives random answers not associated with assessment questions. Patient denies any current mental health symptoms or diagnoses. Patient per chart review has a history of delusions and paranoia. Patient  denies any SA history.     Per chart review patient has been prescribed mental health medications that are managed by [facility]. Per chart review that note of 10/23/19 [facility] notes. "Patient is having some acute health problems. See ROS. He's overwhelmed b/c of them. States he could cry anytime. He is able to enjoy things when he can. He used to go to [place] almost daily but now because of COVID he'snot able to sit in there and talk to other people. So he does not go very much. His energy and motivation have decreased, but mostly due to his physical health problems. He deals with chronic pain which is somewhat relieved with his current medications. He sees pain management. He denies suicidal or homicidal thoughts.  Sleeps ok "when nobody is bothering me." Says 'they'restill bothering me. I'm going to go to our new Representative and file a human rights complaint about them. (meaning the FBI) They've used microwave on me, I think. It all goes back to when I was working in the [employer]." Has moved and is renting a place now and plans to sell his house "after they ruined it." He thinks that the [place] will not be able to find him at his new place that he is renting.  I did not question him further about the FBI. 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 In the past, he has told me the company will do everything in their power to make his  life miserable until he dies. He states that nobody believes him. He has complained to Durenda Hurt in the past and he would not do anything about it. A year or so ago, I prescribed Risperdal for him, hoping it would help him sleep but also get rid of the paranoia. He stopped it because he looked it up online and realized it is an antipsychotic. He came back in and told me that "I am not crazy" and stated he did not need any medication  like that. He has refused any other antipsychotic.   Still has anxiety but the Klonopin does help. It is worse "when the FBI has been in my home and the move all of my stuff around."   Per admission note this date. Pt was sent here by a welfare check that determined patient to be IVC'd. Pt was at home when protective services stopped by with a gun on his waist. Pt told the services to leave so they decided to IVC patient.IVC paperwork states patient diagnosed with PTSD, memory issues, MDD, GAD and delusional disorders and that patient is not taking his medications. Today pulled out a gun on APS workers doing welfare check. Patient sleeping in his car at night on occasions having hallucinations claiming the FBI came to his house and took his medications. Pt has been committed before and is a danger to himself and others.  Past Psychiatric History: depression  Risk to Self: Suicidal Ideation: No Suicidal Intent: No Is patient at risk for suicide?: No Suicidal Plan?: No Access to Means: No What has been your use of drugs/alcohol within the last 12 months?: Denies How many times?: 0 Other Self Harm Risks: (Declining MH issues) Triggers for Past Attempts: (NA) Intentional Self Injurious Behavior: None Risk to Others: Homicidal Ideation: No Thoughts of Harm to Others: No Current Homicidal Intent: No Current Homicidal Plan: No Access to Homicidal Means: No Identified Victim: NA History of harm to others?: No Assessment of Violence: None Noted Violent Behavior Description: NA Does patient have access to weapons?: No Criminal Charges Pending?: No Does patient have a court date: No Prior Inpatient Therapy: Prior Inpatient Therapy: No Prior Outpatient Therapy: Prior Outpatient Therapy: No Does patient have an ACCT team?: No Does patient have Intensive In-House Services?  : No Does patient have [facility] services? : No Does patient have P4CC services?: No  Past Medical History:  Past  Medical History:  Diagnosis Date  . Anemia    NOS  . Anxiety   . 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 BPH (benign prostatic hypertrophy)   . Broken foot Oct. 12, 2013   Right foot Fx  . CAD (coronary artery disease)   . Carotid stenosis, left   . Cellulitis and abscess of leg 03/2018   right leg  . Chronic hyponatremia   . Chronic pain syndrome   . COPD (chronic obstructive pulmonary disease) ([place])   . Depression   . Diabetes mellitus   . Diabetic foot ulcer ([place])   . DJD (degenerative joint disease)    L wrist  . DM2 (diabetes mellitus, type 2) ([place])   . GERD (gastroesophageal reflux disease)   . HLD (hyperlipidemia)   . HTN (hypertension)   . Hyponatremia   . Lumbar disc disease   . MI (mitral incompetence)   . Osteoporosis    tx per [facility], Dr Prudencio Burly, thought due to heavy steriod use after 1978  . Peripheral neuropathy   .  Peripheral vascular disease ([place])   . Spine fracture    hx, multiple  . Varicose veins   . 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 Vitamin D deficiency     Past Surgical History:  Procedure Laterality Date  . ANGIOPLASTY    . aorta bifemoral bypass grafting  09/2010  . CARDIAC CATHETERIZATION N/A 09/19/2015   Procedure: Left Heart Cath and Coronary Angiography;  Surgeon: Adrian Prows, MD;  Location: [place] [facility];  Service: Cardiovascular;  Laterality: N/A;  . CARPAL TUNNEL RELEASE     right 2006/ left 2007  . CATARACT EXTRACTION     left 1996/ right 1997  . CORONARY ARTERY BYPASS GRAFT N/A 09/22/2015   Procedure: CORONARY ARTERY BYPASS GRAFTING (CABG) times four using the right greater saphenous vein harvested endoscopically and the left internal mammary artery.  LIMA-LAD, SEQ SVG-DIAG & OM, SVG-PD.;  Surgeon: [name], MD;  Location: [place];  Service: Open Heart Surgery;  Laterality: N/A;  . I & D EXTREMITY Right 04/07/2018   Procedure: IRRIGATION AND DEBRIDEMENT ABSCESS RIGHT LEG;  Surgeon: Newt Minion, MD;  Location: [place];  Service: Orthopedics;  Laterality: Right;  . L foot open repair jones fracture  2010    5th metetarsal   . left ankle ganglion cyst  1976  . left carotid endarterectomy  03/2011  . left cataract  1996   right - 1997  . left CTS  2006   R CTS - 2007  . left foot surgery  1998   R surgery/fracture - 1999  . left plantar ganglion cystectomy  1979  . left wrist/hand fusion  2008  . [place], 2004  . NASAL SINUS SURGERY     multiple- x8. last 1997 with obliteration  . REPAIR THORACIC AORTA  2017  . right hand fracture  1969  . ROTATOR CUFF REPAIR  2006   R, than repeat 2011, Dr Theda Sers  . TRIGGER FINGER RELEASE Left 11/11/2014   Procedure: LEFT LONG FINGER RELEASE TRIGGER FINGER/A-1 PULLEY;  Surgeon: Milly Jakob, MD;  Location: [place];  Service: Orthopedics;  Laterality: Left;  . vocal surgery  1996   Family History:  Family History  Problem Relation Age of Onset  . Lung cancer Father 2  . Diabetes Mother 3  . Osteoporosis Mother   . Depression Mother   . Bleeding Disorder Mother   . Throat cancer Other        family hx - "bad living" - also lung CA, heart disease and renal failure    . Diabetes Sister   . Suicidality Son 68  . Lung cancer Maternal Grandmother 32  . Diabetes Maternal Grandfather 83  . Heart disease Maternal Grandfather   . Heart attack Paternal Grandmother   . Brain cancer Paternal Grandfather 40   Family Psychiatric  History: see above Social History:  Social History   Substance and Sexual Activity  Alcohol Use Not Currently   Comment: None in 30 years but drank beer before     Social History   Substance and Sexual Activity  Drug Use No    Social History   Socioeconomic History  . Marital status: Divorced    Spouse name: Not on file  . Number of children: Not on file  . Years of education: Not on file  . Highest education level: Not on file  Occupational History  . Not on  file  Tobacco Use  . Smoking status: Former Smoker    Packs/day: 1.50    Years: 30.00    Pack years: 45.00    Types: Cigarettes    Quit date: 11/06/1988    Years since quitting: 31.1  . Smokeless tobacco: Never Used  Substance and Sexual Activity  . Alcohol use: Not Currently    Comment: None in 30 years but drank beer before  . Drug use: No  . Sexual activity: Not Currently  Other Topics Concern  . Not on file  Social History Narrative   Social History      Diet? I eat about what I want.       Do you drink/eat things with caffeine? yes      Marital status?                  divorced                  What year were you married? 1966 to 2007      Do you live in a house, apartment, assisted living, condo, trailer, etc.? [place]      Is it one or more stories? one      How many persons live in your home? Me only      Do you have any pets in your home? (please list) no      Highest level of education completed? Finished high school      Current or past profession: Clinical biochemist in [place] you exercise?               no                       Type & how often?      Advanced Directives      Do you have a living will? no      Do you have a DNR form?     no                             If not, do you want to discuss one?      Do you have signed POA/HPOA for forms? No      Functional Status      Do you have difficulty bathing or dressing yourself? No- shower      Do you have difficulty preparing food or eating? No- Don't want to      Do you have difficulty managing your medications?      Do you have difficulty managing your finances?      Do you have difficulty affording your medications? No- paid for by [employer] [place] Strain:   . Difficulty of Paying Living Expenses:   Food Insecurity:   . Worried About Charity fundraiser in the Last Year:   . Arboriculturist in the Last Year:   Transportation  Needs:   . Film/video editor (Medical):   Marland Kitchen Lack of Transportation (Non-Medical):   Physical Activity:   . Days of Exercise per Week:   . Minutes of Exercise per Session:   Stress:   . Feeling of Stress :   Social Connections:   . Frequency of Communication with Friends and Family:   . Frequency of Social Gatherings with Friends and Family:   .  Attends Religious Services:   . Active Member of Clubs or Organizations:   . Attends Archivist Meetings:   Marland Kitchen Marital Status:    Additional Social History:    Allergies:   Allergies  Allergen Reactions  . Ivp Dye [Iodinated Diagnostic Agents]     Blood Pressure dropped----pt was pre-medicated with 13 hour prep and did fine with pre-meds--amy 03/09/11   . Penicillins Nausea Only    REACTION: stomach pain(IV ok) Has patient had a PCN reaction causing immediate rash, facial/tongue/throat swelling, SOB or lightheadedness with hypotension: No Has patient had a PCN reaction causing severe rash involving mucus membranes or skin necrosis: No Has patient had a PCN reaction that required hospitalization: No Has patient had a PCN reaction occurring within the last 10 years: No If all of the above answers are "NO", then may proceed with Cephalosporin use.   . Sulfa Antibiotics Hives  . Sulfonamide Derivatives Hives and Itching  . Lisinopril Cough  . Metoprolol Nausea And Vomiting  . Statins Other (See Comments)    myalgias  . Propoxyphene N-Acetaminophen Other (See Comments)    Sharp pains- headache    Labs:  Results for orders placed or performed during the hospital encounter of 12/29/19 (from the past 48 hour(s))  Comprehensive metabolic panel     Status: Abnormal   Collection Time: 12/29/19  5:48 AM  Result Value Ref Range   Sodium 128 (L) 135 - 145 mmol/L   Potassium 4.5 3.5 - 5.1 mmol/L   Chloride 94 (L) 98 - 111 mmol/L   CO2 25 22 - 32 mmol/L   Glucose, Bld 122 (H) 70 - 99 mg/dL    Comment: Glucose reference range  applies only to samples taken after fasting for at least 8 hours.   BUN 20 8 - 23 mg/dL   Creatinine, Ser 0.92 0.61 - 1.24 mg/dL   Calcium 8.9 8.9 - 10.3 mg/dL   Total Protein 7.6 6.5 - 8.1 g/dL   Albumin 4.2 3.5 - 5.0 g/dL   AST 20 15 - 41 U/L   ALT 15 0 - 44 U/L   Alkaline Phosphatase 77 38 - 126 U/L   Total Bilirubin 0.7 0.3 - 1.2 mg/dL   GFR calc non Af Amer >60 >60 mL/min   GFR calc Af Amer >60 >60 mL/min   Anion gap 9 5 - 15    Comment: Performed at [facility], [place] [address], [place], [place] [address]  Ethanol     Status: None   Collection Time: 12/29/19  5:48 AM  Result Value Ref Range   Alcohol, Ethyl (B) <10 <10 mg/dL    Comment: (NOTE) Lowest detectable limit for serum alcohol is 10 mg/dL. For medical purposes only. Performed at [facility], [place] [address], [place], [place] [address]   cbc     Status: None   Collection Time: 12/29/19  5:48 AM  Result Value Ref Range   WBC 5.0 4.0 - 10.5 K/uL   RBC 4.34 4.22 - 5.81 MIL/uL   Hemoglobin 13.5 13.0 - 17.0 g/dL   HCT 41.0 39.0 - 52.0 %   MCV 94.5 80.0 - 100.0 fL   MCH 31.1 26.0 - 34.0 pg   MCHC 32.9 30.0 - 36.0 g/dL   RDW 14.9 11.5 - 15.5 %   Platelets 208 150 - 400 K/uL   nRBC 0.0 0.0 - 0.2 %    Comment: Performed at [facility], [address] [place], [place] [address]  Rapid urine drug screen (hospital performed)     Status: Abnormal   Collection Time: 12/29/19  6:00 AM  Result Value Ref Range   Opiates POSITIVE (A) NONE DETECTED   Cocaine NONE DETECTED NONE DETECTED   Benzodiazepines NONE DETECTED NONE DETECTED   Amphetamines NONE DETECTED NONE DETECTED   Tetrahydrocannabinol NONE DETECTED NONE DETECTED   Barbiturates NONE DETECTED NONE DETECTED    Comment: (NOTE) DRUG SCREEN FOR MEDICAL PURPOSES ONLY.  IF CONFIRMATION IS NEEDED FOR ANY PURPOSE, NOTIFY LAB WITHIN 5 DAYS. LOWEST DETECTABLE LIMITS FOR URINE DRUG SCREEN Drug Class                      Cutoff (ng/mL) Amphetamine and metabolites    1000 Barbiturate and metabolites    200 Benzodiazepine                 449 Tricyclics and metabolites     300 Opiates and metabolites        300 Cocaine and metabolites        300 THC                            50 Performed at [facility], [place] [address] [place], [place] [address]   SARS CORONAVIRUS 2 (TAT 6-24 HRS) Nasopharyngeal Nasopharyngeal Swab     Status: None   Collection Time: 12/29/19  6:13 AM   Specimen: Nasopharyngeal Swab  Result Value Ref Range   SARS Coronavirus 2 NEGATIVE NEGATIVE    Comment: (NOTE) SARS-CoV-2 target nucleic acids are NOT DETECTED. The SARS-CoV-2 RNA is generally detectable in upper and lower respiratory specimens during the acute phase of infection. Negative results do not preclude SARS-CoV-2 infection, do not rule out co-infections with other pathogens, and should not be used as the sole basis for treatment or other patient management decisions. Negative results must be combined with clinical observations, patient history, and epidemiological information. The expected result is Negative. Fact Sheet for Patients: SugarRoll.be Fact Sheet for Healthcare Providers: https://www.woods-mathews.com/ This test is not yet approved or cleared by the [place] FDA and  has been authorized for detection and/or diagnosis of SARS-CoV-2 by FDA under an Emergency Use Authorization (EUA). This EUA will remain  in effect (meaning this test can be used) for the duration of the COVID-19 declaration under Section 56 4(b)(1) of the Act, 21 U.S.C. section 360bbb-3(b)(1), unless the authorization is terminated or revoked sooner. Performed at [facility], [place] [address], [place], [place] [address]     Current Facility-Administered Medications  Medication Dose Route Frequency Provider Last Rate Last Admin  . albuterol (VENTOLIN HFA) 108 (90  Base) MCG/ACT inhaler 2 puff  2 puff Inhalation Q6H PRN Lacretia Leigh, MD      . Derrill Memo ON 12/31/2019] Alirocumab SOAJ 75 mg  75 mg Subcutaneous Q14 Days Lacretia Leigh, MD      . aspirin EC tablet 81 mg  81 mg Oral Daily Lacretia Leigh, MD   81 mg at 12/30/19 1031  . carvedilol (COREG) tablet 6.25 mg  6.25 mg Oral BID Lacretia Leigh, MD   6.25 mg at 12/30/19 1027  . celecoxib (CELEBREX) capsule 200 mg  200 mg Oral BID Lacretia Leigh, MD   200 mg at 12/30/19 1029  . cholecalciferol (VITAMIN D3) tablet 5,000 Units  5,000 Units Oral Daily Lacretia Leigh, MD   5,000 Units at  12/30/19 1028  . escitalopram (LEXAPRO) tablet 10 mg  10 mg Oral Daily Patrecia Pour, NP   10 mg at 12/30/19 1030  . fentaNYL (DURAGESIC) 25 MCG/HR 1 patch  1 patch Transdermal Q72H Lacretia Leigh, MD   1 patch at 12/29/19 1738  . furosemide (LASIX) tablet 20 mg  20 mg Oral BID Lacretia Leigh, MD   Stopped at 12/30/19 1739  . gabapentin (NEURONTIN) capsule 400-800 mg  400-800 mg Oral See admin instructions Lacretia Leigh, MD   400 mg at 12/29/19 1844  . hydrocortisone 1 % ointment   Topical Daily PRN Lacretia Leigh, MD      . isosorbide mononitrate (IMDUR) 24 hr tablet 60 mg  60 mg Oral Daily Lacretia Leigh, MD   60 mg at 12/30/19 1029  . linagliptin (TRADJENTA) tablet 5 mg  5 mg Oral Daily Lacretia Leigh, MD   5 mg at 12/30/19 1029  . multivitamin with minerals tablet 1 tablet  1 tablet Oral Daily Lacretia Leigh, MD   1 tablet at 12/30/19 1028  . nitroGLYCERIN (NITROSTAT) SL tablet 0.4 mg  0.4 mg Sublingual Q5 min PRN Lacretia Leigh, MD      . oxyCODONE-acetaminophen (PERCOCET/ROXICET) 5-325 MG per tablet 1 tablet  1 tablet Oral Q8H PRN Lacretia Leigh, MD       And  . oxyCODONE (Oxy IR/ROXICODONE) immediate release tablet 5 mg  5 mg Oral Q8H PRN Lacretia Leigh, MD      . pantoprazole (PROTONIX) EC tablet 40 mg  40 mg Oral Daily Lacretia Leigh, MD   40 mg at 12/30/19 1030  . pravastatin (PRAVACHOL) tablet 20 mg  20 mg Oral  Daily Lacretia Leigh, MD   20 mg at 12/29/19 1737  . risperiDONE (RISPERDAL) tablet 0.5 mg  0.5 mg Oral BID Patrecia Pour, NP   0.5 mg at 12/30/19 1030  . rOPINIRole (REQUIP) tablet 1 mg  1 mg Oral QHS Lacretia Leigh, MD   1 mg at 12/29/19 2231  . sacubitril-valsartan (ENTRESTO) 49-51 mg per tablet  1 tablet Oral BID Lacretia Leigh, MD   1 tablet at 12/30/19 1028  . senna-docusate (Senokot-S) tablet 2 tablet  2 tablet Oral BID Lacretia Leigh, MD   2 tablet at 12/30/19 1028  . tamsulosin (FLOMAX) capsule 0.4 mg  0.4 mg Oral Daily Lacretia Leigh, MD   0.4 mg at 12/30/19 1029  . ticagrelor (BRILINTA) tablet 90 mg  90 mg Oral BID Lacretia Leigh, MD   90 mg at 12/30/19 1029   Current Outpatient Medications  Medication Sig Dispense Refill  . albuterol (PROAIR HFA) 108 (90 Base) MCG/ACT inhaler Inhale 2 puffs into the lungs every 6 (six) hours as needed for wheezing or shortness of breath.    Marland Kitchen aspirin EC 81 MG tablet Take 81 mg by mouth daily.    Marland Kitchen BRILINTA 90 MG TABS tablet Take 90 mg by mouth 2 (two) times daily.    Marland Kitchen buPROPion (WELLBUTRIN XL) 150 MG 24 hr tablet Take 300 mg by mouth daily.    . carvedilol (COREG) 6.25 MG tablet Take 6.25 mg by mouth 2 (two) times daily.    . celecoxib (CELEBREX) 200 MG capsule TAKE 1 CAPSULE(200 MG) BY MOUTH DAILY (Patient taking differently: Take 200 mg by mouth 2 (two) times daily. ) 30 capsule 0  . ENTRESTO 49-51 MG Take 1 tablet by mouth 2 (two) times daily.    . fentaNYL (DURAGESIC) 25 MCG/HR Place 1 patch onto the skin every  3 (three) days.    . furosemide (LASIX) 20 MG tablet TAKE 1 TABLET BY MOUTH TWICE DAILY (Patient taking differently: Take 20 mg by mouth 2 (two) times daily. ) 180 tablet 1  . gabapentin (NEURONTIN) 400 MG capsule 1 q am, 2 qhs (Patient taking differently: Take 400-800 mg by mouth See admin instructions. Take 1 capsule (466m) by mouth in the morning and 2 capsules (6068m by mouth at bedtime) 270 capsule 0  . isosorbide mononitrate  (IMDUR) 60 MG 24 hr tablet Take 60 mg by mouth daily.    . Marland KitchenANUVIA 100 MG tablet TAKE 1 TABLET BY MOUTH EVERY DAY (Patient taking differently: Take 100 mg by mouth daily. ) 90 tablet 1  . nitroGLYCERIN (NITROSTAT) 0.4 MG SL tablet PLACE 1 TABLET UNDER THE TONGUE EVERY 5 MINUTES AS NEEDED FOR CHEST PAIN (Patient taking differently: Place 0.4 mg under the tongue every 5 (five) minutes as needed for chest pain. ) 25 tablet 2  . omeprazole (PRILOSEC) 40 MG capsule Take 40 mg by mouth 2 (two) times daily.    . pravastatin (PRAVACHOL) 20 MG tablet Take 20 mg by mouth at bedtime.    . Marland KitchenOPINIRole (REQUIP) 1 MG tablet TAKE 2 TABLETS(2 MG) BY MOUTH AT BEDTIME (Patient taking differently: Take 1 mg by mouth at bedtime. ) 180 tablet 0  . senna-docusate (SENOKOT-S) 8.6-50 MG tablet Take 2 tablets by mouth 2 (two) times daily.    . tamsulosin (FLOMAX) 0.4 MG CAPS capsule Take 1 capsule (0.4 mg total) by mouth 2 (two) times daily. (Patient taking differently: Take 0.4 mg by mouth daily. ) 180 capsule 0  . ACCU-CHEK FASTCLIX LANCETS MISC Use as directed to check blood glucose. Dx: E11.51 102 each 6  . ARIPiprazole (ABILIFY) 2 MG tablet Take 1 tablet (2 mg total) by mouth every morning. (Patient not taking: Reported on 11/23/2019) 30 tablet 1  . Blood Glucose Monitoring Suppl (ACCU-CHEK AVIVA PLUS) w/Device KIT Use as directed to check blood glucose daily. Dx: E11.51 1 kit 0  . Cholecalciferol (VITAMIN D3) 5000 units TABS Take 5,000 Units by mouth daily.    . clonazePAM (KLONOPIN) 0.5 MG tablet TAKE 1 TABLET(0.5 MG) BY MOUTH TWICE DAILY AS NEEDED FOR ANXIETY (Patient taking differently: Take 0.5 mg by mouth 2 (two) times daily as needed for anxiety. ) 60 tablet 0  . desonide (DESOWEN) 0.05 % cream Apply 1 application topically daily as needed (rosacea).     . diclofenac Sodium (VOLTAREN) 1 % GEL Apply 2 g topically 4 (four) times daily. 100 g 2  . glucose blood (ACCU-CHEK AVIVA PLUS) test strip 1 each by Other route as  needed for other. Use as instructed. Dx: E11.51 100 each 6  . IRON PO Take 1 capsule by mouth daily.    . Marland Kitchenidocaine (LIDODERM) 5 % Place 1 patch onto the skin daily. Remove & Discard patch within 12 hours or as directed by MD 30 patch 0  . losartan (COZAAR) 25 MG tablet TAKE 1 TABLET DAILY (Patient not taking: Reported on 11/23/2019) 90 tablet 0  . Multiple Vitamin (MULTIVITAMIN WITH MINERALS) TABS tablet Take 1 tablet by mouth daily.    . Marland KitchenARCAN 4 MG/0.1ML LIQD nasal spray kit Place 4 mg into the nose as needed (opioid reversal).     . Marland KitchenxyCODONE-acetaminophen (PERCOCET) 10-325 MG tablet Take 1 tablet by mouth every 12 (twelve) hours as needed for pain.    . pantoprazole (PROTONIX) 40 MG tablet Take 1 tablet (40  mg total) by mouth 2 (two) times daily. (Patient not taking: Reported on 12/29/2019) 30 tablet 3  . potassium chloride (K-DUR) 10 MEQ tablet Take 1 tablet (10 mEq total) by mouth daily. (Patient not taking: Reported on 12/29/2019)    . PRALUENT 75 MG/ML SOAJ Inject 75 mg into the skin every 14 (fourteen) days.    . silver sulfADIAZINE (SILVADENE) 1 % cream Apply 1 application topically daily. Apply to affected area daily (Patient not taking: Reported on 06/07/2019) 450 g 0  . zonisamide (ZONEGRAN) 50 MG capsule TAKE 3 CAPSULES(150 MG) BY MOUTH AT BEDTIME (Patient not taking: Reported on 06/07/2019) 270 capsule 1    Musculoskeletal: Strength & Muscle Tone: within normal limits Gait & Station: normal Patient leans: N/A  Psychiatric Specialty Exam: Physical Exam  Nursing note and vitals reviewed. Constitutional: He is oriented to person, place, and time. He appears well-developed and well-nourished.  HENT:  Head: Normocephalic.  Respiratory: Effort normal.  Musculoskeletal:        General: Normal range of motion.     Cervical back: Normal range of motion.  Neurological: He is alert and oriented to person, place, and time.  Psychiatric: His speech is normal. Judgment normal. His mood  appears anxious. He is actively hallucinating. Thought content is paranoid and delusional. Cognition and memory are normal. He exhibits a depressed mood.    Review of Systems  Psychiatric/Behavioral: Positive for hallucinations and sleep disturbance.  All other systems reviewed and are negative.   Blood pressure (!) 106/54, pulse 85, temperature 98.9 F (37.2 C), temperature source Oral, resp. rate 15, SpO2 98 %.There is no height or weight on file to calculate BMI.  General Appearance: Casual  Eye Contact:  Fair  Speech:  Normal Rate  Volume:  Normal  Mood:  Anxious and Depressed  Affect:  Congruent  Thought Process:  Disorganized at times  Orientation:  Full (Time, Place, and Person)  Thought Content:  Delusions and Hallucinations: Visual, paranoia  Suicidal Thoughts:  No  Homicidal Thoughts:  No  Memory:  Immediate;   Fair Recent;   Fair Remote;   Fair  Judgement:  Impaired  Insight:  Lacking  Psychomotor Activity:  Normal  Concentration:  Concentration: Fair and Attention Span: Fair  Recall:  [employer] of Knowledge:  Fair  Language:  Good  Akathisia:  No  Handed:  Right  AIMS (if indicated):     Assets:  Leisure Time Physical Health Resilience  ADL's:  Intact  Cognition:  WNL  Sleep:        Treatment Plan Summary: Daily contact with patient to assess and evaluate symptoms and progress in treatment, Medication management and Plan 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  Waylan Boga, NP 12/30/2019 6:33 PM  Patient seen face-to-face for psychiatric evaluation, chart reviewed and case discussed with the physician extender and developed treatment plan. Reviewed the information documented and agree with the treatment plan. Corena Pilgrim, MD

## 2019-12-31 ENCOUNTER — Emergency Department (HOSPITAL_COMMUNITY): Payer: Medicare Other

## 2019-12-31 ENCOUNTER — Encounter (HOSPITAL_COMMUNITY): Payer: Self-pay | Admitting: Radiology

## 2019-12-31 DIAGNOSIS — F22 Delusional disorders: Secondary | ICD-10-CM

## 2019-12-31 DIAGNOSIS — F39 Unspecified mood [affective] disorder: Secondary | ICD-10-CM | POA: Diagnosis not present

## 2019-12-31 DIAGNOSIS — F333 Major depressive disorder, recurrent, severe with psychotic symptoms: Secondary | ICD-10-CM | POA: Diagnosis not present

## 2019-12-31 LAB — URINALYSIS, ROUTINE W REFLEX MICROSCOPIC
Bilirubin Urine: NEGATIVE
Glucose, UA: 50 mg/dL — AB
Hgb urine dipstick: NEGATIVE
Ketones, ur: NEGATIVE mg/dL
Leukocytes,Ua: NEGATIVE
Nitrite: NEGATIVE
Protein, ur: NEGATIVE mg/dL
Specific Gravity, Urine: 1.009 (ref 1.005–1.030)
pH: 7 (ref 5.0–8.0)

## 2019-12-31 MED ORDER — GABAPENTIN 400 MG PO CAPS
800.0000 mg | ORAL_CAPSULE | Freq: Every day | ORAL | Status: DC
  Administered 2019-12-31: 800 mg via ORAL
  Filled 2019-12-31: qty 2

## 2019-12-31 MED ORDER — GABAPENTIN 400 MG PO CAPS
400.0000 mg | ORAL_CAPSULE | Freq: Every day | ORAL | Status: DC
  Administered 2019-12-31 – 2020-01-01 (×2): 400 mg via ORAL
  Filled 2019-12-31 (×2): qty 1

## 2019-12-31 NOTE — Consult Note (Signed)
[facility] Psych ED Progress Note  12/31/2019 4:29 PM [name]  MRN:  [id number]   Subjective:  [name], 74 y.o., male patient seen via tele psych by this provider, Dr. Dwyane Dee; and chart reviewed on 12/31/19.  On evaluation [name] continues to have some paranoia; but stating it was his choice to sleep in his car "I like sleeping in my car."  Patient states that he has not support or outpatient psychiatric services.  During evaluation [name] is alert/oriented x 4; calm/cooperative; and mood is congruent with affect.  He does not appear to be responding to internal/external stimuli or delusional thoughts.  Patient denies suicidal/self-harm/homicidal ideation, psychosis, and paranoia.  Patient minimizing his situation.      Principal Problem: Major depressive disorder, recurrent episode, severe, with psychotic behavior ([place]) Diagnosis:  Principal Problem:   Major depressive disorder, recurrent episode, severe, with psychotic behavior ([place]) Active Problems:   Paranoia ([place])   Delusional disorder ([place])  Total Time spent with patient: 30 minutes  Past Psychiatric History: See above  Past Medical History:  Past Medical History:  Diagnosis Date  . Anemia    NOS  . Anxiety   . 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 BPH (benign prostatic hypertrophy)   . Broken foot Oct. 12, 2013   Right foot Fx  . CAD (coronary artery disease)   . Carotid stenosis, left   . Cellulitis and abscess of leg 03/2018   right leg  . Chronic hyponatremia   . Chronic pain syndrome   . COPD (chronic obstructive pulmonary disease) ([place])   . Depression   . Diabetes mellitus   . Diabetic foot ulcer ([place])   . DJD (degenerative joint disease)    L wrist  . DM2 (diabetes mellitus, type 2) ([place])   . GERD (gastroesophageal reflux disease)   . HLD (hyperlipidemia)   . HTN (hypertension)   . Hyponatremia   . Lumbar disc disease   . MI (mitral incompetence)   .  Osteoporosis    tx per [facility], Dr Prudencio Burly, thought due to heavy steriod use after 1978  . Peripheral neuropathy   . Peripheral vascular disease ([place])   . Spine fracture    hx, multiple  . Varicose veins   . 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 Vitamin D deficiency     Past Surgical History:  Procedure Laterality Date  . ANGIOPLASTY    . aorta bifemoral bypass grafting  09/2010  . CARDIAC CATHETERIZATION N/A 09/19/2015   Procedure: Left Heart Cath and Coronary Angiography;  Surgeon: Adrian Prows, MD;  Location: [facility];  Service: Cardiovascular;  Laterality: N/A;  . CARPAL TUNNEL RELEASE     right 2006/ left 2007  . CATARACT EXTRACTION     left 1996/ right 1997  . CORONARY ARTERY BYPASS GRAFT N/A 09/22/2015   Procedure: CORONARY ARTERY BYPASS GRAFTING (CABG) times four using the right greater saphenous vein harvested endoscopically and the left internal mammary artery.  LIMA-LAD, SEQ SVG-DIAG & OM, SVG-PD.;  Surgeon: [name], MD;  Location: [place];  Service: Open Heart Surgery;  Laterality: N/A;  . I & D EXTREMITY Right 04/07/2018   Procedure: IRRIGATION AND DEBRIDEMENT ABSCESS RIGHT LEG;  Surgeon: Newt Minion, MD;  Location: [place];  Service: Orthopedics;  Laterality: Right;  . L foot open repair jones fracture  2010    5th  metetarsal   . left ankle ganglion cyst  1976  . left carotid endarterectomy  03/2011  . left cataract  1996   right - 1997  . left CTS  2006   R CTS - 2007  . left foot surgery  1998   R surgery/fracture - 1999  . left plantar ganglion cystectomy  1979  . left wrist/hand fusion  2008  . [place], 2004  . NASAL SINUS SURGERY     multiple- x8. last 1997 with obliteration  . REPAIR THORACIC AORTA  2017  . right hand fracture  1969  . ROTATOR CUFF REPAIR  2006   R, than repeat 2011, Dr Theda Sers  . TRIGGER FINGER RELEASE Left 11/11/2014   Procedure: LEFT LONG FINGER RELEASE TRIGGER FINGER/A-1 PULLEY;  Surgeon: Milly Jakob, MD;  Location: [place];  Service: Orthopedics;  Laterality: Left;  . vocal surgery  1996   Family History:  Family History  Problem Relation Age of Onset  . Lung cancer Father 83  . Diabetes Mother 52  . Osteoporosis Mother   . Depression Mother   . Bleeding Disorder Mother   . Throat cancer Other        family hx - "bad living" - also lung CA, heart disease and renal failure    . Diabetes Sister   . Suicidality Son 28  . Lung cancer Maternal Grandmother 67  . Diabetes Maternal Grandfather 83  . Heart disease Maternal Grandfather   . Heart attack Paternal Grandmother   . Brain cancer Paternal Grandfather 11   Family Psychiatric  History: See above Social History:  Social History   Substance and Sexual Activity  Alcohol Use Not Currently   Comment: None in 30 years but drank beer before     Social History   Substance and Sexual Activity  Drug Use No    Social History   Socioeconomic History  . Marital status: Divorced    Spouse name: Not on file  . Number of children: Not on file  . Years of education: Not on file  . Highest education level: Not on file  Occupational History  . Not on file  Tobacco Use  . Smoking status: Former Smoker    Packs/day: 1.50    Years: 30.00    Pack years: 45.00    Types: Cigarettes    Quit date: 11/06/1988    Years since quitting: 31.1  . Smokeless tobacco: Never Used  Substance and Sexual Activity  . Alcohol use: Not Currently    Comment: None in 30 years but drank beer before  . Drug use: No  . Sexual activity: Not Currently  Other Topics Concern  . Not on file  Social History Narrative   Social History      Diet? I eat about what I want.       Do you drink/eat things with caffeine? yes      Marital status?                  divorced                  What year were you married? 1966 to 2007      Do you live in a house, apartment, assisted living, condo, trailer, etc.? [place]      Is it  one or more stories? one      How many persons live in your home? Me only  Do you have any pets in your home? (please list) no      Highest level of education completed? Finished high school      Current or past profession: Clinical biochemist in [place] you exercise?               no                       Type & how often?      Advanced Directives      Do you have a living will? no      Do you have a DNR form?     no                             If not, do you want to discuss one?      Do you have signed POA/HPOA for forms? No      Functional Status      Do you have difficulty bathing or dressing yourself? No- shower      Do you have difficulty preparing food or eating? No- Don't want to      Do you have difficulty managing your medications?      Do you have difficulty managing your finances?      Do you have difficulty affording your medications? No- paid for by [employer] [place] Strain:   . Difficulty of Paying Living Expenses:   Food Insecurity:   . Worried About Charity fundraiser in the Last Year:   . Arboriculturist in the Last Year:   Transportation Needs:   . Film/video editor (Medical):   Marland Kitchen Lack of Transportation (Non-Medical):   Physical Activity:   . Days of Exercise per Week:   . Minutes of Exercise per Session:   Stress:   . Feeling of Stress :   Social Connections:   . Frequency of Communication with Friends and Family:   . Frequency of Social Gatherings with Friends and Family:   . Attends Religious Services:   . Active Member of Clubs or Organizations:   . Attends Archivist Meetings:   Marland Kitchen Marital Status:     Sleep: Fair  Appetite:  Good  Current Medications: Current Facility-Administered Medications  Medication Dose Route Frequency Provider Last Rate Last Admin  . albuterol (VENTOLIN HFA) 108 (90 Base) MCG/ACT inhaler 2 puff  2 puff Inhalation Q6H PRN Lacretia Leigh, MD      . Alirocumab SOAJ 75 mg  75 mg Subcutaneous Q14 Days Lacretia Leigh, MD      . aspirin EC tablet 81 mg  81 mg Oral Daily Lacretia Leigh, MD   81 mg at 12/31/19 1019  . carvedilol (COREG) tablet 6.25 mg  6.25 mg Oral BID Lacretia Leigh, MD   6.25 mg at 12/31/19 1019  . celecoxib (CELEBREX) capsule 200 mg  200 mg Oral BID Lacretia Leigh, MD   200 mg at 12/31/19 1020  . cholecalciferol (VITAMIN D3) tablet 5,000 Units  5,000 Units Oral Daily Lacretia Leigh, MD   5,000 Units at 12/31/19 1048  . escitalopram (LEXAPRO) tablet 10 mg  10 mg Oral Daily Patrecia Pour, NP   10 mg at 12/31/19 1019  . fentaNYL (DURAGESIC) 25 MCG/HR 1 patch  1 patch Transdermal Q72H Lacretia Leigh, MD  1 patch at 12/29/19 1738  . furosemide (LASIX) tablet 20 mg  20 mg Oral BID Lacretia Leigh, MD   20 mg at 12/31/19 1020  . gabapentin (NEURONTIN) capsule 400 mg  400 mg Oral Daily Lacretia Leigh, MD   400 mg at 12/31/19 1021  . gabapentin (NEURONTIN) capsule 800 mg  800 mg Oral QHS Lacretia Leigh, MD      . hydrocortisone 1 % ointment   Topical Daily PRN Lacretia Leigh, MD      . isosorbide mononitrate (IMDUR) 24 hr tablet 60 mg  60 mg Oral Daily Lacretia Leigh, MD   60 mg at 12/31/19 1020  . linagliptin (TRADJENTA) tablet 5 mg  5 mg Oral Daily Lacretia Leigh, MD   5 mg at 12/31/19 1021  . multivitamin with minerals tablet 1 tablet  1 tablet Oral Daily Lacretia Leigh, MD   1 tablet at 12/31/19 1020  . nitroGLYCERIN (NITROSTAT) SL tablet 0.4 mg  0.4 mg Sublingual Q5 min PRN Lacretia Leigh, MD      . oxyCODONE-acetaminophen (PERCOCET/ROXICET) 5-325 MG per tablet 1 tablet  1 tablet Oral Q8H PRN Lacretia Leigh, MD       And  . oxyCODONE (Oxy IR/ROXICODONE) immediate release tablet 5 mg  5 mg Oral Q8H PRN Lacretia Leigh, MD      . pantoprazole (PROTONIX) EC tablet 40 mg  40 mg Oral Daily Lacretia Leigh, MD   40 mg at 12/31/19 1020  . pravastatin (PRAVACHOL) tablet 20 mg  20 mg Oral Daily Lacretia Leigh, MD   20 mg  at 12/31/19 1019  . risperiDONE (RISPERDAL) tablet 0.5 mg  0.5 mg Oral BID Patrecia Pour, NP   0.5 mg at 12/31/19 1019  . rOPINIRole (REQUIP) tablet 1 mg  1 mg Oral QHS Lacretia Leigh, MD   1 mg at 12/30/19 2129  . sacubitril-valsartan (ENTRESTO) 49-51 mg per tablet  1 tablet Oral BID Lacretia Leigh, MD   1 tablet at 12/31/19 1020  . senna-docusate (Senokot-S) tablet 2 tablet  2 tablet Oral BID Lacretia Leigh, MD   2 tablet at 12/31/19 1019  . tamsulosin (FLOMAX) capsule 0.4 mg  0.4 mg Oral Daily Lacretia Leigh, MD   0.4 mg at 12/31/19 1019  . ticagrelor (BRILINTA) tablet 90 mg  90 mg Oral BID Lacretia Leigh, MD   90 mg at 12/31/19 1020   Current Outpatient Medications  Medication Sig Dispense Refill  . ACCU-CHEK FASTCLIX LANCETS MISC Use as directed to check blood glucose. Dx: E11.51 102 each 6  . albuterol (PROAIR HFA) 108 (90 Base) MCG/ACT inhaler Inhale 2 puffs into the lungs every 6 (six) hours as needed for wheezing or shortness of breath.    . ARIPiprazole (ABILIFY) 2 MG tablet Take 1 tablet (2 mg total) by mouth every morning. (Patient not taking: Reported on 11/23/2019) 30 tablet 1  . aspirin EC 81 MG tablet Take 81 mg by mouth daily.    . Blood Glucose Monitoring Suppl (ACCU-CHEK AVIVA PLUS) w/Device KIT Use as directed to check blood glucose daily. Dx: E11.51 1 kit 0  . BRILINTA 90 MG TABS tablet Take 90 mg by mouth 2 (two) times daily.    Marland Kitchen buPROPion (WELLBUTRIN XL) 150 MG 24 hr tablet Take 300 mg by mouth daily.    . carvedilol (COREG) 6.25 MG tablet Take 6.25 mg by mouth 2 (two) times daily.    . celecoxib (CELEBREX) 200 MG capsule TAKE 1 CAPSULE(200 MG) BY MOUTH DAILY (Patient taking  differently: Take 200 mg by mouth 2 (two) times daily. ) 30 capsule 0  . Cholecalciferol (VITAMIN D3) 5000 units TABS Take 5,000 Units by mouth daily.    . clonazePAM (KLONOPIN) 0.5 MG tablet TAKE 1 TABLET(0.5 MG) BY MOUTH TWICE DAILY AS NEEDED FOR ANXIETY (Patient taking differently: Take 0.5 mg by  mouth 2 (two) times daily as needed for anxiety. ) 60 tablet 0  . desonide (DESOWEN) 0.05 % cream Apply 1 application topically daily as needed (rosacea).     . diclofenac Sodium (VOLTAREN) 1 % GEL Apply 2 g topically 4 (four) times daily. 100 g 2  . ENTRESTO 49-51 MG Take 1 tablet by mouth 2 (two) times daily.    . fentaNYL (DURAGESIC) 25 MCG/HR Place 1 patch onto the skin every 3 (three) days.    . furosemide (LASIX) 20 MG tablet TAKE 1 TABLET BY MOUTH TWICE DAILY (Patient taking differently: Take 20 mg by mouth 2 (two) times daily. ) 180 tablet 1  . gabapentin (NEURONTIN) 400 MG capsule 1 q am, 2 qhs (Patient taking differently: Take 400-800 mg by mouth See admin instructions. Take 1 capsule (429m) by mouth in the morning and 2 capsules (6030m by mouth at bedtime) 270 capsule 0  . glucose blood (ACCU-CHEK AVIVA PLUS) test strip 1 each by Other route as needed for other. Use as instructed. Dx: E11.51 100 each 6  . IRON PO Take 1 capsule by mouth daily.    . isosorbide mononitrate (IMDUR) 60 MG 24 hr tablet Take 60 mg by mouth daily.    . Marland KitchenANUVIA 100 MG tablet TAKE 1 TABLET BY MOUTH EVERY DAY (Patient taking differently: Take 100 mg by mouth daily. ) 90 tablet 1  . lidocaine (LIDODERM) 5 % Place 1 patch onto the skin daily. Remove & Discard patch within 12 hours or as directed by MD 30 patch 0  . losartan (COZAAR) 25 MG tablet TAKE 1 TABLET DAILY (Patient not taking: Reported on 11/23/2019) 90 tablet 0  . Multiple Vitamin (MULTIVITAMIN WITH MINERALS) TABS tablet Take 1 tablet by mouth daily.    . Marland KitchenARCAN 4 MG/0.1ML LIQD nasal spray kit Place 4 mg into the nose as needed (opioid reversal).     . nitroGLYCERIN (NITROSTAT) 0.4 MG SL tablet PLACE 1 TABLET UNDER THE TONGUE EVERY 5 MINUTES AS NEEDED FOR CHEST PAIN (Patient taking differently: Place 0.4 mg under the tongue every 5 (five) minutes as needed for chest pain. ) 25 tablet 2  . omeprazole (PRILOSEC) 40 MG capsule Take 40 mg by mouth 2 (two) times  daily.    . Marland KitchenxyCODONE-acetaminophen (PERCOCET) 10-325 MG tablet Take 1 tablet by mouth every 12 (twelve) hours as needed for pain.    . pantoprazole (PROTONIX) 40 MG tablet Take 1 tablet (40 mg total) by mouth 2 (two) times daily. (Patient not taking: Reported on 12/29/2019) 30 tablet 3  . potassium chloride (K-DUR) 10 MEQ tablet Take 1 tablet (10 mEq total) by mouth daily. (Patient not taking: Reported on 12/29/2019)    . PRALUENT 75 MG/ML SOAJ Inject 75 mg into the skin every 14 (fourteen) days.    . pravastatin (PRAVACHOL) 20 MG tablet Take 20 mg by mouth at bedtime.    . Marland KitchenOPINIRole (REQUIP) 1 MG tablet TAKE 2 TABLETS(2 MG) BY MOUTH AT BEDTIME (Patient taking differently: Take 1 mg by mouth at bedtime. ) 180 tablet 0  . senna-docusate (SENOKOT-S) 8.6-50 MG tablet Take 2 tablets by mouth 2 (two) times daily.    .Marland Kitchen  silver sulfADIAZINE (SILVADENE) 1 % cream Apply 1 application topically daily. Apply to affected area daily (Patient not taking: Reported on 06/07/2019) 450 g 0  . tamsulosin (FLOMAX) 0.4 MG CAPS capsule Take 1 capsule (0.4 mg total) by mouth 2 (two) times daily. (Patient taking differently: Take 0.4 mg by mouth daily. ) 180 capsule 0  . zonisamide (ZONEGRAN) 50 MG capsule TAKE 3 CAPSULES(150 MG) BY MOUTH AT BEDTIME (Patient not taking: Reported on 06/07/2019) 270 capsule 1    Lab Results:  Results for orders placed or performed during the hospital encounter of 12/29/19 (from the past 48 hour(s))  Urinalysis, Routine w reflex microscopic     Status: Abnormal   Collection Time: 12/31/19  1:55 PM  Result Value Ref Range   Color, Urine YELLOW YELLOW   APPearance CLOUDY (A) CLEAR   Specific Gravity, Urine 1.009 1.005 - 1.030   pH 7.0 5.0 - 8.0   Glucose, UA 50 (A) NEGATIVE mg/dL   Hgb urine dipstick NEGATIVE NEGATIVE   Bilirubin Urine NEGATIVE NEGATIVE   Ketones, ur NEGATIVE NEGATIVE mg/dL   Protein, ur NEGATIVE NEGATIVE mg/dL   Nitrite NEGATIVE NEGATIVE   Leukocytes,Ua NEGATIVE  NEGATIVE    Comment: Performed at [facility] [address] [place], [place] [address]    Blood Alcohol level:  Lab Results  Component Value Date   ETH <10 12/29/2019    Physical Findings: AIMS:  , ,  ,  ,    CIWA:    COWS:     Musculoskeletal: Strength & Muscle Tone: within normal limits Gait & Station: normal Patient leans: N/A  Psychiatric Specialty Exam: Physical Exam Vitals and nursing note reviewed. Exam conducted with a chaperone present.  Pulmonary:     Effort: Pulmonary effort is normal.  Neurological:     Mental Status: He is alert.  Psychiatric:        Attention and Perception: Attention normal.        Mood and Affect: Mood is anxious.        Behavior: Behavior normal. Behavior is cooperative.        Judgment: Judgment is impulsive.     Review of Systems  Blood pressure (!) 112/50, pulse 68, temperature 97.6 F (36.4 C), temperature source Oral, resp. rate 16, SpO2 (!) 88 %.There is no height or weight on file to calculate BMI.  General Appearance: Casual  Eye Contact:  Good  Speech:  Clear and Coherent and Normal Rate  Volume:  Normal  Mood:  Anxious  Affect:  Congruent  Thought Process:  Coherent, Disorganized and Descriptions of Associations: Tangential  Orientation:  Full (Time, Place, and Person)  Thought Content:  Delusions  Suicidal Thoughts:  No  Homicidal Thoughts:  No  Memory:  Immediate;   Fair Recent;   Fair  Judgement:  Fair  Insight:  Fair  Psychomotor Activity:  Normal  Concentration:  Concentration: Fair and Attention Span: Fair  Recall:  [employer] of Knowledge:  Fair  Language:  Good  Akathisia:  No  Handed:  Right  AIMS (if indicated):     Assets:  Communication Skills Desire for Improvement  ADL's:  Intact  Cognition:  WNL  Sleep:      Treatment Plan Summary: Daily contact with patient to assess and evaluate symptoms and progress in treatment, Medication management and Plan Inpatient psychiatric  treatement   Disposition: Recommend psychiatric Inpatient admission when medically cleared.  Jillien Yakel, NP 12/31/2019, 4:29 PM

## 2019-12-31 NOTE — Progress Notes (Signed)
CSW received a call from [place] at [facility] stating the patient has been offered a bed and has been accepted and that the pt can arrive on 01/01/20.  The pt's accepting doctor is Dr. Ananias Pilgrim.  The room number will be 417-B .  The number for report is [phone].  CSW will update RN.  Of note: Pt is IVC's and will be transported via Sheriff's Deputy at ph: [phone] or [facility]. Binnie Vonderhaar, Latanya Presser, LCAS Clinical Social Worker Ph: [phone]

## 2019-12-31 NOTE — ED Notes (Signed)
Calm and pleasant. Able to go to the bathroom with cane and staff very close but not assisting.

## 2019-12-31 NOTE — Progress Notes (Addendum)
Patient's BP 118/45 MAP 65 with coreg 6.25 mg PO ordered at HS. Paged Dr. Roslynn Amble. He stated to hold HS dose. Will continue to monitor.

## 2019-12-31 NOTE — BH Assessment (Addendum)
[facility] Assessment Progress Note  Per Hampton Abbot, MD, this pt requires psychiatric hospitalization at this time.  Pt presents under IVC initiated by pt's APS worker and upheld by EDP Charlotta Newton, MD.  This writer has been in communication with Caryl Pina at [facility] throughout the day.  They appear to intend to accept pt, but have not yet agreed to as of this writing.    Jalene Mullet, [place] Coordinator [phone]

## 2020-01-01 DIAGNOSIS — F39 Unspecified mood [affective] disorder: Secondary | ICD-10-CM | POA: Diagnosis not present

## 2020-01-01 NOTE — ED Notes (Signed)
Pt off unit to [facility] per provider. Pt alert and oriented, calm, cooperative, no s/s of distress. DC information given to sheriff for facility. Belongings given to sheriff for facility .  Pt off unit in w/c, escorted and transported by sheriff.

## 2020-01-01 NOTE — ED Notes (Signed)
Call [facility] to give report and set up admission, they in meeting, they are going to call back

## 2020-01-01 NOTE — Discharge Summary (Signed)
  Patient to be transferred to [facility] for inpatient psychiatric treatment

## 2020-01-03 ENCOUNTER — Ambulatory Visit: Payer: Medicare Other | Admitting: Physician Assistant

## 2020-01-03 DIAGNOSIS — Z955 Presence of coronary angioplasty implant and graft: Secondary | ICD-10-CM | POA: Insufficient documentation

## 2020-01-03 DIAGNOSIS — E785 Hyperlipidemia, unspecified: Secondary | ICD-10-CM | POA: Insufficient documentation

## 2020-01-03 DIAGNOSIS — I2510 Atherosclerotic heart disease of native coronary artery without angina pectoris: Secondary | ICD-10-CM | POA: Insufficient documentation

## 2020-01-05 MED ORDER — SACUBITRIL-VALSARTAN 49-51 MG PO TABS
1.00 | ORAL_TABLET | ORAL | Status: DC
Start: 2020-01-05 — End: 2020-01-05

## 2020-01-05 MED ORDER — SITAGLIPTIN PHOSPHATE 50 MG PO TABS
100.00 | ORAL_TABLET | ORAL | Status: DC
Start: 2020-01-06 — End: 2020-01-05

## 2020-01-05 MED ORDER — FUROSEMIDE 20 MG PO TABS
20.00 | ORAL_TABLET | ORAL | Status: DC
Start: 2020-01-05 — End: 2020-01-05

## 2020-01-05 MED ORDER — AMIODARONE HCL 200 MG PO TABS
200.00 | ORAL_TABLET | ORAL | Status: DC
Start: 2020-01-05 — End: 2020-01-05

## 2020-01-05 MED ORDER — SODIUM CHLORIDE 0.9 % IV SOLN
10.00 | INTRAVENOUS | Status: DC
Start: ? — End: 2020-01-05

## 2020-01-05 MED ORDER — CLOPIDOGREL BISULFATE 75 MG PO TABS
75.00 | ORAL_TABLET | ORAL | Status: DC
Start: 2020-01-06 — End: 2020-01-05

## 2020-01-05 MED ORDER — THERA PO TABS
1.00 | ORAL_TABLET | ORAL | Status: DC
Start: 2020-01-06 — End: 2020-01-05

## 2020-01-05 MED ORDER — GENERIC EXTERNAL MEDICATION
Status: DC
Start: ? — End: 2020-01-05

## 2020-01-05 MED ORDER — TRAZODONE HCL 50 MG PO TABS
50.00 | ORAL_TABLET | ORAL | Status: DC
Start: ? — End: 2020-01-05

## 2020-01-05 MED ORDER — TAMSULOSIN HCL 0.4 MG PO CAPS
0.40 | ORAL_CAPSULE | ORAL | Status: DC
Start: 2020-01-06 — End: 2020-01-05

## 2020-01-05 MED ORDER — GABAPENTIN 400 MG PO CAPS
800.00 | ORAL_CAPSULE | ORAL | Status: DC
Start: 2020-01-05 — End: 2020-01-05

## 2020-01-05 MED ORDER — RISPERIDONE 0.5 MG PO TABS
0.50 | ORAL_TABLET | ORAL | Status: DC
Start: 2020-01-05 — End: 2020-01-05

## 2020-01-05 MED ORDER — GABAPENTIN 400 MG PO CAPS
400.00 | ORAL_CAPSULE | ORAL | Status: DC
Start: 2020-01-06 — End: 2020-01-05

## 2020-01-05 MED ORDER — ACETAMINOPHEN 325 MG PO TABS
650.00 | ORAL_TABLET | ORAL | Status: DC
Start: ? — End: 2020-01-05

## 2020-01-05 MED ORDER — BUPROPION HCL ER (XL) 150 MG PO TB24
300.00 | ORAL_TABLET | ORAL | Status: DC
Start: 2020-01-06 — End: 2020-01-05

## 2020-01-05 MED ORDER — CELECOXIB 200 MG PO CAPS
200.00 | ORAL_CAPSULE | ORAL | Status: DC
Start: 2020-01-05 — End: 2020-01-05

## 2020-01-05 MED ORDER — CHOLECALCIFEROL 25 MCG (1000 UT) PO TABS
1000.00 | ORAL_TABLET | ORAL | Status: DC
Start: 2020-01-06 — End: 2020-01-05

## 2020-01-05 MED ORDER — POLYETHYLENE GLYCOL 3350 17 GM/SCOOP PO POWD
17.00 | ORAL | Status: DC
Start: ? — End: 2020-01-05

## 2020-01-05 MED ORDER — ISOSORBIDE MONONITRATE ER 30 MG PO TB24
60.00 | ORAL_TABLET | ORAL | Status: DC
Start: 2020-01-05 — End: 2020-01-05

## 2020-01-05 MED ORDER — ROPINIROLE HCL 1 MG PO TABS
2.00 | ORAL_TABLET | ORAL | Status: DC
Start: 2020-01-05 — End: 2020-01-05

## 2020-01-05 MED ORDER — PANTOPRAZOLE SODIUM 40 MG PO TBEC
40.00 | DELAYED_RELEASE_TABLET | ORAL | Status: DC
Start: 2020-01-06 — End: 2020-01-05

## 2020-01-05 MED ORDER — ASPIRIN 81 MG PO TBEC
81.00 | DELAYED_RELEASE_TABLET | ORAL | Status: DC
Start: 2020-01-06 — End: 2020-01-05

## 2020-01-05 MED ORDER — OXYCODONE HCL 5 MG PO TABS
5.00 | ORAL_TABLET | ORAL | Status: DC
Start: ? — End: 2020-01-05

## 2020-01-05 MED ORDER — ALUM & MAG HYDROXIDE-SIMETH 200-200-20 MG/5ML PO SUSP
15.00 | ORAL | Status: DC
Start: ? — End: 2020-01-05

## 2020-01-05 MED ORDER — SENNOSIDES 8.6 MG PO TABS
2.00 | ORAL_TABLET | ORAL | Status: DC
Start: 2020-01-06 — End: 2020-01-05

## 2020-01-05 MED ORDER — FENTANYL 25 MCG/HR TD PT72
1.00 | MEDICATED_PATCH | TRANSDERMAL | Status: DC
Start: 2020-01-07 — End: 2020-01-05

## 2020-01-05 MED ORDER — SERTRALINE HCL 25 MG PO TABS
25.00 | ORAL_TABLET | ORAL | Status: DC
Start: 2020-01-06 — End: 2020-01-05

## 2020-01-05 MED ORDER — ALBUTEROL SULFATE HFA 108 (90 BASE) MCG/ACT IN AERS
2.00 | INHALATION_SPRAY | RESPIRATORY_TRACT | Status: DC
Start: ? — End: 2020-01-05

## 2020-01-05 MED ORDER — PRAVASTATIN SODIUM 20 MG PO TABS
20.00 | ORAL_TABLET | ORAL | Status: DC
Start: 2020-01-06 — End: 2020-01-05

## 2020-01-05 MED ORDER — NITROGLYCERIN 0.4 MG SL SUBL
0.40 | SUBLINGUAL_TABLET | SUBLINGUAL | Status: DC
Start: ? — End: 2020-01-05

## 2020-01-05 MED ORDER — CARVEDILOL 6.25 MG PO TABS
6.25 | ORAL_TABLET | ORAL | Status: DC
Start: 2020-01-05 — End: 2020-01-05

## 2020-01-07 DIAGNOSIS — K5900 Constipation, unspecified: Secondary | ICD-10-CM | POA: Insufficient documentation

## 2020-01-08 DIAGNOSIS — I5022 Chronic systolic (congestive) heart failure: Secondary | ICD-10-CM | POA: Insufficient documentation

## 2020-01-19 ENCOUNTER — Encounter (HOSPITAL_BASED_OUTPATIENT_CLINIC_OR_DEPARTMENT_OTHER): Payer: Self-pay | Admitting: Emergency Medicine

## 2020-01-19 ENCOUNTER — Other Ambulatory Visit: Payer: Self-pay

## 2020-01-19 ENCOUNTER — Emergency Department (HOSPITAL_BASED_OUTPATIENT_CLINIC_OR_DEPARTMENT_OTHER)
Admission: EM | Admit: 2020-01-19 | Discharge: 2020-01-19 | Disposition: A | Discharge status: Home / Self Care | Payer: Medicare Other | Attending: Emergency Medicine | Admitting: Emergency Medicine

## 2020-01-19 DIAGNOSIS — Z79899 Other long term (current) drug therapy: Secondary | ICD-10-CM | POA: Diagnosis not present

## 2020-01-19 DIAGNOSIS — M79604 Pain in right leg: Secondary | ICD-10-CM | POA: Diagnosis present

## 2020-01-19 DIAGNOSIS — L03115 Cellulitis of right lower limb: Secondary | ICD-10-CM | POA: Diagnosis not present

## 2020-01-19 DIAGNOSIS — E1142 Type 2 diabetes mellitus with diabetic polyneuropathy: Secondary | ICD-10-CM | POA: Insufficient documentation

## 2020-01-19 DIAGNOSIS — Z951 Presence of aortocoronary bypass graft: Secondary | ICD-10-CM | POA: Diagnosis not present

## 2020-01-19 DIAGNOSIS — Z87891 Personal history of nicotine dependence: Secondary | ICD-10-CM | POA: Insufficient documentation

## 2020-01-19 DIAGNOSIS — I25110 Atherosclerotic heart disease of native coronary artery with unstable angina pectoris: Secondary | ICD-10-CM | POA: Insufficient documentation

## 2020-01-19 DIAGNOSIS — L02415 Cutaneous abscess of right lower limb: Secondary | ICD-10-CM | POA: Diagnosis not present

## 2020-01-19 DIAGNOSIS — I10 Essential (primary) hypertension: Secondary | ICD-10-CM | POA: Diagnosis not present

## 2020-01-19 DIAGNOSIS — L02419 Cutaneous abscess of limb, unspecified: Secondary | ICD-10-CM

## 2020-01-19 DIAGNOSIS — H5462 Unqualified visual loss, left eye, normal vision right eye: Secondary | ICD-10-CM | POA: Diagnosis not present

## 2020-01-19 MED ORDER — LIDOCAINE HCL (PF) 1 % IJ SOLN
10.0000 mL | Freq: Once | INTRAMUSCULAR | Status: AC
  Administered 2020-01-19: 10 mL
  Filled 2020-01-19: qty 10

## 2020-01-19 MED ORDER — MORPHINE SULFATE (PF) 4 MG/ML IV SOLN
6.0000 mg | Freq: Once | INTRAVENOUS | Status: AC
  Administered 2020-01-19: 6 mg via INTRAVENOUS
  Filled 2020-01-19: qty 2

## 2020-01-19 MED ORDER — CLINDAMYCIN PHOSPHATE 600 MG/50ML IV SOLN
600.0000 mg | Freq: Once | INTRAVENOUS | Status: AC
  Administered 2020-01-19: 600 mg via INTRAVENOUS
  Filled 2020-01-19: qty 50

## 2020-01-19 MED ORDER — CLINDAMYCIN HCL 300 MG PO CAPS
300.0000 mg | ORAL_CAPSULE | Freq: Four times a day (QID) | ORAL | 0 refills | Status: DC
Start: 2020-01-19 — End: 2020-07-09

## 2020-01-19 NOTE — ED Notes (Signed)
Pt states no chest pain at this time

## 2020-01-19 NOTE — Discharge Instructions (Signed)
Follow-up with your doctor on Monday or if you are unable to return to the emergency room for a wound check.  Change dressing daily.  Return here as needed if you have any worsening symptoms.  Take all the antibiotics as directed.

## 2020-01-19 NOTE — ED Provider Notes (Signed)
[facility] EMERGENCY DEPARTMENT Provider Note   CSN: [id number] Arrival date & time: 01/19/20  1752     History Chief Complaint  Patient presents with  . Leg Pain    [name] is a 74 y.o. male.  Patient is a 74 year old male who presents with right leg pain.  He noticed a knot in his right thigh yesterday and it is progressively gotten bigger today.  He has noticed some redness around his leg.  No known fevers.  No history of similar symptoms in the past.  He has chronic pain and has a fentanyl patch.  He also has chronic anginal symptoms and had some chest pain earlier but denies any currently.  He says its not any different than his typical anginal type symptoms.  He has not had any known injury to his thigh or known spider/insect bites.        Past Medical History:  Diagnosis Date  . Anemia    NOS  . Anxiety   . 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 BPH (benign prostatic hypertrophy)   . Broken foot Oct. 12, 2013   Right foot Fx  . CAD (coronary artery disease)   . Carotid stenosis, left   . Cellulitis and abscess of leg 03/2018   right leg  . Chronic hyponatremia   . Chronic pain syndrome   . COPD (chronic obstructive pulmonary disease) ([place])   . Depression   . Diabetes mellitus   . Diabetic foot ulcer ([place])   . DJD (degenerative joint disease)    L wrist  . DM2 (diabetes mellitus, type 2) ([place])   . GERD (gastroesophageal reflux disease)   . HLD (hyperlipidemia)   . HTN (hypertension)   . Hyponatremia   . Lumbar disc disease   . MI (mitral incompetence)   . Osteoporosis    tx per [facility], Dr Prudencio Burly, thought due to heavy steriod use after 1978  . Peripheral neuropathy   . Peripheral vascular disease ([place])   . Spine fracture    hx, multiple  . Varicose veins   . 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 Vitamin D deficiency     Patient Active Problem List   Diagnosis Date Noted  . Major depressive  disorder, recurrent episode, severe, with psychotic behavior ([place]) 12/29/2019  . Delusional disorder ([place]) 12/29/2019  . Generalized anxiety disorder 07/03/2018  . Restless leg syndrome 07/03/2018  . Paranoia ([place]) 07/03/2018  . PTSD (post-traumatic stress disorder) 05/29/2018  . Abscess of right lower leg   . Severe protein-calorie malnutrition ([place])   . 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 Diabetic polyneuropathy associated with type 2 diabetes mellitus ([place])   . Cellulitis 04/01/2018  . Cellulitis of right lower extremity 04/01/2018  . Tachycardia 04/01/2018  . 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 Unstable angina ([place]) 09/20/2015  . Chest pain, rule out acute myocardial infarction 09/18/2015  . Diabetes mellitus type 2 with peripheral artery disease ([place]) 09/18/2015  . Hyponatremia 09/18/2015  . Chest pain on exertion 09/18/2015  . Varicose veins of bilateral lower extremities with other complications 95/28/4132  . Occlusion and stenosis of carotid artery without mention of cerebral infarction 10/18/2011  . SINUSITIS, ACUTE 03/13/2010  . BACK PAIN 11/13/2009  . FATIGUE 10/17/2009  . Diabetic neuropathy associated with diabetes mellitus due to underlying condition ([place]) 08/29/2009  . Vitamin  D deficiency 08/29/2009  . Hyposmolality and/or hyponatremia 08/29/2009  . VISUAL ACUITY, DECREASED, LEFT EYE 08/29/2009  . PERIPHERAL VASCULAR DISEASE 08/29/2009  . SINUSITIS, CHRONIC 08/29/2009  . NEPHROLITHIASIS 08/29/2009  . FIBROMYALGIA 08/29/2009  . BICEPS TENDON RUPTURE, RIGHT 08/11/2009  . DIABETES MELLITUS, TYPE II 08/05/2009  . Hyperlipidemia 08/05/2009  . ANEMIA-NOS 08/05/2009  . ANXIETY 08/05/2009  . DEPRESSION 08/05/2009  . Chronic pain syndrome 08/05/2009  . Essential hypertension 08/05/2009  . GERD 08/05/2009  . BENIGN PROSTATIC HYPERTROPHY 08/05/2009  . [place] DISEASE, LUMBAR 08/05/2009  . Osteoporosis 08/05/2009    Past Surgical  History:  Procedure Laterality Date  . ANGIOPLASTY    . aorta bifemoral bypass grafting  09/2010  . CARDIAC CATHETERIZATION N/A 09/19/2015   Procedure: Left Heart Cath and Coronary Angiography;  Surgeon: Adrian Prows, MD;  Location: [facility];  Service: Cardiovascular;  Laterality: N/A;  . CARPAL TUNNEL RELEASE     right 2006/ left 2007  . CATARACT EXTRACTION     left 1996/ right 1997  . CORONARY ARTERY BYPASS GRAFT N/A 09/22/2015   Procedure: CORONARY ARTERY BYPASS GRAFTING (CABG) times four using the right greater saphenous vein harvested endoscopically and the left internal mammary artery.  LIMA-LAD, SEQ SVG-DIAG & OM, SVG-PD.;  Surgeon: [name], MD;  Location: [facility];  Service: Open Heart Surgery;  Laterality: N/A;  . I & D EXTREMITY Right 04/07/2018   Procedure: IRRIGATION AND DEBRIDEMENT ABSCESS RIGHT LEG;  Surgeon: Newt Minion, MD;  Location: [facility];  Service: Orthopedics;  Laterality: Right;  . L foot open repair jones fracture  2010    5th metetarsal   . left ankle ganglion cyst  1976  . left carotid endarterectomy  03/2011  . left cataract  1996   right - 1997  . left CTS  2006   R CTS - 2007  . left foot surgery  1998   R surgery/fracture - 1999  . left plantar ganglion cystectomy  1979  . left wrist/hand fusion  2008  . [place], 2004  . NASAL SINUS SURGERY     multiple- x8. last 1997 with obliteration  . REPAIR THORACIC AORTA  2017  . right hand fracture  1969  . ROTATOR CUFF REPAIR  2006   R, than repeat 2011, Dr Theda Sers  . TRIGGER FINGER RELEASE Left 11/11/2014   Procedure: LEFT LONG FINGER RELEASE TRIGGER FINGER/A-1 PULLEY;  Surgeon: Milly Jakob, MD;  Location: [facility];  Service: Orthopedics;  Laterality: Left;  . vocal surgery  1996       Family History  Problem Relation Age of Onset  . Lung cancer Father 65  . Diabetes Mother 54  . Osteoporosis Mother   . Depression Mother   . Bleeding Disorder Mother     . Throat cancer Other        family hx - "bad living" - also lung CA, heart disease and renal failure    . Diabetes Sister   . Suicidality Son 90  . Lung cancer Maternal Grandmother 19  . Diabetes Maternal Grandfather 38  . Heart disease Maternal Grandfather   . Heart attack Paternal Grandmother   . Brain cancer Paternal Grandfather 72    Social History   Tobacco Use  . Smoking status: Former Smoker    Packs/day: 1.50    Years: 30.00    Pack years: 45.00    Types: Cigarettes    Quit date: 11/06/1988  Years since quitting: 31.2  . Smokeless tobacco: Never Used  Substance Use Topics  . Alcohol use: Not Currently    Comment: None in 30 years but drank beer before  . Drug use: No    Home Medications Prior to Admission medications   Medication Sig Start Date End Date Taking? Authorizing Provider  ACCU-CHEK FASTCLIX LANCETS MISC Use as directed to check blood glucose. Dx: E11.51 01/17/18   [name], NP  albuterol (PROAIR HFA) 108 (90 Base) MCG/ACT inhaler Inhale 2 puffs into the lungs every 6 (six) hours as needed for wheezing or shortness of breath.    [provider]  aspirin EC 81 MG tablet Take 81 mg by mouth daily.    [provider]  Blood Glucose Monitoring Suppl (ACCU-CHEK AVIVA PLUS) w/Device KIT Use as directed to check blood glucose daily. Dx: E11.51 01/17/18   [name], NP  BRILINTA 90 MG TABS tablet Take 90 mg by mouth 2 (two) times daily. 11/21/19   [provider]  buPROPion (WELLBUTRIN XL) 150 MG 24 hr tablet Take 300 mg by mouth daily.    [provider]  carvedilol (COREG) 6.25 MG tablet Take 6.25 mg by mouth 2 (two) times daily. 11/21/19   [provider]  celecoxib (CELEBREX) 200 MG capsule TAKE 1 CAPSULE(200 MG) BY MOUTH DAILY Patient taking differently: Take 200 mg by mouth 2 (two) times daily.  10/02/18   [name], NP  Cholecalciferol (VITAMIN D3) 5000 units TABS Take 5,000 Units by mouth  daily.    [provider]  clindamycin (CLEOCIN) 300 MG capsule Take 1 capsule (300 mg total) by mouth 4 (four) times daily. X 7 days 01/19/20   Malvin Johns, MD  clonazePAM (KLONOPIN) 0.5 MG tablet TAKE 1 TABLET(0.5 MG) BY MOUTH TWICE DAILY AS NEEDED FOR ANXIETY Patient taking differently: Take 0.5 mg by mouth 2 (two) times daily as needed for anxiety.  12/25/19   Donnal Moat T, PA-C  desonide (DESOWEN) 0.05 % cream Apply 1 application topically daily as needed (rosacea).     [provider]  diclofenac Sodium (VOLTAREN) 1 % GEL Apply 2 g topically 4 (four) times daily. 11/23/19   Blanchie Dessert, MD  ENTRESTO 49-51 MG Take 1 tablet by mouth 2 (two) times daily. 12/11/19   [provider]  fentaNYL (DURAGESIC) 25 MCG/HR Place 1 patch onto the skin every 3 (three) days. 12/19/19   [provider]  furosemide (LASIX) 20 MG tablet TAKE 1 TABLET BY MOUTH TWICE DAILY Patient taking differently: Take 20 mg by mouth 2 (two) times daily.  07/17/19   Adrian Prows, MD  gabapentin (NEURONTIN) 400 MG capsule 1 q am, 2 qhs Patient taking differently: Take 400-800 mg by mouth See admin instructions. Take 1 capsule (418m) by mouth in the morning and 2 capsules (6026m by mouth at bedtime 08/23/19   HuDonnal Moat, PA-C  glucose blood (ACCU-CHEK AVIVA PLUS) test strip 1 each by Other route as needed for other. Use as instructed. Dx: E11.51 01/17/18   EuLauree ChandlerNP  IRON PO Take 1 capsule by mouth daily.    [provider]  isosorbide mononitrate (IMDUR) 60 MG 24 hr tablet Take 60 mg by mouth daily. 12/11/19   [provider]  JANUVIA 100 MG tablet TAKE 1 TABLET BY MOUTH EVERY DAY Patient taking differently: Take 100 mg by mouth daily.  05/19/18   CaGildardo CrankerDO  lidocaine (LIDODERM) 5 % Place  1 patch onto the skin daily. Remove & Discard patch within 12 hours or as directed by MD 11/23/19   Blanchie Dessert, MD  Multiple Vitamin (MULTIVITAMIN WITH MINERALS)  TABS tablet Take 1 tablet by mouth daily.    [provider]  NARCAN 4 MG/0.1ML LIQD nasal spray kit Place 4 mg into the nose as needed (opioid reversal).  06/18/19   [provider]  nitroGLYCERIN (NITROSTAT) 0.4 MG SL tablet PLACE 1 TABLET UNDER THE TONGUE EVERY 5 MINUTES AS NEEDED FOR CHEST PAIN Patient taking differently: Place 0.4 mg under the tongue every 5 (five) minutes as needed for chest pain.  07/17/19   Adrian Prows, MD  omeprazole (PRILOSEC) 40 MG capsule Take 40 mg by mouth 2 (two) times daily. 11/26/19   [provider]  oxyCODONE-acetaminophen (PERCOCET) 10-325 MG tablet Take 1 tablet by mouth every 12 (twelve) hours as needed for pain. 12/24/19   [provider]  PRALUENT 75 MG/ML SOAJ Inject 75 mg into the skin every 14 (fourteen) days. 10/11/19   [provider]  pravastatin (PRAVACHOL) 20 MG tablet Take 20 mg by mouth at bedtime.    [provider]  rOPINIRole (REQUIP) 1 MG tablet TAKE 2 TABLETS(2 MG) BY MOUTH AT BEDTIME Patient taking differently: Take 1 mg by mouth at bedtime.  11/13/19   Donnal Moat T, PA-C  senna-docusate (SENOKOT-S) 8.6-50 MG tablet Take 2 tablets by mouth 2 (two) times daily.    [provider]  tamsulosin (FLOMAX) 0.4 MG CAPS capsule Take 1 capsule (0.4 mg total) by mouth 2 (two) times daily. Patient taking differently: Take 0.4 mg by mouth daily.  11/16/18   [name], NP    Allergies    Ivp dye [iodinated diagnostic agents], Penicillins, Sulfa antibiotics, Sulfonamide derivatives, Lisinopril, Metoprolol, Statins, and Propoxyphene n-acetaminophen  Review of Systems   Review of Systems  Constitutional: Negative for chills, diaphoresis, fatigue and fever.  HENT: Negative for congestion, rhinorrhea and sneezing.   Eyes: Negative.   Respiratory: Positive for chest tightness (Earlier, none now). Negative for cough and shortness of breath.   Cardiovascular: Negative for chest pain and leg  swelling.  Gastrointestinal: Negative for abdominal pain, blood in stool, diarrhea, nausea and vomiting.  Genitourinary: Negative for difficulty urinating, flank pain, frequency and hematuria.  Musculoskeletal: Negative for arthralgias and back pain.  Skin: Positive for wound. Negative for rash.  Neurological: Negative for dizziness, speech difficulty, weakness, numbness and headaches.    Physical Exam Updated Vital Signs BP 131/82   Pulse 70   Temp 99 F (37.2 C) (Oral)   Resp 19   Ht 6' (1.829 m)   Wt 93 kg   SpO2 96%   BMI 27.80 kg/m   Physical Exam Constitutional:      Appearance: He is well-developed.  HENT:     Head: Normocephalic and atraumatic.  Eyes:     Pupils: Pupils are equal, round, and reactive to light.  Cardiovascular:     Rate and Rhythm: Normal rate and regular rhythm.     Heart sounds: Normal heart sounds.  Pulmonary:     Effort: Pulmonary effort is normal. No respiratory distress.     Breath sounds: Normal breath sounds. No wheezing or rales.  Chest:     Chest wall: No tenderness.  Abdominal:     General: Bowel sounds are normal.     Palpations: Abdomen is soft.     Tenderness: There is no abdominal tenderness. There is no  guarding or rebound.  Musculoskeletal:        General: Normal range of motion.     Cervical back: Normal range of motion and neck supple.     Comments: Patient has a firm cystic type structure/abscess in his right mid thigh.  Approximately 4 cm in diameter.  There are some induration around it and a large area of surrounding erythema.  Lymphadenopathy:     Cervical: No cervical adenopathy.  Skin:    General: Skin is warm and dry.     Findings: No rash.  Neurological:     Mental Status: He is alert and oriented to person, place, and time.     ED Results / Procedures / Treatments   Labs (all labs ordered are listed, but only abnormal results are displayed) Labs Reviewed - No data to display  EKG EKG  Interpretation  Date/Time:  Saturday January 19 2020 19:14:02 EDT Ventricular Rate:  73 PR Interval:  132 QRS Duration: 118 QT Interval:  420 QTC Calculation: 462 R Axis:   28 Text Interpretation: Normal sinus rhythm Premature ventricular complexes Possible Inferior infarct , age undetermined Abnormal ECG since last tracing no significant change Confirmed by Malvin Johns [phone]) on 01/19/2020 10:42:49 PM   Radiology No results found.  Procedures .Marland KitchenIncision and Drainage  Date/Time: 01/19/2020 10:50 PM Performed by: Malvin Johns, MD Authorized by: Malvin Johns, MD   Consent:    Consent obtained:  Verbal   Consent given by:  Patient   Risks discussed:  Bleeding, incomplete drainage and infection   Alternatives discussed:  No treatment Location:    Type:  Abscess   Location:  Lower extremity   Lower extremity location:  Leg   Leg location:  R upper leg Pre-procedure details:    Skin preparation:  Betadine Anesthesia (see MAR for exact dosages):    Anesthesia method:  Local infiltration   Local anesthetic:  Lidocaine 1% w/o epi Procedure type:    Complexity:  Simple Procedure details:    Needle aspiration: no     Incision types:  Elliptical   Incision depth:  Dermal   Scalpel blade:  11   Wound management:  Probed and deloculated   Drainage:  Purulent   Drainage amount:  Moderate   Wound treatment:  Wound left open   Packing materials:  1/4 in iodoform gauze Post-procedure details:    Patient tolerance of procedure:  Tolerated well, no immediate complications   (including critical care time)  Medications Ordered in ED Medications  morphine 4 MG/ML injection 6 mg (6 mg Intravenous Given 01/19/20 2209)  lidocaine (PF) (XYLOCAINE) 1 % injection 10 mL (10 mLs Infiltration Given by Other 01/19/20 2212)  clindamycin (CLEOCIN) IVPB 600 mg (0 mg Intravenous Stopped 01/19/20 2252)    ED Course  I have reviewed the triage vital signs and the nursing notes.  Pertinent labs &  imaging results that were available during my care of the patient were reviewed by me and considered in my medical decision making (see chart for details).  EMERGENCY DEPARTMENT US SOFT TISSUE INTERPRETATION "Study: Limited Soft Tissue Ultrasound"  INDICATIONS: Soft tissue infection Multiple views of the body part were obtained in real-time with a multi-frequency linear probe  PERFORMED BY: Myself IMAGES ARCHIVED?: Yes SIDE:Right  BODY PART:Lower extremity INTERPRETATION:  Abcess present      MDM Rules/Calculators/A&P                      Patient presents with  an abscess in his right thigh.  It is fairly large and had a large amount of surrounding cellulitis.  I was able to open it up actually and got a large amount of purulent drainage.  He was given dose of IV clindamycin.  He was started on oral clindamycin.  He does not appear systemically ill.  I feel he is appropriate for discharge with close follow-up.  I advised him to have a wound check in 2 days either by his PCP or in the ED if he is unable to get into his PCPs office.  Return precautions were given.  Final Clinical Impression(s) / ED Diagnoses Final diagnoses:  Cellulitis and abscess of leg, except foot    Rx / DC Orders ED Discharge Orders         Ordered    clindamycin (CLEOCIN) 300 MG capsule  4 times daily     01/19/20 2248           Malvin Johns, MD 01/19/20 2303

## 2020-01-19 NOTE — ED Notes (Signed)
Patient verbalizes understanding of discharge instructions. Opportunity for questioning and answers were provided. Armband removed by staff, pt discharged from ED in wheelchair.  

## 2020-01-19 NOTE — ED Triage Notes (Signed)
Pt c/o pain and redness to R thigh since yesterday.

## 2020-01-19 NOTE — ED Notes (Signed)
Noticed pt taking a NTG tab while sitting in the lobby. Pt stated he started having CP 10 min prior. Pt taken back to triage room for EKG. Pt states he gets CP regularly and it usually is cleared up after taking 3 doses of NTG. Pt denies ShOB, diaphoresis. EKG provided to EDP and pt remains under nurse monitoring in triage room until exam room becomes available.

## 2020-01-21 ENCOUNTER — Other Ambulatory Visit: Payer: Self-pay

## 2020-01-21 ENCOUNTER — Emergency Department (HOSPITAL_BASED_OUTPATIENT_CLINIC_OR_DEPARTMENT_OTHER)
Admission: EM | Admit: 2020-01-21 | Discharge: 2020-01-21 | Disposition: A | Discharge status: Home / Self Care | Payer: Medicare Other | Attending: Emergency Medicine | Admitting: Emergency Medicine

## 2020-01-21 ENCOUNTER — Encounter (HOSPITAL_BASED_OUTPATIENT_CLINIC_OR_DEPARTMENT_OTHER): Payer: Self-pay

## 2020-01-21 DIAGNOSIS — Z87891 Personal history of nicotine dependence: Secondary | ICD-10-CM | POA: Insufficient documentation

## 2020-01-21 DIAGNOSIS — I2510 Atherosclerotic heart disease of native coronary artery without angina pectoris: Secondary | ICD-10-CM | POA: Insufficient documentation

## 2020-01-21 DIAGNOSIS — B9689 Other specified bacterial agents as the cause of diseases classified elsewhere: Secondary | ICD-10-CM | POA: Diagnosis not present

## 2020-01-21 DIAGNOSIS — E1142 Type 2 diabetes mellitus with diabetic polyneuropathy: Secondary | ICD-10-CM | POA: Diagnosis not present

## 2020-01-21 DIAGNOSIS — J449 Chronic obstructive pulmonary disease, unspecified: Secondary | ICD-10-CM | POA: Insufficient documentation

## 2020-01-21 DIAGNOSIS — Z7982 Long term (current) use of aspirin: Secondary | ICD-10-CM | POA: Insufficient documentation

## 2020-01-21 DIAGNOSIS — Z880 Allergy status to penicillin: Secondary | ICD-10-CM | POA: Diagnosis not present

## 2020-01-21 DIAGNOSIS — L0291 Cutaneous abscess, unspecified: Secondary | ICD-10-CM

## 2020-01-21 DIAGNOSIS — I10 Essential (primary) hypertension: Secondary | ICD-10-CM | POA: Insufficient documentation

## 2020-01-21 DIAGNOSIS — L02415 Cutaneous abscess of right lower limb: Secondary | ICD-10-CM | POA: Diagnosis not present

## 2020-01-21 DIAGNOSIS — Z4800 Encounter for change or removal of nonsurgical wound dressing: Secondary | ICD-10-CM | POA: Diagnosis present

## 2020-01-21 NOTE — ED Triage Notes (Signed)
Pt for f/u of right thigh cellulitis-NAD-steady gait with own cane

## 2020-01-21 NOTE — ED Provider Notes (Signed)
[facility] EMERGENCY DEPARTMENT Provider Note   CSN: [id number] Arrival date & time: 01/21/20  1437     History Chief Complaint  Patient presents with  . Follow-up    [name] is a 74 y.o. male.  HPI Patient here for wound check. Seen 2 days ago for abscess/cellulitis in R thigh, drained and packed then. Reports improved swelling and redness but continues to drain. No fevers at home. Taking Abx without difficulty.     Past Medical History:  Diagnosis Date  . Anemia    NOS  . Anxiety   . 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 BPH (benign prostatic hypertrophy)   . Broken foot Oct. 12, 2013   Right foot Fx  . CAD (coronary artery disease)   . Carotid stenosis, left   . Cellulitis and abscess of leg 03/2018   right leg  . Chronic hyponatremia   . Chronic pain syndrome   . COPD (chronic obstructive pulmonary disease) ([place])   . Depression   . Diabetes mellitus   . Diabetic foot ulcer ([place])   . DJD (degenerative joint disease)    L wrist  . DM2 (diabetes mellitus, type 2) ([place])   . GERD (gastroesophageal reflux disease)   . HLD (hyperlipidemia)   . HTN (hypertension)   . Hyponatremia   . Lumbar disc disease   . MI (mitral incompetence)   . Osteoporosis    tx per [facility], Dr Prudencio Burly, thought due to heavy steriod use after 1978  . Peripheral neuropathy   . Peripheral vascular disease ([place])   . Spine fracture    hx, multiple  . Varicose veins   . 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 Vitamin D deficiency     Patient Active Problem List   Diagnosis Date Noted  . Major depressive disorder, recurrent episode, severe, with psychotic behavior ([place]) 12/29/2019  . Delusional disorder ([place]) 12/29/2019  . Generalized anxiety disorder 07/03/2018  . Restless leg syndrome 07/03/2018  . Paranoia ([place]) 07/03/2018  . PTSD (post-traumatic stress disorder) 05/29/2018  . Abscess of right lower leg   . Severe protein-calorie  malnutrition ([place])   . 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 Diabetic polyneuropathy associated with type 2 diabetes mellitus ([place])   . Cellulitis 04/01/2018  . Cellulitis of right lower extremity 04/01/2018  . Tachycardia 04/01/2018  . 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 Unstable angina ([place]) 09/20/2015  . Chest pain, rule out acute myocardial infarction 09/18/2015  . Diabetes mellitus type 2 with peripheral artery disease ([place]) 09/18/2015  . Hyponatremia 09/18/2015  . Chest pain on exertion 09/18/2015  . Varicose veins of bilateral lower extremities with other complications 54/65/6812  . Occlusion and stenosis of carotid artery without mention of cerebral infarction 10/18/2011  . SINUSITIS, ACUTE 03/13/2010  . BACK PAIN 11/13/2009  . FATIGUE 10/17/2009  . Diabetic neuropathy associated with diabetes mellitus due to underlying condition ([place]) 08/29/2009  . Vitamin D deficiency 08/29/2009  . Hyposmolality and/or hyponatremia 08/29/2009  . VISUAL ACUITY, DECREASED, LEFT EYE 08/29/2009  . PERIPHERAL VASCULAR DISEASE 08/29/2009  . SINUSITIS, CHRONIC 08/29/2009  . NEPHROLITHIASIS 08/29/2009  . FIBROMYALGIA 08/29/2009  . BICEPS TENDON RUPTURE, RIGHT 08/11/2009  . DIABETES MELLITUS, TYPE II 08/05/2009  . Hyperlipidemia 08/05/2009  . ANEMIA-NOS 08/05/2009  . ANXIETY 08/05/2009  . DEPRESSION 08/05/2009  . Chronic pain syndrome 08/05/2009  .  Essential hypertension 08/05/2009  . GERD 08/05/2009  . BENIGN PROSTATIC HYPERTROPHY 08/05/2009  . [place] DISEASE, LUMBAR 08/05/2009  . Osteoporosis 08/05/2009    Past Surgical History:  Procedure Laterality Date  . ANGIOPLASTY    . aorta bifemoral bypass grafting  09/2010  . CARDIAC CATHETERIZATION N/A 09/19/2015   Procedure: Left Heart Cath and Coronary Angiography;  Surgeon: Adrian Prows, MD;  Location: [facility];  Service: Cardiovascular;  Laterality: N/A;  . CARPAL TUNNEL RELEASE     right 2006/  left 2007  . CATARACT EXTRACTION     left 1996/ right 1997  . CORONARY ARTERY BYPASS GRAFT N/A 09/22/2015   Procedure: CORONARY ARTERY BYPASS GRAFTING (CABG) times four using the right greater saphenous vein harvested endoscopically and the left internal mammary artery.  LIMA-LAD, SEQ SVG-DIAG & OM, SVG-PD.;  Surgeon: [name], MD;  Location: [facility];  Service: Open Heart Surgery;  Laterality: N/A;  . I & D EXTREMITY Right 04/07/2018   Procedure: IRRIGATION AND DEBRIDEMENT ABSCESS RIGHT LEG;  Surgeon: Newt Minion, MD;  Location: [facility];  Service: Orthopedics;  Laterality: Right;  . L foot open repair jones fracture  2010    5th metetarsal   . left ankle ganglion cyst  1976  . left carotid endarterectomy  03/2011  . left cataract  1996   right - 1997  . left CTS  2006   R CTS - 2007  . left foot surgery  1998   R surgery/fracture - 1999  . left plantar ganglion cystectomy  1979  . left wrist/hand fusion  2008  . [place], 2004  . NASAL SINUS SURGERY     multiple- x8. last 1997 with obliteration  . REPAIR THORACIC AORTA  2017  . right hand fracture  1969  . ROTATOR CUFF REPAIR  2006   R, than repeat 2011, Dr Theda Sers  . TRIGGER FINGER RELEASE Left 11/11/2014   Procedure: LEFT LONG FINGER RELEASE TRIGGER FINGER/A-1 PULLEY;  Surgeon: Milly Jakob, MD;  Location: [facility];  Service: Orthopedics;  Laterality: Left;  . vocal surgery  1996       Family History  Problem Relation Age of Onset  . Lung cancer Father 59  . Diabetes Mother 81  . Osteoporosis Mother   . Depression Mother   . Bleeding Disorder Mother   . Throat cancer Other        family hx - "bad living" - also lung CA, heart disease and renal failure    . Diabetes Sister   . Suicidality Son 12  . Lung cancer Maternal Grandmother 103  . Diabetes Maternal Grandfather 30  . Heart disease Maternal Grandfather   . Heart attack Paternal Grandmother   . Brain cancer Paternal  Grandfather 28    Social History   Tobacco Use  . Smoking status: Former Smoker    Packs/day: 1.50    Years: 30.00    Pack years: 45.00    Types: Cigarettes    Quit date: 11/06/1988    Years since quitting: 31.2  . Smokeless tobacco: Never Used  Substance Use Topics  . Alcohol use: Not Currently    Comment: None in 30 years but drank beer before  . Drug use: No    Home Medications Prior to Admission medications   Medication Sig Start Date End Date Taking? Authorizing Provider  ACCU-CHEK FASTCLIX LANCETS MISC Use as directed to check blood glucose. Dx: E11.51 01/17/18  [name], NP  albuterol (PROAIR HFA) 108 (90 Base) MCG/ACT inhaler Inhale 2 puffs into the lungs every 6 (six) hours as needed for wheezing or shortness of breath.    [provider]  aspirin EC 81 MG tablet Take 81 mg by mouth daily.    [provider]  Blood Glucose Monitoring Suppl (ACCU-CHEK AVIVA PLUS) w/Device KIT Use as directed to check blood glucose daily. Dx: E11.51 01/17/18   [name], NP  BRILINTA 90 MG TABS tablet Take 90 mg by mouth 2 (two) times daily. 11/21/19   [provider]  buPROPion (WELLBUTRIN XL) 150 MG 24 hr tablet Take 300 mg by mouth daily.    [provider]  carvedilol (COREG) 6.25 MG tablet Take 6.25 mg by mouth 2 (two) times daily. 11/21/19   [provider]  celecoxib (CELEBREX) 200 MG capsule TAKE 1 CAPSULE(200 MG) BY MOUTH DAILY Patient taking differently: Take 200 mg by mouth 2 (two) times daily.  10/02/18   [name], NP  Cholecalciferol (VITAMIN D3) 5000 units TABS Take 5,000 Units by mouth daily.    [provider]  clindamycin (CLEOCIN) 300 MG capsule Take 1 capsule (300 mg total) by mouth 4 (four) times daily. X 7 days 01/19/20   Malvin Johns, MD  clonazePAM (KLONOPIN) 0.5 MG tablet TAKE 1 TABLET(0.5 MG) BY MOUTH TWICE DAILY AS NEEDED FOR ANXIETY Patient taking differently: Take 0.5 mg by mouth 2 (two)  times daily as needed for anxiety.  12/25/19   Donnal Moat T, PA-C  desonide (DESOWEN) 0.05 % cream Apply 1 application topically daily as needed (rosacea).     [provider]  diclofenac Sodium (VOLTAREN) 1 % GEL Apply 2 g topically 4 (four) times daily. 11/23/19   Blanchie Dessert, MD  ENTRESTO 49-51 MG Take 1 tablet by mouth 2 (two) times daily. 12/11/19   [provider]  fentaNYL (DURAGESIC) 25 MCG/HR Place 1 patch onto the skin every 3 (three) days. 12/19/19   [provider]  furosemide (LASIX) 20 MG tablet TAKE 1 TABLET BY MOUTH TWICE DAILY Patient taking differently: Take 20 mg by mouth 2 (two) times daily.  07/17/19   Adrian Prows, MD  gabapentin (NEURONTIN) 400 MG capsule 1 q am, 2 qhs Patient taking differently: Take 400-800 mg by mouth See admin instructions. Take 1 capsule (434m) by mouth in the morning and 2 capsules (6061m by mouth at bedtime 08/23/19   HuDonnal Moat, PA-C  glucose blood (ACCU-CHEK AVIVA PLUS) test strip 1 each by Other route as needed for other. Use as instructed. Dx: E11.51 01/17/18   EuLauree ChandlerNP  IRON PO Take 1 capsule by mouth daily.    [provider]  isosorbide mononitrate (IMDUR) 60 MG 24 hr tablet Take 60 mg by mouth daily. 12/11/19   [provider]  JANUVIA 100 MG tablet TAKE 1 TABLET BY MOUTH EVERY DAY Patient taking differently: Take 100 mg by mouth daily.  05/19/18   CaGildardo CrankerDO  lidocaine (LIDODERM) 5 % Place 1 patch onto the skin daily. Remove & Discard patch within 12 hours or as directed by MD 11/23/19   PlBlanchie DessertMD  Multiple Vitamin (MULTIVITAMIN WITH MINERALS) TABS tablet Take 1 tablet by mouth daily.    [provider]  NARCAN 4 MG/0.1ML LIQD nasal spray kit Place 4 mg into the nose as needed (opioid reversal).  06/18/19   [provider]  nitroGLYCERIN (NITROSTAT) 0.4 MG  SL tablet PLACE 1 TABLET UNDER THE TONGUE EVERY 5 MINUTES AS NEEDED FOR CHEST PAIN Patient  taking differently: Place 0.4 mg under the tongue every 5 (five) minutes as needed for chest pain.  07/17/19   Adrian Prows, MD  omeprazole (PRILOSEC) 40 MG capsule Take 40 mg by mouth 2 (two) times daily. 11/26/19   [provider]  oxyCODONE-acetaminophen (PERCOCET) 10-325 MG tablet Take 1 tablet by mouth every 12 (twelve) hours as needed for pain. 12/24/19   [provider]  PRALUENT 75 MG/ML SOAJ Inject 75 mg into the skin every 14 (fourteen) days. 10/11/19   [provider]  pravastatin (PRAVACHOL) 20 MG tablet Take 20 mg by mouth at bedtime.    [provider]  rOPINIRole (REQUIP) 1 MG tablet TAKE 2 TABLETS(2 MG) BY MOUTH AT BEDTIME Patient taking differently: Take 1 mg by mouth at bedtime.  11/13/19   Donnal Moat T, PA-C  senna-docusate (SENOKOT-S) 8.6-50 MG tablet Take 2 tablets by mouth 2 (two) times daily.    [provider]  tamsulosin (FLOMAX) 0.4 MG CAPS capsule Take 1 capsule (0.4 mg total) by mouth 2 (two) times daily. Patient taking differently: Take 0.4 mg by mouth daily.  11/16/18   [name], NP    Allergies    Ivp dye [iodinated diagnostic agents], Penicillins, Sulfa antibiotics, Sulfonamide derivatives, Lisinopril, Metoprolol, Statins, and Propoxyphene n-acetaminophen  Review of Systems   Review of Systems A comprehensive review of systems was completed and negative except as noted in HPI.   Physical Exam Updated Vital Signs BP 136/62 (BP Location: Left Arm)   Pulse 65   Temp 98.2 F (36.8 C) (Oral)   Resp 16   Ht 6' (1.829 m)   Wt 94.8 kg   SpO2 100%   BMI 28.35 kg/m   Physical Exam Vitals and nursing note reviewed.  HENT:     Head: Normocephalic.     Nose: Nose normal.  Eyes:     Extraocular Movements: Extraocular movements intact.  Pulmonary:     Effort: Pulmonary effort is normal.  Musculoskeletal:        General: Normal range of motion.     Cervical back: Neck supple.  Skin:    Findings: No rash (on  exposed skin).     Comments: I&D site on R thigh with packing in place, continues to drain purulent material, surrounding erythema and induration improved per patient.   Neurological:     Mental Status: He is alert and oriented to person, place, and time.  Psychiatric:        Mood and Affect: Mood normal.     ED Results / Procedures / Treatments   Labs (all labs ordered are listed, but only abnormal results are displayed) Labs Reviewed - No data to display  EKG None  Radiology No results found.  Procedures Procedures (including critical care time)  Medications Ordered in ED Medications - No data to display  ED Course  I have reviewed the triage vital signs and the nursing notes.  Pertinent labs & imaging results that were available during my care of the patient were reviewed by me and considered in my medical decision making (see chart for details).    MDM Rules/Calculators/A&P                      Packing removed and replaced. Dressing reapplied. Patient encouraged to continue Abx, followup with PCP or RTED in 2 days for a  recheck. Sooner for worsening pain or fever.   Final Clinical Impression(s) / ED Diagnoses Final diagnoses:  Abscess    Rx / DC Orders ED Discharge Orders    None       Truddie Hidden, MD 01/21/20 1511

## 2020-01-25 ENCOUNTER — Ambulatory Visit (INDEPENDENT_AMBULATORY_CARE_PROVIDER_SITE_OTHER): Payer: No Typology Code available for payment source | Admitting: Physician Assistant

## 2020-01-25 ENCOUNTER — Encounter: Payer: Self-pay | Admitting: Physician Assistant

## 2020-01-25 ENCOUNTER — Other Ambulatory Visit: Payer: Self-pay

## 2020-01-25 DIAGNOSIS — F22 Delusional disorders: Secondary | ICD-10-CM | POA: Diagnosis not present

## 2020-01-25 DIAGNOSIS — G2581 Restless legs syndrome: Secondary | ICD-10-CM | POA: Diagnosis not present

## 2020-01-25 DIAGNOSIS — F4310 Post-traumatic stress disorder, unspecified: Secondary | ICD-10-CM | POA: Diagnosis not present

## 2020-01-25 DIAGNOSIS — F411 Generalized anxiety disorder: Secondary | ICD-10-CM | POA: Diagnosis not present

## 2020-01-25 DIAGNOSIS — F331 Major depressive disorder, recurrent, moderate: Secondary | ICD-10-CM

## 2020-01-25 MED ORDER — CLONAZEPAM 0.5 MG PO TABS: ORAL_TABLET | ORAL | 0 refills | Status: DC

## 2020-01-25 MED ORDER — BUPROPION HCL ER (XL) 300 MG PO TB24
300.0000 mg | ORAL_TABLET | Freq: Every day | ORAL | 1 refills | Status: DC
Start: 2020-01-25 — End: 2020-07-24

## 2020-01-25 NOTE — Progress Notes (Signed)
[facility] Med Check  Patient ID: [name],  MRN: [id number]  PCP: Doreatha Lew, MD  Date of Evaluation: 01/25/2020 Time spent:40 minutes  Chief Complaint:  Chief Complaint    Medication Refill      HISTORY/CURRENT STATUS: HPI For routine med check.  [name] has had an eventful few months, since his last visit with me in March.  The chronology of the events are difficult to ascertain, he was involuntarily committed at the psych unit [facility] in [place] in May.  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  He tells me that one of the social workers was at his home, after having been there several times before, and this particular time she brought a couple of other people with her.  "I knew something was wrong then.  I was sitting down while they were sitting there talking to me, I was suspicious of why they were there and I told them to leave a couple of times.  I was sitting in a chair that I knew had a gun in between the cushions so I pulled it out and laid it on my lap and you better believe they left then.  I was just waiting for the police to show up and they did.  They took me to the hospital and they kept me in the emergency room for a while, then took me to [facility]."  He was started on Seroquel while in the hospital and was sent home with a prescription, but "I am not taking it.  I am kind of scared of it."  He also has cellulitis of the right lower extremity.  He has been seen in the ER a couple of times in the past 2 weeks and then today saw his usual provider for it.  States he is feeling fine and able to walk on it.  Stays swollen and they have had to drain it a couple of times.  His main complaint is the restless leg syndrome.  He was taken off the Klonopin when he was hospitalized.  But I need it.  My legs jump all day sometimes without it.  I cannot get any sleep because it is so aggravating."  He has  been on Klonopin for years and we have tried in the past 2 to 3 years to get him off of it.  We were unable to get below 1 mg/day.  Morio still knows that the FBI is after him.  He feels that he is being watched all the time.  We have discussed this in the past and he tells me "everybody thinks I am crazy but this is been going on for years."  I have tried to get him on Risperdal but he would not stay on it because after looking it up he found that it is an antipsychotic.  Patient is very knowledgeable and computer savvy for his age.  He is also very social and before COVID hit, he would go to [place] and sit and talk with people.  So he knows what these medications are for and he will not take an antipsychotic.  He is able to enjoy things, in between doctors visits, he said jokingly.  Energy and motivation are fair, with his physical health problems it has been difficult to get around.  He does not cry easily.  Denies suicidal or homicidal thoughts.  Patient denies increased energy with decreased need for sleep, no  increased talkativeness, no racing thoughts, no impulsivity or risky behaviors, no increased spending, no increased libido, no grandiosity, no increased irritability or anger.  Delusions as noted above.  Review of Systems  Constitutional: Positive for malaise/fatigue and weight loss.       When he was in the behavioral health hospital, "they nearly starved me to death.  I think I lost about 20 pounds."  HENT: Negative.   Eyes: Negative.   Respiratory: Negative.   Cardiovascular: Negative.   Gastrointestinal: Negative.   Genitourinary: Negative.   Musculoskeletal: Positive for joint pain and myalgias.  Skin:       See HPI  Neurological: Negative.   Endo/Heme/Allergies: Negative.   Psychiatric/Behavioral: The patient is nervous/anxious.    Individual Medical History/ Review of Systems: Changes? :Yes  See above  Past medications for mental health diagnoses include:Wellbutrin XL,  Risperdal, Prozac, Zoloft, Paxil, Tofranil, gabapentin, ropinirole, Klonopin  Allergies: Ivp dye [iodinated diagnostic agents], Penicillins, Sulfa antibiotics, Sulfonamide derivatives, Lisinopril, Metoprolol, Statins, and Propoxyphene n-acetaminophen  Current Medications:  Current Outpatient Medications:  .  ACCU-CHEK FASTCLIX LANCETS MISC, Use as directed to check blood glucose. Dx: E11.51, Disp: 102 each, Rfl: 6 .  albuterol (PROAIR HFA) 108 (90 Base) MCG/ACT inhaler, Inhale 2 puffs into the lungs every 6 (six) hours as needed for wheezing or shortness of breath., Disp: , Rfl:  .  amiodarone (PACERONE) 200 MG tablet, Take 200 mg by mouth daily., Disp: , Rfl:  .  aspirin EC 81 MG tablet, Take 81 mg by mouth daily., Disp: , Rfl:  .  Blood Glucose Monitoring Suppl (ACCU-CHEK AVIVA PLUS) w/Device KIT, Use as directed to check blood glucose daily. Dx: E11.51, Disp: 1 kit, Rfl: 0 .  BRILINTA 90 MG TABS tablet, Take 90 mg by mouth 2 (two) times daily., Disp: , Rfl:  .  carvedilol (COREG) 6.25 MG tablet, Take 6.25 mg by mouth 2 (two) times daily., Disp: , Rfl:  .  celecoxib (CELEBREX) 200 MG capsule, TAKE 1 CAPSULE(200 MG) BY MOUTH DAILY (Patient taking differently: Take 200 mg by mouth 2 (two) times daily. ), Disp: 30 capsule, Rfl: 0 .  Cholecalciferol (VITAMIN D3) 5000 units TABS, Take 5,000 Units by mouth daily., Disp: , Rfl:  .  clindamycin (CLEOCIN) 300 MG capsule, Take 1 capsule (300 mg total) by mouth 4 (four) times daily. X 7 days, Disp: 28 capsule, Rfl: 0 .  desonide (DESOWEN) 0.05 % cream, Apply 1 application topically daily as needed (rosacea). , Disp: , Rfl:  .  diclofenac Sodium (VOLTAREN) 1 % GEL, Apply 2 g topically 4 (four) times daily., Disp: 100 g, Rfl: 2 .  ENTRESTO 49-51 MG, Take 1 tablet by mouth 2 (two) times daily., Disp: , Rfl:  .  fentaNYL (DURAGESIC) 25 MCG/HR, Place 1 patch onto the skin every 3 (three) days., Disp: , Rfl:  .  furosemide (LASIX) 20 MG tablet, TAKE 1 TABLET BY  MOUTH TWICE DAILY (Patient taking differently: Take 20 mg by mouth 2 (two) times daily. ), Disp: 180 tablet, Rfl: 1 .  gabapentin (NEURONTIN) 400 MG capsule, 1 q am, 2 qhs (Patient taking differently: Take 400-800 mg by mouth See admin instructions. Take 1 capsule (473m) by mouth in the morning and 2 capsules (6034m by mouth at bedtime), Disp: 270 capsule, Rfl: 0 .  glucose blood (ACCU-CHEK AVIVA PLUS) test strip, 1 each by Other route as needed for other. Use as instructed. Dx: E11.51, Disp: 100 each, Rfl: 6 .  IRON PO,  Take 1 capsule by mouth daily., Disp: , Rfl:  .  isosorbide mononitrate (IMDUR) 60 MG 24 hr tablet, Take 60 mg by mouth daily., Disp: , Rfl:  .  JANUVIA 100 MG tablet, TAKE 1 TABLET BY MOUTH EVERY DAY (Patient taking differently: Take 100 mg by mouth daily. ), Disp: 90 tablet, Rfl: 1 .  lidocaine (LIDODERM) 5 %, Place 1 patch onto the skin daily. Remove & Discard patch within 12 hours or as directed by MD, Disp: 30 patch, Rfl: 0 .  Multiple Vitamin (MULTIVITAMIN WITH MINERALS) TABS tablet, Take 1 tablet by mouth daily., Disp: , Rfl:  .  NARCAN 4 MG/0.1ML LIQD nasal spray kit, Place 4 mg into the nose as needed (opioid reversal). , Disp: , Rfl:  .  nitroGLYCERIN (NITROSTAT) 0.4 MG SL tablet, PLACE 1 TABLET UNDER THE TONGUE EVERY 5 MINUTES AS NEEDED FOR CHEST PAIN (Patient taking differently: Place 0.4 mg under the tongue every 5 (five) minutes as needed for chest pain. ), Disp: 25 tablet, Rfl: 2 .  omeprazole (PRILOSEC) 40 MG capsule, Take 40 mg by mouth 2 (two) times daily., Disp: , Rfl:  .  oxyCODONE-acetaminophen (PERCOCET) 10-325 MG tablet, Take 1 tablet by mouth every 12 (twelve) hours as needed for pain., Disp: , Rfl:  .  PRALUENT 75 MG/ML SOAJ, Inject 75 mg into the skin every 14 (fourteen) days., Disp: , Rfl:  .  pravastatin (PRAVACHOL) 20 MG tablet, Take 20 mg by mouth at bedtime., Disp: , Rfl:  .  rOPINIRole (REQUIP) 1 MG tablet, TAKE 2 TABLETS(2 MG) BY MOUTH AT BEDTIME  (Patient taking differently: Take 1 mg by mouth at bedtime. ), Disp: 180 tablet, Rfl: 0 .  senna-docusate (SENOKOT-S) 8.6-50 MG tablet, Take 2 tablets by mouth 2 (two) times daily., Disp: , Rfl:  .  tamsulosin (FLOMAX) 0.4 MG CAPS capsule, Take 1 capsule (0.4 mg total) by mouth 2 (two) times daily. (Patient taking differently: Take 0.4 mg by mouth daily. ), Disp: 180 capsule, Rfl: 0 .  buPROPion (WELLBUTRIN XL) 300 MG 24 hr tablet, Take 1 tablet (300 mg total) by mouth daily., Disp: 90 tablet, Rfl: 1 .  clonazePAM (KLONOPIN) 0.5 MG tablet, TAKE 1 TABLET(0.5 MG) BY MOUTH TWICE DAILY AS NEEDED FOR ANXIETY, Disp: 60 tablet, Rfl: 0 .  traZODone (DESYREL) 100 MG tablet, Take by mouth., Disp: , Rfl:  Medication Side Effects: none  Family Medical/ Social History: Changes? No  MENTAL HEALTH EXAM:  There were no vitals taken for this visit.There is no height or weight on file to calculate BMI.  General Appearance: Casual, Neat and Well Groomed  Eye Contact:  Good  Speech:  Clear and Coherent and Normal Rate  Volume:  Normal  Mood:  Euthymic  Affect:  Appropriate  Thought Process:  Goal Directed and Descriptions of Associations: Intact  Orientation:  Full (Time, Place, and Person)  Thought Content: Logical   Suicidal Thoughts:  No  Homicidal Thoughts:  No  Memory:  WNL  Judgement:  Good  Insight:  Fair  Psychomotor Activity:  slow, walks with a cane  Concentration:  Concentration: Good  Recall:  Good  Fund of Knowledge: Good  Language: Good  Assets:  Desire for Improvement  ADL's:  Intact  Cognition: WNL  Prognosis:  Fair    DIAGNOSES:    ICD-10-CM   1. RLS (restless legs syndrome)  G25.81   2. Paranoia ([place])  F22   3. PTSD (post-traumatic stress disorder)  F43.10  4. Generalized anxiety disorder  F41.1   5. Major depressive disorder, recurrent episode, moderate ([place])  F33.1     Receiving Psychotherapy: No    RECOMMENDATIONS:  PDMP reviewed. Jia and I discussed the need  for an antipsychotic.  He again refuses.  He has been on Klonopin for a very long time.  His pain management provider knows that he takes this, along with his fentanyl and Percocet, and we have all agreed that the benefits outweigh the risks.  Belal knows that taking any 1 of these drugs, much less on 3 at a time, can lead to respiratory depression or death.  He is capable of understanding and he agrees. Continue Wellbutrin XL 300 mg, 1 p.o. daily. Restart Klonopin 0.5 mg, 1 p.o. twice daily as needed.  He really only needs it a few hours before bedtime in order to stop the restlessness. Continue gabapentin 400 mg, 1 p.o. every morning and 2 p.o. nightly. Continue ropinirole 1 mg, 2 p.o. nightly. Continue trazodone 100 mg, 1 p.o. nightly as needed sleep. Return in 4 weeks.  Donnal Moat, PA-C

## 2020-02-22 ENCOUNTER — Encounter: Payer: Self-pay | Admitting: Physician Assistant

## 2020-02-22 ENCOUNTER — Ambulatory Visit (INDEPENDENT_AMBULATORY_CARE_PROVIDER_SITE_OTHER): Payer: No Typology Code available for payment source | Admitting: Physician Assistant

## 2020-02-22 ENCOUNTER — Other Ambulatory Visit: Payer: Self-pay

## 2020-02-22 DIAGNOSIS — F22 Delusional disorders: Secondary | ICD-10-CM | POA: Diagnosis not present

## 2020-02-22 DIAGNOSIS — F3341 Major depressive disorder, recurrent, in partial remission: Secondary | ICD-10-CM | POA: Diagnosis not present

## 2020-02-22 DIAGNOSIS — F411 Generalized anxiety disorder: Secondary | ICD-10-CM

## 2020-02-22 DIAGNOSIS — G2581 Restless legs syndrome: Secondary | ICD-10-CM | POA: Diagnosis not present

## 2020-02-22 MED ORDER — TRAZODONE HCL 100 MG PO TABS: 100.0000 mg | ORAL_TABLET | Freq: Every evening | ORAL | 2 refills | Status: DC | PRN

## 2020-02-22 MED ORDER — CLONAZEPAM 0.5 MG PO TABS: ORAL_TABLET | ORAL | 2 refills | Status: DC

## 2020-02-22 MED ORDER — GABAPENTIN 400 MG PO CAPS: 400.0000 mg | ORAL_CAPSULE | ORAL | 2 refills | Status: DC

## 2020-02-22 NOTE — Progress Notes (Signed)
[facility]  Patient ID: [name],  MRN: [id number]  PCP: Doreatha Lew, MD  Date of Evaluation: 02/22/2020 Time spent:20 minutes  Chief Complaint:  Chief Complaint    Anxiety; Depression; Insomnia      HISTORY/CURRENT STATUS:  As far as his mental health goes, states he's doing well.  Feels like the meds are still working well.  He is able to enjoy things, but still feels like the FBI is watching him.  He has had no other problems with any impulsive or dangerous behaviors.  States that they are going to bother him until he dies.  But he just has to live with it, "I reckon."  Energy and motivation are good, once he is able to get around.  He is still having trouble with his foot and it makes it hard to get around.  The Klonopin still really helps with the restless legs.  No suicidal or homicidal thoughts.  Patient denies increased energy with decreased need for sleep, no increased talkativeness, no racing thoughts, no impulsivity or risky behaviors, no increased spending, no increased libido, no grandiosity, no increased irritability or anger.  Paranoia as noted above.  No hallucinations.   Denies dizziness, syncope, seizures, numbness, tingling, tremor, tics, unsteady gait, slurred speech, confusion. Denies muscle or joint pain, stiffness, or dystonia.  Individual Medical History/ Review of Systems: Changes? :No    Past medications for mental health diagnoses include:Wellbutrin XL, Risperdal, Prozac, Zoloft, Paxil, Tofranil, gabapentin, ropinirole, Klonopin  Allergies: Ivp dye [iodinated diagnostic agents], Penicillins, Sulfa antibiotics, Sulfonamide derivatives, Lisinopril, Metoprolol, Statins, and Propoxyphene n-acetaminophen  Current Medications:  Current Outpatient Medications:  .  ACCU-CHEK FASTCLIX LANCETS MISC, Use as directed to check blood glucose. Dx: E11.51, Disp: 102 each, Rfl: 6 .  albuterol (PROAIR HFA) 108 (90 Base) MCG/ACT inhaler, Inhale 2  puffs into the lungs every 6 (six) hours as needed for wheezing or shortness of breath., Disp: , Rfl:  .  amiodarone (PACERONE) 200 MG tablet, Take 200 mg by mouth daily., Disp: , Rfl:  .  aspirin EC 81 MG tablet, Take 81 mg by mouth daily., Disp: , Rfl:  .  Blood Glucose Monitoring Suppl (ACCU-CHEK AVIVA PLUS) w/Device KIT, Use as directed to check blood glucose daily. Dx: E11.51, Disp: 1 kit, Rfl: 0 .  BRILINTA 90 MG TABS tablet, Take 90 mg by mouth 2 (two) times daily., Disp: , Rfl:  .  buPROPion (WELLBUTRIN XL) 300 MG 24 hr tablet, Take 1 tablet (300 mg total) by mouth daily., Disp: 90 tablet, Rfl: 1 .  celecoxib (CELEBREX) 200 MG capsule, TAKE 1 CAPSULE(200 MG) BY MOUTH DAILY (Patient taking differently: Take 200 mg by mouth 2 (two) times daily. ), Disp: 30 capsule, Rfl: 0 .  Cholecalciferol (VITAMIN D3) 5000 units TABS, Take 5,000 Units by mouth daily., Disp: , Rfl:  .  clonazePAM (KLONOPIN) 0.5 MG tablet, TAKE 1 TABLET(0.5 MG) BY MOUTH TWICE DAILY AS NEEDED FOR ANXIETY, Disp: 60 tablet, Rfl: 2 .  desonide (DESOWEN) 0.05 % cream, Apply 1 application topically daily as needed (rosacea). , Disp: , Rfl:  .  diclofenac Sodium (VOLTAREN) 1 % GEL, Apply 2 g topically 4 (four) times daily., Disp: 100 g, Rfl: 2 .  ENTRESTO 49-51 MG, Take 1 tablet by mouth 2 (two) times daily., Disp: , Rfl:  .  fentaNYL (DURAGESIC) 25 MCG/HR, Place 1 patch onto the skin every 3 (three) days., Disp: , Rfl:  .  furosemide (LASIX) 20 MG tablet,  TAKE 1 TABLET BY MOUTH TWICE DAILY (Patient taking differently: Take 20 mg by mouth 2 (two) times daily. ), Disp: 180 tablet, Rfl: 1 .  gabapentin (NEURONTIN) 400 MG capsule, Take 1-2 capsules (400-800 mg total) by mouth See admin instructions. Take 1 capsule (428m) by mouth in the morning and 2 capsules (60101m by mouth at bedtime, Disp: 90 capsule, Rfl: 2 .  glucose blood (ACCU-CHEK AVIVA PLUS) test strip, 1 each by Other route as needed for other. Use as instructed. Dx: E11.51,  Disp: 100 each, Rfl: 6 .  IRON PO, Take 1 capsule by mouth daily., Disp: , Rfl:  .  isosorbide mononitrate (IMDUR) 60 MG 24 hr tablet, Take 60 mg by mouth daily., Disp: , Rfl:  .  JANUVIA 100 MG tablet, TAKE 1 TABLET BY MOUTH EVERY DAY (Patient taking differently: Take 100 mg by mouth daily. ), Disp: 90 tablet, Rfl: 1 .  Multiple Vitamin (MULTIVITAMIN WITH MINERALS) TABS tablet, Take 1 tablet by mouth daily., Disp: , Rfl:  .  nitroGLYCERIN (NITROSTAT) 0.4 MG SL tablet, PLACE 1 TABLET UNDER THE TONGUE EVERY 5 MINUTES AS NEEDED FOR CHEST PAIN (Patient taking differently: Place 0.4 mg under the tongue every 5 (five) minutes as needed for chest pain. ), Disp: 25 tablet, Rfl: 2 .  omeprazole (PRILOSEC) 40 MG capsule, Take 40 mg by mouth 2 (two) times daily., Disp: , Rfl:  .  oxyCODONE-acetaminophen (PERCOCET) 10-325 MG tablet, Take 1 tablet by mouth every 12 (twelve) hours as needed for pain., Disp: , Rfl:  .  PRALUENT 75 MG/ML SOAJ, Inject 75 mg into the skin every 14 (fourteen) days., Disp: , Rfl:  .  rOPINIRole (REQUIP) 1 MG tablet, TAKE 2 TABLETS(2 MG) BY MOUTH AT BEDTIME (Patient taking differently: Take 1 mg by mouth at bedtime. ), Disp: 180 tablet, Rfl: 0 .  senna-docusate (SENOKOT-S) 8.6-50 MG tablet, Take 2 tablets by mouth 2 (two) times daily., Disp: , Rfl:  .  tamsulosin (FLOMAX) 0.4 MG CAPS capsule, Take 1 capsule (0.4 mg total) by mouth 2 (two) times daily. (Patient taking differently: Take 0.4 mg by mouth daily. ), Disp: 180 capsule, Rfl: 0 .  traZODone (DESYREL) 100 MG tablet, Take 1 tablet (100 mg total) by mouth at bedtime as needed for sleep., Disp: 30 tablet, Rfl: 2 .  carvedilol (COREG) 6.25 MG tablet, Take 6.25 mg by mouth 2 (two) times daily. (Patient not taking: Reported on 02/22/2020), Disp: , Rfl:  .  clindamycin (CLEOCIN) 300 MG capsule, Take 1 capsule (300 mg total) by mouth 4 (four) times daily. X 7 days (Patient not taking: Reported on 02/22/2020), Disp: 28 capsule, Rfl: 0 .   lidocaine (LIDODERM) 5 %, Place 1 patch onto the skin daily. Remove & Discard patch within 12 hours or as directed by MD (Patient not taking: Reported on 02/22/2020), Disp: 30 patch, Rfl: 0 .  NARCAN 4 MG/0.1ML LIQD nasal spray kit, Place 4 mg into the nose as needed (opioid reversal).  (Patient not taking: Reported on 02/22/2020), Disp: , Rfl:  .  pravastatin (PRAVACHOL) 20 MG tablet, Take 20 mg by mouth at bedtime. (Patient not taking: Reported on 02/22/2020), Disp: , Rfl:  Medication Side Effects: none  Family Medical/ Social History: Changes? No  MENTAL HEALTH EXAM:  There were no vitals taken for this visit.There is no height or weight on file to calculate BMI.  General Appearance: Casual, Neat and Well Groomed  Eye Contact:  Good  Speech:  Clear and Coherent  and Normal Rate  Volume:  Normal  Mood:  Euthymic  Affect:  Appropriate  Thought Process:  Goal Directed and Descriptions of Associations: Intact  Orientation:  Full (Time, Place, and Person)  Thought Content: Logical   Suicidal Thoughts:  No  Homicidal Thoughts:  No  Memory:  WNL  Judgement:  Good  Insight:  Fair  Psychomotor Activity:  slow, walks with a cane  Concentration:  Concentration: Good  Recall:  Good  Fund of Knowledge: Good  Language: Good  Assets:  Desire for Improvement  ADL's:  Intact  Cognition: WNL  Prognosis:  Fair    DIAGNOSES:    ICD-10-CM   1. RLS (restless legs syndrome)  G25.81   2. Generalized anxiety disorder  F41.1   3. Paranoia ([place])  F22   4. Recurrent major depressive disorder, in partial remission ([place])  F33.41     Receiving Psychotherapy: No    RECOMMENDATIONS:  PDMP reviewed. I provided 20 minutes of face-to-face time during this encounter. I again recommended an antipsychotic but he refuses to take.   Continue Wellbutrin XL 300 mg, 1 p.o. daily. Continue Klonopin 0.5 mg, 1 p.o. twice daily as needed.  He really only needs it a few hours before bedtime in order to stop the  restlessness. Continue gabapentin 400 mg, 1 p.o. every morning and 2 p.o. nightly. Continue ropinirole 1 mg, 1 p.o. nightly. Continue trazodone 100 mg, 1 p.o. nightly as needed sleep. Return in 4 weeks.  Donnal Moat, PA-C

## 2020-02-27 ENCOUNTER — Telehealth: Payer: Self-pay | Admitting: Physician Assistant

## 2020-02-27 ENCOUNTER — Other Ambulatory Visit: Payer: Self-pay

## 2020-02-27 MED ORDER — GABAPENTIN 400 MG PO CAPS: ORAL_CAPSULE | ORAL | 2 refills | Status: DC

## 2020-02-27 NOTE — Telephone Encounter (Signed)
Patient called and said that the pharmacy will not fill the gabapentin until the 17th of July. We sent in a new script of this med on 7/9. He only has one pill left. Can you call the pharmacy and see what can be done

## 2020-02-27 NOTE — Telephone Encounter (Signed)
Rx sent on 07/09 has 2 different instructions, will update with correct directions and dose increase.

## 2020-03-20 ENCOUNTER — Other Ambulatory Visit: Payer: Self-pay | Admitting: Physician Assistant

## 2020-03-21 NOTE — Telephone Encounter (Signed)
Last visit has requip 1 mg at hs?

## 2020-03-26 ENCOUNTER — Other Ambulatory Visit: Payer: Self-pay

## 2020-03-26 ENCOUNTER — Ambulatory Visit (INDEPENDENT_AMBULATORY_CARE_PROVIDER_SITE_OTHER): Payer: PRIVATE HEALTH INSURANCE | Admitting: Physician Assistant

## 2020-03-26 ENCOUNTER — Encounter: Payer: Self-pay | Admitting: Physician Assistant

## 2020-03-26 DIAGNOSIS — F411 Generalized anxiety disorder: Secondary | ICD-10-CM

## 2020-03-26 DIAGNOSIS — G2581 Restless legs syndrome: Secondary | ICD-10-CM | POA: Diagnosis not present

## 2020-03-26 DIAGNOSIS — F4310 Post-traumatic stress disorder, unspecified: Secondary | ICD-10-CM

## 2020-03-26 DIAGNOSIS — F22 Delusional disorders: Secondary | ICD-10-CM

## 2020-03-26 MED ORDER — GABAPENTIN 400 MG PO CAPS: ORAL_CAPSULE | ORAL | 1 refills | Status: DC

## 2020-03-26 NOTE — Progress Notes (Signed)
[facility]  Patient ID: [name],  MRN: [id number]  PCP: Doreatha Lew, MD  Date of Evaluation: 03/26/2020 Time spent:30 minutes  Chief Complaint:  Chief Complaint    Anxiety; Paranoid      HISTORY/CURRENT STATUS:  Talking about the FBI causing him to leave 2 different apartments, says they're harassing him. "They won't leave me alone. They've told people like a preacher who I know that I am crazy. The FBI is watching me constantly. They keep my new computer 'knocked out someway.' I can't even turn it on sometimes.  I had one of my neighbors look at my computer and when they turned it on and did the same exact thing that I did, it worked for them but it would not work for me.  When I finally do get on, they get on there and watch my every move."  Briefly, he has been thinking the FBI has been watching him for years.  Many years ago, he was awarded disability due to his job in the [employer].  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  At any rate, he feels that [place] and the FBI have been after him all this time because of the money he has received for that disability.  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 The Klonopin still really helps with the restless legs.  He has found that if he takes the gabapentin in the morning, and another at lunch and then 2 at bedtime it is more helpful.  He denies any dizziness or worsening of imbalance due to the medication.  No suicidal or homicidal thoughts.  Patient denies increased energy with decreased need for sleep, no increased talkativeness, no racing thoughts, no impulsivity or risky behaviors, no increased spending, no increased libido, no grandiosity, no increased irritability or anger.  Paranoia as noted above.  No  hallucinations.   [name] does not have any family close by.  I believe he has a son that lives a few hours away, that he does not mention very often.  Patient suffers from PTSD after finding another son who was living with him, had committed suicide by gunshot wound, in another bedroom, and then Avid was the one who found him.  Denies dizziness, syncope, seizures, numbness, tingling, tremor, tics, unsteady gait, slurred speech, confusion. Denies muscle or joint pain, stiffness, or dystonia.  Individual Medical History/ Review of Systems: Changes? :No    Past medications for mental health diagnoses include:Wellbutrin XL, Risperdal, Prozac, Zoloft, Paxil, Tofranil, gabapentin, ropinirole, Klonopin  Allergies: Ivp dye [iodinated diagnostic agents], Penicillins, Sulfa antibiotics, Sulfonamide derivatives, Lisinopril, Metoprolol, Statins, and Propoxyphene n-acetaminophen  Current Medications:  Current Outpatient Medications:  .  ACCU-CHEK FASTCLIX LANCETS MISC, Use as directed to check blood glucose. Dx: E11.51, Disp: 102 each, Rfl: 6 .  albuterol (PROAIR HFA) 108 (90 Base) MCG/ACT inhaler, Inhale 2 puffs into the lungs every 6 (six) hours as needed for wheezing or shortness of breath., Disp: , Rfl:  .  amiodarone (PACERONE) 200 MG tablet, Take 200 mg by mouth daily., Disp: , Rfl:  .  aspirin EC 81 MG tablet, Take 81 mg by mouth daily., Disp: , Rfl:  .  Blood Glucose Monitoring Suppl (ACCU-CHEK AVIVA PLUS) w/Device KIT, Use as directed  to check blood glucose daily. Dx: E11.51, Disp: 1 kit, Rfl: 0 .  BRILINTA 90 MG TABS tablet, Take 90 mg by mouth 2 (two) times daily., Disp: , Rfl:  .  buPROPion (WELLBUTRIN XL) 300 MG 24 hr tablet, Take 1 tablet (300 mg total) by mouth daily., Disp: 90 tablet, Rfl: 1 .  celecoxib (CELEBREX) 200 MG capsule, TAKE 1 CAPSULE(200 MG) BY MOUTH DAILY (Patient taking differently: Take 200 mg by mouth 2 (two) times daily. ), Disp: 30 capsule, Rfl: 0 .  Cholecalciferol  (VITAMIN D3) 5000 units TABS, Take 5,000 Units by mouth daily., Disp: , Rfl:  .  clonazePAM (KLONOPIN) 0.5 MG tablet, TAKE 1 TABLET(0.5 MG) BY MOUTH TWICE DAILY AS NEEDED FOR ANXIETY, Disp: 60 tablet, Rfl: 2 .  desonide (DESOWEN) 0.05 % cream, Apply 1 application topically daily as needed (rosacea). , Disp: , Rfl:  .  diclofenac Sodium (VOLTAREN) 1 % GEL, Apply 2 g topically 4 (four) times daily., Disp: 100 g, Rfl: 2 .  ENTRESTO 49-51 MG, Take 1 tablet by mouth 2 (two) times daily., Disp: , Rfl:  .  fentaNYL (DURAGESIC) 25 MCG/HR, Place 1 patch onto the skin every 3 (three) days., Disp: , Rfl:  .  furosemide (LASIX) 20 MG tablet, TAKE 1 TABLET BY MOUTH TWICE DAILY (Patient taking differently: Take 20 mg by mouth 2 (two) times daily. ), Disp: 180 tablet, Rfl: 1 .  gabapentin (NEURONTIN) 400 MG capsule, 1 po q am, 1 po at lunch, 2 po qhs., Disp: 360 capsule, Rfl: 1 .  glucose blood (ACCU-CHEK AVIVA PLUS) test strip, 1 each by Other route as needed for other. Use as instructed. Dx: E11.51, Disp: 100 each, Rfl: 6 .  IRON PO, Take 1 capsule by mouth daily., Disp: , Rfl:  .  isosorbide mononitrate (IMDUR) 60 MG 24 hr tablet, Take 60 mg by mouth daily., Disp: , Rfl:  .  JANUVIA 100 MG tablet, TAKE 1 TABLET BY MOUTH EVERY DAY (Patient taking differently: Take 100 mg by mouth daily. ), Disp: 90 tablet, Rfl: 1 .  Multiple Vitamin (MULTIVITAMIN WITH MINERALS) TABS tablet, Take 1 tablet by mouth daily., Disp: , Rfl:  .  NARCAN 4 MG/0.1ML LIQD nasal spray kit, Place 4 mg into the nose as needed (opioid reversal). , Disp: , Rfl:  .  nitroGLYCERIN (NITROSTAT) 0.4 MG SL tablet, PLACE 1 TABLET UNDER THE TONGUE EVERY 5 MINUTES AS NEEDED FOR CHEST PAIN (Patient taking differently: Place 0.4 mg under the tongue every 5 (five) minutes as needed for chest pain. ), Disp: 25 tablet, Rfl: 2 .  omeprazole (PRILOSEC) 40 MG capsule, Take 40 mg by mouth 2 (two) times daily., Disp: , Rfl:  .  oxyCODONE-acetaminophen (PERCOCET)  10-325 MG tablet, Take 1 tablet by mouth every 12 (twelve) hours as needed for pain., Disp: , Rfl:  .  rOPINIRole (REQUIP) 1 MG tablet, TAKE 2 TABLETS(2 MG) BY MOUTH AT BEDTIME, Disp: 180 tablet, Rfl: 0 .  senna-docusate (SENOKOT-S) 8.6-50 MG tablet, Take 2 tablets by mouth 2 (two) times daily., Disp: , Rfl:  .  tamsulosin (FLOMAX) 0.4 MG CAPS capsule, Take 1 capsule (0.4 mg total) by mouth 2 (two) times daily. (Patient taking differently: Take 0.4 mg by mouth daily. ), Disp: 180 capsule, Rfl: 0 .  carvedilol (COREG) 6.25 MG tablet, Take 6.25 mg by mouth 2 (two) times daily. (Patient not taking: Reported on 02/22/2020), Disp: , Rfl:  .  clindamycin (CLEOCIN) 300 MG capsule, Take 1 capsule (  300 mg total) by mouth 4 (four) times daily. X 7 days (Patient not taking: Reported on 02/22/2020), Disp: 28 capsule, Rfl: 0 .  lidocaine (LIDODERM) 5 %, Place 1 patch onto the skin daily. Remove & Discard patch within 12 hours or as directed by MD (Patient not taking: Reported on 02/22/2020), Disp: 30 patch, Rfl: 0 .  PRALUENT 75 MG/ML SOAJ, Inject 75 mg into the skin every 14 (fourteen) days. (Patient not taking: Reported on 03/26/2020), Disp: , Rfl:  .  pravastatin (PRAVACHOL) 20 MG tablet, Take 20 mg by mouth at bedtime. (Patient not taking: Reported on 02/22/2020), Disp: , Rfl:  Medication Side Effects: none  Family Medical/ Social History: Changes? No  MENTAL HEALTH EXAM:  There were no vitals taken for this visit.There is no height or weight on file to calculate BMI.  General Appearance: Casual, Neat and Well Groomed  Eye Contact:  Good  Speech:  Clear and Coherent and Normal Rate  Volume:  Normal  Mood:  Euthymic  Affect:  Appropriate  Thought Process:  Goal Directed and Descriptions of Associations: Intact  Orientation:  Full (Time, Place, and Person)  Thought Content: Paranoid Ideation as above  Suicidal Thoughts:  No  Homicidal Thoughts:  No  Memory:  WNL  Judgement:  Good  Insight:  Fair   Psychomotor Activity:  slow, walks with a cane  Concentration:  Concentration: Good  Recall:  Good  Fund of Knowledge: Good  Language: Good  Assets:  Desire for Improvement  ADL's:  Intact  Cognition: WNL  Prognosis:  Fair    DIAGNOSES:    ICD-10-CM   1. Paranoia ([place])  F22   2. RLS (restless legs syndrome)  G25.81   3. PTSD (post-traumatic stress disorder)  F43.10   4. Generalized anxiety disorder  F41.1     Receiving Psychotherapy: No    RECOMMENDATIONS:  PDMP reviewed. I provided 30 minutes of face to face time during this encounter.  I strongly recommend an antipsychotic but he refuses to take. 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  At one point, I prescribed Risperdal for his 'anxiety' but he came back and told me he didn't need an antipsychotic. Today, I suggested that he take one, just to let other people who thinks he's 'crazy' (his words) know that he's on a medication to help him.  He still refuses. As far as the Gabapentin goes, I agree to increase it. He is on several meds that can cause problems in the elderly, but I think he genuinely needs them and has no contraindications except for age.  Continue Wellbutrin XL 300 mg, 1 p.o. daily. Continue Klonopin 0.5 mg, 1 p.o. twice daily as needed.  He really only needs it a few hours before bedtime in order to stop the restlessness. Increase gabapentin 400 mg, 1 p.o. every morning, add 1 around lunch, and 2 p.o. nightly. Continue ropinirole 1 mg, 1 p.o. nightly. D/C the Trazodone b/c he's not needing it plus it can interact with Amiodarone. He understands.  Return in 6 weeks.  Donnal Moat, PA-C

## 2020-05-02 ENCOUNTER — Emergency Department (HOSPITAL_COMMUNITY)
Admission: EM | Admit: 2020-05-02 | Discharge: 2020-05-02 | Disposition: A | Discharge status: Home / Self Care | Payer: Medicare Other | Attending: Emergency Medicine | Admitting: Emergency Medicine

## 2020-05-02 ENCOUNTER — Emergency Department (HOSPITAL_COMMUNITY): Payer: Medicare Other

## 2020-05-02 DIAGNOSIS — M25512 Pain in left shoulder: Secondary | ICD-10-CM | POA: Insufficient documentation

## 2020-05-02 DIAGNOSIS — I509 Heart failure, unspecified: Secondary | ICD-10-CM

## 2020-05-02 DIAGNOSIS — J449 Chronic obstructive pulmonary disease, unspecified: Secondary | ICD-10-CM | POA: Insufficient documentation

## 2020-05-02 DIAGNOSIS — I2510 Atherosclerotic heart disease of native coronary artery without angina pectoris: Secondary | ICD-10-CM | POA: Diagnosis not present

## 2020-05-02 DIAGNOSIS — R0602 Shortness of breath: Secondary | ICD-10-CM | POA: Diagnosis not present

## 2020-05-02 DIAGNOSIS — Z7982 Long term (current) use of aspirin: Secondary | ICD-10-CM | POA: Insufficient documentation

## 2020-05-02 DIAGNOSIS — L02511 Cutaneous abscess of right hand: Secondary | ICD-10-CM

## 2020-05-02 DIAGNOSIS — Z87891 Personal history of nicotine dependence: Secondary | ICD-10-CM | POA: Diagnosis not present

## 2020-05-02 DIAGNOSIS — E871 Hypo-osmolality and hyponatremia: Secondary | ICD-10-CM

## 2020-05-02 DIAGNOSIS — E119 Type 2 diabetes mellitus without complications: Secondary | ICD-10-CM | POA: Diagnosis not present

## 2020-05-02 DIAGNOSIS — Z79899 Other long term (current) drug therapy: Secondary | ICD-10-CM | POA: Diagnosis not present

## 2020-05-02 DIAGNOSIS — R2231 Localized swelling, mass and lump, right upper limb: Secondary | ICD-10-CM | POA: Diagnosis present

## 2020-05-02 DIAGNOSIS — L03011 Cellulitis of right finger: Secondary | ICD-10-CM | POA: Diagnosis not present

## 2020-05-02 DIAGNOSIS — I10 Essential (primary) hypertension: Secondary | ICD-10-CM | POA: Insufficient documentation

## 2020-05-02 DIAGNOSIS — Z20822 Contact with and (suspected) exposure to covid-19: Secondary | ICD-10-CM | POA: Insufficient documentation

## 2020-05-02 DIAGNOSIS — Z951 Presence of aortocoronary bypass graft: Secondary | ICD-10-CM | POA: Diagnosis not present

## 2020-05-02 LAB — URINALYSIS, ROUTINE W REFLEX MICROSCOPIC
Bacteria, UA: NONE SEEN
Bilirubin Urine: NEGATIVE
Glucose, UA: NEGATIVE mg/dL
Ketones, ur: NEGATIVE mg/dL
Leukocytes,Ua: NEGATIVE
Nitrite: NEGATIVE
Protein, ur: 30 mg/dL — AB
Specific Gravity, Urine: 1.014 (ref 1.005–1.030)
pH: 5 (ref 5.0–8.0)

## 2020-05-02 LAB — CBC WITH DIFFERENTIAL/PLATELET
Abs Immature Granulocytes: 0.14 10*3/uL — ABNORMAL HIGH (ref 0.00–0.07)
Basophils Absolute: 0 10*3/uL (ref 0.0–0.1)
Basophils Relative: 0 %
Eosinophils Absolute: 0.2 10*3/uL (ref 0.0–0.5)
Eosinophils Relative: 2 %
HCT: 36.5 % — ABNORMAL LOW (ref 39.0–52.0)
Hemoglobin: 11.9 g/dL — ABNORMAL LOW (ref 13.0–17.0)
Immature Granulocytes: 2 %
Lymphocytes Relative: 5 %
Lymphs Abs: 0.5 10*3/uL — ABNORMAL LOW (ref 0.7–4.0)
MCH: 30.3 pg (ref 26.0–34.0)
MCHC: 32.6 g/dL (ref 30.0–36.0)
MCV: 92.9 fL (ref 80.0–100.0)
Monocytes Absolute: 1.1 10*3/uL — ABNORMAL HIGH (ref 0.1–1.0)
Monocytes Relative: 13 %
Neutro Abs: 6.8 10*3/uL (ref 1.7–7.7)
Neutrophils Relative %: 78 %
Platelets: 259 10*3/uL (ref 150–400)
RBC: 3.93 MIL/uL — ABNORMAL LOW (ref 4.22–5.81)
RDW: 14.6 % (ref 11.5–15.5)
WBC: 8.7 10*3/uL (ref 4.0–10.5)
nRBC: 0 % (ref 0.0–0.2)

## 2020-05-02 LAB — LACTIC ACID, PLASMA: Lactic Acid, Venous: 1.4 mmol/L (ref 0.5–1.9)

## 2020-05-02 LAB — COMPREHENSIVE METABOLIC PANEL
ALT: 56 U/L — ABNORMAL HIGH (ref 0–44)
AST: 102 U/L — ABNORMAL HIGH (ref 15–41)
Albumin: 2.9 g/dL — ABNORMAL LOW (ref 3.5–5.0)
Alkaline Phosphatase: 173 U/L — ABNORMAL HIGH (ref 38–126)
Anion gap: 12 (ref 5–15)
BUN: 28 mg/dL — ABNORMAL HIGH (ref 8–23)
CO2: 21 mmol/L — ABNORMAL LOW (ref 22–32)
Calcium: 8.3 mg/dL — ABNORMAL LOW (ref 8.9–10.3)
Chloride: 95 mmol/L — ABNORMAL LOW (ref 98–111)
Creatinine, Ser: 1.21 mg/dL (ref 0.61–1.24)
GFR calc Af Amer: >60 mL/min (ref >60)
GFR calc non Af Amer: 59 mL/min — ABNORMAL LOW (ref >60)
Glucose, Bld: 227 mg/dL — ABNORMAL HIGH (ref 70–99)
Potassium: 4.6 mmol/L (ref 3.5–5.1)
Sodium: 128 mmol/L — ABNORMAL LOW (ref 135–145)
Total Bilirubin: 0.8 mg/dL (ref 0.3–1.2)
Total Protein: 7.3 g/dL (ref 6.5–8.1)

## 2020-05-02 LAB — PROTIME-INR
INR: 1.1 (ref 0.8–1.2)
Prothrombin Time: 14.2 seconds (ref 11.4–15.2)

## 2020-05-02 LAB — CBG MONITORING, ED: Glucose-Capillary: 245 mg/dL — ABNORMAL HIGH (ref 70–99)

## 2020-05-02 LAB — APTT: aPTT: 39 seconds — ABNORMAL HIGH (ref 24–36)

## 2020-05-02 LAB — BRAIN NATRIURETIC PEPTIDE: B Natriuretic Peptide: 626.2 pg/mL — ABNORMAL HIGH (ref 0.0–100.0)

## 2020-05-02 LAB — SARS CORONAVIRUS 2 BY RT PCR (HOSPITAL ORDER, PERFORMED IN ~~LOC~~ HOSPITAL LAB): SARS Coronavirus 2: NEGATIVE

## 2020-05-02 MED ORDER — FUROSEMIDE 10 MG/ML IJ SOLN
40.0000 mg | Freq: Once | INTRAMUSCULAR | Status: AC
  Administered 2020-05-02: 40 mg via INTRAVENOUS
  Filled 2020-05-02: qty 4

## 2020-05-02 MED ORDER — CEPHALEXIN 250 MG PO CAPS
500.0000 mg | ORAL_CAPSULE | Freq: Once | ORAL | Status: AC
  Administered 2020-05-02: 500 mg via ORAL
  Filled 2020-05-02: qty 2

## 2020-05-02 MED ORDER — BUPIVACAINE HCL (PF) 0.5 % IJ SOLN
10.0000 mL | Freq: Once | INTRAMUSCULAR | Status: AC
  Administered 2020-05-02: 10 mL
  Filled 2020-05-02: qty 10

## 2020-05-02 NOTE — ED Notes (Signed)
The pt is insidentally wearing a fentanyl patch on his rt thigh  He reports that it is a low dose

## 2020-05-02 NOTE — ED Triage Notes (Signed)
Emergency Medicine Provider Triage Evaluation Note  [name] , a 74 y.o. male  was evaluated in triage.  Pt complains of shortness of breath, leg swelling.  Review of Systems  Positive: Finger infection, shortness of breath, leg swelling Negative: Chest pain  Physical Exam  There were no vitals taken for this visit. Gen:   Appears ill and dehydrated. Warm to touch HEENT:  Atraumatic. MM dry Resp:  Normal effort. Short sentences Cardiac:  Mildly tachy  Abd:   Nondistended, nontender  MSK:   Moves extremities without difficulty Neuro:  Speech clear   Medical Decision Making  Medically screening exam initiated at 2:59 PM.  Appropriate orders placed.  [name] was informed that the remainder of the evaluation will be completed by another provider, this initial triage assessment does not replace that evaluation, and the importance of remaining in the ED until their evaluation is complete.  Clinical Impression   Patient appears ill.  Began to touch.  Mildly tachycardic.  Initial blood pressures in the 70s.  Concern for sepsis, undifferentiated cause.  Although patient does have a finger infection, also consider pulmonary cause due to shortness of breath.  Undifferentiated presentation for sepsis orders placed.  Charge nurse made aware patient will need to be roomed ASAP.   Franchot Heidelberg, PA-C 05/02/20 1501

## 2020-05-02 NOTE — ED Notes (Signed)
Iv infiltrated had to start another iv

## 2020-05-02 NOTE — ED Provider Notes (Signed)
[facility] EMERGENCY DEPARTMENT Provider Note   CSN: [id number] Arrival date & time: 05/02/20  1422     History Chief Complaint  Patient presents with  . Leg Swelling    [name] is a 74 y.o. male.  Pt presents to the ED today with leg swelling, sob, and right finger swelling.  Pt said he saw his cardiologist and they told him to come here.  According to Epic, he was seen by [facility] on 9/14 and BNP was over 5000 and Na was 123.  They recommended going to the closest hospital for diuresis.  Pt said he was not aware of this recommendation.  There was a report of low bp, but bp now is nl.          Past Medical History:  Diagnosis Date  . Anemia    NOS  . Anxiety   . 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 BPH (benign prostatic hypertrophy)   . Broken foot Oct. 12, 2013   Right foot Fx  . CAD (coronary artery disease)   . Carotid stenosis, left   . Cellulitis and abscess of leg 03/2018   right leg  . Chronic hyponatremia   . Chronic pain syndrome   . COPD (chronic obstructive pulmonary disease) ([place])   . Depression   . Diabetes mellitus   . Diabetic foot ulcer ([place])   . DJD (degenerative joint disease)    L wrist  . DM2 (diabetes mellitus, type 2) ([place])   . GERD (gastroesophageal reflux disease)   . HLD (hyperlipidemia)   . HTN (hypertension)   . Hyponatremia   . Lumbar disc disease   . MI (mitral incompetence)   . Osteoporosis    tx per [facility], Dr Prudencio Burly, thought due to heavy steriod use after 1978  . Peripheral neuropathy   . Peripheral vascular disease ([place])   . Spine fracture    hx, multiple  . Varicose veins   . 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 Vitamin D deficiency     Patient Active Problem List   Diagnosis Date Noted  . Major depressive disorder, recurrent episode, severe, with psychotic behavior ([place]) 12/29/2019  . Delusional disorder ([place]) 12/29/2019  . Generalized anxiety  disorder 07/03/2018  . Restless leg syndrome 07/03/2018  . Paranoia ([place]) 07/03/2018  . PTSD (post-traumatic stress disorder) 05/29/2018  . Abscess of right lower leg   . Severe protein-calorie malnutrition ([place])   . 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 Diabetic polyneuropathy associated with type 2 diabetes mellitus ([place])   . Cellulitis 04/01/2018  . Cellulitis of right lower extremity 04/01/2018  . Tachycardia 04/01/2018  . 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 Unstable angina ([place]) 09/20/2015  . Chest pain, rule out acute myocardial infarction 09/18/2015  . Diabetes mellitus type 2 with peripheral artery disease ([place]) 09/18/2015  . Hyponatremia 09/18/2015  . Chest pain on exertion 09/18/2015  . Varicose veins of bilateral lower extremities with other complications 93/90/3009  . Occlusion and stenosis of carotid artery without mention of cerebral infarction 10/18/2011  . SINUSITIS, ACUTE 03/13/2010  . BACK PAIN 11/13/2009  . FATIGUE 10/17/2009  . Diabetic neuropathy associated with diabetes mellitus due to underlying condition ([place]) 08/29/2009  . Vitamin D deficiency 08/29/2009  . Hyposmolality and/or hyponatremia 08/29/2009  . VISUAL ACUITY, DECREASED, LEFT EYE 08/29/2009  . PERIPHERAL  VASCULAR DISEASE 08/29/2009  . SINUSITIS, CHRONIC 08/29/2009  . NEPHROLITHIASIS 08/29/2009  . FIBROMYALGIA 08/29/2009  . BICEPS TENDON RUPTURE, RIGHT 08/11/2009  . DIABETES MELLITUS, TYPE II 08/05/2009  . Hyperlipidemia 08/05/2009  . ANEMIA-NOS 08/05/2009  . ANXIETY 08/05/2009  . DEPRESSION 08/05/2009  . Chronic pain syndrome 08/05/2009  . Essential hypertension 08/05/2009  . GERD 08/05/2009  . BENIGN PROSTATIC HYPERTROPHY 08/05/2009  . [place] DISEASE, LUMBAR 08/05/2009  . Osteoporosis 08/05/2009    Past Surgical History:  Procedure Laterality Date  . ANGIOPLASTY    . aorta bifemoral bypass grafting  09/2010  . CARDIAC CATHETERIZATION N/A 09/19/2015    Procedure: Left Heart Cath and Coronary Angiography;  Surgeon: Adrian Prows, MD;  Location: [facility];  Service: Cardiovascular;  Laterality: N/A;  . CARPAL TUNNEL RELEASE     right 2006/ left 2007  . CATARACT EXTRACTION     left 1996/ right 1997  . CORONARY ARTERY BYPASS GRAFT N/A 09/22/2015   Procedure: CORONARY ARTERY BYPASS GRAFTING (CABG) times four using the right greater saphenous vein harvested endoscopically and the left internal mammary artery.  LIMA-LAD, SEQ SVG-DIAG & OM, SVG-PD.;  Surgeon: [name], MD;  Location: [facility];  Service: Open Heart Surgery;  Laterality: N/A;  . I & D EXTREMITY Right 04/07/2018   Procedure: IRRIGATION AND DEBRIDEMENT ABSCESS RIGHT LEG;  Surgeon: Newt Minion, MD;  Location: [facility];  Service: Orthopedics;  Laterality: Right;  . L foot open repair jones fracture  2010    5th metetarsal   . left ankle ganglion cyst  1976  . left carotid endarterectomy  03/2011  . left cataract  1996   right - 1997  . left CTS  2006   R CTS - 2007  . left foot surgery  1998   R surgery/fracture - 1999  . left plantar ganglion cystectomy  1979  . left wrist/hand fusion  2008  . [place], 2004  . NASAL SINUS SURGERY     multiple- x8. last 1997 with obliteration  . REPAIR THORACIC AORTA  2017  . right hand fracture  1969  . ROTATOR CUFF REPAIR  2006   R, than repeat 2011, Dr Theda Sers  . TRIGGER FINGER RELEASE Left 11/11/2014   Procedure: LEFT LONG FINGER RELEASE TRIGGER FINGER/A-1 PULLEY;  Surgeon: Milly Jakob, MD;  Location: [facility];  Service: Orthopedics;  Laterality: Left;  . vocal surgery  1996       Family History  Problem Relation Age of Onset  . Lung cancer Father 88  . Diabetes Mother 3  . Osteoporosis Mother   . Depression Mother   . Bleeding Disorder Mother   . Throat cancer Other        family hx - "bad living" - also lung CA, heart disease and renal failure    . Diabetes Sister   . Suicidality  Son 21  . Lung cancer Maternal Grandmother 95  . Diabetes Maternal Grandfather 46  . Heart disease Maternal Grandfather   . Heart attack Paternal Grandmother   . Brain cancer Paternal Grandfather 49    Social History   Tobacco Use  . Smoking status: Former Smoker    Packs/day: 1.50    Years: 30.00    Pack years: 45.00    Types: Cigarettes    Quit date: 11/06/1988    Years since quitting: 31.5  . Smokeless tobacco: Never Used  Vaping Use  . Vaping Use: Never used  Substance Use Topics  . Alcohol use: Not Currently    Comment: None in 30 years but drank beer before  . Drug use: No    Home Medications Prior to Admission medications   Medication Sig Start Date End Date Taking? Authorizing Provider  ACCU-CHEK FASTCLIX LANCETS MISC Use as directed to check blood glucose. Dx: E11.51 01/17/18   [name], NP  albuterol (PROAIR HFA) 108 (90 Base) MCG/ACT inhaler Inhale 2 puffs into the lungs every 6 (six) hours as needed for wheezing or shortness of breath.    [provider]  amiodarone (PACERONE) 200 MG tablet Take 200 mg by mouth daily. 01/11/20   [provider]  aspirin EC 81 MG tablet Take 81 mg by mouth daily.    [provider]  Blood Glucose Monitoring Suppl (ACCU-CHEK AVIVA PLUS) w/Device KIT Use as directed to check blood glucose daily. Dx: E11.51 01/17/18   [name], NP  BRILINTA 90 MG TABS tablet Take 90 mg by mouth 2 (two) times daily. 11/21/19   [provider]  buPROPion (WELLBUTRIN XL) 300 MG 24 hr tablet Take 1 tablet (300 mg total) by mouth daily. 01/25/20   Donnal Moat T, PA-C  carvedilol (COREG) 6.25 MG tablet Take 6.25 mg by mouth 2 (two) times daily. Patient not taking: Reported on 02/22/2020 11/21/19   [provider]  celecoxib (CELEBREX) 200 MG capsule TAKE 1 CAPSULE(200 MG) BY MOUTH DAILY Patient taking differently: Take 200 mg by mouth 2 (two) times daily.  10/02/18   [name], NP    Cholecalciferol (VITAMIN D3) 5000 units TABS Take 5,000 Units by mouth daily.    [provider]  clindamycin (CLEOCIN) 300 MG capsule Take 1 capsule (300 mg total) by mouth 4 (four) times daily. X 7 days Patient not taking: Reported on 02/22/2020 01/19/20   Malvin Johns, MD  clonazePAM (KLONOPIN) 0.5 MG tablet TAKE 1 TABLET(0.5 MG) BY MOUTH TWICE DAILY AS NEEDED FOR ANXIETY 02/22/20   Hurst, Helene Kelp T, PA-C  desonide (DESOWEN) 0.05 % cream Apply 1 application topically daily as needed (rosacea).     [provider]  diclofenac Sodium (VOLTAREN) 1 % GEL Apply 2 g topically 4 (four) times daily. 11/23/19   Blanchie Dessert, MD  ENTRESTO 49-51 MG Take 1 tablet by mouth 2 (two) times daily. 12/11/19   [provider]  fentaNYL (DURAGESIC) 25 MCG/HR Place 1 patch onto the skin every 3 (three) days. 12/19/19   [provider]  furosemide (LASIX) 20 MG tablet TAKE 1 TABLET BY MOUTH TWICE DAILY Patient taking differently: Take 20 mg by mouth 2 (two) times daily.  07/17/19   Adrian Prows, MD  gabapentin (NEURONTIN) 400 MG capsule 1 po q am, 1 po at lunch, 2 po qhs. 03/26/20   Hurst, Teresa T, PA-C  glucose blood (ACCU-CHEK AVIVA PLUS) test strip 1 each by Other route as needed for other. Use as instructed. Dx: E11.51 01/17/18   [name], NP  IRON PO Take 1 capsule by mouth daily.    [provider]  isosorbide mononitrate (IMDUR) 60 MG 24 hr tablet Take 60 mg by mouth daily. 12/11/19   [provider]  JANUVIA 100 MG tablet TAKE 1 TABLET BY MOUTH EVERY DAY Patient taking differently: Take 100 mg by mouth daily.  05/19/18   [name], DO  lidocaine (LIDODERM) 5 % Place 1 patch onto the skin daily. Remove & Discard patch within 12 hours or as  directed by MD Patient not taking: Reported on 02/22/2020 11/23/19   Blanchie Dessert, MD  Multiple Vitamin (MULTIVITAMIN WITH MINERALS) TABS tablet Take 1 tablet by mouth daily.    [provider]  NARCAN 4  MG/0.1ML LIQD nasal spray kit Place 4 mg into the nose as needed (opioid reversal).  06/18/19   [provider]  nitroGLYCERIN (NITROSTAT) 0.4 MG SL tablet PLACE 1 TABLET UNDER THE TONGUE EVERY 5 MINUTES AS NEEDED FOR CHEST PAIN Patient taking differently: Place 0.4 mg under the tongue every 5 (five) minutes as needed for chest pain.  07/17/19   Adrian Prows, MD  omeprazole (PRILOSEC) 40 MG capsule Take 40 mg by mouth 2 (two) times daily. 11/26/19   [provider]  oxyCODONE-acetaminophen (PERCOCET) 10-325 MG tablet Take 1 tablet by mouth every 12 (twelve) hours as needed for pain. 12/24/19   [provider]  PRALUENT 75 MG/ML SOAJ Inject 75 mg into the skin every 14 (fourteen) days. Patient not taking: Reported on 03/26/2020 10/11/19   [provider]  pravastatin (PRAVACHOL) 20 MG tablet Take 20 mg by mouth at bedtime. Patient not taking: Reported on 02/22/2020    [provider]  rOPINIRole (REQUIP) 1 MG tablet TAKE 2 TABLETS(2 MG) BY MOUTH AT BEDTIME 03/21/20   Hurst, Teresa T, PA-C  senna-docusate (SENOKOT-S) 8.6-50 MG tablet Take 2 tablets by mouth 2 (two) times daily.    [provider]  tamsulosin (FLOMAX) 0.4 MG CAPS capsule Take 1 capsule (0.4 mg total) by mouth 2 (two) times daily. Patient taking differently: Take 0.4 mg by mouth daily.  11/16/18   [name], NP    Allergies    Ivp dye [iodinated diagnostic agents], Penicillins, Sulfa antibiotics, Sulfonamide derivatives, Lisinopril, Metoprolol, Statins, and Propoxyphene n-acetaminophen  Review of Systems   Review of Systems  Respiratory: Positive for shortness of breath.   Cardiovascular: Positive for leg swelling.  Musculoskeletal:       Right finger pain  All other systems reviewed and are negative.   Physical Exam Updated Vital Signs BP (!) 134/102 (BP Location: Right Arm)   Pulse 64   Temp 98.6 F (37 C) (Oral)   Resp 20   SpO2 90%   Physical Exam Vitals and  nursing note reviewed.  Constitutional:      Appearance: Normal appearance.  HENT:     Head: Normocephalic and atraumatic.     Right Ear: External ear normal.     Left Ear: External ear normal.     Nose: Nose normal.     Mouth/Throat:     Mouth: Mucous membranes are moist.     Pharynx: Oropharynx is clear.  Eyes:     Extraocular Movements: Extraocular movements intact.     Conjunctiva/sclera: Conjunctivae normal.     Pupils: Pupils are equal, round, and reactive to light.  Cardiovascular:     Rate and Rhythm: Normal rate and regular rhythm.     Pulses: Normal pulses.     Heart sounds: Normal heart sounds.  Pulmonary:     Effort: Pulmonary effort is normal.     Breath sounds: Normal breath sounds.  Abdominal:     General: Abdomen is flat. Bowel sounds are normal.     Palpations: Abdomen is soft.  Musculoskeletal:     Left shoulder: Tenderness present. Decreased range of motion.     Cervical back: Normal range of motion and neck supple.  Skin:    Capillary Refill: Capillary refill takes less  than 2 seconds.     Comments: See picture.  Right middle finger cellulitis.  Neurological:     General: No focal deficit present.     Mental Status: He is alert and oriented to person, place, and time.  Psychiatric:        Mood and Affect: Mood normal.        Behavior: Behavior normal.        Thought Content: Thought content normal.        Judgment: Judgment normal.       ED Results / Procedures / Treatments   Labs (all labs ordered are listed, but only abnormal results are displayed) Labs Reviewed  COMPREHENSIVE METABOLIC PANEL - Abnormal; Notable for the following components:      Result Value   Sodium 128 (*)    Chloride 95 (*)    CO2 21 (*)    Glucose, Bld 227 (*)    BUN 28 (*)    Calcium 8.3 (*)    Albumin 2.9 (*)    AST 102 (*)    ALT 56 (*)    Alkaline Phosphatase 173 (*)    GFR calc non Af Amer 59 (*)    All other components within normal limits  CBC WITH  DIFFERENTIAL/PLATELET - Abnormal; Notable for the following components:   RBC 3.93 (*)    Hemoglobin 11.9 (*)    HCT 36.5 (*)    Lymphs Abs 0.5 (*)    Monocytes Absolute 1.1 (*)    Abs Immature Granulocytes 0.14 (*)    All other components within normal limits  APTT - Abnormal; Notable for the following components:   aPTT 39 (*)    All other components within normal limits  URINALYSIS, ROUTINE W REFLEX MICROSCOPIC - Abnormal; Notable for the following components:   Hgb urine dipstick SMALL (*)    Protein, ur 30 (*)    All other components within normal limits  BRAIN NATRIURETIC PEPTIDE - Abnormal; Notable for the following components:   B Natriuretic Peptide 626.2 (*)    All other components within normal limits  CBG MONITORING, ED - Abnormal; Notable for the following components:   Glucose-Capillary 245 (*)    All other components within normal limits  SARS CORONAVIRUS 2 BY RT PCR (HOSPITAL ORDER, [facility])  URINE CULTURE  CULTURE, BLOOD (ROUTINE X 2)  CULTURE, BLOOD (ROUTINE X 2)  LACTIC ACID, PLASMA  PROTIME-INR  LACTIC ACID, PLASMA    EKG EKG Interpretation  Date/Time:  Friday May 02 2020 15:00:46 EDT Ventricular Rate:  103 PR Interval:  148 QRS Duration: 108 QT Interval:  354 QTC Calculation: 463 R Axis:   55 Text Interpretation: Sinus tachycardia with occasional Premature ventricular complexes Otherwise normal ECG Since last tracing rate faster Confirmed by Isla Pence [phone]) on 05/02/2020 3:08:14 PM   Radiology DG Chest Port 1 View  Result Date: 05/02/2020 CLINICAL DATA:  Questionable sepsis. EXAM: PORTABLE CHEST 1 VIEW COMPARISON:  January 17, 2020 FINDINGS: Multiple sternal wires are noted. Mild, diffuse, chronic appearing increased lung markings are seen. A stable cluster of calcified lung nodules is seen along the medial aspect of the right apex. There is no evidence of acute infiltrate, pleural effusion or pneumothorax.  Radiopaque surgical clips are seen within the soft tissues of the neck on the left. The cardiac silhouette is mildly enlarged and unchanged in size. Degenerative changes seen within the bilateral shoulders and thoracic spine. IMPRESSION: Chronic appearing increased lung  markings without evidence of acute or active cardiopulmonary disease. Electronically Signed   By: Virgina Norfolk M.D.   On: 05/02/2020 15:56   DG Shoulder Left  Result Date: 05/02/2020 CLINICAL DATA:  Right shoulder pain EXAM: LEFT SHOULDER - 2+ VIEW COMPARISON:  05/17/2019 FINDINGS: Two view radiograph of the left shoulder demonstrates marked remodeling of the glenoid,, likely progressive since prior examination, but not well profiled on this examination. This is in keeping with end-stage degenerative change of the glenohumeral joint. No acute fracture or dislocation is clearly identified. There has developed mild widening of the acromioclavicular joint suggesting mild separation. There is soft tissue swelling superficial to the [facility] joint and acromion. Limited evaluation of the left apex is unremarkable IMPRESSION: Marked remodeling of the glenoid, not well profiled on this examination, most in keeping with advanced degenerative arthritis and likely progressive since prior examination. Probable left AC separation. No definite associated fracture. Soft tissue swelling superficial to the left AC joint and acromion. Electronically Signed   By: Fidela Salisbury MD   On: 05/02/2020 21:05   DG Hand Complete Right  Result Date: 05/02/2020 CLINICAL DATA:  Pain EXAM: RIGHT HAND - COMPLETE 3+ VIEW COMPARISON:  None. FINDINGS: Foreign body in the soft tissues between the first and second metacarpals. No acute fracture dislocation. No bony erosion. IMPRESSION: No acute abnormalities. Electronically Signed   By: Dorise Bullion III M.D   On: 05/02/2020 20:18    Procedures .Marland KitchenIncision and Drainage  Date/Time: 05/02/2020 8:03 PM Performed by:  Isla Pence, MD Authorized by: Isla Pence, MD   Consent:    Consent obtained:  Verbal   Consent given by:  Patient   Risks discussed:  Incomplete drainage   Alternatives discussed:  No treatment Location:    Type:  Abscess   Size:  1   Location:  Upper extremity   Upper extremity location:  Hand   Hand location:  R hand Pre-procedure details:    Skin preparation:  Betadine Procedure details:    Incision types:  Single straight   Scalpel blade:  11   Wound management:  Probed and deloculated   Drainage:  Purulent   Drainage amount:  Moderate   Packing materials:  None Post-procedure details:    Patient tolerance of procedure:  Tolerated well, no immediate complications   (including critical care time)  Medications Ordered in ED Medications  bupivacaine (MARCAINE) 0.5 % injection 10 mL (10 mLs Infiltration Given 05/02/20 1900)  furosemide (LASIX) injection 40 mg (40 mg Intravenous Given 05/02/20 1920)  cephALEXin (KEFLEX) capsule 500 mg (500 mg Oral Given 05/02/20 1859)    ED Course  I have reviewed the triage vital signs and the nursing notes.  Pertinent labs & imaging results that were available during my care of the patient were reviewed by me and considered in my medical decision making (see chart for details).    MDM Rules/Calculators/A&P                          Pt's BNP is much better.  Na is 128 which is chronic for pt.  Pt is not having any sob now after lasix 40 mg IV.  Pt does not appear to be septic.    Pt's hand I&D'd.  Pt will be put on keflex.  F/u with Dr. Caralyn Guile (hand).  FB is no where near his abscess.  It has likely been there a long time.    Pt's shoulder  pain is chronic per pt.  Pt does follow with an orthopedist.  Pt did have a fall while trying to use the bathroom, but had no injury.  Pt is instructed to return if worse and to f/u with pcp.   Final Clinical Impression(s) / ED Diagnoses Final diagnoses:  Acute on chronic congestive  heart failure, unspecified heart failure type ([place])  Hyponatremia  Abscess of finger of right hand    Rx / DC Orders ED Discharge Orders    None       Isla Pence, MD 05/02/20 2113

## 2020-05-02 NOTE — ED Notes (Signed)
The pt fell several days ago  He has lt shoulder pain  Since and he has a rt middle finger swollen and red for undetermined length of time   He reports that he cannot move his lt arm since the fall

## 2020-05-02 NOTE — ED Notes (Signed)
Pt had been placed on a bedside commode  I had just left the room  The pt said nothing about finishing with the bedpan on the bedside commode   The tech had gone in and found the pt in the floor  He reports that he slid off the bedside commode  He was helped back to bed  Cautioned not to get up again  No reports of injury  Dr Gilford Raid notified of the pts fall

## 2020-05-02 NOTE — ED Notes (Signed)
He has taken his clothesoff a to nd is sitting on thenbedside commode of to sit on the bedside commode

## 2020-05-02 NOTE — ED Triage Notes (Signed)
Pt c/o leg swelling and infection on left finger; stated SOB at baseline.

## 2020-05-03 LAB — URINE CULTURE: Culture: NO GROWTH

## 2020-05-04 ENCOUNTER — Emergency Department (HOSPITAL_COMMUNITY)
Admission: EM | Admit: 2020-05-04 | Discharge: 2020-05-04 | Disposition: A | Discharge status: Home / Self Care | Payer: Medicare Other | Attending: Emergency Medicine | Admitting: Emergency Medicine

## 2020-05-04 ENCOUNTER — Other Ambulatory Visit: Payer: Self-pay

## 2020-05-04 ENCOUNTER — Encounter (HOSPITAL_COMMUNITY): Payer: Self-pay | Admitting: Emergency Medicine

## 2020-05-04 DIAGNOSIS — R609 Edema, unspecified: Secondary | ICD-10-CM | POA: Diagnosis not present

## 2020-05-04 DIAGNOSIS — L089 Local infection of the skin and subcutaneous tissue, unspecified: Secondary | ICD-10-CM | POA: Diagnosis not present

## 2020-05-04 DIAGNOSIS — R531 Weakness: Secondary | ICD-10-CM | POA: Diagnosis not present

## 2020-05-04 DIAGNOSIS — W010XXA Fall on same level from slipping, tripping and stumbling without subsequent striking against object, initial encounter: Secondary | ICD-10-CM | POA: Insufficient documentation

## 2020-05-04 DIAGNOSIS — Z5321 Procedure and treatment not carried out due to patient leaving prior to being seen by health care provider: Secondary | ICD-10-CM | POA: Insufficient documentation

## 2020-05-04 DIAGNOSIS — M25512 Pain in left shoulder: Secondary | ICD-10-CM | POA: Insufficient documentation

## 2020-05-04 LAB — BASIC METABOLIC PANEL
Anion gap: 11 (ref 5–15)
BUN: 39 mg/dL — ABNORMAL HIGH (ref 8–23)
CO2: 24 mmol/L (ref 22–32)
Calcium: 8.3 mg/dL — ABNORMAL LOW (ref 8.9–10.3)
Chloride: 92 mmol/L — ABNORMAL LOW (ref 98–111)
Creatinine, Ser: 1.7 mg/dL — ABNORMAL HIGH (ref 0.61–1.24)
GFR calc Af Amer: 45 mL/min — ABNORMAL LOW (ref >60)
GFR calc non Af Amer: 39 mL/min — ABNORMAL LOW (ref >60)
Glucose, Bld: 240 mg/dL — ABNORMAL HIGH (ref 70–99)
Potassium: 4.6 mmol/L (ref 3.5–5.1)
Sodium: 127 mmol/L — ABNORMAL LOW (ref 135–145)

## 2020-05-04 LAB — CBC
HCT: 34.5 % — ABNORMAL LOW (ref 39.0–52.0)
Hemoglobin: 11.2 g/dL — ABNORMAL LOW (ref 13.0–17.0)
MCH: 30.5 pg (ref 26.0–34.0)
MCHC: 32.5 g/dL (ref 30.0–36.0)
MCV: 94 fL (ref 80.0–100.0)
Platelets: 351 10*3/uL (ref 150–400)
RBC: 3.67 MIL/uL — ABNORMAL LOW (ref 4.22–5.81)
RDW: 14.5 % (ref 11.5–15.5)
WBC: 11 10*3/uL — ABNORMAL HIGH (ref 4.0–10.5)
nRBC: 0 % (ref 0.0–0.2)

## 2020-05-04 LAB — LACTIC ACID, PLASMA: Lactic Acid, Venous: 0.7 mmol/L (ref 0.5–1.9)

## 2020-05-04 NOTE — ED Triage Notes (Addendum)
Pt to lobby by [facility] from home.  Pt slipped and fell out of wheelchair during the night.  Reports he also fell the night before.  C/o generalized weakness.  Old bruising to upper back. Also reports L shoulder pain with history of same.  No blood thinners.  Alert and oriented.  Pt also has infection to right middle finger with swelling and discoloration.  States it has been like that for "a while" and that he had it drained but now it is worse.  Denies fever and chills.

## 2020-05-04 NOTE — ED Notes (Signed)
Patient left without being seen.

## 2020-05-06 NOTE — H&P (Signed)
[name] is an 74 y.o. male.   Chief Complaint: RIGHT MIDDLE FINGER PAIN   HPI: the patient is a 74 year old right-hand dominant male who has had swelling and discomfort in the right long finger for several months.  It worsened about 2 weeks ago with increased swelling, erythema, warmth, and purulent drainage.  He was seen in the hospital on 05/02/20 and underwent an incision and drainage of the long finger. He was seen in our office for further evaluation.  He has increased swelling and discoloration at the distal tip of the long finger.  Discussed the reason and rationale for surgical intervention to clean the finger and the possibility of amputation. He is here today for surgery. He denies chest pain, shortness of breath, fever, chills, nausea, vomiting, diarrhea.  Past Medical History:  Diagnosis Date  . Anemia    NOS  . Anxiety   . 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 BPH (benign prostatic hypertrophy)   . Broken foot Oct. 12, 2013   Right foot Fx  . CAD (coronary artery disease)   . Carotid stenosis, left   . Cellulitis and abscess of leg 03/2018   right leg  . Chronic hyponatremia   . Chronic pain syndrome   . COPD (chronic obstructive pulmonary disease) ([place])   . Depression   . Diabetes mellitus   . Diabetic foot ulcer ([place])   . DJD (degenerative joint disease)    L wrist  . DM2 (diabetes mellitus, type 2) ([place])   . GERD (gastroesophageal reflux disease)   . HLD (hyperlipidemia)   . HTN (hypertension)   . Hyponatremia   . Lumbar disc disease   . MI (mitral incompetence)   . Osteoporosis    tx per [facility], Dr Prudencio Burly, thought due to heavy steriod use after 1978  . Peripheral neuropathy   . Peripheral vascular disease ([place])   . Spine fracture    hx, multiple  . Varicose veins   . 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 Vitamin D deficiency     Past Surgical History:  Procedure Laterality Date  . ANGIOPLASTY    . aorta  bifemoral bypass grafting  09/2010  . CARDIAC CATHETERIZATION N/A 09/19/2015   Procedure: Left Heart Cath and Coronary Angiography;  Surgeon: Adrian Prows, MD;  Location: [facility];  Service: Cardiovascular;  Laterality: N/A;  . CARPAL TUNNEL RELEASE     right 2006/ left 2007  . CATARACT EXTRACTION     left 1996/ right 1997  . CORONARY ARTERY BYPASS GRAFT N/A 09/22/2015   Procedure: CORONARY ARTERY BYPASS GRAFTING (CABG) times four using the right greater saphenous vein harvested endoscopically and the left internal mammary artery.  LIMA-LAD, SEQ SVG-DIAG & OM, SVG-PD.;  Surgeon: [name], MD;  Location: [facility];  Service: Open Heart Surgery;  Laterality: N/A;  . I & D EXTREMITY Right 04/07/2018   Procedure: IRRIGATION AND DEBRIDEMENT ABSCESS RIGHT LEG;  Surgeon: Newt Minion, MD;  Location: [facility];  Service: Orthopedics;  Laterality: Right;  . L foot open repair jones fracture  2010    5th metetarsal   . left ankle ganglion cyst  1976  . left carotid endarterectomy  03/2011  . left cataract  1996   right - 1997  . left CTS  2006   R CTS - 2007  . left foot surgery  1998   R surgery/fracture -  1999  . left plantar ganglion cystectomy  1979  . left wrist/hand fusion  2008  . [place], 2004  . NASAL SINUS SURGERY     multiple- x8. last 1997 with obliteration  . REPAIR THORACIC AORTA  2017  . right hand fracture  1969  . ROTATOR CUFF REPAIR  2006   R, than repeat 2011, Dr Theda Sers  . TRIGGER FINGER RELEASE Left 11/11/2014   Procedure: LEFT LONG FINGER RELEASE TRIGGER FINGER/A-1 PULLEY;  Surgeon: Milly Jakob, MD;  Location: [facility];  Service: Orthopedics;  Laterality: Left;  . vocal surgery  1996    Family History  Problem Relation Age of Onset  . Lung cancer Father 18  . Diabetes Mother 48  . Osteoporosis Mother   . Depression Mother   . Bleeding Disorder Mother   . Throat cancer Other        family hx - "bad living" - also lung CA,  heart disease and renal failure    . Diabetes Sister   . Suicidality Son 80  . Lung cancer Maternal Grandmother 69  . Diabetes Maternal Grandfather 1  . Heart disease Maternal Grandfather   . Heart attack Paternal Grandmother   . Brain cancer Paternal Grandfather 43   Social History:  reports that he quit smoking about 31 years ago. His smoking use included cigarettes. He has a 45.00 pack-year smoking history. He has never used smokeless tobacco. He reports previous alcohol use. He reports that he does not use drugs.  Allergies:  Allergies  Allergen Reactions  . Ivp Dye [Iodinated Diagnostic Agents]     Blood Pressure dropped----pt was pre-medicated with 13 hour prep and did fine with pre-meds--amy 03/09/11   . Penicillins Nausea Only    REACTION: stomach pain(IV ok) Has patient had a PCN reaction causing immediate rash, facial/tongue/throat swelling, SOB or lightheadedness with hypotension: No Has patient had a PCN reaction causing severe rash involving mucus membranes or skin necrosis: No Has patient had a PCN reaction that required hospitalization: No Has patient had a PCN reaction occurring within the last 10 years: No If all of the above answers are "NO", then may proceed with Cephalosporin use.   . Sulfa Antibiotics Hives  . Sulfonamide Derivatives Hives and Itching  . Lisinopril Cough  . Metoprolol Nausea And Vomiting  . Statins Other (See Comments)    myalgias  . Propoxyphene N-Acetaminophen Other (See Comments)    Sharp pains- headache    No medications prior to admission.    No results found for this or any previous visit (from the past 48 hour(s)). No results found.  ROS NO RECENT ILLNESSES OR HOSPITALIZATIONS  There were no vitals taken for this visit. Physical Exam  General Appearance:  Alert, cooperative, no distress, appears stated age  Head:  Normocephalic, without obvious abnormality, atraumatic  Eyes:  Pupils equal, conjunctiva/corneas clear,          Throat: Lips, mucosa, and tongue normal; teeth and gums normal  Neck: No visible masses     Lungs:   respirations unlabored  Chest Wall:  No tenderness or deformity  Heart:  Regular rate and rhythm,  Abdomen:   Soft, non-tender,         Extremities: RUE - ERYTHEMA, PURULENT DRAINAGE, AND SWELLING OF THE LONG FINGER. DISCOLORATION WITH DECREASED CAPILLARY REFILL TO THE DISTAL TIP OF THE LONG FINGER. LIMITED FLEXION AND EXTENSION OF THE LONG FINGER. ALL OTHER DIGITS ARE UNREMARKABLE.  Pulses: 2+ and symmetric  Skin: Skin color, texture, turgor normal, no rashes or lesions     Neurologic: Normal     Assessment/Plan RIGHT LONG FINGER INFECTION AND GANGRENE    - RIGHT LONG FINGER IRRIGATION AND DEBRIDEMENT WITH POSSIBLE AMPUTATION  R/B/A DISCUSSED WITH PT IN OFFICE.  PT VOICED UNDERSTANDING OF PLAN CONSENT SIGNED DAY OF SURGERY PT SEEN AND EXAMINED PRIOR TO OPERATIVE PROCEDURE/DAY OF SURGERY SITE MARKED. QUESTIONS ANSWERED WILL GO HOME FOLLOWING SURGERY  WE ARE PLANNING SURGERY FOR YOUR UPPER EXTREMITY. THE RISKS AND BENEFITS OF SURGERY INCLUDE BUT NOT LIMITED TO BLEEDING INFECTION, DAMAGE TO NEARBY NERVES ARTERIES TENDONS, FAILURE OF SURGERY TO ACCOMPLISH ITS INTENDED GOALS, PERSISTENT SYMPTOMS AND NEED FOR FURTHER SURGICAL INTERVENTION. WITH THIS IN MIND WE WILL PROCEED. I HAVE DISCUSSED WITH THE PATIENT THE PRE AND POSTOPERATIVE REGIMEN AND THE DOS AND DON'TS. PT VOICED UNDERSTANDING AND INFORMED CONSENT SIGNED.  [facility] MD 05/07/20  Brynda Peon 05/06/2020, 5:03 PM

## 2020-05-06 NOTE — Progress Notes (Addendum)
I called [name]' cell number- the mail box is full. I called the preferred number and left a message asking patient to call me. I called patient's emergency contact- [name]. Ms [name] said that she is bring patient to the hospital tomorrow- she does not know who is driving him home.  Ms [name] she would call patient and tell him to answer my next call,  "he can't get to the phone at times."  I was unable to reach patient by phone.  I left  A message on voice mail.  I instructed the patient to arrive at [facility] entrance at -1215- , register in the [place]. DO NOT eat or anything after midnight. May have clear liquids until 1215- clear liquids- water, cranberry juice, apple juice, Gatorade, tea, black coffee. I instructed the patient to take the following medications in the am with just enough water to get them down: Bupropion, Gabapentin, Isosorbide, Omeprazole; if needed use Albuterol inhaler- bring it with you, Clonazepam, NTG, Percocet. DO NOT take DM medications.  I instructed patient to check CBG after awaking and every 2 hours until arrival  to the hospital.  I Instructed patient if CBG is less than 70 to drink 1/2 cup of a clear juice. Recheck CBG in 15 minutes then call pre- op desk at [phone] for further instructions.   I asked patient to shower, wear no lotions, powders, cologne, jewelry, piercing, make-up or nail polish.  Wear clean clothes. Brush teeth. I informed patient that there will need to be a driver and someone to stay with him for the first 24 hours after surgery.   I instructed  patient to call [phone], in the am if there were any questions or problems.  I spoke with Dr Conrad [place] about [name] history, CAD, CHF, DM, elevated creatine, no new orders.

## 2020-05-07 ENCOUNTER — Ambulatory Visit: Payer: Medicare Other | Admitting: Physician Assistant

## 2020-05-07 ENCOUNTER — Encounter (HOSPITAL_COMMUNITY)
Admission: RE | Disposition: A | Discharge status: Home / Self Care | Payer: Self-pay | Source: Home / Self Care | Attending: Orthopedic Surgery

## 2020-05-07 ENCOUNTER — Other Ambulatory Visit: Payer: Self-pay

## 2020-05-07 ENCOUNTER — Ambulatory Visit (HOSPITAL_COMMUNITY): Payer: Medicare Other | Admitting: Anesthesiology

## 2020-05-07 ENCOUNTER — Ambulatory Visit (HOSPITAL_COMMUNITY)
Admission: RE | Admit: 2020-05-07 | Discharge: 2020-05-07 | Disposition: A | Discharge status: Home / Self Care | Payer: Medicare Other | Attending: Orthopedic Surgery | Admitting: Orthopedic Surgery

## 2020-05-07 ENCOUNTER — Encounter (HOSPITAL_COMMUNITY): Payer: Self-pay | Admitting: Orthopedic Surgery

## 2020-05-07 DIAGNOSIS — Z880 Allergy status to penicillin: Secondary | ICD-10-CM | POA: Diagnosis not present

## 2020-05-07 DIAGNOSIS — E1136 Type 2 diabetes mellitus with diabetic cataract: Secondary | ICD-10-CM | POA: Insufficient documentation

## 2020-05-07 DIAGNOSIS — Z91041 Radiographic dye allergy status: Secondary | ICD-10-CM | POA: Diagnosis not present

## 2020-05-07 DIAGNOSIS — E1152 Type 2 diabetes mellitus with diabetic peripheral angiopathy with gangrene: Secondary | ICD-10-CM | POA: Diagnosis not present

## 2020-05-07 DIAGNOSIS — I2510 Atherosclerotic heart disease of native coronary artery without angina pectoris: Secondary | ICD-10-CM | POA: Diagnosis not present

## 2020-05-07 DIAGNOSIS — Z951 Presence of aortocoronary bypass graft: Secondary | ICD-10-CM | POA: Diagnosis not present

## 2020-05-07 DIAGNOSIS — M1990 Unspecified osteoarthritis, unspecified site: Secondary | ICD-10-CM | POA: Diagnosis not present

## 2020-05-07 DIAGNOSIS — I6522 Occlusion and stenosis of left carotid artery: Secondary | ICD-10-CM | POA: Diagnosis not present

## 2020-05-07 DIAGNOSIS — Z8249 Family history of ischemic heart disease and other diseases of the circulatory system: Secondary | ICD-10-CM | POA: Insufficient documentation

## 2020-05-07 DIAGNOSIS — J449 Chronic obstructive pulmonary disease, unspecified: Secondary | ICD-10-CM | POA: Diagnosis not present

## 2020-05-07 DIAGNOSIS — Z20822 Contact with and (suspected) exposure to covid-19: Secondary | ICD-10-CM | POA: Insufficient documentation

## 2020-05-07 DIAGNOSIS — Z79891 Long term (current) use of opiate analgesic: Secondary | ICD-10-CM | POA: Insufficient documentation

## 2020-05-07 DIAGNOSIS — N400 Enlarged prostate without lower urinary tract symptoms: Secondary | ICD-10-CM | POA: Diagnosis not present

## 2020-05-07 DIAGNOSIS — Z8262 Family history of osteoporosis: Secondary | ICD-10-CM | POA: Diagnosis not present

## 2020-05-07 DIAGNOSIS — Z8673 Personal history of transient ischemic attack (TIA), and cerebral infarction without residual deficits: Secondary | ICD-10-CM | POA: Diagnosis not present

## 2020-05-07 DIAGNOSIS — H5462 Unqualified visual loss, left eye, normal vision right eye: Secondary | ICD-10-CM | POA: Diagnosis not present

## 2020-05-07 DIAGNOSIS — I10 Essential (primary) hypertension: Secondary | ICD-10-CM | POA: Insufficient documentation

## 2020-05-07 DIAGNOSIS — Z882 Allergy status to sulfonamides status: Secondary | ICD-10-CM | POA: Diagnosis not present

## 2020-05-07 DIAGNOSIS — K219 Gastro-esophageal reflux disease without esophagitis: Secondary | ICD-10-CM | POA: Insufficient documentation

## 2020-05-07 DIAGNOSIS — Z87891 Personal history of nicotine dependence: Secondary | ICD-10-CM | POA: Insufficient documentation

## 2020-05-07 DIAGNOSIS — M810 Age-related osteoporosis without current pathological fracture: Secondary | ICD-10-CM | POA: Insufficient documentation

## 2020-05-07 DIAGNOSIS — Z888 Allergy status to other drugs, medicaments and biological substances status: Secondary | ICD-10-CM | POA: Diagnosis not present

## 2020-05-07 DIAGNOSIS — G894 Chronic pain syndrome: Secondary | ICD-10-CM | POA: Diagnosis not present

## 2020-05-07 DIAGNOSIS — M79672 Pain in left foot: Secondary | ICD-10-CM | POA: Insufficient documentation

## 2020-05-07 DIAGNOSIS — E785 Hyperlipidemia, unspecified: Secondary | ICD-10-CM | POA: Insufficient documentation

## 2020-05-07 DIAGNOSIS — M542 Cervicalgia: Secondary | ICD-10-CM | POA: Insufficient documentation

## 2020-05-07 DIAGNOSIS — Z833 Family history of diabetes mellitus: Secondary | ICD-10-CM | POA: Insufficient documentation

## 2020-05-07 DIAGNOSIS — E1142 Type 2 diabetes mellitus with diabetic polyneuropathy: Secondary | ICD-10-CM | POA: Diagnosis not present

## 2020-05-07 DIAGNOSIS — I70211 Atherosclerosis of native arteries of extremities with intermittent claudication, right leg: Secondary | ICD-10-CM

## 2020-05-07 DIAGNOSIS — I6523 Occlusion and stenosis of bilateral carotid arteries: Secondary | ICD-10-CM

## 2020-05-07 DIAGNOSIS — E1151 Type 2 diabetes mellitus with diabetic peripheral angiopathy without gangrene: Secondary | ICD-10-CM

## 2020-05-07 HISTORY — PX: INCISION AND DRAINAGE OF WOUND: SHX1803

## 2020-05-07 LAB — SARS CORONAVIRUS 2 BY RT PCR (HOSPITAL ORDER, PERFORMED IN ~~LOC~~ HOSPITAL LAB): SARS Coronavirus 2: NEGATIVE

## 2020-05-07 LAB — POCT I-STAT, CHEM 8
BUN: 17 mg/dL (ref 8–23)
BUN: 13 mg/dL (ref 8–23)
Calcium, Ion: 1.07 mmol/L — ABNORMAL LOW (ref 1.15–1.40)
Calcium, Ion: 1.1 mmol/L — ABNORMAL LOW (ref 1.15–1.40)
Chloride: 93 mmol/L — ABNORMAL LOW (ref 98–111)
Chloride: 95 mmol/L — ABNORMAL LOW (ref 98–111)
Creatinine, Ser: 0.8 mg/dL (ref 0.61–1.24)
Creatinine, Ser: 0.6 mg/dL — ABNORMAL LOW (ref 0.61–1.24)
Glucose, Bld: 195 mg/dL — ABNORMAL HIGH (ref 70–99)
Glucose, Bld: 164 mg/dL — ABNORMAL HIGH (ref 70–99)
HCT: 34 % — ABNORMAL LOW (ref 39.0–52.0)
HCT: 32 % — ABNORMAL LOW (ref 39.0–52.0)
Hemoglobin: 11.6 g/dL — ABNORMAL LOW (ref 13.0–17.0)
Hemoglobin: 10.9 g/dL — ABNORMAL LOW (ref 13.0–17.0)
Potassium: 5 mmol/L (ref 3.5–5.1)
Potassium: 4.7 mmol/L (ref 3.5–5.1)
Sodium: 129 mmol/L — ABNORMAL LOW (ref 135–145)
Sodium: 130 mmol/L — ABNORMAL LOW (ref 135–145)
TCO2: 25 mmol/L (ref 22–32)
TCO2: 22 mmol/L (ref 22–32)

## 2020-05-07 LAB — CULTURE, BLOOD (ROUTINE X 2)
Culture: NO GROWTH
Culture: NO GROWTH

## 2020-05-07 LAB — GLUCOSE, CAPILLARY
Glucose-Capillary: 192 mg/dL — ABNORMAL HIGH (ref 70–99)
Glucose-Capillary: 160 mg/dL — ABNORMAL HIGH (ref 70–99)

## 2020-05-07 SURGERY — IRRIGATION AND DEBRIDEMENT WOUND
Anesthesia: Monitor Anesthesia Care | Site: Finger | Laterality: Right

## 2020-05-07 MED ORDER — LIDOCAINE 2% (20 MG/ML) 5 ML SYRINGE
INTRAMUSCULAR | Status: AC
  Filled 2020-05-07: qty 20

## 2020-05-07 MED ORDER — ONDANSETRON HCL 4 MG/2ML IJ SOLN
INTRAMUSCULAR | Status: AC
  Filled 2020-05-07: qty 14

## 2020-05-07 MED ORDER — PHENYLEPHRINE 40 MCG/ML (10ML) SYRINGE FOR IV PUSH (FOR BLOOD PRESSURE SUPPORT)
PREFILLED_SYRINGE | INTRAVENOUS | Status: AC
  Filled 2020-05-07: qty 30

## 2020-05-07 MED ORDER — PROPOFOL 500 MG/50ML IV EMUL
INTRAVENOUS | Status: DC | PRN
  Administered 2020-05-07: 75 ug/kg/min via INTRAVENOUS

## 2020-05-07 MED ORDER — VANCOMYCIN HCL IN DEXTROSE 1-5 GM/200ML-% IV SOLN
1000.0000 mg | INTRAVENOUS | Status: AC
  Administered 2020-05-07: 1000 mg via INTRAVENOUS
  Filled 2020-05-07: qty 200

## 2020-05-07 MED ORDER — SODIUM CHLORIDE 0.9 % IR SOLN
Status: DC | PRN
  Administered 2020-05-07: 1000 mL

## 2020-05-07 MED ORDER — LACTATED RINGERS IV SOLN: INTRAVENOUS | Status: DC

## 2020-05-07 MED ORDER — INSULIN ASPART 100 UNIT/ML IV SOLN
5.0000 [IU] | Freq: Once | INTRAVENOUS | Status: AC
  Administered 2020-05-07: 5 [IU] via INTRAVENOUS
  Filled 2020-05-07: qty 0.05

## 2020-05-07 MED ORDER — LABETALOL HCL 5 MG/ML IV SOLN
INTRAVENOUS | Status: AC
  Filled 2020-05-07: qty 4

## 2020-05-07 MED ORDER — DEXAMETHASONE SODIUM PHOSPHATE 10 MG/ML IJ SOLN
INTRAMUSCULAR | Status: AC
  Filled 2020-05-07: qty 2

## 2020-05-07 MED ORDER — LACTATED RINGERS IV SOLN: INTRAVENOUS | Status: DC | PRN

## 2020-05-07 MED ORDER — FENTANYL CITRATE (PF) 100 MCG/2ML IJ SOLN: 50.0000 ug | Freq: Once | INTRAMUSCULAR | Status: AC

## 2020-05-07 MED ORDER — DEXTROSE 50 % IV SOLN: 25.0000 mL | Freq: Once | INTRAVENOUS | Status: AC

## 2020-05-07 MED ORDER — EPHEDRINE 5 MG/ML INJ
INTRAVENOUS | Status: AC
  Filled 2020-05-07: qty 20

## 2020-05-07 MED ORDER — ROCURONIUM BROMIDE 10 MG/ML (PF) SYRINGE
PREFILLED_SYRINGE | INTRAVENOUS | Status: AC
  Filled 2020-05-07: qty 60

## 2020-05-07 MED ORDER — PROPOFOL 10 MG/ML IV BOLUS
INTRAVENOUS | Status: AC
  Filled 2020-05-07: qty 20

## 2020-05-07 MED ORDER — FENTANYL CITRATE (PF) 100 MCG/2ML IJ SOLN
INTRAMUSCULAR | Status: AC
  Administered 2020-05-07: 50 ug via INTRAVENOUS
  Filled 2020-05-07: qty 2

## 2020-05-07 MED ORDER — ROPIVACAINE HCL 5 MG/ML IJ SOLN
INTRAMUSCULAR | Status: DC | PRN
  Administered 2020-05-07: 30 mL via EPIDURAL

## 2020-05-07 MED ORDER — DEXTROSE 50 % IV SOLN
INTRAVENOUS | Status: AC
  Administered 2020-05-07: 25 mL via INTRAVENOUS
  Filled 2020-05-07: qty 50

## 2020-05-07 MED ORDER — CHLORHEXIDINE GLUCONATE 0.12 % MT SOLN
15.0000 mL | Freq: Once | OROMUCOSAL | Status: AC
  Administered 2020-05-07: 15 mL via OROMUCOSAL
  Filled 2020-05-07: qty 15

## 2020-05-07 MED ORDER — FENTANYL CITRATE (PF) 250 MCG/5ML IJ SOLN
INTRAMUSCULAR | Status: AC
  Filled 2020-05-07: qty 5

## 2020-05-07 SURGICAL SUPPLY — 55 items
BNDG COHESIVE 1X5 TAN STRL LF (GAUZE/BANDAGES/DRESSINGS) ×2 IMPLANT
BNDG CONFORM 2 STRL LF (GAUZE/BANDAGES/DRESSINGS) ×2 IMPLANT
BNDG ELASTIC 3X5.8 VLCR STR LF (GAUZE/BANDAGES/DRESSINGS) ×2 IMPLANT
BNDG ELASTIC 4X5.8 VLCR STR LF (GAUZE/BANDAGES/DRESSINGS) ×2 IMPLANT
BNDG ESMARK 4X9 LF (GAUZE/BANDAGES/DRESSINGS) ×2 IMPLANT
BNDG GAUZE ELAST 4 BULKY (GAUZE/BANDAGES/DRESSINGS) IMPLANT
CORD BIPOLAR FORCEPS 12FT (ELECTRODE) ×2 IMPLANT
COVER SURGICAL LIGHT HANDLE (MISCELLANEOUS) ×2 IMPLANT
COVER WAND RF STERILE (DRAPES) ×2 IMPLANT
CUFF TOURN SGL QUICK 18X4 (TOURNIQUET CUFF) ×2 IMPLANT
CUFF TOURN SGL QUICK 24 (TOURNIQUET CUFF)
CUFF TRNQT CYL 24X4X16.5-23 (TOURNIQUET CUFF) IMPLANT
DRAIN PENROSE 1/4X12 LTX STRL (WOUND CARE) IMPLANT
DRAPE SURG 17X23 STRL (DRAPES) ×2 IMPLANT
DRSG ADAPTIC 3X8 NADH LF (GAUZE/BANDAGES/DRESSINGS) ×2 IMPLANT
ELECT REM PT RETURN 9FT ADLT (ELECTROSURGICAL)
ELECTRODE REM PT RTRN 9FT ADLT (ELECTROSURGICAL) IMPLANT
GAUZE SPONGE 4X4 12PLY STRL (GAUZE/BANDAGES/DRESSINGS) ×2 IMPLANT
GAUZE XEROFORM 1X8 LF (GAUZE/BANDAGES/DRESSINGS) IMPLANT
GAUZE XEROFORM 5X9 LF (GAUZE/BANDAGES/DRESSINGS) IMPLANT
GLOVE BIOGEL PI IND STRL 8.5 (GLOVE) ×1 IMPLANT
GLOVE BIOGEL PI INDICATOR 8.5 (GLOVE) ×1
GLOVE SURG ORTHO 8.0 STRL STRW (GLOVE) ×2 IMPLANT
GOWN STRL REUS W/ TWL LRG LVL3 (GOWN DISPOSABLE) ×1 IMPLANT
GOWN STRL REUS W/ TWL XL LVL3 (GOWN DISPOSABLE) ×1 IMPLANT
GOWN STRL REUS W/TWL LRG LVL3 (GOWN DISPOSABLE) ×1
GOWN STRL REUS W/TWL XL LVL3 (GOWN DISPOSABLE) ×1
HANDPIECE INTERPULSE COAX TIP (DISPOSABLE)
KIT BASIN OR (CUSTOM PROCEDURE TRAY) ×2 IMPLANT
KIT TURNOVER KIT B (KITS) ×2 IMPLANT
MANIFOLD NEPTUNE II (INSTRUMENTS) IMPLANT
NEEDLE HYPO 25GX1X1/2 BEV (NEEDLE) IMPLANT
NS IRRIG 1000ML POUR BTL (IV SOLUTION) ×2 IMPLANT
PACK ORTHO EXTREMITY (CUSTOM PROCEDURE TRAY) ×2 IMPLANT
PAD ARMBOARD 7.5X6 YLW CONV (MISCELLANEOUS) ×4 IMPLANT
PAD CAST 4YDX4 CTTN HI CHSV (CAST SUPPLIES) IMPLANT
PADDING CAST COTTON 4X4 STRL (CAST SUPPLIES)
SET CYSTO W/LG BORE CLAMP LF (SET/KITS/TRAYS/PACK) IMPLANT
SET HNDPC FAN SPRY TIP SCT (DISPOSABLE) IMPLANT
SOAP 2 % CHG 4 OZ (WOUND CARE) ×2 IMPLANT
SPONGE LAP 18X18 RF (DISPOSABLE) ×2 IMPLANT
SPONGE LAP 4X18 RFD (DISPOSABLE) ×2 IMPLANT
SUT ETHILON 4 0 PS 2 18 (SUTURE) IMPLANT
SUT ETHILON 5 0 P 3 18 (SUTURE)
SUT NYLON ETHILON 5-0 P-3 1X18 (SUTURE) IMPLANT
SUT PROLENE 4 0 PS 2 18 (SUTURE) ×4 IMPLANT
SWAB COLLECTION DEVICE MRSA (MISCELLANEOUS) IMPLANT
SWAB CULTURE ESWAB REG 1ML (MISCELLANEOUS) IMPLANT
SYR CONTROL 10ML LL (SYRINGE) IMPLANT
TOWEL GREEN STERILE (TOWEL DISPOSABLE) ×2 IMPLANT
TOWEL GREEN STERILE FF (TOWEL DISPOSABLE) ×2 IMPLANT
TUBE CONNECTING 12X1/4 (SUCTIONS) ×2 IMPLANT
UNDERPAD 30X36 HEAVY ABSORB (UNDERPADS AND DIAPERS) ×2 IMPLANT
WATER STERILE IRR 1000ML POUR (IV SOLUTION) IMPLANT
YANKAUER SUCT BULB TIP NO VENT (SUCTIONS) ×2 IMPLANT

## 2020-05-07 NOTE — Op Note (Signed)
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 Iran Planas who scrubbed and present for the entire procedure  ASSISTANT SURGEON: Gertie Fey, PA-C was scrubbed and necessary for amputation closure splinting dressing application in a timely fashion  ANESTHESIA: Regional with IV sedation  OPERATIVE PROCEDURE: Right long finger amputation to the level of the middle phalanx with local neurectomies and advancement flap closure Right long finger flexor sheath irrigation debridement  IMPLANTS: None  RADIOGRAPHIC INTERPRETATION: None  SURGICAL INDICATIONS: Patient is a right-hand-dominant gentleman who had a worsening infection to the right long finger over several weeks.  Patient presented to the office with a dead long fingertip.  Patient was recommended undergo the above procedure.  The risks of surgery include but not limited to bleeding infection damage nearby nerves arteries or tendons loss of motion of the wrist and digits incomplete relief of symptoms and need for further surgical invention.  SURGICAL TECHNIQUE: Patient was brought identified the preoperative holding area marked for marker made the right long finger to indicate correct operative site.  Patient brought back operating placed supine on anesthesia table where the regional anesthetic of been administered.  Preoperative antibiotics were given prior to skin incision.  Well-padded tourniquet placed on the right brachium and seal with the appropriate drape.  The right upper extremities then prepped and draped in normal sterile fashion.  A timeout was called the correct site identified procedure then begun.  The patient did have the gangrene of the DIP joint distally.  A fishmouth incision was then marked out on the long finger.  Dissection carried down through the skin and subcutaneous tissue.  There is gross purulence.  This was not a salvageable  finger with fingertip and a dead fingertip.  Following this completion of the amputation was then carried out incising and going through the FDP.  The FDP was then identified and allowed to retract proximally after was resected.  The extensor mechanism was resected.  Bone cutter was then used to resect through the level of the middle phalanx.  The flexor sheath was then copiously irrigated.  Flexor sheath drainage was then carried out volarly.  The wound was thoroughly irrigated.  Local neurectomies were then carried out.  Given the soft tissue deficits advancement flap VY flaps were then created allowing the skin to be advanced and closed.  The wound was copiously irrigated and the skin was then loosely reapproximated with Prolene suture.  Adaptic dressing sterile compressive bandage applied.  Patient tolerated procedure well.  POSTOPERATIVE PLAN: Patient discharged to home.  See him back on the office in 2 days.  Wound check.  Place him back in her finger dressing continue on the oral antibiotics.  Follow him closely.  Given the infection the patient may require repeat I&D depending on how he heals.  Radiographs of the finger at the first visit.

## 2020-05-07 NOTE — Anesthesia Preprocedure Evaluation (Addendum)
Anesthesia Evaluation  Patient identified by MRN, date of birth, ID band Patient awake    Reviewed: Allergy & Precautions, NPO status , Patient's Chart, lab work & pertinent test results  Airway Mallampati: II  TM Distance: >3 FB Neck ROM: Full    Dental no notable dental hx. (+) Edentulous Upper, Edentulous Lower, Dental Advisory Given   Pulmonary COPD, former smoker,    Pulmonary exam normal breath sounds clear to auscultation       Cardiovascular hypertension, Pt. on medications + CAD, + CABG and + Peripheral Vascular Disease (thoracic aortic aneurysm repair 2017)  Normal cardiovascular exam Rhythm:Regular Rate:Normal  TTE 09/2015 Study Conclusions  - Left ventricle: The cavity size was normal. Wall thickness was normal. Systolic function was normal. The estimated ejection fraction was in the range of 50% to 55%.  - Left atrium: The atrium was severely dilated. - Right atrium: The atrium was moderately dilated. - Pulmonary arteries: Systolic pressure was mildly increased. PA peak pressure: 37 mm Hg (S).  LHC 09/2105 prior to CABG Dominance: Right. Mild aortic root calcification and diffuse coronary calcification is evident.   Left Main     Ost LM lesion, 60% stenosed. The lesion is type C Calcified.    LM lesion, 80% stenosed. The lesion is type C Diffuse. Cauliflower-like lesion in the distal LM    Left Anterior Descending     Prox LAD to Mid LAD lesion, 95% stenosed. The lesion is type C Calcified. Cauliflower-like calcific stenosis.     Left Circumflex     Ost Cx lesion, 90% stenosed. The lesion is type C Calcified.    Prox Cx to Mid Cx lesion, 95% stenosed. The lesion is type C Calcified.    Right Coronary Artery     Prox RCA lesion, 70% stenosed. Calcified.    Prox RCA to Mid RCA lesion, 70% stenosed. The lesion is type C Calcified. 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 Large PDA and PL branches.   LV gram: Normal LV systolic function, no significant mitral regurgitation. Mitral valve annulus normal.  Left subclavian artery/LIMA: Widely patent. Right subclavian artery shows a midsegment large calcific cauliflower-like [place] stenosis of 70-80% or more. There was a 60 mm pressure difference between the right and left arm. However the right subclavian artery itself was patent with antegrade injection. 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   Neuro/Psych Anxiety Depression negative neurological ROS     GI/Hepatic Neg liver ROS, GERD  ,  Endo/Other  negative endocrine ROSdiabetes, Type 2  Renal/GU Renal disease (kidney stones)  negative genitourinary   Musculoskeletal  (+) Arthritis ,   Abdominal Normal abdominal exam  (+)   Peds  Hematology  (+) anemia ,   Anesthesia Other Findings Possible dislocated left shoulder Gangrenous right long finger  Reproductive/Obstetrics                           Anesthesia Physical  Anesthesia Plan  ASA: III  Anesthesia Plan: MAC and Regional   Post-op Pain Management:    Induction:   PONV Risk Score and Plan: 1 and Propofol infusion, TIVA, Treatment may vary due to age or medical condition and Ondansetron  Airway Management Planned: Natural Airway and Simple Face Mask  Additional Equipment:   Intra-op Plan:   Post-operative Plan:   Informed Consent: I have reviewed the patients  History and Physical, chart, labs and discussed the procedure including the risks, benefits and alternatives for the proposed anesthesia with the patient or authorized representative who has indicated his/her understanding and acceptance.     Dental advisory given  Plan Discussed with: CRNA, Anesthesiologist and Surgeon  Anesthesia Plan Comments: (Hyperkalemia 5.0. Will treat with 1/2 amp D50 and 5 units insulin. Patient has  chronic hyponatremia and occasional hyperkalemia. Plan for block and sedation. GA/LMA as backup plan. Norton Blizzard, MD  )       Anesthesia Quick Evaluation

## 2020-05-07 NOTE — Anesthesia Procedure Notes (Signed)
Anesthesia Regional Block: Supraclavicular block   Pre-Anesthetic Checklist: ,, timeout performed, Correct Patient, Correct Site, Correct Laterality, Correct Procedure, Correct Position, site marked, Risks and benefits discussed,  Surgical consent,  Pre-op evaluation,  At surgeon's request and post-op pain management  Laterality: Right  Prep: Dura Prep       Needles:  Injection technique: Single-shot  Needle Type: Echogenic Stimulator Needle     Needle Length: 10cm  Needle Gauge: 20     Additional Needles:   Procedures:,,,, ultrasound used (permanent image in chart),,,,  Narrative:  Start time: 05/07/2020 2:30 PM End time: 05/07/2020 2:35 PM Injection made incrementally with aspirations every 5 mL.  Performed by: Personally  Anesthesiologist: Merlinda Frederick, MD  Additional Notes: Functioning IV was confirmed and monitors applied. Sterile prep and drape,hand hygiene and sterile gloves were used.Ultrasound guidance: relevant anatomy identified, needle position confirmed, local anesthetic spread visualized around nerve(s)., vascular puncture avoided.  Image printed for medical record.  Negative aspiration and negative test dose prior to incremental administration of local anesthetic. The patient tolerated the procedure well.

## 2020-05-07 NOTE — Progress Notes (Signed)
Orthopedic Tech Progress Note Patient Details:  [name] [name] [birth year] [id number] Left arm sling at bedside. Ortho Devices Type of Ortho Device: Arm sling Ortho Device/Splint Interventions: Ordered   Post Interventions Patient Tolerated: Other (comment) (Left at bedside)   Petra Kuba 05/07/2020, 6:55 PM

## 2020-05-07 NOTE — Transfer of Care (Signed)
Immediate Anesthesia Transfer of Care Note  Patient: [name]  Procedure(s) Performed: RIGHT LONG FINGER IRRIGATION AND DEBRIDEMENT AND AMPUTATION (Right Finger)  Patient Location: PACU  Anesthesia Type:MAC combined with regional for post-op pain  Level of Consciousness: awake, alert  and oriented  Airway & Oxygen Therapy: Patient Spontanous Breathing  Post-op Assessment: Report given to RN and Post -op Vital signs reviewed and stable  Post vital signs: Reviewed and stable  Last Vitals:  Vitals Value Taken Time  BP    Temp    Pulse    Resp    SpO2      Last Pain:  Vitals:   05/07/20 1624  TempSrc:   PainSc: (P) 0-No pain         Complications: No complications documented.

## 2020-05-07 NOTE — Anesthesia Procedure Notes (Signed)
Procedure Name: MAC Date/Time: 05/07/2020 3:45 PM Performed by: Eligha Bridegroom, CRNA Pre-anesthesia Checklist: Patient identified, Emergency Drugs available, Suction available, Patient being monitored and Timeout performed Patient Re-evaluated:Patient Re-evaluated prior to induction Oxygen Delivery Method: Nasal cannula Induction Type: IV induction

## 2020-05-08 ENCOUNTER — Encounter (HOSPITAL_COMMUNITY): Payer: Self-pay | Admitting: Orthopedic Surgery

## 2020-05-08 ENCOUNTER — Other Ambulatory Visit: Payer: Self-pay | Admitting: Physician Assistant

## 2020-05-08 NOTE — Anesthesia Postprocedure Evaluation (Signed)
Anesthesia Post Note  Patient: [name]  Procedure(s) Performed: RIGHT LONG FINGER IRRIGATION AND DEBRIDEMENT AND AMPUTATION (Right Finger)     Patient location during evaluation: PACU Anesthesia Type: Regional and MAC Level of consciousness: awake and alert Pain management: pain level controlled Vital Signs Assessment: post-procedure vital signs reviewed and stable Respiratory status: spontaneous breathing, nonlabored ventilation and respiratory function stable Cardiovascular status: stable and blood pressure returned to baseline Postop Assessment: no apparent nausea or vomiting Anesthetic complications: no   No complications documented.  Last Vitals:  Vitals:   05/07/20 1709 05/07/20 1720  BP: (!) 125/53 (!) 138/52  Pulse: 69 74  Resp: 15 16  Temp:  36.7 C  SpO2: 100% 100%    Last Pain:  Vitals:   05/07/20 1720  TempSrc:   PainSc: 0-No pain                 Merlinda Frederick

## 2020-05-09 ENCOUNTER — Other Ambulatory Visit: Payer: Self-pay

## 2020-05-09 ENCOUNTER — Encounter (HOSPITAL_COMMUNITY): Payer: Self-pay | Admitting: Orthopedic Surgery

## 2020-05-09 NOTE — Progress Notes (Signed)
[name] denies chest pain or shortness of breath. Patient does not have anyone to drive him to have Covid test tomorrow, he will come in 3 hours prior to surgery to be tested. [name] has type II diabetes.  Patient just found his CBG machine, but has not checked CBG.  [name] stated that A1C runs 6.5.  I instructed patient to not take Januvia the morning of surgery. I instructed patient to check CBG after awaking and every 2 hours until arrival  to the hospital.  I Instructed patient if CBG is less than 70 to drink 1/2 cup of a clear juice. Recheck CBG in 15 minutes then call pre- op desk at [phone] for further instructions.

## 2020-05-12 ENCOUNTER — Encounter (HOSPITAL_COMMUNITY): Payer: Self-pay | Admitting: Orthopedic Surgery

## 2020-05-12 ENCOUNTER — Ambulatory Visit (HOSPITAL_COMMUNITY): Payer: Medicare Other | Admitting: Certified Registered Nurse Anesthetist

## 2020-05-12 ENCOUNTER — Observation Stay (HOSPITAL_COMMUNITY)
Admission: RE | Admit: 2020-05-12 | Discharge: 2020-05-13 | Disposition: A | Discharge status: Home / Self Care | Payer: Medicare Other | Attending: Orthopedic Surgery | Admitting: Orthopedic Surgery

## 2020-05-12 ENCOUNTER — Other Ambulatory Visit: Payer: Self-pay

## 2020-05-12 ENCOUNTER — Encounter (HOSPITAL_COMMUNITY)
Admission: RE | Disposition: A | Discharge status: Home / Self Care | Payer: Self-pay | Source: Home / Self Care | Attending: Orthopedic Surgery

## 2020-05-12 DIAGNOSIS — J449 Chronic obstructive pulmonary disease, unspecified: Secondary | ICD-10-CM | POA: Diagnosis not present

## 2020-05-12 DIAGNOSIS — L8990 Pressure ulcer of unspecified site, unspecified stage: Secondary | ICD-10-CM | POA: Insufficient documentation

## 2020-05-12 DIAGNOSIS — M79644 Pain in right finger(s): Secondary | ICD-10-CM | POA: Diagnosis present

## 2020-05-12 DIAGNOSIS — E1151 Type 2 diabetes mellitus with diabetic peripheral angiopathy without gangrene: Secondary | ICD-10-CM

## 2020-05-12 DIAGNOSIS — Z7984 Long term (current) use of oral hypoglycemic drugs: Secondary | ICD-10-CM | POA: Insufficient documentation

## 2020-05-12 DIAGNOSIS — Z20822 Contact with and (suspected) exposure to covid-19: Secondary | ICD-10-CM | POA: Insufficient documentation

## 2020-05-12 DIAGNOSIS — I96 Gangrene, not elsewhere classified: Secondary | ICD-10-CM | POA: Diagnosis not present

## 2020-05-12 DIAGNOSIS — E119 Type 2 diabetes mellitus without complications: Secondary | ICD-10-CM | POA: Insufficient documentation

## 2020-05-12 DIAGNOSIS — Z7982 Long term (current) use of aspirin: Secondary | ICD-10-CM | POA: Diagnosis not present

## 2020-05-12 DIAGNOSIS — I119 Hypertensive heart disease without heart failure: Secondary | ICD-10-CM | POA: Diagnosis not present

## 2020-05-12 DIAGNOSIS — I2510 Atherosclerotic heart disease of native coronary artery without angina pectoris: Secondary | ICD-10-CM | POA: Diagnosis not present

## 2020-05-12 DIAGNOSIS — Z87891 Personal history of nicotine dependence: Secondary | ICD-10-CM | POA: Diagnosis not present

## 2020-05-12 DIAGNOSIS — Z79899 Other long term (current) drug therapy: Secondary | ICD-10-CM | POA: Insufficient documentation

## 2020-05-12 DIAGNOSIS — S68112A Complete traumatic metacarpophalangeal amputation of right middle finger, initial encounter: Secondary | ICD-10-CM | POA: Diagnosis present

## 2020-05-12 HISTORY — DX: Personal history of other specified conditions: Z87.898

## 2020-05-12 HISTORY — PX: INCISION AND DRAINAGE OF WOUND: SHX1803

## 2020-05-12 HISTORY — DX: Restless legs syndrome: G25.81

## 2020-05-12 HISTORY — DX: Acute myocardial infarction, unspecified: I21.9

## 2020-05-12 LAB — GLUCOSE, CAPILLARY
Glucose-Capillary: 135 mg/dL — ABNORMAL HIGH (ref 70–99)
Glucose-Capillary: 135 mg/dL — ABNORMAL HIGH (ref 70–99)
Glucose-Capillary: 148 mg/dL — ABNORMAL HIGH (ref 70–99)
Glucose-Capillary: 121 mg/dL — ABNORMAL HIGH (ref 70–99)
Glucose-Capillary: 180 mg/dL — ABNORMAL HIGH (ref 70–99)

## 2020-05-12 LAB — HEMOGLOBIN A1C
Hgb A1c MFr Bld: 7.2 % — ABNORMAL HIGH (ref 4.8–5.6)
Mean Plasma Glucose: 159.94 mg/dL

## 2020-05-12 LAB — SURGICAL PCR SCREEN
MRSA, PCR: POSITIVE — AB
Staphylococcus aureus: POSITIVE — AB

## 2020-05-12 LAB — RESPIRATORY PANEL BY RT PCR (FLU A&B, COVID)
Influenza A by PCR: NEGATIVE
Influenza B by PCR: NEGATIVE
SARS Coronavirus 2 by RT PCR: NEGATIVE

## 2020-05-12 SURGERY — IRRIGATION AND DEBRIDEMENT WOUND
Anesthesia: Monitor Anesthesia Care | Site: Hand | Laterality: Right

## 2020-05-12 MED ORDER — ASPIRIN 325 MG PO TABS
325.0000 mg | ORAL_TABLET | Freq: Two times a day (BID) | ORAL | Status: DC
  Administered 2020-05-12 – 2020-05-13 (×2): 325 mg via ORAL
  Filled 2020-05-12 (×2): qty 1

## 2020-05-12 MED ORDER — FENTANYL CITRATE (PF) 100 MCG/2ML IJ SOLN: 50.0000 ug | Freq: Once | INTRAMUSCULAR | Status: AC

## 2020-05-12 MED ORDER — LIDOCAINE 2% (20 MG/ML) 5 ML SYRINGE
INTRAMUSCULAR | Status: DC | PRN
  Administered 2020-05-12: 40 mg via INTRAVENOUS

## 2020-05-12 MED ORDER — MIDAZOLAM HCL 2 MG/2ML IJ SOLN: 1.0000 mg | Freq: Once | INTRAMUSCULAR | Status: AC

## 2020-05-12 MED ORDER — ONDANSETRON HCL 4 MG PO TABS: 4.0000 mg | ORAL_TABLET | Freq: Four times a day (QID) | ORAL | Status: DC | PRN

## 2020-05-12 MED ORDER — LACTATED RINGERS IV SOLN: INTRAVENOUS | Status: DC

## 2020-05-12 MED ORDER — VANCOMYCIN HCL 1250 MG/250ML IV SOLN
1250.0000 mg | Freq: Two times a day (BID) | INTRAVENOUS | Status: DC
  Administered 2020-05-12: 1250 mg via INTRAVENOUS
  Filled 2020-05-12 (×2): qty 250

## 2020-05-12 MED ORDER — PROPOFOL 500 MG/50ML IV EMUL
INTRAVENOUS | Status: DC | PRN
  Administered 2020-05-12: 100 ug/kg/min via INTRAVENOUS

## 2020-05-12 MED ORDER — BUPIVACAINE HCL (PF) 0.5 % IJ SOLN
INTRAMUSCULAR | Status: AC
  Filled 2020-05-12: qty 30

## 2020-05-12 MED ORDER — ALPRAZOLAM 0.5 MG PO TABS
0.5000 mg | ORAL_TABLET | Freq: Four times a day (QID) | ORAL | Status: DC | PRN
  Administered 2020-05-12 – 2020-05-13 (×2): 0.5 mg via ORAL
  Filled 2020-05-12 (×2): qty 1

## 2020-05-12 MED ORDER — HYDROCODONE-ACETAMINOPHEN 5-325 MG PO TABS: 1.0000 | ORAL_TABLET | ORAL | Status: DC | PRN

## 2020-05-12 MED ORDER — CHLORPROMAZINE HCL 25 MG PO TABS
25.0000 mg | ORAL_TABLET | Freq: Three times a day (TID) | ORAL | Status: DC | PRN
  Filled 2020-05-12: qty 1

## 2020-05-12 MED ORDER — POLYETHYLENE GLYCOL 3350 17 G PO PACK: 17.0000 g | PACK | Freq: Every day | ORAL | Status: DC | PRN

## 2020-05-12 MED ORDER — METHOCARBAMOL 500 MG PO TABS
500.0000 mg | ORAL_TABLET | Freq: Four times a day (QID) | ORAL | Status: DC | PRN
  Administered 2020-05-12 – 2020-05-13 (×2): 500 mg via ORAL
  Filled 2020-05-12 (×2): qty 1

## 2020-05-12 MED ORDER — FENTANYL CITRATE (PF) 100 MCG/2ML IJ SOLN
INTRAMUSCULAR | Status: AC
  Administered 2020-05-12: 50 ug via INTRAVENOUS
  Filled 2020-05-12: qty 2

## 2020-05-12 MED ORDER — ORAL CARE MOUTH RINSE: 15.0000 mL | Freq: Once | OROMUCOSAL | Status: AC

## 2020-05-12 MED ORDER — PHENYLEPHRINE 40 MCG/ML (10ML) SYRINGE FOR IV PUSH (FOR BLOOD PRESSURE SUPPORT)
PREFILLED_SYRINGE | INTRAVENOUS | Status: DC | PRN
  Administered 2020-05-12 (×2): 80 ug via INTRAVENOUS

## 2020-05-12 MED ORDER — ACETAMINOPHEN 325 MG PO TABS: 325.0000 mg | ORAL_TABLET | Freq: Four times a day (QID) | ORAL | Status: DC | PRN

## 2020-05-12 MED ORDER — 0.9 % SODIUM CHLORIDE (POUR BTL) OPTIME
TOPICAL | Status: DC | PRN
  Administered 2020-05-12: 1000 mL

## 2020-05-12 MED ORDER — HYDROCODONE-ACETAMINOPHEN 7.5-325 MG PO TABS
1.0000 | ORAL_TABLET | ORAL | Status: DC | PRN
  Administered 2020-05-12 (×2): 2 via ORAL
  Filled 2020-05-12 (×2): qty 2

## 2020-05-12 MED ORDER — CHLORHEXIDINE GLUCONATE CLOTH 2 % EX PADS: 6.0000 | MEDICATED_PAD | Freq: Every day | CUTANEOUS | Status: DC

## 2020-05-12 MED ORDER — MORPHINE SULFATE (PF) 2 MG/ML IV SOLN
0.5000 mg | INTRAVENOUS | Status: DC | PRN
  Administered 2020-05-12 (×2): 1 mg via INTRAVENOUS
  Filled 2020-05-12 (×2): qty 1

## 2020-05-12 MED ORDER — ACETAMINOPHEN 500 MG PO TABS
500.0000 mg | ORAL_TABLET | Freq: Four times a day (QID) | ORAL | Status: DC
  Administered 2020-05-12 – 2020-05-13 (×2): 500 mg via ORAL
  Filled 2020-05-12 (×2): qty 1

## 2020-05-12 MED ORDER — VANCOMYCIN HCL IN DEXTROSE 1-5 GM/200ML-% IV SOLN
1000.0000 mg | Freq: Once | INTRAVENOUS | Status: DC
  Filled 2020-05-12: qty 200

## 2020-05-12 MED ORDER — MIDAZOLAM HCL 2 MG/2ML IJ SOLN
INTRAMUSCULAR | Status: AC
  Administered 2020-05-12: 1 mg via INTRAVENOUS
  Filled 2020-05-12: qty 2

## 2020-05-12 MED ORDER — INSULIN ASPART 100 UNIT/ML ~~LOC~~ SOLN: 0.0000 [IU] | Freq: Every day | SUBCUTANEOUS | Status: DC

## 2020-05-12 MED ORDER — CHLORHEXIDINE GLUCONATE 0.12 % MT SOLN
15.0000 mL | Freq: Once | OROMUCOSAL | Status: AC
  Administered 2020-05-12: 15 mL via OROMUCOSAL
  Filled 2020-05-12: qty 15

## 2020-05-12 MED ORDER — MAGNESIUM CITRATE PO SOLN: 1.0000 | Freq: Once | ORAL | Status: DC | PRN

## 2020-05-12 MED ORDER — LIDOCAINE-EPINEPHRINE (PF) 1.5 %-1:200000 IJ SOLN
INTRAMUSCULAR | Status: DC | PRN
  Administered 2020-05-12: 30 mL via PERINEURAL

## 2020-05-12 MED ORDER — INSULIN ASPART 100 UNIT/ML ~~LOC~~ SOLN
0.0000 [IU] | Freq: Three times a day (TID) | SUBCUTANEOUS | Status: DC
  Administered 2020-05-13: 2 [IU] via SUBCUTANEOUS

## 2020-05-12 MED ORDER — DIPHENHYDRAMINE HCL 25 MG PO CAPS: 25.0000 mg | ORAL_CAPSULE | Freq: Four times a day (QID) | ORAL | Status: DC | PRN

## 2020-05-12 MED ORDER — MUPIROCIN 2 % EX OINT
1.0000 "application " | TOPICAL_OINTMENT | Freq: Two times a day (BID) | CUTANEOUS | Status: DC
  Administered 2020-05-12: 1 via NASAL
  Filled 2020-05-12: qty 22

## 2020-05-12 MED ORDER — SODIUM CHLORIDE 0.9 % IR SOLN
Status: DC | PRN
  Administered 2020-05-12: 1000 mL

## 2020-05-12 MED ORDER — ONDANSETRON HCL 4 MG/2ML IJ SOLN: 4.0000 mg | Freq: Four times a day (QID) | INTRAMUSCULAR | Status: DC | PRN

## 2020-05-12 MED ORDER — DOCUSATE SODIUM 100 MG PO CAPS
100.0000 mg | ORAL_CAPSULE | Freq: Two times a day (BID) | ORAL | Status: DC
  Administered 2020-05-12 – 2020-05-13 (×2): 100 mg via ORAL
  Filled 2020-05-12 (×2): qty 1

## 2020-05-12 MED ORDER — BISACODYL 5 MG PO TBEC: 5.0000 mg | DELAYED_RELEASE_TABLET | Freq: Every day | ORAL | Status: DC | PRN

## 2020-05-12 MED ORDER — ZOLPIDEM TARTRATE 5 MG PO TABS: 5.0000 mg | ORAL_TABLET | Freq: Every evening | ORAL | Status: DC | PRN

## 2020-05-12 MED ORDER — BUPIVACAINE HCL (PF) 0.25 % IJ SOLN
INTRAMUSCULAR | Status: AC
  Filled 2020-05-12: qty 30

## 2020-05-12 MED ORDER — METHOCARBAMOL 1000 MG/10ML IJ SOLN
500.0000 mg | Freq: Four times a day (QID) | INTRAVENOUS | Status: DC | PRN
  Filled 2020-05-12: qty 5

## 2020-05-12 MED ORDER — VANCOMYCIN HCL IN DEXTROSE 1-5 GM/200ML-% IV SOLN
1000.0000 mg | INTRAVENOUS | Status: AC
  Administered 2020-05-12: 1000 mg via INTRAVENOUS

## 2020-05-12 MED ORDER — FAMOTIDINE 20 MG PO TABS: 20.0000 mg | ORAL_TABLET | Freq: Two times a day (BID) | ORAL | Status: DC | PRN

## 2020-05-12 SURGICAL SUPPLY — 34 items
BNDG COHESIVE 1X5 TAN STRL LF (GAUZE/BANDAGES/DRESSINGS) ×3 IMPLANT
BNDG ELASTIC 4X5.8 VLCR STR LF (GAUZE/BANDAGES/DRESSINGS) IMPLANT
BNDG GAUZE ELAST 4 BULKY (GAUZE/BANDAGES/DRESSINGS) ×3 IMPLANT
BNDG STRETCH 4X75 NS LF (GAUZE/BANDAGES/DRESSINGS) ×3 IMPLANT
CORD BIPOLAR FORCEPS 12FT (ELECTRODE) ×3 IMPLANT
CUFF TOURN SGL QUICK 24 (TOURNIQUET CUFF) ×2
CUFF TRNQT CYL 24X4X16.5-23 (TOURNIQUET CUFF) ×1 IMPLANT
DRAPE SURG 17X11 SM STRL (DRAPES) ×6 IMPLANT
DRSG EMULSION OIL 3X3 NADH (GAUZE/BANDAGES/DRESSINGS) ×3 IMPLANT
DRSG PAD ABDOMINAL 8X10 ST (GAUZE/BANDAGES/DRESSINGS) IMPLANT
ELECT REM PT RETURN 9FT ADLT (ELECTROSURGICAL) ×3
ELECTRODE REM PT RTRN 9FT ADLT (ELECTROSURGICAL) ×1 IMPLANT
GAUZE SPONGE 4X4 12PLY STRL (GAUZE/BANDAGES/DRESSINGS) ×3 IMPLANT
GLOVE SURG ORTHO 8.0 STRL STRW (GLOVE) ×3 IMPLANT
GOWN STRL REUS W/ TWL XL LVL3 (GOWN DISPOSABLE) ×1 IMPLANT
GOWN STRL REUS W/TWL XL LVL3 (GOWN DISPOSABLE) ×2
KIT BASIN OR (CUSTOM PROCEDURE TRAY) ×3 IMPLANT
MANIFOLD NEPTUNE II (INSTRUMENTS) ×3 IMPLANT
NS IRRIG 1000ML POUR BTL (IV SOLUTION) ×3 IMPLANT
PACK ORTHO EXTREMITY (CUSTOM PROCEDURE TRAY) ×3 IMPLANT
PAD CAST 4YDX4 CTTN HI CHSV (CAST SUPPLIES) IMPLANT
PADDING CAST COTTON 4X4 STRL (CAST SUPPLIES)
SET CYSTO W/LG BORE CLAMP LF (SET/KITS/TRAYS/PACK) ×3 IMPLANT
SOAP 2 % CHG 4 OZ (WOUND CARE) ×3 IMPLANT
SUT PROLENE 3 0 PS 2 (SUTURE) IMPLANT
SUT PROLENE 4 0 PS 2 18 (SUTURE) ×6 IMPLANT
SUT VIC AB 1 CT1 27 (SUTURE)
SUT VIC AB 1 CT1 27XBRD ANTBC (SUTURE) IMPLANT
SUT VIC AB 2-0 CT1 27 (SUTURE)
SUT VIC AB 2-0 CT1 27XBRD (SUTURE) IMPLANT
SYR 20ML LL LF (SYRINGE) ×3 IMPLANT
SYR BULB IRRIG 60ML STRL (SYRINGE) ×3 IMPLANT
SYR CONTROL 10ML LL (SYRINGE) ×3 IMPLANT
TOWEL GREEN STERILE (TOWEL DISPOSABLE) ×3 IMPLANT

## 2020-05-12 NOTE — Anesthesia Postprocedure Evaluation (Signed)
Anesthesia Post Note  Patient: [name]  Procedure(s) Performed: IRRIGATION AND DEBRIDEMENT WOUND and possible revision amputation (Right Hand)     Patient location during evaluation: PACU Anesthesia Type: Regional and MAC Level of consciousness: awake and alert Pain management: pain level controlled Vital Signs Assessment: post-procedure vital signs reviewed and stable Respiratory status: spontaneous breathing, nonlabored ventilation and respiratory function stable Cardiovascular status: stable and blood pressure returned to baseline Postop Assessment: no apparent nausea or vomiting Anesthetic complications: no   No complications documented.  Last Vitals:  Vitals:   05/12/20 1525 05/12/20 1555  BP: 121/63 117/68  Pulse: 76 80  Resp: 12 13  Temp: 36.9 C   SpO2: 100% 99%    Last Pain:  Vitals:   05/12/20 1525  TempSrc:   PainSc: Asleep                 [name],W. EDMOND

## 2020-05-12 NOTE — Transfer of Care (Signed)
Immediate Anesthesia Transfer of Care Note  Patient: [name]  Procedure(s) Performed: IRRIGATION AND DEBRIDEMENT WOUND and possible revision amputation (Right Hand)  Patient Location: PACU  Anesthesia Type:MAC combined with regional for post-op pain  Level of Consciousness: awake, alert  and oriented  Airway & Oxygen Therapy: Patient Spontanous Breathing and Patient connected to nasal cannula oxygen  Post-op Assessment: Report given to RN, Post -op Vital signs reviewed and stable and Patient moving all extremities  Post vital signs: Reviewed and stable  Last Vitals:  Vitals Value Taken Time  BP 137/53 05/12/20 1424  Temp    Pulse 78 05/12/20 1424  Resp 16 05/12/20 1424  SpO2 100 % 05/12/20 1424  Vitals shown include unvalidated device data.  Last Pain:  Vitals:   05/12/20 1330  TempSrc:   PainSc: 4       Patients Stated Pain Goal: 2 (86/76/19 5093)  Complications: No complications documented.

## 2020-05-12 NOTE — Progress Notes (Signed)
Pharmacy Antibiotic Note  [name] is a 74 y.o. male admitted on 05/12/2020 with finger wound.  Pharmacy has been consulted for Vancomycin dosing.  S/p I + D today  Plan: Vancomycin 1250 mg iv Q 12 hours starting tonight Likely home 9/28  Height: 6' (182.9 cm) Weight: 94.8 kg (208 lb 15.9 oz) IBW/kg (Calculated) : 77.6  Temp (24hrs), Avg:98.2 F (36.8 C), Min:97.6 F (36.4 C), Max:98.5 F (36.9 C)  Recent Labs  Lab 05/07/20 1309 05/07/20 1444  CREATININE 0.80 0.60*    Estimated Creatinine Clearance: 96.8 mL/min (A) (by C-G formula based on SCr of 0.6 mg/dL (L)).    Allergies  Allergen Reactions  . Ivp Dye [Iodinated Diagnostic Agents]     Blood Pressure dropped----pt was pre-medicated with 13 hour prep and did fine with pre-meds--amy 03/09/11   . Penicillins Nausea Only    REACTION: stomach pain(IV ok) Has patient had a PCN reaction causing immediate rash, facial/tongue/throat swelling, SOB or lightheadedness with hypotension: No Has patient had a PCN reaction causing severe rash involving mucus membranes or skin necrosis: No Has patient had a PCN reaction that required hospitalization: No Has patient had a PCN reaction occurring within the last 10 years: No If all of the above answers are "NO", then may proceed with Cephalosporin use.   . Sulfa Antibiotics Hives  . Sulfonamide Derivatives Hives and Itching  . Lisinopril Cough  . Metoprolol Nausea And Vomiting  . Statins Other (See Comments)    myalgias  . Propoxyphene N-Acetaminophen Other (See Comments)    Sharp pains- headache    Thank you Anette Guarneri, PharmD 05/12/2020 4:59 PM

## 2020-05-12 NOTE — Anesthesia Preprocedure Evaluation (Addendum)
Anesthesia Evaluation  Patient identified by MRN, date of birth, ID band Patient awake    Reviewed: Allergy & Precautions, H&P , NPO status , Patient's Chart, lab work & pertinent test results, reviewed documented beta blocker date and time   Airway Mallampati: II  TM Distance: >3 FB Neck ROM: Full    Dental no notable dental hx. (+) Edentulous Upper, Edentulous Lower, Dental Advisory Given   Pulmonary COPD,  COPD inhaler, former smoker,    Pulmonary exam normal breath sounds clear to auscultation       Cardiovascular hypertension, Pt. on medications and Pt. on home beta blockers + CAD, + Past MI, + CABG, + Peripheral Vascular Disease and +CHF   Rhythm:Regular Rate:Normal     Neuro/Psych Anxiety Depression negative neurological ROS     GI/Hepatic Neg liver ROS, GERD  Medicated,  Endo/Other  diabetes, Type 2, Oral Hypoglycemic Agents  Renal/GU negative Renal ROS  negative genitourinary   Musculoskeletal   Abdominal   Peds  Hematology  (+) Blood dyscrasia, anemia ,   Anesthesia Other Findings   Reproductive/Obstetrics negative OB ROS                            Anesthesia Physical Anesthesia Plan  ASA: III  Anesthesia Plan: MAC and Regional   Post-op Pain Management:    Induction: Intravenous  PONV Risk Score and Plan: 1 and Propofol infusion  Airway Management Planned: Simple Face Mask  Additional Equipment:   Intra-op Plan:   Post-operative Plan:   Informed Consent: I have reviewed the patients History and Physical, chart, labs and discussed the procedure including the risks, benefits and alternatives for the proposed anesthesia with the patient or authorized representative who has indicated his/her understanding and acceptance.     Dental advisory given  Plan Discussed with: CRNA  Anesthesia Plan Comments:         Anesthesia Quick Evaluation

## 2020-05-12 NOTE — Op Note (Signed)
PREOPERATIVE DIAGNOSIS: Right long finger gangrene  POSTOPERATIVE DIAGNOSIS: Same  ATTENDING SURGEON: Dr. Iran Planas who scrubbed and present for the entire procedure  ASSISTANT SURGEON: Gertie Fey, [facility] was scrubbed and necessary for revision amputation closure and dressing application in a timely fashion  ANESTHESIA: Regional with IV sedation  OPERATIVE PROCEDURE: Right long finger revision amputation with primary closure Right long finger irrigation debridement of flexor sheath  IMPLANTS: None  RADIOGRAPHIC INTERPRETATION: None  SURGICAL INDICATIONS: Patient is a right-hand-dominant gentleman who previously undergone amputation for infection and distal gangrene.  Patient was scheduled to undergo the above procedure.  Risk benefits and alternatives discussed in detail with the patient and signed informed consent was obtained.  The risks include but not limited to bleeding infection damage nearby nerves arteries or tendons loss of motion of the wrist and digits incomplete relief of symptoms and need for further surgical intervention.  SURGICAL TECHNIQUE: Patient was palpated via the preoperative holding area marked apart a marker made on the right long finger to indicate correct operative site.  Patient brought back operating placed supine on the anesthesia table where the regional anesthetic had been administered.  Patient tolerated this well.  Well-padded tourniquet placed on the right forearm and seal with the appropriate drape.  The right upper extremities then prepped and draped normal sterile fashion.  A timeout was called the correct site was then fine procedure then begun.  Attention then turned to the right long finger the previous sutures were then removed.  Copious irrigation and drainage was then done of the flexor sheath to the long finger.  The FDS had very little continuity to the remaining portion of the middle phalanx.  After flexor sheath drainage and debridement of the  skin subcutaneous tissue completion amputation was then carried out based on the lack of continuity of the FDS resection was then done of the middle phalanx.  Bony debridement was then carried out removing the middle phalanx to the level of the PIP joint.  Thorough wound irrigation done.  Once this was done skin flaps were then trimmed to taken back to healthy appearing skin.  Skin was then closed using simple Prolene sutures.  Adaptic dressing and a sterile compressive bandage then applied.  Patient tolerated the procedure well.  POSTOPERATIVE PLAN: Patient be discharged to home.  Seen back in the office in 4 days for wound check.

## 2020-05-12 NOTE — H&P (Signed)
[name] is an 74 y.o. male.   Chief Complaint: RIGHT LONG FINGER PAIN  HPI: The patient is a 74 year old right-hand dominant male who has had swelling and discomfort in the right long finger for several months.  It worsened about 2 weeks ago with increased swelling, erythema, warmth, and purulent drainage.  He was seen in the hospital on 05/02/20 and underwent an incision and drainage of the long finger. He underwent irrigation and debridement with amputation of the distal phalanx on 05/07/20. At his first post-operative visit on 05/09/20, there were still signs on infection. We discussed a return to surgery for further irrigation and debridement of the finger with possible revision amputation.  He is here today for surgery. He denies chest pain, shortness of breath, fever, chills, nausea, vomiting, diarrhea.  Past Medical History:  Diagnosis Date  . Anemia    NOS  . Anxiety   . 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 BPH (benign prostatic hypertrophy)   . Broken foot Oct. 12, 2013   Right foot Fx  . CAD (coronary artery disease)   . Carotid stenosis, left   . Cellulitis and abscess of leg 03/2018   right leg  . Chronic hyponatremia   . Chronic pain syndrome   . COPD (chronic obstructive pulmonary disease) ([place])   . Depression   . Diabetes mellitus   . Diabetic foot ulcer ([place])   . DJD (degenerative joint disease)    L wrist  . DM2 (diabetes mellitus, type 2) ([place])   . GERD (gastroesophageal reflux disease)   . HLD (hyperlipidemia)   . HTN (hypertension)   . Hx of blood transfusion reaction   . Hyponatremia   . Lumbar disc disease   . MI (mitral incompetence)   . Myocardial infarction ([place])    x 2  . Osteoporosis    tx per [facility], Dr Prudencio Burly, thought due to heavy steriod use after 1978  . Peripheral neuropathy   . Peripheral vascular disease ([place])   . Restless leg syndrome   . Spine fracture    hx, multiple  . Varicose veins   . 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 Vitamin D deficiency     Past Surgical History:  Procedure Laterality Date  . ANGIOPLASTY    . aorta bifemoral bypass grafting  09/2010  . CARDIAC CATHETERIZATION N/A 09/19/2015   Procedure: Left Heart Cath and Coronary Angiography;  Surgeon: Adrian Prows, MD;  Location: [facility];  Service: Cardiovascular;  Laterality: N/A;  . CARPAL TUNNEL RELEASE     right 2006/ left 2007  . CATARACT EXTRACTION     left 1996/ right 1997  . CORONARY ARTERY BYPASS GRAFT N/A 09/22/2015   Procedure: CORONARY ARTERY BYPASS GRAFTING (CABG) times four using the right greater saphenous vein harvested endoscopically and the left internal mammary artery.  LIMA-LAD, SEQ SVG-DIAG & OM, SVG-PD.;  Surgeon: [name], MD;  Location: [facility];  Service: Open Heart Surgery;  Laterality: N/A;  . I & D EXTREMITY Right 04/07/2018   Procedure: IRRIGATION AND DEBRIDEMENT ABSCESS RIGHT LEG;  Surgeon: Newt Minion, MD;  Location: [facility];  Service: Orthopedics;  Laterality: Right;  . INCISION AND DRAINAGE OF WOUND Right 05/07/2020   Procedure: RIGHT LONG FINGER IRRIGATION AND DEBRIDEMENT AND AMPUTATION;  Surgeon: Iran Planas, MD;  Location: [facility];  Service: Orthopedics;  Laterality: Right;  with IV sedation  .  L foot open repair jones fracture  2010    5th metetarsal   . left ankle ganglion cyst  1976  . left carotid endarterectomy  03/2011  . left cataract  1996   right - 1997  . left CTS  2006   R CTS - 2007  . left foot surgery  1998   R surgery/fracture - 1999  . left plantar ganglion cystectomy  1979  . left wrist/hand fusion  2008  . [place], 2004  . NASAL SINUS SURGERY     multiple- x8. last 1997 with obliteration  . REPAIR THORACIC AORTA  2017  . right hand fracture  1969  . ROTATOR CUFF REPAIR  2006   R, than repeat 2011, Dr Theda Sers  . TRIGGER FINGER RELEASE Left 11/11/2014   Procedure: LEFT LONG FINGER RELEASE TRIGGER FINGER/A-1 PULLEY;   Surgeon: Milly Jakob, MD;  Location: [facility];  Service: Orthopedics;  Laterality: Left;  . vocal surgery  1996    Family History  Problem Relation Age of Onset  . Lung cancer Father 59  . Diabetes Mother 3  . Osteoporosis Mother   . Depression Mother   . Bleeding Disorder Mother   . Throat cancer Other        family hx - "bad living" - also lung CA, heart disease and renal failure    . Diabetes Sister   . Suicidality Son 72  . Lung cancer Maternal Grandmother 25  . Diabetes Maternal Grandfather 20  . Heart disease Maternal Grandfather   . Heart attack Paternal Grandmother   . Brain cancer Paternal Grandfather 15   Social History:  reports that he quit smoking about 31 years ago. His smoking use included cigarettes. He has a 45.00 pack-year smoking history. He has never used smokeless tobacco. He reports previous alcohol use. He reports that he does not use drugs.  Allergies:  Allergies  Allergen Reactions  . Ivp Dye [Iodinated Diagnostic Agents]     Blood Pressure dropped----pt was pre-medicated with 13 hour prep and did fine with pre-meds--amy 03/09/11   . Penicillins Nausea Only    REACTION: stomach pain(IV ok) Has patient had a PCN reaction causing immediate rash, facial/tongue/throat swelling, SOB or lightheadedness with hypotension: No Has patient had a PCN reaction causing severe rash involving mucus membranes or skin necrosis: No Has patient had a PCN reaction that required hospitalization: No Has patient had a PCN reaction occurring within the last 10 years: No If all of the above answers are "NO", then may proceed with Cephalosporin use.   . Sulfa Antibiotics Hives  . Sulfonamide Derivatives Hives and Itching  . Lisinopril Cough  . Metoprolol Nausea And Vomiting  . Statins Other (See Comments)    myalgias  . Propoxyphene N-Acetaminophen Other (See Comments)    Sharp pains- headache    Medications Prior to Admission  Medication Sig Dispense  Refill  . albuterol (PROAIR HFA) 108 (90 Base) MCG/ACT inhaler Inhale 2 puffs into the lungs every 6 (six) hours as needed for wheezing or shortness of breath.    Marland Kitchen aspirin EC 81 MG tablet Take 81 mg by mouth daily.    Marland Kitchen BRILINTA 90 MG TABS tablet Take 90 mg by mouth 2 (two) times daily.    Marland Kitchen buPROPion (WELLBUTRIN XL) 300 MG 24 hr tablet Take 1 tablet (300 mg total) by mouth daily. 90 tablet 1  . celecoxib (CELEBREX) 200 MG capsule TAKE 1 CAPSULE(200 MG) BY  MOUTH DAILY (Patient taking differently: Take 200 mg by mouth 2 (two) times daily. ) 30 capsule 0  . Cholecalciferol (VITAMIN D3) 5000 units TABS Take 5,000 Units by mouth daily.    . clonazePAM (KLONOPIN) 0.5 MG tablet Take 1 tablet by mouth twice daily as needed for anxiety 60 tablet 1  . ENTRESTO 49-51 MG Take 1 tablet by mouth 2 (two) times daily.    . fentaNYL (DURAGESIC) 12 MCG/HR Place 1 patch onto the skin every 3 (three) days.     . furosemide (LASIX) 20 MG tablet TAKE 1 TABLET BY MOUTH TWICE DAILY (Patient taking differently: Take 20 mg by mouth 2 (two) times daily. ) 180 tablet 1  . gabapentin (NEURONTIN) 400 MG capsule 1 po q am, 1 po at lunch, 2 po qhs. (Patient taking differently: Take 400-800 mg by mouth See admin instructions. Take 400 mg by mouth in the morning and at lunch and 800 mg at bedtime) 360 capsule 1  . IRON PO Take 1 capsule by mouth daily.    . isosorbide mononitrate (IMDUR) 60 MG 24 hr tablet Take 60 mg by mouth daily.    Marland Kitchen JANUVIA 100 MG tablet TAKE 1 TABLET BY MOUTH EVERY DAY (Patient taking differently: Take 100 mg by mouth daily. ) 90 tablet 1  . Multiple Vitamin (MULTIVITAMIN WITH MINERALS) TABS tablet Take 1 tablet by mouth daily.    . nitroGLYCERIN (NITROSTAT) 0.4 MG SL tablet PLACE 1 TABLET UNDER THE TONGUE EVERY 5 MINUTES AS NEEDED FOR CHEST PAIN (Patient taking differently: Place 0.4 mg under the tongue every 5 (five) minutes as needed for chest pain. ) 25 tablet 2  . omeprazole (PRILOSEC) 40 MG capsule  Take 40 mg by mouth 2 (two) times daily.    Marland Kitchen oxyCODONE-acetaminophen (PERCOCET) 10-325 MG tablet Take 1 tablet by mouth in the morning and at bedtime.     Marland Kitchen rOPINIRole (REQUIP) 1 MG tablet TAKE 2 TABLETS(2 MG) BY MOUTH AT BEDTIME (Patient taking differently: Take 1 mg by mouth in the morning and at bedtime. ) 180 tablet 0  . tamsulosin (FLOMAX) 0.4 MG CAPS capsule Take 1 capsule (0.4 mg total) by mouth 2 (two) times daily. (Patient taking differently: Take 0.4 mg by mouth daily after supper. ) 180 capsule 0  . traZODone (DESYREL) 50 MG tablet Take 50 mg by mouth at bedtime as needed for sleep.    Marland Kitchen ACCU-CHEK FASTCLIX LANCETS MISC Use as directed to check blood glucose. Dx: E11.51 102 each 6  . amiodarone (PACERONE) 200 MG tablet Take 200 mg by mouth daily. (Patient not taking: Reported on 05/06/2020)    . Blood Glucose Monitoring Suppl (ACCU-CHEK AVIVA PLUS) w/Device KIT Use as directed to check blood glucose daily. Dx: E11.51 1 kit 0  . carvedilol (COREG) 6.25 MG tablet Take 6.25 mg by mouth 2 (two) times daily. (Patient not taking: Reported on 02/22/2020)    . clindamycin (CLEOCIN) 300 MG capsule Take 1 capsule (300 mg total) by mouth 4 (four) times daily. X 7 days (Patient not taking: Reported on 02/22/2020) 28 capsule 0  . desonide (DESOWEN) 0.05 % cream Apply 1 application topically daily as needed (rosacea).  (Patient not taking: Reported on 05/06/2020)    . diclofenac Sodium (VOLTAREN) 1 % GEL Apply 2 g topically 4 (four) times daily. (Patient not taking: Reported on 05/06/2020) 100 g 2  . glucose blood (ACCU-CHEK AVIVA PLUS) test strip 1 each by Other route as needed for other. Use as  instructed. Dx: E11.51 100 each 6  . lidocaine (LIDODERM) 5 % Place 1 patch onto the skin daily. Remove & Discard patch within 12 hours or as directed by MD (Patient not taking: Reported on 02/22/2020) 30 patch 0  . NARCAN 4 MG/0.1ML LIQD nasal spray kit Place 4 mg into the nose as needed (opioid reversal).     .  PRALUENT 75 MG/ML SOAJ Inject 75 mg into the skin every 14 (fourteen) days. (Patient not taking: Reported on 03/26/2020)    . pravastatin (PRAVACHOL) 20 MG tablet Take 20 mg by mouth at bedtime. (Patient not taking: Reported on 02/22/2020)    . senna-docusate (SENOKOT-S) 8.6-50 MG tablet Take 2 tablets by mouth 2 (two) times daily. (Patient not taking: Reported on 05/06/2020)      Results for orders placed or performed during the hospital encounter of 05/12/20 (from the past 48 hour(s))  Glucose, capillary     Status: Abnormal   Collection Time: 05/12/20 11:06 AM  Result Value Ref Range   Glucose-Capillary 135 (H) 70 - 99 mg/dL    Comment: Glucose reference range applies only to samples taken after fasting for at least 8 hours.   Comment 1 Notify RN    Comment 2 Document in Chart   Respiratory Panel by RT PCR (Flu A&B, Covid) - Nasopharyngeal Swab     Status: None   Collection Time: 05/12/20 11:14 AM   Specimen: Nasopharyngeal Swab  Result Value Ref Range   SARS Coronavirus 2 by RT PCR NEGATIVE NEGATIVE    Comment: (NOTE) SARS-CoV-2 target nucleic acids are NOT DETECTED.  The SARS-CoV-2 RNA is generally detectable in upper respiratoy specimens during the acute phase of infection. The lowest concentration of SARS-CoV-2 viral copies this assay can detect is 131 copies/mL. A negative result does not preclude SARS-Cov-2 infection and should not be used as the sole basis for treatment or other patient management decisions. A negative result may occur with  improper specimen collection/handling, submission of specimen other than nasopharyngeal swab, presence of viral mutation(s) within the areas targeted by this assay, and inadequate number of viral copies (<131 copies/mL). A negative result must be combined with clinical observations, patient history, and epidemiological information. The expected result is Negative.  Fact Sheet for Patients:   PinkCheek.be  Fact Sheet for Healthcare Providers:  GravelBags.it  This test is no t yet approved or cleared by the [place] FDA and  has been authorized for detection and/or diagnosis of SARS-CoV-2 by FDA under an Emergency Use Authorization (EUA). This EUA will remain  in effect (meaning this test can be used) for the duration of the COVID-19 declaration under Section 564(b)(1) of the Act, 21 U.S.C. section 360bbb-3(b)(1), unless the authorization is terminated or revoked sooner.     Influenza A by PCR NEGATIVE NEGATIVE   Influenza B by PCR NEGATIVE NEGATIVE    Comment: (NOTE) The Xpert Xpress SARS-CoV-2/FLU/RSV assay is intended as an aid in  the diagnosis of influenza from Nasopharyngeal swab specimens and  should not be used as a sole basis for treatment. Nasal washings and  aspirates are unacceptable for Xpert Xpress SARS-CoV-2/FLU/RSV  testing.  Fact Sheet for Patients: PinkCheek.be  Fact Sheet for Healthcare Providers: GravelBags.it  This test is not yet approved or cleared by the [place] FDA and  has been authorized for detection and/or diagnosis of SARS-CoV-2 by  FDA under an Emergency Use Authorization (EUA). This EUA will remain  in effect (meaning this test can be used)  for the duration of the  Covid-19 declaration under Section 564(b)(1) of the Act, 21  U.S.C. section 360bbb-3(b)(1), unless the authorization is  terminated or revoked. Performed at [facility], [place] [address] [place], [place] [address]    No results found.  ROS NO RECENT ILLNESSES OR HOSPITALIZATIONS  Blood pressure (!) 166/51, pulse 74, temperature 98.2 F (36.8 C), temperature source Oral, resp. rate 17, height 6' (1.829 m), weight 94.8 kg, SpO2 95 %. Physical Exam   General Appearance:  Alert, cooperative, no distress, appears stated age  Head:   Normocephalic, without obvious abnormality, atraumatic  Eyes:  Pupils equal, conjunctiva/corneas clear,         Throat: Lips, mucosa, and tongue normal; teeth and gums normal  Neck: No visible masses     Lungs:   respirations unlabored  Chest Wall:  No tenderness or deformity  Heart:  Regular rate and rhythm,  Abdomen:   Soft, non-tender,         Extremities: RIGHT LONG FINGER DRESSING IN PLACE, GOOD MOBILITY TO INDEX/RING/SMALL FINGER  Pulses: 2+ and symmetric  Skin: Skin color, texture, turgor normal, no rashes or lesions     Neurologic: Normal    Assessment/Plan RIGHT LONG FINGER INFECTION    - RIGHT LONG FINGER IRRIGATION AND DEBRIDEMENT AND POSSIBLE REVISION AMPUTATION  R/B/A DISCUSSED WITH PT IN OFFICE.  PT VOICED UNDERSTANDING OF PLAN CONSENT SIGNED DAY OF SURGERY PT SEEN AND EXAMINED PRIOR TO OPERATIVE PROCEDURE/DAY OF SURGERY SITE MARKED. QUESTIONS ANSWERED WILL GO HOME FOLLOWING SURGERY  WE ARE PLANNING SURGERY FOR YOUR UPPER EXTREMITY. THE RISKS AND BENEFITS OF SURGERY INCLUDE BUT NOT LIMITED TO BLEEDING INFECTION, DAMAGE TO NEARBY NERVES ARTERIES TENDONS, FAILURE OF SURGERY TO ACCOMPLISH ITS INTENDED GOALS, PERSISTENT SYMPTOMS AND NEED FOR FURTHER SURGICAL INTERVENTION. WITH THIS IN MIND WE WILL PROCEED. I HAVE DISCUSSED WITH THE PATIENT THE PRE AND POSTOPERATIVE REGIMEN AND THE DOS AND DON'TS. PT VOICED UNDERSTANDING AND INFORMED CONSENT SIGNED.  Brett Soza [facility]  MD 05/12/20  Brynda Peon 05/12/2020, 12:59 PM

## 2020-05-12 NOTE — Anesthesia Procedure Notes (Signed)
Anesthesia Regional Block: Supraclavicular block   Pre-Anesthetic Checklist: ,, timeout performed, Correct Patient, Correct Site, Correct Laterality, Correct Procedure, Correct Position, site marked, Risks and benefits discussed, pre-op evaluation,  At surgeon's request and post-op pain management  Laterality: Right  Prep: Maximum Sterile Barrier Precautions used, chloraprep       Needles:  Injection technique: Single-shot  Needle Type: Echogenic Stimulator Needle     Needle Length: 9cm  Needle Gauge: 21     Additional Needles:   Procedures:,,,, ultrasound used (permanent image in chart),,,,   Nerve Stimulator or Paresthesia:  Response: Biceps response,   Additional Responses:   Narrative:  Start time: 05/12/2020 1:01 PM End time: 05/12/2020 1:11 PM Injection made incrementally with aspirations every 5 mL. Anesthesiologist: Roderic Palau, MD  Additional Notes: 2% Lidocaine skin wheel.

## 2020-05-13 ENCOUNTER — Encounter (HOSPITAL_COMMUNITY): Payer: Self-pay | Admitting: Orthopedic Surgery

## 2020-05-13 DIAGNOSIS — I96 Gangrene, not elsewhere classified: Secondary | ICD-10-CM | POA: Diagnosis not present

## 2020-05-13 LAB — GLUCOSE, CAPILLARY: Glucose-Capillary: 147 mg/dL — ABNORMAL HIGH (ref 70–99)

## 2020-05-13 MED ORDER — HYDROMORPHONE HCL 1 MG/ML IJ SOLN
0.5000 mg | INTRAMUSCULAR | Status: DC | PRN
  Administered 2020-05-13 (×3): 1 mg via INTRAVENOUS
  Filled 2020-05-13 (×3): qty 1

## 2020-05-13 MED ORDER — OXYCODONE-ACETAMINOPHEN 7.5-325 MG PO TABS
1.0000 | ORAL_TABLET | ORAL | Status: DC | PRN
  Administered 2020-05-13 (×2): 2 via ORAL
  Filled 2020-05-13 (×2): qty 2

## 2020-05-13 NOTE — Progress Notes (Signed)
Pt complains of uncontrolled pain on right middle finger and his left shoulder. 2 Hydrocodone 7.5 mg given at 2258 and Morphine 1 mg given via IV at 2359 did not bring relief, at this time his pain level is 10/10. PA on call, Jenetta Loges, was paged and notified of event; she ordered to d/c the Hydrocodone 7.5 mg and Morphine IV and switched instead to Percocet 1-2 tabs every 4 hours for severe pain and IV Dilaudid 0.5-1 mg every 2 hours for breakthrough pain unrelieved by PO analgesics. Will administer to patient as soon as verified by pharmacy.

## 2020-05-13 NOTE — Evaluation (Signed)
Occupational Therapy Evaluation Patient Details Name: [name] MRN: [id number] DOB: March 10, 1946 Today's Date: 05/13/2020    History of Present Illness The patient is a 74 year old right-hand dominant male who has had swelling and discomfort in the right long finger for several months.  It worsened about 2 weeks ago with increased swelling, erythema, warmth, and purulent drainage.Right long finger revision amputation with primary closureRight long finger irrigation debridement of flexor sheath.   Clinical Impression   Patient admitted with above diagnosis.  Post long finger amputation, and mild L shoulder AC joint separation.  Ice provided.  Patient has a history of falls, and a psyche history.  He will need a little help at home for heavy home management, meal prep, and community mobility.  Patient was Mod I at home with DME, he does still drive, but has friends that look after him or an as needed basis.  He plans on discharge today with follow up as prescribed by the MD.  No further needs in the acute setting.  Advised PT there were no needs, patient is at baseline and declines any [place] services.       Follow Up Recommendations  No OT follow up    Equipment Recommendations   none   Recommendations for Other Services       Precautions / Restrictions Precautions Precautions: Fall Restrictions Weight Bearing Restrictions: No Other Position/Activity Restrictions: Mild separation to [facility] joint to L shoulder.      Mobility Bed Mobility Overal bed mobility: Modified Independent             General bed mobility comments: HOB elevated  Transfers Overall transfer level: Modified independent               General transfer comment: Baseline neuropathy and fall history.  Patient will use w/c at home most of the time.  He is not interested in [facility] PT.    Balance Overall balance assessment: Mild deficits observed, not formally tested;History of Falls                                          ADL either performed or assessed with clinical judgement   ADL Overall ADL's : At baseline                                       General ADL Comments: patient will need some additional assist with home management and meals.  He has a friend that will assist.     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     Perception     Praxis      Pertinent Vitals/Pain Pain Assessment: 0-10 Pain Score: 6  Pain Location: L shoulder and R long finger. Pain Descriptors / Indicators: Pounding;Sharp;Spasm Pain Intervention(s): Monitored during session     Hand Dominance Right   Extremity/Trunk Assessment Upper Extremity Assessment Upper Extremity Assessment: RUE deficits/detail;LUE deficits/detail RUE Deficits / Details: amputation to R long finger RUE: Unable to fully assess due to immobilization RUE Sensation: WNL RUE Coordination: decreased fine motor LUE Deficits / Details: L shoulder discomfort LUE: Unable to fully assess due to pain LUE Sensation: WNL LUE Coordination: decreased  gross motor   Lower Extremity Assessment Lower Extremity Assessment: Overall WFL for tasks assessed   Cervical / Trunk Assessment Cervical / Trunk Assessment: Normal   Communication Communication Communication: No difficulties   Cognition Arousal/Alertness: Awake/alert Behavior During Therapy: Anxious Overall Cognitive Status: Within Functional Limits for tasks assessed                                 General Comments: Past psyche history.   General Comments       Exercises  AROM to tolerance.    Shoulder Instructions      Home Living Family/patient expects to be discharged to:: Private residence Living Arrangements: Alone Available Help at Discharge: Friend(s);Available  PRN/intermittently Type of Home: House Home Access: Stairs to enter [employer] of Steps: 2 Entrance Stairs-Rails: None Home Layout: One level     Bathroom Shower/Tub: Teacher, early years/pre: Standard Bathroom Accessibility: Yes How Accessible: Accessible via walker Home Equipment: [place] - 2 wheels;Wheelchair - manual;Cane - single point          Prior Functioning/Environment Level of Independence: Independent with assistive device(s)        Comments: Mobility at times with wheelchair in the home.        OT Problem List: Impaired balance (sitting and/or standing);Increased edema;Pain      OT Treatment/Interventions:      OT Goals(Current goals can be found in the care plan section) Acute Rehab OT Goals Patient Stated Goal: I'm going home, I'll be ok. OT Goal Formulation: With patient Time For Goal Achievement: 05/13/20 Potential to Achieve Goals: Good  OT Frequency:     Barriers to D/C:  none noted          Co-evaluation              AM-PAC OT "6 Clicks" Daily Activity     Outcome Measure Help from another person eating meals?: None Help from another person taking care of personal grooming?: None Help from another person toileting, which includes using toliet, bedpan, or urinal?: None Help from another person bathing (including washing, rinsing, drying)?: A Little Help from another person to put on and taking off regular upper body clothing?: A Little Help from another person to put on and taking off regular lower body clothing?: A Little 6 Click Score: 21   End of Session Equipment Utilized During Treatment: Gait belt Nurse Communication: Other (comment) (Patient sitting in recliner)  Activity Tolerance: Patient tolerated treatment well Patient left: in chair;with call bell/phone within reach  OT Visit Diagnosis: Unsteadiness on feet (R26.81);Pain Pain - Right/Left: Left Pain - part of body: Shoulder                Time:  3790-2409 OT Time Calculation (min): 24 min Charges:  OT General Charges $OT Visit: 1 Visit OT Evaluation $OT Eval Moderate Complexity: 1 Mod  05/13/2020  Rich, OTR/L  Acute Rehabilitation Services  Office:  [phone]   Metta Clines 05/13/2020, 9:03 AM

## 2020-05-13 NOTE — Progress Notes (Signed)
PT Cancellation Note  Patient Details Name: [name] MRN: [id number] DOB: 11/13/1945   Cancelled Treatment:    Reason Eval/Treat Not Completed: PT screened, no needs identified. Discussed pt case with evaluating OT who reports pt is at baseline of function. Although pt with baseline mobility deficits, he uses a wheelchair at times and will have assist at home from friends. Pt refusing any home health follow-up at this time and told OT he is not interested specifically in [place]. It appears pt has all the necessary DME at home. If needs change, please reconsult. Will sign off at this time. If needs change, please reconsult.    Thelma Comp 05/13/2020, 10:38 AM   Rolinda Roan, PT, DPT Acute Rehabilitation Services Pager: [phone] Office: [phone]

## 2020-05-13 NOTE — Discharge Instructions (Signed)
KEEP BANDAGE CLEAN AND DRY CALL OFFICE FOR F/U APPT [phone] IN 4 DAYS KEEP HAND ELEVATED ABOVE HEART OK TO APPLY ICE TO OPERATIVE AREA CONTACT OFFICE IF ANY WORSENING PAIN OR CONCERNS.    Prescription for pain medicine and antibiotic were sent to [facility] on [facility].

## 2020-05-13 NOTE — Progress Notes (Signed)
Patient discharge teaching given, including activity, diet, follow-up appoints, and medications. Patient verbalized understanding of all discharge instructions. IV access was d/c'd. Vitals are stable. Skin is intact except as charted in most recent assessments. Pt to be escorted out by volunteers, to be driven home by family.

## 2020-05-13 NOTE — Discharge Summary (Signed)
Physician Discharge Summary  Patient ID: [name] MRN: [id number] DOB/AGE: Mar 21, 1946 74 y.o.  Admit date: 05/12/2020 Discharge date:  05/13/20  Admission Diagnoses: Right long finger infection Past Medical History:  Diagnosis Date   Anemia    NOS   Anxiety    Blindness of left eye    near blindness. 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 12, 2013   Right foot Fx   CAD (coronary artery disease)    Carotid stenosis, left    Cellulitis and abscess of leg 03/2018   right leg   Chronic hyponatremia    Chronic pain syndrome    COPD (chronic obstructive pulmonary disease) (HCC)    Depression    Diabetes mellitus    Diabetic foot ulcer (HCC)    DJD (degenerative joint disease)    L wrist   DM2 (diabetes mellitus, type 2) (HCC)    GERD (gastroesophageal reflux disease)    HLD (hyperlipidemia)    HTN (hypertension)    Hx of blood transfusion reaction    Hyponatremia    Lumbar disc disease    MI (mitral incompetence)    Myocardial infarction ([place])    x 2   Osteoporosis    tx per [facility], Dr Prudencio Burly, thought due to heavy steriod use after 1978   Peripheral neuropathy    Peripheral vascular disease ([place])    Restless leg syndrome    Spine fracture    hx, multiple   Varicose veins    Visual impairment of left eye    artery occlusion   Vitamin D deficiency     Discharge Diagnoses:  Active Problems:   Pressure injury of skin   Amputation of right middle finger   Surgeries: Procedure(s): IRRIGATION AND DEBRIDEMENT WOUND and possible revision amputation on 05/12/2020    Consultants:   Discharged Condition: Improved  Hospital Course: [name] is an 74 y.o. male who was admitted 05/12/2020 with a chief complaint of No chief complaint on file. , and found to have a diagnosis of Right long finger infection.  They were brought to the operating room on 05/12/2020 and underwent  Procedure(s): IRRIGATION AND DEBRIDEMENT WOUND and possible revision amputation.    They were given perioperative antibiotics:  Anti-infectives (From admission, onward)   Start     Dose/Rate Route Frequency Ordered Stop   05/13/20 0600  vancomycin (VANCOCIN) IVPB 1000 mg/200 mL premix        1,000 mg 200 mL/hr over 60 Minutes Intravenous On call to O.R. 05/12/20 1258 05/12/20 1751   05/13/20 0000  vancomycin (VANCOREADY) IVPB 1250 mg/250 mL        1,250 mg 166.7 mL/hr over 90 Minutes Intravenous Every 12 hours 05/12/20 1657     05/12/20 1230  vancomycin (VANCOCIN) IVPB 1000 mg/200 mL premix  Status:  Discontinued        1,000 mg 200 mL/hr over 60 Minutes Intravenous  Once 05/12/20 1226 05/12/20 1258    .  They were given sequential compression devices, early ambulation, and Other (comment) for DVT prophylaxis.  Recent vital signs:  Patient Vitals for the past 24 hrs:  BP Temp Temp src Pulse Resp SpO2 Height Weight  05/13/20 0750 135/70 98.3 F (36.8 C) Oral 91 17 99 % -- --  05/12/20 2337 -- 98.4 F (36.9 C) Oral 92 20 100 % -- --  05/12/20 2016 (!) 151/66 98.3 F (36.8 C) Oral 93 18 98 % -- --  05/12/20 1653 133/87 97.6 F (36.4 C) Oral 83 20 100 % -- --  05/12/20 1625 (!) 136/58 -- -- 74 11 100 % -- --  05/12/20 1555 117/68 -- -- 80 13 99 % -- --  05/12/20 1525 121/63 98.4 F (36.9 C) -- 76 12 100 % -- --  05/12/20 1510 (!) 100/42 -- -- 82 (!) 24 95 % -- --  05/12/20 1455 100/69 -- -- 79 13 100 % -- --  05/12/20 1440 129/64 -- -- 81 19 100 % -- --  05/12/20 1425 (!) 137/53 98.5 F (36.9 C) -- 82 20 100 % -- --  05/12/20 1325 (!) 170/38 -- -- 79 15 98 % -- --  05/12/20 1320 (!) 153/36 -- -- 78 13 98 % -- --  05/12/20 1315 (!) 168/40 -- -- 73 11 100 % -- --  05/12/20 1310 (!) 159/49 -- -- 78 14 100 % -- --  05/12/20 1106 (!) 166/51 98.2 F (36.8 C) Oral 74 17 95 % 6' (1.829 m) 94.8 kg  .  Recent laboratory studies: No results found.  Discharge Medications:    Allergies as of 05/13/2020      Reactions   Ivp Dye [iodinated Diagnostic Agents]    Blood Pressure dropped----pt was pre-medicated with 13 hour prep and did fine with pre-meds--amy 03/09/11   Penicillins Nausea Only   REACTION: stomach pain(IV ok) Has patient had a PCN reaction causing immediate rash, facial/tongue/throat swelling, SOB or lightheadedness with hypotension: No Has patient had a PCN reaction causing severe rash involving mucus membranes or skin necrosis: No Has patient had a PCN reaction that required hospitalization: No Has patient had a PCN reaction occurring within the last 10 years: No If all of the above answers are "NO", then may proceed with Cephalosporin use.   Sulfa Antibiotics Hives   Sulfonamide Derivatives Hives, Itching   Lisinopril Cough   Metoprolol Nausea And Vomiting   Statins Other (See Comments)   myalgias   Propoxyphene N-acetaminophen Other (See Comments)   Sharp pains- headache      Medication List    TAKE these medications   Accu-Chek Aviva Plus w/Device Kit Use as directed to check blood glucose daily. Dx: E11.51   Accu-Chek FastClix Lancets Misc Use as directed to check blood glucose. Dx: E11.51   amiodarone 200 MG tablet Commonly known as: PACERONE Take 200 mg by mouth daily.   aspirin EC 81 MG tablet Take 81 mg by mouth daily.   Brilinta 90 MG Tabs tablet Generic drug: ticagrelor Take 90 mg by mouth 2 (two) times daily.   buPROPion 300 MG 24 hr tablet Commonly known as: Wellbutrin XL Take 1 tablet (300 mg total) by mouth daily.   carvedilol 6.25 MG tablet Commonly known as: COREG Take 6.25 mg by mouth 2 (two) times daily.   celecoxib 200 MG capsule Commonly known as: CELEBREX TAKE 1 CAPSULE(200 MG) BY MOUTH DAILY What changed: See the new instructions.   clindamycin 300 MG capsule Commonly known as: CLEOCIN Take 1 capsule (300 mg total) by mouth 4 (four) times daily. X 7 days   clonazePAM 0.5 MG tablet Commonly known  as: KLONOPIN Take 1 tablet by mouth twice daily as needed for anxiety   desonide 0.05 % cream Commonly known as: DESOWEN Apply 1 application topically daily as needed (rosacea).   diclofenac Sodium 1 % Gel Commonly known as: Voltaren Apply 2 g topically 4 (four) times daily.   Entresto 49-51 MG Generic drug:  sacubitril-valsartan Take 1 tablet by mouth 2 (two) times daily.   fentaNYL 12 MCG/HR Commonly known as: [place] 1 patch onto the skin every 3 (three) days.   furosemide 20 MG tablet Commonly known as: LASIX TAKE 1 TABLET BY MOUTH TWICE DAILY What changed: when to take this   gabapentin 400 MG capsule Commonly known as: NEURONTIN 1 po q am, 1 po at lunch, 2 po qhs. What changed:   how much to take  how to take this  when to take this  additional instructions   glucose blood test strip Commonly known as: Accu-Chek Aviva Plus 1 each by Other route as needed for other. Use as instructed. Dx: E11.51   IRON PO Take 1 capsule by mouth daily.   isosorbide mononitrate 60 MG 24 hr tablet Commonly known as: IMDUR Take 60 mg by mouth daily.   Januvia 100 MG tablet Generic drug: sitaGLIPtin TAKE 1 TABLET BY MOUTH EVERY DAY What changed: how much to take   lidocaine 5 % Commonly known as: Lidoderm Place 1 patch onto the skin daily. Remove & Discard patch within 12 hours or as directed by MD   multivitamin with minerals Tabs tablet Take 1 tablet by mouth daily.   Narcan 4 MG/0.1ML Liqd nasal spray kit Generic drug: naloxone Place 4 mg into the nose as needed (opioid reversal).   nitroGLYCERIN 0.4 MG SL tablet Commonly known as: NITROSTAT PLACE 1 TABLET UNDER THE TONGUE EVERY 5 MINUTES AS NEEDED FOR CHEST PAIN What changed: See the new instructions.   omeprazole 40 MG capsule Commonly known as: PRILOSEC Take 40 mg by mouth 2 (two) times daily.   oxyCODONE-acetaminophen 10-325 MG tablet Commonly known as: PERCOCET Take 1 tablet by mouth in the  morning and at bedtime.   Praluent 75 MG/ML [place] Generic drug: Alirocumab Inject 75 mg into the skin every 14 (fourteen) days.   pravastatin 20 MG tablet Commonly known as: PRAVACHOL Take 20 mg by mouth at bedtime.   ProAir HFA 108 (90 Base) MCG/ACT inhaler Generic drug: albuterol Inhale 2 puffs into the lungs every 6 (six) hours as needed for wheezing or shortness of breath.   rOPINIRole 1 MG tablet Commonly known as: REQUIP TAKE 2 TABLETS(2 MG) BY MOUTH AT BEDTIME What changed: See the new instructions.   senna-docusate 8.6-50 MG tablet Commonly known as: Senokot-S Take 2 tablets by mouth 2 (two) times daily.   tamsulosin 0.4 MG Caps capsule Commonly known as: FLOMAX Take 1 capsule (0.4 mg total) by mouth 2 (two) times daily. What changed: when to take this   traZODone 50 MG tablet Commonly known as: DESYREL Take 50 mg by mouth at bedtime as needed for sleep.   Vitamin D3 125 MCG (5000 UT) Tabs Take 5,000 Units by mouth daily.       Diagnostic Studies: DG Chest Port 1 View  Result Date: 05/02/2020 CLINICAL DATA:  Questionable sepsis. EXAM: PORTABLE CHEST 1 VIEW COMPARISON:  January 17, 2020 FINDINGS: Multiple sternal wires are noted. Mild, diffuse, chronic appearing increased lung markings are seen. A stable cluster of calcified lung nodules is seen along the medial aspect of the right apex. There is no evidence of acute infiltrate, pleural effusion or pneumothorax. Radiopaque surgical clips are seen within the soft tissues of the neck on the left. The cardiac silhouette is mildly enlarged and unchanged in size. Degenerative changes seen within the bilateral shoulders and thoracic spine. IMPRESSION: Chronic appearing increased lung markings without evidence of acute  or active cardiopulmonary disease. Electronically Signed   By: Virgina Norfolk M.D.   On: 05/02/2020 15:56   DG Shoulder Left  Result Date: 05/02/2020 CLINICAL DATA:  Right shoulder pain EXAM: LEFT SHOULDER -  2+ VIEW COMPARISON:  05/17/2019 FINDINGS: Two view radiograph of the left shoulder demonstrates marked remodeling of the glenoid,, likely progressive since prior examination, but not well profiled on this examination. This is in keeping with end-stage degenerative change of the glenohumeral joint. No acute fracture or dislocation is clearly identified. There has developed mild widening of the acromioclavicular joint suggesting mild separation. There is soft tissue swelling superficial to the [facility] joint and acromion. Limited evaluation of the left apex is unremarkable IMPRESSION: Marked remodeling of the glenoid, not well profiled on this examination, most in keeping with advanced degenerative arthritis and likely progressive since prior examination. Probable left AC separation. No definite associated fracture. Soft tissue swelling superficial to the left AC joint and acromion. Electronically Signed   By: Fidela Salisbury MD   On: 05/02/2020 21:05   DG Hand Complete Right  Result Date: 05/02/2020 CLINICAL DATA:  Pain EXAM: RIGHT HAND - COMPLETE 3+ VIEW COMPARISON:  None. FINDINGS: Foreign body in the soft tissues between the first and second metacarpals. No acute fracture dislocation. No bony erosion. IMPRESSION: No acute abnormalities. Electronically Signed   By: Dorise Bullion III M.D   On: 05/02/2020 20:18    They benefited maximally from their hospital stay and there were no complications.     Disposition: Discharge disposition: 01-Home or Self Care      Discharge Instructions    Discharge patient   Complete by: As directed    Discharge disposition: 01-Home or Self Care   Discharge patient date: 05/12/2020      Follow-up Information    Iran Planas, MD In 4 days.   Specialty: Orthopedic Surgery Contact information: [address] [place] [address] [phone]                Signed: Brynda Peon 05/13/2020, 10:40 AM

## 2020-05-15 ENCOUNTER — Ambulatory Visit: Payer: Medicare Other | Admitting: Physician Assistant

## 2020-05-19 ENCOUNTER — Ambulatory Visit: Payer: No Typology Code available for payment source | Admitting: Surgery

## 2020-05-19 ENCOUNTER — Encounter (HOSPITAL_COMMUNITY): Payer: No Typology Code available for payment source

## 2020-06-05 ENCOUNTER — Other Ambulatory Visit: Payer: Self-pay | Admitting: Physician Assistant

## 2020-06-05 ENCOUNTER — Other Ambulatory Visit: Payer: Self-pay | Admitting: Orthopedic Surgery

## 2020-06-05 ENCOUNTER — Other Ambulatory Visit (HOSPITAL_COMMUNITY)
Admission: RE | Admit: 2020-06-05 | Discharge: 2020-06-05 | Disposition: A | Discharge status: Home / Self Care | Payer: Medicare Other | Source: Other Acute Inpatient Hospital | Attending: Specialist | Admitting: Specialist

## 2020-06-05 ENCOUNTER — Ambulatory Visit: Payer: Self-pay | Admitting: Orthopedic Surgery

## 2020-06-05 DIAGNOSIS — M25412 Effusion, left shoulder: Secondary | ICD-10-CM

## 2020-06-05 DIAGNOSIS — M25512 Pain in left shoulder: Secondary | ICD-10-CM | POA: Diagnosis present

## 2020-06-05 LAB — SYNOVIAL CELL COUNT + DIFF, W/ CRYSTALS
Crystals, Fluid: NONE SEEN
Monocyte-Macrophage-Synovial Fluid: 3 % — ABNORMAL LOW (ref 50–90)
Neutrophil, Synovial: 97 % — ABNORMAL HIGH (ref 0–25)
WBC, Synovial: 81500 /mm3 — ABNORMAL HIGH (ref 0–200)

## 2020-06-06 ENCOUNTER — Other Ambulatory Visit: Payer: Self-pay | Admitting: Specialist

## 2020-06-06 DIAGNOSIS — M25512 Pain in left shoulder: Secondary | ICD-10-CM

## 2020-06-11 ENCOUNTER — Ambulatory Visit (INDEPENDENT_AMBULATORY_CARE_PROVIDER_SITE_OTHER): Payer: No Typology Code available for payment source | Admitting: Physician Assistant

## 2020-06-11 ENCOUNTER — Other Ambulatory Visit: Payer: Self-pay

## 2020-06-11 ENCOUNTER — Encounter: Payer: Self-pay | Admitting: Physician Assistant

## 2020-06-11 DIAGNOSIS — F411 Generalized anxiety disorder: Secondary | ICD-10-CM | POA: Diagnosis not present

## 2020-06-11 DIAGNOSIS — F331 Major depressive disorder, recurrent, moderate: Secondary | ICD-10-CM | POA: Diagnosis not present

## 2020-06-11 DIAGNOSIS — F4310 Post-traumatic stress disorder, unspecified: Secondary | ICD-10-CM | POA: Diagnosis not present

## 2020-06-11 DIAGNOSIS — F22 Delusional disorders: Secondary | ICD-10-CM

## 2020-06-11 DIAGNOSIS — G2581 Restless legs syndrome: Secondary | ICD-10-CM | POA: Diagnosis not present

## 2020-06-11 LAB — AEROBIC/ANAEROBIC CULTURE W GRAM STAIN (SURGICAL/DEEP WOUND): Culture: NO GROWTH

## 2020-06-11 MED ORDER — ROPINIROLE HCL 1 MG PO TABS: 1.0000 mg | ORAL_TABLET | Freq: Two times a day (BID) | ORAL | 0 refills | Status: DC

## 2020-06-11 MED ORDER — CLONAZEPAM 0.5 MG PO TABS: 0.5000 mg | ORAL_TABLET | Freq: Two times a day (BID) | ORAL | 1 refills | Status: DC | PRN

## 2020-06-11 NOTE — Progress Notes (Signed)
[facility]  Patient ID: [name],  MRN: [id number]  PCP: Doreatha Lew, MD  Date of Evaluation: 06/11/2020 Time spent:30 minutes  Chief Complaint:  Chief Complaint    Anxiety; Depression; Insomnia      HISTORY/CURRENT STATUS:  Has been sick since LOV. Has fallen several times. He scraped his right middle finger d/t one of the falls and ended up having to have part of his finger removed. Now walking with a walker. Hurt his left shoulder too.   Feels like the psych meds 'help as much as they can.' Energy and motivation are low but states he couldn't even walk much at first.  Was in the hosp twice. Has a lady from [place] staying with him, working as a Actuary. A friend of his knows her and recommended her. She will probably be working for him for at least 2-3 months but maybe longer. He denies SI/HI.  Patient denies increased energy with decreased need for sleep, no increased talkativeness, no racing thoughts, no impulsivity or risky behaviors, no increased spending, no increased libido, no grandiosity, no increased irritability or anger. He doesn't mention anything today about the FBI following him. No hallucinations.   RLS is well treated. States the Klonopin continues to help that. Ropinerole does too. States the Klonopin doesn't make him dizzy or cause falls.  He's been on it for years, and we have weaned him as low as he can comfortably go in the past few years. Of course it helps with anxiety too, but he only takes it in the evening, when his 'mind won't turn off' so he can sleep.  Denies syncope, seizures, numbness, tingling, tremor, tics, unsteady gait, slurred speech, confusion. Denies muscle or joint pain, stiffness, or dystonia.  Individual Medical History/ Review of Systems: Changes? :Yes see HPI   Past medications for mental health diagnoses include:Wellbutrin XL, Risperdal, Prozac, Zoloft, Paxil, Tofranil, gabapentin, ropinirole,  Klonopin  Allergies: Ivp dye [iodinated diagnostic agents], Penicillins, Sulfa antibiotics, Sulfonamide derivatives, Lisinopril, Metoprolol, Statins, and Propoxyphene n-acetaminophen  Current Medications:  Current Outpatient Medications:  .  ACCU-CHEK FASTCLIX LANCETS MISC, Use as directed to check blood glucose. Dx: E11.51, Disp: 102 each, Rfl: 6 .  albuterol (PROAIR HFA) 108 (90 Base) MCG/ACT inhaler, Inhale 2 puffs into the lungs every 6 (six) hours as needed for wheezing or shortness of breath., Disp: , Rfl:  .  aspirin EC 81 MG tablet, Take 81 mg by mouth daily., Disp: , Rfl:  .  Blood Glucose Monitoring Suppl (ACCU-CHEK AVIVA PLUS) w/Device KIT, Use as directed to check blood glucose daily. Dx: E11.51, Disp: 1 kit, Rfl: 0 .  BRILINTA 90 MG TABS tablet, Take 90 mg by mouth 2 (two) times daily., Disp: , Rfl:  .  buPROPion (WELLBUTRIN XL) 300 MG 24 hr tablet, Take 1 tablet (300 mg total) by mouth daily., Disp: 90 tablet, Rfl: 1 .  celecoxib (CELEBREX) 200 MG capsule, TAKE 1 CAPSULE(200 MG) BY MOUTH DAILY (Patient taking differently: Take 200 mg by mouth 2 (two) times daily. ), Disp: 30 capsule, Rfl: 0 .  Cholecalciferol (VITAMIN D3) 5000 units TABS, Take 5,000 Units by mouth daily., Disp: , Rfl:  .  clonazePAM (KLONOPIN) 0.5 MG tablet, Take 1 tablet (0.5 mg total) by mouth 2 (two) times daily as needed for anxiety., Disp: 60 tablet, Rfl: 1 .  ENTRESTO 49-51 MG, Take 1 tablet by mouth 2 (two) times daily., Disp: , Rfl:  .  fentaNYL (DURAGESIC) 12 MCG/HR, Place 1  patch onto the skin every 3 (three) days. , Disp: , Rfl:  .  furosemide (LASIX) 20 MG tablet, TAKE 1 TABLET BY MOUTH TWICE DAILY (Patient taking differently: Take 20 mg by mouth 2 (two) times daily. ), Disp: 180 tablet, Rfl: 1 .  gabapentin (NEURONTIN) 400 MG capsule, 1 po q am, 1 po at lunch, 2 po qhs. (Patient taking differently: Take 400-800 mg by mouth See admin instructions. Take 400 mg by mouth in the morning and at lunch and 800 mg  at bedtime), Disp: 360 capsule, Rfl: 1 .  glucose blood (ACCU-CHEK AVIVA PLUS) test strip, 1 each by Other route as needed for other. Use as instructed. Dx: E11.51, Disp: 100 each, Rfl: 6 .  IRON PO, Take 1 capsule by mouth daily., Disp: , Rfl:  .  isosorbide mononitrate (IMDUR) 60 MG 24 hr tablet, Take 60 mg by mouth daily., Disp: , Rfl:  .  JANUVIA 100 MG tablet, TAKE 1 TABLET BY MOUTH EVERY DAY (Patient taking differently: Take 100 mg by mouth daily. ), Disp: 90 tablet, Rfl: 1 .  Multiple Vitamin (MULTIVITAMIN WITH MINERALS) TABS tablet, Take 1 tablet by mouth daily., Disp: , Rfl:  .  NARCAN 4 MG/0.1ML LIQD nasal spray kit, Place 4 mg into the nose as needed (opioid reversal). , Disp: , Rfl:  .  nitroGLYCERIN (NITROSTAT) 0.4 MG SL tablet, PLACE 1 TABLET UNDER THE TONGUE EVERY 5 MINUTES AS NEEDED FOR CHEST PAIN (Patient taking differently: Place 0.4 mg under the tongue every 5 (five) minutes as needed for chest pain. ), Disp: 25 tablet, Rfl: 2 .  omeprazole (PRILOSEC) 40 MG capsule, Take 40 mg by mouth 2 (two) times daily., Disp: , Rfl:  .  oxyCODONE-acetaminophen (PERCOCET) 10-325 MG tablet, Take 1 tablet by mouth in the morning and at bedtime. , Disp: , Rfl:  .  rOPINIRole (REQUIP) 1 MG tablet, Take 1 tablet (1 mg total) by mouth in the morning and at bedtime., Disp: 180 tablet, Rfl: 0 .  tamsulosin (FLOMAX) 0.4 MG CAPS capsule, Take 1 capsule (0.4 mg total) by mouth 2 (two) times daily. (Patient taking differently: Take 0.4 mg by mouth daily after supper. ), Disp: 180 capsule, Rfl: 0 .  amiodarone (PACERONE) 200 MG tablet, Take 200 mg by mouth daily. (Patient not taking: Reported on 05/06/2020), Disp: , Rfl:  .  carvedilol (COREG) 6.25 MG tablet, Take 6.25 mg by mouth 2 (two) times daily. (Patient not taking: Reported on 02/22/2020), Disp: , Rfl:  .  clindamycin (CLEOCIN) 300 MG capsule, Take 1 capsule (300 mg total) by mouth 4 (four) times daily. X 7 days (Patient not taking: Reported on 02/22/2020),  Disp: 28 capsule, Rfl: 0 .  desonide (DESOWEN) 0.05 % cream, Apply 1 application topically daily as needed (rosacea).  (Patient not taking: Reported on 05/06/2020), Disp: , Rfl:  .  diclofenac Sodium (VOLTAREN) 1 % GEL, Apply 2 g topically 4 (four) times daily. (Patient not taking: Reported on 05/06/2020), Disp: 100 g, Rfl: 2 .  lidocaine (LIDODERM) 5 %, Place 1 patch onto the skin daily. Remove & Discard patch within 12 hours or as directed by MD (Patient not taking: Reported on 02/22/2020), Disp: 30 patch, Rfl: 0 .  PRALUENT 75 MG/ML SOAJ, Inject 75 mg into the skin every 14 (fourteen) days. (Patient not taking: Reported on 03/26/2020), Disp: , Rfl:  .  pravastatin (PRAVACHOL) 20 MG tablet, Take 20 mg by mouth at bedtime. (Patient not taking: Reported on 02/22/2020), Disp: ,  Rfl:  .  senna-docusate (SENOKOT-S) 8.6-50 MG tablet, Take 2 tablets by mouth 2 (two) times daily. (Patient not taking: Reported on 05/06/2020), Disp: , Rfl:  Medication Side Effects: none  Family Medical/ Social History: Changes? No  MENTAL HEALTH EXAM:  There were no vitals taken for this visit.There is no height or weight on file to calculate BMI.  General Appearance: Casual, Neat and Well Groomed  Eye Contact:  Good  Speech:  Clear and Coherent and Normal Rate  Volume:  Normal  Mood:  Euthymic  Affect:  Appropriate  Thought Process:  Goal Directed and Descriptions of Associations: Intact  Orientation:  Full (Time, Place, and Person)  Thought Content: Logical   Suicidal Thoughts:  No  Homicidal Thoughts:  No  Memory:  WNL  Judgement:  Good  Insight:  Fair  Psychomotor Activity:  slow, walks with a walker  Concentration:  Concentration: Good  Recall:  Good  Fund of Knowledge: Good  Language: Good  Assets:  Desire for Improvement  ADL's:  Intact  Cognition: WNL  Prognosis:  Fair    DIAGNOSES:    ICD-10-CM   1. Generalized anxiety disorder  F41.1   2. PTSD (post-traumatic stress disorder)  F43.10   3. Major  depressive disorder, recurrent episode, moderate (HCC)  F33.1   4. RLS (restless legs syndrome)  G25.81   5. Paranoia ([place])  [place]     Receiving Psychotherapy: No    RECOMMENDATIONS:  PDMP reviewed. I provided 30 minutes of face to face time during this encounter.  Long disc about BZ use in elderly. He understands the risks of falls, dizziness, decreased cognition, but is willing to accept those risks. Since he is not living alone, I am more comfortable Rx it. It is more helpful than harmful for him. When we decreased to a lower dose in the past, he seemed to become more paranoid.  I continue to recommend an antipsychotic but he refuses to take.  Continue Wellbutrin XL 300 mg, 1 p.o. daily. Continue Klonopin 0.5 mg, 1 p.o. twice daily as needed.  He only takes 2 po qhs.  Continue gabapentin 400 mg, 1 p.o. every morning, add 1 around lunch, and 2 p.o. nightly. Continue ropinirole 1 mg, 1 p.o. bid. Return in 6 weeks.  Donnal Moat, PA-C

## 2020-06-17 ENCOUNTER — Telehealth: Payer: Self-pay

## 2020-06-17 NOTE — Telephone Encounter (Signed)
Phone call to patient to review instructions for 13 hr prep for Joint injection with contrast on 06/24/20 at 11am. Prescription called into [facility]. Pt aware and verbalized understanding of instructions. Prescription: 06/23/20 10pm- 50mg  Prednisone 06/24/20 4am- 50mg  Prednisone 06/24/20 10am - 50mg  Prednisone and 50mg  Benadryl

## 2020-06-18 ENCOUNTER — Ambulatory Visit
Admission: RE | Admit: 2020-06-18 | Discharge: 2020-06-18 | Disposition: A | Discharge status: Home / Self Care | Payer: Medicare Other | Source: Ambulatory Visit | Attending: Specialist | Admitting: Specialist

## 2020-06-18 ENCOUNTER — Other Ambulatory Visit: Payer: Medicare Other

## 2020-06-18 ENCOUNTER — Other Ambulatory Visit: Payer: Self-pay

## 2020-06-18 DIAGNOSIS — M25512 Pain in left shoulder: Secondary | ICD-10-CM

## 2020-06-23 ENCOUNTER — Encounter: Payer: Self-pay | Admitting: Surgery

## 2020-06-23 ENCOUNTER — Telehealth: Payer: Self-pay

## 2020-06-23 ENCOUNTER — Ambulatory Visit (INDEPENDENT_AMBULATORY_CARE_PROVIDER_SITE_OTHER): Payer: No Typology Code available for payment source | Admitting: Surgery

## 2020-06-23 ENCOUNTER — Other Ambulatory Visit: Payer: Self-pay

## 2020-06-23 ENCOUNTER — Ambulatory Visit (HOSPITAL_COMMUNITY)
Admission: RE | Admit: 2020-06-23 | Discharge: 2020-06-23 | Disposition: A | Discharge status: Home / Self Care | Payer: Medicare Other | Source: Ambulatory Visit | Attending: Surgery | Admitting: Surgery

## 2020-06-23 ENCOUNTER — Other Ambulatory Visit: Payer: Self-pay | Admitting: Physician Assistant

## 2020-06-23 ENCOUNTER — Ambulatory Visit (INDEPENDENT_AMBULATORY_CARE_PROVIDER_SITE_OTHER)
Admission: RE | Admit: 2020-06-23 | Discharge: 2020-06-23 | Disposition: A | Discharge status: Home / Self Care | Payer: Medicare Other | Source: Ambulatory Visit | Attending: Surgery | Admitting: Surgery

## 2020-06-23 VITALS — BP 83/56 | HR 77 | Temp 98.0°F | Resp 20 | Ht 72.0 in | Wt 199.0 lb

## 2020-06-23 DIAGNOSIS — I70213 Atherosclerosis of native arteries of extremities with intermittent claudication, bilateral legs: Secondary | ICD-10-CM | POA: Diagnosis not present

## 2020-06-23 DIAGNOSIS — I70211 Atherosclerosis of native arteries of extremities with intermittent claudication, right leg: Secondary | ICD-10-CM

## 2020-06-23 DIAGNOSIS — I6523 Occlusion and stenosis of bilateral carotid arteries: Secondary | ICD-10-CM | POA: Insufficient documentation

## 2020-06-23 NOTE — Telephone Encounter (Signed)
Pt called [facility] stating he took his 13 hour prep on the wrong day. Pt reports he took his 13 hour prep on 06/22/20 instead of on 06/23/20. I spoke to Dr. Maree Erie who stated the patient does not need another 13 hour prep called in as he will not use contrast for the procedure on 06/24/20. Pt aware of this and verbalized understanding.

## 2020-06-23 NOTE — Progress Notes (Signed)
Vascular and Vein Specialist of [facility]  Patient name: [name] MRN: [id number] DOB: 23-Dec-1945 Sex: male   REASON FOR VISIT:    Follow up  HISOTRY OF PRESENT ILLNESS:    [name] is a 74 y.o. 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  The last time I saw him he had a wound on his left leg.  He was being managed at [facility] by the wound center and ID.  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  Also within the past year he has had a heart attack as well as a fall.  Patient is a diabetic which has been relatively well controlled.  He is status post CABG in 2017.  He has a known right innominate artery stenosis, for which he has been asymptomatic.  He is medically managed for hypertension.  His former smoker.  He has a statin allergy.   PAST MEDICAL HISTORY:   Past Medical History:  Diagnosis Date  . Anemia    NOS  . Anxiety   . 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 BPH (benign prostatic hypertrophy)   . Broken foot Oct. 12, 2013   Right foot Fx  . CAD (coronary artery disease)   . Carotid stenosis, left   . Cellulitis and abscess of leg 03/2018   right leg  . Chronic hyponatremia   . Chronic pain syndrome   . COPD (chronic obstructive pulmonary disease) ([place])   . Depression   . Diabetes mellitus   . Diabetic foot ulcer ([place])   . DJD (degenerative joint disease)    L wrist  . DM2 (diabetes mellitus, type 2) ([place])   . GERD (gastroesophageal reflux disease)   . HLD (hyperlipidemia)   . HTN (hypertension)   . Hx of blood transfusion reaction   . Hyponatremia   . Lumbar disc disease   . MI (mitral incompetence)   . Myocardial infarction ([place])    x 2  . Osteoporosis    tx per [facility], Dr Prudencio Burly, thought due to heavy  steriod use after 1978  . Peripheral neuropathy   . Peripheral vascular disease ([place])   . Restless leg syndrome   . Spine fracture    hx, multiple  . Varicose veins   . 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 Vitamin D deficiency      FAMILY HISTORY:   Family History  Problem Relation Age of Onset  . Lung cancer Father 68  . Diabetes Mother 44  . Osteoporosis Mother   . Depression Mother   . Bleeding Disorder Mother   . Throat cancer Other        family hx - "bad living" - also lung CA, heart disease and renal failure    . Diabetes Sister   . Suicidality Son 20  . Lung cancer Maternal Grandmother 39  . Diabetes Maternal Grandfather 44  . Heart disease Maternal Grandfather   . Heart attack Paternal Grandmother   . Brain cancer Paternal Grandfather 20    SOCIAL HISTORY:   Social History   Tobacco Use  . Smoking status: Former Smoker    Packs/day: 1.50    Years: 30.00    Pack years: 45.00    Types: Cigarettes    Quit date:  11/06/1988    Years since quitting: 31.6  . Smokeless tobacco: Never Used  Substance Use Topics  . Alcohol use: Not Currently    Comment: None in 30 years but drank beer before- 05/09/20     ALLERGIES:   Allergies  Allergen Reactions  . Ivp Dye [Iodinated Diagnostic Agents]     Blood Pressure dropped----pt was pre-medicated with 13 hour prep and did fine with pre-meds--amy 03/09/11   . Penicillins Nausea Only    REACTION: stomach pain(IV ok) Has patient had a PCN reaction causing immediate rash, facial/tongue/throat swelling, SOB or lightheadedness with hypotension: No Has patient had a PCN reaction causing severe rash involving mucus membranes or skin necrosis: No Has patient had a PCN reaction that required hospitalization: No Has patient had a PCN reaction occurring within the last 10 years: No If all of the above answers are "NO", then may proceed with Cephalosporin use.   . Sulfa Antibiotics Hives  . Sulfonamide  Derivatives Hives and Itching  . Lisinopril Cough  . Metoprolol Nausea And Vomiting  . Statins Other (See Comments)    myalgias  . Propoxyphene N-Acetaminophen Other (See Comments)    Sharp pains- headache     CURRENT MEDICATIONS:   Current Outpatient Medications  Medication Sig Dispense Refill  . ACCU-CHEK FASTCLIX LANCETS MISC Use as directed to check blood glucose. Dx: E11.51 102 each 6  . albuterol (PROAIR HFA) 108 (90 Base) MCG/ACT inhaler Inhale 2 puffs into the lungs every 6 (six) hours as needed for wheezing or shortness of breath.    Marland Kitchen aspirin EC 81 MG tablet Take 81 mg by mouth daily.    . Blood Glucose Monitoring Suppl (ACCU-CHEK AVIVA PLUS) w/Device KIT Use as directed to check blood glucose daily. Dx: E11.51 1 kit 0  . BRILINTA 90 MG TABS tablet Take 90 mg by mouth 2 (two) times daily.    Marland Kitchen buPROPion (WELLBUTRIN XL) 300 MG 24 hr tablet Take 1 tablet (300 mg total) by mouth daily. 90 tablet 1  . celecoxib (CELEBREX) 200 MG capsule TAKE 1 CAPSULE(200 MG) BY MOUTH DAILY (Patient taking differently: Take 200 mg by mouth 2 (two) times daily. ) 30 capsule 0  . Cholecalciferol (VITAMIN D3) 5000 units TABS Take 5,000 Units by mouth daily.    . clindamycin (CLEOCIN) 300 MG capsule Take 1 capsule (300 mg total) by mouth 4 (four) times daily. X 7 days 28 capsule 0  . clonazePAM (KLONOPIN) 0.5 MG tablet Take 1 tablet (0.5 mg total) by mouth 2 (two) times daily as needed for anxiety. 60 tablet 1  . ENTRESTO 49-51 MG Take 1 tablet by mouth 2 (two) times daily.    . fentaNYL (DURAGESIC) 12 MCG/HR Place 1 patch onto the skin every 3 (three) days.     . furosemide (LASIX) 20 MG tablet TAKE 1 TABLET BY MOUTH TWICE DAILY (Patient taking differently: Take 20 mg by mouth 2 (two) times daily. ) 180 tablet 1  . gabapentin (NEURONTIN) 400 MG capsule 1 po q am, 1 po at lunch, 2 po qhs. (Patient taking differently: Take 400-800 mg by mouth See admin instructions. Take 400 mg by mouth in the morning and  at lunch and 800 mg at bedtime) 360 capsule 1  . glucose blood (ACCU-CHEK AVIVA PLUS) test strip 1 each by Other route as needed for other. Use as instructed. Dx: E11.51 100 each 6  . IRON PO Take 1 capsule by mouth daily.    . isosorbide mononitrate (  IMDUR) 60 MG 24 hr tablet Take 60 mg by mouth daily.    Marland Kitchen JANUVIA 100 MG tablet TAKE 1 TABLET BY MOUTH EVERY DAY (Patient taking differently: Take 100 mg by mouth daily. ) 90 tablet 1  . Multiple Vitamin (MULTIVITAMIN WITH MINERALS) TABS tablet Take 1 tablet by mouth daily.    Marland Kitchen NARCAN 4 MG/0.1ML LIQD nasal spray kit Place 4 mg into the nose as needed (opioid reversal).     . nitroGLYCERIN (NITROSTAT) 0.4 MG SL tablet PLACE 1 TABLET UNDER THE TONGUE EVERY 5 MINUTES AS NEEDED FOR CHEST PAIN (Patient taking differently: Place 0.4 mg under the tongue every 5 (five) minutes as needed for chest pain. ) 25 tablet 2  . omeprazole (PRILOSEC) 40 MG capsule Take 40 mg by mouth 2 (two) times daily.    Marland Kitchen oxyCODONE-acetaminophen (PERCOCET) 10-325 MG tablet Take 1 tablet by mouth in the morning and at bedtime.     Marland Kitchen rOPINIRole (REQUIP) 1 MG tablet Take 1 tablet (1 mg total) by mouth in the morning and at bedtime. 180 tablet 0  . tamsulosin (FLOMAX) 0.4 MG CAPS capsule Take 1 capsule (0.4 mg total) by mouth 2 (two) times daily. (Patient taking differently: Take 0.4 mg by mouth daily after supper. ) 180 capsule 0  . amiodarone (PACERONE) 200 MG tablet Take 200 mg by mouth daily. (Patient not taking: Reported on 05/06/2020)    . carvedilol (COREG) 6.25 MG tablet Take 6.25 mg by mouth 2 (two) times daily. (Patient not taking: Reported on 02/22/2020)    . desonide (DESOWEN) 0.05 % cream Apply 1 application topically daily as needed (rosacea).  (Patient not taking: Reported on 05/06/2020)    . diclofenac Sodium (VOLTAREN) 1 % GEL Apply 2 g topically 4 (four) times daily. (Patient not taking: Reported on 05/06/2020) 100 g 2  . lidocaine (LIDODERM) 5 % Place 1 patch onto the skin  daily. Remove & Discard patch within 12 hours or as directed by MD (Patient not taking: Reported on 02/22/2020) 30 patch 0  . PRALUENT 75 MG/ML SOAJ Inject 75 mg into the skin every 14 (fourteen) days. (Patient not taking: Reported on 03/26/2020)    . pravastatin (PRAVACHOL) 20 MG tablet Take 20 mg by mouth at bedtime. (Patient not taking: Reported on 02/22/2020)    . senna-docusate (SENOKOT-S) 8.6-50 MG tablet Take 2 tablets by mouth 2 (two) times daily. (Patient not taking: Reported on 05/06/2020)     No current facility-administered medications for this visit.    REVIEW OF SYSTEMS:   [X]  denotes positive finding, [ ]  denotes negative finding Cardiac  Comments:  Chest pain or chest pressure:    Shortness of breath upon exertion:    Short of breath when lying flat:    Irregular heart rhythm:        Vascular    Pain in calf, thigh, or hip brought on by ambulation:    Pain in feet at night that wakes you up from your sleep:     Blood clot in your veins:    Leg swelling:         Pulmonary    Oxygen at home:    Productive cough:     Wheezing:         Neurologic    Sudden weakness in arms or legs:     Sudden numbness in arms or legs:     Sudden onset of difficulty speaking or slurred speech:    Temporary loss of vision  in one eye:     Problems with dizziness:         Gastrointestinal    Blood in stool:     Vomited blood:         Genitourinary    Burning when urinating:     Blood in urine:        Psychiatric    Major depression:         Hematologic    Bleeding problems:    Problems with blood clotting too easily:        Skin    Rashes or ulcers:        Constitutional    Fever or chills:      PHYSICAL EXAM:   Vitals:   06/23/20 1104 06/23/20 1106  BP: (!) 149/56 (!) 83/56  Pulse: 77   Resp: 20   Temp: 98 F (36.7 C)   SpO2: 97%   Weight: 199 lb (90.3 kg)   Height: 6' (1.829 m)     GENERAL: The patient is a well-nourished male, in no acute distress. The vital  signs are documented above. CARDIAC: There is a regular rate and rhythm.  VASCULAR: Nonpalpable pedal pulses PULMONARY: Non-labored respirations  MUSCULOSKELETAL: There are no major deformities or cyanosis. NEUROLOGIC: No focal weakness or paresthesias are detected. SKIN: There are no ulcers or rashes noted. PSYCHIATRIC: The patient has a normal affect.  STUDIES:   I have reviewed the following studies Carotid duplex: Right Carotid: Velocities in the right ICA are consistent with a 1-39%  stenosis.   Left Carotid: Velocities in the left ICA are consistent with a 1-39%  stenosis.   Vertebrals: Left vertebral artery demonstrates antegrade flow. Right  vertebral        artery demonstrates retrograde flow.  Subclavians: Right subclavian artery flow was disturbed. Normal flow        hemodynamics were seen in the left subclavian artery.  +-------+-----------+-----------+------------+------------+  ABI/TBIToday's ABIToday's TBIPrevious ABIPrevious TBI  +-------+-----------+-----------+------------+------------+  Right 1.18    0.61    1.29    0.61      +-------+-----------+-----------+------------+------------+  Left  0.50    0.46    0.64    0.44      +-------+-----------+-----------+------------+------------+  Right toe pressure = 89 Left toe pressure = 67 MEDICAL ISSUES:   PAD: The patient ultimately underwent left fifth toe amputation at [facility].  This has healed.  He does not have any active ulcers.  He does not suffer claudication.  No intervention is recommended at this time.  Carotid: Widely patent bilateral carotid arteries.  The patient will return in 1 year for surveillance    Leia Alf, MD, FACS [facility] and Vein Specialists of [facility] [phone] Pager [phone]

## 2020-06-23 NOTE — Telephone Encounter (Signed)
[name]?

## 2020-06-24 ENCOUNTER — Ambulatory Visit
Admission: RE | Admit: 2020-06-24 | Discharge: 2020-06-24 | Disposition: A | Discharge status: Home / Self Care | Payer: Medicare Other | Source: Ambulatory Visit | Attending: Physician Assistant | Admitting: Physician Assistant

## 2020-06-24 DIAGNOSIS — M25412 Effusion, left shoulder: Secondary | ICD-10-CM

## 2020-06-29 LAB — ANAEROBIC AND AEROBIC CULTURE
AER RESULT:: NO GROWTH
MICRO NUMBER:: 11179333
MICRO NUMBER:: 11179334
SPECIMEN QUALITY:: ADEQUATE
SPECIMEN QUALITY:: ADEQUATE

## 2020-06-29 LAB — CELL COUNT + DIFF, W/O CRYST-SYNVL FLD
Basophils, %: 0 %
Eosinophils-Synovial: 0 % (ref 0–2)
Lymphocytes-Synovial Fld: 15 % (ref 0–74)
Monocyte/Macrophage: 6 % (ref 0–69)
Neutrophil, Synovial: 79 % — ABNORMAL HIGH (ref 0–24)
Synoviocytes, %: 0 % (ref 0–15)
WBC, Synovial: 69770 cells/uL — ABNORMAL HIGH (ref <150)

## 2020-07-09 ENCOUNTER — Emergency Department (HOSPITAL_COMMUNITY): Payer: Medicare Other

## 2020-07-09 ENCOUNTER — Encounter (HOSPITAL_COMMUNITY): Payer: Self-pay | Admitting: Emergency Medicine

## 2020-07-09 ENCOUNTER — Inpatient Hospital Stay (HOSPITAL_COMMUNITY)
Admission: EM | Admit: 2020-07-09 | Discharge: 2020-07-22 | DRG: 853 | Disposition: A | Discharge status: Home Health | Payer: Medicare Other | Attending: Internal Medicine | Admitting: Internal Medicine

## 2020-07-09 ENCOUNTER — Other Ambulatory Visit: Payer: Self-pay

## 2020-07-09 DIAGNOSIS — Z9889 Other specified postprocedural states: Secondary | ICD-10-CM | POA: Diagnosis not present

## 2020-07-09 DIAGNOSIS — E8770 Fluid overload, unspecified: Secondary | ICD-10-CM | POA: Diagnosis present

## 2020-07-09 DIAGNOSIS — E861 Hypovolemia: Secondary | ICD-10-CM | POA: Diagnosis present

## 2020-07-09 DIAGNOSIS — E1169 Type 2 diabetes mellitus with other specified complication: Secondary | ICD-10-CM | POA: Diagnosis present

## 2020-07-09 DIAGNOSIS — M810 Age-related osteoporosis without current pathological fracture: Secondary | ICD-10-CM | POA: Diagnosis present

## 2020-07-09 DIAGNOSIS — E1142 Type 2 diabetes mellitus with diabetic polyneuropathy: Secondary | ICD-10-CM | POA: Diagnosis present

## 2020-07-09 DIAGNOSIS — B952 Enterococcus as the cause of diseases classified elsewhere: Secondary | ICD-10-CM | POA: Diagnosis present

## 2020-07-09 DIAGNOSIS — J9691 Respiratory failure, unspecified with hypoxia: Secondary | ICD-10-CM

## 2020-07-09 DIAGNOSIS — I255 Ischemic cardiomyopathy: Secondary | ICD-10-CM | POA: Diagnosis not present

## 2020-07-09 DIAGNOSIS — A4189 Other specified sepsis: Secondary | ICD-10-CM | POA: Diagnosis not present

## 2020-07-09 DIAGNOSIS — J810 Acute pulmonary edema: Secondary | ICD-10-CM | POA: Diagnosis not present

## 2020-07-09 DIAGNOSIS — Z7902 Long term (current) use of antithrombotics/antiplatelets: Secondary | ICD-10-CM

## 2020-07-09 DIAGNOSIS — G9341 Metabolic encephalopathy: Secondary | ICD-10-CM | POA: Diagnosis present

## 2020-07-09 DIAGNOSIS — Z7982 Long term (current) use of aspirin: Secondary | ICD-10-CM

## 2020-07-09 DIAGNOSIS — E872 Acidosis: Secondary | ICD-10-CM | POA: Diagnosis present

## 2020-07-09 DIAGNOSIS — R0600 Dyspnea, unspecified: Secondary | ICD-10-CM | POA: Diagnosis not present

## 2020-07-09 DIAGNOSIS — E785 Hyperlipidemia, unspecified: Secondary | ICD-10-CM | POA: Diagnosis present

## 2020-07-09 DIAGNOSIS — I5042 Chronic combined systolic (congestive) and diastolic (congestive) heart failure: Secondary | ICD-10-CM | POA: Diagnosis present

## 2020-07-09 DIAGNOSIS — R600 Localized edema: Secondary | ICD-10-CM | POA: Diagnosis not present

## 2020-07-09 DIAGNOSIS — I340 Nonrheumatic mitral (valve) insufficiency: Secondary | ICD-10-CM | POA: Diagnosis not present

## 2020-07-09 DIAGNOSIS — N401 Enlarged prostate with lower urinary tract symptoms: Secondary | ICD-10-CM | POA: Diagnosis present

## 2020-07-09 DIAGNOSIS — E782 Mixed hyperlipidemia: Secondary | ICD-10-CM

## 2020-07-09 DIAGNOSIS — E11621 Type 2 diabetes mellitus with foot ulcer: Secondary | ICD-10-CM | POA: Diagnosis present

## 2020-07-09 DIAGNOSIS — I214 Non-ST elevation (NSTEMI) myocardial infarction: Secondary | ICD-10-CM | POA: Diagnosis not present

## 2020-07-09 DIAGNOSIS — S42123A Displaced fracture of acromial process, unspecified shoulder, initial encounter for closed fracture: Secondary | ICD-10-CM | POA: Diagnosis present

## 2020-07-09 DIAGNOSIS — R069 Unspecified abnormalities of breathing: Secondary | ICD-10-CM

## 2020-07-09 DIAGNOSIS — E871 Hypo-osmolality and hyponatremia: Secondary | ICD-10-CM | POA: Diagnosis present

## 2020-07-09 DIAGNOSIS — M009 Pyogenic arthritis, unspecified: Secondary | ICD-10-CM | POA: Diagnosis present

## 2020-07-09 DIAGNOSIS — M60009 Infective myositis, unspecified site: Secondary | ICD-10-CM | POA: Diagnosis present

## 2020-07-09 DIAGNOSIS — A419 Sepsis, unspecified organism: Secondary | ICD-10-CM | POA: Diagnosis present

## 2020-07-09 DIAGNOSIS — I5022 Chronic systolic (congestive) heart failure: Secondary | ICD-10-CM | POA: Diagnosis not present

## 2020-07-09 DIAGNOSIS — F32A Depression, unspecified: Secondary | ICD-10-CM | POA: Diagnosis present

## 2020-07-09 DIAGNOSIS — Z888 Allergy status to other drugs, medicaments and biological substances status: Secondary | ICD-10-CM

## 2020-07-09 DIAGNOSIS — Z20822 Contact with and (suspected) exposure to covid-19: Secondary | ICD-10-CM | POA: Diagnosis present

## 2020-07-09 DIAGNOSIS — S40012A Contusion of left shoulder, initial encounter: Secondary | ICD-10-CM | POA: Diagnosis not present

## 2020-07-09 DIAGNOSIS — Z951 Presence of aortocoronary bypass graft: Secondary | ICD-10-CM | POA: Diagnosis not present

## 2020-07-09 DIAGNOSIS — R6521 Severe sepsis with septic shock: Secondary | ICD-10-CM | POA: Diagnosis present

## 2020-07-09 DIAGNOSIS — M00811 Arthritis due to other bacteria, right shoulder: Secondary | ICD-10-CM | POA: Diagnosis not present

## 2020-07-09 DIAGNOSIS — Z832 Family history of diseases of the blood and blood-forming organs and certain disorders involving the immune mechanism: Secondary | ICD-10-CM

## 2020-07-09 DIAGNOSIS — Z87891 Personal history of nicotine dependence: Secondary | ICD-10-CM

## 2020-07-09 DIAGNOSIS — I739 Peripheral vascular disease, unspecified: Secondary | ICD-10-CM | POA: Diagnosis not present

## 2020-07-09 DIAGNOSIS — Z955 Presence of coronary angioplasty implant and graft: Secondary | ICD-10-CM

## 2020-07-09 DIAGNOSIS — J9601 Acute respiratory failure with hypoxia: Secondary | ICD-10-CM | POA: Diagnosis not present

## 2020-07-09 DIAGNOSIS — T8112XA Postprocedural septic shock, initial encounter: Secondary | ICD-10-CM | POA: Diagnosis not present

## 2020-07-09 DIAGNOSIS — T8142XA Infection following a procedure, deep incisional surgical site, initial encounter: Secondary | ICD-10-CM | POA: Diagnosis not present

## 2020-07-09 DIAGNOSIS — D638 Anemia in other chronic diseases classified elsewhere: Secondary | ICD-10-CM | POA: Diagnosis present

## 2020-07-09 DIAGNOSIS — A401 Sepsis due to streptococcus, group B: Secondary | ICD-10-CM | POA: Diagnosis present

## 2020-07-09 DIAGNOSIS — Z95828 Presence of other vascular implants and grafts: Secondary | ICD-10-CM

## 2020-07-09 DIAGNOSIS — Z880 Allergy status to penicillin: Secondary | ICD-10-CM

## 2020-07-09 DIAGNOSIS — Z801 Family history of malignant neoplasm of trachea, bronchus and lung: Secondary | ICD-10-CM

## 2020-07-09 DIAGNOSIS — Z89021 Acquired absence of right finger(s): Secondary | ICD-10-CM

## 2020-07-09 DIAGNOSIS — Z79899 Other long term (current) drug therapy: Secondary | ICD-10-CM

## 2020-07-09 DIAGNOSIS — F419 Anxiety disorder, unspecified: Secondary | ICD-10-CM | POA: Diagnosis present

## 2020-07-09 DIAGNOSIS — M869 Osteomyelitis, unspecified: Secondary | ICD-10-CM | POA: Diagnosis present

## 2020-07-09 DIAGNOSIS — B965 Pseudomonas (aeruginosa) (mallei) (pseudomallei) as the cause of diseases classified elsewhere: Secondary | ICD-10-CM | POA: Diagnosis present

## 2020-07-09 DIAGNOSIS — E1151 Type 2 diabetes mellitus with diabetic peripheral angiopathy without gangrene: Secondary | ICD-10-CM | POA: Diagnosis present

## 2020-07-09 DIAGNOSIS — I25118 Atherosclerotic heart disease of native coronary artery with other forms of angina pectoris: Secondary | ICD-10-CM | POA: Diagnosis not present

## 2020-07-09 DIAGNOSIS — Y718 Miscellaneous cardiovascular devices associated with adverse incidents, not elsewhere classified: Secondary | ICD-10-CM | POA: Diagnosis present

## 2020-07-09 DIAGNOSIS — H5462 Unqualified visual loss, left eye, normal vision right eye: Secondary | ICD-10-CM | POA: Diagnosis present

## 2020-07-09 DIAGNOSIS — B954 Other streptococcus as the cause of diseases classified elsewhere: Secondary | ICD-10-CM | POA: Diagnosis not present

## 2020-07-09 DIAGNOSIS — J9600 Acute respiratory failure, unspecified whether with hypoxia or hypercapnia: Secondary | ICD-10-CM

## 2020-07-09 DIAGNOSIS — R0602 Shortness of breath: Secondary | ICD-10-CM

## 2020-07-09 DIAGNOSIS — I252 Old myocardial infarction: Secondary | ICD-10-CM

## 2020-07-09 DIAGNOSIS — T82858A Stenosis of vascular prosthetic devices, implants and grafts, initial encounter: Secondary | ICD-10-CM | POA: Diagnosis present

## 2020-07-09 DIAGNOSIS — E119 Type 2 diabetes mellitus without complications: Secondary | ICD-10-CM

## 2020-07-09 DIAGNOSIS — K219 Gastro-esophageal reflux disease without esophagitis: Secondary | ICD-10-CM | POA: Diagnosis present

## 2020-07-09 DIAGNOSIS — Z886 Allergy status to analgesic agent status: Secondary | ICD-10-CM

## 2020-07-09 DIAGNOSIS — M25511 Pain in right shoulder: Secondary | ICD-10-CM

## 2020-07-09 DIAGNOSIS — R6520 Severe sepsis without septic shock: Secondary | ICD-10-CM

## 2020-07-09 DIAGNOSIS — Z833 Family history of diabetes mellitus: Secondary | ICD-10-CM

## 2020-07-09 DIAGNOSIS — F05 Delirium due to known physiological condition: Secondary | ICD-10-CM | POA: Diagnosis present

## 2020-07-09 DIAGNOSIS — Z981 Arthrodesis status: Secondary | ICD-10-CM

## 2020-07-09 DIAGNOSIS — Z882 Allergy status to sulfonamides status: Secondary | ICD-10-CM

## 2020-07-09 DIAGNOSIS — R338 Other retention of urine: Secondary | ICD-10-CM | POA: Diagnosis present

## 2020-07-09 DIAGNOSIS — Z8249 Family history of ischemic heart disease and other diseases of the circulatory system: Secondary | ICD-10-CM

## 2020-07-09 DIAGNOSIS — Z818 Family history of other mental and behavioral disorders: Secondary | ICD-10-CM

## 2020-07-09 DIAGNOSIS — G2581 Restless legs syndrome: Secondary | ICD-10-CM | POA: Diagnosis present

## 2020-07-09 DIAGNOSIS — Z8262 Family history of osteoporosis: Secondary | ICD-10-CM

## 2020-07-09 DIAGNOSIS — M7989 Other specified soft tissue disorders: Secondary | ICD-10-CM | POA: Diagnosis not present

## 2020-07-09 DIAGNOSIS — T8144XA Sepsis following a procedure, initial encounter: Secondary | ICD-10-CM | POA: Diagnosis not present

## 2020-07-09 DIAGNOSIS — I2510 Atherosclerotic heart disease of native coronary artery without angina pectoris: Secondary | ICD-10-CM | POA: Diagnosis not present

## 2020-07-09 DIAGNOSIS — L97509 Non-pressure chronic ulcer of other part of unspecified foot with unspecified severity: Secondary | ICD-10-CM | POA: Diagnosis present

## 2020-07-09 DIAGNOSIS — J449 Chronic obstructive pulmonary disease, unspecified: Secondary | ICD-10-CM | POA: Diagnosis present

## 2020-07-09 DIAGNOSIS — I110 Hypertensive heart disease with heart failure: Secondary | ICD-10-CM | POA: Diagnosis present

## 2020-07-09 DIAGNOSIS — L03114 Cellulitis of left upper limb: Secondary | ICD-10-CM | POA: Diagnosis present

## 2020-07-09 DIAGNOSIS — M00212 Other streptococcal arthritis, left shoulder: Secondary | ICD-10-CM | POA: Diagnosis present

## 2020-07-09 DIAGNOSIS — M25412 Effusion, left shoulder: Secondary | ICD-10-CM | POA: Diagnosis present

## 2020-07-09 DIAGNOSIS — Z808 Family history of malignant neoplasm of other organs or systems: Secondary | ICD-10-CM

## 2020-07-09 DIAGNOSIS — Z4659 Encounter for fitting and adjustment of other gastrointestinal appliance and device: Secondary | ICD-10-CM

## 2020-07-09 DIAGNOSIS — G8929 Other chronic pain: Secondary | ICD-10-CM | POA: Diagnosis present

## 2020-07-09 DIAGNOSIS — I8390 Asymptomatic varicose veins of unspecified lower extremity: Secondary | ICD-10-CM | POA: Diagnosis present

## 2020-07-09 DIAGNOSIS — Z7984 Long term (current) use of oral hypoglycemic drugs: Secondary | ICD-10-CM

## 2020-07-09 DIAGNOSIS — M25512 Pain in left shoulder: Secondary | ICD-10-CM | POA: Diagnosis present

## 2020-07-09 DIAGNOSIS — I5033 Acute on chronic diastolic (congestive) heart failure: Secondary | ICD-10-CM | POA: Diagnosis not present

## 2020-07-09 DIAGNOSIS — E1165 Type 2 diabetes mellitus with hyperglycemia: Secondary | ICD-10-CM | POA: Diagnosis present

## 2020-07-09 LAB — PROTIME-INR
INR: 1.3 — ABNORMAL HIGH (ref 0.8–1.2)
Prothrombin Time: 15.3 seconds — ABNORMAL HIGH (ref 11.4–15.2)

## 2020-07-09 LAB — CBC WITH DIFFERENTIAL/PLATELET
Abs Immature Granulocytes: 0.12 10*3/uL — ABNORMAL HIGH (ref 0.00–0.07)
Basophils Absolute: 0 10*3/uL (ref 0.0–0.1)
Basophils Relative: 0 %
Eosinophils Absolute: 0 10*3/uL (ref 0.0–0.5)
Eosinophils Relative: 0 %
HCT: 32 % — ABNORMAL LOW (ref 39.0–52.0)
Hemoglobin: 10.5 g/dL — ABNORMAL LOW (ref 13.0–17.0)
Immature Granulocytes: 1 %
Lymphocytes Relative: 3 %
Lymphs Abs: 0.3 10*3/uL — ABNORMAL LOW (ref 0.7–4.0)
MCH: 29 pg (ref 26.0–34.0)
MCHC: 32.8 g/dL (ref 30.0–36.0)
MCV: 88.4 fL (ref 80.0–100.0)
Monocytes Absolute: 1.4 10*3/uL — ABNORMAL HIGH (ref 0.1–1.0)
Monocytes Relative: 12 %
Neutro Abs: 9.8 10*3/uL — ABNORMAL HIGH (ref 1.7–7.7)
Neutrophils Relative %: 84 %
Platelets: 360 10*3/uL (ref 150–400)
RBC: 3.62 MIL/uL — ABNORMAL LOW (ref 4.22–5.81)
RDW: 16.5 % — ABNORMAL HIGH (ref 11.5–15.5)
WBC: 11.6 10*3/uL — ABNORMAL HIGH (ref 4.0–10.5)
nRBC: 0 % (ref 0.0–0.2)

## 2020-07-09 LAB — COMPREHENSIVE METABOLIC PANEL
ALT: 16 U/L (ref 0–44)
AST: 19 U/L (ref 15–41)
Albumin: 3.2 g/dL — ABNORMAL LOW (ref 3.5–5.0)
Alkaline Phosphatase: 125 U/L (ref 38–126)
Anion gap: 11 (ref 5–15)
BUN: 29 mg/dL — ABNORMAL HIGH (ref 8–23)
CO2: 21 mmol/L — ABNORMAL LOW (ref 22–32)
Calcium: 9 mg/dL (ref 8.9–10.3)
Chloride: 92 mmol/L — ABNORMAL LOW (ref 98–111)
Creatinine, Ser: 1.08 mg/dL (ref 0.61–1.24)
GFR, Estimated: >60 mL/min (ref >60)
Glucose, Bld: 219 mg/dL — ABNORMAL HIGH (ref 70–99)
Potassium: 5.1 mmol/L (ref 3.5–5.1)
Sodium: 124 mmol/L — ABNORMAL LOW (ref 135–145)
Total Bilirubin: 0.8 mg/dL (ref 0.3–1.2)
Total Protein: 7.8 g/dL (ref 6.5–8.1)

## 2020-07-09 LAB — SYNOVIAL CELL COUNT + DIFF, W/ CRYSTALS
Crystals, Fluid: NONE SEEN
Lymphocytes-Synovial Fld: 3 % (ref 0–20)
Monocyte-Macrophage-Synovial Fluid: 1 % — ABNORMAL LOW (ref 50–90)
Neutrophil, Synovial: 96 % — ABNORMAL HIGH (ref 0–25)
WBC, Synovial: 30597 /mm3 — ABNORMAL HIGH (ref 0–200)

## 2020-07-09 LAB — LACTIC ACID, PLASMA
Lactic Acid, Venous: 1.6 mmol/L (ref 0.5–1.9)
Lactic Acid, Venous: 1.2 mmol/L (ref 0.5–1.9)

## 2020-07-09 LAB — C-REACTIVE PROTEIN: CRP: 29 mg/dL — ABNORMAL HIGH (ref <1.0)

## 2020-07-09 LAB — RESP PANEL BY RT-PCR (FLU A&B, COVID) ARPGX2
Influenza A by PCR: NEGATIVE
Influenza B by PCR: NEGATIVE
SARS Coronavirus 2 by RT PCR: NEGATIVE

## 2020-07-09 LAB — APTT: aPTT: 38 seconds — ABNORMAL HIGH (ref 24–36)

## 2020-07-09 MED ORDER — NOREPINEPHRINE 4 MG/250ML-% IV SOLN
2.0000 ug/min | INTRAVENOUS | Status: DC
  Administered 2020-07-09: 2 ug/min via INTRAVENOUS
  Filled 2020-07-09: qty 250

## 2020-07-09 MED ORDER — LACTATED RINGERS IV BOLUS
500.0000 mL | Freq: Once | INTRAVENOUS | Status: AC
  Administered 2020-07-09: 500 mL via INTRAVENOUS

## 2020-07-09 MED ORDER — LACTATED RINGERS IV BOLUS (SEPSIS)
1000.0000 mL | Freq: Once | INTRAVENOUS | Status: AC
  Administered 2020-07-09: 1000 mL via INTRAVENOUS

## 2020-07-09 MED ORDER — SODIUM CHLORIDE 0.9 % IV SOLN
2.0000 g | Freq: Once | INTRAVENOUS | Status: AC
  Administered 2020-07-09: 2 g via INTRAVENOUS
  Filled 2020-07-09: qty 20

## 2020-07-09 MED ORDER — LIDOCAINE-EPINEPHRINE (PF) 2 %-1:200000 IJ SOLN
10.0000 mL | Freq: Once | INTRAMUSCULAR | Status: AC
  Administered 2020-07-09: 10 mL
  Filled 2020-07-09: qty 20

## 2020-07-09 MED ORDER — SODIUM CHLORIDE 0.9 % IV SOLN
250.0000 mL | INTRAVENOUS | Status: DC
  Administered 2020-07-18: 250 mL via INTRAVENOUS

## 2020-07-09 MED ORDER — SODIUM CHLORIDE 0.9 % IV SOLN: INTRAVENOUS | Status: DC

## 2020-07-09 MED ORDER — INSULIN ASPART 100 UNIT/ML ~~LOC~~ SOLN
0.0000 [IU] | SUBCUTANEOUS | Status: DC
  Administered 2020-07-10: 5 [IU] via SUBCUTANEOUS
  Administered 2020-07-10: 3 [IU] via SUBCUTANEOUS
  Administered 2020-07-10: 2 [IU] via SUBCUTANEOUS
  Administered 2020-07-11 (×3): 3 [IU] via SUBCUTANEOUS
  Administered 2020-07-11 (×2): 2 [IU] via SUBCUTANEOUS
  Administered 2020-07-12 (×2): 3 [IU] via SUBCUTANEOUS
  Administered 2020-07-12: 2 [IU] via SUBCUTANEOUS
  Administered 2020-07-12: 3 [IU] via SUBCUTANEOUS
  Administered 2020-07-12: 2 [IU] via SUBCUTANEOUS
  Administered 2020-07-13 (×4): 3 [IU] via SUBCUTANEOUS
  Administered 2020-07-13: 5 [IU] via SUBCUTANEOUS
  Administered 2020-07-13: 2 [IU] via SUBCUTANEOUS
  Administered 2020-07-14 (×4): 3 [IU] via SUBCUTANEOUS
  Administered 2020-07-14: 2 [IU] via SUBCUTANEOUS
  Administered 2020-07-14 – 2020-07-15 (×4): 3 [IU] via SUBCUTANEOUS
  Administered 2020-07-15: 2 [IU] via SUBCUTANEOUS
  Administered 2020-07-15 – 2020-07-16 (×2): 3 [IU] via SUBCUTANEOUS
  Administered 2020-07-16: 2 [IU] via SUBCUTANEOUS
  Administered 2020-07-16: 5 [IU] via SUBCUTANEOUS
  Administered 2020-07-16 – 2020-07-17 (×2): 2 [IU] via SUBCUTANEOUS
  Administered 2020-07-17: 3 [IU] via SUBCUTANEOUS
  Administered 2020-07-17 (×2): 5 [IU] via SUBCUTANEOUS
  Administered 2020-07-18: 3 [IU] via SUBCUTANEOUS
  Administered 2020-07-18: 2 [IU] via SUBCUTANEOUS
  Administered 2020-07-18 (×2): 3 [IU] via SUBCUTANEOUS
  Administered 2020-07-18: 2 [IU] via SUBCUTANEOUS
  Administered 2020-07-18: 3 [IU] via SUBCUTANEOUS
  Administered 2020-07-19: 2 [IU] via SUBCUTANEOUS
  Administered 2020-07-19: 3 [IU] via SUBCUTANEOUS
  Administered 2020-07-19: 2 [IU] via SUBCUTANEOUS
  Administered 2020-07-19: 3 [IU] via SUBCUTANEOUS
  Administered 2020-07-20: 2 [IU] via SUBCUTANEOUS
  Administered 2020-07-20: 3 [IU] via SUBCUTANEOUS
  Filled 2020-07-09: qty 0.15

## 2020-07-09 MED ORDER — VANCOMYCIN HCL IN DEXTROSE 1-5 GM/200ML-% IV SOLN: 1000.0000 mg | Freq: Once | INTRAVENOUS | Status: DC

## 2020-07-09 MED ORDER — ORAL CARE MOUTH RINSE
15.0000 mL | Freq: Two times a day (BID) | OROMUCOSAL | Status: DC
  Administered 2020-07-10 – 2020-07-16 (×10): 15 mL via OROMUCOSAL

## 2020-07-09 MED ORDER — PANTOPRAZOLE SODIUM 40 MG IV SOLR
40.0000 mg | INTRAVENOUS | Status: DC
  Administered 2020-07-10 – 2020-07-13 (×4): 40 mg via INTRAVENOUS
  Filled 2020-07-09 (×4): qty 40

## 2020-07-09 MED ORDER — NOREPINEPHRINE 4 MG/250ML-% IV SOLN
2.0000 ug/min | INTRAVENOUS | Status: DC
  Administered 2020-07-09 – 2020-07-10 (×2): 2 ug/min via INTRAVENOUS
  Administered 2020-07-10: 5 ug/min via INTRAVENOUS
  Administered 2020-07-11: 7 ug/min via INTRAVENOUS
  Administered 2020-07-11: 4.5 ug/min via INTRAVENOUS
  Administered 2020-07-12: 2 ug/min via INTRAVENOUS
  Administered 2020-07-12: 4 ug/min via INTRAVENOUS
  Filled 2020-07-09 (×5): qty 250

## 2020-07-09 MED ORDER — VANCOMYCIN HCL 1750 MG/350ML IV SOLN
1750.0000 mg | INTRAVENOUS | Status: AC
  Administered 2020-07-09: 1750 mg via INTRAVENOUS
  Filled 2020-07-09: qty 350

## 2020-07-09 MED ORDER — CHLORHEXIDINE GLUCONATE 0.12 % MT SOLN
15.0000 mL | Freq: Two times a day (BID) | OROMUCOSAL | Status: DC
  Filled 2020-07-09 (×2): qty 15

## 2020-07-09 MED ORDER — ENOXAPARIN SODIUM 40 MG/0.4ML ~~LOC~~ SOLN
40.0000 mg | SUBCUTANEOUS | Status: DC
  Administered 2020-07-10: 40 mg via SUBCUTANEOUS
  Filled 2020-07-09: qty 0.4

## 2020-07-09 MED ORDER — SODIUM CHLORIDE 0.9 % IV SOLN: 2.0000 g | INTRAVENOUS | Status: DC

## 2020-07-09 MED ORDER — CHLORHEXIDINE GLUCONATE CLOTH 2 % EX PADS
6.0000 | MEDICATED_PAD | Freq: Every day | CUTANEOUS | Status: DC
  Administered 2020-07-10 – 2020-07-22 (×13): 6 via TOPICAL

## 2020-07-09 MED ORDER — SODIUM CHLORIDE 0.9 % IV SOLN: 250.0000 mL | INTRAVENOUS | Status: DC

## 2020-07-09 NOTE — Progress Notes (Signed)
A consult was received from an ED physician for Vancomycin per pharmacy dosing.  The patient's profile has been reviewed for ht/wt/allergies/indication/available labs.    A one time order has been placed for Vancomycin 1750mg  IV.    Further antibiotics/pharmacy consults should be ordered by admitting physician if indicated.                       Thank you, Everette Rank, PharmD 07/09/2020  8:46 PM

## 2020-07-09 NOTE — H&P (Addendum)
NAME:  [name], MRN:  [id number], DOB:  [birth year], LOS: 0 ADMISSION DATE:  07/09/2020, CONSULTATION DATE: 07/09/2020 REFERRING MD:  Dr. Regenia Skeeter, ER, CHIEF COMPLAINT:  Sepsis   Brief History   74 yo male former smoker with hx of chronic lt shoulder pain requiring arthrocentesis developed weakness, fever 102F, and hypotension (BP 74/57).  Found to have swollen/red left arm.  Had arthrocentesis in ER and started on antibiotics.  PCCM asked to admit to ICU to manage sepsis.  History of present illness   Patient lethargic and not able to provide accurate history.  History from chart and ER staff.  Past Medical History  Vit D deficiency, Varicose veins, RLS, PAD, Peripheral neuropathy, Osteoporosis, CAD, Hyponatremia, HTN, HLD, DM type 2, Diabetic foot, Depression, COPD, Chronic pain, BPH, Blind Lt eye, Anxiety, GERD, BPH  Significant Hospital Events   11/24 Admit  Consults:  Emerg-Ortho  Procedures:    Significant Diagnostic Tests:  Lt shoulder arthrocentesis 11/24 >> brown/turbid, WBC 30,597 (96% neutrophils)  Micro Data:  COVID/Flu 11/24 >> negative Lt shoulder 11/24 >> Blood 11/24 >> Urine 11/24 >>  MRSA PCR 11/25 >>   Antimicrobials:  Vancomycin 11/24 >> Rocephin 11/24 >>   Interim history/subjective:    Objective   Blood pressure 96/69, pulse 84, temperature 98.7 F (37.1 C), temperature source Oral, resp. rate 14, height 6' (1.829 m), weight 86.2 kg, SpO2 99 %.        Intake/Output Summary (Last 24 hours) at 07/09/2020 2356 Last data filed at 07/09/2020 2346 Gross per 24 hour  Intake 0.64 ml  Output --  Net 0.64 ml   Filed Weights   07/09/20 2017  Weight: 86.2 kg    Examination:  General - somnolent, wakes up easily with stimulation Eyes - wearing glasses ENT - no sinus tenderness, no stridor Cardiac - regular rate/rhythm, no murmur Chest - equal breath sounds b/l, no wheezing or rales Abdomen - soft, non tender, + bowel  sounds Extremities - left upper arm swollen and red, right lower leg swollen and red Neuro - follows commands, able to state his name and location, moves all extremities  Resolved Hospital Problem list     Assessment & Plan:   Severe sepsis likely from septic arthritis of left shoulder. - continue antibiotics - Emerg-Ortho consulted by EDP - pressors through peripheral IV to keep MAP > 65 - continue IV fluids - f/u culture results  Hyponatremia in setting of hypovolemia. - continue normal saline IV fluid - f/u BMET - check TSH, cortisol  Right leg swelling. - check doppler of right leg  Hx of CAD, HTN, HLD. - hold outpt ASA, brilinta, entresto, lasix, imdur - f/u Echo  Acute metabolic encephalopathy secondary to sepsis. Hx of chronic joint pain, restless leg syndrome, diabetic neuropathy. - hold outpt wellbutrin, celebrex, klonopin, duragesic patch, neurontin, percocet, requip  Hx of COPD. - prn albuterol  DM type 2 poorly controlled with hyperglycemia. - SSI - hold outpt januvia  Hx of GERD. - protonix IV  Hx of BPH. - hold outpt flomax  Anemia of critical illness. - f/u CBC - transfuse for Hb < 7 or significant bleeding  Best practice (evaluated daily)   Diet: NPO DVT prophylaxis: Lovenox GI prophylaxis: Protonix Mobility: Bed rest last date of multidisciplinary goals of care discussion Family and staff present  Summary of discussion  Follow up goals of care discussion due Code Status: Full code Disposition: ICU  Labs   CBC: Recent Labs  Lab 07/09/20 2105  WBC 11.6*  NEUTROABS 9.8*  HGB 10.5*  HCT 32.0*  MCV 88.4  PLT 323    Basic Metabolic Panel: Recent Labs  Lab 07/09/20 2105  NA 124*  K 5.1  CL 92*  CO2 21*  GLUCOSE 219*  BUN 29*  CREATININE 1.08  CALCIUM 9.0   GFR: Estimated Creatinine Clearance: 65.9 mL/min (by C-G formula based on SCr of 1.08 mg/dL). Recent Labs  Lab 07/09/20 2105 07/09/20 2326  WBC 11.6*  --    LATICACIDVEN 1.6 1.2    Liver Function Tests: Recent Labs  Lab 07/09/20 2105  AST 19  ALT 16  ALKPHOS 125  BILITOT 0.8  PROT 7.8  ALBUMIN 3.2*   No results for input(s): LIPASE, AMYLASE in the last 168 hours. No results for input(s): AMMONIA in the last 168 hours.  ABG    Component Value Date/Time   PHART 7.317 (L) 09/22/2015 1908   PCO2ART 44.4 09/22/2015 1908   PO2ART 81.0 09/22/2015 1908   HCO3 22.8 09/22/2015 1908   TCO2 22 05/07/2020 1444   ACIDBASEDEF 3.0 (H) 09/22/2015 1908   O2SAT 95.0 09/22/2015 1908     Coagulation Profile: Recent Labs  Lab 07/09/20 2105  INR 1.3*    Cardiac Enzymes: No results for input(s): CKTOTAL, CKMB, CKMBINDEX, TROPONINI in the last 168 hours.  HbA1C: Hemoglobin A1C  Date/Time Value Ref Range Status  12/01/2017 12:00 AM 6.6  Final  07/28/2017 12:00 AM 6.5  Final   Hgb A1c MFr Bld  Date/Time Value Ref Range Status  05/12/2020 06:22 PM 7.2 (H) 4.8 - 5.6 % Final    Comment:    (NOTE) Pre diabetes:          5.7%-6.4%  Diabetes:              >6.4%  Glycemic control for   <7.0% adults with diabetes   06/28/2018 03:25 PM 6.5 (H) <5.7 % of total Hgb Final    Comment:    For someone without known diabetes, a hemoglobin A1c value of 6.5% or greater indicates that they may have  diabetes and this should be confirmed with a follow-up  test. . For someone with known diabetes, a value <7% indicates  that their diabetes is well controlled and a value  greater than or equal to 7% indicates suboptimal  control. A1c targets should be individualized based on  duration of diabetes, age, comorbid conditions, and  other considerations. . Currently, no consensus exists regarding use of hemoglobin A1c for diagnosis of diabetes for children. .     CBG: No results for input(s): GLUCAP in the last 168 hours.  Review of Systems:   Unable to obtain  Past Medical History  He,  has a past medical history of Anemia, Anxiety,  Blindness of left eye, BPH (benign prostatic hypertrophy), Broken foot (Oct. 12, 2013), CAD (coronary artery disease), Carotid stenosis, left, Cellulitis and abscess of leg (03/2018), Chronic hyponatremia, Chronic pain syndrome, COPD (chronic obstructive pulmonary disease) ([place]), Depression, Diabetes mellitus, Diabetic foot ulcer ([place]), DJD (degenerative joint disease), DM2 (diabetes mellitus, type 2) ([place]), GERD (gastroesophageal reflux disease), HLD (hyperlipidemia), HTN (hypertension), blood transfusion reaction, Hyponatremia, Lumbar disc disease, MI (mitral incompetence), Myocardial infarction ([place]), Osteoporosis, Peripheral neuropathy, Peripheral vascular disease ([place]), Restless leg syndrome, Spine fracture, Varicose veins, Visual impairment of left eye, and Vitamin D deficiency.   Surgical History    Past Surgical History:  Procedure Laterality Date  . ANGIOPLASTY    . aorta  bifemoral bypass grafting  09/2010  . CARDIAC CATHETERIZATION N/A 09/19/2015   Procedure: Left Heart Cath and Coronary Angiography;  Surgeon: Adrian Prows, MD;  Location: [facility];  Service: Cardiovascular;  Laterality: N/A;  . CARPAL TUNNEL RELEASE     right 2006/ left 2007  . CATARACT EXTRACTION     left 1996/ right 1997  . CORONARY ARTERY BYPASS GRAFT N/A 09/22/2015   Procedure: CORONARY ARTERY BYPASS GRAFTING (CABG) times four using the right greater saphenous vein harvested endoscopically and the left internal mammary artery.  LIMA-LAD, SEQ SVG-DIAG & OM, SVG-PD.;  Surgeon: [name], MD;  Location: [facility];  Service: Open Heart Surgery;  Laterality: N/A;  . I & D EXTREMITY Right 04/07/2018   Procedure: IRRIGATION AND DEBRIDEMENT ABSCESS RIGHT LEG;  Surgeon: Newt Minion, MD;  Location: [facility];  Service: Orthopedics;  Laterality: Right;  . INCISION AND DRAINAGE OF WOUND Right 05/07/2020   Procedure: RIGHT LONG FINGER IRRIGATION AND DEBRIDEMENT AND AMPUTATION;  Surgeon: Iran Planas, MD;  Location: [facility];   Service: Orthopedics;  Laterality: Right;  with IV sedation  . INCISION AND DRAINAGE OF WOUND Right 05/12/2020   Procedure: IRRIGATION AND DEBRIDEMENT WOUND and possible revision amputation;  Surgeon: Iran Planas, MD;  Location: [facility];  Service: Orthopedics;  Laterality: Right;  needs 60 minutes  . L foot open repair jones fracture  2010    5th metetarsal   . left ankle ganglion cyst  1976  . left carotid endarterectomy  03/2011  . left cataract  1996   right - 1997  . left CTS  2006   R CTS - 2007  . left foot surgery  1998   R surgery/fracture - 1999  . left plantar ganglion cystectomy  1979  . left wrist/hand fusion  2008  . [place], 2004  . NASAL SINUS SURGERY     multiple- x8. last 1997 with obliteration  . REPAIR THORACIC AORTA  2017  . right hand fracture  1969  . ROTATOR CUFF REPAIR  2006   R, than repeat 2011, Dr Theda Sers  . TRIGGER FINGER RELEASE Left 11/11/2014   Procedure: LEFT LONG FINGER RELEASE TRIGGER FINGER/A-1 PULLEY;  Surgeon: Milly Jakob, MD;  Location: [facility];  Service: Orthopedics;  Laterality: Left;  . vocal surgery  1996     Social History   reports that he quit smoking about 31 years ago. His smoking use included cigarettes. He has a 45.00 pack-year smoking history. He has never used smokeless tobacco. He reports previous alcohol use. He reports that he does not use drugs.   Family History   His family history includes Bleeding Disorder in his mother; Brain cancer (age of onset: 38) in his paternal grandfather; Depression in his mother; Diabetes in his sister; Diabetes (age of onset: 36) in his maternal grandfather; Diabetes (age of onset: 10) in his mother; Heart attack in his paternal grandmother; Heart disease in his maternal grandfather; Lung cancer (age of onset: 79) in his maternal grandmother; Lung cancer (age of onset: 49) in his father; Osteoporosis in his mother; Suicidality (age of onset: 67) in his son; Throat  cancer in an other family member.   Allergies Allergies  Allergen Reactions  . Ivp Dye [Iodinated Diagnostic Agents]     Blood Pressure dropped----pt was pre-medicated with 13 hour prep and did fine with pre-meds--amy 03/09/11   . Penicillins Nausea Only    REACTION: stomach pain(IV ok)  Has patient had a PCN reaction causing immediate rash, facial/tongue/throat swelling, SOB or lightheadedness with hypotension: No Has patient had a PCN reaction causing severe rash involving mucus membranes or skin necrosis: No Has patient had a PCN reaction that required hospitalization: No Has patient had a PCN reaction occurring within the last 10 years: No If all of the above answers are "NO", then may proceed with Cephalosporin use.   . Sulfa Antibiotics Hives  . Sulfonamide Derivatives Hives and Itching  . Lisinopril Cough  . Metoprolol Nausea And Vomiting  . Statins Other (See Comments)    myalgias  . Propoxyphene N-Acetaminophen Other (See Comments)    Sharp pains- headache     Home Medications  Prior to Admission medications   Medication Sig Start Date End Date Taking? Authorizing Provider  ACCU-CHEK FASTCLIX LANCETS MISC Use as directed to check blood glucose. Dx: E11.51 01/17/18   [name], NP  albuterol (PROAIR HFA) 108 (90 Base) MCG/ACT inhaler Inhale 2 puffs into the lungs every 6 (six) hours as needed for wheezing or shortness of breath.    [provider]  aspirin EC 81 MG tablet Take 81 mg by mouth daily.    [provider]  Blood Glucose Monitoring Suppl (ACCU-CHEK AVIVA PLUS) w/Device KIT Use as directed to check blood glucose daily. Dx: E11.51 01/17/18   [name], NP  BRILINTA 90 MG TABS tablet Take 90 mg by mouth 2 (two) times daily. 11/21/19   [provider]  buPROPion (WELLBUTRIN XL) 300 MG 24 hr tablet Take 1 tablet (300 mg total) by mouth daily. 01/25/20   Donnal Moat T, PA-C  celecoxib (CELEBREX) 200 MG capsule TAKE 1 CAPSULE(200  MG) BY MOUTH DAILY Patient taking differently: Take 200 mg by mouth 2 (two) times daily.  10/02/18   [name], NP  Cholecalciferol (VITAMIN D3) 5000 units TABS Take 5,000 Units by mouth daily.    [provider]  clonazePAM (KLONOPIN) 0.5 MG tablet Take 1 tablet (0.5 mg total) by mouth 2 (two) times daily as needed for anxiety. 06/11/20   Hurst, Teresa T, PA-C  ENTRESTO 49-51 MG Take 1 tablet by mouth 2 (two) times daily. 12/11/19   [provider]  fentaNYL (DURAGESIC) 12 MCG/HR Place 1 patch onto the skin every 3 (three) days.  12/19/19   [provider]  furosemide (LASIX) 20 MG tablet TAKE 1 TABLET BY MOUTH TWICE DAILY Patient taking differently: Take 20 mg by mouth 2 (two) times daily.  07/17/19   Adrian Prows, MD  gabapentin (NEURONTIN) 400 MG capsule 1 po q am, 1 po at lunch, 2 po qhs. Patient taking differently: Take 400-800 mg by mouth See admin instructions. Take 400 mg by mouth in the morning and at lunch and 800 mg at bedtime 03/26/20   Donnal Moat T, PA-C  glucose blood (ACCU-CHEK AVIVA PLUS) test strip 1 each by Other route as needed for other. Use as instructed. Dx: E11.51 01/17/18   [name], NP  IRON PO Take 1 capsule by mouth daily.    [provider]  isosorbide mononitrate (IMDUR) 60 MG 24 hr tablet Take 60 mg by mouth daily. 12/11/19   [provider]  JANUVIA 100 MG tablet TAKE 1 TABLET BY MOUTH EVERY DAY Patient taking differently: Take 100 mg by mouth daily.  05/19/18   [name], DO  Multiple Vitamin (MULTIVITAMIN WITH MINERALS) TABS tablet Take 1 tablet by mouth daily.    [provider]  NARCAN 4 MG/0.1ML LIQD nasal spray kit Place 4 mg into the nose as needed (opioid reversal).  06/18/19   [provider]  nitroGLYCERIN (NITROSTAT) 0.4 MG SL tablet PLACE 1 TABLET UNDER THE TONGUE EVERY 5 MINUTES AS NEEDED FOR CHEST PAIN Patient taking differently: Place 0.4 mg under the tongue every 5 (five)  minutes as needed for chest pain.  07/17/19   Adrian Prows, MD  omeprazole (PRILOSEC) 40 MG capsule Take 40 mg by mouth 2 (two) times daily. 11/26/19   [provider]  oxyCODONE-acetaminophen (PERCOCET) 10-325 MG tablet Take 1 tablet by mouth in the morning and at bedtime.  12/24/19   [provider]  rOPINIRole (REQUIP) 1 MG tablet Take 1 tablet (1 mg total) by mouth in the morning and at bedtime. 06/11/20   Addison Lank, PA-C  tamsulosin (FLOMAX) 0.4 MG CAPS capsule Take 1 capsule (0.4 mg total) by mouth 2 (two) times daily. Patient taking differently: Take 0.4 mg by mouth daily after supper.  11/16/18   [name], NP     Critical care time: 41 minutes  Chesley Mires, MD [place] Pager - [phone] 07/10/2020, 12:11 AM

## 2020-07-09 NOTE — ED Triage Notes (Signed)
Patient states he has not been feeling well the past few days. Patient states that he had a fall recently and was on the floor for a long time but is unsure when. Patient A&Ox4, but noted to have some confusion. Patient complaining of generalized pain, but specifically left arm pain. Swelling and erythema noted from shoulder to elbow with decreased ROM.

## 2020-07-09 NOTE — ED Provider Notes (Signed)
[name] Provider Note   CSN: [id number] Arrival date & time: 07/09/20  2005     History Chief Complaint  Patient presents with  . Arm Pain  . Hypotension    [name] is a 74 y.o. male.  HPI 74 year old male presents with generalized weakness.  History is from EMS and patient.  The patient is not a good historian though he is also not altered.  Apparently he was found on the ground and has been feeling weak.  He has chronic shoulder problems and states he has been having fluid drained off of his shoulder repeatedly by orthopedics.  Most recently this was 1 to 2 weeks ago.  Subjective fever and EMS noted temperature ranging between 101 and 102. EMS gave him tylenol.   Past Medical History:  Diagnosis Date  . Anemia    NOS  . Anxiety   . 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 BPH (benign prostatic hypertrophy)   . Broken foot Oct. 12, 2013   Right foot Fx  . CAD (coronary artery disease)   . Carotid stenosis, left   . Cellulitis and abscess of leg 03/2018   right leg  . Chronic hyponatremia   . Chronic pain syndrome   . COPD (chronic obstructive pulmonary disease) ([place])   . Depression   . Diabetes mellitus   . Diabetic foot ulcer ([place])   . DJD (degenerative joint disease)    L wrist  . DM2 (diabetes mellitus, type 2) ([place])   . GERD (gastroesophageal reflux disease)   . HLD (hyperlipidemia)   . HTN (hypertension)   . Hx of blood transfusion reaction   . Hyponatremia   . Lumbar disc disease   . MI (mitral incompetence)   . Myocardial infarction ([place])    x 2  . Osteoporosis    tx per [facility], Dr Prudencio Burly, thought due to heavy steriod use after 1978  . Peripheral neuropathy   . Peripheral vascular disease ([place])   . Restless leg syndrome   . Spine fracture    hx, multiple  . Varicose veins   . 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 Vitamin D deficiency     Patient Active Problem List     Diagnosis Date Noted  . Pressure injury of skin 05/12/2020  . Amputation of right middle finger 05/12/2020  . Long term (current) use of opiate analgesic 05/07/2020  . Pain in left foot 05/07/2020  . Neck pain 05/07/2020  . Chronic systolic congestive heart failure, NYHA class 3 ([place]) 01/08/2020  . Constipation 01/07/2020  . CAD (coronary artery disease) 01/03/2020  . Hyperlipidemia associated with type 2 diabetes mellitus ([place]) 01/03/2020  . S/P coronary artery stent placement 01/03/2020  . Major depressive disorder, recurrent episode, severe, with psychotic behavior ([place]) 12/29/2019  . Delusional disorder ([place]) 12/29/2019  . NSTEMI (non-ST elevated myocardial infarction) ([place]) 05/19/2019  . MRSA infection 01/18/2019  . Occipital neuralgia 01/18/2019  . Osteomyelitis of left foot ([place]) 01/18/2019  . Type 2 diabetes mellitus with diabetic peripheral angiopathy without gangrene, without long-term current use of insulin ([place]) 01/18/2019  . Acute on chronic diastolic congestive heart failure ([place]) 01/15/2019  . Varus foot deformity, acquired, left 11/28/2018  . Generalized anxiety disorder 07/03/2018  . Restless leg syndrome 07/03/2018  . Paranoia ([place]) 07/03/2018  . PTSD (post-traumatic stress disorder) 05/29/2018  . Abscess of right lower leg   .  Severe protein-calorie malnutrition ([place])   . 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 Diabetic polyneuropathy associated with type 2 diabetes mellitus ([place])   . Cellulitis 04/01/2018  . Cellulitis of right lower extremity 04/01/2018  . Tachycardia 04/01/2018  . 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 Unstable angina ([place]) 09/20/2015  . Chest pain, rule out acute myocardial infarction 09/18/2015  . Diabetes mellitus type 2 with peripheral artery disease ([place]) 09/18/2015  . Hyponatremia 09/18/2015  . Chest pain on exertion 09/18/2015  . Varicose veins of bilateral lower extremities with other complications  19/41/7408  . Occlusion and stenosis of carotid artery without mention of cerebral infarction 10/18/2011  . SINUSITIS, ACUTE 03/13/2010  . BACK PAIN 11/13/2009  . FATIGUE 10/17/2009  . Diabetic neuropathy associated with diabetes mellitus due to underlying condition ([place]) 08/29/2009  . Vitamin D deficiency 08/29/2009  . Hyposmolality and/or hyponatremia 08/29/2009  . VISUAL ACUITY, DECREASED, LEFT EYE 08/29/2009  . PERIPHERAL VASCULAR DISEASE 08/29/2009  . SINUSITIS, CHRONIC 08/29/2009  . NEPHROLITHIASIS 08/29/2009  . FIBROMYALGIA 08/29/2009  . BICEPS TENDON RUPTURE, RIGHT 08/11/2009  . DIABETES MELLITUS, TYPE II 08/05/2009  . Hyperlipidemia 08/05/2009  . ANEMIA-NOS 08/05/2009  . ANXIETY 08/05/2009  . DEPRESSION 08/05/2009  . Chronic pain syndrome 08/05/2009  . Essential hypertension 08/05/2009  . GERD 08/05/2009  . BENIGN PROSTATIC HYPERTROPHY 08/05/2009  . [place] DISEASE, LUMBAR 08/05/2009  . Osteoporosis 08/05/2009    Past Surgical History:  Procedure Laterality Date  . ANGIOPLASTY    . aorta bifemoral bypass grafting  09/2010  . CARDIAC CATHETERIZATION N/A 09/19/2015   Procedure: Left Heart Cath and Coronary Angiography;  Surgeon: Adrian Prows, MD;  Location: [facility];  Service: Cardiovascular;  Laterality: N/A;  . CARPAL TUNNEL RELEASE     right 2006/ left 2007  . CATARACT EXTRACTION     left 1996/ right 1997  . CORONARY ARTERY BYPASS GRAFT N/A 09/22/2015   Procedure: CORONARY ARTERY BYPASS GRAFTING (CABG) times four using the right greater saphenous vein harvested endoscopically and the left internal mammary artery.  LIMA-LAD, SEQ SVG-DIAG & OM, SVG-PD.;  Surgeon: [name], MD;  Location: [facility];  Service: Open Heart Surgery;  Laterality: N/A;  . I & D EXTREMITY Right 04/07/2018   Procedure: IRRIGATION AND DEBRIDEMENT ABSCESS RIGHT LEG;  Surgeon: Newt Minion, MD;  Location: [facility];  Service: Orthopedics;  Laterality: Right;  . INCISION AND DRAINAGE OF WOUND  Right 05/07/2020   Procedure: RIGHT LONG FINGER IRRIGATION AND DEBRIDEMENT AND AMPUTATION;  Surgeon: Iran Planas, MD;  Location: [facility];  Service: Orthopedics;  Laterality: Right;  with IV sedation  . INCISION AND DRAINAGE OF WOUND Right 05/12/2020   Procedure: IRRIGATION AND DEBRIDEMENT WOUND and possible revision amputation;  Surgeon: Iran Planas, MD;  Location: [facility];  Service: Orthopedics;  Laterality: Right;  needs 60 minutes  . L foot open repair jones fracture  2010    5th metetarsal   . left ankle ganglion cyst  1976  . left carotid endarterectomy  03/2011  . left cataract  1996   right - 1997  . left CTS  2006   R CTS - 2007  . left foot surgery  1998   R surgery/fracture - 1999  . left plantar ganglion cystectomy  1979  . left wrist/hand fusion  2008  . [place], 2004  . NASAL SINUS SURGERY     multiple- x8. last 1997  with obliteration  . REPAIR THORACIC AORTA  2017  . right hand fracture  1969  . ROTATOR CUFF REPAIR  2006   R, than repeat 2011, Dr Theda Sers  . TRIGGER FINGER RELEASE Left 11/11/2014   Procedure: LEFT LONG FINGER RELEASE TRIGGER FINGER/A-1 PULLEY;  Surgeon: Milly Jakob, MD;  Location: [facility];  Service: Orthopedics;  Laterality: Left;  . vocal surgery  1996       Family History  Problem Relation Age of Onset  . Lung cancer Father 1  . Diabetes Mother 47  . Osteoporosis Mother   . Depression Mother   . Bleeding Disorder Mother   . Throat cancer Other        family hx - "bad living" - also lung CA, heart disease and renal failure    . Diabetes Sister   . Suicidality Son 71  . Lung cancer Maternal Grandmother 33  . Diabetes Maternal Grandfather 26  . Heart disease Maternal Grandfather   . Heart attack Paternal Grandmother   . Brain cancer Paternal Grandfather 84    Social History   Tobacco Use  . Smoking status: Former Smoker    Packs/day: 1.50    Years: 30.00    Pack years: 45.00    Types: Cigarettes      Quit date: 11/06/1988    Years since quitting: 31.6  . Smokeless tobacco: Never Used  Vaping Use  . Vaping Use: Never used  Substance Use Topics  . Alcohol use: Not Currently    Comment: None in 30 years but drank beer before- 05/09/20  . Drug use: No    Home Medications Prior to Admission medications   Medication Sig Start Date End Date Taking? Authorizing Provider  ACCU-CHEK FASTCLIX LANCETS MISC Use as directed to check blood glucose. Dx: E11.51 01/17/18   [name], NP  albuterol (PROAIR HFA) 108 (90 Base) MCG/ACT inhaler Inhale 2 puffs into the lungs every 6 (six) hours as needed for wheezing or shortness of breath.    [provider]  amiodarone (PACERONE) 200 MG tablet Take 200 mg by mouth daily. Patient not taking: Reported on 05/06/2020 01/11/20   [provider]  aspirin EC 81 MG tablet Take 81 mg by mouth daily.    [provider]  Blood Glucose Monitoring Suppl (ACCU-CHEK AVIVA PLUS) w/Device KIT Use as directed to check blood glucose daily. Dx: E11.51 01/17/18   [name], NP  BRILINTA 90 MG TABS tablet Take 90 mg by mouth 2 (two) times daily. 11/21/19   [provider]  buPROPion (WELLBUTRIN XL) 300 MG 24 hr tablet Take 1 tablet (300 mg total) by mouth daily. 01/25/20   Donnal Moat T, PA-C  carvedilol (COREG) 6.25 MG tablet Take 6.25 mg by mouth 2 (two) times daily. Patient not taking: Reported on 02/22/2020 11/21/19   [provider]  celecoxib (CELEBREX) 200 MG capsule TAKE 1 CAPSULE(200 MG) BY MOUTH DAILY Patient taking differently: Take 200 mg by mouth 2 (two) times daily.  10/02/18   [name], NP  Cholecalciferol (VITAMIN D3) 5000 units TABS Take 5,000 Units by mouth daily.    [provider]  clindamycin (CLEOCIN) 300 MG capsule Take 1 capsule (300 mg total) by mouth 4 (four) times daily. X 7 days 01/19/20   Malvin Johns, MD  clonazePAM (KLONOPIN) 0.5 MG tablet Take 1 tablet (0.5 mg total) by mouth  2 (two) times daily as needed for anxiety. 06/11/20   [place],  Dorothea Glassman, PA-C  desonide (DESOWEN) 0.05 % cream Apply 1 application topically daily as needed (rosacea).  Patient not taking: Reported on 05/06/2020    [provider]  diclofenac Sodium (VOLTAREN) 1 % GEL Apply 2 g topically 4 (four) times daily. Patient not taking: Reported on 05/06/2020 11/23/19   Blanchie Dessert, MD  ENTRESTO 49-51 MG Take 1 tablet by mouth 2 (two) times daily. 12/11/19   [provider]  fentaNYL (DURAGESIC) 12 MCG/HR Place 1 patch onto the skin every 3 (three) days.  12/19/19   [provider]  furosemide (LASIX) 20 MG tablet TAKE 1 TABLET BY MOUTH TWICE DAILY Patient taking differently: Take 20 mg by mouth 2 (two) times daily.  07/17/19   Adrian Prows, MD  gabapentin (NEURONTIN) 400 MG capsule 1 po q am, 1 po at lunch, 2 po qhs. Patient taking differently: Take 400-800 mg by mouth See admin instructions. Take 400 mg by mouth in the morning and at lunch and 800 mg at bedtime 03/26/20   Donnal Moat T, PA-C  glucose blood (ACCU-CHEK AVIVA PLUS) test strip 1 each by Other route as needed for other. Use as instructed. Dx: E11.51 01/17/18   [name], NP  IRON PO Take 1 capsule by mouth daily.    [provider]  isosorbide mononitrate (IMDUR) 60 MG 24 hr tablet Take 60 mg by mouth daily. 12/11/19   [provider]  JANUVIA 100 MG tablet TAKE 1 TABLET BY MOUTH EVERY DAY Patient taking differently: Take 100 mg by mouth daily.  05/19/18   [name], DO  lidocaine (LIDODERM) 5 % Place 1 patch onto the skin daily. Remove & Discard patch within 12 hours or as directed by MD Patient not taking: Reported on 02/22/2020 11/23/19   Blanchie Dessert, MD  Multiple Vitamin (MULTIVITAMIN WITH MINERALS) TABS tablet Take 1 tablet by mouth daily.    [provider]  NARCAN 4 MG/0.1ML LIQD nasal spray kit Place 4 mg into the nose as needed (opioid reversal).  06/18/19   [provider]  nitroGLYCERIN (NITROSTAT) 0.4 MG SL tablet PLACE 1 TABLET UNDER THE TONGUE EVERY 5 MINUTES AS NEEDED FOR CHEST PAIN Patient taking differently: Place 0.4 mg under the tongue every 5 (five) minutes as needed for chest pain.  07/17/19   Adrian Prows, MD  omeprazole (PRILOSEC) 40 MG capsule Take 40 mg by mouth 2 (two) times daily. 11/26/19   [provider]  oxyCODONE-acetaminophen (PERCOCET) 10-325 MG tablet Take 1 tablet by mouth in the morning and at bedtime.  12/24/19   [provider]  PRALUENT 75 MG/ML SOAJ Inject 75 mg into the skin every 14 (fourteen) days. Patient not taking: Reported on 03/26/2020 10/11/19   [provider]  pravastatin (PRAVACHOL) 20 MG tablet Take 20 mg by mouth at bedtime. Patient not taking: Reported on 02/22/2020    [provider]  rOPINIRole (REQUIP) 1 MG tablet Take 1 tablet (1 mg total) by mouth in the morning and at bedtime. 06/11/20   Donnal Moat T, PA-C  senna-docusate (SENOKOT-S) 8.6-50 MG tablet Take 2 tablets by mouth 2 (two) times daily. Patient not taking: Reported on 05/06/2020    [provider]  tamsulosin (FLOMAX) 0.4 MG CAPS capsule Take 1 capsule (0.4 mg total) by mouth 2 (two) times daily. Patient taking differently: Take 0.4 mg by mouth daily after supper.  11/16/18   [name], NP    Allergies    Ivp dye [  iodinated diagnostic agents], Penicillins, Sulfa antibiotics, Sulfonamide derivatives, Lisinopril, Metoprolol, Statins, and Propoxyphene n-acetaminophen  Review of Systems   Review of Systems  Constitutional: Positive for fever.  Cardiovascular: Negative for chest pain.  Musculoskeletal: Positive for arthralgias and joint swelling.  All other systems reviewed and are negative.   Physical Exam Updated Vital Signs BP (!) 84/61   Pulse 91   Temp 98.7 F (37.1 C) (Oral)   Resp 17   Ht 6' (1.829 m)   Wt 86.2 kg   SpO2 99%   BMI 25.77 kg/m   Physical Exam Vitals and  nursing note reviewed.  Constitutional:      Appearance: He is well-developed. He is ill-appearing. He is not diaphoretic.  HENT:     Head: Normocephalic and atraumatic.     Right Ear: External ear normal.     Left Ear: External ear normal.     Nose: Nose normal.  Eyes:     General:        Right eye: No discharge.        Left eye: No discharge.  Cardiovascular:     Rate and Rhythm: Normal rate and regular rhythm.     Heart sounds: Normal heart sounds.  Pulmonary:     Effort: Pulmonary effort is normal.     Breath sounds: Normal breath sounds.  Abdominal:     Palpations: Abdomen is soft.     Tenderness: There is no abdominal tenderness.  Musculoskeletal:     Cervical back: Neck supple.     Comments: Left shoulder with large joint effusion and diffuse cellulitis anteriorly and laterally.  Pain with range of motion. Right lower extremity is swollen compared to the left with mild erythema as well.  Skin:    General: Skin is warm and dry.  Neurological:     Mental Status: He is alert and oriented to person, place, and time.  Psychiatric:        Mood and Affect: Mood is not anxious.     ED Results / Procedures / Treatments   Labs (all labs ordered are listed, but only abnormal results are displayed) Labs Reviewed  COMPREHENSIVE METABOLIC PANEL - Abnormal; Notable for the following components:      Result Value   Sodium 124 (*)    Chloride 92 (*)    CO2 21 (*)    Glucose, Bld 219 (*)    BUN 29 (*)    Albumin 3.2 (*)    All other components within normal limits  CBC WITH DIFFERENTIAL/PLATELET - Abnormal; Notable for the following components:   WBC 11.6 (*)    RBC 3.62 (*)    Hemoglobin 10.5 (*)    HCT 32.0 (*)    RDW 16.5 (*)    Neutro Abs 9.8 (*)    Lymphs Abs 0.3 (*)    Monocytes Absolute 1.4 (*)    Abs Immature Granulocytes 0.12 (*)    All other components within normal limits  PROTIME-INR - Abnormal; Notable for the following components:   Prothrombin Time 15.3  (*)    INR 1.3 (*)    All other components within normal limits  APTT - Abnormal; Notable for the following components:   aPTT 38 (*)    All other components within normal limits  SYNOVIAL CELL COUNT + DIFF, W/ CRYSTALS - Abnormal; Notable for the following components:   Color, Synovial BROWN (*)    Appearance-Synovial TURBID (*)    WBC, Synovial 30,597 (*)  Neutrophil, Synovial 96 (*)    Monocyte-Macrophage-Synovial Fluid 1 (*)    All other components within normal limits  RESP PANEL BY RT-PCR (FLU A&B, COVID) ARPGX2  BODY FLUID CULTURE  CULTURE, BLOOD (ROUTINE X 2)  CULTURE, BLOOD (ROUTINE X 2)  URINE CULTURE  LACTIC ACID, PLASMA  LACTIC ACID, PLASMA  URINALYSIS, ROUTINE W REFLEX MICROSCOPIC  LACTIC ACID, PLASMA  C-REACTIVE PROTEIN  SEDIMENTATION RATE    EKG None  Radiology DG Chest Port 1 View  Result Date: 07/09/2020 CLINICAL DATA:  Questionable sepsis EXAM: PORTABLE CHEST 1 VIEW COMPARISON:  05/02/2020, 12/31/2019 FINDINGS: Post sternotomy changes. No focal opacity or pleural effusion. Stable cardiomediastinal silhouette. No pneumothorax. Mild diffuse coarse chronic interstitial opacity. IMPRESSION: No active disease. Electronically Signed   By: Donavan Foil M.D.   On: 07/09/2020 21:30   DG Shoulder Left Port  Result Date: 07/09/2020 CLINICAL DATA:  Left arm pain swelling and erythema from shoulder EXAM: LEFT SHOULDER COMPARISON:  05/12/2020, CT 06/18/2020 FINDINGS: Marked lateral soft tissue swelling over the shoulder, probably related to complex joint effusion noted on previous imaging. Irregular AC joint arthropathy. Erosive changes of the glenoid. Free-floating acromial fragment, suspect worsened erosive changes at the scapula. IMPRESSION: Marked lateral soft tissue swelling over the shoulder, probably related to complex joint effusion noted on previous imaging. Erosive changes of the glenoid and suspected worsened erosive changes at the scapula and AC joint. There  appears to be a free-floating acromial fragment. Findings could be secondary to inflammatory arthropathy versus infection. Findings appear progressive as compared with prior radiograph 05/02/2020. Electronically Signed   By: Donavan Foil M.D.   On: 07/09/2020 21:39    Procedures .Joint Aspiration/Arthrocentesis  Date/Time: 07/09/2020 9:57 PM Performed by: Sherwood Gambler, MD Authorized by: Sherwood Gambler, MD   Consent:    Consent obtained:  Verbal and written   Consent given by:  Patient   Risks discussed:  Bleeding, incomplete drainage, infection, nerve damage, poor cosmetic result and pain Location:    Location:  Shoulder   Shoulder:  L glenohumeral Anesthesia (see MAR for exact dosages):    Anesthesia method:  Local infiltration   Local anesthetic:  Lidocaine 2% WITH epi Procedure details:    Preparation: Patient was prepped and draped in usual sterile fashion     Needle gauge:  18 G (spinal needle)   Ultrasound guidance: yes     Approach:  Posterior   Aspirate amount:  170   Aspirate characteristics:  Purulent   Specimen collected: yes   Post-procedure details:    Dressing:  Adhesive bandage   Patient tolerance of procedure:  Tolerated well, no immediate complications  .Critical Care Performed by: Sherwood Gambler, MD Authorized by: Sherwood Gambler, MD   Critical care provider statement:    Critical care time (minutes):  50   Critical care time was exclusive of:  Separately billable procedures and treating other patients   Critical care was necessary to treat or prevent imminent or life-threatening deterioration of the following conditions:  Sepsis   Critical care was time spent personally by me on the following activities:  Discussions with consultants, evaluation of patient's response to treatment, examination of patient, ordering and performing treatments and interventions, ordering and review of laboratory studies, ordering and review of radiographic studies, pulse  oximetry, re-evaluation of patient's condition, obtaining history from patient or surrogate and review of old charts   (including critical care time)     Medications Ordered in ED Medications  vancomycin (VANCOREADY) IVPB  1750 mg/350 mL (1,750 mg Intravenous New Bag/Given 07/09/20 2129)  lactated ringers bolus 1,000 mL (0 mLs Intravenous Stopped 07/09/20 2240)    And  lactated ringers bolus 1,000 mL (0 mLs Intravenous Stopped 07/09/20 2240)    And  lactated ringers bolus 1,000 mL (1,000 mLs Intravenous New Bag/Given 07/09/20 2240)  cefTRIAXone (ROCEPHIN) 2 g in sodium chloride 0.9 % 100 mL IVPB (0 g Intravenous Stopped 07/09/20 2209)  lidocaine-EPINEPHrine (XYLOCAINE W/EPI) 2 %-1:200000 (PF) injection 10 mL (10 mLs Infiltration Given 07/09/20 2157)    ED Course  I have reviewed the triage vital signs and the nursing notes.  Pertinent labs & imaging results that were available during my care of the patient were reviewed by me and considered in my medical decision making (see chart for details).    MDM Rules/Calculators/A&P                          Patient presents with sepsis.  He is not febrile here but was given Tylenol by EMS for a fever on their evaluation.  The patient is awake and alert and oriented but seems slightly confused as to what exactly happened today.  His left arm is cellulitic although his right leg also is a little warm and red.  After discussion with patient and his known joint effusion in the setting of sepsis, we discussed needing to get fluid off.  While his arm is cellulitic, posteriorly there is a clear area that was cleaned and aspirated as above.  Appears purulent.  I discussed with Dr. Alvan Dame of orthopedics.  They will consult.  Discussed with Dr. Halford Chessman of the ICU for admission as he is remaining borderline hypotensive with BP in the 80s but MAP of around 70.  He is sleepy but maintaining airway and easily awakened. Final Clinical Impression(s) / ED  Diagnoses Final diagnoses:  Septic shock ([place])  Pyogenic arthritis of left shoulder region, due to unspecified organism [facility])    Rx / DC Orders ED Discharge Orders    None       Sherwood Gambler, MD 07/09/20 2318

## 2020-07-10 ENCOUNTER — Encounter (HOSPITAL_COMMUNITY): Payer: Medicare Other

## 2020-07-10 ENCOUNTER — Encounter (HOSPITAL_COMMUNITY)
Admission: EM | Disposition: A | Discharge status: Home Health | Payer: Self-pay | Source: Home / Self Care | Attending: Internal Medicine

## 2020-07-10 ENCOUNTER — Inpatient Hospital Stay (HOSPITAL_COMMUNITY): Payer: Medicare Other | Admitting: Certified Registered"

## 2020-07-10 ENCOUNTER — Inpatient Hospital Stay (HOSPITAL_COMMUNITY): Payer: Medicare Other

## 2020-07-10 DIAGNOSIS — R600 Localized edema: Secondary | ICD-10-CM

## 2020-07-10 DIAGNOSIS — I5022 Chronic systolic (congestive) heart failure: Secondary | ICD-10-CM

## 2020-07-10 DIAGNOSIS — I340 Nonrheumatic mitral (valve) insufficiency: Secondary | ICD-10-CM

## 2020-07-10 DIAGNOSIS — R0600 Dyspnea, unspecified: Secondary | ICD-10-CM

## 2020-07-10 DIAGNOSIS — I2510 Atherosclerotic heart disease of native coronary artery without angina pectoris: Secondary | ICD-10-CM

## 2020-07-10 DIAGNOSIS — R6521 Severe sepsis with septic shock: Secondary | ICD-10-CM

## 2020-07-10 DIAGNOSIS — A4189 Other specified sepsis: Secondary | ICD-10-CM

## 2020-07-10 DIAGNOSIS — M009 Pyogenic arthritis, unspecified: Secondary | ICD-10-CM

## 2020-07-10 DIAGNOSIS — B954 Other streptococcus as the cause of diseases classified elsewhere: Secondary | ICD-10-CM

## 2020-07-10 DIAGNOSIS — I255 Ischemic cardiomyopathy: Secondary | ICD-10-CM

## 2020-07-10 HISTORY — PX: SHOULDER ARTHROSCOPY WITH LABRAL REPAIR: SHX5691

## 2020-07-10 LAB — BLOOD CULTURE ID PANEL (REFLEXED) - BCID2

## 2020-07-10 LAB — CBC
HCT: 29.5 % — ABNORMAL LOW (ref 39.0–52.0)
Hemoglobin: 9.7 g/dL — ABNORMAL LOW (ref 13.0–17.0)
MCH: 29.8 pg (ref 26.0–34.0)
MCHC: 32.9 g/dL (ref 30.0–36.0)
MCV: 90.8 fL (ref 80.0–100.0)
Platelets: 311 10*3/uL (ref 150–400)
RBC: 3.25 MIL/uL — ABNORMAL LOW (ref 4.22–5.81)
RDW: 16.8 % — ABNORMAL HIGH (ref 11.5–15.5)
WBC: 10.1 10*3/uL (ref 4.0–10.5)
nRBC: 0 % (ref 0.0–0.2)

## 2020-07-10 LAB — COMPREHENSIVE METABOLIC PANEL
ALT: 13 U/L (ref 0–44)
AST: 15 U/L (ref 15–41)
Albumin: 2.6 g/dL — ABNORMAL LOW (ref 3.5–5.0)
Alkaline Phosphatase: 106 U/L (ref 38–126)
Anion gap: 9 (ref 5–15)
BUN: 23 mg/dL (ref 8–23)
CO2: 20 mmol/L — ABNORMAL LOW (ref 22–32)
Calcium: 8.2 mg/dL — ABNORMAL LOW (ref 8.9–10.3)
Chloride: 99 mmol/L (ref 98–111)
Creatinine, Ser: 0.79 mg/dL (ref 0.61–1.24)
GFR, Estimated: >60 mL/min (ref >60)
Glucose, Bld: 107 mg/dL — ABNORMAL HIGH (ref 70–99)
Potassium: 4.1 mmol/L (ref 3.5–5.1)
Sodium: 128 mmol/L — ABNORMAL LOW (ref 135–145)
Total Bilirubin: 0.6 mg/dL (ref 0.3–1.2)
Total Protein: 6.2 g/dL — ABNORMAL LOW (ref 6.5–8.1)

## 2020-07-10 LAB — GLUCOSE, CAPILLARY
Glucose-Capillary: 111 mg/dL — ABNORMAL HIGH (ref 70–99)
Glucose-Capillary: 150 mg/dL — ABNORMAL HIGH (ref 70–99)
Glucose-Capillary: 155 mg/dL — ABNORMAL HIGH (ref 70–99)
Glucose-Capillary: 163 mg/dL — ABNORMAL HIGH (ref 70–99)
Glucose-Capillary: 173 mg/dL — ABNORMAL HIGH (ref 70–99)
Glucose-Capillary: 91 mg/dL (ref 70–99)

## 2020-07-10 LAB — ECHOCARDIOGRAM COMPLETE
Area-P 1/2: 5.62 cm2
Calc EF: 49 %
Height: 72 in
Single Plane A2C EF: 43.2 %
Single Plane A4C EF: 54.5 %
Weight: 3262.81 oz

## 2020-07-10 LAB — CBG MONITORING, ED: Glucose-Capillary: 213 mg/dL — ABNORMAL HIGH (ref 70–99)

## 2020-07-10 LAB — URINALYSIS, ROUTINE W REFLEX MICROSCOPIC
Bacteria, UA: NONE SEEN
Bilirubin Urine: NEGATIVE
Glucose, UA: NEGATIVE mg/dL
Ketones, ur: NEGATIVE mg/dL
Leukocytes,Ua: NEGATIVE
Nitrite: NEGATIVE
Protein, ur: 30 mg/dL — AB
Specific Gravity, Urine: 1.015 (ref 1.005–1.030)
pH: 5 (ref 5.0–8.0)

## 2020-07-10 LAB — TSH: TSH: 0.996 u[IU]/mL (ref 0.350–4.500)

## 2020-07-10 LAB — SEDIMENTATION RATE: Sed Rate: 150 mm/hr — ABNORMAL HIGH (ref 0–16)

## 2020-07-10 LAB — CORTISOL: Cortisol, Plasma: 29.3 ug/dL

## 2020-07-10 LAB — MRSA PCR SCREENING: MRSA by PCR: NEGATIVE

## 2020-07-10 SURGERY — ARTHROSCOPY, SHOULDER, WITH GLENOID LABRUM REPAIR
Anesthesia: General | Site: Shoulder | Laterality: Left

## 2020-07-10 MED ORDER — MAGNESIUM CITRATE PO SOLN: 1.0000 | Freq: Once | ORAL | Status: DC | PRN

## 2020-07-10 MED ORDER — METOCLOPRAMIDE HCL 5 MG PO TABS: 5.0000 mg | ORAL_TABLET | Freq: Three times a day (TID) | ORAL | Status: DC | PRN

## 2020-07-10 MED ORDER — ASPIRIN EC 81 MG PO TBEC: 81.0000 mg | DELAYED_RELEASE_TABLET | Freq: Every day | ORAL | Status: DC

## 2020-07-10 MED ORDER — ROPINIROLE HCL 1 MG PO TABS
1.0000 mg | ORAL_TABLET | Freq: Three times a day (TID) | ORAL | Status: DC
  Filled 2020-07-10: qty 1

## 2020-07-10 MED ORDER — MENTHOL 3 MG MT LOZG: 1.0000 | LOZENGE | OROMUCOSAL | Status: DC | PRN

## 2020-07-10 MED ORDER — OXYCODONE HCL 5 MG PO TABS
5.0000 mg | ORAL_TABLET | Freq: Four times a day (QID) | ORAL | Status: DC | PRN
  Administered 2020-07-10 (×2): 10 mg via ORAL
  Administered 2020-07-10: 5 mg via ORAL
  Administered 2020-07-11 (×2): 10 mg via ORAL
  Administered 2020-07-14 – 2020-07-15 (×4): 5 mg via ORAL
  Administered 2020-07-16 (×2): 10 mg via ORAL
  Administered 2020-07-16: 5 mg via ORAL
  Administered 2020-07-16 – 2020-07-22 (×16): 10 mg via ORAL
  Filled 2020-07-10 (×5): qty 2
  Filled 2020-07-10 (×2): qty 1
  Filled 2020-07-10 (×6): qty 2
  Filled 2020-07-10: qty 1
  Filled 2020-07-10 (×4): qty 2
  Filled 2020-07-10: qty 1
  Filled 2020-07-10 (×7): qty 2
  Filled 2020-07-10: qty 1
  Filled 2020-07-10: qty 2

## 2020-07-10 MED ORDER — ROCURONIUM BROMIDE 10 MG/ML (PF) SYRINGE
PREFILLED_SYRINGE | INTRAVENOUS | Status: AC
  Filled 2020-07-10: qty 10

## 2020-07-10 MED ORDER — TICAGRELOR 90 MG PO TABS
90.0000 mg | ORAL_TABLET | Freq: Two times a day (BID) | ORAL | Status: DC
  Administered 2020-07-11 – 2020-07-22 (×22): 90 mg via ORAL
  Filled 2020-07-10 (×24): qty 1

## 2020-07-10 MED ORDER — SODIUM CHLORIDE 0.9 % IR SOLN
Status: DC | PRN
  Administered 2020-07-10: 12000 mL

## 2020-07-10 MED ORDER — POLYETHYLENE GLYCOL 3350 17 G PO PACK: 17.0000 g | PACK | Freq: Every day | ORAL | Status: DC | PRN

## 2020-07-10 MED ORDER — VANCOMYCIN HCL IN DEXTROSE 1-5 GM/200ML-% IV SOLN
1000.0000 mg | Freq: Two times a day (BID) | INTRAVENOUS | Status: DC
  Administered 2020-07-10 – 2020-07-11 (×3): 1000 mg via INTRAVENOUS
  Filled 2020-07-10 (×3): qty 200

## 2020-07-10 MED ORDER — ONDANSETRON HCL 4 MG/2ML IJ SOLN
INTRAMUSCULAR | Status: DC | PRN
  Administered 2020-07-10: 4 mg via INTRAVENOUS

## 2020-07-10 MED ORDER — TAMSULOSIN HCL 0.4 MG PO CAPS
0.4000 mg | ORAL_CAPSULE | Freq: Every day | ORAL | Status: DC
  Administered 2020-07-10 – 2020-07-11 (×2): 0.4 mg via ORAL
  Filled 2020-07-10 (×2): qty 1

## 2020-07-10 MED ORDER — SODIUM CHLORIDE 0.9 % IV SOLN
2.0000 g | Freq: Three times a day (TID) | INTRAVENOUS | Status: DC
  Administered 2020-07-10 – 2020-07-11 (×3): 2 g via INTRAVENOUS
  Filled 2020-07-10 (×3): qty 2

## 2020-07-10 MED ORDER — HYDROMORPHONE HCL 1 MG/ML IJ SOLN
0.2500 mg | INTRAMUSCULAR | Status: DC | PRN
  Administered 2020-07-10: 0.25 mg via INTRAVENOUS

## 2020-07-10 MED ORDER — OXYCODONE HCL 5 MG PO TABS: 5.0000 mg | ORAL_TABLET | Freq: Once | ORAL | Status: DC | PRN

## 2020-07-10 MED ORDER — ALBUTEROL SULFATE (2.5 MG/3ML) 0.083% IN NEBU
2.5000 mg | INHALATION_SOLUTION | RESPIRATORY_TRACT | Status: DC | PRN
  Administered 2020-07-12: 2.5 mg via RESPIRATORY_TRACT
  Filled 2020-07-10: qty 3

## 2020-07-10 MED ORDER — TICAGRELOR 90 MG PO TABS: 90.0000 mg | ORAL_TABLET | Freq: Two times a day (BID) | ORAL | Status: DC

## 2020-07-10 MED ORDER — LACTATED RINGERS IV SOLN: INTRAVENOUS | Status: DC | PRN

## 2020-07-10 MED ORDER — PROPOFOL 10 MG/ML IV BOLUS
INTRAVENOUS | Status: DC | PRN
  Administered 2020-07-10: 100 mg via INTRAVENOUS

## 2020-07-10 MED ORDER — ROPINIROLE HCL 1 MG PO TABS
1.0000 mg | ORAL_TABLET | Freq: Three times a day (TID) | ORAL | Status: DC
  Administered 2020-07-10 – 2020-07-22 (×35): 1 mg via ORAL
  Filled 2020-07-10 (×37): qty 1

## 2020-07-10 MED ORDER — FENTANYL CITRATE (PF) 250 MCG/5ML IJ SOLN
INTRAMUSCULAR | Status: DC | PRN
  Administered 2020-07-10 (×2): 50 ug via INTRAVENOUS

## 2020-07-10 MED ORDER — DOCUSATE SODIUM 100 MG PO CAPS
100.0000 mg | ORAL_CAPSULE | Freq: Two times a day (BID) | ORAL | Status: DC
  Administered 2020-07-10 – 2020-07-22 (×23): 100 mg via ORAL
  Filled 2020-07-10 (×23): qty 1

## 2020-07-10 MED ORDER — DEXTROSE 50 % IV SOLN: 12.5000 g | INTRAVENOUS | Status: DC

## 2020-07-10 MED ORDER — ONDANSETRON HCL 4 MG/2ML IJ SOLN
INTRAMUSCULAR | Status: AC
  Filled 2020-07-10: qty 2

## 2020-07-10 MED ORDER — ORAL CARE MOUTH RINSE
15.0000 mL | Freq: Two times a day (BID) | OROMUCOSAL | Status: DC
  Administered 2020-07-10 – 2020-07-17 (×14): 15 mL via OROMUCOSAL

## 2020-07-10 MED ORDER — PROMETHAZINE HCL 25 MG/ML IJ SOLN: 6.2500 mg | INTRAMUSCULAR | Status: DC | PRN

## 2020-07-10 MED ORDER — DIPHENHYDRAMINE HCL 12.5 MG/5ML PO ELIX: 12.5000 mg | ORAL_SOLUTION | ORAL | Status: DC | PRN

## 2020-07-10 MED ORDER — PROPOFOL 10 MG/ML IV BOLUS
INTRAVENOUS | Status: AC
  Filled 2020-07-10: qty 20

## 2020-07-10 MED ORDER — DEXAMETHASONE SODIUM PHOSPHATE 10 MG/ML IJ SOLN
INTRAMUSCULAR | Status: AC
  Filled 2020-07-10: qty 1

## 2020-07-10 MED ORDER — FENTANYL CITRATE (PF) 100 MCG/2ML IJ SOLN
INTRAMUSCULAR | Status: AC
  Filled 2020-07-10: qty 2

## 2020-07-10 MED ORDER — ONDANSETRON HCL 4 MG PO TABS
4.0000 mg | ORAL_TABLET | Freq: Four times a day (QID) | ORAL | Status: DC | PRN
  Administered 2020-07-14: 4 mg via ORAL
  Filled 2020-07-10: qty 1

## 2020-07-10 MED ORDER — ASPIRIN EC 81 MG PO TBEC
81.0000 mg | DELAYED_RELEASE_TABLET | Freq: Every day | ORAL | Status: DC
  Administered 2020-07-10 – 2020-07-22 (×12): 81 mg via ORAL
  Filled 2020-07-10 (×12): qty 1

## 2020-07-10 MED ORDER — LACTATED RINGERS IV SOLN: INTRAVENOUS | Status: DC

## 2020-07-10 MED ORDER — FENTANYL CITRATE (PF) 100 MCG/2ML IJ SOLN
25.0000 ug | INTRAMUSCULAR | Status: DC | PRN
  Administered 2020-07-10 (×2): 25 ug via INTRAVENOUS
  Administered 2020-07-10: 50 ug via INTRAVENOUS
  Administered 2020-07-12: 25 ug via INTRAVENOUS
  Filled 2020-07-10 (×5): qty 2

## 2020-07-10 MED ORDER — OXYCODONE HCL 5 MG/5ML PO SOLN: 5.0000 mg | Freq: Once | ORAL | Status: DC | PRN

## 2020-07-10 MED ORDER — AMISULPRIDE (ANTIEMETIC) 5 MG/2ML IV SOLN: 10.0000 mg | Freq: Once | INTRAVENOUS | Status: DC | PRN

## 2020-07-10 MED ORDER — METOCLOPRAMIDE HCL 5 MG/ML IJ SOLN: 5.0000 mg | Freq: Three times a day (TID) | INTRAMUSCULAR | Status: DC | PRN

## 2020-07-10 MED ORDER — ROCURONIUM BROMIDE 10 MG/ML (PF) SYRINGE
PREFILLED_SYRINGE | INTRAVENOUS | Status: DC | PRN
  Administered 2020-07-10: 30 mg via INTRAVENOUS

## 2020-07-10 MED ORDER — ONDANSETRON HCL 4 MG/2ML IJ SOLN
4.0000 mg | Freq: Four times a day (QID) | INTRAMUSCULAR | Status: DC | PRN
  Administered 2020-07-12 – 2020-07-14 (×4): 4 mg via INTRAVENOUS
  Filled 2020-07-10 (×4): qty 2

## 2020-07-10 MED ORDER — SUGAMMADEX SODIUM 200 MG/2ML IV SOLN
INTRAVENOUS | Status: DC | PRN
  Administered 2020-07-10: 200 mg via INTRAVENOUS

## 2020-07-10 MED ORDER — HYDROMORPHONE HCL 1 MG/ML IJ SOLN
INTRAMUSCULAR | Status: AC
  Filled 2020-07-10: qty 1

## 2020-07-10 MED ORDER — BISACODYL 5 MG PO TBEC: 5.0000 mg | DELAYED_RELEASE_TABLET | Freq: Every day | ORAL | Status: DC | PRN

## 2020-07-10 MED ORDER — LIDOCAINE 2% (20 MG/ML) 5 ML SYRINGE
INTRAMUSCULAR | Status: DC | PRN
  Administered 2020-07-10: 80 mg via INTRAVENOUS

## 2020-07-10 MED ORDER — 0.9 % SODIUM CHLORIDE (POUR BTL) OPTIME
TOPICAL | Status: DC | PRN
  Administered 2020-07-10: 1000 mL

## 2020-07-10 MED ORDER — PHENOL 1.4 % MT LIQD: 1.0000 | OROMUCOSAL | Status: DC | PRN

## 2020-07-10 MED ORDER — PHENYLEPHRINE HCL (PRESSORS) 10 MG/ML IV SOLN
INTRAVENOUS | Status: AC
  Filled 2020-07-10: qty 1

## 2020-07-10 MED ORDER — PERFLUTREN LIPID MICROSPHERE
1.0000 mL | INTRAVENOUS | Status: AC | PRN
  Administered 2020-07-10: 2 mL via INTRAVENOUS
  Filled 2020-07-10: qty 10

## 2020-07-10 SURGICAL SUPPLY — 54 items
BENZOIN TINCTURE PRP APPL 2/3 (GAUZE/BANDAGES/DRESSINGS) ×3 IMPLANT
BLADE SURG SZ11 CARB STEEL (BLADE) ×3 IMPLANT
BNDG COHESIVE 4X5 TAN STRL (GAUZE/BANDAGES/DRESSINGS) ×3 IMPLANT
BOOTIES KNEE HIGH SLOAN (MISCELLANEOUS) ×6 IMPLANT
BURR OVAL 8 FLU 4.0MM X 13CM (MISCELLANEOUS) ×1
BURR OVAL 8 FLU 4.0X13 (MISCELLANEOUS) ×2 IMPLANT
CANNULA ACUFO 5X76 (CANNULA) ×9 IMPLANT
CANNULA TWIST IN 8.25X7CM (CANNULA) ×3 IMPLANT
CHLORAPREP W/TINT 26 (MISCELLANEOUS) ×3 IMPLANT
CLOSURE WOUND 1/2 X4 (GAUZE/BANDAGES/DRESSINGS) ×1
COOLER ICEMAN CLASSIC (MISCELLANEOUS) IMPLANT
COVER BACK TABLE 60X90IN (DRAPES) ×3 IMPLANT
COVER MAYO STAND STRL (DRAPES) ×3 IMPLANT
COVER SURGICAL LIGHT HANDLE (MISCELLANEOUS) ×3 IMPLANT
COVER WAND RF STERILE (DRAPES) IMPLANT
DECANTER SPIKE VIAL GLASS SM (MISCELLANEOUS) ×3 IMPLANT
DRAPE INCISE 23X17 IOBAN STRL (DRAPES) ×4
DRAPE INCISE IOBAN 23X17 STRL (DRAPES) ×2 IMPLANT
DRAPE ORTHO 2.5IN SPLIT 77X108 (DRAPES) ×2 IMPLANT
DRAPE ORTHO SPLIT 77X108 STRL (DRAPES) ×4
DRAPE SHOULDER BEACH CHAIR (DRAPES) ×3 IMPLANT
DRAPE SURG 17X11 SM STRL (DRAPES) ×3 IMPLANT
DRAPE U-SHAPE 47X51 STRL (DRAPES) ×3 IMPLANT
DRSG PAD ABDOMINAL 8X10 ST (GAUZE/BANDAGES/DRESSINGS) ×3 IMPLANT
EVACUATOR 1/8 PVC DRAIN (DRAIN) ×3 IMPLANT
GAUZE 4X4 16PLY RFD (DISPOSABLE) ×3 IMPLANT
GAUZE SPONGE 4X4 12PLY STRL (GAUZE/BANDAGES/DRESSINGS) ×3 IMPLANT
GLOVE BIO SURGEON STRL SZ7.5 (GLOVE) ×3 IMPLANT
GLOVE BIO SURGEON STRL SZ8 (GLOVE) ×3 IMPLANT
GLOVE ECLIPSE 7.5 STRL STRAW (GLOVE) ×3 IMPLANT
GLOVE EUDERMIC 7 POWDERFREE (GLOVE) ×3 IMPLANT
KIT BASIN OR (CUSTOM PROCEDURE TRAY) ×3 IMPLANT
KIT SHOULDER TRACTION (DRAPES) ×3 IMPLANT
KIT TURNOVER KIT A (KITS) IMPLANT
MANIFOLD NEPTUNE II (INSTRUMENTS) ×6 IMPLANT
NDL SAFETY ECLIPSE 18X1.5 (NEEDLE) ×1 IMPLANT
NEEDLE HYPO 18GX1.5 SHARP (NEEDLE) ×2
NEEDLE SPNL 18GX3.5 QUINCKE PK (NEEDLE) ×3 IMPLANT
PACK BASIC VI WITH GOWN DISP (CUSTOM PROCEDURE TRAY) ×3 IMPLANT
PACK SHOULDER (CUSTOM PROCEDURE TRAY) ×3 IMPLANT
PAD COLD SHLDR WRAP-ON (PAD) IMPLANT
PROBE APOLLO 90XL (SURGICAL WAND) ×3 IMPLANT
PROTECTOR NERVE ULNAR (MISCELLANEOUS) ×3 IMPLANT
RESTRAINT HEAD UNIVERSAL NS (MISCELLANEOUS) IMPLANT
SLING ARM IMMOBILIZER LRG (SOFTGOODS) IMPLANT
SLING ARM IMMOBILIZER MED (SOFTGOODS) IMPLANT
STRIP CLOSURE SKIN 1/2X4 (GAUZE/BANDAGES/DRESSINGS) ×2 IMPLANT
SUT ETHILON 2 0 PS N (SUTURE) ×3 IMPLANT
SUT MNCRL AB 3-0 PS2 18 (SUTURE) ×3 IMPLANT
SUT PDS AB 1 CT1 27 (SUTURE) ×3 IMPLANT
SYR 30ML LL (SYRINGE) ×3 IMPLANT
TUBING ARTHROSCOPY IRRIG 16FT (MISCELLANEOUS) ×3 IMPLANT
TUBING CONNECTING 10 (TUBING) ×2 IMPLANT
TUBING CONNECTING 10' (TUBING) ×1

## 2020-07-10 NOTE — Procedures (Signed)
Arterial Catheter Insertion Procedure Note  Cael Worth  [id number]  08/27/1945  Date:07/10/20  Time:3:59 AM    Provider Performing: Rosann Auerbach    Procedure: Insertion of Arterial Line [id number]) without US guidance  Indication(s) Blood pressure monitoring and/or need for frequent ABGs  Consent Risks of the procedure as well as the alternatives and risks of each were explained to the patient and/or caregiver.  Consent for the procedure was obtained and is signed in the bedside chart  Anesthesia None   Time Out Verified patient identification, verified procedure, site/side was marked, verified correct patient position, special equipment/implants available, medications/allergies/relevant history reviewed, required imaging and test results available.   Sterile Technique Maximal sterile technique including full sterile barrier drape, hand hygiene, sterile gown, sterile gloves, mask, hair covering, sterile ultrasound probe cover (if used).   Procedure Description Area of catheter insertion was cleaned with chlorhexidine and draped in sterile fashion. Without real-time ultrasound guidance an arterial catheter was placed into the right radial artery.  Appropriate arterial tracings confirmed on monitor.     Complications/Tolerance None; patient tolerated the procedure well.   EBL Minimal   Specimen(s) None

## 2020-07-10 NOTE — Progress Notes (Signed)
Following for Code Sepsis  

## 2020-07-10 NOTE — Progress Notes (Signed)
[facility] Progress Note Patient Name: [name] DOB: 12/23/1945 MRN: [id number]   Date of Service  07/10/2020  HPI/Events of Note  Patient with urinary retention.  eICU Interventions  In / Out Foley catheter protocol ordered.        Kerry Kass Ekta Dancer 07/10/2020, 10:45 PM

## 2020-07-10 NOTE — Progress Notes (Signed)
PHARMACY - PHYSICIAN COMMUNICATION CRITICAL VALUE ALERT - BLOOD CULTURE IDENTIFICATION (BCID)  [name] is an 74 y.o. male who presented to [facility] on 07/09/2020 with a chief complaint of left shoulder pain, fevers, and hypotension. Patient was found to have sepsis related to left shoulder infection s/p debridement.   Assessment:  GBS bacteremia pos  Name of physician (or Provider) Contacted: Jule Ser  Current antibiotics: vancomycin and cefepime  Changes to prescribed antibiotics recommended:  Narrow to PCN G vs CTX? For now, will continue with broad-spectrum therapy per ID and continue to watch synovial fluid cultures. F/U culture results, opportunities for narrowing.   Results for orders placed or performed during the hospital encounter of 07/09/20  Blood Culture ID Panel (Reflexed) (Collected: 07/09/2020  9:05 PM)  Result Value Ref Range   Enterococcus faecalis NOT DETECTED NOT DETECTED   Enterococcus Faecium NOT DETECTED NOT DETECTED   Listeria monocytogenes NOT DETECTED NOT DETECTED   Staphylococcus species NOT DETECTED NOT DETECTED   Staphylococcus aureus (BCID) NOT DETECTED NOT DETECTED   Staphylococcus epidermidis NOT DETECTED NOT DETECTED   Staphylococcus lugdunensis NOT DETECTED NOT DETECTED   Streptococcus species DETECTED (A) NOT DETECTED   Streptococcus agalactiae DETECTED (A) NOT DETECTED   Streptococcus pneumoniae NOT DETECTED NOT DETECTED   Streptococcus pyogenes NOT DETECTED NOT DETECTED   A.calcoaceticus-baumannii NOT DETECTED NOT DETECTED   Bacteroides fragilis NOT DETECTED NOT DETECTED   Enterobacterales NOT DETECTED NOT DETECTED   Enterobacter cloacae complex NOT DETECTED NOT DETECTED   Escherichia coli NOT DETECTED NOT DETECTED   Klebsiella aerogenes NOT DETECTED NOT DETECTED   Klebsiella oxytoca NOT DETECTED NOT DETECTED   Klebsiella pneumoniae NOT DETECTED NOT DETECTED   Proteus species NOT DETECTED NOT DETECTED   Salmonella species NOT  DETECTED NOT DETECTED   Serratia marcescens NOT DETECTED NOT DETECTED   Haemophilus influenzae NOT DETECTED NOT DETECTED   Neisseria meningitidis NOT DETECTED NOT DETECTED   Pseudomonas aeruginosa NOT DETECTED NOT DETECTED   Stenotrophomonas maltophilia NOT DETECTED NOT DETECTED   Candida albicans NOT DETECTED NOT DETECTED   Candida auris NOT DETECTED NOT DETECTED   Candida glabrata NOT DETECTED NOT DETECTED   Candida krusei NOT DETECTED NOT DETECTED   Candida parapsilosis NOT DETECTED NOT DETECTED   Candida tropicalis NOT DETECTED NOT DETECTED   Cryptococcus neoformans/gattii NOT DETECTED NOT DETECTED    Napoleon Form 07/10/2020  4:33 PM

## 2020-07-10 NOTE — Anesthesia Postprocedure Evaluation (Signed)
Anesthesia Post Note  Patient: [name]  Procedure(s) Performed: SHOULDER ARTHROSCOPY WITH WASHOUT (Left Shoulder)     Patient location during evaluation: PACU Anesthesia Type: General Level of consciousness: awake and alert Pain management: pain level controlled Vital Signs Assessment: post-procedure vital signs reviewed and stable Respiratory status: spontaneous breathing, nonlabored ventilation and respiratory function stable Cardiovascular status: blood pressure returned to baseline and stable Postop Assessment: no apparent nausea or vomiting Anesthetic complications: no   No complications documented.  Last Vitals:  Vitals:   07/10/20 1126 07/10/20 1130  BP: 114/88 93/75  Pulse:  99  Resp: 19 18  Temp: 36.4 C   SpO2: 98% 100%    Last Pain:  Vitals:   07/10/20 0800  TempSrc: Oral  PainSc:                  Lynda Rainwater

## 2020-07-10 NOTE — Anesthesia Preprocedure Evaluation (Signed)
Anesthesia Evaluation  Patient identified by MRN, date of birth, ID band Patient awake    Reviewed: Allergy & Precautions, H&P , NPO status , Patient's Chart, lab work & pertinent test results, reviewed documented beta blocker date and time   Airway Mallampati: II  TM Distance: >3 FB Neck ROM: Full    Dental no notable dental hx. (+) Edentulous Upper, Edentulous Lower, Dental Advisory Given   Pulmonary COPD,  COPD inhaler, former smoker,    Pulmonary exam normal breath sounds clear to auscultation       Cardiovascular hypertension, Pt. on medications and Pt. on home beta blockers + CAD, + Past MI, + CABG, + Peripheral Vascular Disease and +CHF   Rhythm:Regular Rate:Normal     Neuro/Psych Anxiety Depression negative neurological ROS     GI/Hepatic Neg liver ROS, GERD  Medicated,  Endo/Other  diabetes, Type 2, Oral Hypoglycemic Agents  Renal/GU negative Renal ROS  negative genitourinary   Musculoskeletal   Abdominal   Peds  Hematology  (+) Blood dyscrasia, anemia ,   Anesthesia Other Findings   Reproductive/Obstetrics negative OB ROS                             Anesthesia Physical  Anesthesia Plan  ASA: III and emergent  Anesthesia Plan: General   Post-op Pain Management:    Induction: Intravenous, Rapid sequence and Cricoid pressure planned  PONV Risk Score and Plan: 2 and Ondansetron, Midazolam and Treatment may vary due to age or medical condition  Airway Management Planned: Oral ETT  Additional Equipment:   Intra-op Plan:   Post-operative Plan: Extubation in OR  Informed Consent: I have reviewed the patients History and Physical, chart, labs and discussed the procedure including the risks, benefits and alternatives for the proposed anesthesia with the patient or authorized representative who has indicated his/her understanding and acceptance.     Dental advisory  given  Plan Discussed with: CRNA  Anesthesia Plan Comments:         Anesthesia Quick Evaluation

## 2020-07-10 NOTE — Op Note (Signed)
07/10/2020  11:24 AM  PATIENT:   [name]  74 y.o. male  PRE-OPERATIVE DIAGNOSIS:  Left shoulder infection with systemic sepsis  POST-OPERATIVE DIAGNOSIS: Same  PROCEDURE: Left shoulder arthroscopic lavage and extensive debridement  SURGEON:  Yeiden Frenkel, Metta Clines. M.D.  ASSISTANTS: Jenetta Loges, PA-C  ANESTHESIA:   General endotracheal  EBL: 100 cc  SPECIMEN: None  Drains: Hemovac x2   PATIENT DISPOSITION:  PACU - hemodynamically stable.    PLAN OF CARE: Admit to inpatient   Brief history:  Patient is a 74 year old male who has been followed recently for persistent left shoulder pain and swelling with radiographs showing severe bony destruction and erosion consistent with a rotator cuff tear arthropathy.  He has had recent aspirations concerning for possible gouty arthritis versus degenerative effusion versus possible occult infection.  Recent cultures were no growth final.  Yesterday evening patient presented to the ER with significantly increased left shoulder pain as well as swelling with erythema extending from the shoulder into the upper arm.  He was hemodynamically unstable with hypotension and was admitted for sepsis.  Clinical exam demonstrated the marked swelling and erythema about the shoulder with repeat x-rays showing no significant periarticular soft tissue swelling.  Previous CT scan was reviewed was again showed severe bony erosion and deformity with changes consistent with a chronic rotator cuff tear arthropathy.  He also has a known chronic acromial fracture with nonunion.  Given the patient's deteriorating hemodynamic status with underlying sepsis he is brought to the operating room this time emergently for arthroscopic lavage and debridement of the left shoulder.  Preoperatively I did counseled the patient regarding the treatment options as well as the potential risks versus benefits thereof.  Possible surgical complications were reviewed including bleeding,  persistent infection, neurovascular injury, anesthetic of patient, and possible need for additional surgery.  He understands, and accepts, and agrees with our planned procedure.  Procedure in detail:  Patient was brought directly from the intensive care unit to the operating room and placed supine on the operating table where he underwent the smooth induction of a general endotracheal anesthesia.  Patient subsequently turned to the right lateral decubitus position on a beanbag and appropriately padded and protected.  Examination under anesthesia demonstrated significantly increased skin temperature about the left shoulder.  He did have moderately strictured mobility with passive motion and evidence for an unstable acromial fracture.  Left arm was then suspended at approximately 70 degrees of abduction with 15 pounds of traction.  Left shoulder girdle region was sterilely prepped and draped in standard fashion.  Timeout was called.  A posterior portal was established into the shoulder and an anterior portal then established under direct visualization.  Upon entering the joint a large amount of purulent fluid was evacuated a shaver was introduced and initially performed an extensive debridement utilizing the shaver device removing large volumes of necrotic material.  There were large amounts of fibrinous material throughout the shoulder joint and all margins of the joint space were debrided with a shaver removing the majority of the fibrinous material.  Once the mechanical debridement was shared was completed we then irrigated the joint and were able to evacuate a large amount of additional fibrinous material which had incarcerated in various recesses around the shoulder joint.  We irrigated 9 L of fluid through the shoulder and ultimately the frequency of the fibrinous material being evacuated through our cannula decreased significantly and no additional material was being expressed.  Upon completion then we  placed 2 medium Hemovac drains 1 anterior and one posterior.  The portals were closed with simple nylon sutures.  The Hemovacs were not sutured in.  A bulky dressing then taped about the left shoulder and the patient was then awakened, extubated, and taken to the recovery room and his overall condition remained stable to his presurgical condition.  Jenetta Loges, PA-C was utilized as an Environmental consultant throughout this case, essential for help with positioning the patient, positioning extremity, tissue manipulation, implantation of the prosthesis, suture management, wound closure, and intraoperative decision-making.  Postop plan is to monitor drain output.  When drainage is less than 25 cc per shift DC drains.  Position left arm for comfort.  Encouraged gentle range of motion of the elbow wrist and hand.  Continue with IV antibiotics and adjust per definitive cultures.  Ongoing medical management per the critical care team.  Marin Shutter MD   Contact # [phone]

## 2020-07-10 NOTE — Consult Note (Signed)
Reason for Consult: Left shoulder infection with systemic sepsis Referring Physician: Critical care service  HPI: [name] is an 74 y.o. male with reported long history of left shoulder pain, recent presentation for recurring effusions.  Aspirations have been performed within the last 2 weeks which reportedly did not grow out any organisms.  Patient presented to the emergency room yesterday with increased pain and swelling with evidence for sepsis with temperatures to 102 and hypertension.  Admitted to the ICU here at [facility].  Emergent orthopedic consultation requested.  Patient does have an extensive and complex past medical history with multiple medical comorbidities.  Reviewed and as noted in medical record.  Past Medical History:  Diagnosis Date  . Anemia    NOS  . Anxiety   . 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 BPH (benign prostatic hypertrophy)   . Broken foot Oct. 12, 2013   Right foot Fx  . CAD (coronary artery disease)   . Carotid stenosis, left   . Cellulitis and abscess of leg 03/2018   right leg  . Chronic hyponatremia   . Chronic pain syndrome   . COPD (chronic obstructive pulmonary disease) ([place])   . Depression   . Diabetes mellitus   . Diabetic foot ulcer ([place])   . DJD (degenerative joint disease)    L wrist  . DM2 (diabetes mellitus, type 2) ([place])   . GERD (gastroesophageal reflux disease)   . HLD (hyperlipidemia)   . HTN (hypertension)   . Hx of blood transfusion reaction   . Hyponatremia   . Lumbar disc disease   . MI (mitral incompetence)   . Myocardial infarction ([place])    x 2  . Osteoporosis    tx per [facility], Dr Prudencio Burly, thought due to heavy steriod use after 1978  . Peripheral neuropathy   . Peripheral vascular disease ([place])   . Restless leg syndrome   . Spine fracture    hx, multiple  . Varicose veins   . 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 Vitamin D deficiency     Past Surgical  History:  Procedure Laterality Date  . ANGIOPLASTY    . aorta bifemoral bypass grafting  09/2010  . CARDIAC CATHETERIZATION N/A 09/19/2015   Procedure: Left Heart Cath and Coronary Angiography;  Surgeon: Adrian Prows, MD;  Location: [place] [facility];  Service: Cardiovascular;  Laterality: N/A;  . CARPAL TUNNEL RELEASE     right 2006/ left 2007  . CATARACT EXTRACTION     left 1996/ right 1997  . CORONARY ARTERY BYPASS GRAFT N/A 09/22/2015   Procedure: CORONARY ARTERY BYPASS GRAFTING (CABG) times four using the right greater saphenous vein harvested endoscopically and the left internal mammary artery.  LIMA-LAD, SEQ SVG-DIAG & OM, SVG-PD.;  Surgeon: [name], MD;  Location: [place];  Service: Open Heart Surgery;  Laterality: N/A;  . I & D EXTREMITY Right 04/07/2018   Procedure: IRRIGATION AND DEBRIDEMENT ABSCESS RIGHT LEG;  Surgeon: Newt Minion, MD;  Location: [place];  Service: Orthopedics;  Laterality: Right;  . INCISION AND DRAINAGE OF WOUND Right 05/07/2020   Procedure: RIGHT LONG FINGER IRRIGATION AND DEBRIDEMENT AND AMPUTATION;  Surgeon: Iran Planas, MD;  Location: [place];  Service: Orthopedics;  Laterality: Right;  with IV sedation  . INCISION AND DRAINAGE OF WOUND Right 05/12/2020   Procedure: IRRIGATION AND DEBRIDEMENT WOUND and possible revision amputation;  Surgeon:  Iran Planas, MD;  Location: [place];  Service: Orthopedics;  Laterality: Right;  needs 60 minutes  . L foot open repair jones fracture  2010    5th metetarsal   . left ankle ganglion cyst  1976  . left carotid endarterectomy  03/2011  . left cataract  1996   right - 1997  . left CTS  2006   R CTS - 2007  . left foot surgery  1998   R surgery/fracture - 1999  . left plantar ganglion cystectomy  1979  . left wrist/hand fusion  2008  . [place], 2004  . NASAL SINUS SURGERY     multiple- x8. last 1997 with obliteration  . REPAIR THORACIC AORTA  2017  . right hand fracture  1969  . ROTATOR CUFF  REPAIR  2006   R, than repeat 2011, Dr Theda Sers  . TRIGGER FINGER RELEASE Left 11/11/2014   Procedure: LEFT LONG FINGER RELEASE TRIGGER FINGER/A-1 PULLEY;  Surgeon: Milly Jakob, MD;  Location: [place];  Service: Orthopedics;  Laterality: Left;  . vocal surgery  1996    Family History  Problem Relation Age of Onset  . Lung cancer Father 105  . Diabetes Mother 60  . Osteoporosis Mother   . Depression Mother   . Bleeding Disorder Mother   . Throat cancer Other        family hx - "bad living" - also lung CA, heart disease and renal failure    . Diabetes Sister   . Suicidality Son 57  . Lung cancer Maternal Grandmother 72  . Diabetes Maternal Grandfather 31  . Heart disease Maternal Grandfather   . Heart attack Paternal Grandmother   . Brain cancer Paternal Grandfather 63    Social History:  reports that he quit smoking about 31 years ago. His smoking use included cigarettes. He has a 45.00 pack-year smoking history. He has never used smokeless tobacco. He reports previous alcohol use. He reports that he does not use drugs.  Allergies:  Allergies  Allergen Reactions  . Ivp Dye [Iodinated Diagnostic Agents]     Blood Pressure dropped----pt was pre-medicated with 13 hour prep and did fine with pre-meds--amy 03/09/11   . Penicillins Nausea Only    REACTION: stomach pain(IV ok) Has patient had a PCN reaction causing immediate rash, facial/tongue/throat swelling, SOB or lightheadedness with hypotension: No Has patient had a PCN reaction causing severe rash involving mucus membranes or skin necrosis: No Has patient had a PCN reaction that required hospitalization: No Has patient had a PCN reaction occurring within the last 10 years: No If all of the above answers are "NO", then may proceed with Cephalosporin use.   . Sulfa Antibiotics Hives  . Sulfonamide Derivatives Hives and Itching  . Lisinopril Cough  . Metoprolol Nausea And Vomiting  . Statins Other (See  Comments)    myalgias  . Propoxyphene N-Acetaminophen Other (See Comments)    Sharp pains- headache    Medications: I have reviewed the patient's current medications.  Results for orders placed or performed during the hospital encounter of 07/09/20 (from the past 48 hour(s))  Lactic acid, plasma     Status: None   Collection Time: 07/09/20  9:05 PM  Result Value Ref Range   Lactic Acid, Venous 1.6 0.5 - 1.9 mmol/L    Comment: Performed at [facility], [place] [address], [place], [place] [address]  Comprehensive metabolic panel     Status: Abnormal  Collection Time: 07/09/20  9:05 PM  Result Value Ref Range   Sodium 124 (L) 135 - 145 mmol/L   Potassium 5.1 3.5 - 5.1 mmol/L   Chloride 92 (L) 98 - 111 mmol/L   CO2 21 (L) 22 - 32 mmol/L   Glucose, Bld 219 (H) 70 - 99 mg/dL    Comment: Glucose reference range applies only to samples taken after fasting for at least 8 hours.   BUN 29 (H) 8 - 23 mg/dL   Creatinine, Ser 1.08 0.61 - 1.24 mg/dL   Calcium 9.0 8.9 - 10.3 mg/dL   Total Protein 7.8 6.5 - 8.1 g/dL   Albumin 3.2 (L) 3.5 - 5.0 g/dL   AST 19 15 - 41 U/L   ALT 16 0 - 44 U/L   Alkaline Phosphatase 125 38 - 126 U/L   Total Bilirubin 0.8 0.3 - 1.2 mg/dL   GFR, Estimated >60 >60 mL/min    Comment: (NOTE) Calculated using the CKD-EPI Creatinine Equation (2021)    Anion gap 11 5 - 15    Comment: Performed at [facility], [place] [address], [place], [place] [address]  CBC WITH DIFFERENTIAL     Status: Abnormal   Collection Time: 07/09/20  9:05 PM  Result Value Ref Range   WBC 11.6 (H) 4.0 - 10.5 K/uL   RBC 3.62 (L) 4.22 - 5.81 MIL/uL   Hemoglobin 10.5 (L) 13.0 - 17.0 g/dL   HCT 32.0 (L) 39 - 52 %   MCV 88.4 80.0 - 100.0 fL   MCH 29.0 26.0 - 34.0 pg   MCHC 32.8 30.0 - 36.0 g/dL   RDW 16.5 (H) 11.5 - 15.5 %   Platelets 360 150 - 400 K/uL   nRBC 0.0 0.0 - 0.2 %   Neutrophils Relative % 84 %   Neutro Abs 9.8 (H) 1.7 - 7.7 K/uL   Lymphocytes  Relative 3 %   Lymphs Abs 0.3 (L) 0.7 - 4.0 K/uL   Monocytes Relative 12 %   Monocytes Absolute 1.4 (H) 0.1 - 1.0 K/uL   Eosinophils Relative 0 %   Eosinophils Absolute 0.0 0.0 - 0.5 K/uL   Basophils Relative 0 %   Basophils Absolute 0.0 0.0 - 0.1 K/uL   Immature Granulocytes 1 %   Abs Immature Granulocytes 0.12 (H) 0.00 - 0.07 K/uL    Comment: Performed at [facility], [place] [address] [place], [place] [address]  Protime-INR     Status: Abnormal   Collection Time: 07/09/20  9:05 PM  Result Value Ref Range   Prothrombin Time 15.3 (H) 11.4 - 15.2 seconds   INR 1.3 (H) 0.8 - 1.2    Comment: (NOTE) INR goal varies based on device and disease states. Performed at [facility], [place] [address] [place], [place] [address]   APTT     Status: Abnormal   Collection Time: 07/09/20  9:05 PM  Result Value Ref Range   aPTT 38 (H) 24 - 36 seconds    Comment:        IF BASELINE aPTT IS ELEVATED, SUGGEST PATIENT RISK ASSESSMENT BE USED TO DETERMINE APPROPRIATE ANTICOAGULANT THERAPY. Performed at [facility], [place] [address] [place], [place] [address]   Resp Panel by RT-PCR (Flu A&B, Covid) Nasopharyngeal Swab     Status: None   Collection Time: 07/09/20  9:06 PM   Specimen: Nasopharyngeal Swab; Nasopharyngeal(NP) swabs in vial transport medium  Result Value Ref Range  SARS Coronavirus 2 by RT PCR NEGATIVE NEGATIVE    Comment: (NOTE) SARS-CoV-2 target nucleic acids are NOT DETECTED.  The SARS-CoV-2 RNA is generally detectable in upper respiratory specimens during the acute phase of infection. The lowest concentration of SARS-CoV-2 viral copies this assay can detect is 138 copies/mL. A negative result does not preclude SARS-Cov-2 infection and should not be used as the sole basis for treatment or other patient management decisions. A negative result may occur with  improper specimen collection/handling, submission of specimen  other than nasopharyngeal swab, presence of viral mutation(s) within the areas targeted by this assay, and inadequate number of viral copies(<138 copies/mL). A negative result must be combined with clinical observations, patient history, and epidemiological information. The expected result is Negative.  Fact Sheet for Patients:  EntrepreneurPulse.com.au  Fact Sheet for Healthcare Providers:  IncredibleEmployment.be  This test is no t yet approved or cleared by the [place] FDA and  has been authorized for detection and/or diagnosis of SARS-CoV-2 by FDA under an Emergency Use Authorization (EUA). This EUA will remain  in effect (meaning this test can be used) for the duration of the COVID-19 declaration under Section 564(b)(1) of the Act, 21 U.S.C.section 360bbb-3(b)(1), unless the authorization is terminated  or revoked sooner.       Influenza A by PCR NEGATIVE NEGATIVE   Influenza B by PCR NEGATIVE NEGATIVE    Comment: (NOTE) The Xpert Xpress SARS-CoV-2/FLU/RSV plus assay is intended as an aid in the diagnosis of influenza from Nasopharyngeal swab specimens and should not be used as a sole basis for treatment. Nasal washings and aspirates are unacceptable for Xpert Xpress SARS-CoV-2/FLU/RSV testing.  Fact Sheet for Patients: EntrepreneurPulse.com.au  Fact Sheet for Healthcare Providers: IncredibleEmployment.be  This test is not yet approved or cleared by the [place] FDA and has been authorized for detection and/or diagnosis of SARS-CoV-2 by FDA under an Emergency Use Authorization (EUA). This EUA will remain in effect (meaning this test can be used) for the duration of the COVID-19 declaration under Section 564(b)(1) of the Act, 21 U.S.C. section 360bbb-3(b)(1), unless the authorization is terminated or revoked.  Performed at [facility], [place] [address] [place], [place] [address]   Synovial cell count + diff, w/ crystals     Status: Abnormal   Collection Time: 07/09/20 10:00 PM  Result Value Ref Range   Color, Synovial BROWN (A) YELLOW   Appearance-Synovial TURBID (A) CLEAR   Crystals, Fluid NO CRYSTALS SEEN    WBC, Synovial 30,597 (H) 0 - 200 /cu mm   Neutrophil, Synovial 96 (H) 0 - 25 %   Lymphocytes-Synovial Fld 3 0 - 20 %   Monocyte-Macrophage-Synovial Fluid 1 (L) 50 - 90 %    Comment: Performed at [facility], [place] [address], [place], [place] [address]  Body fluid culture     Status: None (Preliminary result)   Collection Time: 07/09/20 10:00 PM   Specimen: Synovium; Body Fluid  Result Value Ref Range   Specimen Description SYNOVIAL LEFT SHOULDER    Special Requests NONE    Gram Stain      MODERATE WBC PRESENT, PREDOMINANTLY PMN NO ORGANISMS SEEN RESULT CALLED TO, READ BACK BY AND VERIFIED WITH: RN J DAVISON AT 2251 07/09/20 CRUICKSHANK A Performed at [facility], [place] [address] [place], [place] [address]    Culture PENDING    Report Status PENDING   C-reactive protein     Status: Abnormal   Collection  Time: 07/09/20 11:02 PM  Result Value Ref Range   CRP 29.0 (H) <1.0 mg/dL    Comment: Performed at [facility] [place] [address] [place], [place] [address]  Sedimentation rate     Status: Abnormal   Collection Time: 07/09/20 11:02 PM  Result Value Ref Range   Sed Rate 150 (H) 0 - 16 mm/hr    Comment: Performed at [facility], [place] [address], [place], [place] [address]  Lactic acid, plasma     Status: None   Collection Time: 07/09/20 11:26 PM  Result Value Ref Range   Lactic Acid, Venous 1.2 0.5 - 1.9 mmol/L    Comment: Performed at [facility], [place] [address], [place], [place] [address]  TSH     Status: None   Collection Time: 07/09/20 11:56 PM  Result Value Ref Range   TSH 0.996 0.350 - 4.500 uIU/mL    Comment:  Performed by a 3rd Generation assay with a functional sensitivity of <=0.01 uIU/mL. Performed at [facility], [place] [address], [place], [place] [address]   Cortisol     Status: None   Collection Time: 07/09/20 11:56 PM  Result Value Ref Range   Cortisol, Plasma 29.3 ug/dL    Comment: (NOTE) AM    6.7 - 22.6 ug/dL PM   <10.0       ug/dL Performed at [place] [address] [place], [place] [address]   POC CBG, ED     Status: Abnormal   Collection Time: 07/10/20 12:27 AM  Result Value Ref Range   Glucose-Capillary 213 (H) 70 - 99 mg/dL    Comment: Glucose reference range applies only to samples taken after fasting for at least 8 hours.  Urinalysis, Routine w reflex microscopic Urine, Clean Catch     Status: Abnormal   Collection Time: 07/10/20  1:00 AM  Result Value Ref Range   Color, Urine YELLOW YELLOW   APPearance CLEAR CLEAR   Specific Gravity, Urine 1.015 1.005 - 1.030   pH 5.0 5.0 - 8.0   Glucose, UA NEGATIVE NEGATIVE mg/dL   Hgb urine dipstick SMALL (A) NEGATIVE   Bilirubin Urine NEGATIVE NEGATIVE   Ketones, ur NEGATIVE NEGATIVE mg/dL   Protein, ur 30 (A) NEGATIVE mg/dL   Nitrite NEGATIVE NEGATIVE   Leukocytes,Ua NEGATIVE NEGATIVE   RBC / HPF 6-10 0 - 5 RBC/hpf   WBC, UA 0-5 0 - 5 WBC/hpf   Bacteria, UA NONE SEEN NONE SEEN    Comment: Performed at [facility], [place] [address] [place], [place] [address]  MRSA PCR Screening     Status: None   Collection Time: 07/10/20  1:15 AM   Specimen: Nasopharyngeal  Result Value Ref Range   MRSA by PCR NEGATIVE NEGATIVE    Comment:        The GeneXpert MRSA Assay (FDA approved for NASAL specimens only), is one component of a comprehensive MRSA colonization surveillance program. It is not intended to diagnose MRSA infection nor to guide or monitor treatment for MRSA infections. Performed at [facility], [place] [address] [place], [place] [address]    Comprehensive metabolic panel     Status: Abnormal   Collection Time: 07/10/20  2:53 AM  Result Value Ref Range   Sodium 128 (L) 135 - 145 mmol/L   Potassium 4.1 3.5 - 5.1 mmol/L    Comment: DELTA CHECK NOTED   Chloride 99 98 - 111  mmol/L   CO2 20 (L) 22 - 32 mmol/L   Glucose, Bld 107 (H) 70 - 99 mg/dL    Comment: Glucose reference range applies only to samples taken after fasting for at least 8 hours.   BUN 23 8 - 23 mg/dL   Creatinine, Ser 0.79 0.61 - 1.24 mg/dL   Calcium 8.2 (L) 8.9 - 10.3 mg/dL   Total Protein 6.2 (L) 6.5 - 8.1 g/dL   Albumin 2.6 (L) 3.5 - 5.0 g/dL   AST 15 15 - 41 U/L   ALT 13 0 - 44 U/L   Alkaline Phosphatase 106 38 - 126 U/L   Total Bilirubin 0.6 0.3 - 1.2 mg/dL   GFR, Estimated >60 >60 mL/min    Comment: (NOTE) Calculated using the CKD-EPI Creatinine Equation (2021)    Anion gap 9 5 - 15    Comment: Performed at [facility], [place] [address], [place], [place] [address]  CBC     Status: Abnormal   Collection Time: 07/10/20  2:53 AM  Result Value Ref Range   WBC 10.1 4.0 - 10.5 K/uL   RBC 3.25 (L) 4.22 - 5.81 MIL/uL   Hemoglobin 9.7 (L) 13.0 - 17.0 g/dL   HCT 29.5 (L) 39 - 52 %   MCV 90.8 80.0 - 100.0 fL   MCH 29.8 26.0 - 34.0 pg   MCHC 32.9 30.0 - 36.0 g/dL   RDW 16.8 (H) 11.5 - 15.5 %   Platelets 311 150 - 400 K/uL   nRBC 0.0 0.0 - 0.2 %    Comment: Performed at [facility], [place] [address] [place], [place] [address]  Glucose, capillary     Status: None   Collection Time: 07/10/20  4:15 AM  Result Value Ref Range   Glucose-Capillary 91 70 - 99 mg/dL    Comment: Glucose reference range applies only to samples taken after fasting for at least 8 hours.  Glucose, capillary     Status: Abnormal   Collection Time: 07/10/20  7:37 AM  Result Value Ref Range   Glucose-Capillary 111 (H) 70 - 99 mg/dL    Comment: Glucose reference range applies only to samples taken after fasting for at least 8 hours.    DG Chest  Port 1 View  Result Date: 07/09/2020 CLINICAL DATA:  Questionable sepsis EXAM: PORTABLE CHEST 1 VIEW COMPARISON:  05/02/2020, 12/31/2019 FINDINGS: Post sternotomy changes. No focal opacity or pleural effusion. Stable cardiomediastinal silhouette. No pneumothorax. Mild diffuse coarse chronic interstitial opacity. IMPRESSION: No active disease. Electronically Signed   By: Donavan Foil M.D.   On: 07/09/2020 21:30   DG Shoulder Left Port  Result Date: 07/09/2020 CLINICAL DATA:  Left arm pain swelling and erythema from shoulder EXAM: LEFT SHOULDER COMPARISON:  05/12/2020, CT 06/18/2020 FINDINGS: Marked lateral soft tissue swelling over the shoulder, probably related to complex joint effusion noted on previous imaging. Irregular AC joint arthropathy. Erosive changes of the glenoid. Free-floating acromial fragment, suspect worsened erosive changes at the scapula. IMPRESSION: Marked lateral soft tissue swelling over the shoulder, probably related to complex joint effusion noted on previous imaging. Erosive changes of the glenoid and suspected worsened erosive changes at the scapula and AC joint. There appears to be a free-floating acromial fragment. Findings could be secondary to inflammatory arthropathy versus infection. Findings appear progressive as compared with prior radiograph 05/02/2020. Electronically Signed   By: Donavan Foil M.D.   On: 07/09/2020 21:39     Vitals Temp:  [97.4  F (36.3 C)-98.7 F (37.1 C)] 97.4 F (36.3 C) (11/25 0800) Pulse Rate:  [74-106] 84 (11/25 0800) Resp:  [11-29] 23 (11/25 0800) BP: (74-106)/(51-81) 78/66 (11/25 0300) SpO2:  [95 %-100 %] 96 % (11/25 0800) Arterial Line BP: (99-125)/(48-69) 104/56 (11/25 0800) Weight:  [86.2 kg-92.5 kg] 92.5 kg (11/25 0130) Body mass index is 27.66 kg/m.  Physical Exam: Patient resting in bed, somnolent, arousable, limited historian.  Focused orthopedic exam to the left upper extremity demonstrates significant global swelling  about the shoulder with fluctuance.  Surrounding erythema.  Increased temperature to palpation.  EPL, APB, and the first dorsal interossei are intact to the left hand.  He has fair grip strength.  Demonstrates pain-free elbow motion.  Unable to elevate the left arm.  Radiographs  Recent plain films show severe bony erosion about the shoulder consistent with chronic rotator cuff tear arthropathy with evidence for chronic nonunion of an acromial fracture.  Recent CT scan is reviewed confirming the above changes.  Marked fluid distention of the joint is noted.     Assessment/Plan: Impression:  1.  Acutely infected left shoulder  2.  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  He is septic related to his left shoulder infection.  Recommended arthroscopic lavage and debridement.  Possible surgical risks are reviewed including potential for bleeding, and persistent infection, neurovascular injury, anesthetic complication, and possible need for additional surgery.  He understands, and accepts, and agrees with the plan procedure.  Plan for urgent arthroscopic lavage this morning.  Kayly Kriegel M Viridiana Spaid 07/10/2020, 9:01 AM  Contact # [phone]

## 2020-07-10 NOTE — Plan of Care (Signed)
BP managed with Levophed and fluid resuscitation.  BP monitored by arterial line.

## 2020-07-10 NOTE — Progress Notes (Signed)
Patient transported to OR. Marjie Skiff, CRNA assuming care. Patient stable on RA, levophed at 46mcg. Dentures removed and mouth care performed before transport. Glasses returned to patient's room.

## 2020-07-10 NOTE — Progress Notes (Signed)
Patient voided greater than 1L of urine throughout day, however was complaining that he did not feel as though he had emptied his bladder. Bladder scan was performed, showed >998mL. In and out cath done per protocol, 425 mL removed. Patient now feels as though bladder is emptied.

## 2020-07-10 NOTE — Progress Notes (Signed)
  Echocardiogram 2D Echocardiogram with definity has been performed.  Darlina Sicilian M 07/10/2020, 9:25 AM

## 2020-07-10 NOTE — Progress Notes (Signed)
Lower extremity venous RT study attempted. Patient refused. Adele Barthel, RN notified. Will attempt again as schedule permits.    Darlin Coco, RDMS

## 2020-07-10 NOTE — Progress Notes (Signed)
Pharmacy Antibiotic Note  [name] is a 74 y.o. male admitted on 07/09/2020 with septic arthritis.  Pharmacy has been consulted for Vancomycin and cefepime dosing.  Plan: Vancomycin 1gm IV q12h to target trough 15-20 mcg/ml Cefepime 2 gr IV q8h  Monitor renal function and cx data  Check Vancomycin trough at steady state  Height: 6' (182.9 cm) Weight: 92.5 kg (203 lb 14.8 oz) IBW/kg (Calculated) : 77.6  Temp (24hrs), Avg:97.9 F (36.6 C), Min:97.4 F (36.3 C), Max:98.7 F (37.1 C)  Recent Labs  Lab 07/09/20 2105 07/09/20 2326 07/10/20 0253  WBC 11.6*  --  10.1  CREATININE 1.08  --  0.79  LATICACIDVEN 1.6 1.2  --     Estimated Creatinine Clearance: 88.9 mL/min (by C-G formula based on SCr of 0.79 mg/dL).    Allergies  Allergen Reactions  . Ivp Dye [Iodinated Diagnostic Agents]     Blood Pressure dropped----pt was pre-medicated with 13 hour prep and did fine with pre-meds--amy 03/09/11   . Penicillins Nausea Only    REACTION: stomach pain(IV ok) Has patient had a PCN reaction causing immediate rash, facial/tongue/throat swelling, SOB or lightheadedness with hypotension: No Has patient had a PCN reaction causing severe rash involving mucus membranes or skin necrosis: No Has patient had a PCN reaction that required hospitalization: No Has patient had a PCN reaction occurring within the last 10 years: No If all of the above answers are "NO", then may proceed with Cephalosporin use.   . Sulfa Antibiotics Hives  . Sulfonamide Derivatives Hives and Itching  . Lisinopril Cough  . Metoprolol Nausea And Vomiting  . Statins Other (See Comments)    myalgias  . Propoxyphene N-Acetaminophen Other (See Comments)    Sharp pains- headache    Antimicrobials this admission: 11/24 Vancomycin >>  11/24 Rocephin >> 11/25 11/25 cefepime   Dose adjustments this admission:  Microbiology results: 11/24 BCx:  11/24 Body Fluid Cx (L shoulder synovial fluid): no organisms seem   11/24 Resp PCR: neagtive for COVID/influenza 11/25 UCx:  11/25 MRSA PCR: negative    Thank you for allowing pharmacy to be a part of this patient's care.  Royetta Asal, PharmD, BCPS 07/10/2020 12:56 PM

## 2020-07-10 NOTE — Progress Notes (Signed)
Pharmacy Antibiotic Note  [name] is a 74 y.o. male admitted on 07/09/2020 with septic arthritis.  Pharmacy has been consulted for Vancomycin dosing.  Plan: Vancomycin 1gm IV q12h to target trough 15-20 mcg/ml Monitor renal function and cx data  Check Vancomycin trough at steady state  Height: 6' (182.9 cm) Weight: 86.2 kg (190 lb) IBW/kg (Calculated) : 77.6  Temp (24hrs), Avg:98.7 F (37.1 C), Min:98.7 F (37.1 C), Max:98.7 F (37.1 C)  Recent Labs  Lab 07/09/20 2105 07/09/20 2326  WBC 11.6*  --   CREATININE 1.08  --   LATICACIDVEN 1.6 1.2    Estimated Creatinine Clearance: 65.9 mL/min (by C-G formula based on SCr of 1.08 mg/dL).    Allergies  Allergen Reactions  . Ivp Dye [Iodinated Diagnostic Agents]     Blood Pressure dropped----pt was pre-medicated with 13 hour prep and did fine with pre-meds--amy 03/09/11   . Penicillins Nausea Only    REACTION: stomach pain(IV ok) Has patient had a PCN reaction causing immediate rash, facial/tongue/throat swelling, SOB or lightheadedness with hypotension: No Has patient had a PCN reaction causing severe rash involving mucus membranes or skin necrosis: No Has patient had a PCN reaction that required hospitalization: No Has patient had a PCN reaction occurring within the last 10 years: No If all of the above answers are "NO", then may proceed with Cephalosporin use.   . Sulfa Antibiotics Hives  . Sulfonamide Derivatives Hives and Itching  . Lisinopril Cough  . Metoprolol Nausea And Vomiting  . Statins Other (See Comments)    myalgias  . Propoxyphene N-Acetaminophen Other (See Comments)    Sharp pains- headache    Antimicrobials this admission: 11/24 Vancomycin >>  11/24 Rocephin >>   Dose adjustments this admission:  Microbiology results: 11/24 BCx:  11/24 Body Fluid Cx (L shoulder synovial fluid):  11/24 Resp PCR: neagtive for COVID/influenza   Thank you for allowing pharmacy to be a part of this patient's  care.  Netta Cedars PharmD, BCPS 07/10/2020 12:18 AM

## 2020-07-10 NOTE — Progress Notes (Signed)
NAME:  [name], MRN:  [id number], DOB:  [birth year], LOS: 1 ADMISSION DATE:  07/09/2020, CONSULTATION DATE: 07/09/2020 REFERRING MD:  Dr. Regenia Skeeter, ER, CHIEF COMPLAINT:  Sepsis   Brief History   74 yo male former smoker with hx of chronic lt shoulder pain requiring arthrocentesis developed weakness, fever 102F, and hypotension (BP 74/57).  Found to have swollen/red left arm.  Had arthrocentesis in ER and started on antibiotics.  PCCM asked to admit to ICU to manage sepsis.  History of present illness   Patient lethargic and not able to provide accurate history.  History from chart and ER staff.  Past Medical History  Vit D deficiency, Varicose veins, RLS, PAD, Peripheral neuropathy, Osteoporosis, CAD with NSTEMI in 2021--  Not fully revascularized, Hyponatremia, HTN, HLD, DM type 2, Diabetic foot, Depression, COPD, Chronic pain, BPH, Blind Lt eye, Anxiety, GERD, BPH  Significant Hospital Events   11/24 Admit  Consults:  Emerg-Ortho  Procedures:    Significant Diagnostic Tests:  Lt shoulder arthrocentesis 11/24 >> brown/turbid, WBC 30,597 (96% neutrophils)  Micro Data:  COVID/Flu 11/24 >> negative Lt shoulder 11/24 >> Blood 11/24 >> Urine 11/24 >>  MRSA PCR 11/25 >> negative  Antimicrobials:  Vancomycin 11/24 >> Rocephin 11/24 >>   Interim history/subjective:  Complaining of left shoulder pain, some R shoulder pain that is chronic. He has chronic baseline LLE edema since CABG.  Objective   Blood pressure (!) 78/66, pulse 76, temperature 97.8 F (36.6 C), temperature source Oral, resp. rate 15, height 6' (1.829 m), weight 92.5 kg, SpO2 96 %.        Intake/Output Summary (Last 24 hours) at 07/10/2020 2620 Last data filed at 07/10/2020 0105 Gross per 24 hour  Intake 5.28 ml  Output 580 ml  Net -574.72 ml   Filed Weights   07/09/20 2017 07/10/20 0130  Weight: 86.2 kg 92.5 kg    Examination:  General -critically ill-appearing man laying in bed in no  acute distress Eyes -anicteric  HENT -[place]/AT Cardiac -regular rate and rhythm, no murmur Chest -breathing comfortably on room air, no conversational dyspnea.  Clear to auscultation bilaterally. Abdomen -soft, nontender, nondistended Extremities -mild erythema pitting edema right lower extremity, chronic venous stasis dermatitis changes bilateral shins.  Left shoulder red, warm, fluctuant.  No warmth, tenderness, or significant fluctuance over right shoulder. Neuro -awake, comprehension appears intact, answering questions appropriately.  Moving all extremities spontaneously  Resolved Hospital Problem list     Assessment & Plan:   Septic shock likely from septic arthritis of left shoulder -Continue broad-spectrum antibiotics; continue to follow cultures and narrow antibiotics as able. -Continue vasopressors as required to maintain MAP >65 -Appreciate orthopedics input.  Planning for washout in the OR today.  Although he has increased cardiovascular risk for surgery, I think this is unlikely to ever be fully controlled without operative intervention.  No current chest pain or EKG changes to suggest ACS or unstable angina.  Discussed with orthopedics. 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  Does not appear to be on a beta-blocker prior to admission. -Restart antiplatelet therapy as soon as possible to avoid cardiovascular ischemic complications. Echocardiogram pending  Hypovolemic hyponatremia; TSH and cortisol WNL -Continue to monitor -Cautious rehydration given cardiac history and known septic shock  Chronic right lower extremity edema -Doppler ultrasound ordered at  admission  Acute metabolic encephalopathy secondary to sepsis--improving Hx of chronic joint pain, restless  leg syndrome, diabetic neuropathy -Continue to hold outpt wellbutrin, celebrex, klonopin, duragesic patch, neurontin, percocet, requip  NAGMA, likely due to sepsis -Continue to monitor  Hx of COPD; no inhalers PTA -Albuterol every 4 hours as needed  DM type 2, suboptimally controlled with hyperglycemia -Holding OP oral hypoglycemics -Con't SSI PRN -Goal BG 140-180 while admitted to the ICU  Hx of GERD on PPI PTA -protonix IV  Hx of BPH -Resume outpatient Flomax  Anemia, chronic. Likely due to chronic disease -Serial CBCs -Transfuse for hemoglobin less than seven or hemodynamically significant bleeding  Best practice (evaluated daily)   Diet: NPO DVT prophylaxis: Lovenox GI prophylaxis: Protonix Mobility: progressive last date of multidisciplinary goals of care discussion:  Family and staff present : Summary of discussion : Follow up goals of care discussion due: Code Status: Full code Disposition: ICU  Labs   CBC: Recent Labs  Lab 07/09/20 2105 07/10/20 0253  WBC 11.6* 10.1  NEUTROABS 9.8*  --   HGB 10.5* 9.7*  HCT 32.0* 29.5*  MCV 88.4 90.8  PLT 360 836    Basic Metabolic Panel: Recent Labs  Lab 07/09/20 2105 07/10/20 0253  NA 124* 128*  K 5.1 4.1  CL 92* 99  CO2 21* 20*  GLUCOSE 219* 107*  BUN 29* 23  CREATININE 1.08 0.79  CALCIUM 9.0 8.2*   GFR: Estimated Creatinine Clearance: 88.9 mL/min (by C-G formula based on SCr of 0.79 mg/dL). Recent Labs  Lab 07/09/20 2105 07/09/20 2326 07/10/20 0253  WBC 11.6*  --  10.1  LATICACIDVEN 1.6 1.2  --     Liver Function Tests: Recent Labs  Lab 07/09/20 2105 07/10/20 0253  AST 19 15  ALT 16 13  ALKPHOS 125 106  BILITOT 0.8 0.6  PROT 7.8 6.2*  ALBUMIN 3.2* 2.6*   No results for input(s): LIPASE, AMYLASE in the last 168 hours. No results for input(s): AMMONIA in the last 168 hours.  ABG    Component Value Date/Time   PHART 7.317 (L) 09/22/2015 1908   PCO2ART 44.4 09/22/2015 1908    PO2ART 81.0 09/22/2015 1908   HCO3 22.8 09/22/2015 1908   TCO2 22 05/07/2020 1444   ACIDBASEDEF 3.0 (H) 09/22/2015 1908   O2SAT 95.0 09/22/2015 1908     Coagulation Profile: Recent Labs  Lab 07/09/20 2105  INR 1.3*    Cardiac Enzymes: No results for input(s): CKTOTAL, CKMB, CKMBINDEX, TROPONINI in the last 168 hours.  HbA1C: Hemoglobin A1C  Date/Time Value Ref Range Status  12/01/2017 12:00 AM 6.6  Final  07/28/2017 12:00 AM 6.5  Final   Hgb A1c MFr Bld  Date/Time Value Ref Range Status  05/12/2020 06:22 PM 7.2 (H) 4.8 - 5.6 % Final    Comment:    (NOTE) Pre diabetes:          5.7%-6.4%  Diabetes:              >6.4%  Glycemic control for   <7.0% adults with diabetes   06/28/2018 03:25 PM 6.5 (H) <5.7 % of total Hgb Final    Comment:    For someone without known diabetes, a hemoglobin A1c value of 6.5% or greater indicates that they may have  diabetes and this should be confirmed with a follow-up  test. . For someone with known diabetes, a value <7% indicates  that their diabetes is well controlled and a value  greater than or  equal to 7% indicates suboptimal  control. A1c targets should be individualized based on  duration of diabetes, age, comorbid conditions, and  other considerations. . Currently, no consensus exists regarding use of hemoglobin A1c for diagnosis of diabetes for children. .     CBG: Recent Labs  Lab 07/10/20 0027 07/10/20 0415  GLUCAP 213* 91     This patient is critically ill with multiple organ system failure which requires frequent high complexity decision making, assessment, support, evaluation, and titration of therapies. This was completed through the application of advanced monitoring technologies and extensive interpretation of multiple databases. During this encounter critical care time was devoted to patient care services described in this note for 45 minutes.  Julian Hy, DO 07/10/20 9:18 AM [facility]

## 2020-07-10 NOTE — Transfer of Care (Signed)
Immediate Anesthesia Transfer of Care Note  Patient: [name]  Procedure(s) Performed: SHOULDER ARTHROSCOPY WITH WASHOUT (Left Shoulder)  Patient Location: PACU  Anesthesia Type:General  Level of Consciousness: awake, alert  and patient cooperative  Airway & Oxygen Therapy: Patient Spontanous Breathing and Patient connected to face mask oxygen  Post-op Assessment: Report given to RN and Post -op Vital signs reviewed and stable  Post vital signs: Reviewed and stable  Last Vitals:  Vitals Value Taken Time  BP 93/75 07/10/20 1130  Temp    Pulse 97 07/10/20 1131  Resp 27 07/10/20 1131  SpO2 97 % 07/10/20 1131  Vitals shown include unvalidated device data.  Last Pain:  Vitals:   07/10/20 0800  TempSrc: Oral  PainSc:          Complications: No complications documented.

## 2020-07-10 NOTE — Consult Note (Signed)
[facility] for Infectious Disease    Date of Admission:  07/09/2020     Total days of antibiotics: 2  Current antibiotics: Day 2 vancomycin (11/24) Day 1 cefepime (11/25)  Previous antibiotics: Ceftriaxone 11/24 --> 11/25   Reason for Consult: Septic arthritis shoulder    Referring Physician: Dr Carlis Abbott  ASSESSMENT AND PLAN:    [name] is a 74 y.o. 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  Found to have severe sepsis related to left shoulder infection s/p arthroscopic lavage and debridement 07/10/20.  # Left shoulder infection s/p debridement # Sepsis # GPC in chains on blood cultures (BCID pending) # Diabetes  -- continue vancomycin and cefepime pending blood culture panel  -- follow up synovial fluid cultures -- wound care, glycemic control    HPI:    [name] is a 74 y.o. 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  He has been dealing with worsening left shoulder pain and effusions for couple months now.  He has undergone arthrocentesis x2 with cell counts greater than 81,000 in October and greater than 69,000 in November.  Cultures from these 2 procedures have been negative.  He underwent CT scan during this time and orthopedic notes indicate his scan showed a destructive rotator cuff arthropathy with general inflammation of the synovium.  He was treated with clindamycin it appears in October due to the turbid appearance  of his synovial fluid.  It is unclear if he was given another course of clindamycin in November.  On admission he was found to be febrile and hypotensive.  He was started on vancomycin and ceftriaxone.  He went to the OR today for debridement and washout with orthopedic surgery. He had a repeat arthrocentesis yesterday which showed greater than 30,000 white blood cells and again was neutrophil predominant.   Past Medical History:  Diagnosis Date  . Anemia    NOS  . Anxiety   . 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 BPH (benign prostatic hypertrophy)   . Broken foot Oct. 12, 2013   Right foot Fx  . CAD (coronary artery disease)   . Carotid stenosis, left   . Cellulitis and abscess of leg 03/2018   right leg  . Chronic hyponatremia   . Chronic pain syndrome   . COPD (chronic obstructive pulmonary disease) ([place])   . Depression   . Diabetes mellitus   . Diabetic foot ulcer ([place])   . DJD (degenerative joint disease)    L wrist  . DM2 (diabetes mellitus, type 2) ([place])   . GERD (gastroesophageal reflux disease)   . HLD (hyperlipidemia)   . HTN (hypertension)   . Hx of blood transfusion reaction   . Hyponatremia   . Lumbar disc disease   . MI (mitral incompetence)   . Myocardial infarction ([place])    x 2  . Osteoporosis    tx per [facility], Dr Prudencio Burly, thought due to heavy steriod use after 1978  . Peripheral  neuropathy   . Peripheral vascular disease ([place])   . Restless leg syndrome   . Spine fracture    hx, multiple  . Varicose veins   . 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 Vitamin D deficiency     Social History   Tobacco Use  . Smoking status: Former Smoker    Packs/day: 1.50    Years: 30.00    Pack years: 45.00    Types: Cigarettes    Quit date: 11/06/1988    Years since quitting: 31.6  . Smokeless tobacco: Never Used  Vaping Use  . Vaping Use: Never used  Substance Use Topics  . Alcohol use: Not Currently    Comment: None in 30  years but drank beer before- 05/09/20  . Drug use: No    Family History  Problem Relation Age of Onset  . Lung cancer Father 62  . Diabetes Mother 68  . Osteoporosis Mother   . Depression Mother   . Bleeding Disorder Mother   . Throat cancer Other        family hx - "bad living" - also lung CA, heart disease and renal failure    . Diabetes Sister   . Suicidality Son 84  . Lung cancer Maternal Grandmother 26  . Diabetes Maternal Grandfather 70  . Heart disease Maternal Grandfather   . Heart attack Paternal Grandmother   . Brain cancer Paternal Grandfather 58    Allergies  Allergen Reactions  . Ivp Dye [Iodinated Diagnostic Agents]     Blood Pressure dropped----pt was pre-medicated with 13 hour prep and did fine with pre-meds--amy 03/09/11   . Penicillins Nausea Only    REACTION: stomach pain(IV ok) Has patient had a PCN reaction causing immediate rash, facial/tongue/throat swelling, SOB or lightheadedness with hypotension: No Has patient had a PCN reaction causing severe rash involving mucus membranes or skin necrosis: No Has patient had a PCN reaction that required hospitalization: No Has patient had a PCN reaction occurring within the last 10 years: No If all of the above answers are "NO", then may proceed with Cephalosporin use.   . Sulfa Antibiotics Hives  . Sulfonamide Derivatives Hives and Itching  . Lisinopril Cough  . Metoprolol Nausea And Vomiting  . Statins Other (See Comments)    myalgias  . Propoxyphene N-Acetaminophen Other (See Comments)    Sharp pains- headache    Review of Systems  Constitutional: Positive for chills and fever.  Respiratory: Negative.   Cardiovascular: Negative.   Gastrointestinal: Negative for diarrhea, nausea and vomiting.  Musculoskeletal: Positive for joint pain.  Skin: Negative for rash.  Neurological: Negative.   Psychiatric/Behavioral: Negative.     All other systems reviewed and are negative.  OBJECTIVE:   Blood  pressure (!) 103/46, pulse 91, temperature 97.6 F (36.4 C), resp. rate 19, height 6' (1.829 m), weight 92.5 kg, SpO2 100 %. Body mass index is 27.66 kg/m.  Physical Exam Constitutional:      General: He is not in acute distress.    Appearance: Normal appearance.  Cardiovascular:     Rate and Rhythm: Normal rate and regular rhythm.     Comments: Prior CABG scar Pulmonary:     Effort: Pulmonary effort is normal. No respiratory distress.     Breath sounds: Normal breath sounds.  Musculoskeletal:        General: Tenderness and deformity present.     Comments: Left shoulder with large bandage over surgical site and drain  in place. Partial amputation of right long finger.   Skin:    General: Skin is warm and dry.     Findings: No rash.  Neurological:     General: No focal deficit present.     Mental Status: He is alert and oriented to person, place, and time.  Psychiatric:        Mood and Affect: Mood normal.        Behavior: Behavior normal.        Lab Results & Microbiology Lab Results  Component Value Date   WBC 10.1 07/10/2020   HGB 9.7 (L) 07/10/2020   HCT 29.5 (L) 07/10/2020   MCV 90.8 07/10/2020   PLT 311 07/10/2020    Lab Results  Component Value Date   NA 128 (L) 07/10/2020   K 4.1 07/10/2020   CO2 20 (L) 07/10/2020   GLUCOSE 107 (H) 07/10/2020   BUN 23 07/10/2020   CREATININE 0.79 07/10/2020   CALCIUM 8.2 (L) 07/10/2020   GFRNONAA >60 07/10/2020   GFRAA 45 (L) 05/04/2020    Lab Results  Component Value Date   ALT 13 07/10/2020   AST 15 07/10/2020   ALKPHOS 106 07/10/2020   BILITOT 0.6 07/10/2020   C-Reactive Protein     Component Value Date/Time   CRP 29.0 (H) 07/09/2020 2302   Erythrocyte Sedimentation Rate     Component Value Date/Time   ESRSEDRATE 150 (H) 07/09/2020 2302    I have reviewed the micro and lab results in Epic.  Imaging DG Chest Port 1 View  Result Date: 07/09/2020 CLINICAL DATA:  Questionable sepsis EXAM: PORTABLE  CHEST 1 VIEW COMPARISON:  05/02/2020, 12/31/2019 FINDINGS: Post sternotomy changes. No focal opacity or pleural effusion. Stable cardiomediastinal silhouette. No pneumothorax. Mild diffuse coarse chronic interstitial opacity. IMPRESSION: No active disease. Electronically Signed   By: Donavan Foil M.D.   On: 07/09/2020 21:30   DG Shoulder Left Port  Result Date: 07/09/2020 CLINICAL DATA:  Left arm pain swelling and erythema from shoulder EXAM: LEFT SHOULDER COMPARISON:  05/12/2020, CT 06/18/2020 FINDINGS: Marked lateral soft tissue swelling over the shoulder, probably related to complex joint effusion noted on previous imaging. Irregular AC joint arthropathy. Erosive changes of the glenoid. Free-floating acromial fragment, suspect worsened erosive changes at the scapula. IMPRESSION: Marked lateral soft tissue swelling over the shoulder, probably related to complex joint effusion noted on previous imaging. Erosive changes of the glenoid and suspected worsened erosive changes at the scapula and AC joint. There appears to be a free-floating acromial fragment. Findings could be secondary to inflammatory arthropathy versus infection. Findings appear progressive as compared with prior radiograph 05/02/2020. Electronically Signed   By: Donavan Foil M.D.   On: 07/09/2020 21:39   ECHOCARDIOGRAM COMPLETE  Result Date: 07/10/2020    ECHOCARDIOGRAM REPORT   Patient Name:   [name] Date of Exam: 07/10/2020 Medical Rec #:  [id number]   Height:       72.0 in Accession #:    [id number]  Weight:       203.9 lb Date of Birth:  09-Sep-1945   BSA:          2.148 m Patient Age:    41 years    BP:           78/86 mmHg Patient Gender: M           HR:           89 bpm. Exam Location:  Inpatient Procedure: 2D Echo, Intracardiac  Opacification Agent, Color Doppler and Cardiac            Doppler Indications:    Dyspnea 786.09 / R06.00  History:        Patient has prior history of Echocardiogram examinations, most                  recent 09/19/2015. CAD, Prior CABG, PAD and COPD,                 Signs/Symptoms:Fever and Hypotension; Risk Factors:Diabetes,                 Hypertension and Dyslipidemia. Septic shock likely from septic                 arthritis of left shoulder.  Sonographer:    Darlina Sicilian RDCS Referring Phys: 34 VINEET SOOD IMPRESSIONS  1. Technically difficult study with limited views  2. Left ventricular ejection fraction, by estimation, is 50 to 55%. The left ventricle has low normal function. Left ventricular diastolic parameters were normal.  3. Right ventricular systolic function is normal. The right ventricular size is mildly enlarged.  4. Left atrial size was mildly dilated.  5. The mitral valve is normal in structure. Mild mitral valve regurgitation.  6. The aortic valve is tricuspid. Aortic valve regurgitation is not visualized. Mild to moderate aortic valve sclerosis/calcification is present, without any evidence of aortic stenosis. FINDINGS  Left Ventricle: Left ventricular ejection fraction, by estimation, is 50 to 55%. The left ventricle has low normal function. The left ventricle has no regional wall motion abnormalities. Definity contrast agent was given IV to delineate the left ventricular endocardial borders. The left ventricular internal cavity size was mildly dilated. There is no left ventricular hypertrophy. Left ventricular diastolic parameters were normal. Right Ventricle: The right ventricular size is mildly enlarged. Right vetricular wall thickness was not assessed. Right ventricular systolic function is normal. Left Atrium: Left atrial size was mildly dilated. Right Atrium: Right atrial size was not well visualized. Pericardium: There is no evidence of pericardial effusion. Mitral Valve: The mitral valve is normal in structure. Mild mitral valve regurgitation. Tricuspid Valve: The tricuspid valve is normal in structure. Tricuspid valve regurgitation is trivial. Aortic Valve: The aortic valve is  tricuspid. Aortic valve regurgitation is not visualized. Mild to moderate aortic valve sclerosis/calcification is present, without any evidence of aortic stenosis. Pulmonic Valve: The pulmonic valve was not well visualized. Pulmonic valve regurgitation is not visualized. Aorta: The aortic root was not well visualized. IAS/Shunts: The interatrial septum was not well visualized.   LV Volumes (MOD) LV vol d, MOD A2C: 271.0 ml Diastology LV vol d, MOD A4C: 258.0 ml LV e' medial:    8.38 cm/s LV vol s, MOD A2C: 154.0 ml LV E/e' medial:  13.1 LV vol s, MOD A4C: 117.5 ml LV e' lateral:   18.30 cm/s LV SV MOD A2C:     117.0 ml LV E/e' lateral: 6.0 LV SV MOD A4C:     258.0 ml LV SV MOD BP:      129.3 ml RIGHT VENTRICLE TAPSE (M-mode): 2.1 cm LEFT ATRIUM             Index LA Vol (A2C):   77.5 ml 36.08 ml/m LA Vol (A4C):   85.2 ml 39.66 ml/m LA Biplane Vol: 82.6 ml 38.45 ml/m  AORTIC VALVE LVOT Vmax:   110.00 cm/s LVOT Vmean:  80.000 cm/s LVOT VTI:    0.206 m MITRAL VALVE  TRICUSPID VALVE MV Area (PHT): 5.62 cm     TR Peak grad:   22.3 mmHg MV Decel Time: 135 msec     TR Vmax:        236.00 cm/s MV E velocity: 110.00 cm/s MV A velocity: 80.00 cm/s   SHUNTS MV E/A ratio:  1.38         Systemic VTI: 0.21 m Oswaldo Milian MD Electronically signed by Oswaldo Milian MD Signature Date/Time: 07/10/2020/1:49:09 PM    Final       [facility] [phone] pager 07/10/2020, 3:50 PM

## 2020-07-10 NOTE — Anesthesia Procedure Notes (Signed)
Procedure Name: Intubation Date/Time: 07/10/2020 10:07 AM Performed by: Eben Burow, CRNA Pre-anesthesia Checklist: Patient identified, Emergency Drugs available, Suction available, Patient being monitored and Timeout performed Patient Re-evaluated:Patient Re-evaluated prior to induction Oxygen Delivery Method: Circle system utilized Preoxygenation: Pre-oxygenation with 100% oxygen Induction Type: IV induction, Rapid sequence and Cricoid Pressure applied Laryngoscope Size: Mac and 4 Grade View: Grade I Tube type: Oral Tube size: 7.5 mm Number of attempts: 1 Airway Equipment and Method: Stylet Placement Confirmation: ETT inserted through vocal cords under direct vision,  positive ETCO2 and breath sounds checked- equal and bilateral Secured at: 22 cm Tube secured with: Tape Dental Injury: Teeth and Oropharynx as per pre-operative assessment

## 2020-07-10 NOTE — Progress Notes (Signed)
[facility] Progress Note Patient Name: [name] DOB: 02-Oct-1945 MRN: [id number]   Date of Service  07/10/2020  HPI/Events of Note  RN noted unreliable Bps. Left arm with sepsis, right arm with chronic pain and patient not able to tolerate BP cuff there, right leg swelling and dopplers pending, Bps being taken in left leg and showing vast variation . Requesting art line to allow for titration of pressors  eICU Interventions  RT to place right radial art line , order placed     Intervention Category Major Interventions: Sepsis - evaluation and management  Margaretmary Lombard 07/10/2020, 2:47 AM

## 2020-07-11 ENCOUNTER — Inpatient Hospital Stay (HOSPITAL_COMMUNITY): Payer: Medicare Other

## 2020-07-11 ENCOUNTER — Encounter (HOSPITAL_COMMUNITY): Payer: Self-pay | Admitting: Orthopedic Surgery

## 2020-07-11 DIAGNOSIS — I5033 Acute on chronic diastolic (congestive) heart failure: Secondary | ICD-10-CM

## 2020-07-11 DIAGNOSIS — E119 Type 2 diabetes mellitus without complications: Secondary | ICD-10-CM

## 2020-07-11 DIAGNOSIS — M7989 Other specified soft tissue disorders: Secondary | ICD-10-CM

## 2020-07-11 DIAGNOSIS — J810 Acute pulmonary edema: Secondary | ICD-10-CM

## 2020-07-11 DIAGNOSIS — R0602 Shortness of breath: Secondary | ICD-10-CM

## 2020-07-11 DIAGNOSIS — J9601 Acute respiratory failure with hypoxia: Secondary | ICD-10-CM

## 2020-07-11 DIAGNOSIS — E782 Mixed hyperlipidemia: Secondary | ICD-10-CM

## 2020-07-11 DIAGNOSIS — I214 Non-ST elevation (NSTEMI) myocardial infarction: Secondary | ICD-10-CM

## 2020-07-11 DIAGNOSIS — Z95828 Presence of other vascular implants and grafts: Secondary | ICD-10-CM

## 2020-07-11 DIAGNOSIS — E1169 Type 2 diabetes mellitus with other specified complication: Secondary | ICD-10-CM

## 2020-07-11 DIAGNOSIS — Z9889 Other specified postprocedural states: Secondary | ICD-10-CM

## 2020-07-11 LAB — GLUCOSE, CAPILLARY
Glucose-Capillary: 124 mg/dL — ABNORMAL HIGH (ref 70–99)
Glucose-Capillary: 117 mg/dL — ABNORMAL HIGH (ref 70–99)
Glucose-Capillary: 176 mg/dL — ABNORMAL HIGH (ref 70–99)
Glucose-Capillary: 157 mg/dL — ABNORMAL HIGH (ref 70–99)
Glucose-Capillary: 121 mg/dL — ABNORMAL HIGH (ref 70–99)
Glucose-Capillary: 151 mg/dL — ABNORMAL HIGH (ref 70–99)

## 2020-07-11 LAB — CBC WITH DIFFERENTIAL/PLATELET
Abs Immature Granulocytes: 0.09 10*3/uL — ABNORMAL HIGH (ref 0.00–0.07)
Basophils Absolute: 0.1 10*3/uL (ref 0.0–0.1)
Basophils Relative: 1 %
Eosinophils Absolute: 0.1 10*3/uL (ref 0.0–0.5)
Eosinophils Relative: 1 %
HCT: 27.7 % — ABNORMAL LOW (ref 39.0–52.0)
Hemoglobin: 9.1 g/dL — ABNORMAL LOW (ref 13.0–17.0)
Immature Granulocytes: 1 %
Lymphocytes Relative: 7 %
Lymphs Abs: 0.7 10*3/uL (ref 0.7–4.0)
MCH: 29.1 pg (ref 26.0–34.0)
MCHC: 32.9 g/dL (ref 30.0–36.0)
MCV: 88.5 fL (ref 80.0–100.0)
Monocytes Absolute: 1.3 10*3/uL — ABNORMAL HIGH (ref 0.1–1.0)
Monocytes Relative: 13 %
Neutro Abs: 8.1 10*3/uL — ABNORMAL HIGH (ref 1.7–7.7)
Neutrophils Relative %: 77 %
Platelets: 355 10*3/uL (ref 150–400)
RBC: 3.13 MIL/uL — ABNORMAL LOW (ref 4.22–5.81)
RDW: 16.6 % — ABNORMAL HIGH (ref 11.5–15.5)
WBC: 10.4 10*3/uL (ref 4.0–10.5)
nRBC: 0 % (ref 0.0–0.2)

## 2020-07-11 LAB — COMPREHENSIVE METABOLIC PANEL
ALT: 13 U/L (ref 0–44)
AST: 20 U/L (ref 15–41)
Albumin: 2.6 g/dL — ABNORMAL LOW (ref 3.5–5.0)
Alkaline Phosphatase: 113 U/L (ref 38–126)
Anion gap: 10 (ref 5–15)
BUN: 13 mg/dL (ref 8–23)
CO2: 19 mmol/L — ABNORMAL LOW (ref 22–32)
Calcium: 8.1 mg/dL — ABNORMAL LOW (ref 8.9–10.3)
Chloride: 100 mmol/L (ref 98–111)
Creatinine, Ser: 0.6 mg/dL — ABNORMAL LOW (ref 0.61–1.24)
GFR, Estimated: >60 mL/min (ref >60)
Glucose, Bld: 143 mg/dL — ABNORMAL HIGH (ref 70–99)
Potassium: 4.3 mmol/L (ref 3.5–5.1)
Sodium: 129 mmol/L — ABNORMAL LOW (ref 135–145)
Total Bilirubin: 1 mg/dL (ref 0.3–1.2)
Total Protein: 6.4 g/dL — ABNORMAL LOW (ref 6.5–8.1)

## 2020-07-11 LAB — HEPARIN LEVEL (UNFRACTIONATED): Heparin Unfractionated: <0.1 IU/mL — ABNORMAL LOW (ref 0.30–0.70)

## 2020-07-11 LAB — TROPONIN I (HIGH SENSITIVITY)
Troponin I (High Sensitivity): 103 ng/L (ref <18)
Troponin I (High Sensitivity): 225 ng/L (ref <18)
Troponin I (High Sensitivity): 338 ng/L (ref <18)
Troponin I (High Sensitivity): 368 ng/L (ref <18)
Troponin I (High Sensitivity): 441 ng/L (ref <18)
Troponin I (High Sensitivity): 501 ng/L (ref <18)

## 2020-07-11 LAB — MAGNESIUM: Magnesium: 1.8 mg/dL (ref 1.7–2.4)

## 2020-07-11 LAB — BRAIN NATRIURETIC PEPTIDE: B Natriuretic Peptide: 2329.1 pg/mL — ABNORMAL HIGH (ref 0.0–100.0)

## 2020-07-11 MED ORDER — HEPARIN (PORCINE) 25000 UT/250ML-% IV SOLN
1200.0000 [IU]/h | INTRAVENOUS | Status: DC
  Administered 2020-07-11: 1200 [IU]/h via INTRAVENOUS
  Filled 2020-07-11: qty 250

## 2020-07-11 MED ORDER — MORPHINE SULFATE (PF) 2 MG/ML IV SOLN
1.0000 mg | INTRAVENOUS | Status: DC | PRN
  Administered 2020-07-11 – 2020-07-12 (×5): 2 mg via INTRAVENOUS
  Filled 2020-07-11 (×5): qty 1

## 2020-07-11 MED ORDER — MORPHINE SULFATE (PF) 2 MG/ML IV SOLN
INTRAVENOUS | Status: AC
  Administered 2020-07-11: 2 mg via INTRAVENOUS
  Filled 2020-07-11: qty 2

## 2020-07-11 MED ORDER — BUMETANIDE 0.25 MG/ML IJ SOLN
1.0000 mg | Freq: Two times a day (BID) | INTRAMUSCULAR | Status: DC
  Administered 2020-07-11: 1 mg via INTRAVENOUS
  Filled 2020-07-11: qty 10

## 2020-07-11 MED ORDER — NITROGLYCERIN 0.4 MG SL SUBL
SUBLINGUAL_TABLET | SUBLINGUAL | Status: AC
  Filled 2020-07-11: qty 1

## 2020-07-11 MED ORDER — NITROGLYCERIN IN D5W 200-5 MCG/ML-% IV SOLN
INTRAVENOUS | Status: AC
  Filled 2020-07-11: qty 250

## 2020-07-11 MED ORDER — CARVEDILOL 6.25 MG PO TABS
6.2500 mg | ORAL_TABLET | Freq: Two times a day (BID) | ORAL | Status: DC
  Administered 2020-07-11 – 2020-07-12 (×2): 6.25 mg via ORAL
  Filled 2020-07-11 (×2): qty 1

## 2020-07-11 MED ORDER — METOPROLOL TARTRATE 5 MG/5ML IV SOLN: 2.5000 mg | Freq: Four times a day (QID) | INTRAVENOUS | Status: DC

## 2020-07-11 MED ORDER — METOPROLOL TARTRATE 5 MG/5ML IV SOLN
2.5000 mg | Freq: Once | INTRAVENOUS | Status: AC
  Administered 2020-07-11: 2.5 mg via INTRAVENOUS
  Filled 2020-07-11: qty 5

## 2020-07-11 MED ORDER — METOPROLOL TARTRATE 5 MG/5ML IV SOLN
INTRAVENOUS | Status: AC
  Administered 2020-07-11: 2.5 mg
  Filled 2020-07-11: qty 5

## 2020-07-11 MED ORDER — HEPARIN BOLUS VIA INFUSION
4000.0000 [IU] | Freq: Once | INTRAVENOUS | Status: AC
  Administered 2020-07-11: 4000 [IU] via INTRAVENOUS
  Filled 2020-07-11: qty 4000

## 2020-07-11 MED ORDER — MAGNESIUM SULFATE 2 GM/50ML IV SOLN
2.0000 g | Freq: Once | INTRAVENOUS | Status: AC
  Administered 2020-07-11: 2 g via INTRAVENOUS
  Filled 2020-07-11: qty 50

## 2020-07-11 MED ORDER — NITROGLYCERIN IN D5W 200-5 MCG/ML-% IV SOLN
0.0000 ug/min | INTRAVENOUS | Status: DC
  Administered 2020-07-11: 5 ug/min via INTRAVENOUS

## 2020-07-11 MED ORDER — ONDANSETRON HCL 4 MG/2ML IJ SOLN
4.0000 mg | Freq: Once | INTRAMUSCULAR | Status: AC
  Administered 2020-07-11: 4 mg via INTRAVENOUS
  Filled 2020-07-11: qty 2

## 2020-07-11 MED ORDER — HEPARIN BOLUS VIA INFUSION
2500.0000 [IU] | Freq: Once | INTRAVENOUS | Status: AC
  Administered 2020-07-11: 2500 [IU] via INTRAVENOUS
  Filled 2020-07-11: qty 2500

## 2020-07-11 MED ORDER — MORPHINE SULFATE (PF) 2 MG/ML IV SOLN: 2.0000 mg | Freq: Once | INTRAVENOUS | Status: DC

## 2020-07-11 MED ORDER — FUROSEMIDE 10 MG/ML IJ SOLN
60.0000 mg | Freq: Once | INTRAMUSCULAR | Status: AC
  Administered 2020-07-11: 60 mg via INTRAVENOUS
  Filled 2020-07-11: qty 6

## 2020-07-11 MED ORDER — PHENYLEPHRINE HCL-NACL 10-0.9 MG/250ML-% IV SOLN: 0.0000 ug/min | INTRAVENOUS | Status: DC

## 2020-07-11 MED ORDER — MORPHINE SULFATE (PF) 2 MG/ML IV SOLN
INTRAVENOUS | Status: AC
  Administered 2020-07-11: 2 mg via INTRAVENOUS
  Filled 2020-07-11: qty 1

## 2020-07-11 MED ORDER — HEPARIN (PORCINE) 25000 UT/250ML-% IV SOLN
1550.0000 [IU]/h | INTRAVENOUS | Status: DC
  Administered 2020-07-11: 1550 [IU]/h via INTRAVENOUS
  Filled 2020-07-11: qty 250

## 2020-07-11 MED ORDER — METOPROLOL TARTRATE 5 MG/5ML IV SOLN: 2.5000 mg | INTRAVENOUS | Status: DC | PRN

## 2020-07-11 MED ORDER — SODIUM CHLORIDE 0.9 % IV SOLN
2.0000 g | INTRAVENOUS | Status: DC
  Administered 2020-07-11: 2 g via INTRAVENOUS
  Filled 2020-07-11: qty 20

## 2020-07-11 MED ORDER — ISOSORBIDE MONONITRATE ER 60 MG PO TB24
60.0000 mg | ORAL_TABLET | Freq: Every day | ORAL | Status: DC
  Administered 2020-07-11: 60 mg via ORAL
  Filled 2020-07-11: qty 1

## 2020-07-11 NOTE — Progress Notes (Addendum)
NAME:  [name], MRN:  [id number], DOB:  [birth year], LOS: 2 ADMISSION DATE:  07/09/2020, CONSULTATION DATE: 07/09/2020 REFERRING MD:  Dr. Regenia Skeeter, ER, CHIEF COMPLAINT:  Sepsis   Brief History   74 yo male former smoker with hx of chronic lt shoulder pain requiring arthrocentesis developed weakness, fever 102F, and hypotension (BP 74/57).  Found to have swollen/red left arm.  Had arthrocentesis in ER and started on antibiotics.  PCCM asked to admit to ICU to manage sepsis.  History of present illness   Patient lethargic and not able to provide accurate history.  History from chart and ER staff.  Past Medical History  Vit D deficiency, Varicose veins, RLS, PAD, Peripheral neuropathy, Osteoporosis, CAD with NSTEMI in 2021--  Not fully revascularized, Hyponatremia, HTN, HLD, DM type 2, Diabetic foot, Depression, COPD, Chronic pain, BPH, Blind Lt eye, Anxiety, GERD, BPH  Significant Hospital Events   11/24 Admit  Consults:  [facility]- [place]  Procedures:  I&D left shoulder 11/25  Significant Diagnostic Tests:  Lt shoulder arthrocentesis 11/24 >> brown/turbid, WBC 30,597 (96% neutrophils) Transthoracic echo 11/25> LVEF 40-34%, normal diastolic function. Left atrial dilation. Mildly enlarged RV. Mild MR. Ultrasound right lower extremity 11/26>  Micro Data:  COVID/Flu 11/24 >> negative Lt shoulder synovial fluid 11/24 >> Blood 11/24 >> strep agalactiae Urine 11/24 >>  MRSA PCR 11/25 >> negative  Antimicrobials:  Vancomycin 11/24 >> Rocephin 11/24  Cefepime 11/24>  Interim history/subjective:  He feels better than he did overnight, less chest pain, although chest pain returned later in the morning.  Objective   Blood pressure (!) 103/46, pulse (!) 109, temperature 99.4 F (37.4 C), temperature source Oral, resp. rate (!) 27, height 6' (1.829 m), weight 92.5 kg, SpO2 98 %.        Intake/Output Summary (Last 24 hours) at 07/11/2020 0740 Last data  filed at 07/10/2020 1930 Gross per 24 hour  Intake 2456.85 ml  Output 2295 ml  Net 161.85 ml   Filed Weights   07/09/20 2017 07/10/20 0130  Weight: 86.2 kg 92.5 kg    Examination:  General: ill appearing man laying in bed Eyes: anicteric HENT: [place]/AT Cardiac: tachycardic, regular rhythm, occasional PVCs on telemetry Resp: tachypnea but no accessory muscle use, laying flat in bed. No rhales or wheezing. Abdomen: soft, ND Extremities: left shoulder bandaged, drain in place with bloody output Neuro: awake, answering questions appropriately, appears mildly confused. Moving all extremities spontaneously  Tele: sinus tachycardia EKG: lateral precordial ST depressions, Qtc 411 CXR personally reviewed> bilateral pulmonary edema  Resolved Hospital Problem list     Assessment & Plan:   Septic shock due to septic arthritis of left shoulder and strep bacteremia -Con't broad spectrum antibiotics. Appreciate ID's assistance. -Appreciate ortho's assistance for source control. -Continue vasopressors as required to maintain MAP >65. Titrate down as able.  NSTEMI, likely demand related to sepsis, recent surgery, hypervolemia. 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 Unable to use amiodarone due to history of hypotension with iodinated contrast in the past. -Start Heparin drip. Con't aspirin, restarting Brilinta today. -con't NTG infusion -Morphine 2 mg IV as needed for chest pain -Holding heart failure meds for now given shock; will restart Imdur and Entresto when able.   -Cardiology consult-[facility] -Serial troponins, daily BNP  Acute hypoxic respiratory failure due to acute pulmonary  edema -Supplemental oxygen as required to maintain SPO2 greater than 90% -Diuresis,  strict I's/O  Hypervolemic hyponatremia; TSH and cortisol WNL-volume resuscitated at admission -Continue to monitor  Chronic right lower extremity edema -Doppler ultrasound completed, awaiting results  Acute metabolic encephalopathy secondary to sepsis--improving Hx of chronic joint pain, restless leg syndrome, diabetic neuropathy -Continue to hold outpt wellbutrin, celebrex, klonopin, duragesic patch, neurontin, percocet  Restless leg syndrome -SCDs -Con't home Requip  NAGMA, likely due to sepsis-stable -Continue to monitor  Hx of COPD; no inhalers PTA -Albuterol every 4 hours as needed -BiPAP as needed  DM type 2, suboptimally controlled with hyperglycemia -Holding OP oral hypoglycemics -Con't SSI PRN -Goal BG 140-180 while admitted to the ICU  Hx of GERD on PPI PTA -protonix IV  Hx of BPH -Con't outpatient Flomax  Anemia, chronic. Likely due to chronic disease -Serial CBCs -Transfuse for hemoglobin <8 given active ischemia or hemodynamically significant bleeding  Will update family later today.  Best practice (evaluated daily)   Diet: clears DVT prophylaxis: Lovenox GI prophylaxis: Protonix Mobility: progressive last date of multidisciplinary goals of care discussion:  Family and staff present : Summary of discussion : Follow up goals of care discussion due: Code Status: Full code Disposition: ICU  Labs   CBC: Recent Labs  Lab 07/09/20 2105 07/10/20 0253 07/11/20 0425  WBC 11.6* 10.1 10.4  NEUTROABS 9.8*  --  8.1*  HGB 10.5* 9.7* 9.1*  HCT 32.0* 29.5* 27.7*  MCV 88.4 90.8 88.5  PLT 360 311 742    Basic Metabolic Panel: Recent Labs  Lab 07/09/20 2105 07/10/20 0253 07/11/20 0425  NA 124* 128* 129*  K 5.1 4.1 4.3  CL 92* 99 100  CO2 21* 20* 19*  GLUCOSE 219* 107* 143*  BUN 29* 23 13  CREATININE 1.08 0.79 0.60*  CALCIUM 9.0 8.2* 8.1*  MG  --   --  1.8   GFR: Estimated Creatinine Clearance: 88.9 mL/min (A) (by C-G formula based on SCr  of 0.6 mg/dL (L)). Recent Labs  Lab 07/09/20 2105 07/09/20 2326 07/10/20 0253 07/11/20 0425  WBC 11.6*  --  10.1 10.4  LATICACIDVEN 1.6 1.2  --   --     Liver Function Tests: Recent Labs  Lab 07/09/20 2105 07/10/20 0253 07/11/20 0425  AST 19 15 20   ALT 16 13 13   ALKPHOS 125 106 113  BILITOT 0.8 0.6 1.0  PROT 7.8 6.2* 6.4*  ALBUMIN 3.2* 2.6* 2.6*   No results for input(s): LIPASE, AMYLASE in the last 168 hours. No results for input(s): AMMONIA in the last 168 hours.  ABG    Component Value Date/Time   PHART 7.317 (L) 09/22/2015 1908   PCO2ART 44.4 09/22/2015 1908   PO2ART 81.0 09/22/2015 1908   HCO3 22.8 09/22/2015 1908   TCO2 22 05/07/2020 1444   ACIDBASEDEF 3.0 (H) 09/22/2015 1908   O2SAT 95.0 09/22/2015 1908     Coagulation Profile: Recent Labs  Lab 07/09/20 2105  INR 1.3*    Cardiac Enzymes: No results for input(s): CKTOTAL, CKMB, CKMBINDEX, TROPONINI in the last 168 hours.  HbA1C: Hemoglobin A1C  Date/Time Value Ref Range Status  12/01/2017 12:00 AM 6.6  Final  07/28/2017 12:00 AM 6.5  Final   Hgb A1c MFr Bld  Date/Time Value Ref Range Status  05/12/2020 06:22 PM 7.2 (H) 4.8 - 5.6 % Final    Comment:    (NOTE) Pre diabetes:          5.7%-6.4%  Diabetes:              >  6.4%  Glycemic control for   <7.0% adults with diabetes   06/28/2018 03:25 PM 6.5 (H) <5.7 % of total Hgb Final    Comment:    For someone without known diabetes, a hemoglobin A1c value of 6.5% or greater indicates that they may have  diabetes and this should be confirmed with a follow-up  test. . For someone with known diabetes, a value <7% indicates  that their diabetes is well controlled and a value  greater than or equal to 7% indicates suboptimal  control. A1c targets should be individualized based on  duration of diabetes, age, comorbid conditions, and  other considerations. . Currently, no consensus exists regarding use of hemoglobin A1c for diagnosis of  diabetes for children. .     CBG: Recent Labs  Lab 07/10/20 1637 07/10/20 1957 07/10/20 2355 07/11/20 0342 07/11/20 0732  GLUCAP 150* 173* 163* 124* 117*     This patient is critically ill with multiple organ system failure which requires frequent high complexity decision making, assessment, support, evaluation, and titration of therapies. This was completed through the application of advanced monitoring technologies and extensive interpretation of multiple databases. During this encounter critical care time was devoted to patient care services described in this note for 50 minutes.  Julian Hy, DO 07/11/20 7:40 AM [facility]

## 2020-07-11 NOTE — Progress Notes (Signed)
eLink Physician-Brief Progress Note Patient Name: [name] DOB: 1946/05/04 MRN: [id number]   Date of Service  07/11/2020  HPI/Events of Note  Trigeminy in the context of myocardial ischemia.  eICU Interventions  Metoprolol 2.5 mg iv Q 6 hours  (hold for MAP < 70 mmHg)        Kalsey Lull U Johneisha Broaden 07/11/2020, 6:27 AM

## 2020-07-11 NOTE — Care Plan (Signed)
I talked to Mr. Fundora about his ongoing care. He has no family that he is close with since losing one of his sons many years ago. He has a son in [place], [place] that he would like to designate as his surrogate decision maker and has given permission for [place] to contact him. I attemtped to call him at both numbers listed in the chart unsuccessfully; L/M on home VM asking him to please call back.  Julian Hy, DO 07/11/20 3:25 PM [facility]

## 2020-07-11 NOTE — Progress Notes (Signed)
ANTICOAGULATION CONSULT NOTE -  Consult  Pharmacy Consult for IV heparin Indication: chest pain/ACS  Allergies  Allergen Reactions  . Ivp Dye [Iodinated Diagnostic Agents]     Blood Pressure dropped----pt was pre-medicated with 13 hour prep and did fine with pre-meds--amy 03/09/11   . Penicillins Nausea Only    REACTION: stomach pain(IV ok) Has patient had a PCN reaction causing immediate rash, facial/tongue/throat swelling, SOB or lightheadedness with hypotension: No Has patient had a PCN reaction causing severe rash involving mucus membranes or skin necrosis: No Has patient had a PCN reaction that required hospitalization: No Has patient had a PCN reaction occurring within the last 10 years: No If all of the above answers are "NO", then may proceed with Cephalosporin use.   . Sulfa Antibiotics Hives  . Sulfonamide Derivatives Hives and Itching  . Lisinopril Cough  . Metoprolol Nausea And Vomiting  . Statins Other (See Comments)    myalgias  . Propoxyphene N-Acetaminophen Other (See Comments)    Sharp pains- headache    Patient Measurements: Height: 6' (182.9 cm) Weight: 92.5 kg (203 lb 14.8 oz) IBW/kg (Calculated) : 77.6 Heparin Dosing Weight:   Vital Signs: Temp: 99.4 F (37.4 C) (11/26 1600) Temp Source: Axillary (11/26 1600) BP: 141/90 (11/26 1830) Pulse Rate: 86 (11/26 1830)  Labs: Recent Labs    07/09/20 2105 07/09/20 2105 07/10/20 0253 07/11/20 0425 07/11/20 0640 07/11/20 1110 07/11/20 1300 07/11/20 1636  HGB 10.5*   < > 9.7* 9.1*  --   --   --   --   HCT 32.0*  --  29.5* 27.7*  --   --   --   --   PLT 360  --  311 355  --   --   --   --   APTT 38*  --   --   --   --   --   --   --   LABPROT 15.3*  --   --   --   --   --   --   --   INR 1.3*  --   --   --   --   --   --   --   HEPARINUNFRC  --   --   --   --   --   --   --  <0.10*  CREATININE 1.08  --  0.79 0.60*  --   --   --   --   TROPONINIHS  --   --   --  103*   < > 338* 368* 441*   < > =  values in this interval not displayed.    Estimated Creatinine Clearance: 88.9 mL/min (A) (by C-G formula based on SCr of 0.6 mg/dL (L)).    Assessment: Pharmacy is consulted to dose heparin in 74 yo male diagnosed with ACS. Pt complaining of CP with SOB and diaphoresis over night. 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 Currently on aspirin 81 mg PO daily and ticagrelor 90 mg PO BID. Not on any blood thinners PTA.    Today, 07/11/20  Hgb 9.1 low but stable  Plt 355   Scr < 1   First heparin level undetectable after 4000 unit bolus and heparin drip at 1200 units/hr.  RN confirms heparin drip is infusing well with no issues  No bleeding per RN   Goal of Therapy:  Heparin level 0.3-0.7 units/ml Monitor platelets  by anticoagulation protocol: Yes   Plan:   Re bolus with heparin 2500 units  Increase heparin drip to 1550 unit/hr   Obtain HL 8 hours after bolus and rate increase   Daily CBC while on heparin   Monitor for signs and symptoms of bleeding, esp as pt is on DAPT   [name], Pharm.D 07/11/2020 6:50 PM

## 2020-07-11 NOTE — Consult Note (Signed)
CARDIOLOGY CONSULT NOTE  Patient ID: [name] MRN: [id number] DOB/AGE: 12-27-1945 74 y.o.  Admit date: 07/09/2020 Attending physician: Julian Hy, DO Primary Physician:  Patrecia Pour, Christean Grief, MD  Referring Physician:  Julian Hy, DO Outpatient Cardiologist: Dr. Laurance Flatten at [facility].  Former Cardiologist: Dr. Einar Gip (last seen 2012) Inpatient Cardiologist: Rex Kras, DO, [facility]  Chief complaint: arm pain and low blood pressure Reason of Consult: NSTEMI   HPI:  [name] is a 74 y.o. Caucasian male who presents with a chief complaint of " left arm pain and low blood pressure." His past medical history and cardiovascular risk factors include: Established CAD with prior CABG in 2017, PAD status post aortobifemoral bypass by Dr. 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  Patient stated that he has been having left shoulder pain and has been getting fluid drained sporadically by orthopedics for the last couple months.  His last arthrocentesis was approximately 1 to 2 weeks ago prior to admission.  When he presented to the hospital he complained of subjective fevers for which she has been taking Tylenol.  Since then patient has been admitted to ICU and has been treated for septic arthritis and sepsis.  After the arthrocentesis in the ER patient was started on IV antibiotics, fluids, and vasopressor support.  Yesterday on 07/10/2020 patient underwent left shoulder arthroscopic lavage and extensive debridement with orthopedic surgery.  He tolerated the procedure well and was transferred back to ICU for further management.  Overnight patient started experiencing chest discomfort and troponins were checked which were elevated; therefore, today cardiology was consulted for management of  non-STEMI.  Patient states that he follows up with Dr. Einar Gip but his last encounter was approximately back in 2012.  He currently follows Dr. Laurance Flatten at [facility].  Patient has a complex cardiac/vascular history and multiple cardiovascular risk factors as outlined above.  Brilinta was held yesterday as he underwent left shoulder arthroscopic lavage and extensive debridement.  His high sensitive troponins were checked this morning 103-->225-->338, not yet peaked. BNP 2329. In March 2021 his high sensitive troponin was 1131.    Echocardiogram performed yesterday on 07/10/2020 notes low normal LVEF at 61-95%, diastolic parameters reported to be normal, RV size mildly enlarged with normal systolic function, mild left atrial enlargement, mild MR.  Patient states that his pain is located substernally, intensity 5 out of 10, pressure-like sensation, constant since morning, unable to comment on if the pain is effort related.  He is resting in bed comfortably in some discomfort due to pain and shortness of breath.  Based on Dr. Tawanna Sat last office note that is available on Care Everywhere dated 04/29/2020.  The following is an excerpt from that office encounter.  "Coronary artery disease. He has known occlusion of RCA. His graft to the circumflex is down and he has tight stenosis in the proximal circumflex. He has good flow through the LIMA to LAD and reasonable collateralization of the RCA from the LAD. The circumflex territory remains un-revascularized. He had several attempts at revascularizing this territory percutaneously, and they all failed. Given reasonable flow to the LAD and RCA territories, I do not think there is benefit in considering repeat CABG. He is on dual antiplatelet therapy now due to recent non-ST elevation MI and his lipids are well-controlled on Praluent and pravastatin."  ALLERGIES: Allergies  Allergen Reactions  . Ivp Dye [Iodinated Diagnostic Agents]     Blood  Pressure dropped----pt was pre-medicated with 13 hour prep and did fine with pre-meds--amy 03/09/11   . Penicillins Nausea Only    REACTION: stomach pain(IV ok) Has patient had a PCN reaction causing immediate rash, facial/tongue/throat swelling, SOB or lightheadedness with hypotension: No Has patient had a PCN reaction causing severe rash involving mucus membranes or skin necrosis: No Has patient had a PCN reaction that required hospitalization: No Has patient had a PCN reaction occurring within the last 10 years: No If all of the above answers are "NO", then may proceed with Cephalosporin use.   . Sulfa Antibiotics Hives  . Sulfonamide Derivatives Hives and Itching  . Lisinopril Cough  . Metoprolol Nausea And Vomiting  . Statins Other (See Comments)    myalgias  . Propoxyphene N-Acetaminophen Other (See Comments)    Sharp pains- headache    PAST MEDICAL HISTORY: Past Medical History:  Diagnosis Date  . Anemia    NOS  . Anxiety   . 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 BPH (benign prostatic hypertrophy)   . Broken foot Oct. 12, 2013   Right foot Fx  . CAD (coronary artery disease)   . Carotid stenosis, left   . Cellulitis and abscess of leg 03/2018   right leg  . Chronic hyponatremia   . Chronic pain syndrome   . COPD (chronic obstructive pulmonary disease) ([place])   . Depression   . Diabetes mellitus   . Diabetic foot ulcer ([place])   . DJD (degenerative joint disease)    L wrist  . DM2 (diabetes mellitus, type 2) ([place])   . GERD (gastroesophageal reflux disease)   . HLD (hyperlipidemia)   . HTN (hypertension)   . Hx of blood transfusion reaction   . Hyponatremia   . Lumbar disc disease   . MI (mitral incompetence)   . Myocardial infarction ([place])    x 2  . Osteoporosis    tx per [facility], Dr Prudencio Burly, thought due to heavy steriod use after 1978  . Peripheral neuropathy   . Peripheral vascular disease ([place])   . Restless leg syndrome   . Spine fracture     hx, multiple  . Varicose veins   . 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 Vitamin D deficiency     PAST SURGICAL HISTORY: Past Surgical History:  Procedure Laterality Date  . ANGIOPLASTY    . aorta bifemoral bypass grafting  09/2010  . CARDIAC CATHETERIZATION N/A 09/19/2015   Procedure: Left Heart Cath and Coronary Angiography;  Surgeon: Adrian Prows, MD;  Location: [place] [facility];  Service: Cardiovascular;  Laterality: N/A;  . CARPAL TUNNEL RELEASE     right 2006/ left 2007  . CATARACT EXTRACTION     left 1996/ right 1997  . CORONARY ARTERY BYPASS GRAFT N/A 09/22/2015   Procedure: CORONARY ARTERY BYPASS GRAFTING (CABG) times four using the right greater saphenous vein harvested endoscopically and the left internal mammary artery.  LIMA-LAD, SEQ SVG-DIAG & OM, SVG-PD.;  Surgeon: [name], MD;  Location: [place];  Service: Open Heart Surgery;  Laterality: N/A;  . I & D EXTREMITY Right 04/07/2018   Procedure: IRRIGATION AND DEBRIDEMENT ABSCESS RIGHT LEG;  Surgeon: Newt Minion, MD;  Location: [place];  Service: Orthopedics;  Laterality: Right;  . INCISION AND DRAINAGE OF WOUND Right  05/07/2020   Procedure: RIGHT LONG FINGER IRRIGATION AND DEBRIDEMENT AND AMPUTATION;  Surgeon: Iran Planas, MD;  Location: [place];  Service: Orthopedics;  Laterality: Right;  with IV sedation  . INCISION AND DRAINAGE OF WOUND Right 05/12/2020   Procedure: IRRIGATION AND DEBRIDEMENT WOUND and possible revision amputation;  Surgeon: Iran Planas, MD;  Location: [place];  Service: Orthopedics;  Laterality: Right;  needs 60 minutes  . L foot open repair jones fracture  2010    5th metetarsal   . left ankle ganglion cyst  1976  . left carotid endarterectomy  03/2011  . left cataract  1996   right - 1997  . left CTS  2006   R CTS - 2007  . left foot surgery  1998   R surgery/fracture - 1999  . left plantar ganglion cystectomy  1979  . left wrist/hand fusion  2008  . [place], 2004  . NASAL SINUS SURGERY     multiple- x8. last 1997 with obliteration  . REPAIR THORACIC AORTA  2017  . right hand fracture  1969  . ROTATOR CUFF REPAIR  2006   R, than repeat 2011, Dr Theda Sers  . SHOULDER ARTHROSCOPY WITH LABRAL REPAIR Left 07/10/2020   Procedure: SHOULDER ARTHROSCOPY WITH WASHOUT;  Surgeon: Justice Britain, MD;  Location: [facility];  Service: Orthopedics;  Laterality: Left;  . TRIGGER FINGER RELEASE Left 11/11/2014   Procedure: LEFT LONG FINGER RELEASE TRIGGER FINGER/A-1 PULLEY;  Surgeon: Milly Jakob, MD;  Location: [place];  Service: Orthopedics;  Laterality: Left;  . vocal surgery  1996    FAMILY HISTORY: The patient family history includes Bleeding Disorder in his mother; Brain cancer (age of onset: 32) in his paternal grandfather; Depression in his mother; Diabetes in his sister; Diabetes (age of onset: 21) in his maternal grandfather; Diabetes (age of onset: 76) in his mother; Heart attack in his paternal grandmother; Heart disease in his maternal grandfather; Lung cancer (age of onset: 67) in his maternal grandmother; Lung cancer (age of onset: 44) in his father; Osteoporosis in his mother; Suicidality (age of onset: 21) in his son; Throat cancer in an other family member.   SOCIAL HISTORY:  The patient  reports that he quit smoking about 31 years ago. His smoking use included cigarettes. He has a 45.00 pack-year smoking history. He has never used smokeless tobacco. He reports previous alcohol use. He reports that he does not use drugs.  MEDICATIONS: Current Outpatient Medications  Medication Instructions  . ACCU-CHEK FASTCLIX LANCETS MISC Use as directed to check blood glucose. Dx: E11.51   . acetaminophen (TYLENOL) 1,000 mg, Oral, Every 6 hours PRN  . albuterol (PROAIR HFA) 108 (90 Base) MCG/ACT inhaler 2 puffs, Inhalation, Every 6 hours PRN  . aspirin 325 mg, Oral, Daily  . Blood Glucose Monitoring Suppl (ACCU-CHEK AVIVA PLUS) w/Device  KIT Use as directed to check blood glucose daily. Dx: E11.51   . Brilinta 90 mg, Oral, 2 times daily  . buPROPion (WELLBUTRIN XL) 300 mg, Oral, Daily  . celecoxib (CELEBREX) 200 MG capsule TAKE 1 CAPSULE(200 MG) BY MOUTH DAILY  . clonazePAM (KLONOPIN) 0.5 mg, Oral, 2 times daily PRN  . ENTRESTO 49-51 MG 1 tablet, Oral, 2 times daily  . furosemide (LASIX) 20 MG tablet TAKE 1 TABLET BY MOUTH TWICE DAILY  . gabapentin (NEURONTIN) 400 MG capsule 1 po q am, 1 po at lunch, 2 po qhs.  Marland Kitchen glucose blood (ACCU-CHEK AVIVA PLUS) test  strip 1 each, Other, As needed, Use as instructed. Dx: E11.51   . IRON PO 1 capsule, Oral, Daily  . isosorbide mononitrate (IMDUR) 60 mg, Oral, Daily  . JANUVIA 100 MG tablet TAKE 1 TABLET BY MOUTH EVERY DAY  . Multiple Vitamin (MULTIVITAMIN WITH MINERALS) TABS tablet 1 tablet, Oral, Daily  . Narcan 4 mg, Nasal, As needed  . nitroGLYCERIN (NITROSTAT) 0.4 MG SL tablet PLACE 1 TABLET UNDER THE TONGUE EVERY 5 MINUTES AS NEEDED FOR CHEST PAIN  . omeprazole (PRILOSEC) 40 mg, Oral, 2 times daily  . oxyCODONE-acetaminophen (PERCOCET) 10-325 MG tablet 1 tablet, Oral, 2 times daily  . rOPINIRole (REQUIP) 1 mg, Oral, 2 times daily  . tamsulosin (FLOMAX) 0.4 mg, Oral, 2 times daily  . Vitamin D3 5,000 Units, Oral, Daily    Review of Systems  Constitutional: Negative for chills and fever.  HENT: Negative for hoarse voice and nosebleeds.   Eyes: Negative for discharge, double vision and pain.  Cardiovascular: Positive for chest pain. Negative for claudication, dyspnea on exertion, leg swelling, near-syncope, orthopnea, palpitations, paroxysmal nocturnal dyspnea and syncope.  Respiratory: Positive for shortness of breath. Negative for hemoptysis.   Musculoskeletal: Positive for arthritis and joint pain. Negative for muscle cramps and myalgias.  Gastrointestinal: Negative for abdominal pain, constipation, diarrhea, hematemesis, hematochezia, melena, nausea and vomiting.  Neurological:  Negative for dizziness and light-headedness.  All other systems reviewed and are negative.   PHYSICAL EXAM: Vitals with BMI 07/11/2020 07/11/2020 07/11/2020  Height - - -  Weight - - -  BMI - - -  Systolic 017 - -  Diastolic 41 - -  Pulse 494 107 125     Intake/Output Summary (Last 24 hours) at 07/11/2020 1228 Last data filed at 07/11/2020 1125 Gross per 24 hour  Intake 2683.89 ml  Output 2225 ml  Net 458.89 ml    Net IO Since Admission: 635.74 mL [07/11/20 1228]  CONSTITUTIONAL: Age-appropriate male, hemodynamically stable, mild distress. SKIN: Skin is warm and dry. No rash noted. No cyanosis. No pallor. No jaundice HEAD: Normocephalic and atraumatic.  EYES: No scleral icterus MOUTH/THROAT: Moist oral membranes.  NECK: No JVD present. No thyromegaly noted. No carotid bruits.  Surgical scar well-healed. LYMPHATIC: No visible cervical adenopathy.  CHEST Normal respiratory effort. No intercostal retractions  LUNGS: Clear to auscultation laterally.  Patient unable to sit upright due to shoulder pains bilaterally to auscultate posteriorly. No stridor. No wheezes. No rales.  CARDIOVASCULAR: Regular, tachycardic, positive W9-Q7, soft holosystolic murmur heard at the apex, no gallops or rubs. ABDOMINAL: Soft, nontender, nondistended, positive bowel sounds in all 4 quadrants, no apparent ascites.  EXTREMITIES: No peripheral edema.  2+ bilateral femoral pulses.  Diminished popliteal and posterior tibial pulses bilaterally.  Warm to touch bilaterally. HEMATOLOGIC: No significant bruising NEUROLOGIC: Oriented to person, place, and time. Nonfocal. Normal muscle tone.  PSYCHIATRIC: Normal mood and affect. Normal behavior. Cooperative  RADIOLOGY: DG Chest Port 1 View  Result Date: 07/11/2020 CLINICAL DATA:  Chest pain and increasing shortness of breath EXAM: PORTABLE CHEST 1 VIEW COMPARISON:  07/09/2020 FINDINGS: Postoperative changes in the mediastinum. Normal heart size. Developing  linear atelectasis in the right mid lung and lung bases. No focal consolidation. No pleural effusions. No pneumothorax. Calcification of the aorta. IMPRESSION: Developing linear atelectasis in the right mid lung and lung bases. Electronically Signed   By: Lucienne Capers M.D.   On: 07/11/2020 04:49   DG Chest Port 1 View  Result Date: 07/09/2020 CLINICAL DATA:  Questionable  sepsis EXAM: PORTABLE CHEST 1 VIEW COMPARISON:  05/02/2020, 12/31/2019 FINDINGS: Post sternotomy changes. No focal opacity or pleural effusion. Stable cardiomediastinal silhouette. No pneumothorax. Mild diffuse coarse chronic interstitial opacity. IMPRESSION: No active disease. Electronically Signed   By: Donavan Foil M.D.   On: 07/09/2020 21:30   DG Shoulder Left Port  Result Date: 07/09/2020 CLINICAL DATA:  Left arm pain swelling and erythema from shoulder EXAM: LEFT SHOULDER COMPARISON:  05/12/2020, CT 06/18/2020 FINDINGS: Marked lateral soft tissue swelling over the shoulder, probably related to complex joint effusion noted on previous imaging. Irregular AC joint arthropathy. Erosive changes of the glenoid. Free-floating acromial fragment, suspect worsened erosive changes at the scapula. IMPRESSION: Marked lateral soft tissue swelling over the shoulder, probably related to complex joint effusion noted on previous imaging. Erosive changes of the glenoid and suspected worsened erosive changes at the scapula and AC joint. There appears to be a free-floating acromial fragment. Findings could be secondary to inflammatory arthropathy versus infection. Findings appear progressive as compared with prior radiograph 05/02/2020. Electronically Signed   By: Donavan Foil M.D.   On: 07/09/2020 21:39   ECHOCARDIOGRAM COMPLETE  Result Date: 07/10/2020    ECHOCARDIOGRAM REPORT   Patient Name:   JELAN BATTERTON Date of Exam: 07/10/2020 Medical Rec #:  [id number]   Height:       72.0 in Accession #:    [id number]  Weight:       203.9 lb Date of  Birth:  30-Jul-1946   BSA:          2.148 m Patient Age:    85 years    BP:           78/86 mmHg Patient Gender: M           HR:           89 bpm. Exam Location:  Inpatient Procedure: 2D Echo, Intracardiac Opacification Agent, Color Doppler and Cardiac            Doppler Indications:    Dyspnea 786.09 / R06.00  History:        Patient has prior history of Echocardiogram examinations, most                 recent 09/19/2015. CAD, Prior CABG, PAD and COPD,                 Signs/Symptoms:Fever and Hypotension; Risk Factors:Diabetes,                 Hypertension and Dyslipidemia. Septic shock likely from septic                 arthritis of left shoulder.  Sonographer:    Darlina Sicilian RDCS Referring Phys: 60 VINEET SOOD IMPRESSIONS  1. Technically difficult study with limited views  2. Left ventricular ejection fraction, by estimation, is 50 to 55%. The left ventricle has low normal function. Left ventricular diastolic parameters were normal.  3. Right ventricular systolic function is normal. The right ventricular size is mildly enlarged.  4. Left atrial size was mildly dilated.  5. The mitral valve is normal in structure. Mild mitral valve regurgitation.  6. The aortic valve is tricuspid. Aortic valve regurgitation is not visualized. Mild to moderate aortic valve sclerosis/calcification is present, without any evidence of aortic stenosis. FINDINGS  Left Ventricle: Left ventricular ejection fraction, by estimation, is 50 to 55%. The left ventricle has low normal function. The left ventricle has no regional wall motion abnormalities. Definity contrast agent  was given IV to delineate the left ventricular endocardial borders. The left ventricular internal cavity size was mildly dilated. There is no left ventricular hypertrophy. Left ventricular diastolic parameters were normal. Right Ventricle: The right ventricular size is mildly enlarged. Right vetricular wall thickness was not assessed. Right ventricular systolic  function is normal. Left Atrium: Left atrial size was mildly dilated. Right Atrium: Right atrial size was not well visualized. Pericardium: There is no evidence of pericardial effusion. Mitral Valve: The mitral valve is normal in structure. Mild mitral valve regurgitation. Tricuspid Valve: The tricuspid valve is normal in structure. Tricuspid valve regurgitation is trivial. Aortic Valve: The aortic valve is tricuspid. Aortic valve regurgitation is not visualized. Mild to moderate aortic valve sclerosis/calcification is present, without any evidence of aortic stenosis. Pulmonic Valve: The pulmonic valve was not well visualized. Pulmonic valve regurgitation is not visualized. Aorta: The aortic root was not well visualized. IAS/Shunts: The interatrial septum was not well visualized.   LV Volumes (MOD) LV vol d, MOD A2C: 271.0 ml Diastology LV vol d, MOD A4C: 258.0 ml LV e' medial:    8.38 cm/s LV vol s, MOD A2C: 154.0 ml LV E/e' medial:  13.1 LV vol s, MOD A4C: 117.5 ml LV e' lateral:   18.30 cm/s LV SV MOD A2C:     117.0 ml LV E/e' lateral: 6.0 LV SV MOD A4C:     258.0 ml LV SV MOD BP:      129.3 ml RIGHT VENTRICLE TAPSE (M-mode): 2.1 cm LEFT ATRIUM             Index LA Vol (A2C):   77.5 ml 36.08 ml/m LA Vol (A4C):   85.2 ml 39.66 ml/m LA Biplane Vol: 82.6 ml 38.45 ml/m  AORTIC VALVE LVOT Vmax:   110.00 cm/s LVOT Vmean:  80.000 cm/s LVOT VTI:    0.206 m MITRAL VALVE                TRICUSPID VALVE MV Area (PHT): 5.62 cm     TR Peak grad:   22.3 mmHg MV Decel Time: 135 msec     TR Vmax:        236.00 cm/s MV E velocity: 110.00 cm/s MV A velocity: 80.00 cm/s   SHUNTS MV E/A ratio:  1.38         Systemic VTI: 0.21 m Oswaldo Milian MD Electronically signed by Oswaldo Milian MD Signature Date/Time: 07/10/2020/1:49:09 PM    Final    VAS [place] LOWER EXTREMITY VENOUS (DVT)  Result Date: 07/11/2020  Lower Venous DVT Study Indications: Swelling.  Comparison Study: No prior studies. Performing Technologist:  Darlin Coco, RDMS  Examination Guidelines: A complete evaluation includes B-mode imaging, spectral Doppler, color Doppler, and power Doppler as needed of all accessible portions of each vessel. Bilateral testing is considered an integral part of a complete examination. Limited examinations for reoccurring indications may be performed as noted. The reflux portion of the exam is performed with the patient in reverse Trendelenburg.  +---------+---------------+---------+-----------+----------+--------------+ RIGHT    CompressibilityPhasicitySpontaneityPropertiesThrombus Aging +---------+---------------+---------+-----------+----------+--------------+ CFV      Full           Yes      Yes                                 +---------+---------------+---------+-----------+----------+--------------+ SFJ      Full                                                        +---------+---------------+---------+-----------+----------+--------------+  FV Prox  Full                                                        +---------+---------------+---------+-----------+----------+--------------+ FV Mid   Full                                                        +---------+---------------+---------+-----------+----------+--------------+ FV DistalFull                                                        +---------+---------------+---------+-----------+----------+--------------+ PFV      Full                                                        +---------+---------------+---------+-----------+----------+--------------+ POP      Full           Yes      Yes                                 +---------+---------------+---------+-----------+----------+--------------+ PTV      Full                                                        +---------+---------------+---------+-----------+----------+--------------+ PERO     Full                                                         +---------+---------------+---------+-----------+----------+--------------+   +----+---------------+---------+-----------+----------+--------------+ LEFTCompressibilityPhasicitySpontaneityPropertiesThrombus Aging +----+---------------+---------+-----------+----------+--------------+ CFV Full           Yes      Yes                                 +----+---------------+---------+-----------+----------+--------------+     Summary: RIGHT: - There is no evidence of deep vein thrombosis in the lower extremity.  - No cystic structure found in the popliteal fossa.  LEFT: - No evidence of common femoral vein obstruction.  *See table(s) above for measurements and observations.    Preliminary     LABORATORY DATA: Lab Results  Component Value Date   WBC 10.4 07/11/2020   HGB 9.1 (L) 07/11/2020   HCT 27.7 (L) 07/11/2020   MCV 88.5 07/11/2020   PLT 355 07/11/2020    Recent Labs  Lab 07/11/20 0425  NA 129*  K 4.3  CL 100  CO2 19*  BUN 13  CREATININE 0.60*  CALCIUM 8.1*  PROT 6.4*  BILITOT 1.0  ALKPHOS 113  ALT 13  AST 20  GLUCOSE 143*    Lipid Panel     Component Value Date/Time   CHOL 139 03/21/2018 0810   TRIG 71 03/21/2018 0810   HDL 37 (L) 03/21/2018 0810   CHOLHDL 3.8 03/21/2018 0810   VLDL 17.0 11/15/2014 1003   LDLCALC 86 03/21/2018 0810    BNP (last 3 results) Recent Labs    11/23/19 0724 05/02/20 1620 07/11/20 0425  BNP 362.7* 626.2* 2,329.1*    HEMOGLOBIN A1C Lab Results  Component Value Date   HGBA1C 7.2 (H) 05/12/2020   MPG 159.94 05/12/2020    Cardiac Panel (last 3 results) Recent Labs    07/11/20 0425 07/11/20 0640 07/11/20 1110  TROPONINIHS 103* 225* 338*   TSH Recent Labs    07/09/20 2356  TSH 0.996      CARDIAC DATABASE: Coronary artery bypass grafting 09/2015: LIMA to the LAD, sequential SVG graft to diagonal OM, SVG to PDA  EKG: 07/09/2020: Sinus tachycardia, normal axis, consider old inferior infarct, without  underlying injury pattern.  07/11/2020 3:50 AM: Sinus tachycardia, 132 bpm, normal axis, consider old inferior infarct, ST depressions in the lateral precordial leads V4-V6 suggestive of ischemia, rare PVCs.  07/11/2020 8:50 AM: Sinus tachycardia, 123 bpm, normal axis, consider old inferior infarct, without underlying injury pattern.  Compared to prior EKG from the same day at 3:50 AM ST depressions have improved.  Echocardiogram: 07/10/2020 1. Technically difficult study with limited views 2. Left ventricular ejection fraction, by estimation, is 50 to 55%. The left ventricle has low normal function. Left ventricular diastolic parameters were normal. 3. Right ventricular systolic function is normal. The right ventricular size is mildly enlarged. 4. Left atrial size was mildly dilated. 5. The mitral valve is normal in structure. Mild mitral valve regurgitation. 6. The aortic valve is tricuspid. Aortic valve regurgitation is not visualized. Mild to moderate aortic valve sclerosis/calcification is present, without any evidence of aortic stenosis.  IMPRESSION & RECOMMENDATIONS: Kyshawn Teal is a 74 y.o. Caucasian male whose past medical history and cardiovascular risk factors include: Established CAD with prior CABG in 2017, PAD status post aortobifemoral bypass by Dr. 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  Non-STEMI: Most likely secondary to supply demand ischemia given his underlying CAD in the setting of sepsis, recent surgery, tachycardia, hypervolemia due to sepsis. -Clinically patient does have substernal chest discomfort suggestive angina pectoris but the intensity is not as severe as in the past and the discomfort is not his anginal equivalent.  -His troponins are up trending.  Of note, in March 2021 his high  sensitive troponin was 1131.  Continue to trend troponins until they peak.  -EKG early this morning noted sinus tachycardia with ST depressions in the lateral leads suggestive of ischemia.  Since then repeat EKG shows improvement in ST depressions as his ventricular rate improves. -Agree with initiation of IV heparin which was started earlier this morning.  Pharmacy to dose. -Patient was started on Levophed due to underlying sepsis; however, recommend weaning off Levophed as his SBP at the time of the evaluation was 163mHg. This will prevent further vasoconstriction and reduce afterload which should improve his symptoms. -Uptitrate nitro drip for chest pain. -Continue aspirin and restarted on Brilinta. -Restart his antianginal therapy: Home dose of  carvedilol and Imdur. -Bumex 1 mg IV twice daily -Restart Entresto once sepsis has improved/resolved, will defer to critical care medicine further recommendations -Echocardiogram notes low normal EF at 50-55% without mention of regional wall motion abnormalities.  No significant valvular heart disease. -Patient follows up with Dr. Laurance Flatten from [facility] and based on his last encounter with him that is available from Care Everywhere patient has establish CAD with stable angina at baseline.  I do not have any angiograms to review but based on the office notes it appears that he has severe native CAD and his surgical revascularization anatomy notes patency of the LIMA to the LAD graft, sequential graft to the diagonal/ramus is patent but jump graft to the obtuse marginal branches occluded, SVG to PDA is occluded as well.  In conclusion, the LAD distribution is being supplied by the LIMA and the SVG to RI/Diagonal branch, RCA is revascularized by collaterals from the LAD, only LCX distribution is not well revascularized. It appears that prior attempts were made to revascularize the LCX distribution but it was unsuccessful.  Therefore the EKG  findings noted earlier this morning and coronary anatomy as alluded by Dr. Tawanna Sat the last office note are concordant. -No acute intervention needed at this time as majority of his symptoms are due to his underlying CAD in the setting of sepsis leading to supply demand ischemia. -Focus on improving his systolic blood pressures and heart rate this should improve his symptoms. -Case discussed with intensivist Dr. Carlis Abbott. -No family was present at bedside.  Therefore, I offered to call them at his request to update them from a cardiology perspective.  Patient states that he is divorced, one son has passed away, and the other son named Corene Cornea he wishes that we do not contact him at this time.  That his discretion I have not updated the family.  Secondary Diagnosis: Septic shock due to septic arthritis of the left shoulder and strep bacteremia: Currently on antibiotics, managed by infectious disease and critical care medicine.  Appreciate their recommendations.  Peripheral artery disease status post aortobifemoral bypass: Educated on importance of risk factor modifications.  Continue aspirin.  Reinitiate pravastatin once cleared by CCM.  Also on PCSK9 inhibitors as outpatient.  History of left carotid endarterectomy: Risk factor modifications.  Patient is on aspirin, statins, and PCSK9 inhibitors as outpatient.  Hypertension: Patient was hypertensive on admission due to septic shock.  Currently requiring vasopressor support. Now being weaned off.  Hyperlipidemia: Last lipid profile reviewed from care everywhere. Patient is on pravastatin at home, recommend restarting it once cleared by CCM. Also takes PCSK9 inhibitors as at home.  Type 2 diabetes mellitus: Follow sugars.  Per ICU protocol.   Time spent: 89 mins *Total Encounter Time as defined by the Centers for Medicare and Medicaid Services includes, in addition to the face-to-face time of a patient visit (documented in the note above)  non-face-to-face time: obtaining and reviewing outside history from [place], reviewing cardiac imaging studies, ordering and reviewing medications, tests or procedures, care coordination (communications with other health care professionals or caregivers) and documentation in the medical record.  Patient's questions and concerns were addressed to his satisfaction. He voices understanding of the instructions provided during this encounter.   This note was created using a voice recognition software as a result there may be grammatical errors inadvertently enclosed that do not reflect the nature of this encounter. Every attempt is made to correct such errors.   [place], DO, [facility]  Pager: [phone] Office: [phone] 07/11/2020, 12:28 PM

## 2020-07-11 NOTE — Plan of Care (Signed)
Discussed with patient plan of care for the evening, pain management and why he needs to eat with some teach back displayed at this time.  Problem: Respiratory: Goal: Ability to maintain adequate ventilation will improve Outcome: Progressing

## 2020-07-11 NOTE — Progress Notes (Signed)
CRITICAL VALUE ALERT  Critical Value:  501  Date & Time Notied:  07/11/20 @ 2235  Provider Notified:  PCCM Via C. Nadara Mustard RN  Orders Received/Actions taken: Pending. Will continue to monitor

## 2020-07-11 NOTE — Progress Notes (Signed)
CRITICAL VALUE ALERT  Critical Value:  Troponin 368  Date & Time Notied:  11/26 1440  Provider Notified: Dr. Carlis Abbott [id number]

## 2020-07-11 NOTE — Progress Notes (Signed)
CRITICAL VALUE ALERT  Critical Value: Troponin 338  Date & Time Notied:  11/26 1205  Provider Notified: Algie Coffer

## 2020-07-11 NOTE — Progress Notes (Signed)
Subjective: 1 Day Post-Op Procedure(s) (LRB): SHOULDER ARTHROSCOPY WITH WASHOUT (Left)  Patient reports that he his shoulder feels better this morning.  Denies fever, chills, N/V.  States that he is ready to eat.   Objective:   VITALS:  Temp:  [97.6 F (36.4 C)-99.4 F (37.4 C)] 99.3 F (37.4 C) (11/26 0830) Pulse Rate:  [79-135] 103 (11/26 0600) Resp:  [15-27] 21 (11/26 0600) BP: (93-114)/(46-88) 103/46 (11/25 1230) SpO2:  [77 %-100 %] 98 % (11/26 0735) Arterial Line BP: (80-130)/(46-80) 110/61 (11/26 0600)  General: WDWN patient in NAD. Psych:  Appropriate mood and affect. Neuro:  A&O x 3, Moving all extremities, sensation intact to light touch HEENT:  EOMs intact Chest:  Even non-labored respirations Skin:  Dressing C/D/I, no rashes or lesions.  Drain intact with moderate serosanginuous drainage. Extremities: warm/dry, mild edema to L shoulder, no visible erythema or echymosis.  No lymphadenopathy. Pulses: Radial 2+ MSK:  ROM: Full wrist/elbow ROM, MMT: 4/5 grip strength   LABS Recent Labs    07/09/20 2105 07/09/20 2105 07/10/20 0253 07/11/20 0425  HGB 10.5*  --  9.7* 9.1*  WBC 11.6*   < > 10.1 10.4  PLT 360   < > 311 355   < > = values in this interval not displayed.   Recent Labs    07/10/20 0253 07/11/20 0425  NA 128* 129*  K 4.1 4.3  CL 99 100  CO2 20* 19*  BUN 23 13  CREATININE 0.79 0.60*  GLUCOSE 107* 143*   Recent Labs    07/09/20 2105  INR 1.3*     Assessment/Plan: 1 Day Post-Op Procedure(s) (LRB): SHOULDER ARTHROSCOPY WITH WASHOUT (Left)  Patient seen in rounds for Dr. Onnie Graham Reinforce dressing prn May use L UE for transfers.  Encourage ROM of elbow, wrist, and hand. D/C drain when less than 25 cc of output per shift. ABX per ID recommendation. Critical care inquires about initiating Heparin.  Ok from Ortho standpoint.  Mechele Claude PA-C [facility] Office:  [phone]

## 2020-07-11 NOTE — Progress Notes (Signed)
Pt is tachycardic in 130s, has increased work of breathing, anxiety, and complains of chest pain in mid chest. E-Link notified. Following chest pain protocol and provider orders

## 2020-07-11 NOTE — Progress Notes (Signed)
[facility] for Infectious Disease  Date of Admission:  07/09/2020     Total days of antibiotics: 3         Current antibiotics: Day 3 vancomycin 11/24 Day 2 cefepime 11/25  Previous antibiotics: Ceftriaxone 11/24--> 11/25    Reason for visit: Follow up on septic arthritis shoulder and bacteremia from GBS   ASSESSMENT AND PLAN:   [name] is a 74 y.o. 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  Found to have severe sepsis related to left shoulder infection s/p arthroscopic lavage and debridement 07/10/20 with blood cultures positive for Group B Strep.  # Left shoulder infection s/p debridement # Group B Strep bacteremia # Diabetes # NSTEMI  -- stop vancomycin and cefepime  -- ceftriaxone 2 gm daily -- follow up synovial fluid cultures.  If they remain negative anticipate de-escalating to PCN -- wound care, glycemic control -- NSTEMI per cardiology/CCM     SUBJECTIVE:   11/24 blood cx positive for GBS 11/24 fluid cx from shoulder NGTD Blood cx repeated today  Chest pain noted overnight, found to have NSTEMI.   Review of Systems: As noted above.  All other systems reviewed and are negative.   OBJECTIVE:   Allergies  Allergen Reactions  . Ivp Dye [Iodinated Diagnostic Agents]     Blood Pressure dropped----pt was pre-medicated with 13 hour prep and did fine with pre-meds--amy 03/09/11   . Penicillins Nausea Only    REACTION: stomach pain(IV ok) Has patient had a PCN reaction causing immediate rash, facial/tongue/throat swelling, SOB or lightheadedness with hypotension: No Has patient had a PCN reaction causing severe rash involving mucus  membranes or skin necrosis: No Has patient had a PCN reaction that required hospitalization: No Has patient had a PCN reaction occurring within the last 10 years: No If all of the above answers are "NO", then may proceed with Cephalosporin use.   . Sulfa Antibiotics Hives  . Sulfonamide Derivatives Hives and Itching  . Lisinopril Cough  . Metoprolol Nausea And Vomiting  . Statins Other (See Comments)    myalgias  . Propoxyphene N-Acetaminophen Other (See Comments)    Sharp pains- headache    Blood pressure (!) 146/63, pulse (!) 115, temperature 99.2 F (37.3 C), temperature source Axillary, resp. rate (!) 26, height 6' (1.829 m), weight 92.5 kg, SpO2 96 %. Body mass index is 27.66 kg/m.  Physical Exam Constitutional:      Comments: Chronically ill appearing man, lying in bed, sleeping.  HENT:     Head: Normocephalic and atraumatic.  Pulmonary:     Effort: Pulmonary effort is normal. No respiratory distress.  Abdominal:     General: Abdomen is flat.     Palpations: Abdomen is soft.  Musculoskeletal:        General: Deformity present.     Comments: Left shoulder with large bandage over surgical site and drain in place. Partial amputation of right long finger.    Skin:    General: Skin is warm and dry.  Neurological:     General: No focal deficit present.       Lab Results & Microbiology Lab Results  Component Value Date   WBC 10.4 07/11/2020   HGB  9.1 (L) 07/11/2020   HCT 27.7 (L) 07/11/2020   MCV 88.5 07/11/2020   PLT 355 07/11/2020    Lab Results  Component Value Date   NA 129 (L) 07/11/2020   K 4.3 07/11/2020   CO2 19 (L) 07/11/2020   GLUCOSE 143 (H) 07/11/2020   BUN 13 07/11/2020   CREATININE 0.60 (L) 07/11/2020   CALCIUM 8.1 (L) 07/11/2020   GFRNONAA >60 07/11/2020   GFRAA 45 (L) 05/04/2020    Lab Results  Component Value Date   ALT 13 07/11/2020   AST 20 07/11/2020   ALKPHOS 113 07/11/2020   BILITOT 1.0 07/11/2020     I have reviewed the  micro and lab results in Epic.  Imaging DG Chest Port 1 View  Result Date: 07/11/2020 CLINICAL DATA:  Chest pain and increasing shortness of breath EXAM: PORTABLE CHEST 1 VIEW COMPARISON:  07/09/2020 FINDINGS: Postoperative changes in the mediastinum. Normal heart size. Developing linear atelectasis in the right mid lung and lung bases. No focal consolidation. No pleural effusions. No pneumothorax. Calcification of the aorta. IMPRESSION: Developing linear atelectasis in the right mid lung and lung bases. Electronically Signed   By: Lucienne Capers M.D.   On: 07/11/2020 04:49   DG Chest Port 1 View  Result Date: 07/09/2020 CLINICAL DATA:  Questionable sepsis EXAM: PORTABLE CHEST 1 VIEW COMPARISON:  05/02/2020, 12/31/2019 FINDINGS: Post sternotomy changes. No focal opacity or pleural effusion. Stable cardiomediastinal silhouette. No pneumothorax. Mild diffuse coarse chronic interstitial opacity. IMPRESSION: No active disease. Electronically Signed   By: Donavan Foil M.D.   On: 07/09/2020 21:30   DG Shoulder Left Port  Result Date: 07/09/2020 CLINICAL DATA:  Left arm pain swelling and erythema from shoulder EXAM: LEFT SHOULDER COMPARISON:  05/12/2020, CT 06/18/2020 FINDINGS: Marked lateral soft tissue swelling over the shoulder, probably related to complex joint effusion noted on previous imaging. Irregular AC joint arthropathy. Erosive changes of the glenoid. Free-floating acromial fragment, suspect worsened erosive changes at the scapula. IMPRESSION: Marked lateral soft tissue swelling over the shoulder, probably related to complex joint effusion noted on previous imaging. Erosive changes of the glenoid and suspected worsened erosive changes at the scapula and AC joint. There appears to be a free-floating acromial fragment. Findings could be secondary to inflammatory arthropathy versus infection. Findings appear progressive as compared with prior radiograph 05/02/2020. Electronically Signed   By:  Donavan Foil M.D.   On: 07/09/2020 21:39   ECHOCARDIOGRAM COMPLETE  Result Date: 07/10/2020    ECHOCARDIOGRAM REPORT   Patient Name:   [name] Date of Exam: 07/10/2020 Medical Rec #:  [id number]   Height:       72.0 in Accession #:    [id number]  Weight:       203.9 lb Date of Birth:  [birth year]   BSA:          2.148 m Patient Age:    73 years    BP:           78/86 mmHg Patient Gender: M           HR:           89 bpm. Exam Location:  Inpatient Procedure: 2D Echo, Intracardiac Opacification Agent, Color Doppler and Cardiac            Doppler Indications:    Dyspnea 786.09 / R06.00  History:        Patient has prior history of Echocardiogram examinations, most  recent 09/19/2015. CAD, Prior CABG, PAD and COPD,                 Signs/Symptoms:Fever and Hypotension; Risk Factors:Diabetes,                 Hypertension and Dyslipidemia. Septic shock likely from septic                 arthritis of left shoulder.  Sonographer:    Darlina Sicilian RDCS Referring Phys: 77 VINEET SOOD IMPRESSIONS  1. Technically difficult study with limited views  2. Left ventricular ejection fraction, by estimation, is 50 to 55%. The left ventricle has low normal function. Left ventricular diastolic parameters were normal.  3. Right ventricular systolic function is normal. The right ventricular size is mildly enlarged.  4. Left atrial size was mildly dilated.  5. The mitral valve is normal in structure. Mild mitral valve regurgitation.  6. The aortic valve is tricuspid. Aortic valve regurgitation is not visualized. Mild to moderate aortic valve sclerosis/calcification is present, without any evidence of aortic stenosis. FINDINGS  Left Ventricle: Left ventricular ejection fraction, by estimation, is 50 to 55%. The left ventricle has low normal function. The left ventricle has no regional wall motion abnormalities. Definity contrast agent was given IV to delineate the left ventricular endocardial borders. The left  ventricular internal cavity size was mildly dilated. There is no left ventricular hypertrophy. Left ventricular diastolic parameters were normal. Right Ventricle: The right ventricular size is mildly enlarged. Right vetricular wall thickness was not assessed. Right ventricular systolic function is normal. Left Atrium: Left atrial size was mildly dilated. Right Atrium: Right atrial size was not well visualized. Pericardium: There is no evidence of pericardial effusion. Mitral Valve: The mitral valve is normal in structure. Mild mitral valve regurgitation. Tricuspid Valve: The tricuspid valve is normal in structure. Tricuspid valve regurgitation is trivial. Aortic Valve: The aortic valve is tricuspid. Aortic valve regurgitation is not visualized. Mild to moderate aortic valve sclerosis/calcification is present, without any evidence of aortic stenosis. Pulmonic Valve: The pulmonic valve was not well visualized. Pulmonic valve regurgitation is not visualized. Aorta: The aortic root was not well visualized. IAS/Shunts: The interatrial septum was not well visualized.   LV Volumes (MOD) LV vol d, MOD A2C: 271.0 ml Diastology LV vol d, MOD A4C: 258.0 ml LV e' medial:    8.38 cm/s LV vol s, MOD A2C: 154.0 ml LV E/e' medial:  13.1 LV vol s, MOD A4C: 117.5 ml LV e' lateral:   18.30 cm/s LV SV MOD A2C:     117.0 ml LV E/e' lateral: 6.0 LV SV MOD A4C:     258.0 ml LV SV MOD BP:      129.3 ml RIGHT VENTRICLE TAPSE (M-mode): 2.1 cm LEFT ATRIUM             Index LA Vol (A2C):   77.5 ml 36.08 ml/m LA Vol (A4C):   85.2 ml 39.66 ml/m LA Biplane Vol: 82.6 ml 38.45 ml/m  AORTIC VALVE LVOT Vmax:   110.00 cm/s LVOT Vmean:  80.000 cm/s LVOT VTI:    0.206 m MITRAL VALVE                TRICUSPID VALVE MV Area (PHT): 5.62 cm     TR Peak grad:   22.3 mmHg MV Decel Time: 135 msec     TR Vmax:        236.00 cm/s MV E velocity: 110.00 cm/s MV A velocity: 80.00  cm/s   SHUNTS MV E/A ratio:  1.38         Systemic VTI: 0.21 m Oswaldo Milian  MD Electronically signed by Oswaldo Milian MD Signature Date/Time: 07/10/2020/1:49:09 PM    Final    VAS [place] LOWER EXTREMITY VENOUS (DVT)  Result Date: 07/11/2020  Lower Venous DVT Study Indications: Swelling.  Comparison Study: No prior studies. Performing Technologist: Darlin Coco, RDMS  Examination Guidelines: A complete evaluation includes B-mode imaging, spectral Doppler, color Doppler, and power Doppler as needed of all accessible portions of each vessel. Bilateral testing is considered an integral part of a complete examination. Limited examinations for reoccurring indications may be performed as noted. The reflux portion of the exam is performed with the patient in reverse Trendelenburg.  +---------+---------------+---------+-----------+----------+--------------+ RIGHT    CompressibilityPhasicitySpontaneityPropertiesThrombus Aging +---------+---------------+---------+-----------+----------+--------------+ CFV      Full           Yes      Yes                                 +---------+---------------+---------+-----------+----------+--------------+ SFJ      Full                                                        +---------+---------------+---------+-----------+----------+--------------+ FV Prox  Full                                                        +---------+---------------+---------+-----------+----------+--------------+ FV Mid   Full                                                        +---------+---------------+---------+-----------+----------+--------------+ FV DistalFull                                                        +---------+---------------+---------+-----------+----------+--------------+ PFV      Full                                                        +---------+---------------+---------+-----------+----------+--------------+ POP      Full           Yes      Yes                                  +---------+---------------+---------+-----------+----------+--------------+ PTV      Full                                                        +---------+---------------+---------+-----------+----------+--------------+  PERO     Full                                                        +---------+---------------+---------+-----------+----------+--------------+   +----+---------------+---------+-----------+----------+--------------+ LEFTCompressibilityPhasicitySpontaneityPropertiesThrombus Aging +----+---------------+---------+-----------+----------+--------------+ CFV Full           Yes      Yes                                 +----+---------------+---------+-----------+----------+--------------+     Summary: RIGHT: - There is no evidence of deep vein thrombosis in the lower extremity.  - No cystic structure found in the popliteal fossa.  LEFT: - No evidence of common femoral vein obstruction.  *See table(s) above for measurements and observations.    [facility]ious [facility] [phone] pager 07/11/2020, 1:53 PM

## 2020-07-11 NOTE — Progress Notes (Signed)
[facility] Progress Note Patient Name: [name] DOB: [birth year] MRN: [id number]   Date of Service  07/11/2020  HPI/Events of Note  Patient with frequent ectopy, magnesium level is 1.8, K+ 4.3.  eICU Interventions  Magnesium sulfate 2 gm iv x 1        Kasyn Rolph U Shenita Trego 07/11/2020, 5:18 AM

## 2020-07-11 NOTE — Progress Notes (Signed)
NAD noted. BiPAP is not needed for WOB. Patient declines nocturnal use tonight. RN aware

## 2020-07-11 NOTE — Progress Notes (Signed)
CRITICAL VALUE ALERT  Critical Value:  Troponin 441  Date & Time Notied:  11/26 1730  Provider Notified: Dr. Carlis Abbott

## 2020-07-11 NOTE — Progress Notes (Signed)
ANTICOAGULATION CONSULT NOTE -  Consult  Pharmacy Consult for IV heparin Indication: chest pain/ACS  Allergies  Allergen Reactions  . Ivp Dye [Iodinated Diagnostic Agents]     Blood Pressure dropped----pt was pre-medicated with 13 hour prep and did fine with pre-meds--amy 03/09/11   . Penicillins Nausea Only    REACTION: stomach pain(IV ok) Has patient had a PCN reaction causing immediate rash, facial/tongue/throat swelling, SOB or lightheadedness with hypotension: No Has patient had a PCN reaction causing severe rash involving mucus membranes or skin necrosis: No Has patient had a PCN reaction that required hospitalization: No Has patient had a PCN reaction occurring within the last 10 years: No If all of the above answers are "NO", then may proceed with Cephalosporin use.   . Sulfa Antibiotics Hives  . Sulfonamide Derivatives Hives and Itching  . Lisinopril Cough  . Metoprolol Nausea And Vomiting  . Statins Other (See Comments)    myalgias  . Propoxyphene N-Acetaminophen Other (See Comments)    Sharp pains- headache    Patient Measurements: Height: 6' (182.9 cm) Weight: 92.5 kg (203 lb 14.8 oz) IBW/kg (Calculated) : 77.6 Heparin Dosing Weight:   Vital Signs: Temp: 99.4 F (37.4 C) (11/26 0000) Temp Source: Oral (11/26 0000) Pulse Rate: 109 (11/26 0421)  Labs: Recent Labs    07/09/20 2105 07/09/20 2105 07/10/20 0253 07/11/20 0425  HGB 10.5*   < > 9.7* 9.1*  HCT 32.0*  --  29.5* 27.7*  PLT 360  --  311 355  APTT 38*  --   --   --   LABPROT 15.3*  --   --   --   INR 1.3*  --   --   --   CREATININE 1.08  --  0.79 0.60*  TROPONINIHS  --   --   --  103*   < > = values in this interval not displayed.    Estimated Creatinine Clearance: 88.9 mL/min (A) (by C-G formula based on SCr of 0.6 mg/dL (L)).   Medical History: Past Medical History:  Diagnosis Date  . Anemia    NOS  . Anxiety   . 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 BPH (benign  prostatic hypertrophy)   . Broken foot Oct. 12, 2013   Right foot Fx  . CAD (coronary artery disease)   . Carotid stenosis, left   . Cellulitis and abscess of leg 03/2018   right leg  . Chronic hyponatremia   . Chronic pain syndrome   . COPD (chronic obstructive pulmonary disease) ([place])   . Depression   . Diabetes mellitus   . Diabetic foot ulcer ([place])   . DJD (degenerative joint disease)    L wrist  . DM2 (diabetes mellitus, type 2) ([place])   . GERD (gastroesophageal reflux disease)   . HLD (hyperlipidemia)   . HTN (hypertension)   . Hx of blood transfusion reaction   . Hyponatremia   . Lumbar disc disease   . MI (mitral incompetence)   . Myocardial infarction ([place])    x 2  . Osteoporosis    tx per [facility], Dr Prudencio Burly, thought due to heavy steriod use after 1978  . Peripheral neuropathy   . Peripheral vascular disease ([place])   . Restless leg syndrome   . Spine fracture    hx, multiple  . Varicose veins   . 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 Vitamin D deficiency  Medications:  Scheduled:  . aspirin EC  81 mg Oral Daily  . Chlorhexidine Gluconate Cloth  6 each Topical Daily  . docusate sodium  100 mg Oral BID  . furosemide  60 mg Intravenous Once  . insulin aspart  0-15 Units Subcutaneous Q4H  . mouth rinse  15 mL Mouth Rinse q12n4p  . mouth rinse  15 mL Mouth Rinse BID  . pantoprazole (PROTONIX) IV  40 mg Intravenous Q24H  . rOPINIRole  1 mg Oral TID  . tamsulosin  0.4 mg Oral QPC supper  . ticagrelor  90 mg Oral BID    Assessment: Pharmacy is consulted to dose heparin in 74 yo male diagnosed with ACS. Pt complaining of CP with SOB and diaphoresis over night. 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 Currently on aspirin 81 mg PO daily and ticagrelor 90 mg PO BID. Not on any blood thinners PTA.    Today, 07/11/20  Hgb 9.1 low but stable  Plt 355   Scr  < 1     Goal of Therapy:  Heparin level 0.3-0.7 units/ml Monitor platelets by anticoagulation protocol: Yes   Plan:   Heparin 4000 unit bolus followed by heparin 1200 unit/hr infusion   Obtain HL 8 hours after start of infusion   Daily CBC while on heparin   Monitor for signs and symptoms of bleeding, esp as pt is on DAPT   Royetta Asal, PharmD, BCPS 07/11/2020 7:47 AM

## 2020-07-11 NOTE — Progress Notes (Signed)
Lower extremity venous RT study completed.  Preliminary results relayed to RN.   See CV Proc for preliminary results report.   Malichi Palardy, RDMS  

## 2020-07-11 NOTE — Progress Notes (Signed)
[place] Progress Note Patient Name: [name] DOB: 04/22/1946 MRN: [id number]   Date of Service  07/11/2020  HPI/Events of Note  Patient c/o precordial chest pain and shortness of breath, he was also diaphoretic.  eICU Interventions  Discontinue iv fluids, Morphine 1-2 mg iv Q 5 minutes (total of 6 mg given), Nitroglycerine infusion started @ 5 mcg, Lopressor 2.5 mg iv x 1 given, stat portable CXR ordered to r/o pulmonary edema, patient placed on BIPAP , cardiac enzymes cycled, other labs sent.        Kerry Kass Dyllin Gulley 07/11/2020, 4:29 AM

## 2020-07-12 ENCOUNTER — Inpatient Hospital Stay (HOSPITAL_COMMUNITY): Payer: Medicare Other

## 2020-07-12 DIAGNOSIS — A419 Sepsis, unspecified organism: Secondary | ICD-10-CM | POA: Diagnosis not present

## 2020-07-12 DIAGNOSIS — J9600 Acute respiratory failure, unspecified whether with hypoxia or hypercapnia: Secondary | ICD-10-CM

## 2020-07-12 DIAGNOSIS — R6520 Severe sepsis without septic shock: Secondary | ICD-10-CM | POA: Diagnosis not present

## 2020-07-12 LAB — TROPONIN I (HIGH SENSITIVITY)
Troponin I (High Sensitivity): 304 ng/L (ref <18)
Troponin I (High Sensitivity): 284 ng/L (ref <18)

## 2020-07-12 LAB — URINE CULTURE: Culture: 10000 — AB

## 2020-07-12 LAB — GLUCOSE, CAPILLARY
Glucose-Capillary: 134 mg/dL — ABNORMAL HIGH (ref 70–99)
Glucose-Capillary: 132 mg/dL — ABNORMAL HIGH (ref 70–99)
Glucose-Capillary: 156 mg/dL — ABNORMAL HIGH (ref 70–99)
Glucose-Capillary: 179 mg/dL — ABNORMAL HIGH (ref 70–99)
Glucose-Capillary: 163 mg/dL — ABNORMAL HIGH (ref 70–99)

## 2020-07-12 LAB — BASIC METABOLIC PANEL
Anion gap: 9 (ref 5–15)
Anion gap: 11 (ref 5–15)
BUN: 17 mg/dL (ref 8–23)
BUN: 22 mg/dL (ref 8–23)
CO2: 24 mmol/L (ref 22–32)
CO2: 22 mmol/L (ref 22–32)
Calcium: 7.9 mg/dL — ABNORMAL LOW (ref 8.9–10.3)
Calcium: 8.1 mg/dL — ABNORMAL LOW (ref 8.9–10.3)
Chloride: 98 mmol/L (ref 98–111)
Chloride: 97 mmol/L — ABNORMAL LOW (ref 98–111)
Creatinine, Ser: 0.58 mg/dL — ABNORMAL LOW (ref 0.61–1.24)
Creatinine, Ser: 0.78 mg/dL (ref 0.61–1.24)
GFR, Estimated: >60 mL/min (ref >60)
GFR, Estimated: >60 mL/min (ref >60)
Glucose, Bld: 131 mg/dL — ABNORMAL HIGH (ref 70–99)
Glucose, Bld: 181 mg/dL — ABNORMAL HIGH (ref 70–99)
Potassium: 3.7 mmol/L (ref 3.5–5.1)
Potassium: 3.8 mmol/L (ref 3.5–5.1)
Sodium: 131 mmol/L — ABNORMAL LOW (ref 135–145)
Sodium: 130 mmol/L — ABNORMAL LOW (ref 135–145)

## 2020-07-12 LAB — BODY FLUID CULTURE

## 2020-07-12 LAB — HEPARIN LEVEL (UNFRACTIONATED)
Heparin Unfractionated: <0.1 IU/mL — ABNORMAL LOW (ref 0.30–0.70)
Heparin Unfractionated: 0.22 IU/mL — ABNORMAL LOW (ref 0.30–0.70)

## 2020-07-12 LAB — CULTURE, BLOOD (ROUTINE X 2): Special Requests: ADEQUATE

## 2020-07-12 LAB — CBC
HCT: 23.2 % — ABNORMAL LOW (ref 39.0–52.0)
Hemoglobin: 7.9 g/dL — ABNORMAL LOW (ref 13.0–17.0)
MCH: 29.4 pg (ref 26.0–34.0)
MCHC: 34.1 g/dL (ref 30.0–36.0)
MCV: 86.2 fL (ref 80.0–100.0)
Platelets: 320 10*3/uL (ref 150–400)
RBC: 2.69 MIL/uL — ABNORMAL LOW (ref 4.22–5.81)
RDW: 16.6 % — ABNORMAL HIGH (ref 11.5–15.5)
WBC: 9.2 10*3/uL (ref 4.0–10.5)
nRBC: 0 % (ref 0.0–0.2)

## 2020-07-12 LAB — HEMOGLOBIN AND HEMATOCRIT, BLOOD
HCT: 27.9 % — ABNORMAL LOW (ref 39.0–52.0)
Hemoglobin: 9.2 g/dL — ABNORMAL LOW (ref 13.0–17.0)

## 2020-07-12 LAB — PREPARE RBC (CROSSMATCH)

## 2020-07-12 LAB — MAGNESIUM: Magnesium: 1.9 mg/dL (ref 1.7–2.4)

## 2020-07-12 MED ORDER — FUROSEMIDE 10 MG/ML IJ SOLN
40.0000 mg | Freq: Once | INTRAMUSCULAR | Status: AC
  Administered 2020-07-12: 40 mg via INTRAVENOUS
  Filled 2020-07-12: qty 4

## 2020-07-12 MED ORDER — ACETAMINOPHEN 650 MG RE SUPP
650.0000 mg | RECTAL | Status: DC | PRN
  Administered 2020-07-12: 650 mg via RECTAL
  Filled 2020-07-12: qty 1

## 2020-07-12 MED ORDER — PENICILLIN G POTASSIUM 20000000 UNITS IJ SOLR
12.0000 10*6.[IU] | Freq: Two times a day (BID) | INTRAVENOUS | Status: DC
  Administered 2020-07-12 – 2020-07-16 (×8): 12 10*6.[IU] via INTRAVENOUS
  Filled 2020-07-12 (×9): qty 12

## 2020-07-12 MED ORDER — HEPARIN (PORCINE) 25000 UT/250ML-% IV SOLN
2000.0000 [IU]/h | INTRAVENOUS | Status: DC
  Administered 2020-07-12 (×2): 2000 [IU]/h via INTRAVENOUS
  Filled 2020-07-12: qty 250

## 2020-07-12 MED ORDER — HEPARIN BOLUS VIA INFUSION
2500.0000 [IU] | Freq: Once | INTRAVENOUS | Status: AC
  Administered 2020-07-12: 2500 [IU] via INTRAVENOUS
  Filled 2020-07-12: qty 2500

## 2020-07-12 MED ORDER — GUAIFENESIN 100 MG/5ML PO SOLN
5.0000 mL | ORAL | Status: DC | PRN
  Administered 2020-07-12 – 2020-07-13 (×3): 100 mg via ORAL
  Filled 2020-07-12 (×3): qty 10

## 2020-07-12 MED ORDER — HEPARIN (PORCINE) 25000 UT/250ML-% IV SOLN
1800.0000 [IU]/h | INTRAVENOUS | Status: DC
  Administered 2020-07-12 (×2): 1800 [IU]/h via INTRAVENOUS
  Filled 2020-07-12: qty 250

## 2020-07-12 NOTE — Progress Notes (Signed)
Progress Note  Patient Name: [name] Date of Encounter: 07/12/2020  Attending physician: Tanda Rockers, MD Primary care provider: Patrecia Pour, Christean Grief, MD Primary Cardiologist: Dr. Laurance Flatten at [facility].  Former Cardiologist: Dr. Einar Gip (last seen 2012) Consultant:Laken Lobato, DO  Subjective: [name] is a 74 y.o. male who was seen and examined at bedside at approximately 120pm No events overnight Patient denies any chest pain. He complains of mild shortness of breath at rest. Denies orthopnea, paroxysmal nocturnal dyspnea or lower extremity swelling. Currently getting blood transfusion. Case discussed and reviewed with his nurse.  Objective: Vital Signs in the last 24 hours: Temp:  [97.6 F (36.4 C)-99.4 F (37.4 C)] 98.7 F (37.1 C) (11/27 1315) Pulse Rate:  [85-112] 99 (11/27 1315) Resp:  [17-30] 24 (11/27 1315) BP: (82-186)/(42-139) 82/49 (11/27 0900) SpO2:  [94 %-100 %] 100 % (11/27 1315) Arterial Line BP: (75-102)/(42-58) 89/54 (11/27 1315) Weight:  [91 kg] 91 kg (11/27 0515)  Intake/Output:  Intake/Output Summary (Last 24 hours) at 07/12/2020 1342 Last data filed at 07/12/2020 1200 Gross per 24 hour  Intake 1074.77 ml  Output 910 ml  Net 164.77 ml    Net IO Since Admission: 0.51 mL [07/12/20 1342]  Weights:  Filed Weights   07/09/20 2017 07/10/20 0130 07/12/20 0515  Weight: 86.2 kg 92.5 kg 91 kg    Telemetry: Personally reviewed.  Normal sinus rhythm with rare PVCs and NSVT.  Physical examination: PHYSICAL EXAM: Vitals with BMI 07/12/2020 07/12/2020 07/12/2020  Height - - -  Weight - - -  BMI - - -  Systolic - - 82  Diastolic - - 49  Pulse 99 95 91    CONSTITUTIONAL: Age-appropriate male, hemodynamically stable, mild distress. SKIN: Skin is warm and dry. No rash noted. No cyanosis. No pallor. No jaundice HEAD: Normocephalic and atraumatic.  EYES: No scleral icterus MOUTH/THROAT: Moist oral membranes.   NECK: No JVD present. No thyromegaly noted.  LYMPHATIC: No visible cervical adenopathy.  CHEST Normal respiratory effort. No intercostal retractions  LUNGS:  Rhonchi is noted bilaterally.  No stridor. No wheezes.   CARDIOVASCULAR: Regular,  positive J8-H6, soft holosystolic murmur heard at the apex, no gallops or rubs. ABDOMINAL: Soft, nontender, nondistended, positive bowel sounds in all 4 quadrants, no apparent ascites.  EXTREMITIES: No peripheral edema.  2+ bilateral femoral pulses.  Diminished popliteal and posterior tibial pulses bilaterally.  Warm to touch bilaterally. HEMATOLOGIC: No significant bruising NEUROLOGIC: Oriented to person, place, and time. Nonfocal. Normal muscle tone.  PSYCHIATRIC: Normal mood and affect. Normal behavior. Cooperative  Lab Results: Hematology Recent Labs  Lab 07/10/20 0253 07/11/20 0425 07/12/20 0422  WBC 10.1 10.4 9.2  RBC 3.25* 3.13* 2.69*  HGB 9.7* 9.1* 7.9*  HCT 29.5* 27.7* 23.2*  MCV 90.8 88.5 86.2  MCH 29.8 29.1 29.4  MCHC 32.9 32.9 34.1  RDW 16.8* 16.6* 16.6*  PLT 311 355 320    Chemistry Recent Labs  Lab 07/09/20 2105 07/09/20 2105 07/10/20 0253 07/11/20 0425 07/12/20 0422  NA 124*   < > 128* 129* 131*  K 5.1   < > 4.1 4.3 3.7  CL 92*   < > 99 100 98  CO2 21*   < > 20* 19* 24  GLUCOSE 219*   < > 107* 143* 131*  BUN 29*   < > 23 13 17   CREATININE 1.08   < > 0.79 0.60* 0.58*  CALCIUM 9.0   < > 8.2* 8.1* 7.9*  PROT 7.8  --  6.2* 6.4*  --   ALBUMIN 3.2*  --  2.6* 2.6*  --   AST 19  --  15 20  --   ALT 16  --  13 13  --   ALKPHOS 125  --  106 113  --   BILITOT 0.8  --  0.6 1.0  --   GFRNONAA >60   < > >60 >60 >60  ANIONGAP 11   < > 9 10 9    < > = values in this interval not displayed.     Cardiac Enzymes: Cardiac Panel (last 3 results) Recent Labs    07/11/20 2056 07/12/20 0422 07/12/20 0940  TROPONINIHS 501* 304* 284*    BNP (last 3 results) Recent Labs    11/23/19 0724 05/02/20 1620 07/11/20 0425  BNP  362.7* 626.2* 2,329.1*    ProBNP (last 3 results) No results for input(s): PROBNP in the last 8760 hours.   DDimer No results for input(s): DDIMER in the last 168 hours.   Hemoglobin A1c:  Lab Results  Component Value Date   HGBA1C 7.2 (H) 05/12/2020   MPG 159.94 05/12/2020    TSH  Recent Labs    07/09/20 2356  TSH 0.996    Lipid Panel     Component Value Date/Time   CHOL 139 03/21/2018 0810   TRIG 71 03/21/2018 0810   HDL 37 (L) 03/21/2018 0810   CHOLHDL 3.8 03/21/2018 0810   VLDL 17.0 11/15/2014 1003   LDLCALC 86 03/21/2018 0810   LDLDIRECT 108.2 03/13/2010 1340    Imaging: DG Chest Port 1 View  Result Date: 07/11/2020 CLINICAL DATA:  Chest pain and increasing shortness of breath EXAM: PORTABLE CHEST 1 VIEW COMPARISON:  07/09/2020 FINDINGS: Postoperative changes in the mediastinum. Normal heart size. Developing linear atelectasis in the right mid lung and lung bases. No focal consolidation. No pleural effusions. No pneumothorax. Calcification of the aorta. IMPRESSION: Developing linear atelectasis in the right mid lung and lung bases. Electronically Signed   By: Lucienne Capers M.D.   On: 07/11/2020 04:49   VAS [place] LOWER EXTREMITY VENOUS (DVT)  Result Date: 07/11/2020  Lower Venous DVT Study Indications: Swelling.  Comparison Study: No prior studies. Performing Technologist: Darlin Coco, RDMS  Examination Guidelines: A complete evaluation includes B-mode imaging, spectral Doppler, color Doppler, and power Doppler as needed of all accessible portions of each vessel. Bilateral testing is considered an integral part of a complete examination. Limited examinations for reoccurring indications may be performed as noted. The reflux portion of the exam is performed with the patient in reverse Trendelenburg.  +---------+---------------+---------+-----------+----------+--------------+ RIGHT    CompressibilityPhasicitySpontaneityPropertiesThrombus Aging  +---------+---------------+---------+-----------+----------+--------------+ CFV      Full           Yes      Yes                                 +---------+---------------+---------+-----------+----------+--------------+ SFJ      Full                                                        +---------+---------------+---------+-----------+----------+--------------+ FV Prox  Full                                                        +---------+---------------+---------+-----------+----------+--------------+  FV Mid   Full                                                        +---------+---------------+---------+-----------+----------+--------------+ FV DistalFull                                                        +---------+---------------+---------+-----------+----------+--------------+ PFV      Full                                                        +---------+---------------+---------+-----------+----------+--------------+ POP      Full           Yes      Yes                                 +---------+---------------+---------+-----------+----------+--------------+ PTV      Full                                                        +---------+---------------+---------+-----------+----------+--------------+ PERO     Full                                                        +---------+---------------+---------+-----------+----------+--------------+   +----+---------------+---------+-----------+----------+--------------+ LEFTCompressibilityPhasicitySpontaneityPropertiesThrombus Aging +----+---------------+---------+-----------+----------+--------------+ CFV Full           Yes      Yes                                 +----+---------------+---------+-----------+----------+--------------+     Summary: RIGHT: - There is no evidence of deep vein thrombosis in the lower extremity.  - No cystic structure found in the popliteal fossa.   LEFT: - No evidence of common femoral vein obstruction.  *See table(s) above for measurements and observations. Electronically signed by Jamelle Haring on 07/11/2020 at 2:52:37 PM.    Final     Cardiac database: EKG: 07/09/2020: Sinus tachycardia, normal axis, consider old inferior infarct, without underlying injury pattern.  07/11/2020 3:50 AM: Sinus tachycardia, 132 bpm, normal axis, consider old inferior infarct, ST depressions in the lateral precordial leads V4-V6 suggestive of ischemia, rare PVCs.  07/11/2020 8:50 AM: Sinus tachycardia, 123 bpm, normal axis, consider old inferior infarct, without underlying injury pattern.  Compared to prior EKG from the same day at 3:50 AM ST depressions have improved.  Echocardiogram: 07/10/2020 1. Technically difficult study with limited views 2. Left ventricular ejection fraction, by estimation, is 50 to 55%. The left ventricle has low normal function. Left ventricular diastolic parameters were normal. 3.  Right ventricular systolic function is normal. The right ventricular size is mildly enlarged. 4. Left atrial size was mildly dilated. 5. The mitral valve is normal in structure. Mild mitral valve regurgitation. 6. The aortic valve is tricuspid. Aortic valve regurgitation is not visualized. Mild to moderate aortic valve sclerosis/calcification is present, without any evidence of aortic stenosis.  Scheduled Meds: . aspirin EC  81 mg Oral Daily  . Chlorhexidine Gluconate Cloth  6 each Topical Daily  . docusate sodium  100 mg Oral BID  . insulin aspart  0-15 Units Subcutaneous Q4H  . isosorbide mononitrate  60 mg Oral Daily  . mouth rinse  15 mL Mouth Rinse q12n4p  . mouth rinse  15 mL Mouth Rinse BID  .  morphine injection  2 mg Intravenous Once  . pantoprazole (PROTONIX) IV  40 mg Intravenous Q24H  . rOPINIRole  1 mg Oral TID  . ticagrelor  90 mg Oral BID    Continuous Infusions: . sodium chloride Stopped (07/10/20 0748)  . heparin 1,800  Units/hr (07/12/20 1200)  . norepinephrine (LEVOPHED) Adult infusion 6 mcg/min (07/12/20 1200)  . penicillin g continuous IV infusion      PRN Meds: albuterol, bisacodyl, magnesium citrate, menthol-cetylpyridinium **OR** phenol, metoCLOPramide **OR** metoCLOPramide (REGLAN) injection, morphine injection, ondansetron **OR** ondansetron (ZOFRAN) IV, oxyCODONE, polyethylene glycol   IMPRESSION & RECOMMENDATIONS: [name] is a 74 y.o. male whose past medical history and cardiac risk factors include: Established CAD with prior CABG in 2017, PAD status post aortobifemoral bypass by Dr. 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  Non-STEMI: Most likely secondary to supply demand ischemia given his underlying CAD in the setting of sepsis, recent surgery, tachycardia, hypervolemia due to sepsis. -Since yesterday patient states that his chest pain has resolved. -His underlying blood pressure as well as heart rate has also improved since last encounter. -Troponins have started to downtrend, peak troponin at 501 -Nitroglycerin drip held as of this morning due to low blood pressures and no chest pain. -Restarted on low-dose Levophed due to hypotension and sepsis.   -Continue aspirin and restarted on Brilinta. -Heart failure medications are currently being held due to underlying sepsis.  Recommending restarting once sepsis resolves. -Recommend restarting carvedilol at a lower dose given his underlying CAD, NSTEMI, and NSVT on telemetry. -Recommend low-dose parenteral diuretics to help improve congestion.  Diuretics currently managed by CCM in setting of underlying sepsis. -Patient follows up with Dr. Laurance Flatten from [place] and based on his last encounter with him that is available from [facility] Everywhere  patient has establish CAD with stable angina at baseline.  I do not have any angiograms to review but based on the office notes it appears that he has severe native CAD and his surgical revascularization anatomy notes patency of the LIMA to the LAD graft, sequential graft to the diagonal/ramus is patent but jump graft to the obtuse marginal branches occluded, SVG to PDA is occluded as well.  In conclusion, the LAD distribution is being supplied by the LIMA and the SVG to RI/Diagonal branch, RCA is revascularized by collaterals from the LAD, only LCX distribution is not well revascularized. It appears that prior attempts were made to revascularize the LCX distribution but it was unsuccessful.  Therefore the prior EKG findings and coronary anatomy as alluded by Dr. Tawanna Sat the last office note  are concordant. -No acute intervention needed at this time as majority of his symptoms are due to his underlying CAD in the setting of sepsis leading to supply demand ischemia. -Recommend the patient follows up with Dr. Laurance Flatten upon discharge to continue his longitudinal cardiovascular care.  Established CAD status post CABG without angina pectoris:  Continue Imdur Recommend restarting beta-blocker therapy once hemodynamics improve. Recommend restarting Entresto once off of vasopressor support and sepsis has resolved. Continue aspirin and Brilinta. Currently receiving blood transfusion as morning hemoglobin 7.9 g/dL. Recommend restarting home dose of pravastatin 20 mg p.o. nightly.  Secondary Diagnosis: Septic shock due to septic arthritis of the left shoulder and strep bacteremia: Currently on antibiotics, managed by infectious disease and critical care medicine.  Appreciate their recommendations.  Peripheral artery disease status post aortobifemoral bypass: Educated on importance of risk factor modifications.  Continue aspirin.  Reinitiate pravastatin once cleared by CCM.  Also on PCSK9 inhibitors as  outpatient.  History of left carotid endarterectomy:  Continue aspirin. Recommend reinitiation of statins.  Patient also on PCSK9 inhibitors as outpatient.  Hypertension: Currently requiring vasopressor support.  Currently managed by CCM.  Hyperlipidemia: Patient is on pravastatin at home, recommend restarting it once cleared by CCM. Also takes PCSK9 inhibitors as at home.  Type 2 diabetes mellitus: Follow sugars.  Per ICU protocol.  Patient's questions and concerns were addressed to his satisfaction. He voices understanding of the instructions provided during this encounter.   This note was created using a voice recognition software as a result there may be grammatical errors inadvertently enclosed that do not reflect the nature of this encounter. Every attempt is made to correct such errors.  Rex Kras, DO, [facility]. [place] Office: [phone] 07/12/2020, 1:42 PM

## 2020-07-12 NOTE — Progress Notes (Signed)
Subjective:  Patient reports pain as moderate to severe.  Denies N/V/CP/SOB. C/o L should pain. States this shoulder has hurt ever since the [employer] implanted a device in him.  Objective:   VITALS:   Vitals:   07/12/20 0730 07/12/20 0745 07/12/20 0800 07/12/20 0815  BP: (!) 150/92 (!) 166/75 (!) 160/61 (!) 186/62  Pulse:   (!) 105   Resp:   19   Temp:      TempSrc:      SpO2:  100% 100%   Weight:      Height:       HV: approx 40 cc / shift    NAD ABD soft Intact pulses distally Incision: scant drainage Compartment soft (+) AIN, PIN, U SILT   Lab Results  Component Value Date   WBC 9.2 07/12/2020   HGB 7.9 (L) 07/12/2020   HCT 23.2 (L) 07/12/2020   MCV 86.2 07/12/2020   PLT 320 07/12/2020   BMET    Component Value Date/Time   NA 131 (L) 07/12/2020 0422   NA 132 (A) 12/01/2017 0000   K 3.7 07/12/2020 0422   CL 98 07/12/2020 0422   CO2 24 07/12/2020 0422   GLUCOSE 131 (H) 07/12/2020 0422   BUN 17 07/12/2020 0422   BUN 13 12/01/2017 0000   CREATININE 0.58 (L) 07/12/2020 0422   CREATININE 0.86 06/28/2018 1525   CALCIUM 7.9 (L) 07/12/2020 0422   CALCIUM 9.3 10/17/2009 2256   GFRNONAA >60 07/12/2020 0422   GFRNONAA 87 06/28/2018 1525   GFRAA 45 (L) 05/04/2020 1132   GFRAA 100 06/28/2018 1525   Recent Results (from the past 240 hour(s))  Blood Culture (routine x 2)     Status: None (Preliminary result)   Collection Time: 07/09/20  9:05 PM   Specimen: BLOOD LEFT FOREARM  Result Value Ref Range Status   Specimen Description   Final    BLOOD LEFT FOREARM Performed at [facility], [place] [address], [place], [place] [address]    Special Requests   Final    BOTTLES DRAWN AEROBIC AND ANAEROBIC Blood Culture results may not be optimal due to an excessive volume of blood received in culture bottles Performed at [facility] [address] [place], [place] [address]    Culture   Final    NO GROWTH 2 DAYS Performed at  [facility] [address] [place], [place] [address]    Report Status PENDING  Incomplete  Blood Culture (routine x 2)     Status: Abnormal   Collection Time: 07/09/20  9:05 PM   Specimen: BLOOD RIGHT FOREARM  Result Value Ref Range Status   Specimen Description   Final    BLOOD RIGHT FOREARM Performed at [facility] [address] [place], [place] [address]    Special Requests   Final    BOTTLES DRAWN AEROBIC AND ANAEROBIC Blood Culture adequate volume Performed at [facility] [address] [place], [place] [address]    Culture  Setup Time   Final    GRAM POSITIVE COCCI IN CHAINS IN BOTH AEROBIC AND ANAEROBIC BOTTLES CRITICAL RESULT CALLED TO, READ BACK BY AND VERIFIED WITHDomenick Gong PHARMD [id number] 07/10/20 A BROWNING Performed at [facility], [place] [address], [place],  [address]    Culture GROUP B STREP(S.AGALACTIAE)ISOLATED (A)  Final   Report Status 07/12/2020 FINAL  Final   Organism ID, Bacteria GROUP B STREP(S.AGALACTIAE)ISOLATED  Final      Susceptibility   Group b strep(s.agalactiae)isolated - MIC*    CLINDAMYCIN >=1 RESISTANT Resistant     AMPICILLIN <=0.25 SENSITIVE Sensitive     ERYTHROMYCIN >=8 RESISTANT Resistant     VANCOMYCIN 0.5 SENSITIVE Sensitive     CEFTRIAXONE <=0.12 SENSITIVE Sensitive     LEVOFLOXACIN 0.5 SENSITIVE Sensitive     PENICILLIN Value in next row Sensitive      SENSITIVE0.06    * GROUP B STREP(S.AGALACTIAE)ISOLATED  Blood Culture ID Panel (Reflexed)     Status: Abnormal   Collection Time: 07/09/20  9:05 PM  Result Value Ref Range Status   Enterococcus faecalis NOT DETECTED NOT DETECTED Final   Enterococcus Faecium NOT DETECTED NOT DETECTED Final   Listeria monocytogenes NOT DETECTED NOT DETECTED Final   Staphylococcus species NOT DETECTED NOT DETECTED Final   Staphylococcus aureus (BCID) NOT DETECTED NOT DETECTED Final   Staphylococcus epidermidis NOT DETECTED NOT DETECTED  Final   Staphylococcus lugdunensis NOT DETECTED NOT DETECTED Final   Streptococcus species DETECTED (A) NOT DETECTED Final    Comment: CRITICAL RESULT CALLED TO, READ BACK BY AND VERIFIED WITH: S CHRISTY PHARMD 1631 07/10/20 A BROWNING    Streptococcus agalactiae DETECTED (A) NOT DETECTED Final    Comment: CRITICAL RESULT CALLED TO, READ BACK BY AND VERIFIED WITH: S CHRISTY PHARMD 1631 07/10/20 A BROWNING    Streptococcus pneumoniae NOT DETECTED NOT DETECTED Final   Streptococcus pyogenes NOT DETECTED NOT DETECTED Final   A.calcoaceticus-baumannii NOT DETECTED NOT DETECTED Final   Bacteroides fragilis NOT DETECTED NOT DETECTED Final   Enterobacterales NOT DETECTED NOT DETECTED Final   Enterobacter cloacae complex NOT DETECTED NOT DETECTED Final   Escherichia coli NOT DETECTED NOT DETECTED Final   Klebsiella aerogenes NOT DETECTED NOT DETECTED Final   Klebsiella oxytoca NOT DETECTED NOT DETECTED Final   Klebsiella pneumoniae NOT DETECTED NOT DETECTED Final   Proteus species NOT DETECTED NOT DETECTED Final   Salmonella species NOT DETECTED NOT DETECTED Final   Serratia marcescens NOT DETECTED NOT DETECTED Final   Haemophilus influenzae NOT DETECTED NOT DETECTED Final   Neisseria meningitidis NOT DETECTED NOT DETECTED Final   Pseudomonas aeruginosa NOT DETECTED NOT DETECTED Final   Stenotrophomonas maltophilia NOT DETECTED NOT DETECTED Final   Candida albicans NOT DETECTED NOT DETECTED Final   Candida auris NOT DETECTED NOT DETECTED Final   Candida glabrata NOT DETECTED NOT DETECTED Final   Candida krusei NOT DETECTED NOT DETECTED Final   Candida parapsilosis NOT DETECTED NOT DETECTED Final   Candida tropicalis NOT DETECTED NOT DETECTED Final   Cryptococcus neoformans/gattii NOT DETECTED NOT DETECTED Final    Comment: Performed at [facility], [address], [place], [place] [address]  Resp Panel by RT-PCR (Flu A&B, Covid) Nasopharyngeal Swab     Status: None   Collection  Time: 07/09/20  9:06 PM   Specimen: Nasopharyngeal Swab; Nasopharyngeal(NP) swabs in vial transport medium  Result Value Ref Range Status   SARS Coronavirus 2 by RT PCR NEGATIVE NEGATIVE Final    Comment: (NOTE) SARS-CoV-2 target nucleic acids are NOT DETECTED.  The SARS-CoV-2 RNA is generally detectable in upper respiratory specimens during the acute phase of infection. The lowest concentration of SARS-CoV-2 viral copies this assay can detect is 138 copies/mL. A negative result does not preclude SARS-Cov-2 infection and should not be used as the sole basis for treatment or other patient management decisions. A negative result may occur with  improper specimen collection/handling, submission  of specimen other than nasopharyngeal swab, presence of viral mutation(s) within the areas targeted by this assay, and inadequate number of viral copies(<138 copies/mL). A negative result must be combined with clinical observations, patient history, and epidemiological information. The expected result is Negative.  Fact Sheet for Patients:  EntrepreneurPulse.com.au  Fact Sheet for Healthcare Providers:  IncredibleEmployment.be  This test is no t yet approved or cleared by the [place] FDA and  has been authorized for detection and/or diagnosis of SARS-CoV-2 by FDA under an Emergency Use Authorization (EUA). This EUA will remain  in effect (meaning this test can be used) for the duration of the COVID-19 declaration under Section 564(b)(1) of the Act, 21 U.S.C.section 360bbb-3(b)(1), unless the authorization is terminated  or revoked sooner.       Influenza A by PCR NEGATIVE NEGATIVE Final   Influenza B by PCR NEGATIVE NEGATIVE Final    Comment: (NOTE) The Xpert Xpress SARS-CoV-2/FLU/RSV plus assay is intended as an aid in the diagnosis of influenza from Nasopharyngeal swab specimens and should not be used as a sole basis for treatment. Nasal washings  and aspirates are unacceptable for Xpert Xpress SARS-CoV-2/FLU/RSV testing.  Fact Sheet for Patients: EntrepreneurPulse.com.au  Fact Sheet for Healthcare Providers: IncredibleEmployment.be  This test is not yet approved or cleared by the [place] FDA and has been authorized for detection and/or diagnosis of SARS-CoV-2 by FDA under an Emergency Use Authorization (EUA). This EUA will remain in effect (meaning this test can be used) for the duration of the COVID-19 declaration under Section 564(b)(1) of the Act, 21 U.S.C. section 360bbb-3(b)(1), unless the authorization is terminated or revoked.  Performed at [facility], [place] [address] [place], [place] [address]   Body fluid culture     Status: None (Preliminary result)   Collection Time: 07/09/20 10:00 PM   Specimen: Synovium; Body Fluid  Result Value Ref Range Status   Specimen Description   Final    SYNOVIAL LEFT SHOULDER Performed at [facility] [address] [place], [place] [address]    Special Requests   Final    NONE Performed at [facility], [place] [address], [place], [place] [address]    Gram Stain   Final    MODERATE WBC PRESENT, PREDOMINANTLY PMN NO ORGANISMS SEEN RESULT CALLED TO, READ BACK BY AND VERIFIED WITH: RN J DAVISON AT 2251 07/09/20 CRUICKSHANK A Performed at [facility], [place] [address], [place], [place] [address]    Culture   Final    RARE GROUP B STREP(S.AGALACTIAE)ISOLATED TESTING AGAINST S. AGALACTIAE NOT ROUTINELY PERFORMED DUE TO PREDICTABILITY OF AMP/PEN/VAN SUSCEPTIBILITY. CRITICAL RESULT CALLED TO, READ BACK BY AND VERIFIED WITH: A. MANSFIELD RN, AT 1093 07/11/20 BY D. VANHOOK REGARDING GROWTH Performed at [facility], [place] [address], [place], [place] [address]    Report Status PENDING  Incomplete  Urine culture     Status: Abnormal (Preliminary result)    Collection Time: 07/10/20  1:00 AM   Specimen: In/Out Cath Urine  Result Value Ref Range Status   Specimen Description   Final    IN/OUT CATH URINE Performed at [facility] [address], [place], [place] [address]    Special Requests   Final    NONE Performed at [facility], [place] [address], [place], [place] [address]    Culture (A)  Final    10,000 COLONIES/mL PSEUDOMONAS AERUGINOSA 10,000 COLONIES/mL STREPTOCOCCUS AGALACTIAE TESTING AGAINST S.  AGALACTIAE NOT ROUTINELY PERFORMED DUE TO PREDICTABILITY OF AMP/PEN/VAN SUSCEPTIBILITY. SUSCEPTIBILITIES TO FOLLOW Performed at [facility], [place] [address], [place], [place] [address]    Report Status PENDING  Incomplete  MRSA PCR Screening     Status: None   Collection Time: 07/10/20  1:15 AM   Specimen: Nasopharyngeal  Result Value Ref Range Status   MRSA by PCR NEGATIVE NEGATIVE Final    Comment:        The GeneXpert MRSA Assay (FDA approved for NASAL specimens only), is one component of a comprehensive MRSA colonization surveillance program. It is not intended to diagnose MRSA infection nor to guide or monitor treatment for MRSA infections. Performed at [facility], [place] [address], [place], [place] [address]   Culture, blood (routine x 2)     Status: None (Preliminary result)   Collection Time: 07/11/20  9:55 AM   Specimen: BLOOD  Result Value Ref Range Status   Specimen Description   Final    BLOOD LEFT ANTECUBITAL Performed at [facility] [address] [place], [place] [address]    Special Requests   Final    BOTTLES DRAWN AEROBIC ONLY Blood Culture adequate volume Performed at [facility] [address], [place], [place] [address]    Culture   Final    NO GROWTH < 24 HOURS Performed at [facility] [address] [place], [place] [address]    Report Status PENDING  Incomplete  Culture, blood  (routine x 2)     Status: None (Preliminary result)   Collection Time: 07/11/20 10:01 AM   Specimen: BLOOD LEFT HAND  Result Value Ref Range Status   Specimen Description   Final    BLOOD LEFT HAND Performed at [facility] [address], [place], [place] [address]    Special Requests   Final    BOTTLES DRAWN AEROBIC ONLY Blood Culture adequate volume Performed at [facility] [address] [place], [place] [address]    Culture   Final    NO GROWTH < 24 HOURS Performed at [facility] [address] [place], [place] [address]    Report Status PENDING  Incomplete      Assessment/Plan: 2 Days Post-Op   Active Problems:   Peripheral artery disease ([facility])   Hx of CABG   Severe sepsis (HCC)   Shortness of breath   Status post aortobifemoral bypass surgery   History of carotid endarterectomy   Type 2 diabetes mellitus with hyperlipidemia (HCC)   Diabetes mellitus type II, non insulin dependent ([place])   Reinforce dressing prn May use L UE for transfers.  Encourage ROM of elbow, wrist, and hand. D/C drain when less than 25 cc of output per shift. ABX per ID recommendation.   Hilton Cork Nethaniel Mattie 07/12/2020, 8:36 AM   Rod Can, MD [phone] [facility] now [facility]  [place] [address] [place], [place], [place] [address] Phone: [phone] [email or web address] Facebook  [employer]

## 2020-07-12 NOTE — Progress Notes (Signed)
Pharmacy: [facility]  Patient's a 74 y.o M currently on heparin drip for NSTEMI.   - heparin level remains sub-therapeutic 0.22 with rate adjusted to 1800 units/hr this morning - per pt's RN, no bleeding noted - s/p left shoulder lavage and debridement on on 11/25. hgb 7.9 this morning --> s/p 1 unit PRBC this afternoon  Goal of Therapy:  Heparin level 0.3-0.7 units/ml Monitor platelets by anticoagulation protocol: Yes    Plan: - increase heparin drip to 2000 units/hr - check 8 hr heparin level - monitor for s/sx bleeding  Dia Sitter, PharmD, BCPS 07/12/2020 4:47 PM

## 2020-07-12 NOTE — Progress Notes (Signed)
[facility] Progress Note Patient Name: [name] DOB: Dec 27, 1945 MRN: [id number]   Date of Service  07/12/2020  HPI/Events of Note  Patient with some subtle confusion that is new per RN who had him last night. Patient noted to be febrile at present. Not hypoglycemic.   Also reviewed EKG which is unchanged from prior.   eICU Interventions  Give rectal tylenol for fever to see if this helps with his mild increase in confusion.     Intervention Category Minor Interventions: Routine modifications to care plan (e.g. PRN medications for pain, fever)  Marily Lente Kaeya Schiffer 07/12/2020, 11:45 PM

## 2020-07-12 NOTE — Progress Notes (Signed)
NAME:  [name], MRN:  [id number], DOB:  August 13, 1946, LOS: 3 ADMISSION DATE:  07/09/2020, CONSULTATION DATE: 07/09/2020 REFERRING MD:  Dr. Regenia Skeeter, ER, CHIEF COMPLAINT:  Sepsis   Brief History   74 yo male former smoker with hx of chronic lt shoulder pain requiring arthrocentesis developed weakness, fever 102F, and hypotension (BP 74/57).  Found to have swollen/red left arm.  Had arthrocentesis in ER and started on antibiotics.  PCCM asked to admit to ICU to manage sepsis.  History of present illness   Patient lethargic and not able to provide accurate history.  History from chart and ER staff.  Past Medical History  Vit D deficiency, Varicose veins, RLS, PAD, Peripheral neuropathy, Osteoporosis, CAD with NSTEMI in 2021--  Not fully revascularized, Hyponatremia, HTN, HLD, DM type 2, Diabetic foot, Depression, COPD, Chronic pain, BPH, Blind Lt eye, Anxiety, GERD, BPH  Significant Hospital Events   11/24 Admit  Consults:  [facility]- [facility]   11/26  ID  11/25   Procedures:  I&D left shoulder 11/25  Significant Diagnostic Tests:  Lt shoulder arthrocentesis 11/24 >> brown/turbid, WBC 30,597 (96% neutrophils) Transthoracic echo 11/25> LVEF 47-42%, normal diastolic function. Left atrial dilation. Mildly enlarged RV. Mild MR. Ultrasound right lower extremity 11/26 > neg  Micro Data:  COVID/Flu 11/24 >> negative Lt shoulder synovial fluid 11/24 >> strep agalactiae Blood 11/24 >> strep agalactiae sensitive to rocephin BC x 2 11/24 >>> Urine 11/25>>  Pseudomonas/pansensitive including rocephin MRSA PCR 11/25 >> negative BC x 2  11/26 >>>  Antimicrobials:  Vancomycin 11/24 >> 11/26  Rocephin on 11/24 then restarted 11/26 >>> Cefepime 11/25 > 11/26       Scheduled Meds: . aspirin EC  81 mg Oral Daily  . bumetanide (BUMEX) IV  1 mg Intravenous Q12H  . carvedilol  6.25 mg Oral BID WC  . Chlorhexidine Gluconate Cloth  6 each Topical Daily  . docusate  sodium  100 mg Oral BID  . insulin aspart  0-15 Units Subcutaneous Q4H  . isosorbide mononitrate  60 mg Oral Daily  . mouth rinse  15 mL Mouth Rinse q12n4p  . mouth rinse  15 mL Mouth Rinse BID  .  morphine injection  2 mg Intravenous Once  . pantoprazole (PROTONIX) IV  40 mg Intravenous Q24H  . rOPINIRole  1 mg Oral TID  . tamsulosin  0.4 mg Oral QPC supper  . ticagrelor  90 mg Oral BID   Continuous Infusions: . sodium chloride Stopped (07/10/20 0748)  . cefTRIAXone (ROCEPHIN)  IV Stopped (07/11/20 1854)  . heparin 1,800 Units/hr (07/12/20 0844)  . nitroGLYCERIN 5 mcg/min (07/12/20 0844)  . norepinephrine (LEVOPHED) Adult infusion Stopped (07/11/20 1233)   PRN Meds:.albuterol, bisacodyl, diphenhydrAMINE, fentaNYL (SUBLIMAZE) injection, magnesium citrate, menthol-cetylpyridinium **OR** phenol, metoCLOPramide **OR** metoCLOPramide (REGLAN) injection, morphine injection, ondansetron **OR** ondansetron (ZOFRAN) IV, oxyCODONE, polyethylene glycol  Interim history/subjective:   started on coreg by cards and off levophed as bp up 11/26 but this am  back with low bp  - no cp or sob   Objective   Blood pressure (!) 82/49, pulse 91, temperature 98.7 F (37.1 C), temperature source Oral, resp. rate 19, height 6' (1.829 m), weight 91 kg, SpO2 100 %.        Intake/Output Summary (Last 24 hours) at 07/12/2020 0924 Last data filed at 07/12/2020 0844 Gross per 24 hour  Intake 1431.29 ml  Output 2710 ml  Net -1278.71 ml   Filed Weights   07/09/20 2017  07/10/20 0130 07/12/20 0515  Weight: 86.2 kg 92.5 kg 91 kg    Examination: Tmax 99.4  On 2lpm NP  sats 100%  Pt somewhat slowed mentation, talking about the FBI and microwaves causing his problems No jvd Oropharynx clear,  mucosa nl Neck supple Lungs with a few scattered exp > insp rhonchi bilaterally RRR no s3 or or sign murmur Abd soft/ nl  excursion  Extr warm with no edema or clubbing noted Neuro    no apparent motor deficits      I personally reviewed images and   impression as follows:  CXR:   11/26  Portable  Cm/ mild edema     Resolved Hospital Problem list     Assessment & Plan:   Septic shock due to septic arthritis of left shoulder and strep bacteremia -Cont rocephin per ID  -Appreciate ortho's assistance for source control. -resume low dose levophed am 11/27 and hold coreg/ imdur/diuretics for now     NSTEMI, likely demand related to sepsis, recent surgery, hypervolemia. 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 Con't aspirin, restarted brilinta 11/26 . -Morphine 2 mg IV as needed for chest pain -Holding heart failure meds for now given shock >>>  restart Imdur and Entresto when able.   -Cardiology consult-[facility]  11/26  -Serial troponins trending down   Acute hypoxic respiratory failure due to acute pulmonary edema -Supplemental oxygen as required to maintain SPO2 greater than 90% - holding diuretics am 11/27 with low bp   Hypervolemic hyponatremia; TSH and cortisol WNL-volume resuscitated at admission -Continue to monitor  Chronic right lower extremity edema -Doppler ultrasound 11/26 neg  Acute metabolic encephalopathy secondary to sepsis--improving Hx of chronic joint pain, restless leg syndrome, diabetic neuropathy -Continue to hold outpt wellbutrin, celebrex, klonopin, duragesic patch, neurontin, percocet  Restless leg syndrome -SCDs -Con't home Requip  NAGMA, likely due to sepsis-stable -Continue to monitor  Hx of COPD; no inhalers PTA -Albuterol every 4 hours as needed -BiPAP as needed  DM type 2, suboptimally controlled with hyperglycemia -Holding OP oral hypoglycemics -Con't SSI PRN -Goal BG 140-180 while admitted to the ICU  Hx of GERD on PPI PTA -protonix IV  Hx of BPH -Con't outpatient  Flomax  If bp tolerates   Anemia, chronic. Likely due to chronic disease   Lab Results  Component Value Date   HGB 7.9 (L) 07/12/2020   HGB 9.1 (L) 07/11/2020   HGB 9.7 (L) 07/10/2020    -Serial CBCs -Transfuse 11/27     Best practice (evaluated daily)   Diet: clears DVT prophylaxis: IV hep  GI prophylaxis: Protonix Mobility: progressive last date of multidisciplinary goals of care discussion:  Family and staff present : son in [place]  Code Status: Full code Disposition: ICU  Labs   CBC: Recent Labs  Lab 07/09/20 2105 07/10/20 0253 07/11/20 0425 07/12/20 0422  WBC 11.6* 10.1 10.4 9.2  NEUTROABS 9.8*  --  8.1*  --   HGB 10.5* 9.7* 9.1* 7.9*  HCT 32.0* 29.5* 27.7* 23.2*  MCV 88.4 90.8 88.5 86.2  PLT 360 311 355 778    Basic Metabolic Panel: Recent Labs  Lab 07/09/20 2105 07/10/20 0253 07/11/20 0425 07/12/20 0422  NA 124* 128* 129* 131*  K 5.1 4.1 4.3 3.7  CL 92* 99 100 98  CO2 21* 20* 19* 24  GLUCOSE 219* 107* 143* 131*  BUN 29* 23 13 17   CREATININE 1.08 0.79 0.60* 0.58*  CALCIUM 9.0 8.2* 8.1* 7.9*  MG  --   --  1.8 1.9   GFR: Estimated Creatinine Clearance: 88.9 mL/min (A) (by C-G formula based on SCr of 0.58 mg/dL (L)). Recent Labs  Lab 07/09/20 2105 07/09/20 2326 07/10/20 0253 07/11/20 0425 07/12/20 0422  WBC 11.6*  --  10.1 10.4 9.2  LATICACIDVEN 1.6 1.2  --   --   --     Liver Function Tests: Recent Labs  Lab 07/09/20 2105 07/10/20 0253 07/11/20 0425  AST 19 15 20   ALT 16 13 13   ALKPHOS 125 106 113  BILITOT 0.8 0.6 1.0  PROT 7.8 6.2* 6.4*  ALBUMIN 3.2* 2.6* 2.6*   No results for input(s): LIPASE, AMYLASE in the last 168 hours. No results for input(s): AMMONIA in the last 168 hours.  ABG    Component Value Date/Time   PHART 7.317 (L) 09/22/2015 1908   PCO2ART 44.4 09/22/2015 1908   PO2ART 81.0 09/22/2015 1908   HCO3 22.8 09/22/2015 1908   TCO2 22 05/07/2020 1444   ACIDBASEDEF 3.0 (H) 09/22/2015 1908   O2SAT 95.0  09/22/2015 1908     Coagulation Profile: Recent Labs  Lab 07/09/20 2105  INR 1.3*    Cardiac Enzymes: No results for input(s): CKTOTAL, CKMB, CKMBINDEX, TROPONINI in the last 168 hours.  HbA1C: Hemoglobin A1C  Date/Time Value Ref Range Status  12/01/2017 12:00 AM 6.6  Final  07/28/2017 12:00 AM 6.5  Final   Hgb A1c MFr Bld  Date/Time Value Ref Range Status  05/12/2020 06:22 PM 7.2 (H) 4.8 - 5.6 % Final    Comment:    (NOTE) Pre diabetes:          5.7%-6.4%  Diabetes:              >6.4%  Glycemic control for   <7.0% adults with diabetes   06/28/2018 03:25 PM 6.5 (H) <5.7 % of total Hgb Final    Comment:    For someone without known diabetes, a hemoglobin A1c value of 6.5% or greater indicates that they may have  diabetes and this should be confirmed with a follow-up  test. . For someone with known diabetes, a value <7% indicates  that their diabetes is well controlled and a value  greater than or equal to 7% indicates suboptimal  control. A1c targets should be individualized based on  duration of diabetes, age, comorbid conditions, and  other considerations. . Currently, no consensus exists regarding use of hemoglobin A1c for diagnosis of diabetes for children. .     CBG: Recent Labs  Lab 07/11/20 1611 07/11/20 2002 07/11/20 2320 07/12/20 0437 07/12/20 0802  GLUCAP 157* 121* 151* 134* 132*      The patient is critically ill with multiple organ systems failure and requires high complexity decision making for assessment and support, frequent evaluation and titration of therapies, application of advanced monitoring technologies and extensive interpretation of multiple databases. Critical Care Time devoted to patient care services described in this note is 45 minutes.    Christinia Gully, MD Pulmonary and [place] [phone]   After 7:00 pm call Elink  [phone]

## 2020-07-12 NOTE — Progress Notes (Signed)
    [facility]   Date of Admission:  07/09/2020     Patient chart reviewed Afebrile last 24 hours, T-max 99.4 Low blood pressures noted requiring norepinephrine No leukocytosis Synovial fluid cultures from left shoulder with rare group B strep Urine cultures with 10,000 colonies of Pseudomonas as well as group B strep Admission blood cultures from 11/24 with group B strep sensitive to penicillin, currently on ceftriaxone  Assessment: 74 year old man with group B strep bacteremia and septic shock secondary to left shoulder joint infection status post debridement 11/25.  Plan: --Transition ceftriaxone to penicillin (of note, allergy listed as nausea to penicillin, however, patient has tolerated dose of Zosyn in the past) --Repeat blood cultures pending --Dr. [name] to follow-up on Monday  Mignon Pine  07/12/2020, 11:22 AM

## 2020-07-12 NOTE — Progress Notes (Signed)
ANTICOAGULATION CONSULT NOTE -  Consult  Pharmacy Consult for IV heparin Indication: chest pain/ACS  Allergies  Allergen Reactions   Ivp Dye [Iodinated Diagnostic Agents]     Blood Pressure dropped----pt was pre-medicated with 13 hour prep and did fine with pre-meds--amy 03/09/11    Penicillins Nausea Only    REACTION: stomach pain(IV ok) Has patient had a PCN reaction causing immediate rash, facial/tongue/throat swelling, SOB or lightheadedness with hypotension: No Has patient had a PCN reaction causing severe rash involving mucus membranes or skin necrosis: No Has patient had a PCN reaction that required hospitalization: No Has patient had a PCN reaction occurring within the last 10 years: No If all of the above answers are "NO", then may proceed with Cephalosporin use.    Sulfa Antibiotics Hives   Sulfonamide Derivatives Hives and Itching   Lisinopril Cough   Metoprolol Nausea And Vomiting   Statins Other (See Comments)    myalgias   Propoxyphene N-Acetaminophen Other (See Comments)    Sharp pains- headache    Patient Measurements: Height: 6' (182.9 cm) Weight: 91 kg (200 lb 9.9 oz) IBW/kg (Calculated) : 77.6 Heparin Dosing Weight:   Vital Signs: Temp: 97.6 F (36.4 C) (11/27 0430) Temp Source: Oral (11/27 0430) BP: 131/72 (11/27 0615) Pulse Rate: 89 (11/27 0615)  Labs: Recent Labs    07/09/20 2105 07/09/20 2105 07/10/20 0253 07/10/20 0253 07/11/20 0425 07/11/20 0640 07/11/20 1636 07/11/20 2056 07/12/20 0422  HGB 10.5*   < > 9.7*   < > 9.1*  --   --   --  7.9*  HCT 32.0*   < > 29.5*  --  27.7*  --   --   --  23.2*  PLT 360   < > 311  --  355  --   --   --  320  APTT 38*  --   --   --   --   --   --   --   --   LABPROT 15.3*  --   --   --   --   --   --   --   --   INR 1.3*  --   --   --   --   --   --   --   --   HEPARINUNFRC  --   --   --   --   --   --  <0.10*  --  <0.10*  CREATININE 1.08   < > 0.79  --  0.60*  --   --   --  0.58*  TROPONINIHS   --   --   --   --  103*   < > 441* 501* 304*   < > = values in this interval not displayed.    Estimated Creatinine Clearance: 88.9 mL/min (A) (by C-G formula based on SCr of 0.58 mg/dL (L)).    Assessment: Pharmacy is consulted to dose heparin in 74 yo male diagnosed with ACS. Pt complaining of CP with SOB and diaphoresis over night. 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 Currently on aspirin 81 mg PO daily and ticagrelor 90 mg PO BID. Not on any blood thinners PTA.    Today, 07/12/20  Hgb 7.9 (decreased from 9.1)   Plt 320   Scr < 1   Heparin level remains undetectable after 2500 unit re-bolus and heparin drip increase to 1550 units/hr.  RN confirms heparin drip is infusing well with no issues and no complications of therapy  Goal of Therapy:  Heparin level 0.3-0.7 units/ml Monitor platelets by anticoagulation protocol: Yes   Plan:   Re bolus with heparin 2500 units  Increase heparin drip to 1800 unit/hr   Obtain HL 8 hours after bolus and rate increase   Daily CBC while on heparin   Monitor for signs and symptoms of bleeding, esp as pt is on DAPT   Leone Haven, PharmD 07/12/2020 6:24 AM

## 2020-07-12 NOTE — Plan of Care (Signed)
Hemondynamically stabilized with levo gtt, troponin trending down, SPO2 100%, adequate urine output, pt refuses pain meds at this time, pt still has not had a BM, pt is weak and has limited mobility related to pain on movement

## 2020-07-12 NOTE — Progress Notes (Signed)
Notified Dr. Melvyn Novas that patient was having increased shortness of breath, new crackles, and more congestion. Also notified of increased HR and increased frequency of PVCs with 3-4 beat runs. BMP sent to verify electrolytes.  PRN breathing treatment and mucinex given. STAT chest x-ray ordered and lasix given per order. Pt breathing better after interventions

## 2020-07-13 ENCOUNTER — Inpatient Hospital Stay (HOSPITAL_COMMUNITY): Payer: Medicare Other

## 2020-07-13 DIAGNOSIS — J9601 Acute respiratory failure with hypoxia: Secondary | ICD-10-CM | POA: Diagnosis not present

## 2020-07-13 DIAGNOSIS — A419 Sepsis, unspecified organism: Secondary | ICD-10-CM | POA: Diagnosis not present

## 2020-07-13 DIAGNOSIS — R6520 Severe sepsis without septic shock: Secondary | ICD-10-CM | POA: Diagnosis not present

## 2020-07-13 LAB — TYPE AND SCREEN
ABO/RH(D): A NEG
Antibody Screen: NEGATIVE
Unit division: 0

## 2020-07-13 LAB — CBC
HCT: 30.8 % — ABNORMAL LOW (ref 39.0–52.0)
Hemoglobin: 10.2 g/dL — ABNORMAL LOW (ref 13.0–17.0)
MCH: 29 pg (ref 26.0–34.0)
MCHC: 33.1 g/dL (ref 30.0–36.0)
MCV: 87.5 fL (ref 80.0–100.0)
Platelets: 413 10*3/uL — ABNORMAL HIGH (ref 150–400)
RBC: 3.52 MIL/uL — ABNORMAL LOW (ref 4.22–5.81)
RDW: 16.7 % — ABNORMAL HIGH (ref 11.5–15.5)
WBC: 11.5 10*3/uL — ABNORMAL HIGH (ref 4.0–10.5)
nRBC: 0 % (ref 0.0–0.2)

## 2020-07-13 LAB — GLUCOSE, CAPILLARY
Glucose-Capillary: 147 mg/dL — ABNORMAL HIGH (ref 70–99)
Glucose-Capillary: 154 mg/dL — ABNORMAL HIGH (ref 70–99)
Glucose-Capillary: 162 mg/dL — ABNORMAL HIGH (ref 70–99)
Glucose-Capillary: 162 mg/dL — ABNORMAL HIGH (ref 70–99)
Glucose-Capillary: 184 mg/dL — ABNORMAL HIGH (ref 70–99)
Glucose-Capillary: 213 mg/dL — ABNORMAL HIGH (ref 70–99)

## 2020-07-13 LAB — BASIC METABOLIC PANEL
Anion gap: 11 (ref 5–15)
BUN: 23 mg/dL (ref 8–23)
CO2: 23 mmol/L (ref 22–32)
Calcium: 8.3 mg/dL — ABNORMAL LOW (ref 8.9–10.3)
Chloride: 97 mmol/L — ABNORMAL LOW (ref 98–111)
Creatinine, Ser: 0.73 mg/dL (ref 0.61–1.24)
GFR, Estimated: >60 mL/min (ref >60)
Glucose, Bld: 175 mg/dL — ABNORMAL HIGH (ref 70–99)
Potassium: 3.6 mmol/L (ref 3.5–5.1)
Sodium: 131 mmol/L — ABNORMAL LOW (ref 135–145)

## 2020-07-13 LAB — BPAM RBC
Blood Product Expiration Date: 202112102359
ISSUE DATE / TIME: 202111271255
Unit Type and Rh: 600

## 2020-07-13 LAB — SYNOVIAL CELL COUNT + DIFF, W/ CRYSTALS
Crystals, Fluid: NONE SEEN
Lymphocytes-Synovial Fld: 1 % (ref 0–20)
Monocyte-Macrophage-Synovial Fluid: 2 % — ABNORMAL LOW (ref 50–90)
Neutrophil, Synovial: 97 % — ABNORMAL HIGH (ref 0–25)
WBC, Synovial: 16900 /mm3 — ABNORMAL HIGH (ref 0–200)

## 2020-07-13 LAB — HEPARIN LEVEL (UNFRACTIONATED)
Heparin Unfractionated: 0.29 IU/mL — ABNORMAL LOW (ref 0.30–0.70)
Heparin Unfractionated: 0.62 IU/mL (ref 0.30–0.70)
Heparin Unfractionated: 0.57 IU/mL (ref 0.30–0.70)

## 2020-07-13 MED ORDER — HEPARIN (PORCINE) 25000 UT/250ML-% IV SOLN
2150.0000 [IU]/h | INTRAVENOUS | Status: DC
  Administered 2020-07-13 – 2020-07-14 (×2): 2150 [IU]/h via INTRAVENOUS
  Filled 2020-07-13 (×2): qty 250

## 2020-07-13 MED ORDER — CARVEDILOL 3.125 MG PO TABS
3.1250 mg | ORAL_TABLET | Freq: Two times a day (BID) | ORAL | Status: DC
  Administered 2020-07-13 – 2020-07-15 (×4): 3.125 mg via ORAL
  Filled 2020-07-13 (×4): qty 1

## 2020-07-13 MED ORDER — PANTOPRAZOLE SODIUM 40 MG PO TBEC
40.0000 mg | DELAYED_RELEASE_TABLET | Freq: Every day | ORAL | Status: DC
  Administered 2020-07-13 – 2020-07-22 (×10): 40 mg via ORAL
  Filled 2020-07-13 (×10): qty 1

## 2020-07-13 MED ORDER — FUROSEMIDE 10 MG/ML IJ SOLN
40.0000 mg | Freq: Once | INTRAMUSCULAR | Status: AC
  Administered 2020-07-13: 40 mg via INTRAVENOUS
  Filled 2020-07-13: qty 4

## 2020-07-13 MED ORDER — ALBUTEROL SULFATE (2.5 MG/3ML) 0.083% IN NEBU: 1.2500 mg | INHALATION_SOLUTION | RESPIRATORY_TRACT | Status: DC | PRN

## 2020-07-13 NOTE — Progress Notes (Signed)
ANTICOAGULATION CONSULT NOTE -  Consult  Pharmacy Consult for IV heparin Indication: chest pain/ACS  Allergies  Allergen Reactions  . Ivp Dye [Iodinated Diagnostic Agents]     Blood Pressure dropped----pt was pre-medicated with 13 hour prep and did fine with pre-meds--amy 03/09/11   . Penicillins Nausea Only    REACTION: stomach pain(IV ok) Has patient had a PCN reaction causing immediate rash, facial/tongue/throat swelling, SOB or lightheadedness with hypotension: No Has patient had a PCN reaction causing severe rash involving mucus membranes or skin necrosis: No Has patient had a PCN reaction that required hospitalization: No Has patient had a PCN reaction occurring within the last 10 years: No If all of the above answers are "NO", then may proceed with Cephalosporin use.   . Sulfa Antibiotics Hives  . Sulfonamide Derivatives Hives and Itching  . Lisinopril Cough  . Metoprolol Nausea And Vomiting  . Statins Other (See Comments)    myalgias  . Propoxyphene N-Acetaminophen Other (See Comments)    Sharp pains- headache    Patient Measurements: Height: 6' (182.9 cm) Weight: 91 kg (200 lb 9.9 oz) IBW/kg (Calculated) : 77.6 Heparin Dosing Weight:   Vital Signs: Temp: 99.7 F (37.6 C) (11/28 0838) Temp Source: Axillary (11/28 0838) BP: 89/68 (11/28 0400) Pulse Rate: 109 (11/28 0900)  Labs: Recent Labs    07/11/20 0425 07/11/20 0640 07/11/20 1636 07/11/20 2056 07/12/20 0422 07/12/20 0422 07/12/20 0940 07/12/20 1614 07/12/20 1704 07/13/20 0123 07/13/20 0603 07/13/20 1041  HGB 9.1*   < >  --   --  7.9*   < >  --   --  9.2*  --  10.2*  --   HCT 27.7*   < >  --   --  23.2*  --   --   --  27.9*  --  30.8*  --   PLT 355  --   --   --  320  --   --   --   --   --  413*  --   HEPARINUNFRC  --    < >   < >  --  <0.10*   < >  --  0.22*  --  0.29*  --  0.62  CREATININE 0.60*   < >  --   --  0.58*  --   --   --  0.78  --  0.73  --   TROPONINIHS 103*   < >  --  501* 304*  --   284*  --   --   --   --   --    < > = values in this interval not displayed.    Estimated Creatinine Clearance: 88.9 mL/min (by C-G formula based on SCr of 0.73 mg/dL).    Assessment: Pharmacy is consulted to dose heparin in 74 yo male diagnosed with ACS. Pt complaining of CP with SOB and diaphoresis over night. 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 Currently on aspirin 81 mg PO daily and ticagrelor 90 mg PO BID. Not on any blood thinners PTA.    Today, 07/13/20  Heparin level = 0.62 therapeutic but increasing with heparin drip at 2150 units/hr.    Hgb low but stable  Plt 413  Scr < 1   RN confirms heparin drip is infusing with no issues and no complications of therapy  Goal of Therapy:  Heparin level 0.3-0.7 units/ml Monitor platelets by anticoagulation protocol:  Yes   Plan:   Continue heparin drip at 2150 unit/hr   Obtain confirmatory HL 8 hours after rate increase   Daily CBC while on heparin   Monitor for signs and symptoms of bleeding   Royetta Asal, PharmD, BCPS 07/13/2020 11:34 AM

## 2020-07-13 NOTE — Progress Notes (Signed)
Pharmacy: Re-heparin  Patient's a 74 y.o M s/p left shoulder lavage and debridement on 11/25 currently on heparin drip for NSTEMI.   - confirmatory heparin level remains therapeutic at 0.57 with drip running at 2150 units/hr  Goal of Therapy:  Heparin level 0.3-0.7 units/ml Monitor platelets by anticoagulation protocol: Yes    Plan: - continue heparin drip at 2150 units/hr - daily heparin level - monitor for s/sx bleeding  Dia Sitter, PharmD, BCPS 07/13/2020 7:22 PM

## 2020-07-13 NOTE — Progress Notes (Signed)
Progress Note  Patient Name: [name] Date of Encounter: 07/13/2020  Attending physician: Tanda Rockers, MD Primary care provider: Patrecia Pour, Christean Grief, MD Primary Cardiologist: Dr. Laurance Flatten at [facility].  Former Cardiologist: Dr. Einar Gip (last seen 2012) Consultant:Noelia Lenart, DO  Subjective: [name] is a 74 y.o. male who was seen and examined at bedside at approximately 1145pm No events overnight Patient denies any chest pain. S/p blood transfusion, right shoulder arthrocentesis by orthopedics this morning, off of pressor support, started on diuretic therapy and Foley placed. Denies orthopnea, paroxysmal nocturnal dyspnea or lower extremity swelling. Case discussed and reviewed with his nurse.  Objective: Vital Signs in the last 24 hours: Temp:  [98.1 F (36.7 C)-100.5 F (38.1 C)] 99.7 F (37.6 C) (11/28 0838) Pulse Rate:  [95-130] 103 (11/28 1130) Resp:  [17-31] 25 (11/28 1130) BP: (89-100)/(65-72) 89/68 (11/28 0400) SpO2:  [95 %-100 %] 100 % (11/28 1130) Arterial Line BP: (84-113)/(48-79) 100/62 (11/28 1130)  Intake/Output:  Intake/Output Summary (Last 24 hours) at 07/13/2020 1215 Last data filed at 07/13/2020 0900 Gross per 24 hour  Intake 1298.18 ml  Output 3020 ml  Net -1721.82 ml    Net IO Since Admission: -1,361.31 mL [07/13/20 1215]  Weights:  Filed Weights   07/09/20 2017 07/10/20 0130 07/12/20 0515  Weight: 86.2 kg 92.5 kg 91 kg    Telemetry: Personally reviewed.  Normal sinus rhythm with rare PVCs and rare NSVT.  Physical examination: PHYSICAL EXAM: Vitals with BMI 07/13/2020 07/13/2020 07/13/2020  Height - - -  Weight - - -  BMI - - -  Systolic - - -  Diastolic - - -  Pulse 409 110 109    CONSTITUTIONAL: Age-appropriate male, hemodynamically stable, mild distress. SKIN: Skin is warm and dry. No rash noted. No cyanosis. No pallor. No jaundice HEAD: Normocephalic and atraumatic.  EYES: No scleral  icterus MOUTH/THROAT: Moist oral membranes.  NECK: No JVD present. No thyromegaly noted.  LYMPHATIC: No visible cervical adenopathy.  CHEST Normal respiratory effort. No intercostal retractions  LUNGS:  Rhonchi is noted bilaterally.  No stridor. No wheezes.   CARDIOVASCULAR: Regular,  positive W1-X9, soft holosystolic murmur heard at the apex, no gallops or rubs. ABDOMINAL: Soft, nontender, nondistended, positive bowel sounds in all 4 quadrants, no apparent ascites. Foley present EXTREMITIES: Trace peripheral edema.  2+ bilateral femoral pulses.  Diminished popliteal and posterior tibial pulses bilaterally.  Warm to touch bilaterally. HEMATOLOGIC: No significant bruising NEUROLOGIC: Oriented to person, place, and time. Nonfocal. Normal muscle tone.  PSYCHIATRIC: Normal mood and affect. Normal behavior. Cooperative  Lab Results: Hematology Recent Labs  Lab 07/11/20 0425 07/11/20 0425 07/12/20 0422 07/12/20 1704 07/13/20 0603  WBC 10.4  --  9.2  --  11.5*  RBC 3.13*  --  2.69*  --  3.52*  HGB 9.1*   < > 7.9* 9.2* 10.2*  HCT 27.7*   < > 23.2* 27.9* 30.8*  MCV 88.5  --  86.2  --  87.5  MCH 29.1  --  29.4  --  29.0  MCHC 32.9  --  34.1  --  33.1  RDW 16.6*  --  16.6*  --  16.7*  PLT 355  --  320  --  413*   < > = values in this interval not displayed.    Chemistry Recent Labs  Lab 07/09/20 2105 07/09/20 2105 07/10/20 0253 07/10/20 0253 07/11/20 0425 07/11/20 0425 07/12/20 0422 07/12/20 1704 07/13/20 0603  NA 124*   < >  128*   < > 129*   < > 131* 130* 131*  K 5.1   < > 4.1   < > 4.3   < > 3.7 3.8 3.6  CL 92*   < > 99   < > 100   < > 98 97* 97*  CO2 21*   < > 20*   < > 19*   < > 24 22 23   GLUCOSE 219*   < > 107*   < > 143*   < > 131* 181* 175*  BUN 29*   < > 23   < > 13   < > 17 22 23   CREATININE 1.08   < > 0.79   < > 0.60*   < > 0.58* 0.78 0.73  CALCIUM 9.0   < > 8.2*   < > 8.1*   < > 7.9* 8.1* 8.3*  PROT 7.8  --  6.2*  --  6.4*  --   --   --   --   ALBUMIN 3.2*  --   2.6*  --  2.6*  --   --   --   --   AST 19  --  15  --  20  --   --   --   --   ALT 16  --  13  --  13  --   --   --   --   ALKPHOS 125  --  106  --  113  --   --   --   --   BILITOT 0.8  --  0.6  --  1.0  --   --   --   --   GFRNONAA >60   < > >60   < > >60   < > >60 >60 >60  ANIONGAP 11   < > 9   < > 10   < > 9 11 11    < > = values in this interval not displayed.     Cardiac Enzymes: Cardiac Panel (last 3 results) Recent Labs    07/11/20 2056 07/12/20 0422 07/12/20 0940  TROPONINIHS 501* 304* 284*    BNP (last 3 results) Recent Labs    11/23/19 0724 05/02/20 1620 07/11/20 0425  BNP 362.7* 626.2* 2,329.1*    ProBNP (last 3 results) No results for input(s): PROBNP in the last 8760 hours.   DDimer No results for input(s): DDIMER in the last 168 hours.   Hemoglobin A1c:  Lab Results  Component Value Date   HGBA1C 7.2 (H) 05/12/2020   MPG 159.94 05/12/2020    TSH  Recent Labs    07/09/20 2356  TSH 0.996    Lipid Panel     Component Value Date/Time   CHOL 139 03/21/2018 0810   TRIG 71 03/21/2018 0810   HDL 37 (L) 03/21/2018 0810   CHOLHDL 3.8 03/21/2018 0810   VLDL 17.0 11/15/2014 1003   LDLCALC 86 03/21/2018 0810   LDLDIRECT 108.2 03/13/2010 1340    Imaging: DG Chest 1 View  Result Date: 07/12/2020 CLINICAL DATA:  Shortness of breath EXAM: CHEST  1 VIEW COMPARISON:  July 11, 2020 FINDINGS: No edema or airspace opacity. Heart is mildly enlarged with pulmonary vascularity normal. Patient is status post coronary artery bypass grafting. No adenopathy. There is arthropathy and postoperative change in each shoulder. Surgical clips noted in lower left neck region. There is aortic atherosclerosis. IMPRESSION: No edema or airspace opacity. Stable cardiomegaly. Status post coronary artery  bypass grafting. Aortic Atherosclerosis (ICD10-I70.0). Electronically Signed   By: Lowella Grip III M.D.   On: 07/12/2020 17:30   DG CHEST PORT 1 VIEW  Result Date:  07/13/2020 CLINICAL DATA:  Respiratory failure with hypoxia. EXAM: PORTABLE CHEST 1 VIEW COMPARISON:  07/12/2020 FINDINGS: Postoperative changes in the mediastinum. Cardiac enlargement with pulmonary vascular congestion. Streaky perihilar infiltrates developing since previous study suggesting edema or possibly multifocal pneumonia. No pleural effusions. No pneumothorax. Mediastinal contours appear intact. Degenerative changes in the spine and shoulders. IMPRESSION: Cardiac enlargement with pulmonary vascular congestion. Developing perihilar infiltrates suggesting edema or multifocal pneumonia. Electronically Signed   By: Lucienne Capers M.D.   On: 07/13/2020 07:01    Cardiac database: EKG: 07/09/2020: Sinus tachycardia, normal axis, consider old inferior infarct, without underlying injury pattern.  07/11/2020 3:50 AM: Sinus tachycardia, 132 bpm, normal axis, consider old inferior infarct, ST depressions in the lateral precordial leads V4-V6 suggestive of ischemia, rare PVCs.  07/11/2020 8:50 AM: Sinus tachycardia, 123 bpm, normal axis, consider old inferior infarct, without underlying injury pattern.  Compared to prior EKG from the same day at 3:50 AM ST depressions have improved.  Echocardiogram: 07/10/2020 1. Technically difficult study with limited views 2. Left ventricular ejection fraction, by estimation, is 50 to 55%. The left ventricle has low normal function. Left ventricular diastolic parameters were normal. 3. Right ventricular systolic function is normal. The right ventricular size is mildly enlarged. 4. Left atrial size was mildly dilated. 5. The mitral valve is normal in structure. Mild mitral valve regurgitation. 6. The aortic valve is tricuspid. Aortic valve regurgitation is not visualized. Mild to moderate aortic valve sclerosis/calcification is present, without any evidence of aortic stenosis.  Scheduled Meds:  aspirin EC  81 mg Oral Daily   carvedilol  3.125 mg Oral BID  WC   Chlorhexidine Gluconate Cloth  6 each Topical Daily   docusate sodium  100 mg Oral BID   insulin aspart  0-15 Units Subcutaneous Q4H   mouth rinse  15 mL Mouth Rinse q12n4p   mouth rinse  15 mL Mouth Rinse BID    morphine injection  2 mg Intravenous Once   pantoprazole  40 mg Oral Daily   rOPINIRole  1 mg Oral TID   ticagrelor  90 mg Oral BID    Continuous Infusions:  sodium chloride Stopped (07/10/20 0748)   heparin 2,150 Units/hr (07/13/20 0900)   penicillin g continuous IV infusion 12 Million Units (07/13/20 0554)    PRN Meds: acetaminophen, albuterol, bisacodyl, guaiFENesin, magnesium citrate, menthol-cetylpyridinium **OR** phenol, metoCLOPramide **OR** metoCLOPramide (REGLAN) injection, morphine injection, ondansetron **OR** ondansetron (ZOFRAN) IV, oxyCODONE, polyethylene glycol   IMPRESSION & RECOMMENDATIONS: Watson Robarge is a 74 y.o. male whose past medical history and cardiac risk factors include: Established CAD with prior CABG in 2017, PAD status post aortobifemoral bypass by Dr. 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  Non-STEMI: Most likely secondary to supply demand ischemia given his underlying CAD in the setting of sepsis, recent surgery, tachycardia, hypervolemia due to sepsis. -Patient reports no chest pain for the last 2 days. -Troponins have started to downtrend, peak troponin at 501 -Nitroglycerin drip held due to low blood pressures and no chest pain.  May use if needed. -Levophed has been held since morning.    -Continue aspirin and restarted on Brilinta. -Started on low-dose carvedilol 3.125  mg p.o. twice daily given his underlying CAD, NSTEMI, and NSVT on telemetry. -Heart failure medications are currently being held due to underlying sepsis.  Recommending  restarting once sepsis resolves. -Started on parenteral diuretics and a Foley placed given the chest x-ray findings suggestive of possible vascular congestion.  Recommend strict I's and O's and daily weights.   -Discussed with the patient that his non-STEMI is most likely sequela of his underlying septic shock (blood cultures positive for strep and urine cultures positive for Pseudomonas.  Synovial fluid pathology pending). -Patient follows up with Dr. Laurance Flatten from [place] and based on his last encounter with him that is available from Care Everywhere patient has establish CAD with stable angina at baseline.  I do not have any angiograms to review but based on the office notes it appears that he has severe native CAD and his surgical revascularization anatomy notes patency of the LIMA to the LAD graft, sequential graft to the diagonal/ramus is patent but jump graft to the obtuse marginal branches occluded, SVG to PDA is occluded as well.  In conclusion, the LAD distribution is being supplied by the LIMA and the SVG to RI/Diagonal branch, RCA is revascularized by collaterals from the LAD, only LCX distribution is not well revascularized. It appears that prior attempts were made to revascularize the LCX distribution but it was unsuccessful.  Therefore the prior EKG findings and coronary anatomy as alluded by Dr. Tawanna Sat the last office note are concordant. -No acute intervention needed at this time as he is now asymptomatic and has known CAD as discussed above in the setting of sepsis leading to supply demand ischemia. -Recommend the patient follows up with Dr. Laurance Flatten upon discharge to continue his longitudinal cardiovascular care. -Continue your care regarding his underlying sepsis and would recommend reinitiation of home cardiac medications as his hemodynamics improved/allow.  Established CAD status post CABG without angina pectoris:  Started on Coreg 3.125 mg p.o. twice  daily. Recommend restarting Entresto and Imdur once his hemodynamics improved and is off of pressor support.  Continue aspirin and Brilinta. Status post blood transfusion 07/12/2020 Recommend restarting home dose of pravastatin 20 mg p.o. nightly.  Secondary Diagnosis: Septic shock due to septic arthritis of the left shoulder and strep bacteremia:  Blood cultures 07/09/2020 reported group B strep Urine culture 07/10/2020 reported Pseudomonas aeruginosa T-max over the last 24 hours 100.5 Patient had his right shoulder drained today as well. Currently managed by CCM and infectious disease teams.  Peripheral artery disease status post aortobifemoral bypass: Educated on importance of risk factor modifications.  Continue aspirin.  Reinitiate pravastatin once cleared by CCM.  Also on PCSK9 inhibitors as outpatient.  History of left carotid endarterectomy:  Continue aspirin. Recommend reinitiation of statins.  Patient also on PCSK9 inhibitors as outpatient.  Hypertension: Currently off vasopressor support but soft BP readings this morning.  Currently managed by CCM.  Hyperlipidemia: Patient is on pravastatin at home, recommend restarting it once cleared by CCM. Also takes PCSK9 inhibitors as at home.  Type 2 diabetes mellitus: Follow sugars.  Per ICU protocol.  Patient's questions and concerns were addressed to his satisfaction. He voices understanding of the instructions provided during this encounter.   This note was created using a voice recognition software as a result there may be grammatical errors inadvertently enclosed that do not reflect the nature of this encounter. Every attempt is made to correct such errors.  Rex Kras, DO, [facility]. [place] Office: [phone] 07/13/2020, 12:15  PM

## 2020-07-13 NOTE — Progress Notes (Addendum)
eLink Physician-Brief Progress Note Patient Name: [name] DOB: 05-May-1946 MRN: [id number]   Date of Service  07/13/2020  HPI/Events of Note  Request for CXR and straight cath for rhonchi w/ hypoxemia and bladder scan > 400cc.   eICU Interventions  CXR and Straight Cath ordered.   ADDENDUM: CXR is unchanged compared to prior.  Intervention Category Intermediate Interventions: Respiratory distress - evaluation and management Minor Interventions: Routine modifications to care plan (e.g. PRN medications for pain, fever)  Marily Lente Shaniyah Wix 07/13/2020, 6:40 AM

## 2020-07-13 NOTE — Progress Notes (Signed)
Pt emesis episode w/ yellow colored emesis with no preceding nausea. Pt mentation altered/decreased, noted increased drowsiness. EKG performed and eLink notified.

## 2020-07-13 NOTE — Progress Notes (Signed)
ANTICOAGULATION CONSULT NOTE -  Consult  Pharmacy Consult for IV heparin Indication: chest pain/ACS  Allergies  Allergen Reactions  . Ivp Dye [Iodinated Diagnostic Agents]     Blood Pressure dropped----pt was pre-medicated with 13 hour prep and did fine with pre-meds--amy 03/09/11   . Penicillins Nausea Only    REACTION: stomach pain(IV ok) Has patient had a PCN reaction causing immediate rash, facial/tongue/throat swelling, SOB or lightheadedness with hypotension: No Has patient had a PCN reaction causing severe rash involving mucus membranes or skin necrosis: No Has patient had a PCN reaction that required hospitalization: No Has patient had a PCN reaction occurring within the last 10 years: No If all of the above answers are "NO", then may proceed with Cephalosporin use.   . Sulfa Antibiotics Hives  . Sulfonamide Derivatives Hives and Itching  . Lisinopril Cough  . Metoprolol Nausea And Vomiting  . Statins Other (See Comments)    myalgias  . Propoxyphene N-Acetaminophen Other (See Comments)    Sharp pains- headache    Patient Measurements: Height: 6' (182.9 cm) Weight: 91 kg (200 lb 9.9 oz) IBW/kg (Calculated) : 77.6 Heparin Dosing Weight:   Vital Signs: Temp: 100 F (37.8 C) (11/27 2357) Temp Source: Axillary (11/27 2357) BP: 97/71 (11/28 0000) Pulse Rate: 123 (11/28 0130)  Labs: Recent Labs    07/10/20 0253 07/10/20 0253 07/11/20 0425 07/11/20 0640 07/11/20 1636 07/11/20 2056 07/12/20 0422 07/12/20 0940 07/12/20 1614 07/12/20 1704 07/13/20 0123  HGB 9.7*   < > 9.1*   < >  --   --  7.9*  --   --  9.2*  --   HCT 29.5*   < > 27.7*  --   --   --  23.2*  --   --  27.9*  --   PLT 311  --  355  --   --   --  320  --   --   --   --   HEPARINUNFRC  --   --   --    < >   < >  --  <0.10*  --  0.22*  --  0.29*  CREATININE 0.79   < > 0.60*  --   --   --  0.58*  --   --  0.78  --   TROPONINIHS  --   --  103*   < >  --  501* 304* 284*  --   --   --    < > = values in  this interval not displayed.    Estimated Creatinine Clearance: 88.9 mL/min (by C-G formula based on SCr of 0.78 mg/dL).    Assessment: Pharmacy is consulted to dose heparin in 74 yo male diagnosed with ACS. Pt complaining of CP with SOB and diaphoresis over night. 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 Currently on aspirin 81 mg PO daily and ticagrelor 90 mg PO BID. Not on any blood thinners PTA.    Today, 07/13/20  Heparin level = 0.29 subtherapeutic but increasing with heparin gtt @ 2000 units/hr.    RN confirms heparin drip is infusing with no issues and no complications of therapy  Goal of Therapy:  Heparin level 0.3-0.7 units/ml Monitor platelets by anticoagulation protocol: Yes   Plan:   Increase heparin drip to 2150 unit/hr   Obtain HL 8 hours after rate increase   Daily CBC while on heparin   Monitor for  signs and symptoms of bleeding  Leone Haven, PharmD 07/13/2020 2:14 AM

## 2020-07-13 NOTE — Progress Notes (Signed)
NAME:  [name], MRN:  [id number], DOB:  01/17/1946, LOS: 4 ADMISSION DATE:  07/09/2020, CONSULTATION DATE: 07/09/2020 REFERRING MD:  Dr. Regenia Skeeter, ER, CHIEF COMPLAINT:  Sepsis   Brief History   74 yo male former smoker with hx of chronic Left shoulder pain requiring arthrocentesis developed weakness, fever 102F, and hypotension (BP 74/57).  Found to have swollen/red left arm.  Had arthrocentesis in ER and started on antibiotics.  PCCM asked to admit to ICU to manage sepsis.  History of present illness   Patient lethargic and not able to provide accurate history.  History from chart and ER staff.  Past Medical History  Vit D deficiency, Varicose veins, RLS, PAD, Peripheral neuropathy, Osteoporosis, CAD with NSTEMI in 2021--  Not fully revascularized, Hyponatremia, HTN, HLD, DM type 2, Diabetic foot, Depression, COPD, Chronic pain, BPH, Blind Lt eye, Anxiety, GERD, BPH  Significant Hospital Events   11/24 Admit  Consults:  [facility]- [facility]   11/26  ID  11/25   Procedures:  I&D left shoulder 11/25  Significant Diagnostic Tests:  Lt shoulder arthrocentesis 11/24 >> brown/turbid, WBC 30,597 (96% neutrophils) >>>  see micro data Transthoracic echo 11/25> LVEF 08-65%, normal diastolic function. Left atrial dilation. Mildly enlarged RV. Mild MR. Ultrasound right lower extremity 11/26 > neg  Micro Data:  COVID/Flu 11/24 >> negative Lt shoulder synovial fluid 11/24 >> strep agalactiae Blood 11/24 >> strep agalactiae sensitive to rocephin BC x 2 11/24 >>> Urine 11/25>>  Pseudomonas x 10k plus 10k strep agalatiae /pansensitive including rocephin MRSA PCR 11/25 >> negative BC x 2  11/26 >>> R shoulder tap 11/28 >>>   Antimicrobials:  Vancomycin 11/24 >> 11/26  Rocephin on 11/24  And again  11/26 only Cefepime 11/25 > 11/26    PCN  G  11/27     Scheduled Meds: . aspirin EC  81 mg Oral Daily  . Chlorhexidine Gluconate Cloth  6 each Topical Daily  .  docusate sodium  100 mg Oral BID  . insulin aspart  0-15 Units Subcutaneous Q4H  . isosorbide mononitrate  60 mg Oral Daily  . mouth rinse  15 mL Mouth Rinse q12n4p  . mouth rinse  15 mL Mouth Rinse BID  .  morphine injection  2 mg Intravenous Once  . pantoprazole (PROTONIX) IV  40 mg Intravenous Q24H  . rOPINIRole  1 mg Oral TID  . ticagrelor  90 mg Oral BID   Continuous Infusions: . sodium chloride Stopped (07/10/20 0748)  . heparin 2,150 Units/hr (07/13/20 0313)  . norepinephrine (LEVOPHED) Adult infusion 4 mcg/min (07/13/20 0000)  . penicillin g continuous IV infusion 12 Million Units (07/13/20 0554)   PRN Meds:.acetaminophen, albuterol, bisacodyl, guaiFENesin, magnesium citrate, menthol-cetylpyridinium **OR** phenol, metoCLOPramide **OR** metoCLOPramide (REGLAN) injection, morphine injection, ondansetron **OR** ondansetron (ZOFRAN) IV, oxyCODONE, polyethylene glycol  Interim history/subjective:  Nauseated this am and vomited, no apparent aspiration Mild increased wob with ST with freq pac's and pvc's p held beta blockers 11/27 but this am off levophed and ready to try low dose BB again   Objective   Blood pressure (!) 89/68, pulse (!) 130, temperature 99.4 F (37.4 C), temperature source Axillary, resp. rate (!) 29, height 6' (1.829 m), weight 91 kg, SpO2 99 %.        Intake/Output Summary (Last 24 hours) at 07/13/2020 0630 Last data filed at 07/13/2020 0443 Gross per 24 hour  Intake 1710.57 ml  Output 2570 ml  Net -859.43 ml   [employer]  07/09/20 2017 07/10/20 0130 07/12/20 0515  Weight: 86.2 kg 92.5 kg 91 kg    Examination: Tmax  100.5  (new peak) Sats  99% on 1lpm NP  Pt alert, more chronically than acutely ill appearing at prese4nt  No jvd Oropharynx clear,  mucosa nl Neck supple Lungs with a few scattered exp > insp rhonchi bilaterally Irreg rhythm  no s3 or or sign murmur - looks like ST with PAC's/PVC's on monitor Trace to 1+ pitting peripheral edema    Abd soft/ limieted excursion  Extr warm with no clubbing noted/ L shoulder dressing clean  Neuro  Sensorium year/month/president/   no apparent motor deficits     I personally reviewed images and   impression as follows:  CXR:   11/28  Portable  Cardiac enlargement with pulmonary vascular congestion. Developing perihilar infiltrates suggesting edema or multifocal pneumonia.       Resolved Hospital Problem list     Assessment & Plan:   Septic shock due to septic arthritis of left shoulder and strep bacteremia - changed to high dose PCN  By ID 11/27  - Appreciate ortho's assistance for source control. -resumed low dose levophed am 11/27 and held coreg/ imdur > off levophed am 11/28 so restart  very low dose coreg to see if tolerates (listed as nausea from metaprolol) and if not could do very low dose IV cardizem to control rate    NSTEMI, likely demand related to sepsis, recent surgery, hypervolemia. History of coronary artery disease that has not been fully revascularized, chronic HFrEF due to ischemic cardiomyopathy, hypertension, hyperlipidemia - Has a statin allergy - Con't aspirin   brilinta 11/26 and adjust hep to low therapeutic since req'd transfusion 11/27  - Morphine 2 mg IV as needed for chest pain -Holding heart failure meds for now given shock >>>  restart Imdur and Entresto when able.   -Cardiology consult-[facility]  11/26  -Serial troponins trending down   Acute hypoxic respiratory failure due to acute pulmonary edema - Supplemental oxygen as required to maintain SPO2 greater than 90% - held diuretics am 11/27 with low bp but given lasix in pm for increased wob and seemed to help with cxr looking wetter at at that time and still wet   am  1128 > repeat  lasix/ place foley to monitor uop closer and because held flomax for low bp so developing high residuals   Hypervolemic hyponatremia; TSH and cortisol WNL-volume resuscitated at admission -Continue to  monitor  Chronic right lower extremity edema -Doppler ultrasound 11/26 neg  Acute metabolic encephalopathy secondary to sepsis--improving Hx of chronic joint pain, restless leg syndrome, diabetic neuropathy -Continue to hold outpt wellbutrin, celebrex, klonopin, duragesic patch, neurontin, percocet - am 11/28 seems more approp cognitively though had rough night per nursing  Restless leg syndrome -Con't home Requip  NAGMA, likely due to sepsis, resolved  -Continue to monitor  Hx of COPD; no inhalers PTA -Albuterol every 4 hours as needed>>>  reduced dose to 1.25 mg  q4 h prn am 11/28 due to tachycardia    DM type 2, suboptimally controlled with hyperglycemia -Holding OP oral hypoglycemics -Con't SSI PRN -Goal BG 140-180 while admitted to the ICU    Hx of GERD on PPI PTA -protonix changed to po 11/28    Hx of BPH -d/c'd flomax pm 11/27 due to low bp  >> placed foley am 11/28 due to high residuals and need to monitor uop off levophed   Anemia, chronic. Likely  due to chronic disease   Lab Results  Component Value Date   HGB 10.2 (L) 07/13/2020   HGB 9.2 (L) 07/12/2020   HGB 7.9 (L) 07/12/2020   -Transfused11/27 pm with approp response in hgb level suggesting no active bleeding     Best practice (evaluated daily)   Diet: clears DVT prophylaxis: IV hep  GI prophylaxis: Protonix Mobility: progressive last date of multidisciplinary goals of care discussion:  Family and staff present : son in [place]  Code Status: Full code Disposition: ICU  Labs   CBC: Recent Labs  Lab 07/09/20 2105 07/09/20 2105 07/10/20 0253 07/11/20 0425 07/12/20 0422 07/12/20 1704 07/13/20 0603  WBC 11.6*  --  10.1 10.4 9.2  --  11.5*  NEUTROABS 9.8*  --   --  8.1*  --   --   --   HGB 10.5*   < > 9.7* 9.1* 7.9* 9.2* 10.2*  HCT 32.0*   < > 29.5* 27.7* 23.2* 27.9* 30.8*  MCV 88.4  --  90.8 88.5 86.2  --  87.5  PLT 360  --  311 355 320  --  413*   < > = values in this interval not  displayed.    Basic Metabolic Panel: Recent Labs  Lab 07/10/20 0253 07/11/20 0425 07/12/20 0422 07/12/20 1704 07/13/20 0603  NA 128* 129* 131* 130* 131*  K 4.1 4.3 3.7 3.8 3.6  CL 99 100 98 97* 97*  CO2 20* 19* 24 22 23   GLUCOSE 107* 143* 131* 181* 175*  BUN 23 13 17 22 23   CREATININE 0.79 0.60* 0.58* 0.78 0.73  CALCIUM 8.2* 8.1* 7.9* 8.1* 8.3*  MG  --  1.8 1.9  --   --    GFR: Estimated Creatinine Clearance: 88.9 mL/min (by C-G formula based on SCr of 0.73 mg/dL). Recent Labs  Lab 07/09/20 2105 07/09/20 2105 07/09/20 2326 07/10/20 0253 07/11/20 0425 07/12/20 0422 07/13/20 0603  WBC 11.6*   < >  --  10.1 10.4 9.2 11.5*  LATICACIDVEN 1.6  --  1.2  --   --   --   --    < > = values in this interval not displayed.    Liver Function Tests: Recent Labs  Lab 07/09/20 2105 07/10/20 0253 07/11/20 0425  AST 19 15 20   ALT 16 13 13   ALKPHOS 125 106 113  BILITOT 0.8 0.6 1.0  PROT 7.8 6.2* 6.4*  ALBUMIN 3.2* 2.6* 2.6*   No results for input(s): LIPASE, AMYLASE in the last 168 hours. No results for input(s): AMMONIA in the last 168 hours.  ABG    Component Value Date/Time   PHART 7.317 (L) 09/22/2015 1908   PCO2ART 44.4 09/22/2015 1908   PO2ART 81.0 09/22/2015 1908   HCO3 22.8 09/22/2015 1908   TCO2 22 05/07/2020 1444   ACIDBASEDEF 3.0 (H) 09/22/2015 1908   O2SAT 95.0 09/22/2015 1908     Coagulation Profile: Recent Labs  Lab 07/09/20 2105  INR 1.3*    Cardiac Enzymes: No results for input(s): CKTOTAL, CKMB, CKMBINDEX, TROPONINI in the last 168 hours.  HbA1C: Hemoglobin A1C  Date/Time Value Ref Range Status  12/01/2017 12:00 AM 6.6  Final  07/28/2017 12:00 AM 6.5  Final   Hgb A1c MFr Bld  Date/Time Value Ref Range Status  05/12/2020 06:22 PM 7.2 (H) 4.8 - 5.6 % Final    Comment:    (NOTE) Pre diabetes:          5.7%-6.4%  Diabetes:              >  6.4%  Glycemic control for   <7.0% adults with diabetes   06/28/2018 03:25 PM 6.5 (H) <5.7 % of  total Hgb Final    Comment:    For someone without known diabetes, a hemoglobin A1c value of 6.5% or greater indicates that they may have  diabetes and this should be confirmed with a follow-up  test. . For someone with known diabetes, a value <7% indicates  that their diabetes is well controlled and a value  greater than or equal to 7% indicates suboptimal  control. A1c targets should be individualized based on  duration of diabetes, age, comorbid conditions, and  other considerations. . Currently, no consensus exists regarding use of hemoglobin A1c for diagnosis of diabetes for children. .     CBG: Recent Labs  Lab 07/12/20 1219 07/12/20 1614 07/12/20 2048 07/13/20 0017 07/13/20 0416  GLUCAP 156* 179* 163* 147* 184*     The patient is critically ill with multiple organ systems failure and requires high complexity decision making for assessment and support, frequent evaluation and titration of therapies, application of advanced monitoring technologies and extensive interpretation of multiple databases. Critical Care Time devoted to patient care services described in this note is 50  minutes.    Christinia Gully, MD Pulmonary and [place] [phone]   After 7:00 pm call Elink  [phone]

## 2020-07-13 NOTE — Progress Notes (Signed)
NAME:  [name], MRN:  [id number], DOB:  1946/05/20, LOS: 4 ADMISSION DATE:  07/09/2020, CONSULTATION DATE: 07/09/2020 REFERRING MD:  Dr. Regenia Skeeter, ER, CHIEF COMPLAINT:  Sepsis   Brief History   74 yo male former smoker with hx of chronic Left shoulder pain requiring arthrocentesis developed weakness, fever 102F, and hypotension (BP 74/57).  Found to have swollen/red left arm.  Had arthrocentesis in ER and started on antibiotics.  PCCM asked to admit to ICU to manage sepsis.  History of present illness   Patient lethargic and not able to provide accurate history.  History from chart and ER staff.  Past Medical History  Vit D deficiency, Varicose veins, RLS, PAD, Peripheral neuropathy, Osteoporosis, CAD with NSTEMI in 2021--  Not fully revascularized, Hyponatremia, HTN, HLD, DM type 2, Diabetic foot, Depression, COPD, Chronic pain, BPH, Blind Lt eye, Anxiety, GERD, BPH  Significant Hospital Events   11/24 Admit  Consults:  [facility]   11/26  ID  11/25   Procedures:  I&D left shoulder 11/25  Significant Diagnostic Tests:  Lt shoulder arthrocentesis 11/24 >> brown/turbid, WBC 30,597 (96% neutrophils) >>>  see micro data Transthoracic echo 11/25> LVEF 01-00%, normal diastolic function. Left atrial dilation. Mildly enlarged RV. Mild MR. Ultrasound right lower extremity 11/26 > neg  Micro Data:  COVID/Flu 11/24 >> negative Lt shoulder synovial fluid 11/24 >> strep agalactiae Blood 11/24 >> strep agalactiae sensitive to rocephin BC x 2 11/24 >>> Urine 11/25>>  Pseudomonas x 10k plus 10k strep agalatiae /pansensitive including rocephin MRSA PCR 11/25 >> negative BC x 2  11/26 >>>  Antimicrobials:  Vancomycin 11/24 >> 11/26  Rocephin on 11/24  And again  11/26 only Cefepime 11/25 > 11/26    PCN  G  11/27     Scheduled Meds: . aspirin EC  81 mg Oral Daily  . Chlorhexidine Gluconate Cloth  6 each Topical Daily  . docusate sodium  100 mg Oral  BID  . insulin aspart  0-15 Units Subcutaneous Q4H  . isosorbide mononitrate  60 mg Oral Daily  . mouth rinse  15 mL Mouth Rinse q12n4p  . mouth rinse  15 mL Mouth Rinse BID  .  morphine injection  2 mg Intravenous Once  . pantoprazole (PROTONIX) IV  40 mg Intravenous Q24H  . rOPINIRole  1 mg Oral TID  . ticagrelor  90 mg Oral BID   Continuous Infusions: . sodium chloride Stopped (07/10/20 0748)  . heparin 2,150 Units/hr (07/13/20 0313)  . norepinephrine (LEVOPHED) Adult infusion 4 mcg/min (07/13/20 0000)  . penicillin g continuous IV infusion 12 Million Units (07/13/20 0554)   PRN Meds:.acetaminophen, albuterol, bisacodyl, guaiFENesin, magnesium citrate, menthol-cetylpyridinium **OR** phenol, metoCLOPramide **OR** metoCLOPramide (REGLAN) injection, morphine injection, ondansetron **OR** ondansetron (ZOFRAN) IV, oxyCODONE, polyethylene glycol  Interim history/subjective:  Nauseated this am and vomited, no apparent aspiration Mild increased wob with ST with freq pac's and pvc's p held beta blockers and restarted levophed but back off levophed this am   Objective   Blood pressure (!) 89/68, pulse (!) 130, temperature 99.4 F (37.4 C), temperature source Axillary, resp. rate (!) 29, height 6' (1.829 m), weight 91 kg, SpO2 99 %.        Intake/Output Summary (Last 24 hours) at 07/13/2020 0630 Last data filed at 07/13/2020 0443 Gross per 24 hour  Intake 1710.57 ml  Output 2570 ml  Net -859.43 ml   Filed Weights   07/09/20 2017 07/10/20 0130 07/12/20 0515  Weight: 86.2  kg 92.5 kg 91 kg    Examination: Tmax  100.5  (new peak) Sats  99% on 1lpm NP  Pt alert, more chronically than acutely ill appearing at prese4nt  No jvd Oropharynx clear,  mucosa nl Neck supple Lungs with a few scattered exp > insp rhonchi bilaterally RRR no s3 or or sign murmur/ trace to 1+ pitting peripheral edema  Abd soft/ limieted excursion  Extr warm with no clubbing noted/ L shoulder dressing clean    Neuro  Sensorium year/month/president/   no apparent motor deficits     I personally reviewed images and   impression as follows:  CXR:   11/28  Portable  Cardiac enlargement with pulmonary vascular congestion. Developing perihilar infiltrates suggesting edema or multifocal pneumonia.       Resolved Hospital Problem list     Assessment & Plan:   Septic shock due to septic arthritis of left shoulder and strep bacteremia - changed to high dose PCN  By ID 11/27  -Appreciate ortho's assistance for source control. -resumed low dose levophed am 11/27 and held coreg/ imdur > off levophed am 11/28 so restarted very low dose lopressor to see if tolerates     NSTEMI, likely demand related to sepsis, recent surgery, hypervolemia. History of coronary artery disease that has not been fully revascularized, chronic HFrEF due to ischemic cardiomyopathy, hypertension, hyperlipidemia - Has a statin allergy - Con't aspirin   brilinta 11/26 and adjust hep to low therapeutic since req'd transfusion 11/27  - Morphine 2 mg IV as needed for chest pain -Holding heart failure meds for now given shock >>>  restart Imdur and Entresto when able.   -Cardiology consult-[facility]  11/26  -Serial troponins trending down   Acute hypoxic respiratory failure due to acute pulmonary edema - Supplemental oxygen as required to maintain SPO2 greater than 90% - held diuretics am 11/27 with low bp but given lasix in pm for increased wob and seemed to help with cxr looking wetter at at that time and still wet this am > repeat  lasix/ place foley to monior uop closer and because held flomax for low bp so developing high residuals   Hypervolemic hyponatremia; TSH and cortisol WNL-volume resuscitated at admission -Continue to monitor  Chronic right lower extremity edema -Doppler ultrasound 11/26 neg  Acute metabolic encephalopathy secondary to sepsis--improving Hx of chronic joint pain, restless leg  syndrome, diabetic neuropathy -Continue to hold outpt wellbutrin, celebrex, klonopin, duragesic patch, neurontin, percocet  Restless leg syndrome -SCDs -Con't home Requip  NAGMA, likely due to sepsis, resolved  -Continue to monitor  Hx of COPD; no inhalers PTA -Albuterol every 4 hours as needed -BiPAP as needed  DM type 2, suboptimally controlled with hyperglycemia -Holding OP oral hypoglycemics -Con't SSI PRN -Goal BG 140-180 while admitted to the ICU    Hx of GERD on PPI PTA -protonix IV  Hx of BPH -d/c'd flomax pm 11/27 due to low bp  >> watch for urinary retention, foley for now if needed     Anemia, chronic. Likely due to chronic disease   Lab Results  Component Value Date   HGB 10.2 (L) 07/13/2020   HGB 9.2 (L) 07/12/2020   HGB 7.9 (L) 07/12/2020    -Transfused11/27 pm with approp response in hgb level suggesting no active bleeding     Best practice (evaluated daily)   Diet: clears DVT prophylaxis: IV hep  GI prophylaxis: Protonix Mobility: progressive last date of multidisciplinary goals of care discussion:  Family and staff present : son in [place]  Code Status: Full code Disposition: ICU  Labs   CBC: Recent Labs  Lab 07/09/20 2105 07/09/20 2105 07/10/20 0253 07/11/20 0425 07/12/20 0422 07/12/20 1704 07/13/20 0603  WBC 11.6*  --  10.1 10.4 9.2  --  11.5*  NEUTROABS 9.8*  --   --  8.1*  --   --   --   HGB 10.5*   < > 9.7* 9.1* 7.9* 9.2* 10.2*  HCT 32.0*   < > 29.5* 27.7* 23.2* 27.9* 30.8*  MCV 88.4  --  90.8 88.5 86.2  --  87.5  PLT 360  --  311 355 320  --  413*   < > = values in this interval not displayed.    Basic Metabolic Panel: Recent Labs  Lab 07/10/20 0253 07/11/20 0425 07/12/20 0422 07/12/20 1704 07/13/20 0603  NA 128* 129* 131* 130* 131*  K 4.1 4.3 3.7 3.8 3.6  CL 99 100 98 97* 97*  CO2 20* 19* 24 22 23   GLUCOSE 107* 143* 131* 181* 175*  BUN 23 13 17 22 23   CREATININE 0.79 0.60* 0.58* 0.78 0.73  CALCIUM 8.2* 8.1*  7.9* 8.1* 8.3*  MG  --  1.8 1.9  --   --    GFR: Estimated Creatinine Clearance: 88.9 mL/min (by C-G formula based on SCr of 0.73 mg/dL). Recent Labs  Lab 07/09/20 2105 07/09/20 2105 07/09/20 2326 07/10/20 0253 07/11/20 0425 07/12/20 0422 07/13/20 0603  WBC 11.6*   < >  --  10.1 10.4 9.2 11.5*  LATICACIDVEN 1.6  --  1.2  --   --   --   --    < > = values in this interval not displayed.    Liver Function Tests: Recent Labs  Lab 07/09/20 2105 07/10/20 0253 07/11/20 0425  AST 19 15 20   ALT 16 13 13   ALKPHOS 125 106 113  BILITOT 0.8 0.6 1.0  PROT 7.8 6.2* 6.4*  ALBUMIN 3.2* 2.6* 2.6*   No results for input(s): LIPASE, AMYLASE in the last 168 hours. No results for input(s): AMMONIA in the last 168 hours.  ABG    Component Value Date/Time   PHART 7.317 (L) 09/22/2015 1908   PCO2ART 44.4 09/22/2015 1908   PO2ART 81.0 09/22/2015 1908   HCO3 22.8 09/22/2015 1908   TCO2 22 05/07/2020 1444   ACIDBASEDEF 3.0 (H) 09/22/2015 1908   O2SAT 95.0 09/22/2015 1908     Coagulation Profile: Recent Labs  Lab 07/09/20 2105  INR 1.3*    Cardiac Enzymes: No results for input(s): CKTOTAL, CKMB, CKMBINDEX, TROPONINI in the last 168 hours.  HbA1C: Hemoglobin A1C  Date/Time Value Ref Range Status  12/01/2017 12:00 AM 6.6  Final  07/28/2017 12:00 AM 6.5  Final   Hgb A1c MFr Bld  Date/Time Value Ref Range Status  05/12/2020 06:22 PM 7.2 (H) 4.8 - 5.6 % Final    Comment:    (NOTE) Pre diabetes:          5.7%-6.4%  Diabetes:              >6.4%  Glycemic control for   <7.0% adults with diabetes   06/28/2018 03:25 PM 6.5 (H) <5.7 % of total Hgb Final    Comment:    For someone without known diabetes, a hemoglobin A1c value of 6.5% or greater indicates that they may have  diabetes and this should be confirmed with a follow-up  test. . For someone  with known diabetes, a value <7% indicates  that their diabetes is well controlled and a value  greater than or equal to 7%  indicates suboptimal  control. A1c targets should be individualized based on  duration of diabetes, age, comorbid conditions, and  other considerations. . Currently, no consensus exists regarding use of hemoglobin A1c for diagnosis of diabetes for children. .     CBG: Recent Labs  Lab 07/12/20 1219 07/12/20 1614 07/12/20 2048 07/13/20 0017 07/13/20 0416  GLUCAP 156* 179* 163* 147* 184*     The patient is critically ill with multiple organ systems failure and requires high complexity decision making for assessment and support, frequent evaluation and titration of therapies, application of advanced monitoring technologies and extensive interpretation of multiple databases. Critical Care Time devoted to patient care services described in this note is 45 minutes.    Christinia Gully, MD [facility] [place] [phone]   After 7:00 pm call Elink  [phone]

## 2020-07-13 NOTE — Progress Notes (Signed)
Subjective: 3 Days Post-Op Procedure(s) (LRB): SHOULDER ARTHROSCOPY WITH WASHOUT (Left) Patient reports pain as 3 on 0-10 scale.   Denies CP or SOB.  Voiding without difficulty. Positive flatus. Reports right shoulder pain as well for a few weeks. Objective: Vital signs in last 24 hours: Temp:  [98.1 F (36.7 C)-100.5 F (38.1 C)] 99.7 F (37.6 C) (11/28 0838) Pulse Rate:  [63-130] 114 (11/28 0700) Resp:  [17-31] 25 (11/28 0700) BP: (82-100)/(49-72) 89/68 (11/28 0400) SpO2:  [95 %-100 %] 96 % (11/28 0700) Arterial Line BP: (76-113)/(42-79) 102/67 (11/28 0800)  Intake/Output from previous day: 11/27 0701 - 11/28 0700 In: 6761 [P.O.:720; I.V.:555; Blood:330] Out: 2520 [Urine:2450; Drains:70] Intake/Output this shift: Total I/O In: -  Out: 500 [Urine:500]  Recent Labs    07/11/20 0425 07/12/20 0422 07/12/20 1704 07/13/20 0603  HGB 9.1* 7.9* 9.2* 10.2*   Recent Labs    07/12/20 0422 07/12/20 0422 07/12/20 1704 07/13/20 0603  WBC 9.2  --   --  11.5*  RBC 2.69*  --   --  3.52*  HCT 23.2*   < > 27.9* 30.8*  PLT 320  --   --  413*   < > = values in this interval not displayed.   Recent Labs    07/12/20 1704 07/13/20 0603  NA 130* 131*  K 3.8 3.6  CL 97* 97*  CO2 22 23  BUN 22 23  CREATININE 0.78 0.73  GLUCOSE 181* 175*  CALCIUM 8.1* 8.3*   No results for input(s): LABPT, INR in the last 72 hours.  Neurologically intact Dorsiflexion/Plantar flexion intact Incision: moderate drainage dried. Right shoulder with moderate subacromial effusion.  No erythema  Assessment/Plan: 3 Days Post-Op Procedure(s) (LRB): SHOULDER ARTHROSCOPY WITH WASHOUT (Left) Change of the left shoulder dressing with retention of drains.  Surgical incisions healing well. Right shoulder procedure note: I sterilely prepped the right shoulder and under strict sterile conditions aspirated 30 cc of a bloody turbid fluid.  Continue antibiotics and supportive care.  Right shoulder fluid for  cell count Gram stain aerobic anaerobic cultures. Continue utilization of drains and left shoulder.  Consider placement of drain in right shoulder if culture is positive. Discussed with patient.  And with Dr. Lavonna Monarch C Curlie Sittner 07/13/2020, 8:56 AM

## 2020-07-14 ENCOUNTER — Inpatient Hospital Stay: Payer: Self-pay

## 2020-07-14 DIAGNOSIS — E119 Type 2 diabetes mellitus without complications: Secondary | ICD-10-CM | POA: Diagnosis not present

## 2020-07-14 DIAGNOSIS — I739 Peripheral vascular disease, unspecified: Secondary | ICD-10-CM | POA: Diagnosis not present

## 2020-07-14 DIAGNOSIS — M009 Pyogenic arthritis, unspecified: Secondary | ICD-10-CM

## 2020-07-14 DIAGNOSIS — Z9889 Other specified postprocedural states: Secondary | ICD-10-CM

## 2020-07-14 DIAGNOSIS — Z951 Presence of aortocoronary bypass graft: Secondary | ICD-10-CM

## 2020-07-14 DIAGNOSIS — J9601 Acute respiratory failure with hypoxia: Secondary | ICD-10-CM | POA: Diagnosis not present

## 2020-07-14 DIAGNOSIS — E871 Hypo-osmolality and hyponatremia: Secondary | ICD-10-CM

## 2020-07-14 DIAGNOSIS — M00212 Other streptococcal arthritis, left shoulder: Secondary | ICD-10-CM

## 2020-07-14 DIAGNOSIS — R7881 Bacteremia: Secondary | ICD-10-CM

## 2020-07-14 DIAGNOSIS — I959 Hypotension, unspecified: Secondary | ICD-10-CM

## 2020-07-14 DIAGNOSIS — R6521 Severe sepsis with septic shock: Secondary | ICD-10-CM | POA: Diagnosis not present

## 2020-07-14 DIAGNOSIS — M25511 Pain in right shoulder: Secondary | ICD-10-CM

## 2020-07-14 DIAGNOSIS — J9602 Acute respiratory failure with hypercapnia: Secondary | ICD-10-CM

## 2020-07-14 DIAGNOSIS — A419 Sepsis, unspecified organism: Secondary | ICD-10-CM | POA: Diagnosis not present

## 2020-07-14 LAB — BASIC METABOLIC PANEL
Anion gap: 10 (ref 5–15)
BUN: 22 mg/dL (ref 8–23)
CO2: 28 mmol/L (ref 22–32)
Calcium: 8.6 mg/dL — ABNORMAL LOW (ref 8.9–10.3)
Chloride: 96 mmol/L — ABNORMAL LOW (ref 98–111)
Creatinine, Ser: 0.66 mg/dL (ref 0.61–1.24)
GFR, Estimated: >60 mL/min (ref >60)
Glucose, Bld: 165 mg/dL — ABNORMAL HIGH (ref 70–99)
Potassium: 3.6 mmol/L (ref 3.5–5.1)
Sodium: 134 mmol/L — ABNORMAL LOW (ref 135–145)

## 2020-07-14 LAB — CBC
HCT: 29.1 % — ABNORMAL LOW (ref 39.0–52.0)
Hemoglobin: 9.8 g/dL — ABNORMAL LOW (ref 13.0–17.0)
MCH: 29.2 pg (ref 26.0–34.0)
MCHC: 33.7 g/dL (ref 30.0–36.0)
MCV: 86.6 fL (ref 80.0–100.0)
Platelets: 403 10*3/uL — ABNORMAL HIGH (ref 150–400)
RBC: 3.36 MIL/uL — ABNORMAL LOW (ref 4.22–5.81)
RDW: 16.5 % — ABNORMAL HIGH (ref 11.5–15.5)
WBC: 9.4 10*3/uL (ref 4.0–10.5)
nRBC: 0 % (ref 0.0–0.2)

## 2020-07-14 LAB — GLUCOSE, CAPILLARY
Glucose-Capillary: 125 mg/dL — ABNORMAL HIGH (ref 70–99)
Glucose-Capillary: 151 mg/dL — ABNORMAL HIGH (ref 70–99)
Glucose-Capillary: 165 mg/dL — ABNORMAL HIGH (ref 70–99)
Glucose-Capillary: 165 mg/dL — ABNORMAL HIGH (ref 70–99)
Glucose-Capillary: 168 mg/dL — ABNORMAL HIGH (ref 70–99)
Glucose-Capillary: 168 mg/dL — ABNORMAL HIGH (ref 70–99)
Glucose-Capillary: 190 mg/dL — ABNORMAL HIGH (ref 70–99)

## 2020-07-14 LAB — AST: AST: 67 U/L — ABNORMAL HIGH (ref 15–41)

## 2020-07-14 LAB — ALT: ALT: 34 U/L (ref 0–44)

## 2020-07-14 LAB — HEPARIN LEVEL (UNFRACTIONATED): Heparin Unfractionated: 0.55 IU/mL (ref 0.30–0.70)

## 2020-07-14 MED ORDER — ACETAMINOPHEN 325 MG PO TABS
650.0000 mg | ORAL_TABLET | ORAL | Status: DC | PRN
  Administered 2020-07-14 – 2020-07-22 (×6): 650 mg via ORAL
  Filled 2020-07-14 (×6): qty 2

## 2020-07-14 MED ORDER — ENOXAPARIN SODIUM 40 MG/0.4ML ~~LOC~~ SOLN: 40.0000 mg | SUBCUTANEOUS | Status: DC

## 2020-07-14 MED ORDER — ENOXAPARIN SODIUM 40 MG/0.4ML ~~LOC~~ SOLN
40.0000 mg | SUBCUTANEOUS | Status: DC
  Administered 2020-07-14 – 2020-07-21 (×8): 40 mg via SUBCUTANEOUS
  Filled 2020-07-14 (×8): qty 0.4

## 2020-07-14 MED ORDER — TAMSULOSIN HCL 0.4 MG PO CAPS
0.4000 mg | ORAL_CAPSULE | Freq: Every day | ORAL | Status: DC
  Administered 2020-07-14 – 2020-07-22 (×9): 0.4 mg via ORAL
  Filled 2020-07-14 (×9): qty 1

## 2020-07-14 NOTE — Progress Notes (Signed)
PHARMACY CONSULT NOTE FOR:  OUTPATIENT  PARENTERAL ANTIBIOTIC THERAPY (OPAT)  Indication: Septic arthritis of L shoulder Regimen: Penicillin G 24 million units IV q24h End date: 08/21/2020  IV antibiotic discharge orders are pended. To discharging provider:  please sign these orders via discharge navigator,  Select New Orders & click on the button choice - Manage This Unsigned Work.     Thank you for allowing pharmacy to be a part of this patient's care.  [name] 07/14/2020, 4:07 PM

## 2020-07-14 NOTE — Evaluation (Signed)
Physical Therapy Evaluation Patient Details Name: [name] MRN: [id number] DOB: 06/01/1946 Today's Date: 07/14/2020   History of Present Illness  74 year old male presents 07/10/20  from home with generalized weakness, fever, found down by friend. Patient has  chronic left shoulder problems requiring fluid drained off, by orthopedics,most recently 1 to 2 weeks ago.  S/P  arthroscopy and washout of Left shoulder. 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  Clinical Impression  Patient demonstrates nearly no ability to move the RUE with control. Reports pain with Attempts to forward flex the UE. Weak with elbow flexion. Patient is able to  Feed self roughly with left UE from elbow/hand function, does not flex the shoulder. This with his plate in his lap and seated upright. Patient  Does have  At least 4/5 strength of the legs. Unable yo assume sitting  Due to poor UE function and requiring extensive assist. Will attempt  Sitting  on bed edge next visit. Pt admitted with above diagnosis.  Pt currently with functional limitations due to the deficits listed below (see PT Problem List). Pt will benefit from skilled PT to increase their independence and safety with mobility to allow discharge to the venue listed below.      Follow Up Recommendations SNF    Equipment Recommendations  None recommended by PT    Recommendations for Other Services OT consult     Precautions / Restrictions Precautions Precautions: Fall Precaution Comments: painful both shoulders, reports RCT ion R, Left shoulder with dressing.      Mobility  Bed Mobility Overal bed mobility: Needs Assistance Bed Mobility: Supine to Sit     Supine to sit: Mod assist      General bed mobility comments: assisting  to reposition in bed, unable to use either UE functioanally to assit in bed mobility.    Transfers                 General transfer comment: TBA  Ambulation/Gait                Stairs            Wheelchair Mobility    Modified Rankin (Stroke Patients Only)       Balance Overall balance assessment: Needs assistance     Sitting balance - Comments: while in bed upright, listing to the left                                     Pertinent Vitals/Pain Pain Assessment: Faces Faces Pain Scale: Hurts whole lot Pain Location: right shoulder Pain Descriptors / Indicators: Grimacing;Guarding Pain Intervention(s): Monitored during session;Limited activity within patient's tolerance    Home Living Family/patient expects to be discharged to:: Private residence Living Arrangements: Alone Available Help at Discharge: Available PRN/intermittently Type of Home: House Home Access: Stairs to enter Entrance Stairs-Rails: Chemical engineer of Steps: 2 at front and garage Home Layout: One level Home Equipment: Environmental consultant - 2 wheels;Wheelchair - manual;Cane - single point      Prior Function Level of Independence: Independent with assistive device(s)         Comments: Mobility at times with wheelchair in the home.     Hand Dominance  Dominant Hand: Right    Extremity/Trunk Assessment   Upper Extremity Assessment Upper Extremity Assessment: Defer to OT evaluation;RUE deficits/detail RUE Deficits / Details: significant inability to use    Lower Extremity Assessment Lower Extremity Assessment: Generalized weakness       Communication   Communication: No difficulties  Cognition Arousal/Alertness: Awake/alert Behavior During Therapy: WFL for tasks assessed/performed Overall Cognitive Status: Impaired/Different from baseline Area of Impairment: Orientation;Memory                  Orientation Level: Time Current Attention Level: Sustained           General Comments: oriented to monday, 11/30. Does not recall date he arrived to hospital. some information about events leading to coming  to hospital are confused      General Comments      Exercises     Assessment/Plan    PT Assessment Patient needs continued PT services  PT Problem List Decreased strength       PT Treatment Interventions Gait training;Balance training;Functional mobility training;Cognitive remediation;Therapeutic activities;Therapeutic exercise;Patient/family education    PT Goals (Current goals can be found in the Care Plan section)  Acute Rehab PT Goals Patient Stated Goal: agreed to trying to move PT Goal Formulation: With patient Time For Goal Achievement: 07/28/20 Potential to Achieve Goals: Fair    Frequency Min 2X/week   Barriers to discharge Decreased caregiver support      Co-evaluation               AM-PAC PT "6 Clicks" Mobility  Outcome Measure Help needed turning from your back to your side while in a flat bed without using bedrails?: A Lot Help needed moving from lying on your back to sitting on the side of a flat bed without using bedrails?: A Lot Help needed moving to and from a bed to a chair (including a wheelchair)?: Total Help needed standing up from a chair using your arms (e.g., wheelchair or bedside chair)?: Total Help needed to walk in hospital room?: Total Help needed climbing 3-5 steps with a railing? : Total 6 Click Score: 8    End of Session   Activity Tolerance: Patient limited by fatigue;Patient tolerated treatment well Patient left: in bed;with nursing/sitter in room Nurse Communication: Mobility status      Time: 0981-1914 PT Time Calculation (min) (ACUTE ONLY): 29 min   Charges:   PT Evaluation $PT Eval Low Complexity: 1 Low PT Treatments $Self Care/Home Management: [place] Pager [phone] Office [phone]   Claretha Cooper 07/14/2020, 11:05 AM

## 2020-07-14 NOTE — Progress Notes (Signed)
Progress Note  Patient Name: [name] Date of Encounter: 07/14/2020  Attending physician: Spero Geralds, MD Primary care provider: Patrecia Pour, Christean Grief, MD Primary Cardiologist:  Dr. Laurance Flatten at [facility]. Former Cardiologist: Dr. Einar Gip (last seen 2012) Consultant:Ervin Rothbauer Terri Skains, DO  Subjective: [name] is a 74 y.o. male who was seen and examined at bedside at approximately 855am Patient denies any chest pain.  Has some shortness of breath and leg swelling but improving.   Objective: Vital Signs in the last 24 hours: Temp:  [98.1 F (36.7 C)-98.8 F (37.1 C)] 98.8 F (37.1 C) (11/29 0800) Pulse Rate:  [96-109] 98 (11/29 0800) Resp:  [16-25] 16 (11/29 0800) BP: (89-93)/(63-75) 89/63 (11/28 2000) SpO2:  [100 %] 100 % (11/29 0800) Arterial Line BP: (82-102)/(52-65) 85/52 (11/29 0800)  Intake/Output:  Intake/Output Summary (Last 24 hours) at 07/14/2020 1003 Last data filed at 07/14/2020 0900 Gross per 24 hour  Intake 1837.69 ml  Output 2000 ml  Net -162.31 ml    Net IO Since Admission: -1,523.62 mL [07/14/20 1003]  Weights:  Filed Weights   07/09/20 2017 07/10/20 0130 07/12/20 0515  Weight: 86.2 kg 92.5 kg 91 kg    Telemetry: Personally reviewed.  Physical examination: PHYSICAL EXAM: Vitals with BMI 07/14/2020 07/14/2020 07/14/2020  Height - - -  Weight - - -  BMI - - -  Systolic - - -  Diastolic - - -  Pulse 98 361 99  Blood pressure (!) 89/63, pulse 98, temperature 98.8 F (37.1 C), temperature source Axillary, resp. rate 16, height 6' (1.829 m), weight 91 kg, SpO2 100 %.  CONSTITUTIONAL:Age-appropriate male, hemodynamically stable, mild distress. SKIN: Skin is warm and dry. No rash noted. No cyanosis. No pallor. No jaundice HEAD: Normocephalic and atraumatic.  EYES: No scleral icterus MOUTH/THROAT: Moist oral membranes.  NECK: No JVD present. No thyromegaly noted.  LYMPHATIC: No visible cervical adenopathy.  CHEST  Normal respiratory effort. No intercostal retractions  LUNGS: Rhonchi is noted bilaterally.  No stridor. No wheezes.   CARDIOVASCULAR:Regular,  positive W4-R1, soft holosystolic murmur heard at the apex, no gallops or rubs. ABDOMINAL:Soft, nontender, nondistended, positive bowel sounds in all 4 quadrants, no apparent ascites. Foley present EXTREMITIES: Trace peripheral edema.2+ bilateral femoral pulses. Diminished popliteal and posterior tibial pulses bilaterally. Warm to touch bilaterally. HEMATOLOGIC: No significant bruising NEUROLOGIC: Oriented to person, place, and time. Nonfocal. Normal muscle tone.  PSYCHIATRIC: Normal mood and affect. Normal behavior. Cooperative  Lab Results: Hematology Recent Labs  Lab 07/12/20 0422 07/12/20 0422 07/12/20 1704 07/13/20 0603 07/14/20 0420  WBC 9.2  --   --  11.5* 9.4  RBC 2.69*  --   --  3.52* 3.36*  HGB 7.9*   < > 9.2* 10.2* 9.8*  HCT 23.2*   < > 27.9* 30.8* 29.1*  MCV 86.2  --   --  87.5 86.6  MCH 29.4  --   --  29.0 29.2  MCHC 34.1  --   --  33.1 33.7  RDW 16.6*  --   --  16.7* 16.5*  PLT 320  --   --  413* 403*   < > = values in this interval not displayed.    Chemistry Recent Labs  Lab 07/09/20 2105 07/09/20 2105 07/10/20 0253 07/10/20 0253 07/11/20 0425 07/12/20 0422 07/12/20 1704 07/13/20 0603 07/14/20 0420  NA 124*   < > 128*   < > 129*   < > 130* 131* 134*  K 5.1   < >  4.1   < > 4.3   < > 3.8 3.6 3.6  CL 92*   < > 99   < > 100   < > 97* 97* 96*  CO2 21*   < > 20*   < > 19*   < > 22 23 28   GLUCOSE 219*   < > 107*   < > 143*   < > 181* 175* 165*  BUN 29*   < > 23   < > 13   < > 22 23 22   CREATININE 1.08   < > 0.79   < > 0.60*   < > 0.78 0.73 0.66  CALCIUM 9.0   < > 8.2*   < > 8.1*   < > 8.1* 8.3* 8.6*  PROT 7.8  --  6.2*  --  6.4*  --   --   --   --   ALBUMIN 3.2*  --  2.6*  --  2.6*  --   --   --   --   AST 19  --  15  --  20  --   --   --   --   ALT 16  --  13  --  13  --   --   --   --   ALKPHOS 125  --   106  --  113  --   --   --   --   BILITOT 0.8  --  0.6  --  1.0  --   --   --   --   GFRNONAA >60   < > >60   < > >60   < > >60 >60 >60  ANIONGAP 11   < > 9   < > 10   < > 11 11 10    < > = values in this interval not displayed.     Cardiac Enzymes: Cardiac Panel (last 3 results) Recent Labs    07/11/20 2056 07/12/20 0422 07/12/20 0940  TROPONINIHS 501* 304* 284*    BNP (last 3 results) Recent Labs    11/23/19 0724 05/02/20 1620 07/11/20 0425  BNP 362.7* 626.2* 2,329.1*    ProBNP (last 3 results) No results for input(s): PROBNP in the last 8760 hours.   DDimer No results for input(s): DDIMER in the last 168 hours.   Hemoglobin A1c:  Lab Results  Component Value Date   HGBA1C 7.2 (H) 05/12/2020   MPG 159.94 05/12/2020    TSH  Recent Labs    07/09/20 2356  TSH 0.996    Lipid Panel     Component Value Date/Time   CHOL 139 03/21/2018 0810   TRIG 71 03/21/2018 0810   HDL 37 (L) 03/21/2018 0810   CHOLHDL 3.8 03/21/2018 0810   VLDL 17.0 11/15/2014 1003   LDLCALC 86 03/21/2018 0810   LDLDIRECT 108.2 03/13/2010 1340    Imaging: DG Chest 1 View  Result Date: 07/12/2020 CLINICAL DATA:  Shortness of breath EXAM: CHEST  1 VIEW COMPARISON:  July 11, 2020 FINDINGS: No edema or airspace opacity. Heart is mildly enlarged with pulmonary vascularity normal. Patient is status post coronary artery bypass grafting. No adenopathy. There is arthropathy and postoperative change in each shoulder. Surgical clips noted in lower left neck region. There is aortic atherosclerosis. IMPRESSION: No edema or airspace opacity. Stable cardiomegaly. Status post coronary artery bypass grafting. Aortic Atherosclerosis (ICD10-I70.0). Electronically Signed   By: Lowella Grip III M.D.   On: 07/12/2020 17:30  DG CHEST PORT 1 VIEW  Result Date: 07/13/2020 CLINICAL DATA:  Respiratory failure with hypoxia. EXAM: PORTABLE CHEST 1 VIEW COMPARISON:  07/12/2020 FINDINGS: Postoperative  changes in the mediastinum. Cardiac enlargement with pulmonary vascular congestion. Streaky perihilar infiltrates developing since previous study suggesting edema or possibly multifocal pneumonia. No pleural effusions. No pneumothorax. Mediastinal contours appear intact. Degenerative changes in the spine and shoulders. IMPRESSION: Cardiac enlargement with pulmonary vascular congestion. Developing perihilar infiltrates suggesting edema or multifocal pneumonia. Electronically Signed   By: Lucienne Capers M.D.   On: 07/13/2020 07:01    Cardiac database: EKG: 07/09/2020: Sinus tachycardia, normal axis, consider old inferior infarct, without underlying injury pattern.  07/11/2020 3:50 AM: Sinus tachycardia, 132 bpm, normal axis, consider old inferior infarct, ST depressions in the lateral precordial leads V4-V6 suggestive of ischemia, rare PVCs.  07/11/2020 8:50 AM: Sinus tachycardia, 123 bpm, normal axis, consider old inferior infarct, without underlying injury pattern. Compared to prior EKG from the same day at 3:50 AM ST depressions have improved.  Echocardiogram: 07/10/2020 1. Technically difficult study with limited views 2. Left ventricular ejection fraction, by estimation, is 50 to 55%. The left ventricle has low normal function. Left ventricular diastolic parameters were normal. 3. Right ventricular systolic function is normal. The right ventricular size is mildly enlarged. 4. Left atrial size was mildly dilated. 5. The mitral valve is normal in structure. Mild mitral valve regurgitation. 6. The aortic valve is tricuspid. Aortic valve regurgitation is not visualized. Mild to moderate aortic valve sclerosis/calcification is present, without any evidence of aortic stenosis.  Scheduled Meds: . aspirin EC  81 mg Oral Daily  . carvedilol  3.125 mg Oral BID WC  . Chlorhexidine Gluconate Cloth  6 each Topical Daily  . docusate sodium  100 mg Oral BID  . insulin aspart  0-15 Units  Subcutaneous Q4H  . mouth rinse  15 mL Mouth Rinse q12n4p  . mouth rinse  15 mL Mouth Rinse BID  . pantoprazole  40 mg Oral Daily  . rOPINIRole  1 mg Oral TID  . tamsulosin  0.4 mg Oral Daily  . ticagrelor  90 mg Oral BID    Continuous Infusions: . sodium chloride Stopped (07/10/20 0748)  . penicillin g continuous IV infusion 12 Million Units (07/14/20 0841)    PRN Meds: acetaminophen, albuterol, bisacodyl, guaiFENesin, magnesium citrate, menthol-cetylpyridinium **OR** phenol, metoCLOPramide **OR** metoCLOPramide (REGLAN) injection, ondansetron **OR** ondansetron (ZOFRAN) IV, oxyCODONE, polyethylene glycol   IMPRESSION & RECOMMENDATIONS: [name] is a 74 y.o. male whose past medical history and cardiac risk factors include: Established CAD with prior CABGin 2017,PAD status post aortobifemoral bypassby Dr. Trula Slade in 2012,hypertension, hyperlipidemia, type 2 diabetes mellitus, history of right innominate artery high-grade stenosis, history of left CEAin 2012 due to retinal artery occlusion,former smoker,advanced age.  Non-STEMI: Most likely secondary to supply demand ischemia given his underlying CAD in the setting of sepsis, recent surgery, tachycardia, hypervolemia due to sepsis. -Patient continues to be chest pain-free. -Troponins have started to downtrend, peak troponin at 501 -Discontinue heparin for now. -Nitroglycerin drip held due to low blood pressures and no chest pain.  May use if needed. -Continue aspirin and Brilinta. -Carvedilol is held again secondary to low blood pressures.  -When able recommend reinitiating home medications.  Discussed with critical care medicine to start with beta-blockers followed by Imdur and then subsequently [facility]. -Telemetry reviewed.  Predominant normal sinus rhythm with episodes of isolated PVCs, ventricular runs, and short asymptomatic runs of NSVT. -Heart failure medications are  currently being held due to underlying sepsis.   Recommending restarting once sepsis resolves. -Continue diuresis as hemodynamics allow. -Discussed with the patient that his non-STEMI is most likely sequela of his underlying septic shock (blood cultures positive for strep and urine cultures positive for Pseudomonas.  Synovial fluid pathology pending). -Patient follows up with Dr. Laurance Flatten from [place] and based on his last encounter with him that is available from [facility] Everywhere patient has establish CAD with stable angina at baseline. I do not have any angiograms to review butbased on the office notes it appears that he has severe native CAD and his surgical revascularization anatomy notes patency of the LIMA to the LAD graft, sequential graft to the diagonal/ramus is patent but jump graft to the obtuse marginal branches occluded, SVG to PDA is occluded as well. In conclusion, the LAD distribution is being supplied by the LIMA and the SVG to RI/Diagonal branch,RCA is revascularized by collaterals from the LAD, only LCX distribution is not well revascularized. It appears that prior attempts were made to revascularize the LCX distribution but it was unsuccessful.Therefore the prior EKG findings and coronary anatomy as alluded by Dr. Nena Alexander last office note are concordant. -No acute intervention needed at this time as he is now asymptomatic and has known CAD as discussed above in the setting of sepsis leading to supply demand ischemia. -Recommend the patient follows up with Dr. Laurance Flatten upon discharge to continue his longitudinal cardiovascular care. -Continue your care regarding his underlying sepsis and would recommend reinitiation of home cardiac medications as his hemodynamics improved/allow.  Established CAD status post CABG without angina pectoris:  Restart his antianginal therapy once his hemodynamics improved: Start with carvedilol and Imdur.   Continue aspirin and Brilinta. Status post blood transfusion  07/12/2020 Recommend restarting home dose of pravastatin 20 mg p.o. nightly (as per cardiology office 04/29/2020 see care everywhere). Will check AST/ALT prior to doing so.   Secondary Diagnosis: Septic shock due to septic arthritis of the left shoulder and strep bacteremia: Blood cultures 07/09/2020 reported group B strep Urine culture 07/10/2020 reported Pseudomonas aeruginosa T-max over the last 24 hours 99.7 Currently managed by CCM and infectious disease teams.  Peripheral artery disease status post aortobifemoral bypass: Educated on importance of risk factor modifications. Continue aspirin. Will check AST/ALT if at baseline may consider restarting home dose pravastatin once cleared by CCM. Also on PCSK9 inhibitors as outpatient.  History of left carotid endarterectomy: Continue aspirin. Will check AST/ALT if at baseline may consider restarting home dose pravastatin once cleared by CCM. Patient also on PCSK9 inhibitors as outpatient.  Hypertension: Currently off vasopressor support but soft BP readings this morning.  Due to soft BP Imdur and Entresto currently held.  Currently managed by CCM.  Hyperlipidemia: Will check AST/ALT if at baseline may consider restarting home dose pravastatin once cleared by CCM. Also takes PCSK9 inhibitors as at home.  Type 2 diabetes mellitus: Follow sugars. Per ICU protocol.  Will sign off for now, please reach out if any questions or concerns arise during this hospitalization.  He would benefit from reestablishing with his primary cardiologist and was discharged at [place] with Dr. Laurance Flatten.   Patient's questions and concerns were addressed to his satisfaction. He voices understanding of the instructions provided during this encounter.  Plan of care also discussed with intensivist Dr. Shearon Stalls.  Total time spent: 35 minutes.  This note was created using a voice recognition software as a result there may be  grammatical  errors inadvertently enclosed that do not reflect the nature of this encounter. Every attempt is made to correct such errors.  Mechele Claude [facility]  Pager: [phone] Office: [phone] 07/14/2020, 10:03 AM

## 2020-07-14 NOTE — Progress Notes (Addendum)
Subjective: No new complaints   Antibiotics:  Anti-infectives (From admission, onward)   Start     Dose/Rate Route Frequency Ordered Stop   07/12/20 1800  penicillin G potassium 12 Million Units in dextrose 5 % 500 mL continuous infusion        12 Million Units 41.7 mL/hr over 12 Hours Intravenous Every 12 hours 07/12/20 1100     07/11/20 1800  cefTRIAXone (ROCEPHIN) 2 g in sodium chloride 0.9 % 100 mL IVPB  Status:  Discontinued        2 g 200 mL/hr over 30 Minutes Intravenous Every 24 hours 07/11/20 1235 07/12/20 1100   07/10/20 2200  cefTRIAXone (ROCEPHIN) 2 g in sodium chloride 0.9 % 100 mL IVPB  Status:  Discontinued        2 g 200 mL/hr over 30 Minutes Intravenous Every 24 hours 07/09/20 2347 07/10/20 0926   07/10/20 1000  vancomycin (VANCOCIN) IVPB 1000 mg/200 mL premix  Status:  Discontinued        1,000 mg 200 mL/hr over 60 Minutes Intravenous Every 12 hours 07/10/20 0033 07/11/20 1235   07/10/20 1000  ceFEPIme (MAXIPIME) 2 g in sodium chloride 0.9 % 100 mL IVPB  Status:  Discontinued        2 g 200 mL/hr over 30 Minutes Intravenous Every 8 hours 07/10/20 0939 07/11/20 1235   07/09/20 2100  vancomycin (VANCOREADY) IVPB 1750 mg/350 mL        1,750 mg 175 mL/hr over 120 Minutes Intravenous STAT 07/09/20 2045 07/09/20 2333   07/09/20 2045  vancomycin (VANCOCIN) IVPB 1000 mg/200 mL premix  Status:  Discontinued        1,000 mg 200 mL/hr over 60 Minutes Intravenous  Once 07/09/20 2041 07/09/20 2045   07/09/20 2045  cefTRIAXone (ROCEPHIN) 2 g in sodium chloride 0.9 % 100 mL IVPB        2 g 200 mL/hr over 30 Minutes Intravenous  Once 07/09/20 2041 07/09/20 2209      Medications: Scheduled Meds: . aspirin EC  81 mg Oral Daily  . carvedilol  3.125 mg Oral BID WC  . Chlorhexidine Gluconate Cloth  6 each Topical Daily  . docusate sodium  100 mg Oral BID  . enoxaparin (LOVENOX) injection  40 mg Subcutaneous Q24H  . insulin aspart  0-15 Units Subcutaneous Q4H  .  mouth rinse  15 mL Mouth Rinse q12n4p  . mouth rinse  15 mL Mouth Rinse BID  . pantoprazole  40 mg Oral Daily  . rOPINIRole  1 mg Oral TID  . tamsulosin  0.4 mg Oral Daily  . ticagrelor  90 mg Oral BID   Continuous Infusions: . sodium chloride Stopped (07/10/20 0748)  . penicillin g continuous IV infusion 12 Million Units (07/14/20 0841)   PRN Meds:.acetaminophen, albuterol, bisacodyl, guaiFENesin, magnesium citrate, menthol-cetylpyridinium **OR** phenol, metoCLOPramide **OR** metoCLOPramide (REGLAN) injection, ondansetron **OR** ondansetron (ZOFRAN) IV, oxyCODONE, polyethylene glycol    Objective: Weight change:   Intake/Output Summary (Last 24 hours) at 07/14/2020 1547 Last data filed at 07/14/2020 1500 Gross per 24 hour  Intake 2387.69 ml  Output 1420 ml  Net 967.69 ml   Blood pressure (!) 154/51, pulse (!) 102, temperature 98.7 F (37.1 C), temperature source Axillary, resp. rate 20, height 6' (1.829 m), weight 91 kg, SpO2 99 %. Temp:  [98.5 F (36.9 C)-98.8 F (37.1 C)] 98.7 F (37.1 C) (11/29 1220) Pulse Rate:  [90-108] 102 (11/29 1200) Resp:  [  16-25] 20 (11/29 1200) BP: (89-154)/(51-75) 154/51 (11/29 1200) SpO2:  [99 %-100 %] 99 % (11/29 1200) Arterial Line BP: (69-101)/(46-64) 70/46 (11/29 1042)  Physical Exam: General: Alert and awake, oriented in NAD, right arm with pressure weight on bandage HEENT: anicteric sclera, EOMI CVS regular rate, normal  Chest: , no wheezing, no respiratory distress Abdomen: soft non-distended,  Extremities: left shoulder with dressing Skin: no rashes Neuro: nonfocal  CBC:    BMET Recent Labs    07/13/20 0603 07/14/20 0420  NA 131* 134*  K 3.6 3.6  CL 97* 96*  CO2 23 28  GLUCOSE 175* 165*  BUN 23 22  CREATININE 0.73 0.66  CALCIUM 8.3* 8.6*     Liver Panel  Recent Labs    07/14/20 0420  AST 67*  ALT 34       Sedimentation Rate No results for input(s): ESRSEDRATE in the last 72 hours. C-Reactive  Protein No results for input(s): CRP in the last 72 hours.  Micro Results: Recent Results (from the past 720 hour(s))  Anaerobic and Aerobic Culture     Status: None   Collection Time: 06/24/20 11:33 AM   Specimen: Joint, Left Shoulder; Synovial Fluid  Result Value Ref Range Status   MICRO NUMBER: [id number]  Final   SPECIMEN QUALITY: Adequate  Final   Source: FLUID, SYNOVIAL  Final   STATUS: FINAL  Final   GRAM STAIN:   Final    Many Polymorphonuclear leukocytes No organisms seen   ANA RESULT: No anaerobes isolated.  Final   MICRO NUMBER: [id number]  Final   SPECIMEN QUALITY: Adequate  Final   SOURCE: FLUID, SYNOVIAL  Final   STATUS: FINAL  Final   AER RESULT: No Growth  Final  Blood Culture (routine x 2)     Status: None (Preliminary result)   Collection Time: 07/09/20  9:05 PM   Specimen: BLOOD LEFT FOREARM  Result Value Ref Range Status   Specimen Description   Final    BLOOD LEFT FOREARM Performed at [facility] [address] [place], [place] [address]    Special Requests   Final    BOTTLES DRAWN AEROBIC AND ANAEROBIC Blood Culture results may not be optimal due to an excessive volume of blood received in culture bottles Performed at [facility] [address] [place], [place] [address]    Culture   Final    NO GROWTH 4 DAYS Performed at [facility], [place] [address], [place], [place] [address]    Report Status PENDING  Incomplete  Blood Culture (routine x 2)     Status: Abnormal   Collection Time: 07/09/20  9:05 PM   Specimen: BLOOD RIGHT FOREARM  Result Value Ref Range Status   Specimen Description   Final    BLOOD RIGHT FOREARM Performed at [facility] [address], [place], [place] [address]    Special Requests   Final    BOTTLES DRAWN AEROBIC AND ANAEROBIC Blood Culture adequate volume Performed at [facility] [address], [place],  [address]    Culture   Setup Time   Final    GRAM POSITIVE COCCI IN CHAINS IN BOTH AEROBIC AND ANAEROBIC BOTTLES CRITICAL RESULT CALLED TO, READ BACK BY AND VERIFIED WITHDomenick Gong PHARMD [id number] 07/10/20 A BROWNING Performed at [facility], [place] [address] [place], [place] [address]    Culture GROUP B STREP(S.AGALACTIAE)ISOLATED (A)  Final   Report  Status 07/12/2020 FINAL  Final   Organism ID, Bacteria GROUP B STREP(S.AGALACTIAE)ISOLATED  Final      Susceptibility   Group b strep(s.agalactiae)isolated - MIC*    CLINDAMYCIN >=1 RESISTANT Resistant     AMPICILLIN <=0.25 SENSITIVE Sensitive     ERYTHROMYCIN >=8 RESISTANT Resistant     VANCOMYCIN 0.5 SENSITIVE Sensitive     CEFTRIAXONE <=0.12 SENSITIVE Sensitive     LEVOFLOXACIN 0.5 SENSITIVE Sensitive     PENICILLIN Value in next row Sensitive      SENSITIVE0.06    * GROUP B STREP(S.AGALACTIAE)ISOLATED  Blood Culture ID Panel (Reflexed)     Status: Abnormal   Collection Time: 07/09/20  9:05 PM  Result Value Ref Range Status   Enterococcus faecalis NOT DETECTED NOT DETECTED Final   Enterococcus Faecium NOT DETECTED NOT DETECTED Final   Listeria monocytogenes NOT DETECTED NOT DETECTED Final   Staphylococcus species NOT DETECTED NOT DETECTED Final   Staphylococcus aureus (BCID) NOT DETECTED NOT DETECTED Final   Staphylococcus epidermidis NOT DETECTED NOT DETECTED Final   Staphylococcus lugdunensis NOT DETECTED NOT DETECTED Final   Streptococcus species DETECTED (A) NOT DETECTED Final    Comment: CRITICAL RESULT CALLED TO, READ BACK BY AND VERIFIED WITH: S CHRISTY PHARMD 1631 07/10/20 A BROWNING    Streptococcus agalactiae DETECTED (A) NOT DETECTED Final    Comment: CRITICAL RESULT CALLED TO, READ BACK BY AND VERIFIED WITH: S CHRISTY PHARMD 1631 07/10/20 A BROWNING    Streptococcus pneumoniae NOT DETECTED NOT DETECTED Final   Streptococcus pyogenes NOT DETECTED NOT DETECTED Final   A.calcoaceticus-baumannii NOT DETECTED NOT DETECTED Final    Bacteroides fragilis NOT DETECTED NOT DETECTED Final   Enterobacterales NOT DETECTED NOT DETECTED Final   Enterobacter cloacae complex NOT DETECTED NOT DETECTED Final   Escherichia coli NOT DETECTED NOT DETECTED Final   Klebsiella aerogenes NOT DETECTED NOT DETECTED Final   Klebsiella oxytoca NOT DETECTED NOT DETECTED Final   Klebsiella pneumoniae NOT DETECTED NOT DETECTED Final   Proteus species NOT DETECTED NOT DETECTED Final   Salmonella species NOT DETECTED NOT DETECTED Final   Serratia marcescens NOT DETECTED NOT DETECTED Final   Haemophilus influenzae NOT DETECTED NOT DETECTED Final   Neisseria meningitidis NOT DETECTED NOT DETECTED Final   Pseudomonas aeruginosa NOT DETECTED NOT DETECTED Final   Stenotrophomonas maltophilia NOT DETECTED NOT DETECTED Final   Candida albicans NOT DETECTED NOT DETECTED Final   Candida auris NOT DETECTED NOT DETECTED Final   Candida glabrata NOT DETECTED NOT DETECTED Final   Candida krusei NOT DETECTED NOT DETECTED Final   Candida parapsilosis NOT DETECTED NOT DETECTED Final   Candida tropicalis NOT DETECTED NOT DETECTED Final   Cryptococcus neoformans/gattii NOT DETECTED NOT DETECTED Final    Comment: Performed at [facility], [address], [place], [place] [address]  Resp Panel by RT-PCR (Flu A&B, Covid) Nasopharyngeal Swab     Status: None   Collection Time: 07/09/20  9:06 PM   Specimen: Nasopharyngeal Swab; Nasopharyngeal(NP) swabs in vial transport medium  Result Value Ref Range Status   SARS Coronavirus 2 by RT PCR NEGATIVE NEGATIVE Final    Comment: (NOTE) SARS-CoV-2 target nucleic acids are NOT DETECTED.  The SARS-CoV-2 RNA is generally detectable in upper respiratory specimens during the acute phase of infection. The lowest concentration of SARS-CoV-2 viral copies this assay can detect is 138 copies/mL. A negative result does not preclude SARS-Cov-2 infection and should not be used as the sole basis for treatment or other  patient management decisions. A negative result may occur with  improper specimen collection/handling, submission of specimen other than nasopharyngeal swab, presence of viral mutation(s) within the areas targeted by this assay, and inadequate number of viral copies(<138 copies/mL). A negative result must be combined with clinical observations, patient history, and epidemiological information. The expected result is Negative.  Fact Sheet for Patients:  EntrepreneurPulse.com.au  Fact Sheet for Healthcare Providers:  IncredibleEmployment.be  This test is no t yet approved or cleared by the [place] FDA and  has been authorized for detection and/or diagnosis of SARS-CoV-2 by FDA under an Emergency Use Authorization (EUA). This EUA will remain  in effect (meaning this test can be used) for the duration of the COVID-19 declaration under Section 564(b)(1) of the Act, 21 U.S.C.section 360bbb-3(b)(1), unless the authorization is terminated  or revoked sooner.       Influenza A by PCR NEGATIVE NEGATIVE Final   Influenza B by PCR NEGATIVE NEGATIVE Final    Comment: (NOTE) The Xpert Xpress SARS-CoV-2/FLU/RSV plus assay is intended as an aid in the diagnosis of influenza from Nasopharyngeal swab specimens and should not be used as a sole basis for treatment. Nasal washings and aspirates are unacceptable for Xpert Xpress SARS-CoV-2/FLU/RSV testing.  Fact Sheet for Patients: EntrepreneurPulse.com.au  Fact Sheet for Healthcare Providers: IncredibleEmployment.be  This test is not yet approved or cleared by the [place] FDA and has been authorized for detection and/or diagnosis of SARS-CoV-2 by FDA under an Emergency Use Authorization (EUA). This EUA will remain in effect (meaning this test can be used) for the duration of the COVID-19 declaration under Section 564(b)(1) of the Act, 21 U.S.C. section  360bbb-3(b)(1), unless the authorization is terminated or revoked.  Performed at [facility], [place] [address], [place], [place] [address]   Body fluid culture     Status: None   Collection Time: 07/09/20 10:00 PM   Specimen: Synovium; Body Fluid  Result Value Ref Range Status   Specimen Description   Final    SYNOVIAL LEFT SHOULDER Performed at [facility] [address], [place], [place] [address]    Special Requests   Final    NONE Performed at [facility], [place] [address] [place], [place] [address]    Gram Stain   Final    MODERATE WBC PRESENT, PREDOMINANTLY PMN NO ORGANISMS SEEN RESULT CALLED TO, READ BACK BY AND VERIFIED WITH: RN J DAVISON AT 2251 07/09/20 CRUICKSHANK A Performed at [facility], [place] [address] [place], [place] [address]    Culture   Final    RARE GROUP B STREP(S.AGALACTIAE)ISOLATED TESTING AGAINST S. AGALACTIAE NOT ROUTINELY PERFORMED DUE TO PREDICTABILITY OF AMP/PEN/VAN SUSCEPTIBILITY. CRITICAL RESULT CALLED TO, READ BACK BY AND VERIFIED WITH: A. MANSFIELD RN, AT 7425 07/11/20 BY D. VANHOOK REGARDING GROWTH Performed at [facility], [place] [address], [place], [place] [address]    Report Status 07/12/2020 FINAL  Final  Urine culture     Status: Abnormal   Collection Time: 07/10/20  1:00 AM   Specimen: In/Out Cath Urine  Result Value Ref Range Status   Specimen Description   Final    IN/OUT CATH URINE Performed at [facility] [address] [place], [place] [address]    Special Requests   Final    NONE Performed at [facility], [place] [address] [place], [place] [address]    Culture (A)  Final    10,000  COLONIES/mL PSEUDOMONAS AERUGINOSA 10,000 COLONIES/mL STREPTOCOCCUS AGALACTIAE TESTING AGAINST S. AGALACTIAE NOT ROUTINELY PERFORMED DUE TO PREDICTABILITY OF AMP/PEN/VAN SUSCEPTIBILITY. Performed at [facility],  [place] [address], [place], [place] [address]    Report Status 07/12/2020 FINAL  Final   Organism ID, Bacteria PSEUDOMONAS AERUGINOSA (A)  Final      Susceptibility   Pseudomonas aeruginosa - MIC*    CEFTAZIDIME 2 SENSITIVE Sensitive     CIPROFLOXACIN <=0.25 SENSITIVE Sensitive     GENTAMICIN <=1 SENSITIVE Sensitive     IMIPENEM 2 SENSITIVE Sensitive     PIP/TAZO <=4 SENSITIVE Sensitive     CEFEPIME 2 SENSITIVE Sensitive     * 10,000 COLONIES/mL PSEUDOMONAS AERUGINOSA  MRSA PCR Screening     Status: None   Collection Time: 07/10/20  1:15 AM   Specimen: Nasopharyngeal  Result Value Ref Range Status   MRSA by PCR NEGATIVE NEGATIVE Final    Comment:        The GeneXpert MRSA Assay (FDA approved for NASAL specimens only), is one component of a comprehensive MRSA colonization surveillance program. It is not intended to diagnose MRSA infection nor to guide or monitor treatment for MRSA infections. Performed at [facility], [place] [address], [place], [place] [address]   Culture, blood (routine x 2)     Status: None (Preliminary result)   Collection Time: 07/11/20  9:55 AM   Specimen: BLOOD  Result Value Ref Range Status   Specimen Description   Final    BLOOD LEFT ANTECUBITAL Performed at [place] [address] [place], [place] [address]    Special Requests   Final    BOTTLES DRAWN AEROBIC ONLY Blood Culture adequate volume Performed at [facility] [address] [place], [place] [address]    Culture   Final    NO GROWTH 3 DAYS Performed at [facility], [place] [address] [place], [place] [address]    Report Status PENDING  Incomplete  Culture, blood (routine x 2)     Status: None (Preliminary result)   Collection Time: 07/11/20 10:01 AM   Specimen: BLOOD LEFT HAND  Result Value Ref Range Status   Specimen Description   Final    BLOOD LEFT HAND Performed at [facility] [address], [place], [place] [address]    Special Requests   Final    BOTTLES DRAWN AEROBIC ONLY Blood Culture adequate volume Performed at [facility] [address] [place], [place] [address]    Culture   Final    NO GROWTH 3 DAYS Performed at [facility], [place] [address] [place], [place] [address]    Report Status PENDING  Incomplete  Body fluid culture     Status: None (Preliminary result)   Collection Time: 07/13/20  8:45 AM   Specimen: Joint, Right Shoulder; Synovial Fluid  Result Value Ref Range Status   Specimen Description   Final    JOINT FLUID Performed at [facility], [place] [address], [place], [place] [address]    Special Requests   Final    RIGHT SHOULDER Performed at [facility] [address], [place], [place] [address]    Gram Stain   Final    RARE WBC PRESENT, PREDOMINANTLY MONONUCLEAR NO ORGANISMS SEEN    Culture   Final    NO GROWTH < 24 HOURS Performed at [facility], [place] [address],  [place], [place] [address]    Report Status PENDING  Incomplete    Studies/Results: DG Chest 1 View  Result Date: 07/12/2020 CLINICAL DATA:  Shortness of breath EXAM: CHEST  1 VIEW COMPARISON:  July 11, 2020 FINDINGS: No edema or airspace opacity. Heart is mildly enlarged with pulmonary vascularity normal. Patient is status post coronary artery bypass grafting. No adenopathy. There is arthropathy and postoperative change in each shoulder. Surgical clips noted in lower left neck region. There is aortic atherosclerosis. IMPRESSION: No edema or airspace opacity. Stable cardiomegaly. Status post coronary artery bypass grafting. Aortic Atherosclerosis (ICD10-I70.0). Electronically Signed   By: Lowella Grip III M.D.   On: 07/12/2020 17:30   DG CHEST PORT 1 VIEW  Result Date: 07/13/2020 CLINICAL DATA:  Respiratory failure with hypoxia. EXAM: PORTABLE CHEST 1 VIEW COMPARISON:  07/12/2020 FINDINGS: Postoperative  changes in the mediastinum. Cardiac enlargement with pulmonary vascular congestion. Streaky perihilar infiltrates developing since previous study suggesting edema or possibly multifocal pneumonia. No pleural effusions. No pneumothorax. Mediastinal contours appear intact. Degenerative changes in the spine and shoulders. IMPRESSION: Cardiac enlargement with pulmonary vascular congestion. Developing perihilar infiltrates suggesting edema or multifocal pneumonia. Electronically Signed   By: Lucienne Capers M.D.   On: 07/13/2020 07:01      Assessment/Plan:  INTERVAL HISTORY: repeat blood cultures are sterile   Active Problems:   Peripheral artery disease (HCC)   Hx of CABG   Severe sepsis (HCC)   Shortness of breath   Status post aortobifemoral bypass surgery   History of carotid endarterectomy   Type 2 diabetes mellitus with hyperlipidemia (HCC)   Diabetes mellitus type II, non insulin dependent ([place])   Acute respiratory failure with hypoxemia ([place])    [name] is a 74 y.o. male with Group B streptococcal bacteremia and septic shock due to left septic shoulder sp I and D. He has had multiple shoulder aspirations with elevated WBC in them. He also had a right shoulder aspirate performed yesterday with 16900 WBC, 97% PMNs.   While the latter cell count is not typical for infected shoulder the patient had been on IV antibiotics for 4 days prior to joint aspirate   Would monitor the right shoulder and have low threshold for surgical I and D  In the interim we will plan on 6 weeks of parenteral IV antibiotics (IV PCN)  I will order PICC and OPAT and arrange clinic followup.  Diagnosis: PJI  Culture Result: Group B strep  Allergies  Allergen Reactions  . Ivp Dye [Iodinated Diagnostic Agents]     Blood Pressure dropped----pt was pre-medicated with 13 hour prep and did fine with pre-meds--amy 03/09/11   . Penicillins Nausea Only    REACTION: stomach pain(IV ok) Has patient  had a PCN reaction causing immediate rash, facial/tongue/throat swelling, SOB or lightheadedness with hypotension: No Has patient had a PCN reaction causing severe rash involving mucus membranes or skin necrosis: No Has patient had a PCN reaction that required hospitalization: No Has patient had a PCN reaction occurring within the last 10 years: No If all of the above answers are "NO", then may proceed with Cephalosporin use.   . Sulfa Antibiotics Hives  . Sulfonamide Derivatives Hives and Itching  . Lisinopril Cough  . Metoprolol Nausea And Vomiting  . Statins Other (See Comments)    myalgias  . Propoxyphene N-Acetaminophen Other (See Comments)    Sharp pains- headache    OPAT Orders Discharge antibiotics:  Penicillin   Duration:  6 weeks  End Date:  January 6th, 2022  [facility] Care Per Protocol:    Labs  weekly while on IV antibiotics: _x_ CBC with differential _x_ BMP w GFR/CMP _x_ CRP _x_ ESR    _x_ Please pull PIC at completion of IV antibiotics __ Please leave PIC in place until doctor has seen patient or been notified  Fax weekly labs to [phone]  Clinic Follow Up Appt:   [name] has an appointment on 08/21/2020 with Dr [name] at [facility] for Infectious Disease is located in the [facility] at  [place] in [place].  [address], which is located to the left of the elevators.  Phone: [phone]  Fax: [phone]  [email or web address]  He should arrive 15 minutes prior to his appt.        LOS: 5 days   Alcide Evener 07/14/2020, 3:47 PM

## 2020-07-14 NOTE — Progress Notes (Signed)
ANTICOAGULATION CONSULT NOTE -  Consult  Pharmacy Consult for IV heparin Indication: chest pain/ACS  Allergies  Allergen Reactions  . Ivp Dye [Iodinated Diagnostic Agents]     Blood Pressure dropped----pt was pre-medicated with 13 hour prep and did fine with pre-meds--amy 03/09/11   . Penicillins Nausea Only    REACTION: stomach pain(IV ok) Has patient had a PCN reaction causing immediate rash, facial/tongue/throat swelling, SOB or lightheadedness with hypotension: No Has patient had a PCN reaction causing severe rash involving mucus membranes or skin necrosis: No Has patient had a PCN reaction that required hospitalization: No Has patient had a PCN reaction occurring within the last 10 years: No If all of the above answers are "NO", then may proceed with Cephalosporin use.   . Sulfa Antibiotics Hives  . Sulfonamide Derivatives Hives and Itching  . Lisinopril Cough  . Metoprolol Nausea And Vomiting  . Statins Other (See Comments)    myalgias  . Propoxyphene N-Acetaminophen Other (See Comments)    Sharp pains- headache    Patient Measurements: Height: 6' (182.9 cm) Weight: 91 kg (200 lb 9.9 oz) IBW/kg (Calculated) : 77.6 Heparin Dosing Weight: n/a. Use TBW = 91 kg  Vital Signs: Temp: 98.8 F (37.1 C) (11/29 0800) Temp Source: Axillary (11/29 0800) Pulse Rate: 98 (11/29 0800)  Labs: Recent Labs     0000 07/11/20 1636 07/11/20 2056 07/12/20 0422 07/12/20 0940 07/12/20 1614 07/12/20 1704 07/13/20 0123 07/13/20 0603 07/13/20 1041 07/13/20 1830 07/14/20 0420  HGB   < >  --   --  7.9*  --   --  9.2*  --  10.2*  --   --  9.8*  HCT   < >  --   --  23.2*  --   --  27.9*  --  30.8*  --   --  29.1*  PLT  --   --   --  320  --   --   --   --  413*  --   --  403*  HEPARINUNFRC  --    < >  --  <0.10*  --    < >  --    < >  --  0.62 0.57 0.55  CREATININE   < >  --   --  0.58*  --   --  0.78  --  0.73  --   --  0.66  TROPONINIHS  --   --  501* 304* 284*  --   --   --   --    --   --   --    < > = values in this interval not displayed.    Estimated Creatinine Clearance: 88.9 mL/min (by C-G formula based on SCr of 0.66 mg/dL).  Assessment: Pharmacy is consulted to dose heparin in 74 yo male diagnosed with ACS. Pt complaining of CP with SOB and diaphoresis. 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 Currently on aspirin 81 mg PO daily and ticagrelor 90 mg PO BID. Not on any blood thinners PTA.   Today, 07/14/20  Heparin level = 0.55 remains therapeutic on heparin infusion of 2150 units/hr  CBC: Hgb (9.8) slightly low but stable; Plt (403) elevated  Confirmed with RN that heparin infusing at correct rate. No signs of bleeding.  Goal of Therapy:  Heparin level 0.3-0.7 units/ml Monitor platelets by anticoagulation protocol: Yes   Plan:   Continue heparin infusion at current rate  of 2150 units/hr   Daily CBC and HL while on heparin   Monitor for signs and symptoms of bleeding  Lenis Noon, PharmD 07/14/20 8:46 AM

## 2020-07-14 NOTE — NC FL2 (Signed)
[place] LEVEL OF CARE SCREENING TOOL     IDENTIFICATION  Patient Name: [name] Birthdate: 08/25/1945 Sex: male Admission Date (Current Location): 07/09/2020  [facility] and [place] Number:  Herbalist and Address:  [facility],  [address] [place], [place]      Provider Number: [id number]  Attending Physician Name and Address:  Spero Geralds, MD  Relative Name and Phone Number:       Current Level of Care: Hospital Recommended Level of Care: [place] Prior Approval Number:    Date Approved/Denied:   PASRR Number: [id number]  Discharge Plan: SNF    Current Diagnoses: Patient Active Problem List   Diagnosis Date Noted  . Acute respiratory failure with hypoxemia ([place]) 07/12/2020  . Shortness of breath   . Status post aortobifemoral bypass surgery   . History of carotid endarterectomy   . Type 2 diabetes mellitus with hyperlipidemia ([place])   . Diabetes mellitus type II, non insulin dependent ([place])   . Severe sepsis ([place]) 07/09/2020  . Pressure injury of skin 05/12/2020  . Amputation of right middle finger 05/12/2020  . Long term (current) use of opiate analgesic 05/07/2020  . Pain in left foot 05/07/2020  . Neck pain 05/07/2020  . Chronic systolic congestive heart failure, NYHA class 3 ([place]) 01/08/2020  . Constipation 01/07/2020  . CAD (coronary artery disease) 01/03/2020  . Hyperlipidemia associated with type 2 diabetes mellitus ([place]) 01/03/2020  . S/P coronary artery stent placement 01/03/2020  . Major depressive disorder, recurrent episode, severe, with psychotic behavior ([place]) 12/29/2019  . Delusional disorder ([place]) 12/29/2019  . NSTEMI (non-ST elevated myocardial infarction) ([place]) 05/19/2019  . MRSA infection 01/18/2019  . Occipital neuralgia 01/18/2019  . Osteomyelitis of left foot ([place]) 01/18/2019  . Type 2 diabetes mellitus with diabetic peripheral angiopathy without gangrene, without  long-term current use of insulin ([place]) 01/18/2019  . Acute on chronic diastolic congestive heart failure ([place]) 01/15/2019  . Varus foot deformity, acquired, left 11/28/2018  . Generalized anxiety disorder 07/03/2018  . Restless leg syndrome 07/03/2018  . Paranoia ([place]) 07/03/2018  . PTSD (post-traumatic stress disorder) 05/29/2018  . Abscess of right lower leg   . Severe protein-calorie malnutrition ([place])   . 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 Diabetic polyneuropathy associated with type 2 diabetes mellitus ([place])   . Cellulitis 04/01/2018  . Cellulitis of right lower extremity 04/01/2018  . Tachycardia 04/01/2018  . Hx of CABG 09/22/2015  . Unstable angina ([place]) 09/20/2015  . Chest pain, rule out acute myocardial infarction 09/18/2015  . Diabetes mellitus type 2 with peripheral artery disease ([place]) 09/18/2015  . Hyponatremia 09/18/2015  . Chest pain on exertion 09/18/2015  . Varicose veins of bilateral lower extremities with other complications 37/90/2409  . Occlusion and stenosis of carotid artery without mention of cerebral infarction 10/18/2011  . SINUSITIS, ACUTE 03/13/2010  . BACK PAIN 11/13/2009  . FATIGUE 10/17/2009  . Diabetic neuropathy associated with diabetes mellitus due to underlying condition ([place]) 08/29/2009  . Vitamin D deficiency 08/29/2009  . Hyposmolality and/or hyponatremia 08/29/2009  . VISUAL ACUITY, DECREASED, LEFT EYE 08/29/2009  . Peripheral artery disease ([place]) 08/29/2009  . SINUSITIS, CHRONIC 08/29/2009  . NEPHROLITHIASIS 08/29/2009  . FIBROMYALGIA 08/29/2009  . BICEPS TENDON RUPTURE, RIGHT 08/11/2009  . DIABETES MELLITUS, TYPE II 08/05/2009  . Hyperlipidemia 08/05/2009  . ANEMIA-NOS 08/05/2009  . ANXIETY 08/05/2009  . DEPRESSION 08/05/2009  .  Chronic pain syndrome 08/05/2009  . Essential hypertension 08/05/2009  . GERD 08/05/2009  . BENIGN PROSTATIC HYPERTROPHY 08/05/2009  . [place] DISEASE, LUMBAR  08/05/2009  . Osteoporosis 08/05/2009    Orientation RESPIRATION BLADDER Height & Weight     Self, Situation, Time  Normal Continent Weight: 91 kg Height:  6' (182.9 cm)  BEHAVIORAL SYMPTOMS/MOOD NEUROLOGICAL BOWEL NUTRITION STATUS      Continent Diet (regular)  AMBULATORY STATUS COMMUNICATION OF NEEDS Skin   Extensive Assist Verbally Normal                       Personal Care Assistance Level of Assistance  Bathing, Feeding, Dressing Bathing Assistance: Limited assistance Feeding assistance: Limited assistance Dressing Assistance: Limited assistance     Functional Limitations Info  Sight, Hearing, Speech Sight Info: Adequate Hearing Info: Adequate Speech Info: Adequate    SPECIAL CARE FACTORS FREQUENCY  PT (By licensed PT), OT (By licensed OT)     PT Frequency: 5xweekly OT Frequency: 5 x weekly            Contractures Contractures Info: Not present    Additional Factors Info  Code Status, Allergies Code Status Info: full Allergies Info: ivp dye,sulfa, PNC, lisinopril, metoprolol           Current Medications (07/14/2020):  This is the current hospital active medication list Current Facility-Administered Medications  Medication Dose Route Frequency Provider Last Rate Last Admin  . 0.9 %  sodium chloride infusion  250 mL Intravenous Continuous Shuford, Olivia Mackie, PA-C   Held at 07/10/20 [phone]  . acetaminophen (TYLENOL) suppository 650 mg  650 mg Rectal Q4H PRN Stretch, Marily Lente, MD   650 mg at 07/12/20 2357  . albuterol (PROVENTIL) (2.5 MG/3ML) 0.083% nebulizer solution 1.25 mg  1.25 mg Nebulization Q4H PRN Tanda Rockers, MD      . aspirin EC tablet 81 mg  81 mg Oral Daily Shuford, Tracy, PA-C   81 mg at 07/14/20 1022  . bisacodyl (DULCOLAX) EC tablet 5 mg  5 mg Oral Daily PRN Shuford, Tracy, PA-C      . carvedilol (COREG) tablet 3.125 mg  3.125 mg Oral BID WC Tanda Rockers, MD   3.125 mg at 07/13/20 1651  . Chlorhexidine Gluconate Cloth 2 % PADS 6 each   6 each Topical Daily Shuford, Tracy, PA-C   6 each at 07/14/20 0427  . docusate sodium (COLACE) capsule 100 mg  100 mg Oral BID Shuford, Tracy, PA-C   100 mg at 07/14/20 1022  . enoxaparin (LOVENOX) injection 40 mg  40 mg Subcutaneous Q24H Spero Geralds, MD      . guaiFENesin (ROBITUSSIN) 100 MG/5ML solution 100 mg  5 mL Oral Q4H PRN Tanda Rockers, MD   100 mg at 07/13/20 0435  . insulin aspart (novoLOG) injection 0-15 Units  0-15 Units Subcutaneous Q4H Shuford, Tracy, PA-C   3 Units at 07/14/20 1223  . magnesium citrate solution 1 Bottle  1 Bottle Oral Once PRN Shuford, Tracy, PA-C      . MEDLINE mouth rinse  15 mL Mouth Rinse q12n4p Shuford, Tracy, PA-C   15 mL at 07/14/20 1222  . MEDLINE mouth rinse  15 mL Mouth Rinse BID Shuford, Tracy, PA-C   15 mL at 07/14/20 1023  . menthol-cetylpyridinium (CEPACOL) lozenge 3 mg  1 lozenge Oral PRN Shuford, Tracy, PA-C       Or  . phenol (CHLORASEPTIC) mouth spray 1 spray  1 spray  Mouth/Throat PRN Shuford, Olivia Mackie, PA-C      . metoCLOPramide (REGLAN) tablet 5-10 mg  5-10 mg Oral Q8H PRN Shuford, Olivia Mackie, PA-C       Or  . metoCLOPramide (REGLAN) injection 5-10 mg  5-10 mg Intravenous Q8H PRN Shuford, Tracy, PA-C      . ondansetron (ZOFRAN) tablet 4 mg  4 mg Oral Q6H PRN Shuford, Tracy, PA-C       Or  . ondansetron (ZOFRAN) injection 4 mg  4 mg Intravenous Q6H PRN Shuford, Tracy, PA-C   4 mg at 07/14/20 0830  . oxyCODONE (Oxy IR/ROXICODONE) immediate release tablet 5-10 mg  5-10 mg Oral Q6H PRN Shuford, Tracy, PA-C   5 mg at 07/14/20 0426  . pantoprazole (PROTONIX) EC tablet 40 mg  40 mg Oral Daily Tanda Rockers, MD   40 mg at 07/14/20 1022  . penicillin G potassium 12 Million Units in dextrose 5 % 500 mL continuous infusion  12 Million Units Intravenous Q12H Mignon Pine, DO 41.7 mL/hr at 07/14/20 0841 12 Million Units at 07/14/20 0841  . polyethylene glycol (MIRALAX / GLYCOLAX) packet 17 g  17 g Oral Daily PRN Shuford, Tracy, PA-C      . rOPINIRole  (REQUIP) tablet 1 mg  1 mg Oral TID Noemi Chapel P, DO   1 mg at 07/14/20 1022  . tamsulosin (FLOMAX) capsule 0.4 mg  0.4 mg Oral Daily Spero Geralds, MD   0.4 mg at 07/14/20 1022  . ticagrelor (BRILINTA) tablet 90 mg  90 mg Oral BID Shuford, Olivia Mackie, PA-C   90 mg at 07/14/20 1022     Discharge Medications: Please see discharge summary for a list of discharge medications.  Relevant Imaging Results:  Relevant Lab Results:   Additional Information [id number]  Leeroy Cha, RN

## 2020-07-14 NOTE — Progress Notes (Addendum)
NAME:  [name], MRN:  [id number], DOB:  04-24-1946, LOS: 5 ADMISSION DATE:  07/09/2020, CONSULTATION DATE: 07/09/2020 REFERRING MD:  Dr. Regenia Skeeter, ER, CHIEF COMPLAINT:  Sepsis   Brief History   74 yo male former smoker with hx of chronic Left shoulder pain requiring arthrocentesis developed weakness, fever 102F, and hypotension (BP 74/57).  Found to have swollen/red left arm.  Had arthrocentesis in ER and started on antibiotics.  PCCM asked to admit to ICU to manage sepsis.  History of present illness   Patient lethargic and not able to provide accurate history.  History from chart and ER staff.  Past Medical History  Vit D deficiency, Varicose veins, RLS, PAD, Peripheral neuropathy, Osteoporosis, CAD with NSTEMI in 2021--  Not fully revascularized, Hyponatremia, HTN, HLD, DM type 2, Diabetic foot, Depression, COPD, Chronic pain, BPH, Blind Lt eye, Anxiety, GERD, BPH  Significant Hospital Events   11/24 Admit  Consults:  [facility]- [facility]   11/26  ID  11/25   Procedures:  I&D left shoulder 11/25  Significant Diagnostic Tests:  Lt shoulder arthrocentesis 11/24 >> brown/turbid, WBC 30,597 (96% neutrophils) >>>  see micro data Transthoracic echo 11/25> LVEF 13-08%, normal diastolic function. Left atrial dilation. Mildly enlarged RV. Mild MR. Ultrasound right lower extremity 11/26 > neg  Micro Data:  COVID/Flu 11/24 >> negative Lt shoulder synovial fluid 11/24 >> strep agalactiae Blood 11/24 >> strep agalactiae sensitive to rocephin BC x 2 11/24 >>> Urine 11/25>>  Pseudomonas x 10k plus 10k strep agalatiae /pansensitive including rocephin MRSA PCR 11/25 >> negative BC x 2  11/26 >>> R shoulder tap 11/28 >>>   Antimicrobials:  Vancomycin 11/24 >> 11/26  Rocephin on 11/24  And again  11/26 only Cefepime 11/25 > 11/26    PCN  G  11/27     Scheduled Meds: . aspirin EC  81 mg Oral Daily  . carvedilol  3.125 mg Oral BID WC  . Chlorhexidine  Gluconate Cloth  6 each Topical Daily  . docusate sodium  100 mg Oral BID  . insulin aspart  0-15 Units Subcutaneous Q4H  . mouth rinse  15 mL Mouth Rinse q12n4p  . mouth rinse  15 mL Mouth Rinse BID  .  morphine injection  2 mg Intravenous Once  . pantoprazole  40 mg Oral Daily  . rOPINIRole  1 mg Oral TID  . ticagrelor  90 mg Oral BID   Continuous Infusions: . sodium chloride Stopped (07/10/20 0748)  . heparin 2,150 Units/hr (07/14/20 0600)  . penicillin g continuous IV infusion 12 Million Units (07/14/20 0841)   PRN Meds:.acetaminophen, albuterol, bisacodyl, guaiFENesin, magnesium citrate, menthol-cetylpyridinium **OR** phenol, metoCLOPramide **OR** metoCLOPramide (REGLAN) injection, morphine injection, ondansetron **OR** ondansetron (ZOFRAN) IV, oxyCODONE, polyethylene glycol  Interim history/subjective:  No overnight issues. A little low Bps this morning.  Still having shoulder pain.   Objective   Blood pressure (!) 89/63, pulse 98, temperature 98.8 F (37.1 C), temperature source Axillary, resp. rate 16, height 6' (1.829 m), weight 91 kg, SpO2 100 %.        Intake/Output Summary (Last 24 hours) at 07/14/2020 0844 Last data filed at 07/14/2020 0600 Gross per 24 hour  Intake 1962.04 ml  Output 1875 ml  Net 87.04 ml   Filed Weights   07/09/20 2017 07/10/20 0130 07/12/20 0515  Weight: 86.2 kg 92.5 kg 91 kg    Examination: Pt alert, chronically ill appearing No jvd Oropharynx clear,  mucosa nl Neck supple Clear to  auscultation, no wheezes or crackles RRR, freqent PACs Mild pitting edema Abd soft nontender Extr warm with no clubbing noted/ L shoulder dressing clean  Neuro  AOx3, normal speech, no focal asymmetry  I personally reviewed images and   impression as follows:  CXR:   11/28  Portable  Cardiomegaly and pulmonary edema   Resolved Hospital Problem list   Septic shock NAGMA Respiratory failure Assessment & Plan:   Septic arthritis of left shoulder and  strep bacteremia - changed to high dose PCN  By ID 11/27  -orthopedic surgery consulted - s/p shoulder aspiration 11/25. Drain in place.  - off pressors however still hypotensive with potential to decompensate. Unable to tolerate BP meds. Continues to require ICU.   NSTEMI, likely demand related to sepsis CAD with HFpEF, circumflex artery is not re-vascularized - Has a statin allergy - Con't aspirin  brilinta 11/26 and adjust hep to low therapeutic since req'd transfusion 11/27  -Holding heart failure meds for now given hypotension>>>  restart coreg first, then Imdur and Entresto when able.   -Cardiology consult-[facility] 11/26  -Serial troponins trending down  - will discontinue heparin drip.  - discontinue morphine for chest pain.   Acute hypoxic respiratory failure due to acute pulmonary edema - Supplemental oxygen as required to maintain SPO2 greater than 90% - on 2LNC satting 100%  Acute urinary retention - likely secondary to held flomax - foley placed 11/28 - resume flomax, discontinue foley  Hypervolemic hyponatremia; TSH and cortisol WNL-volume resuscitated at admission -Continue to monitor  Chronic right lower extremity edema -Doppler ultrasound 11/26 neg  Acute metabolic encephalopathy secondary to sepsis--improving Hx of chronic joint pain, restless leg syndrome, diabetic neuropathy -Continue to hold outpt wellbutrin, celebrex, klonopin, duragesic patch, neurontin, percocet - am 11/28 seems more approp cognitively though had rough night per nursing  Restless leg syndrome -Con't home Requip  Hx of COPD; no inhalers PTA -Albuterol every 4 hours as needed>>>  reduced dose to 1.25 mg  q4 h prn am 11/28 due to tachycardia   DM type 2, suboptimally controlled with hyperglycemia -Holding OP oral hypoglycemics -Con't SSI PRN -Goal BG 140-180 while admitted to the ICU  Hx of GERD on PPI PTA -protonix changed to po 11/28   Anemia, chronic. Likely due to  chronic disease   -Transfused11/27 pm with approp response in hgb level suggesting no active bleeding    Best practice (evaluated daily)   Diet: clears advance as tolerated to heart healthy diet DVT prophylaxis: IV hep  GI prophylaxis: Protonix Mobility: advance as tolerated will consult Pt/OT last date of multidisciplinary goals of care discussion:  Family and staff present : son in [place]. I offered to call and update but patient declined.  Code Status: Full code Disposition: ICU  The patient is critically ill with multiple organ systems failure and requires high complexity decision making for assessment and support, frequent evaluation and titration of therapies, application of advanced monitoring technologies and extensive interpretation of multiple databases.   Critical Care Time devoted to patient care services described in this note is 45 minutes. This time reflects time of care of this [place] . This critical care time does not reflect separately billable procedures or procedure time, teaching time or supervisory time of PA/NP/Med student/Med Resident etc but could involve care discussion time.  [facility] 07/14/2020 8:58 AM  Pager: [phone] After hours pager: [phone]   Labs   CBC: Recent  Labs  Lab 07/09/20 2105 07/09/20 2105 07/10/20 0253 07/10/20 0253 07/11/20 0425 07/12/20 0422 07/12/20 1704 07/13/20 0603 07/14/20 0420  WBC 11.6*   < > 10.1  --  10.4 9.2  --  11.5* 9.4  NEUTROABS 9.8*  --   --   --  8.1*  --   --   --   --   HGB 10.5*   < > 9.7*   < > 9.1* 7.9* 9.2* 10.2* 9.8*  HCT 32.0*   < > 29.5*   < > 27.7* 23.2* 27.9* 30.8* 29.1*  MCV 88.4   < > 90.8  --  88.5 86.2  --  87.5 86.6  PLT 360   < > 311  --  355 320  --  413* 403*   < > = values in this interval not displayed.    Basic Metabolic Panel: Recent Labs  Lab 07/11/20 0425 07/12/20 0422 07/12/20 1704 07/13/20 0603  07/14/20 0420  NA 129* 131* 130* 131* 134*  K 4.3 3.7 3.8 3.6 3.6  CL 100 98 97* 97* 96*  CO2 19* 24 22 23 28   GLUCOSE 143* 131* 181* 175* 165*  BUN 13 17 22 23 22   CREATININE 0.60* 0.58* 0.78 0.73 0.66  CALCIUM 8.1* 7.9* 8.1* 8.3* 8.6*  MG 1.8 1.9  --   --   --    GFR: Estimated Creatinine Clearance: 88.9 mL/min (by C-G formula based on SCr of 0.66 mg/dL). Recent Labs  Lab 07/09/20 2105 07/09/20 2326 07/10/20 0253 07/11/20 0425 07/12/20 0422 07/13/20 0603 07/14/20 0420  WBC 11.6*  --    < > 10.4 9.2 11.5* 9.4  LATICACIDVEN 1.6 1.2  --   --   --   --   --    < > = values in this interval not displayed.    Liver Function Tests: Recent Labs  Lab 07/09/20 2105 07/10/20 0253 07/11/20 0425  AST 19 15 20   ALT 16 13 13   ALKPHOS 125 106 113  BILITOT 0.8 0.6 1.0  PROT 7.8 6.2* 6.4*  ALBUMIN 3.2* 2.6* 2.6*   No results for input(s): LIPASE, AMYLASE in the last 168 hours. No results for input(s): AMMONIA in the last 168 hours.  ABG    Component Value Date/Time   PHART 7.317 (L) 09/22/2015 1908   PCO2ART 44.4 09/22/2015 1908   PO2ART 81.0 09/22/2015 1908   HCO3 22.8 09/22/2015 1908   TCO2 22 05/07/2020 1444   ACIDBASEDEF 3.0 (H) 09/22/2015 1908   O2SAT 95.0 09/22/2015 1908     Coagulation Profile: Recent Labs  Lab 07/09/20 2105  INR 1.3*    Cardiac Enzymes: No results for input(s): CKTOTAL, CKMB, CKMBINDEX, TROPONINI in the last 168 hours.  HbA1C: Hemoglobin A1C  Date/Time Value Ref Range Status  12/01/2017 12:00 AM 6.6  Final  07/28/2017 12:00 AM 6.5  Final   Hgb A1c MFr Bld  Date/Time Value Ref Range Status  05/12/2020 06:22 PM 7.2 (H) 4.8 - 5.6 % Final    Comment:    (NOTE) Pre diabetes:          5.7%-6.4%  Diabetes:              >6.4%  Glycemic control for   <7.0% adults with diabetes   06/28/2018 03:25 PM 6.5 (H) <5.7 % of total Hgb Final    Comment:    For someone without known diabetes, a hemoglobin A1c value of 6.5% or greater  indicates that they may have  diabetes and this should be confirmed with a follow-up  test. . For someone with known diabetes, a value <7% indicates  that their diabetes is well controlled and a value  greater than or equal to 7% indicates suboptimal  control. A1c targets should be individualized based on  duration of diabetes, age, comorbid conditions, and  other considerations. . Currently, no consensus exists regarding use of hemoglobin A1c for diagnosis of diabetes for children. .     CBG: Recent Labs  Lab 07/13/20 1641 07/13/20 1935 07/14/20 0020 07/14/20 0400 07/14/20 0816  GLUCAP 154* 213* 165* 151* 168*

## 2020-07-14 NOTE — TOC Initial Note (Addendum)
Transition of Care [facility]) - Initial/Assessment Note    Patient Details  Name: [name] MRN: [id number] Date of Birth: October 05, 1945  Transition of Care [facility]) CM/SW Contact:    Leeroy Cha, RN Phone Number: 07/14/2020, 9:05 AM  Clinical Narrative:                 74 y.o. male who was seen and examined at bedside at approximately 1145pm No events overnight Patient denies any chest pain. S/p blood transfusion, right shoulder arthrocentesis by orthopedics this morning, off of pressor support, started on diuretic therapy and Foley placed. Denies orthopnea, paroxysmal nocturnal dyspnea or lower extremity swelling. Garibaldi at 2l/min, bilateral a.line due to hypotension, 102/65, ib penicillin g 12 million units iv q12hrs,  Rt arthrocentesis on 07/13/20 Plan = SNF Admission Fl2 send out to areas snf for placement Following for progression. Expected Discharge Plan: Home/Self Care Barriers to Discharge: No Barriers Identified   Patient Goals and CMS Choice Patient states their goals for this hospitalization and ongoing recovery are:: to go home CMS Medicare.gov Compare Post Acute Care list provided to:: Patient    Expected Discharge Plan and Services Expected Discharge Plan: Home/Self Care   Discharge Planning Services: CM Consult   Living arrangements for the past 2 months: Single Family Home                                      Prior Living Arrangements/Services Living arrangements for the past 2 months: Single Family Home Lives with:: Self Patient language and need for interpreter reviewed:: Yes Do you feel safe going back to the place where you live?: Yes      Need for Family Participation in Patient Care: Yes (Comment) Care giver support system in place?: Yes (comment)   Criminal Activity/Legal Involvement Pertinent to Current Situation/Hospitalization: No - Comment as needed  Activities of Daily Living Home Assistive Devices/Equipment: Cane (specify quad  or straight), CBG Meter, Walker (specify type) ADL Screening (condition at time of admission) Patient's cognitive ability adequate to safely complete daily activities?: Yes Is the patient deaf or have difficulty hearing?: No Does the patient have difficulty seeing, even when wearing glasses/contacts?: No Does the patient have difficulty concentrating, remembering, or making decisions?: No Patient able to express need for assistance with ADLs?: Yes Does the patient have difficulty dressing or bathing?: Yes Independently performs ADLs?: Yes (appropriate for developmental age) Does the patient have difficulty walking or climbing stairs?: Yes Weakness of Legs: Both Weakness of Arms/Hands: Both  Permission Sought/Granted                  Emotional Assessment Appearance:: Appears stated age Attitude/Demeanor/Rapport: Engaged Affect (typically observed): Calm Orientation: : Oriented to Place, Oriented to Self, Oriented to  Time, Oriented to Situation Alcohol / Substance Use: Not Applicable Psych Involvement: No (comment)  Admission diagnosis:  Septic shock ([place]) [A41.9, R65.21] Severe sepsis ([place]) [A41.9, R65.20] Pyogenic arthritis of left shoulder region, due to unspecified organism [facility]) [M00.9] Patient Active Problem List   Diagnosis Date Noted  . Acute respiratory failure with hypoxemia ([place]) 07/12/2020  . Shortness of breath   . Status post aortobifemoral bypass surgery   . History of carotid endarterectomy   . Type 2 diabetes mellitus with hyperlipidemia ([place])   . Diabetes mellitus type II, non insulin dependent ([place])   . Severe sepsis ([place]) 07/09/2020  . Pressure injury of skin  05/12/2020  . Amputation of right middle finger 05/12/2020  . Long term (current) use of opiate analgesic 05/07/2020  . Pain in left foot 05/07/2020  . Neck pain 05/07/2020  . Chronic systolic congestive heart failure, NYHA class 3 ([place]) 01/08/2020  . Constipation 01/07/2020  . CAD (coronary  artery disease) 01/03/2020  . Hyperlipidemia associated with type 2 diabetes mellitus ([place]) 01/03/2020  . S/P coronary artery stent placement 01/03/2020  . Major depressive disorder, recurrent episode, severe, with psychotic behavior ([place]) 12/29/2019  . Delusional disorder ([place]) 12/29/2019  . NSTEMI (non-ST elevated myocardial infarction) ([place]) 05/19/2019  . MRSA infection 01/18/2019  . Occipital neuralgia 01/18/2019  . Osteomyelitis of left foot ([place]) 01/18/2019  . Type 2 diabetes mellitus with diabetic peripheral angiopathy without gangrene, without long-term current use of insulin ([place]) 01/18/2019  . Acute on chronic diastolic congestive heart failure ([place]) 01/15/2019  . Varus foot deformity, acquired, left 11/28/2018  . Generalized anxiety disorder 07/03/2018  . Restless leg syndrome 07/03/2018  . Paranoia ([place]) 07/03/2018  . PTSD (post-traumatic stress disorder) 05/29/2018  . Abscess of right lower leg   . Severe protein-calorie malnutrition ([place])   . 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 Diabetic polyneuropathy associated with type 2 diabetes mellitus ([place])   . Cellulitis 04/01/2018  . Cellulitis of right lower extremity 04/01/2018  . Tachycardia 04/01/2018  . Hx of CABG 09/22/2015  . Unstable angina ([place]) 09/20/2015  . Chest pain, rule out acute myocardial infarction 09/18/2015  . Diabetes mellitus type 2 with peripheral artery disease ([place]) 09/18/2015  . Hyponatremia 09/18/2015  . Chest pain on exertion 09/18/2015  . Varicose veins of bilateral lower extremities with other complications 39/76/7341  . Occlusion and stenosis of carotid artery without mention of cerebral infarction 10/18/2011  . SINUSITIS, ACUTE 03/13/2010  . BACK PAIN 11/13/2009  . FATIGUE 10/17/2009  . Diabetic neuropathy associated with diabetes mellitus due to underlying condition ([place]) 08/29/2009  . Vitamin D deficiency 08/29/2009  . Hyposmolality and/or  hyponatremia 08/29/2009  . VISUAL ACUITY, DECREASED, LEFT EYE 08/29/2009  . Peripheral artery disease ([place]) 08/29/2009  . SINUSITIS, CHRONIC 08/29/2009  . NEPHROLITHIASIS 08/29/2009  . FIBROMYALGIA 08/29/2009  . BICEPS TENDON RUPTURE, RIGHT 08/11/2009  . DIABETES MELLITUS, TYPE II 08/05/2009  . Hyperlipidemia 08/05/2009  . ANEMIA-NOS 08/05/2009  . ANXIETY 08/05/2009  . DEPRESSION 08/05/2009  . Chronic pain syndrome 08/05/2009  . Essential hypertension 08/05/2009  . GERD 08/05/2009  . BENIGN PROSTATIC HYPERTROPHY 08/05/2009  . [place] DISEASE, LUMBAR 08/05/2009  . Osteoporosis 08/05/2009   PCP:  Doreatha Lew, MD Pharmacy:   [facility], [place] [address] Phone: [phone] Fax: [phone]     Social Determinants of Health (SDOH) Interventions    Readmission Risk Interventions No flowsheet data found.

## 2020-07-14 NOTE — Plan of Care (Signed)
  Problem: Fluid Volume: Goal: Hemodynamic stability will improve Outcome: Progressing   Problem: Clinical Measurements: Goal: Diagnostic test results will improve Outcome: Progressing Goal: Signs and symptoms of infection will decrease Outcome: Progressing   Problem: Respiratory: Goal: Ability to maintain adequate ventilation will improve Outcome: Progressing   Problem: Education: Goal: Knowledge of General Education information will improve Description: Including pain rating scale, medication(s)/side effects and non-pharmacologic comfort measures Outcome: Progressing   Problem: Clinical Measurements: Goal: Ability to maintain clinical measurements within normal limits will improve Outcome: Progressing Goal: Respiratory complications will improve Outcome: Progressing Goal: Cardiovascular complication will be avoided Outcome: Progressing   Problem: Nutrition: Goal: Adequate nutrition will be maintained Outcome: Progressing   Problem: Pain Managment: Goal: General experience of comfort will improve Outcome: Progressing   Problem: Skin Integrity: Goal: Risk for impaired skin integrity will decrease Outcome: Progressing

## 2020-07-14 NOTE — Progress Notes (Signed)
Bach Rocchi  MRN: [id number] DOB/Age: 05/05/1946 74 y.o. Physician: Ander Slade, M.D. 4 Days Post-Op Procedure(s) (LRB): SHOULDER ARTHROSCOPY WITH WASHOUT (Left)  Subjective: Patient is alert and oriented in bed this a.m. during the evaluation.  Appears much more oriented and comfortable compared to his evaluation last Thursday.  Recent events reviewed. Vital Signs Temp:  [98.1 F (36.7 C)-98.8 F (37.1 C)] 98.7 F (37.1 C) (11/29 1220) Pulse Rate:  [90-109] 102 (11/29 1200) Resp:  [16-25] 20 (11/29 1200) BP: (89-154)/(51-75) 154/51 (11/29 1200) SpO2:  [99 %-100 %] 99 % (11/29 1200) Arterial Line BP: (69-102)/(46-65) 70/46 (11/29 1042)  Lab Results Recent Labs    07/13/20 0603 07/14/20 0420  WBC 11.5* 9.4  HGB 10.2* 9.8*  HCT 30.8* 29.1*  PLT 413* 403*   BMET Recent Labs    07/13/20 0603 07/14/20 0420  NA 131* 134*  K 3.6 3.6  CL 97* 96*  CO2 23 28  GLUCOSE 175* 165*  BUN 23 22  CREATININE 0.73 0.66  CALCIUM 8.3* 8.6*   INR  Date Value Ref Range Status  07/09/2020 1.3 (H) 0.8 - 1.2 Final    Comment:    (NOTE) INR goal varies based on device and disease states. Performed at [facility], [place] [address] [place], [place] [address]      Exam  Inspection of the left shoulder demonstrates the previously noted erythema has almost completely resolved.  There is very minimal drainage at the Hemovacs.  These were both removed without difficulty today.  Moderate effusion persists.  However no induration.  Minimal tenderness.  He remains neurovascular intact distally.  Cultures of left shoulder synovial fluid growing group B strep, currently on ceftriaxone.  Recent right shoulder aspiration performed by Dr. Tonita Cong due to findings of shoulder pain and swelling demonstrated rare WBCs, primarily mononuclear, cultures no growth so far.  Review of recent chest x-ray with portions of the right shoulder included demonstrate evidence for significant  degenerative arthrosis and likely underlying chronic rotator cuff tear arthropathy.  Impression:  1.  Left shoulder septic arthritis status post arthroscopic lavage and debridement.  Postoperative drainage has reduced significantly and drains are discontinued today.  Moderate effusion persists as would be anticipated with such severe chronic joint degeneration related to arthrosis and chronic rotator cuff tear arthropathy.  2.  Right shoulder effusion with likely underlying chronic rotator cuff tear arthropathy.  Joint fluid demonstrated rare WBCs, primarily mononuclear, cultures pending, initial evaluation suggest that this is unlikely to represent an acute infection of the right shoulder.  3.  Systemic sepsis with hypotension significantly improved under current treatment algorithm coordinated by critical care and cardiology.  Plan I have reviewed with Mr. Dewing the recent clinical events as well as updated him on status regarding his left shoulder septic arthritis.  We will continue to monitor his progress clinically.  His underlying severe degenerative joint disease and rotator cuff tear arthropathy would predispose to persistent/recurring effusions and have his infection is appropriately treated and the effusions are an ongoing problem we may consider a reaspiration and corticosteroid injection for symptomatic relief.  We will continue to monitor his clinical progress.  Call with any questions.   Giorgi Debruin M Amadu Schlageter 07/14/2020, 1:38 PM   Contact # [phone]

## 2020-07-15 ENCOUNTER — Inpatient Hospital Stay (HOSPITAL_COMMUNITY): Payer: Medicare Other

## 2020-07-15 DIAGNOSIS — T8112XA Postprocedural septic shock, initial encounter: Secondary | ICD-10-CM

## 2020-07-15 DIAGNOSIS — J9600 Acute respiratory failure, unspecified whether with hypoxia or hypercapnia: Secondary | ICD-10-CM

## 2020-07-15 DIAGNOSIS — B951 Streptococcus, group B, as the cause of diseases classified elsewhere: Secondary | ICD-10-CM

## 2020-07-15 DIAGNOSIS — E785 Hyperlipidemia, unspecified: Secondary | ICD-10-CM

## 2020-07-15 DIAGNOSIS — T8144XA Sepsis following a procedure, initial encounter: Secondary | ICD-10-CM

## 2020-07-15 DIAGNOSIS — J9601 Acute respiratory failure with hypoxia: Secondary | ICD-10-CM | POA: Diagnosis not present

## 2020-07-15 DIAGNOSIS — I2510 Atherosclerotic heart disease of native coronary artery without angina pectoris: Secondary | ICD-10-CM | POA: Diagnosis not present

## 2020-07-15 DIAGNOSIS — Z951 Presence of aortocoronary bypass graft: Secondary | ICD-10-CM | POA: Diagnosis not present

## 2020-07-15 DIAGNOSIS — A401 Sepsis due to streptococcus, group B: Secondary | ICD-10-CM

## 2020-07-15 DIAGNOSIS — I214 Non-ST elevation (NSTEMI) myocardial infarction: Secondary | ICD-10-CM | POA: Diagnosis not present

## 2020-07-15 DIAGNOSIS — T8142XA Infection following a procedure, deep incisional surgical site, initial encounter: Secondary | ICD-10-CM

## 2020-07-15 DIAGNOSIS — E1169 Type 2 diabetes mellitus with other specified complication: Secondary | ICD-10-CM

## 2020-07-15 LAB — GLUCOSE, CAPILLARY
Glucose-Capillary: 101 mg/dL — ABNORMAL HIGH (ref 70–99)
Glucose-Capillary: 129 mg/dL — ABNORMAL HIGH (ref 70–99)
Glucose-Capillary: 165 mg/dL — ABNORMAL HIGH (ref 70–99)
Glucose-Capillary: 173 mg/dL — ABNORMAL HIGH (ref 70–99)
Glucose-Capillary: 169 mg/dL — ABNORMAL HIGH (ref 70–99)

## 2020-07-15 LAB — CULTURE, BLOOD (ROUTINE X 2): Culture: NO GROWTH

## 2020-07-15 LAB — TROPONIN I (HIGH SENSITIVITY)
Troponin I (High Sensitivity): 344 ng/L (ref <18)
Troponin I (High Sensitivity): 364 ng/L (ref <18)

## 2020-07-15 MED ORDER — ASPIRIN EC 81 MG PO TBEC: 81.0000 mg | DELAYED_RELEASE_TABLET | Freq: Every day | ORAL | 0 refills | Status: AC

## 2020-07-15 MED ORDER — CARVEDILOL 6.25 MG PO TABS
6.2500 mg | ORAL_TABLET | Freq: Two times a day (BID) | ORAL | Status: DC
  Administered 2020-07-15 – 2020-07-18 (×5): 6.25 mg via ORAL
  Filled 2020-07-15 (×7): qty 1

## 2020-07-15 MED ORDER — BUPROPION HCL ER (XL) 300 MG PO TB24
300.0000 mg | ORAL_TABLET | Freq: Every day | ORAL | Status: DC
  Administered 2020-07-15 – 2020-07-22 (×8): 300 mg via ORAL
  Filled 2020-07-15 (×8): qty 1

## 2020-07-15 MED ORDER — GABAPENTIN 400 MG PO CAPS
800.0000 mg | ORAL_CAPSULE | Freq: Every day | ORAL | Status: DC
  Administered 2020-07-15 – 2020-07-21 (×7): 800 mg via ORAL
  Filled 2020-07-15 (×7): qty 2

## 2020-07-15 MED ORDER — GABAPENTIN 400 MG PO CAPS
400.0000 mg | ORAL_CAPSULE | Freq: Every day | ORAL | Status: DC
  Administered 2020-07-15 – 2020-07-22 (×8): 400 mg via ORAL
  Filled 2020-07-15 (×8): qty 1

## 2020-07-15 NOTE — Progress Notes (Signed)
CRITICAL VALUE ALERT  Critical Value:  Troponin 344   Date & Time Notied:  07/15/2020 1603  Provider Notified: Georgann Housekeeper, NP  Orders Received/Actions taken: Awaiting new orders, will continue to monitor

## 2020-07-15 NOTE — TOC Progression Note (Addendum)
Transition of Care [facility]) - Progression Note    Patient Details  Name: [name] MRN: [id number] Date of Birth: [birth year]  Transition of Care [facility]) CM/SW Contact  Leeroy Cha, RN Phone Number: 07/15/2020, 9:33 AM  Clinical Narrative:    Accepted SNF's: [place] Patient not ready for placement at this time/ Patient now is refusing snf placement states he has a live in caregiver.  Expected Discharge Plan: [place] Barriers to Discharge: No Barriers Identified  Expected Discharge Plan and Services Expected Discharge Plan: [place]   Discharge Planning Services: CM Consult   Living arrangements for the past 2 months: Single Family Home                                       Social Determinants of Health (SDOH) Interventions    Readmission Risk Interventions No flowsheet data found.

## 2020-07-15 NOTE — Progress Notes (Signed)
OT Cancellation Note  Patient Details Name: [name] MRN: [id number] DOB: 1946/01/06   Cancelled Treatment:    Reason Eval/Treat Not Completed: Patient at procedure or test/ unavailable. Patient getting MRI of R shoulder, will check back 12/1 as schedule permits.  Delbert Phenix OT OT pager: [phone] 07/15/2020, 1:49 PM

## 2020-07-15 NOTE — Progress Notes (Signed)
Failed attempt at MRI. Patient terminated the procedure due to pain and refused further imaging. Four members of the MRI staff were present when patient declined to continue with exam.

## 2020-07-15 NOTE — Progress Notes (Signed)
2nd bladder scan shows greater than 566 ML in bladder. Patient is still refusing I&O cath. Education given on urinary retention and why the cath is needed.Patient states  " I am frustrated and just wants to rest", and "just stop, get out and leave me alone". He also stated "I can pee on my own and it will come out on its own". A change in mental status is noted from the first two assessments, as he was initially alert, oriented, and cooperative. Will attempt another scan in 2 hours if patient still hasn't voided. Charge nurse updated.

## 2020-07-15 NOTE — Progress Notes (Signed)
I&O cath completed, 900 ml of clear, amber urine out. Patient calm and cooperative, tolerated cath well.

## 2020-07-15 NOTE — Progress Notes (Addendum)
**Note Anthony-Identified via Obfuscation** Subjective: He has really limited ROM and tenderness in right shoulder   Antibiotics:  Anti-infectives (From admission, onward)   Start     Dose/Rate Route Frequency Ordered Stop   07/12/20 1800  penicillin G potassium 12 Million Units in dextrose 5 % 500 mL continuous infusion        12 Million Units 41.7 mL/hr over 12 Hours Intravenous Every 12 hours 07/12/20 1100     07/11/20 1800  cefTRIAXone (ROCEPHIN) 2 g in sodium chloride 0.9 % 100 mL IVPB  Status:  Discontinued        2 g 200 mL/hr over 30 Minutes Intravenous Every 24 hours 07/11/20 1235 07/12/20 1100   07/10/20 2200  cefTRIAXone (ROCEPHIN) 2 g in sodium chloride 0.9 % 100 mL IVPB  Status:  Discontinued        2 g 200 mL/hr over 30 Minutes Intravenous Every 24 hours 07/09/20 2347 07/10/20 0926   07/10/20 1000  vancomycin (VANCOCIN) IVPB 1000 mg/200 mL premix  Status:  Discontinued        1,000 mg 200 mL/hr over 60 Minutes Intravenous Every 12 hours 07/10/20 0033 07/11/20 1235   07/10/20 1000  ceFEPIme (MAXIPIME) 2 g in sodium chloride 0.9 % 100 mL IVPB  Status:  Discontinued        2 g 200 mL/hr over 30 Minutes Intravenous Every 8 hours 07/10/20 0939 07/11/20 1235   07/09/20 2100  vancomycin (VANCOREADY) IVPB 1750 mg/350 mL        1,750 mg 175 mL/hr over 120 Minutes Intravenous STAT 07/09/20 2045 07/09/20 2333   07/09/20 2045  vancomycin (VANCOCIN) IVPB 1000 mg/200 mL premix  Status:  Discontinued        1,000 mg 200 mL/hr over 60 Minutes Intravenous  Once 07/09/20 2041 07/09/20 2045   07/09/20 2045  cefTRIAXone (ROCEPHIN) 2 g in sodium chloride 0.9 % 100 mL IVPB        2 g 200 mL/hr over 30 Minutes Intravenous  Once 07/09/20 2041 07/09/20 2209      Medications: Scheduled Meds: . aspirin EC  81 mg Oral Daily  . buPROPion  300 mg Oral Daily  . carvedilol  6.25 mg Oral BID WC  . Chlorhexidine Gluconate Cloth  6 each Topical Daily  . docusate sodium  100 mg Oral BID  . enoxaparin (LOVENOX) injection  40 mg  Subcutaneous Q24H  . gabapentin  400 mg Oral Daily  . gabapentin  800 mg Oral QHS  . insulin aspart  0-15 Units Subcutaneous Q4H  . mouth rinse  15 mL Mouth Rinse q12n4p  . mouth rinse  15 mL Mouth Rinse BID  . pantoprazole  40 mg Oral Daily  . rOPINIRole  1 mg Oral TID  . tamsulosin  0.4 mg Oral Daily  . ticagrelor  90 mg Oral BID   Continuous Infusions: . sodium chloride Stopped (07/10/20 0748)  . penicillin g continuous IV infusion 12 Million Units (07/15/20 0911)   PRN Meds:.acetaminophen, albuterol, bisacodyl, guaiFENesin, magnesium citrate, menthol-cetylpyridinium **OR** phenol, metoCLOPramide **OR** metoCLOPramide (REGLAN) injection, ondansetron **OR** ondansetron (ZOFRAN) IV, oxyCODONE, polyethylene glycol    Objective: Weight change:   Intake/Output Summary (Last 24 hours) at 07/15/2020 1046 Last data filed at 07/15/2020 0911 Gross per 24 hour  Intake 1620.88 ml  Output 1200 ml  Net 420.88 ml   Blood pressure (!) 130/54, pulse 74, temperature 98 F (36.7 C), temperature source Oral, resp. rate 12, height 6' (1.829 m),  weight 91 kg, SpO2 100 %. Temp:  [97.9 F (36.6 C)-99.1 F (37.3 C)] 98 F (36.7 C) (11/30 0800) Pulse Rate:  [65-102] 74 (11/30 0900) Resp:  [12-23] 12 (11/30 0900) BP: (111-154)/(23-123) 130/54 (11/30 0900) SpO2:  [95 %-100 %] 100 % (11/30 0900)  Physical Exam: General: Alert and awake, oriented x3 bandage HEENT: anicteric sclera, EOMI CVS regular rate, normal  Chest: , no wheezing, no respiratory distress Abdomen: soft non-distended,  Extremities: left shoulder with dressing, right shoulder is tender to palpation and limited range of motion even passively elevating his arm induces pain in the shoulder joint Skin: no rashes Neuro: nonfocal  CBC:    BMET Recent Labs    07/13/20 0603 07/14/20 0420  NA 131* 134*  K 3.6 3.6  CL 97* 96*  CO2 23 28  GLUCOSE 175* 165*  BUN 23 22  CREATININE 0.73 0.66  CALCIUM 8.3* 8.6*     Liver  Panel  Recent Labs    07/14/20 0420  AST 67*  ALT 34       Sedimentation Rate No results for input(s): ESRSEDRATE in the last 72 hours. C-Reactive Protein No results for input(s): CRP in the last 72 hours.  Micro Results: Recent Results (from the past 720 hour(s))  Anaerobic and Aerobic Culture     Status: None   Collection Time: 06/24/20 11:33 AM   Specimen: Joint, Left Shoulder; Synovial Fluid  Result Value Ref Range Status   MICRO NUMBER: [id number]  Final   SPECIMEN QUALITY: Adequate  Final   Source: FLUID, SYNOVIAL  Final   STATUS: FINAL  Final   GRAM STAIN:   Final    Many Polymorphonuclear leukocytes No organisms seen   ANA RESULT: No anaerobes isolated.  Final   MICRO NUMBER: [id number]  Final   SPECIMEN QUALITY: Adequate  Final   SOURCE: FLUID, SYNOVIAL  Final   STATUS: FINAL  Final   AER RESULT: No Growth  Final  Blood Culture (routine x 2)     Status: None   Collection Time: 07/09/20  9:05 PM   Specimen: BLOOD LEFT FOREARM  Result Value Ref Range Status   Specimen Description   Final    BLOOD LEFT FOREARM Performed at [facility] [address] [place], [place] [address]    Special Requests   Final    BOTTLES DRAWN AEROBIC AND ANAEROBIC Blood Culture results may not be optimal due to an excessive volume of blood received in culture bottles Performed at [facility] [address] [place], [place] [address]    Culture   Final    NO GROWTH 5 DAYS Performed at [facility], [place] [address], [place], [place] [address]    Report Status 07/15/2020 FINAL  Final  Blood Culture (routine x 2)     Status: Abnormal   Collection Time: 07/09/20  9:05 PM   Specimen: BLOOD RIGHT FOREARM  Result Value Ref Range Status   Specimen Description   Final    BLOOD RIGHT FOREARM Performed at [facility] [address], [place], [place] [address]    Special Requests   Final    BOTTLES DRAWN AEROBIC AND  ANAEROBIC Blood Culture adequate volume Performed at [facility] [address], [place], [place] [address]    Culture  Setup Time   Final    GRAM POSITIVE COCCI IN CHAINS IN BOTH AEROBIC AND ANAEROBIC BOTTLES CRITICAL RESULT CALLED TO, READ BACK  BY AND VERIFIED WITHDomenick Gong [facility] 5027 07/10/20 A BROWNING Performed at [facility], [place] [address] [place], [place] [address]    Culture GROUP B STREP(S.AGALACTIAE)ISOLATED (A)  Final   Report Status 07/12/2020 FINAL  Final   Organism ID, Bacteria GROUP B STREP(S.AGALACTIAE)ISOLATED  Final      Susceptibility   Group b strep(s.agalactiae)isolated - MIC*    CLINDAMYCIN >=1 RESISTANT Resistant     AMPICILLIN <=0.25 SENSITIVE Sensitive     ERYTHROMYCIN >=8 RESISTANT Resistant     VANCOMYCIN 0.5 SENSITIVE Sensitive     CEFTRIAXONE <=0.12 SENSITIVE Sensitive     LEVOFLOXACIN 0.5 SENSITIVE Sensitive     PENICILLIN Value in next row Sensitive      SENSITIVE0.06    * GROUP B STREP(S.AGALACTIAE)ISOLATED  Blood Culture ID Panel (Reflexed)     Status: Abnormal   Collection Time: 07/09/20  9:05 PM  Result Value Ref Range Status   Enterococcus faecalis NOT DETECTED NOT DETECTED Final   Enterococcus Faecium NOT DETECTED NOT DETECTED Final   Listeria monocytogenes NOT DETECTED NOT DETECTED Final   Staphylococcus species NOT DETECTED NOT DETECTED Final   Staphylococcus aureus (BCID) NOT DETECTED NOT DETECTED Final   Staphylococcus epidermidis NOT DETECTED NOT DETECTED Final   Staphylococcus lugdunensis NOT DETECTED NOT DETECTED Final   Streptococcus species DETECTED (A) NOT DETECTED Final    Comment: CRITICAL RESULT CALLED TO, READ BACK BY AND VERIFIED WITH: S CHRISTY PHARMD 1631 07/10/20 A BROWNING    Streptococcus agalactiae DETECTED (A) NOT DETECTED Final    Comment: CRITICAL RESULT CALLED TO, READ BACK BY AND VERIFIED WITH: S CHRISTY PHARMD 1631 07/10/20 A BROWNING    Streptococcus pneumoniae NOT DETECTED NOT  DETECTED Final   Streptococcus pyogenes NOT DETECTED NOT DETECTED Final   A.calcoaceticus-baumannii NOT DETECTED NOT DETECTED Final   Bacteroides fragilis NOT DETECTED NOT DETECTED Final   Enterobacterales NOT DETECTED NOT DETECTED Final   Enterobacter cloacae complex NOT DETECTED NOT DETECTED Final   Escherichia coli NOT DETECTED NOT DETECTED Final   Klebsiella aerogenes NOT DETECTED NOT DETECTED Final   Klebsiella oxytoca NOT DETECTED NOT DETECTED Final   Klebsiella pneumoniae NOT DETECTED NOT DETECTED Final   Proteus species NOT DETECTED NOT DETECTED Final   Salmonella species NOT DETECTED NOT DETECTED Final   Serratia marcescens NOT DETECTED NOT DETECTED Final   Haemophilus influenzae NOT DETECTED NOT DETECTED Final   Neisseria meningitidis NOT DETECTED NOT DETECTED Final   Pseudomonas aeruginosa NOT DETECTED NOT DETECTED Final   Stenotrophomonas maltophilia NOT DETECTED NOT DETECTED Final   Candida albicans NOT DETECTED NOT DETECTED Final   Candida auris NOT DETECTED NOT DETECTED Final   Candida glabrata NOT DETECTED NOT DETECTED Final   Candida krusei NOT DETECTED NOT DETECTED Final   Candida parapsilosis NOT DETECTED NOT DETECTED Final   Candida tropicalis NOT DETECTED NOT DETECTED Final   Cryptococcus neoformans/gattii NOT DETECTED NOT DETECTED Final    Comment: Performed at [facility], [place] [address] [place], [place] [address]  Resp Panel by RT-PCR (Flu A&B, Covid) Nasopharyngeal Swab     Status: None   Collection Time: 07/09/20  9:06 PM   Specimen: Nasopharyngeal Swab; Nasopharyngeal(NP) swabs in vial transport medium  Result Value Ref Range Status   SARS Coronavirus 2 by RT PCR NEGATIVE NEGATIVE Final    Comment: (NOTE) SARS-CoV-2 target nucleic acids are NOT DETECTED.  The SARS-CoV-2 RNA is generally detectable in upper respiratory specimens during the acute phase of  infection. The lowest concentration of SARS-CoV-2 viral copies this assay can detect is 138  copies/mL. A negative result does not preclude SARS-Cov-2 infection and should not be used as the sole basis for treatment or other patient management decisions. A negative result may occur with  improper specimen collection/handling, submission of specimen other than nasopharyngeal swab, presence of viral mutation(s) within the areas targeted by this assay, and inadequate number of viral copies(<138 copies/mL). A negative result must be combined with clinical observations, patient history, and epidemiological information. The expected result is Negative.  Fact Sheet for Patients:  EntrepreneurPulse.com.au  Fact Sheet for Healthcare Providers:  IncredibleEmployment.be  This test is no t yet approved or cleared by the [place] FDA and  has been authorized for detection and/or diagnosis of SARS-CoV-2 by FDA under an Emergency Use Authorization (EUA). This EUA will remain  in effect (meaning this test can be used) for the duration of the COVID-19 declaration under Section 564(b)(1) of the Act, 21 U.S.C.section 360bbb-3(b)(1), unless the authorization is terminated  or revoked sooner.       Influenza A by PCR NEGATIVE NEGATIVE Final   Influenza B by PCR NEGATIVE NEGATIVE Final    Comment: (NOTE) The Xpert Xpress SARS-CoV-2/FLU/RSV plus assay is intended as an aid in the diagnosis of influenza from Nasopharyngeal swab specimens and should not be used as a sole basis for treatment. Nasal washings and aspirates are unacceptable for Xpert Xpress SARS-CoV-2/FLU/RSV testing.  Fact Sheet for Patients: EntrepreneurPulse.com.au  Fact Sheet for Healthcare Providers: IncredibleEmployment.be  This test is not yet approved or cleared by the [place] FDA and has been authorized for detection and/or diagnosis of SARS-CoV-2 by FDA under an Emergency Use Authorization (EUA). This EUA will remain in effect (meaning  this test can be used) for the duration of the COVID-19 declaration under Section 564(b)(1) of the Act, 21 U.S.C. section 360bbb-3(b)(1), unless the authorization is terminated or revoked.  Performed at [facility], [place] [address] [place], [place] [address]   Body fluid culture     Status: None   Collection Time: 07/09/20 10:00 PM   Specimen: Synovium; Body Fluid  Result Value Ref Range Status   Specimen Description   Final    SYNOVIAL LEFT SHOULDER Performed at [facility] [address], [place], [place] [address]    Special Requests   Final    NONE Performed at [facility], [place] [address] [place], [place] [address]    Gram Stain   Final    MODERATE WBC PRESENT, PREDOMINANTLY PMN NO ORGANISMS SEEN RESULT CALLED TO, READ BACK BY AND VERIFIED WITH: RN J DAVISON AT 2251 07/09/20 CRUICKSHANK A Performed at [facility], [place] [address], [place], [place] [address]    Culture   Final    RARE GROUP B STREP(S.AGALACTIAE)ISOLATED TESTING AGAINST S. AGALACTIAE NOT ROUTINELY PERFORMED DUE TO PREDICTABILITY OF AMP/PEN/VAN SUSCEPTIBILITY. CRITICAL RESULT CALLED TO, READ BACK BY AND VERIFIED WITH: A. MANSFIELD RN, AT 0737 07/11/20 BY D. VANHOOK REGARDING GROWTH Performed at [facility], [place] [address], [place], [place] [address]    Report Status 07/12/2020 FINAL  Final  Urine culture     Status: Abnormal   Collection Time: 07/10/20  1:00 AM   Specimen: In/Out Cath Urine  Result Value Ref Range Status   Specimen Description   Final    IN/OUT CATH URINE Performed at [facility] [address] [place], [place]  [address]    Special Requests   Final    NONE Performed at [facility], [place] [address], [place], [place] [address]    Culture (A)  Final    10,000 COLONIES/mL PSEUDOMONAS AERUGINOSA 10,000 COLONIES/mL STREPTOCOCCUS AGALACTIAE TESTING AGAINST S.  AGALACTIAE NOT ROUTINELY PERFORMED DUE TO PREDICTABILITY OF AMP/PEN/VAN SUSCEPTIBILITY. Performed at [facility], [place] [address] [place], [place] [address]    Report Status 07/12/2020 FINAL  Final   Organism ID, Bacteria PSEUDOMONAS AERUGINOSA (A)  Final      Susceptibility   Pseudomonas aeruginosa - MIC*    CEFTAZIDIME 2 SENSITIVE Sensitive     CIPROFLOXACIN <=0.25 SENSITIVE Sensitive     GENTAMICIN <=1 SENSITIVE Sensitive     IMIPENEM 2 SENSITIVE Sensitive     PIP/TAZO <=4 SENSITIVE Sensitive     CEFEPIME 2 SENSITIVE Sensitive     * 10,000 COLONIES/mL PSEUDOMONAS AERUGINOSA  MRSA PCR Screening     Status: None   Collection Time: 07/10/20  1:15 AM   Specimen: Nasopharyngeal  Result Value Ref Range Status   MRSA by PCR NEGATIVE NEGATIVE Final    Comment:        The GeneXpert MRSA Assay (FDA approved for NASAL specimens only), is one component of a comprehensive MRSA colonization surveillance program. It is not intended to diagnose MRSA infection nor to guide or monitor treatment for MRSA infections. Performed at [facility], [place] [address] [place], [place] [address]   Culture, blood (routine x 2)     Status: None (Preliminary result)   Collection Time: 07/11/20  9:55 AM   Specimen: BLOOD  Result Value Ref Range Status   Specimen Description   Final    BLOOD LEFT ANTECUBITAL Performed at [facility] [address], [place], [place] [address]    Special Requests   Final    BOTTLES DRAWN AEROBIC ONLY Blood Culture adequate volume Performed at [facility] [address] [place], [place] [address]    Culture   Final    NO GROWTH 4 DAYS Performed at [facility], [place] [address] [place], [place] [address]    Report Status PENDING  Incomplete  Culture, blood (routine x 2)     Status: None (Preliminary result)   Collection Time: 07/11/20 10:01 AM   Specimen: BLOOD LEFT HAND  Result Value Ref Range  Status   Specimen Description   Final    BLOOD LEFT HAND Performed at [facility] [address], [place], [place] [address]    Special Requests   Final    BOTTLES DRAWN AEROBIC ONLY Blood Culture adequate volume Performed at [facility] [address], [place], [place] [address]    Culture   Final    NO GROWTH 4 DAYS Performed at [facility], [place] [address], [place], [place] [address]    Report Status PENDING  Incomplete  Body fluid culture     Status: None (Preliminary result)   Collection Time: 07/13/20  8:45 AM   Specimen: Joint, Right Shoulder; Synovial Fluid  Result Value Ref Range Status   Specimen Description   Final    JOINT FLUID Performed at [facility], [place] [address], [place], [place] [address]    Special Requests   Final    RIGHT SHOULDER Performed at [facility] [address], [place], [place] [address]    Gram Stain  Final    RARE WBC PRESENT, PREDOMINANTLY MONONUCLEAR NO ORGANISMS SEEN    Culture   Final    NO GROWTH < 24 HOURS Performed at [facility] [address], [place], [place] [address]    Report Status PENDING  Incomplete    Studies/Results: [place] EKG SITE RITE  Result Date: 07/14/2020 If Site Rite image not attached, placement could not be confirmed due to current cardiac rhythm.     Assessment/Plan:  INTERVAL HISTORY: repeat blood cultures are sterile   Active Problems:   Peripheral artery disease (HCC)   Hx of CABG   Septic shock (HCC)   Shortness of breath   Status post aortobifemoral bypass surgery   History of carotid endarterectomy   Type 2 diabetes mellitus with hyperlipidemia (HCC)   Diabetes mellitus type II, non insulin dependent (HCC)   Acute respiratory failure (HCC)   Pyogenic arthritis of left shoulder region [facility])   Acute pain of right shoulder    [name] is a 74 y.o. male with Group B streptococcal  bacteremia and septic shock due to left septic shoulder sp I and D. He has had multiple shoulder aspirations with elevated WBC in them. He also had a right shoulder aspirate performed yesterday with 16900 WBC, 97% PMNs.   While the latter cell count is not typical for infected shoulder the patient had been on IV antibiotics for 4 days prior to joint aspirate and for comparison the PROVEN infected shoulder only had TWICE amount of cells than right shoulder AND I do not have an explanation for his abnormal cell count  I am ordering MRI to get better definition of right shoulder  If the MRI does suggest infection given the uncertainty as to why he has an abnormal cell count here and given his bacteremia and known left septic shoulder I would err on the side of surgery on the right as well    In the interim we will plan on 6 weeks of parenteral IV antibiotics (IV PCN)  If he has right shoulder surgery will need to extend stop date  I put in for radiology to place central line given the fact that he may have infection in bilateral shoulders and picc not optimal  Diagnosis: PJI  Culture Result: Group B strep  Allergies  Allergen Reactions  . Ivp Dye [Iodinated Diagnostic Agents]     Blood Pressure dropped----pt was pre-medicated with 13 hour prep and did fine with pre-meds--amy 03/09/11   . Penicillins Nausea Only    REACTION: stomach pain(IV ok) Has patient had a PCN reaction causing immediate rash, facial/tongue/throat swelling, SOB or lightheadedness with hypotension: No Has patient had a PCN reaction causing severe rash involving mucus membranes or skin necrosis: No Has patient had a PCN reaction that required hospitalization: No Has patient had a PCN reaction occurring within the last 10 years: No If all of the above answers are "NO", then may proceed with Cephalosporin use.   . Sulfa Antibiotics Hives  . Sulfonamide Derivatives Hives and Itching  . Lisinopril Cough  . Metoprolol  Nausea And Vomiting  . Statins Other (See Comments)    myalgias  . Propoxyphene N-Acetaminophen Other (See Comments)    Sharp pains- headache    OPAT Orders Discharge antibiotics:  Penicillin   Duration:  6 weeks End Date:  January 6th, 2022  Central line Care Per Protocol:    Labs  weekly while on IV antibiotics: _x_ CBC with differential _x_ BMP  w GFR/CMP _x_ CRP _x_ ESR    _x_ will need RADIOLOGY TO PULL Central line at completion of IV antibiotics __ Please leave PIC in place until doctor has seen patient or been notified  Fax weekly labs to [phone]  Clinic Follow Up Appt:   [name] has an appointment on 08/21/2020 with Dr [name] at [facility] is located in the [facility] at  [place] in [place].  [address], which is located to the left of the elevators.  Phone: [phone]  Fax: [phone]  [email or web address]  He should arrive 15 minutes prior to his appt.        LOS: 6 days   Alcide Evener 07/15/2020, 10:46 AM

## 2020-07-15 NOTE — Progress Notes (Signed)
Patient has not urinated since 07/14/20 at 1500 when the foley catheter was removed. Bladder scan shows greater than 400 ml residual. In and out cath is required per hospital policy, patient is refusing cath at this time. Notified charge RN, will scan again in 2 hours and continue to monitor.

## 2020-07-15 NOTE — Consult Note (Signed)
Chief Complaint: Septic shoulder(s). Request is for tunneled central catheter  Referring Physician(s): Dr. [name]  Supervising Physician: Corrie Mckusick  Patient Status: [facility] - In-pt  History of Present Illness: [name] is a 74 y.o. male History of DM, PAD, CAD s/p CABG. Presented tot he ED with weakness and fever. The patient was found to be in septic shock  (group B streptococcal bacteremia) due to a septic left shoulder s/p I & D and washout on 11.25.21. Team is concern for a septic right shoulder due to limited ROM and tenderness. Team is requesting a tunneled central catheter for ongoing antibiotic administration.    Past Medical History:  Diagnosis Date  . Anemia    NOS  . Anxiety   . 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 BPH (benign prostatic hypertrophy)   . Broken foot Oct. 12, 2013   Right foot Fx  . CAD (coronary artery disease)   . Carotid stenosis, left   . Cellulitis and abscess of leg 03/2018   right leg  . Chronic hyponatremia   . Chronic pain syndrome   . COPD (chronic obstructive pulmonary disease) ([place])   . Depression   . Diabetes mellitus   . Diabetic foot ulcer ([address])   . DJD (degenerative joint disease)    L wrist  . DM2 (diabetes mellitus, type 2) ([place])   . GERD (gastroesophageal reflux disease)   . HLD (hyperlipidemia)   . HTN (hypertension)   . Hx of blood transfusion reaction   . Hyponatremia   . Lumbar disc disease   . MI (mitral incompetence)   . Myocardial infarction ([place])    x 2  . Osteoporosis    tx per [facility], Dr Prudencio Burly, thought due to heavy steriod use after 1978  . Peripheral neuropathy   . Peripheral vascular disease ([place])   . Restless leg syndrome   . Spine fracture    hx, multiple  . Varicose veins   . 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 Vitamin D deficiency     Past Surgical History:  Procedure Laterality Date  . ANGIOPLASTY    . aorta bifemoral bypass grafting  09/2010   . CARDIAC CATHETERIZATION N/A 09/19/2015   Procedure: Left Heart Cath and Coronary Angiography;  Surgeon: Adrian Prows, MD;  Location: [place] [facility];  Service: Cardiovascular;  Laterality: N/A;  . CARPAL TUNNEL RELEASE     right 2006/ left 2007  . CATARACT EXTRACTION     left 1996/ right 1997  . CORONARY ARTERY BYPASS GRAFT N/A 09/22/2015   Procedure: CORONARY ARTERY BYPASS GRAFTING (CABG) times four using the right greater saphenous vein harvested endoscopically and the left internal mammary artery.  LIMA-LAD, SEQ SVG-DIAG & OM, SVG-PD.;  Surgeon: [name], MD;  Location: [place];  Service: Open Heart Surgery;  Laterality: N/A;  . I & D EXTREMITY Right 04/07/2018   Procedure: IRRIGATION AND DEBRIDEMENT ABSCESS RIGHT LEG;  Surgeon: Newt Minion, MD;  Location: [place];  Service: Orthopedics;  Laterality: Right;  . INCISION AND DRAINAGE OF WOUND Right 05/07/2020   Procedure: RIGHT LONG FINGER IRRIGATION AND DEBRIDEMENT AND AMPUTATION;  Surgeon: Iran Planas, MD;  Location: [place];  Service: Orthopedics;  Laterality: Right;  with IV sedation  . INCISION AND DRAINAGE OF WOUND Right 05/12/2020   Procedure: IRRIGATION AND DEBRIDEMENT WOUND and possible revision amputation;  Surgeon: Iran Planas, MD;  Location:  [place] [facility];  Service: Orthopedics;  Laterality: Right;  needs 60 minutes  . L foot open repair jones fracture  2010    5th metetarsal   . left ankle ganglion cyst  1976  . left carotid endarterectomy  03/2011  . left cataract  1996   right - 1997  . left CTS  2006   R CTS - 2007  . left foot surgery  1998   R surgery/fracture - 1999  . left plantar ganglion cystectomy  1979  . left wrist/hand fusion  2008  . [place], 2004  . NASAL SINUS SURGERY     multiple- x8. last 1997 with obliteration  . REPAIR THORACIC AORTA  2017  . right hand fracture  1969  . ROTATOR CUFF REPAIR  2006   R, than repeat 2011, Dr Theda Sers  . SHOULDER ARTHROSCOPY WITH LABRAL REPAIR Left  07/10/2020   Procedure: SHOULDER ARTHROSCOPY WITH WASHOUT;  Surgeon: Justice Britain, MD;  Location: [facility];  Service: Orthopedics;  Laterality: Left;  . TRIGGER FINGER RELEASE Left 11/11/2014   Procedure: LEFT LONG FINGER RELEASE TRIGGER FINGER/A-1 PULLEY;  Surgeon: Milly Jakob, MD;  Location: [place];  Service: Orthopedics;  Laterality: Left;  . vocal surgery  1996    Allergies: Ivp dye [iodinated diagnostic agents], Penicillins, Sulfa antibiotics, Sulfonamide derivatives, Lisinopril, Metoprolol, Statins, and Propoxyphene n-acetaminophen  Medications: Prior to Admission medications   Medication Sig Start Date End Date Taking? Authorizing Provider  acetaminophen (TYLENOL) 500 MG tablet Take 1,000 mg by mouth every 6 (six) hours as needed for mild pain, fever or headache.   Yes [provider]  albuterol (PROAIR HFA) 108 (90 Base) MCG/ACT inhaler Inhale 2 puffs into the lungs every 6 (six) hours as needed for wheezing or shortness of breath.   Yes [provider]  aspirin 325 MG EC tablet Take 325 mg by mouth daily.   Yes [provider]  BRILINTA 90 MG TABS tablet Take 90 mg by mouth 2 (two) times daily. 11/21/19  Yes [provider]  buPROPion (WELLBUTRIN XL) 300 MG 24 hr tablet Take 1 tablet (300 mg total) by mouth daily. 01/25/20  Yes Hurst, Helene Kelp T, PA-C  celecoxib (CELEBREX) 200 MG capsule TAKE 1 CAPSULE(200 MG) BY MOUTH DAILY Patient taking differently: Take 200 mg by mouth 2 (two) times daily.  10/02/18  Yes [name], NP  Cholecalciferol (VITAMIN D3) 5000 units TABS Take 5,000 Units by mouth daily.   Yes [provider]  clonazePAM (KLONOPIN) 0.5 MG tablet Take 1 tablet (0.5 mg total) by mouth 2 (two) times daily as needed for anxiety. Patient taking differently: Take 0.5 mg by mouth 2 (two) times daily.  06/11/20  Yes Hurst, Teresa T, PA-C  ENTRESTO 49-51 MG Take 1 tablet by mouth 2 (two) times daily. 12/11/19  Yes  [provider]  furosemide (LASIX) 20 MG tablet TAKE 1 TABLET BY MOUTH TWICE DAILY Patient taking differently: Take 20 mg by mouth 2 (two) times daily.  07/17/19  Yes Adrian Prows, MD  gabapentin (NEURONTIN) 400 MG capsule 1 po q am, 1 po at lunch, 2 po qhs. Patient taking differently: Take 400-800 mg by mouth See admin instructions. Take 400 mg by mouth in the morning and 800 mg at bedtime 03/26/20  Yes Hurst, Teresa T, PA-C  IRON PO Take 1 capsule by mouth daily.   Yes [provider]  isosorbide mononitrate (IMDUR) 60 MG 24 hr tablet Take  60 mg by mouth daily. 12/11/19  Yes [provider]  JANUVIA 100 MG tablet TAKE 1 TABLET BY MOUTH EVERY DAY Patient taking differently: Take 100 mg by mouth daily.  05/19/18  Yes [name], DO  Multiple Vitamin (MULTIVITAMIN WITH MINERALS) TABS tablet Take 1 tablet by mouth daily.   Yes [provider]  NARCAN 4 MG/0.1ML LIQD nasal spray kit Place 4 mg into the nose as needed (opioid reversal).  06/18/19  Yes [provider]  nitroGLYCERIN (NITROSTAT) 0.4 MG SL tablet PLACE 1 TABLET UNDER THE TONGUE EVERY 5 MINUTES AS NEEDED FOR CHEST PAIN Patient taking differently: Place 0.4 mg under the tongue every 5 (five) minutes as needed for chest pain.  07/17/19  Yes Adrian Prows, MD  omeprazole (PRILOSEC) 40 MG capsule Take 40 mg by mouth 2 (two) times daily. 11/26/19  Yes [provider]  oxyCODONE-acetaminophen (PERCOCET) 10-325 MG tablet Take 1 tablet by mouth in the morning and at bedtime.  12/24/19  Yes [provider]  rOPINIRole (REQUIP) 1 MG tablet Take 1 tablet (1 mg total) by mouth in the morning and at bedtime. 06/11/20  Yes Donnal Moat T, PA-C  tamsulosin (FLOMAX) 0.4 MG CAPS capsule Take 1 capsule (0.4 mg total) by mouth 2 (two) times daily. Patient taking differently: Take 0.4 mg by mouth daily after supper.  11/16/18  Yes [name], NP  ACCU-CHEK FASTCLIX LANCETS MISC Use as directed to  check blood glucose. Dx: E11.51 01/17/18   [name], NP  aspirin EC 81 MG tablet Take 1 tablet (81 mg total) by mouth daily. 07/15/20   Spero Geralds, MD  Blood Glucose Monitoring Suppl (ACCU-CHEK AVIVA PLUS) w/Device KIT Use as directed to check blood glucose daily. Dx: E11.51 01/17/18   [name], NP  glucose blood (ACCU-CHEK AVIVA PLUS) test strip 1 each by Other route as needed for other. Use as instructed. Dx: E11.51 01/17/18   [name], NP     Family History  Problem Relation Age of Onset  . Lung cancer Father 80  . Diabetes Mother 22  . Osteoporosis Mother   . Depression Mother   . Bleeding Disorder Mother   . Throat cancer Other        family hx - "bad living" - also lung CA, heart disease and renal failure    . Diabetes Sister   . Suicidality Son 38  . Lung cancer Maternal Grandmother 80  . Diabetes Maternal Grandfather 42  . Heart disease Maternal Grandfather   . Heart attack Paternal Grandmother   . Brain cancer Paternal Grandfather 52    Social History   Socioeconomic History  . Marital status: Divorced    Spouse name: Not on file  . Number of children: Not on file  . Years of education: Not on file  . Highest education level: Not on file  Occupational History  . Not on file  Tobacco Use  . Smoking status: Former Smoker    Packs/day: 1.50    Years: 30.00    Pack years: 45.00    Types: Cigarettes    Quit date: 11/06/1988    Years since quitting: 31.7  . Smokeless tobacco: Never Used  Vaping Use  . Vaping Use: Never used  Substance and Sexual Activity  . Alcohol use: Not Currently    Comment: None in 30 years but drank beer before- 05/09/20  . Drug use: No  . Sexual activity: Not Currently  Other  Topics Concern  . Not on file  Social History Narrative   Social History      Diet? I eat about what I want.       Do you drink/eat things with caffeine? yes      Marital status?                  divorced                  What year  were you married? 1966 to 2007      Do you live in a house, apartment, assisted living, condo, trailer, etc.? [place]      Is it one or more stories? one      How many persons live in your home? Me only      Do you have any pets in your home? (please list) no      Highest level of education completed? Finished high school      Current or past profession: Clinical biochemist in [place] you exercise?               no                       Type & how often?      Advanced Directives      Do you have a living will? no      Do you have a DNR form?     no                             If not, do you want to discuss one?      Do you have signed POA/HPOA for forms? No      Functional Status      Do you have difficulty bathing or dressing yourself? No- shower      Do you have difficulty preparing food or eating? No- Don't want to      Do you have difficulty managing your medications?      Do you have difficulty managing your finances?      Do you have difficulty affording your medications? No- paid for by [employer]   Social Determinants of [employer] Strain:   . Difficulty of Paying Living Expenses: Not on file  Food Insecurity:   . Worried About Charity fundraiser in the Last Year: Not on file  . Ran Out of Food in the Last Year: Not on file  Transportation Needs:   . Lack of Transportation (Medical): Not on file  . Lack of Transportation (Non-Medical): Not on file  Physical Activity:   . Days of Exercise per Week: Not on file  . Minutes of Exercise per Session: Not on file  Stress:   . Feeling of Stress : Not on file  Social Connections:   . Frequency of Communication with Friends and Family: Not on file  . Frequency of Social Gatherings with Friends and Family: Not on file  . Attends Religious Services: Not on file  . Active Member of Clubs or Organizations: Not on file  . Attends Archivist Meetings: Not on file  . Marital  Status: Not on file    Review of Systems: A 12 point ROS discussed and pertinent positives are indicated in the HPI above.  All other systems are negative.  Review of  Systems  Constitutional: Negative for fever.  HENT: Negative for congestion.   Respiratory: Negative for cough and shortness of breath.   Cardiovascular: Negative for chest pain.  Gastrointestinal: Negative for abdominal pain.  Musculoskeletal: Positive for myalgias ( difficulty with ROM of bilateral upper extremitites. ).  Neurological: Negative for headaches.  Psychiatric/Behavioral: Negative for behavioral problems and confusion.    Vital Signs: BP (!) 114/34   Pulse 83   Temp 98 F (36.7 C) (Oral)   Resp 18   Ht 6' (1.829 m)   Wt 200 lb 9.9 oz (91 kg)   SpO2 95%   BMI 27.21 kg/m   Physical Exam Vitals and nursing note reviewed.  Constitutional:      Appearance: He is well-developed.  HENT:     Head: Normocephalic.  Cardiovascular:     Rate and Rhythm: Normal rate and regular rhythm.     Heart sounds: Normal heart sounds.  Pulmonary:     Effort: Pulmonary effort is normal.     Breath sounds: Normal breath sounds.  Musculoskeletal:     Cervical back: Normal range of motion.     Comments: Swelling to upper extremities with tenderness.   Skin:    General: Skin is dry.  Neurological:     Mental Status: He is alert and oriented to person, place, and time.     Imaging: DG Chest 1 View  Result Date: 07/12/2020 CLINICAL DATA:  Shortness of breath EXAM: CHEST  1 VIEW COMPARISON:  July 11, 2020 FINDINGS: No edema or airspace opacity. Heart is mildly enlarged with pulmonary vascularity normal. Patient is status post coronary artery bypass grafting. No adenopathy. There is arthropathy and postoperative change in each shoulder. Surgical clips noted in lower left neck region. There is aortic atherosclerosis. IMPRESSION: No edema or airspace opacity. Stable cardiomegaly. Status post coronary artery bypass  grafting. Aortic Atherosclerosis (ICD10-I70.0). Electronically Signed   By: Lowella Grip III M.D.   On: 07/12/2020 17:30   CT SHOULDER LEFT WO CONTRAST  Result Date: 06/18/2020 CLINICAL DATA:  Left shoulder pain. Injury 6 weeks ago. Fluid drained recently. EXAM: CT OF THE UPPER LEFT EXTREMITY WITHOUT CONTRAST TECHNIQUE: Multidetector CT imaging of the upper left extremity was performed according to the standard protocol. COMPARISON:  Radiographs 11/24/2019 and 05/02/2020 FINDINGS: Severe chronic appearing erosive changes involving the glenoid which is markedly widened and thinned. This has the appearance of chronic remodeling and not an acute destructive bony process. The humeral head is intact. Subchondral cystic changes are noted. Moderate spurring. Moderate erosive changes are noted along the head neck junction region of the humerus typically seen with rheumatoid arthritis. Moderate erosive changes are noted at the [facility] joint with bone loss and numerous small erosions. There is a large complex joint effusion which appears to extend out into the subacromial/subdeltoid bursa. It also appears to involve the deltoid musculature. There are some faint calcifications noted. There is 1 small dot of air in the region of the lateral/posterior deltoid. This could be from recent attempted joint aspiration. Chronically torn rotator cuff tendons with marked fatty atrophy of the rotator cuff muscles. The visualized left ribs are intact and the visualized left lung is grossly clear. Cardiac enlargement is noted along with aortic and coronary artery calcifications. Remote healed left rib fractures are noted. IMPRESSION: 1. 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 Marked erosive changes involving the glenohumeral joint and AC joint. Recommend correlation with recent  joint aspiration 2. Large complex joint effusion appears to extend out into  the subacromial/subdeltoid bursa and also probably into the deltoid musculature. 3. Chronically torn rotator cuff tendons with fatty atrophy of the muscles. Electronically Signed   By: Marijo Sanes M.D.   On: 06/18/2020 16:29   DG CHEST PORT 1 VIEW  Result Date: 07/13/2020 CLINICAL DATA:  Respiratory failure with hypoxia. EXAM: PORTABLE CHEST 1 VIEW COMPARISON:  07/12/2020 FINDINGS: Postoperative changes in the mediastinum. Cardiac enlargement with pulmonary vascular congestion. Streaky perihilar infiltrates developing since previous study suggesting edema or possibly multifocal pneumonia. No pleural effusions. No pneumothorax. Mediastinal contours appear intact. Degenerative changes in the spine and shoulders. IMPRESSION: Cardiac enlargement with pulmonary vascular congestion. Developing perihilar infiltrates suggesting edema or multifocal pneumonia. Electronically Signed   By: Lucienne Capers M.D.   On: 07/13/2020 07:01   DG Chest Port 1 View  Result Date: 07/11/2020 CLINICAL DATA:  Chest pain and increasing shortness of breath EXAM: PORTABLE CHEST 1 VIEW COMPARISON:  07/09/2020 FINDINGS: Postoperative changes in the mediastinum. Normal heart size. Developing linear atelectasis in the right mid lung and lung bases. No focal consolidation. No pleural effusions. No pneumothorax. Calcification of the aorta. IMPRESSION: Developing linear atelectasis in the right mid lung and lung bases. Electronically Signed   By: Lucienne Capers M.D.   On: 07/11/2020 04:49   DG Chest Port 1 View  Result Date: 07/09/2020 CLINICAL DATA:  Questionable sepsis EXAM: PORTABLE CHEST 1 VIEW COMPARISON:  05/02/2020, 12/31/2019 FINDINGS: Post sternotomy changes. No focal opacity or pleural effusion. Stable cardiomediastinal silhouette. No pneumothorax. Mild diffuse coarse chronic interstitial opacity. IMPRESSION: No active disease. Electronically Signed   By: Donavan Foil M.D.   On: 07/09/2020 21:30   DG Shoulder Left  Port  Result Date: 07/09/2020 CLINICAL DATA:  Left arm pain swelling and erythema from shoulder EXAM: LEFT SHOULDER COMPARISON:  05/12/2020, CT 06/18/2020 FINDINGS: Marked lateral soft tissue swelling over the shoulder, probably related to complex joint effusion noted on previous imaging. Irregular AC joint arthropathy. Erosive changes of the glenoid. Free-floating acromial fragment, suspect worsened erosive changes at the scapula. IMPRESSION: Marked lateral soft tissue swelling over the shoulder, probably related to complex joint effusion noted on previous imaging. Erosive changes of the glenoid and suspected worsened erosive changes at the scapula and AC joint. There appears to be a free-floating acromial fragment. Findings could be secondary to inflammatory arthropathy versus infection. Findings appear progressive as compared with prior radiograph 05/02/2020. Electronically Signed   By: Donavan Foil M.D.   On: 07/09/2020 21:39   DG FLUORO GUIDED NEEDLE PLC ASPIRATION/INJECTION LOC  Result Date: 06/24/2020 CLINICAL DATA:  Left shoulder effusion FLUOROSCOPY TIME:  0 minutes 16 seconds. 6.84 micro gray meter squared PROCEDURE: Left SHOULDER aspiration UNDER FLUOROSCOPY Skin overlying the left shoulder was scrubbed with Betadine and draped in sterile fashion. Anesthesia was carried out using 1% lidocaine. A 20 gauge spinal needle was placed into the glenohumeral joint. 200 cc cloudy fluid were aspirated and a representative 40 cc submitted for requested studies. I massaged the soft tissues in the shoulder girdle region to help achieve total or subtotal aspiration of the regional fluid. IMPRESSION: Left shoulder aspiration yielding 200 cc of cloudy fluid. Samples submitted for requested studies. Electronically Signed   By: Nelson Chimes M.D.   On: 06/24/2020 11:32   VAS [place] ABI WITH/WO TBI  Result Date: 06/23/2020 LOWER EXTREMITY DOPPLER STUDY Indications: Claudication. High Risk Factors: Hypertension.   Vascular Interventions: Aorta-bifemoral bypass2/23/2012.  Performing Technologist: Ronal Fear RVS, RCS  Examination Guidelines: A complete evaluation includes at minimum, Doppler waveform signals and systolic blood pressure reading at the level of bilateral brachial, anterior tibial, and posterior tibial arteries, when vessel segments are accessible. Bilateral testing is considered an integral part of a complete examination. Photoelectric Plethysmograph (PPG) waveforms and toe systolic pressure readings are included as required and additional duplex testing as needed. Limited examinations for reoccurring indications may be performed as noted.  ABI Findings: +---------+------------------+-----+---------+--------+ Right    Rt Pressure (mmHg)IndexWaveform Comment  +---------+------------------+-----+---------+--------+ Brachial 90                                       +---------+------------------+-----+---------+--------+ PTA      171               1.18 triphasic         +---------+------------------+-----+---------+--------+ DP       146               1.01 biphasic          +---------+------------------+-----+---------+--------+ Great Toe89                0.61                   +---------+------------------+-----+---------+--------+ +---------+------------------+-----+----------+-------+ Left     Lt Pressure (mmHg)IndexWaveform  Comment +---------+------------------+-----+----------+-------+ Brachial 145                                      +---------+------------------+-----+----------+-------+ PTA      72                0.50 monophasic        +---------+------------------+-----+----------+-------+ DP       60                0.41 monophasic        +---------+------------------+-----+----------+-------+ Great Toe67                0.46                   +---------+------------------+-----+----------+-------+  +-------+-----------+-----------+------------+------------+ ABI/TBIToday's ABIToday's TBIPrevious ABIPrevious TBI +-------+-----------+-----------+------------+------------+ Right  1.18       0.61       1.29        0.61         +-------+-----------+-----------+------------+------------+ Left   0.50       0.46       0.64        0.44         +-------+-----------+-----------+------------+------------+  Bilateral ABIs appear essentially unchanged compared to prior study on 06/21/2018.  Summary: Right: Resting right ankle-brachial index is within normal range. No evidence of significant right lower extremity arterial disease. The right toe-brachial index is abnormal. Left: Resting left ankle-brachial index indicates moderate left lower extremity arterial disease. The left toe-brachial index is abnormal.  *See table(s) above for measurements and observations.  Electronically signed by Harold Barban MD on 06/23/2020 at 12:18:17 PM.    Final    ECHOCARDIOGRAM COMPLETE  Result Date: 07/10/2020    ECHOCARDIOGRAM REPORT   Patient Name:   CHIN WACHTER Date of Exam: 07/10/2020 Medical Rec #:  [id number]   Height:       72.0 in Accession #:    [id number]  Weight:  203.9 lb Date of Birth:  [birth year]   BSA:          2.148 m Patient Age:    80 years    BP:           78/86 mmHg Patient Gender: M           HR:           89 bpm. Exam Location:  Inpatient Procedure: 2D Echo, Intracardiac Opacification Agent, Color Doppler and Cardiac            Doppler Indications:    Dyspnea 786.09 / R06.00  History:        Patient has prior history of Echocardiogram examinations, most                 recent 09/19/2015. CAD, Prior CABG, PAD and COPD,                 Signs/Symptoms:Fever and Hypotension; Risk Factors:Diabetes,                 Hypertension and Dyslipidemia. Septic shock likely from septic                 arthritis of left shoulder.  Sonographer:    Darlina Sicilian RDCS Referring Phys: 89 VINEET SOOD IMPRESSIONS   1. Technically difficult study with limited views  2. Left ventricular ejection fraction, by estimation, is 50 to 55%. The left ventricle has low normal function. Left ventricular diastolic parameters were normal.  3. Right ventricular systolic function is normal. The right ventricular size is mildly enlarged.  4. Left atrial size was mildly dilated.  5. The mitral valve is normal in structure. Mild mitral valve regurgitation.  6. The aortic valve is tricuspid. Aortic valve regurgitation is not visualized. Mild to moderate aortic valve sclerosis/calcification is present, without any evidence of aortic stenosis. FINDINGS  Left Ventricle: Left ventricular ejection fraction, by estimation, is 50 to 55%. The left ventricle has low normal function. The left ventricle has no regional wall motion abnormalities. Definity contrast agent was given IV to delineate the left ventricular endocardial borders. The left ventricular internal cavity size was mildly dilated. There is no left ventricular hypertrophy. Left ventricular diastolic parameters were normal. Right Ventricle: The right ventricular size is mildly enlarged. Right vetricular wall thickness was not assessed. Right ventricular systolic function is normal. Left Atrium: Left atrial size was mildly dilated. Right Atrium: Right atrial size was not well visualized. Pericardium: There is no evidence of pericardial effusion. Mitral Valve: The mitral valve is normal in structure. Mild mitral valve regurgitation. Tricuspid Valve: The tricuspid valve is normal in structure. Tricuspid valve regurgitation is trivial. Aortic Valve: The aortic valve is tricuspid. Aortic valve regurgitation is not visualized. Mild to moderate aortic valve sclerosis/calcification is present, without any evidence of aortic stenosis. Pulmonic Valve: The pulmonic valve was not well visualized. Pulmonic valve regurgitation is not visualized. Aorta: The aortic root was not well visualized. IAS/Shunts:  The interatrial septum was not well visualized.   LV Volumes (MOD) LV vol d, MOD A2C: 271.0 ml Diastology LV vol d, MOD A4C: 258.0 ml LV e' medial:    8.38 cm/s LV vol s, MOD A2C: 154.0 ml LV E/e' medial:  13.1 LV vol s, MOD A4C: 117.5 ml LV e' lateral:   18.30 cm/s LV SV MOD A2C:     117.0 ml LV E/e' lateral: 6.0 LV SV MOD A4C:     258.0 ml LV SV MOD BP:  129.3 ml RIGHT VENTRICLE TAPSE (M-mode): 2.1 cm LEFT ATRIUM             Index LA Vol (A2C):   77.5 ml 36.08 ml/m LA Vol (A4C):   85.2 ml 39.66 ml/m LA Biplane Vol: 82.6 ml 38.45 ml/m  AORTIC VALVE LVOT Vmax:   110.00 cm/s LVOT Vmean:  80.000 cm/s LVOT VTI:    0.206 m MITRAL VALVE                TRICUSPID VALVE MV Area (PHT): 5.62 cm     TR Peak grad:   22.3 mmHg MV Decel Time: 135 msec     TR Vmax:        236.00 cm/s MV E velocity: 110.00 cm/s MV A velocity: 80.00 cm/s   SHUNTS MV E/A ratio:  1.38         Systemic VTI: 0.21 m Oswaldo Milian MD Electronically signed by Oswaldo Milian MD Signature Date/Time: 07/10/2020/1:49:09 PM    Final    VAS US CAROTID  Result Date: 06/23/2020 Carotid Arterial Duplex Study Indications:       Carotid artery disease                    Left carotid endarterectomy 04/01/2011.                    Known right innominate artery stenosis. Risk Factors:      Hypertension. Comparison Study:  06/21/2018                    R=1-39%, L=1-39% Performing Technologist: Ronal Fear RVS, RCS  Examination Guidelines: A complete evaluation includes B-mode imaging, spectral Doppler, color Doppler, and power Doppler as needed of all accessible portions of each vessel. Bilateral testing is considered an integral part of a complete examination. Limited examinations for reoccurring indications may be performed as noted.  Right Carotid Findings: +----------+--------+--------+--------+------------------+--------+           PSV cm/sEDV cm/sStenosisPlaque DescriptionComments  +----------+--------+--------+--------+------------------+--------+ CCA Prox  118     12                                         +----------+--------+--------+--------+------------------+--------+ CCA Mid   154     17              heterogenous               +----------+--------+--------+--------+------------------+--------+ CCA Distal129                     calcific                   +----------+--------+--------+--------+------------------+--------+ ICA Prox  109     9       1-39%   calcific                   +----------+--------+--------+--------+------------------+--------+ ICA Mid   134     16                                         +----------+--------+--------+--------+------------------+--------+ ICA Distal170     21                                         +----------+--------+--------+--------+------------------+--------+  ECA       128                                                +----------+--------+--------+--------+------------------+--------+ +----------+--------+-------+---------+-------------------+           PSV cm/sEDV cmsDescribe Arm Pressure (mmHG) +----------+--------+-------+---------+-------------------+ Subclavian218            Turbulent                    +----------+--------+-------+---------+-------------------+ +---------+--------+--------+----------+ VertebralPSV cm/sEDV cm/sRetrograde +---------+--------+--------+----------+  Left Carotid Findings: +----------+--------+--------+--------+------------------+--------+           PSV cm/sEDV cm/sStenosisPlaque DescriptionComments +----------+--------+--------+--------+------------------+--------+ CCA Prox  100                                                +----------+--------+--------+--------+------------------+--------+ CCA Mid   100                     heterogenous               +----------+--------+--------+--------+------------------+--------+ CCA  Distal120     11              heterogenous               +----------+--------+--------+--------+------------------+--------+ ICA Prox  68      9       1-39%   heterogenous               +----------+--------+--------+--------+------------------+--------+ ICA Mid   87      17                                         +----------+--------+--------+--------+------------------+--------+ ICA Distal100     18                                         +----------+--------+--------+--------+------------------+--------+ ECA       235                                                +----------+--------+--------+--------+------------------+--------+ +----------+--------+--------+----------------+-------------------+           PSV cm/sEDV cm/sDescribe        Arm Pressure (mmHG) +----------+--------+--------+----------------+-------------------+ [id number]             Multiphasic, WNL                    +----------+--------+--------+----------------+-------------------+ +---------+--------+--+--------+--+---------+ VertebralPSV cm/s96EDV cm/s10Antegrade +---------+--------+--+--------+--+---------+   Summary: Right Carotid: Velocities in the right ICA are consistent with a 1-39% stenosis. Left Carotid: Velocities in the left ICA are consistent with a 1-39% stenosis. Vertebrals:  Left vertebral artery demonstrates antegrade flow. Right vertebral              artery demonstrates retrograde flow. Subclavians: Right subclavian artery flow was disturbed. Normal flow              hemodynamics were seen in  the left subclavian artery. *See table(s) above for measurements and observations.  Electronically signed by Harold Barban MD on 06/23/2020 at 12:18:31 PM.    Final    VAS [place] LOWER EXTREMITY VENOUS (DVT)  Result Date: 07/11/2020  Lower Venous DVT Study Indications: Swelling.  Comparison Study: No prior studies. Performing Technologist: Darlin Coco, RDMS  Examination Guidelines: A  complete evaluation includes B-mode imaging, spectral Doppler, color Doppler, and power Doppler as needed of all accessible portions of each vessel. Bilateral testing is considered an integral part of a complete examination. Limited examinations for reoccurring indications may be performed as noted. The reflux portion of the exam is performed with the patient in reverse Trendelenburg.  +---------+---------------+---------+-----------+----------+--------------+ RIGHT    CompressibilityPhasicitySpontaneityPropertiesThrombus Aging +---------+---------------+---------+-----------+----------+--------------+ CFV      Full           Yes      Yes                                 +---------+---------------+---------+-----------+----------+--------------+ SFJ      Full                                                        +---------+---------------+---------+-----------+----------+--------------+ FV Prox  Full                                                        +---------+---------------+---------+-----------+----------+--------------+ FV Mid   Full                                                        +---------+---------------+---------+-----------+----------+--------------+ FV DistalFull                                                        +---------+---------------+---------+-----------+----------+--------------+ PFV      Full                                                        +---------+---------------+---------+-----------+----------+--------------+ POP      Full           Yes      Yes                                 +---------+---------------+---------+-----------+----------+--------------+ PTV      Full                                                        +---------+---------------+---------+-----------+----------+--------------+  PERO     Full                                                         +---------+---------------+---------+-----------+----------+--------------+   +----+---------------+---------+-----------+----------+--------------+ LEFTCompressibilityPhasicitySpontaneityPropertiesThrombus Aging +----+---------------+---------+-----------+----------+--------------+ CFV Full           Yes      Yes                                 +----+---------------+---------+-----------+----------+--------------+     Summary: RIGHT: - There is no evidence of deep vein thrombosis in the lower extremity.  - No cystic structure found in the popliteal fossa.  LEFT: - No evidence of common femoral vein obstruction.  *See table(s) above for measurements and observations. Electronically signed by Jamelle Haring on 07/11/2020 at 2:52:37 PM.    Final    [place] EKG SITE RITE  Result Date: 07/14/2020 If Site Rite image not attached, placement could not be confirmed due to current cardiac rhythm.   Labs:  CBC: Recent Labs    07/11/20 0425 07/11/20 0425 07/12/20 0422 07/12/20 1704 07/13/20 0603 07/14/20 0420  WBC 10.4  --  9.2  --  11.5* 9.4  HGB 9.1*   < > 7.9* 9.2* 10.2* 9.8*  HCT 27.7*   < > 23.2* 27.9* 30.8* 29.1*  PLT 355  --  320  --  413* 403*   < > = values in this interval not displayed.    COAGS: Recent Labs    05/02/20 1620 07/09/20 2105  INR 1.1 1.3*  APTT 39* 38*    BMP: Recent Labs    11/23/19 0724 11/23/19 0724 12/29/19 0548 12/29/19 0548 05/02/20 1620 05/02/20 1620 05/04/20 1132 05/07/20 1309 07/12/20 0422 07/12/20 1704 07/13/20 0603 07/14/20 0420  NA 129*   < > 128*   < > 128*   < > 127*   < > 131* 130* 131* 134*  K 4.1   < > 4.5   < > 4.6   < > 4.6   < > 3.7 3.8 3.6 3.6  CL 93*   < > 94*   < > 95*   < > 92*   < > 98 97* 97* 96*  CO2 24   < > 25   < > 21*   < > 24   < > 24 22 23 28   GLUCOSE 179*   < > 122*   < > 227*   < > 240*   < > 131* 181* 175* 165*  BUN 22   < > 20   < > 28*   < > 39*   < > 17 22 23 22   CALCIUM 8.3*   < > 8.9   < > 8.3*   <  > 8.3*   < > 7.9* 8.1* 8.3* 8.6*  CREATININE 1.05   < > 0.92   < > 1.21   < > 1.70*   < > 0.58* 0.78 0.73 0.66  GFRNONAA >60   < > >60   < > 59*   < > 39*   < > >60 >60 >60 >60  GFRAA >60  --  >60  --  >60  --  45*  --   --   --   --   --    < > =  values in this interval not displayed.    LIVER FUNCTION TESTS: Recent Labs    05/02/20 1620 05/02/20 1620 07/09/20 2105 07/10/20 0253 07/11/20 0425 07/14/20 0420  BILITOT 0.8  --  0.8 0.6 1.0  --   AST 102*   < > 19 15 20  67*  ALT 56*   < > 16 13 13  34  ALKPHOS 173*  --  125 106 113  --   PROT 7.3  --  7.8 6.2* 6.4*  --   ALBUMIN 2.9*  --  3.2* 2.6* 2.6*  --    < > = values in this interval not displayed.    TUMOR MARKERS: No results for input(s): AFPTM, CEA, CA199, CHROMGRNA in the last 8760 hours.  Assessment and Plan:  74 y.o. male inpatient. History of DM, PAD, CAD s/p CABG. Presented tot he ED with weakness and fever. The patient was found to be in septic shock  (group B streptococcal bacteremia) due to a septic left shoulder s/p I & D and washout on 11.25.21 with concern for a septic right shoulder due to limited ROM and tenderness. Team is requesting a tunneled central catheter for ongoing antibiotic administration.   All pertinent labs are within acceptable parameters. Patient is on brilinta. Allergies include IV dye, PCN, Sulfa. IR consulted for possible tunneled central catheter placement. Case has been reviewed and procedure approved by Dr. Earleen Newport.  Patient tentatively scheduled for 12.1.21. Per Dr. Earleen Newport the brillinta does not need to be held for the procedure.   Team instructed to: Keep Patient to be NPO after midnight IR will call patient when ready.  Risks and benefits of image guided tunneled central catheter placement. placement was discussed with the patient including, but not limited to bleeding, infection, pneumothorax, or fibrin sheath development and need for additional procedures.  All of the patient's  questions were answered, patient is agreeable to proceed. Consent signed and in chart.   Thank you for this interesting consult.  I greatly enjoyed meeting Camilo Mander [place] and look forward to participating in their care.  A copy of this report was sent to the requesting provider on this date.  Electronically Signed: Jacqualine Mau, NP 07/15/2020, 2:26 PM   I spent a total of 40 Minutes    in face to face in clinical consultation, greater than 50% of which was counseling/coordinating care for tunneled central catheter placement.

## 2020-07-15 NOTE — Progress Notes (Signed)
CRITICAL VALUE ALERT  Critical Value:  Troponin 364  Date & Time Notied:  07/15/2020 1910  Provider Notified: [facility]-Link notified   Orders Received/Actions taken: Awaiting new orders

## 2020-07-15 NOTE — Progress Notes (Addendum)
NAME:  [name], MRN:  [id number], DOB:  04/30/1946, LOS: 6 ADMISSION DATE:  07/09/2020, CONSULTATION DATE: 07/09/2020 REFERRING MD:  Dr. Regenia Skeeter, ER, CHIEF COMPLAINT:  Sepsis   Brief History   74 yo male former smoker with hx of chronic Left shoulder pain requiring arthrocentesis developed weakness, fever 102F, and hypotension (BP 74/57).  Found to have swollen/red left arm.  Had arthrocentesis in ER and started on antibiotics.  PCCM asked to admit to ICU to manage sepsis.  History of present illness   Patient lethargic and not able to provide accurate history.  History from chart and ER staff.  Past Medical History  Vit D deficiency, Varicose veins, RLS, PAD, Peripheral neuropathy, Osteoporosis, CAD with NSTEMI in 2021--  Not fully revascularized, Hyponatremia, HTN, HLD, DM type 2, Diabetic foot, Depression, COPD, Chronic pain, BPH, Blind Lt eye, Anxiety, GERD, BPH  Significant Hospital Events   11/24 Admit  Consults:  [facility]   11/26  ID  11/25   Procedures:  I&D left shoulder 11/25  Significant Diagnostic Tests:  Lt shoulder arthrocentesis 11/24 >> brown/turbid, WBC 30,597 (96% neutrophils) >>>  see micro data Transthoracic echo 11/25> LVEF 34-19%, normal diastolic function. Left atrial dilation. Mildly enlarged RV. Mild MR. Ultrasound right lower extremity 11/26 > neg  Micro Data:  COVID/Flu 11/24 >> negative Lt shoulder synovial fluid 11/24 >> strep agalactiae Blood 11/24 >> strep agalactiae sensitive to rocephin BC x 2 11/24 >>> Urine 11/25>>  Pseudomonas x 10k plus 10k strep agalatiae /pansensitive including rocephin MRSA PCR 11/25 >> negative BC x 2  11/26 >>> NGTD R shoulder tap 11/28 >>> +WBC no organisms   Antimicrobials:  Vancomycin 11/24 >> 11/26  Rocephin on 11/24  And again  11/26 only Cefepime 11/25 > 11/26    PCN  G  11/27     Scheduled Meds: . aspirin EC  81 mg Oral Daily  . carvedilol  3.125 mg Oral BID WC  .  Chlorhexidine Gluconate Cloth  6 each Topical Daily  . docusate sodium  100 mg Oral BID  . enoxaparin (LOVENOX) injection  40 mg Subcutaneous Q24H  . insulin aspart  0-15 Units Subcutaneous Q4H  . mouth rinse  15 mL Mouth Rinse q12n4p  . mouth rinse  15 mL Mouth Rinse BID  . pantoprazole  40 mg Oral Daily  . rOPINIRole  1 mg Oral TID  . tamsulosin  0.4 mg Oral Daily  . ticagrelor  90 mg Oral BID   Continuous Infusions: . sodium chloride Stopped (07/10/20 0748)  . penicillin g continuous IV infusion Stopped (07/15/20 0805)   PRN Meds:.acetaminophen, albuterol, bisacodyl, guaiFENesin, magnesium citrate, menthol-cetylpyridinium **OR** phenol, metoCLOPramide **OR** metoCLOPramide (REGLAN) injection, ondansetron **OR** ondansetron (ZOFRAN) IV, oxyCODONE, polyethylene glycol  Interim history/subjective:  No overnight issues. Blood pressures better this morning. Having some urinary retention despite resuming flomax.   Objective   Blood pressure (!) 145/123, pulse 83, temperature 97.9 F (36.6 C), temperature source Oral, resp. rate 19, height 6' (1.829 m), weight 91 kg, SpO2 98 %.        Intake/Output Summary (Last 24 hours) at 07/15/2020 0822 Last data filed at 07/15/2020 0615 Gross per 24 hour  Intake 1734.45 ml  Output 1220 ml  Net 514.45 ml   Filed Weights   07/09/20 2017 07/10/20 0130 07/12/20 0515  Weight: 86.2 kg 92.5 kg 91 kg    Examination: Pt alert, chronically ill appearing Oropharynx clear,  mucosa nl Clear to auscultation, no  wheezes or crackles RRR, freqent PACs Mild pitting edema Abd soft nontender Extr warm with no clubbing noted/ L shoulder dressing clean  Neuro  AOx3, normal speech, no focal asymmetry  Resolved Hospital Problem list   Septic shock NAGMA Respiratory failure Acute metabolic encephalopathy Assessment & Plan:   Septic arthritis of left shoulder and strep bacteremia - changed to high dose PCN  By ID 11/27. End date January  6th. -orthopedic surgery consulted - s/p shoulder aspiration 11/25. Drain in place.   NSTEMI, likely demand related to sepsis CAD with HFpEF, circumflex artery is not re-vascularized - Has a statin allergy - Con't aspirin  brilinta 11/26 and adjust hep to low therapeutic since req'd transfusion 11/27  - gradually resuming home meds now, coreg first, then entresto and then imdur when able.   -Cardiology consult-[facility] 11/26. Medical management advised.   Acute urinary retention - likely secondary to held flomax - foley placed 11/28, discontinued and doing IO cath now.   Hypervolemic hyponatremia; TSH and cortisol WNL-volume resuscitated at admission -Continue to monitor  Chronic right lower extremity edema -Doppler ultrasound 11/26 neg  Hx of chronic joint pain, restless leg syndrome, diabetic neuropathy -Continue to hold outpt wellbutrin, celebrex, klonopin, duragesic patch, neurontin, percocet - held due to encephalopathy previously. Will reusme gapabentin and wellbutrin  Restless leg syndrome -Con't home Requip  Hx of COPD; no inhalers PTA -Albuterol every 4 hours as needed  DM type 2, suboptimally controlled with hyperglycemia -Holding OP oral hypoglycemics -Con't SSI PRN -Goal BG 140-180  Hx of GERD on PPI PTA -protonix changed to po 11/28   Anemia, chronic. Likely due to chronic disease   -Transfused11/27 pm with approp response in hgb level suggesting no active bleeding    Best practice (evaluated daily)   Diet: clears advance as tolerated to heart healthy diet DVT prophylaxis: SCDs GI prophylaxis: Protonix Mobility: advance as tolerated will consult Pt/OT last date of multidisciplinary goals of care discussion:  Family and staff present : son in [place]. I offered to call and update but patient declined.  Code Status: Full code Disposition: ok for med surg, will sign out to [facility] to assume care 12/1   [facility] Geralds [facility] Pulmonary and  Critical Care Medicine 07/15/2020 8:22 AM  Pager: [phone] After hours pager: [phone]   Labs   CBC: Recent Labs  Lab 07/09/20 2105 07/09/20 2105 07/10/20 0253 07/10/20 0253 07/11/20 0425 07/12/20 0422 07/12/20 1704 07/13/20 0603 07/14/20 0420  WBC 11.6*   < > 10.1  --  10.4 9.2  --  11.5* 9.4  NEUTROABS 9.8*  --   --   --  8.1*  --   --   --   --   HGB 10.5*   < > 9.7*   < > 9.1* 7.9* 9.2* 10.2* 9.8*  HCT 32.0*   < > 29.5*   < > 27.7* 23.2* 27.9* 30.8* 29.1*  MCV 88.4   < > 90.8  --  88.5 86.2  --  87.5 86.6  PLT 360   < > 311  --  355 320  --  413* 403*   < > = values in this interval not displayed.    Basic Metabolic Panel: Recent Labs  Lab 07/11/20 0425 07/12/20 0422 07/12/20 1704 07/13/20 0603 07/14/20 0420  NA 129* 131* 130* 131* 134*  K 4.3 3.7 3.8 3.6 3.6  CL 100 98 97* 97* 96*  CO2 19* 24 22 23 28   GLUCOSE 143* 131*  181* 175* 165*  BUN 13 17 22 23 22   CREATININE 0.60* 0.58* 0.78 0.73 0.66  CALCIUM 8.1* 7.9* 8.1* 8.3* 8.6*  MG 1.8 1.9  --   --   --    GFR: Estimated Creatinine Clearance: 88.9 mL/min (by C-G formula based on SCr of 0.66 mg/dL). Recent Labs  Lab 07/09/20 2105 07/09/20 2326 07/10/20 0253 07/11/20 0425 07/12/20 0422 07/13/20 0603 07/14/20 0420  WBC 11.6*  --    < > 10.4 9.2 11.5* 9.4  LATICACIDVEN 1.6 1.2  --   --   --   --   --    < > = values in this interval not displayed.    Liver Function Tests: Recent Labs  Lab 07/09/20 2105 07/10/20 0253 07/11/20 0425 07/14/20 0420  AST 19 15 20  67*  ALT 16 13 13  34  ALKPHOS 125 106 113  --   BILITOT 0.8 0.6 1.0  --   PROT 7.8 6.2* 6.4*  --   ALBUMIN 3.2* 2.6* 2.6*  --    No results for input(s): LIPASE, AMYLASE in the last 168 hours. No results for input(s): AMMONIA in the last 168 hours.  ABG    Component Value Date/Time   PHART 7.317 (L) 09/22/2015 1908   PCO2ART 44.4 09/22/2015 1908   PO2ART 81.0 09/22/2015 1908   HCO3 22.8 09/22/2015 1908   TCO2 22  05/07/2020 1444   ACIDBASEDEF 3.0 (H) 09/22/2015 1908   O2SAT 95.0 09/22/2015 1908     Coagulation Profile: Recent Labs  Lab 07/09/20 2105  INR 1.3*    Cardiac Enzymes: No results for input(s): CKTOTAL, CKMB, CKMBINDEX, TROPONINI in the last 168 hours.  HbA1C: Hemoglobin A1C  Date/Time Value Ref Range Status  12/01/2017 12:00 AM 6.6  Final  07/28/2017 12:00 AM 6.5  Final   Hgb A1c MFr Bld  Date/Time Value Ref Range Status  05/12/2020 06:22 PM 7.2 (H) 4.8 - 5.6 % Final    Comment:    (NOTE) Pre diabetes:          5.7%-6.4%  Diabetes:              >6.4%  Glycemic control for   <7.0% adults with diabetes   06/28/2018 03:25 PM 6.5 (H) <5.7 % of total Hgb Final    Comment:    For someone without known diabetes, a hemoglobin A1c value of 6.5% or greater indicates that they may have  diabetes and this should be confirmed with a follow-up  test. . For someone with known diabetes, a value <7% indicates  that their diabetes is well controlled and a value  greater than or equal to 7% indicates suboptimal  control. A1c targets should be individualized based on  duration of diabetes, age, comorbid conditions, and  other considerations. . Currently, no consensus exists regarding use of hemoglobin A1c for diagnosis of diabetes for children. .     CBG: Recent Labs  Lab 07/14/20 1606 07/14/20 1958 07/14/20 2321 07/15/20 0315 07/15/20 0746  GLUCAP 168* 125* 165* 101* 129*

## 2020-07-16 ENCOUNTER — Inpatient Hospital Stay (HOSPITAL_COMMUNITY): Payer: Medicare Other

## 2020-07-16 DIAGNOSIS — B965 Pseudomonas (aeruginosa) (mallei) (pseudomallei) as the cause of diseases classified elsewhere: Secondary | ICD-10-CM

## 2020-07-16 DIAGNOSIS — A401 Sepsis due to streptococcus, group B: Principal | ICD-10-CM

## 2020-07-16 LAB — GLUCOSE, CAPILLARY
Glucose-Capillary: 145 mg/dL — ABNORMAL HIGH (ref 70–99)
Glucose-Capillary: 148 mg/dL — ABNORMAL HIGH (ref 70–99)
Glucose-Capillary: 150 mg/dL — ABNORMAL HIGH (ref 70–99)
Glucose-Capillary: 206 mg/dL — ABNORMAL HIGH (ref 70–99)
Glucose-Capillary: 234 mg/dL — ABNORMAL HIGH (ref 70–99)

## 2020-07-16 LAB — CULTURE, BLOOD (ROUTINE X 2)
Culture: NO GROWTH
Culture: NO GROWTH
Special Requests: ADEQUATE
Special Requests: ADEQUATE

## 2020-07-16 LAB — MAGNESIUM: Magnesium: 2 mg/dL (ref 1.7–2.4)

## 2020-07-16 LAB — PHOSPHORUS: Phosphorus: 3.2 mg/dL (ref 2.5–4.6)

## 2020-07-16 MED ORDER — SODIUM CHLORIDE 0.9 % IV SOLN
2.0000 g | Freq: Three times a day (TID) | INTRAVENOUS | Status: DC
  Administered 2020-07-16 – 2020-07-22 (×20): 2 g via INTRAVENOUS
  Filled 2020-07-16 (×21): qty 2

## 2020-07-16 NOTE — TOC Progression Note (Signed)
Transition of Care [facility]) - Progression Note    Patient Details  Name: [name] MRN: [id number] Date of Birth: 11-Feb-1946  Transition of Care [facility] CM/SW Contact  Leeroy Cha, RN Phone Number: 07/16/2020, 8:29 AM  Clinical Narrative:    Chart update: 74 y.o. male with Group B streptococcal bacteremia and septic shock due to left septic shoulder sp I and D. He has had multiple shoulder aspirations with elevated WBC in them. He also had a right shoulder aspirate performed yesterday with 16900 WBC, 97% PMNs.   It is now growing  PSEUDOMONAS--alerted by Nicoletta Dress this ame to this  #1 GBS bacteremia with LEFT septic shoulder  Changing to cefepime   #2 Right SEPTIC shoulder with Pseudomonas  --patient needs I and D of right shoulder w Orthopedic surgery  Switch to cefepime and followup sensis Plan was to send to snf but patient refuses-see earlier note/ Following for progression  Expected Discharge Plan: [place] Barriers to Discharge: No Barriers Identified  Expected Discharge Plan and Services Expected Discharge Plan: [place]   Discharge Planning Services: CM Consult   Living arrangements for the past 2 months: Single Family Home                           HH Arranged: Refused SNF           Social Determinants of Health (SDOH) Interventions    Readmission Risk Interventions No flowsheet data found.

## 2020-07-16 NOTE — Progress Notes (Addendum)
Subjective: Did NOT tolerate MRI due to RIGHT SHOULDER PAIN  Antibiotics:  Anti-infectives (From admission, onward)   Start     Dose/Rate Route Frequency Ordered Stop   07/12/20 1800  penicillin G potassium 12 Million Units in dextrose 5 % 500 mL continuous infusion        12 Million Units 41.7 mL/hr over 12 Hours Intravenous Every 12 hours 07/12/20 1100     07/11/20 1800  cefTRIAXone (ROCEPHIN) 2 g in sodium chloride 0.9 % 100 mL IVPB  Status:  Discontinued        2 g 200 mL/hr over 30 Minutes Intravenous Every 24 hours 07/11/20 1235 07/12/20 1100   07/10/20 2200  cefTRIAXone (ROCEPHIN) 2 g in sodium chloride 0.9 % 100 mL IVPB  Status:  Discontinued        2 g 200 mL/hr over 30 Minutes Intravenous Every 24 hours 07/09/20 2347 07/10/20 0926   07/10/20 1000  vancomycin (VANCOCIN) IVPB 1000 mg/200 mL premix  Status:  Discontinued        1,000 mg 200 mL/hr over 60 Minutes Intravenous Every 12 hours 07/10/20 0033 07/11/20 1235   07/10/20 1000  ceFEPIme (MAXIPIME) 2 g in sodium chloride 0.9 % 100 mL IVPB  Status:  Discontinued        2 g 200 mL/hr over 30 Minutes Intravenous Every 8 hours 07/10/20 0939 07/11/20 1235   07/09/20 2100  vancomycin (VANCOREADY) IVPB 1750 mg/350 mL        1,750 mg 175 mL/hr over 120 Minutes Intravenous STAT 07/09/20 2045 07/09/20 2333   07/09/20 2045  vancomycin (VANCOCIN) IVPB 1000 mg/200 mL premix  Status:  Discontinued        1,000 mg 200 mL/hr over 60 Minutes Intravenous  Once 07/09/20 2041 07/09/20 2045   07/09/20 2045  cefTRIAXone (ROCEPHIN) 2 g in sodium chloride 0.9 % 100 mL IVPB        2 g 200 mL/hr over 30 Minutes Intravenous  Once 07/09/20 2041 07/09/20 2209      Medications: Scheduled Meds: . aspirin EC  81 mg Oral Daily  . buPROPion  300 mg Oral Daily  . carvedilol  6.25 mg Oral BID WC  . Chlorhexidine Gluconate Cloth  6 each Topical Daily  . docusate sodium  100 mg Oral BID  . enoxaparin (LOVENOX) injection  40 mg Subcutaneous  Q24H  . gabapentin  400 mg Oral Daily  . gabapentin  800 mg Oral QHS  . insulin aspart  0-15 Units Subcutaneous Q4H  . mouth rinse  15 mL Mouth Rinse q12n4p  . mouth rinse  15 mL Mouth Rinse BID  . pantoprazole  40 mg Oral Daily  . rOPINIRole  1 mg Oral TID  . tamsulosin  0.4 mg Oral Daily  . ticagrelor  90 mg Oral BID   Continuous Infusions: . sodium chloride Stopped (07/10/20 0748)  . penicillin g continuous IV infusion 41.7 mL/hr at 07/16/20 0603   PRN Meds:.acetaminophen, albuterol, bisacodyl, guaiFENesin, magnesium citrate, menthol-cetylpyridinium **OR** phenol, metoCLOPramide **OR** metoCLOPramide (REGLAN) injection, ondansetron **OR** ondansetron (ZOFRAN) IV, oxyCODONE, polyethylene glycol    Objective: Weight change:   Intake/Output Summary (Last 24 hours) at 07/16/2020 0705 Last data filed at 07/16/2020 0603 Gross per 24 hour  Intake 1285.07 ml  Output 1550 ml  Net -264.93 ml   Blood pressure 122/67, pulse 85, temperature 98.1 F (36.7 C), temperature source Oral, resp. rate 15, height 6' (1.829 m), weight 91 kg,  SpO2 95 %. Temp:  [97.9 F (36.6 C)-98.1 F (36.7 C)] 98.1 F (36.7 C) (11/30 2000) Pulse Rate:  [74-85] 85 (12/01 0600) Resp:  [12-20] 15 (12/01 0600) BP: (113-145)/(34-123) 122/67 (12/01 0400) SpO2:  [94 %-100 %] 95 % (12/01 0600)  Physical Exam: General: Alert and awake, oriented x3 bandage HEENT: anicteric sclera, EOMI CVS regular rate, normal  Chest: , no wheezing, no respiratory distress Abdomen: soft non-distended,  Extremities: left shoulder with dressing, right shoulder is tender to palpation and limited range of motion  Skin: no rashes Neuro: nonfocal  CBC:    BMET Recent Labs    07/14/20 0420  NA 134*  K 3.6  CL 96*  CO2 28  GLUCOSE 165*  BUN 22  CREATININE 0.66  CALCIUM 8.6*     Liver Panel  Recent Labs    07/14/20 0420  AST 67*  ALT 34       Sedimentation Rate No results for input(s): ESRSEDRATE in the last  72 hours. C-Reactive Protein No results for input(s): CRP in the last 72 hours.  Micro Results: Recent Results (from the past 720 hour(s))  Anaerobic and Aerobic Culture     Status: None   Collection Time: 06/24/20 11:33 AM   Specimen: Joint, Left Shoulder; Synovial Fluid  Result Value Ref Range Status   MICRO NUMBER: [id number]  Final   SPECIMEN QUALITY: Adequate  Final   Source: FLUID, SYNOVIAL  Final   STATUS: FINAL  Final   GRAM STAIN:   Final    Many Polymorphonuclear leukocytes No organisms seen   ANA RESULT: No anaerobes isolated.  Final   MICRO NUMBER: [id number]  Final   SPECIMEN QUALITY: Adequate  Final   SOURCE: FLUID, SYNOVIAL  Final   STATUS: FINAL  Final   AER RESULT: No Growth  Final  Blood Culture (routine x 2)     Status: None   Collection Time: 07/09/20  9:05 PM   Specimen: BLOOD LEFT FOREARM  Result Value Ref Range Status   Specimen Description   Final    BLOOD LEFT FOREARM Performed at [facility] [address], [place], [place] [address]    Special Requests   Final    BOTTLES DRAWN AEROBIC AND ANAEROBIC Blood Culture results may not be optimal due to an excessive volume of blood received in culture bottles Performed at [facility] [address], [place], [place] [address]    Culture   Final    NO GROWTH 5 DAYS Performed at [facility], [place] [address] [place], [place] [address]    Report Status 07/15/2020 FINAL  Final  Blood Culture (routine x 2)     Status: Abnormal   Collection Time: 07/09/20  9:05 PM   Specimen: BLOOD RIGHT FOREARM  Result Value Ref Range Status   Specimen Description   Final    BLOOD RIGHT FOREARM Performed at [facility] [address] [place], [place] [address]    Special Requests   Final    BOTTLES DRAWN AEROBIC AND ANAEROBIC Blood Culture adequate volume Performed at [facility] [address], [place],  [address]    Culture   Setup Time   Final    GRAM POSITIVE COCCI IN CHAINS IN BOTH AEROBIC AND ANAEROBIC BOTTLES CRITICAL RESULT CALLED TO, READ BACK BY AND VERIFIED WITHDomenick Gong PHARMD [id number] 07/10/20 A BROWNING Performed at [facility], [place] [address] [place], [place]  [address]    Culture GROUP B STREP(S.AGALACTIAE)ISOLATED (A)  Final   Report Status 07/12/2020 FINAL  Final   Organism ID, Bacteria GROUP B STREP(S.AGALACTIAE)ISOLATED  Final      Susceptibility   Group b strep(s.agalactiae)isolated - MIC*    CLINDAMYCIN >=1 RESISTANT Resistant     AMPICILLIN <=0.25 SENSITIVE Sensitive     ERYTHROMYCIN >=8 RESISTANT Resistant     VANCOMYCIN 0.5 SENSITIVE Sensitive     CEFTRIAXONE <=0.12 SENSITIVE Sensitive     LEVOFLOXACIN 0.5 SENSITIVE Sensitive     PENICILLIN Value in next row Sensitive      SENSITIVE0.06    * GROUP B STREP(S.AGALACTIAE)ISOLATED  Blood Culture ID Panel (Reflexed)     Status: Abnormal   Collection Time: 07/09/20  9:05 PM  Result Value Ref Range Status   Enterococcus faecalis NOT DETECTED NOT DETECTED Final   Enterococcus Faecium NOT DETECTED NOT DETECTED Final   Listeria monocytogenes NOT DETECTED NOT DETECTED Final   Staphylococcus species NOT DETECTED NOT DETECTED Final   Staphylococcus aureus (BCID) NOT DETECTED NOT DETECTED Final   Staphylococcus epidermidis NOT DETECTED NOT DETECTED Final   Staphylococcus lugdunensis NOT DETECTED NOT DETECTED Final   Streptococcus species DETECTED (A) NOT DETECTED Final    Comment: CRITICAL RESULT CALLED TO, READ BACK BY AND VERIFIED WITH: S CHRISTY PHARMD 1631 07/10/20 A BROWNING    Streptococcus agalactiae DETECTED (A) NOT DETECTED Final    Comment: CRITICAL RESULT CALLED TO, READ BACK BY AND VERIFIED WITH: S CHRISTY PHARMD 1631 07/10/20 A BROWNING    Streptococcus pneumoniae NOT DETECTED NOT DETECTED Final   Streptococcus pyogenes NOT DETECTED NOT DETECTED Final   A.calcoaceticus-baumannii NOT DETECTED NOT DETECTED Final    Bacteroides fragilis NOT DETECTED NOT DETECTED Final   Enterobacterales NOT DETECTED NOT DETECTED Final   Enterobacter cloacae complex NOT DETECTED NOT DETECTED Final   Escherichia coli NOT DETECTED NOT DETECTED Final   Klebsiella aerogenes NOT DETECTED NOT DETECTED Final   Klebsiella oxytoca NOT DETECTED NOT DETECTED Final   Klebsiella pneumoniae NOT DETECTED NOT DETECTED Final   Proteus species NOT DETECTED NOT DETECTED Final   Salmonella species NOT DETECTED NOT DETECTED Final   Serratia marcescens NOT DETECTED NOT DETECTED Final   Haemophilus influenzae NOT DETECTED NOT DETECTED Final   Neisseria meningitidis NOT DETECTED NOT DETECTED Final   Pseudomonas aeruginosa NOT DETECTED NOT DETECTED Final   Stenotrophomonas maltophilia NOT DETECTED NOT DETECTED Final   Candida albicans NOT DETECTED NOT DETECTED Final   Candida auris NOT DETECTED NOT DETECTED Final   Candida glabrata NOT DETECTED NOT DETECTED Final   Candida krusei NOT DETECTED NOT DETECTED Final   Candida parapsilosis NOT DETECTED NOT DETECTED Final   Candida tropicalis NOT DETECTED NOT DETECTED Final   Cryptococcus neoformans/gattii NOT DETECTED NOT DETECTED Final    Comment: Performed at [facility], [address], [place], [place] [address]  Resp Panel by RT-PCR (Flu A&B, Covid) Nasopharyngeal Swab     Status: None   Collection Time: 07/09/20  9:06 PM   Specimen: Nasopharyngeal Swab; Nasopharyngeal(NP) swabs in vial transport medium  Result Value Ref Range Status   SARS Coronavirus 2 by RT PCR NEGATIVE NEGATIVE Final    Comment: (NOTE) SARS-CoV-2 target nucleic acids are NOT DETECTED.  The SARS-CoV-2 RNA is generally detectable in upper respiratory specimens during the acute phase of infection. The lowest concentration of SARS-CoV-2 viral copies this assay can detect is 138 copies/mL. A negative result does not preclude SARS-Cov-2 infection  and should not be used as the sole basis for treatment or other  patient management decisions. A negative result may occur with  improper specimen collection/handling, submission of specimen other than nasopharyngeal swab, presence of viral mutation(s) within the areas targeted by this assay, and inadequate number of viral copies(<138 copies/mL). A negative result must be combined with clinical observations, patient history, and epidemiological information. The expected result is Negative.  Fact Sheet for Patients:  EntrepreneurPulse.com.au  Fact Sheet for Healthcare Providers:  IncredibleEmployment.be  This test is no t yet approved or cleared by the [place] FDA and  has been authorized for detection and/or diagnosis of SARS-CoV-2 by FDA under an Emergency Use Authorization (EUA). This EUA will remain  in effect (meaning this test can be used) for the duration of the COVID-19 declaration under Section 564(b)(1) of the Act, 21 U.S.C.section 360bbb-3(b)(1), unless the authorization is terminated  or revoked sooner.       Influenza A by PCR NEGATIVE NEGATIVE Final   Influenza B by PCR NEGATIVE NEGATIVE Final    Comment: (NOTE) The Xpert Xpress SARS-CoV-2/FLU/RSV plus assay is intended as an aid in the diagnosis of influenza from Nasopharyngeal swab specimens and should not be used as a sole basis for treatment. Nasal washings and aspirates are unacceptable for Xpert Xpress SARS-CoV-2/FLU/RSV testing.  Fact Sheet for Patients: EntrepreneurPulse.com.au  Fact Sheet for Healthcare Providers: IncredibleEmployment.be  This test is not yet approved or cleared by the [place] FDA and has been authorized for detection and/or diagnosis of SARS-CoV-2 by FDA under an Emergency Use Authorization (EUA). This EUA will remain in effect (meaning this test can be used) for the duration of the COVID-19 declaration under Section 564(b)(1) of the Act, 21 U.S.C. section  360bbb-3(b)(1), unless the authorization is terminated or revoked.  Performed at [facility], [place] [address] [place], [place] [address]   Body fluid culture     Status: None   Collection Time: 07/09/20 10:00 PM   Specimen: Synovium; Body Fluid  Result Value Ref Range Status   Specimen Description   Final    SYNOVIAL LEFT SHOULDER Performed at [facility] [address], [place], [place] [address]    Special Requests   Final    NONE Performed at [facility], [place] [address], [place], [place] [address]    Gram Stain   Final    MODERATE WBC PRESENT, PREDOMINANTLY PMN NO ORGANISMS SEEN RESULT CALLED TO, READ BACK BY AND VERIFIED WITH: RN J DAVISON AT 2251 07/09/20 CRUICKSHANK A Performed at [facility], [place] [address] [place], [place] [address]    Culture   Final    RARE GROUP B STREP(S.AGALACTIAE)ISOLATED TESTING AGAINST S. AGALACTIAE NOT ROUTINELY PERFORMED DUE TO PREDICTABILITY OF AMP/PEN/VAN SUSCEPTIBILITY. CRITICAL RESULT CALLED TO, READ BACK BY AND VERIFIED WITH: A. MANSFIELD RN, AT [phone] 07/11/20 BY D. VANHOOK REGARDING GROWTH Performed at [facility], [place] [address] [place], [place] [address]    Report Status 07/12/2020 FINAL  Final  Urine culture     Status: Abnormal   Collection Time: 07/10/20  1:00 AM   Specimen: In/Out Cath Urine  Result Value Ref Range Status   Specimen Description   Final    IN/OUT CATH URINE Performed at [facility] [address] [place], [place] [address]    Special Requests   Final    NONE Performed at [facility], [place] [address]  [place], [place] [address]    Culture (A)  Final    10,000 COLONIES/mL PSEUDOMONAS AERUGINOSA 10,000 COLONIES/mL STREPTOCOCCUS AGALACTIAE TESTING AGAINST S. AGALACTIAE NOT ROUTINELY PERFORMED DUE TO PREDICTABILITY OF AMP/PEN/VAN SUSCEPTIBILITY. Performed at [facility],  [place] [address], [place], [place] [address]    Report Status 07/12/2020 FINAL  Final   Organism ID, Bacteria PSEUDOMONAS AERUGINOSA (A)  Final      Susceptibility   Pseudomonas aeruginosa - MIC*    CEFTAZIDIME 2 SENSITIVE Sensitive     CIPROFLOXACIN <=0.25 SENSITIVE Sensitive     GENTAMICIN <=1 SENSITIVE Sensitive     IMIPENEM 2 SENSITIVE Sensitive     PIP/TAZO <=4 SENSITIVE Sensitive     CEFEPIME 2 SENSITIVE Sensitive     * 10,000 COLONIES/mL PSEUDOMONAS AERUGINOSA  MRSA PCR Screening     Status: None   Collection Time: 07/10/20  1:15 AM   Specimen: Nasopharyngeal  Result Value Ref Range Status   MRSA by PCR NEGATIVE NEGATIVE Final    Comment:        The GeneXpert MRSA Assay (FDA approved for NASAL specimens only), is one component of a comprehensive MRSA colonization surveillance program. It is not intended to diagnose MRSA infection nor to guide or monitor treatment for MRSA infections. Performed at [facility], [place] [address], [place], [place] [address]   Culture, blood (routine x 2)     Status: None (Preliminary result)   Collection Time: 07/11/20  9:55 AM   Specimen: BLOOD  Result Value Ref Range Status   Specimen Description   Final    BLOOD LEFT ANTECUBITAL Performed at [facility] [address], [place], [place] [address]    Special Requests   Final    BOTTLES DRAWN AEROBIC ONLY Blood Culture adequate volume Performed at [facility] [address], [place], [place] [address]    Culture   Final    NO GROWTH 4 DAYS Performed at [facility], [place] [address], [place], [place] [address]    Report Status PENDING  Incomplete  Culture, blood (routine x 2)     Status: None (Preliminary result)   Collection Time: 07/11/20 10:01 AM   Specimen: BLOOD LEFT HAND  Result Value Ref Range Status   Specimen Description   Final    BLOOD LEFT HAND Performed at [facility] [address] [place], [place] [address]    Special Requests   Final    BOTTLES DRAWN AEROBIC ONLY Blood Culture adequate volume Performed at [facility] [address], [place], [place] [address]    Culture   Final    NO GROWTH 4 DAYS Performed at [facility], [place] [address] [place], [place] [address]    Report Status PENDING  Incomplete  Body fluid culture     Status: None (Preliminary result)   Collection Time: 07/13/20  8:45 AM   Specimen: Joint, Right Shoulder; Synovial Fluid  Result Value Ref Range Status   Specimen Description   Final    JOINT FLUID Performed at [facility], [place] [address] [place], [place] [address]    Special Requests   Final    RIGHT SHOULDER Performed at [facility] [address] [place], [place] [address]    Gram Stain   Final    RARE WBC PRESENT, PREDOMINANTLY MONONUCLEAR NO ORGANISMS SEEN    Culture   Final    RARE  PSEUDOMONAS AERUGINOSA CULTURE REINCUBATED FOR BETTER GROWTH Performed at [facility], [place] [address], [place], [place] [address]    Report Status PENDING  Incomplete    Studies/Results: MR SHOULDER RIGHT WO CONTRAST  Result Date: 07/15/2020 CLINICAL DATA:  Right shoulder pain. History of septic right shoulder joint. Status post washout, most recently 07/10/2020. EXAM: MRI OF THE RIGHT SHOULDER WITHOUT CONTRAST TECHNIQUE: Multiplanar, multisequence MR imaging of the right shoulder was performed. No intravenous contrast was administered. COMPARISON:  CT right shoulder 06/18/2020. FINDINGS: No sagittal T2 weighted series is provided. The patient refused additional scanning. Bones/Joint/Cartilage As on the prior CT scan, the glenoid is markedly thinned and remodeled. There is marrow edema in the subarticular glenoid and glenoid neck. Patchy marrow edema is also seen in the humeral head and neck. Erosions in the lateral periphery of the humeral head are also again identified.  There is some susceptibility artifact in the posterior greater tuberosity. The patient has a large glenohumeral joint which extravasates through massive rotator cuff tears into the subacromial/subdeltoid bursa. Structure thought to represent the superior labrum is detached from the glenoid and lying in the glenoid fossa. No normal-appearing labrum is identified. The acromioclavicular joint appears intact but there is destructive change about it and marrow edema in both the clavicular head and acromion. Ligaments Negative. Muscles and Tendons Massive rotator cuff tears are noted. There is fatty atrophy of musculature about the shoulder. A fluid collection in the subscapularis muscle belly measuring 1.6 cm transverse x 1 cm AP x 1 cm craniocaudal is identified. Fluid also dissects out of the glenohumeral joint into the infraspinatus muscle belly. Musculature about the shoulder demonstrates increased T2 signal. Soft tissues Extensive edema is seen in the axillary recess and inferior to the structure thought to represent the inferior glenohumeral ligament. Below the inferior glenohumeral ligament, there is a heterogeneous fluid collection measuring approximately 3.3 cm AP by at least 2 cm craniocaudal by 1.6 cm transverse. IMPRESSION: Very large glenohumeral joint effusion extravasating into the subacromial/subdeltoid bursa and patchy marrow edema in the humeral head and neck are consistent with a septic glenohumeral joint and osteomyelitis. Marrow edema and destructive change about the acromioclavicular joint most compatible with septic joint and osteomyelitis. Edema in rotator cuff muscles most worrisome for myositis. Small fluid collection in the subscapularis is worrisome for pyomyositis. Marked edema in the axilla. A fluid collection which appears to be inferior to the inferior glenohumeral ligament is incompletely visualized but worrisome for soft tissue abscess. Massive rotator cuff tear. Electronically Signed    By: Inge Rise M.D.   On: 07/15/2020 16:18   [place] EKG SITE RITE  Result Date: 07/14/2020 If Site Rite image not attached, placement could not be confirmed due to current cardiac rhythm.     Assessment/Plan:  INTERVAL HISTORY: RIGHT SHOULDER CX W PSEUDOMONAS  Active Problems:   Peripheral artery disease (HCC)   Hx of CABG   Septic shock (HCC)   Shortness of breath   Status post aortobifemoral bypass surgery   History of carotid endarterectomy   Type 2 diabetes mellitus with hyperlipidemia (HCC)   Diabetes mellitus type II, non insulin dependent (HCC)   Acute respiratory failure (HCC)   Pyogenic arthritis of left shoulder region [facility])   Acute pain of right shoulder   Sepsis due to group B Streptococcus ([place])    Tige Kaceton Vieau is a 74 y.o. male with Group B streptococcal bacteremia and septic shock due to left septic shoulder sp I  and D. He has had multiple shoulder aspirations with elevated WBC in them. He also had a right shoulder aspirate performed yesterday with 16900 WBC, 97% PMNs.   It is now growing  PSEUDOMONAS--alerted by Nicoletta Dress this ame to this  #1 GBS bacteremia with LEFT septic shoulder  Changing to cefepime   #2 Right SEPTIC shoulder with Pseudomonas  --patient needs I and D of right shoulder w Orthopedic surgery  Switch to cefepime and followup sensis   LOS: 7 days   Alcide Evener 07/16/2020, 7:05 AM

## 2020-07-16 NOTE — Progress Notes (Signed)
Pt refused transport to IR to get PICC placed. Nurse explained importance of procedure. Physician was called to bedside and is aware of patients refusal.  Charge RN also explained to patient the importance of procedure. Patient continued to refuse. Patient requested  to call the caregiver to provide updates.

## 2020-07-16 NOTE — Progress Notes (Signed)
Physical Therapy Treatment Patient Details Name: [name] MRN: [id number] DOB: 28-Dec-1945 Today's Date: 07/16/2020    History of Present Illness 73 year old male presents 07/10/20 from home with generalized weakness, fever, found down by friend. Patient has chronic left shoulder problems requiring fluid drained off, by orthopedics, most recently 1 to 2 weeks ago. S/P arthroscopy and washout of Left shoulder, scheduled I&D R shoulder 12/2. 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    PT Comments    The patient  Very anxious to get OOB, impuslive. Patient required  Assistance to fully sit up as unable to functionally use UE's to reach/push up to sitting.  Steady assistance to stand from bed with 2 arm supports as patient cannot use a RW due to bilateral shoulder dysfunction.  Patient took small steps to move to recliner, noted  Right knee flexed and decreased ability to take a step. Patient did not report worsening dizziness being first time out of bed since admission.  Continue PT for progressive mobility  Follow Up Recommendations  SNF     Equipment Recommendations  None recommended by PT    Recommendations for Other Services       Precautions / Restrictions Precautions Precautions: Fall Precaution Comments: painful B shoulders, rotator cuff tear/septic osteomylitis R, Left shoulder with dressing. use his shoes. Restrictions RUE Weight Bearing: Weight bearing as tolerated LUE Weight Bearing: Weight bearing as tolerated    Mobility  Bed Mobility Overal bed mobility: Needs Assistance Bed Mobility: Supine to Sit     Supine to sit: Mod assist;HOB elevated;+2 for physical assistance;+2 for safety/equipment     General bed  mobility comments: mod A x2 for trunk support due to limited UE use  Transfers Overall transfer level: Needs assistance Equipment used: 2 person hand held assist Transfers: Sit to/from [facility] Sit to Stand: Mod assist;+2 physical assistance;+2 safety/equipment Stand pivot transfers: Mod assist;+2 physical assistance;+2 safety/equipment       General transfer comment: patient reports he does better with shoes on, right  knee flexed, small shuffle steps, did not completely get turned to recliner, decreased control of descent  Ambulation/Gait                 Stairs             Wheelchair Mobility    Modified Rankin (Stroke Patients Only)       Balance Overall balance assessment: Needs assistance Sitting-balance support: Feet supported Sitting balance-Leahy Scale: Fair     Standing balance support: Bilateral upper extremity supported Standing balance-Leahy Scale: Poor Standing balance comment: reliant on external support of therapists                            Cognition Arousal/Alertness: Awake/alert Behavior During Therapy: Impulsive Overall Cognitive Status: No family/caregiver present to determine baseline cognitive functioning Area of Impairment: Memory;Safety/judgement;Orientation                 Orientation Level: Time   Memory: Decreased short-term memory   Safety/Judgement: Decreased awareness of safety     General Comments: does not recall how long has been in hospital, does attempt to get OOB/stand before therapy ready, difficulty recalling timeline of events  Exercises      General Comments        Pertinent Vitals/Pain Pain Assessment: Faces Faces Pain Scale: Hurts even more Pain Location: right shoulder Pain Descriptors / Indicators: Grimacing;Guarding Pain Intervention(s): Monitored during session;Limited activity within patient's tolerance;Repositioned    Home Living Family/patient  expects to be discharged to:: Private residence Living Arrangements: Alone Available Help at Discharge: Available PRN/intermittently Type of Home: House Home Access: Stairs to enter Entrance Stairs-Rails: Left;Right Home Layout: One level Home Equipment: Environmental consultant - 2 wheels;Wheelchair - manual;Cane - single point      Prior Function Level of Independence: Independent with assistive device(s)      Comments: Mobility at times with wheelchair in the home.   PT Goals (current goals can now be found in the care plan section) Acute Rehab PT Goals Patient Stated Goal: get out of bed  Progress towards PT goals: Progressing toward goals    Frequency    Min 2X/week      PT Plan Current plan remains appropriate    Co-evaluation PT/OT/SLP Co-Evaluation/Treatment: Yes Reason for Co-Treatment: For patient/therapist safety;To address functional/ADL transfers PT goals addressed during session: Mobility/safety with mobility OT goals addressed during session: ADL's and self-care      AM-PAC PT "6 Clicks" Mobility   Outcome Measure  Help needed turning from your back to your side while in a flat bed without using bedrails?: A Lot Help needed moving from lying on your back to sitting on the side of a flat bed without using bedrails?: A Lot Help needed moving to and from a bed to a chair (including a wheelchair)?: Total Help needed standing up from a chair using your arms (e.g., wheelchair or bedside chair)?: Total Help needed to walk in hospital room?: Total Help needed climbing 3-5 steps with a railing? : Total 6 Click Score: 8    End of Session Equipment Utilized During Treatment: Gait belt Activity Tolerance: Patient tolerated treatment well;Patient limited by fatigue Patient left: in chair;with call bell/phone within reach;with chair alarm set Nurse Communication: Mobility status;Need for lift equipment PT Visit Diagnosis: Unsteadiness on feet (R26.81)     Time: 9798-9211 PT  Time Calculation (min) (ACUTE ONLY): 32 min  Charges:  $Therapeutic Activity: 8-22 mins                     Tresa Endo PT Acute Rehabilitation Services Pager [phone] Office [phone]    Claretha Cooper 07/16/2020, 2:59 PM

## 2020-07-16 NOTE — Progress Notes (Signed)
Progress Note        PROGRESS NOTE    [name]  BJS:[id number] DOB: [birth year] DOA: 07/09/2020 PCP: Doreatha Lew, MD   Brief Narrative:  [name] is a 74 y.o. male History of DM, PAD, CAD s/p CABG. Presented tot he ED with weakness and fever. The patient was found to be in septic shock  (group B streptococcal bacteremia) due to a septic left shoulder.  He is s/p arthroscopic I & D and washout on 07/10/20.  Team is concern for a septic right shoulder due to limited ROM and tenderness. Left septic shoulder aspirate grew group D strep on right shoulder culture grew Pseudomonas.  Patient currently on cefepime.  ID on board. Patient was supposed to get tunneled central catheter for long-term antibiotics therapy but refused to have the procedure done today.  Discussed with IR and plan for Friday.  Ortho planning for more surgical exploration of right and left shoulders in the OR tomorrow.   Assessment & Plan:   Active Problems:   Peripheral artery disease (HCC)   Hx of CABG   Septic shock (HCC)   Shortness of breath   Status post aortobifemoral bypass surgery   History of carotid endarterectomy   Type 2 diabetes mellitus with hyperlipidemia (HCC)   Diabetes mellitus type II, non insulin dependent (HCC)   Acute respiratory failure (HCC)   Pyogenic arthritis of left shoulder region [facility])   Acute pain of right shoulder   Sepsis due to group B Streptococcus ([place])  Clinical problems list 1.  Sepsis with septic shock secondary to septic left shoulder with GBS bacteremia, present on admission. Septic right shoulder with Pseudomonas bacteremia He is status post arthroscopic drainage/washout by Ortho Patient was on high-dose penicillin.  ID to and on 08/21/2020 Cefepime added to antibiotics per ID Ortho plans for surgical exploration of both shoulders tomorrow in the OR. Septic shock has resolved PT on board with ambulation out of bed to chair and home if  possible Maintain fall precaution  2.  Benign prostatic hyperplasia with urinary symptoms Patient has indwelling Foley catheter Continue with Flomax   3.  NSTEMI with detectable troponin likely due to demand ischemia from ongoing sepsis with septic shock.  Troponin peaked and trending down.  Patient has no chest pain.  EKG was negative for any acute ischemic event. Cardiology evaluated and recommended continue with aspirin and Brilinta.  3.  Chronic obstructive pulmonary disease.  Not in acute exacerbation Continue with bronchodilators as needed and oxygen supplementation if necessary.  4.  Anemia of chronic disease requiring blood transfusion. Patient status post 1 unit of packed red blood cell transfusion on 07/12/2020.  No active bleeding identified. Continue to monitor CBC and transfuse if hemoglobin is less than 7.0.  5.  Diabetes mellitus type 2.  Last hemoglobin A1c on 05/12/2020 was 7.2 Continue with sliding scale insulin Fingersticks before meals and at bedtime Maintain hypoglycemic protocol and diabetic diet  6.  Coronary artery disease Peripheral artery disease status post aortobifemoral bypass by Dr. Trula Slade in 2012. 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  Hyperlipidemia Continue with PCSK9 inhibitor    DVT prophylaxis:  Lovenox subacute Code Status: Full  Family Communication: None at bedside  Disposition Plan: Patient has significant comorbidities with bilateral septic shoulder that would require drainages by Ortho and prolonged antibiotic therapy. ID has projected antibiotic therapy to 08/21/2020. Patient is  not clinically stable for discharge at this time.   Consultants:  Infectious disease Orthopedics   Procedures:  Left shoulder arthroscopic incision and drainage 2D echocardiogram with EF of 50 to 55% Planned for right shoulder surgical exploration on  07/17/2020   Antimicrobials: Vancomycin 11/24 through 11/26 Rocephin 11/24, then 11/26 Cefepime 11/25-11/26 Penicillin G 11/27 >> Cefepime 12/1>>    Subjective: Patient was seen and evaluated at bedside.  He is alert and oriented and not in acute distress.  Patient requesting to go home.  Refused to have central line placed by IR.  PT in the room to ambulate patient. Culture from right shoulder growing Pseudomonas.  ID added cefepime to therapy.  Objective: Vitals:   07/16/20 0500 07/16/20 0600 07/16/20 0800 07/16/20 1200  BP:   (!) 143/47   Pulse: 82 85 93   Resp: 14 15 14    Temp:   (!) 97.5 F (36.4 C) 98.8 F (37.1 C)  TempSrc:   Oral Oral  SpO2: 99% 95% 96%   Weight:      Height:        Intake/Output Summary (Last 24 hours) at 07/16/2020 1423 Last data filed at 07/16/2020 0603 Gross per 24 hour  Intake 885.07 ml  Output 1550 ml  Net -664.93 ml   Filed Weights   07/09/20 2017 07/10/20 0130 07/12/20 0515  Weight: 86.2 kg 92.5 kg 91 kg    Examination:  General exam: Appears calm and comfortable.  He is alert and not in acute distress Respiratory system: Clear to auscultation. Respiratory effort normal. Cardiovascular system: S1 & S2 heard, positive murmur, no rubs, gallops or clicks. No pedal edema. Gastrointestinal system: Abdomen is nondistended, soft and nontender. No organomegaly or masses felt. Normal bowel sounds heard. Central nervous system: Alert and oriented. No focal neurological deficits. Extremities: Symmetric 5 x 5 power bilateral lower extremity.  Diminished motion bilateral right and status post left shoulder arthroscopic I&D. Skin: No rashes, lesions or ulcers Psychiatry: Judgement and insight appear normal. Mood & affect appropriate.     Data Reviewed: I have personally reviewed following labs and imaging studies  CBC: Recent Labs  Lab 07/09/20 2105 07/09/20 2105 07/10/20 0253 07/10/20 0253 07/11/20 0425 07/12/20 0422 07/12/20 1704  07/13/20 0603 07/14/20 0420  WBC 11.6*   < > 10.1  --  10.4 9.2  --  11.5* 9.4  NEUTROABS 9.8*  --   --   --  8.1*  --   --   --   --   HGB 10.5*   < > 9.7*   < > 9.1* 7.9* 9.2* 10.2* 9.8*  HCT 32.0*   < > 29.5*   < > 27.7* 23.2* 27.9* 30.8* 29.1*  MCV 88.4   < > 90.8  --  88.5 86.2  --  87.5 86.6  PLT 360   < > 311  --  355 320  --  413* 403*   < > = values in this interval not displayed.   Basic Metabolic Panel: Recent Labs  Lab 07/11/20 0425 07/12/20 0422 07/12/20 1704 07/13/20 0603 07/14/20 0420  NA 129* 131* 130* 131* 134*  K 4.3 3.7 3.8 3.6 3.6  CL 100 98 97* 97* 96*  CO2 19* 24 22 23 28   GLUCOSE 143* 131* 181* 175* 165*  BUN 13 17 22 23 22   CREATININE 0.60* 0.58* 0.78 0.73 0.66  CALCIUM 8.1* 7.9* 8.1* 8.3* 8.6*  MG 1.8 1.9  --   --   --  GFR: Estimated Creatinine Clearance: 88.9 mL/min (by C-G formula based on SCr of 0.66 mg/dL). Liver Function Tests: Recent Labs  Lab 07/09/20 2105 07/10/20 0253 07/11/20 0425 07/14/20 0420  AST 19 15 20  67*  ALT 16 13 13  34  ALKPHOS 125 106 113  --   BILITOT 0.8 0.6 1.0  --   PROT 7.8 6.2* 6.4*  --   ALBUMIN 3.2* 2.6* 2.6*  --    No results for input(s): LIPASE, AMYLASE in the last 168 hours. No results for input(s): AMMONIA in the last 168 hours. Coagulation Profile: Recent Labs  Lab 07/09/20 2105  INR 1.3*   Cardiac Enzymes: No results for input(s): CKTOTAL, CKMB, CKMBINDEX, TROPONINI in the last 168 hours. BNP (last 3 results) No results for input(s): PROBNP in the last 8760 hours. HbA1C: No results for input(s): HGBA1C in the last 72 hours. CBG: Recent Labs  Lab 07/15/20 1547 07/15/20 1941 07/16/20 0559 07/16/20 0741 07/16/20 1157  GLUCAP 173* 169* 150* 148* 145*   Lipid Profile: No results for input(s): CHOL, HDL, LDLCALC, TRIG, CHOLHDL, LDLDIRECT in the last 72 hours. Thyroid Function Tests: No results for input(s): TSH, T4TOTAL, FREET4, T3FREE, THYROIDAB in the last 72 hours. Anemia Panel: No  results for input(s): VITAMINB12, FOLATE, FERRITIN, TIBC, IRON, RETICCTPCT in the last 72 hours. Sepsis Labs: Recent Labs  Lab 07/09/20 2105 07/09/20 2326  LATICACIDVEN 1.6 1.2    Recent Results (from the past 240 hour(s))  Blood Culture (routine x 2)     Status: None   Collection Time: 07/09/20  9:05 PM   Specimen: BLOOD LEFT FOREARM  Result Value Ref Range Status   Specimen Description   Final    BLOOD LEFT FOREARM Performed at [facility], [place] [address] [place], [place] [address]    Special Requests   Final    BOTTLES DRAWN AEROBIC AND ANAEROBIC Blood Culture results may not be optimal due to an excessive volume of blood received in culture bottles Performed at [place] [address], [place], [place] [address]    Culture   Final    NO GROWTH 5 DAYS Performed at [facility], [place] [address], [place], [place] [address]    Report Status 07/15/2020 FINAL  Final  Blood Culture (routine x 2)     Status: Abnormal   Collection Time: 07/09/20  9:05 PM   Specimen: BLOOD RIGHT FOREARM  Result Value Ref Range Status   Specimen Description   Final    BLOOD RIGHT FOREARM Performed at [facility] [address] [place], [place] [address]    Special Requests   Final    BOTTLES DRAWN AEROBIC AND ANAEROBIC Blood Culture adequate volume Performed at [facility] [address] [place], [place] [address]    Culture  Setup Time   Final    GRAM POSITIVE COCCI IN CHAINS IN BOTH AEROBIC AND ANAEROBIC BOTTLES CRITICAL RESULT CALLED TO, READ BACK BY AND VERIFIED WITHDomenick Gong PHARMD 2297 07/10/20 A BROWNING Performed at [facility], [place] [address] [place], [place] [address]    Culture GROUP B STREP(S.AGALACTIAE)ISOLATED (A)  Final   Report Status 07/12/2020 FINAL  Final   Organism ID, Bacteria GROUP B STREP(S.AGALACTIAE)ISOLATED  Final      Susceptibility   Group b  strep(s.agalactiae)isolated - MIC*    CLINDAMYCIN >=1 RESISTANT Resistant     AMPICILLIN <=0.25 SENSITIVE Sensitive     ERYTHROMYCIN >=8 RESISTANT  Resistant     VANCOMYCIN 0.5 SENSITIVE Sensitive     CEFTRIAXONE <=0.12 SENSITIVE Sensitive     LEVOFLOXACIN 0.5 SENSITIVE Sensitive     PENICILLIN Value in next row Sensitive      SENSITIVE0.06    * GROUP B STREP(S.AGALACTIAE)ISOLATED  Blood Culture ID Panel (Reflexed)     Status: Abnormal   Collection Time: 07/09/20  9:05 PM  Result Value Ref Range Status   Enterococcus faecalis NOT DETECTED NOT DETECTED Final   Enterococcus Faecium NOT DETECTED NOT DETECTED Final   Listeria monocytogenes NOT DETECTED NOT DETECTED Final   Staphylococcus species NOT DETECTED NOT DETECTED Final   Staphylococcus aureus (BCID) NOT DETECTED NOT DETECTED Final   Staphylococcus epidermidis NOT DETECTED NOT DETECTED Final   Staphylococcus lugdunensis NOT DETECTED NOT DETECTED Final   Streptococcus species DETECTED (A) NOT DETECTED Final    Comment: CRITICAL RESULT CALLED TO, READ BACK BY AND VERIFIED WITH: S CHRISTY PHARMD 1631 07/10/20 A BROWNING    Streptococcus agalactiae DETECTED (A) NOT DETECTED Final    Comment: CRITICAL RESULT CALLED TO, READ BACK BY AND VERIFIED WITH: S CHRISTY PHARMD 1631 07/10/20 A BROWNING    Streptococcus pneumoniae NOT DETECTED NOT DETECTED Final   Streptococcus pyogenes NOT DETECTED NOT DETECTED Final   A.calcoaceticus-baumannii NOT DETECTED NOT DETECTED Final   Bacteroides fragilis NOT DETECTED NOT DETECTED Final   Enterobacterales NOT DETECTED NOT DETECTED Final   Enterobacter cloacae complex NOT DETECTED NOT DETECTED Final   Escherichia coli NOT DETECTED NOT DETECTED Final   Klebsiella aerogenes NOT DETECTED NOT DETECTED Final   Klebsiella oxytoca NOT DETECTED NOT DETECTED Final   Klebsiella pneumoniae NOT DETECTED NOT DETECTED Final   Proteus species NOT DETECTED NOT DETECTED Final   Salmonella species NOT DETECTED NOT  DETECTED Final   Serratia marcescens NOT DETECTED NOT DETECTED Final   Haemophilus influenzae NOT DETECTED NOT DETECTED Final   Neisseria meningitidis NOT DETECTED NOT DETECTED Final   Pseudomonas aeruginosa NOT DETECTED NOT DETECTED Final   Stenotrophomonas maltophilia NOT DETECTED NOT DETECTED Final   Candida albicans NOT DETECTED NOT DETECTED Final   Candida auris NOT DETECTED NOT DETECTED Final   Candida glabrata NOT DETECTED NOT DETECTED Final   Candida krusei NOT DETECTED NOT DETECTED Final   Candida parapsilosis NOT DETECTED NOT DETECTED Final   Candida tropicalis NOT DETECTED NOT DETECTED Final   Cryptococcus neoformans/gattii NOT DETECTED NOT DETECTED Final    Comment: Performed at [facility], [address] [place], [place] [address]  Resp Panel by RT-PCR (Flu A&B, Covid) Nasopharyngeal Swab     Status: None   Collection Time: 07/09/20  9:06 PM   Specimen: Nasopharyngeal Swab; Nasopharyngeal(NP) swabs in vial transport medium  Result Value Ref Range Status   SARS Coronavirus 2 by RT PCR NEGATIVE NEGATIVE Final    Comment: (NOTE) SARS-CoV-2 target nucleic acids are NOT DETECTED.  The SARS-CoV-2 RNA is generally detectable in upper respiratory specimens during the acute phase of infection. The lowest concentration of SARS-CoV-2 viral copies this assay can detect is 138 copies/mL. A negative result does not preclude SARS-Cov-2 infection and should not be used as the sole basis for treatment or other patient management decisions. A negative result may occur with  improper specimen collection/handling, submission of specimen other than nasopharyngeal swab, presence of viral mutation(s) within the areas targeted by this assay, and inadequate number of viral copies(<138 copies/mL). A negative result must be combined with clinical observations, patient history, and epidemiological  information. The expected result is Negative.  Fact Sheet for Patients:   EntrepreneurPulse.com.au  Fact Sheet for Healthcare Providers:  IncredibleEmployment.be  This test is no t yet approved or cleared by the [place] FDA and  has been authorized for detection and/or diagnosis of SARS-CoV-2 by FDA under an Emergency Use Authorization (EUA). This EUA will remain  in effect (meaning this test can be used) for the duration of the COVID-19 declaration under Section 564(b)(1) of the Act, 21 U.S.C.section 360bbb-3(b)(1), unless the authorization is terminated  or revoked sooner.       Influenza A by PCR NEGATIVE NEGATIVE Final   Influenza B by PCR NEGATIVE NEGATIVE Final    Comment: (NOTE) The Xpert Xpress SARS-CoV-2/FLU/RSV plus assay is intended as an aid in the diagnosis of influenza from Nasopharyngeal swab specimens and should not be used as a sole basis for treatment. Nasal washings and aspirates are unacceptable for Xpert Xpress SARS-CoV-2/FLU/RSV testing.  Fact Sheet for Patients: EntrepreneurPulse.com.au  Fact Sheet for Healthcare Providers: IncredibleEmployment.be  This test is not yet approved or cleared by the [place] FDA and has been authorized for detection and/or diagnosis of SARS-CoV-2 by FDA under an Emergency Use Authorization (EUA). This EUA will remain in effect (meaning this test can be used) for the duration of the COVID-19 declaration under Section 564(b)(1) of the Act, 21 U.S.C. section 360bbb-3(b)(1), unless the authorization is terminated or revoked.  Performed at [facility], [place] [address], [place], [place] [address]   Body fluid culture     Status: None   Collection Time: 07/09/20 10:00 PM   Specimen: Synovium; Body Fluid  Result Value Ref Range Status   Specimen Description   Final    SYNOVIAL LEFT SHOULDER Performed at [facility] [address], [place], [place] [address]    Special  Requests   Final    NONE Performed at [facility], [place] [address] [place], [place] [address]    Gram Stain   Final    MODERATE WBC PRESENT, PREDOMINANTLY PMN NO ORGANISMS SEEN RESULT CALLED TO, READ BACK BY AND VERIFIED WITH: RN J DAVISON AT 2251 07/09/20 CRUICKSHANK A Performed at [facility], [place] [address] [place], [place] [address]    Culture   Final    RARE GROUP B STREP(S.AGALACTIAE)ISOLATED TESTING AGAINST S. AGALACTIAE NOT ROUTINELY PERFORMED DUE TO PREDICTABILITY OF AMP/PEN/VAN SUSCEPTIBILITY. CRITICAL RESULT CALLED TO, READ BACK BY AND VERIFIED WITH: A. MANSFIELD RN, AT 6301 07/11/20 BY D. VANHOOK REGARDING GROWTH Performed at [facility], [place] [address], [place], [place] [address]    Report Status 07/12/2020 FINAL  Final  Urine culture     Status: Abnormal   Collection Time: 07/10/20  1:00 AM   Specimen: In/Out Cath Urine  Result Value Ref Range Status   Specimen Description   Final    IN/OUT CATH URINE Performed at [place] [address], [place], [place] [address]    Special Requests   Final    NONE Performed at [facility], [place] [address] [place], [place] [address]    Culture (A)  Final    10,000 COLONIES/mL PSEUDOMONAS AERUGINOSA 10,000 COLONIES/mL STREPTOCOCCUS AGALACTIAE TESTING AGAINST S. AGALACTIAE NOT ROUTINELY PERFORMED DUE TO PREDICTABILITY OF AMP/PEN/VAN SUSCEPTIBILITY. Performed at [facility], [place] [address], [place], [place] [address]    Report Status 07/12/2020 FINAL  Final   Organism ID, Bacteria PSEUDOMONAS AERUGINOSA (A)  Final      Susceptibility   Pseudomonas aeruginosa - MIC*    CEFTAZIDIME 2 SENSITIVE Sensitive     CIPROFLOXACIN <=0.25 SENSITIVE Sensitive     GENTAMICIN <=1 SENSITIVE Sensitive     IMIPENEM 2 SENSITIVE Sensitive     PIP/TAZO <=4 SENSITIVE Sensitive     CEFEPIME 2 SENSITIVE Sensitive     * 10,000 COLONIES/mL PSEUDOMONAS  AERUGINOSA  MRSA PCR Screening     Status: None   Collection Time: 07/10/20  1:15 AM   Specimen: Nasopharyngeal  Result Value Ref Range Status   MRSA by PCR NEGATIVE NEGATIVE Final    Comment:        The GeneXpert MRSA Assay (FDA approved for NASAL specimens only), is one component of a comprehensive MRSA colonization surveillance program. It is not intended to diagnose MRSA infection nor to guide or monitor treatment for MRSA infections. Performed at [facility], [place] [address], [place], [place] [address]   Culture, blood (routine x 2)     Status: None   Collection Time: 07/11/20  9:55 AM   Specimen: BLOOD  Result Value Ref Range Status   Specimen Description   Final    BLOOD LEFT ANTECUBITAL Performed at [facility] [address], [place], [place] [address]    Special Requests   Final    BOTTLES DRAWN AEROBIC ONLY Blood Culture adequate volume Performed at [facility] [address] [place], [place] [address]    Culture   Final    NO GROWTH 5 DAYS Performed at [facility], [place] [address] [place], [place] [address]    Report Status 07/16/2020 FINAL  Final  Culture, blood (routine x 2)     Status: None   Collection Time: 07/11/20 10:01 AM   Specimen: BLOOD LEFT HAND  Result Value Ref Range Status   Specimen Description   Final    BLOOD LEFT HAND Performed at [facility] [address], [place], [place] [address]    Special Requests   Final    BOTTLES DRAWN AEROBIC ONLY Blood Culture adequate volume Performed at [facility] [address], [place], [place] [address]    Culture   Final    NO GROWTH 5 DAYS Performed at [facility], [place] [address] [place], [place] [address]    Report Status 07/16/2020 FINAL  Final  Body fluid culture     Status: None (Preliminary result)   Collection Time: 07/13/20  8:45 AM   Specimen: Joint, Right Shoulder;  Synovial Fluid  Result Value Ref Range Status   Specimen Description   Final    JOINT FLUID Performed at [facility], [place] [address] [place], [place] [address]    Special Requests   Final    RIGHT SHOULDER Performed at [place] [address] [place], [place] [address]    Gram Stain   Final    RARE WBC PRESENT, PREDOMINANTLY MONONUCLEAR NO ORGANISMS SEEN    Culture   Final    RARE PSEUDOMONAS AERUGINOSA SUSCEPTIBILITIES TO FOLLOW Performed at [facility], [place] [address], [place], [place] [address]    Report Status PENDING  Incomplete         Radiology Studies: MR SHOULDER RIGHT WO CONTRAST  Result Date: 07/15/2020 CLINICAL DATA:  Right shoulder pain. History of septic right shoulder joint. Status post washout, most recently 07/10/2020. EXAM: MRI OF THE  RIGHT SHOULDER WITHOUT CONTRAST TECHNIQUE: Multiplanar, multisequence MR imaging of the right shoulder was performed. No intravenous contrast was administered. COMPARISON:  CT right shoulder 06/18/2020. FINDINGS: No sagittal T2 weighted series is provided. The patient refused additional scanning. Bones/Joint/Cartilage As on the prior CT scan, the glenoid is markedly thinned and remodeled. There is marrow edema in the subarticular glenoid and glenoid neck. Patchy marrow edema is also seen in the humeral head and neck. Erosions in the lateral periphery of the humeral head are also again identified. There is some susceptibility artifact in the posterior greater tuberosity. The patient has a large glenohumeral joint which extravasates through massive rotator cuff tears into the subacromial/subdeltoid bursa. Structure thought to represent the superior labrum is detached from the glenoid and lying in the glenoid fossa. No normal-appearing labrum is identified. The acromioclavicular joint appears intact but there is destructive change about it and marrow edema in both the clavicular head and  acromion. Ligaments Negative. Muscles and Tendons Massive rotator cuff tears are noted. There is fatty atrophy of musculature about the shoulder. A fluid collection in the subscapularis muscle belly measuring 1.6 cm transverse x 1 cm AP x 1 cm craniocaudal is identified. Fluid also dissects out of the glenohumeral joint into the infraspinatus muscle belly. Musculature about the shoulder demonstrates increased T2 signal. Soft tissues Extensive edema is seen in the axillary recess and inferior to the structure thought to represent the inferior glenohumeral ligament. Below the inferior glenohumeral ligament, there is a heterogeneous fluid collection measuring approximately 3.3 cm AP by at least 2 cm craniocaudal by 1.6 cm transverse. IMPRESSION: Very large glenohumeral joint effusion extravasating into the subacromial/subdeltoid bursa and patchy marrow edema in the humeral head and neck are consistent with a septic glenohumeral joint and osteomyelitis. Marrow edema and destructive change about the acromioclavicular joint most compatible with septic joint and osteomyelitis. Edema in rotator cuff muscles most worrisome for myositis. Small fluid collection in the subscapularis is worrisome for pyomyositis. Marked edema in the axilla. A fluid collection which appears to be inferior to the inferior glenohumeral ligament is incompletely visualized but worrisome for soft tissue abscess. Massive rotator cuff tear. Electronically Signed   By: Inge Rise M.D.   On: 07/15/2020 16:18   [place] EKG SITE RITE  Result Date: 07/14/2020 If Site Rite image not attached, placement could not be confirmed due to current cardiac rhythm.       Scheduled Meds:  aspirin EC  81 mg Oral Daily   buPROPion  300 mg Oral Daily   carvedilol  6.25 mg Oral BID WC   Chlorhexidine Gluconate Cloth  6 each Topical Daily   docusate sodium  100 mg Oral BID   enoxaparin (LOVENOX) injection  40 mg Subcutaneous Q24H   gabapentin  400  mg Oral Daily   gabapentin  800 mg Oral QHS   insulin aspart  0-15 Units Subcutaneous Q4H   mouth rinse  15 mL Mouth Rinse q12n4p   mouth rinse  15 mL Mouth Rinse BID   pantoprazole  40 mg Oral Daily   rOPINIRole  1 mg Oral TID   tamsulosin  0.4 mg Oral Daily   ticagrelor  90 mg Oral BID   Continuous Infusions:  sodium chloride Stopped (07/10/20 0748)   ceFEPime (MAXIPIME) IV Stopped (07/16/20 1127)     LOS: 7 days    Time spent: [place], MD [facility] Pager [phone]  If 7PM-7AM, please contact night-coverage [email or web address] Password  [email or web address] 07/16/2020, 2:23 PM

## 2020-07-16 NOTE — Progress Notes (Addendum)
[name]  MRN: [id number] DOB/Age: 74-Oct-1947 74 y.o. [facility] Procedure: Procedure(s) (LRB): SHOULDER ARTHROSCOPY WITH WASHOUT (Left)     Subjective:  We received a call from Dr. [name] regarding the updated cultures with Pseudomonas now growing out of his right shoulder.  Patient states that his left shoulder feels some improvement since arthroscopic lavage.  His right shoulder continues to be quite painful with any motion. Vital Signs Temp:  [97.5 F (36.4 C)-98.1 F (36.7 C)] 97.5 F (36.4 C) (12/01 0800) Pulse Rate:  [74-93] 93 (12/01 0800) Resp:  [14-20] 14 (12/01 0800) BP: (113-143)/(34-77) 143/47 (12/01 0800) SpO2:  [94 %-100 %] 96 % (12/01 0800)  Lab Results Recent Labs    07/14/20 0420  WBC 9.4  HGB 9.8*  HCT 29.1*  PLT 403*   BMET Recent Labs    07/14/20 0420  NA 134*  K 3.6  CL 96*  CO2 28  GLUCOSE 165*  BUN 22  CREATININE 0.66  CALCIUM 8.6*   INR  Date Value Ref Range Status  07/09/2020 1.3 (H) 0.8 - 1.2 Final    Comment:    (NOTE) INR goal varies based on device and disease states. Performed at [facility], [place] [address] [place], [place] [address]      Exam Bilateral shoulders are examined.  The right shoulder does show a moderate underlying effusion.  There is no gross erythema.  It is very painful to any active or passive range of motion.  His left shoulder shows the sutures to be in place with just some scant bloody drainage along the portal sites.  He has a gross effusion noted but again no erythema no current active drainage.  Moderate pain on passive range of motion today.        Plan 1. septic left shoulder status post arthroscopic lavage with ongoing large joint effusion 2.  Septic right shoulder with recent aspirate now growing out Pseudomonas  I have discussed his current situation with the patient and we have discussed treatment options also with Dr. [name].  We will make  arrangements for a formal arthroscopic lavage of the right shoulder for treatment of the positive culture with Pseudomonas.  We have ongoing concerns regarding his continued swelling about the left shoulder.  While he is under anesthesia we will perform a repeat aspirate and depending on its findings may consider a repeat arthroscopic lavage of the left shoulder.  There is some mention regarding PICC line placement/and patient refusal and given the fact that we may have to surgically address both shoulders it would limit IV access peripherally into both arms tomorrow.  It would be more ideal to have a central access point as opposed to upper extremity peripheral type of IV access to allow [place] to address both shoulders at the time of surgery tomorrow.  We have tentatively added him on for 5 PM tomorrow. Karyssa Amaral PA-C  07/16/2020, 11:35 AM Contact # [phone]

## 2020-07-16 NOTE — Evaluation (Signed)
Occupational Therapy Evaluation Patient Details Name: [name] MRN: [id number] DOB: 1946-01-05 Today's Date: 07/16/2020    History of Present Illness 74 year old male presents 07/10/20 from home with generalized weakness, fever, found down by friend. Patient has chronic left shoulder problems requiring fluid drained off, by orthopedics, most recently 1 to 2 weeks ago. S/P arthroscopy and washout of Left shoulder, scheduled I&D R shoulder 12/2. 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   Clinical Impression   Patient lives alone in single level home, reports mod I at baseline with use of w/c at home for mobility. Currently patient requiring assistance for all ADLs and mobility due to significantly limited shoulder ROM and B UE strength, decreased balance, activity tolerance and safety awareness. Patient require mod A x2 to pivot to recliner, total A for LB ADL and max A for UB bathing/dressing. Patient able to feed himself with set up A with some compensatory movement of trunk to complete hand to mouth. Recommend continued acute OT services in order to maximize patient safety and independence with self care in order to facilitate D/C to venue listed below.    Follow Up Recommendations  SNF    Equipment Recommendations  3 in 1 bedside commode       Precautions / Restrictions Precautions Precautions: Fall Precaution Comments: painful B shoulders, rotator cuff tear/septic osteomylitis R, Left shoulder with dressing.      Mobility Bed Mobility Overal bed mobility: Needs Assistance Bed Mobility: Supine to Sit     Supine to sit: Mod assist;HOB elevated;+2 for physical assistance;+2 for safety/equipment     General bed mobility  comments: mod A x2 for trunk support due to limited UE use    Transfers Overall transfer level: Needs assistance Equipment used: None Transfers: Sit to/from [facility] Sit to Stand: Mod assist;+2 physical assistance;+2 safety/equipment Stand pivot transfers: Mod assist;+2 physical assistance;+2 safety/equipment       General transfer comment: please see toilet transfer in ADL section    Balance Overall balance assessment: Needs assistance Sitting-balance support: Feet supported Sitting balance-Leahy Scale: Fair     Standing balance support: Bilateral upper extremity supported Standing balance-Leahy Scale: Poor Standing balance comment: reliant on external support of therapists                           ADL either performed or assessed with clinical judgement   ADL Overall ADL's : Needs assistance/impaired Eating/Feeding: Set up;Sitting;Bed level Eating/Feeding Details (indicate cue type and reason): patient bringing trunk forward to eat due to significantly limited shoulder ROM Grooming: Sitting;Minimal assistance   Upper Body Bathing: Moderate assistance;Sitting   Lower Body Bathing: Sit to/from stand;Sitting/lateral leans;Total assistance   Upper Body Dressing : Maximal assistance;Sitting   Lower Body Dressing: Total assistance;Sitting/lateral leans;Sit to/from stand   Toilet Transfer: Moderate assistance;+2 for safety/equipment;+2 for physical assistance;Stand-pivot;Cueing for safety;Cueing for sequencing;BSC Toilet Transfer Details (indicate cue type and reason): to recliner, decreased standing balance and cues to sequence stepping LEs to recliner, poor eccentric lowering into chair, fall risk Toileting- Clothing Manipulation and Hygiene: Total assistance;Sitting/lateral lean;Sit to/from stand       Functional mobility during ADLs: Moderate assistance;+2 for physical assistance;+2 for safety/equipment;Cueing for safety;Cueing for  sequencing General ADL Comments: patient requiring  increased assistance for self care due to decreased strength, UE ROM, balance, safety awareness, activity tolerance                   Pertinent Vitals/Pain Pain Assessment: Faces Faces Pain Scale: Hurts even more Pain Location: right shoulder Pain Descriptors / Indicators: Grimacing;Guarding Pain Intervention(s): Monitored during session     Hand Dominance Right   Extremity/Trunk Assessment Upper Extremity Assessment Upper Extremity Assessment: RUE deficits/detail;LUE deficits/detail RUE Deficits / Details: grip 3/5, grossly intact elbow flexion/extension very trace movement at shoulder due to pain RUE: Unable to fully assess due to pain RUE Coordination: decreased gross motor;decreased fine motor LUE Deficits / Details: grip 3/5, grossly intact elbow flexion/extension, shoulder significantly limited ROM ~10 degrees flexion LUE Coordination: decreased gross motor;decreased fine motor   Lower Extremity Assessment Lower Extremity Assessment: Defer to PT evaluation       Communication Communication Communication: No difficulties   Cognition Arousal/Alertness: Awake/alert Behavior During Therapy: Impulsive Overall Cognitive Status: No family/caregiver present to determine baseline cognitive functioning Area of Impairment: Memory;Safety/judgement;Orientation                 Orientation Level: Time   Memory: Decreased short-term memory   Safety/Judgement: Decreased awareness of safety     General Comments: does not recall how long has been in hospital, does attempt to get OOB/stand before therapy ready, difficulty recalling timeline of events              Home Living Family/patient expects to be discharged to:: Private residence Living Arrangements: Alone Available Help at Discharge: Available PRN/intermittently Type of Home: House Home Access: Stairs to enter [employer] of Steps: 2 at front  and garage Entrance Stairs-Rails: [place]: One level     Bathroom Shower/Tub: Teacher, early years/pre: [place]: Environmental consultant - 2 wheels;Wheelchair - manual;Cane - single point          Prior Functioning/Environment Level of Independence: Independent with assistive device(s)        Comments: Mobility at times with wheelchair in the home.        OT Problem List: Decreased strength;Decreased range of motion;Decreased activity tolerance;Impaired balance (sitting and/or standing);Decreased cognition;Decreased safety awareness;Decreased knowledge of precautions;Decreased knowledge of use of DME or AE;Pain;Impaired UE functional use      OT Treatment/Interventions: Self-care/ADL training;Therapeutic exercise;DME and/or AE instruction;Therapeutic activities;Patient/family education;Balance training    OT Goals(Current goals can be found in the care plan section) Acute Rehab OT Goals Patient Stated Goal: get out of bed  OT Goal Formulation: With patient Time For Goal Achievement: 07/30/20 Potential to Achieve Goals: Good  OT Frequency: Min 2X/week           Co-evaluation PT/OT/SLP Co-Evaluation/Treatment: Yes Reason for Co-Treatment: To address functional/ADL transfers;For patient/therapist safety   OT goals addressed during session: ADL's and self-care      AM-PAC OT "6 Clicks" Daily Activity     Outcome Measure Help from another person eating meals?: A Little Help from another person taking care of personal grooming?: A Little Help from another person toileting, which includes using toliet, bedpan, or urinal?: Total Help from another person bathing (including washing, rinsing, drying)?: A Lot Help from another person to put on and taking off regular upper body clothing?: A Lot Help from another person to put on and taking off regular lower body clothing?: Total 6 Click Score: 12   End of Session Equipment Utilized During  Treatment:  Gait belt Nurse Communication: Mobility status  Activity Tolerance: Patient tolerated treatment well Patient left: in chair;with call bell/phone within reach;with chair alarm set  OT Visit Diagnosis: Unsteadiness on feet (R26.81);Other abnormalities of gait and mobility (R26.89);Muscle weakness (generalized) (M62.81);History of falling (Z91.81);Pain Pain - Right/Left: Right Pain - part of body: Shoulder                Time: 4232-0094 OT Time Calculation (min): 28 min Charges:  OT General Charges $OT Visit: 1 Visit OT Evaluation $OT Eval Moderate Complexity: [place] OT OT pager: [place] 07/16/2020, 1:16 PM

## 2020-07-16 NOTE — Progress Notes (Signed)
OT Cancellation Note  Patient Details Name: [name] MRN: [id number] DOB: 01-11-1946   Cancelled Treatment:    Reason Eval/Treat Not Completed: Other (comment) Patient needing I & D of R shoulder and with elevated troponin this AM. Will check back as appropriate.   Delbert Phenix OT OT pager: [phone]   Rosemary Holms 07/16/2020, 10:58 AM

## 2020-07-16 NOTE — Anesthesia Preprocedure Evaluation (Addendum)
Anesthesia Evaluation  Patient identified by MRN, date of birth, ID band Patient awake    Reviewed: Allergy & Precautions, NPO status , Patient's Chart, lab work & pertinent test results  Airway Mallampati: II  TM Distance: >3 FB Neck ROM: Full    Dental no notable dental hx.    Pulmonary shortness of breath, COPD, former smoker,    Pulmonary exam normal breath sounds clear to auscultation       Cardiovascular hypertension, + CAD, + Past MI, + CABG and + Peripheral Vascular Disease  (-) CHF Normal cardiovascular exam Rhythm:Regular Rate:Normal  07/10/20 Echo 1. Technically difficult study with limited views  2. Left ventricular ejection fraction, by estimation, is 50 to 55%. The  left ventricle has low normal function. Left ventricular diastolic  parameters were normal   [facility] delirium    GI/Hepatic negative GI ROS, Neg liver ROS, GERD  ,  Endo/Other  negative endocrine ROSdiabetes  Renal/GU Renal diseasenegative Renal ROS  negative genitourinary   Musculoskeletal negative musculoskeletal ROS (+)   Abdominal   Peds negative pediatric ROS (+)  Hematology negative hematology ROS (+)   Anesthesia Other Findings All see list  Reproductive/Obstetrics negative OB ROS                           Anesthesia Physical Anesthesia Plan  ASA: III  Anesthesia Plan: General   Post-op Pain Management:    Induction: Intravenous  PONV Risk Score and Plan: 2 and Treatment may vary due to age or medical condition, Ondansetron and Dexamethasone  Airway Management Planned: Oral ETT  Additional Equipment: None  Intra-op Plan:   Post-operative Plan: Extubation in OR  Informed Consent:     Dental advisory given  Plan Discussed with: CRNA and Surgeon  Anesthesia Plan Comments:       Anesthesia Quick Evaluation

## 2020-07-17 ENCOUNTER — Encounter (HOSPITAL_COMMUNITY): Payer: Self-pay | Admitting: Pulmonary Disease

## 2020-07-17 ENCOUNTER — Inpatient Hospital Stay (HOSPITAL_COMMUNITY): Payer: Medicare Other | Admitting: Anesthesiology

## 2020-07-17 ENCOUNTER — Encounter (HOSPITAL_COMMUNITY)
Admission: EM | Disposition: A | Discharge status: Home Health | Payer: Self-pay | Source: Home / Self Care | Attending: Internal Medicine

## 2020-07-17 HISTORY — PX: SHOULDER ARTHROSCOPY: SHX128

## 2020-07-17 LAB — COMPREHENSIVE METABOLIC PANEL
ALT: 25 U/L (ref 0–44)
AST: 18 U/L (ref 15–41)
Albumin: 2.3 g/dL — ABNORMAL LOW (ref 3.5–5.0)
Alkaline Phosphatase: 103 U/L (ref 38–126)
Anion gap: 6 (ref 5–15)
BUN: 14 mg/dL (ref 8–23)
CO2: 29 mmol/L (ref 22–32)
Calcium: 8.2 mg/dL — ABNORMAL LOW (ref 8.9–10.3)
Chloride: 95 mmol/L — ABNORMAL LOW (ref 98–111)
Creatinine, Ser: 0.84 mg/dL (ref 0.61–1.24)
GFR, Estimated: >60 mL/min (ref >60)
Glucose, Bld: 152 mg/dL — ABNORMAL HIGH (ref 70–99)
Potassium: 4.1 mmol/L (ref 3.5–5.1)
Sodium: 130 mmol/L — ABNORMAL LOW (ref 135–145)
Total Bilirubin: 0.5 mg/dL (ref 0.3–1.2)
Total Protein: 6.3 g/dL — ABNORMAL LOW (ref 6.5–8.1)

## 2020-07-17 LAB — CBC
HCT: 27.1 % — ABNORMAL LOW (ref 39.0–52.0)
Hemoglobin: 8.6 g/dL — ABNORMAL LOW (ref 13.0–17.0)
MCH: 29.4 pg (ref 26.0–34.0)
MCHC: 31.7 g/dL (ref 30.0–36.0)
MCV: 92.5 fL (ref 80.0–100.0)
Platelets: 376 10*3/uL (ref 150–400)
RBC: 2.93 MIL/uL — ABNORMAL LOW (ref 4.22–5.81)
RDW: 17 % — ABNORMAL HIGH (ref 11.5–15.5)
WBC: 11.3 10*3/uL — ABNORMAL HIGH (ref 4.0–10.5)
nRBC: 0 % (ref 0.0–0.2)

## 2020-07-17 LAB — BODY FLUID CULTURE

## 2020-07-17 LAB — GLUCOSE, CAPILLARY
Glucose-Capillary: 136 mg/dL — ABNORMAL HIGH (ref 70–99)
Glucose-Capillary: 92 mg/dL (ref 70–99)
Glucose-Capillary: 202 mg/dL — ABNORMAL HIGH (ref 70–99)
Glucose-Capillary: 143 mg/dL — ABNORMAL HIGH (ref 70–99)
Glucose-Capillary: 154 mg/dL — ABNORMAL HIGH (ref 70–99)

## 2020-07-17 LAB — TYPE AND SCREEN
ABO/RH(D): A NEG
Antibody Screen: NEGATIVE

## 2020-07-17 SURGERY — ARTHROSCOPY, SHOULDER
Anesthesia: General | Site: Shoulder | Laterality: Bilateral

## 2020-07-17 MED ORDER — OXYCODONE HCL 5 MG/5ML PO SOLN: 5.0000 mg | Freq: Once | ORAL | Status: DC | PRN

## 2020-07-17 MED ORDER — ONDANSETRON HCL 4 MG/2ML IJ SOLN
INTRAMUSCULAR | Status: AC
  Administered 2020-07-17: 4 mg via INTRAVENOUS
  Filled 2020-07-17: qty 2

## 2020-07-17 MED ORDER — ONDANSETRON HCL 4 MG/2ML IJ SOLN
INTRAMUSCULAR | Status: DC | PRN
  Administered 2020-07-17: 4 mg via INTRAVENOUS

## 2020-07-17 MED ORDER — LIDOCAINE 2% (20 MG/ML) 5 ML SYRINGE
INTRAMUSCULAR | Status: DC | PRN
  Administered 2020-07-17: 60 mg via INTRAVENOUS

## 2020-07-17 MED ORDER — SUCCINYLCHOLINE CHLORIDE 20 MG/ML IJ SOLN
INTRAMUSCULAR | Status: DC | PRN
  Administered 2020-07-17: 100 mg via INTRAVENOUS

## 2020-07-17 MED ORDER — SUGAMMADEX SODIUM 200 MG/2ML IV SOLN
INTRAVENOUS | Status: DC | PRN
  Administered 2020-07-17: 300 mg via INTRAVENOUS

## 2020-07-17 MED ORDER — LACTATED RINGERS IV SOLN: INTRAVENOUS | Status: DC

## 2020-07-17 MED ORDER — CEFAZOLIN SODIUM-DEXTROSE 2-4 GM/100ML-% IV SOLN
2.0000 g | INTRAVENOUS | Status: AC
  Administered 2020-07-17: 2 g via INTRAVENOUS
  Filled 2020-07-17: qty 100

## 2020-07-17 MED ORDER — DEXAMETHASONE SODIUM PHOSPHATE 10 MG/ML IJ SOLN
INTRAMUSCULAR | Status: DC | PRN
  Administered 2020-07-17: 5 mg via INTRAVENOUS

## 2020-07-17 MED ORDER — PHENYLEPHRINE 40 MCG/ML (10ML) SYRINGE FOR IV PUSH (FOR BLOOD PRESSURE SUPPORT)
PREFILLED_SYRINGE | INTRAVENOUS | Status: DC | PRN
  Administered 2020-07-17 (×2): 80 ug via INTRAVENOUS
  Administered 2020-07-17: 120 ug via INTRAVENOUS

## 2020-07-17 MED ORDER — FENTANYL CITRATE (PF) 100 MCG/2ML IJ SOLN
INTRAMUSCULAR | Status: AC
  Administered 2020-07-17: 25 ug via INTRAVENOUS
  Filled 2020-07-17: qty 2

## 2020-07-17 MED ORDER — PANTOPRAZOLE SODIUM 40 MG IV SOLR
40.0000 mg | INTRAVENOUS | Status: DC
Start: 2020-07-17 — End: 2020-07-17

## 2020-07-17 MED ORDER — ONDANSETRON HCL 4 MG/2ML IJ SOLN: 4.0000 mg | Freq: Once | INTRAMUSCULAR | Status: DC | PRN

## 2020-07-17 MED ORDER — ACETAMINOPHEN 10 MG/ML IV SOLN: 1000.0000 mg | Freq: Once | INTRAVENOUS | Status: DC | PRN

## 2020-07-17 MED ORDER — FENTANYL CITRATE (PF) 100 MCG/2ML IJ SOLN
INTRAMUSCULAR | Status: DC | PRN
  Administered 2020-07-17: 50 ug via INTRAVENOUS

## 2020-07-17 MED ORDER — FENTANYL CITRATE (PF) 100 MCG/2ML IJ SOLN
25.0000 ug | INTRAMUSCULAR | Status: DC | PRN
  Administered 2020-07-17: 25 ug via INTRAVENOUS

## 2020-07-17 MED ORDER — 0.9 % SODIUM CHLORIDE (POUR BTL) OPTIME
TOPICAL | Status: DC | PRN
  Administered 2020-07-17: 1000 mL

## 2020-07-17 MED ORDER — CHLORHEXIDINE GLUCONATE 4 % EX LIQD
60.0000 mL | Freq: Once | CUTANEOUS | Status: AC
  Administered 2020-07-17: 4 via TOPICAL
  Filled 2020-07-17: qty 60

## 2020-07-17 MED ORDER — FENTANYL CITRATE (PF) 250 MCG/5ML IJ SOLN
INTRAMUSCULAR | Status: AC
  Filled 2020-07-17: qty 5

## 2020-07-17 MED ORDER — FENTANYL CITRATE (PF) 100 MCG/2ML IJ SOLN: 25.0000 ug | INTRAMUSCULAR | Status: DC | PRN

## 2020-07-17 MED ORDER — FENTANYL CITRATE (PF) 100 MCG/2ML IJ SOLN
25.0000 ug | INTRAMUSCULAR | Status: DC | PRN
  Administered 2020-07-17: 50 ug via INTRAVENOUS

## 2020-07-17 MED ORDER — SODIUM CHLORIDE 0.9 % IR SOLN
Status: DC | PRN
  Administered 2020-07-17: 6000 mL

## 2020-07-17 MED ORDER — POVIDONE-IODINE 10 % EX SWAB
2.0000 "application " | Freq: Once | CUTANEOUS | Status: AC
  Administered 2020-07-17: 2 via TOPICAL

## 2020-07-17 MED ORDER — PANTOPRAZOLE SODIUM 40 MG PO TBEC
40.0000 mg | DELAYED_RELEASE_TABLET | Freq: Once | ORAL | Status: AC
  Administered 2020-07-17: 40 mg via ORAL
  Filled 2020-07-17: qty 1

## 2020-07-17 MED ORDER — OXYCODONE HCL 5 MG PO TABS: 5.0000 mg | ORAL_TABLET | Freq: Once | ORAL | Status: DC | PRN

## 2020-07-17 MED ORDER — NITROGLYCERIN 0.4 MG SL SUBL: 0.4000 mg | SUBLINGUAL_TABLET | SUBLINGUAL | Status: DC | PRN

## 2020-07-17 MED ORDER — ROCURONIUM BROMIDE 10 MG/ML (PF) SYRINGE
PREFILLED_SYRINGE | INTRAVENOUS | Status: DC | PRN
  Administered 2020-07-17: 10 mg via INTRAVENOUS
  Administered 2020-07-17: 40 mg via INTRAVENOUS

## 2020-07-17 MED ORDER — NITROGLYCERIN 0.4 MG SL SUBL
SUBLINGUAL_TABLET | SUBLINGUAL | Status: AC
  Administered 2020-07-17: 0.4 mg via SUBLINGUAL
  Filled 2020-07-17: qty 3

## 2020-07-17 MED ORDER — LORAZEPAM 2 MG/ML IJ SOLN
1.0000 mg | Freq: Once | INTRAMUSCULAR | Status: AC
  Administered 2020-07-17: 1 mg via INTRAVENOUS
  Filled 2020-07-17: qty 1

## 2020-07-17 MED ORDER — ONDANSETRON HCL 4 MG/2ML IJ SOLN: 4.0000 mg | Freq: Once | INTRAMUSCULAR | Status: DC

## 2020-07-17 MED ORDER — PROPOFOL 10 MG/ML IV BOLUS
INTRAVENOUS | Status: DC | PRN
  Administered 2020-07-17: 70 mg via INTRAVENOUS

## 2020-07-17 MED ORDER — PROPOFOL 10 MG/ML IV BOLUS
INTRAVENOUS | Status: AC
  Filled 2020-07-17: qty 20

## 2020-07-17 MED ORDER — PHENYLEPHRINE HCL-NACL 10-0.9 MG/250ML-% IV SOLN
INTRAVENOUS | Status: DC | PRN
  Administered 2020-07-17: 30 ug/min via INTRAVENOUS

## 2020-07-17 MED ORDER — ONDANSETRON HCL 4 MG/2ML IJ SOLN: 4.0000 mg | Freq: Once | INTRAMUSCULAR | Status: AC | PRN

## 2020-07-17 SURGICAL SUPPLY — 66 items
APL PRP STRL LF DISP 70% ISPRP (MISCELLANEOUS) ×1
BLADE EXCALIBUR 4.0MM X 13CM (MISCELLANEOUS) ×1
BLADE EXCALIBUR 4.0X13 (MISCELLANEOUS) ×2 IMPLANT
BOOTIES KNEE HIGH SLOAN (MISCELLANEOUS) ×6 IMPLANT
BURR OVAL 8 FLU 4.0MM X 13CM (MISCELLANEOUS) ×1
BURR OVAL 8 FLU 4.0X13 (MISCELLANEOUS) ×2 IMPLANT
BURR OVAL 8 FLU 5.0MM X 13CM (MISCELLANEOUS) ×1
BURR OVAL 8 FLU 5.0X13 (MISCELLANEOUS) ×2 IMPLANT
CANNULA ACUFLEX KIT 5X76 (CANNULA) ×3 IMPLANT
CANNULA DRILOCK 5.0MMX75MM (CANNULA)
CANNULA DRILOCK 5.0X75 (CANNULA) IMPLANT
CANNULA TWIST IN 8.25X7CM (CANNULA) IMPLANT
CHLORAPREP W/TINT 26 (MISCELLANEOUS) ×3 IMPLANT
CLOSURE WOUND 1/2 X4 (GAUZE/BANDAGES/DRESSINGS) ×1
CONNECTOR 5 IN 1 STRAIGHT STRL (MISCELLANEOUS) ×3 IMPLANT
COOLER ICEMAN CLASSIC (MISCELLANEOUS) IMPLANT
COVER WAND RF STERILE (DRAPES) ×3 IMPLANT
DISSECTOR  3.8MM X 13CM (MISCELLANEOUS) ×2
DISSECTOR 3.8MM X 13CM (MISCELLANEOUS) ×1 IMPLANT
DRAPE INCISE 23X17 IOBAN STRL (DRAPES) ×2
DRAPE INCISE IOBAN 23X17 STRL (DRAPES) ×1 IMPLANT
DRAPE INCISE IOBAN 66X45 STRL (DRAPES) ×3 IMPLANT
DRAPE ORTHO SPLIT 77X108 STRL (DRAPES)
DRAPE STERI 35X30 U-POUCH (DRAPES) ×3 IMPLANT
DRAPE SURG 17X11 SM STRL (DRAPES) ×3 IMPLANT
DRAPE SURG ORHT 6 SPLT 77X108 (DRAPES) IMPLANT
DRAPE U-SHAPE 47X51 STRL (DRAPES) ×3 IMPLANT
DRSG PAD ABDOMINAL 8X10 ST (GAUZE/BANDAGES/DRESSINGS) ×6 IMPLANT
DURAPREP 26ML APPLICATOR (WOUND CARE) ×6 IMPLANT
EVACUATOR 1/8 PVC DRAIN (DRAIN) IMPLANT
EVACUATOR DRAINAGE 7X20 100CC (MISCELLANEOUS) ×1 IMPLANT
EVACUATOR SILICONE 100CC (MISCELLANEOUS) ×3
GAUZE 4X4 16PLY RFD (DISPOSABLE) ×3 IMPLANT
GAUZE SPONGE 4X4 12PLY STRL (GAUZE/BANDAGES/DRESSINGS) ×12 IMPLANT
GLOVE BIO SURGEON STRL SZ7.5 (GLOVE) ×3 IMPLANT
GLOVE BIO SURGEON STRL SZ8 (GLOVE) ×3 IMPLANT
GLOVE SS BIOGEL STRL SZ 7 (GLOVE) ×2 IMPLANT
GLOVE SS BIOGEL STRL SZ 7.5 (GLOVE) ×1 IMPLANT
GLOVE SUPERSENSE BIOGEL SZ 7 (GLOVE) ×4
GLOVE SUPERSENSE BIOGEL SZ 7.5 (GLOVE) ×2
GOWN STRL REUS W/TWL LRG LVL3 (GOWN DISPOSABLE) ×6 IMPLANT
KIT BASIN OR (CUSTOM PROCEDURE TRAY) ×3 IMPLANT
KIT SHOULDER TRACTION (DRAPES) ×3 IMPLANT
KIT TURNOVER KIT A (KITS) IMPLANT
MANIFOLD NEPTUNE II (INSTRUMENTS) ×3 IMPLANT
NEEDLE SCORPION MULTI FIRE (NEEDLE) IMPLANT
NS IRRIG 1000ML POUR BTL (IV SOLUTION) ×3 IMPLANT
PACK ARTHROSCOPY WL (CUSTOM PROCEDURE TRAY) ×3 IMPLANT
PAD ARMBOARD 7.5X6 YLW CONV (MISCELLANEOUS) ×3 IMPLANT
PAD COLD SHLDR WRAP-ON (PAD) IMPLANT
PROBE APOLLO 90XL (SURGICAL WAND) ×3 IMPLANT
SLING ARM FOAM STRAP MED (SOFTGOODS) IMPLANT
SPONGE LAP 4X18 RFD (DISPOSABLE) ×3 IMPLANT
STRIP CLOSURE SKIN 1/2X4 (GAUZE/BANDAGES/DRESSINGS) ×2 IMPLANT
SUT ETHILON 2 0 PS N (SUTURE) ×3 IMPLANT
SUT FIBERWIRE #2 38 T-5 BLUE (SUTURE)
SUT MNCRL AB 3-0 PS2 18 (SUTURE) ×3 IMPLANT
SUT PDS AB 0 CT 36 (SUTURE) IMPLANT
SUT TIGER TAPE 7 IN WHITE (SUTURE) ×3 IMPLANT
SUTURE FIBERWR #2 38 T-5 BLUE (SUTURE) IMPLANT
SYR 20ML LL LF (SYRINGE) ×6 IMPLANT
TAPE FIBER 2MM 7IN #2 BLUE (SUTURE) IMPLANT
TAPE PAPER 3X10 WHT MICROPORE (GAUZE/BANDAGES/DRESSINGS) ×3 IMPLANT
TOWEL OR 17X26 10 PK STRL BLUE (TOWEL DISPOSABLE) ×3 IMPLANT
TUBING ARTHROSCOPY IRRIG 16FT (MISCELLANEOUS) ×3 IMPLANT
WATER STERILE IRR 1000ML POUR (IV SOLUTION) ×3 IMPLANT

## 2020-07-17 NOTE — Addendum Note (Signed)
Addendum  created 07/17/20 2035 by Claudia Desanctis, CRNA   LDA properties accepted

## 2020-07-17 NOTE — Progress Notes (Signed)
Progress Note        PROGRESS NOTE    [name]  SEG:[id number] DOB: 1945/11/12 DOA: 07/09/2020 PCP: Doreatha Lew, MD   Brief Narrative:  [name] is a 74 y.o. male History of DM, PAD, CAD s/p CABG. Presented tot he ED with weakness and fever. The patient was found to be in septic shock  (group B streptococcal bacteremia) due to a septic left shoulder.  He is s/p arthroscopic I & D and washout on 07/10/20.  Team is concern for a septic right shoulder due to limited ROM and tenderness. Left septic shoulder aspirate grew group D strep on right shoulder culture grew Pseudomonas.  Patient currently on cefepime.  ID on board. Patient was supposed to get tunneled central catheter for long-term antibiotics therapy but refused to have the procedure done today.  Discussed with IR and plan for Friday.   He is planned for surgical exploration of right shoulder and left shoulder aspiration by orthopedics. 07/17/2020-patient has significant agitation and attempting to get out of bed.  Obviously confused and unable to follow command.  Concerns for hospital-acquired delirium versus sundowning. I have requested for a room with open window to maximize sunlight/environmental modification.  Since patient is hemodynamically stable.  I do not think he needs continued ICU care and may moved to MedSurg floor with telemetry and to room with adequate sunlight.   Assessment & Plan:   Active Problems:   Peripheral artery disease (HCC)   Hx of CABG   Septic shock (HCC)   Shortness of breath   Status post aortobifemoral bypass surgery   History of carotid endarterectomy   Type 2 diabetes mellitus with hyperlipidemia (HCC)   Diabetes mellitus type II, non insulin dependent (HCC)   Acute respiratory failure (HCC)   Pyogenic arthritis of left shoulder region [facility])   Acute pain of right shoulder   Sepsis due to group B Streptococcus ([name])  Clinical problems list 1.  Sepsis with septic shock  secondary to septic left shoulder with GBS bacteremia, present on admission. Septic right shoulder with Pseudomonas bacteremia He is status post arthroscopic drainage/washout by Ortho Patient was initially on high-dose penicillin. Due to growth of Pseudomonas on right shoulder  aspirate.  Antibiotics has been tailored to: For GBS and Pseudomonas with  cefepime by ID. Ortho plans for surgical exploration of both shoulders tomorrow in the OR today 07/17/2020 Septic shock has resolved PT on board with ambulation out of bed to chair. Maintain fall precaution  2.  Benign prostatic hyperplasia with urinary symptoms Patient has indwelling Foley catheter Continue with Flomax   3.  NSTEMI with detectable troponin likely due to demand ischemia from ongoing sepsis with septic shock.  Troponin peaked and trending down.  Patient has no chest pain.  EKG was negative for any acute ischemic event. Cardiology evaluated and recommended continue with aspirin and Brilinta.  3.  Chronic obstructive pulmonary disease.  Not in acute exacerbation Continue with bronchodilators as needed and oxygen supplementation if necessary.  4.  Anemia of chronic disease requiring blood transfusion. Patient status post 1 unit of packed red blood cell transfusion on 07/12/2020.  No active bleeding identified. Continue to monitor CBC and transfuse if hemoglobin is less than 7.0.  5.  Diabetes mellitus type 2.  Last hemoglobin A1c on 05/12/2020 was 7.2 Continue with sliding scale insulin Fingersticks before meals and at bedtime Maintain hypoglycemic protocol and diabetic diet  6.  Coronary artery disease Peripheral artery disease  status post aortobifemoral bypass by Dr. Trula Slade in 2012. 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  Hyperlipidemia Continue with PCSK9 inhibitor  8.  Hospital-acquired delirium/sundowning.  Patient appears agitated  and wanting to get out of bed to go home.  Not following command. Likely sundowning. Patient will need environmental modification. I have requested for hospital room with adequate sunlight, with minimal noise.  Nursing staff to ensure nighttime protocol with windows closed and TV turned off during sleep time. Bundle labs to minimize sleep interruption. Initiate melatonin 6 mg q. nightly   DVT prophylaxis:  Lovenox subacute Code Status: Full  Family Communication: None at bedside  Disposition Plan: Patient has significant comorbidities with bilateral septic shoulder that would require drainages by Ortho and prolonged antibiotic therapy. ID has projected antibiotic therapy to 08/21/2020. Patient is not clinically stable for discharge at this time.   Consultants:  Infectious disease Orthopedics   Procedures:  Left shoulder arthroscopic incision and drainage 2D echocardiogram with EF of 50 to 55% Planned for right shoulder surgical exploration on 07/17/2020   Antimicrobials: Vancomycin 11/24 through 11/26 Rocephin 11/24, then 11/26 Cefepime 11/25-11/26 Penicillin G 11/27  Cefepime 12/1>>    Subjective: Patient was seen and evaluated at bedside.  He is alert but obviously confused and unable to follow command.  He is attempting to get out of bed and stating that he wants to go home. Nursing staff on the bedside and try to redirect him. Patient appears to be delirious from sundowning and will require change of room that is well lit.  Objective: Vitals:   07/17/20 0100 07/17/20 0200 07/17/20 0400 07/17/20 0800  BP: (!) 121/54 (!) 138/49 (!) 130/47 (!) 138/107  Pulse: 92 86  77  Resp: 18 16 14 20   Temp:   98 F (36.7 C) 98.6 F (37 C)  TempSrc:   Axillary Oral  SpO2: 98% 100% 100% 100%  Weight:      Height:        Intake/Output Summary (Last 24 hours) at 07/17/2020 1250 Last data filed at 07/17/2020 0540 Gross per 24 hour  Intake 200 ml  Output 2450 ml  Net -2250 ml    Filed Weights   07/09/20 2017 07/10/20 0130 07/12/20 0515  Weight: 86.2 kg 92.5 kg 91 kg    Examination:  General exam: Patient is confused, agitated and attempting to get out of bed.  He is not in acute distress.   Respiratory system: Clear to auscultation. Respiratory effort normal. Cardiovascular system: S1 & S2 heard, positive murmur, no rubs, gallops or clicks. No pedal edema. Gastrointestinal system: Abdomen is nondistended, soft and nontender. No organomegaly or masses felt. Normal bowel sounds heard. Central nervous system: Alert and oriented. No focal neurological deficits. Extremities: Symmetric 5 x 5 power bilateral lower extremity.  Diminished motion bilateral upper extremity with mild swelling of left and right shoulders.  Patient denied any tenderness on palpation. Skin: No rashes, lesions or ulcers Psychiatry: Judgement and insight appear normal. Mood & affect appropriate.     Data Reviewed: I have personally reviewed following labs and imaging studies  CBC: Recent Labs  Lab 07/11/20 0425 07/11/20 0425 07/12/20 0422 07/12/20 1704 07/13/20 0603 07/14/20 0420 07/17/20 0314  WBC 10.4  --  9.2  --  11.5* 9.4 11.3*  NEUTROABS 8.1*  --   --   --   --   --   --   HGB  9.1*   < > 7.9* 9.2* 10.2* 9.8* 8.6*  HCT 27.7*   < > 23.2* 27.9* 30.8* 29.1* 27.1*  MCV 88.5  --  86.2  --  87.5 86.6 92.5  PLT 355  --  320  --  413* 403* 376   < > = values in this interval not displayed.   Basic Metabolic Panel: Recent Labs  Lab 07/11/20 0425 07/11/20 0425 07/12/20 0422 07/12/20 1704 07/13/20 0603 07/14/20 0420 07/16/20 1955 07/17/20 0314  NA 129*   < > 131* 130* 131* 134*  --  130*  K 4.3   < > 3.7 3.8 3.6 3.6  --  4.1  CL 100   < > 98 97* 97* 96*  --  95*  CO2 19*   < > 24 22 23 28   --  29  GLUCOSE 143*   < > 131* 181* 175* 165*  --  152*  BUN 13   < > 17 22 23 22   --  14  CREATININE 0.60*   < > 0.58* 0.78 0.73 0.66  --  0.84  CALCIUM 8.1*   < > 7.9* 8.1* 8.3*  8.6*  --  8.2*  MG 1.8  --  1.9  --   --   --  2.0  --   PHOS  --   --   --   --   --   --  3.2  --    < > = values in this interval not displayed.   GFR: Estimated Creatinine Clearance: 84.7 mL/min (by C-G formula based on SCr of 0.84 mg/dL). Liver Function Tests: Recent Labs  Lab 07/11/20 0425 07/14/20 0420 07/17/20 0314  AST 20 67* 18  ALT 13 34 25  ALKPHOS 113  --  103  BILITOT 1.0  --  0.5  PROT 6.4*  --  6.3*  ALBUMIN 2.6*  --  2.3*   No results for input(s): LIPASE, AMYLASE in the last 168 hours. No results for input(s): AMMONIA in the last 168 hours. Coagulation Profile: No results for input(s): INR, PROTIME in the last 168 hours. Cardiac Enzymes: No results for input(s): CKTOTAL, CKMB, CKMBINDEX, TROPONINI in the last 168 hours. BNP (last 3 results) No results for input(s): PROBNP in the last 8760 hours. HbA1C: No results for input(s): HGBA1C in the last 72 hours. CBG: Recent Labs  Lab 07/16/20 1701 07/16/20 2331 07/17/20 0414 07/17/20 0729 07/17/20 1154  GLUCAP 206* 234* 136* 92 202*   Lipid Profile: No results for input(s): CHOL, HDL, LDLCALC, TRIG, CHOLHDL, LDLDIRECT in the last 72 hours. Thyroid Function Tests: No results for input(s): TSH, T4TOTAL, FREET4, T3FREE, THYROIDAB in the last 72 hours. Anemia Panel: No results for input(s): VITAMINB12, FOLATE, FERRITIN, TIBC, IRON, RETICCTPCT in the last 72 hours. Sepsis Labs: No results for input(s): PROCALCITON, LATICACIDVEN in the last 168 hours.  Recent Results (from the past 240 hour(s))  Blood Culture (routine x 2)     Status: None   Collection Time: 07/09/20  9:05 PM   Specimen: BLOOD LEFT FOREARM  Result Value Ref Range Status   Specimen Description   Final    BLOOD LEFT FOREARM Performed at [facility] [address] [place], [place] [address]    Special Requests   Final    BOTTLES DRAWN AEROBIC AND ANAEROBIC Blood Culture results may not be optimal due to an excessive  volume of blood received in culture bottles Performed at [facility], [address] [place], [place] [address]    Culture   Final    NO GROWTH 5 DAYS Performed at [facility], [place] [address], [place], [place] [address]    Report Status 07/15/2020 FINAL  Final  Blood Culture (routine x 2)     Status: Abnormal   Collection Time: 07/09/20  9:05 PM   Specimen: BLOOD RIGHT FOREARM  Result Value Ref Range Status   Specimen Description   Final    BLOOD RIGHT FOREARM Performed at [facility] [address], [place], [place] [address]    Special Requests   Final    BOTTLES DRAWN AEROBIC AND ANAEROBIC Blood Culture adequate volume Performed at [facility] [address] [place], [place] [address]    Culture  Setup Time   Final    GRAM POSITIVE COCCI IN CHAINS IN BOTH AEROBIC AND ANAEROBIC BOTTLES CRITICAL RESULT CALLED TO, READ BACK BY AND VERIFIED WITHDomenick Gong PHARMD [id number] 07/10/20 A BROWNING Performed at [facility], [place] [address], [place],  [address]    Culture GROUP B STREP(S.AGALACTIAE)ISOLATED (A)  Final   Report Status 07/12/2020 FINAL  Final   Organism ID, Bacteria GROUP B STREP(S.AGALACTIAE)ISOLATED  Final      Susceptibility   Group b strep(s.agalactiae)isolated - MIC*    CLINDAMYCIN >=1 RESISTANT Resistant     AMPICILLIN <=0.25 SENSITIVE Sensitive     ERYTHROMYCIN >=8 RESISTANT Resistant     VANCOMYCIN 0.5 SENSITIVE Sensitive     CEFTRIAXONE <=0.12 SENSITIVE Sensitive     LEVOFLOXACIN 0.5 SENSITIVE Sensitive     PENICILLIN Value in next row Sensitive      SENSITIVE0.06    * GROUP B STREP(S.AGALACTIAE)ISOLATED  Blood Culture ID Panel (Reflexed)     Status: Abnormal   Collection Time: 07/09/20  9:05 PM  Result Value Ref Range Status   Enterococcus faecalis NOT DETECTED NOT DETECTED Final   Enterococcus Faecium NOT DETECTED NOT DETECTED Final   Listeria monocytogenes NOT DETECTED NOT  DETECTED Final   Staphylococcus species NOT DETECTED NOT DETECTED Final   Staphylococcus aureus (BCID) NOT DETECTED NOT DETECTED Final   Staphylococcus epidermidis NOT DETECTED NOT DETECTED Final   Staphylococcus lugdunensis NOT DETECTED NOT DETECTED Final   Streptococcus species DETECTED (A) NOT DETECTED Final    Comment: CRITICAL RESULT CALLED TO, READ BACK BY AND VERIFIED WITH: S CHRISTY PHARMD 1631 07/10/20 A BROWNING    Streptococcus agalactiae DETECTED (A) NOT DETECTED Final    Comment: CRITICAL RESULT CALLED TO, READ BACK BY AND VERIFIED WITH: S CHRISTY PHARMD 1631 07/10/20 A BROWNING    Streptococcus pneumoniae NOT DETECTED NOT DETECTED Final   Streptococcus pyogenes NOT DETECTED NOT DETECTED Final   A.calcoaceticus-baumannii NOT DETECTED NOT DETECTED Final   Bacteroides fragilis NOT DETECTED NOT DETECTED Final   Enterobacterales NOT DETECTED NOT DETECTED Final   Enterobacter cloacae complex NOT DETECTED NOT DETECTED Final   Escherichia coli NOT DETECTED NOT DETECTED Final   Klebsiella aerogenes NOT DETECTED NOT DETECTED Final   Klebsiella oxytoca NOT DETECTED NOT DETECTED Final   Klebsiella pneumoniae NOT DETECTED NOT DETECTED Final   Proteus species NOT DETECTED NOT DETECTED Final   Salmonella species NOT DETECTED NOT DETECTED Final   Serratia marcescens NOT DETECTED NOT DETECTED Final   Haemophilus influenzae NOT DETECTED NOT DETECTED Final   Neisseria meningitidis NOT DETECTED NOT DETECTED Final   Pseudomonas aeruginosa NOT DETECTED NOT DETECTED Final   Stenotrophomonas maltophilia NOT DETECTED  NOT DETECTED Final   Candida albicans NOT DETECTED NOT DETECTED Final   Candida auris NOT DETECTED NOT DETECTED Final   Candida glabrata NOT DETECTED NOT DETECTED Final   Candida krusei NOT DETECTED NOT DETECTED Final   Candida parapsilosis NOT DETECTED NOT DETECTED Final   Candida tropicalis NOT DETECTED NOT DETECTED Final   Cryptococcus neoformans/gattii NOT DETECTED NOT  DETECTED Final    Comment: Performed at [facility], [place] [address] [place] [address], [place], [place] [address]  Resp Panel by RT-PCR (Flu A&B, Covid) Nasopharyngeal Swab     Status: None   Collection Time: 07/09/20  9:06 PM   Specimen: Nasopharyngeal Swab; Nasopharyngeal(NP) swabs in vial transport medium  Result Value Ref Range Status   SARS Coronavirus 2 by RT PCR NEGATIVE NEGATIVE Final    Comment: (NOTE) SARS-CoV-2 target nucleic acids are NOT DETECTED.  The SARS-CoV-2 RNA is generally detectable in upper respiratory specimens during the acute phase of infection. The lowest concentration of SARS-CoV-2 viral copies this assay can detect is 138 copies/mL. A negative result does not preclude SARS-Cov-2 infection and should not be used as the sole basis for treatment or other patient management decisions. A negative result may occur with  improper specimen collection/handling, submission of specimen other than nasopharyngeal swab, presence of viral mutation(s) within the areas targeted by this assay, and inadequate number of viral copies(<138 copies/mL). A negative result must be combined with clinical observations, patient history, and epidemiological information. The expected result is Negative.  Fact Sheet for Patients:  EntrepreneurPulse.com.au  Fact Sheet for Healthcare Providers:  IncredibleEmployment.be  This test is no t yet approved or cleared by the [place] FDA and  has been authorized for detection and/or diagnosis of SARS-CoV-2 by FDA under an Emergency Use Authorization (EUA). This EUA will remain  in effect (meaning this test can be used) for the duration of the COVID-19 declaration under Section 564(b)(1) of the Act, 21 U.S.C.section 360bbb-3(b)(1), unless the authorization is terminated  or revoked sooner.       Influenza A by PCR NEGATIVE NEGATIVE Final   Influenza B by PCR NEGATIVE NEGATIVE Final    Comment: (NOTE) The Xpert  Xpress SARS-CoV-2/FLU/RSV plus assay is intended as an aid in the diagnosis of influenza from Nasopharyngeal swab specimens and should not be used as a sole basis for treatment. Nasal washings and aspirates are unacceptable for Xpert Xpress SARS-CoV-2/FLU/RSV testing.  Fact Sheet for Patients: EntrepreneurPulse.com.au  Fact Sheet for Healthcare Providers: IncredibleEmployment.be  This test is not yet approved or cleared by the [place] FDA and has been authorized for detection and/or diagnosis of SARS-CoV-2 by FDA under an Emergency Use Authorization (EUA). This EUA will remain in effect (meaning this test can be used) for the duration of the COVID-19 declaration under Section 564(b)(1) of the Act, 21 U.S.C. section 360bbb-3(b)(1), unless the authorization is terminated or revoked.  Performed at [facility], [place] [address], [place], [place] [address]   Body fluid culture     Status: None   Collection Time: 07/09/20 10:00 PM   Specimen: Synovium; Body Fluid  Result Value Ref Range Status   Specimen Description   Final    SYNOVIAL LEFT SHOULDER Performed at [facility] [address] [place], [place] [address]    Special Requests   Final    NONE Performed at [facility], [place] [address], [place], [place] [address]    Gram Stain   Final  MODERATE WBC PRESENT, PREDOMINANTLY PMN NO ORGANISMS SEEN RESULT CALLED TO, READ BACK BY AND VERIFIED WITH: RN J DAVISON AT 2251 07/09/20 CRUICKSHANK A Performed at [facility], [place] [address], [place], [place] [address]    Culture   Final    RARE GROUP B STREP(S.AGALACTIAE)ISOLATED TESTING AGAINST S. AGALACTIAE NOT ROUTINELY PERFORMED DUE TO PREDICTABILITY OF AMP/PEN/VAN SUSCEPTIBILITY. CRITICAL RESULT CALLED TO, READ BACK BY AND VERIFIED WITH: A. MANSFIELD RN, AT 1478 07/11/20 BY D. VANHOOK REGARDING GROWTH Performed  at [facility], [place] [address], [place], [place] [address]    Report Status 07/12/2020 FINAL  Final  Urine culture     Status: Abnormal   Collection Time: 07/10/20  1:00 AM   Specimen: In/Out Cath Urine  Result Value Ref Range Status   Specimen Description   Final    IN/OUT CATH URINE Performed at [facility] [address] [place], [place] [address]    Special Requests   Final    NONE Performed at [facility], [place] [address], [place], [place] [address]    Culture (A)  Final    10,000 COLONIES/mL PSEUDOMONAS AERUGINOSA 10,000 COLONIES/mL STREPTOCOCCUS AGALACTIAE TESTING AGAINST S. AGALACTIAE NOT ROUTINELY PERFORMED DUE TO PREDICTABILITY OF AMP/PEN/VAN SUSCEPTIBILITY. Performed at [facility], [place] [address], [place], [place] [address]    Report Status 07/12/2020 FINAL  Final   Organism ID, Bacteria PSEUDOMONAS AERUGINOSA (A)  Final      Susceptibility   Pseudomonas aeruginosa - MIC*    CEFTAZIDIME 2 SENSITIVE Sensitive     CIPROFLOXACIN <=0.25 SENSITIVE Sensitive     GENTAMICIN <=1 SENSITIVE Sensitive     IMIPENEM 2 SENSITIVE Sensitive     PIP/TAZO <=4 SENSITIVE Sensitive     CEFEPIME 2 SENSITIVE Sensitive     * 10,000 COLONIES/mL PSEUDOMONAS AERUGINOSA  MRSA PCR Screening     Status: None   Collection Time: 07/10/20  1:15 AM   Specimen: Nasopharyngeal  Result Value Ref Range Status   MRSA by PCR NEGATIVE NEGATIVE Final    Comment:        The GeneXpert MRSA Assay (FDA approved for NASAL specimens only), is one component of a comprehensive MRSA colonization surveillance program. It is not intended to diagnose MRSA infection nor to guide or monitor treatment for MRSA infections. Performed at [facility], [place] [address] [place], [place] [address]   Culture, blood (routine x 2)     Status: None   Collection Time: 07/11/20  9:55 AM   Specimen: BLOOD  Result Value Ref Range Status   Specimen  Description   Final    BLOOD LEFT ANTECUBITAL Performed at [facility] [address], [place], [place] [address]    Special Requests   Final    BOTTLES DRAWN AEROBIC ONLY Blood Culture adequate volume Performed at [facility] [address], [place], [place] [address]    Culture   Final    NO GROWTH 5 DAYS Performed at [facility], [place] [address] [place], [place] [address]    Report Status 07/16/2020 FINAL  Final  Culture, blood (routine x 2)     Status: None   Collection Time: 07/11/20 10:01 AM   Specimen: BLOOD LEFT HAND  Result Value Ref Range Status   Specimen Description   Final    BLOOD LEFT HAND Performed at [facility] [address], [place], [place] [address]  Special Requests   Final    BOTTLES DRAWN AEROBIC ONLY Blood Culture adequate volume Performed at [facility] [address], [place], [place] [address]    Culture   Final    NO GROWTH 5 DAYS Performed at [facility], [place] [address] [place], [place] [address]    Report Status 07/16/2020 FINAL  Final  Body fluid culture     Status: None   Collection Time: 07/13/20  8:45 AM   Specimen: Joint, Right Shoulder; Synovial Fluid  Result Value Ref Range Status   Specimen Description   Final    JOINT FLUID Performed at [facility], [place] [address], [place], [place] [address]    Special Requests   Final    RIGHT SHOULDER Performed at [facility] [address], [place], [place] [address]    Gram Stain   Final    RARE WBC PRESENT, PREDOMINANTLY MONONUCLEAR NO ORGANISMS SEEN    Culture   Final    RARE PSEUDOMONAS AERUGINOSA CRITICAL RESULT CALLED TO, READ BACK BY AND VERIFIED WITH: Bernette Mayers RN, AT [phone] 07/17/20 BY D. [place] Lain REGARDING CULTURE GROWTH Performed at [facility], [place] [address], [place],  [address]    Report Status 07/17/2020 FINAL  Final    Organism ID, Bacteria PSEUDOMONAS AERUGINOSA  Final      Susceptibility   Pseudomonas aeruginosa - MIC*    CEFTAZIDIME 2 SENSITIVE Sensitive     CIPROFLOXACIN <=0.25 SENSITIVE Sensitive     GENTAMICIN <=1 SENSITIVE Sensitive     IMIPENEM 1 SENSITIVE Sensitive     PIP/TAZO <=4 SENSITIVE Sensitive     CEFEPIME 2 SENSITIVE Sensitive     * RARE PSEUDOMONAS AERUGINOSA         Radiology Studies: MR SHOULDER RIGHT WO CONTRAST  Result Date: 07/15/2020 CLINICAL DATA:  Right shoulder pain. History of septic right shoulder joint. Status post washout, most recently 07/10/2020. EXAM: MRI OF THE RIGHT SHOULDER WITHOUT CONTRAST TECHNIQUE: Multiplanar, multisequence MR imaging of the right shoulder was performed. No intravenous contrast was administered. COMPARISON:  CT right shoulder 06/18/2020. FINDINGS: No sagittal T2 weighted series is provided. The patient refused additional scanning. Bones/Joint/Cartilage As on the prior CT scan, the glenoid is markedly thinned and remodeled. There is marrow edema in the subarticular glenoid and glenoid neck. Patchy marrow edema is also seen in the humeral head and neck. Erosions in the lateral periphery of the humeral head are also again identified. There is some susceptibility artifact in the posterior greater tuberosity. The patient has a large glenohumeral joint which extravasates through massive rotator cuff tears into the subacromial/subdeltoid bursa. Structure thought to represent the superior labrum is detached from the glenoid and lying in the glenoid fossa. No normal-appearing labrum is identified. The acromioclavicular joint appears intact but there is destructive change about it and marrow edema in both the clavicular head and acromion. Ligaments Negative. Muscles and Tendons Massive rotator cuff tears are noted. There is fatty atrophy of musculature about the shoulder. A fluid collection in the subscapularis muscle belly measuring 1.6 cm transverse x 1 cm AP x  1 cm craniocaudal is identified. Fluid also dissects out of the glenohumeral joint into the infraspinatus muscle belly. Musculature about the shoulder demonstrates increased T2 signal. Soft tissues Extensive edema is seen in the axillary recess and inferior to the structure thought to represent the inferior glenohumeral ligament. Below the inferior glenohumeral ligament, there is a heterogeneous fluid  collection measuring approximately 3.3 cm AP by at least 2 cm craniocaudal by 1.6 cm transverse. IMPRESSION: Very large glenohumeral joint effusion extravasating into the subacromial/subdeltoid bursa and patchy marrow edema in the humeral head and neck are consistent with a septic glenohumeral joint and osteomyelitis. Marrow edema and destructive change about the acromioclavicular joint most compatible with septic joint and osteomyelitis. Edema in rotator cuff muscles most worrisome for myositis. Small fluid collection in the subscapularis is worrisome for pyomyositis. Marked edema in the axilla. A fluid collection which appears to be inferior to the inferior glenohumeral ligament is incompletely visualized but worrisome for soft tissue abscess. Massive rotator cuff tear. Electronically Signed   By: Inge Rise M.D.   On: 07/15/2020 16:18        Scheduled Meds: . aspirin EC  81 mg Oral Daily  . buPROPion  300 mg Oral Daily  . carvedilol  6.25 mg Oral BID WC  . Chlorhexidine Gluconate Cloth  6 each Topical Daily  . docusate sodium  100 mg Oral BID  . enoxaparin (LOVENOX) injection  40 mg Subcutaneous Q24H  . gabapentin  400 mg Oral Daily  . gabapentin  800 mg Oral QHS  . insulin aspart  0-15 Units Subcutaneous Q4H  . mouth rinse  15 mL Mouth Rinse q12n4p  . mouth rinse  15 mL Mouth Rinse BID  . pantoprazole  40 mg Oral Daily  . rOPINIRole  1 mg Oral TID  . tamsulosin  0.4 mg Oral Daily  . ticagrelor  90 mg Oral BID   Continuous Infusions: . sodium chloride Stopped (07/10/20 0748)  .  ceFEPime (MAXIPIME) IV Stopped (07/17/20 0540)     LOS: 8 days    Time spent: Dulce, MD [facility] Pager [phone]  If 7PM-7AM, please contact night-coverage [email or web address] Password [email or web address] 07/17/2020, 12:50 PM

## 2020-07-17 NOTE — Progress Notes (Signed)
Subjective:     Patient wants to eat or go home so he can eat apparently   Antibiotics:  Anti-infectives (From admission, onward)   Start     Dose/Rate Route Frequency Ordered Stop   07/16/20 0900  ceFEPIme (MAXIPIME) 2 g in sodium chloride 0.9 % 100 mL IVPB        2 g 200 mL/hr over 30 Minutes Intravenous Every 8 hours 07/16/20 0805     07/12/20 1800  penicillin G potassium 12 Million Units in dextrose 5 % 500 mL continuous infusion  Status:  Discontinued        12 Million Units 41.7 mL/hr over 12 Hours Intravenous Every 12 hours 07/12/20 1100 07/16/20 0805   07/11/20 1800  cefTRIAXone (ROCEPHIN) 2 g in sodium chloride 0.9 % 100 mL IVPB  Status:  Discontinued        2 g 200 mL/hr over 30 Minutes Intravenous Every 24 hours 07/11/20 1235 07/12/20 1100   07/10/20 2200  cefTRIAXone (ROCEPHIN) 2 g in sodium chloride 0.9 % 100 mL IVPB  Status:  Discontinued        2 g 200 mL/hr over 30 Minutes Intravenous Every 24 hours 07/09/20 2347 07/10/20 0926   07/10/20 1000  vancomycin (VANCOCIN) IVPB 1000 mg/200 mL premix  Status:  Discontinued        1,000 mg 200 mL/hr over 60 Minutes Intravenous Every 12 hours 07/10/20 0033 07/11/20 1235   07/10/20 1000  ceFEPIme (MAXIPIME) 2 g in sodium chloride 0.9 % 100 mL IVPB  Status:  Discontinued        2 g 200 mL/hr over 30 Minutes Intravenous Every 8 hours 07/10/20 0939 07/11/20 1235   07/09/20 2100  vancomycin (VANCOREADY) IVPB 1750 mg/350 mL        1,750 mg 175 mL/hr over 120 Minutes Intravenous STAT 07/09/20 2045 07/09/20 2333   07/09/20 2045  vancomycin (VANCOCIN) IVPB 1000 mg/200 mL premix  Status:  Discontinued        1,000 mg 200 mL/hr over 60 Minutes Intravenous  Once 07/09/20 2041 07/09/20 2045   07/09/20 2045  cefTRIAXone (ROCEPHIN) 2 g in sodium chloride 0.9 % 100 mL IVPB        2 g 200 mL/hr over 30 Minutes Intravenous  Once 07/09/20 2041 07/09/20 2209      Medications: Scheduled Meds: . aspirin EC  81 mg Oral Daily  .  buPROPion  300 mg Oral Daily  . carvedilol  6.25 mg Oral BID WC  . Chlorhexidine Gluconate Cloth  6 each Topical Daily  . docusate sodium  100 mg Oral BID  . enoxaparin (LOVENOX) injection  40 mg Subcutaneous Q24H  . gabapentin  400 mg Oral Daily  . gabapentin  800 mg Oral QHS  . insulin aspart  0-15 Units Subcutaneous Q4H  . mouth rinse  15 mL Mouth Rinse q12n4p  . mouth rinse  15 mL Mouth Rinse BID  . pantoprazole  40 mg Oral Daily  . rOPINIRole  1 mg Oral TID  . tamsulosin  0.4 mg Oral Daily  . ticagrelor  90 mg Oral BID   Continuous Infusions: . sodium chloride Stopped (07/10/20 0748)  . ceFEPime (MAXIPIME) IV Stopped (07/17/20 0540)   PRN Meds:.acetaminophen, albuterol, bisacodyl, guaiFENesin, magnesium citrate, menthol-cetylpyridinium **OR** phenol, metoCLOPramide **OR** metoCLOPramide (REGLAN) injection, nitroGLYCERIN, ondansetron **OR** ondansetron (ZOFRAN) IV, oxyCODONE, polyethylene glycol    Objective: Weight change:   Intake/Output Summary (Last 24 hours) at 07/17/2020  [place] filed at 07/17/2020 0540 Gross per 24 hour  Intake 200 ml  Output 2450 ml  Net -2250 ml   Blood pressure (!) 138/107, pulse 77, temperature 98.6 F (37 C), temperature source Oral, resp. rate 20, height 6' (1.829 m), weight 91 kg, SpO2 100 %. Temp:  [98 F (36.7 C)-99.6 F (37.6 C)] 98.6 F (37 C) (12/02 0800) Pulse Rate:  [77-128] 77 (12/02 0800) Resp:  [14-22] 20 (12/02 0800) BP: (121-155)/(47-110) 138/107 (12/02 0800) SpO2:  [91 %-100 %] 100 % (12/02 0800)  Physical Exam: General: Alert and awake, oriented x3 bandage HEENT: anicteric sclera, EOMI CVS regular rate, normal  Chest: , no wheezing, no respiratory distress Abdomen: soft non-distended,  Extremities: left shoulder with dressing erythema on arm, right shoulder is tender to palpation  Skin: no rashes Neuro: nonfocal  CBC:    BMET Recent Labs    07/17/20 0314  NA 130*  K 4.1  CL 95*  CO2 29  GLUCOSE  152*  BUN 14  CREATININE 0.84  CALCIUM 8.2*     Liver Panel  Recent Labs    07/17/20 0314  PROT 6.3*  ALBUMIN 2.3*  AST 18  ALT 25  ALKPHOS 103  BILITOT 0.5       Sedimentation Rate No results for input(s): ESRSEDRATE in the last 72 hours. C-Reactive Protein No results for input(s): CRP in the last 72 hours.  Micro Results: Recent Results (from the past 720 hour(s))  Anaerobic and Aerobic Culture     Status: None   Collection Time: 06/24/20 11:33 AM   Specimen: Joint, Left Shoulder; Synovial Fluid  Result Value Ref Range Status   MICRO NUMBER: [id number]  Final   SPECIMEN QUALITY: Adequate  Final   Source: FLUID, SYNOVIAL  Final   STATUS: FINAL  Final   GRAM STAIN:   Final    Many Polymorphonuclear leukocytes No organisms seen   ANA RESULT: No anaerobes isolated.  Final   MICRO NUMBER: [id number]  Final   SPECIMEN QUALITY: Adequate  Final   SOURCE: FLUID, SYNOVIAL  Final   STATUS: FINAL  Final   AER RESULT: No Growth  Final  Blood Culture (routine x 2)     Status: None   Collection Time: 07/09/20  9:05 PM   Specimen: BLOOD LEFT FOREARM  Result Value Ref Range Status   Specimen Description   Final    BLOOD LEFT FOREARM Performed at [facility] [address], [place], [place] [address]    Special Requests   Final    BOTTLES DRAWN AEROBIC AND ANAEROBIC Blood Culture results may not be optimal due to an excessive volume of blood received in culture bottles Performed at [facility] [address] [place], [place] [address]    Culture   Final    NO GROWTH 5 DAYS Performed at [facility], [place] [address] [place], [place] [address]    Report Status 07/15/2020 FINAL  Final  Blood Culture (routine x 2)     Status: Abnormal   Collection Time: 07/09/20  9:05 PM   Specimen: BLOOD RIGHT FOREARM  Result Value Ref Range Status   Specimen Description   Final    BLOOD RIGHT FOREARM Performed at [name] [address], [place],  [address]    Special Requests   Final    BOTTLES DRAWN AEROBIC AND ANAEROBIC Blood Culture adequate volume Performed at [facility], [address] [place], [place] [address]    Culture  Setup Time   Final    GRAM POSITIVE COCCI IN CHAINS IN BOTH AEROBIC AND ANAEROBIC BOTTLES CRITICAL RESULT CALLED TO, READ BACK BY AND VERIFIED WITHDomenick Gong PHARMD 1631 07/10/20 A BROWNING Performed at [facility], [place] [address], [place], [place] [address]    Culture GROUP B STREP(S.AGALACTIAE)ISOLATED (A)  Final   Report Status 07/12/2020 FINAL  Final   Organism ID, Bacteria GROUP B STREP(S.AGALACTIAE)ISOLATED  Final      Susceptibility   Group b strep(s.agalactiae)isolated - MIC*    CLINDAMYCIN >=1 RESISTANT Resistant     AMPICILLIN <=0.25 SENSITIVE Sensitive     ERYTHROMYCIN >=8 RESISTANT Resistant     VANCOMYCIN 0.5 SENSITIVE Sensitive     CEFTRIAXONE <=0.12 SENSITIVE Sensitive     LEVOFLOXACIN 0.5 SENSITIVE Sensitive     PENICILLIN Value in next row Sensitive      SENSITIVE0.06    * GROUP B STREP(S.AGALACTIAE)ISOLATED  Blood Culture ID Panel (Reflexed)     Status: Abnormal   Collection Time: 07/09/20  9:05 PM  Result Value Ref Range Status   Enterococcus faecalis NOT DETECTED NOT DETECTED Final   Enterococcus Faecium NOT DETECTED NOT DETECTED Final   Listeria monocytogenes NOT DETECTED NOT DETECTED Final   Staphylococcus species NOT DETECTED NOT DETECTED Final   Staphylococcus aureus (BCID) NOT DETECTED NOT DETECTED Final   Staphylococcus epidermidis NOT DETECTED NOT DETECTED Final   Staphylococcus lugdunensis NOT DETECTED NOT DETECTED Final   Streptococcus species DETECTED (A) NOT DETECTED Final    Comment: CRITICAL RESULT CALLED TO, READ BACK BY AND VERIFIED WITH: S CHRISTY PHARMD 1631 07/10/20 A BROWNING    Streptococcus agalactiae DETECTED (A) NOT DETECTED Final    Comment: CRITICAL RESULT CALLED TO, READ BACK  BY AND VERIFIED WITH: S CHRISTY PHARMD 1631 07/10/20 A BROWNING    Streptococcus pneumoniae NOT DETECTED NOT DETECTED Final   Streptococcus pyogenes NOT DETECTED NOT DETECTED Final   A.calcoaceticus-baumannii NOT DETECTED NOT DETECTED Final   Bacteroides fragilis NOT DETECTED NOT DETECTED Final   Enterobacterales NOT DETECTED NOT DETECTED Final   Enterobacter cloacae complex NOT DETECTED NOT DETECTED Final   Escherichia coli NOT DETECTED NOT DETECTED Final   Klebsiella aerogenes NOT DETECTED NOT DETECTED Final   Klebsiella oxytoca NOT DETECTED NOT DETECTED Final   Klebsiella pneumoniae NOT DETECTED NOT DETECTED Final   Proteus species NOT DETECTED NOT DETECTED Final   Salmonella species NOT DETECTED NOT DETECTED Final   Serratia marcescens NOT DETECTED NOT DETECTED Final   Haemophilus influenzae NOT DETECTED NOT DETECTED Final   Neisseria meningitidis NOT DETECTED NOT DETECTED Final   Pseudomonas aeruginosa NOT DETECTED NOT DETECTED Final   Stenotrophomonas maltophilia NOT DETECTED NOT DETECTED Final   Candida albicans NOT DETECTED NOT DETECTED Final   Candida auris NOT DETECTED NOT DETECTED Final   Candida glabrata NOT DETECTED NOT DETECTED Final   Candida krusei NOT DETECTED NOT DETECTED Final   Candida parapsilosis NOT DETECTED NOT DETECTED Final   Candida tropicalis NOT DETECTED NOT DETECTED Final   Cryptococcus neoformans/gattii NOT DETECTED NOT DETECTED Final    Comment: Performed at [facility], [place] [address] [place], [place] [address]  Resp Panel by RT-PCR (Flu A&B, Covid) Nasopharyngeal Swab     Status: None   Collection Time: 07/09/20  9:06 PM   Specimen: Nasopharyngeal Swab; Nasopharyngeal(NP) swabs in vial transport medium  Result Value Ref Range Status   SARS  Coronavirus 2 by RT PCR NEGATIVE NEGATIVE Final    Comment: (NOTE) SARS-CoV-2 target nucleic acids are NOT DETECTED.  The SARS-CoV-2 RNA is generally detectable in upper respiratory specimens during the  acute phase of infection. The lowest concentration of SARS-CoV-2 viral copies this assay can detect is 138 copies/mL. A negative result does not preclude SARS-Cov-2 infection and should not be used as the sole basis for treatment or other patient management decisions. A negative result may occur with  improper specimen collection/handling, submission of specimen other than nasopharyngeal swab, presence of viral mutation(s) within the areas targeted by this assay, and inadequate number of viral copies(<138 copies/mL). A negative result must be combined with clinical observations, patient history, and epidemiological information. The expected result is Negative.  Fact Sheet for Patients:  EntrepreneurPulse.com.au  Fact Sheet for Healthcare Providers:  IncredibleEmployment.be  This test is no t yet approved or cleared by the [place] FDA and  has been authorized for detection and/or diagnosis of SARS-CoV-2 by FDA under an Emergency Use Authorization (EUA). This EUA will remain  in effect (meaning this test can be used) for the duration of the COVID-19 declaration under Section 564(b)(1) of the Act, 21 U.S.C.section 360bbb-3(b)(1), unless the authorization is terminated  or revoked sooner.       Influenza A by PCR NEGATIVE NEGATIVE Final   Influenza B by PCR NEGATIVE NEGATIVE Final    Comment: (NOTE) The Xpert Xpress SARS-CoV-2/FLU/RSV plus assay is intended as an aid in the diagnosis of influenza from Nasopharyngeal swab specimens and should not be used as a sole basis for treatment. Nasal washings and aspirates are unacceptable for Xpert Xpress SARS-CoV-2/FLU/RSV testing.  Fact Sheet for Patients: EntrepreneurPulse.com.au  Fact Sheet for Healthcare Providers: IncredibleEmployment.be  This test is not yet approved or cleared by the [place] FDA and has been authorized for detection and/or  diagnosis of SARS-CoV-2 by FDA under an Emergency Use Authorization (EUA). This EUA will remain in effect (meaning this test can be used) for the duration of the COVID-19 declaration under Section 564(b)(1) of the Act, 21 U.S.C. section 360bbb-3(b)(1), unless the authorization is terminated or revoked.  Performed at [facility], [place] [address], [place], [place] [address]   Body fluid culture     Status: None   Collection Time: 07/09/20 10:00 PM   Specimen: Synovium; Body Fluid  Result Value Ref Range Status   Specimen Description   Final    SYNOVIAL LEFT SHOULDER Performed at [facility] [address], [place], [place] [address]    Special Requests   Final    NONE Performed at [facility], [place] [address] [place], [place] [address]    Gram Stain   Final    MODERATE WBC PRESENT, PREDOMINANTLY PMN NO ORGANISMS SEEN RESULT CALLED TO, READ BACK BY AND VERIFIED WITH: RN J DAVISON AT 2251 07/09/20 CRUICKSHANK A Performed at [facility], [place] [address] [place], [place] [address]    Culture   Final    RARE GROUP B STREP(S.AGALACTIAE)ISOLATED TESTING AGAINST S. AGALACTIAE NOT ROUTINELY PERFORMED DUE TO PREDICTABILITY OF AMP/PEN/VAN SUSCEPTIBILITY. CRITICAL RESULT CALLED TO, READ BACK BY AND VERIFIED WITH: A. MANSFIELD RN, AT 2563 07/11/20 BY D. VANHOOK REGARDING GROWTH Performed at [facility], [place] [address], [place],  [address]    Report Status 07/12/2020 FINAL  Final  Urine culture     Status: Abnormal   Collection Time: 07/10/20  1:00 AM  Specimen: In/Out Cath Urine  Result Value Ref Range Status   Specimen Description   Final    IN/OUT CATH URINE Performed at [facility] [address], [place], [place] [address]    Special Requests   Final    NONE Performed at [facility], [place] [address], [place], [place] [address]    Culture (A)  Final     10,000 COLONIES/mL PSEUDOMONAS AERUGINOSA 10,000 COLONIES/mL STREPTOCOCCUS AGALACTIAE TESTING AGAINST S. AGALACTIAE NOT ROUTINELY PERFORMED DUE TO PREDICTABILITY OF AMP/PEN/VAN SUSCEPTIBILITY. Performed at [facility], [place] [address] [place], [place] [address]    Report Status 07/12/2020 FINAL  Final   Organism ID, Bacteria PSEUDOMONAS AERUGINOSA (A)  Final      Susceptibility   Pseudomonas aeruginosa - MIC*    CEFTAZIDIME 2 SENSITIVE Sensitive     CIPROFLOXACIN <=0.25 SENSITIVE Sensitive     GENTAMICIN <=1 SENSITIVE Sensitive     IMIPENEM 2 SENSITIVE Sensitive     PIP/TAZO <=4 SENSITIVE Sensitive     CEFEPIME 2 SENSITIVE Sensitive     * 10,000 COLONIES/mL PSEUDOMONAS AERUGINOSA  MRSA PCR Screening     Status: None   Collection Time: 07/10/20  1:15 AM   Specimen: Nasopharyngeal  Result Value Ref Range Status   MRSA by PCR NEGATIVE NEGATIVE Final    Comment:        The GeneXpert MRSA Assay (FDA approved for NASAL specimens only), is one component of a comprehensive MRSA colonization surveillance program. It is not intended to diagnose MRSA infection nor to guide or monitor treatment for MRSA infections. Performed at [facility], [place] [address], [place], [place] [address]   Culture, blood (routine x 2)     Status: None   Collection Time: 07/11/20  9:55 AM   Specimen: BLOOD  Result Value Ref Range Status   Specimen Description   Final    BLOOD LEFT ANTECUBITAL Performed at [facility] [address], [place], [place] [address]    Special Requests   Final    BOTTLES DRAWN AEROBIC ONLY Blood Culture adequate volume Performed at [facility] [address], [place], [place] [address]    Culture   Final    NO GROWTH 5 DAYS Performed at [facility], [place] [address], [place], [place] [address]    Report Status 07/16/2020 FINAL  Final  Culture, blood (routine x 2)     Status: None   Collection  Time: 07/11/20 10:01 AM   Specimen: BLOOD LEFT HAND  Result Value Ref Range Status   Specimen Description   Final    BLOOD LEFT HAND Performed at [facility] [address] [place], [place] [address]    Special Requests   Final    BOTTLES DRAWN AEROBIC ONLY Blood Culture adequate volume Performed at [facility] [address] [place], [place] [address]    Culture   Final    NO GROWTH 5 DAYS Performed at [facility], [place] [address], [place], [place] [address]    Report Status 07/16/2020 FINAL  Final  Body fluid culture     Status: None   Collection Time: 07/13/20  8:45 AM   Specimen: Joint, Right Shoulder; Synovial Fluid  Result Value Ref Range Status   Specimen Description   Final    JOINT FLUID Performed at [facility], [place] [address] [place], [place] [address]  Special Requests   Final    RIGHT SHOULDER Performed at [facility], [place] [address], [place], [place] [address]    Gram Stain   Final    RARE WBC PRESENT, PREDOMINANTLY MONONUCLEAR NO ORGANISMS SEEN    Culture   Final    RARE PSEUDOMONAS AERUGINOSA CRITICAL RESULT CALLED TO, READ BACK BY AND VERIFIED WITH: Bernette Mayers RN, AT [phone] 07/17/20 BY D. Victoriano Lain REGARDING CULTURE GROWTH Performed at [facility], [place] [address], [place],  [address]    Report Status 07/17/2020 FINAL  Final   Organism ID, Bacteria PSEUDOMONAS AERUGINOSA  Final      Susceptibility   Pseudomonas aeruginosa - MIC*    CEFTAZIDIME 2 SENSITIVE Sensitive     CIPROFLOXACIN <=0.25 SENSITIVE Sensitive     GENTAMICIN <=1 SENSITIVE Sensitive     IMIPENEM 1 SENSITIVE Sensitive     PIP/TAZO <=4 SENSITIVE Sensitive     CEFEPIME 2 SENSITIVE Sensitive     * RARE PSEUDOMONAS AERUGINOSA    Studies/Results: MR SHOULDER RIGHT WO CONTRAST  Result Date: 07/15/2020 CLINICAL DATA:  Right shoulder pain. History of septic right shoulder joint. Status post  washout, most recently 07/10/2020. EXAM: MRI OF THE RIGHT SHOULDER WITHOUT CONTRAST TECHNIQUE: Multiplanar, multisequence MR imaging of the right shoulder was performed. No intravenous contrast was administered. COMPARISON:  CT right shoulder 06/18/2020. FINDINGS: No sagittal T2 weighted series is provided. The patient refused additional scanning. Bones/Joint/Cartilage As on the prior CT scan, the glenoid is markedly thinned and remodeled. There is marrow edema in the subarticular glenoid and glenoid neck. Patchy marrow edema is also seen in the humeral head and neck. Erosions in the lateral periphery of the humeral head are also again identified. There is some susceptibility artifact in the posterior greater tuberosity. The patient has a large glenohumeral joint which extravasates through massive rotator cuff tears into the subacromial/subdeltoid bursa. Structure thought to represent the superior labrum is detached from the glenoid and lying in the glenoid fossa. No normal-appearing labrum is identified. The acromioclavicular joint appears intact but there is destructive change about it and marrow edema in both the clavicular head and acromion. Ligaments Negative. Muscles and Tendons Massive rotator cuff tears are noted. There is fatty atrophy of musculature about the shoulder. A fluid collection in the subscapularis muscle belly measuring 1.6 cm transverse x 1 cm AP x 1 cm craniocaudal is identified. Fluid also dissects out of the glenohumeral joint into the infraspinatus muscle belly. Musculature about the shoulder demonstrates increased T2 signal. Soft tissues Extensive edema is seen in the axillary recess and inferior to the structure thought to represent the inferior glenohumeral ligament. Below the inferior glenohumeral ligament, there is a heterogeneous fluid collection measuring approximately 3.3 cm AP by at least 2 cm craniocaudal by 1.6 cm transverse. IMPRESSION: Very large glenohumeral joint effusion  extravasating into the subacromial/subdeltoid bursa and patchy marrow edema in the humeral head and neck are consistent with a septic glenohumeral joint and osteomyelitis. Marrow edema and destructive change about the acromioclavicular joint most compatible with septic joint and osteomyelitis. Edema in rotator cuff muscles most worrisome for myositis. Small fluid collection in the subscapularis is worrisome for pyomyositis. Marked edema in the axilla. A fluid collection which appears to be inferior to the inferior glenohumeral ligament is incompletely visualized but worrisome for soft tissue abscess. Massive rotator cuff tear. Electronically Signed   By: Inge Rise M.D.   On: 07/15/2020 16:18  Assessment/Plan:  INTERVAL   MRI of shoulder performed yesterday  Active Problems:   Peripheral artery disease (HCC)   Hx of CABG   Septic shock (HCC)   Shortness of breath   Status post aortobifemoral bypass surgery   History of carotid endarterectomy   Type 2 diabetes mellitus with hyperlipidemia (HCC)   Diabetes mellitus type II, non insulin dependent (HCC)   Acute respiratory failure (HCC)   Pyogenic arthritis of left shoulder region [facility])   Acute pain of right shoulder   Sepsis due to group B Streptococcus ([place])    [name] is a 74 y.o. male with Group B streptococcal bacteremia and septic shock due to left septic shoulder sp I and D. He has had multiple shoulder aspirations with elevated WBC in them. He also had a right shoulder aspirate performed yesterday with 16900 WBC, 97% PMNs.   It is now growing  PSEUDOMONAS  Additionally MRI done yesterday shows:  Very large glenohumeral joint effusion extravasating into the subacromial/subdeltoid bursa and patchy marrow edema in the humeral head and neck are consistent with a septic glenohumeral joint and osteomyelitis.  Marrow edema and destructive change about the acromioclavicular joint most compatible with septic  joint and osteomyelitis.  Edema in rotator cuff muscles most worrisome for myositis. Small fluid collection in the subscapularis is worrisome for pyomyositis.  Marked edema in the axilla. A fluid collection which appears to be inferior to the inferior glenohumeral ligament is incompletely visualized but worrisome for soft tissue abscess.  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, septic AC joint, osteomyelitis, pyomyositis  He is on cefepime  I discussed MRI findings with Dr Onnie Graham who is taking the patient to the OR for I and d of right shoulder and aspirate of the left shoulder  Would recommend getting further intra-operative cultures  Given the bone changes on MRI he will need protracted IV and then po abx  I am hoping that the GBS is more the driver of pathology than the pseudomonas but we have the latter on aspirate from shoulder  I am not thrilled by prospect of giving this man chronic FQ therapy but we may be stuck with that in oral phase of abx      LOS: 8 days   Alcide Evener 07/17/2020, 10:50 AM

## 2020-07-17 NOTE — Transfer of Care (Signed)
Immediate Anesthesia Transfer of Care Note  Patient: [name]  Procedure(s) Performed: Right shoulder arthroscopic lavage; Left shoulder aspiration. (Bilateral Shoulder)  Patient Location: PACU  Anesthesia Type:General  Level of Consciousness: awake and patient cooperative  Airway & Oxygen Therapy: Patient Spontanous Breathing and Patient connected to face mask  Post-op Assessment: Report given to RN and Post -op Vital signs reviewed and stable  Post vital signs: Reviewed and stable  Last Vitals:  Vitals Value Taken Time  BP 140/52 07/17/20 1839  Temp    Pulse 45 07/17/20 1842  Resp 22 07/17/20 1843  SpO2 82 % 07/17/20 1842  Vitals shown include unvalidated device data.  Last Pain:  Vitals:   07/17/20 1513  TempSrc: Oral  PainSc:       Patients Stated Pain Goal: 2 (54/56/25 6389)  Complications: No complications documented.

## 2020-07-17 NOTE — Anesthesia Procedure Notes (Signed)
Procedure Name: Intubation Performed by: Bobbie Valletta J, CRNA Pre-anesthesia Checklist: Patient identified, Emergency Drugs available, Suction available, Patient being monitored and Timeout performed Patient Re-evaluated:Patient Re-evaluated prior to induction Oxygen Delivery Method: Circle system utilized Preoxygenation: Pre-oxygenation with 100% oxygen Induction Type: IV induction Ventilation: Mask ventilation without difficulty Laryngoscope Size: Mac and 4 Grade View: Grade I Tube type: Oral Tube size: 7.5 mm Number of attempts: 1 Airway Equipment and Method: Stylet Placement Confirmation: ETT inserted through vocal cords under direct vision,  positive ETCO2 and breath sounds checked- equal and bilateral Secured at: 23 cm Tube secured with: Tape Dental Injury: Teeth and Oropharynx as per pre-operative assessment        

## 2020-07-17 NOTE — Op Note (Signed)
07/17/2020  6:50 PM  PATIENT:   [name]  74 y.o. male  PRE-OPERATIVE DIAGNOSIS:  bilateral septic shoulders  POST-OPERATIVE DIAGNOSIS:  1.  Right shoulder septic arthritis  2.  Left shoulder hematoma  PROCEDURE:  1.  Left shoulder percutaneous hematoma evacuation with placement of drain  2.  Right shoulder arthroscopic lavage and extensive debridement with placement of drain  SURGEON:  Delno Blaisdell, Metta Clines. M.D.  ASSISTANTS: Jenetta Loges, PA-C  ANESTHESIA:   General endotracheal and interscalene block with Exparel  EBL: Approximately 50 cc of nonclotting blood was evacuated from the left shoulder.  Right shoulder estimated blood loss minimal  SPECIMEN: None  Drains: Jackson-Pratt drain x1 into each shoulder   PATIENT DISPOSITION:  PACU - hemodynamically stable.    PLAN OF CARE: Admit to inpatient   Brief history:  [name] is a 74 year old gentleman with a number of significant medical comorbidities who has been hospitalized with severe systemic sepsis who now presents with increasing right shoulder pain with recent aspiration show an elevated white count with cultures reportedly growing Pseudomonas.  He is also status post a recent left shoulder arthroscopic lavage with treatment of streptococcal septic arthritis.  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  He is continued to have some moderate left shoulder swelling.  Due to his continued bilateral shoulder difficulties he is brought to the operating this time for planned right shoulder arthroscopic lavage and left shoulder aspiration versus lavage.  Preoperatively had counseled [name] once again regarding the gravity of the situation and the treatment options for the management of his bilateral shoulder conditions.  Possibility of surgical complications were reviewed including bleeding, persistent infection, neurovascular injury, persistence of pain, and possible need for additional  surgery.  He understands, and accepts, and agrees with plan.  Procedure in detail:  After undergoing routine preop evaluation patient was brought to the operating room and placed supine on the operating table and then underwent smooth induction of a general tracheal anesthesia.  It was appropriately padded and protected and placed into the beachchair position.  Both shoulder girdles were made available for draping.  A formal arthroscopic lavage was planned for the right shoulder.  The left shoulder was draped and prepped and after timeout was called I performed an initial aspiration.  The aspiration initially revealed very thickened but nonclotting very dark blood consistent with a hematoma.  Due to the viscosity I used a trocar to gain access into the left shoulder through a lateral approach and inserted a arthroscopic cannula which allowed evacuation of the total approximately 50 cc of old dark blood.  We then utilized a Jackson-Pratt drain which was trimmed to the appropriate length and the wound was inserted percutaneously through our lateral portal and taped into position and a dry dressing was then taped about the left shoulder.  Our attention was then directed to the right shoulder where was sterilely prepped and draped in standard fashion-timeout was called.  Posterior portal established in the shoulder and anterior portal and lateral portals were established.  Arthroscopic lavage and debridement was performed removing a large amount of clotted material with fibrinous exudate and debris.  He was noted to have a chronic rotator cuff tear as well as advanced arthritis.  The joint was copiously irrigated and shaver was used to remove all loose and nonviable debris.  A total of 6 L was irrigated through.  We then again placed a Terrial Rhodes drain through the lateral portal  into the shoulder joint and this was taped into position then a bulky dry dressing was then applied.  At this point the patient was  then once again placed supine extubated and taken to recovery room in stable condition.  Postop plan will be for continued medical management by the critical care service.  Continue with IV antibiotics.  Adjust per the cultures.  Monitor drain output.  Once drain volumes dropped below 25cc/day we will remove the drains.  The drains were not sewn in so care needs to be taken during dressing changes.   Jenetta Loges, PA-C was utilized as an Environmental consultant throughout this case, essential for help with positioning the patient, positioning extremity, tissue manipulation, implantation of the prosthesis, suture management, wound closure, and intraoperative decision-making.  Marin Shutter MD    Contact # [phone]

## 2020-07-17 NOTE — Progress Notes (Signed)
Linell Shawn  MRN: [id number] DOB/Age: 09-17-1945 74 y.o. Physician: Ander Slade, M.D. 7 Days Post-Op Procedure(s) (LRB): SHOULDER ARTHROSCOPY WITH WASHOUT (Left)  Subjective: Patient with continued complaints of bilateral shoulder pain. Vital Signs Temp:  [98 F (36.7 C)-99.6 F (37.6 C)] 98.6 F (37 C) (12/02 0800) Pulse Rate:  [77-128] 77 (12/02 0800) Resp:  [14-22] 20 (12/02 0800) BP: (121-155)/(47-110) 138/107 (12/02 0800) SpO2:  [91 %-100 %] 100 % (12/02 0800)  Lab Results Recent Labs    07/17/20 0314  WBC 11.3*  HGB 8.6*  HCT 27.1*  PLT 376   BMET Recent Labs    07/17/20 0314  NA 130*  K 4.1  CL 95*  CO2 29  GLUCOSE 152*  BUN 14  CREATININE 0.84  CALCIUM 8.2*   INR  Date Value Ref Range Status  07/09/2020 1.3 (H) 0.8 - 1.2 Final    Comment:    (NOTE) INR goal varies based on device and disease states. Performed at [facility], [place] [address], [place], [place] [address]      Exam  Bilateral shoulders demonstrate diffuse tenderness and swelling.  Left significantly more swollen than the right.  Recent right shoulder aspiration has been growing out Pseudomonas.  Left shoulder grew out group B strep.  Plan Anticipate arthroscopic lavage and debridement of right shoulder in OR later this afternoon.  We will also anticipate aspiration and potentially repeat arthroscopic lavage of the left shoulder at that time based on the aspiration findings.  Treatment options reviewed and discussed with the patient. Ersel Enslin M Atwood Adcock 07/17/2020, 12:10 PM   Contact # [phone]

## 2020-07-17 NOTE — Anesthesia Postprocedure Evaluation (Signed)
Anesthesia Post Note  Patient: [name]  Procedure(s) Performed: Right shoulder arthroscopic lavage; Left shoulder aspiration. (Bilateral Shoulder)     Patient location during evaluation: PACU Anesthesia Type: General Level of consciousness: awake and alert Pain management: pain level controlled Vital Signs Assessment: post-procedure vital signs reviewed and stable Respiratory status: spontaneous breathing, nonlabored ventilation, respiratory function stable and patient connected to nasal cannula oxygen Cardiovascular status: blood pressure returned to baseline and stable Postop Assessment: no apparent nausea or vomiting Anesthetic complications: no   No complications documented.  Last Vitals:  Vitals:   07/17/20 1930 07/17/20 2000  BP: 129/61 (!) 116/49  Pulse: 89 92  Resp: (!) 21 (!) 21  Temp: 36.9 C 36.8 C  SpO2:  98%    Last Pain:  Vitals:   07/17/20 2000  TempSrc: Oral  PainSc:                  [name] S

## 2020-07-18 ENCOUNTER — Encounter (HOSPITAL_COMMUNITY): Payer: Self-pay | Admitting: Orthopedic Surgery

## 2020-07-18 ENCOUNTER — Inpatient Hospital Stay (HOSPITAL_COMMUNITY): Payer: Medicare Other

## 2020-07-18 HISTORY — PX: IR US GUIDE VASC ACCESS RIGHT: IMG2390

## 2020-07-18 HISTORY — PX: IR FLUORO GUIDE CV LINE RIGHT: IMG2283

## 2020-07-18 LAB — CBC WITH DIFFERENTIAL/PLATELET
Abs Immature Granulocytes: 0.18 10*3/uL — ABNORMAL HIGH (ref 0.00–0.07)
Basophils Absolute: 0 10*3/uL (ref 0.0–0.1)
Basophils Relative: 0 %
Eosinophils Absolute: 0 10*3/uL (ref 0.0–0.5)
Eosinophils Relative: 0 %
HCT: 27.9 % — ABNORMAL LOW (ref 39.0–52.0)
Hemoglobin: 8.8 g/dL — ABNORMAL LOW (ref 13.0–17.0)
Immature Granulocytes: 2 %
Lymphocytes Relative: 5 %
Lymphs Abs: 0.5 10*3/uL — ABNORMAL LOW (ref 0.7–4.0)
MCH: 29.5 pg (ref 26.0–34.0)
MCHC: 31.5 g/dL (ref 30.0–36.0)
MCV: 93.6 fL (ref 80.0–100.0)
Monocytes Absolute: 0.6 10*3/uL (ref 0.1–1.0)
Monocytes Relative: 6 %
Neutro Abs: 8.9 10*3/uL — ABNORMAL HIGH (ref 1.7–7.7)
Neutrophils Relative %: 87 %
Platelets: 400 10*3/uL (ref 150–400)
RBC: 2.98 MIL/uL — ABNORMAL LOW (ref 4.22–5.81)
RDW: 17.1 % — ABNORMAL HIGH (ref 11.5–15.5)
WBC: 10.2 10*3/uL (ref 4.0–10.5)
nRBC: 0 % (ref 0.0–0.2)

## 2020-07-18 LAB — COMPREHENSIVE METABOLIC PANEL
ALT: 20 U/L (ref 0–44)
AST: 15 U/L (ref 15–41)
Albumin: 2.2 g/dL — ABNORMAL LOW (ref 3.5–5.0)
Alkaline Phosphatase: 88 U/L (ref 38–126)
Anion gap: 10 (ref 5–15)
BUN: 13 mg/dL (ref 8–23)
CO2: 23 mmol/L (ref 22–32)
Calcium: 8.2 mg/dL — ABNORMAL LOW (ref 8.9–10.3)
Chloride: 98 mmol/L (ref 98–111)
Creatinine, Ser: 0.61 mg/dL (ref 0.61–1.24)
GFR, Estimated: >60 mL/min (ref >60)
Glucose, Bld: 152 mg/dL — ABNORMAL HIGH (ref 70–99)
Potassium: 4.3 mmol/L (ref 3.5–5.1)
Sodium: 131 mmol/L — ABNORMAL LOW (ref 135–145)
Total Bilirubin: 0.6 mg/dL (ref 0.3–1.2)
Total Protein: 6 g/dL — ABNORMAL LOW (ref 6.5–8.1)

## 2020-07-18 LAB — GLUCOSE, CAPILLARY
Glucose-Capillary: 118 mg/dL — ABNORMAL HIGH (ref 70–99)
Glucose-Capillary: 122 mg/dL — ABNORMAL HIGH (ref 70–99)
Glucose-Capillary: 139 mg/dL — ABNORMAL HIGH (ref 70–99)
Glucose-Capillary: 158 mg/dL — ABNORMAL HIGH (ref 70–99)
Glucose-Capillary: 163 mg/dL — ABNORMAL HIGH (ref 70–99)
Glucose-Capillary: 166 mg/dL — ABNORMAL HIGH (ref 70–99)
Glucose-Capillary: 187 mg/dL — ABNORMAL HIGH (ref 70–99)

## 2020-07-18 LAB — PHOSPHORUS: Phosphorus: 3.2 mg/dL (ref 2.5–4.6)

## 2020-07-18 LAB — MAGNESIUM: Magnesium: 2 mg/dL (ref 1.7–2.4)

## 2020-07-18 MED ORDER — METHOCARBAMOL 500 MG PO TABS: 500.0000 mg | ORAL_TABLET | Freq: Three times a day (TID) | ORAL | Status: DC | PRN

## 2020-07-18 MED ORDER — LIDOCAINE HCL 1 % IJ SOLN
INTRAMUSCULAR | Status: AC
  Filled 2020-07-18: qty 20

## 2020-07-18 MED ORDER — METHOCARBAMOL 500 MG PO TABS
500.0000 mg | ORAL_TABLET | Freq: Three times a day (TID) | ORAL | Status: DC | PRN
  Administered 2020-07-18 – 2020-07-22 (×7): 500 mg via ORAL
  Filled 2020-07-18 (×6): qty 1

## 2020-07-18 MED ORDER — LIDOCAINE HCL 1 % IJ SOLN
INTRAMUSCULAR | Status: DC | PRN
  Administered 2020-07-18: 10 mL via INTRADERMAL

## 2020-07-18 MED ORDER — LORAZEPAM 2 MG/ML IJ SOLN
1.0000 mg | Freq: Once | INTRAMUSCULAR | Status: AC
  Administered 2020-07-18: 1 mg via INTRAVENOUS
  Filled 2020-07-18: qty 1

## 2020-07-18 MED ORDER — METHOCARBAMOL 1000 MG/10ML IJ SOLN
500.0000 mg | Freq: Three times a day (TID) | INTRAVENOUS | Status: DC | PRN
  Administered 2020-07-18: 500 mg via INTRAVENOUS
  Filled 2020-07-18: qty 500

## 2020-07-18 NOTE — Progress Notes (Signed)
PHARMACY CONSULT NOTE FOR:  OUTPATIENT  PARENTERAL ANTIBIOTIC THERAPY (OPAT)  Indication: Septic shoulder/osteomyelitis Regimen: cefepime 2g IV q8h End date: 09/12/2020  IV antibiotic discharge orders are pended. To discharging provider:  please sign these orders via discharge navigator,  Select New Orders & click on the button choice - Manage This Unsigned Work.     Thank you for allowing pharmacy to be a part of this patient's care.  Phillis Haggis 07/18/2020, 9:53 AM

## 2020-07-18 NOTE — Progress Notes (Signed)
Subjective:   Pt feels much better  Antibiotics:  Anti-infectives (From admission, onward)   Start     Dose/Rate Route Frequency Ordered Stop   07/17/20 1600  ceFAZolin (ANCEF) IVPB 2g/100 mL premix        2 g 200 mL/hr over 30 Minutes Intravenous On call to O.R. 07/17/20 1441 07/17/20 1716   07/16/20 0900  ceFEPIme (MAXIPIME) 2 g in sodium chloride 0.9 % 100 mL IVPB        2 g 200 mL/hr over 30 Minutes Intravenous Every 8 hours 07/16/20 0805     07/12/20 1800  penicillin G potassium 12 Million Units in dextrose 5 % 500 mL continuous infusion  Status:  Discontinued        12 Million Units 41.7 mL/hr over 12 Hours Intravenous Every 12 hours 07/12/20 1100 07/16/20 0805   07/11/20 1800  cefTRIAXone (ROCEPHIN) 2 g in sodium chloride 0.9 % 100 mL IVPB  Status:  Discontinued        2 g 200 mL/hr over 30 Minutes Intravenous Every 24 hours 07/11/20 1235 07/12/20 1100   07/10/20 2200  cefTRIAXone (ROCEPHIN) 2 g in sodium chloride 0.9 % 100 mL IVPB  Status:  Discontinued        2 g 200 mL/hr over 30 Minutes Intravenous Every 24 hours 07/09/20 2347 07/10/20 0926   07/10/20 1000  vancomycin (VANCOCIN) IVPB 1000 mg/200 mL premix  Status:  Discontinued        1,000 mg 200 mL/hr over 60 Minutes Intravenous Every 12 hours 07/10/20 0033 07/11/20 1235   07/10/20 1000  ceFEPIme (MAXIPIME) 2 g in sodium chloride 0.9 % 100 mL IVPB  Status:  Discontinued        2 g 200 mL/hr over 30 Minutes Intravenous Every 8 hours 07/10/20 0939 07/11/20 1235   07/09/20 2100  vancomycin (VANCOREADY) IVPB 1750 mg/350 mL        1,750 mg 175 mL/hr over 120 Minutes Intravenous STAT 07/09/20 2045 07/09/20 2333   07/09/20 2045  vancomycin (VANCOCIN) IVPB 1000 mg/200 mL premix  Status:  Discontinued        1,000 mg 200 mL/hr over 60 Minutes Intravenous  Once 07/09/20 2041 07/09/20 2045   07/09/20 2045  cefTRIAXone (ROCEPHIN) 2 g in sodium chloride 0.9 % 100 mL IVPB        2 g 200 mL/hr over 30 Minutes  Intravenous  Once 07/09/20 2041 07/09/20 2209      Medications: Scheduled Meds: . aspirin EC  81 mg Oral Daily  . buPROPion  300 mg Oral Daily  . carvedilol  6.25 mg Oral BID WC  . Chlorhexidine Gluconate Cloth  6 each Topical Daily  . docusate sodium  100 mg Oral BID  . enoxaparin (LOVENOX) injection  40 mg Subcutaneous Q24H  . gabapentin  400 mg Oral Daily  . gabapentin  800 mg Oral QHS  . insulin aspart  0-15 Units Subcutaneous Q4H  . mouth rinse  15 mL Mouth Rinse q12n4p  . mouth rinse  15 mL Mouth Rinse BID  . pantoprazole  40 mg Oral Daily  . rOPINIRole  1 mg Oral TID  . tamsulosin  0.4 mg Oral Daily  . ticagrelor  90 mg Oral BID   Continuous Infusions: . sodium chloride Stopped (07/10/20 0748)  . ceFEPime (MAXIPIME) IV 2 g (07/18/20 0511)   PRN Meds:.acetaminophen, albuterol, bisacodyl, guaiFENesin, lidocaine, magnesium citrate, menthol-cetylpyridinium **OR** phenol, metoCLOPramide **OR** metoCLOPramide (REGLAN)  injection, nitroGLYCERIN, ondansetron **OR** ondansetron (ZOFRAN) IV, oxyCODONE, polyethylene glycol    Objective: Weight change:   Intake/Output Summary (Last 24 hours) at 07/18/2020 1104 Last data filed at 07/18/2020 1000 Gross per 24 hour  Intake 240 ml  Output 1450 ml  Net -1210 ml   Blood pressure (!) 174/112, pulse 87, temperature 98 F (36.7 C), resp. rate 20, height 6' (1.829 m), weight 91 kg, SpO2 91 %. Temp:  [97.5 F (36.4 C)-98.5 F (36.9 C)] 98 F (36.7 C) (12/03 0931) Pulse Rate:  [78-92] 87 (12/03 0931) Resp:  [16-22] 20 (12/03 0931) BP: (113-174)/(49-112) 174/112 (12/03 0931) SpO2:  [90 %-100 %] 91 % (12/03 0931)  Physical Exam: General: Alert and awake, oriented x3 bandage HEENT: anicteric sclera, EOMI CVS regular rate, normal  Chest: , no wheezing, no respiratory distress Abdomen: soft non-distended,  Extremities: Right and left shoulders are bandaged.   Skin: Central line in place. Neuro: nonfocal  CBC:    BMET Recent  Labs    07/17/20 0314 07/18/20 0535  NA 130* 131*  K 4.1 4.3  CL 95* 98  CO2 29 23  GLUCOSE 152* 152*  BUN 14 13  CREATININE 0.84 0.61  CALCIUM 8.2* 8.2*     Liver Panel  Recent Labs    07/17/20 0314 07/18/20 0535  PROT 6.3* 6.0*  ALBUMIN 2.3* 2.2*  AST 18 15  ALT 25 20  ALKPHOS 103 88  BILITOT 0.5 0.6       Sedimentation Rate No results for input(s): ESRSEDRATE in the last 72 hours. C-Reactive Protein No results for input(s): CRP in the last 72 hours.  Micro Results: Recent Results (from the past 720 hour(s))  Anaerobic and Aerobic Culture     Status: None   Collection Time: 06/24/20 11:33 AM   Specimen: Joint, Left Shoulder; Synovial Fluid  Result Value Ref Range Status   MICRO NUMBER: [id number]  Final   SPECIMEN QUALITY: Adequate  Final   Source: FLUID, SYNOVIAL  Final   STATUS: FINAL  Final   GRAM STAIN:   Final    Many Polymorphonuclear leukocytes No organisms seen   ANA RESULT: No anaerobes isolated.  Final   MICRO NUMBER: [id number]  Final   SPECIMEN QUALITY: Adequate  Final   SOURCE: FLUID, SYNOVIAL  Final   STATUS: FINAL  Final   AER RESULT: No Growth  Final  Blood Culture (routine x 2)     Status: None   Collection Time: 07/09/20  9:05 PM   Specimen: BLOOD LEFT FOREARM  Result Value Ref Range Status   Specimen Description   Final    BLOOD LEFT FOREARM Performed at [facility] [address] [place], [place] [address]    Special Requests   Final    BOTTLES DRAWN AEROBIC AND ANAEROBIC Blood Culture results may not be optimal due to an excessive volume of blood received in culture bottles Performed at [facility] [address] [place], [place] [address]    Culture   Final    NO GROWTH 5 DAYS Performed at [facility], [place] [address], [place], [place] [address]    Report Status 07/15/2020 FINAL  Final  Blood Culture (routine x 2)     Status: Abnormal   Collection Time: 07/09/20  9:05 PM    Specimen: BLOOD RIGHT FOREARM  Result Value Ref Range Status   Specimen Description   Final    BLOOD RIGHT FOREARM Performed at [facility], [place] [address], [place], [place] [address]    Special Requests   Final    BOTTLES DRAWN AEROBIC AND ANAEROBIC Blood Culture adequate volume Performed at [facility] [address] [place], [place] [address]    Culture  Setup Time   Final    GRAM POSITIVE COCCI IN CHAINS IN BOTH AEROBIC AND ANAEROBIC BOTTLES CRITICAL RESULT CALLED TO, READ BACK BY AND VERIFIED WITHDomenick Gong PHARMD 0370 07/10/20 A BROWNING Performed at [facility], [place] [address], [place], [place] [address]    Culture GROUP B STREP(S.AGALACTIAE)ISOLATED (A)  Final   Report Status 07/12/2020 FINAL  Final   Organism ID, Bacteria GROUP B STREP(S.AGALACTIAE)ISOLATED  Final      Susceptibility   Group b strep(s.agalactiae)isolated - MIC*    CLINDAMYCIN >=1 RESISTANT Resistant     AMPICILLIN <=0.25 SENSITIVE Sensitive     ERYTHROMYCIN >=8 RESISTANT Resistant     VANCOMYCIN 0.5 SENSITIVE Sensitive     CEFTRIAXONE <=0.12 SENSITIVE Sensitive     LEVOFLOXACIN 0.5 SENSITIVE Sensitive     PENICILLIN Value in next row Sensitive      SENSITIVE0.06    * GROUP B STREP(S.AGALACTIAE)ISOLATED  Blood Culture ID Panel (Reflexed)     Status: Abnormal   Collection Time: 07/09/20  9:05 PM  Result Value Ref Range Status   Enterococcus faecalis NOT DETECTED NOT DETECTED Final   Enterococcus Faecium NOT DETECTED NOT DETECTED Final   Listeria monocytogenes NOT DETECTED NOT DETECTED Final   Staphylococcus species NOT DETECTED NOT DETECTED Final   Staphylococcus aureus (BCID) NOT DETECTED NOT DETECTED Final   Staphylococcus epidermidis NOT DETECTED NOT DETECTED Final   Staphylococcus lugdunensis NOT DETECTED NOT DETECTED Final   Streptococcus species DETECTED (A) NOT DETECTED Final    Comment: CRITICAL RESULT CALLED TO, READ BACK BY AND VERIFIED  WITH: S CHRISTY PHARMD 1631 07/10/20 A BROWNING    Streptococcus agalactiae DETECTED (A) NOT DETECTED Final    Comment: CRITICAL RESULT CALLED TO, READ BACK BY AND VERIFIED WITH: S CHRISTY PHARMD 1631 07/10/20 A BROWNING    Streptococcus pneumoniae NOT DETECTED NOT DETECTED Final   Streptococcus pyogenes NOT DETECTED NOT DETECTED Final   A.calcoaceticus-baumannii NOT DETECTED NOT DETECTED Final   Bacteroides fragilis NOT DETECTED NOT DETECTED Final   Enterobacterales NOT DETECTED NOT DETECTED Final   Enterobacter cloacae complex NOT DETECTED NOT DETECTED Final   Escherichia coli NOT DETECTED NOT DETECTED Final   Klebsiella aerogenes NOT DETECTED NOT DETECTED Final   Klebsiella oxytoca NOT DETECTED NOT DETECTED Final   Klebsiella pneumoniae NOT DETECTED NOT DETECTED Final   Proteus species NOT DETECTED NOT DETECTED Final   Salmonella species NOT DETECTED NOT DETECTED Final   Serratia marcescens NOT DETECTED NOT DETECTED Final   Haemophilus influenzae NOT DETECTED NOT DETECTED Final   Neisseria meningitidis NOT DETECTED NOT DETECTED Final   Pseudomonas aeruginosa NOT DETECTED NOT DETECTED Final   Stenotrophomonas maltophilia NOT DETECTED NOT DETECTED Final   Candida albicans NOT DETECTED NOT DETECTED Final   Candida auris NOT DETECTED NOT DETECTED Final   Candida glabrata NOT DETECTED NOT DETECTED Final   Candida krusei NOT DETECTED NOT DETECTED Final   Candida parapsilosis NOT DETECTED NOT DETECTED Final   Candida tropicalis NOT DETECTED NOT DETECTED Final   Cryptococcus neoformans/gattii NOT DETECTED NOT DETECTED Final    Comment: Performed at [facility], [place] [address], [place], [place] [address]  Resp Panel by RT-PCR (Flu A&B, Covid)  Nasopharyngeal Swab     Status: None   Collection Time: 07/09/20  9:06 PM   Specimen: Nasopharyngeal Swab; Nasopharyngeal(NP) swabs in vial transport medium  Result Value Ref Range Status   SARS Coronavirus 2 by RT PCR NEGATIVE NEGATIVE  Final    Comment: (NOTE) SARS-CoV-2 target nucleic acids are NOT DETECTED.  The SARS-CoV-2 RNA is generally detectable in upper respiratory specimens during the acute phase of infection. The lowest concentration of SARS-CoV-2 viral copies this assay can detect is 138 copies/mL. A negative result does not preclude SARS-Cov-2 infection and should not be used as the sole basis for treatment or other patient management decisions. A negative result may occur with  improper specimen collection/handling, submission of specimen other than nasopharyngeal swab, presence of viral mutation(s) within the areas targeted by this assay, and inadequate number of viral copies(<138 copies/mL). A negative result must be combined with clinical observations, patient history, and epidemiological information. The expected result is Negative.  Fact Sheet for Patients:  EntrepreneurPulse.com.au  Fact Sheet for Healthcare Providers:  IncredibleEmployment.be  This test is no t yet approved or cleared by the [place] FDA and  has been authorized for detection and/or diagnosis of SARS-CoV-2 by FDA under an Emergency Use Authorization (EUA). This EUA will remain  in effect (meaning this test can be used) for the duration of the COVID-19 declaration under Section 564(b)(1) of the Act, 21 U.S.C.section 360bbb-3(b)(1), unless the authorization is terminated  or revoked sooner.       Influenza A by PCR NEGATIVE NEGATIVE Final   Influenza B by PCR NEGATIVE NEGATIVE Final    Comment: (NOTE) The Xpert Xpress SARS-CoV-2/FLU/RSV plus assay is intended as an aid in the diagnosis of influenza from Nasopharyngeal swab specimens and should not be used as a sole basis for treatment. Nasal washings and aspirates are unacceptable for Xpert Xpress SARS-CoV-2/FLU/RSV testing.  Fact Sheet for Patients: EntrepreneurPulse.com.au  Fact Sheet for Healthcare  Providers: IncredibleEmployment.be  This test is not yet approved or cleared by the [place] FDA and has been authorized for detection and/or diagnosis of SARS-CoV-2 by FDA under an Emergency Use Authorization (EUA). This EUA will remain in effect (meaning this test can be used) for the duration of the COVID-19 declaration under Section 564(b)(1) of the Act, 21 U.S.C. section 360bbb-3(b)(1), unless the authorization is terminated or revoked.  Performed at [facility], [place] [address], [place], [place] [address]   Body fluid culture     Status: None   Collection Time: 07/09/20 10:00 PM   Specimen: Synovium; Body Fluid  Result Value Ref Range Status   Specimen Description   Final    SYNOVIAL LEFT SHOULDER Performed at [facility] [address], [place], [place] [address]    Special Requests   Final    NONE Performed at [facility], [place] [address] [place], [place] [address]    Gram Stain   Final    MODERATE WBC PRESENT, PREDOMINANTLY PMN NO ORGANISMS SEEN RESULT CALLED TO, READ BACK BY AND VERIFIED WITH: RN J DAVISON AT 2251 07/09/20 CRUICKSHANK A Performed at [facility], [place] [address], [place],  [address]    Culture   Final    RARE GROUP B STREP(S.AGALACTIAE)ISOLATED TESTING AGAINST S. AGALACTIAE NOT ROUTINELY PERFORMED DUE TO PREDICTABILITY OF AMP/PEN/VAN SUSCEPTIBILITY. CRITICAL RESULT CALLED TO, READ BACK BY AND VERIFIED WITH: A. MANSFIELD RN, AT 3419 07/11/20 BY D. VANHOOK REGARDING GROWTH Performed at [facility], [place] [address], [place], [place] [address]    Report Status 07/12/2020 FINAL  Final  Urine culture     Status: Abnormal   Collection Time: 07/10/20  1:00 AM   Specimen: In/Out Cath Urine  Result Value Ref Range Status   Specimen Description   Final    IN/OUT CATH URINE Performed at [facility] [address]  [place], [place] [address]    Special Requests   Final    NONE Performed at [facility], [place] [address], [place], [place] [address]    Culture (A)  Final    10,000 COLONIES/mL PSEUDOMONAS AERUGINOSA 10,000 COLONIES/mL STREPTOCOCCUS AGALACTIAE TESTING AGAINST S. AGALACTIAE NOT ROUTINELY PERFORMED DUE TO PREDICTABILITY OF AMP/PEN/VAN SUSCEPTIBILITY. Performed at [facility], [place] [address], [place], [place] [address]    Report Status 07/12/2020 FINAL  Final   Organism ID, Bacteria PSEUDOMONAS AERUGINOSA (A)  Final      Susceptibility   Pseudomonas aeruginosa - MIC*    CEFTAZIDIME 2 SENSITIVE Sensitive     CIPROFLOXACIN <=0.25 SENSITIVE Sensitive     GENTAMICIN <=1 SENSITIVE Sensitive     IMIPENEM 2 SENSITIVE Sensitive     PIP/TAZO <=4 SENSITIVE Sensitive     CEFEPIME 2 SENSITIVE Sensitive     * 10,000 COLONIES/mL PSEUDOMONAS AERUGINOSA  MRSA PCR Screening     Status: None   Collection Time: 07/10/20  1:15 AM   Specimen: Nasopharyngeal  Result Value Ref Range Status   MRSA by PCR NEGATIVE NEGATIVE Final    Comment:        The GeneXpert MRSA Assay (FDA approved for NASAL specimens only), is one component of a comprehensive MRSA colonization surveillance program. It is not intended to diagnose MRSA infection nor to guide or monitor treatment for MRSA infections. Performed at [facility], [place] [address], [place], [place] [address]   Culture, blood (routine x 2)     Status: None   Collection Time: 07/11/20  9:55 AM   Specimen: BLOOD  Result Value Ref Range Status   Specimen Description   Final    BLOOD LEFT ANTECUBITAL Performed at [facility] [address] [place], [place] [address]    Special Requests   Final    BOTTLES DRAWN AEROBIC ONLY Blood Culture adequate volume Performed at [facility] [address], [place], [place] [address]    Culture   Final    NO GROWTH 5 DAYS Performed  at [facility], [place] [address], [place], [place] [address]    Report Status 07/16/2020 FINAL  Final  Culture, blood (routine x 2)     Status: None   Collection Time: 07/11/20 10:01 AM   Specimen: BLOOD LEFT HAND  Result Value Ref Range Status   Specimen Description   Final    BLOOD LEFT HAND Performed at [facility] [address] [place], [place] [address]    Special Requests   Final    BOTTLES DRAWN AEROBIC ONLY Blood Culture adequate volume Performed at [facility] [address] [place], [place] [address]    Culture   Final    NO GROWTH 5 DAYS Performed at [facility], [place] [address], [place], [place] [address]    Report Status 07/16/2020 FINAL  Final  Body fluid culture     Status: None   Collection Time: 07/13/20  8:45 AM   Specimen: Joint,  Right Shoulder; Synovial Fluid  Result Value Ref Range Status   Specimen Description   Final    JOINT FLUID Performed at [facility] [address] [place], [place] [address]    Special Requests   Final    RIGHT SHOULDER Performed at [facility] [address] [place], [place] [address]    Gram Stain   Final    RARE WBC PRESENT, PREDOMINANTLY MONONUCLEAR NO ORGANISMS SEEN    Culture   Final    RARE PSEUDOMONAS AERUGINOSA CRITICAL RESULT CALLED TO, READ BACK BY AND VERIFIED WITH: Bernette Mayers RN, AT [phone] 07/17/20 BY D. Victoriano Lain REGARDING CULTURE GROWTH Performed at [facility], [place] [address], [place], [place] [address]    Report Status 07/17/2020 FINAL  Final   Organism ID, Bacteria PSEUDOMONAS AERUGINOSA  Final      Susceptibility   Pseudomonas aeruginosa - MIC*    CEFTAZIDIME 2 SENSITIVE Sensitive     CIPROFLOXACIN <=0.25 SENSITIVE Sensitive     GENTAMICIN <=1 SENSITIVE Sensitive     IMIPENEM 1 SENSITIVE Sensitive     PIP/TAZO <=4 SENSITIVE Sensitive     CEFEPIME 2 SENSITIVE Sensitive     * RARE PSEUDOMONAS AERUGINOSA     Studies/Results: IR Fluoro Guide CV Line Right  Result Date: 07/18/2020 CLINICAL DATA:  Septic arthritis of the shoulders and need for tunneled central venous catheter for long-term IV antibiotic therapy. EXAM: TUNNELED CENTRAL VENOUS CATHETER PLACEMENT WITH ULTRASOUND AND FLUOROSCOPIC GUIDANCE ANESTHESIA/SEDATION: None MEDICATIONS: None FLUOROSCOPY TIME:  54 seconds.  11.0 mGy. PROCEDURE: The procedure, risks, benefits, and alternatives were explained to the patient. Questions regarding the procedure were encouraged and answered. The patient understands and consents to the procedure. A timeout was performed prior to initiating the procedure. The right neck and chest were prepped with chlorhexidine in a sterile fashion, and a sterile drape was applied covering the operative field. Maximum barrier sterile technique with sterile gowns and gloves were used for the procedure. Local anesthesia was provided with 1% lidocaine. Ultrasound was used to confirm patency of the right internal jugular vein. After creating a small venotomy incision, a 21 gauge needle was advanced into the right internal jugular vein under direct, real-time ultrasound guidance. Ultrasound image documentation was performed. After securing guidewire access a 6 French peel-away sheath was placed. A wire was kinked to measure appropriate catheter length. A 6 [place], dual-lumen power line was chosen for placement. This was tunneled in a retrograde fashion from the chest wall to the venotomy incision. The catheter was cut to 24 cm based on guidewire measurement. The catheter was then placed through the peel-away sheath and the sheath removed. Final catheter positioning was confirmed and documented with a fluoroscopic spot image. The catheter was aspirated and flushed with saline. The venotomy incision was closed with subcuticular 4-0 Vicryl. Dermabond was applied to the incision. The catheter exit site was secured with Prolene retention sutures.  COMPLICATIONS: None.  No pneumothorax. FINDINGS: After catheter placement, the tip lies at the SVC/RA junction. The catheter aspirates normally and is ready for immediate use. IMPRESSION: Placement of tunneled central venous catheter via the right internal jugular vein. The catheter tip lies at the SVC/RA junction. The catheter is ready for immediate use. Electronically Signed   By: Aletta Edouard M.D.   On: 07/18/2020 10:17   IR US Guide Vasc Access Right  Result Date: 07/18/2020 CLINICAL DATA:  Septic arthritis of the shoulders and need for tunneled central  venous catheter for long-term IV antibiotic therapy. EXAM: TUNNELED CENTRAL VENOUS CATHETER PLACEMENT WITH ULTRASOUND AND FLUOROSCOPIC GUIDANCE ANESTHESIA/SEDATION: None MEDICATIONS: None FLUOROSCOPY TIME:  54 seconds.  11.0 mGy. PROCEDURE: The procedure, risks, benefits, and alternatives were explained to the patient. Questions regarding the procedure were encouraged and answered. The patient understands and consents to the procedure. A timeout was performed prior to initiating the procedure. The right neck and chest were prepped with chlorhexidine in a sterile fashion, and a sterile drape was applied covering the operative field. Maximum barrier sterile technique with sterile gowns and gloves were used for the procedure. Local anesthesia was provided with 1% lidocaine. Ultrasound was used to confirm patency of the right internal jugular vein. After creating a small venotomy incision, a 21 gauge needle was advanced into the right internal jugular vein under direct, real-time ultrasound guidance. Ultrasound image documentation was performed. After securing guidewire access a 6 French peel-away sheath was placed. A wire was kinked to measure appropriate catheter length. A 6 [place], dual-lumen power line was chosen for placement. This was tunneled in a retrograde fashion from the chest wall to the venotomy incision. The catheter was cut to 24 cm based on  guidewire measurement. The catheter was then placed through the peel-away sheath and the sheath removed. Final catheter positioning was confirmed and documented with a fluoroscopic spot image. The catheter was aspirated and flushed with saline. The venotomy incision was closed with subcuticular 4-0 Vicryl. Dermabond was applied to the incision. The catheter exit site was secured with Prolene retention sutures. COMPLICATIONS: None.  No pneumothorax. FINDINGS: After catheter placement, the tip lies at the SVC/RA junction. The catheter aspirates normally and is ready for immediate use. IMPRESSION: Placement of tunneled central venous catheter via the right internal jugular vein. The catheter tip lies at the SVC/RA junction. The catheter is ready for immediate use. Electronically Signed   By: Aletta Edouard M.D.   On: 07/18/2020 10:17      Assessment/Plan:  INTERVAL   Patient is status post left shoulder percutaneous hematoma evacuation with placement of drain and right shoulder arthroscopic lavage with extensive debridement and placement of drain  Active Problems:   Peripheral artery disease (HCC)   Hx of CABG   Septic shock (HCC)   Shortness of breath   Status post aortobifemoral bypass surgery   History of carotid endarterectomy   Type 2 diabetes mellitus with hyperlipidemia (HCC)   Diabetes mellitus type II, non insulin dependent (HCC)   Acute respiratory failure (HCC)   Pyogenic arthritis of left shoulder region [facility])   Acute pain of right shoulder   Sepsis due to group B Streptococcus ([place])    [name] is a 74 y.o. male with Group B streptococcal bacteremia and septic shock due to left septic shoulder sp I and D.  His right shoulder now turns out to be infected as well with Pseudomonas growing from an aspirate and an MRI showing septic joint along with osteomyelitis involving the humerus and AC joint.  He is now status post surgery to the right side and repeat aspirate of the  left which revealed hematoma.  Switch symptom penicillin to cefepime   We will plan on giving him 8 weeks of postoperative cefepime followed then by oral levofloxacin given the findings of osteomyelitis seen on MRI.   Diagnosis: Bilateral septic shoulders and osteomyelitis and right shoulder  Culture Result: Group B streptococcus and Pseudomonas aeruginosa  Allergies  Allergen Reactions  . Ivp Dye [Iodinated  Diagnostic Agents]     Blood Pressure dropped----pt was pre-medicated with 13 hour prep and did fine with pre-meds--amy 03/09/11   . Sulfa Antibiotics Hives  . Sulfonamide Derivatives Hives and Itching  . Lisinopril Cough  . Metoprolol Nausea And Vomiting  . Statins Other (See Comments)    myalgias  . Propoxyphene N-Acetaminophen Other (See Comments)    Sharp pains- headache    OPAT Orders Discharge antibiotics:  Cefepime   Duration:  8 weeks End Date:  September 12, 2020  Central line care Per Protocol:   Labs  weekly while on IV antibiotics: _x_ CBC with differential _x_ BMP w GFR/CMP _x CRP _x_ ESR    _x_ Please have IR central line at  completion of IV antibiotics   Fax weekly labs to [phone]  Clinic Follow Up Appt:   Rafal Archuleta has an appointment  on 08/21/2020 with Dr [name] at [facility] is located in the [facility] at  [address] in [place].  [address], which is located to the left of the elevators.  Phone: [phone]  Fax: [phone]  [email or web address]    I will sign off for now  Please call with further questions.     LOS: 9 days   Alcide Evener 07/18/2020, 11:04 AM

## 2020-07-18 NOTE — Plan of Care (Signed)
  Problem: Fluid Volume: Goal: Hemodynamic stability will improve Outcome: Progressing   Problem: Clinical Measurements: Goal: Diagnostic test results will improve Outcome: Progressing Goal: Signs and symptoms of infection will decrease Outcome: Progressing   Problem: Respiratory: Goal: Ability to maintain adequate ventilation will improve Outcome: Progressing   Problem: Education: Goal: Knowledge of General Education information will improve Description: Including pain rating scale, medication(s)/side effects and non-pharmacologic comfort measures Outcome: Progressing   Problem: Health Behavior/Discharge Planning: Goal: Ability to manage health-related needs will improve Outcome: Progressing   Problem: Clinical Measurements: Goal: Ability to maintain clinical measurements within normal limits will improve Outcome: Progressing Goal: Will remain free from infection Outcome: Progressing Goal: Diagnostic test results will improve Outcome: Progressing Goal: Respiratory complications will improve Outcome: Progressing Goal: Cardiovascular complication will be avoided Outcome: Progressing   Problem: Activity: Goal: Risk for activity intolerance will decrease Outcome: Progressing   Problem: Nutrition: Goal: Adequate nutrition will be maintained Outcome: Progressing   Problem: Coping: Goal: Level of anxiety will decrease Outcome: Progressing   Problem: Elimination: Goal: Will not experience complications related to bowel motility Outcome: Progressing Goal: Will not experience complications related to urinary retention Outcome: Progressing   Problem: Pain Managment: Goal: General experience of comfort will improve Outcome: Progressing   Problem: Safety: Goal: Ability to remain free from injury will improve Outcome: Progressing   Problem: Skin Integrity: Goal: Risk for impaired skin integrity will decrease Outcome: Progressing   Problem: Education: Goal:  Required Educational Video(s) Outcome: Progressing   Problem: Clinical Measurements: Goal: Postoperative complications will be avoided or minimized Outcome: Progressing   Problem: Skin Integrity: Goal: Demonstration of wound healing without infection will improve Outcome: Progressing

## 2020-07-18 NOTE — Progress Notes (Signed)
Physical Therapy Treatment Patient Details Name: [name] MRN: [id number] DOB: [birth year] Today's Date: 07/18/2020    History of Present Illness 74 year old male presents 07/10/20 from home with generalized weakness, fever, found down by friend. Patient has chronic left shoulder problems requiring fluid drained off, by orthopedics, most recently 1 to 2 weeks ago. S/P arthroscopy and washout of Left shoulder, scheduled I&D R shoulder 12/2. 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  Pt s/p Left shoulder percutaneous hematoma evacuation with placement of drain and Right shoulder arthroscopic lavage and extensive debridement with placement of drain on 07/17/20    PT Comments    Pt reluctant to mobilize due to breakfast arriving however encouraged at least OOB and to recliner to eat (pt premedicated for session).  Pt agreeable and required min-mod assist for transfer.  Pt impulsive and requiring assist for safety. Continue to recommend SNF upon d/c.   Follow Up Recommendations  SNF     Equipment Recommendations  None recommended by PT    Recommendations for Other Services       Precautions / Restrictions Precautions Precautions: Fall Precaution Comments: painful B shoulders, rotator cuff tear/septic osteomylitis R, Left shoulder with dressing. use his shoes. pt has JP drains in each shoulder Restrictions RUE Weight Bearing: Weight bearing as tolerated LUE Weight Bearing: Weight bearing as tolerated Other Position/Activity Restrictions: able to use UEs as pain allows per ortho note    Mobility  Bed Mobility Overal bed mobility: Needs Assistance Bed Mobility: Supine to Sit     Supine to sit: Mod assist     General bed mobility  comments: mod A x2 for trunk support due to limited UE use  Transfers Overall transfer level: Needs assistance Equipment used: 2 person hand held assist Transfers: Sit to/from Stand;Stand Pivot Transfers Sit to Stand: +2 physical assistance;Min assist         General transfer comment: therapist donned pt's shoes at EOB, pt did not follow cues to wait and declined using RW at this time so safely assisted pt to recliner  Ambulation/Gait                 Stairs             Wheelchair Mobility    Modified Rankin (Stroke Patients Only)       Balance Overall balance assessment: Needs assistance         Standing balance support: Bilateral upper extremity supported Standing balance-Leahy Scale: Poor Standing balance comment: reliant on external support                            Cognition Arousal/Alertness: Awake/alert Behavior During Therapy: Impulsive   Area of Impairment: Memory;Safety/judgement;Orientation                 Orientation Level: Time   Memory: Decreased short-term memory   Safety/Judgement: Decreased awareness of safety;Decreased awareness of deficits     General Comments: very impulsive      Exercises      General Comments        Pertinent Vitals/Pain Pain Assessment: Faces Faces Pain Scale: Hurts even more Pain Location: bil shoulders Pain Descriptors / Indicators: Sore;Grimacing Pain Intervention(s): Repositioned;Monitored during session;Premedicated before session  Home Living                      Prior Function            PT Goals (current goals can now be found in the care plan section) Progress towards PT goals: Progressing toward goals    Frequency    Min 2X/week      PT Plan Current plan remains appropriate    Co-evaluation              AM-PAC PT "6 Clicks" Mobility   Outcome Measure  Help needed turning from your back to your side while in a flat bed without using  bedrails?: A Lot Help needed moving from lying on your back to sitting on the side of a flat bed without using bedrails?: A Lot Help needed moving to and from a bed to a chair (including a wheelchair)?: A Lot Help needed standing up from a chair using your arms (e.g., wheelchair or bedside chair)?: A Lot Help needed to walk in hospital room?: A Lot Help needed climbing 3-5 steps with a railing? : Total 6 Click Score: 11    End of Session Equipment Utilized During Treatment: Gait belt Activity Tolerance: Patient tolerated treatment well;Patient limited by fatigue Patient left: in chair;with call bell/phone within reach;with chair alarm set Nurse Communication: Mobility status PT Visit Diagnosis: Unsteadiness on feet (R26.81);Difficulty in walking, not elsewhere classified (R26.2)     Time: 2233-6122 PT Time Calculation (min) (ACUTE ONLY): 9 min  Charges:  $Therapeutic Activity: 8-22 mins                    Jannette Spanner PT, DPT Acute Rehabilitation Services Pager: [phone] Office: [phone]  York Ram E 07/18/2020, 1:13 PM

## 2020-07-18 NOTE — Procedures (Signed)
Interventional Radiology Procedure Note  Procedure: Right IJ tunneled CVC placement  Complications: None  Estimated Blood Loss: < 10 mL  Findings: Right IJ, 6 Fr DL tunneled Power Line cut to 24 cm placed with catheter tip at SVC/RA junction. OK to use.  Venetia Night. Kathlene Cote, M.D Pager:  [phone]

## 2020-07-18 NOTE — Progress Notes (Signed)
Left JP drain fell out while patient was ambulating from the bathroom back to the bed. Site is covered with ABD pad from surgery. No drainage noted on ABD pad. Jenetta Loges, PA made aware. Will continue to monitor.

## 2020-07-18 NOTE — TOC Progression Note (Signed)
Transition of Care [facility]) - Progression Note    Patient Details  Name: [name] MRN: [id number] Date of Birth: 1946-02-09  Transition of [facility] CM/SW Contact  Ludwika Rodd, Juliann Pulse, RN Phone Number: 07/18/2020, 3:37 PM  Clinical Narrative: Patient states again I am not going to rehab. Patient adamantly decline SNF. Has JP drain, [facility]-for long term iv abx. Will explore home infusion-[facility], & [facility] HHRN-for teaching. Patient has [place] speaking care giver [name].      Expected Discharge Plan: [place] Barriers to Discharge: Continued Medical Work up  Expected Discharge Plan and Services Expected Discharge Plan: [place]   Discharge Planning Services: CM Consult   Living arrangements for the past 2 months: Single Family Home                           HH Arranged: Refused SNF           Social Determinants of Health (SDOH) Interventions    Readmission Risk Interventions No flowsheet data found.

## 2020-07-18 NOTE — Progress Notes (Signed)
[name]  MRN: [id number] DOB/Age: 10/27/1945 74 y.o. Physician: [name], M.D. 1 Day Post-Op Procedure(s) (LRB): Right shoulder arthroscopic lavage; Left shoulder aspiration. (Bilateral)  Subjective: Patient significantly improved overnight, has been transferred to regular medical floor.  Sitting in bedside chair and preparing to ambulate to the bathroom.  Overall states shoulder is feeling significantly better. Vital Signs Temp:  [97.5 F (36.4 C)-98.5 F (36.9 C)] 97.7 F (36.5 C) (12/03 1231) Pulse Rate:  [72-92] 72 (12/03 1234) Resp:  [16-22] 18 (12/03 1231) BP: (95-174)/(49-112) 108/60 (12/03 1234) SpO2:  [90 %-100 %] 100 % (12/03 1231)  Lab Results Recent Labs    07/17/20 0314 07/18/20 0535  WBC 11.3* 10.2  HGB 8.6* 8.8*  HCT 27.1* 27.9*  PLT 376 400   BMET Recent Labs    07/17/20 0314 07/18/20 0535  NA 130* 131*  K 4.1 4.3  CL 95* 98  CO2 29 23  GLUCOSE 152* 152*  BUN 14 13  CREATININE 0.84 0.61  CALCIUM 8.2* 8.2*   INR  Date Value Ref Range Status  07/09/2020 1.3 (H) 0.8 - 1.2 Final    Comment:    (NOTE) INR goal varies based on device and disease states. Performed at [facility], [place] [address] [place], [place] [address]      Exam  Bilateral shoulders demonstrate dressings to be intact.  Both drains appear patent.  Total of 40 cc out of the both drains combined, 30 of the left, 10 of the right.  Plan Patient appears clinically much improved after arthroscopic lavage of right shoulder and percutaneous lavage and debridement with drain placement of the left shoulder yesterday.  Ideally we would like to maintain drains for an additional 24 to 48 hours until output is less than 10 cc per shift.  We will continue to monitor his clinical status.  Will require drains to be pulled before discharge home.  IV antibiotic therapy has been prescribed by infectious disease with follow-up in their office arranged as well.  Patient  may utilize both upper extremities within pain tolerance.  Position arms for comfort.  Encourage gentle motion of elbow wrist and hand and may use the upper extremities for activities of daily living and transfers as symptoms allow.   [name] M [name] 07/18/2020, 12:42 PM   Contact # [phone]

## 2020-07-18 NOTE — Progress Notes (Signed)
Pt's BP 100/47, HR 60's. Coreg 6.25 mg due at this time. Dr. Lupita Leash made aware and new orders to hold Coreg at this time. Will continue to monitor.

## 2020-07-18 NOTE — Care Management Important Message (Signed)
Important Message  Patient Details IM Letter given to the Patient Name: [name] MRN: [id number] Date of Birth: 1945-12-14   Medicare Important Message Given:  Yes     Kerin Salen 07/18/2020, 1:38 PM

## 2020-07-18 NOTE — Plan of Care (Signed)
  Problem: Education: Goal: Knowledge of General Education information will improve Description: Including pain rating scale, medication(s)/side effects and non-pharmacologic comfort measures Outcome: Progressing   Problem: Clinical Measurements: Goal: Will remain free from infection Outcome: Progressing   Problem: Pain Managment: Goal: General experience of comfort will improve Outcome: Progressing   

## 2020-07-18 NOTE — Progress Notes (Signed)
PROGRESS NOTE    [name]  ZOX:[id number] DOB: 1945/09/30 DOA: 07/09/2020 PCP: Doreatha Lew, MD   Chief Complaint  Patient presents with  . Arm Pain  . Hypotension  Brief Narrative: 74 year old male with complex medical history with diabetes, PAD, CAD with CABG presented with weakness, fever and septic shock secondary to group B streptococcal bacteremia secondary to septic left shoulder arthritis, s/p arthroscopic I &D and washout on 07/10/20. Left septic shoulder aspirate grew group D strep on right shoulder culture grew Pseudomonas. Followed by ID and orthopedics. 12/2-significant agitation and attempting to get out of bed. 12/2-left shoulder percutaneous hematoma evacuation with placement of drain, right shoulder arthroscopic lavage and extensive debridement with placement of drain by Dr Onnie Graham 12/3- PICC.  Subjective: Patient is alert awake oriented at baseline, on supplemental nasal cannula normally not on home oxygen His caregiver who stays with him 24x7 at home is at bedside S/p PICC line today Foley in place normally does not have Foley at home Afebrile overnight.  Leukocytosis resolved.  Assessment & Plan:  Septic shock due to septic arthritis of left and right shoulder: s/p multiple shoulder aspirations-arthroscopic I &D and washout on 07/10/20-Left septic shoulder aspirate grew group D strep, On right shoulder culture grew Pseudomonas.Followed by ID and orthopedics.12/2-left shoulder percutaneous hematoma evacuation with placement of drain, right shoulder arthroscopic lavage and extensive debridement with placement of drain by Dr Onnie Graham. 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  Given bony change on MRI will need protracted IV and oral antibiotics.  PICC line placement.  On cefepime, appreciate ID input.  Elevated troponin suspect  demand ischemia from septic shock/CAD/CABG: Troponin peaked and trended down.  Seen by cardiology EKG was done without ischemic finding.  Recommendation to continue on aspirin, Brilinta  PAD,Status post aortobifemoral bypass surgery: Continue antiplatelets as above. History of carotid endarterectomy 2012: Continue antiplatelets as above. HLD continue PCsk9 inhibitor  BPH: Has indwelling Foley catheter-we will discontinue and do voiding trial, continue Flomax.  Acute metabolic encephalopathy/delirium/sundowning in the setting of multiple factors with sepsis.  Continue to reorient, provide supportive care, fall precaution.  Melatonin 6 mg nightly.  Anemia of chronic disease: S/p 1 unit PRBC 11/27, monitor H&H transfuse if less than 7 g. HB 8-9GM  COPD not in exacerbation continue bronchodilators and as needed supplemental oxygen  Hyponatremia continue oral intake  Type 2 diabetes mellitus with hyperlipidemia:hbA1c 7.2 9/27 continue sliding scale insulin every 4 hours. Recent Labs  Lab 07/17/20 1923 07/17/20 2016 07/18/20 0000 07/18/20 0426 07/18/20 0745  GLUCAP 143* 154* 158* 139* 122*   Nutrition: Diet Order            Diet Heart Room service appropriate? Yes; Fluid consistency: Thin  Diet effective now                Body mass index is 27.21 kg/m. DVT prophylaxis: enoxaparin (LOVENOX) injection 40 mg Start: 07/14/20 2200 Place and maintain sequential compression device Start: 07/10/20 1229 Code Status:   Code Status: Full Code Family Communication: plan of care discussed with patient at bedside.  Status is: Inpatient Remains inpatient appropriate because:IV treatments appropriate due to intensity of illness or inability to take PO and Inpatient level of care appropriate due to severity of illness  Dispo: The patient is from: Home              Anticipated d/c is to: SNF  Anticipated d/c date is: 2 days              Patient currently is not medically stable  to d/c.  Consultants:see note  Procedures:see note  Culture/Microbiology    Component Value Date/Time   SDES  07/13/2020 0845    JOINT FLUID Performed at [facility], [place] [address], [place], [place] [address]    SPECREQUEST  07/13/2020 0845    RIGHT SHOULDER Performed at [facility], [place] [address] [place], [place] [address]    CULT  07/13/2020 0845    RARE PSEUDOMONAS AERUGINOSA CRITICAL RESULT CALLED TO, READ BACK BY AND VERIFIED WITH: Bernette Mayers RN, AT [phone] 07/17/20 BY D. Victoriano Lain REGARDING CULTURE GROWTH Performed at [facility], [place] [address], [place], [place] [address]    REPTSTATUS 07/17/2020 FINAL 07/13/2020 0845    Other culture-see note  Medications: Scheduled Meds: . aspirin EC  81 mg Oral Daily  . buPROPion  300 mg Oral Daily  . carvedilol  6.25 mg Oral BID WC  . Chlorhexidine Gluconate Cloth  6 each Topical Daily  . docusate sodium  100 mg Oral BID  . enoxaparin (LOVENOX) injection  40 mg Subcutaneous Q24H  . gabapentin  400 mg Oral Daily  . gabapentin  800 mg Oral QHS  . insulin aspart  0-15 Units Subcutaneous Q4H  . mouth rinse  15 mL Mouth Rinse q12n4p  . mouth rinse  15 mL Mouth Rinse BID  . pantoprazole  40 mg Oral Daily  . rOPINIRole  1 mg Oral TID  . tamsulosin  0.4 mg Oral Daily  . ticagrelor  90 mg Oral BID   Continuous Infusions: . sodium chloride Stopped (07/10/20 0748)  . ceFEPime (MAXIPIME) IV 2 g (07/18/20 0511)    Antimicrobials: Anti-infectives (From admission, onward)   Start     Dose/Rate Route Frequency Ordered Stop   07/17/20 1600  ceFAZolin (ANCEF) IVPB 2g/100 mL premix        2 g 200 mL/hr over 30 Minutes Intravenous On call to O.R. 07/17/20 1441 07/17/20 1716   07/16/20 0900  ceFEPIme (MAXIPIME) 2 g in sodium chloride 0.9 % 100 mL IVPB        2 g 200 mL/hr over 30 Minutes Intravenous Every 8 hours 07/16/20 0805     07/12/20 1800  penicillin G potassium 12 Million Units in  dextrose 5 % 500 mL continuous infusion  Status:  Discontinued        12 Million Units 41.7 mL/hr over 12 Hours Intravenous Every 12 hours 07/12/20 1100 07/16/20 0805   07/11/20 1800  cefTRIAXone (ROCEPHIN) 2 g in sodium chloride 0.9 % 100 mL IVPB  Status:  Discontinued        2 g 200 mL/hr over 30 Minutes Intravenous Every 24 hours 07/11/20 1235 07/12/20 1100   07/10/20 2200  cefTRIAXone (ROCEPHIN) 2 g in sodium chloride 0.9 % 100 mL IVPB  Status:  Discontinued        2 g 200 mL/hr over 30 Minutes Intravenous Every 24 hours 07/09/20 2347 07/10/20 0926   07/10/20 1000  vancomycin (VANCOCIN) IVPB 1000 mg/200 mL premix  Status:  Discontinued        1,000 mg 200 mL/hr over 60 Minutes Intravenous Every 12 hours 07/10/20 0033 07/11/20 1235   07/10/20 1000  ceFEPIme (MAXIPIME) 2 g in sodium chloride 0.9 % 100 mL IVPB  Status:  Discontinued        2  g 200 mL/hr over 30 Minutes Intravenous Every 8 hours 07/10/20 0939 07/11/20 1235   07/09/20 2100  vancomycin (VANCOREADY) IVPB 1750 mg/350 mL        1,750 mg 175 mL/hr over 120 Minutes Intravenous STAT 07/09/20 2045 07/09/20 2333   07/09/20 2045  vancomycin (VANCOCIN) IVPB 1000 mg/200 mL premix  Status:  Discontinued        1,000 mg 200 mL/hr over 60 Minutes Intravenous  Once 07/09/20 2041 07/09/20 2045   07/09/20 2045  cefTRIAXone (ROCEPHIN) 2 g in sodium chloride 0.9 % 100 mL IVPB        2 g 200 mL/hr over 30 Minutes Intravenous  Once 07/09/20 2041 07/09/20 2209     Objective: Vitals: Today's Vitals   07/18/20 0004 07/18/20 0428 07/18/20 0931 07/18/20 1000  BP: (!) 123/50 (!) 121/51 (!) 174/112   Pulse: 78 80 87   Resp: 19 18 20    Temp: 98.4 F (36.9 C) 98 F (36.7 C) 98 F (36.7 C)   TempSrc:  Oral    SpO2: 91% 100% 91%   Weight:      Height:      PainSc:    5     Intake/Output Summary (Last 24 hours) at 07/18/2020 1047 Last data filed at 07/18/2020 1000 Gross per 24 hour  Intake 240 ml  Output 1450 ml  Net -1210 ml   Filed  Weights   07/09/20 2017 07/10/20 0130 07/12/20 0515  Weight: 86.2 kg 92.5 kg 91 kg   Weight change:   Intake/Output from previous day: 12/02 0701 - 12/03 0700 In: 0  Out: 1410 [Urine:1175; Drains:210; Blood:25] Intake/Output this shift: Total I/O In: 240 [P.O.:240] Out: 40 [Drains:40]  Examination: General exam: AAOx3 ,NAD, weak appearing. HEENT:Oral mucosa moist, Ear/Nose WNL grossly,dentition normal. Respiratory system: bilaterally clear,no wheezing or crackles,no use of accessory muscle, non tender. Cardiovascular system: S1 & S2 +, regular, No JVD. Gastrointestinal system: Abdomen soft, NT,ND, BS+. Nervous System:Alert, awake, moving extremities and grossly nonfocal Extremities: No edema, distal peripheral pulses palpable.  Skin: No rashes,no icterus. MSK: Normal muscle bulk,tone, power.  Bilateral shoulder joint with dressing intact drain in place. Foley in place. Tunneled PICC line on right chest.  Data Reviewed: I have personally reviewed following labs and imaging studies CBC: Recent Labs  Lab 07/12/20 0422 07/12/20 0422 07/12/20 1704 07/13/20 0603 07/14/20 0420 07/17/20 0314 07/18/20 0535  WBC 9.2  --   --  11.5* 9.4 11.3* 10.2  NEUTROABS  --   --   --   --   --   --  8.9*  HGB 7.9*   < > 9.2* 10.2* 9.8* 8.6* 8.8*  HCT 23.2*   < > 27.9* 30.8* 29.1* 27.1* 27.9*  MCV 86.2  --   --  87.5 86.6 92.5 93.6  PLT 320  --   --  413* 403* 376 400   < > = values in this interval not displayed.   Basic Metabolic Panel: Recent Labs  Lab 07/12/20 0422 07/12/20 0422 07/12/20 1704 07/13/20 0603 07/14/20 0420 07/16/20 1955 07/17/20 0314 07/18/20 0535  NA 131*   < > 130* 131* 134*  --  130* 131*  K 3.7   < > 3.8 3.6 3.6  --  4.1 4.3  CL 98   < > 97* 97* 96*  --  95* 98  CO2 24   < > 22 23 28   --  29 23  GLUCOSE 131*   < > 181* 175*  165*  --  152* 152*  BUN 17   < > 22 23 22   --  14 13  CREATININE 0.58*   < > 0.78 0.73 0.66  --  0.84 0.61  CALCIUM 7.9*   < > 8.1*  8.3* 8.6*  --  8.2* 8.2*  MG 1.9  --   --   --   --  2.0  --  2.0  PHOS  --   --   --   --   --  3.2  --  3.2   < > = values in this interval not displayed.   GFR: Estimated Creatinine Clearance: 88.9 mL/min (by C-G formula based on SCr of 0.61 mg/dL). Liver Function Tests: Recent Labs  Lab 07/14/20 0420 07/17/20 0314 07/18/20 0535  AST 67* 18 15  ALT 34 25 20  ALKPHOS  --  103 88  BILITOT  --  0.5 0.6  PROT  --  6.3* 6.0*  ALBUMIN  --  2.3* 2.2*   No results for input(s): LIPASE, AMYLASE in the last 168 hours. No results for input(s): AMMONIA in the last 168 hours. Coagulation Profile: No results for input(s): INR, PROTIME in the last 168 hours. Cardiac Enzymes: No results for input(s): CKTOTAL, CKMB, CKMBINDEX, TROPONINI in the last 168 hours. BNP (last 3 results) No results for input(s): PROBNP in the last 8760 hours. HbA1C: No results for input(s): HGBA1C in the last 72 hours. CBG: Recent Labs  Lab 07/17/20 1923 07/17/20 2016 07/18/20 0000 07/18/20 0426 07/18/20 0745  GLUCAP 143* 154* 158* 139* 122*   Lipid Profile: No results for input(s): CHOL, HDL, LDLCALC, TRIG, CHOLHDL, LDLDIRECT in the last 72 hours. Thyroid Function Tests: No results for input(s): TSH, T4TOTAL, FREET4, T3FREE, THYROIDAB in the last 72 hours. Anemia Panel: No results for input(s): VITAMINB12, FOLATE, FERRITIN, TIBC, IRON, RETICCTPCT in the last 72 hours. Sepsis Labs: No results for input(s): PROCALCITON, LATICACIDVEN in the last 168 hours.  Recent Results (from the past 240 hour(s))  Blood Culture (routine x 2)     Status: None   Collection Time: 07/09/20  9:05 PM   Specimen: BLOOD LEFT FOREARM  Result Value Ref Range Status   Specimen Description   Final    BLOOD LEFT FOREARM Performed at [place] [address], [place], [place] [address]    Special Requests   Final    BOTTLES DRAWN AEROBIC AND ANAEROBIC Blood Culture results may not be optimal due to an  excessive volume of blood received in culture bottles Performed at [place] [address] [place], [place] [address]    Culture   Final    NO GROWTH 5 DAYS Performed at [facility], [place] [address], [place], [place] [address]    Report Status 07/15/2020 FINAL  Final  Blood Culture (routine x 2)     Status: Abnormal   Collection Time: 07/09/20  9:05 PM   Specimen: BLOOD RIGHT FOREARM  Result Value Ref Range Status   Specimen Description   Final    BLOOD RIGHT FOREARM Performed at [place] [address], [place], [place] [address]    Special Requests   Final    BOTTLES DRAWN AEROBIC AND ANAEROBIC Blood Culture adequate volume Performed at [place] [address] [place], [place] [address]    Culture  Setup Time   Final    GRAM POSITIVE COCCI IN CHAINS IN BOTH AEROBIC AND  ANAEROBIC BOTTLES CRITICAL RESULT CALLED TO, READ BACK BY AND VERIFIED WITHDomenick Gong PHARMD [phone] 07/10/20 A BROWNING Performed at [facility], [place] [address], [place], [place] [address]    Culture GROUP B STREP(S.AGALACTIAE)ISOLATED (A)  Final   Report Status 07/12/2020 FINAL  Final   Organism ID, Bacteria GROUP B STREP(S.AGALACTIAE)ISOLATED  Final      Susceptibility   Group b strep(s.agalactiae)isolated - MIC*    CLINDAMYCIN >=1 RESISTANT Resistant     AMPICILLIN <=0.25 SENSITIVE Sensitive     ERYTHROMYCIN >=8 RESISTANT Resistant     VANCOMYCIN 0.5 SENSITIVE Sensitive     CEFTRIAXONE <=0.12 SENSITIVE Sensitive     LEVOFLOXACIN 0.5 SENSITIVE Sensitive     PENICILLIN Value in next row Sensitive      SENSITIVE0.06    * GROUP B STREP(S.AGALACTIAE)ISOLATED  Blood Culture ID Panel (Reflexed)     Status: Abnormal   Collection Time: 07/09/20  9:05 PM  Result Value Ref Range Status   Enterococcus faecalis NOT DETECTED NOT DETECTED Final   Enterococcus Faecium NOT DETECTED NOT DETECTED Final   Listeria monocytogenes NOT  DETECTED NOT DETECTED Final   Staphylococcus species NOT DETECTED NOT DETECTED Final   Staphylococcus aureus (BCID) NOT DETECTED NOT DETECTED Final   Staphylococcus epidermidis NOT DETECTED NOT DETECTED Final   Staphylococcus lugdunensis NOT DETECTED NOT DETECTED Final   Streptococcus species DETECTED (A) NOT DETECTED Final    Comment: CRITICAL RESULT CALLED TO, READ BACK BY AND VERIFIED WITH: S CHRISTY PHARMD 1631 07/10/20 A BROWNING    Streptococcus agalactiae DETECTED (A) NOT DETECTED Final    Comment: CRITICAL RESULT CALLED TO, READ BACK BY AND VERIFIED WITH: S CHRISTY PHARMD 1631 07/10/20 A BROWNING    Streptococcus pneumoniae NOT DETECTED NOT DETECTED Final   Streptococcus pyogenes NOT DETECTED NOT DETECTED Final   A.calcoaceticus-baumannii NOT DETECTED NOT DETECTED Final   Bacteroides fragilis NOT DETECTED NOT DETECTED Final   Enterobacterales NOT DETECTED NOT DETECTED Final   Enterobacter cloacae complex NOT DETECTED NOT DETECTED Final   Escherichia coli NOT DETECTED NOT DETECTED Final   Klebsiella aerogenes NOT DETECTED NOT DETECTED Final   Klebsiella oxytoca NOT DETECTED NOT DETECTED Final   Klebsiella pneumoniae NOT DETECTED NOT DETECTED Final   Proteus species NOT DETECTED NOT DETECTED Final   Salmonella species NOT DETECTED NOT DETECTED Final   Serratia marcescens NOT DETECTED NOT DETECTED Final   Haemophilus influenzae NOT DETECTED NOT DETECTED Final   Neisseria meningitidis NOT DETECTED NOT DETECTED Final   Pseudomonas aeruginosa NOT DETECTED NOT DETECTED Final   Stenotrophomonas maltophilia NOT DETECTED NOT DETECTED Final   Candida albicans NOT DETECTED NOT DETECTED Final   Candida auris NOT DETECTED NOT DETECTED Final   Candida glabrata NOT DETECTED NOT DETECTED Final   Candida krusei NOT DETECTED NOT DETECTED Final   Candida parapsilosis NOT DETECTED NOT DETECTED Final   Candida tropicalis NOT DETECTED NOT DETECTED Final   Cryptococcus neoformans/gattii NOT  DETECTED NOT DETECTED Final    Comment: Performed at [facility], [place] [address], [place], [place] [address]  Resp Panel by RT-PCR (Flu A&B, Covid) Nasopharyngeal Swab     Status: None   Collection Time: 07/09/20  9:06 PM   Specimen: Nasopharyngeal Swab; Nasopharyngeal(NP) swabs in vial transport medium  Result Value Ref Range Status   SARS Coronavirus 2 by RT PCR NEGATIVE NEGATIVE Final    Comment: (NOTE) SARS-CoV-2 target nucleic acids are NOT DETECTED.  The SARS-CoV-2 RNA is generally detectable  in upper respiratory specimens during the acute phase of infection. The lowest concentration of SARS-CoV-2 viral copies this assay can detect is 138 copies/mL. A negative result does not preclude SARS-Cov-2 infection and should not be used as the sole basis for treatment or other patient management decisions. A negative result may occur with  improper specimen collection/handling, submission of specimen other than nasopharyngeal swab, presence of viral mutation(s) within the areas targeted by this assay, and inadequate number of viral copies(<138 copies/mL). A negative result must be combined with clinical observations, patient history, and epidemiological information. The expected result is Negative.  Fact Sheet for Patients:  EntrepreneurPulse.com.au  Fact Sheet for Healthcare Providers:  IncredibleEmployment.be  This test is no t yet approved or cleared by the [place] FDA and  has been authorized for detection and/or diagnosis of SARS-CoV-2 by FDA under an Emergency Use Authorization (EUA). This EUA will remain  in effect (meaning this test can be used) for the duration of the COVID-19 declaration under Section 564(b)(1) of the Act, 21 U.S.C.section 360bbb-3(b)(1), unless the authorization is terminated  or revoked sooner.       Influenza A by PCR NEGATIVE NEGATIVE Final   Influenza B by PCR NEGATIVE NEGATIVE Final    Comment:  (NOTE) The Xpert Xpress SARS-CoV-2/FLU/RSV plus assay is intended as an aid in the diagnosis of influenza from Nasopharyngeal swab specimens and should not be used as a sole basis for treatment. Nasal washings and aspirates are unacceptable for Xpert Xpress SARS-CoV-2/FLU/RSV testing.  Fact Sheet for Patients: EntrepreneurPulse.com.au  Fact Sheet for Healthcare Providers: IncredibleEmployment.be  This test is not yet approved or cleared by the [place] FDA and has been authorized for detection and/or diagnosis of SARS-CoV-2 by FDA under an Emergency Use Authorization (EUA). This EUA will remain in effect (meaning this test can be used) for the duration of the COVID-19 declaration under Section 564(b)(1) of the Act, 21 U.S.C. section 360bbb-3(b)(1), unless the authorization is terminated or revoked.  Performed at [facility], [place] [address], [place], [place] [address]   Body fluid culture     Status: None   Collection Time: 07/09/20 10:00 PM   Specimen: Synovium; Body Fluid  Result Value Ref Range Status   Specimen Description   Final    SYNOVIAL LEFT SHOULDER Performed at [facility] [address] [place], [place] [address]    Special Requests   Final    NONE Performed at [facility], [place] [address] [place], [place] [address]    Gram Stain   Final    MODERATE WBC PRESENT, PREDOMINANTLY PMN NO ORGANISMS SEEN RESULT CALLED TO, READ BACK BY AND VERIFIED WITH: RN J DAVISON AT [phone] 07/09/20 CRUICKSHANK A Performed at [facility], [place] [address], [place], [place] [address]    Culture   Final    RARE GROUP B STREP(S.AGALACTIAE)ISOLATED TESTING AGAINST S. AGALACTIAE NOT ROUTINELY PERFORMED DUE TO PREDICTABILITY OF AMP/PEN/VAN SUSCEPTIBILITY. CRITICAL RESULT CALLED TO, READ BACK BY AND VERIFIED WITH: A. MANSFIELD RN, AT [phone] 07/11/20 BY D. VANHOOK REGARDING  GROWTH Performed at [facility], [place] [address], [place], [place] [address]    Report Status 07/12/2020 FINAL  Final  Urine culture     Status: Abnormal   Collection Time: 07/10/20  1:00 AM   Specimen: In/Out Cath Urine  Result Value Ref Range Status   Specimen Description   Final    IN/OUT CATH URINE Performed at [facility]  [place] [address] [place], [place] [address]    Special Requests   Final    NONE Performed at [facility], [place] [address] [place], [place] [address]    Culture (A)  Final    10,000 COLONIES/mL PSEUDOMONAS AERUGINOSA 10,000 COLONIES/mL STREPTOCOCCUS AGALACTIAE TESTING AGAINST S. AGALACTIAE NOT ROUTINELY PERFORMED DUE TO PREDICTABILITY OF AMP/PEN/VAN SUSCEPTIBILITY. Performed at [facility], [place] [address], [place], [place] [address]    Report Status 07/12/2020 FINAL  Final   Organism ID, Bacteria PSEUDOMONAS AERUGINOSA (A)  Final      Susceptibility   Pseudomonas aeruginosa - MIC*    CEFTAZIDIME 2 SENSITIVE Sensitive     CIPROFLOXACIN <=0.25 SENSITIVE Sensitive     GENTAMICIN <=1 SENSITIVE Sensitive     IMIPENEM 2 SENSITIVE Sensitive     PIP/TAZO <=4 SENSITIVE Sensitive     CEFEPIME 2 SENSITIVE Sensitive     * 10,000 COLONIES/mL PSEUDOMONAS AERUGINOSA  MRSA PCR Screening     Status: None   Collection Time: 07/10/20  1:15 AM   Specimen: Nasopharyngeal  Result Value Ref Range Status   MRSA by PCR NEGATIVE NEGATIVE Final    Comment:        The GeneXpert MRSA Assay (FDA approved for NASAL specimens only), is one component of a comprehensive MRSA colonization surveillance program. It is not intended to diagnose MRSA infection nor to guide or monitor treatment for MRSA infections. Performed at [facility], [place] [address], [place], [place] [address]   Culture, blood (routine x 2)     Status: None   Collection Time: 07/11/20  9:55 AM   Specimen: BLOOD  Result Value Ref Range  Status   Specimen Description   Final    BLOOD LEFT ANTECUBITAL Performed at [facility] [address] [place], [place] [address]    Special Requests   Final    BOTTLES DRAWN AEROBIC ONLY Blood Culture adequate volume Performed at [place] [address] [place], [place] [address]    Culture   Final    NO GROWTH 5 DAYS Performed at [facility], [place] [address] [place], [place] [address]    Report Status 07/16/2020 FINAL  Final  Culture, blood (routine x 2)     Status: None   Collection Time: 07/11/20 10:01 AM   Specimen: BLOOD LEFT HAND  Result Value Ref Range Status   Specimen Description   Final    BLOOD LEFT HAND Performed at [facility] [address] [place], [place] [address]    Special Requests   Final    BOTTLES DRAWN AEROBIC ONLY Blood Culture adequate volume Performed at [place] [address] [place], [place] [address]    Culture   Final    NO GROWTH 5 DAYS Performed at [facility], [place] [address] [place], [place] [address]    Report Status 07/16/2020 FINAL  Final  Body fluid culture     Status: None   Collection Time: 07/13/20  8:45 AM   Specimen: Joint, Right Shoulder; Synovial Fluid  Result Value Ref Range Status   Specimen Description   Final    JOINT FLUID Performed at [facility], [place] [address], [place], [place] [address]    Special Requests   Final    RIGHT SHOULDER Performed at [facility] [address] [place],  [address]  Gram Stain   Final    RARE WBC PRESENT, PREDOMINANTLY MONONUCLEAR NO ORGANISMS SEEN    Culture   Final    RARE PSEUDOMONAS AERUGINOSA CRITICAL RESULT CALLED TO, READ BACK BY AND VERIFIED WITH: Bernette Mayers RN, AT [phone] 07/17/20 BY D. Victoriano Lain REGARDING CULTURE GROWTH Performed at [facility], [place] [address], [place], [place] [address]    Report Status 07/17/2020  FINAL  Final   Organism ID, Bacteria PSEUDOMONAS AERUGINOSA  Final      Susceptibility   Pseudomonas aeruginosa - MIC*    CEFTAZIDIME 2 SENSITIVE Sensitive     CIPROFLOXACIN <=0.25 SENSITIVE Sensitive     GENTAMICIN <=1 SENSITIVE Sensitive     IMIPENEM 1 SENSITIVE Sensitive     PIP/TAZO <=4 SENSITIVE Sensitive     CEFEPIME 2 SENSITIVE Sensitive     * RARE PSEUDOMONAS AERUGINOSA     Radiology Studies: IR Fluoro Guide CV Line Right  Result Date: 07/18/2020 CLINICAL DATA:  Septic arthritis of the shoulders and need for tunneled central venous catheter for long-term IV antibiotic therapy. EXAM: TUNNELED CENTRAL VENOUS CATHETER PLACEMENT WITH ULTRASOUND AND FLUOROSCOPIC GUIDANCE ANESTHESIA/SEDATION: None MEDICATIONS: None FLUOROSCOPY TIME:  54 seconds.  11.0 mGy. PROCEDURE: The procedure, risks, benefits, and alternatives were explained to the patient. Questions regarding the procedure were encouraged and answered. The patient understands and consents to the procedure. A timeout was performed prior to initiating the procedure. The right neck and chest were prepped with chlorhexidine in a sterile fashion, and a sterile drape was applied covering the operative field. Maximum barrier sterile technique with sterile gowns and gloves were used for the procedure. Local anesthesia was provided with 1% lidocaine. Ultrasound was used to confirm patency of the right internal jugular vein. After creating a small venotomy incision, a 21 gauge needle was advanced into the right internal jugular vein under direct, real-time ultrasound guidance. Ultrasound image documentation was performed. After securing guidewire access a 6 French peel-away sheath was placed. A wire was kinked to measure appropriate catheter length. A 6 [place], dual-lumen power line was chosen for placement. This was tunneled in a retrograde fashion from the chest wall to the venotomy incision. The catheter was cut to 24 cm based on guidewire  measurement. The catheter was then placed through the peel-away sheath and the sheath removed. Final catheter positioning was confirmed and documented with a fluoroscopic spot image. The catheter was aspirated and flushed with saline. The venotomy incision was closed with subcuticular 4-0 Vicryl. Dermabond was applied to the incision. The catheter exit site was secured with Prolene retention sutures. COMPLICATIONS: None.  No pneumothorax. FINDINGS: After catheter placement, the tip lies at the SVC/RA junction. The catheter aspirates normally and is ready for immediate use. IMPRESSION: Placement of tunneled central venous catheter via the right internal jugular vein. The catheter tip lies at the SVC/RA junction. The catheter is ready for immediate use. Electronically Signed   By: Aletta Edouard M.D.   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 EXAM: TUNNELED CENTRAL VENOUS CATHETER PLACEMENT WITH ULTRASOUND AND FLUOROSCOPIC GUIDANCE ANESTHESIA/SEDATION: None MEDICATIONS: None FLUOROSCOPY TIME:  54 seconds.  11.0 mGy. PROCEDURE: The procedure, risks, benefits, and alternatives were explained to the patient. Questions regarding the procedure were encouraged and answered. The patient understands and consents to the procedure. A timeout was performed  prior to initiating the procedure. The right neck and chest were prepped with chlorhexidine in a sterile fashion, and a sterile drape was applied covering the operative field. Maximum barrier sterile technique with sterile gowns and gloves were used for the procedure. Local anesthesia was provided with 1% lidocaine. Ultrasound was used to confirm patency of the right internal jugular vein. After creating a small venotomy incision, a 21 gauge needle was advanced into the right internal jugular vein under  direct, real-time ultrasound guidance. Ultrasound image documentation was performed. After securing guidewire access a 6 French peel-away sheath was placed. A wire was kinked to measure appropriate catheter length. A 6 [place], dual-lumen power line was chosen for placement. This was tunneled in a retrograde fashion from the chest wall to the venotomy incision. The catheter was cut to 24 cm based on guidewire measurement. The catheter was then placed through the peel-away sheath and the sheath removed. Final catheter positioning was confirmed and documented with a fluoroscopic spot image. The catheter was aspirated and flushed with saline. The venotomy incision was closed with subcuticular 4-0 Vicryl. Dermabond was applied to the incision. The catheter exit site was secured with Prolene retention sutures. COMPLICATIONS: None.  No pneumothorax. FINDINGS: After catheter placement, the tip lies at the SVC/RA junction. The catheter aspirates normally and is ready for immediate use. IMPRESSION: Placement of tunneled central venous catheter via the right internal jugular vein. The catheter tip lies at the SVC/RA junction. The catheter is ready for immediate use. Electronically Signed   By: Aletta Edouard M.D.   On: 07/18/2020 10:17     LOS: 9 days   Antonieta Pert, MD [facility]  07/18/2020, 10:47 AM

## 2020-07-19 LAB — CBC
HCT: 25.7 % — ABNORMAL LOW (ref 39.0–52.0)
Hemoglobin: 8.2 g/dL — ABNORMAL LOW (ref 13.0–17.0)
MCH: 29.6 pg (ref 26.0–34.0)
MCHC: 31.9 g/dL (ref 30.0–36.0)
MCV: 92.8 fL (ref 80.0–100.0)
Platelets: 436 10*3/uL — ABNORMAL HIGH (ref 150–400)
RBC: 2.77 MIL/uL — ABNORMAL LOW (ref 4.22–5.81)
RDW: 17.2 % — ABNORMAL HIGH (ref 11.5–15.5)
WBC: 9.7 10*3/uL (ref 4.0–10.5)
nRBC: 0 % (ref 0.0–0.2)

## 2020-07-19 LAB — BASIC METABOLIC PANEL
Anion gap: 6 (ref 5–15)
BUN: 16 mg/dL (ref 8–23)
CO2: 24 mmol/L (ref 22–32)
Calcium: 7.7 mg/dL — ABNORMAL LOW (ref 8.9–10.3)
Chloride: 97 mmol/L — ABNORMAL LOW (ref 98–111)
Creatinine, Ser: 0.72 mg/dL (ref 0.61–1.24)
GFR, Estimated: >60 mL/min (ref >60)
Glucose, Bld: 165 mg/dL — ABNORMAL HIGH (ref 70–99)
Potassium: 4.2 mmol/L (ref 3.5–5.1)
Sodium: 127 mmol/L — ABNORMAL LOW (ref 135–145)

## 2020-07-19 LAB — GLUCOSE, CAPILLARY
Glucose-Capillary: 78 mg/dL (ref 70–99)
Glucose-Capillary: 183 mg/dL — ABNORMAL HIGH (ref 70–99)
Glucose-Capillary: 124 mg/dL — ABNORMAL HIGH (ref 70–99)
Glucose-Capillary: 135 mg/dL — ABNORMAL HIGH (ref 70–99)
Glucose-Capillary: 178 mg/dL — ABNORMAL HIGH (ref 70–99)

## 2020-07-19 MED ORDER — VITAMIN D3 25 MCG (1000 UNIT) PO TABS
5000.0000 [IU] | ORAL_TABLET | Freq: Every day | ORAL | Status: DC
  Administered 2020-07-19 – 2020-07-22 (×4): 5000 [IU] via ORAL
  Filled 2020-07-19 (×4): qty 5

## 2020-07-19 MED ORDER — ISOSORBIDE MONONITRATE ER 30 MG PO TB24
30.0000 mg | ORAL_TABLET | Freq: Every day | ORAL | Status: DC
  Administered 2020-07-19: 30 mg via ORAL
  Filled 2020-07-19: qty 1

## 2020-07-19 MED ORDER — CARVEDILOL 3.125 MG PO TABS
3.1250 mg | ORAL_TABLET | Freq: Two times a day (BID) | ORAL | Status: DC
  Administered 2020-07-19 – 2020-07-22 (×6): 3.125 mg via ORAL
  Filled 2020-07-19 (×6): qty 1

## 2020-07-19 MED ORDER — SACUBITRIL-VALSARTAN 49-51 MG PO TABS
1.0000 | ORAL_TABLET | Freq: Two times a day (BID) | ORAL | Status: DC
  Administered 2020-07-19 – 2020-07-20 (×2): 1 via ORAL
  Filled 2020-07-19 (×2): qty 1

## 2020-07-19 MED ORDER — LINAGLIPTIN 5 MG PO TABS
5.0000 mg | ORAL_TABLET | Freq: Every day | ORAL | Status: DC
  Administered 2020-07-19 – 2020-07-22 (×4): 5 mg via ORAL
  Filled 2020-07-19 (×4): qty 1

## 2020-07-19 MED ORDER — BUPROPION HCL ER (XL) 300 MG PO TB24: 300.0000 mg | ORAL_TABLET | Freq: Every day | ORAL | Status: DC

## 2020-07-19 NOTE — Progress Notes (Signed)
PROGRESS NOTE    [name]  NKN:[id number] DOB: [birth year] DOA: 07/09/2020 PCP: Doreatha Lew, MD   Chief Complaint  Patient presents with  . Arm Pain  . Hypotension  Brief Narrative: 74 year old male with complex medical history with diabetes, PAD, CAD with CABG presented with weakness, fever and septic shock secondary to group B streptococcal bacteremia secondary to septic left shoulder arthritis, s/p arthroscopic I &D and washout on 07/10/20. Left septic shoulder aspirate grew group D strep on right shoulder culture grew Pseudomonas. Followed by ID and orthopedics. 12/2-significant agitation and attempting to get out of bed. 12/2-left shoulder percutaneous hematoma evacuation with placement of drain, right shoulder arthroscopic lavage and extensive debridement with placement of drain by Dr Gardiner Sleeper- PICC.  Subjective:  Patient resting. Nursing reports patient has been taking his nitroglycerin and likely blood pressure is low yesterday.  Denies any chest pain this morning.  Complains of pain in his shoulders. Left shoulder JP drain fell out while ambulating in orthopedic team was notified by nursing staff yesterday. Afebrile, blood pressure has been soft Coreg was held. Right shoulder JP drain had 20 ml output. 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  Continue dressing and drain management, left jp  drain fell off 12/3. Given bony change on MRI will need protracted IV and oral antibiotics.  12/3 s/p PICC. Cont cefepime as per ID-planned for iv abx x 8 wk through January 28 followed by oral Levaquin.  Elevated troponin suspect demand ischemia from septic shock/CAD/CABG: Troponin peaked and trended down.  Seen by cardiology EKG was done without ischemic finding.  Continue aspirin Brilinta, decrease dose of Coreg due to soft blood pressure.  Patient denies any chest pain currently.  Resume his Imdur 30 mg, and tomorrow increase to 60 home dose if blood pressure stable.  CHF with preserved EF last echo 11/25 with EF 50 to 55%: patient meds were held due to sepsis and hypotension.  He is normally high Entresto 49-21, Lasix 20 mg twice daily, Imdur.  Introducing meds slowly.    PAD,Status post aortobifemoral bypass surgery: Continue antiplatelets as above. History of carotid endarterectomy 2012: Continue antiplatelets as above. HLD  Continue on antiplatelets as above with aspirin and Brilinta, PCsk9 inhibitor  BPH: Foley discontinued 12/3, continue Flomax, continue voiding trial/PVR monitoring  Encourage ambulation.  Acute metabolic encephalopathy/delirium/sundowning in the setting of multiple factors with sepsis.  Overall he seems to have improved.   Anemia of chronic disease: S/p 1 unit PRBC 11/27, monitor H&H transfuse if less than 7 g. HB 8-9GM stable.  COPD not in exacerbation continue bronchodilators supplemental oxygen, will do weaning trial of oxygen, not on home oxygen normally   Hyponatremia sodium at 131.    Type 2 diabetes mellitus with hyperlipidemia:hbA1c 7.2 9/27 blood sugar has been stable on sliding scale continue sliding scale insulin every 4 hours. Recent Labs  Lab 07/18/20 1228 07/18/20 1556 07/18/20 1947 07/18/20 2332 07/19/20 0539  GLUCAP 118* 163* 187* 166* 78   Nutrition: Diet Order  Diet Heart Room service appropriate? Yes; Fluid consistency: Thin   Diet effective now                Body mass index is 27.21 kg/m. DVT prophylaxis: enoxaparin (LOVENOX) injection 40 mg Start: 07/14/20 2200 Place and maintain sequential compression device Start: 07/10/20 1229 Code Status:   Code Status: Full Code Family Communication: plan of care discussed with patient at bedside.  Status is: Inpatient Remains inpatient appropriate because:IV treatments appropriate due to intensity of illness or inability to take PO and Inpatient level of care appropriate due to severity of illness  Dispo: The patient is from: Home              Anticipated d/c is to: SNF              Anticipated d/c date is: 2 days              Patient currently is not medically stable to d/c. Consultants:see note  Procedures:see note  Culture/Microbiology    Component Value Date/Time   SDES  07/13/2020 0845    JOINT FLUID Performed at [facility], [place] [address] [place], [place] [address]    SPECREQUEST  07/13/2020 0845    RIGHT SHOULDER Performed at [facility], [name] [address] [place], [place] [address]    CULT  07/13/2020 0845    RARE PSEUDOMONAS AERUGINOSA CRITICAL RESULT CALLED TO, READ BACK BY AND VERIFIED WITH: Bernette Mayers RN, AT [phone] 07/17/20 BY D. Victoriano Lain REGARDING CULTURE GROWTH Performed at [facility], [place] [address], [place], [place] [address]    REPTSTATUS 07/17/2020 FINAL 07/13/2020 0845    Other culture-see note  Medications: Scheduled Meds: . aspirin EC  81 mg Oral Daily  . buPROPion  300 mg Oral Daily  . carvedilol  6.25 mg Oral BID WC  . Chlorhexidine Gluconate Cloth  6 each Topical Daily  . docusate sodium  100 mg Oral BID  . enoxaparin (LOVENOX) injection  40 mg Subcutaneous Q24H  . gabapentin  400 mg Oral Daily  . gabapentin  800 mg Oral QHS  . insulin aspart  0-15 Units Subcutaneous Q4H  . pantoprazole  40 mg Oral Daily  . rOPINIRole  1 mg Oral TID  . tamsulosin  0.4 mg Oral Daily  .  ticagrelor  90 mg Oral BID   Continuous Infusions: . sodium chloride 250 mL (07/18/20 1311)  . ceFEPime (MAXIPIME) IV 2 g (07/19/20 7408)  . methocarbamol (ROBAXIN) IV 500 mg (07/18/20 1428)    Antimicrobials: Anti-infectives (From admission, onward)   Start     Dose/Rate Route Frequency Ordered Stop   07/17/20 1600  ceFAZolin (ANCEF) IVPB 2g/100 mL premix        2 g 200 mL/hr over 30 Minutes Intravenous On call to O.R. 07/17/20 1441 07/17/20 1716   07/16/20 0900  ceFEPIme (MAXIPIME) 2 g in sodium chloride 0.9 % 100 mL IVPB        2 g 200 mL/hr over 30 Minutes Intravenous Every 8 hours 07/16/20 0805     07/12/20 1800  penicillin G potassium 12 Million Units in dextrose 5 % 500 mL continuous infusion  Status:  Discontinued        12 Million Units 41.7 mL/hr over 12 Hours Intravenous Every 12 hours 07/12/20 1100 07/16/20 0805   07/11/20 1800  cefTRIAXone (ROCEPHIN) 2 g in sodium chloride 0.9 % 100 mL IVPB  Status:  Discontinued  2 g 200 mL/hr over 30 Minutes Intravenous Every 24 hours 07/11/20 1235 07/12/20 1100   07/10/20 2200  cefTRIAXone (ROCEPHIN) 2 g in sodium chloride 0.9 % 100 mL IVPB  Status:  Discontinued        2 g 200 mL/hr over 30 Minutes Intravenous Every 24 hours 07/09/20 2347 07/10/20 0926   07/10/20 1000  vancomycin (VANCOCIN) IVPB 1000 mg/200 mL premix  Status:  Discontinued        1,000 mg 200 mL/hr over 60 Minutes Intravenous Every 12 hours 07/10/20 0033 07/11/20 1235   07/10/20 1000  ceFEPIme (MAXIPIME) 2 g in sodium chloride 0.9 % 100 mL IVPB  Status:  Discontinued        2 g 200 mL/hr over 30 Minutes Intravenous Every 8 hours 07/10/20 0939 07/11/20 1235   07/09/20 2100  vancomycin (VANCOREADY) IVPB 1750 mg/350 mL        1,750 mg 175 mL/hr over 120 Minutes Intravenous STAT 07/09/20 2045 07/09/20 2333   07/09/20 2045  vancomycin (VANCOCIN) IVPB 1000 mg/200 mL premix  Status:  Discontinued        1,000 mg 200 mL/hr over 60 Minutes Intravenous  Once 07/09/20  2041 07/09/20 2045   07/09/20 2045  cefTRIAXone (ROCEPHIN) 2 g in sodium chloride 0.9 % 100 mL IVPB        2 g 200 mL/hr over 30 Minutes Intravenous  Once 07/09/20 2041 07/09/20 2209     Objective: Vitals: Today's Vitals   07/18/20 2121 07/18/20 2157 07/19/20 0542 07/19/20 0638  BP:   119/61   Pulse:   78   Resp:   18   Temp:   (!) 97.4 F (36.3 C)   TempSrc:      SpO2:   100%   Weight:      Height:      PainSc: 7  Asleep  7     Intake/Output Summary (Last 24 hours) at 07/19/2020 0759 Last data filed at 07/19/2020 0728 Gross per 24 hour  Intake 1786.79 ml  Output 550 ml  Net 1236.79 ml   Filed Weights   07/09/20 2017 07/10/20 0130 07/12/20 0515  Weight: 86.2 kg 92.5 kg 91 kg   Weight change:   Intake/Output from previous day: 12/03 0701 - 12/04 0700 In: 1686.8 [P.O.:740; IV Piggyback:946.8] Out: 550 [Urine:500; Drains:50] Intake/Output this shift: Total I/O In: 100 [IV Piggyback:100] Out: -   Examination: General exam: AAOx3 , NAD, weak appearing. HEENT:Oral mucosa moist, Ear/Nose WNL grossly, dentition normal. Respiratory system: bilaterally clear,no wheezing or crackles,no use of accessory muscle Cardiovascular system: S1 & S2 +, No JVD,. Gastrointestinal system: Abdomen soft, NT,ND, BS+ Nervous System:Alert, awake, moving extremities and grossly nonfocal Extremities: No edema, distal peripheral pulses palpable.  Bilateral shoulder with dressing intact, drain on the right shoulder. Skin: No rashes,no icterus. MSK: Normal muscle bulk,tone, power Tunneled PICC line on right chest.  Data Reviewed: I have personally reviewed following labs and imaging studies CBC: Recent Labs  Lab 07/12/20 1704 07/13/20 0603 07/14/20 0420 07/17/20 0314 07/18/20 0535  WBC  --  11.5* 9.4 11.3* 10.2  NEUTROABS  --   --   --   --  8.9*  HGB 9.2* 10.2* 9.8* 8.6* 8.8*  HCT 27.9* 30.8* 29.1* 27.1* 27.9*  MCV  --  87.5 86.6 92.5 93.6  PLT  --  413* 403* 376 657   Basic  Metabolic Panel: Recent Labs  Lab 07/12/20 1704 07/13/20 0603 07/14/20 0420 07/16/20 1955 07/17/20 0314 07/18/20  0535  NA 130* 131* 134*  --  130* 131*  K 3.8 3.6 3.6  --  4.1 4.3  CL 97* 97* 96*  --  95* 98  CO2 22 23 28   --  29 23  GLUCOSE 181* 175* 165*  --  152* 152*  BUN 22 23 22   --  14 13  CREATININE 0.78 0.73 0.66  --  0.84 0.61  CALCIUM 8.1* 8.3* 8.6*  --  8.2* 8.2*  MG  --   --   --  2.0  --  2.0  PHOS  --   --   --  3.2  --  3.2   GFR: Estimated Creatinine Clearance: 88.9 mL/min (by C-G formula based on SCr of 0.61 mg/dL). Liver Function Tests: Recent Labs  Lab 07/14/20 0420 07/17/20 0314 07/18/20 0535  AST 67* 18 15  ALT 34 25 20  ALKPHOS  --  103 88  BILITOT  --  0.5 0.6  PROT  --  6.3* 6.0*  ALBUMIN  --  2.3* 2.2*   No results for input(s): LIPASE, AMYLASE in the last 168 hours. No results for input(s): AMMONIA in the last 168 hours. Coagulation Profile: No results for input(s): INR, PROTIME in the last 168 hours. Cardiac Enzymes: No results for input(s): CKTOTAL, CKMB, CKMBINDEX, TROPONINI in the last 168 hours. BNP (last 3 results) No results for input(s): PROBNP in the last 8760 hours. HbA1C: No results for input(s): HGBA1C in the last 72 hours. CBG: Recent Labs  Lab 07/18/20 1228 07/18/20 1556 07/18/20 1947 07/18/20 2332 07/19/20 0539  GLUCAP 118* 163* 187* 166* 78   Lipid Profile: No results for input(s): CHOL, HDL, LDLCALC, TRIG, CHOLHDL, LDLDIRECT in the last 72 hours. Thyroid Function Tests: No results for input(s): TSH, T4TOTAL, FREET4, T3FREE, THYROIDAB in the last 72 hours. Anemia Panel: No results for input(s): VITAMINB12, FOLATE, FERRITIN, TIBC, IRON, RETICCTPCT in the last 72 hours. Sepsis Labs: No results for input(s): PROCALCITON, LATICACIDVEN in the last 168 hours.  Recent Results (from the past 240 hour(s))  Blood Culture (routine x 2)     Status: None   Collection Time: 07/09/20  9:05 PM   Specimen: BLOOD LEFT FOREARM   Result Value Ref Range Status   Specimen Description   Final    BLOOD LEFT FOREARM Performed at [facility] [address] [place], [place] [address]    Special Requests   Final    BOTTLES DRAWN AEROBIC AND ANAEROBIC Blood Culture results may not be optimal due to an excessive volume of blood received in culture bottles Performed at [facility] [address], [place], [place] [address]    Culture   Final    NO GROWTH 5 DAYS Performed at [facility], [place] [address], [place], [place] [address]    Report Status 07/15/2020 FINAL  Final  Blood Culture (routine x 2)     Status: Abnormal   Collection Time: 07/09/20  9:05 PM   Specimen: BLOOD RIGHT FOREARM  Result Value Ref Range Status   Specimen Description   Final    BLOOD RIGHT FOREARM Performed at [facility] [address] [place], [place] [address]    Special Requests   Final    BOTTLES DRAWN AEROBIC AND ANAEROBIC Blood Culture adequate volume Performed at [facility] [address] [place],  [address]    Culture  Setup Time   Final    Lonell Grandchild  POSITIVE COCCI IN CHAINS IN BOTH AEROBIC AND ANAEROBIC BOTTLES CRITICAL RESULT CALLED TO, READ BACK BY AND VERIFIED WITHDomenick Gong PHARMD 1631 07/10/20 A BROWNING Performed at [facility], [place] [address] [place], [place] [address]    Culture GROUP B STREP(S.AGALACTIAE)ISOLATED (A)  Final   Report Status 07/12/2020 FINAL  Final   Organism ID, Bacteria GROUP B STREP(S.AGALACTIAE)ISOLATED  Final      Susceptibility   Group b strep(s.agalactiae)isolated - MIC*    CLINDAMYCIN >=1 RESISTANT Resistant     AMPICILLIN <=0.25 SENSITIVE Sensitive     ERYTHROMYCIN >=8 RESISTANT Resistant     VANCOMYCIN 0.5 SENSITIVE Sensitive     CEFTRIAXONE <=0.12 SENSITIVE Sensitive     LEVOFLOXACIN 0.5 SENSITIVE Sensitive     PENICILLIN Value in next row Sensitive      SENSITIVE0.06    * GROUP B  STREP(S.AGALACTIAE)ISOLATED  Blood Culture ID Panel (Reflexed)     Status: Abnormal   Collection Time: 07/09/20  9:05 PM  Result Value Ref Range Status   Enterococcus faecalis NOT DETECTED NOT DETECTED Final   Enterococcus Faecium NOT DETECTED NOT DETECTED Final   Listeria monocytogenes NOT DETECTED NOT DETECTED Final   Staphylococcus species NOT DETECTED NOT DETECTED Final   Staphylococcus aureus (BCID) NOT DETECTED NOT DETECTED Final   Staphylococcus epidermidis NOT DETECTED NOT DETECTED Final   Staphylococcus lugdunensis NOT DETECTED NOT DETECTED Final   Streptococcus species DETECTED (A) NOT DETECTED Final    Comment: CRITICAL RESULT CALLED TO, READ BACK BY AND VERIFIED WITH: S CHRISTY PHARMD 1631 07/10/20 A BROWNING    Streptococcus agalactiae DETECTED (A) NOT DETECTED Final    Comment: CRITICAL RESULT CALLED TO, READ BACK BY AND VERIFIED WITH: S CHRISTY PHARMD 1631 07/10/20 A BROWNING    Streptococcus pneumoniae NOT DETECTED NOT DETECTED Final   Streptococcus pyogenes NOT DETECTED NOT DETECTED Final   A.calcoaceticus-baumannii NOT DETECTED NOT DETECTED Final   Bacteroides fragilis NOT DETECTED NOT DETECTED Final   Enterobacterales NOT DETECTED NOT DETECTED Final   Enterobacter cloacae complex NOT DETECTED NOT DETECTED Final   Escherichia coli NOT DETECTED NOT DETECTED Final   Klebsiella aerogenes NOT DETECTED NOT DETECTED Final   Klebsiella oxytoca NOT DETECTED NOT DETECTED Final   Klebsiella pneumoniae NOT DETECTED NOT DETECTED Final   Proteus species NOT DETECTED NOT DETECTED Final   Salmonella species NOT DETECTED NOT DETECTED Final   Serratia marcescens NOT DETECTED NOT DETECTED Final   Haemophilus influenzae NOT DETECTED NOT DETECTED Final   Neisseria meningitidis NOT DETECTED NOT DETECTED Final   Pseudomonas aeruginosa NOT DETECTED NOT DETECTED Final   Stenotrophomonas maltophilia NOT DETECTED NOT DETECTED Final   Candida albicans NOT DETECTED NOT DETECTED Final    Candida auris NOT DETECTED NOT DETECTED Final   Candida glabrata NOT DETECTED NOT DETECTED Final   Candida krusei NOT DETECTED NOT DETECTED Final   Candida parapsilosis NOT DETECTED NOT DETECTED Final   Candida tropicalis NOT DETECTED NOT DETECTED Final   Cryptococcus neoformans/gattii NOT DETECTED NOT DETECTED Final    Comment: Performed at [facility], [place] [address], [place], [place] [address]  Resp Panel by RT-PCR (Flu A&B, Covid) Nasopharyngeal Swab     Status: None   Collection Time: 07/09/20  9:06 PM   Specimen: Nasopharyngeal Swab; Nasopharyngeal(NP) swabs in vial transport medium  Result Value Ref Range Status   SARS Coronavirus 2 by RT PCR NEGATIVE NEGATIVE Final    Comment: (NOTE) SARS-CoV-2 target nucleic acids are NOT  DETECTED.  The SARS-CoV-2 RNA is generally detectable in upper respiratory specimens during the acute phase of infection. The lowest concentration of SARS-CoV-2 viral copies this assay can detect is 138 copies/mL. A negative result does not preclude SARS-Cov-2 infection and should not be used as the sole basis for treatment or other patient management decisions. A negative result may occur with  improper specimen collection/handling, submission of specimen other than nasopharyngeal swab, presence of viral mutation(s) within the areas targeted by this assay, and inadequate number of viral copies(<138 copies/mL). A negative result must be combined with clinical observations, patient history, and epidemiological information. The expected result is Negative.  Fact Sheet for Patients:  EntrepreneurPulse.com.au  Fact Sheet for Healthcare Providers:  IncredibleEmployment.be  This test is no t yet approved or cleared by the [place] FDA and  has been authorized for detection and/or diagnosis of SARS-CoV-2 by FDA under an Emergency Use Authorization (EUA). This EUA will remain  in effect (meaning this test can be  used) for the duration of the COVID-19 declaration under Section 564(b)(1) of the Act, 21 U.S.C.section 360bbb-3(b)(1), unless the authorization is terminated  or revoked sooner.       Influenza A by PCR NEGATIVE NEGATIVE Final   Influenza B by PCR NEGATIVE NEGATIVE Final    Comment: (NOTE) The Xpert Xpress SARS-CoV-2/FLU/RSV plus assay is intended as an aid in the diagnosis of influenza from Nasopharyngeal swab specimens and should not be used as a sole basis for treatment. Nasal washings and aspirates are unacceptable for Xpert Xpress SARS-CoV-2/FLU/RSV testing.  Fact Sheet for Patients: EntrepreneurPulse.com.au  Fact Sheet for Healthcare Providers: IncredibleEmployment.be  This test is not yet approved or cleared by the [place] FDA and has been authorized for detection and/or diagnosis of SARS-CoV-2 by FDA under an Emergency Use Authorization (EUA). This EUA will remain in effect (meaning this test can be used) for the duration of the COVID-19 declaration under Section 564(b)(1) of the Act, 21 U.S.C. section 360bbb-3(b)(1), unless the authorization is terminated or revoked.  Performed at [facility], [place] [address], [place], [place] [address]   Body fluid culture     Status: None   Collection Time: 07/09/20 10:00 PM   Specimen: Synovium; Body Fluid  Result Value Ref Range Status   Specimen Description   Final    SYNOVIAL LEFT SHOULDER Performed at [facility] [address] [place], [place] [address]    Special Requests   Final    NONE Performed at [facility], [place] [address], [place], [place] [address]    Gram Stain   Final    MODERATE WBC PRESENT, PREDOMINANTLY PMN NO ORGANISMS SEEN RESULT CALLED TO, READ BACK BY AND VERIFIED WITH: RN J DAVISON AT 2251 07/09/20 CRUICKSHANK A Performed at [facility], [place] [address] [place], [place]  [address]    Culture   Final    RARE GROUP B STREP(S.AGALACTIAE)ISOLATED TESTING AGAINST S. AGALACTIAE NOT ROUTINELY PERFORMED DUE TO PREDICTABILITY OF AMP/PEN/VAN SUSCEPTIBILITY. CRITICAL RESULT CALLED TO, READ BACK BY AND VERIFIED WITH: A. MANSFIELD RN, AT 5701 07/11/20 BY D. VANHOOK REGARDING GROWTH Performed at [facility], [place] [address] [place], [place] [address]    Report Status 07/12/2020 FINAL  Final  Urine culture     Status: Abnormal   Collection Time: 07/10/20  1:00 AM   Specimen: In/Out Cath Urine  Result Value Ref Range Status   Specimen Description   Final  IN/OUT CATH URINE Performed at [facility], [place] [address] [place], [place] [address]    Special Requests   Final    NONE Performed at [facility], [place] [address], [place], [place] [address]    Culture (A)  Final    10,000 COLONIES/mL PSEUDOMONAS AERUGINOSA 10,000 COLONIES/mL STREPTOCOCCUS AGALACTIAE TESTING AGAINST S. AGALACTIAE NOT ROUTINELY PERFORMED DUE TO PREDICTABILITY OF AMP/PEN/VAN SUSCEPTIBILITY. Performed at [facility], [place] [address] [place], [place] [address]    Report Status 07/12/2020 FINAL  Final   Organism ID, Bacteria PSEUDOMONAS AERUGINOSA (A)  Final      Susceptibility   Pseudomonas aeruginosa - MIC*    CEFTAZIDIME 2 SENSITIVE Sensitive     CIPROFLOXACIN <=0.25 SENSITIVE Sensitive     GENTAMICIN <=1 SENSITIVE Sensitive     IMIPENEM 2 SENSITIVE Sensitive     PIP/TAZO <=4 SENSITIVE Sensitive     CEFEPIME 2 SENSITIVE Sensitive     * 10,000 COLONIES/mL PSEUDOMONAS AERUGINOSA  MRSA PCR Screening     Status: None   Collection Time: 07/10/20  1:15 AM   Specimen: Nasopharyngeal  Result Value Ref Range Status   MRSA by PCR NEGATIVE NEGATIVE Final    Comment:        The GeneXpert MRSA Assay (FDA approved for NASAL specimens only), is one component of a comprehensive MRSA colonization surveillance program. It is not intended to diagnose  MRSA infection nor to guide or monitor treatment for MRSA infections. Performed at [facility], [place] [address] [place], [place] [address]   Culture, blood (routine x 2)     Status: None   Collection Time: 07/11/20  9:55 AM   Specimen: BLOOD  Result Value Ref Range Status   Specimen Description   Final    BLOOD LEFT ANTECUBITAL Performed at [facility] [address], [place], [place] [address]    Special Requests   Final    BOTTLES DRAWN AEROBIC ONLY Blood Culture adequate volume Performed at [facility] [address], [place], [place] [address]    Culture   Final    NO GROWTH 5 DAYS Performed at [facility], [place] [address] [place], [place] [address]    Report Status 07/16/2020 FINAL  Final  Culture, blood (routine x 2)     Status: None   Collection Time: 07/11/20 10:01 AM   Specimen: BLOOD LEFT HAND  Result Value Ref Range Status   Specimen Description   Final    BLOOD LEFT HAND Performed at [facility] [address] [place], [place] [address]    Special Requests   Final    BOTTLES DRAWN AEROBIC ONLY Blood Culture adequate volume Performed at [facility] [address], [place], [place] [address]    Culture   Final    NO GROWTH 5 DAYS Performed at [facility], [place] [address] [place], [place] [address]    Report Status 07/16/2020 FINAL  Final  Body fluid culture     Status: None   Collection Time: 07/13/20  8:45 AM   Specimen: Joint, Right Shoulder; Synovial Fluid  Result Value Ref Range Status   Specimen Description   Final    JOINT FLUID Performed at [facility], [place] [address] [place], [place] [address]    Special Requests   Final    RIGHT SHOULDER Performed at [facility] [address]  [place], [place] [address]    Gram Stain   Final    RARE WBC PRESENT, PREDOMINANTLY MONONUCLEAR NO ORGANISMS  SEEN    Culture   Final    RARE PSEUDOMONAS AERUGINOSA CRITICAL RESULT CALLED TO, READ BACK BY AND VERIFIED WITH: Bernette Mayers RN, AT [phone] 07/17/20 BY D. Victoriano Lain REGARDING CULTURE GROWTH Performed at [facility], [place] [address], [place], [place] [address]    Report Status 07/17/2020 FINAL  Final   Organism ID, Bacteria PSEUDOMONAS AERUGINOSA  Final      Susceptibility   Pseudomonas aeruginosa - MIC*    CEFTAZIDIME 2 SENSITIVE Sensitive     CIPROFLOXACIN <=0.25 SENSITIVE Sensitive     GENTAMICIN <=1 SENSITIVE Sensitive     IMIPENEM 1 SENSITIVE Sensitive     PIP/TAZO <=4 SENSITIVE Sensitive     CEFEPIME 2 SENSITIVE Sensitive     * RARE PSEUDOMONAS AERUGINOSA     Radiology Studies: IR Fluoro Guide CV Line Right  Result Date: 07/18/2020 CLINICAL DATA:  Septic arthritis of the shoulders and need for tunneled central venous catheter for long-term IV antibiotic therapy. EXAM: TUNNELED CENTRAL VENOUS CATHETER PLACEMENT WITH ULTRASOUND AND FLUOROSCOPIC GUIDANCE ANESTHESIA/SEDATION: None MEDICATIONS: None FLUOROSCOPY TIME:  54 seconds.  11.0 mGy. PROCEDURE: The procedure, risks, benefits, and alternatives were explained to the patient. Questions regarding the procedure were encouraged and answered. The patient understands and consents to the procedure. A timeout was performed prior to initiating the procedure. The right neck and chest were prepped with chlorhexidine in a sterile fashion, and a sterile drape was applied covering the operative field. Maximum barrier sterile technique with sterile gowns and gloves were used for the procedure. Local anesthesia was provided with 1% lidocaine. Ultrasound was used to confirm patency of the right internal jugular vein. After creating a small venotomy incision, a 21 gauge needle was advanced into the right internal jugular vein under direct, real-time ultrasound guidance. Ultrasound image documentation was performed. After securing guidewire access a 6 French  peel-away sheath was placed. A wire was kinked to measure appropriate catheter length. A 6 [place], dual-lumen power line was chosen for placement. This was tunneled in a retrograde fashion from the chest wall to the venotomy incision. The catheter was cut to 24 cm based on guidewire measurement. The catheter was then placed through the peel-away sheath and the sheath removed. Final catheter positioning was confirmed and documented with a fluoroscopic spot image. The catheter was aspirated and flushed with saline. The venotomy incision was closed with subcuticular 4-0 Vicryl. Dermabond was applied to the incision. The catheter exit site was secured with Prolene retention sutures. COMPLICATIONS: None.  No pneumothorax. FINDINGS: After catheter placement, the tip lies at the SVC/RA junction. The catheter aspirates normally and is ready for immediate use. IMPRESSION: Placement of tunneled central venous catheter via the right internal jugular vein. The catheter tip lies at the SVC/RA junction. The catheter is ready for immediate use. Electronically Signed   By: Aletta Edouard M.D.   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 EXAM: TUNNELED CENTRAL VENOUS CATHETER PLACEMENT WITH ULTRASOUND AND FLUOROSCOPIC GUIDANCE ANESTHESIA/SEDATION: None MEDICATIONS: None FLUOROSCOPY TIME:  54 seconds.  11.0 mGy. PROCEDURE: The procedure, risks, benefits, and alternatives were explained to the patient. Questions regarding the procedure were encouraged and answered. The patient understands and consents to  the procedure. A timeout was performed prior to initiating the procedure. The right neck and chest were prepped with chlorhexidine in a sterile fashion, and a sterile drape was applied covering the operative field. Maximum barrier sterile technique with sterile  gowns and gloves were used for the procedure. Local anesthesia was provided with 1% lidocaine. Ultrasound was used to confirm patency of the right internal jugular vein. After creating a small venotomy incision, a 21 gauge needle was advanced into the right internal jugular vein under direct, real-time ultrasound guidance. Ultrasound image documentation was performed. After securing guidewire access a 6 French peel-away sheath was placed. A wire was kinked to measure appropriate catheter length. A 6 [place], dual-lumen power line was chosen for placement. This was tunneled in a retrograde fashion from the chest wall to the venotomy incision. The catheter was cut to 24 cm based on guidewire measurement. The catheter was then placed through the peel-away sheath and the sheath removed. Final catheter positioning was confirmed and documented with a fluoroscopic spot image. The catheter was aspirated and flushed with saline. The venotomy incision was closed with subcuticular 4-0 Vicryl. Dermabond was applied to the incision. The catheter exit site was secured with Prolene retention sutures. COMPLICATIONS: None.  No pneumothorax. FINDINGS: After catheter placement, the tip lies at the SVC/RA junction. The catheter aspirates normally and is ready for immediate use. IMPRESSION: Placement of tunneled central venous catheter via the right internal jugular vein. The catheter tip lies at the SVC/RA junction. The catheter is ready for immediate use. Electronically Signed   By: Aletta Edouard M.D.   On: 07/18/2020 10:17     LOS: 10 days   Antonieta Pert, MD [facility]  07/19/2020, 7:59 AM

## 2020-07-19 NOTE — Progress Notes (Signed)
Subjective: 2 Days Post-Op Procedure(s) (LRB): Right shoulder arthroscopic lavage; Left shoulder aspiration. (Bilateral)  Patient reports pain as mild to moderate.  Resting comfortably in bed. Denies fever, chills, N/V, CP, SOB.  Tolerating POs well.  Admits to flatus.  L drain incidentally fell out yesterday.  RN at bedside.  Objective:   VITALS:  Temp:  [97.4 F (36.3 C)-98 F (36.7 C)] 97.4 F (36.3 C) (12/04 0542) Pulse Rate:  [63-87] 78 (12/04 0542) Resp:  [18-20] 18 (12/04 0542) BP: (95-174)/(47-112) 141/58 (12/04 0830) SpO2:  [91 %-100 %] 100 % (12/04 0542)  General: WDWN patient in NAD. Psych:  Appropriate mood and affect. Neuro:  A&O x 3, Moving all extremities, sensation intact to light touch HEENT:  EOMs intact Chest:  Even non-labored respirations Skin: Dressing D/I with evidence of small amount of dried blood deep within the dressing, no rashes or lesions.  R drain intact with 10 cc of serosanguinous drainage. Extremities: warm/dry, mild edema to bilateral UE, no erythema or echymosis.  No lymphadenopathy. Pulses: Radial 2+ MSK:  ROM: Full Wrist ROM, MMT: 5/5 grip strength    LABS Recent Labs    07/17/20 0314 07/18/20 0535  HGB 8.6* 8.8*  WBC 11.3* 10.2  PLT 376 400   Recent Labs    07/17/20 0314 07/18/20 0535  NA 130* 131*  K 4.1 4.3  CL 95* 98  CO2 29 23  BUN 14 13  CREATININE 0.84 0.61  GLUCOSE 152* 152*   No results for input(s): LABPT, INR in the last 72 hours.   Assessment/Plan: 2 Days Post-Op Procedure(s) (LRB): Right shoulder arthroscopic lavage; Left shoulder aspiration. (Bilateral)  Patient seen in rounds for Dr. Onnie Graham R drain may be pulled once it is completely dry with no evidence of further drainage. ROM of bilateral UE as tolerated. May use UEs for transfers ABX per Medicine team.  Mechele Claude PA-C [facility] Office:  [phone]

## 2020-07-19 NOTE — Progress Notes (Signed)
Pt has home medications locked in pharmacy per policy. Please return to pt upon d/c.

## 2020-07-20 LAB — CBC
HCT: 25.6 % — ABNORMAL LOW (ref 39.0–52.0)
Hemoglobin: 8.3 g/dL — ABNORMAL LOW (ref 13.0–17.0)
MCH: 29.9 pg (ref 26.0–34.0)
MCHC: 32.4 g/dL (ref 30.0–36.0)
MCV: 92.1 fL (ref 80.0–100.0)
Platelets: 470 10*3/uL — ABNORMAL HIGH (ref 150–400)
RBC: 2.78 MIL/uL — ABNORMAL LOW (ref 4.22–5.81)
RDW: 17.2 % — ABNORMAL HIGH (ref 11.5–15.5)
WBC: 8.6 10*3/uL (ref 4.0–10.5)
nRBC: 0 % (ref 0.0–0.2)

## 2020-07-20 LAB — BASIC METABOLIC PANEL
Anion gap: 5 (ref 5–15)
BUN: 10 mg/dL (ref 8–23)
CO2: 25 mmol/L (ref 22–32)
Calcium: 7.7 mg/dL — ABNORMAL LOW (ref 8.9–10.3)
Chloride: 96 mmol/L — ABNORMAL LOW (ref 98–111)
Creatinine, Ser: 0.61 mg/dL (ref 0.61–1.24)
GFR, Estimated: >60 mL/min (ref >60)
Glucose, Bld: 134 mg/dL — ABNORMAL HIGH (ref 70–99)
Potassium: 4 mmol/L (ref 3.5–5.1)
Sodium: 126 mmol/L — ABNORMAL LOW (ref 135–145)

## 2020-07-20 LAB — GLUCOSE, CAPILLARY
Glucose-Capillary: 116 mg/dL — ABNORMAL HIGH (ref 70–99)
Glucose-Capillary: 116 mg/dL — ABNORMAL HIGH (ref 70–99)
Glucose-Capillary: 135 mg/dL — ABNORMAL HIGH (ref 70–99)
Glucose-Capillary: 146 mg/dL — ABNORMAL HIGH (ref 70–99)
Glucose-Capillary: 151 mg/dL — ABNORMAL HIGH (ref 70–99)
Glucose-Capillary: 157 mg/dL — ABNORMAL HIGH (ref 70–99)
Glucose-Capillary: 187 mg/dL — ABNORMAL HIGH (ref 70–99)

## 2020-07-20 MED ORDER — SODIUM CHLORIDE 1 G PO TABS
1.0000 g | ORAL_TABLET | Freq: Two times a day (BID) | ORAL | Status: AC
  Administered 2020-07-20 – 2020-07-21 (×3): 1 g via ORAL
  Filled 2020-07-20 (×3): qty 1

## 2020-07-20 MED ORDER — ISOSORBIDE MONONITRATE ER 60 MG PO TB24
60.0000 mg | ORAL_TABLET | Freq: Every day | ORAL | Status: DC
  Administered 2020-07-20 – 2020-07-22 (×3): 60 mg via ORAL
  Filled 2020-07-20 (×3): qty 1

## 2020-07-20 MED ORDER — INSULIN ASPART 100 UNIT/ML ~~LOC~~ SOLN
0.0000 [IU] | Freq: Three times a day (TID) | SUBCUTANEOUS | Status: DC
  Administered 2020-07-20: 1 [IU] via SUBCUTANEOUS
  Administered 2020-07-21: 2 [IU] via SUBCUTANEOUS
  Administered 2020-07-21 – 2020-07-22 (×3): 1 [IU] via SUBCUTANEOUS
  Administered 2020-07-22: 3 [IU] via SUBCUTANEOUS

## 2020-07-20 NOTE — Progress Notes (Signed)
Subjective: 3 Days Post-Op Procedure(s) (LRB): Right shoulder arthroscopic lavage; Left shoulder aspiration. (Bilateral) Patient reports pain as mild.   Patient seen in rounds for Dr. Onnie Graham.  Patient is resting in bed this morning on exam. He expresses a desire to be discharged home as soon as possible. He feels he is becoming weak by lying in bed here. He does report some soreness about the right clavicle area, near his central line.   Objective: Vital signs in last 24 hours: Temp:  [98 F (36.7 C)-98.2 F (36.8 C)] 98.2 F (36.8 C) (12/05 0437) Pulse Rate:  [70-86] 70 (12/05 0437) Resp:  [16-20] 16 (12/05 0437) BP: (108-121)/(54-61) 121/54 (12/05 0437) SpO2:  [94 %-98 %] 98 % (12/05 0437)  Intake/Output from previous day:  Intake/Output Summary (Last 24 hours) at 07/20/2020 0934 Last data filed at 07/20/2020 0819 Gross per 24 hour  Intake 840 ml  Output 2815 ml  Net -1975 ml     Intake/Output this shift: Total I/O In: -  Out: 10 [Drains:10]  Labs: Recent Labs    07/18/20 0535 07/19/20 0812  HGB 8.8* 8.2*   Recent Labs    07/18/20 0535 07/19/20 0812  WBC 10.2 9.7  RBC 2.98* 2.77*  HCT 27.9* 25.7*  PLT 400 436*   Recent Labs    07/18/20 0535 07/19/20 0812  NA 131* 127*  K 4.3 4.2  CL 98 97*  CO2 23 24  BUN 13 16  CREATININE 0.61 0.72  GLUCOSE 152* 165*  CALCIUM 8.2* 7.7*   No results for input(s): LABPT, INR in the last 72 hours.  Exam: General - Patient is Alert and Oriented Extremity - Neurologically intact Sensation intact distally Intact pulses distally  Dressing - dressing C/D/I, small amount of dried blood deep within the dressing on the right. Right drain intact with small amount of bloody drainage. Left dressing C/D/I. Mild edema of bilateral UE.  Motor Function - intact, moving wrist and fingers well on exam. Grip strength intact bilaterally.  Past Medical History:  Diagnosis Date  . Anemia    NOS  . Anxiety   . Blindness of  left eye    near blindness. 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 BPH (benign prostatic hypertrophy)   . Broken foot Oct. 12, 2013   Right foot Fx  . CAD (coronary artery disease)   . Carotid stenosis, left   . Cellulitis and abscess of leg 03/2018   right leg  . Chronic hyponatremia   . Chronic pain syndrome   . COPD (chronic obstructive pulmonary disease) ([place])   . Depression   . Diabetes mellitus   . Diabetic foot ulcer ([place])   . DJD (degenerative joint disease)    L wrist  . DM2 (diabetes mellitus, type 2) ([place])   . GERD (gastroesophageal reflux disease)   . HLD (hyperlipidemia)   . HTN (hypertension)   . Hx of blood transfusion reaction   . Hyponatremia   . Lumbar disc disease   . MI (mitral incompetence)   . Myocardial infarction ([place])    x 2  . Osteoporosis    tx per [facility], Dr Prudencio Burly, thought due to heavy steriod use after 1978  . Peripheral neuropathy   . Peripheral vascular disease ([place])   . Restless leg syndrome   . Spine fracture    hx, multiple  . Varicose veins   . 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 Vitamin D deficiency  Assessment/Plan: 3 Days Post-Op Procedure(s) (LRB): Right shoulder arthroscopic lavage; Left shoulder aspiration. (Bilateral) Active Problems:   Peripheral artery disease (HCC)   Hx of CABG   Septic shock (HCC)   Shortness of breath   Status post aortobifemoral bypass surgery   History of carotid endarterectomy   Type 2 diabetes mellitus with hyperlipidemia (HCC)   Diabetes mellitus type II, non insulin dependent (HCC)   Acute respiratory failure (HCC)   Pyogenic arthritis of left shoulder region [facility])   Acute pain of right shoulder   Sepsis due to group B Streptococcus (HCC)  Estimated body mass index is 27.21 kg/m as calculated from the following:   Height as of this encounter: 6' (1.829 m).   Weight as of this encounter: 91 kg.  Plan:  R drain may be pulled once it is completely dry with no evidence of further  drainage. ROM of bilateral UE as tolerated. May use UEs for transfers ABX per ID/Medicine team.  Griffith Citron, PA-C Orthopedic Surgery [phone] 07/20/2020, 9:34 AM

## 2020-07-20 NOTE — Progress Notes (Signed)
PROGRESS NOTE    [name]  LNL:[id number] DOB: 1946-05-24 DOA: 07/09/2020 PCP: Doreatha Lew, MD   Chief Complaint  Patient presents with  . Arm Pain  . Hypotension  Brief Narrative: 74 year old male with complex medical history with diabetes, PAD, CAD with CABG presented with weakness, fever and septic shock secondary to group B streptococcal bacteremia secondary to septic left shoulder arthritis, s/p arthroscopic I &D and washout on 07/10/20. Left septic shoulder aspirate grew group D strep on right shoulder culture grew Pseudomonas. Followed by ID and orthopedics. 12/2-significant agitation and attempting to get out of bed. 12/2-left shoulder percutaneous hematoma evacuation with placement of drain, right shoulder arthroscopic lavage and extensive debridement with placement of drain by Dr Gardiner Sleeper- PICC.  Subjective:  Patient had 130 mL right shoulder drain output. Left shoulder incidentally fallen off on 12/3 Afebrile overnight. 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  Right shoulder drain 120 mm.  Noted orthopedic plans to continue drain until it is dry, and continue on IV antibiotics as per infectious disease for 8-week course through January 28 with cefepime followed by Levaquin given bony changes  in MRI.   Elevated troponin suspect demand ischemia from septic shock/CAD/CABG: Troponin peaked and trended down.  Seen by cardiology EKG was done without ischemic finding.  No more chest pain.  Continue aspirin, Brilinta, Coreg, increase imdur to 60 mg home dose.  CHF with preserved EF last echo 11/25 with EF 50 to 55%: patient meds were held due to sepsis and hypotension.  Patient has been euvolemic now and resumed Entresto 49-21.  Home Lasix 20 mg twice daily on hold.   Hyponatremia monitor  PAD,Status post aortobifemoral bypass surgery: Continue antiplatelets as above. History of carotid endarterectomy 2012: Continue antiplatelets as above. HLD  Continue on antiplatelets as above with aspirin and Brilinta, PCsk9 inhibitor  BPH: Foley discontinued 12/3.  Monitor voiding trial, continue Flomax.  Increase activity.  Acute metabolic encephalopathy/delirium/sundowning in the setting of multiple factors with sepsis.  Resolved.  Anemia of chronic disease: S/p 1 unit PRBC 11/27.  Monitor H&H transfuse if less than 7 g. Recent Labs  Lab 07/14/20 0420 07/17/20 0314 07/18/20 0535 07/19/20 0812  HGB 9.8* 8.6* 8.8* 8.2*  HCT 29.1* 27.1* 27.9* 25.7*   COPD not in exacerbation continue bronchodilators supplemental oxygen, will do weaning trial of oxygen, not on home oxygen normally   Type 2 diabetes mellitus with hyperlipidemia:hbA1c 7.2 9/27 blood sugar has been stable-change every 4 to q. ACHS sliding scale.  Recent Labs  Lab 07/19/20 1712 07/19/20 2026 07/20/20 0010 07/20/20 0439 07/20/20 0728  GLUCAP 135* 178* 135* 116* 151*   Nutrition: Diet Order            Diet Heart Room service appropriate? Yes; Fluid consistency: Thin  Diet effective now                Body mass index is 27.21 kg/m. DVT prophylaxis: enoxaparin (LOVENOX)  injection 40 mg Start: 07/14/20 2200 Place and maintain sequential compression device Start: 07/10/20 1229 Code Status:   Code Status: Full  Code Family Communication: plan of care discussed with patient at bedside.  Status is: Inpatient Remains inpatient appropriate because:IV treatments appropriate due to intensity of illness or inability to take PO and Inpatient level of care appropriate due to severity of illness  Dispo: The patient is from: Home              Anticipated d/c is to: SNF refused by the patient so plan for home health              Anticipated d/c date is 1- 2 days once right shoulder drainage removed and okay with orthopedic              Patient currently is not medically stable to d/c. Consultants:see note  Procedures:see note  Culture/Microbiology    Component Value Date/Time   SDES  07/13/2020 0845    JOINT FLUID Performed at [facility], [place] [address] [place], [place] [address]    SPECREQUEST  07/13/2020 0845    RIGHT SHOULDER Performed at [facility], [place] [address], [place], [place] [address]    CULT  07/13/2020 0845    RARE PSEUDOMONAS AERUGINOSA CRITICAL RESULT CALLED TO, READ BACK BY AND VERIFIED WITH: Bernette Mayers RN, AT [phone] 07/17/20 BY D. Victoriano Lain REGARDING CULTURE GROWTH Performed at [facility], [place] [address] [place], [place] [address]    REPTSTATUS 07/17/2020 FINAL 07/13/2020 0845    Other culture-see note  Medications: Scheduled Meds: . aspirin EC  81 mg Oral Daily  . buPROPion  300 mg Oral Daily  . carvedilol  3.125 mg Oral BID WC  . Chlorhexidine Gluconate Cloth  6 each Topical Daily  . cholecalciferol  5,000 Units Oral Daily  . docusate sodium  100 mg Oral BID  . enoxaparin (LOVENOX) injection  40 mg Subcutaneous Q24H  . gabapentin  400 mg Oral Daily  . gabapentin  800 mg Oral QHS  . insulin aspart  0-9 Units Subcutaneous TID WC  . isosorbide mononitrate  60 mg Oral Daily  . linagliptin  5 mg Oral Daily  . pantoprazole  40 mg Oral Daily  . rOPINIRole  1 mg Oral TID  . sacubitril-valsartan  1 tablet Oral BID  . tamsulosin   0.4 mg Oral Daily  . ticagrelor  90 mg Oral BID   Continuous Infusions: . sodium chloride 250 mL (07/18/20 1311)  . ceFEPime (MAXIPIME) IV 2 g (07/20/20 0459)  . methocarbamol (ROBAXIN) IV 500 mg (07/18/20 1428)    Antimicrobials: Anti-infectives (From admission, onward)   Start     Dose/Rate Route Frequency Ordered Stop   07/17/20 1600  ceFAZolin (ANCEF) IVPB 2g/100 mL premix        2 g 200 mL/hr over 30 Minutes Intravenous On call to O.R. 07/17/20 1441 07/17/20 1716   07/16/20 0900  ceFEPIme (MAXIPIME) 2 g in sodium chloride 0.9 % 100 mL IVPB        2 g 200 mL/hr over 30 Minutes Intravenous Every 8 hours 07/16/20 0805     07/12/20 1800  penicillin G potassium 12 Million Units in dextrose 5 % 500 mL continuous infusion  Status:  Discontinued        12 Million Units 41.7 mL/hr over 12 Hours Intravenous Every 12 hours 07/12/20 1100 07/16/20 0805   07/11/20 1800  cefTRIAXone (ROCEPHIN) 2 g  in sodium chloride 0.9 % 100 mL IVPB  Status:  Discontinued        2 g 200 mL/hr over 30 Minutes Intravenous Every 24 hours 07/11/20 1235 07/12/20 1100   07/10/20 2200  cefTRIAXone (ROCEPHIN) 2 g in sodium chloride 0.9 % 100 mL IVPB  Status:  Discontinued        2 g 200 mL/hr over 30 Minutes Intravenous Every 24 hours 07/09/20 2347 07/10/20 0926   07/10/20 1000  vancomycin (VANCOCIN) IVPB 1000 mg/200 mL premix  Status:  Discontinued        1,000 mg 200 mL/hr over 60 Minutes Intravenous Every 12 hours 07/10/20 0033 07/11/20 1235   07/10/20 1000  ceFEPIme (MAXIPIME) 2 g in sodium chloride 0.9 % 100 mL IVPB  Status:  Discontinued        2 g 200 mL/hr over 30 Minutes Intravenous Every 8 hours 07/10/20 0939 07/11/20 1235   07/09/20 2100  vancomycin (VANCOREADY) IVPB 1750 mg/350 mL        1,750 mg 175 mL/hr over 120 Minutes Intravenous STAT 07/09/20 2045 07/09/20 2333   07/09/20 2045  vancomycin (VANCOCIN) IVPB 1000 mg/200 mL premix  Status:  Discontinued        1,000 mg 200 mL/hr over 60 Minutes  Intravenous  Once 07/09/20 2041 07/09/20 2045   07/09/20 2045  cefTRIAXone (ROCEPHIN) 2 g in sodium chloride 0.9 % 100 mL IVPB        2 g 200 mL/hr over 30 Minutes Intravenous  Once 07/09/20 2041 07/09/20 2209     Objective: Vitals: Today's Vitals   07/20/20 0419 07/20/20 0437 07/20/20 0552 07/20/20 0807  BP:  (!) 121/54    Pulse:  70    Resp:  16    Temp:  98.2 F (36.8 C)    TempSrc:  Oral    SpO2:  98%    Weight:      Height:      PainSc: Asleep  2  1     Intake/Output Summary (Last 24 hours) at 07/20/2020 1112 Last data filed at 07/20/2020 0932 Gross per 24 hour  Intake 1080 ml  Output 2815 ml  Net -1735 ml   Filed Weights   07/09/20 2017 07/10/20 0130 07/12/20 0515  Weight: 86.2 kg 92.5 kg 91 kg   Weight change:   Intake/Output from previous day: 12/04 0701 - 12/05 0700 In: 1180 [P.O.:1080; IV Piggyback:100] Out: 2805 [Urine:2675; Drains:130] Intake/Output this shift: Total I/O In: 240 [P.O.:240] Out: 10 [Drains:10]  Examination: General exam: AAOx3 , NAD, weak appearing. HEENT:Oral mucosa moist, Ear/Nose WNL grossly, dentition normal. Respiratory system: bilaterally clear,no wheezing or crackles,no use of accessory muscle Cardiovascular system: S1 & S2 +, No JVD,. Gastrointestinal system: Abdomen soft, NT,ND, BS+ Nervous System:Alert, awake, moving extremities and grossly nonfocal Extremities: No edema, distal peripheral pulses palpable.  Dressing intact in bilateral shoulder Skin: No rashes,no icterus. MSK: Normal muscle bulk,tone, power Right chest. W/ tunneled PICC.  Right shoulder w/ drain + with serosanguineous output  Data Reviewed: I have personally reviewed following labs and imaging studies CBC: Recent Labs  Lab 07/14/20 0420 07/17/20 0314 07/18/20 0535 07/19/20 0812  WBC 9.4 11.3* 10.2 9.7  NEUTROABS  --   --  8.9*  --   HGB 9.8* 8.6* 8.8* 8.2*  HCT 29.1* 27.1* 27.9* 25.7*  MCV 86.6 92.5 93.6 92.8  PLT 403* 376 400 436*   Basic  Metabolic Panel: Recent Labs  Lab 07/14/20 0420 07/16/20 1955 07/17/20 0314  07/18/20 0535 07/19/20 0812  NA 134*  --  130* 131* 127*  K 3.6  --  4.1 4.3 4.2  CL 96*  --  95* 98 97*  CO2 28  --  29 23 24   GLUCOSE 165*  --  152* 152* 165*  BUN 22  --  14 13 16   CREATININE 0.66  --  0.84 0.61 0.72  CALCIUM 8.6*  --  8.2* 8.2* 7.7*  MG  --  2.0  --  2.0  --   PHOS  --  3.2  --  3.2  --    GFR: Estimated Creatinine Clearance: 88.9 mL/min (by C-G formula based on SCr of 0.72 mg/dL). Liver Function Tests: Recent Labs  Lab 07/14/20 0420 07/17/20 0314 07/18/20 0535  AST 67* 18 15  ALT 34 25 20  ALKPHOS  --  103 88  BILITOT  --  0.5 0.6  PROT  --  6.3* 6.0*  ALBUMIN  --  2.3* 2.2*   No results for input(s): LIPASE, AMYLASE in the last 168 hours. No results for input(s): AMMONIA in the last 168 hours. Coagulation Profile: No results for input(s): INR, PROTIME in the last 168 hours. Cardiac Enzymes: No results for input(s): CKTOTAL, CKMB, CKMBINDEX, TROPONINI in the last 168 hours. BNP (last 3 results) No results for input(s): PROBNP in the last 8760 hours. HbA1C: No results for input(s): HGBA1C in the last 72 hours. CBG: Recent Labs  Lab 07/19/20 1712 07/19/20 2026 07/20/20 0010 07/20/20 0439 07/20/20 0728  GLUCAP 135* 178* 135* 116* 151*   Lipid Profile: No results for input(s): CHOL, HDL, LDLCALC, TRIG, CHOLHDL, LDLDIRECT in the last 72 hours. Thyroid Function Tests: No results for input(s): TSH, T4TOTAL, FREET4, T3FREE, THYROIDAB in the last 72 hours. Anemia Panel: No results for input(s): VITAMINB12, FOLATE, FERRITIN, TIBC, IRON, RETICCTPCT in the last 72 hours. Sepsis Labs: No results for input(s): PROCALCITON, LATICACIDVEN in the last 168 hours.  Recent Results (from the past 240 hour(s))  Culture, blood (routine x 2)     Status: None   Collection Time: 07/11/20  9:55 AM   Specimen: BLOOD  Result Value Ref Range Status   Specimen Description   Final     BLOOD LEFT ANTECUBITAL Performed at [place] [address] [place], [place] [address]    Special Requests   Final    BOTTLES DRAWN AEROBIC ONLY Blood Culture adequate volume Performed at [facility] [address] [place], [place] [address]    Culture   Final    NO GROWTH 5 DAYS Performed at [facility], [place] [address] [place], [place] [address]    Report Status 07/16/2020 FINAL  Final  Culture, blood (routine x 2)     Status: None   Collection Time: 07/11/20 10:01 AM   Specimen: BLOOD LEFT HAND  Result Value Ref Range Status   Specimen Description   Final    BLOOD LEFT HAND Performed at [facility] [address], [place], [place] [address]    Special Requests   Final    BOTTLES DRAWN AEROBIC ONLY Blood Culture adequate volume Performed at [facility] [address], [place], [place] [address]    Culture   Final    NO GROWTH 5 DAYS Performed at [facility], [place] [address], [place],  [address]    Report Status 07/16/2020 FINAL  Final  Body fluid culture  Status: None   Collection Time: 07/13/20  8:45 AM   Specimen: Joint, Right Shoulder; Synovial Fluid  Result Value Ref Range Status   Specimen Description   Final    JOINT FLUID Performed at [facility], [place] [address], [place], [place] [address]    Special Requests   Final    RIGHT SHOULDER Performed at [facility] [address] [place], [place] [address]    Gram Stain   Final    RARE WBC PRESENT, PREDOMINANTLY MONONUCLEAR NO ORGANISMS SEEN    Culture   Final    RARE PSEUDOMONAS AERUGINOSA CRITICAL RESULT CALLED TO, READ BACK BY AND VERIFIED WITH: Bernette Mayers RN, AT [phone] 07/17/20 BY D. Victoriano Lain REGARDING CULTURE GROWTH Performed at [facility], [place] [address] [place], [place] [address]    Report Status 07/17/2020 FINAL  Final   Organism ID, Bacteria  PSEUDOMONAS AERUGINOSA  Final      Susceptibility   Pseudomonas aeruginosa - MIC*    CEFTAZIDIME 2 SENSITIVE Sensitive     CIPROFLOXACIN <=0.25 SENSITIVE Sensitive     GENTAMICIN <=1 SENSITIVE Sensitive     IMIPENEM 1 SENSITIVE Sensitive     PIP/TAZO <=4 SENSITIVE Sensitive     CEFEPIME 2 SENSITIVE Sensitive     * RARE PSEUDOMONAS AERUGINOSA     Radiology Studies: No results found.   LOS: 11 days   Antonieta Pert, MD [facility]  07/20/2020, 11:12 AM

## 2020-07-21 LAB — BASIC METABOLIC PANEL
Anion gap: 8 (ref 5–15)
BUN: 9 mg/dL (ref 8–23)
CO2: 25 mmol/L (ref 22–32)
Calcium: 7.9 mg/dL — ABNORMAL LOW (ref 8.9–10.3)
Chloride: 97 mmol/L — ABNORMAL LOW (ref 98–111)
Creatinine, Ser: 0.64 mg/dL (ref 0.61–1.24)
GFR, Estimated: >60 mL/min (ref >60)
Glucose, Bld: 142 mg/dL — ABNORMAL HIGH (ref 70–99)
Potassium: 3.9 mmol/L (ref 3.5–5.1)
Sodium: 130 mmol/L — ABNORMAL LOW (ref 135–145)

## 2020-07-21 LAB — GLUCOSE, CAPILLARY
Glucose-Capillary: 120 mg/dL — ABNORMAL HIGH (ref 70–99)
Glucose-Capillary: 158 mg/dL — ABNORMAL HIGH (ref 70–99)
Glucose-Capillary: 143 mg/dL — ABNORMAL HIGH (ref 70–99)
Glucose-Capillary: 147 mg/dL — ABNORMAL HIGH (ref 70–99)
Glucose-Capillary: 166 mg/dL — ABNORMAL HIGH (ref 70–99)

## 2020-07-21 LAB — CBC
HCT: 25.6 % — ABNORMAL LOW (ref 39.0–52.0)
Hemoglobin: 8 g/dL — ABNORMAL LOW (ref 13.0–17.0)
MCH: 29 pg (ref 26.0–34.0)
MCHC: 31.3 g/dL (ref 30.0–36.0)
MCV: 92.8 fL (ref 80.0–100.0)
Platelets: 499 10*3/uL — ABNORMAL HIGH (ref 150–400)
RBC: 2.76 MIL/uL — ABNORMAL LOW (ref 4.22–5.81)
RDW: 17.2 % — ABNORMAL HIGH (ref 11.5–15.5)
WBC: 6.8 10*3/uL (ref 4.0–10.5)
nRBC: 0 % (ref 0.0–0.2)

## 2020-07-21 MED ORDER — FUROSEMIDE 20 MG PO TABS
20.0000 mg | ORAL_TABLET | Freq: Every day | ORAL | Status: DC
  Administered 2020-07-21 – 2020-07-22 (×2): 20 mg via ORAL
  Filled 2020-07-21 (×2): qty 1

## 2020-07-21 NOTE — Progress Notes (Signed)
PROGRESS NOTE    [name]  VCB:[id number] DOB: [birth year] DOA: 07/09/2020 PCP: Doreatha Lew, MD   Chief Complaint  Patient presents with  . Arm Pain  . Hypotension  Brief Narrative: 74 year old male with complex medical history with diabetes, PAD, CAD with CABG presented with weakness, fever and septic shock secondary to group B streptococcal bacteremia secondary to septic left shoulder arthritis, s/p arthroscopic I &D and washout on 07/10/20. Left septic shoulder aspirate grew group D strep on right shoulder culture grew Pseudomonas. Followed by ID and orthopedics. 12/2-significant agitation and attempting to get out of bed. 12/2-left shoulder percutaneous hematoma evacuation with placement of drain, right shoulder arthroscopic lavage and extensive debridement with placement of drain by Dr Gardiner Sleeper- PICC.  Subjective:  Right shoulder drain output decreasing, 60 mL now  From 130 ml/24 hr Afebrile, last wbc counts at 6.8k Complains of mild cough.  Sodium improved. Complains of pain on the right shoulder joint but no pain on the left.  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  Right shoulder drainage still having output, orthopedic plans to remove it once it is completely dry.  Plan is for discharge home with home health after drain is removed and okay with orthopedic.  He is already set up for IV antibiotics at home as per  infectious disease for 8-week course through January 28 with cefepime followed by Levaquin given bony changes in MRI.   Elevated troponin suspect demand ischemia from septic shock/CAD/CABG: Troponin peaked and trended down.  Seen by cardiology EKG was done without ischemic finding.  No more chest pain.  Continue.  He is also on, Coreg,imdur to 60 mg home dose.  CHF with preserved EF last echo 11/25 with EF 50 to 55%: patient meds were held due to sepsis and hypotension.  Please update weight, monitor intake output.  I will resume Lasix at 20 mg daily.  We will hold Entresto.   Hyponatremia sodium is improving.on salt tab x few doses only then stop.Trying lasix now.  PAD,Status post aortobifemoral bypass surgery/History of carotid endarterectomy 2012/HLD  Continue on aspirin, Brilinta.  He is also on PCsk9 inhibitor  BPH: Foley discontinued 12/3.  Continue Flomax.   Acute metabolic encephalopathy/delirium/sundowning in the setting of multiple factors with sepsis.  Resolved.  Anemia of chronic disease: S/p 1 unit PRBC 11/27.  Hemoglobin is stable.  Recent Labs  Lab 07/17/20 0314 07/18/20 0535 07/19/20 0812 07/20/20 0804 07/21/20 0325  HGB 8.6* 8.8* 8.2* 8.3* 8.0*  HCT 27.1* 27.9* 25.7* 25.6* 25.6*   COPD not in exacerbation continue as needed bronchodilators.  Respiratory status is stable.    Type 2 diabetes mellitus with hyperlipidemia:hbA1c 7.2 9/27 blood sugar controlled on ACHS ssi.  Patient is refusing [place]. Recent Labs  Lab 07/20/20 1651 07/20/20 2014 07/20/20 2352 07/21/20 0415 07/21/20 0739  GLUCAP 146* 187* 157* 120* 158*   Nutrition: Diet Order            Diet Heart Room service appropriate? Yes; Fluid consistency: Thin  Diet effective now  Body mass index is 27.21 kg/m. DVT prophylaxis: enoxaparin (LOVENOX) injection 40 mg Start: 07/14/20 2200 Place and maintain sequential compression device Start: 07/10/20 1229 Code Status:   Code  Status: Full Code Family Communication: plan of care discussed with patient at bedside.  Status is: Inpatient Remains inpatient appropriate because:IV treatments appropriate due to intensity of illness or inability to take PO and Inpatient level of care appropriate due to severity of illness  Dispo: The patient is from: Home              Anticipated d/c is to: SNF refused by the patient so plan for home health              Anticipated d/c date is 1- 2 days once right shoulder drainage removed and okay with orthopedic              Patient currently is not medically stable to d/c. Consultants:see note  Procedures:see note  Culture/Microbiology    Component Value Date/Time   SDES  07/13/2020 0845    JOINT FLUID Performed at [facility], [place] [address] [place], [place] [address]    SPECREQUEST  07/13/2020 0845    RIGHT SHOULDER Performed at [facility], [place] [address], [place], [place] [address]    CULT  07/13/2020 0845    RARE PSEUDOMONAS AERUGINOSA CRITICAL RESULT CALLED TO, READ BACK BY AND VERIFIED WITH: Bernette Mayers RN, AT [phone] 07/17/20 BY D. [place]iano Lain REGARDING CULTURE GROWTH Performed at [facility], [place] [name], [place], [place] [address]    REPTSTATUS 07/17/2020 FINAL 07/13/2020 0845    Other culture-see note  Medications: Scheduled Meds: . aspirin EC  81 mg Oral Daily  . buPROPion  300 mg Oral Daily  . carvedilol  3.125 mg Oral BID WC  . Chlorhexidine Gluconate Cloth  6 each Topical Daily  . cholecalciferol  5,000 Units Oral Daily  . docusate sodium  100 mg Oral BID  . enoxaparin (LOVENOX) injection  40 mg Subcutaneous Q24H  . gabapentin  400 mg Oral Daily  . gabapentin  800 mg Oral QHS  . insulin aspart  0-9 Units Subcutaneous TID WC  . isosorbide mononitrate  60 mg Oral Daily  . linagliptin  5 mg Oral Daily  . pantoprazole  40 mg Oral Daily  . rOPINIRole  1 mg Oral TID  . sodium chloride  1 g Oral BID WC  .  tamsulosin  0.4 mg Oral Daily  . ticagrelor  90 mg Oral BID   Continuous Infusions: . sodium chloride 250 mL (07/18/20 1311)  . ceFEPime (MAXIPIME) IV 2 g (07/21/20 0526)  . methocarbamol (ROBAXIN) IV 500 mg (07/18/20 1428)    Antimicrobials: Anti-infectives (From admission, onward)   Start     Dose/Rate Route Frequency Ordered Stop   07/17/20 1600  ceFAZolin (ANCEF) IVPB 2g/100 mL premix        2 g 200 mL/hr over 30 Minutes Intravenous On call to O.R. 07/17/20 1441 07/17/20 1716   07/16/20 0900  ceFEPIme (MAXIPIME) 2 g in sodium chloride 0.9 % 100 mL IVPB        2 g 200 mL/hr over 30 Minutes Intravenous Every 8 hours 07/16/20 0805     07/12/20 1800  penicillin G potassium 12 Million Units in dextrose 5 % 500 mL continuous infusion  Status:  Discontinued        12 Million Units 41.7 mL/hr over 12 Hours Intravenous Every 12 hours 07/12/20  1100 07/16/20 0805   07/11/20 1800  cefTRIAXone (ROCEPHIN) 2 g in sodium chloride 0.9 % 100 mL IVPB  Status:  Discontinued        2 g 200 mL/hr over 30 Minutes Intravenous Every 24 hours 07/11/20 1235 07/12/20 1100   07/10/20 2200  cefTRIAXone (ROCEPHIN) 2 g in sodium chloride 0.9 % 100 mL IVPB  Status:  Discontinued        2 g 200 mL/hr over 30 Minutes Intravenous Every 24 hours 07/09/20 2347 07/10/20 0926   07/10/20 1000  vancomycin (VANCOCIN) IVPB 1000 mg/200 mL premix  Status:  Discontinued        1,000 mg 200 mL/hr over 60 Minutes Intravenous Every 12 hours 07/10/20 0033 07/11/20 1235   07/10/20 1000  ceFEPIme (MAXIPIME) 2 g in sodium chloride 0.9 % 100 mL IVPB  Status:  Discontinued        2 g 200 mL/hr over 30 Minutes Intravenous Every 8 hours 07/10/20 0939 07/11/20 1235   07/09/20 2100  vancomycin (VANCOREADY) IVPB 1750 mg/350 mL        1,750 mg 175 mL/hr over 120 Minutes Intravenous STAT 07/09/20 2045 07/09/20 2333   07/09/20 2045  vancomycin (VANCOCIN) IVPB 1000 mg/200 mL premix  Status:  Discontinued        1,000 mg 200 mL/hr over 60  Minutes Intravenous  Once 07/09/20 2041 07/09/20 2045   07/09/20 2045  cefTRIAXone (ROCEPHIN) 2 g in sodium chloride 0.9 % 100 mL IVPB        2 g 200 mL/hr over 30 Minutes Intravenous  Once 07/09/20 2041 07/09/20 2209     Objective: Vitals: Today's Vitals   07/20/20 2049 07/21/20 0420 07/21/20 0600 07/21/20 0653  BP:  (!) 132/56    Pulse:  79    Resp:  18    Temp:  97.9 F (36.6 C)    TempSrc:      SpO2:  96%    Weight:      Height:      PainSc: 2   6  2      Intake/Output Summary (Last 24 hours) at 07/21/2020 0812 Last data filed at 07/21/2020 0600 Gross per 24 hour  Intake 1270 ml  Output 2810 ml  Net -1540 ml   Filed Weights   07/09/20 2017 07/10/20 0130 07/12/20 0515  Weight: 86.2 kg 92.5 kg 91 kg   Weight change:   Intake/Output from previous day: 12/05 0701 - 12/06 0700 In: 1270 [P.O.:1070; IV Piggyback:200] Out: 2810 [Urine:2750; Drains:60] Intake/Output this shift: No intake/output data recorded.  Examination: General exam: AAOx3 , NAD, weak appearing. HEENT:Oral mucosa moist, Ear/Nose WNL grossly, dentition normal. Respiratory system: bilaterally mild basal crackles,no wheezing or crackles,no use of accessory muscle Cardiovascular system: S1 & S2 +, No JVD,. Gastrointestinal system: Abdomen soft, NT,ND, BS+ Nervous System:Alert, awake, moving extremities and grossly nonfocal Extremities: No edema, distal peripheral pulses palpable.  Skin: No rashes,no icterus. MSK: Normal muscle bulk,tone, power Right shoulder with drain, dressing intact in bilateral shoulder tender both shoulders more on the right  Data Reviewed: I have personally reviewed following labs and imaging studies CBC: Recent Labs  Lab 07/17/20 0314 07/18/20 0535 07/19/20 0812 07/20/20 0804 07/21/20 0325  WBC 11.3* 10.2 9.7 8.6 6.8  NEUTROABS  --  8.9*  --   --   --   HGB 8.6* 8.8* 8.2* 8.3* 8.0*  HCT 27.1* 27.9* 25.7* 25.6* 25.6*  MCV 92.5 93.6 92.8 92.1 92.8  PLT 376 400 436*  470*  109*   Basic Metabolic Panel: Recent Labs  Lab 07/16/20 1955 07/17/20 0314 07/18/20 0535 07/19/20 0812 07/20/20 0804 07/21/20 0325  NA  --  130* 131* 127* 126* 130*  K  --  4.1 4.3 4.2 4.0 3.9  CL  --  95* 98 97* 96* 97*  CO2  --  29 23 24 25 25   GLUCOSE  --  152* 152* 165* 134* 142*  BUN  --  14 13 16 10 9   CREATININE  --  0.84 0.61 0.72 0.61 0.64  CALCIUM  --  8.2* 8.2* 7.7* 7.7* 7.9*  MG 2.0  --  2.0  --   --   --   PHOS 3.2  --  3.2  --   --   --    GFR: Estimated Creatinine Clearance: 88.9 mL/min (by C-G formula based on SCr of 0.64 mg/dL). Liver Function Tests: Recent Labs  Lab 07/17/20 0314 07/18/20 0535  AST 18 15  ALT 25 20  ALKPHOS 103 88  BILITOT 0.5 0.6  PROT 6.3* 6.0*  ALBUMIN 2.3* 2.2*   No results for input(s): LIPASE, AMYLASE in the last 168 hours. No results for input(s): AMMONIA in the last 168 hours. Coagulation Profile: No results for input(s): INR, PROTIME in the last 168 hours. Cardiac Enzymes: No results for input(s): CKTOTAL, CKMB, CKMBINDEX, TROPONINI in the last 168 hours. BNP (last 3 results) No results for input(s): PROBNP in the last 8760 hours. HbA1C: No results for input(s): HGBA1C in the last 72 hours. CBG: Recent Labs  Lab 07/20/20 1651 07/20/20 2014 07/20/20 2352 07/21/20 0415 07/21/20 0739  GLUCAP 146* 187* 157* 120* 158*   Lipid Profile: No results for input(s): CHOL, HDL, LDLCALC, TRIG, CHOLHDL, LDLDIRECT in the last 72 hours. Thyroid Function Tests: No results for input(s): TSH, T4TOTAL, FREET4, T3FREE, THYROIDAB in the last 72 hours. Anemia Panel: No results for input(s): VITAMINB12, FOLATE, FERRITIN, TIBC, IRON, RETICCTPCT in the last 72 hours. Sepsis Labs: No results for input(s): PROCALCITON, LATICACIDVEN in the last 168 hours.  Recent Results (from the past 240 hour(s))  Culture, blood (routine x 2)     Status: None   Collection Time: 07/11/20  9:55 AM   Specimen: BLOOD  Result Value Ref Range Status    Specimen Description   Final    BLOOD LEFT ANTECUBITAL Performed at [facility] [address] [place], [place] [address]    Special Requests   Final    BOTTLES DRAWN AEROBIC ONLY Blood Culture adequate volume Performed at [facility] [address] [place], [place] [address]    Culture   Final    NO GROWTH 5 DAYS Performed at [facility], [place] [address], [place], [place] [address]    Report Status 07/16/2020 FINAL  Final  Culture, blood (routine x 2)     Status: None   Collection Time: 07/11/20 10:01 AM   Specimen: BLOOD LEFT HAND  Result Value Ref Range Status   Specimen Description   Final    BLOOD LEFT HAND Performed at [facility] [address], [place], [place] [address]    Special Requests   Final    BOTTLES DRAWN AEROBIC ONLY Blood Culture adequate volume Performed at [facility] [address], [place], [place] [address]    Culture   Final    NO GROWTH 5 DAYS Performed at [facility], [place] [address] [place], [place]  [address]    Report Status 07/16/2020 FINAL  Final  Body fluid culture     Status: None   Collection Time: 07/13/20  8:45 AM   Specimen: Joint, Right Shoulder; Synovial Fluid  Result Value Ref Range Status   Specimen Description   Final    JOINT FLUID Performed at [facility], [place] [address], [place], [place] [address]    Special Requests   Final    RIGHT SHOULDER Performed at [facility] [address] [place], [place] [address]    Gram Stain   Final    RARE WBC PRESENT, PREDOMINANTLY MONONUCLEAR NO ORGANISMS SEEN    Culture   Final    RARE PSEUDOMONAS AERUGINOSA CRITICAL RESULT CALLED TO, READ BACK BY AND VERIFIED WITH: Bernette Mayers RN, AT [phone] 07/17/20 BY D. [place]iano Lain REGARDING CULTURE GROWTH Performed at [facility], [place] [address], [place], [place] [address]    Report Status 07/17/2020 FINAL  Final    Organism ID, Bacteria PSEUDOMONAS AERUGINOSA  Final      Susceptibility   Pseudomonas aeruginosa - MIC*    CEFTAZIDIME 2 SENSITIVE Sensitive     CIPROFLOXACIN <=0.25 SENSITIVE Sensitive     GENTAMICIN <=1 SENSITIVE Sensitive     IMIPENEM 1 SENSITIVE Sensitive     PIP/TAZO <=4 SENSITIVE Sensitive     CEFEPIME 2 SENSITIVE Sensitive     * RARE PSEUDOMONAS AERUGINOSA     Radiology Studies: No results found.   LOS: 12 days   Antonieta Pert, MD [facility]  07/21/2020, 8:12 AM

## 2020-07-21 NOTE — Progress Notes (Signed)
[name]  MRN: [id number] DOB/Age: Apr 28, 1946 74 y.o. [facility] Procedure: Procedure(s) (LRB): Right shoulder arthroscopic lavage; Left shoulder aspiration. (Bilateral)     Subjective: Awake and alert, appropriate on exam C/O right shoulder pain Wants to work with PT on ambulating so he can go home  Vital Signs Temp:  [97.8 F (36.6 C)-98 F (36.7 C)] 97.9 F (36.6 C) (12/06 0420) Pulse Rate:  [78-79] 79 (12/06 0420) Resp:  [18-20] 18 (12/06 0420) BP: (122-132)/(56-57) 132/56 (12/06 0420) SpO2:  [96 %-100 %] 96 % (12/06 0420)  Lab Results Recent Labs    07/20/20 0804 07/21/20 0325  WBC 8.6 6.8  HGB 8.3* 8.0*  HCT 25.6* 25.6*  PLT 470* 499*   BMET Recent Labs    07/20/20 0804 07/21/20 0325  NA 126* 130*  K 4.0 3.9  CL 96* 97*  CO2 25 25  GLUCOSE 134* 142*  BUN 10 9  CREATININE 0.61 0.64  CALCIUM 7.7* 7.9*   INR  Date Value Ref Range Status  07/09/2020 1.3 (H) 0.8 - 1.2 Final    Comment:    (NOTE) INR goal varies based on device and disease states. Performed at [facility], [place] [address] [place], [place] [address]      Exam Right shoulder JP drain with clear bloody output. Drain removed without difficulty Less swelling to right shoulder, no erythema Left shoulder still with large hemarthrosis but minimal pain. No active drainage        Plan Now that drains are out, OK to mobilize and begin his disposition planning. We will follow him along closely as outpatient We would see him back for suture removal in 10 days Cont IV antibiotics per ID I will order PT to help mobilize and when medicine feels appropriate he can DC home from our standpoint  Jenetta Loges PA-C  07/21/2020, 11:49 AM Contact # [phone]

## 2020-07-21 NOTE — TOC Progression Note (Signed)
Transition of Care [facility]) - Progression Note    Patient Details  Name: [name] MRN: [id number] Date of Birth: 09-30-1945  Transition of Care [facility]) CM/SW Contact  Josecarlos Harriott, Juliann Pulse, RN Phone Number: 07/21/2020, 9:23 AM  Clinical Narrative: AHH-rep [place]-HHRN/HHPT-ling term iv abx-[facility] aware.      Expected Discharge Plan: [place] Barriers to Discharge: Continued Medical Work up  Expected Discharge Plan and Services Expected Discharge Plan: [place]   Discharge Planning Services: CM Consult   Living arrangements for the past 2 months: Single Family Home                           HH Arranged: Refused SNF           Social Determinants of Health (SDOH) Interventions    Readmission Risk Interventions No flowsheet data found.

## 2020-07-21 NOTE — Care Management Important Message (Signed)
Important Message  Patient Details IM Letter given to the Patient Name: [name] MRN: [id number] Date of Birth: 04-19-1946   Medicare Important Message Given:  Yes     Kerin Salen 07/21/2020, 4:03 PM

## 2020-07-21 NOTE — Progress Notes (Signed)
Physical Therapy Treatment Patient Details Name: [name] MRN: [id number] DOB: [birth year] Today's Date: 07/21/2020    History of Present Illness 74 year old male presents 07/10/20 from home with generalized weakness, fever, found down by friend. Patient has chronic left shoulder problems requiring fluid drained off, by orthopedics, most recently 1 to 2 weeks ago. S/P arthroscopy and washout of Left shoulder, scheduled I&D R shoulder 12/2. 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  Pt s/p Left shoulder percutaneous hematoma evacuation with placement of drain and Right shoulder arthroscopic lavage and extensive debridement with placement of drain on 07/17/20    PT Comments    Progressing with mobility. Pt walked ~50 feet on today. He required Min assist for mobility and Min assist to don/doff shoes prior to ambulating. He is unsteady when mobilizing. He also fatigues fairly easily. Per chart, pt is refusing placement. He stated he plans to return home where he has assistance from a personal hired caregiver.     Follow Up Recommendations  SNF;Supervision/Assistance - 24 hour (pt refusing placement, so will need HHPT)     Equipment Recommendations  None recommended by PT    Recommendations for Other Services       Precautions / Restrictions Precautions Precautions: Fall Precaution Comments: painful B shoulders, rotator cuff tear/septic osteomylitis R, Left shoulder with dressing. SHOES FOR AMBULATION Restrictions Weight Bearing Restrictions: No RUE Weight Bearing: Weight bearing as tolerated LUE Weight Bearing: Weight bearing as tolerated Other Position/Activity Restrictions: able to use UEs as pain allows per ortho note    Mobility   Bed Mobility Overal bed mobility: Needs Assistance Bed Mobility: Supine to Sit;Sit to Supine     Supine to sit: Min guard;HOB elevated Sit to supine: Min guard;HOB elevated   General bed mobility comments: Increased time. Pt used bedrail with L UE.  Transfers Overall transfer level: Needs assistance   Transfers: Sit to/from Stand Sit to Stand: Min assist;From elevated surface         General transfer comment: Pt able to use bil UEs to push up from bed with some assistance. Increased time. Unsteady.  Ambulation/Gait Ambulation/Gait assistance: Min assist Gait Distance (Feet): 50 Feet Assistive device: Rolling walker (2 wheeled) Gait Pattern/deviations: Step-through pattern;Decreased stride length     General Gait Details: Assist to stabilize pt throughout distance. Unsteady. Distance limited by pain, fatigue.   Stairs             Wheelchair Mobility    Modified Rankin (Stroke Patients Only)       Balance Overall balance assessment: Needs assistance         Standing balance support: Bilateral upper extremity supported Standing balance-Leahy Scale: Poor                              Cognition Arousal/Alertness: Awake/alert Behavior During Therapy: WFL for tasks assessed/performed Overall Cognitive Status: Within Functional Limits for tasks assessed                                        Exercises      General Comments        Pertinent Vitals/Pain Pain  Assessment: Faces Faces Pain Scale: Hurts even more Pain Location: bil shoulders Pain Descriptors / Indicators: Sore;Grimacing Pain Intervention(s): Monitored during session;Limited activity within patient's tolerance;Repositioned    Home Living                      Prior Function            PT Goals (current goals can now be found in the care plan section) Progress towards PT goals: Progressing toward goals    Frequency    Min 3X/week       PT Plan Current plan remains appropriate    Co-evaluation              AM-PAC PT "6 Clicks" Mobility   Outcome Measure  Help needed turning from your back to your side while in a flat bed without using bedrails?: A Little Help needed moving from lying on your back to sitting on the side of a flat bed without using bedrails?: A Little Help needed moving to and from a bed to a chair (including a wheelchair)?: A Little Help needed standing up from a chair using your arms (e.g., wheelchair or bedside chair)?: A Little Help needed to walk in hospital room?: A Little Help needed climbing 3-5 steps with a railing? : A Lot 6 Click Score: 17    End of Session Equipment Utilized During Treatment: Gait belt Activity Tolerance: Patient tolerated treatment well Patient left: in bed;with call bell/phone within reach;with bed alarm set   PT Visit Diagnosis: Unsteadiness on feet (R26.81);Other abnormalities of gait and mobility (R26.89);Pain Pain - part of body: Shoulder (bil)     Time: 4268-3419 PT Time Calculation (min) (ACUTE ONLY): 32 min  Charges:  $Gait Training: 23-37 mins                        Doreatha Massed, PT Acute Rehabilitation  Office: [phone] Pager: [phone]

## 2020-07-22 LAB — CBC
HCT: 26.1 % — ABNORMAL LOW (ref 39.0–52.0)
Hemoglobin: 8.3 g/dL — ABNORMAL LOW (ref 13.0–17.0)
MCH: 29 pg (ref 26.0–34.0)
MCHC: 31.8 g/dL (ref 30.0–36.0)
MCV: 91.3 fL (ref 80.0–100.0)
Platelets: 599 10*3/uL — ABNORMAL HIGH (ref 150–400)
RBC: 2.86 MIL/uL — ABNORMAL LOW (ref 4.22–5.81)
RDW: 17.5 % — ABNORMAL HIGH (ref 11.5–15.5)
WBC: 7.1 10*3/uL (ref 4.0–10.5)
nRBC: 0 % (ref 0.0–0.2)

## 2020-07-22 LAB — BASIC METABOLIC PANEL
Anion gap: 7 (ref 5–15)
BUN: 11 mg/dL (ref 8–23)
CO2: 26 mmol/L (ref 22–32)
Calcium: 8.1 mg/dL — ABNORMAL LOW (ref 8.9–10.3)
Chloride: 96 mmol/L — ABNORMAL LOW (ref 98–111)
Creatinine, Ser: 0.56 mg/dL — ABNORMAL LOW (ref 0.61–1.24)
GFR, Estimated: >60 mL/min (ref >60)
Glucose, Bld: 142 mg/dL — ABNORMAL HIGH (ref 70–99)
Potassium: 3.8 mmol/L (ref 3.5–5.1)
Sodium: 129 mmol/L — ABNORMAL LOW (ref 135–145)

## 2020-07-22 LAB — GLUCOSE, CAPILLARY
Glucose-Capillary: 143 mg/dL — ABNORMAL HIGH (ref 70–99)
Glucose-Capillary: 207 mg/dL — ABNORMAL HIGH (ref 70–99)

## 2020-07-22 MED ORDER — CARVEDILOL 3.125 MG PO TABS: 3.1250 mg | ORAL_TABLET | Freq: Two times a day (BID) | ORAL | 1 refills | Status: DC

## 2020-07-22 MED ORDER — HEPARIN SOD (PORK) LOCK FLUSH 100 UNIT/ML IV SOLN
250.0000 [IU] | INTRAVENOUS | Status: AC | PRN
  Administered 2020-07-22: 250 [IU]
  Filled 2020-07-22: qty 2.5

## 2020-07-22 MED ORDER — CEFEPIME IV (FOR PTA / DISCHARGE USE ONLY): 2.0000 g | Freq: Three times a day (TID) | INTRAVENOUS | 0 refills | Status: DC

## 2020-07-22 NOTE — Discharge Summary (Signed)
Physician Discharge Summary  Deion Swift IZT:[id number] DOB: [birth year] DOA: 07/09/2020  PCP: Doreatha Lew, MD  Admit date: 07/09/2020 Discharge date: 07/22/2020  Admitted From: Home Disposition: Home with home health  Recommendations for Outpatient Follow-up:  1. Follow up with PCP in 1-2 weeks 2. Weekly labs and follow-up with ID 3. Follow-up with orthopedic for wound check and staple removal  Home Health: Yes Equipment/Devices: Yes  Discharge Condition: Stable Code Status:   Code Status: Full Code Diet recommendation:  Diet Order            Diet - low sodium heart healthy           Diet Heart Room service appropriate? Yes; Fluid consistency: Thin  Diet effective now                 Brief/Interim Summary: 74 year old male with complex medical history with diabetes, PAD, CAD with CABG presented with weakness, fever and septic shock secondary to group B streptococcal bacteremia secondary to septic left shoulder arthritis, s/p arthroscopic I &D and washout on 07/10/20. Left septic shoulder aspirate grew group D strep on right shoulder culture grew Pseudomonas. Followed by ID and orthopedics. 12/2-significant agitation and attempting to get out of bed. 12/2-left shoulder percutaneous hematoma evacuation with placement of drain, right shoulder arthroscopic lavage and extensive debridement with placement of drain by Dr Gardiner Sleeper- PICC. Patient left shoulder drain fell off, he was monitored for a few more days and subsequently right drain was removed.  He has been cleared for discharge by orthopedic who will follow up in 8 days to remove the suture continue PT OT at home continue IV antibiotics at home.He has refused to go to skilled nursing facility and he is being discharged with  home health he does have 24-hour caregiver at home.  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  Right shoulder drainage still having output, orthopedic plans to remove it once it is completely dry.  Plan is for discharge home with home health after drain is removed and okay with orthopedic.  He is already set up for IV antibiotics at home as per infectious disease for 8-week course through January 28 with cefepime followed by Levaquin given bony changes in MRI.   Elevated troponin suspect demand ischemia from septic shock/CAD/CABG: Troponin peaked and trended down.  Seen by cardiology EKG was done without ischemic finding.  No more chest pain.  Continue.  He is also on, Coreg,imdur to 60 mg home dose.  CHF with preserved EF last echo 11/25 with EF 50 to 55%: patient meds were held due to sepsis and hypotension.  Please update weight, monitor intake output.  I will resume Lasix at 20 mg daily.  We will hold Entresto.   Hyponatremia sodium is improving.on salt tab x few doses only then stop.Trying lasix now.  PAD,Status post aortobifemoral bypass surgery/History of carotid endarterectomy 2012/HLD  Continue on aspirin, Brilinta.  He is also on PCsk9 inhibitor  BPH: Foley discontinued 12/3.  Continue Flomax.   Acute metabolic encephalopathy/delirium/sundowning in the setting of multiple factors with sepsis.  Resolved.  Anemia of chronic disease: S/p  1 unit PRBC 11/27.  Hemoglobin is stable  COPD not in exacerbation continue as needed bronchodilators.  Respiratory status is stable.    Type 2 diabetes mellitus with hyperlipidemia:hbA1c 7.2 9/27 blood sugar controlled on ACHS ssi.  Patient is  refusing [place].   Consults:  ORTHO,CARDIO, PCCM  Subjective: Alert awake working with OT.  Refusing to go to skilled nursing facility. Afebrile.  Some pain in the right shoulder joint.  But he is able to mobilize.  Discharge Exam: Vitals:   07/21/20 2108 07/22/20 0636  BP: (!) 125/54 125/64  Pulse: 79 80  Resp: 20 18  Temp: 98.5 F (36.9 C) 98.4 F (36.9 C)  SpO2: 97% 95%   General: Pt is alert, awake, not in acute distress Cardiovascular: RRR, S1/S2 +, no rubs, no gallops Respiratory: CTA bilaterally, no wheezing, no rhonchi Abdominal: Soft, NT, ND, bowel sounds + Extremities: no edema, no cyanosis  Discharge Instructions  Discharge Instructions    [facility] pharmacist to adjust dose for Vancomycin, Aminoglycosides and other anti-infective therapies as requested by physician.   Complete by: As directed    [facility] to provide Cath Flo 86m   Complete by: As directed    Administer for PICC line occlusion and as ordered by physician for other access device issues.   Anaphylaxis Kit: Provided to treat any anaphylactic reaction to the medication being provided to the patient if First Dose or when requested by physician   Complete by: As directed    Epinephrine 162mml vial / amp: Administer 0.78m94m0.78ml52mubcutaneously once for moderate to severe anaphylaxis, nurse to call physician and pharmacy when reaction occurs and call 911 if needed for immediate care   Diphenhydramine 50mg45mIV vial: Administer 25-50mg 70mM PRN for first dose reaction, rash, itching, mild reaction, nurse to call physician and pharmacy when reaction occurs   Sodium Chloride 0.9% NS 500ml I78mdminister if needed for hypovolemic blood pressure drop or as ordered by physician after call to physician with anaphylactic reaction   Change dressing on IV access line weekly and PRN   Complete by: As directed    Diet - low sodium heart healthy   Complete by: As directed     Discharge instructions   Complete by: As directed    Follow-up weekly labs and fax it to the infectious disease office, follow-up with ID clinic regarding antibiotics  Please call call MD or return to ER for similar or worsening recurring problem that brought you to hospital or if any fever,nausea/vomiting,abdominal pain, uncontrolled pain, chest pain,  shortness of breath or any other alarming symptoms.  Please follow-up your doctor as instructed in a week time and call the office for appointment.  Please avoid alcohol, smoking, or any other illicit substance and maintain healthy habits including taking your regular medications as prescribed.  You were cared for by a hospitalist during your hospital stay. If you have any questions about your discharge medications or the care you received while you were in the hospital after you are discharged, you can call the unit and ask to speak with the hospitalist on call if the hospitalist that took care of you is not available.  Once you are discharged, your primary care physician will handle any further medical issues. Please note that NO REFILLS for any discharge medications will be authorized once you are discharged, as it is imperative that you return to your primary care physician (or establish a relationship with a primary  care physician if you do not have one) for your aftercare needs so that they can reassess your need for medications and monitor your lab values   Discharge wound care:   Complete by: As directed    Follow-up with orthopedic clinic For suture removal in 10 days Cont IV antibiotics per ID Dry dressing as needed.   Flush IV access with Sodium Chloride 0.9% and Heparin 10 units/ml or 100 units/ml   Complete by: As directed    Home infusion instructions - [facility]   Complete by: As directed    Instructions: Flush IV access with Sodium Chloride 0.9% and Heparin 10units/ml or 100units/ml   Change dressing on IV access  line: Weekly and PRN   Instructions Cath Flo 59m: Administer for PICC Line occlusion and as ordered by physician for other access device   [facility] pharmacist to adjust dose for: Vancomycin, Aminoglycosides and other anti-infective therapies as requested by physician   Increase activity slowly   Complete by: As directed    Method of administration may be changed at the discretion of home infusion pharmacist based upon assessment of the patient and/or caregiver's ability to self-administer the medication ordered   Complete by: As directed      Allergies as of 07/22/2020      Reactions   Ivp Dye [iodinated Diagnostic Agents]    Blood Pressure dropped----pt was pre-medicated with 13 hour prep and did fine with pre-meds--amy 03/09/11   Sulfa Antibiotics Hives   Sulfonamide Derivatives Hives, Itching   Lisinopril Cough   Metoprolol Nausea And Vomiting   Statins Other (See Comments)   myalgias   Propoxyphene N-acetaminophen Other (See Comments)   Sharp pains- headache      Medication List    TAKE these medications   Accu-Chek Aviva Plus w/Device Kit Use as directed to check blood glucose daily. Dx: E11.51   Accu-Chek FastClix Lancets Misc Use as directed to check blood glucose. Dx: E11.51   acetaminophen 500 MG tablet Commonly known as: TYLENOL Take 1,000 mg by mouth every 6 (six) hours as needed for mild pain, fever or headache.   aspirin EC 81 MG tablet Take 1 tablet (81 mg total) by mouth daily. What changed: Another medication with the same name was removed. Continue taking this medication, and follow the directions you see here. Notes to patient: Don't take more than 81 mg Aspirin daily as long as you are also on Brilinta   Brilinta 90 MG Tabs tablet Generic drug: ticagrelor Take 90 mg by mouth 2 (two) times daily.   buPROPion 300 MG 24 hr tablet Commonly known as: Wellbutrin XL Take 1 tablet (300 mg total) by mouth daily.   carvedilol 3.125 MG tablet  Commonly known as: COREG Take 1 tablet (3.125 mg total) by mouth 2 (two) times daily with a meal.   ceFEPime  IVPB Commonly known as: MAXIPIME Inject 2 g into the vein every 8 (eight) hours. Indication:  Septic arthritis/osteomyelitis  First Dose: No Last Day of Therapy:  09/12/2020 Labs - Once weekly:  CBC/D and BMP, Labs - Every other week:  ESR and CRP Method of administration: IV Push Method of administration may be changed at the discretion of home infusion pharmacist based upon assessment of the patient and/or caregiver's ability to self-administer the medication ordered.   celecoxib 200 MG capsule Commonly known as: CELEBREX TAKE 1 CAPSULE(200 MG) BY MOUTH DAILY What changed: See the new instructions.   clonazePAM 0.5 MG tablet  Commonly known as: KLONOPIN Take 1 tablet (0.5 mg total) by mouth 2 (two) times daily as needed for anxiety. What changed: when to take this   Entresto 49-51 MG Generic drug: sacubitril-valsartan Take 1 tablet by mouth 2 (two) times daily.   furosemide 20 MG tablet Commonly known as: LASIX TAKE 1 TABLET BY MOUTH TWICE DAILY What changed: when to take this   gabapentin 400 MG capsule Commonly known as: NEURONTIN 1 po q am, 1 po at lunch, 2 po qhs. What changed:   how much to take  how to take this  when to take this  additional instructions   glucose blood test strip Commonly known as: Accu-Chek Aviva Plus 1 each by Other route as needed for other. Use as instructed. Dx: E11.51   IRON PO Take 1 capsule by mouth daily.   isosorbide mononitrate 60 MG 24 hr tablet Commonly known as: IMDUR Take 60 mg by mouth daily.   Januvia 100 MG tablet Generic drug: sitaGLIPtin TAKE 1 TABLET BY MOUTH EVERY DAY What changed: how much to take   multivitamin with minerals Tabs tablet Take 1 tablet by mouth daily.   Narcan 4 MG/0.1ML Liqd nasal spray kit Generic drug: naloxone Place 4 mg into the nose as needed (opioid reversal).    nitroGLYCERIN 0.4 MG SL tablet Commonly known as: NITROSTAT PLACE 1 TABLET UNDER THE TONGUE EVERY 5 MINUTES AS NEEDED FOR CHEST PAIN What changed: See the new instructions.   omeprazole 40 MG capsule Commonly known as: PRILOSEC Take 40 mg by mouth 2 (two) times daily.   oxyCODONE-acetaminophen 10-325 MG tablet Commonly known as: PERCOCET Take 1 tablet by mouth in the morning and at bedtime.   ProAir HFA 108 (90 Base) MCG/ACT inhaler Generic drug: albuterol Inhale 2 puffs into the lungs every 6 (six) hours as needed for wheezing or shortness of breath.   rOPINIRole 1 MG tablet Commonly known as: REQUIP Take 1 tablet (1 mg total) by mouth in the morning and at bedtime.   tamsulosin 0.4 MG Caps capsule Commonly known as: FLOMAX Take 1 capsule (0.4 mg total) by mouth 2 (two) times daily. What changed: when to take this   Vitamin D3 125 MCG (5000 UT) Tabs Take 5,000 Units by mouth daily.            Durable Medical Equipment  (From admission, onward)         Start     Ordered   07/22/20 1039  For home use only DME 3 n 1  Once        07/22/20 1039           Discharge Care Instructions  (From admission, onward)         Start     Ordered   07/22/20 0000  Change dressing on IV access line weekly and PRN  (Home infusion instructions - [facility] )        07/22/20 1023   07/22/20 0000  Discharge wound care:       Comments: Follow-up with orthopedic clinic For suture removal in 10 days Cont IV antibiotics per ID Dry dressing as needed.   07/22/20 1351          Follow-up Information    [facility] Follow up in 1 week(s).   Why: s/p hospitalization due to Sepsis and NSTEMI Contact information: [address] [place] [address] [phone]        Justice Britain, MD  Follow up in 7 day(s).   Specialty: Orthopedic Surgery Why: please follow up for suture removal adn wound check Contact information: [address]  [place] [address] [phone]        Patrecia Pour, Christean Grief, MD Follow up in 1 week(s).   Specialty: Family Medicine Contact information: [place] [address] [place], [place] Follow up.   Why: HH nursing;physical therapy Contact information: [place] [address] [phone]        [facility] Follow up.   Why: iv antiiotics instruction;supplies.       [facility] Follow up.   Why: bedside commode Contact information: [place] [address] [phone]             Allergies  Allergen Reactions  . Ivp Dye [Iodinated Diagnostic Agents]     Blood Pressure dropped----pt was pre-medicated with 13 hour prep and did fine with pre-meds--amy 03/09/11   . Sulfa Antibiotics Hives  . Sulfonamide Derivatives Hives and Itching  . Lisinopril Cough  . Metoprolol Nausea And Vomiting  . Statins Other (See Comments)    myalgias  . Propoxyphene N-Acetaminophen Other (See Comments)    Sharp pains- headache    The results of significant diagnostics from this hospitalization (including imaging, microbiology, ancillary and laboratory) are listed below for reference.    Microbiology: Recent Results (from the past 240 hour(s))  Body fluid culture     Status: None   Collection Time: 07/13/20  8:45 AM   Specimen: Joint, Right Shoulder; Synovial Fluid  Result Value Ref Range Status   Specimen Description   Final    JOINT FLUID Performed at [facility], [place] [address] [place], [place] [address]    Special Requests   Final    RIGHT SHOULDER Performed at [facility] [address], [place], [place] [address]    Gram Stain   Final    RARE WBC PRESENT, PREDOMINANTLY MONONUCLEAR NO ORGANISMS SEEN    Culture   Final    RARE PSEUDOMONAS AERUGINOSA CRITICAL RESULT CALLED TO, READ BACK BY AND VERIFIED WITH: Bernette Mayers RN, AT [phone] 07/17/20 BY D.  Victoriano Lain REGARDING CULTURE GROWTH Performed at [facility], [place] [address], [place], [place] [address]    Report Status 07/17/2020 FINAL  Final   Organism ID, Bacteria PSEUDOMONAS AERUGINOSA  Final      Susceptibility   Pseudomonas aeruginosa - MIC*    CEFTAZIDIME 2 SENSITIVE Sensitive     CIPROFLOXACIN <=0.25 SENSITIVE Sensitive     GENTAMICIN <=1 SENSITIVE Sensitive     IMIPENEM 1 SENSITIVE Sensitive     PIP/TAZO <=4 SENSITIVE Sensitive     CEFEPIME 2 SENSITIVE Sensitive     * RARE PSEUDOMONAS AERUGINOSA    Procedures/Studies: DG Chest 1 View  Result Date: 07/12/2020 CLINICAL DATA:  Shortness of breath EXAM: CHEST  1 VIEW COMPARISON:  July 11, 2020 FINDINGS: No edema or airspace opacity. Heart is mildly enlarged with pulmonary vascularity normal. Patient is status post coronary artery bypass grafting. No adenopathy. There is arthropathy and postoperative change in each shoulder. Surgical clips noted in lower left neck region. There is aortic atherosclerosis. IMPRESSION: No edema or airspace opacity. Stable cardiomegaly. Status post coronary artery bypass grafting. Aortic Atherosclerosis (ICD10-I70.0). Electronically Signed   By: Lowella Grip III M.D.   On: 07/12/2020 17:30  MR SHOULDER RIGHT WO CONTRAST  Result Date: 07/15/2020 CLINICAL DATA:  Right shoulder pain. History of septic right shoulder joint. Status post washout, most recently 07/10/2020. EXAM: MRI OF THE RIGHT SHOULDER WITHOUT CONTRAST TECHNIQUE: Multiplanar, multisequence MR imaging of the right shoulder was performed. No intravenous contrast was administered. COMPARISON:  CT right shoulder 06/18/2020. FINDINGS: No sagittal T2 weighted series is provided. The patient refused additional scanning. Bones/Joint/Cartilage As on the prior CT scan, the glenoid is markedly thinned and remodeled. There is marrow edema in the subarticular glenoid and glenoid neck. Patchy marrow edema is also seen in the humeral head and  neck. Erosions in the lateral periphery of the humeral head are also again identified. There is some susceptibility artifact in the posterior greater tuberosity. The patient has a large glenohumeral joint which extravasates through massive rotator cuff tears into the subacromial/subdeltoid bursa. Structure thought to represent the superior labrum is detached from the glenoid and lying in the glenoid fossa. No normal-appearing labrum is identified. The acromioclavicular joint appears intact but there is destructive change about it and marrow edema in both the clavicular head and acromion. Ligaments Negative. Muscles and Tendons Massive rotator cuff tears are noted. There is fatty atrophy of musculature about the shoulder. A fluid collection in the subscapularis muscle belly measuring 1.6 cm transverse x 1 cm AP x 1 cm craniocaudal is identified. Fluid also dissects out of the glenohumeral joint into the infraspinatus muscle belly. Musculature about the shoulder demonstrates increased T2 signal. Soft tissues Extensive edema is seen in the axillary recess and inferior to the structure thought to represent the inferior glenohumeral ligament. Below the inferior glenohumeral ligament, there is a heterogeneous fluid collection measuring approximately 3.3 cm AP by at least 2 cm craniocaudal by 1.6 cm transverse. IMPRESSION: Very large glenohumeral joint effusion extravasating into the subacromial/subdeltoid bursa and patchy marrow edema in the humeral head and neck are consistent with a septic glenohumeral joint and osteomyelitis. Marrow edema and destructive change about the acromioclavicular joint most compatible with septic joint and osteomyelitis. Edema in rotator cuff muscles most worrisome for myositis. Small fluid collection in the subscapularis is worrisome for pyomyositis. Marked edema in the axilla. A fluid collection which appears to be inferior to the inferior glenohumeral ligament is incompletely visualized but  worrisome for soft tissue abscess. Massive rotator cuff tear. Electronically Signed   By: Inge Rise M.D.   On: 07/15/2020 16:18   IR Fluoro Guide CV Line Right  Result Date: 07/18/2020 CLINICAL DATA:  Septic arthritis of the shoulders and need for tunneled central venous catheter for long-term IV antibiotic therapy. EXAM: TUNNELED CENTRAL VENOUS CATHETER PLACEMENT WITH ULTRASOUND AND FLUOROSCOPIC GUIDANCE ANESTHESIA/SEDATION: None MEDICATIONS: None FLUOROSCOPY TIME:  54 seconds.  11.0 mGy. PROCEDURE: The procedure, risks, benefits, and alternatives were explained to the patient. Questions regarding the procedure were encouraged and answered. The patient understands and consents to the procedure. A timeout was performed prior to initiating the procedure. The right neck and chest were prepped with chlorhexidine in a sterile fashion, and a sterile drape was applied covering the operative field. Maximum barrier sterile technique with sterile gowns and gloves were used for the procedure. Local anesthesia was provided with 1% lidocaine. Ultrasound was used to confirm patency of the right internal jugular vein. After creating a small venotomy incision, a 21 gauge needle was advanced into the right internal jugular vein under direct, real-time ultrasound guidance. Ultrasound image documentation was performed. After securing guidewire access a 6 [place] peel-away  sheath was placed. A wire was kinked to measure appropriate catheter length. A 6 [place], dual-lumen power line was chosen for placement. This was tunneled in a retrograde fashion from the chest wall to the venotomy incision. The catheter was cut to 24 cm based on guidewire measurement. The catheter was then placed through the peel-away sheath and the sheath removed. Final catheter positioning was confirmed and documented with a fluoroscopic spot image. The catheter was aspirated and flushed with saline. The venotomy incision was closed with subcuticular  4-0 Vicryl. Dermabond was applied to the incision. The catheter exit site was secured with Prolene retention sutures. COMPLICATIONS: None.  No pneumothorax. FINDINGS: After catheter placement, the tip lies at the SVC/RA junction. The catheter aspirates normally and is ready for immediate use. IMPRESSION: Placement of tunneled central venous catheter via the right internal jugular vein. The catheter tip lies at the SVC/RA junction. The catheter is ready for immediate use. Electronically Signed   By: Aletta Edouard M.D.   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 EXAM: TUNNELED CENTRAL VENOUS CATHETER PLACEMENT WITH ULTRASOUND AND FLUOROSCOPIC GUIDANCE ANESTHESIA/SEDATION: None MEDICATIONS: None FLUOROSCOPY TIME:  54 seconds.  11.0 mGy. PROCEDURE: The procedure, risks, benefits, and alternatives were explained to the patient. Questions regarding the procedure were encouraged and answered. The patient understands and consents to the procedure. A timeout was performed prior to initiating the procedure. The right neck and chest were prepped with chlorhexidine in a sterile fashion, and a sterile drape was applied covering the operative field. Maximum barrier sterile technique with sterile gowns and gloves were used for the procedure. Local anesthesia was provided with 1% lidocaine. Ultrasound was used to confirm patency of the right internal jugular vein. After creating a small venotomy incision, a 21 gauge needle was advanced into the right internal jugular vein under direct, real-time ultrasound guidance. Ultrasound image documentation was performed. After securing guidewire access a 6 [place] peel-away sheath was placed. A wire was kinked to measure appropriate catheter length. A 6 [place], dual-lumen power line was chosen for placement. This was  tunneled in a retrograde fashion from the chest wall to the venotomy incision. The catheter was cut to 24 cm based on guidewire measurement. The catheter was then placed through the peel-away sheath and the sheath removed. Final catheter positioning was confirmed and documented with a fluoroscopic spot image. The catheter was aspirated and flushed with saline. The venotomy incision was closed with subcuticular 4-0 Vicryl. Dermabond was applied to the incision. The catheter exit site was secured with Prolene retention sutures. COMPLICATIONS: None.  No pneumothorax. FINDINGS: After catheter placement, the tip lies at the SVC/RA junction. The catheter aspirates normally and is ready for immediate use. IMPRESSION: Placement of tunneled central venous catheter via the right internal jugular vein. The catheter tip lies at the SVC/RA junction. The catheter is ready for immediate use. Electronically Signed   By: Aletta Edouard M.D.   On: 07/18/2020 10:17   DG CHEST PORT 1 VIEW  Result Date: 07/13/2020 CLINICAL DATA:  Respiratory failure with hypoxia. EXAM: PORTABLE CHEST 1 VIEW COMPARISON:  07/12/2020 FINDINGS: Postoperative changes in the mediastinum. Cardiac enlargement with pulmonary vascular congestion. Streaky perihilar infiltrates developing since previous study suggesting edema or possibly multifocal pneumonia. No pleural effusions. No pneumothorax. Mediastinal contours appear intact. Degenerative changes in the spine and  shoulders. IMPRESSION: Cardiac enlargement with pulmonary vascular congestion. Developing perihilar infiltrates suggesting edema or multifocal pneumonia. Electronically Signed   By: Lucienne Capers M.D.   On: 07/13/2020 07:01   DG Chest Port 1 View  Result Date: 07/11/2020 CLINICAL DATA:  Chest pain and increasing shortness of breath EXAM: PORTABLE CHEST 1 VIEW COMPARISON:  07/09/2020 FINDINGS: Postoperative changes in the mediastinum. Normal heart size. Developing linear atelectasis in  the right mid lung and lung bases. No focal consolidation. No pleural effusions. No pneumothorax. Calcification of the aorta. IMPRESSION: Developing linear atelectasis in the right mid lung and lung bases. Electronically Signed   By: Lucienne Capers M.D.   On: 07/11/2020 04:49   DG Chest Port 1 View  Result Date: 07/09/2020 CLINICAL DATA:  Questionable sepsis EXAM: PORTABLE CHEST 1 VIEW COMPARISON:  05/02/2020, 12/31/2019 FINDINGS: Post sternotomy changes. No focal opacity or pleural effusion. Stable cardiomediastinal silhouette. No pneumothorax. Mild diffuse coarse chronic interstitial opacity. IMPRESSION: No active disease. Electronically Signed   By: Donavan Foil M.D.   On: 07/09/2020 21:30   DG Shoulder Left Port  Result Date: 07/09/2020 CLINICAL DATA:  Left arm pain swelling and erythema from shoulder EXAM: LEFT SHOULDER COMPARISON:  05/12/2020, CT 06/18/2020 FINDINGS: Marked lateral soft tissue swelling over the shoulder, probably related to complex joint effusion noted on previous imaging. Irregular AC joint arthropathy. Erosive changes of the glenoid. Free-floating acromial fragment, suspect worsened erosive changes at the scapula. IMPRESSION: Marked lateral soft tissue swelling over the shoulder, probably related to complex joint effusion noted on previous imaging. Erosive changes of the glenoid and suspected worsened erosive changes at the scapula and AC joint. There appears to be a free-floating acromial fragment. Findings could be secondary to inflammatory arthropathy versus infection. Findings appear progressive as compared with prior radiograph 05/02/2020. Electronically Signed   By: Donavan Foil M.D.   On: 07/09/2020 21:39   DG FLUORO GUIDED NEEDLE PLC ASPIRATION/INJECTION LOC  Result Date: 06/24/2020 CLINICAL DATA:  Left shoulder effusion FLUOROSCOPY TIME:  0 minutes 16 seconds. 6.84 micro gray meter squared PROCEDURE: Left SHOULDER aspiration UNDER FLUOROSCOPY Skin overlying the  left shoulder was scrubbed with Betadine and draped in sterile fashion. Anesthesia was carried out using 1% lidocaine. A 20 gauge spinal needle was placed into the glenohumeral joint. 200 cc cloudy fluid were aspirated and a representative 40 cc submitted for requested studies. I massaged the soft tissues in the shoulder girdle region to help achieve total or subtotal aspiration of the regional fluid. IMPRESSION: Left shoulder aspiration yielding 200 cc of cloudy fluid. Samples submitted for requested studies. Electronically Signed   By: Nelson Chimes M.D.   On: 06/24/2020 11:32   VAS [place] ABI WITH/WO TBI  Result Date: 06/23/2020 LOWER EXTREMITY DOPPLER STUDY Indications: Claudication. High Risk Factors: Hypertension.  Vascular Interventions: Aorta-bifemoral bypass2/23/2012. Performing Technologist: Ronal Fear RVS, RCS  Examination Guidelines: A complete evaluation includes at minimum, Doppler waveform signals and systolic blood pressure reading at the level of bilateral brachial, anterior tibial, and posterior tibial arteries, when vessel segments are accessible. Bilateral testing is considered an integral part of a complete examination. Photoelectric Plethysmograph (PPG) waveforms and toe systolic pressure readings are included as required and additional duplex testing as needed. Limited examinations for reoccurring indications may be performed as noted.  ABI Findings: +---------+------------------+-----+---------+--------+ Right    Rt Pressure (mmHg)IndexWaveform Comment  +---------+------------------+-----+---------+--------+ Brachial 90                                       +---------+------------------+-----+---------+--------+  PTA      171               1.18 triphasic         +---------+------------------+-----+---------+--------+ DP       146               1.01 biphasic          +---------+------------------+-----+---------+--------+ Great Toe89                0.61                    +---------+------------------+-----+---------+--------+ +---------+------------------+-----+----------+-------+ Left     Lt Pressure (mmHg)IndexWaveform  Comment +---------+------------------+-----+----------+-------+ Brachial 145                                      +---------+------------------+-----+----------+-------+ PTA      72                0.50 monophasic        +---------+------------------+-----+----------+-------+ DP       60                0.41 monophasic        +---------+------------------+-----+----------+-------+ Great Toe67                0.46                   +---------+------------------+-----+----------+-------+ +-------+-----------+-----------+------------+------------+ ABI/TBIToday's ABIToday's TBIPrevious ABIPrevious TBI +-------+-----------+-----------+------------+------------+ Right  1.18       0.61       1.29        0.61         +-------+-----------+-----------+------------+------------+ Left   0.50       0.46       0.64        0.44         +-------+-----------+-----------+------------+------------+  Bilateral ABIs appear essentially unchanged compared to prior study on 06/21/2018.  Summary: Right: Resting right ankle-brachial index is within normal range. No evidence of significant right lower extremity arterial disease. The right toe-brachial index is abnormal. Left: Resting left ankle-brachial index indicates moderate left lower extremity arterial disease. The left toe-brachial index is abnormal.  *See table(s) above for measurements and observations.  Electronically signed by Harold Barban MD on 06/23/2020 at 12:18:17 PM.    Final    ECHOCARDIOGRAM COMPLETE  Result Date: 07/10/2020    ECHOCARDIOGRAM REPORT   Patient Name:   WALLIS VANCOTT Date of Exam: 07/10/2020 Medical Rec #:  [id number]   Height:       72.0 in Accession #:    [id number]  Weight:       203.9 lb Date of Birth:  Aug 28, 1945   BSA:          2.148 m Patient Age:     60 years    BP:           78/86 mmHg Patient Gender: M           HR:           89 bpm. Exam Location:  Inpatient Procedure: 2D Echo, Intracardiac Opacification Agent, Color Doppler and Cardiac            Doppler Indications:    Dyspnea 786.09 / R06.00  History:        Patient has prior history of Echocardiogram examinations, most  recent 09/19/2015. CAD, Prior CABG, PAD and COPD,                 Signs/Symptoms:Fever and Hypotension; Risk Factors:Diabetes,                 Hypertension and Dyslipidemia. Septic shock likely from septic                 arthritis of left shoulder.  Sonographer:    Darlina Sicilian RDCS Referring Phys: [id number] VINEET SOOD IMPRESSIONS  1. Technically difficult study with limited views  2. Left ventricular ejection fraction, by estimation, is 50 to 55%. The left ventricle has low normal function. Left ventricular diastolic parameters were normal.  3. Right ventricular systolic function is normal. The right ventricular size is mildly enlarged.  4. Left atrial size was mildly dilated.  5. The mitral valve is normal in structure. Mild mitral valve regurgitation.  6. The aortic valve is tricuspid. Aortic valve regurgitation is not visualized. Mild to moderate aortic valve sclerosis/calcification is present, without any evidence of aortic stenosis. FINDINGS  Left Ventricle: Left ventricular ejection fraction, by estimation, is 50 to 55%. The left ventricle has low normal function. The left ventricle has no regional wall motion abnormalities. Definity contrast agent was given IV to delineate the left ventricular endocardial borders. The left ventricular internal cavity size was mildly dilated. There is no left ventricular hypertrophy. Left ventricular diastolic parameters were normal. Right Ventricle: The right ventricular size is mildly enlarged. Right vetricular wall thickness was not assessed. Right ventricular systolic function is normal. Left Atrium: Left atrial size was mildly  dilated. Right Atrium: Right atrial size was not well visualized. Pericardium: There is no evidence of pericardial effusion. Mitral Valve: The mitral valve is normal in structure. Mild mitral valve regurgitation. Tricuspid Valve: The tricuspid valve is normal in structure. Tricuspid valve regurgitation is trivial. Aortic Valve: The aortic valve is tricuspid. Aortic valve regurgitation is not visualized. Mild to moderate aortic valve sclerosis/calcification is present, without any evidence of aortic stenosis. Pulmonic Valve: The pulmonic valve was not well visualized. Pulmonic valve regurgitation is not visualized. Aorta: The aortic root was not well visualized. IAS/Shunts: The interatrial septum was not well visualized.   LV Volumes (MOD) LV vol d, MOD A2C: 271.0 ml Diastology LV vol d, MOD A4C: 258.0 ml LV e' medial:    8.38 cm/s LV vol s, MOD A2C: 154.0 ml LV E/e' medial:  13.1 LV vol s, MOD A4C: 117.5 ml LV e' lateral:   18.30 cm/s LV SV MOD A2C:     117.0 ml LV E/e' lateral: 6.0 LV SV MOD A4C:     258.0 ml LV SV MOD BP:      129.3 ml RIGHT VENTRICLE TAPSE (M-mode): 2.1 cm LEFT ATRIUM             Index LA Vol (A2C):   77.5 ml 36.08 ml/m LA Vol (A4C):   85.2 ml 39.66 ml/m LA Biplane Vol: 82.6 ml 38.45 ml/m  AORTIC VALVE LVOT Vmax:   110.00 cm/s LVOT Vmean:  80.000 cm/s LVOT VTI:    0.206 m MITRAL VALVE                TRICUSPID VALVE MV Area (PHT): 5.62 cm     TR Peak grad:   22.3 mmHg MV Decel Time: 135 msec     TR Vmax:        236.00 cm/s MV E velocity: 110.00 cm/s MV A velocity: 80.00  cm/s   SHUNTS MV E/A ratio:  1.38         Systemic VTI: 0.21 m Oswaldo Milian MD Electronically signed by Oswaldo Milian MD Signature Date/Time: 07/10/2020/1:49:09 PM    Final    VAS US CAROTID  Result Date: 06/23/2020 Carotid Arterial Duplex Study Indications:       Carotid artery disease                    Left carotid endarterectomy 04/01/2011.                    Known right innominate artery stenosis. Risk  Factors:      Hypertension. Comparison Study:  06/21/2018                    R=1-39%, L=1-39% Performing Technologist: Ronal Fear RVS, RCS  Examination Guidelines: A complete evaluation includes B-mode imaging, spectral Doppler, color Doppler, and power Doppler as needed of all accessible portions of each vessel. Bilateral testing is considered an integral part of a complete examination. Limited examinations for reoccurring indications may be performed as noted.  Right Carotid Findings: +----------+--------+--------+--------+------------------+--------+           PSV cm/sEDV cm/sStenosisPlaque DescriptionComments +----------+--------+--------+--------+------------------+--------+ CCA Prox  118     12                                         +----------+--------+--------+--------+------------------+--------+ CCA Mid   154     17              heterogenous               +----------+--------+--------+--------+------------------+--------+ CCA Distal129                     calcific                   +----------+--------+--------+--------+------------------+--------+ ICA Prox  109     9       1-39%   calcific                   +----------+--------+--------+--------+------------------+--------+ ICA Mid   134     16                                         +----------+--------+--------+--------+------------------+--------+ ICA Distal170     21                                         +----------+--------+--------+--------+------------------+--------+ ECA       128                                                +----------+--------+--------+--------+------------------+--------+ +----------+--------+-------+---------+-------------------+           PSV cm/sEDV cmsDescribe Arm Pressure (mmHG) +----------+--------+-------+---------+-------------------+ Subclavian218            Turbulent                     +----------+--------+-------+---------+-------------------+ +---------+--------+--------+----------+ VertebralPSV cm/sEDV cm/sRetrograde +---------+--------+--------+----------+  Left Carotid Findings: +----------+--------+--------+--------+------------------+--------+  PSV cm/sEDV cm/sStenosisPlaque DescriptionComments +----------+--------+--------+--------+------------------+--------+ CCA Prox  100                                                +----------+--------+--------+--------+------------------+--------+ CCA Mid   100                     heterogenous               +----------+--------+--------+--------+------------------+--------+ CCA Distal120     11              heterogenous               +----------+--------+--------+--------+------------------+--------+ ICA Prox  68      9       1-39%   heterogenous               +----------+--------+--------+--------+------------------+--------+ ICA Mid   87      17                                         +----------+--------+--------+--------+------------------+--------+ ICA Distal100     18                                         +----------+--------+--------+--------+------------------+--------+ ECA       235                                                +----------+--------+--------+--------+------------------+--------+ +----------+--------+--------+----------------+-------------------+           PSV cm/sEDV cm/sDescribe        Arm Pressure (mmHG) +----------+--------+--------+----------------+-------------------+ [id number]             Multiphasic, WNL                    +----------+--------+--------+----------------+-------------------+ +---------+--------+--+--------+--+---------+ VertebralPSV cm/s96EDV cm/s10Antegrade +---------+--------+--+--------+--+---------+   Summary: Right Carotid: Velocities in the right ICA are consistent with a 1-39% stenosis. Left Carotid:  Velocities in the left ICA are consistent with a 1-39% stenosis. Vertebrals:  Left vertebral artery demonstrates antegrade flow. Right vertebral              artery demonstrates retrograde flow. Subclavians: Right subclavian artery flow was disturbed. Normal flow              hemodynamics were seen in the left subclavian artery. *See table(s) above for measurements and observations.  Electronically signed by Harold Barban MD on 06/23/2020 at 12:18:31 PM.    Final    VAS [place] LOWER EXTREMITY VENOUS (DVT)  Result Date: 07/11/2020  Lower Venous DVT Study Indications: Swelling.  Comparison Study: No prior studies. Performing Technologist: Darlin Coco, RDMS  Examination Guidelines: A complete evaluation includes B-mode imaging, spectral Doppler, color Doppler, and power Doppler as needed of all accessible portions of each vessel. Bilateral testing is considered an integral part of a complete examination. Limited examinations for reoccurring indications may be performed as noted. The reflux portion of the exam is performed with the patient in reverse Trendelenburg.  +---------+---------------+---------+-----------+----------+--------------+ RIGHT    CompressibilityPhasicitySpontaneityPropertiesThrombus Aging +---------+---------------+---------+-----------+----------+--------------+  CFV      Full           Yes      Yes                                 +---------+---------------+---------+-----------+----------+--------------+ SFJ      Full                                                        +---------+---------------+---------+-----------+----------+--------------+ FV Prox  Full                                                        +---------+---------------+---------+-----------+----------+--------------+ FV Mid   Full                                                        +---------+---------------+---------+-----------+----------+--------------+ FV DistalFull                                                         +---------+---------------+---------+-----------+----------+--------------+ PFV      Full                                                        +---------+---------------+---------+-----------+----------+--------------+ POP      Full           Yes      Yes                                 +---------+---------------+---------+-----------+----------+--------------+ PTV      Full                                                        +---------+---------------+---------+-----------+----------+--------------+ PERO     Full                                                        +---------+---------------+---------+-----------+----------+--------------+   +----+---------------+---------+-----------+----------+--------------+ LEFTCompressibilityPhasicitySpontaneityPropertiesThrombus Aging +----+---------------+---------+-----------+----------+--------------+ CFV Full           Yes      Yes                                 +----+---------------+---------+-----------+----------+--------------+  Summary: RIGHT: - There is no evidence of deep vein thrombosis in the lower extremity.  - No cystic structure found in the popliteal fossa.  LEFT: - No evidence of common femoral vein obstruction.  *See table(s) above for measurements and observations. Electronically signed by Jamelle Haring on 07/11/2020 at 2:52:37 PM.    Final    [place] EKG SITE RITE  Result Date: 07/14/2020 If Site Rite image not attached, placement could not be confirmed due to current cardiac rhythm.   Labs: BNP (last 3 results) Recent Labs    11/23/19 0724 05/02/20 1620 07/11/20 0425  BNP 362.7* 626.2* 6,270.3*   Basic Metabolic Panel: Recent Labs  Lab 07/16/20 1955 07/17/20 0314 07/18/20 0535 07/19/20 0812 07/20/20 0804 07/21/20 0325 07/22/20 0455  NA  --    < > 131* 127* 126* 130* 129*  K  --    < > 4.3 4.2 4.0 3.9 3.8  CL  --    < > 98 97* 96* 97* 96*   CO2  --    < > 23 24 25 25 26   GLUCOSE  --    < > 152* 165* 134* 142* 142*  BUN  --    < > 13 16 10 9 11   CREATININE  --    < > 0.61 0.72 0.61 0.64 0.56*  CALCIUM  --    < > 8.2* 7.7* 7.7* 7.9* 8.1*  MG 2.0  --  2.0  --   --   --   --   PHOS 3.2  --  3.2  --   --   --   --    < > = values in this interval not displayed.   Liver Function Tests: Recent Labs  Lab 07/17/20 0314 07/18/20 0535  AST 18 15  ALT 25 20  ALKPHOS 103 88  BILITOT 0.5 0.6  PROT 6.3* 6.0*  ALBUMIN 2.3* 2.2*   No results for input(s): LIPASE, AMYLASE in the last 168 hours. No results for input(s): AMMONIA in the last 168 hours. CBC: Recent Labs  Lab 07/18/20 0535 07/19/20 0812 07/20/20 0804 07/21/20 0325 07/22/20 0455  WBC 10.2 9.7 8.6 6.8 7.1  NEUTROABS 8.9*  --   --   --   --   HGB 8.8* 8.2* 8.3* 8.0* 8.3*  HCT 27.9* 25.7* 25.6* 25.6* 26.1*  MCV 93.6 92.8 92.1 92.8 91.3  PLT 400 436* 470* 499* 599*   Cardiac Enzymes: No results for input(s): CKTOTAL, CKMB, CKMBINDEX, TROPONINI in the last 168 hours. BNP: Invalid input(s): POCBNP CBG: Recent Labs  Lab 07/21/20 1127 07/21/20 1637 07/21/20 2108 07/22/20 0743 07/22/20 1203  GLUCAP 143* 147* 166* 143* 207*   D-Dimer No results for input(s): DDIMER in the last 72 hours. Hgb A1c No results for input(s): HGBA1C in the last 72 hours. Lipid Profile No results for input(s): CHOL, HDL, LDLCALC, TRIG, CHOLHDL, LDLDIRECT in the last 72 hours. Thyroid function studies No results for input(s): TSH, T4TOTAL, T3FREE, THYROIDAB in the last 72 hours.  Invalid input(s): FREET3 Anemia work up No results for input(s): VITAMINB12, FOLATE, FERRITIN, TIBC, IRON, RETICCTPCT in the last 72 hours. Urinalysis    Component Value Date/Time   COLORURINE YELLOW 07/10/2020 0100   APPEARANCEUR CLEAR 07/10/2020 0100   LABSPEC 1.015 07/10/2020 0100   PHURINE 5.0 07/10/2020 0100   GLUCOSEU NEGATIVE 07/10/2020 0100   GLUCOSEU NEGATIVE 11/15/2014 1003   HGBUR  SMALL (A) 07/10/2020 0100   BILIRUBINUR NEGATIVE 07/10/2020 0100   KETONESUR  NEGATIVE 07/10/2020 0100   PROTEINUR 30 (A) 07/10/2020 0100   UROBILINOGEN 0.2 11/15/2014 1003   NITRITE NEGATIVE 07/10/2020 0100   LEUKOCYTESUR NEGATIVE 07/10/2020 0100   Sepsis Labs Invalid input(s): PROCALCITONIN,  WBC,  LACTICIDVEN Microbiology Recent Results (from the past 240 hour(s))  Body fluid culture     Status: None   Collection Time: 07/13/20  8:45 AM   Specimen: Joint, Right Shoulder; Synovial Fluid  Result Value Ref Range Status   Specimen Description   Final    JOINT FLUID Performed at [facility], [place] [address], [place], [place] [address]    Special Requests   Final    RIGHT SHOULDER Performed at [facility] [address] [place], [place] [address]    Gram Stain   Final    RARE WBC PRESENT, PREDOMINANTLY MONONUCLEAR NO ORGANISMS SEEN    Culture   Final    RARE PSEUDOMONAS AERUGINOSA CRITICAL RESULT CALLED TO, READ BACK BY AND VERIFIED WITH: Bernette Mayers RN, AT [phone] 07/17/20 BY D. Victoriano Lain REGARDING CULTURE GROWTH Performed at [facility], [place] [address] [place], [place] [address]    Report Status 07/17/2020 FINAL  Final   Organism ID, Bacteria PSEUDOMONAS AERUGINOSA  Final      Susceptibility   Pseudomonas aeruginosa - MIC*    CEFTAZIDIME 2 SENSITIVE Sensitive     CIPROFLOXACIN <=0.25 SENSITIVE Sensitive     GENTAMICIN <=1 SENSITIVE Sensitive     IMIPENEM 1 SENSITIVE Sensitive     PIP/TAZO <=4 SENSITIVE Sensitive     CEFEPIME 2 SENSITIVE Sensitive     * RARE PSEUDOMONAS AERUGINOSA     Time coordinating discharge: 35 minutes  SIGNED: Antonieta Pert, MD  [facility] 07/22/2020, 1:51 PM  If 7PM-7AM, please contact night-coverage [email or web address]

## 2020-07-22 NOTE — Progress Notes (Signed)
Occupational Therapy Treatment Patient Details Name: [name] MRN: [id number] DOB: 12-Oct-1945 Today's Date: 07/22/2020    History of present illness 74 year old male presents 07/10/20 from home with generalized weakness, fever, found down by friend. Patient has chronic left shoulder problems requiring fluid drained off, by orthopedics, most recently 1 to 2 weeks ago. S/P arthroscopy and washout of Left shoulder, scheduled I&D R shoulder 12/2. 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  Pt s/p Left shoulder percutaneous hematoma evacuation with placement of drain and Right shoulder arthroscopic lavage and extensive debridement with placement of drain on 07/17/20   OT comments  OT treatment session with focus on self-care re-education, functional transfers, static/dynamic standing balance, d/c planning, and BUE AROM to increase independence with self-care tasks. Patient demonstrates increased independence with functional transfers and short-distance functional mobility with RW and Min guard. Per PT note, Min guard required for increased distance. Patient stood at sink level for grooming tasks with 1 seated rest break 2/2 fatigue. Patient would benefit from continued acute OT services to maximize safety and independence with self-care tasks in prep for safe d/c home. Follow-up recommendations updated to [facility] with initial 24hr supervision/assist as patient is declining SNF rehab.   Follow Up Recommendations  SNF;Other (comment);Home health OT;Supervision/Assistance - 24 hour (Patient refusing SNF)    Equipment Recommendations  3 in 1 bedside commode    Recommendations for Other Services      Precautions / Restrictions Precautions Precautions:  Fall Precaution Comments: painful B shoulders, rotator cuff tear/septic osteomylitis R, Left shoulder with dressing. SHOES FOR AMBULATION Restrictions Weight Bearing Restrictions: Yes RUE Weight Bearing: Weight bearing as tolerated LUE Weight Bearing: Weight bearing as tolerated       Mobility Bed Mobility Overal bed mobility: Needs Assistance Bed Mobility: Supine to Sit     Supine to sit: Supervision;HOB elevated        Transfers Overall transfer level: Needs assistance   Transfers: Sit to/from Stand;Stand Pivot Transfers Sit to Stand: Min guard;From elevated surface Stand pivot transfers: Min guard       General transfer comment: Min guard for sit to stand from slightly elevated EOB with cues for hand placement and Min guard for stand-pivot transfer to recliner with use of RW and 1-2 cues for safety.     Balance   Sitting-balance support: Feet supported Sitting balance-Leahy Scale: Good Sitting balance - Comments: Patient able to don footwear without LOB   Standing balance support: Bilateral upper extremity supported Standing balance-Leahy Scale: Poor                             ADL either performed or assessed with clinical judgement   ADL Overall ADL's : Needs assistance/impaired     Grooming: Sitting;Minimal assistance;Standing Grooming Details (indicate cue type and reason): Patient able to wash face and hands standing at sink level with Min guard. Min A to complete shaving tasks seated in recliner 2/2 decreased AROM.              Lower Body Dressing: Moderate assistance;Sit to/from stand Lower Body Dressing Details (indicate cue type and reason): Min A to don footwear seated EOB. Education on safe footwear to decrease risk of falls as patient leaves shoes loose  for ease donning/doffing.  Toilet Transfer: Minimal Insurance claims handler Details (indicate cue type and reason): Simulated with stand-pivot transfer to recliner.           Functional mobility during ADLs: Min guard;Minimal assistance;Rolling walker General ADL Comments: Min guard for short distance ambulation to sink surface with cues for walker management.      Vision       Perception     Praxis      Cognition Arousal/Alertness: Awake/alert Behavior During Therapy: WFL for tasks assessed/performed Overall Cognitive Status: Within Functional Limits for tasks assessed                                          Exercises Exercises: General Upper Extremity General Exercises - Upper Extremity Elbow Flexion: AROM;5 reps Wrist Flexion: AROM;5 reps Wrist Extension: AROM;5 reps Digit Composite Flexion: AROM;5 reps Composite Extension: AROM;5 reps   Shoulder Instructions       General Comments Clean/dry bandages to BUE.     Pertinent Vitals/ Pain       Pain Assessment: 0-10 Pain Score: 10-Worst pain ever Pain Location: Headache  Pain Descriptors / Indicators: Aching Pain Intervention(s): Monitored during session;Premedicated before session;Repositioned  Home Living                                          Prior Functioning/Environment              Frequency  Min 2X/week        Progress Toward Goals  OT Goals(current goals can now be found in the care plan section)  Progress towards OT goals: Progressing toward goals  Acute Rehab OT Goals Patient Stated Goal: To return home.  OT Goal Formulation: With patient Time For Goal Achievement: 07/30/20 Potential to Achieve Goals: Good ADL Goals Pt Will Perform Upper Body Dressing: with min assist;with adaptive equipment;sitting Pt Will Perform Lower Body Dressing: with min assist;with adaptive equipment;sit to/from stand;sitting/lateral leans Pt Will Transfer to Toilet: with min assist;stand pivot transfer;bedside commode Pt/caregiver will Perform Home Exercise Program: Increased ROM;Both right and left upper extremity;Independently  Plan Discharge  plan needs to be updated    Co-evaluation                 AM-PAC OT "6 Clicks" Daily Activity     Outcome Measure   Help from another person eating meals?: A Little Help from another person taking care of personal grooming?: A Little Help from another person toileting, which includes using toliet, bedpan, or urinal?: A Little Help from another person bathing (including washing, rinsing, drying)?: A Lot Help from another person to put on and taking off regular upper body clothing?: A Little Help from another person to put on and taking off regular lower body clothing?: A Lot 6 Click Score: 16    End of Session Equipment Utilized During Treatment: Gait belt;Rolling walker  OT Visit Diagnosis: Unsteadiness on feet (R26.81);Other abnormalities of gait and mobility (R26.89);Muscle weakness (generalized) (M62.81);History of falling (Z91.81);Pain Pain - Right/Left: Right Pain - part of body: Shoulder   Activity Tolerance Patient tolerated treatment well   Patient Left in chair;with call bell/phone within reach;with chair alarm set;with nursing/sitter in room   Nurse Communication Mobility status;Patient requests pain meds  Time: 0488-8916 OT Time Calculation (min): 38 min  Charges: OT General Charges $OT Visit: 1 Visit OT Treatments $Self Care/Home Management : 23-37 mins $Therapeutic Activity: 8-22 mins  Izac Faulkenberry H. OTR/L Supplemental OT, Department of rehab services [phone]   Abir Eroh R H. 07/22/2020, 9:51 AM

## 2020-07-22 NOTE — TOC Transition Note (Signed)
Transition of Care [facility]) - CM/SW Discharge Note   Patient Details  Name: Chozen Latulippe MRN: [id number] Date of Birth: Jan 10, 1946  [facility] CM/SW Contact:  Dessa Phi, RN Phone Number: 07/22/2020, 10:45 AM   Clinical Narrative:  Patient declines SNF-going home w/[facility]HC[facility] iv infusion initial teaching;med;supplies rep Pam-will make arrangements for intitial teaching w/patient, & caregiver [name] spanish speaking;No preference for dme Ro tech for 3n1 to deliver to rm prior d/c. Patient has own transport home. No further CM needs.    Final next level of care: [place] Barriers to Discharge: No Barriers Identified   Patient Goals and CMS Choice Patient states their goals for this hospitalization and ongoing recovery are:: to go home CMS Medicare.gov Compare Post Acute Care list provided to:: Patient    Discharge Placement                       Discharge Plan and Services   Discharge Planning Services: CM Consult            DME Arranged: 3-N-1 DME Agency:  ([place]) Date DME Agency Contacted: 07/22/20 Time DME Agency Contacted: 2162 Representative spoke with at DME Agency: Brenton Grills HH Arranged: RN, PT, OT, IV Antibiotics [place] Agency: [place] ([place]), [facility] Date HH Agency Contacted: 07/22/20 Time [place]: [id number] Representative spoke with at [place]: [place] ([place]) Interventions     Readmission Risk Interventions No flowsheet data found.

## 2020-07-23 ENCOUNTER — Telehealth: Payer: Self-pay | Admitting: Physician Assistant

## 2020-07-23 NOTE — Telephone Encounter (Signed)
FYI, I will look over his medication and check on refills.

## 2020-07-23 NOTE — Telephone Encounter (Signed)
Blakeslee, thanks.  All meds are ok for 1 month supply and 2 RF. Needs appt with me in the next 6 weeks or so. Please tell him I said to get well soon.  Thanks.

## 2020-07-23 NOTE — Telephone Encounter (Signed)
Pt lm stating he was just discharged from the hospital. He is requesting refills on all eligible meds stating running low. If appt is needed he stated it would have to be virtual due to feeling so lethargic.

## 2020-07-24 ENCOUNTER — Other Ambulatory Visit: Payer: Self-pay

## 2020-07-24 ENCOUNTER — Ambulatory Visit: Payer: Medicare Other | Admitting: Physician Assistant

## 2020-07-24 MED ORDER — GABAPENTIN 400 MG PO CAPS: ORAL_CAPSULE | ORAL | 0 refills | Status: DC

## 2020-07-24 MED ORDER — BUPROPION HCL ER (XL) 300 MG PO TB24: 300.0000 mg | ORAL_TABLET | Freq: Every day | ORAL | 0 refills | Status: DC

## 2020-07-24 MED ORDER — ROPINIROLE HCL 1 MG PO TABS: 1.0000 mg | ORAL_TABLET | Freq: Two times a day (BID) | ORAL | 0 refills | Status: AC

## 2020-07-24 NOTE — Telephone Encounter (Signed)
Apt 12/13

## 2020-07-25 MED ORDER — CLONAZEPAM 0.5 MG PO TABS: 0.5000 mg | ORAL_TABLET | Freq: Two times a day (BID) | ORAL | 1 refills | Status: DC | PRN

## 2020-07-28 ENCOUNTER — Encounter: Payer: Self-pay | Admitting: Infectious Disease

## 2020-07-28 ENCOUNTER — Ambulatory Visit (INDEPENDENT_AMBULATORY_CARE_PROVIDER_SITE_OTHER): Payer: No Typology Code available for payment source | Admitting: Physician Assistant

## 2020-07-28 ENCOUNTER — Other Ambulatory Visit: Payer: Self-pay

## 2020-07-28 DIAGNOSIS — F331 Major depressive disorder, recurrent, moderate: Secondary | ICD-10-CM | POA: Diagnosis not present

## 2020-07-28 DIAGNOSIS — G2581 Restless legs syndrome: Secondary | ICD-10-CM | POA: Diagnosis not present

## 2020-07-28 DIAGNOSIS — F4310 Post-traumatic stress disorder, unspecified: Secondary | ICD-10-CM | POA: Diagnosis not present

## 2020-07-28 DIAGNOSIS — F411 Generalized anxiety disorder: Secondary | ICD-10-CM | POA: Diagnosis not present

## 2020-07-28 MED ORDER — GABAPENTIN 400 MG PO CAPS: ORAL_CAPSULE | ORAL | 0 refills | Status: DC

## 2020-07-28 NOTE — Progress Notes (Signed)
[facility] Med Check  Patient ID: [name],  MRN: [id number]  PCP: Doreatha Lew, MD  Date of Evaluation: 07/28/2020 Time spent:40 minutes  Chief Complaint:  Chief Complaint    Anxiety; Depression; Follow-up      HISTORY/CURRENT STATUS: For routine med check.  Accompanied by [name], his caregiver, who doesn't speak [place].  [name] reports they communicate through Google translate.  He specifically hired her from [place] I believe, she will be here until he does not need her anymore.  She and her son live with [name].  Since his last visit he was in the hospital for about 3 weeks. Was in Septic Shock and had an MI.  "I did not know how serious it was.  I could have died."  Credit his caregiver [name] for saving his life.  She found him after he fainted and he was unresponsive.  [name] states he is doing as well as to be expected with what he has been through.  She does not speak English so we used the Googlel translate.  Due to the septicemia he will be on IV antibiotics for around a month now.  He has a PICC line.  States he gets sad due to all of his health problems, but not really depressed.  He is able to enjoy things when he gets the chance.  His physical health prevents that a lot of times though.  He used to go to [employer] or another coffee shop and sit and talk to people but since [place] and now his illness he is not able to do that anymore.  Energy and motivation are very low but again related to his physical problems.  He does not cry easily. No suicidal or homicidal thoughts.  Patient denies increased energy with decreased need for sleep, no increased talkativeness, no racing thoughts, no impulsivity or risky behaviors, no increased spending, no increased libido, no grandiosity, no increased irritability or anger.  Does not mention the FBI following him or having things go missing that he believes they have taken in the past.  No hallucinations.   RLS is well  treated. States the Klonopin continues to help that. Ropinerole does too. States the Klonopin doesn't make him dizzy or cause falls.  He's been on it for years, and we have weaned him as low as he can comfortably go in the past few years. Of course it helps with anxiety too, but he only takes it in the evening, when his 'mind won't turn off' so he can sleep.  Denies syncope, seizures, numbness, tingling, tremor, tics, unsteady gait, slurred speech, confusion. Denies muscle or joint pain, stiffness, or dystonia.  Individual Medical History/ Review of Systems: Changes? :Yes see HPI   Past medications for mental health diagnoses include:Wellbutrin XL, Risperdal, Prozac, Zoloft, Paxil, Tofranil, gabapentin, ropinirole, Klonopin  Allergies: Ivp dye [iodinated diagnostic agents], Sulfa antibiotics, Sulfonamide derivatives, Lisinopril, Metoprolol, Statins, and Propoxyphene n-acetaminophen  Current Medications:  Current Outpatient Medications:  .  ACCU-CHEK FASTCLIX LANCETS MISC, Use as directed to check blood glucose. Dx: E11.51, Disp: 102 each, Rfl: 6 .  acetaminophen (TYLENOL) 500 MG tablet, Take 1,000 mg by mouth every 6 (six) hours as needed for mild pain, fever or headache., Disp: , Rfl:  .  albuterol (VENTOLIN HFA) 108 (90 Base) MCG/ACT inhaler, Inhale 2 puffs into the lungs every 6 (six) hours as needed for wheezing or shortness of breath., Disp: , Rfl:  .  Blood Glucose Monitoring Suppl (ACCU-CHEK AVIVA PLUS) w/Device KIT,  Use as directed to check blood glucose daily. Dx: E11.51, Disp: 1 kit, Rfl: 0 .  BRILINTA 90 MG TABS tablet, Take 90 mg by mouth 2 (two) times daily., Disp: , Rfl:  .  buPROPion (WELLBUTRIN XL) 300 MG 24 hr tablet, Take 1 tablet (300 mg total) by mouth daily., Disp: 90 tablet, Rfl: 0 .  carvedilol (COREG) 3.125 MG tablet, Take 1 tablet (3.125 mg total) by mouth 2 (two) times daily with a meal., Disp: 60 tablet, Rfl: 1 .  ceFEPime (MAXIPIME) IVPB, Inject 2 g into the vein every  8 (eight) hours. Indication:  Septic arthritis/osteomyelitis  First Dose: No Last Day of Therapy:  09/12/2020 Labs - Once weekly:  CBC/D and BMP, Labs - Every other week:  ESR and CRP Method of administration: IV Push Method of administration may be changed at the discretion of home infusion pharmacist based upon assessment of the patient and/or caregiver's ability to self-administer the medication ordered., Disp: 111 Units, Rfl: 0 .  celecoxib (CELEBREX) 200 MG capsule, TAKE 1 CAPSULE(200 MG) BY MOUTH DAILY (Patient taking differently: Take 200 mg by mouth 2 (two) times daily.), Disp: 30 capsule, Rfl: 0 .  Cholecalciferol (VITAMIN D3) 5000 units TABS, Take 5,000 Units by mouth daily., Disp: , Rfl:  .  clonazePAM (KLONOPIN) 0.5 MG tablet, Take 1 tablet (0.5 mg total) by mouth 2 (two) times daily as needed for anxiety., Disp: 60 tablet, Rfl: 1 .  ENTRESTO 49-51 MG, Take 1 tablet by mouth 2 (two) times daily., Disp: , Rfl:  .  furosemide (LASIX) 20 MG tablet, TAKE 1 TABLET BY MOUTH TWICE DAILY (Patient taking differently: Take 20 mg by mouth 2 (two) times daily.), Disp: 180 tablet, Rfl: 1 .  glucose blood (ACCU-CHEK AVIVA PLUS) test strip, 1 each by Other route as needed for other. Use as instructed. Dx: E11.51, Disp: 100 each, Rfl: 6 .  IRON PO, Take 1 capsule by mouth daily., Disp: , Rfl:  .  isosorbide mononitrate (IMDUR) 60 MG 24 hr tablet, Take 60 mg by mouth daily., Disp: , Rfl:  .  JANUVIA 100 MG tablet, TAKE 1 TABLET BY MOUTH EVERY DAY (Patient taking differently: Take 100 mg by mouth daily.), Disp: 90 tablet, Rfl: 1 .  Multiple Vitamin (MULTIVITAMIN WITH MINERALS) TABS tablet, Take 1 tablet by mouth daily., Disp: , Rfl:  .  nitroGLYCERIN (NITROSTAT) 0.4 MG SL tablet, PLACE 1 TABLET UNDER THE TONGUE EVERY 5 MINUTES AS NEEDED FOR CHEST PAIN (Patient taking differently: Place 0.4 mg under the tongue every 5 (five) minutes as needed for chest pain.), Disp: 25 tablet, Rfl: 2 .  omeprazole (PRILOSEC) 40  MG capsule, Take 40 mg by mouth 2 (two) times daily., Disp: , Rfl:  .  rOPINIRole (REQUIP) 1 MG tablet, Take 1 tablet (1 mg total) by mouth in the morning and at bedtime., Disp: 180 tablet, Rfl: 0 .  aspirin EC 81 MG tablet, Take 1 tablet (81 mg total) by mouth daily. (Patient not taking: Reported on 07/28/2020), Disp: 30 tablet, Rfl: 0 .  gabapentin (NEURONTIN) 400 MG capsule, 1 po q am, 1 po at lunch, 2 po qhs., Disp: 360 capsule, Rfl: 0 .  NARCAN 4 MG/0.1ML LIQD nasal spray kit, Place 4 mg into the nose as needed (opioid reversal).  (Patient not taking: Reported on 07/28/2020), Disp: , Rfl:  .  oxyCODONE-acetaminophen (PERCOCET) 10-325 MG tablet, Take 1 tablet by mouth in the morning and at bedtime.  (Patient not taking: Reported on  07/28/2020), Disp: , Rfl:  .  tamsulosin (FLOMAX) 0.4 MG CAPS capsule, Take 1 capsule (0.4 mg total) by mouth 2 (two) times daily. (Patient not taking: Reported on 07/28/2020), Disp: 180 capsule, Rfl: 0 Medication Side Effects: none  Family Medical/ Social History: Changes? No  MENTAL HEALTH EXAM:  There were no vitals taken for this visit.There is no height or weight on file to calculate BMI.  General Appearance: Casual, Neat and Well Groomed  Eye Contact:  Good  Speech:  Clear and Coherent and Normal Rate  Volume:  Normal  Mood:  Euthymic  Affect:  Appropriate  Thought Process:  Goal Directed and Descriptions of Associations: Intact  Orientation:  Full (Time, Place, and Person)  Thought Content: Logical   Suicidal Thoughts:  No  Homicidal Thoughts:  No  Memory:  WNL  Judgement:  Good  Insight:  Fair  Psychomotor Activity:  slow, walks with a walker  Concentration:  Concentration: Good  Recall:  Good  Fund of Knowledge: Good  Language: Good  Assets:  Desire for Improvement  ADL's:  Intact  Cognition: WNL  Prognosis:  Fair    DIAGNOSES:    ICD-10-CM   1. Major depressive disorder, recurrent episode, moderate (HCC)  F33.1   2. Generalized  anxiety disorder  F41.1   3. RLS (restless legs syndrome)  G25.81   4. PTSD (post-traumatic stress disorder)  F43.10     Receiving Psychotherapy: No    RECOMMENDATIONS:  PDMP reviewed. I provided 40 minutes of face to face time during this encounter including time spent reviewing his chart and the recent hospitalization for septic shock. Long disc about BZ use in elderly. He understands the risks of falls, dizziness, decreased cognition, but is willing to accept those risks. Since he is not living alone, I am more comfortable Rx it. It is more helpful than harmful for him. When we decreased to a lower dose in the past, he seemed to become more paranoia.  I continue to recommend an antipsychotic but he refuses to take.  Since he got out of the hospital, he is having a little bit more trouble with restless legs.  We will continue to monitor.  He was stable up until the hospitalization, and I believe it is worse due to the immobility. Continue Wellbutrin XL 300 mg, 1 p.o. daily. Continue Klonopin 0.5 mg, 1 p.o. twice daily as needed.  He only takes 2 po qhs.  Continue gabapentin 400 mg, 1 p.o. every morning, add 1 around lunch, and 2 p.o. nightly. Continue ropinirole 1 mg, 1 p.o. bid. Return in 2 months.   Donnal Moat, PA-C

## 2020-07-31 ENCOUNTER — Telehealth: Payer: Self-pay

## 2020-07-31 NOTE — Telephone Encounter (Signed)
Attempted to call patient, no answer.   RN spoke with Joaquim Lai, advised her that Dr. [name] feels ortho should evaluate the patient's shoulder. Joaquim Lai states she will call Dr. Susie Cassette office to make them aware of the situation before the patient's appointment with them tomorrow.   Beryle Flock, RN

## 2020-07-31 NOTE — Telephone Encounter (Signed)
Sounds like Dr. Onnie Graham will  hafve to take4 him back tot he OR

## 2020-07-31 NOTE — Telephone Encounter (Signed)
Received call from [facility], physical therapist, she states that the patient's left shoulder joint has significant "fluid-like" swelling. She states the redness down his left arm has not progressed and the patient does not report an increase in pain. Patient does however report an increase in fatigue. No fevers present. [name] states concern for his blood pressure, saying that it is "down significantly" from the last reading. Today, patient's blood pressure was 105/60, the reading prior to that was 120/78. Patient is scheduled to see ortho tomorrow. Will route to provider.   Beryle Flock, RN

## 2020-08-01 ENCOUNTER — Encounter (HOSPITAL_COMMUNITY): Payer: Self-pay

## 2020-08-01 ENCOUNTER — Other Ambulatory Visit: Payer: Self-pay

## 2020-08-01 ENCOUNTER — Emergency Department (HOSPITAL_COMMUNITY)
Admission: EM | Admit: 2020-08-01 | Discharge: 2020-08-02 | Disposition: A | Discharge status: Home / Self Care | Payer: Medicare Other | Attending: Emergency Medicine | Admitting: Emergency Medicine

## 2020-08-01 DIAGNOSIS — Z79899 Other long term (current) drug therapy: Secondary | ICD-10-CM | POA: Insufficient documentation

## 2020-08-01 DIAGNOSIS — Z955 Presence of coronary angioplasty implant and graft: Secondary | ICD-10-CM | POA: Insufficient documentation

## 2020-08-01 DIAGNOSIS — Z7982 Long term (current) use of aspirin: Secondary | ICD-10-CM | POA: Insufficient documentation

## 2020-08-01 DIAGNOSIS — I25110 Atherosclerotic heart disease of native coronary artery with unstable angina pectoris: Secondary | ICD-10-CM | POA: Diagnosis not present

## 2020-08-01 DIAGNOSIS — E11621 Type 2 diabetes mellitus with foot ulcer: Secondary | ICD-10-CM | POA: Diagnosis not present

## 2020-08-01 DIAGNOSIS — E785 Hyperlipidemia, unspecified: Secondary | ICD-10-CM | POA: Diagnosis not present

## 2020-08-01 DIAGNOSIS — Z87891 Personal history of nicotine dependence: Secondary | ICD-10-CM | POA: Insufficient documentation

## 2020-08-01 DIAGNOSIS — E1142 Type 2 diabetes mellitus with diabetic polyneuropathy: Secondary | ICD-10-CM | POA: Insufficient documentation

## 2020-08-01 DIAGNOSIS — M25511 Pain in right shoulder: Secondary | ICD-10-CM | POA: Diagnosis not present

## 2020-08-01 DIAGNOSIS — M009 Pyogenic arthritis, unspecified: Secondary | ICD-10-CM | POA: Diagnosis not present

## 2020-08-01 DIAGNOSIS — R799 Abnormal finding of blood chemistry, unspecified: Secondary | ICD-10-CM | POA: Insufficient documentation

## 2020-08-01 DIAGNOSIS — J449 Chronic obstructive pulmonary disease, unspecified: Secondary | ICD-10-CM | POA: Diagnosis not present

## 2020-08-01 DIAGNOSIS — E1151 Type 2 diabetes mellitus with diabetic peripheral angiopathy without gangrene: Secondary | ICD-10-CM | POA: Insufficient documentation

## 2020-08-01 DIAGNOSIS — Z951 Presence of aortocoronary bypass graft: Secondary | ICD-10-CM | POA: Insufficient documentation

## 2020-08-01 DIAGNOSIS — L97509 Non-pressure chronic ulcer of other part of unspecified foot with unspecified severity: Secondary | ICD-10-CM | POA: Insufficient documentation

## 2020-08-01 DIAGNOSIS — E1169 Type 2 diabetes mellitus with other specified complication: Secondary | ICD-10-CM | POA: Diagnosis not present

## 2020-08-01 DIAGNOSIS — E1140 Type 2 diabetes mellitus with diabetic neuropathy, unspecified: Secondary | ICD-10-CM | POA: Diagnosis not present

## 2020-08-01 DIAGNOSIS — M25512 Pain in left shoulder: Secondary | ICD-10-CM | POA: Diagnosis not present

## 2020-08-01 LAB — URINALYSIS, ROUTINE W REFLEX MICROSCOPIC
Bacteria, UA: NONE SEEN
Bilirubin Urine: NEGATIVE
Glucose, UA: NEGATIVE mg/dL
Hgb urine dipstick: NEGATIVE
Ketones, ur: NEGATIVE mg/dL
Leukocytes,Ua: NEGATIVE
Nitrite: NEGATIVE
Protein, ur: 30 mg/dL — AB
Specific Gravity, Urine: 1.009 (ref 1.005–1.030)
pH: 6 (ref 5.0–8.0)

## 2020-08-01 LAB — COMPREHENSIVE METABOLIC PANEL
ALT: 26 U/L (ref 0–44)
AST: 31 U/L (ref 15–41)
Albumin: 2.6 g/dL — ABNORMAL LOW (ref 3.5–5.0)
Alkaline Phosphatase: 172 U/L — ABNORMAL HIGH (ref 38–126)
Anion gap: 9 (ref 5–15)
BUN: 26 mg/dL — ABNORMAL HIGH (ref 8–23)
CO2: 22 mmol/L (ref 22–32)
Calcium: 8.6 mg/dL — ABNORMAL LOW (ref 8.9–10.3)
Chloride: 96 mmol/L — ABNORMAL LOW (ref 98–111)
Creatinine, Ser: 1.1 mg/dL (ref 0.61–1.24)
GFR, Estimated: >60 mL/min (ref >60)
Glucose, Bld: 155 mg/dL — ABNORMAL HIGH (ref 70–99)
Potassium: 4.6 mmol/L (ref 3.5–5.1)
Sodium: 127 mmol/L — ABNORMAL LOW (ref 135–145)
Total Bilirubin: 0.3 mg/dL (ref 0.3–1.2)
Total Protein: 7.6 g/dL (ref 6.5–8.1)

## 2020-08-01 LAB — CBC WITH DIFFERENTIAL/PLATELET
Abs Immature Granulocytes: 0.07 10*3/uL (ref 0.00–0.07)
Basophils Absolute: 0.1 10*3/uL (ref 0.0–0.1)
Basophils Relative: 1 %
Eosinophils Absolute: 0.5 10*3/uL (ref 0.0–0.5)
Eosinophils Relative: 9 %
HCT: 28.1 % — ABNORMAL LOW (ref 39.0–52.0)
Hemoglobin: 8.9 g/dL — ABNORMAL LOW (ref 13.0–17.0)
Immature Granulocytes: 1 %
Lymphocytes Relative: 17 %
Lymphs Abs: 1 10*3/uL (ref 0.7–4.0)
MCH: 28.6 pg (ref 26.0–34.0)
MCHC: 31.7 g/dL (ref 30.0–36.0)
MCV: 90.4 fL (ref 80.0–100.0)
Monocytes Absolute: 0.7 10*3/uL (ref 0.1–1.0)
Monocytes Relative: 11 %
Neutro Abs: 3.7 10*3/uL (ref 1.7–7.7)
Neutrophils Relative %: 61 %
Platelets: 385 10*3/uL (ref 150–400)
RBC: 3.11 MIL/uL — ABNORMAL LOW (ref 4.22–5.81)
RDW: 17.2 % — ABNORMAL HIGH (ref 11.5–15.5)
WBC: 6 10*3/uL (ref 4.0–10.5)
nRBC: 0 % (ref 0.0–0.2)

## 2020-08-01 LAB — LACTIC ACID, PLASMA: Lactic Acid, Venous: 0.8 mmol/L (ref 0.5–1.9)

## 2020-08-01 MED ORDER — FENTANYL CITRATE (PF) 100 MCG/2ML IJ SOLN
50.0000 ug | Freq: Once | INTRAMUSCULAR | Status: AC
  Administered 2020-08-01: 21:00:00 50 ug via INTRAVENOUS
  Filled 2020-08-01: qty 2

## 2020-08-01 MED ORDER — SODIUM CHLORIDE 0.9 % IV BOLUS (SEPSIS)
1000.0000 mL | Freq: Once | INTRAVENOUS | Status: AC
  Administered 2020-08-01: 23:00:00 1000 mL via INTRAVENOUS

## 2020-08-01 MED ORDER — MORPHINE SULFATE (PF) 4 MG/ML IV SOLN: 4.0000 mg | Freq: Once | INTRAVENOUS | Status: DC

## 2020-08-01 MED ORDER — SODIUM CHLORIDE 0.9 % IV SOLN: 1000.0000 mL | INTRAVENOUS | Status: DC

## 2020-08-01 MED ORDER — OXYCODONE-ACETAMINOPHEN 5-325 MG PO TABS: 1.0000 | ORAL_TABLET | Freq: Once | ORAL | Status: DC

## 2020-08-01 MED ORDER — SODIUM CHLORIDE 0.9 % IV SOLN
2.0000 g | Freq: Once | INTRAVENOUS | Status: AC
  Administered 2020-08-01: 23:00:00 2 g via INTRAVENOUS
  Filled 2020-08-01: qty 2

## 2020-08-01 NOTE — ED Notes (Signed)
Attempted to call Vladimir Faster (patients friend) [phone], no answer. Left a message for her to pick him up.

## 2020-08-01 NOTE — ED Provider Notes (Signed)
Please see PA [facility] & Attending Dr. Ellsworth Lennox note for full H&P/care of this patient.   Briefly patient is a 74 year old male who was sent to the emergency department from [facility] after being seen by Dr. Onnie Graham in clinic today.  Per patient he was sent to the emergency department as they were concerned he was ill in clinic.  Other than his shoulder pain which is not significantly changed patient has no other significant complaints. He was recently admitted to the hospital for septic shoulders bilaterally, underwent surgical intervention, is currently on outpatient IV antibiotics. States that the orthopedist took fluid off his shoulders on each side and he has this with him in the ED.   I called and spoke with orthopedic surgeon Dr. Stann Mainland who is on-call for [facility] for more information, he has reviewed the office note from patient's orthopedic visit today, there was concern for his overall medical condition/health which is what prompted his ED visit, not his shoulder findings, he relays that from an orthopedic standpoint he did not appear to require hospital admission.  If ED team felt medically appropriate to discharge the patient home, okay to discharge with synovial fluid cell count/Gram stain/culture pending, orthopedics he will follow up on this outpatient.     Leafy Kindle 08/01/20 2213    Milton Ferguson, MD 08/01/20 2217

## 2020-08-01 NOTE — ED Provider Notes (Signed)
[facility] DEPT Provider Note   CSN: [id number] Arrival date & time: 08/01/20  1813     History Chief Complaint  Patient presents with  . Abnormal Lab    [name] is a 74 y.o. male was recently discharged from the hospital on 07/22/20.  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  Patient had multiple shoulder aspirations, arthroscopic I&D on 07/06/20.  Currently has a PICC line in his right chest is receiving cefepime 2 g three times daily.  Patient reports thatt he had fluid drawn off of both shoulders today by Dr. Onnie Graham. He states Dr. Onnie Graham gave him the collected fluid samples, told him to come to the hospital because he looked too sick to go home.  His only complaint at this time is bilateral shoulder pain, patient rates the pain as a 10/10, is constant, worse with movement.  Patient has 2 bags with tubes of no view of fluid from his aspiration today.  Patient denies any fevers, chills, neck stiffness, numbness or tingling in his arms, weakness in his arms.    HPI     Past Medical History:  Diagnosis Date  . Anemia    NOS  . Anxiety   . 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 BPH (benign prostatic hypertrophy)   . Broken foot Oct. 12, 2013   Right foot Fx  . CAD (coronary artery disease)   . Carotid stenosis, left   . Cellulitis and abscess of leg 03/2018   right leg  . Chronic hyponatremia   . Chronic pain syndrome   . COPD (chronic obstructive pulmonary disease) ([place])   . Depression   . Diabetes mellitus   . Diabetic foot ulcer ([place])   . DJD (degenerative joint disease)    L wrist  . DM2 (diabetes mellitus, type 2) ([place])   . GERD (gastroesophageal reflux disease)   . HLD (hyperlipidemia)   . HTN (hypertension)   . Hx of blood transfusion reaction   . Hyponatremia   . Lumbar disc disease   .  MI (mitral incompetence)   . Myocardial infarction ([place])    x 2  . Osteoporosis    tx per [facility], Dr Prudencio Burly, thought due to heavy steriod use after 1978  . Peripheral neuropathy   . Peripheral vascular disease ([place])   . Restless leg syndrome   . Spine fracture    hx, multiple  . Varicose veins   . 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 Vitamin D deficiency     Patient Active Problem List   Diagnosis Date Noted  . Sepsis due to group B Streptococcus ([place])   . Pyogenic arthritis of left shoulder region ([place])   . Acute pain of right shoulder   . Acute respiratory failure ([place]) 07/12/2020  . Shortness of breath   . Status post aortobifemoral bypass surgery   . History of carotid endarterectomy   . Type 2 diabetes mellitus with hyperlipidemia ([place])   . Diabetes mellitus type II, non insulin dependent ([place])   . Septic shock ([place]) 07/09/2020  . Pressure injury of skin 05/12/2020  . Amputation of right middle finger 05/12/2020  . Long term (current) use of opiate analgesic 05/07/2020  . Pain in left foot 05/07/2020  . Neck pain 05/07/2020  . Chronic systolic congestive heart failure, NYHA  class 3 ([place]) 01/08/2020  . Constipation 01/07/2020  . CAD (coronary artery disease) 01/03/2020  . Hyperlipidemia associated with type 2 diabetes mellitus ([place]) 01/03/2020  . S/P coronary artery stent placement 01/03/2020  . Major depressive disorder, recurrent episode, severe, with psychotic behavior ([place]) 12/29/2019  . Delusional disorder ([place]) 12/29/2019  . NSTEMI (non-ST elevated myocardial infarction) ([place]) 05/19/2019  . MRSA infection 01/18/2019  . Occipital neuralgia 01/18/2019  . Osteomyelitis of left foot ([place]) 01/18/2019  . Type 2 diabetes mellitus with diabetic peripheral angiopathy without gangrene, without long-term current use of insulin ([place]) 01/18/2019  . Acute on chronic diastolic congestive heart failure ([place]) 01/15/2019  . Varus foot deformity, acquired, left  11/28/2018  . Generalized anxiety disorder 07/03/2018  . Restless leg syndrome 07/03/2018  . Paranoia ([place]) 07/03/2018  . PTSD (post-traumatic stress disorder) 05/29/2018  . Abscess of right lower leg   . Severe protein-calorie malnutrition ([place])   . 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 ([place])   . Diabetic polyneuropathy associated with type 2 diabetes mellitus ([place])   . Cellulitis 04/01/2018  . Cellulitis of right lower extremity 04/01/2018  . Tachycardia 04/01/2018  . Hx of CABG 09/22/2015  . Unstable angina ([place]) 09/20/2015  . Chest pain, rule out acute myocardial infarction 09/18/2015  . Diabetes mellitus type 2 with peripheral artery disease ([place]) 09/18/2015  . Hyponatremia 09/18/2015  . Chest pain on exertion 09/18/2015  . Varicose veins of bilateral lower extremities with other complications 41/28/7867  . Occlusion and stenosis of carotid artery without mention of cerebral infarction 10/18/2011  . SINUSITIS, ACUTE 03/13/2010  . BACK PAIN 11/13/2009  . FATIGUE 10/17/2009  . Diabetic neuropathy associated with diabetes mellitus due to underlying condition ([place]) 08/29/2009  . Vitamin D deficiency 08/29/2009  . Hyposmolality and/or hyponatremia 08/29/2009  . VISUAL ACUITY, DECREASED, LEFT EYE 08/29/2009  . Peripheral artery disease ([place]) 08/29/2009  . SINUSITIS, CHRONIC 08/29/2009  . NEPHROLITHIASIS 08/29/2009  . FIBROMYALGIA 08/29/2009  . BICEPS TENDON RUPTURE, RIGHT 08/11/2009  . DIABETES MELLITUS, TYPE II 08/05/2009  . Hyperlipidemia 08/05/2009  . ANEMIA-NOS 08/05/2009  . ANXIETY 08/05/2009  . DEPRESSION 08/05/2009  . Chronic pain syndrome 08/05/2009  . Essential hypertension 08/05/2009  . GERD 08/05/2009  . BENIGN PROSTATIC HYPERTROPHY 08/05/2009  . [place] DISEASE, LUMBAR 08/05/2009  . Osteoporosis 08/05/2009    Past Surgical History:  Procedure Laterality Date  . ANGIOPLASTY    . aorta bifemoral bypass grafting  09/2010  .  CARDIAC CATHETERIZATION N/A 09/19/2015   Procedure: Left Heart Cath and Coronary Angiography;  Surgeon: Adrian Prows, MD;  Location: [facility];  Service: Cardiovascular;  Laterality: N/A;  . CARPAL TUNNEL RELEASE     right 2006/ left 2007  . CATARACT EXTRACTION     left 1996/ right 1997  . CORONARY ARTERY BYPASS GRAFT N/A 09/22/2015   Procedure: CORONARY ARTERY BYPASS GRAFTING (CABG) times four using the right greater saphenous vein harvested endoscopically and the left internal mammary artery.  LIMA-LAD, SEQ SVG-DIAG & OM, SVG-PD.;  Surgeon: [name], MD;  Location: [facility];  Service: Open Heart Surgery;  Laterality: N/A;  . I & D EXTREMITY Right 04/07/2018   Procedure: IRRIGATION AND DEBRIDEMENT ABSCESS RIGHT LEG;  Surgeon: Newt Minion, MD;  Location: [facility];  Service: Orthopedics;  Laterality: Right;  . INCISION AND DRAINAGE OF WOUND Right 05/07/2020   Procedure: RIGHT LONG FINGER IRRIGATION AND DEBRIDEMENT AND AMPUTATION;  Surgeon: Iran Planas, MD;  [facility];  Service: Orthopedics;  Laterality: Right;  with IV sedation  . INCISION AND DRAINAGE OF WOUND Right 05/12/2020   Procedure: IRRIGATION AND DEBRIDEMENT WOUND and possible revision amputation;  Surgeon: Iran Planas, MD;  Location: [facility];  Service: Orthopedics;  Laterality: Right;  needs 60 minutes  . IR FLUORO GUIDE CV LINE RIGHT  07/18/2020  . IR US GUIDE VASC ACCESS RIGHT  07/18/2020  . L foot open repair jones fracture  2010    5th metetarsal   . left ankle ganglion cyst  1976  . left carotid endarterectomy  03/2011  . left cataract  1996   right - 1997  . left CTS  2006   R CTS - 2007  . left foot surgery  1998   R surgery/fracture - 1999  . left plantar ganglion cystectomy  1979  . left wrist/hand fusion  2008  . [place], 2004  . NASAL SINUS SURGERY     multiple- x8. last 1997 with obliteration  . REPAIR THORACIC AORTA  2017  . right hand fracture  1969  . ROTATOR CUFF REPAIR  2006    R, than repeat 2011, Dr Theda Sers  . SHOULDER ARTHROSCOPY Bilateral 07/17/2020   Procedure: Right shoulder arthroscopic lavage; Left shoulder aspiration.;  Surgeon: Justice Britain, MD;  Location: [facility];  Service: Orthopedics;  Laterality: Bilateral;  164mn  . SHOULDER ARTHROSCOPY WITH LABRAL REPAIR Left 07/10/2020   Procedure: SHOULDER ARTHROSCOPY WITH WASHOUT;  Surgeon: SJustice Britain MD;  Location: [facility];  Service: Orthopedics;  Laterality: Left;  . TRIGGER FINGER RELEASE Left 11/11/2014   Procedure: LEFT LONG FINGER RELEASE TRIGGER FINGER/A-1 PULLEY;  Surgeon: DMilly Jakob MD;  [facility]  Service: Orthopedics;  Laterality: Left;  . vocal surgery  1996       Family History  Problem Relation Age of Onset  . Lung cancer Father 582 . Diabetes Mother 759 . Osteoporosis Mother   . Depression Mother   . Bleeding Disorder Mother   . Throat cancer Other        family hx - "bad living" - also lung CA, heart disease and renal failure    . Diabetes Sister   . Suicidality Son 483 . Lung cancer Maternal Grandmother 585 . Diabetes Maternal Grandfather 630 . Heart disease Maternal Grandfather   . Heart attack Paternal Grandmother   . Brain cancer Paternal Grandfather 759   Social History   Tobacco Use  . Smoking status: Former Smoker    Packs/day: 1.50    Years: 30.00    Pack years: 45.00    Types: Cigarettes    Quit date: 11/06/1988    Years since quitting: 31.7  . Smokeless tobacco: Never Used  Vaping Use  . Vaping Use: Never used  Substance Use Topics  . Alcohol use: Not Currently    Comment: None in 30 years but drank beer before- 05/09/20  . Drug use: No    Home Medications Prior to Admission medications   Medication Sig Start Date End Date Taking? Authorizing Provider  ACCU-CHEK FASTCLIX LANCETS MISC Use as directed to check blood glucose. Dx: E11.51 01/17/18   ELauree Chandler NP  acetaminophen (TYLENOL) 500 MG tablet Take 1,000 mg by mouth  every 6 (six) hours as needed for mild pain, fever or headache.    [provider]  albuterol (VENTOLIN HFA) 108 (90 Base) MCG/ACT inhaler Inhale 2 puffs into the lungs  every 6 (six) hours as needed for wheezing or shortness of breath.    [provider]  aspirin EC 81 MG tablet Take 1 tablet (81 mg total) by mouth daily. Patient not taking: Reported on 07/28/2020 07/15/20   Spero Geralds, MD  Blood Glucose Monitoring Suppl (ACCU-CHEK AVIVA PLUS) w/Device KIT Use as directed to check blood glucose daily. Dx: E11.51 01/17/18   [name], NP  BRILINTA 90 MG TABS tablet Take 90 mg by mouth 2 (two) times daily. 11/21/19   [provider]  buPROPion (WELLBUTRIN XL) 300 MG 24 hr tablet Take 1 tablet (300 mg total) by mouth daily. 07/24/20   Donnal Moat T, PA-C  carvedilol (COREG) 3.125 MG tablet Take 1 tablet (3.125 mg total) by mouth 2 (two) times daily with a meal. 07/22/20 09/20/20  Antonieta Pert, MD  ceFEPime (MAXIPIME) IVPB Inject 2 g into the vein every 8 (eight) hours. Indication:  Septic arthritis/osteomyelitis  First Dose: No Last Day of Therapy:  09/12/2020 Labs - Once weekly:  CBC/D and BMP, Labs - Every other week:  ESR and CRP Method of administration: IV Push Method of administration may be changed at the discretion of home infusion pharmacist based upon assessment of the patient and/or caregiver's ability to self-administer the medication ordered. 07/22/20 08/28/20  Antonieta Pert, MD  celecoxib (CELEBREX) 200 MG capsule TAKE 1 CAPSULE(200 MG) BY MOUTH DAILY Patient taking differently: Take 200 mg by mouth 2 (two) times daily. 10/02/18   [name], NP  Cholecalciferol (VITAMIN D3) 5000 units TABS Take 5,000 Units by mouth daily.    [provider]  clonazePAM (KLONOPIN) 0.5 MG tablet Take 1 tablet (0.5 mg total) by mouth 2 (two) times daily as needed for anxiety. 07/25/20   Hurst, Teresa T, PA-C  ENTRESTO 49-51 MG Take 1 tablet by mouth 2 (two) times  daily. 12/11/19   [provider]  furosemide (LASIX) 20 MG tablet TAKE 1 TABLET BY MOUTH TWICE DAILY Patient taking differently: Take 20 mg by mouth 2 (two) times daily. 07/17/19   Adrian Prows, MD  gabapentin (NEURONTIN) 400 MG capsule 1 po q am, 1 po at lunch, 2 po qhs. 07/28/20   Hurst, Teresa T, PA-C  glucose blood (ACCU-CHEK AVIVA PLUS) test strip 1 each by Other route as needed for other. Use as instructed. Dx: E11.51 01/17/18   [name], NP  IRON PO Take 1 capsule by mouth daily.    [provider]  isosorbide mononitrate (IMDUR) 60 MG 24 hr tablet Take 60 mg by mouth daily. 12/11/19   [provider]  JANUVIA 100 MG tablet TAKE 1 TABLET BY MOUTH EVERY DAY Patient taking differently: Take 100 mg by mouth daily. 05/19/18   [name], DO  Multiple Vitamin (MULTIVITAMIN WITH MINERALS) TABS tablet Take 1 tablet by mouth daily.    [provider]  NARCAN 4 MG/0.1ML LIQD nasal spray kit Place 4 mg into the nose as needed (opioid reversal).  Patient not taking: Reported on 07/28/2020 06/18/19   [provider]  nitroGLYCERIN (NITROSTAT) 0.4 MG SL tablet PLACE 1 TABLET UNDER THE TONGUE EVERY 5 MINUTES AS NEEDED FOR CHEST PAIN Patient taking differently: Place 0.4 mg under the tongue every 5 (five) minutes as needed for chest pain. 07/17/19   Adrian Prows, MD  omeprazole (PRILOSEC) 40 MG capsule Take 40 mg by mouth 2 (two) times daily. 11/26/19   [provider]  oxyCODONE-acetaminophen (PERCOCET) 10-325 MG tablet Take  1 tablet by mouth in the morning and at bedtime.  Patient not taking: Reported on 07/28/2020 12/24/19   [provider]  rOPINIRole (REQUIP) 1 MG tablet Take 1 tablet (1 mg total) by mouth in the morning and at bedtime. 07/24/20   Addison Lank, PA-C  tamsulosin (FLOMAX) 0.4 MG CAPS capsule Take 1 capsule (0.4 mg total) by mouth 2 (two) times daily. Patient not taking: Reported on 07/28/2020 11/16/18   [name],  NP    Allergies    Ivp dye [iodinated diagnostic agents], Sulfa antibiotics, Sulfonamide derivatives, Lisinopril, Metoprolol, Statins, and Propoxyphene n-acetaminophen  Review of Systems   Review of Systems  Constitutional: Negative for chills and fever.  Eyes: Negative for visual disturbance.  Respiratory: Negative for shortness of breath.   Cardiovascular: Negative for chest pain.  Gastrointestinal: Negative for abdominal pain, nausea and vomiting.  Genitourinary: Negative for difficulty urinating and dysuria.  Musculoskeletal: Positive for arthralgias, joint swelling and myalgias. Negative for back pain, neck pain and neck stiffness.  Skin: Positive for color change (erythema to right shoulder ). Negative for rash.  Neurological: Negative for dizziness, syncope, light-headedness and headaches.  Psychiatric/Behavioral: Negative for confusion.    Physical Exam Updated Vital Signs BP 103/62   Pulse 64   Temp 97.6 F (36.4 C) (Rectal)   Resp 12   Ht 6' (1.829 m)   Wt 88.5 kg   SpO2 100%   BMI 26.45 kg/m   Physical Exam Vitals and nursing note reviewed.  Constitutional:      General: He is not in acute distress.    Appearance: He is not ill-appearing, toxic-appearing or diaphoretic.  HENT:     Head: Normocephalic.  Eyes:     Pupils: Pupils are equal, round, and reactive to light.  Cardiovascular:     Rate and Rhythm: Normal rate and regular rhythm.     Pulses:          Radial pulses are 3+ on the right side and 3+ on the left side.     Heart sounds: Normal heart sounds.  Pulmonary:     Effort: Pulmonary effort is normal.     Breath sounds: Normal breath sounds.  Abdominal:     General: There is no distension.     Palpations: Abdomen is soft.     Tenderness: There is no abdominal tenderness.  Musculoskeletal:     Right shoulder: Swelling, tenderness and bony tenderness present. Decreased range of motion.     Left shoulder: Swelling, tenderness and bony tenderness  present. Decreased range of motion.     Right upper arm: No swelling, deformity, tenderness or bony tenderness.     Left upper arm: No swelling, deformity, tenderness or bony tenderness.     Right elbow: No swelling or deformity. Normal range of motion. No tenderness.     Left elbow: No swelling or deformity. Normal range of motion. No tenderness.     Right forearm: No swelling, deformity, tenderness or bony tenderness.     Left forearm: No swelling, deformity, tenderness or bony tenderness.     Cervical back: Neck supple. No rigidity or bony tenderness. No spinous process tenderness.     Comments: Surgical incisions noted to bilateral shoulders, dry and intact, no purulent drainage  Decreased range of motion to bilateral shoulders, worse on external rotation,flexion, extension and abduction; unclear if this is due to pain or true decreased range of motion.  Patient has tenderness to bilateral trapezius muscles  Skin:  General: Skin is warm and dry.     Findings: Bruising (Bilateral shoulders) and erythema (left shoulder medially down left upper arm) present.  Neurological:     General: No focal deficit present.     Mental Status: He is alert.  Psychiatric:        Behavior: Behavior is cooperative.     ED Results / Procedures / Treatments   Labs (all labs ordered are listed, but only abnormal results are displayed) Labs Reviewed  COMPREHENSIVE METABOLIC PANEL - Abnormal; Notable for the following components:      Result Value   Sodium 127 (*)    Chloride 96 (*)    Glucose, Bld 155 (*)    BUN 26 (*)    Calcium 8.6 (*)    Albumin 2.6 (*)    Alkaline Phosphatase 172 (*)    All other components within normal limits  CBC WITH DIFFERENTIAL/PLATELET - Abnormal; Notable for the following components:   RBC 3.11 (*)    Hemoglobin 8.9 (*)    HCT 28.1 (*)    RDW 17.2 (*)    All other components within normal limits  URINALYSIS, ROUTINE W REFLEX MICROSCOPIC - Abnormal; Notable for  the following components:   Protein, ur 30 (*)    All other components within normal limits  BODY FLUID CULTURE  BODY FLUID CULTURE  LACTIC ACID, PLASMA  LACTIC ACID, PLASMA  SYNOVIAL CELL COUNT + DIFF, W/ CRYSTALS  SYNOVIAL CELL COUNT + DIFF, W/ CRYSTALS    EKG None  Radiology No results found.  Procedures Procedures (including critical care time)  Medications Ordered in ED Medications  sodium chloride 0.9 % bolus 1,000 mL (1,000 mLs Intravenous New Bag/Given 08/01/20 2248)    Followed by  0.9 %  sodium chloride infusion (has no administration in time range)  fentaNYL (SUBLIMAZE) injection 50 mcg (50 mcg Intravenous Given 08/01/20 2057)  ceFEPIme (MAXIPIME) 2 g in sodium chloride 0.9 % 100 mL IVPB (2 g Intravenous New Bag/Given 08/01/20 2249)    ED Course  I have reviewed the triage vital signs and the nursing notes.  Pertinent labs & imaging results that were available during my care of the patient were reviewed by me and considered in my medical decision making (see chart for details).    MDM Rules/Calculators/A&P                          Alert 74 year old male in no acute distress, nontoxic appearing.  S. Petrucelli, PA-C contacted  orthopedic surgeon Dr. Stann Mainland who is on-call for [facility] for more information, he has reviewed the office note from patient's orthopedic visit today, there was concern for his overall medical condition/health which is what prompted his ED visit, not his shoulder findings, he relays that from an orthopedic standpoint he did not appear to require hospital admission.  If ED team felt medically appropriate to discharge the patient home, okay to discharge with synovial fluid cell count/Gram stain/culture pending, orthopedics he will follow up on this outpatient.  Patient was given fentanyl for his pain, fluid bolus as he appears dehydrated, and 2 g of cefepime has not had his last dose for the day.   CBC showed decreased RBC, hemoglobin,  hematocrit however these values are improved from labs collected 10 days prior.  CMP showed sodium 127, BUN 26, alk phos of 172, likely this is secondary to dehydration.  Lactic acid 0.8.    Patient remains afebrile, vitals  are stable, labs largely unremarkable, EKG shows sinus rhythm with PVC.  Overall patient is medically appropriate for discharge, have patient follow-up primary care provider and orthopedics.  Discussed results, findings, treatment and follow up. Patient advised of return precautions. Patient verbalized understanding and agreed with plan.  Patient was discussed with and evaluated by Dr. Roderic Palau.    Final Clinical Impression(s) / ED Diagnoses Final diagnoses:  Bilateral shoulder pain, unspecified chronicity    Rx / DC Orders ED Discharge Orders    None       Dyann Ruddle 08/01/20 2358    Milton Ferguson, MD 08/02/20 [phone]

## 2020-08-01 NOTE — Discharge Instructions (Addendum)
You came to the emergency department today for your bilateral shoulder pain and concerns for infection.  We spoke with a physician form from [facility] and reported that you did not need admission department orthopedic standpoint.  Medically there is no need to admit you as you are labs are reassuring.  You did appear dehydrated and were given IV fluids.  Please make sure to stay well-hydrated by drinking water and/or watered-down sports drinks.    Please take Ibuprofen (Advil, motrin) and Tylenol (acetaminophen) to relieve your pain.  You may take up to 600 MG (3 pills) of normal strength ibuprofen every 8 hours as needed.  In between doses of ibuprofen you make take tylenol, up to 1,000 mg (two extra strength pills).  Do not take more than 3,000 mg tylenol in a 24 hour period.  Please check all medication labels as many medications such as pain and cold medications may contain tylenol.  Do not drink alcohol while taking these medications.  Do not take other NSAID'S while taking ibuprofen (such as aleve or naproxen).  Please take ibuprofen with food to decrease stomach upset.  Please follow-up with your orthopedic doctor Dr.Supple. If you have any other concerns you can also follow up with your primary care provider. If your symptoms worsen please return to the emergency department.  Get help right away if: Your arm, hand, or fingers: Tingle. Become numb. Become swollen. Become painful. Turn white or blue.

## 2020-08-01 NOTE — ED Triage Notes (Signed)
Patient was seen by an Ortho today. patient had fluid drawn from both shoulders. Patient states he was told to come to the ED for "a blood infection." Patient arrived with tubes of fluid in a lab bag and was told to give the ED the tubes of fluid.  Patient c/o bilateral shoulder pain.

## 2020-08-02 LAB — SYNOVIAL CELL COUNT + DIFF, W/ CRYSTALS
Crystals, Fluid: NONE SEEN
Crystals, Fluid: NONE SEEN
Eosinophils-Synovial: 1 % (ref 0–1)
Eosinophils-Synovial: 0 % (ref 0–1)
Lymphocytes-Synovial Fld: 1 % (ref 0–20)
Lymphocytes-Synovial Fld: 0 % (ref 0–20)
Monocyte-Macrophage-Synovial Fluid: 1 % — ABNORMAL LOW (ref 50–90)
Monocyte-Macrophage-Synovial Fluid: 1 % — ABNORMAL LOW (ref 50–90)
Neutrophil, Synovial: 97 % — ABNORMAL HIGH (ref 0–25)
Neutrophil, Synovial: 99 % — ABNORMAL HIGH (ref 0–25)
Other Cells-SYN: 0
Other Cells-SYN: 0
WBC, Synovial: 38200 /mm3 — ABNORMAL HIGH (ref 0–200)
WBC, Synovial: 51000 /mm3 — ABNORMAL HIGH (ref 0–200)

## 2020-08-03 ENCOUNTER — Encounter: Payer: Self-pay | Admitting: Physician Assistant

## 2020-08-05 LAB — BODY FLUID CULTURE
Culture: NO GROWTH
Culture: NO GROWTH

## 2020-08-06 ENCOUNTER — Other Ambulatory Visit: Payer: Self-pay | Admitting: Physician Assistant

## 2020-08-06 ENCOUNTER — Telehealth: Payer: Self-pay | Admitting: Physician Assistant

## 2020-08-06 MED ORDER — ARIPIPRAZOLE 2 MG PO TABS: ORAL_TABLET | ORAL | 1 refills | Status: DC

## 2020-08-06 NOTE — Telephone Encounter (Signed)
[name] called saying he is in bad shape.  He needs something to help with his depression.  It is getting worse and he is getting in a bad place.  Requests a call ASAP.  Next appt is 09/30/20.  [facility] in [place]. Here by [place].

## 2020-08-06 NOTE — Telephone Encounter (Signed)
I returned [name]'s call.  He is not having suicidal thoughts but does say he has just about giving up.  He is very depressed.  He has been in the hospital several times this year and the last time was septic shock.  He requests something for depression and to help with energy and motivation.  I recommend Abilify, added onto Wellbutrin.  He has always refused an antipsychotic, which he is really needed for his paranoia.  At this time I can prescribe the antipsychotic as an antidepressant, which is an appropriate adjunct treatment.  We discussed the benefits, risks, and side effects and he accepts.  Prescription was sent to the [facility] noted above.

## 2020-08-07 ENCOUNTER — Telehealth: Payer: Self-pay

## 2020-08-07 NOTE — Telephone Encounter (Signed)
Received call from [place], patient's home health aide. Called her back with use of [employer] [phone]). [name] is concerned because patient requires assistance with ADL's. Stated that he isn't able to walk well, is reportedly incontinent, has poor memory and won't be able to infuse himself even with teaching. Explained to her that our office is responsible for the antibiotic management and that in order for the patient to receive proper care this weekend, he might need to be re-evaluated in the ED. Home health isn't available to infusion assistance for every dose.   Patient refused SNF placement at discharge. Left VM for patient to return call to discuss concerns.

## 2020-08-07 NOTE — Telephone Encounter (Signed)
Attempted to call patient again to discuss weekend plans however was unable to connect. Left voicemail with previous attempt.  Kymere Fullington Lorita Officer, RN

## 2020-08-07 NOTE — Telephone Encounter (Addendum)
Patient called with concerns about his antibiotic regimen. Reports that his home health aide will be unavailable for the holiday weekend and he doesn't know how to do the infusions. Called [facility] to see if there was someone available to provide supplies and teaching in the home today. Awaiting call back.  [name] Lorita Officer, RN

## 2020-08-07 NOTE — Telephone Encounter (Addendum)
Contacted [facility] to request additional home health teaching for abx infusions. [phone] ext [phone]). They stated they would have someone out there today with supplies to re-educate the patient on infusion.   Patient expressed concern about being able to do it however RN reiterated that home health would not be able to come daily to infuse and that he would be responsible for the infusions over the weekend if his aide is unavailable. Explained that the home health team would train him and provide supplies. Patient verbalized understanding.   Stefanee Mckell Lorita Officer, RN

## 2020-08-11 NOTE — Telephone Encounter (Addendum)
Spoke with patient. Patient gave his own antibiotics this weekend, but dropped several supplies. He states he got all antibiotic doses infused over the weekend. His home health nurse is coming this morning to change his PICC dressing and draw labs today.  He is starting to feel better, but still drowsy and with low energy.  He is home and has a Economist he hired from [place] to take care of him. Mary at [place] spoke with [facility]. Patient's aide was only gone one day, [facility] said patient did well with his infusion over the weekend. Landis Gandy, RN

## 2020-08-20 ENCOUNTER — Telehealth: Payer: Self-pay | Admitting: Physician Assistant

## 2020-08-20 ENCOUNTER — Telehealth: Payer: Self-pay

## 2020-08-20 NOTE — Telephone Encounter (Signed)
[facility] has called re: [name]. They are filling his Klonopin RX and wanted to 1) get the diagnosis for Klonopin and 2) verify that provider knows he is also getting oxycodone- acetominophen 10/325mg  #60 per 30 days. Please call with confirmation. [phone] [facility]

## 2020-08-20 NOTE — Telephone Encounter (Signed)
[facility] calling to alert of abnormal lab:  Creatine 2.13  Drawn on Aug 18, 2020.  Laverle Patter, RN

## 2020-08-20 NOTE — Telephone Encounter (Signed)
I returned call to this number and got a voicemail of a compliance specialist at [facility].  I left a detailed message stating that I am aware that he is on the Klonopin as well as oxycodone.  Both the patient's pain management specialist as well as myself are aware of this combination.  He has been on this for several years.  Patient is aware of increased risk for falling with any benzodiazepine and he accepts that risk.  He has been on Klonopin for years and we have weaned as low as he is able to tolerate.

## 2020-08-21 ENCOUNTER — Other Ambulatory Visit: Payer: Self-pay

## 2020-08-21 ENCOUNTER — Ambulatory Visit (INDEPENDENT_AMBULATORY_CARE_PROVIDER_SITE_OTHER): Payer: No Typology Code available for payment source | Admitting: Infectious Disease

## 2020-08-21 ENCOUNTER — Telehealth: Payer: Self-pay

## 2020-08-21 ENCOUNTER — Encounter: Payer: Self-pay | Admitting: Infectious Disease

## 2020-08-21 VITALS — BP 121/62 | HR 76

## 2020-08-21 DIAGNOSIS — A401 Sepsis due to streptococcus, group B: Secondary | ICD-10-CM | POA: Diagnosis not present

## 2020-08-21 DIAGNOSIS — N179 Acute kidney failure, unspecified: Secondary | ICD-10-CM | POA: Insufficient documentation

## 2020-08-21 DIAGNOSIS — A498 Other bacterial infections of unspecified site: Secondary | ICD-10-CM

## 2020-08-21 DIAGNOSIS — A419 Sepsis, unspecified organism: Secondary | ICD-10-CM

## 2020-08-21 DIAGNOSIS — M00212 Other streptococcal arthritis, left shoulder: Secondary | ICD-10-CM | POA: Diagnosis not present

## 2020-08-21 DIAGNOSIS — E0849 Diabetes mellitus due to underlying condition with other diabetic neurological complication: Secondary | ICD-10-CM

## 2020-08-21 DIAGNOSIS — R6521 Severe sepsis with septic shock: Secondary | ICD-10-CM

## 2020-08-21 DIAGNOSIS — I509 Heart failure, unspecified: Secondary | ICD-10-CM

## 2020-08-21 HISTORY — DX: Acute kidney failure, unspecified: N17.9

## 2020-08-21 NOTE — Telephone Encounter (Signed)
Bed control contacted for direct admit. Patient sent home due to long wait time and educated to await a call from admitting for placement.   Admitting provider - Lorin Mercy

## 2020-08-21 NOTE — Telephone Encounter (Signed)
I have his labs in my office.   12/27 SCr 1.22; 1/3 SCr 2.13 1/3 ESR 70; CRP 15  Not sure what is causing his kidney issues. It shouldn't be cefepime.

## 2020-08-21 NOTE — Telephone Encounter (Signed)
I plano on admitting him if he comes to clinic. If not he is going to need to go through er or [facility]'s clinic for admission he needs surgery

## 2020-08-21 NOTE — Telephone Encounter (Signed)
Dr. [name] -patient seeing you today at 1030.

## 2020-08-21 NOTE — Progress Notes (Signed)
Subjective:  Chief complaint: Bilateral shoulder pain  Patient ID: [name], male    DOB: 02/11/1946, 75 y.o.   MRN: [id number]  HPI  75 y.o. male with Group B streptococcal bacteremia and septic shock due to left septic shoulder sp I and D.  His right shoulder  Was also infected. He then grew Pseudomonas growing from an aspirate.   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  He is now status post surgery to the right side and repeat aspirate of the left which revealed hematoma.  He has been on cefepime after the Pseudomonas was recovered.  He has been on antibiotics since November 24 when he was admitted with sepsis.  He was sent home on cefepime.  In the interim he was seen by Dr. Onnie Graham who is concerned about his appearance and felt he was not well enough to go home and sent him to the emergency department.  In the interim he had aspirated both shoulders and sent the patient with the shoulder aspirate sent to the emergency department.  Aspirate the 1 shoulder revealed 38,200 white blood cells with 97% PMNs aspirate the other shoulder revealed 4 51,000 white blood cells with 99% neutrophils.  There were no crystals Gram stain was negative and the cultures were negative--KEEP IN MIND he has been on ACTIVE antbiotics for weeks.  Dr. Stann Mainland was called about the patient when he was in the ER and reviewed his laboratory findings and felt that there was no orthopedic intervention but that the patient might need further medical optimization in the hospital.  Ultimately patient was discharged him home from the ER.  I am concerned that he in fact DOES need further intervention on both shoulders.  He presented to clinic for follow-up today.  Early his morning we were called by [facility] who informed us his kidney function has worsened with his creatinine going from 1.2-2 2.13.  They have dose adjusted his  cefepime.  He currently has hired a [place] woman who is administering his antibiotics to him at home and who accompanied him to the visit.  She only speaks Spanish but uses a translation app when talking to him or to [place].  The patient himself still has pain bilaterally he says that his left side had some intense erythema streaking down it abruptly a week or 2 ago.  His range of motion is severely limited bilaterally he does have tenderness there is not exquisite in both shoulders.     Past Medical History:  Diagnosis Date  . Acute renal failure (ARF) ([place]) 08/21/2020  . Anemia    NOS  . Anxiety   . 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 BPH (benign prostatic hypertrophy)   . Broken foot Oct. 12, 2013   Right foot Fx  . CAD (coronary artery disease)   . Carotid stenosis, left   . Cellulitis and abscess of leg 03/2018   right leg  . Chronic hyponatremia   . Chronic pain syndrome   . COPD (chronic obstructive pulmonary disease) ([place])   . Depression   . Diabetes mellitus   . Diabetic foot ulcer ([place])   . DJD (degenerative joint disease)    L wrist  . DM2 (diabetes mellitus, type 2) ([place])   . GERD (gastroesophageal reflux disease)   . HLD (hyperlipidemia)   . HTN (hypertension)   .  Hx of blood transfusion reaction   . Hyponatremia   . Lumbar disc disease   . MI (mitral incompetence)   . Myocardial infarction ([place])    x 2  . Osteoporosis    tx per [facility], Dr Prudencio Burly, thought due to heavy steriod use after 1978  . Peripheral neuropathy   . Peripheral vascular disease ([place])   . Restless leg syndrome   . Spine fracture    hx, multiple  . Varicose veins   . 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 Vitamin D deficiency     Past Surgical History:  Procedure Laterality Date  . ANGIOPLASTY    . aorta bifemoral bypass grafting  09/2010  . CARDIAC CATHETERIZATION N/A 09/19/2015   Procedure: Left Heart Cath and Coronary Angiography;  Surgeon:  Adrian Prows, MD;  Location: [facility];  Service: Cardiovascular;  Laterality: N/A;  . CARPAL TUNNEL RELEASE     right 2006/ left 2007  . CATARACT EXTRACTION     left 1996/ right 1997  . CORONARY ARTERY BYPASS GRAFT N/A 09/22/2015   Procedure: CORONARY ARTERY BYPASS GRAFTING (CABG) times four using the right greater saphenous vein harvested endoscopically and the left internal mammary artery.  LIMA-LAD, SEQ SVG-DIAG & OM, SVG-PD.;  Surgeon: [name], MD;  Location: [facility];  Service: Open Heart Surgery;  Laterality: N/A;  . I & D EXTREMITY Right 04/07/2018   Procedure: IRRIGATION AND DEBRIDEMENT ABSCESS RIGHT LEG;  Surgeon: Newt Minion, MD;  Location: [facility];  Service: Orthopedics;  Laterality: Right;  . INCISION AND DRAINAGE OF WOUND Right 05/07/2020   Procedure: RIGHT LONG FINGER IRRIGATION AND DEBRIDEMENT AND AMPUTATION;  Surgeon: Iran Planas, MD;  Location: [facility];  Service: Orthopedics;  Laterality: Right;  with IV sedation  . INCISION AND DRAINAGE OF WOUND Right 05/12/2020   Procedure: IRRIGATION AND DEBRIDEMENT WOUND and possible revision amputation;  Surgeon: Iran Planas, MD;  Location: [facility];  Service: Orthopedics;  Laterality: Right;  needs 60 minutes  . IR FLUORO GUIDE CV LINE RIGHT  07/18/2020  . IR US GUIDE VASC ACCESS RIGHT  07/18/2020  . L foot open repair jones fracture  2010    5th metetarsal   . left ankle ganglion cyst  1976  . left carotid endarterectomy  03/2011  . left cataract  1996   right - 1997  . left CTS  2006   R CTS - 2007  . left foot surgery  1998   R surgery/fracture - 1999  . left plantar ganglion cystectomy  1979  . left wrist/hand fusion  2008  . [place], 2004  . NASAL SINUS SURGERY     multiple- x8. last 1997 with obliteration  . REPAIR THORACIC AORTA  2017  . right hand fracture  1969  . ROTATOR CUFF REPAIR  2006   R, than repeat 2011, Dr Theda Sers  . SHOULDER ARTHROSCOPY Bilateral 07/17/2020   Procedure: Right  shoulder arthroscopic lavage; Left shoulder aspiration.;  Surgeon: Justice Britain, MD;  [facility];  Service: Orthopedics;  Laterality: Bilateral;  184mn  . SHOULDER ARTHROSCOPY WITH LABRAL REPAIR Left 07/10/2020   Procedure: SHOULDER ARTHROSCOPY WITH WASHOUT;  Surgeon: SJustice Britain MD;  [facility];  Service: Orthopedics;  Laterality: Left;  . TRIGGER FINGER RELEASE Left 11/11/2014   Procedure: LEFT LONG FINGER RELEASE TRIGGER FINGER/A-1 PULLEY;  Surgeon: DMilly Jakob MD;  [facility]  Service: Orthopedics;  Laterality: Left;  . vocal surgery  1996    Family History  Problem Relation Age of Onset  . Lung cancer Father 4  . Diabetes Mother 27  . Osteoporosis Mother   . Depression Mother   . Bleeding Disorder Mother   . Throat cancer Other        family hx - "bad living" - also lung CA, heart disease and renal failure    . Diabetes Sister   . Suicidality Son 13  . Lung cancer Maternal Grandmother 68  . Diabetes Maternal Grandfather 20  . Heart disease Maternal Grandfather   . Heart attack Paternal Grandmother   . Brain cancer Paternal Grandfather 104      Social History   Socioeconomic History  . Marital status: Divorced    Spouse name: Not on file  . Number of children: Not on file  . Years of education: Not on file  . Highest education level: Not on file  Occupational History  . Not on file  Tobacco Use  . Smoking status: Former Smoker    Packs/day: 1.50    Years: 30.00    Pack years: 45.00    Types: Cigarettes    Quit date: 11/06/1988    Years since quitting: 31.8  . Smokeless tobacco: Never Used  Vaping Use  . Vaping Use: Never used  Substance and Sexual Activity  . Alcohol use: Not Currently    Comment: None in 30 years but drank beer before- 05/09/20  . Drug use: No  . Sexual activity: Not Currently  Other Topics Concern  . Not on file  Social History Narrative   Social History      Diet? I eat about what I want.        Do you drink/eat things with caffeine? yes      Marital status?                  divorced                  What year were you married? 1966 to 2007      Do you live in a house, apartment, assisted living, condo, trailer, etc.? [place]      Is it one or more stories? one      How many persons live in your home? Me only      Do you have any pets in your home? (please list) no      Highest level of education completed? Finished high school      Current or past profession: Clinical biochemist in [place] you exercise?               no                       Type & how often?      Advanced Directives      Do you have a living will? no      Do you have a DNR form?     no                             If not, do you want to discuss one?      Do you have signed POA/HPOA for forms? No      Functional Status      Do you have difficulty bathing or dressing yourself?  No- shower      Do you have difficulty preparing food or eating? No- Don't want to      Do you have difficulty managing your medications?      Do you have difficulty managing your finances?      Do you have difficulty affording your medications? No- paid for by [employer] Strain: Not on [employer] Insecurity: Not on file  Transportation Needs: Not on file  Physical Activity: Not on file  Stress: Not on file  Social Connections: Not on file    Allergies  Allergen Reactions  . Ivp Dye [Iodinated Diagnostic Agents]     Blood Pressure dropped----pt was pre-medicated with 13 hour prep and did fine with pre-meds--amy 03/09/11   . Sulfa Antibiotics Hives  . Sulfonamide Derivatives Hives and Itching  . Lisinopril Cough  . Metoprolol Nausea And Vomiting  . Statins Other (See Comments)    myalgias  . Propoxyphene N-Acetaminophen Other (See Comments)    Sharp pains- headache     Current Outpatient Medications:  .  ceFEPime (MAXIPIME) IVPB, Inject 2  g into the vein every 8 (eight) hours. Indication:  Septic arthritis/osteomyelitis  First Dose: No Last Day of Therapy:  09/12/2020 Labs - Once weekly:  CBC/D and BMP, Labs - Every other week:  ESR and CRP Method of administration: IV Push Method of administration may be changed at the discretion of home infusion pharmacist based upon assessment of the patient and/or caregiver's ability to self-administer the medication ordered., Disp: 111 Units, Rfl: 0 .  ACCU-CHEK FASTCLIX LANCETS MISC, Use as directed to check blood glucose. Dx: E11.51, Disp: 102 each, Rfl: 6 .  acetaminophen (TYLENOL) 500 MG tablet, Take 1,000 mg by mouth every 6 (six) hours as needed for mild pain, fever or headache., Disp: , Rfl:  .  albuterol (VENTOLIN HFA) 108 (90 Base) MCG/ACT inhaler, Inhale 2 puffs into the lungs every 6 (six) hours as needed for wheezing or shortness of breath., Disp: , Rfl:  .  ARIPiprazole (ABILIFY) 2 MG tablet, 1 p.o. every morning for 1 week and then increase to 2 p.o. every morning., Disp: 60 tablet, Rfl: 1 .  aspirin EC 81 MG tablet, Take 1 tablet (81 mg total) by mouth daily. (Patient not taking: Reported on 07/28/2020), Disp: 30 tablet, Rfl: 0 .  Blood Glucose Monitoring Suppl (ACCU-CHEK AVIVA PLUS) w/Device KIT, Use as directed to check blood glucose daily. Dx: E11.51, Disp: 1 kit, Rfl: 0 .  BRILINTA 90 MG TABS tablet, Take 90 mg by mouth 2 (two) times daily., Disp: , Rfl:  .  buPROPion (WELLBUTRIN XL) 300 MG 24 hr tablet, Take 1 tablet (300 mg total) by mouth daily., Disp: 90 tablet, Rfl: 0 .  carvedilol (COREG) 3.125 MG tablet, Take 1 tablet (3.125 mg total) by mouth 2 (two) times daily with a meal., Disp: 60 tablet, Rfl: 1 .  celecoxib (CELEBREX) 200 MG capsule, TAKE 1 CAPSULE(200 MG) BY MOUTH DAILY (Patient taking differently: Take 200 mg by mouth 2 (two) times daily.), Disp: 30 capsule, Rfl: 0 .  Cholecalciferol (VITAMIN D3) 5000 units TABS, Take 5,000 Units by mouth daily., Disp: , Rfl:  .   clonazePAM (KLONOPIN) 0.5 MG tablet, Take 1 tablet (0.5 mg total) by mouth 2 (two) times daily as needed for anxiety., Disp: 60 tablet, Rfl: 1 .  ENTRESTO 49-51 MG, Take 1 tablet by mouth 2 (two) times daily., Disp: ,  Rfl:  .  furosemide (LASIX) 20 MG tablet, TAKE 1 TABLET BY MOUTH TWICE DAILY (Patient taking differently: Take 20 mg by mouth 2 (two) times daily.), Disp: 180 tablet, Rfl: 1 .  gabapentin (NEURONTIN) 400 MG capsule, 1 po q am, 1 po at lunch, 2 po qhs., Disp: 360 capsule, Rfl: 0 .  glucose blood (ACCU-CHEK AVIVA PLUS) test strip, 1 each by Other route as needed for other. Use as instructed. Dx: E11.51, Disp: 100 each, Rfl: 6 .  IRON PO, Take 1 capsule by mouth daily., Disp: , Rfl:  .  isosorbide mononitrate (IMDUR) 60 MG 24 hr tablet, Take 60 mg by mouth daily., Disp: , Rfl:  .  JANUVIA 100 MG tablet, TAKE 1 TABLET BY MOUTH EVERY DAY (Patient taking differently: Take 100 mg by mouth daily.), Disp: 90 tablet, Rfl: 1 .  Multiple Vitamin (MULTIVITAMIN WITH MINERALS) TABS tablet, Take 1 tablet by mouth daily., Disp: , Rfl:  .  NARCAN 4 MG/0.1ML LIQD nasal spray kit, Place 4 mg into the nose as needed (opioid reversal).  (Patient not taking: Reported on 07/28/2020), Disp: , Rfl:  .  nitroGLYCERIN (NITROSTAT) 0.4 MG SL tablet, PLACE 1 TABLET UNDER THE TONGUE EVERY 5 MINUTES AS NEEDED FOR CHEST PAIN (Patient taking differently: Place 0.4 mg under the tongue every 5 (five) minutes as needed for chest pain.), Disp: 25 tablet, Rfl: 2 .  omeprazole (PRILOSEC) 40 MG capsule, Take 40 mg by mouth 2 (two) times daily., Disp: , Rfl:  .  oxyCODONE-acetaminophen (PERCOCET) 10-325 MG tablet, Take 1 tablet by mouth in the morning and at bedtime.  (Patient not taking: Reported on 07/28/2020), Disp: , Rfl:  .  rOPINIRole (REQUIP) 1 MG tablet, Take 1 tablet (1 mg total) by mouth in the morning and at bedtime., Disp: 180 tablet, Rfl: 0 .  tamsulosin (FLOMAX) 0.4 MG CAPS capsule, Take 1 capsule (0.4 mg total) by  mouth 2 (two) times daily. (Patient not taking: Reported on 07/28/2020), Disp: 180 capsule, Rfl: 0    Review of Systems  Constitutional: Negative for chills and fever.  HENT: Negative for congestion and sore throat.   Eyes: Negative for photophobia.  Respiratory: Negative for cough, shortness of breath and wheezing.   Cardiovascular: Negative for chest pain, palpitations and leg swelling.  Gastrointestinal: Negative for abdominal pain, blood in stool, constipation, diarrhea, nausea and vomiting.  Genitourinary: Negative for dysuria, flank pain and hematuria.  Musculoskeletal: Positive for arthralgias and joint swelling. Negative for back pain and myalgias.  Skin: Negative for rash.  Neurological: Negative for dizziness, weakness and headaches.  Hematological: Does not bruise/bleed easily.  Psychiatric/Behavioral: Negative for agitation, confusion, decreased concentration, hallucinations and suicidal ideas.       Objective:   Physical Exam Constitutional:      General: He is not in acute distress.    Appearance: Normal appearance. He is well-developed and well-nourished. He is not ill-appearing or diaphoretic.  HENT:     Head: Normocephalic and atraumatic.     Right Ear: Hearing and external ear normal.     Left Ear: Hearing and external ear normal.     Nose: No nasal deformity, rhinorrhea or epistaxis.  Eyes:     General: No scleral icterus.    Extraocular Movements: EOM normal.     Conjunctiva/sclera: Conjunctivae normal.     Right eye: Right conjunctiva is not injected.     Left eye: Left conjunctiva is not injected.  Neck:     Vascular: No  JVD.  Cardiovascular:     Rate and Rhythm: Normal rate and regular rhythm.     Heart sounds: S1 normal and S2 normal.  Abdominal:     General: Bowel sounds are normal. There is no distension or ascites.     Palpations: Abdomen is soft. There is no hepatosplenomegaly.     Tenderness: There is no abdominal tenderness.  Musculoskeletal:      Right shoulder: Tenderness present. Decreased range of motion.     Left shoulder: Tenderness present. Decreased range of motion.     Cervical back: Normal range of motion and neck supple.     Right hip: Normal.     Left hip: Normal.     Right knee: Normal.     Left knee: Normal.  Lymphadenopathy:     Head:     Right side of head: No submandibular, preauricular or posterior auricular adenopathy.     Left side of head: No submandibular, preauricular or posterior auricular adenopathy.     Cervical: No cervical adenopathy.     Right cervical: No superficial or deep cervical adenopathy.    Left cervical: No superficial or deep cervical adenopathy.  Skin:    General: Skin is warm, dry and intact.     Coloration: Skin is not pale.     Findings: No abrasion, bruising, ecchymosis, erythema, lesion or rash.     Nails: There is no clubbing or cyanosis.  Neurological:     General: No focal deficit present.     Mental Status: He is alert and oriented to person, place, and time.     Sensory: No sensory deficit.     Coordination: Coordination normal.     Gait: Gait normal.     Deep Tendon Reflexes: Strength normal.  Psychiatric:        Attention and Perception: Attention normal. He is attentive.        Mood and Affect: Mood and affect normal.        Speech: Speech normal.        Behavior: Behavior normal. Behavior is cooperative.        Thought Content: Thought content normal.        Cognition and Memory: Memory is impaired.        Judgment: Judgment normal.     Left shoulder       Right shoulder        Central line          Assessment & Plan:  Bilateral septic arthritis with Group B streptococcus and Pseudomonas (though latter only isolated on one aspirate)  I think he clearly needs to be admitted to the hospital and seen formally by Orthopedics  I have called Dr. Lorin Mercy who has agreed to have the patient admitted to the hospital  I have also discussed the  case with Dr. Onnie Graham over the phone.  He agreed that they would see the patient when the patient is admitted though he was skeptical that he had many surgical options to offer the patient.  I certainly think at minimum the patient's shoulder should both be imaged with MRIs to give [place] more information about what is going on he had documented osteomyelitis on MRI of the right shoulder previously and perhaps some of his pathology is due to progression here.  Inflammatory markers also remain high with his sed rate at 70 and CRP at 15.  Fact that he has developed some renal failure makes me concerned that he is developed  being some systemic symptoms as well.  Acute renal failure: Likely due to prerenal failure have asked him to stop his Lasix and his cefepime dose is being adjusted.  He reminded me that he has congestive heart failure and I agreed that this is another complication of trying to balance his fluid status and the need again for him to be admitted to the hospital  CHF: Again I think he needs to hold the Lasix in the context of his acute renal failure.  I spent greater than 40 minutes with the patient including greater than 50% of time in face to face counsel of the patient and his caregiver regarding his infection, and in coordination of his care with the hospitalist service and orthopedic surgeon

## 2020-08-22 ENCOUNTER — Inpatient Hospital Stay (HOSPITAL_COMMUNITY)
Admission: AD | Admit: 2020-08-22 | Discharge: 2020-09-06 | DRG: 682 | Disposition: A | Discharge status: Home Health | Payer: Medicare Other | Source: Ambulatory Visit | Attending: Internal Medicine | Admitting: Internal Medicine

## 2020-08-22 ENCOUNTER — Encounter (HOSPITAL_COMMUNITY): Payer: Self-pay | Admitting: Internal Medicine

## 2020-08-22 DIAGNOSIS — N179 Acute kidney failure, unspecified: Secondary | ICD-10-CM | POA: Diagnosis present

## 2020-08-22 DIAGNOSIS — I252 Old myocardial infarction: Secondary | ICD-10-CM

## 2020-08-22 DIAGNOSIS — Z87891 Personal history of nicotine dependence: Secondary | ICD-10-CM

## 2020-08-22 DIAGNOSIS — Z1389 Encounter for screening for other disorder: Secondary | ICD-10-CM

## 2020-08-22 DIAGNOSIS — G9349 Other encephalopathy: Secondary | ICD-10-CM | POA: Diagnosis present

## 2020-08-22 DIAGNOSIS — F05 Delirium due to known physiological condition: Secondary | ICD-10-CM | POA: Diagnosis not present

## 2020-08-22 DIAGNOSIS — Z20822 Contact with and (suspected) exposure to covid-19: Secondary | ICD-10-CM | POA: Diagnosis present

## 2020-08-22 DIAGNOSIS — Z7982 Long term (current) use of aspirin: Secondary | ICD-10-CM

## 2020-08-22 DIAGNOSIS — M810 Age-related osteoporosis without current pathological fracture: Secondary | ICD-10-CM | POA: Diagnosis present

## 2020-08-22 DIAGNOSIS — G9340 Encephalopathy, unspecified: Secondary | ICD-10-CM

## 2020-08-22 DIAGNOSIS — I110 Hypertensive heart disease with heart failure: Secondary | ICD-10-CM | POA: Diagnosis present

## 2020-08-22 DIAGNOSIS — Z79891 Long term (current) use of opiate analgesic: Secondary | ICD-10-CM

## 2020-08-22 DIAGNOSIS — M00819 Arthritis due to other bacteria, unspecified shoulder: Secondary | ICD-10-CM

## 2020-08-22 DIAGNOSIS — J449 Chronic obstructive pulmonary disease, unspecified: Secondary | ICD-10-CM | POA: Diagnosis present

## 2020-08-22 DIAGNOSIS — M009 Pyogenic arthritis, unspecified: Secondary | ICD-10-CM | POA: Diagnosis present

## 2020-08-22 DIAGNOSIS — Z1621 Resistance to vancomycin: Secondary | ICD-10-CM | POA: Diagnosis present

## 2020-08-22 DIAGNOSIS — E1142 Type 2 diabetes mellitus with diabetic polyneuropathy: Secondary | ICD-10-CM | POA: Diagnosis present

## 2020-08-22 DIAGNOSIS — N400 Enlarged prostate without lower urinary tract symptoms: Secondary | ICD-10-CM | POA: Diagnosis present

## 2020-08-22 DIAGNOSIS — B965 Pseudomonas (aeruginosa) (mallei) (pseudomallei) as the cause of diseases classified elsewhere: Secondary | ICD-10-CM | POA: Diagnosis not present

## 2020-08-22 DIAGNOSIS — G894 Chronic pain syndrome: Secondary | ICD-10-CM | POA: Diagnosis present

## 2020-08-22 DIAGNOSIS — M86621 Other chronic osteomyelitis, right  humerus: Secondary | ICD-10-CM | POA: Diagnosis present

## 2020-08-22 DIAGNOSIS — Z7902 Long term (current) use of antithrombotics/antiplatelets: Secondary | ICD-10-CM

## 2020-08-22 DIAGNOSIS — B952 Enterococcus as the cause of diseases classified elsewhere: Secondary | ICD-10-CM | POA: Diagnosis present

## 2020-08-22 DIAGNOSIS — E1169 Type 2 diabetes mellitus with other specified complication: Secondary | ICD-10-CM | POA: Diagnosis present

## 2020-08-22 DIAGNOSIS — Z91041 Radiographic dye allergy status: Secondary | ICD-10-CM

## 2020-08-22 DIAGNOSIS — F419 Anxiety disorder, unspecified: Secondary | ICD-10-CM | POA: Diagnosis present

## 2020-08-22 DIAGNOSIS — Z6824 Body mass index (BMI) 24.0-24.9, adult: Secondary | ICD-10-CM

## 2020-08-22 DIAGNOSIS — Z833 Family history of diabetes mellitus: Secondary | ICD-10-CM

## 2020-08-22 DIAGNOSIS — M869 Osteomyelitis, unspecified: Secondary | ICD-10-CM | POA: Diagnosis present

## 2020-08-22 DIAGNOSIS — E870 Hyperosmolality and hypernatremia: Secondary | ICD-10-CM | POA: Diagnosis present

## 2020-08-22 DIAGNOSIS — Z8619 Personal history of other infectious and parasitic diseases: Secondary | ICD-10-CM | POA: Diagnosis not present

## 2020-08-22 DIAGNOSIS — E871 Hypo-osmolality and hyponatremia: Secondary | ICD-10-CM | POA: Diagnosis not present

## 2020-08-22 DIAGNOSIS — I5022 Chronic systolic (congestive) heart failure: Secondary | ICD-10-CM | POA: Diagnosis not present

## 2020-08-22 DIAGNOSIS — E872 Acidosis: Secondary | ICD-10-CM | POA: Diagnosis present

## 2020-08-22 DIAGNOSIS — I2510 Atherosclerotic heart disease of native coronary artery without angina pectoris: Secondary | ICD-10-CM | POA: Diagnosis present

## 2020-08-22 DIAGNOSIS — H5462 Unqualified visual loss, left eye, normal vision right eye: Secondary | ICD-10-CM | POA: Diagnosis present

## 2020-08-22 DIAGNOSIS — R64 Cachexia: Secondary | ICD-10-CM | POA: Diagnosis present

## 2020-08-22 DIAGNOSIS — E876 Hypokalemia: Secondary | ICD-10-CM | POA: Diagnosis not present

## 2020-08-22 DIAGNOSIS — M00811 Arthritis due to other bacteria, right shoulder: Secondary | ICD-10-CM | POA: Diagnosis not present

## 2020-08-22 DIAGNOSIS — Z89021 Acquired absence of right finger(s): Secondary | ICD-10-CM

## 2020-08-22 DIAGNOSIS — Z79899 Other long term (current) drug therapy: Secondary | ICD-10-CM

## 2020-08-22 DIAGNOSIS — J9601 Acute respiratory failure with hypoxia: Secondary | ICD-10-CM | POA: Diagnosis not present

## 2020-08-22 DIAGNOSIS — Z992 Dependence on renal dialysis: Secondary | ICD-10-CM | POA: Diagnosis not present

## 2020-08-22 DIAGNOSIS — E782 Mixed hyperlipidemia: Secondary | ICD-10-CM | POA: Diagnosis present

## 2020-08-22 DIAGNOSIS — I10 Essential (primary) hypertension: Secondary | ICD-10-CM

## 2020-08-22 DIAGNOSIS — K219 Gastro-esophageal reflux disease without esophagitis: Secondary | ICD-10-CM | POA: Diagnosis present

## 2020-08-22 DIAGNOSIS — E785 Hyperlipidemia, unspecified: Secondary | ICD-10-CM | POA: Diagnosis present

## 2020-08-22 DIAGNOSIS — M00812 Arthritis due to other bacteria, left shoulder: Secondary | ICD-10-CM | POA: Diagnosis not present

## 2020-08-22 DIAGNOSIS — M19012 Primary osteoarthritis, left shoulder: Secondary | ICD-10-CM | POA: Diagnosis present

## 2020-08-22 DIAGNOSIS — E119 Type 2 diabetes mellitus without complications: Secondary | ICD-10-CM | POA: Diagnosis not present

## 2020-08-22 DIAGNOSIS — Z8262 Family history of osteoporosis: Secondary | ICD-10-CM

## 2020-08-22 DIAGNOSIS — Z7189 Other specified counseling: Secondary | ICD-10-CM | POA: Diagnosis not present

## 2020-08-22 DIAGNOSIS — Z515 Encounter for palliative care: Secondary | ICD-10-CM | POA: Diagnosis not present

## 2020-08-22 DIAGNOSIS — E1151 Type 2 diabetes mellitus with diabetic peripheral angiopathy without gangrene: Secondary | ICD-10-CM | POA: Diagnosis present

## 2020-08-22 DIAGNOSIS — I5042 Chronic combined systolic (congestive) and diastolic (congestive) heart failure: Secondary | ICD-10-CM | POA: Diagnosis present

## 2020-08-22 DIAGNOSIS — F411 Generalized anxiety disorder: Secondary | ICD-10-CM | POA: Diagnosis not present

## 2020-08-22 DIAGNOSIS — Z951 Presence of aortocoronary bypass graft: Secondary | ICD-10-CM

## 2020-08-22 DIAGNOSIS — R0902 Hypoxemia: Secondary | ICD-10-CM

## 2020-08-22 DIAGNOSIS — I480 Paroxysmal atrial fibrillation: Secondary | ICD-10-CM | POA: Diagnosis not present

## 2020-08-22 DIAGNOSIS — Z8249 Family history of ischemic heart disease and other diseases of the circulatory system: Secondary | ICD-10-CM

## 2020-08-22 DIAGNOSIS — R4182 Altered mental status, unspecified: Secondary | ICD-10-CM | POA: Diagnosis not present

## 2020-08-22 DIAGNOSIS — Z882 Allergy status to sulfonamides status: Secondary | ICD-10-CM

## 2020-08-22 DIAGNOSIS — F4310 Post-traumatic stress disorder, unspecified: Secondary | ICD-10-CM | POA: Diagnosis present

## 2020-08-22 DIAGNOSIS — M86221 Subacute osteomyelitis, right humerus: Secondary | ICD-10-CM

## 2020-08-22 DIAGNOSIS — M19011 Primary osteoarthritis, right shoulder: Secondary | ICD-10-CM | POA: Diagnosis present

## 2020-08-22 DIAGNOSIS — M86111 Other acute osteomyelitis, right shoulder: Secondary | ICD-10-CM | POA: Diagnosis not present

## 2020-08-22 DIAGNOSIS — M00211 Other streptococcal arthritis, right shoulder: Secondary | ICD-10-CM | POA: Diagnosis not present

## 2020-08-22 DIAGNOSIS — F32A Depression, unspecified: Secondary | ICD-10-CM | POA: Diagnosis present

## 2020-08-22 DIAGNOSIS — R0602 Shortness of breath: Secondary | ICD-10-CM

## 2020-08-22 DIAGNOSIS — I69398 Other sequelae of cerebral infarction: Secondary | ICD-10-CM

## 2020-08-22 DIAGNOSIS — B951 Streptococcus, group B, as the cause of diseases classified elsewhere: Secondary | ICD-10-CM | POA: Diagnosis not present

## 2020-08-22 DIAGNOSIS — D649 Anemia, unspecified: Secondary | ICD-10-CM | POA: Diagnosis present

## 2020-08-22 DIAGNOSIS — Z888 Allergy status to other drugs, medicaments and biological substances status: Secondary | ICD-10-CM

## 2020-08-22 DIAGNOSIS — G2581 Restless legs syndrome: Secondary | ICD-10-CM | POA: Diagnosis present

## 2020-08-22 LAB — HEMOGLOBIN A1C
Hgb A1c MFr Bld: 6.5 % — ABNORMAL HIGH (ref 4.8–5.6)
Mean Plasma Glucose: 139.85 mg/dL

## 2020-08-22 LAB — CBC WITH DIFFERENTIAL/PLATELET
Abs Immature Granulocytes: 0.03 10*3/uL (ref 0.00–0.07)
Basophils Absolute: 0.1 10*3/uL (ref 0.0–0.1)
Basophils Relative: 1 %
Eosinophils Absolute: 0.9 10*3/uL — ABNORMAL HIGH (ref 0.0–0.5)
Eosinophils Relative: 12 %
HCT: 29.6 % — ABNORMAL LOW (ref 39.0–52.0)
Hemoglobin: 9.7 g/dL — ABNORMAL LOW (ref 13.0–17.0)
Immature Granulocytes: 0 %
Lymphocytes Relative: 12 %
Lymphs Abs: 0.8 10*3/uL (ref 0.7–4.0)
MCH: 29.1 pg (ref 26.0–34.0)
MCHC: 32.8 g/dL (ref 30.0–36.0)
MCV: 88.9 fL (ref 80.0–100.0)
Monocytes Absolute: 0.8 10*3/uL (ref 0.1–1.0)
Monocytes Relative: 11 %
Neutro Abs: 4.3 10*3/uL (ref 1.7–7.7)
Neutrophils Relative %: 64 %
Platelets: 242 10*3/uL (ref 150–400)
RBC: 3.33 MIL/uL — ABNORMAL LOW (ref 4.22–5.81)
RDW: 17.5 % — ABNORMAL HIGH (ref 11.5–15.5)
WBC: 6.8 10*3/uL (ref 4.0–10.5)
nRBC: 0 % (ref 0.0–0.2)

## 2020-08-22 LAB — COMPREHENSIVE METABOLIC PANEL
ALT: 16 U/L (ref 0–44)
AST: 17 U/L (ref 15–41)
Albumin: 2.6 g/dL — ABNORMAL LOW (ref 3.5–5.0)
Alkaline Phosphatase: 106 U/L (ref 38–126)
Anion gap: 14 (ref 5–15)
BUN: 86 mg/dL — ABNORMAL HIGH (ref 8–23)
CO2: 14 mmol/L — ABNORMAL LOW (ref 22–32)
Calcium: 9.4 mg/dL (ref 8.9–10.3)
Chloride: 101 mmol/L (ref 98–111)
Creatinine, Ser: 4.13 mg/dL — ABNORMAL HIGH (ref 0.61–1.24)
GFR, Estimated: 14 mL/min — ABNORMAL LOW (ref >60)
Glucose, Bld: 87 mg/dL (ref 70–99)
Potassium: 5.3 mmol/L — ABNORMAL HIGH (ref 3.5–5.1)
Sodium: 129 mmol/L — ABNORMAL LOW (ref 135–145)
Total Bilirubin: 0.7 mg/dL (ref 0.3–1.2)
Total Protein: 7.3 g/dL (ref 6.5–8.1)

## 2020-08-22 LAB — GLUCOSE, CAPILLARY: Glucose-Capillary: 70 mg/dL (ref 70–99)

## 2020-08-22 LAB — BRAIN NATRIURETIC PEPTIDE: B Natriuretic Peptide: 1652.4 pg/mL — ABNORMAL HIGH (ref 0.0–100.0)

## 2020-08-22 MED ORDER — FERROUS SULFATE 325 (65 FE) MG PO TABS
325.0000 mg | ORAL_TABLET | Freq: Every day | ORAL | Status: DC
  Administered 2020-08-28 – 2020-09-06 (×10): 325 mg via ORAL
  Filled 2020-08-22 (×10): qty 1

## 2020-08-22 MED ORDER — SENNA 8.6 MG PO TABS
1.0000 | ORAL_TABLET | Freq: Two times a day (BID) | ORAL | Status: DC
  Administered 2020-08-22 – 2020-09-06 (×15): 8.6 mg via ORAL
  Filled 2020-08-22 (×18): qty 1

## 2020-08-22 MED ORDER — GABAPENTIN 400 MG PO CAPS
800.0000 mg | ORAL_CAPSULE | Freq: Every day | ORAL | Status: DC
  Administered 2020-08-22: 800 mg via ORAL
  Filled 2020-08-22: qty 2

## 2020-08-22 MED ORDER — LINAGLIPTIN 5 MG PO TABS
5.0000 mg | ORAL_TABLET | Freq: Every day | ORAL | Status: DC
  Filled 2020-08-22: qty 1

## 2020-08-22 MED ORDER — CLONAZEPAM 0.5 MG PO TABS: 0.5000 mg | ORAL_TABLET | Freq: Two times a day (BID) | ORAL | Status: DC | PRN

## 2020-08-22 MED ORDER — GABAPENTIN 400 MG PO CAPS
400.0000 mg | ORAL_CAPSULE | Freq: Two times a day (BID) | ORAL | Status: DC
  Filled 2020-08-22 (×2): qty 1

## 2020-08-22 MED ORDER — PANTOPRAZOLE SODIUM 40 MG PO TBEC
80.0000 mg | DELAYED_RELEASE_TABLET | Freq: Every day | ORAL | Status: DC
  Filled 2020-08-22: qty 2

## 2020-08-22 MED ORDER — CARVEDILOL 3.125 MG PO TABS
3.1250 mg | ORAL_TABLET | Freq: Two times a day (BID) | ORAL | Status: DC
  Filled 2020-08-22: qty 1

## 2020-08-22 MED ORDER — BUPROPION HCL ER (XL) 300 MG PO TB24
300.0000 mg | ORAL_TABLET | Freq: Every day | ORAL | Status: DC
  Filled 2020-08-22: qty 1
  Filled 2020-08-22: qty 2

## 2020-08-22 MED ORDER — ARIPIPRAZOLE 5 MG PO TABS
5.0000 mg | ORAL_TABLET | Freq: Every day | ORAL | Status: DC
  Administered 2020-08-28 – 2020-09-06 (×10): 5 mg via ORAL
  Filled 2020-08-22 (×2): qty 3
  Filled 2020-08-22: qty 1
  Filled 2020-08-22 (×5): qty 3
  Filled 2020-08-22: qty 1
  Filled 2020-08-22 (×5): qty 3
  Filled 2020-08-22: qty 1
  Filled 2020-08-22: qty 3

## 2020-08-22 MED ORDER — INSULIN ASPART 100 UNIT/ML ~~LOC~~ SOLN
0.0000 [IU] | Freq: Three times a day (TID) | SUBCUTANEOUS | Status: DC
  Administered 2020-08-25: 1 [IU] via SUBCUTANEOUS
  Administered 2020-08-28 (×2): 2 [IU] via SUBCUTANEOUS
  Administered 2020-08-29: 3 [IU] via SUBCUTANEOUS
  Administered 2020-08-29: 1 [IU] via SUBCUTANEOUS
  Administered 2020-08-31: 2 [IU] via SUBCUTANEOUS
  Administered 2020-08-31: 1 [IU] via SUBCUTANEOUS
  Administered 2020-09-01: 2 [IU] via SUBCUTANEOUS
  Administered 2020-09-01: 1 [IU] via SUBCUTANEOUS
  Administered 2020-09-01 – 2020-09-02 (×2): 2 [IU] via SUBCUTANEOUS
  Administered 2020-09-02: 1 [IU] via SUBCUTANEOUS
  Administered 2020-09-03 – 2020-09-04 (×4): 2 [IU] via SUBCUTANEOUS
  Administered 2020-09-04 – 2020-09-05 (×2): 1 [IU] via SUBCUTANEOUS
  Administered 2020-09-05: 2 [IU] via SUBCUTANEOUS
  Administered 2020-09-06: 3 [IU] via SUBCUTANEOUS
  Administered 2020-09-06: 2 [IU] via SUBCUTANEOUS

## 2020-08-22 MED ORDER — GABAPENTIN 400 MG PO CAPS: 400.0000 mg | ORAL_CAPSULE | Freq: Three times a day (TID) | ORAL | Status: DC

## 2020-08-22 MED ORDER — TAMSULOSIN HCL 0.4 MG PO CAPS
0.4000 mg | ORAL_CAPSULE | Freq: Two times a day (BID) | ORAL | Status: DC
  Administered 2020-08-22 – 2020-09-06 (×20): 0.4 mg via ORAL
  Filled 2020-08-22 (×21): qty 1

## 2020-08-22 MED ORDER — TICAGRELOR 90 MG PO TABS
90.0000 mg | ORAL_TABLET | Freq: Two times a day (BID) | ORAL | Status: DC
Start: 2020-08-22 — End: 2020-08-27
  Administered 2020-08-22: 90 mg via ORAL
  Filled 2020-08-22 (×4): qty 1

## 2020-08-22 MED ORDER — ENOXAPARIN SODIUM 40 MG/0.4ML ~~LOC~~ SOLN
40.0000 mg | Freq: Every day | SUBCUTANEOUS | Status: DC
  Administered 2020-08-22: 40 mg via SUBCUTANEOUS
  Filled 2020-08-22: qty 0.4

## 2020-08-22 MED ORDER — CEFEPIME IV (FOR PTA / DISCHARGE USE ONLY): 2.0000 g | Freq: Three times a day (TID) | INTRAVENOUS | Status: DC

## 2020-08-22 MED ORDER — SACUBITRIL-VALSARTAN 49-51 MG PO TABS
1.0000 | ORAL_TABLET | Freq: Two times a day (BID) | ORAL | Status: DC
  Administered 2020-08-22: 1 via ORAL
  Filled 2020-08-22 (×2): qty 1

## 2020-08-22 MED ORDER — CELECOXIB 200 MG PO CAPS
200.0000 mg | ORAL_CAPSULE | Freq: Two times a day (BID) | ORAL | Status: DC
Start: 2020-08-22 — End: 2020-08-23
  Administered 2020-08-22 – 2020-08-23 (×2): 200 mg via ORAL
  Filled 2020-08-22 (×2): qty 1

## 2020-08-22 MED ORDER — SODIUM CHLORIDE 0.9% FLUSH
10.0000 mL | INTRAVENOUS | Status: DC | PRN
  Administered 2020-08-29: 10 mL

## 2020-08-22 MED ORDER — ACETAMINOPHEN 500 MG PO TABS
1000.0000 mg | ORAL_TABLET | Freq: Four times a day (QID) | ORAL | Status: DC | PRN
  Administered 2020-08-22 – 2020-09-03 (×3): 1000 mg via ORAL
  Filled 2020-08-22 (×3): qty 2

## 2020-08-22 MED ORDER — SODIUM CHLORIDE 0.9 % IV SOLN
2.0000 g | INTRAVENOUS | Status: DC
  Administered 2020-08-23: 2 g via INTRAVENOUS
  Filled 2020-08-22 (×2): qty 2

## 2020-08-22 MED ORDER — FUROSEMIDE 20 MG PO TABS
20.0000 mg | ORAL_TABLET | Freq: Two times a day (BID) | ORAL | Status: DC
  Filled 2020-08-22: qty 1

## 2020-08-22 MED ORDER — ISOSORBIDE MONONITRATE ER 60 MG PO TB24
60.0000 mg | ORAL_TABLET | Freq: Every day | ORAL | Status: DC
  Administered 2020-08-28 – 2020-09-06 (×10): 60 mg via ORAL
  Filled 2020-08-22 (×11): qty 1

## 2020-08-22 MED ORDER — CHLORHEXIDINE GLUCONATE CLOTH 2 % EX PADS
6.0000 | MEDICATED_PAD | Freq: Every day | CUTANEOUS | Status: DC
  Administered 2020-08-24 – 2020-09-06 (×14): 6 via TOPICAL

## 2020-08-22 MED ORDER — ALBUTEROL SULFATE HFA 108 (90 BASE) MCG/ACT IN AERS: 2.0000 | INHALATION_SPRAY | Freq: Four times a day (QID) | RESPIRATORY_TRACT | Status: DC | PRN

## 2020-08-22 MED ORDER — SODIUM CHLORIDE 0.9 % IV SOLN: INTRAVENOUS | Status: DC

## 2020-08-22 MED ORDER — NITROGLYCERIN 0.4 MG SL SUBL: 0.4000 mg | SUBLINGUAL_TABLET | SUBLINGUAL | Status: DC | PRN

## 2020-08-22 MED ORDER — ROPINIROLE HCL 1 MG PO TABS
1.0000 mg | ORAL_TABLET | Freq: Two times a day (BID) | ORAL | Status: DC
  Administered 2020-08-22 – 2020-09-06 (×20): 1 mg via ORAL
  Filled 2020-08-22 (×20): qty 1
  Filled 2020-08-22 (×2): qty 2
  Filled 2020-08-22 (×9): qty 1

## 2020-08-22 NOTE — Progress Notes (Signed)
Pharmacy Antibiotic Note  [name] is a 75 y.o. male admitted on 08/22/2020 with Bilateral septic arthritis with Group B streptococcus and Pseudomonas. Pt being followed by ID as outpt and on Cefepime (planned 8 wks course with stop date of 1/28). Pharmacy has been consulted for Cefepime dosing. Pt received last dose sometime this morning - not sure exact time.  Plan: Cefepime 2gm IV q24h Will f/u renal function, micro data, and pt's clinical condition  Height: 6' (182.9 cm) Weight: 89.3 kg (196 lb 13.9 oz) IBW/kg (Calculated) : 77.6  Temp (24hrs), Avg:97.5 F (36.4 C), Min:97.3 F (36.3 C), Max:97.7 F (36.5 C)  Recent Labs  Lab 08/22/20 2201  WBC 6.8  CREATININE 4.13*    Estimated Creatinine Clearance: 17.2 mL/min (A) (by C-G formula based on SCr of 4.13 mg/dL (H)).    Allergies  Allergen Reactions  . Ivp Dye [Iodinated Diagnostic Agents]     Blood Pressure dropped----pt was pre-medicated with 13 hour prep and did fine with pre-meds--amy 03/09/11   . Sulfa Antibiotics Hives  . Sulfonamide Derivatives Hives and Itching  . Lisinopril Cough  . Metoprolol Nausea And Vomiting  . Statins Other (See Comments)    myalgias  . Propoxyphene N-Acetaminophen Other (See Comments)    Sharp pains- headache    Antimicrobials this admission: Cefepime 12/17 (Started last admission) >> 1/28   Thank you for allowing pharmacy to be a part of this patient's care.  Sherlon Handing, PharmD, BCPS Please see amion for complete clinical pharmacist phone list 08/22/2020 11:43 PM

## 2020-08-22 NOTE — Progress Notes (Signed)
Patient arrived to [facility] as a direct admit from home at 2030.  Admission has been contacted to notify the MD on call for orders.  Awaiting orders for patient.  Orders received.

## 2020-08-22 NOTE — H&P (Signed)
History and Physical    Arley Salamone LYY:[id number] DOB: May 23, 1946 DOA: 08/22/2020  PCP: Patrecia Pour, Christean Grief, MD (Confirm with patient/family/NH records and if not entered, this has to be entered at [facility] point of entry) Patient coming from: Home  I have personally briefly reviewed patient's old medical records in [place]  Chief Complaint: referred for direct admisson by Dr. [name] for renal failure, FTT  HPI: [name] is a 75 y.o. male with medical history significant of multiple medical problems including chronic systolic heart failure, DM with completication of neuropathy, PAD, ASVD diffuse. He has a h/o chronic shoulder problems. He was admitted 07/09/20-08/01/20 for septic arthritis both shoulders with GBS and Pseudomonas with septic shock. He was also found to have osteomyelitis right humerus. A PICC line was placed and the patient was discharged on 8 weeks of cefipeme 2g TID to end January 28th.  He was seen by Dr. [name] for ID follow-up 08/21/20. At that encounter Dr. [name] recommended the patient be admitted for continue care, management of renal failure and continued orthopedic consultation. It took until late on the day of admission, 08/22/20, for a bed to be available.   Patient reports he has been able to eat and drink. He has been taking his medications. He reports that both shoulders cause him a lot of pain.     ED Course: Patient not seen in ED - direct admit.  Review of Systems: As per HPI otherwise 10 point review of systems negative.    Past Medical History:  Diagnosis Date  . Acute renal failure (ARF) ([place]) 08/21/2020  . Anemia    NOS  . Anxiety   . 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 BPH (benign prostatic hypertrophy)   . Broken foot Oct. 12, 2013   Right foot Fx  . CAD (coronary artery disease)   . Carotid stenosis, left   . Cellulitis and abscess of leg 03/2018   right leg  . Chronic hyponatremia   . Chronic pain  syndrome   . COPD (chronic obstructive pulmonary disease) ([place])   . Depression   . Diabetes mellitus   . Diabetic foot ulcer ([place])   . DJD (degenerative joint disease)    L wrist  . DM2 (diabetes mellitus, type 2) ([place])   . GERD (gastroesophageal reflux disease)   . HLD (hyperlipidemia)   . HTN (hypertension)   . Hx of blood transfusion reaction   . Hyponatremia   . Lumbar disc disease   . MI (mitral incompetence)   . Myocardial infarction ([place])    x 2  . Osteoporosis    tx per [facility], Dr Prudencio Burly, thought due to heavy steriod use after 1978  . Peripheral neuropathy   . Peripheral vascular disease ([place])   . Restless leg syndrome   . Spine fracture    hx, multiple  . Varicose veins   . 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 Vitamin D deficiency     Past Surgical History:  Procedure Laterality Date  . ANGIOPLASTY    . aorta bifemoral bypass grafting  09/2010  . CARDIAC CATHETERIZATION N/A 09/19/2015   Procedure: Left Heart Cath and Coronary Angiography;  Surgeon: Adrian Prows, MD;  Location: [facility];  Service: Cardiovascular;  Laterality: N/A;  . CARPAL TUNNEL RELEASE     right 2006/ left 2007  . CATARACT EXTRACTION     left  1996/ right 1997  . CORONARY ARTERY BYPASS GRAFT N/A 09/22/2015   Procedure: CORONARY ARTERY BYPASS GRAFTING (CABG) times four using the right greater saphenous vein harvested endoscopically and the left internal mammary artery.  LIMA-LAD, SEQ SVG-DIAG & OM, SVG-PD.;  Surgeon: [name], MD;  Location: [facility];  Service: Open Heart Surgery;  Laterality: N/A;  . I & D EXTREMITY Right 04/07/2018   Procedure: IRRIGATION AND DEBRIDEMENT ABSCESS RIGHT LEG;  Surgeon: Newt Minion, MD;  Location: [facility];  Service: Orthopedics;  Laterality: Right;  . INCISION AND DRAINAGE OF WOUND Right 05/07/2020   Procedure: RIGHT LONG FINGER IRRIGATION AND DEBRIDEMENT AND AMPUTATION;  Surgeon: Iran Planas, MD;  Location: [facility];  Service: Orthopedics;   Laterality: Right;  with IV sedation  . INCISION AND DRAINAGE OF WOUND Right 05/12/2020   Procedure: IRRIGATION AND DEBRIDEMENT WOUND and possible revision amputation;  Surgeon: Iran Planas, MD;  Location: [facility];  Service: Orthopedics;  Laterality: Right;  needs 60 minutes  . IR FLUORO GUIDE CV LINE RIGHT  07/18/2020  . IR US GUIDE VASC ACCESS RIGHT  07/18/2020  . L foot open repair jones fracture  2010    5th metetarsal   . left ankle ganglion cyst  1976  . left carotid endarterectomy  03/2011  . left cataract  1996   right - 1997  . left CTS  2006   R CTS - 2007  . left foot surgery  1998   R surgery/fracture - 1999  . left plantar ganglion cystectomy  1979  . left wrist/hand fusion  2008  . [place], 2004  . NASAL SINUS SURGERY     multiple- x8. last 1997 with obliteration  . REPAIR THORACIC AORTA  2017  . right hand fracture  1969  . ROTATOR CUFF REPAIR  2006   R, than repeat 2011, Dr Theda Sers  . SHOULDER ARTHROSCOPY Bilateral 07/17/2020   Procedure: Right shoulder arthroscopic lavage; Left shoulder aspiration.;  Surgeon: Justice Britain, MD;  Location: [facility];  Service: Orthopedics;  Laterality: Bilateral;  158mn  . SHOULDER ARTHROSCOPY WITH LABRAL REPAIR Left 07/10/2020   Procedure: SHOULDER ARTHROSCOPY WITH WASHOUT;  Surgeon: SJustice Britain MD;  Location: [facility];  Service: Orthopedics;  Laterality: Left;  . TRIGGER FINGER RELEASE Left 11/11/2014   Procedure: LEFT LONG FINGER RELEASE TRIGGER FINGER/A-1 PULLEY;  Surgeon: DMilly Jakob MD;  Location: [facility]  Service: Orthopedics;  Laterality: Left;  . vocal surgery  1996    Soc Hx -  Married 471years and divorced. He does have 2 sons, 4 grandchildren. He worked in the [employer] His father died of black lung/lung cancer, his grandfather worked in the mines and had black lung. Patient is retired.    reports that he quit smoking about 31 years ago. His  smoking use included cigarettes. He has a 45.00 pack-year smoking history. He has never used smokeless tobacco. He reports previous alcohol use. He reports that he does not use drugs.  Allergies  Allergen Reactions  . Ivp Dye [Iodinated Diagnostic Agents]     Blood Pressure dropped----pt was pre-medicated with 13 hour prep and did fine with pre-meds--amy 03/09/11   . Sulfa Antibiotics Hives  . Sulfonamide Derivatives Hives and Itching  . Lisinopril Cough  . Metoprolol Nausea And Vomiting  . Statins Other (See Comments)    myalgias  . Propoxyphene N-Acetaminophen Other (See Comments)  Sharp pains- headache    Family History  Problem Relation Age of Onset  . Lung cancer Father 37  . Diabetes Mother 58  . Osteoporosis Mother   . Depression Mother   . Bleeding Disorder Mother   . Throat cancer Other        family hx - "bad living" - also lung CA, heart disease and renal failure    . Diabetes Sister   . Suicidality Son 76  . Lung cancer Maternal Grandmother 54  . Diabetes Maternal Grandfather 83  . Heart disease Maternal Grandfather   . Heart attack Paternal Grandmother   . Brain cancer Paternal Grandfather 33     Prior to Admission medications   Medication Sig Start Date End Date Taking? Authorizing Provider  ACCU-CHEK FASTCLIX LANCETS MISC Use as directed to check blood glucose. Dx: E11.51 01/17/18   [name], NP  acetaminophen (TYLENOL) 500 MG tablet Take 1,000 mg by mouth every 6 (six) hours as needed for mild pain, fever or headache.    [provider]  albuterol (VENTOLIN HFA) 108 (90 Base) MCG/ACT inhaler Inhale 2 puffs into the lungs every 6 (six) hours as needed for wheezing or shortness of breath.    [provider]  ARIPiprazole (ABILIFY) 2 MG tablet 1 p.o. every morning for 1 week and then increase to 2 p.o. every morning. 08/06/20   Addison Lank, PA-C  aspirin EC 81 MG tablet Take 1 tablet (81 mg total) by mouth daily. Patient not  taking: Reported on 07/28/2020 07/15/20   Spero Geralds, MD  Blood Glucose Monitoring Suppl (ACCU-CHEK AVIVA PLUS) w/Device KIT Use as directed to check blood glucose daily. Dx: E11.51 01/17/18   [name], NP  BRILINTA 90 MG TABS tablet Take 90 mg by mouth 2 (two) times daily. 11/21/19   [provider]  buPROPion (WELLBUTRIN XL) 300 MG 24 hr tablet Take 1 tablet (300 mg total) by mouth daily. 07/24/20   Donnal Moat T, PA-C  carvedilol (COREG) 3.125 MG tablet Take 1 tablet (3.125 mg total) by mouth 2 (two) times daily with a meal. 07/22/20 09/20/20  Antonieta Pert, MD  ceFEPime (MAXIPIME) IVPB Inject 2 g into the vein every 8 (eight) hours. Indication:  Septic arthritis/osteomyelitis  First Dose: No Last Day of Therapy:  09/12/2020 Labs - Once weekly:  CBC/D and BMP, Labs - Every other week:  ESR and CRP Method of administration: IV Push Method of administration may be changed at the discretion of home infusion pharmacist based upon assessment of the patient and/or caregiver's ability to self-administer the medication ordered. 07/22/20 08/28/20  Antonieta Pert, MD  celecoxib (CELEBREX) 200 MG capsule TAKE 1 CAPSULE(200 MG) BY MOUTH DAILY Patient taking differently: Take 200 mg by mouth 2 (two) times daily. 10/02/18   [name], NP  Cholecalciferol (VITAMIN D3) 5000 units TABS Take 5,000 Units by mouth daily.    [provider]  clonazePAM (KLONOPIN) 0.5 MG tablet Take 1 tablet (0.5 mg total) by mouth 2 (two) times daily as needed for anxiety. 07/25/20   Hurst, Teresa T, PA-C  ENTRESTO 49-51 MG Take 1 tablet by mouth 2 (two) times daily. 12/11/19   [provider]  furosemide (LASIX) 20 MG tablet TAKE 1 TABLET BY MOUTH TWICE DAILY Patient taking differently: Take 20 mg by mouth 2 (two) times daily. 07/17/19   Adrian Prows, MD  gabapentin (NEURONTIN) 400 MG capsule 1 po q am, 1 po at lunch, 2  po qhs. 07/28/20   Donnal Moat T, PA-C  glucose blood (ACCU-CHEK AVIVA PLUS)  test strip 1 each by Other route as needed for other. Use as instructed. Dx: E11.51 01/17/18   [name], NP  IRON PO Take 1 capsule by mouth daily.    [provider]  isosorbide mononitrate (IMDUR) 60 MG 24 hr tablet Take 60 mg by mouth daily. 12/11/19   [provider]  JANUVIA 100 MG tablet TAKE 1 TABLET BY MOUTH EVERY DAY Patient taking differently: Take 100 mg by mouth daily. 05/19/18   [name], DO  Multiple Vitamin (MULTIVITAMIN WITH MINERALS) TABS tablet Take 1 tablet by mouth daily.    [provider]  NARCAN 4 MG/0.1ML LIQD nasal spray kit Place 4 mg into the nose as needed (opioid reversal).  Patient not taking: Reported on 07/28/2020 06/18/19   [provider]  nitroGLYCERIN (NITROSTAT) 0.4 MG SL tablet PLACE 1 TABLET UNDER THE TONGUE EVERY 5 MINUTES AS NEEDED FOR CHEST PAIN Patient taking differently: Place 0.4 mg under the tongue every 5 (five) minutes as needed for chest pain. 07/17/19   Adrian Prows, MD  omeprazole (PRILOSEC) 40 MG capsule Take 40 mg by mouth 2 (two) times daily. 11/26/19   [provider]  oxyCODONE-acetaminophen (PERCOCET) 10-325 MG tablet Take 1 tablet by mouth in the morning and at bedtime.  Patient not taking: Reported on 07/28/2020 12/24/19   [provider]  rOPINIRole (REQUIP) 1 MG tablet Take 1 tablet (1 mg total) by mouth in the morning and at bedtime. 07/24/20   Addison Lank, PA-C  tamsulosin (FLOMAX) 0.4 MG CAPS capsule Take 1 capsule (0.4 mg total) by mouth 2 (two) times daily. Patient not taking: Reported on 07/28/2020 11/16/18   [name], NP    Physical Exam: Vitals:   08/22/20 2059 08/22/20 2108 08/22/20 2115  BP:  95/64 (!) 144/65  Pulse:  77 77  Resp:  18 16  Temp:  (!) 97.3 F (36.3 C) 97.7 F (36.5 C)  TempSrc:  Oral Oral  SpO2:  (!) 70% 100%  Weight: 89.3 kg    Height: 6' (1.829 m)      Vitals:   08/22/20 2059 08/22/20 2108 08/22/20 2115  BP:  95/64 (!) 144/65   Pulse:  77 77  Resp:  18 16  Temp:  (!) 97.3 F (36.3 C) 97.7 F (36.5 C)  TempSrc:  Oral Oral  SpO2:  (!) 70% 100%  Weight: 89.3 kg    Height: 6' (1.829 m)     General: WNWD man, large frame, in no distress Eyes: PERRL, lids and conjunctivae normal ENMT: Mucous membranes are appear dry. Posterior pharynx clear of any exudate or lesions.Normal dentition.  Neck: normal, supple, no masses, no thyromegaly Chest - no deformity. PICC line right anterior chest - no swelling, erythema. Respiratory: clear to auscultation bilaterally, no wheezing, no crackles. Normal respiratory effort. No accessory muscle use.  Cardiovascular: Regular rate and rhythm, no murmurs / rubs / gallops. No extremity edema. trace pedal pulses with cold feet. No carotid bruits.  Abdomen: no tenderness, no masses palpated. No hepatosplenomegaly. Bowel sounds positive.  Musculoskeletal: Right shoulder with swelling and tenderness. Left shoulder with mild erythema, swelling and tenderness..  Skin: no rashes, lesions, ulcers. No induration. Feet are cool to touch. Neurologic: CN 2-12 grossly intact. Sensation intact,  Strength 5/5 in all 4.  Psychiatric: Normal judgment and insight. Alert and oriented x 3. Normal mood.  Labs on Admission: I have personally reviewed following labs and imaging studies  CBC: No results for input(s): WBC, NEUTROABS, HGB, HCT, MCV, PLT in the last 168 hours. Basic Metabolic Panel: No results for input(s): NA, K, CL, CO2, GLUCOSE, BUN, CREATININE, CALCIUM, MG, PHOS in the last 168 hours. GFR: CrCl cannot be calculated (Patient's most recent lab result is older than the maximum 21 days allowed.). Liver Function Tests: No results for input(s): AST, ALT, ALKPHOS, BILITOT, PROT, ALBUMIN in the last 168 hours. No results for input(s): LIPASE, AMYLASE in the last 168 hours. No results for input(s): AMMONIA in the last 168 hours. Coagulation Profile: No results for input(s): INR, PROTIME in  the last 168 hours. Cardiac Enzymes: No results for input(s): CKTOTAL, CKMB, CKMBINDEX, TROPONINI in the last 168 hours. BNP (last 3 results) No results for input(s): PROBNP in the last 8760 hours. HbA1C: No results for input(s): HGBA1C in the last 72 hours. CBG: Recent Labs  Lab 08/22/20 2111  GLUCAP 70   Lipid Profile: No results for input(s): CHOL, HDL, LDLCALC, TRIG, CHOLHDL, LDLDIRECT in the last 72 hours. Thyroid Function Tests: No results for input(s): TSH, T4TOTAL, FREET4, T3FREE, THYROIDAB in the last 72 hours. Anemia Panel: No results for input(s): VITAMINB12, FOLATE, FERRITIN, TIBC, IRON, RETICCTPCT in the last 72 hours. Urine analysis:    Component Value Date/Time   COLORURINE YELLOW 08/01/2020 2209   APPEARANCEUR CLEAR 08/01/2020 2209   LABSPEC 1.009 08/01/2020 2209   PHURINE 6.0 08/01/2020 2209   GLUCOSEU NEGATIVE 08/01/2020 2209   GLUCOSEU NEGATIVE 11/15/2014 1003   HGBUR NEGATIVE 08/01/2020 2209   BILIRUBINUR NEGATIVE 08/01/2020 2209   KETONESUR NEGATIVE 08/01/2020 2209   PROTEINUR 30 (A) 08/01/2020 2209   UROBILINOGEN 0.2 11/15/2014 1003   NITRITE NEGATIVE 08/01/2020 2209   LEUKOCYTESUR NEGATIVE 08/01/2020 2209    Radiological Exams on Admission: No results found.  EKG: Independently reviewed. No EKG since 08/01/20  Assessment/Plan Active Problems:   Septic arthritis of shoulder (HCC)   Osteomyelitis of right humerus (HCC)   Essential hypertension   Hyponatremia   Generalized anxiety disorder   Chronic systolic congestive heart failure, NYHA class 3 (HCC)   Diabetes mellitus type II, non insulin dependent (HCC)   Acute renal failure (ARF) (HCC)   Hyperlipidemia   BPH (benign prostatic hyperplasia)   Restless leg syndrome   Hyperlipidemia associated with type 2 diabetes mellitus ([place])    1. Septic arthritis - bilateral/osteomyelitis humerus. He has had multiple procedures and aspirations. Continues to have infection and osteomyelitis dx'd at  last admission. Dr. [name] recommends repeat MRI shoulders to assess for osteomyelitis, joint damage, fluid collection. Plan Continue IV cefepime - will ask pharmacy to adjust dose based on ordered labs  MRI shoulders  Continue home pain meds.  ID to follow  2. Acute renal failure - per Dr. Lucianne Lei Dam's 08/21/20 note but no recent labs seen. He was concerned for prerenal azotemia Plan Cmet  Gentle hydration with NS  F/u Bmet in AM  3. Hyponatremia - a chronic problem over the past weeks. Plan IV NS  F/u Bmet  4. HTN- continue home meds  5. DM - will continue home meds plus sliding scale  6. Chronic systolic heart failure - last Echo 07/10/20  EF 50-55% Plan Cmet pending - if renal function OK will continue present dose of lasix and cardiac meds.   7. HLD - continue home meds.  8. GAD - patient with complex psych history including anxiety, PTSD,  paranois, depression Plan Continue home meds  9. Restless leg syndrome - continue home meds.   10. BPH - continue home meds  DVT prophylaxis: lovenox  Code Status: full code  Family Communication: called [name], SO. Disposition Plan: TBD Consults called: Ortho-Dr. Onnie Graham to be called; ID in-patient team  Admission status: inpatient    Adella Hare MD [facility] Pager [phone]  If 7PM-7AM, please contact night-coverage [email or web address] Password [facility]  08/22/2020, 10:53 PM

## 2020-08-23 ENCOUNTER — Inpatient Hospital Stay (HOSPITAL_COMMUNITY): Payer: Medicare Other

## 2020-08-23 ENCOUNTER — Other Ambulatory Visit: Payer: Self-pay

## 2020-08-23 DIAGNOSIS — Z8619 Personal history of other infectious and parasitic diseases: Secondary | ICD-10-CM

## 2020-08-23 DIAGNOSIS — M00819 Arthritis due to other bacteria, unspecified shoulder: Secondary | ICD-10-CM | POA: Diagnosis not present

## 2020-08-23 DIAGNOSIS — N179 Acute kidney failure, unspecified: Secondary | ICD-10-CM | POA: Diagnosis not present

## 2020-08-23 DIAGNOSIS — G9340 Encephalopathy, unspecified: Secondary | ICD-10-CM

## 2020-08-23 DIAGNOSIS — M00812 Arthritis due to other bacteria, left shoulder: Secondary | ICD-10-CM

## 2020-08-23 DIAGNOSIS — M00811 Arthritis due to other bacteria, right shoulder: Secondary | ICD-10-CM

## 2020-08-23 DIAGNOSIS — M86221 Subacute osteomyelitis, right humerus: Secondary | ICD-10-CM

## 2020-08-23 LAB — BASIC METABOLIC PANEL
Anion gap: 12 (ref 5–15)
BUN: 86 mg/dL — ABNORMAL HIGH (ref 8–23)
CO2: 15 mmol/L — ABNORMAL LOW (ref 22–32)
Calcium: 9.1 mg/dL (ref 8.9–10.3)
Chloride: 103 mmol/L (ref 98–111)
Creatinine, Ser: 4.15 mg/dL — ABNORMAL HIGH (ref 0.61–1.24)
GFR, Estimated: 14 mL/min — ABNORMAL LOW (ref >60)
Glucose, Bld: 104 mg/dL — ABNORMAL HIGH (ref 70–99)
Potassium: 5 mmol/L (ref 3.5–5.1)
Sodium: 130 mmol/L — ABNORMAL LOW (ref 135–145)

## 2020-08-23 LAB — GLUCOSE, CAPILLARY
Glucose-Capillary: 91 mg/dL (ref 70–99)
Glucose-Capillary: 97 mg/dL (ref 70–99)
Glucose-Capillary: 103 mg/dL — ABNORMAL HIGH (ref 70–99)
Glucose-Capillary: 95 mg/dL (ref 70–99)
Glucose-Capillary: 92 mg/dL (ref 70–99)
Glucose-Capillary: 107 mg/dL — ABNORMAL HIGH (ref 70–99)

## 2020-08-23 LAB — BLOOD GAS, ARTERIAL
Acid-base deficit: 12 mmol/L — ABNORMAL HIGH (ref 0.0–2.0)
Acid-base deficit: 13 mmol/L — ABNORMAL HIGH (ref 0.0–2.0)
Bicarbonate: 12.7 mmol/L — ABNORMAL LOW (ref 20.0–28.0)
Bicarbonate: 13.4 mmol/L — ABNORMAL LOW (ref 20.0–28.0)
Drawn by: 213381
Drawn by: 331001
FIO2: 21
FIO2: 21
O2 Saturation: 95.6 %
O2 Saturation: 96.5 %
Patient temperature: 36.7
Patient temperature: 37
pCO2 arterial: 29.1 mmHg — ABNORMAL LOW (ref 32.0–48.0)
pCO2 arterial: 29.2 mmHg — ABNORMAL LOW (ref 32.0–48.0)
pH, Arterial: 7.259 — ABNORMAL LOW (ref 7.350–7.450)
pH, Arterial: 7.284 — ABNORMAL LOW (ref 7.350–7.450)
pO2, Arterial: 84.9 mmHg (ref 83.0–108.0)
pO2, Arterial: 90.7 mmHg (ref 83.0–108.0)

## 2020-08-23 LAB — LACTIC ACID, PLASMA: Lactic Acid, Venous: 0.7 mmol/L (ref 0.5–1.9)

## 2020-08-23 LAB — C-REACTIVE PROTEIN: CRP: 1.2 mg/dL — ABNORMAL HIGH (ref <1.0)

## 2020-08-23 LAB — SEDIMENTATION RATE: Sed Rate: 60 mm/hr — ABNORMAL HIGH (ref 0–16)

## 2020-08-23 LAB — SARS CORONAVIRUS 2 (TAT 6-24 HRS): SARS Coronavirus 2: NEGATIVE

## 2020-08-23 MED ORDER — SODIUM BICARBONATE 8.4 % IV SOLN
50.0000 meq | Freq: Once | INTRAVENOUS | Status: AC
  Administered 2020-08-23: 50 meq via INTRAVENOUS
  Filled 2020-08-23: qty 50

## 2020-08-23 MED ORDER — SODIUM CHLORIDE 0.9 % IV BOLUS: 500.0000 mL | Freq: Once | INTRAVENOUS | Status: DC

## 2020-08-23 MED ORDER — LACTATED RINGERS IV SOLN: INTRAVENOUS | Status: AC

## 2020-08-23 MED ORDER — SODIUM BICARBONATE 650 MG PO TABS
650.0000 mg | ORAL_TABLET | Freq: Two times a day (BID) | ORAL | Status: DC
  Filled 2020-08-23: qty 1

## 2020-08-23 MED ORDER — GABAPENTIN 400 MG PO CAPS: 400.0000 mg | ORAL_CAPSULE | Freq: Two times a day (BID) | ORAL | Status: DC

## 2020-08-23 NOTE — Progress Notes (Signed)
Attempted to give report 2x.

## 2020-08-23 NOTE — Plan of Care (Signed)
  Problem: Pain Managment: Goal: General experience of comfort will improve Outcome: Progressing   Problem: Safety: Goal: Ability to remain free from injury will improve Outcome: Progressing   Problem: Skin Integrity: Goal: Risk for impaired skin integrity will decrease Outcome: Progressing   

## 2020-08-23 NOTE — Significant Event (Addendum)
Rapid Response Event Note   Reason for Call :  Change in LOC  Initial Focused Assessment:  Called by the RN because she was worried about her patient.  She stated that the patient has progressively become more lethargic throughout the day.  He was responsive to voice but will quickly drift back asleep.  The patient has intermittent myoclonic jerks in his upper extremetis.   I worry that this patient is becoming more septic and needs closer monitoring.     Interventions:  Arterial blood gas obtained, vitals taken, and fluid bolus given. MD ordered lactic acid, bicarb amp, 500 cc fluid bolus, repeat abg, and transfer patient to progressive care  Plan of Care:  Patient will be transferred to progressive care unit   Event Summary:   MD Notified: Pahwani Call Time: [place] Time: 1805 End Time: 1920  Venetia Maxon, RN

## 2020-08-23 NOTE — Plan of Care (Signed)
  Problem: Pain Managment: Goal: General experience of comfort will improve Outcome: Progressing   Problem: Skin Integrity: Goal: Risk for impaired skin integrity will decrease Outcome: Progressing   Problem: Safety: Goal: Ability to remain free from injury will improve Outcome: Progressing   

## 2020-08-23 NOTE — Progress Notes (Signed)
Patient transferred to 2C-09 on bed with RN and NT.

## 2020-08-23 NOTE — Consult Note (Signed)
[place] for Infectious Disease    Date of Admission:  08/22/2020     Reason for Consult: Shoulder infection     Referring Physician: Dr Doristine Bosworth  Current antibiotics: Cefepime  ASSESSMENT:    History of group B streptococcal bacteremia and left shoulder septic arthritis Status post arthroscopy with washout 07/10/2020.  History of Pseudomonas right shoulder septic arthritis, osteomyelitis of the humerus/AC, and rotator cuff myositis  Status post arthroscopic lavage and extensive debridement 07/17/2020.  He has been on cefepime for several weeks to treat both of the above infections with a planned 8-week course of therapy that was supposed end later this month.  However, he has been admitted with concern for ongoing infection, worsening renal function, and possible orthopedic surgery intervention.  Encephalopathy Most likely uremia related to worsening renal function given rising creatinine and BUN.  Cefepime can also cause an encephalopathy, however, feel that this would be less likely and we will renally adjust his antibiotics.  Acute renal failure Unclear etiology.  Possible prerenal azotemia   PLAN:    -- Continue renally dosed cefepime -- Await MRIs of shoulders -- Orthopedic surgery evaluation pending -- Check ESR -- We will follow   MEDICATIONS:    Scheduled Meds: . ARIPiprazole  5 mg Oral Daily  . buPROPion  300 mg Oral Daily  . carvedilol  3.125 mg Oral BID WC  . celecoxib  200 mg Oral BID  . Chlorhexidine Gluconate Cloth  6 each Topical Daily  . ferrous sulfate  325 mg Oral Daily  . furosemide  20 mg Oral BID  . gabapentin  400 mg Oral BID WC  . gabapentin  800 mg Oral QHS  . insulin aspart  0-9 Units Subcutaneous TID WC  . isosorbide mononitrate  60 mg Oral Daily  . linagliptin  5 mg Oral Daily  . pantoprazole  80 mg Oral Daily  . rOPINIRole  1 mg Oral BID  . sacubitril-valsartan  1 tablet Oral BID  . senna  1 tablet Oral BID  . tamsulosin   0.4 mg Oral BID  . ticagrelor  90 mg Oral BID    Continuous Infusions: . sodium chloride Stopped (08/23/20 0326)  . ceFEPime (MAXIPIME) IV      PRN Meds: acetaminophen, albuterol, clonazePAM, nitroGLYCERIN, sodium chloride flush  HPI:    [name] is a 75 y.o. male with a recent complicated history of group B streptococcus bacteremia and septic shock from a septic left shoulder status post incision and drainage when hospitalized at [facility] 07/09/20-08/01/20.  His right shoulder subsequently became infected which grew Pseudomonas from an aspirate that same admission.    He is followed outpatient by Dr. [name] who had instructed patient to present to the hospital for direct admission due to concern for ongoing infection, worsening renal function, and possible need for surgical intervention.    He has been on antibiotics since 07/09/2020 when he was initially admitted with sepsis.  He has been on cefepime since the Pseudomonas was recovered from his right shoulder.  He had an MRI of the right showing septic joint along with osteomyelitis involving the humerus and AC joint on 07/15/2020.  He has been following with Dr. Onnie Graham as well as an outpatient.  In the interim, he has aspirated both shoulders which continue to show elevated white blood cells with neutrophil predominance.  He presented on January 6 to the ID clinic for follow-up and was subsequently directly admitted to the hospital with  a bed becoming available overnight.  His outpatient labs have shown worsening creatinine from 1.2 up to 2.1.  His inflammatory markers also remain high with a sed rate of 70 and CRP of 15 on outpatient labs.  He has been started on cefepime renally dosed and MRIs have been ordered of both shoulders.  He is awaiting orthopedic surgery evaluation and we have been consulted for further antibiotic recommendations.  Patient's labs on admission notable for worsening acute kidney injury with a creatinine of 4.1,  BUN 86, sodium 129, potassium 5.3.  Normal white blood cell count.  BNP elevated at 1652.  CRP is 1.2.  ESR not obtained at this time.  He is afebrile with otherwise normal vitals.   Past Medical History:  Diagnosis Date  . Acute renal failure (ARF) ([place]) 08/21/2020  . Anemia    NOS  . Anxiety   . 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 BPH (benign prostatic hypertrophy)   . Broken foot Oct. 12, 2013   Right foot Fx  . CAD (coronary artery disease)   . Carotid stenosis, left   . Cellulitis and abscess of leg 03/2018   right leg  . Chronic hyponatremia   . Chronic pain syndrome   . COPD (chronic obstructive pulmonary disease) ([place])   . Depression   . Diabetes mellitus   . Diabetic foot ulcer ([place])   . DJD (degenerative joint disease)    L wrist  . DM2 (diabetes mellitus, type 2) ([place])   . GERD (gastroesophageal reflux disease)   . HLD (hyperlipidemia)   . HTN (hypertension)   . Hx of blood transfusion reaction   . Hyponatremia   . Lumbar disc disease   . MI (mitral incompetence)   . Myocardial infarction ([place])    x 2  . Osteoporosis    tx per [facility], Dr Prudencio Burly, thought due to heavy steriod use after 1978  . Peripheral neuropathy   . Peripheral vascular disease ([place])   . Restless leg syndrome   . Spine fracture    hx, multiple  . Varicose veins   . 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 Vitamin D deficiency     Social History   Tobacco Use  . Smoking status: Former Smoker    Packs/day: 1.50    Years: 30.00    Pack years: 45.00    Types: Cigarettes    Quit date: 11/06/1988    Years since quitting: 31.8  . Smokeless tobacco: Never Used  Vaping Use  . Vaping Use: Never used  Substance Use Topics  . Alcohol use: Not Currently    Comment: None in 30 years but drank beer before- 05/09/20  . Drug use: No    Family History  Problem Relation Age of Onset  . Lung cancer Father 61  . Diabetes Mother 48  . Osteoporosis Mother   .  Depression Mother   . Bleeding Disorder Mother   . Throat cancer Other        family hx - "bad living" - also lung CA, heart disease and renal failure    . Diabetes Sister   . Suicidality Son 69  . Lung cancer Maternal Grandmother 4  . Diabetes Maternal Grandfather 43  . Heart disease Maternal Grandfather   . Heart attack Paternal Grandmother   . Brain cancer Paternal Grandfather 50    Allergies  Allergen Reactions  . Ivp Dye [Iodinated  Diagnostic Agents]     Blood Pressure dropped----pt was pre-medicated with 13 hour prep and did fine with pre-meds--amy 03/09/11   . Sulfa Antibiotics Hives  . Sulfonamide Derivatives Hives and Itching  . Lisinopril Cough  . Metoprolol Nausea And Vomiting  . Statins Other (See Comments)    myalgias  . Propoxyphene N-Acetaminophen Other (See Comments)    Sharp pains- headache    Review of Systems  Unable to perform ROS: Mental status change    OBJECTIVE:   Blood pressure (!) 139/57, pulse 79, temperature (!) 97.3 F (36.3 C), temperature source Oral, resp. rate 17, height 6' (1.829 m), weight 89.3 kg, SpO2 100 %. Body mass index is 26.7 kg/m.  Physical Exam Constitutional:      Comments: Chronically ill-appearing, elderly man, lying in bed, no acute distress.  HENT:     Head: Normocephalic and atraumatic.     Right Ear: External ear normal.     Left Ear: External ear normal.     Nose: Nose normal.     Mouth/Throat:     Mouth: Mucous membranes are moist.     Pharynx: Oropharynx is clear.  Eyes:     General: No scleral icterus.    Extraocular Movements: Extraocular movements intact.     Conjunctiva/sclera: Conjunctivae normal.  Cardiovascular:     Rate and Rhythm: Normal rate.     Comments: Tunneled right IJ CVC. Pulmonary:     Effort: Pulmonary effort is normal. No respiratory distress.  Abdominal:     General: There is no distension.     Palpations: Abdomen is soft.     Tenderness: There is no abdominal tenderness.   Musculoskeletal:     Comments: PICC line in place. Limited exam of bilateral shoulders reveals no obvious warmth or erythema.  Range of motion limited but did not elicit significant discomfort or pain.  Skin:    General: Skin is warm and dry.     Findings: No erythema.  Neurological:     General: No focal deficit present.     Mental Status: He is disoriented.     Lab Results & Microbiology Lab Results  Component Value Date   WBC 6.8 08/22/2020   HGB 9.7 (L) 08/22/2020   HCT 29.6 (L) 08/22/2020   MCV 88.9 08/22/2020   PLT 242 08/22/2020    Lab Results  Component Value Date   NA 130 (L) 08/23/2020   K 5.0 08/23/2020   CO2 15 (L) 08/23/2020   GLUCOSE 104 (H) 08/23/2020   BUN 86 (H) 08/23/2020   CREATININE 4.15 (H) 08/23/2020   CALCIUM 9.1 08/23/2020   GFRNONAA 14 (L) 08/23/2020   GFRAA 45 (L) 05/04/2020    Lab Results  Component Value Date   ALT 16 08/22/2020   AST 17 08/22/2020   ALKPHOS 106 08/22/2020   BILITOT 0.7 08/22/2020    C-Reactive Protein     Component Value Date/Time   CRP 1.2 (H) 08/22/2020 2201    Erythrocyte Sedimentation Rate     Component Value Date/Time   ESRSEDRATE 150 (H) 07/09/2020 2302      I have reviewed the micro and lab results in Epic.  Imaging No results found.    [facility] for Infectious Disease [facility] [phone] pager 08/23/2020, 9:04 AM

## 2020-08-23 NOTE — Progress Notes (Signed)
   08/23/20 1831  Assess: MEWS Score  Temp 98.5 F (36.9 C)  BP (!) 77/51  Pulse Rate 96  Assess: MEWS Score  MEWS Temp 0  MEWS Systolic 2  MEWS Pulse 0  MEWS RR 0  MEWS LOC 0  MEWS Score 2  MEWS Score Color Yellow  Assess: if the MEWS score is Yellow or Red  Were vital signs taken at a resting state? Yes  Focused Assessment Change from prior assessment (see assessment flowsheet)  Early Detection of Sepsis Score *See Row Information* Low  MEWS guidelines implemented *See Row Information* Yes  Treat  MEWS Interventions Escalated (See documentation below)  Take Vital Signs  Increase Vital Sign Frequency  Yellow: Q 2hr X 2 then Q 4hr X 2, if remains yellow, continue Q 4hrs  Escalate  MEWS: Escalate Yellow: discuss with charge nurse/RN and consider discussing with provider and RRT  Notify: Charge Nurse/RN  Name of Charge Nurse/RN Notified Caren Griffins RN  Date Charge Nurse/RN Notified 08/23/20  Time Charge Nurse/RN Notified 1841  Notify: Provider  Provider Name/Title Darliss Cheney  Date Provider Notified 08/23/20  Time Provider Notified 1841  Notification Type Page  Notification Reason Change in status  Notify: Rapid Response  Name of Rapid Response RN Notified Saralyn Pilar RN  Date Rapid Response Notified 08/23/20  Time Rapid Response Notified 1800  Document  Patient Outcome Transferred/level of care increased

## 2020-08-23 NOTE — Consult Note (Signed)
Nephrology Consult   Requesting provider: Darliss Cheney Service requesting consult: Hospitalist Reason for consult: AKI   Assessment/Recommendations: [name] is a/an 75 y.o. male with a past medical history HFrEF, DM 2, neuropathy, PAD, CAD, chronic osteomyelitis who presents with AKI and AMS  AKI: Unclear cause at this time.  baseline creatinine normal.  Rising on 1/4.  Now 4.1 stable from yesterday.  Renal ultrasound without hydronephrosis but kidneys noted to be enlarged concerning for nephritis per read.  Notably he was on NSAIDs even after admission as well as Lasix and Entresto.  Appears slightly dehydrated to me on exam today -Obtain urinalysis -Could consider steroids if AIN is felt to be most likely but hold off for now; NSAID and/or cefepime could be possible culprits -LR 100 cc/h for 10 hours; monitor for hypoxia -No urgent need for dialysis at this time -Dose medications appropriately as below -Continue to monitor daily Cr, Dose meds for GFR -Monitor Daily I/Os, Daily weight  -Maintain MAP>65 for optimal renal perfusion.  -Avoid nephrotoxic medications including NSAIDs and Vanc/Zosyn combo  Altered mental status: Altered on my exam but protecting his airway.  Likely multifactorial but several medications could be contributing given his decreased GFR -Stop gabapentin and benzodiazepines -Cefepime neurotoxicity is a possibility especially given he was likely receiving higher doses at home and appropriate for his GFR -Could consider switching cefepime if mental status does not improve soon -Uremia also possibly contributing but feel medication side effects most likely  Hyponatremia: Likely related to free water retention in the setting of AKI.  Continue to monitor.  Level 366 today  Metabolic acidosis: Likely associated with AKI.  Switch fluids from normal saline to lactated Ringer's.  Start oral sodium bicarb  Osteomyelitis: And shoulders.  Currently on cefepime.  ID  following.  Work-up ongoing while in the hospital  CHF/HTN: Reduced ejection fraction.  Can continue Imdur and carvedilol.  Hold Entresto for now.  Gentle fluids as above and monitor closely    Recommendations conveyed to primary service.    [facility] 08/23/2020 1:12 PM   _____________________________________________________________________________________ CC: AKI  History of Present Illness: [name] is a/an 75 y.o. male with a past medical history of HFrEF, DM 2, neuropathy, PAD, CAD, chronic osteomyelitis who presents with AKI and confusion.  The patient was altered on my interview so history was obtained per chart review.  The patient was recently hospitalized for osteomyelitis of both his shoulders related to GBS and Pseudomonas.  The patient had septic shock at that time.  He was discharged on 08/01/2020 with plans to complete 8 weeks of cefepime 2 g 3 times daily.  Patient was seen by infectious disease on 08/21/2020 and it was recommended he be sent to the hospital because of elevated creatinine.  His baseline creatinine was 1 last normal on 12/17.  Creatinine was 2.5 on 1/4.  Creatinine was 4.1 on arrival.  Patient reported to staff that he was able to eat and drink and had been taking his medications.  He had ongoing shoulder pain.  Of note, on arrival the patient was taking Celebrex twice daily.  This was continued on admission.  Unclear if he was taking other NSAIDs at home.  Patient was also taking Lasix and Entresto which were also continued on admission.   Medications:  Current Facility-Administered Medications  Medication Dose Route Frequency Provider Last Rate Last Admin  . acetaminophen (TYLENOL) tablet 1,000 mg  1,000 mg Oral Q6H PRN  Norins, Heinz Knuckles, MD   1,000 mg at 08/23/20 0931  . albuterol (VENTOLIN HFA) 108 (90 Base) MCG/ACT inhaler 2 puff  2 puff Inhalation Q6H PRN Norins, Heinz Knuckles, MD      . ARIPiprazole (ABILIFY)  tablet 5 mg  5 mg Oral Daily Norins, Heinz Knuckles, MD      . buPROPion (WELLBUTRIN XL) 24 hr tablet 300 mg  300 mg Oral Daily Norins, Heinz Knuckles, MD      . carvedilol (COREG) tablet 3.125 mg  3.125 mg Oral BID WC Norins, Heinz Knuckles, MD      . ceFEPIme (MAXIPIME) 2 g in sodium chloride 0.9 % 100 mL IVPB  2 g Intravenous Q24H Franky Macho, RPH 200 mL/hr at 08/23/20 1133 2 g at 08/23/20 1133  . Chlorhexidine Gluconate Cloth 2 % PADS 6 each  6 each Topical Daily Norins, Heinz Knuckles, MD      . ferrous sulfate tablet 325 mg  325 mg Oral Daily Norins, Heinz Knuckles, MD      . insulin aspart (novoLOG) injection 0-9 Units  0-9 Units Subcutaneous TID WC Norins, Heinz Knuckles, MD      . isosorbide mononitrate (IMDUR) 24 hr tablet 60 mg  60 mg Oral Daily Norins, Heinz Knuckles, MD      . lactated ringers infusion   Intravenous Continuous Reesa Chew, MD      . nitroGLYCERIN (NITROSTAT) SL tablet 0.4 mg  0.4 mg Sublingual Q5 min PRN Norins, Heinz Knuckles, MD      . pantoprazole (PROTONIX) EC tablet 80 mg  80 mg Oral Daily Norins, Heinz Knuckles, MD      . rOPINIRole (REQUIP) tablet 1 mg  1 mg Oral BID Norins, Heinz Knuckles, MD   1 mg at 08/22/20 2314  . senna (SENOKOT) tablet 8.6 mg  1 tablet Oral BID Neena Rhymes, MD   8.6 mg at 08/22/20 2314  . sodium chloride flush (NS) 0.9 % injection 10-40 mL  10-40 mL Intracatheter PRN Norins, Heinz Knuckles, MD      . tamsulosin [facility]) capsule 0.4 mg  0.4 mg Oral BID Norins, Heinz Knuckles, MD   0.4 mg at 08/22/20 2314  . ticagrelor (BRILINTA) tablet 90 mg  90 mg Oral BID Neena Rhymes, MD   90 mg at 08/22/20 2315     ALLERGIES Ivp dye [iodinated diagnostic agents], Sulfa antibiotics, Sulfonamide derivatives, Lisinopril, Metoprolol, Statins, and Propoxyphene n-acetaminophen  MEDICAL HISTORY Past Medical History:  Diagnosis Date  . Acute renal failure (ARF) ([place]) 08/21/2020  . Anemia    NOS  . Anxiety   . 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 BPH (benign prostatic  hypertrophy)   . Broken foot Oct. 12, 2013   Right foot Fx  . CAD (coronary artery disease)   . Carotid stenosis, left   . Cellulitis and abscess of leg 03/2018   right leg  . Chronic hyponatremia   . Chronic pain syndrome   . COPD (chronic obstructive pulmonary disease) ([place])   . Depression   . Diabetes mellitus   . Diabetic foot ulcer ([place])   . DJD (degenerative joint disease)    L wrist  . DM2 (diabetes mellitus, type 2) ([place])   . GERD (gastroesophageal reflux disease)   . HLD (hyperlipidemia)   . HTN (hypertension)   . Hx of blood transfusion reaction   . Hyponatremia   . Lumbar disc disease   .  MI (mitral incompetence)   . Myocardial infarction ([place])    x 2  . Osteoporosis    tx per [facility], Dr Prudencio Burly, thought due to heavy steriod use after 1978  . Peripheral neuropathy   . Peripheral vascular disease ([place])   . Restless leg syndrome   . Spine fracture    hx, multiple  . Varicose veins   . 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 Vitamin D deficiency      SOCIAL HISTORY Social History   Socioeconomic History  . Marital status: Divorced    Spouse name: Not on file  . Number of children: Not on file  . Years of education: Not on file  . Highest education level: Not on file  Occupational History  . Not on file  Tobacco Use  . Smoking status: Former Smoker    Packs/day: 1.50    Years: 30.00    Pack years: 45.00    Types: Cigarettes    Quit date: 11/06/1988    Years since quitting: 31.8  . Smokeless tobacco: Never Used  Vaping Use  . Vaping Use: Never used  Substance and Sexual Activity  . Alcohol use: Not Currently    Comment: None in 30 years but drank beer before- 05/09/20  . Drug use: No  . Sexual activity: Not Currently  Other Topics Concern  . Not on file  Social History Narrative   Social History      Diet? I eat about what I want.       Do you drink/eat things with caffeine? yes      Marital status?                  divorced                   What year were you married? 1966 to 2007      Do you live in a house, apartment, assisted living, condo, trailer, etc.? [place]      Is it one or more stories? one      How many persons live in your home? Me only      Do you have any pets in your home? (please list) no      Highest level of education completed? Finished high school      Current or past profession: Clinical biochemist in [place] you exercise?               no                       Type & how often?      Advanced Directives      Do you have a living will? no      Do you have a DNR form?     no                             If not, do you want to discuss one?      Do you have signed POA/HPOA for forms? No      Functional Status      Do you have difficulty bathing or dressing yourself? No- shower      Do you have difficulty preparing food or eating? No- Don't want to      Do you have difficulty managing your medications?  Do you have difficulty managing your finances?      Do you have difficulty affording your medications? No- paid for by [place] Strain: Not on [employer] Insecurity: Not on file  Transportation Needs: Not on file  Physical Activity: Not on file  Stress: Not on file  Social Connections: Not on file  Intimate Partner Violence: Not on file     FAMILY HISTORY Family History  Problem Relation Age of Onset  . Lung cancer Father 75  . Diabetes Mother 53  . Osteoporosis Mother   . Depression Mother   . Bleeding Disorder Mother   . Throat cancer Other        family hx - "bad living" - also lung CA, heart disease and renal failure    . Diabetes Sister   . Suicidality Son 43  . Lung cancer Maternal Grandmother 39  . Diabetes Maternal Grandfather 35  . Heart disease Maternal Grandfather   . Heart attack Paternal Grandmother   . Brain cancer Paternal Grandfather 8      Review of Systems: Unable to  obtain review of systems due to patient's altered mental status Otherwise as per HPI, all other systems reviewed and negative  Physical Exam: Vitals:   08/23/20 0434 08/23/20 1140  BP: (!) 139/57 (!) 148/56  Pulse: 79   Resp: 17 16  Temp: (!) 97.3 F (36.3 C) 98 F (36.7 C)  SpO2: 100% 99%   No intake/output data recorded.  Intake/Output Summary (Last 24 hours) at 08/23/2020 1312 Last data filed at 08/23/2020 0326 Gross per 24 hour  Intake --  Output 400 ml  Net -400 ml   General: Ill-appearing, lying in bed, no distress HEENT: anicteric sclera, oropharynx clear without lesions CV: Normal rate, no obvious murmurs, no peripheral edema Lungs: Bilateral chest rise, normal work of breathing Abd: soft, non-tender, non-distended Skin: no visible lesions or rashes Psych: Tired, appropriate mood Musculoskeletal: Shoulders minimally mobile, pain with palpation Neuro: Lethargic, protecting airway, not oriented to place or time, unable to answer questions  Test Results Reviewed Lab Results  Component Value Date   NA 130 (L) 08/23/2020   K 5.0 08/23/2020   CL 103 08/23/2020   CO2 15 (L) 08/23/2020   BUN 86 (H) 08/23/2020   CREATININE 4.15 (H) 08/23/2020   GFR 118.86 03/19/2015   GLU 116 12/01/2017   CALCIUM 9.1 08/23/2020   ALBUMIN 2.6 (L) 08/22/2020   PHOS 3.2 07/18/2020     I have reviewed all relevant outside healthcare records related to the patient's current hospitalization

## 2020-08-23 NOTE — Progress Notes (Signed)
PROGRESS NOTE    [name]  WUJ:[id number] DOB: 12/13/1945 DOA: 08/22/2020 PCP: Doreatha Lew, MD   Brief Narrative:  HPI: [name] is a 75 y.o. male with medical history significant of multiple medical problems including chronic systolic heart failure, DM with completication of neuropathy, PAD, ASVD diffuse. He has a h/o chronic shoulder problems. He was admitted 07/09/20-08/01/20 for septic arthritis both shoulders with GBS and Pseudomonas with septic shock. He was also found to have osteomyelitis right humerus. A PICC line was placed and the patient was discharged on 8 weeks of cefipeme 2g TID to end January 28th.  He was seen by Dr. [name] for ID follow-up 08/21/20. At that encounter Dr. [name] recommended the patient be admitted for continue care, management of renal failure and continued orthopedic consultation. It took until late on the day of admission, 08/22/20, for a bed to be available.   Patient reports he has been able to eat and drink. He has been taking his medications. He reports that both shoulders cause him a lot of pain.     ED Course: Patient not seen in ED - direct admit.  Assessment & Plan:   Principal Problem:   Septic arthritis of shoulder ([place]) Active Problems:   Hyperlipidemia   Essential hypertension   BPH (benign prostatic hyperplasia)   Hyponatremia   Generalized anxiety disorder   Restless leg syndrome   Chronic systolic congestive heart failure, NYHA class 3 (HCC)   Hyperlipidemia associated with type 2 diabetes mellitus (HCC)   Diabetes mellitus type II, non insulin dependent (HCC)   Acute renal failure (ARF) (HCC)   Osteomyelitis of right humerus (HCC)   Encephalopathy acute   1. Septic arthritis - bilateral shoulder/osteomyelitis humerus. He has had multiple procedures and aspirations.  Complains of shoulder pain.  MRI bilateral shoulder ordered and pending.  Continue cefepime.  ID on board.  Dr. [name] of ID had talked to Dr.  Onnie Graham yesterday and I understand that they will be seeing this patient here as well.  I have sent Dr. Onnie Graham secure chat as well.  2. Acute renal failure: Patient's renal function was completely normal just 3 weeks ago.  Comes in with a creatinine of 4.13.  Could very well be due to ongoing sepsis.  Nephrology has been consulted and I have ordered ultrasound renal.  Continue to hold nephrotoxic agents which includes Entresto and renally adjust medications.  3.  Chronic hyponatremia: At baseline.  Monitor.  4. HTN-controlled.  Continue home medications.  5. DM -blood sugar slightly on the low side this morning.  Will hold Tradjenta.  Continue SSI.  6. Chronic systolic heart failure - last Echo 07/10/20  EF 50-55%: Appears euvolemic.  Hold Lasix and Entresto.  7. HLD - continue home meds.  8. GAD - patient with complex psych history including anxiety, PTSD, paranois, depression.  Continue home medications.  9. Restless leg syndrome - continue home meds.   10. BPH - continue home meds  11.  Acute uremic encephalopathy: Patient alert but completely confused.  Likely due to uremia.  Could also be contributed by cefepime but low chances in this particular case.  Nephrology has been consulted.  Will defer to them about any decision of dialysis needs.  DVT prophylaxis:    Code Status: Full Code  Family Communication:  None present at bedside.   Status is: Inpatient  Remains inpatient appropriate because:Inpatient level of care appropriate due to severity of illness   Dispo:  The patient is from: Home              Anticipated d/c is to: Home              Anticipated d/c date is: > 3 days              Patient currently is not medically stable to d/c.        Estimated body mass index is 26.7 kg/m as calculated from the following:   Height as of this encounter: 6' (1.829 m).   Weight as of this encounter: 89.3 kg.      Nutritional status:                Consultants:   ID  Orthopedics  Nephrology  Procedures:   None  Antimicrobials:  Anti-infectives (From admission, onward)   Start     Dose/Rate Route Frequency Ordered Stop   08/23/20 1000  ceFEPIme (MAXIPIME) 2 g in sodium chloride 0.9 % 100 mL IVPB        2 g 200 mL/hr over 30 Minutes Intravenous Every 24 hours 08/22/20 2355     08/22/20 2245  ceFEPime (MAXIPIME) IVPB  Status:  Discontinued       Note to Pharmacy: Indication:  Septic arthritis/osteomyelitis  First Dose: No Last Day of Therapy:  09/12/2020 Labs - Once weekly:  CBC/D and BMP, Labs - Every other week:  ESR and CRP Method of administration: IV PushPharmacy to consult re: dose adjust with renal failure   2 g Intravenous Every 8 hours 08/22/20 2155 08/22/20 2354         Subjective: Patient seen and examined.  He is alert but pleasantly confused.  Did not have any complaint until I asked him about his shoulders and then he endorsed some pain.  Objective: Vitals:   08/22/20 2059 08/22/20 2108 08/22/20 2115 08/23/20 0434  BP:  95/64 (!) 144/65 (!) 139/57  Pulse:  77 77 79  Resp:  _0 Temp:  (!) 97.3 F (36.3 C) 97.7 F (36.5 C) (!) 97.3 F (36.3 C)  TempSrc:  Oral Oral Oral  SpO2:  (!) 70% 100% 100%  Weight: 89.3 kg     Height: 6' (1.829 m)       Intake/Output Summary (Last 24 hours) at 08/23/2020 1116 Last data filed at 08/23/2020 0326 Gross per 24 hour  Intake --  Output 400 ml  Net -400 ml   Filed Weights   08/22/20 2059  Weight: 89.3 kg    Examination:  General exam: Appears calm and comfortable  Respiratory system: Clear to auscultation. Respiratory effort normal. Cardiovascular system: S1 & S2 heard, RRR. No JVD, murmurs, rubs, gallops or clicks. No pedal edema. Gastrointestinal system: Abdomen is nondistended, soft and nontender. No organomegaly or masses felt. Normal bowel sounds heard. Central nervous system: Alert but confused. No focal neurological  deficits. Extremities: Symmetric 5 x 5 power. Skin: No rashes, lesions or ulcers  Data Reviewed: I have personally reviewed following labs and imaging studies  CBC: Recent Labs  Lab 08/22/20 2201  WBC 6.8  NEUTROABS 4.3  HGB 9.7*  HCT 29.6*  MCV 88.9  PLT 599   Basic Metabolic Panel: Recent Labs  Lab 08/22/20 2201 08/23/20 0323  NA 129* 130*  K 5.3* 5.0  CL 101 103  CO2 14* 15*  GLUCOSE 87 104*  BUN 86* 86*  CREATININE 4.13* 4.15*  CALCIUM 9.4 9.1   GFR: Estimated Creatinine Clearance: 17.1  mL/min (A) (by C-G formula based on SCr of 4.15 mg/dL (H)). Liver Function Tests: Recent Labs  Lab 08/22/20 2201  AST 17  ALT 16  ALKPHOS 106  BILITOT 0.7  PROT 7.3  ALBUMIN 2.6*   No results for input(s): LIPASE, AMYLASE in the last 168 hours. No results for input(s): AMMONIA in the last 168 hours. Coagulation Profile: No results for input(s): INR, PROTIME in the last 168 hours. Cardiac Enzymes: No results for input(s): CKTOTAL, CKMB, CKMBINDEX, TROPONINI in the last 168 hours. BNP (last 3 results) No results for input(s): PROBNP in the last 8760 hours. HbA1C: Recent Labs    08/22/20 2201  HGBA1C 6.5*   CBG: Recent Labs  Lab 08/22/20 2111 08/23/20 0709  GLUCAP 70 91   Lipid Profile: No results for input(s): CHOL, HDL, LDLCALC, TRIG, CHOLHDL, LDLDIRECT in the last 72 hours. Thyroid Function Tests: No results for input(s): TSH, T4TOTAL, FREET4, T3FREE, THYROIDAB in the last 72 hours. Anemia Panel: No results for input(s): VITAMINB12, FOLATE, FERRITIN, TIBC, IRON, RETICCTPCT in the last 72 hours. Sepsis Labs: No results for input(s): PROCALCITON, LATICACIDVEN in the last 168 hours.  Recent Results (from the past 240 hour(s))  SARS CORONAVIRUS 2 (TAT 6-24 HRS) Nasopharyngeal Nasopharyngeal Swab     Status: None   Collection Time: 08/22/20 11:57 PM   Specimen: Nasopharyngeal Swab  Result Value Ref Range Status   SARS Coronavirus 2 NEGATIVE NEGATIVE Final     Comment: (NOTE) SARS-CoV-2 target nucleic acids are NOT DETECTED.  The SARS-CoV-2 RNA is generally detectable in upper and lower respiratory specimens during the acute phase of infection. Negative results do not preclude SARS-CoV-2 infection, do not rule out co-infections with other pathogens, and should not be used as the sole basis for treatment or other patient management decisions. Negative results must be combined with clinical observations, patient history, and epidemiological information. The expected result is Negative.  Fact Sheet for Patients: SugarRoll.be  Fact Sheet for Healthcare Providers: https://www.woods-mathews.com/  This test is not yet approved or cleared by the [place] FDA and  has been authorized for detection and/or diagnosis of SARS-CoV-2 by FDA under an Emergency Use Authorization (EUA). This EUA will remain  in effect (meaning this test can be used) for the duration of the COVID-19 declaration under Se ction 564(b)(1) of the Act, 21 U.S.C. section 360bbb-3(b)(1), unless the authorization is terminated or revoked sooner.  Performed at [facility], [place] [address] [place], [place] [address]       Radiology Studies: No results found.  Scheduled Meds: . ARIPiprazole  5 mg Oral Daily  . buPROPion  300 mg Oral Daily  . carvedilol  3.125 mg Oral BID WC  . Chlorhexidine Gluconate Cloth  6 each Topical Daily  . ferrous sulfate  325 mg Oral Daily  . gabapentin  400 mg Oral BID  . insulin aspart  0-9 Units Subcutaneous TID WC  . isosorbide mononitrate  60 mg Oral Daily  . linagliptin  5 mg Oral Daily  . pantoprazole  80 mg Oral Daily  . rOPINIRole  1 mg Oral BID  . senna  1 tablet Oral BID  . tamsulosin  0.4 mg Oral BID  . ticagrelor  90 mg Oral BID   Continuous Infusions: . sodium chloride Stopped (08/23/20 0326)  . ceFEPime (MAXIPIME) IV       LOS: 1 day   Time spent: 37  minutes   Darliss Cheney, MD [facility]  08/23/2020, 11:16 AM  To contact the attending provider between 7A-7P or the covering provider during after hours 7P-7A, please log into the web site [email or web address].

## 2020-08-24 ENCOUNTER — Inpatient Hospital Stay (HOSPITAL_COMMUNITY): Payer: Medicare Other

## 2020-08-24 DIAGNOSIS — M86221 Subacute osteomyelitis, right humerus: Secondary | ICD-10-CM | POA: Diagnosis not present

## 2020-08-24 DIAGNOSIS — R4182 Altered mental status, unspecified: Secondary | ICD-10-CM

## 2020-08-24 DIAGNOSIS — M00819 Arthritis due to other bacteria, unspecified shoulder: Secondary | ICD-10-CM | POA: Diagnosis not present

## 2020-08-24 DIAGNOSIS — N179 Acute kidney failure, unspecified: Secondary | ICD-10-CM | POA: Diagnosis not present

## 2020-08-24 DIAGNOSIS — Z1621 Resistance to vancomycin: Secondary | ICD-10-CM | POA: Diagnosis not present

## 2020-08-24 DIAGNOSIS — G9340 Encephalopathy, unspecified: Secondary | ICD-10-CM | POA: Diagnosis not present

## 2020-08-24 DIAGNOSIS — J9601 Acute respiratory failure with hypoxia: Secondary | ICD-10-CM | POA: Diagnosis not present

## 2020-08-24 DIAGNOSIS — Z20822 Contact with and (suspected) exposure to covid-19: Secondary | ICD-10-CM | POA: Diagnosis not present

## 2020-08-24 DIAGNOSIS — E872 Acidosis: Secondary | ICD-10-CM

## 2020-08-24 LAB — COMPREHENSIVE METABOLIC PANEL
ALT: 13 U/L (ref 0–44)
AST: 15 U/L (ref 15–41)
Albumin: 2.3 g/dL — ABNORMAL LOW (ref 3.5–5.0)
Alkaline Phosphatase: 100 U/L (ref 38–126)
Anion gap: 15 (ref 5–15)
BUN: 89 mg/dL — ABNORMAL HIGH (ref 8–23)
CO2: 13 mmol/L — ABNORMAL LOW (ref 22–32)
Calcium: 8.8 mg/dL — ABNORMAL LOW (ref 8.9–10.3)
Chloride: 105 mmol/L (ref 98–111)
Creatinine, Ser: 4.55 mg/dL — ABNORMAL HIGH (ref 0.61–1.24)
GFR, Estimated: 13 mL/min — ABNORMAL LOW (ref >60)
Glucose, Bld: 101 mg/dL — ABNORMAL HIGH (ref 70–99)
Potassium: 4.6 mmol/L (ref 3.5–5.1)
Sodium: 133 mmol/L — ABNORMAL LOW (ref 135–145)
Total Bilirubin: 0.7 mg/dL (ref 0.3–1.2)
Total Protein: 6.9 g/dL (ref 6.5–8.1)

## 2020-08-24 LAB — CBC WITH DIFFERENTIAL/PLATELET
Abs Immature Granulocytes: 0.03 10*3/uL (ref 0.00–0.07)
Basophils Absolute: 0.1 10*3/uL (ref 0.0–0.1)
Basophils Relative: 1 %
Eosinophils Absolute: 0.6 10*3/uL — ABNORMAL HIGH (ref 0.0–0.5)
Eosinophils Relative: 10 %
HCT: 32.5 % — ABNORMAL LOW (ref 39.0–52.0)
Hemoglobin: 10.3 g/dL — ABNORMAL LOW (ref 13.0–17.0)
Immature Granulocytes: 1 %
Lymphocytes Relative: 10 %
Lymphs Abs: 0.6 10*3/uL — ABNORMAL LOW (ref 0.7–4.0)
MCH: 28.2 pg (ref 26.0–34.0)
MCHC: 31.7 g/dL (ref 30.0–36.0)
MCV: 89 fL (ref 80.0–100.0)
Monocytes Absolute: 0.7 10*3/uL (ref 0.1–1.0)
Monocytes Relative: 11 %
Neutro Abs: 4.4 10*3/uL (ref 1.7–7.7)
Neutrophils Relative %: 67 %
Platelets: 255 10*3/uL (ref 150–400)
RBC: 3.65 MIL/uL — ABNORMAL LOW (ref 4.22–5.81)
RDW: 17.2 % — ABNORMAL HIGH (ref 11.5–15.5)
WBC: 6.3 10*3/uL (ref 4.0–10.5)
nRBC: 0 % (ref 0.0–0.2)

## 2020-08-24 LAB — URINALYSIS, ROUTINE W REFLEX MICROSCOPIC
Bacteria, UA: NONE SEEN
Bilirubin Urine: NEGATIVE
Glucose, UA: NEGATIVE mg/dL
Ketones, ur: NEGATIVE mg/dL
Nitrite: NEGATIVE
Protein, ur: 100 mg/dL — AB
Specific Gravity, Urine: 1.012 (ref 1.005–1.030)
pH: 6 (ref 5.0–8.0)

## 2020-08-24 LAB — GLUCOSE, CAPILLARY
Glucose-Capillary: 106 mg/dL — ABNORMAL HIGH (ref 70–99)
Glucose-Capillary: 97 mg/dL (ref 70–99)
Glucose-Capillary: 98 mg/dL (ref 70–99)
Glucose-Capillary: 95 mg/dL (ref 70–99)

## 2020-08-24 LAB — BLOOD GAS, ARTERIAL
Acid-base deficit: 10.7 mmol/L — ABNORMAL HIGH (ref 0.0–2.0)
Bicarbonate: 14.4 mmol/L — ABNORMAL LOW (ref 20.0–28.0)
Drawn by: 548791
FIO2: 21
O2 Saturation: 96.6 %
Patient temperature: 37
pCO2 arterial: 30.3 mmHg — ABNORMAL LOW (ref 32.0–48.0)
pH, Arterial: 7.3 — ABNORMAL LOW (ref 7.350–7.450)
pO2, Arterial: 90.8 mmHg (ref 83.0–108.0)

## 2020-08-24 LAB — MAGNESIUM: Magnesium: 2.5 mg/dL — ABNORMAL HIGH (ref 1.7–2.4)

## 2020-08-24 LAB — LACTIC ACID, PLASMA: Lactic Acid, Venous: 0.7 mmol/L (ref 0.5–1.9)

## 2020-08-24 LAB — MRSA PCR SCREENING: MRSA by PCR: NEGATIVE

## 2020-08-24 MED ORDER — METOPROLOL TARTRATE 5 MG/5ML IV SOLN
5.0000 mg | Freq: Four times a day (QID) | INTRAVENOUS | Status: DC
  Administered 2020-08-24 – 2020-08-28 (×19): 5 mg via INTRAVENOUS
  Filled 2020-08-24 (×19): qty 5

## 2020-08-24 MED ORDER — SODIUM BICARBONATE 650 MG PO TABS
1300.0000 mg | ORAL_TABLET | Freq: Three times a day (TID) | ORAL | Status: DC
  Administered 2020-08-28 – 2020-09-01 (×12): 1300 mg via ORAL
  Filled 2020-08-24 (×12): qty 2

## 2020-08-24 MED ORDER — HYDRALAZINE HCL 20 MG/ML IJ SOLN: 10.0000 mg | Freq: Four times a day (QID) | INTRAMUSCULAR | Status: DC | PRN

## 2020-08-24 MED ORDER — CARVEDILOL 6.25 MG PO TABS: 6.2500 mg | ORAL_TABLET | Freq: Two times a day (BID) | ORAL | Status: DC

## 2020-08-24 MED ORDER — SODIUM BICARBONATE 8.4 % IV SOLN
150.0000 meq | Freq: Once | INTRAVENOUS | Status: AC
  Administered 2020-08-24: 150 meq via INTRAVENOUS
  Filled 2020-08-24: qty 150

## 2020-08-24 MED ORDER — BUPROPION HCL ER (XL) 150 MG PO TB24
150.0000 mg | ORAL_TABLET | Freq: Every day | ORAL | Status: DC
  Administered 2020-08-28 – 2020-09-06 (×10): 150 mg via ORAL
  Filled 2020-08-24 (×13): qty 1

## 2020-08-24 MED ORDER — PANTOPRAZOLE SODIUM 40 MG IV SOLR
40.0000 mg | INTRAVENOUS | Status: DC
  Administered 2020-08-24 – 2020-08-27 (×4): 40 mg via INTRAVENOUS
  Filled 2020-08-24 (×3): qty 40

## 2020-08-24 MED ORDER — SODIUM BICARBONATE 650 MG PO TABS: 1300.0000 mg | ORAL_TABLET | Freq: Two times a day (BID) | ORAL | Status: DC

## 2020-08-24 MED ORDER — SODIUM BICARBONATE 8.4 % IV SOLN
100.0000 meq | Freq: Four times a day (QID) | INTRAVENOUS | Status: DC
Start: 2020-08-24 — End: 2020-08-24
  Administered 2020-08-24: 100 meq via INTRAVENOUS
  Filled 2020-08-24 (×2): qty 50

## 2020-08-24 MED ORDER — ASPIRIN EC 81 MG PO TBEC: 81.0000 mg | DELAYED_RELEASE_TABLET | Freq: Every day | ORAL | Status: DC

## 2020-08-24 MED ORDER — PIPERACILLIN-TAZOBACTAM 3.375 G IVPB
3.3750 g | Freq: Three times a day (TID) | INTRAVENOUS | Status: DC
  Administered 2020-08-24: 3.375 g via INTRAVENOUS
  Filled 2020-08-24 (×2): qty 50

## 2020-08-24 MED ORDER — SODIUM BICARBONATE 8.4 % IV SOLN
INTRAVENOUS | Status: DC
  Filled 2020-08-24 (×2): qty 850

## 2020-08-24 MED ORDER — PIPERACILLIN-TAZOBACTAM IN DEX 2-0.25 GM/50ML IV SOLN
2.2500 g | Freq: Four times a day (QID) | INTRAVENOUS | Status: DC
  Administered 2020-08-24 – 2020-08-25 (×4): 2.25 g via INTRAVENOUS
  Filled 2020-08-24 (×5): qty 50

## 2020-08-24 NOTE — Progress Notes (Signed)
NAME:  [name], MRN:  [id number], DOB:  [birth year], LOS: 2 ADMISSION DATE:  08/22/2020, CONSULTATION DATE:  08/23/2020 REFERRING MD:  Dr. Doristine Bosworth , CHIEF COMPLAINT: Worsening AMS  Brief History:  75 year old male presented with acute renal failure.  PCCM consulted 1/9 for worsening altered mental status and concern for further decompensation.  History of Present Illness:  [name] is a 75 year old male with extensive past medical history including chronic systolic congestive heart failure, type 2 diabetes, neuropathy, PAD ASCVD diffuse, and recent osteoarthritis.  Of note patient was recently hospitalized at this facility 06/2420 -08/01/2020 for septic arthritis involving both shoulders and osteomyelitis of right humerus with GBS and Pseudomonas bacteremia resulting in severe septic shock.  A PICC line was placed and patient was prescribed 8 weeks of prolonged IV antibiotics with cefepime 2 mg 3 times daily.  Initially planned to be discontinued end of January 28th.  Patient presented to outpatient ID follow-up appointment 08/21/2020 with Dr. [name] at which time patient was seen with acute renal failure.  Patient was encouraged to present to the emergency department for further evaluation.  Patient was admitted by hospitalist services with nephrology, orthopedic and infectious disease consultations.  He continued to receive IV cefepime per infectious disease consultation.  Per chart review it appears patient became progressively lethargic evening of 1/8 with concerns of worsening sepsis.  Spines evaluated.  By morning of 11/9 patient is seen with progressively worsening altered mental status with concern for worsening decompensation.  Patient was transferred to progressive care.  After transfer to progressive care patient was seen with brief transition to atrial fibrillation with rapid ventricular response that was self-limiting.  Head CT currently pending therefore, PCCM was consulted for  further systems and management.  Past Medical History:  Chronic systolic congestive heart failure Type 2 diabetes Neuropathy PAD Osteoarthritis ASCVD diffuse  Significant Hospital Events:  Admitted 1/7  Consults:  Nephrology Infectious disease Orthopedic surgery  Procedures:    Significant Diagnostic Tests:  Renal ultrasound 1/8 > Enlarged echogenic kidneys consistent with acute nephritis. No Hydronephrosis.  Head CT 1/9 >   Micro Data:  COVID 1/7 > Negative  Urine culture 1/8 > MRSA PCR 1/8 > negative  Antimicrobials:  Cefepime 1/8 > 1/9 Zosyn 1/9 >  Interim History / Subjective:  Seen lying in bed after returning from CT scan. Acutely encephalopathic, will respond to verbal stimuli  Objective   Blood pressure 139/64, pulse (!) 114, temperature 98.1 F (36.7 C), temperature source Axillary, resp. rate 18, height 6' (1.829 m), weight 89.3 kg, SpO2 96 %.        Intake/Output Summary (Last 24 hours) at 08/24/2020 1055 Last data filed at 08/24/2020 0933 Gross per 24 hour  Intake 940.67 ml  Output 1475 ml  Net -534.33 ml   Filed Weights   08/22/20 2059  Weight: 89.3 kg    Examination: General: Chronically ill appearing elderly lying in bed acutely encephalopathic. HEENT: [place]/AT, MM pink/moist, PERRL,  Neuro: Will open eyes to verbal stimuli but will not follow any commands, currently protecting airway CV: s1s2 regular rate and rhythm, no murmur, rubs, or gallops,  PULM: Clear to auscultation bilaterally, no increased work of breathing, attempted to assess gag reflex but patient turned head after which he spontaneously coughed but did not produce any sputum GI: soft, bowel sounds active in all 4 quadrants, non-tender, non-distended Extremities: warm/dry, no edema  Skin: no rashes or lesions  Resolved Hospital Problem list     Assessment &  Plan:  Septic arthritis History of group B strep bacteremia and left shoulder septic arthritis Status post  arthroscopy with washout 07/10/2020. History of Pseudomonas right shoulder septic arthritis, osteomyelitis of the humerus/AC, and rotator cuff myositis Status post arthroscopic lavage and extensive debridement 07/17/2020. P: MRI of shoulders currently pending, unable to be performed given encephalopathy Orthopedic surgery consulted, currently no surgical interventions available Can consider placement of drains per IR if MRI shows fluid collection Infectious disease following, transition from IV cefepime to Zosyn today given encephalopathy Supportive care Follow cultures  Acute encephalopathy, multifactorial -Renal function continues to worsen rising concern for uremia.  Also concern for cefepime toxicity P: Head CT currently pending Remains obtunded but able to protect airway Hold any further benzodiazepines or gabapentin Cefepime stopped 1/9 If able patient will require MRI shoulder can consider MRI brain at that time if mentation will allow At risk for further decompensation close monitoring in progressive care Avoid sedating medications Delirium precautions Minimize sedation  Acute renal failure -Creatinine 1.10, GFR greater than 6012/17/21.  Patient presents with creatinine 4.13 GFR 14 08/22/2020 -Etiology unknown likely multifactorial given worsening sepsis and home use of NSAIDs and Entresto Metabolic acidosis P: Neurology following appreciate assistance Hold any further IV fluids Consider initiation of dialysis tomorrow if renal function continues to remain elevated Avoid nephrotoxins Strict intake and output Daily weight Ensure renal perfusion Trend to bmet Bicarb drip  History of of hypertension -Home medications include carvedilol, aspirin, Lasix, Imdur, Brilinta, Entresto History of chronic systolic congestive heart failure -Last echo 07/10/2020 with EF of 50 to 55% History of CAD status post CABG New onset paroxysmal atrial fibrillation -Patient seen with  development of atrial fibrillation with rapid ventricular response morning of 1/9, self-limiting P: Cardiology consulted Unable to utilize p.o. medications given altered mental status As needed IV hydralazine as needed for hypertension Close monitoring of hemodynamics in the progressive care setting May need to consider systemic anticoagulation Continue Brilinta and aspirin as able given restricted p.o. access Daily assessment of need of diuretics To intake and output Close monitoring of volume status  Chronic hyponatremia P: Trend to be met  Type 2 diabetes P: Continue SSI ABG every 4 hours  Generalized anxiety disorder -Patient has complex psychiatric history including anxiety, PTSD, paranoia, and depression P: Home medications on hold due to altered mental status, resume when able  Best practice (evaluated daily)  Diet: N.p.o. Pain/Anxiety/Delirium protocol (if indicated): As needed VAP protocol (if indicated): Not applicable DVT prophylaxis: Brilinta and aspirin GI prophylaxis: PPI Glucose control: SSI Mobility: Bedrest Disposition: Progressive care with low threshold to transfer to ICU  Goals of Care:  Last date of multidisciplinary goals of care discussion: Pending  Family and staff present: Pending Summary of discussion: Pending Follow up goals of care discussion due: Pending Code Status: ICU  Labs   CBC: Recent Labs  Lab 08/22/20 2201 08/24/20 0052  WBC 6.8 6.3  NEUTROABS 4.3 4.4  HGB 9.7* 10.3*  HCT 29.6* 32.5*  MCV 88.9 89.0  PLT 242 992    Basic Metabolic Panel: Recent Labs  Lab 08/22/20 2201 08/23/20 0323 08/24/20 0052  NA 129* 130* 133*  K 5.3* 5.0 4.6  CL 101 103 105  CO2 14* 15* 13*  GLUCOSE 87 104* 101*  BUN 86* 86* 89*  CREATININE 4.13* 4.15* 4.55*  CALCIUM 9.4 9.1 8.8*  MG  --   --  2.5*   GFR: Estimated Creatinine Clearance: 15.6 mL/min (A) (by C-G formula based on SCr  of 4.55 mg/dL (H)). Recent Labs  Lab 08/22/20 2201  08/23/20 2247 08/24/20 0052  WBC 6.8  --  6.3  LATICACIDVEN  --  0.7 0.7    Liver Function Tests: Recent Labs  Lab 08/22/20 2201 08/24/20 0052  AST 17 15  ALT 16 13  ALKPHOS 106 100  BILITOT 0.7 0.7  PROT 7.3 6.9  ALBUMIN 2.6* 2.3*   No results for input(s): LIPASE, AMYLASE in the last 168 hours. No results for input(s): AMMONIA in the last 168 hours.  ABG    Component Value Date/Time   PHART 7.284 (L) 08/23/2020 2339   PCO2ART 29.1 (L) 08/23/2020 2339   PO2ART 90.7 08/23/2020 2339   HCO3 13.4 (L) 08/23/2020 2339   TCO2 22 05/07/2020 1444   ACIDBASEDEF 12.0 (H) 08/23/2020 2339   O2SAT 96.5 08/23/2020 2339     Coagulation Profile: No results for input(s): INR, PROTIME in the last 168 hours.  Cardiac Enzymes: No results for input(s): CKTOTAL, CKMB, CKMBINDEX, TROPONINI in the last 168 hours.  HbA1C: Hemoglobin A1C  Date/Time Value Ref Range Status  12/01/2017 12:00 AM 6.6  Final  07/28/2017 12:00 AM 6.5  Final   Hgb A1c MFr Bld  Date/Time Value Ref Range Status  08/22/2020 10:01 PM 6.5 (H) 4.8 - 5.6 % Final    Comment:    (NOTE) Pre diabetes:          5.7%-6.4%  Diabetes:              >6.4%  Glycemic control for   <7.0% adults with diabetes   05/12/2020 06:22 PM 7.2 (H) 4.8 - 5.6 % Final    Comment:    (NOTE) Pre diabetes:          5.7%-6.4%  Diabetes:              >6.4%  Glycemic control for   <7.0% adults with diabetes     CBG: Recent Labs  Lab 08/23/20 1349 08/23/20 1748 08/23/20 2012 08/23/20 2201 08/24/20 0624  GLUCAP 103* 95 92 107* 106*    Review of Systems:   Unable to assess given altered mental status  Past Medical History:  He,  has a past medical history of Acute renal failure (ARF) ([place]) (08/21/2020), Anemia, Anxiety, Blindness of left eye, BPH (benign prostatic hypertrophy), Broken foot (Oct. 12, 2013), CAD (coronary artery disease), Carotid stenosis, left, Cellulitis and abscess of leg (03/2018), Chronic hyponatremia,  Chronic pain syndrome, COPD (chronic obstructive pulmonary disease) ([place]), Depression, Diabetes mellitus, Diabetic foot ulcer ([place]), DJD (degenerative joint disease), DM2 (diabetes mellitus, type 2) ([place]), GERD (gastroesophageal reflux disease), HLD (hyperlipidemia), HTN (hypertension), blood transfusion reaction, Hyponatremia, Lumbar disc disease, MI (mitral incompetence), Myocardial infarction ([place]), Osteoporosis, Peripheral neuropathy, Peripheral vascular disease ([place]), Restless leg syndrome, Spine fracture, Varicose veins, Visual impairment of left eye, and Vitamin D deficiency.   Surgical History:   Past Surgical History:  Procedure Laterality Date  . ANGIOPLASTY    . aorta bifemoral bypass grafting  09/2010  . CARDIAC CATHETERIZATION N/A 09/19/2015   Procedure: Left Heart Cath and Coronary Angiography;  Surgeon: Adrian Prows, MD;  Location: [facility];  Service: Cardiovascular;  Laterality: N/A;  . CARPAL TUNNEL RELEASE     right 2006/ left 2007  . CATARACT EXTRACTION     left 1996/ right 1997  . CORONARY ARTERY BYPASS GRAFT N/A 09/22/2015   Procedure: CORONARY ARTERY BYPASS GRAFTING (CABG) times four using the right greater saphenous vein harvested endoscopically and the  left internal mammary artery.  LIMA-LAD, SEQ SVG-DIAG & OM, SVG-PD.;  Surgeon: [name], MD;  Location: [facility];  Service: Open Heart Surgery;  Laterality: N/A;  . I & D EXTREMITY Right 04/07/2018   Procedure: IRRIGATION AND DEBRIDEMENT ABSCESS RIGHT LEG;  Surgeon: Newt Minion, MD;  Location: [facility];  Service: Orthopedics;  Laterality: Right;  . INCISION AND DRAINAGE OF WOUND Right 05/07/2020   Procedure: RIGHT LONG FINGER IRRIGATION AND DEBRIDEMENT AND AMPUTATION;  Surgeon: Iran Planas, MD;  Location: [facility];  Service: Orthopedics;  Laterality: Right;  with IV sedation  . INCISION AND DRAINAGE OF WOUND Right 05/12/2020   Procedure: IRRIGATION AND DEBRIDEMENT WOUND and possible revision amputation;  Surgeon:  Iran Planas, MD;  Location: [facility];  Service: Orthopedics;  Laterality: Right;  needs 60 minutes  . IR FLUORO GUIDE CV LINE RIGHT  07/18/2020  . IR US GUIDE VASC ACCESS RIGHT  07/18/2020  . L foot open repair jones fracture  2010    5th metetarsal   . left ankle ganglion cyst  1976  . left carotid endarterectomy  03/2011  . left cataract  1996   right - 1997  . left CTS  2006   R CTS - 2007  . left foot surgery  1998   R surgery/fracture - 1999  . left plantar ganglion cystectomy  1979  . left wrist/hand fusion  2008  . [place], 2004  . NASAL SINUS SURGERY     multiple- x8. last 1997 with obliteration  . REPAIR THORACIC AORTA  2017  . right hand fracture  1969  . ROTATOR CUFF REPAIR  2006   R, than repeat 2011, Dr Theda Sers  . SHOULDER ARTHROSCOPY Bilateral 07/17/2020   Procedure: Right shoulder arthroscopic lavage; Left shoulder aspiration.;  Surgeon: Justice Britain, MD;  Location: [facility];  Service: Orthopedics;  Laterality: Bilateral;  172mn  . SHOULDER ARTHROSCOPY WITH LABRAL REPAIR Left 07/10/2020   Procedure: SHOULDER ARTHROSCOPY WITH WASHOUT;  Surgeon: SJustice Britain MD;  Location: [facility];  Service: Orthopedics;  Laterality: Left;  . TRIGGER FINGER RELEASE Left 11/11/2014   Procedure: LEFT LONG FINGER RELEASE TRIGGER FINGER/A-1 PULLEY;  Surgeon: DMilly Jakob MD;  Location: [facility]  Service: Orthopedics;  Laterality: Left;  . vocal surgery  1996     Social History:   reports that he quit smoking about 31 years ago. His smoking use included cigarettes. He has a 45.00 pack-year smoking history. He has never used smokeless tobacco. He reports previous alcohol use. He reports that he does not use drugs.   Family History:  His family history includes Bleeding Disorder in his mother; Brain cancer (age of onset: 717 in his paternal grandfather; Depression in his mother; Diabetes in his sister; Diabetes (age of onset: 649 in his maternal grandfather;  Diabetes (age of onset: 718 in his mother; Heart attack in his paternal grandmother; Heart disease in his maternal grandfather; Lung cancer (age of onset: 583 in his maternal grandmother; Lung cancer (age of onset: 530 in his father; Osteoporosis in his mother; Suicidality (age of onset: 417 in his son; Throat cancer in an other family member.   Allergies Allergies  Allergen Reactions  . Ivp Dye [Iodinated Diagnostic Agents]     Blood Pressure dropped----pt was pre-medicated with 13 hour prep and did fine with pre-meds--amy 03/09/11   . Sulfa Antibiotics Hives  . Sulfonamide Derivatives Hives and Itching  . Lisinopril Cough  . Metoprolol  Nausea And Vomiting  . Statins Other (See Comments)    myalgias  . Propoxyphene N-Acetaminophen Other (See Comments)    Sharp pains- headache     Home Medications  Prior to Admission medications   Medication Sig Start Date End Date Taking? Authorizing Provider  acetaminophen (TYLENOL) 500 MG tablet Take 1,000 mg by mouth every 6 (six) hours as needed for mild pain, fever or headache.   Yes [provider]  albuterol (VENTOLIN HFA) 108 (90 Base) MCG/ACT inhaler Inhale 2 puffs into the lungs every 6 (six) hours as needed for wheezing or shortness of breath.   Yes [provider]  aspirin EC 81 MG tablet Take 1 tablet (81 mg total) by mouth daily. 07/15/20  Yes Spero Geralds, MD  BRILINTA 90 MG TABS tablet Take 90 mg by mouth 2 (two) times daily. 11/21/19  Yes [provider]  buPROPion (WELLBUTRIN XL) 300 MG 24 hr tablet Take 1 tablet (300 mg total) by mouth daily. 07/24/20  Yes Donnal Moat T, PA-C  carvedilol (COREG) 3.125 MG tablet Take 1 tablet (3.125 mg total) by mouth 2 (two) times daily with a meal. 07/22/20 09/20/20 Yes Kc, Maren Beach, MD  celecoxib (CELEBREX) 200 MG capsule TAKE 1 CAPSULE(200 MG) BY MOUTH DAILY Patient taking differently: Take 200 mg by mouth 2 (two) times daily. 10/02/18  Yes [name], NP   Cholecalciferol (VITAMIN D3) 5000 units TABS Take 5,000 Units by mouth daily.   Yes [provider]  clonazePAM (KLONOPIN) 0.5 MG tablet Take 1 tablet (0.5 mg total) by mouth 2 (two) times daily as needed for anxiety. 07/25/20  Yes Hurst, Teresa T, PA-C  ENTRESTO 49-51 MG Take 1 tablet by mouth 2 (two) times daily. 12/11/19  Yes [provider]  furosemide (LASIX) 20 MG tablet TAKE 1 TABLET BY MOUTH TWICE DAILY Patient taking differently: Take 20 mg by mouth 2 (two) times daily. 07/17/19  Yes Adrian Prows, MD  gabapentin (NEURONTIN) 400 MG capsule 1 po q am, 1 po at lunch, 2 po qhs. 07/28/20  Yes Hurst, Teresa T, PA-C  IRON PO Take 1 capsule by mouth daily.   Yes [provider]  isosorbide mononitrate (IMDUR) 60 MG 24 hr tablet Take 60 mg by mouth daily. 12/11/19  Yes [provider]  JANUVIA 100 MG tablet TAKE 1 TABLET BY MOUTH EVERY DAY Patient taking differently: Take 100 mg by mouth daily. 05/19/18  Yes [name], DO  Multiple Vitamin (MULTIVITAMIN WITH MINERALS) TABS tablet Take 1 tablet by mouth daily.   Yes [provider]  NARCAN 4 MG/0.1ML LIQD nasal spray kit Place 4 mg into the nose as needed (opioid reversal). 06/18/19  Yes [provider]  nitroGLYCERIN (NITROSTAT) 0.4 MG SL tablet PLACE 1 TABLET UNDER THE TONGUE EVERY 5 MINUTES AS NEEDED FOR CHEST PAIN Patient taking differently: Place 0.4 mg under the tongue every 5 (five) minutes as needed for chest pain. 07/17/19  Yes Adrian Prows, MD  oxyCODONE-acetaminophen (PERCOCET) 10-325 MG tablet Take 1 tablet by mouth in the morning and at bedtime. 12/24/19  Yes [provider]  rOPINIRole (REQUIP) 1 MG tablet Take 1 tablet (1 mg total) by mouth in the morning and at bedtime. 07/24/20  Yes Donnal Moat T, PA-C  tamsulosin (FLOMAX) 0.4 MG CAPS capsule Take 1 capsule (0.4 mg total) by mouth 2 (two) times daily. 11/16/18  Yes [name], NP  ACCU-CHEK FASTCLIX LANCETS MISC Use as  directed to check blood  glucose. Dx: E11.51 01/17/18   [name], NP  ARIPiprazole (ABILIFY) 2 MG tablet 1 p.o. every morning for 1 week and then increase to 2 p.o. every morning. 08/06/20   Donnal Moat T, PA-C  Blood Glucose Monitoring Suppl (ACCU-CHEK AVIVA PLUS) w/Device KIT Use as directed to check blood glucose daily. Dx: E11.51 01/17/18   [name], NP  ceFEPime (MAXIPIME) IVPB Inject 2 g into the vein every 8 (eight) hours. Indication:  Septic arthritis/osteomyelitis  First Dose: No Last Day of Therapy:  09/12/2020 Labs - Once weekly:  CBC/D and BMP, Labs - Every other week:  ESR and CRP Method of administration: IV Push Method of administration may be changed at the discretion of home infusion pharmacist based upon assessment of the patient and/or caregiver's ability to self-administer the medication ordered. 07/22/20 08/28/20  Antonieta Pert, MD  glucose blood (ACCU-CHEK AVIVA PLUS) test strip 1 each by Other route as needed for other. Use as instructed. Dx: E11.51 01/17/18   [name], NP  omeprazole (PRILOSEC) 40 MG capsule Take 40 mg by mouth 2 (two) times daily. 11/26/19   [provider]     Signature  Johnsie Cancel, NP-C [facility] / Pager information can be found on Amion  08/24/2020, 11:25 AM

## 2020-08-24 NOTE — Progress Notes (Signed)
[facility]  Date of Admission:  08/22/2020           Reason for visit: Follow up on bilateral shoulder septic arthritis  Current antibiotics: Cefepime  ASSESSMENT:    75 year old man with past medical history significant for chronic systolic heart failure, diabetes, PAD, bilateral septic shoulder arthritis, and currently on outpatient IV antibiotics who was admitted for worsening renal failure and encephalopathy.  History of group B strep bacteremia and left shoulder septic arthritis Status post arthroscopy with washout 07/10/2020.  History of Pseudomonas right shoulder septic arthritis, osteomyelitis of the humerus/AC, and rotator cuff myositis Status post arthroscopic lavage and extensive debridement 07/17/2020.  Has been on cefepime for many weeks to treat both of these above infections with planned 8-week course of therapy that was scheduled to end later this month.  He is currently admitted with concern for ongoing infection based on elevated synovial fluid cell counts.  Also, more urgently admitted for his worsening renal function and now encephalopathy.  Awaiting orthopedic surgery evaluation and possible intervention.  CRP and ESR this admission overall improved.  He does not otherwise exhibit other signs/symptoms of infection and is afebrile without leukocytosis.   Acute kidney injury Unclear etiology at this time.  Nephrology following.  Creatinine worsening today up to 4.5 with worsening acidosis.  Renal ultrasound shows no hydronephrosis but kidneys enlarged concerning for nephritis.  Urinalysis without WBCs or RBCs, but proteinuria noted.  AIN from cefepime would be rare.  Potential other etiologies include NSAIDs, Lasix, or Entresto  Encephalopathy Likely multifactorial process with several possible contributing medications including gabapentin and cefepime.  Cefepime neurotoxicity is a possibility given he was on a higher dose as an outpatient  with worsening GFR.  Uremia also potentially contributing as well.  Currently appears to be protecting his airway, however, certainly at risk for further decompensation and higher level of care  Metabolic acidosis Likely associated with acute kidney injury   PLAN:   -- Will discontinue cefepime to remove as a potential etiology for his encephalopathy -- We will start renally dosed piperacillin tazobactam -- Orthopedic surgery evaluation pending -- Monitor renal function and mental status -- We will follow  SUBJECTIVE:   Transferred to the stepdown unit overnight due to progressive lethargy Developed A. fib earlier this morning Labs reveal stable WBC, worsening renal failure and acidosis Lactic acid normal urinalysis with proteinuria and 0 RBC Chest x-ray shows vascular congestion Renal ultrasound shows enlarged echogenic kidneys consistent with acute nephritis Afebrile, WBC normal.   Patient is encephalopathic and unable to provide further subjective history.   OBJECTIVE:   Blood pressure 139/64, pulse (!) 114, temperature 98.1 F (36.7 C), temperature source Axillary, resp. rate 18, height 6' (1.829 m), weight 89.3 kg, SpO2 96 %. Body mass index is 26.7 kg/m.  Physical Exam Constitutional:      Comments: Lying in bed on his right side, altered, not following commands.  HENT:     Head: Normocephalic and atraumatic.  Eyes:     Extraocular Movements: Extraocular movements intact.     Conjunctiva/sclera: Conjunctivae normal.  Cardiovascular:     Rate and Rhythm: Tachycardia present. Rhythm irregular.     Comments: Tunneled right IJ CVC Pulmonary:     Effort: Pulmonary effort is normal. No respiratory distress.     Comments: Breath sounds are diminished at the bases. Skin:    General: Skin is warm and dry.     Findings: No rash.  Neurological:     Comments: He is moving all 4 extremities but is disoriented and encephalopathic     Lab Results: Lab Results   Component Value Date   WBC 6.3 08/24/2020   HGB 10.3 (L) 08/24/2020   HCT 32.5 (L) 08/24/2020   MCV 89.0 08/24/2020   PLT 255 08/24/2020    Lab Results  Component Value Date   NA 133 (L) 08/24/2020   K 4.6 08/24/2020   CO2 13 (L) 08/24/2020   GLUCOSE 101 (H) 08/24/2020   BUN 89 (H) 08/24/2020   CREATININE 4.55 (H) 08/24/2020   CALCIUM 8.8 (L) 08/24/2020   GFRNONAA 13 (L) 08/24/2020   GFRAA 45 (L) 05/04/2020    Lab Results  Component Value Date   ALT 13 08/24/2020   AST 15 08/24/2020   ALKPHOS 100 08/24/2020   BILITOT 0.7 08/24/2020       Component Value Date/Time   CRP 1.2 (H) 08/22/2020 2201       Component Value Date/Time   ESRSEDRATE 60 (H) 08/23/2020 0323     I have reviewed the micro and lab results in Epic.  Imaging: US RENAL  Result Date: 08/23/2020 CLINICAL DATA:  Acute renal failure EXAM: RENAL / URINARY TRACT ULTRASOUND COMPLETE COMPARISON:  CT 09/21/2010 FINDINGS: Right Kidney: Renal measurements: 13.3 x 6.2 x 5.3 cm = volume: 223 mL. Increased echogenicity. No mass or hydronephrosis visualized. Septated cyst measuring 3 cm in size. Left Kidney: Renal measurements: 14.0 x 8.6 x 7.2 cm = volume: 454 mL. Increased echogenicity. No mass or hydronephrosis visualized. Nonobstructing 4 mm stone in the midportion. Bladder: Appears normal for degree of bladder distention. Other: None. IMPRESSION: Enlarged echogenic kidneys consistent with acute nephritis. No hydronephrosis. Electronically Signed   By: Nelson Chimes M.D.   On: 08/23/2020 12:19   DG CHEST PORT 1 VIEW  Result Date: 08/23/2020 CLINICAL DATA:  75 year old male with shortness of breath EXAM: PORTABLE CHEST 1 VIEW COMPARISON:  Chest radiograph dated 07/13/2020. FINDINGS: Central venous line with tip at the cavoatrial junction. Cardiomegaly with mild vascular congestion. No focal consolidation, pleural effusion or pneumothorax. Median sternotomy wires. No acute osseous pathology. IMPRESSION: Cardiomegaly with  mild vascular congestion. No focal consolidation. Electronically Signed   By: Anner Crete M.D.   On: 08/23/2020 21:57   DG Abd Portable 1V  Result Date: 08/23/2020 CLINICAL DATA:  MRI clearance EXAM: PORTABLE ABDOMEN - 1 VIEW COMPARISON:  None. FINDINGS: No metallic foreign body was identified in the abdomen or pelvis. Advanced vascular calcifications are noted. The bowel gas pattern is nonobstructive. There is a moderate amount of stool in the colon. Degenerative changes are noted throughout the lumbar spine and bilateral hips. IMPRESSION: 1. No metallic foreign body identified in the abdomen or pelvis. Advanced vascular calcifications are noted. 2. Nonobstructive bowel gas pattern. Moderate stool burden. Electronically Signed   By: Constance Holster M.D.   On: 08/23/2020 22:04      [facility] [phone] pager 08/24/2020, 8:20 AM

## 2020-08-24 NOTE — Progress Notes (Signed)
Spoke to RN to check status of this patient for MRI. The RN stated at this time patient is unable to follow commands and will not lay still for MRI. I let the RN know we will check on the pt later and if something changes to please give [place] a call.

## 2020-08-24 NOTE — Consult Note (Cosign Needed)
ORTHOPAEDIC CONSULTATION  REQUESTING PHYSICIAN: Darliss Cheney, MD  PCP:  Patrecia Pour, Christean Grief, MD  Chief Complaint: Renal failure, FTT, septic arthritis  HPI:  [name] is a 75 y.o. male with medical history significant of multiple medical problems including chronic systolic heart failure, DM with completication of neuropathy, PAD, ASVD diffuse. He has a h/o chronic shoulder problems. He was admitted 07/09/20-08/01/20 for septic arthritis both shoulders with GBS and Pseudomonas with septic shock. He was also found to have osteomyelitis right humerus. A PICC line was placed and the patient was discharged on 8 weeks of cefipeme 2g TID to end January 28th.  He was seen by Dr. [name] for ID follow-up 08/21/20. At that encounter Dr. [name] recommended the patient be admitted for continue care, management of renal failure and continued orthopedic consultation. It took until late on the day of admission, 08/22/20, for a bed to be available. Patient became obtunded after admission and was moved from orthopedic floor to progressive care.   Past Medical History:  Diagnosis Date  . Acute renal failure (ARF) ([place]) 08/21/2020  . Anemia    NOS  . Anxiety   . 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 BPH (benign prostatic hypertrophy)   . Broken foot Oct. 12, 2013   Right foot Fx  . CAD (coronary artery disease)   . Carotid stenosis, left   . Cellulitis and abscess of leg 03/2018   right leg  . Chronic hyponatremia   . Chronic pain syndrome   . COPD (chronic obstructive pulmonary disease) ([place])   . Depression   . Diabetes mellitus   . Diabetic foot ulcer ([place])   . DJD (degenerative joint disease)    L wrist  . DM2 (diabetes mellitus, type 2) ([place])   . GERD (gastroesophageal reflux disease)   . HLD (hyperlipidemia)   . HTN (hypertension)   . Hx of blood transfusion reaction   . Hyponatremia   . Lumbar disc disease   . MI (mitral incompetence)   . Myocardial  infarction ([place])    x 2  . Osteoporosis    tx per [facility], Dr Prudencio Burly, thought due to heavy steriod use after 1978  . Peripheral neuropathy   . Peripheral vascular disease ([place])   . Restless leg syndrome   . Spine fracture    hx, multiple  . Varicose veins   . 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 Vitamin D deficiency    Past Surgical History:  Procedure Laterality Date  . ANGIOPLASTY    . aorta bifemoral bypass grafting  09/2010  . CARDIAC CATHETERIZATION N/A 09/19/2015   Procedure: Left Heart Cath and Coronary Angiography;  Surgeon: Adrian Prows, MD;  Location: [facility];  Service: Cardiovascular;  Laterality: N/A;  . CARPAL TUNNEL RELEASE     right 2006/ left 2007  . CATARACT EXTRACTION     left 1996/ right 1997  . CORONARY ARTERY BYPASS GRAFT N/A 09/22/2015   Procedure: CORONARY ARTERY BYPASS GRAFTING (CABG) times four using the right greater saphenous vein harvested endoscopically and the left internal mammary artery.  LIMA-LAD, SEQ SVG-DIAG & OM, SVG-PD.;  Surgeon: [name], MD;  Location: [facility];  Service: Open Heart Surgery;  Laterality: N/A;  . I & D EXTREMITY Right 04/07/2018   Procedure: IRRIGATION AND DEBRIDEMENT ABSCESS RIGHT LEG;  Surgeon: Newt Minion, MD;  Location: [facility];  Service:  Orthopedics;  Laterality: Right;  . INCISION AND DRAINAGE OF WOUND Right 05/07/2020   Procedure: RIGHT LONG FINGER IRRIGATION AND DEBRIDEMENT AND AMPUTATION;  Surgeon: Iran Planas, MD;  Location: [facility];  Service: Orthopedics;  Laterality: Right;  with IV sedation  . INCISION AND DRAINAGE OF WOUND Right 05/12/2020   Procedure: IRRIGATION AND DEBRIDEMENT WOUND and possible revision amputation;  Surgeon: Iran Planas, MD;  Location: [facility];  Service: Orthopedics;  Laterality: Right;  needs 60 minutes  . IR FLUORO GUIDE CV LINE RIGHT  07/18/2020  . IR US GUIDE VASC ACCESS RIGHT  07/18/2020  . L foot open repair jones fracture  2010    5th metetarsal   . left ankle  ganglion cyst  1976  . left carotid endarterectomy  03/2011  . left cataract  1996   right - 1997  . left CTS  2006   R CTS - 2007  . left foot surgery  1998   R surgery/fracture - 1999  . left plantar ganglion cystectomy  1979  . left wrist/hand fusion  2008  . [place], 2004  . NASAL SINUS SURGERY     multiple- x8. last 1997 with obliteration  . REPAIR THORACIC AORTA  2017  . right hand fracture  1969  . ROTATOR CUFF REPAIR  2006   R, than repeat 2011, Dr Theda Sers  . SHOULDER ARTHROSCOPY Bilateral 07/17/2020   Procedure: Right shoulder arthroscopic lavage; Left shoulder aspiration.;  Surgeon: Justice Britain, MD;  Location: [facility];  Service: Orthopedics;  Laterality: Bilateral;  187mn  . SHOULDER ARTHROSCOPY WITH LABRAL REPAIR Left 07/10/2020   Procedure: SHOULDER ARTHROSCOPY WITH WASHOUT;  Surgeon: SJustice Britain MD;  Location: [facility];  Service: Orthopedics;  Laterality: Left;  . TRIGGER FINGER RELEASE Left 11/11/2014   Procedure: LEFT LONG FINGER RELEASE TRIGGER FINGER/A-1 PULLEY;  Surgeon: DMilly Jakob MD;  [facility]  Service: Orthopedics;  Laterality: Left;  . vocal surgery  1996   Social History   Socioeconomic History  . Marital status: Divorced    Spouse name: Not on file  . Number of children: Not on file  . Years of education: Not on file  . Highest education level: Not on file  Occupational History  . Not on file  Tobacco Use  . Smoking status: Former Smoker    Packs/day: 1.50    Years: 30.00    Pack years: 45.00    Types: Cigarettes    Quit date: 11/06/1988    Years since quitting: 31.8  . Smokeless tobacco: Never Used  Vaping Use  . Vaping Use: Never used  Substance and Sexual Activity  . Alcohol use: Not Currently    Comment: None in 30 years but drank beer before- 05/09/20  . Drug use: No  . Sexual activity: Not Currently  Other Topics Concern  . Not on file  Social History Narrative   Social History       Diet? I eat about what I want.       Do you drink/eat things with caffeine? yes      Marital status?                  divorced                  What year were you married? 1966 to 2007      Do you live in a house, apartment, assisted living, condo, trailer, etc.? [employer]  Is it one or more stories? one      How many persons live in your home? Me only      Do you have any pets in your home? (please list) no      Highest level of education completed? Finished high school      Current or past profession: Clinical biochemist in [place] you exercise?               no                       Type & how often?      Advanced Directives      Do you have a living will? no      Do you have a DNR form?     no                             If not, do you want to discuss one?      Do you have signed POA/HPOA for forms? No      Functional Status      Do you have difficulty bathing or dressing yourself? No- shower      Do you have difficulty preparing food or eating? No- Don't want to      Do you have difficulty managing your medications?      Do you have difficulty managing your finances?      Do you have difficulty affording your medications? No- paid for by [employer] Strain: Not on [employer] Insecurity: Not on file  Transportation Needs: Not on file  Physical Activity: Not on file  Stress: Not on file  Social Connections: Not on file   Family History  Problem Relation Age of Onset  . Lung cancer Father 28  . Diabetes Mother 25  . Osteoporosis Mother   . Depression Mother   . Bleeding Disorder Mother   . Throat cancer Other        family hx - "bad living" - also lung CA, heart disease and renal failure    . Diabetes Sister   . Suicidality Son 12  . Lung cancer Maternal Grandmother 59  . Diabetes Maternal Grandfather 4  . Heart disease Maternal Grandfather   . Heart attack Paternal Grandmother   .  Brain cancer Paternal Grandfather 65   Allergies  Allergen Reactions  . Ivp Dye [Iodinated Diagnostic Agents]     Blood Pressure dropped----pt was pre-medicated with 13 hour prep and did fine with pre-meds--amy 03/09/11   . Sulfa Antibiotics Hives  . Sulfonamide Derivatives Hives and Itching  . Lisinopril Cough  . Metoprolol Nausea And Vomiting  . Statins Other (See Comments)    myalgias  . Propoxyphene N-Acetaminophen Other (See Comments)    Sharp pains- headache   Prior to Admission medications   Medication Sig Start Date End Date Taking? Authorizing Provider  acetaminophen (TYLENOL) 500 MG tablet Take 1,000 mg by mouth every 6 (six) hours as needed for mild pain, fever or headache.   Yes [provider]  albuterol (VENTOLIN HFA) 108 (90 Base) MCG/ACT inhaler Inhale 2 puffs into the lungs every 6 (six) hours as needed for wheezing or shortness of breath.   Yes [provider]  aspirin EC 81 MG tablet Take 1 tablet (81 mg total)  by mouth daily. 07/15/20  Yes Spero Geralds, MD  BRILINTA 90 MG TABS tablet Take 90 mg by mouth 2 (two) times daily. 11/21/19  Yes [provider]  buPROPion (WELLBUTRIN XL) 300 MG 24 hr tablet Take 1 tablet (300 mg total) by mouth daily. 07/24/20  Yes Donnal Moat T, PA-C  carvedilol (COREG) 3.125 MG tablet Take 1 tablet (3.125 mg total) by mouth 2 (two) times daily with a meal. 07/22/20 09/20/20 Yes Kc, Maren Beach, MD  celecoxib (CELEBREX) 200 MG capsule TAKE 1 CAPSULE(200 MG) BY MOUTH DAILY Patient taking differently: Take 200 mg by mouth 2 (two) times daily. 10/02/18  Yes [name], NP  Cholecalciferol (VITAMIN D3) 5000 units TABS Take 5,000 Units by mouth daily.   Yes [provider]  clonazePAM (KLONOPIN) 0.5 MG tablet Take 1 tablet (0.5 mg total) by mouth 2 (two) times daily as needed for anxiety. 07/25/20  Yes Hurst, Teresa T, PA-C  ENTRESTO 49-51 MG Take 1 tablet by mouth 2 (two) times daily. 12/11/19  Yes [provider]  furosemide (LASIX) 20 MG tablet TAKE 1 TABLET BY MOUTH TWICE DAILY Patient taking differently: Take 20 mg by mouth 2 (two) times daily. 07/17/19  Yes Adrian Prows, MD  gabapentin (NEURONTIN) 400 MG capsule 1 po q am, 1 po at lunch, 2 po qhs. 07/28/20  Yes Hurst, Teresa T, PA-C  IRON PO Take 1 capsule by mouth daily.   Yes [provider]  isosorbide mononitrate (IMDUR) 60 MG 24 hr tablet Take 60 mg by mouth daily. 12/11/19  Yes [provider]  JANUVIA 100 MG tablet TAKE 1 TABLET BY MOUTH EVERY DAY Patient taking differently: Take 100 mg by mouth daily. 05/19/18  Yes [name], DO  Multiple Vitamin (MULTIVITAMIN WITH MINERALS) TABS tablet Take 1 tablet by mouth daily.   Yes [provider]  NARCAN 4 MG/0.1ML LIQD nasal spray kit Place 4 mg into the nose as needed (opioid reversal). 06/18/19  Yes [provider]  nitroGLYCERIN (NITROSTAT) 0.4 MG SL tablet PLACE 1 TABLET UNDER THE TONGUE EVERY 5 MINUTES AS NEEDED FOR CHEST PAIN Patient taking differently: Place 0.4 mg under the tongue every 5 (five) minutes as needed for chest pain. 07/17/19  Yes Adrian Prows, MD  oxyCODONE-acetaminophen (PERCOCET) 10-325 MG tablet Take 1 tablet by mouth in the morning and at bedtime. 12/24/19  Yes [provider]  rOPINIRole (REQUIP) 1 MG tablet Take 1 tablet (1 mg total) by mouth in the morning and at bedtime. 07/24/20  Yes Donnal Moat T, PA-C  tamsulosin (FLOMAX) 0.4 MG CAPS capsule Take 1 capsule (0.4 mg total) by mouth 2 (two) times daily. 11/16/18  Yes [name], NP  ACCU-CHEK FASTCLIX LANCETS MISC Use as directed to check blood glucose. Dx: E11.51 01/17/18   [name], NP  ARIPiprazole (ABILIFY) 2 MG tablet 1 p.o. every morning for 1 week and then increase to 2 p.o. every morning. 08/06/20   Donnal Moat T, PA-C  Blood Glucose Monitoring Suppl (ACCU-CHEK AVIVA PLUS) w/Device KIT Use as directed to check blood glucose daily. Dx: E11.51  01/17/18   [name], NP  ceFEPime (MAXIPIME) IVPB Inject 2 g into the vein every 8 (eight) hours. Indication:  Septic arthritis/osteomyelitis  First Dose: No Last Day of Therapy:  09/12/2020 Labs - Once weekly:  CBC/D and BMP, Labs - Every other week:  ESR and CRP Method of administration: IV Push Method of administration may be changed at  the discretion of home infusion pharmacist based upon assessment of the patient and/or caregiver's ability to self-administer the medication ordered. 07/22/20 08/28/20  Antonieta Pert, MD  glucose blood (ACCU-CHEK AVIVA PLUS) test strip 1 each by Other route as needed for other. Use as instructed. Dx: E11.51 01/17/18   [name], NP  omeprazole (PRILOSEC) 40 MG capsule Take 40 mg by mouth 2 (two) times daily. 11/26/19   [provider]   US RENAL  Result Date: 08/23/2020 CLINICAL DATA:  Acute renal failure EXAM: RENAL / URINARY TRACT ULTRASOUND COMPLETE COMPARISON:  CT 09/21/2010 FINDINGS: Right Kidney: Renal measurements: 13.3 x 6.2 x 5.3 cm = volume: 223 mL. Increased echogenicity. No mass or hydronephrosis visualized. Septated cyst measuring 3 cm in size. Left Kidney: Renal measurements: 14.0 x 8.6 x 7.2 cm = volume: 454 mL. Increased echogenicity. No mass or hydronephrosis visualized. Nonobstructing 4 mm stone in the midportion. Bladder: Appears normal for degree of bladder distention. Other: None. IMPRESSION: Enlarged echogenic kidneys consistent with acute nephritis. No hydronephrosis. Electronically Signed   By: Nelson Chimes M.D.   On: 08/23/2020 12:19   DG CHEST PORT 1 VIEW  Result Date: 08/23/2020 CLINICAL DATA:  75 year old male with shortness of breath EXAM: PORTABLE CHEST 1 VIEW COMPARISON:  Chest radiograph dated 07/13/2020. FINDINGS: Central venous line with tip at the cavoatrial junction. Cardiomegaly with mild vascular congestion. No focal consolidation, pleural effusion or pneumothorax. Median sternotomy wires. No acute osseous  pathology. IMPRESSION: Cardiomegaly with mild vascular congestion. No focal consolidation. Electronically Signed   By: Anner Crete M.D.   On: 08/23/2020 21:57   DG Abd Portable 1V  Result Date: 08/23/2020 CLINICAL DATA:  MRI clearance EXAM: PORTABLE ABDOMEN - 1 VIEW COMPARISON:  None. FINDINGS: No metallic foreign body was identified in the abdomen or pelvis. Advanced vascular calcifications are noted. The bowel gas pattern is nonobstructive. There is a moderate amount of stool in the colon. Degenerative changes are noted throughout the lumbar spine and bilateral hips. IMPRESSION: 1. No metallic foreign body identified in the abdomen or pelvis. Advanced vascular calcifications are noted. 2. Nonobstructive bowel gas pattern. Moderate stool burden. Electronically Signed   By: Constance Holster M.D.   On: 08/23/2020 22:04    Positive ROS: Patient unable to provide answers to questions  Physical Exam: General: Obtunded, no acute distress Cardiovascular: No pedal edema Respiratory: No cyanosis, no use of accessory musculature GI: No organomegaly, abdomen is soft and non-tender Skin: No lesions in the area of chief complaint Neurologic: Sensation intact distally Psychiatric: Patient is unable to answer questions Lymphatic: No axillary or cervical lymphadenopathy  MUSCULOSKELETAL: Bilateral shoulders show no excessive edema, warm to touch.   Assessment: Septic arthritis, bilateral shoulders  Plan: Discussed patient with Dr. Onnie Graham. No surgical intervention from orthopedics necessary at this time. If swelling increases in shoulders, we could consider interventional radiology for aspiration and possible placement of drains. Continue abx per ID recommendations.     Dorothyann Peng, PA [phone]    08/24/2020 9:20 AM

## 2020-08-24 NOTE — Progress Notes (Signed)
Nephrology Follow-Up Consult note   Assessment/Recommendations: [name] is a/an 75 y.o. male with a past medical history significant for HFrEF, DM 2, neuropathy, PAD, CAD, chronic osteomyelitis who presents with AKI and AMS  AKI: Unclear cause at this time.  Creatinine only slightly worse at 4.5 today.  Renal ultrasound without hydronephrosis but enlarged kidneys unclear cause.  Most likely NSAID use and Entresto contributed to AKI.  Possible intermittent hypotension. AIN in differential -F/u UA today -Stop fluids given crackles on exam today -Ams obviously uremia as below but consider dialysis tomorrow if he is not improved -Dose medications appropriately as below -Continue to monitor daily Cr, Dose meds for GFR -Monitor Daily I/Os, Daily weight  -Maintain MAP>65 for optimal renal perfusion.  -Avoid nephrotoxic medications including NSAIDs and Vanc/Zosyn combo  Altered mental status: Altered on my exam but protecting his airway.    Likely multifactorial with gabapentin contributing.  Possible cefepime neurotoxicity.  Uremia possible but feel this is less likely as his AMS was not present on admission per H and P and then developed but it is certainly within differential -Hold further gabapentin and benzodiazepine -ID assisting with switching cefepime to Zosyn -Could consider dialysis tomorrow if the patient does not improve  Hyponatremia: Sodium 133.  Likely related to AKI with free water retention.  Continue to monitor  Metabolic acidosis: Likely associated with AKI.    Bicarb 13 today.  2amps of bicarb every 6 hours x 2 and oral bicarbonate 1300 mg 3 times daily.  Continue to monitor  Osteomyelitis: of shoulders.    Switching to Zosyn today.  ID and Ortho following  CHF/HTN: Reduced ejection fraction.  Can continue Imdur and carvedilol.  Hold Entresto for now.    Stopping fluids today due to crackles on exam.  Room air   Recommendations conveyed to primary service.     [facility] 08/24/2020 8:43 AM  ___________________________________________________________  CC: AKI  Interval History/Subjective: Worsening AMS over the past 24 hours transferred to [facility] for further care.  Creatinine slightly higher at 4.6 today.  Urine output not well documented.  Bicarbonate remains low.   Medications:  Current Facility-Administered Medications  Medication Dose Route Frequency Provider Last Rate Last Admin  . acetaminophen (TYLENOL) tablet 1,000 mg  1,000 mg Oral Q6H PRN Neena Rhymes, MD   1,000 mg at 08/23/20 0931  . albuterol (VENTOLIN HFA) 108 (90 Base) MCG/ACT inhaler 2 puff  2 puff Inhalation Q6H PRN Norins, Heinz Knuckles, MD      . ARIPiprazole (ABILIFY) tablet 5 mg  5 mg Oral Daily Norins, Heinz Knuckles, MD      . buPROPion (WELLBUTRIN XL) 24 hr tablet 300 mg  300 mg Oral Daily Norins, Heinz Knuckles, MD      . carvedilol (COREG) tablet 3.125 mg  3.125 mg Oral BID WC Norins, Heinz Knuckles, MD      . ceFEPIme (MAXIPIME) 2 g in sodium chloride 0.9 % 100 mL IVPB  2 g Intravenous Q24H Franky Macho, RPH 200 mL/hr at 08/23/20 1133 2 g at 08/23/20 1133  . Chlorhexidine Gluconate Cloth 2 % PADS 6 each  6 each Topical Daily Norins, Heinz Knuckles, MD   6 each at 08/24/20 [phone]  . ferrous sulfate tablet 325 mg  325 mg Oral Daily Norins, Heinz Knuckles, MD      . insulin aspart (novoLOG) injection 0-9 Units  0-9 Units Subcutaneous TID WC Norins, Heinz Knuckles, MD      .  isosorbide mononitrate (IMDUR) 24 hr tablet 60 mg  60 mg Oral Daily Norins, Heinz Knuckles, MD      . nitroGLYCERIN (NITROSTAT) SL tablet 0.4 mg  0.4 mg Sublingual Q5 min PRN Norins, Heinz Knuckles, MD      . pantoprazole (PROTONIX) EC tablet 80 mg  80 mg Oral Daily Norins, Heinz Knuckles, MD      . rOPINIRole (REQUIP) tablet 1 mg  1 mg Oral BID Norins, Heinz Knuckles, MD   1 mg at 08/22/20 2314  . senna (SENOKOT) tablet 8.6 mg  1 tablet Oral BID Neena Rhymes, MD   8.6 mg at 08/22/20 2314  . sodium bicarbonate  150 mEq in dextrose 5 % 1,000 mL infusion   Intravenous Continuous Reesa Chew, MD      . sodium bicarbonate tablet 1,300 mg  1,300 mg Oral BID Reesa Chew, MD      . sodium chloride 0.9 % bolus 500 mL  500 mL Intravenous Once Darliss Cheney, MD      . sodium chloride flush (NS) 0.9 % injection 10-40 mL  10-40 mL Intracatheter PRN Norins, Heinz Knuckles, MD      . tamsulosin (FLOMAX) capsule 0.4 mg  0.4 mg Oral BID Neena Rhymes, MD   0.4 mg at 08/22/20 2314  . ticagrelor (BRILINTA) tablet 90 mg  90 mg Oral BID Neena Rhymes, MD   90 mg at 08/22/20 2315      Review of Systems: Unable to assess due to the patient's AMS  Physical Exam: Vitals:   08/24/20 0336 08/24/20 0727  BP: (!) 146/86 139/64  Pulse: (!) 111 (!) 114  Resp: 17 18  Temp: 98.4 F (36.9 C) 98.1 F (36.7 C)  SpO2: 98% 96%   Total I/O In: 0  Out: 100 [Urine:100]  Intake/Output Summary (Last 24 hours) at 08/24/2020 0843 Last data filed at 08/24/2020 0745 Gross per 24 hour  Intake 940.67 ml  Output 100 ml  Net 840.67 ml   Constitutional: Ill-appearing, lying in bed, not interactive ENMT: ears and nose without scars or lesions, MMM CV: Tachycardic, no edema bilateral lower extremities Respiratory: Crackles in the bilateral bases, normal work of breathing Gastrointestinal: soft, non-tender, no palpable masses or hernias Skin: no visible lesions or rashes Psych: Altered, unable to answer questions appropriately   Test Results I personally reviewed new and old clinical labs and radiology tests Lab Results  Component Value Date   NA 133 (L) 08/24/2020   K 4.6 08/24/2020   CL 105 08/24/2020   CO2 13 (L) 08/24/2020   BUN 89 (H) 08/24/2020   CREATININE 4.55 (H) 08/24/2020   GFR 118.86 03/19/2015   GLU 116 12/01/2017   CALCIUM 8.8 (L) 08/24/2020   ALBUMIN 2.3 (L) 08/24/2020   PHOS 3.2 07/18/2020

## 2020-08-24 NOTE — Progress Notes (Signed)
Patient went into afib rhythm at 0428. HR currently in the 100-110s. Will continue to monitor the patient.

## 2020-08-24 NOTE — Plan of Care (Signed)

## 2020-08-24 NOTE — Progress Notes (Signed)
Pt is alert to self, unable to follow commands. NSR on monitor, BP stable, Getting sodium bicarb @ 25cc/hr this time. In NPO, unable to swallow anything/risk of aspiration. Oral medicines on hold, MD aware. CC team is following. RR nurse Hildred Alamin made aware. Updates given to care taker via interpretor  Services at bedside.  Will continue to monitor Anthony Caster,RN

## 2020-08-24 NOTE — Progress Notes (Signed)
Lab notified of abg being sent down. RT will continue to monitor.

## 2020-08-24 NOTE — Progress Notes (Addendum)
Pharmacy Antibiotic Note  [name] is a 75 y.o. male admitted on 08/22/2020 with Bilateral septic arthritis with Group B streptococcus and Pseudomonas. Pt being followed by ID as outpt and on Cefepime which was continued inpatient (planned 8 wks course with stop date of 1/28). Pharmacy asked to switch to Zosyn. WBC remains wnl at 6.3, afebrile. In AKI with Scr 4.55 today (BL ~0.5). Renal team following, considering HD tomorrow if no improvement.  Plan: Stop Cefepime Start Zosyn 2.25 gm IV q6 hrs Monitor renal function, cultures/sensitvities, clinical progression   Height: 6' (182.9 cm) Weight: 89.3 kg (196 lb 13.9 oz) IBW/kg (Calculated) : 77.6  Temp (24hrs), Avg:98.2 F (36.8 C), Min:97.2 F (36.2 C), Max:98.7 F (37.1 C)  Recent Labs  Lab 08/22/20 2201 08/23/20 0323 08/23/20 2247 08/24/20 0052  WBC 6.8  --   --  6.3  CREATININE 4.13* 4.15*  --  4.55*  LATICACIDVEN  --   --  0.7 0.7    Estimated Creatinine Clearance: 15.6 mL/min (A) (by C-G formula based on SCr of 4.55 mg/dL (H)).    Allergies  Allergen Reactions  . Ivp Dye [Iodinated Diagnostic Agents]     Blood Pressure dropped----pt was pre-medicated with 13 hour prep and did fine with pre-meds--amy 03/09/11   . Sulfa Antibiotics Hives  . Sulfonamide Derivatives Hives and Itching  . Lisinopril Cough  . Metoprolol Nausea And Vomiting  . Statins Other (See Comments)    myalgias  . Propoxyphene N-Acetaminophen Other (See Comments)    Sharp pains- headache    Antimicrobials this admission: Cefepime 12/17 (Started last admission) >> 1/9 Zosyn 1/9 >>   Richardine Service, PharmD, BCPS PGY2 Cardiology Pharmacy Resident Phone: [phone] 08/24/2020  9:29 AM  Please check [email or web address] for unit-specific pharmacy phone numbers.

## 2020-08-24 NOTE — Progress Notes (Addendum)
PROGRESS NOTE    [name] DOA: 08/22/2020 PCP: Doreatha Lew, MD   Brief Narrative:  HPI: [name] is a 75 y.o. male with medical history significant of multiple medical problems including chronic systolic heart failure, DM with completication of neuropathy, PAD, ASVD diffuse. He has a h/o chronic shoulder problems. He was admitted 07/09/20-08/01/20 for septic arthritis both shoulders with GBS and Pseudomonas with septic shock. He was also found to have osteomyelitis right humerus. A PICC line was placed and the patient was discharged on 8 weeks of cefipeme 2g TID to end January 28th.  He was seen by Dr. Drucilla [name] for ID follow-up 08/21/20. At that encounter Dr. Tommy [name] recommended the patient be admitted for continue care, management of renal failure and continued orthopedic consultation. It took until late on the day of admission, 08/22/20, for a bed to be available.   Patient reports he has been able to eat and drink. He has been taking his medications. He reports that both shoulders cause him a lot of pain.     ED Course: Patient not seen in ED - direct admit.  Assessment & Plan:   Principal Problem:   Septic arthritis of shoulder ([place]) Active Problems:   Hyperlipidemia   Essential hypertension   BPH (benign prostatic hyperplasia)   Hyponatremia   Generalized anxiety disorder   Restless leg syndrome   Chronic systolic congestive heart failure, NYHA class 3 (HCC)   Hyperlipidemia associated with type 2 diabetes mellitus (HCC)   Diabetes mellitus type II, non insulin dependent (HCC)   Acute renal failure (ARF) (HCC)   Osteomyelitis of right humerus (HCC)   Encephalopathy acute   1. Septic arthritis - bilateral shoulder/osteomyelitis humerus. He has had multiple procedures and aspirations. MRI bilateral shoulder ordered and could not be completed yesterday due to patient's encephalopathy and inability to follow commands.  Seen by  orthopedics today, they opined that there is no surgical need at this point in time.  ID is on board.  Antibiotics switched from cefepime to Zosyn.  2. Acute renal failure: Patient's renal function was completely normal just 3 weeks ago.  Comes in with a creatinine of 4.13 which is worsening slowly.  Could be multifactorial such as secondary to sepsis, AIN.  Nephrology on board.  May potentially need dialysis.  Continue to hold Lasix and Entresto.  3. Chronic hyponatremia: Improving.  4. HTN-controlled.  Continue home medications.  However due to obtundation, he is unable to take any p.o. medications and since his blood pressure is within normal range so I will continue him on as needed IV hydralazine.  5.  Diabetes mellitus type 2: Blood sugar fairly controlled.  Continue SSI.  6. Chronic systolic heart failure - last Echo 07/10/20  EF 50-55%: Appears euvolemic.  Hold Lasix and Entresto.  7. HLD - continue home meds.  8. GAD - patient with complex psych history including anxiety, PTSD, paranois, depression.  Resume home medications once able to take p.o.  9. Restless leg syndrome - continue home meds.   10. BPH -continue home medications when able to take p.o.  11.  Acute uremic/toxic/multifactorial encephalopathy: Patient is completely obtunded however he is protecting his airway at this point in time.  His encephalopathy surely is multifactorial as nephrology and ID has opined.  This is likely contributed by cefepime, gabapentin and uremia however I would also like to rule out stroke.  I ordered MRI brain however I was told  by radiology that they could not do MRI yesterday due to patient's inability to follow commands and they cannot do MRI again today.  I have no other option but to order CT scan which I have done.  We will follow up on that.  Patient is significantly obtunded however he is protecting his airway.  Certainly he is at risk of decompensation requiring intubation to  protect his airways.  I have consulted PCCM to see him and stay with [place] on board in case he were to need intubation  12.  History of CAD s/p CABG and now with new onset atrial fibrillation: Early this morning, patient developed new onset atrial fibrillation but his rates were around 110.  At this point in time he is in sinus rhythm with rates around 85.  I had consulted cardiology this morning.  Certainly his A. fib could put him at risk of stroke as well.  If CT head negative for any intracranial bleed and if he develops atrial fibrillation again, he would be a candidate for anticoagulation.  However I will defer this to cardiology.  13.  Metabolic acidosis: Patient has metabolic acidosis for which he has been on bicarb drip.  Will defer to nephrology.  DVT prophylaxis:    Code Status: Full Code unfortunately, patient's next of kin/son is unreachable to make any decisions about CODE STATUS and patient's only contact who is his friend does not have legal authority to do so.  In the meantime, patient remains full code by default. Family Communication:  None present at bedside.  I initially tried calling his son's [name]) phone number however I was unsuccessful and there was no way to leave voicemail.  I then called the second person named [name], his friend who informed me that patient's son has been estranged for several years.  He never picks up the phone and he never comes to see his father.  She also informed me that since about 4 months, she has noted that patient has intermittent episodes of altered mental status.  Status is: Inpatient  Remains inpatient appropriate because:Inpatient level of care appropriate due to severity of illness   Dispo: The patient is from: Home              Anticipated d/c is to: Home              Anticipated d/c date is: > 3 days              Patient currently is not medically stable to d/c.        Estimated body mass index is 26.7 kg/m as  calculated from the following:   Height as of this encounter: 6' (1.829 m).   Weight as of this encounter: 89.3 kg.      Nutritional status:               Consultants:   ID  Orthopedics  Nephrology  PCCM  Cardiology  Procedures:   None  Antimicrobials:  Anti-infectives (From admission, onward)   Start     Dose/Rate Route Frequency Ordered Stop   08/24/20 1015  piperacillin-tazobactam (ZOSYN) IVPB 3.375 g        3.375 g 12.5 mL/hr over 240 Minutes Intravenous Every 8 hours 08/24/20 0925     08/23/20 1000  ceFEPIme (MAXIPIME) 2 g in sodium chloride 0.9 % 100 mL IVPB  Status:  Discontinued        2 g 200 mL/hr over 30 Minutes  Intravenous Every 24 hours 08/22/20 2355 08/24/20 0848   08/22/20 2245  ceFEPime (MAXIPIME) IVPB  Status:  Discontinued       Note to Pharmacy: Indication:  Septic arthritis/osteomyelitis  First Dose: No Last Day of Therapy:  09/12/2020 Labs - Once weekly:  CBC/D and BMP, Labs - Every other week:  ESR and CRP Method of administration: IV PushPharmacy to consult re: dose adjust with renal failure   2 g Intravenous Every 8 hours 08/22/20 2155 08/22/20 2354         Subjective: Patient seen and examined.  He was completely obtunded however protecting his airways.  Unable to open his eyes to voice and unable to communicate or follow commands.  Objective: Vitals:   08/23/20 2355 08/24/20 0139 08/24/20 0336 08/24/20 0727  BP: (!) 151/91 (!) 154/77 (!) 146/86 139/64  Pulse: (!) 109 (!) 110 (!) 111 (!) 114  Resp: _0 Temp: 98.3 F (36.8 C) 98.3 F (36.8 C) 98.4 F (36.9 C) 98.1 F (36.7 C)  TempSrc: Oral Oral Oral Axillary  SpO2: 96% 96% 98% 96%  Weight:      Height:        Intake/Output Summary (Last 24 hours) at 08/24/2020 1045 Last data filed at 08/24/2020 0933 Gross per 24 hour  Intake 940.67 ml  Output 1475 ml  Net -534.33 ml   Filed Weights   08/22/20 2059  Weight: 89.3 kg    Examination:  General exam:  Very obtunded but protecting his airways. Respiratory system: Diminished breath sounds with faint crackles. Respiratory effort normal. Cardiovascular system: S1 & S2 heard, RRR. No JVD, murmurs, rubs, gallops or clicks. No pedal edema. Gastrointestinal system: Abdomen is nondistended, soft and nontender. No organomegaly or masses felt. Normal bowel sounds heard. Central nervous system: Obtunded and not following commands. Skin: No rashes, lesions or ulcers.    Data Reviewed: I have personally reviewed following labs and imaging studies  CBC: Recent Labs  Lab 08/22/20 2201 08/24/20 0052  WBC 6.8 6.3  NEUTROABS 4.3 4.4  HGB 9.7* 10.3*  HCT 29.6* 32.5*  MCV 88.9 89.0  PLT 242 182   Basic Metabolic Panel: Recent Labs  Lab 08/22/20 2201 08/23/20 0323 08/24/20 0052  NA 129* 130* 133*  K 5.3* 5.0 4.6  CL 101 103 105  CO2 14* 15* 13*  GLUCOSE 87 104* 101*  BUN 86* 86* 89*  CREATININE 4.13* 4.15* 4.55*  CALCIUM 9.4 9.1 8.8*  MG  --   --  2.5*   GFR: Estimated Creatinine Clearance: 15.6 mL/min (A) (by C-G formula based on SCr of 4.55 mg/dL (H)). Liver Function Tests: Recent Labs  Lab 08/22/20 2201 08/24/20 0052  AST 17 15  ALT 16 13  ALKPHOS 106 100  BILITOT 0.7 0.7  PROT 7.3 6.9  ALBUMIN 2.6* 2.3*   No results for input(s): LIPASE, AMYLASE in the last 168 hours. No results for input(s): AMMONIA in the last 168 hours. Coagulation Profile: No results for input(s): INR, PROTIME in the last 168 hours. Cardiac Enzymes: No results for input(s): CKTOTAL, CKMB, CKMBINDEX, TROPONINI in the last 168 hours. BNP (last 3 results) No results for input(s): PROBNP in the last 8760 hours. HbA1C: Recent Labs    08/22/20 2201  HGBA1C 6.5*   CBG: Recent Labs  Lab 08/23/20 1349 08/23/20 1748 08/23/20 2012 08/23/20 2201 08/24/20 0624  GLUCAP 103* 95 92 107* 106*   Lipid Profile: No results for input(s): CHOL, HDL, LDLCALC, TRIG, CHOLHDL, LDLDIRECT  in the last 72  hours. Thyroid Function Tests: No results for input(s): TSH, T4TOTAL, FREET4, T3FREE, THYROIDAB in the last 72 hours. Anemia Panel: No results for input(s): VITAMINB12, FOLATE, FERRITIN, TIBC, IRON, RETICCTPCT in the last 72 hours. Sepsis Labs: Recent Labs  Lab 08/23/20 2247 08/24/20 0052  LATICACIDVEN 0.7 0.7    Recent Results (from the past 240 hour(s))  SARS CORONAVIRUS 2 (TAT 6-24 HRS) Nasopharyngeal Nasopharyngeal Swab     Status: None   Collection Time: 08/22/20 11:57 PM   Specimen: Nasopharyngeal Swab  Result Value Ref Range Status   SARS Coronavirus 2 NEGATIVE NEGATIVE Final    Comment: (NOTE) SARS-CoV-2 target nucleic acids are NOT DETECTED.  The SARS-CoV-2 RNA is generally detectable in upper and lower respiratory specimens during the acute phase of infection. Negative results do not preclude SARS-CoV-2 infection, do not rule out co-infections with other pathogens, and should not be used as the sole basis for treatment or other patient management decisions. Negative results must be combined with clinical observations, patient history, and epidemiological information. The expected result is Negative.  Fact Sheet for Patients: SugarRoll.be  Fact Sheet for Healthcare Providers: https://www.woods-mathews.com/  This test is not yet approved or cleared by the [place] FDA and  has been authorized for detection and/or diagnosis of SARS-CoV-2 by FDA under an Emergency Use Authorization (EUA). This EUA will remain  in effect (meaning this test can be used) for the duration of the COVID-19 declaration under Se ction 564(b)(1) of the Act, 21 U.S.C. section 360bbb-3(b)(1), unless the authorization is terminated or revoked sooner.  Performed at [facility], [place] [address], [place], [place] [address]   MRSA PCR Screening     Status: None   Collection Time: 08/23/20  9:30 PM   Specimen: Nasal Mucosa; Nasopharyngeal   Result Value Ref Range Status   MRSA by PCR NEGATIVE NEGATIVE Final    Comment:        The GeneXpert MRSA Assay (FDA approved for NASAL specimens only), is one component of a comprehensive MRSA colonization surveillance program. It is not intended to diagnose MRSA infection nor to guide or monitor treatment for MRSA infections. Performed at [facility], [place] [address] [place], [place] [address]       Radiology Studies: US RENAL  Result Date: 08/23/2020 CLINICAL DATA:  Acute renal failure EXAM: RENAL / URINARY TRACT ULTRASOUND COMPLETE COMPARISON:  CT 09/21/2010 FINDINGS: Right Kidney: Renal measurements: 13.3 x 6.2 x 5.3 cm = volume: 223 mL. Increased echogenicity. No mass or hydronephrosis visualized. Septated cyst measuring 3 cm in size. Left Kidney: Renal measurements: 14.0 x 8.6 x 7.2 cm = volume: 454 mL. Increased echogenicity. No mass or hydronephrosis visualized. Nonobstructing 4 mm stone in the midportion. Bladder: Appears normal for degree of bladder distention. Other: None. IMPRESSION: Enlarged echogenic kidneys consistent with acute nephritis. No hydronephrosis. Electronically Signed   By: Nelson Chimes M.D.   On: 08/23/2020 12:19   DG CHEST PORT 1 VIEW  Result Date: 08/23/2020 CLINICAL DATA:  75 year old male with shortness of breath EXAM: PORTABLE CHEST 1 VIEW COMPARISON:  Chest radiograph dated 07/13/2020. FINDINGS: Central venous line with tip at the cavoatrial junction. Cardiomegaly with mild vascular congestion. No focal consolidation, pleural effusion or pneumothorax. Median sternotomy wires. No acute osseous pathology. IMPRESSION: Cardiomegaly with mild vascular congestion. No focal consolidation. Electronically Signed   By: Anner Crete M.D.   On: 08/23/2020 21:57   DG Abd Portable 1V  Result Date:  08/23/2020 CLINICAL DATA:  MRI clearance EXAM: PORTABLE ABDOMEN - 1 VIEW COMPARISON:  None. FINDINGS: No metallic foreign body was identified in the abdomen or  pelvis. Advanced vascular calcifications are noted. The bowel gas pattern is nonobstructive. There is a moderate amount of stool in the colon. Degenerative changes are noted throughout the lumbar spine and bilateral hips. IMPRESSION: 1. No metallic foreign body identified in the abdomen or pelvis. Advanced vascular calcifications are noted. 2. Nonobstructive bowel gas pattern. Moderate stool burden. Electronically Signed   By: Constance Holster M.D.   On: 08/23/2020 22:04    Scheduled Meds: . ARIPiprazole  5 mg Oral Daily  . [START ON 08/25/2020] buPROPion  150 mg Oral Daily  . Chlorhexidine Gluconate Cloth  6 each Topical Daily  . ferrous sulfate  325 mg Oral Daily  . insulin aspart  0-9 Units Subcutaneous TID WC  . isosorbide mononitrate  60 mg Oral Daily  . metoprolol tartrate  5 mg Intravenous Q6H  . pantoprazole (PROTONIX) IV  40 mg Intravenous Q24H  . rOPINIRole  1 mg Oral BID  . senna  1 tablet Oral BID  . sodium bicarbonate  100 mEq Intravenous Q6H  . sodium bicarbonate  1,300 mg Oral TID  . tamsulosin  0.4 mg Oral BID  . ticagrelor  90 mg Oral BID   Continuous Infusions: . piperacillin-tazobactam (ZOSYN)  IV 3.375 g (08/24/20 1037)  . sodium chloride       LOS: 2 days   Time spent: 40 minutes, half of the time spent in coordination with other consulting services and talking to the family members/friend.   Darliss Cheney, MD [facility]  08/24/2020, 10:45 AM   To contact the attending provider between 7A-7P or the covering provider during after hours 7P-7A, please log into the web site [email or web address].

## 2020-08-24 NOTE — Consult Note (Signed)
CARDIOLOGY CONSULT NOTE  Patient ID: [name] MRN: [id number] DOB/AGE: 09/06/1945 75 y.o.  Admit date: 08/22/2020 Attending physician: Darliss Cheney, MD Primary Physician:  Patrecia Pour, Christean Grief, MD Outpatient Cardiologist: Dr. Laurance Flatten at [facility].  Inpatient Cardiologist: Rex Kras, DO, [facility]  Chief complaint: shoulder pain, kidney injury Reason of consultation: Atrial fibrillation  HPI:  [name] is a 75 y.o. Caucasian male who presents with a chief complaint of " shoulder pain and kidney injury." His past medical history and cardiovascular risk factors include: Established CAD with prior CABG in 2017, PAD status post aortobifemoral bypass by Dr. Trula Slade in 2012, hypertension, hyperlipidemia, type 2 diabetes mellitus, history of right innominate artery high-grade stenosis, history of left CEA in 2012 due to retinal artery occlusion,  history of non-STEMI (06/2020), hx of septic arthritis complicated by septic shock (06/2020), former smoker, advanced age.  At the time of evaluation no family present at bedside.  Patient follows up at [place] for his longitudinal cardiovascular care.  And saw my partner Dr. Einar Gip back in 2012.  We were consulted during his last hospitalization at [facility] in November 2021 as well.  Cardiology has been requested to evaluate the patient for atrial fibrillation.  Patient was hospitalized in November 2021 for septic arthritis along with septic shock and non-STEMI.  Thereafter he was discharged home with IV antibiotics and is following up with infectious disease as outpatient.  During his recent infectious disease office visit there was concerns for septic arthritis and acute kidney injury.  He was requested to come to the hospital for further evaluation and management.  Patient was a direct admit and later transferred to progressive care due to altered mental status.  He is currently being treated for  septic arthritis.  Earlier this morning according to nursing notes patient went into atrial fibrillation with ventricular rates between 100-110 bpm around 4:28 AM.  And therefore cardiology consultation was requested.  At the time of evaluation patient is normal sinus with a heart rate less than 100 bpm.  I reviewed the telemetry strips that are available and around 4:27 AM the underlying rhythm appears to be sinus mechanism with PACs.  EKG obtained earlier this morning shows sinus tachycardia at a rate of 107 bpm.  Given his acute kidney injury Delene Loll is currently being held and is being followed by nephrology.  Patient has established coronary artery disease and history of CABG. Following excerpt is from Dr. Tawanna Sat prior office note dated 04/29/2020 from [place] regarding his coronary anatomy.   "Coronary artery disease. He has known occlusion of RCA. His graft to the circumflex is down and he has tight stenosis in the proximal circumflex. He has good flow through the LIMA to LAD and reasonable collateralization of the RCA from the LAD. The circumflex territory remains un-revascularized. He had several attempts at revascularizing this territory percutaneously, and they all failed. Given reasonable flow to the LAD and RCA territories, I do not think there is benefit in considering repeat CABG. He is on dual antiplatelet therapy now due to recent non-ST elevation MI and his lipids are well-controlled on Praluent and pravastatin."   ALLERGIES: (As per EMR, not able to verify due to alter mental status) Allergies  Allergen Reactions  . Ivp Dye [Iodinated Diagnostic Agents]     Blood Pressure dropped----pt was pre-medicated with 13 hour prep and did fine with pre-meds--amy 03/09/11   . Sulfa Antibiotics Hives  . Sulfonamide Derivatives Hives  and Itching  . Lisinopril Cough  . Metoprolol Nausea And Vomiting  . Statins Other (See Comments)    myalgias  . Propoxyphene N-Acetaminophen Other  (See Comments)    Sharp pains- headache    PAST MEDICAL HISTORY: (As per EMR, not able to verify due to alter mental status) Past Medical History:  Diagnosis Date  . Acute renal failure (ARF) ([place]) 08/21/2020  . Anemia    NOS  . Anxiety   . 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 BPH (benign prostatic hypertrophy)   . Broken foot Oct. 12, 2013   Right foot Fx  . CAD (coronary artery disease)   . Carotid stenosis, left   . Cellulitis and abscess of leg 03/2018   right leg  . Chronic hyponatremia   . Chronic pain syndrome   . COPD (chronic obstructive pulmonary disease) ([place])   . Depression   . Diabetes mellitus   . Diabetic foot ulcer ([place])   . DJD (degenerative joint disease)    L wrist  . DM2 (diabetes mellitus, type 2) ([place])   . GERD (gastroesophageal reflux disease)   . HLD (hyperlipidemia)   . HTN (hypertension)   . Hx of blood transfusion reaction   . Hyponatremia   . Lumbar disc disease   . MI (mitral incompetence)   . Myocardial infarction ([place])    x 2  . Osteoporosis    tx per [facility], Dr Prudencio Burly, thought due to heavy steriod use after 1978  . Peripheral neuropathy   . Peripheral vascular disease ([place])   . Restless leg syndrome   . Spine fracture    hx, multiple  . Varicose veins   . 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 Vitamin D deficiency     PAST SURGICAL HISTORY: (As per EMR, not able to verify due to alter mental status) Past Surgical History:  Procedure Laterality Date  . ANGIOPLASTY    . aorta bifemoral bypass grafting  09/2010  . CARDIAC CATHETERIZATION N/A 09/19/2015   Procedure: Left Heart Cath and Coronary Angiography;  Surgeon: Adrian Prows, MD;  Location: [place] [facility];  Service: Cardiovascular;  Laterality: N/A;  . CARPAL TUNNEL RELEASE     right 2006/ left 2007  . CATARACT EXTRACTION     left 1996/ right 1997  . CORONARY ARTERY BYPASS GRAFT N/A 09/22/2015   Procedure: CORONARY ARTERY BYPASS GRAFTING (CABG)  times four using the right greater saphenous vein harvested endoscopically and the left internal mammary artery.  LIMA-LAD, SEQ SVG-DIAG & OM, SVG-PD.;  Surgeon: [name], MD;  Location: [place];  Service: Open Heart Surgery;  Laterality: N/A;  . I & D EXTREMITY Right 04/07/2018   Procedure: IRRIGATION AND DEBRIDEMENT ABSCESS RIGHT LEG;  Surgeon: Newt Minion, MD;  Location: [place];  Service: Orthopedics;  Laterality: Right;  . INCISION AND DRAINAGE OF WOUND Right 05/07/2020   Procedure: RIGHT LONG FINGER IRRIGATION AND DEBRIDEMENT AND AMPUTATION;  Surgeon: Iran Planas, MD;  Location: [place];  Service: Orthopedics;  Laterality: Right;  with IV sedation  . INCISION AND DRAINAGE OF WOUND Right 05/12/2020   Procedure: IRRIGATION AND DEBRIDEMENT WOUND and possible revision amputation;  Surgeon: Iran Planas, MD;  Location: [place];  Service: Orthopedics;  Laterality: Right;  needs 60 minutes  . IR FLUORO GUIDE CV LINE RIGHT  07/18/2020  . IR US GUIDE VASC ACCESS RIGHT  07/18/2020  . L  foot open repair jones fracture  2010    5th metetarsal   . left ankle ganglion cyst  1976  . left carotid endarterectomy  03/2011  . left cataract  1996   right - 1997  . left CTS  2006   R CTS - 2007  . left foot surgery  1998   R surgery/fracture - 1999  . left plantar ganglion cystectomy  1979  . left wrist/hand fusion  2008  . [place], 2004  . NASAL SINUS SURGERY     multiple- x8. last 1997 with obliteration  . REPAIR THORACIC AORTA  2017  . right hand fracture  1969  . ROTATOR CUFF REPAIR  2006   R, than repeat 2011, Dr Theda Sers  . SHOULDER ARTHROSCOPY Bilateral 07/17/2020   Procedure: Right shoulder arthroscopic lavage; Left shoulder aspiration.;  Surgeon: Justice Britain, MD;  Location: [facility];  Service: Orthopedics;  Laterality: Bilateral;  119mn  . SHOULDER ARTHROSCOPY WITH LABRAL REPAIR Left 07/10/2020   Procedure: SHOULDER ARTHROSCOPY WITH WASHOUT;  Surgeon: SJustice Britain MD;   Location: [facility];  Service: Orthopedics;  Laterality: Left;  . TRIGGER FINGER RELEASE Left 11/11/2014   Procedure: LEFT LONG FINGER RELEASE TRIGGER FINGER/A-1 PULLEY;  Surgeon: DMilly Jakob MD;  [place]  Service: Orthopedics;  Laterality: Left;  . vocal surgery  1996    FAMILY HISTORY: (As per EMR, not able to verify due to alter mental status) The patient family history includes Bleeding Disorder in his mother; Brain cancer (age of onset: 755 in his paternal grandfather; Depression in his mother; Diabetes in his sister; Diabetes (age of onset: 62 in his maternal grandfather; Diabetes (age of onset: 768 in his mother; Heart attack in his paternal grandmother; Heart disease in his maternal grandfather; Lung cancer (age of onset: 517 in his maternal grandmother; Lung cancer (age of onset: 564 in his father; Osteoporosis in his mother; Suicidality (age of onset: 430 in his son; Throat cancer in an other family member.   SOCIAL HISTORY: (As per EMR, not able to verify due to alter mental status) The patient  reports that he quit smoking about 31 years ago. His smoking use included cigarettes. He has a 45.00 pack-year smoking history. He has never used smokeless tobacco. He reports previous alcohol use. He reports that he does not use drugs.  MEDICATIONS: Current Outpatient Medications  Medication Instructions  . ACCU-CHEK FASTCLIX LANCETS MISC Use as directed to check blood glucose. Dx: E11.51   . acetaminophen (TYLENOL) 1,000 mg, Oral, Every 6 hours PRN  . albuterol (VENTOLIN HFA) 108 (90 Base) MCG/ACT inhaler 2 puffs, Inhalation, Every 6 hours PRN  . Antipyrine-Benzocaine (ALLERGEN OT) 1 Dose, OTIC (EAR), Daily  . ARIPiprazole (ABILIFY) 2 MG tablet 1 p.o. every morning for 1 week and then increase to 2 p.o. every morning.  .Marland Kitchenaspirin EC 81 mg, Oral, Daily  . Blood Glucose Monitoring Suppl (ACCU-CHEK AVIVA PLUS) w/Device KIT Use as directed to check blood glucose  daily. Dx: E11.51   . Brilinta 90 mg, Oral, 2 times daily  . buPROPion (WELLBUTRIN XL) 300 mg, Oral, Daily  . carvedilol (COREG) 3.125 mg, Oral, 2 times daily with meals  . ceFEPime (MAXIPIME) IVPB 2 g, Intravenous, Every 8 hours, Indication:  Septic arthritis/osteomyelitis <BR>First Dose: No<BR>Last Day of Therapy:  09/12/2020<BR>Labs - Once weekly:  CBC/D and BMP,<BR>Labs - Every other week:  ESR and CRP<BR>Method of administration: IV Push<BR>Method of administration may be changed at  the discretion of home infusion pharmacist based upon assessment of the patient and/or caregiver's ability to self-administer the medication ordered.  . celecoxib (CELEBREX) 200 MG capsule TAKE 1 CAPSULE(200 MG) BY MOUTH DAILY  . clonazePAM (KLONOPIN) 0.5 mg, Oral, 2 times daily PRN  . ENTRESTO 49-51 MG 1 tablet, Oral, 2 times daily  . furosemide (LASIX) 20 MG tablet TAKE 1 TABLET BY MOUTH TWICE DAILY  . gabapentin (NEURONTIN) 400 MG capsule 1 po q am, 1 po at lunch, 2 po qhs.  Marland Kitchen glucose blood (ACCU-CHEK AVIVA PLUS) test strip 1 each, Other, As needed, Use as instructed. Dx: E11.51   . IRON PO 1 capsule, Oral, Daily  . isosorbide mononitrate (IMDUR) 60 mg, Oral, Daily  . JANUVIA 100 MG tablet TAKE 1 TABLET BY MOUTH EVERY DAY  . Multiple Vitamin (MULTIVITAMIN WITH MINERALS) TABS tablet 1 tablet, Oral, Daily  . Narcan 4 mg, Nasal, As needed  . nitroGLYCERIN (NITROSTAT) 0.4 MG SL tablet PLACE 1 TABLET UNDER THE TONGUE EVERY 5 MINUTES AS NEEDED FOR CHEST PAIN  . oxyCODONE-acetaminophen (PERCOCET) 10-325 MG tablet 1 tablet, Oral, 2 times daily  . rOPINIRole (REQUIP) 1 mg, Oral, 2 times daily  . tamsulosin (FLOMAX) 0.4 mg, Oral, 2 times daily  . Vitamin D3 5,000 Units, Oral, Daily    REVIEW OF SYSTEMS: Review of Systems  Unable to perform ROS: mental status change   PHYSICAL EXAM: Vitals with BMI 08/24/2020 08/24/2020 08/24/2020  Height - - -  Weight - - -  BMI - - -  Systolic 323 557 322  Diastolic 57 72 60   Pulse 90 106 84     Intake/Output Summary (Last 24 hours) at 08/24/2020 1136 Last data filed at 08/24/2020 0933 Gross per 24 hour  Intake 940.67 ml  Output 1475 ml  Net -534.33 ml    CONSTITUTIONAL: Appears older than stated age, hemodynamically stable, awakens to noxious stimuli. SKIN: Skin is warm and dry. No rash noted. No cyanosis. No pallor. No jaundice HEAD: Normocephalic and atraumatic.   EYES: Pupils are equally reactive to light, no scleral icterus MOUTH/THROAT: Grossly appears dry oral membranes.  NECK: No JVD present. No thyromegaly noted. No carotid bruits  LYMPHATIC: No visible cervical adenopathy.  CHEST equal rise and fall in chest cavity.  No intercostal retractions.  Central venous line noted.  Area is clean dry and intact LUNGS: Laterally clear to auscultation.  No stridor. No wheezes. No rales.  CARDIOVASCULAR: Regular, positive G2-R4, soft holosystolic murmur heard at the apex, no gallops or rubs. ABDOMINAL: Soft, nontender, nondistended, no apparent ascites.  EXTREMITIES: No peripheral edema, no significant erythema noted at the bilateral shoulders. HEMATOLOGIC: No significant bruising NEUROLOGIC: Appears to be somnolent but awakens to verbal and noxious stimuli.    RADIOLOGY: CT HEAD WO CONTRAST  Result Date: 08/24/2020 CLINICAL DATA:  Obtunded, rule out stroke EXAM: CT HEAD WITHOUT CONTRAST TECHNIQUE: Contiguous axial images were obtained from the base of the skull through the vertex without intravenous contrast. COMPARISON:  12/31/2019 FINDINGS: Brain: No evidence of acute infarction, hemorrhage, hydrocephalus, extra-axial collection or mass lesion/mass effect. Chronic small vessel ischemia in the cerebral white matter. Small remote left cerebellar infarction. Brain atrophy. Vascular: No hyperdense vessel or unexpected calcification. Skull: Negative for fracture or focal lesion. Sinuses/Orbits: Postoperative paranasal sinuses, including frontal sinus obliteration.  IMPRESSION: Senescent changes without acute or reversible finding. Electronically Signed   By: Monte Fantasia M.D.   On: 08/24/2020 11:26   US RENAL  Result Date:  08/23/2020 CLINICAL DATA:  Acute renal failure EXAM: RENAL / URINARY TRACT ULTRASOUND COMPLETE COMPARISON:  CT 09/21/2010 FINDINGS: Right Kidney: Renal measurements: 13.3 x 6.2 x 5.3 cm = volume: 223 mL. Increased echogenicity. No mass or hydronephrosis visualized. Septated cyst measuring 3 cm in size. Left Kidney: Renal measurements: 14.0 x 8.6 x 7.2 cm = volume: 454 mL. Increased echogenicity. No mass or hydronephrosis visualized. Nonobstructing 4 mm stone in the midportion. Bladder: Appears normal for degree of bladder distention. Other: None. IMPRESSION: Enlarged echogenic kidneys consistent with acute nephritis. No hydronephrosis. Electronically Signed   By: Nelson Chimes M.D.   On: 08/23/2020 12:19   DG CHEST PORT 1 VIEW  Result Date: 08/23/2020 CLINICAL DATA:  75 year old male with shortness of breath EXAM: PORTABLE CHEST 1 VIEW COMPARISON:  Chest radiograph dated 07/13/2020. FINDINGS: Central venous line with tip at the cavoatrial junction. Cardiomegaly with mild vascular congestion. No focal consolidation, pleural effusion or pneumothorax. Median sternotomy wires. No acute osseous pathology. IMPRESSION: Cardiomegaly with mild vascular congestion. No focal consolidation. Electronically Signed   By: Anner Crete M.D.   On: 08/23/2020 21:57   DG Abd Portable 1V  Result Date: 08/23/2020 CLINICAL DATA:  MRI clearance EXAM: PORTABLE ABDOMEN - 1 VIEW COMPARISON:  None. FINDINGS: No metallic foreign body was identified in the abdomen or pelvis. Advanced vascular calcifications are noted. The bowel gas pattern is nonobstructive. There is a moderate amount of stool in the colon. Degenerative changes are noted throughout the lumbar spine and bilateral hips. IMPRESSION: 1. No metallic foreign body identified in the abdomen or pelvis. Advanced  vascular calcifications are noted. 2. Nonobstructive bowel gas pattern. Moderate stool burden. Electronically Signed   By: Constance Holster M.D.   On: 08/23/2020 22:04    LABORATORY DATA: Lab Results  Component Value Date   WBC 6.3 08/24/2020   HGB 10.3 (L) 08/24/2020   HCT 32.5 (L) 08/24/2020   MCV 89.0 08/24/2020   PLT 255 08/24/2020    Recent Labs  Lab 08/24/20 0052  NA 133*  K 4.6  CL 105  CO2 13*  BUN 89*  CREATININE 4.55*  CALCIUM 8.8*  PROT 6.9  BILITOT 0.7  ALKPHOS 100  ALT 13  AST 15  GLUCOSE 101*    Lipid Panel     Component Value Date/Time   CHOL 139 03/21/2018 0810   TRIG 71 03/21/2018 0810   HDL 37 (L) 03/21/2018 0810   CHOLHDL 3.8 03/21/2018 0810   VLDL 17.0 11/15/2014 1003   LDLCALC 86 03/21/2018 0810    BNP (last 3 results) Recent Labs    05/02/20 1620 07/11/20 0425 08/22/20 2201  BNP 626.2* 2,329.1* 1,652.4*    HEMOGLOBIN A1C Lab Results  Component Value Date   HGBA1C 6.5 (H) 08/22/2020   MPG 139.85 08/22/2020    Cardiac Panel (last 3 results) No results for input(s): CKTOTAL, CKMB, TROPONINIHS, RELINDX in the last 72 hours.  TSH Recent Labs    07/09/20 2356  TSH 0.996      CARDIAC DATABASE: Coronary artery bypass grafting 09/2015: LIMA to the LAD, sequential SVG graft to diagonal OM, SVG to PDA  EKG: 08/24/2020: Sinus tachycardia, 107 bpm normal axis, nonspecific ST-T abnormality, without underlying injury pattern.  Echocardiogram: 07/10/2020 1. Technically difficult study with limited views 2. Left ventricular ejection fraction, by estimation, is 50 to 55%. The left ventricle has low normal function. Left ventricular diastolic parameters were normal. 3. Right ventricular systolic function is normal. The right ventricular size is  mildly enlarged. 4. Left atrial size was mildly dilated. 5. The mitral valve is normal in structure. Mild mitral valve regurgitation. 6. The aortic valve is tricuspid. Aortic valve  regurgitation is not visualized. Mild to moderate aortic valve sclerosis/calcification is present, without any evidence of aortic stenosis.   IMPRESSION & RECOMMENDATIONS: [name] is a 75 y.o. Caucasian male whose past medical history and cardiovascular risk factors include: Established CAD with prior CABG in 2017, PAD status post aortobifemoral bypass by Dr. Trula Slade in 2012, hypertension, hyperlipidemia, type 2 diabetes mellitus, history of right innominate artery high-grade stenosis, history of left CEA in 2012 due to retinal artery occlusion,  history of non-STEMI (06/2020), hx of septic arthritis complicated by septic shock (06/2020), former smoker, advanced age.  Established atherosclerosis coronary artery disease status post CABG: Patient follows up longitudinally at [place] with Dr. Laurance Flatten and based on his prior office notes that are available in Care Everywhere patient's coronary anatomy as described above.  I do not have films for review. Recommend guideline directed medical therapy. Currently Entresto and Lasix are held due to acute kidney injury, agree. Continue antianginal therapy: Imdur 60 mg p.o. daily and beta-blocker therapy. Increase carvedilol to 6.25 mg p.o. twice daily. Continue telemetry Continue aspirin and Brilinta given his underlying CAD and recent non-STEMI. In regards to atrial fibrillation I have personally reviewed the telemetry records in progressive care unit that are available as well as the morning EKG.  His underlying rhythm appears to be sinus mechanism without clear identification of atrial fibrillation.  Currently patient is NSR.  However, given his multiple risk factors, acute illness, acute kidney injury, and septic arthritis he is predisposed to going into atrial fibrillation.  If he does develop Afib and there are no absolute contraindications to anticoagulation start IV heparin per A. fib protocol dosed by pharmacy. Based on  his last LVEF may consider IV Cardizem drip for rate control as well. Recommendations conveyed to Dr. Doristine Bosworth over the phone.  Acute encephalopathy, etiology unknown differential diagnosis includes but not limited to acute kidney injury, septic arthritis. Patient was transferred to progressive care overnight and critical care medicine has been consulted.  Will defer management to primary and CCM.  Acute renal failure: Present on admission. Creatinine on admission: 4.13 mg/dL Last three serum creatinine: 4.13-->4.15-->4.55 Nephrology following. Avoid nephrotoxic agents. May hold Entresto and Lasix for now until kidney function improves.  Hypertension with chronic kidney disease: Blood pressure well controlled. Continue current home medications that are not nephrotoxic. May consider the addition of oral hydralazine as the patient is already on Imdur. Currently managed by primary team.  Chronic HFpEF, stage C, NYHA class II: Overall euvolemic. Continue medical management  Hyperlipidemia, mixed:  Home medications include pravastatin and Praluent. Continue to monitor lipids, as outpatient.  Diabetes mellitus type 2 with complications of CKD, hyperlipidemia, PAD, CAD s/p CABG. Currently being managed by primary team  Continue care regarding his chronic comorbid conditions.  Thank you for allowing [place] to participate in the care of [name] we will follow the patient with you.  Once discharged, longitudinal follow-up care will be deferred to Dr. Laurance Flatten at [facility].  However, locally patient is more than welcome to follow-up with our practice as well.  Total encounter time 86 minutes. *Total Encounter Time as defined by the Centers for Medicare and Medicaid Services includes, in addition to the face-to-face time of a patient visit (documented in the note above) non-face-to-face time:  obtaining and reviewing outside history, ordering and reviewing medications,  tests or procedures, care coordination (communications with other health care professionals or caregivers) and documentation in the medical record.  This note was created using a voice recognition software as a result there may be grammatical errors inadvertently enclosed that do not reflect the nature of this encounter. Every attempt is made to correct such errors.  Mechele Claude [facility]  Pager: [phone] Office: [phone] 08/24/2020, 11:36 AM

## 2020-08-25 ENCOUNTER — Inpatient Hospital Stay (HOSPITAL_COMMUNITY): Payer: Medicare Other

## 2020-08-25 DIAGNOSIS — M00211 Other streptococcal arthritis, right shoulder: Secondary | ICD-10-CM

## 2020-08-25 DIAGNOSIS — B951 Streptococcus, group B, as the cause of diseases classified elsewhere: Secondary | ICD-10-CM | POA: Diagnosis not present

## 2020-08-25 DIAGNOSIS — M00819 Arthritis due to other bacteria, unspecified shoulder: Secondary | ICD-10-CM | POA: Diagnosis not present

## 2020-08-25 DIAGNOSIS — N179 Acute kidney failure, unspecified: Secondary | ICD-10-CM | POA: Diagnosis not present

## 2020-08-25 DIAGNOSIS — R4182 Altered mental status, unspecified: Secondary | ICD-10-CM

## 2020-08-25 DIAGNOSIS — Z20822 Contact with and (suspected) exposure to covid-19: Secondary | ICD-10-CM | POA: Diagnosis not present

## 2020-08-25 DIAGNOSIS — B965 Pseudomonas (aeruginosa) (mallei) (pseudomallei) as the cause of diseases classified elsewhere: Secondary | ICD-10-CM | POA: Diagnosis not present

## 2020-08-25 DIAGNOSIS — Z1621 Resistance to vancomycin: Secondary | ICD-10-CM | POA: Diagnosis not present

## 2020-08-25 DIAGNOSIS — M869 Osteomyelitis, unspecified: Secondary | ICD-10-CM

## 2020-08-25 DIAGNOSIS — J9601 Acute respiratory failure with hypoxia: Secondary | ICD-10-CM | POA: Diagnosis not present

## 2020-08-25 DIAGNOSIS — M86111 Other acute osteomyelitis, right shoulder: Secondary | ICD-10-CM

## 2020-08-25 HISTORY — PX: IR FLUORO GUIDE CV LINE LEFT: IMG2282

## 2020-08-25 HISTORY — PX: IR US GUIDE VASC ACCESS LEFT: IMG2389

## 2020-08-25 LAB — GLUCOSE, CAPILLARY
Glucose-Capillary: 91 mg/dL (ref 70–99)
Glucose-Capillary: 127 mg/dL — ABNORMAL HIGH (ref 70–99)
Glucose-Capillary: 101 mg/dL — ABNORMAL HIGH (ref 70–99)
Glucose-Capillary: 113 mg/dL — ABNORMAL HIGH (ref 70–99)

## 2020-08-25 LAB — COMPREHENSIVE METABOLIC PANEL
ALT: 13 U/L (ref 0–44)
AST: 19 U/L (ref 15–41)
Albumin: 2.2 g/dL — ABNORMAL LOW (ref 3.5–5.0)
Alkaline Phosphatase: 83 U/L (ref 38–126)
Anion gap: 17 — ABNORMAL HIGH (ref 5–15)
BUN: 94 mg/dL — ABNORMAL HIGH (ref 8–23)
CO2: 17 mmol/L — ABNORMAL LOW (ref 22–32)
Calcium: 8.7 mg/dL — ABNORMAL LOW (ref 8.9–10.3)
Chloride: 108 mmol/L (ref 98–111)
Creatinine, Ser: 5.17 mg/dL — ABNORMAL HIGH (ref 0.61–1.24)
GFR, Estimated: 11 mL/min — ABNORMAL LOW (ref >60)
Glucose, Bld: 108 mg/dL — ABNORMAL HIGH (ref 70–99)
Potassium: 3.8 mmol/L (ref 3.5–5.1)
Sodium: 142 mmol/L (ref 135–145)
Total Bilirubin: 1 mg/dL (ref 0.3–1.2)
Total Protein: 6.6 g/dL (ref 6.5–8.1)

## 2020-08-25 LAB — CBC WITH DIFFERENTIAL/PLATELET
Abs Immature Granulocytes: 0.04 10*3/uL (ref 0.00–0.07)
Basophils Absolute: 0.1 10*3/uL (ref 0.0–0.1)
Basophils Relative: 1 %
Eosinophils Absolute: 0.7 10*3/uL — ABNORMAL HIGH (ref 0.0–0.5)
Eosinophils Relative: 10 %
HCT: 30.1 % — ABNORMAL LOW (ref 39.0–52.0)
Hemoglobin: 9.7 g/dL — ABNORMAL LOW (ref 13.0–17.0)
Immature Granulocytes: 1 %
Lymphocytes Relative: 10 %
Lymphs Abs: 0.7 10*3/uL (ref 0.7–4.0)
MCH: 28.5 pg (ref 26.0–34.0)
MCHC: 32.2 g/dL (ref 30.0–36.0)
MCV: 88.5 fL (ref 80.0–100.0)
Monocytes Absolute: 1 10*3/uL (ref 0.1–1.0)
Monocytes Relative: 14 %
Neutro Abs: 4.6 10*3/uL (ref 1.7–7.7)
Neutrophils Relative %: 64 %
Platelets: 270 10*3/uL (ref 150–400)
RBC: 3.4 MIL/uL — ABNORMAL LOW (ref 4.22–5.81)
RDW: 17.5 % — ABNORMAL HIGH (ref 11.5–15.5)
WBC: 7 10*3/uL (ref 4.0–10.5)
nRBC: 0 % (ref 0.0–0.2)

## 2020-08-25 MED ORDER — ORAL CARE MOUTH RINSE
15.0000 mL | Freq: Two times a day (BID) | OROMUCOSAL | Status: DC
  Administered 2020-08-28 – 2020-09-05 (×5): 15 mL via OROMUCOSAL

## 2020-08-25 MED ORDER — CHLORHEXIDINE GLUCONATE 0.12 % MT SOLN
15.0000 mL | Freq: Two times a day (BID) | OROMUCOSAL | Status: DC
  Administered 2020-08-26 – 2020-09-06 (×19): 15 mL via OROMUCOSAL
  Filled 2020-08-25 (×16): qty 15

## 2020-08-25 MED ORDER — HEPARIN SODIUM (PORCINE) 1000 UNIT/ML IJ SOLN
INTRAMUSCULAR | Status: AC
  Filled 2020-08-25: qty 1

## 2020-08-25 MED ORDER — LIDOCAINE HCL 1 % IJ SOLN
INTRAMUSCULAR | Status: DC | PRN
  Administered 2020-08-25: 10 mL

## 2020-08-25 MED ORDER — PIPERACILLIN-TAZOBACTAM IN DEX 2-0.25 GM/50ML IV SOLN
2.2500 g | Freq: Three times a day (TID) | INTRAVENOUS | Status: DC
  Administered 2020-08-25 – 2020-09-01 (×19): 2.25 g via INTRAVENOUS
  Filled 2020-08-25 (×24): qty 50

## 2020-08-25 MED ORDER — LIDOCAINE HCL 1 % IJ SOLN
INTRAMUSCULAR | Status: AC
  Filled 2020-08-25: qty 20

## 2020-08-25 MED ORDER — SODIUM CHLORIDE 0.9 % IV SOLN
INTRAVENOUS | Status: DC | PRN
  Administered 2020-08-26: 250 mL via INTRAVENOUS
  Administered 2020-08-27: 500 mL via INTRAVENOUS

## 2020-08-25 NOTE — Progress Notes (Addendum)
[name] for Infectious Disease   Reason for visit: Follow up on osteomyelitis  Interval History: remains altered, in renal failure, on pip tazo.  Physical Exam: Constitutional:  Vitals:   08/25/20 0758 08/25/20 1102  BP: (!) 146/63 97/73  Pulse: 75 86  Resp: 17   Temp: 98.6 F (37 C) 98.4 F (36.9 C)  SpO2: 97% 95%   patient appears in NAD Respiratory: Normal respiratory effort; CTA B Cardiovascular: RRR GI: soft, nt, nd Neuro: alert, does not respond   Review of Systems: Unable to be assessed due to mental status  Lab Results  Component Value Date   WBC 7.0 08/25/2020   HGB 9.7 (L) 08/25/2020   HCT 30.1 (L) 08/25/2020   MCV 88.5 08/25/2020   PLT 270 08/25/2020    Lab Results  Component Value Date   CREATININE 5.17 (H) 08/25/2020   BUN 94 (H) 08/25/2020   NA 142 08/25/2020   K 3.8 08/25/2020   CL 108 08/25/2020   CO2 17 (L) 08/25/2020    Lab Results  Component Value Date   ALT 13 08/25/2020   AST 19 08/25/2020   ALKPHOS 83 08/25/2020     Microbiology: Recent Results (from the past 240 hour(s))  SARS CORONAVIRUS 2 (TAT 6-24 HRS) Nasopharyngeal Nasopharyngeal Swab     Status: None   Collection Time: 08/22/20 11:57 PM   Specimen: Nasopharyngeal Swab  Result Value Ref Range Status   SARS Coronavirus 2 NEGATIVE NEGATIVE Final    Comment: (NOTE) SARS-CoV-2 target nucleic acids are NOT DETECTED.  The SARS-CoV-2 RNA is generally detectable in upper and lower respiratory specimens during the acute phase of infection. Negative results do not preclude SARS-CoV-2 infection, do not rule out co-infections with other pathogens, and should not be used as the sole basis for treatment or other patient management decisions. Negative results must be combined with clinical observations, patient history, and epidemiological information. The expected result is Negative.  Fact Sheet for Patients: SugarRoll.be  Fact Sheet for  Healthcare Providers: https://www.woods-mathews.com/  This test is not yet approved or cleared by the [place] FDA and  has been authorized for detection and/or diagnosis of SARS-CoV-2 by FDA under an Emergency Use Authorization (EUA). This EUA will remain  in effect (meaning this test can be used) for the duration of the COVID-19 declaration under Se ction 564(b)(1) of the Act, 21 U.S.C. section 360bbb-3(b)(1), unless the authorization is terminated or revoked sooner.  Performed at [facility], [place] [address] [place], [place] [address]   MRSA PCR Screening     Status: None   Collection Time: 08/23/20  9:30 PM   Specimen: Nasal Mucosa; Nasopharyngeal  Result Value Ref Range Status   MRSA by PCR NEGATIVE NEGATIVE Final    Comment:        The GeneXpert MRSA Assay (FDA approved for NASAL specimens only), is one component of a comprehensive MRSA colonization surveillance program. It is not intended to diagnose MRSA infection nor to guide or monitor treatment for MRSA infections. Performed at [facility], [place] [address], [place], [place] [address]     Impression/Plan:  1.  Right shoulder septic arthritis, osteomyelitis s/p debridement 07/17/20 - he remains on antibiotics, now with piptazo and plan for 8 weeks total antibiotics.  Pseudomonas positive.  He has been seen by Dr. Onnie Graham and no plan for any further debridement.  Inflammatory markers improving, no clinical concerns on exam for his shoulders, though limited by  his AMS.  2. Left shoulder septic arthritis, s/p washout 07/10/20 - growth with Group B strep and on pip/tazo for coverage as above.    3.  AMS - new renal failure.  Likely multifactoral and have stopped cefepime with potential for medication-induced encephalopathy.

## 2020-08-25 NOTE — Progress Notes (Signed)
[name]  MRN: [id number] DOB/Age: May 10, 1946 75 y.o. Physician: Ander Slade, M.D.    Orthopedic history review:  [name] is well-known to our practice most recently following bilateral shoulder arthroscopic debridement for the treatment of septic arthritis.  He had an initial arthroscopic lavage and debridement of his left shoulder on 07/10/2020 and then returned to surgery on 07/17/2020 for a right shoulder arthroscopic debridement as well as aspiration and lavage of his left shoulder.  Initial cultures at the time of his presentation back in November 2021 grew out strep species from the left shoulder and a Pseudomonas from the right.  He has subsequently been followed by infectious disease.  He presented to my office for a routine follow-up visit on 08/01/2020 at which time he appeared severely ill and cachectic and I referred him to the emergency room.  He was evaluated at that time and the staff in the emergency room did not identify any obvious acute medical or metabolic condition that would indicate the need for admission.  More recently he had a follow-up visit in the infectious disease clinic and Dr. [name] felt that his overall status appeared to have deteriorated and he was subsequently referred for admission to the hospital now with apparent metabolic encephalopathy.  At his initial evaluation back in November he was noted to have significant erythema and induration globally about the left shoulder and extending into the upper arm with a tense effusion.  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  He has had shoulder pain for many years which is his baseline related to his severe arthritis.   Subjective: On examination this morning patient is sitting in bed obtunded and uncooperative and minimally responsive.  He is unable to  follow commands.   Vital Signs Temp:  [98.2 F (36.8 C)-99 F (37.2 C)] 98.6 F (37 C) (01/10 0758) Pulse Rate:  [64-106] 75 (01/10 0758) Resp:  [10-17] 17 (01/10 0758) BP: (133-159)/(52-72) 146/63 (01/10 0758) SpO2:  [95 %-100 %] 97 % (01/10 0758)  Lab Results Recent Labs    08/24/20 0052 08/25/20 0444  WBC 6.3 7.0  HGB 10.3* 9.7*  HCT 32.5* 30.1*  PLT 255 270   BMET Recent Labs    08/24/20 0052 08/25/20 0444  NA 133* 142  K 4.6 3.8  CL 105 108  CO2 13* 17*  GLUCOSE 101* 108*  BUN 89* 94*  CREATININE 4.55* 5.17*  CALCIUM 8.8* 8.7*   INR  Date Value Ref Range Status  07/09/2020 1.3 (H) 0.8 - 1.2 Final    Comment:    (NOTE) INR goal varies based on device and disease states. Performed at [facility], [place] [address] [place], [place] [address]      Exam  Inspection of the left shoulder reveals very minimal swelling.  There is no localized erythema or induration.  No increased temperature to palpation.  Significant pain with passive motion of the shoulder and crepitance is noted as would be expected with his known severe end-stage osteoarthritis and chronic acromial spine fracture.  Right shoulder demonstrates a mild to moderate effusion.  However there is no increased temperature, erythema, or induration.  Again crepitance is noted with passive motion as would be expected given his severe arthritis and associated rotator cuff tear arthropathy.  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  Clinical examination today shows significant improvements in the overall appearance of the shoulders.  Based on the normal white count and the improved clinical examination I do not feel that a septic process in either shoulder is responsible for his current altered mental status or overall medical  decline.  Plan Regarding the shoulders I would recommend continuation of the previously outlined course of IV antibiotics per infectious disease.  I do not see any conditions about either shoulder that would indicate a significant role in his current medical decline and certainly do not appreciate any findings to suggest the need for orthopedic surgical intervention into either shoulder.   Marland KitchenKEVIN M Avika Carbine 08/25/2020, 10:27 AM   Contact # [phone]

## 2020-08-25 NOTE — Progress Notes (Signed)
Back from IR by bed lethargic but arousal. =

## 2020-08-25 NOTE — Progress Notes (Signed)
Nephrology Follow-Up Consult note   Assessment/Recommendations: [name] is a/an 75 y.o. male with a past medical history significant for HFrEF, DM 2, neuropathy, PAD, CAD, chronic osteomyelitis who presents with AKI and AMS  AKI, non-oliguric: Unclear cause at this time.  Creatinine only slightly worse at 5.2 today.  Renal ultrasound without hydronephrosis but enlarged kidneys unclear cause.  Most likely NSAID use and Entresto contributed to AKI.  Possible intermittent hypotension. AIN in differential -given persistent AMS (which may very well be from uremia), will start hemodialysis, will consult IR for temp line. Slow start protocol w/ HD likely tomorrow once catheter is in. 2 physician consent -Dose medications appropriately as below -Continue to monitor daily Cr, Dose meds for GFR -Monitor Daily I/Os, Daily weight  -Maintain MAP>65 for optimal renal perfusion.  -Avoid nephrotoxic medications including NSAIDs and Vanc/Zosyn combo  Altered mental status: Altered on my exam but protecting his airway.    Likely multifactorial: uremia with gabapentin and cefepime contributing -Hold further gabapentin and benzodiazepine -ID assisting with switching cefepime to Zosyn -starting HD  Hyponatremia, improving: Sodium 133.  Likely related to AKI with free water retention.  Continue to monitor  Metabolic acidosis: Likely associated with AKI.    Bicarb 13 today.  2amps of bicarb every 6 hours x 2 and oral bicarbonate 1300 mg 3 times daily.  Continue to monitor  Osteomyelitis: of shoulders.    Switched to zosyn.  ID and Ortho following  CHF/HTN: Reduced ejection fraction.  Can continue Imdur and carvedilol.  Hold Entresto for now.    Stopping fluids today due to crackles on exam.  Room air   Recommendations conveyed to primary service.    [facility] 08/25/2020 10:07 AM  ___________________________________________________________  CC: AKI  Interval  History/Subjective:  AMS persistent. Per primary team, slightly better today but not by much. UOP 2.7L.   Medications:  Current Facility-Administered Medications  Medication Dose Route Frequency Provider Last Rate Last Admin  . acetaminophen (TYLENOL) tablet 1,000 mg  1,000 mg Oral Q6H PRN Neena Rhymes, MD   1,000 mg at 08/23/20 0931  . albuterol (VENTOLIN HFA) 108 (90 Base) MCG/ACT inhaler 2 puff  2 puff Inhalation Q6H PRN Norins, Heinz Knuckles, MD      . ARIPiprazole (ABILIFY) tablet 5 mg  5 mg Oral Daily Norins, Heinz Knuckles, MD      . aspirin EC tablet 81 mg  81 mg Oral Daily Tolia, Sunit, DO      . buPROPion (WELLBUTRIN XL) 24 hr tablet 150 mg  150 mg Oral Daily Pahwani, Ravi, MD      . Chlorhexidine Gluconate Cloth 2 % PADS 6 each  6 each Topical Daily Norins, Heinz Knuckles, MD   6 each at 08/24/20 [phone]  . ferrous sulfate tablet 325 mg  325 mg Oral Daily Norins, Heinz Knuckles, MD      . hydrALAZINE (APRESOLINE) injection 10 mg  10 mg Intravenous Q6H PRN Pahwani, Einar Grad, MD      . insulin aspart (novoLOG) injection 0-9 Units  0-9 Units Subcutaneous TID WC Norins, Heinz Knuckles, MD      . isosorbide mononitrate (IMDUR) 24 hr tablet 60 mg  60 mg Oral Daily Norins, Heinz Knuckles, MD      . metoprolol tartrate (LOPRESSOR) injection 5 mg  5 mg Intravenous Q6H Darliss Cheney, MD   5 mg at 08/25/20 0541  . nitroGLYCERIN (NITROSTAT) SL tablet 0.4 mg  0.4 mg Sublingual Q5 min PRN  Norins, Heinz Knuckles, MD      . pantoprazole (PROTONIX) injection 40 mg  40 mg Intravenous Q24H Darliss Cheney, MD   40 mg at 08/24/20 1041  . piperacillin-tazobactam (ZOSYN) IVPB 2.25 g  2.25 g Intravenous Q6H KoAltha Harm, RPH 100 mL/hr at 08/25/20 0542 2.25 g at 08/25/20 0542  . rOPINIRole (REQUIP) tablet 1 mg  1 mg Oral BID Norins, Heinz Knuckles, MD   1 mg at 08/22/20 2314  . senna (SENOKOT) tablet 8.6 mg  1 tablet Oral BID Neena Rhymes, MD   8.6 mg at 08/22/20 2314  . sodium bicarbonate tablet 1,300 mg  1,300 mg Oral TID Reesa Chew,  MD      . sodium chloride flush (NS) 0.9 % injection 10-40 mL  10-40 mL Intracatheter PRN Norins, Heinz Knuckles, MD      . tamsulosin [facility]) capsule 0.4 mg  0.4 mg Oral BID Neena Rhymes, MD   0.4 mg at 08/22/20 2314  . ticagrelor (BRILINTA) tablet 90 mg  90 mg Oral BID Neena Rhymes, MD   90 mg at 08/22/20 2315      Review of Systems: Unable to assess due to the patient's AMS  Physical Exam: Vitals:   08/25/20 0300 08/25/20 0758  BP: (!) 153/60 (!) 146/63  Pulse: 75 75  Resp: 16 17  Temp: 98.4 F (36.9 C) 98.6 F (37 C)  SpO2: 96% 97%   No intake/output data recorded.  Intake/Output Summary (Last 24 hours) at 08/25/2020 1007 Last data filed at 08/25/2020 0600 Gross per 24 hour  Intake 100 ml  Output 1300 ml  Net -1200 ml   Constitutional: Ill-appearing, lying in bed, not interactive ENMT: ears and nose without scars or lesions, dry mucosal membranes CV: Tachycardic, no edema bilateral lower extremities Respiratory: Crackles in the bilateral bases, normal work of breathing Gastrointestinal: soft, non-tender, no palpable masses or hernias Skin: no visible lesions or rashes Psych: Altered, unable to answer questions appropriately, not following commands, intermittently tracking   Test Results I personally reviewed new and old clinical labs and radiology tests Lab Results  Component Value Date   NA 142 08/25/2020   K 3.8 08/25/2020   CL 108 08/25/2020   CO2 17 (L) 08/25/2020   BUN 94 (H) 08/25/2020   CREATININE 5.17 (H) 08/25/2020   GFR 118.86 03/19/2015   GLU 116 12/01/2017   CALCIUM 8.7 (L) 08/25/2020   ALBUMIN 2.2 (L) 08/25/2020   PHOS 3.2 07/18/2020

## 2020-08-25 NOTE — Progress Notes (Signed)
Progress Note  Patient Name: [name] Date of Encounter: 08/25/2020  Attending physician: Darliss Cheney, MD Primary care provider: Patrecia Pour, Christean Grief, MD Primary Cardiologist: [facility]  Subjective: [name] is a 75 y.o. male who was seen and examined at bedside at approximately 845am No events overnight. Patient is more awake compared to yesterday, but unable to participate in meaningful conversation. Case discussed and reviewed with his nurse.  Objective: Vital Signs in the last 24 hours: Temp:  [98.2 F (36.8 C)-99 F (37.2 C)] 98.6 F (37 C) (01/10 0758) Pulse Rate:  [64-106] 75 (01/10 0758) Resp:  [10-17] 17 (01/10 0758) BP: (133-159)/(52-72) 146/63 (01/10 0758) SpO2:  [95 %-100 %] 97 % (01/10 0758)  Intake/Output:  Intake/Output Summary (Last 24 hours) at 08/25/2020 0857 Last data filed at 08/25/2020 0600 Gross per 24 hour  Intake 100 ml  Output 2675 ml  Net -2575 ml    Net IO Since Admission: -2,074.33 mL [08/25/20 0857]  Weights:  Filed Weights   08/22/20 2059  Weight: 89.3 kg    Telemetry: Sinus mechanism with PACs and PVCs, personally reviewed.  Physical examination: PHYSICAL EXAM: Vitals with BMI 08/25/2020 08/25/2020 08/24/2020  Height - - -  Weight - - -  BMI - - -  Systolic [phone]  Diastolic 63 60 60  Pulse 75 75 78    CONSTITUTIONAL: Appears older than stated age, hemodynamically stable, awake but unable to participate in a conversation . SKIN: Skin is warm and dry. No rash noted. No cyanosis. No pallor. No jaundice HEAD: Normocephalic and atraumatic.   EYES: Pupils are equally reactive to light, no scleral icterus MOUTH/THROAT: Grossly appears dry oral membranes.  NECK: No JVD present. No thyromegaly noted. No carotid bruits  LYMPHATIC: No visible cervical adenopathy.  CHEST equal rise and fall in chest cavity.  No intercostal retractions.  Central venous line noted.  Area is clean dry and  intact LUNGS: Laterally clear to auscultation.  No stridor. No wheezes. No rales.  CARDIOVASCULAR: Regular, positive M3-W4, soft holosystolic murmur heard at the apex, no gallops or rubs. ABDOMINAL: Soft, nontender, nondistended, no apparent ascites.  EXTREMITIES: No peripheral edema, no significant erythema noted at the bilateral shoulders. HEMATOLOGIC: No significant bruising NEUROLOGIC: Appears to be somnolent but awakens to verbal and noxious stimuli.    Lab Results: Hematology Recent Labs  Lab 08/22/20 2201 08/24/20 0052 08/25/20 0444  WBC 6.8 6.3 7.0  RBC 3.33* 3.65* 3.40*  HGB 9.7* 10.3* 9.7*  HCT 29.6* 32.5* 30.1*  MCV 88.9 89.0 88.5  MCH 29.1 28.2 28.5  MCHC 32.8 31.7 32.2  RDW 17.5* 17.2* 17.5*  PLT 242 255 270    Chemistry Recent Labs  Lab 08/22/20 2201 08/23/20 0323 08/24/20 0052 08/25/20 0444  NA 129* 130* 133* 142  K 5.3* 5.0 4.6 3.8  CL 101 103 105 108  CO2 14* 15* 13* 17*  GLUCOSE 87 104* 101* 108*  BUN 86* 86* 89* 94*  CREATININE 4.13* 4.15* 4.55* 5.17*  CALCIUM 9.4 9.1 8.8* 8.7*  PROT 7.3  --  6.9 6.6  ALBUMIN 2.6*  --  2.3* 2.2*  AST 17  --  15 19  ALT 16  --  13 13  ALKPHOS 106  --  100 83  BILITOT 0.7  --  0.7 1.0  GFRNONAA 14* 14* 13* 11*  ANIONGAP 14 12 15  17*     Cardiac Enzymes: Cardiac Panel (last 3 results) No results for input(s):  CKTOTAL, CKMB, TROPONINIHS, RELINDX in the last 72 hours.  BNP (last 3 results) Recent Labs    05/02/20 1620 07/11/20 0425 08/22/20 2201  BNP 626.2* 2,329.1* 1,652.4*    ProBNP (last 3 results) No results for input(s): PROBNP in the last 8760 hours.   DDimer No results for input(s): DDIMER in the last 168 hours.   Hemoglobin A1c:  Lab Results  Component Value Date   HGBA1C 6.5 (H) 08/22/2020   MPG 139.85 08/22/2020    TSH  Recent Labs    07/09/20 2356  TSH 0.996    Lipid Panel     Component Value Date/Time   CHOL 139 03/21/2018 0810   TRIG 71 03/21/2018 0810   HDL 37 (L)  03/21/2018 0810   CHOLHDL 3.8 03/21/2018 0810   VLDL 17.0 11/15/2014 1003   LDLCALC 86 03/21/2018 0810   LDLDIRECT 108.2 03/13/2010 1340    Imaging: CT HEAD WO CONTRAST  Result Date: 08/24/2020 CLINICAL DATA:  Obtunded, rule out stroke EXAM: CT HEAD WITHOUT CONTRAST TECHNIQUE: Contiguous axial images were obtained from the base of the skull through the vertex without intravenous contrast. COMPARISON:  12/31/2019 FINDINGS: Brain: No evidence of acute infarction, hemorrhage, hydrocephalus, extra-axial collection or mass lesion/mass effect. Chronic small vessel ischemia in the cerebral white matter. Small remote left cerebellar infarction. Brain atrophy. Vascular: No hyperdense vessel or unexpected calcification. Skull: Negative for fracture or focal lesion. Sinuses/Orbits: Postoperative paranasal sinuses, including frontal sinus obliteration. IMPRESSION: Senescent changes without acute or reversible finding. Electronically Signed   By: Monte Fantasia M.D.   On: 08/24/2020 11:26   US RENAL  Result Date: 08/23/2020 CLINICAL DATA:  Acute renal failure EXAM: RENAL / URINARY TRACT ULTRASOUND COMPLETE COMPARISON:  CT 09/21/2010 FINDINGS: Right Kidney: Renal measurements: 13.3 x 6.2 x 5.3 cm = volume: 223 mL. Increased echogenicity. No mass or hydronephrosis visualized. Septated cyst measuring 3 cm in size. Left Kidney: Renal measurements: 14.0 x 8.6 x 7.2 cm = volume: 454 mL. Increased echogenicity. No mass or hydronephrosis visualized. Nonobstructing 4 mm stone in the midportion. Bladder: Appears normal for degree of bladder distention. Other: None. IMPRESSION: Enlarged echogenic kidneys consistent with acute nephritis. No hydronephrosis. Electronically Signed   By: Nelson Chimes M.D.   On: 08/23/2020 12:19   DG CHEST PORT 1 VIEW  Result Date: 08/23/2020 CLINICAL DATA:  75 year old male with shortness of breath EXAM: PORTABLE CHEST 1 VIEW COMPARISON:  Chest radiograph dated 07/13/2020. FINDINGS: Central  venous line with tip at the cavoatrial junction. Cardiomegaly with mild vascular congestion. No focal consolidation, pleural effusion or pneumothorax. Median sternotomy wires. No acute osseous pathology. IMPRESSION: Cardiomegaly with mild vascular congestion. No focal consolidation. Electronically Signed   By: Anner Crete M.D.   On: 08/23/2020 21:57   DG Abd Portable 1V  Result Date: 08/23/2020 CLINICAL DATA:  MRI clearance EXAM: PORTABLE ABDOMEN - 1 VIEW COMPARISON:  None. FINDINGS: No metallic foreign body was identified in the abdomen or pelvis. Advanced vascular calcifications are noted. The bowel gas pattern is nonobstructive. There is a moderate amount of stool in the colon. Degenerative changes are noted throughout the lumbar spine and bilateral hips. IMPRESSION: 1. No metallic foreign body identified in the abdomen or pelvis. Advanced vascular calcifications are noted. 2. Nonobstructive bowel gas pattern. Moderate stool burden. Electronically Signed   By: Constance Holster M.D.   On: 08/23/2020 22:04    Cardiac database: Coronary artery bypass grafting2/2017:LIMA to the LAD, sequential SVG graft to diagonal OM, SVG to  PDA  EKG: 08/24/2020: Sinus tachycardia, 107 bpm normal axis, nonspecific ST-T abnormality, without underlying injury pattern.  Echocardiogram: 07/10/2020 1. Technically difficult study with limited views 2. Left ventricular ejection fraction, by estimation, is 50 to 55%. The left ventricle has low normal function. Left ventricular diastolic parameters were normal. 3. Right ventricular systolic function is normal. The right ventricular size is mildly enlarged. 4. Left atrial size was mildly dilated. 5. The mitral valve is normal in structure. Mild mitral valve regurgitation. 6. The aortic valve is tricuspid. Aortic valve regurgitation is not visualized. Mild to moderate aortic valve sclerosis/calcification is present, without any evidence of aortic  stenosis.  Scheduled Meds: . ARIPiprazole  5 mg Oral Daily  . aspirin EC  81 mg Oral Daily  . buPROPion  150 mg Oral Daily  . Chlorhexidine Gluconate Cloth  6 each Topical Daily  . ferrous sulfate  325 mg Oral Daily  . insulin aspart  0-9 Units Subcutaneous TID WC  . isosorbide mononitrate  60 mg Oral Daily  . metoprolol tartrate  5 mg Intravenous Q6H  . pantoprazole (PROTONIX) IV  40 mg Intravenous Q24H  . rOPINIRole  1 mg Oral BID  . senna  1 tablet Oral BID  . sodium bicarbonate  1,300 mg Oral TID  . tamsulosin  0.4 mg Oral BID  . ticagrelor  90 mg Oral BID    Continuous Infusions: . piperacillin-tazobactam (ZOSYN)  IV 2.25 g (08/25/20 0542)    PRN Meds: acetaminophen, albuterol, hydrALAZINE, nitroGLYCERIN, sodium chloride flush   IMPRESSION & RECOMMENDATIONS: [name] is a 75 y.o. male whose past medical history and cardiac risk factors include: Established CAD with prior CABGin 2017,PAD status post aortobifemoral bypassby Dr. Trula Slade in 2012,hypertension, hyperlipidemia, type 2 diabetes mellitus, history of right innominate artery high-grade stenosis, history of left CEAin 2012 due to retinal artery occlusion, history of non-STEMI (06/2020), hx of septic arthritis complicated by septic shock (06/2020), former smoker,advanced age.  Established coronary artery disease status post CABG: Continue guideline directed medical therapy.  Agree with holding Entresto and Lasix due to acute kidney injury.   Continue aspirin and Brilinta given his underlying CAD and recent non-STEMI. Continue antianginal therapy. Check EKG Currently on IV Lopressor scheduled. Once able to tolerate oral medications may consider restarting carvedilol at his home dose and using IV Lopressor on as needed basis.   Cardiology was consulted during his hospitalization for evaluation of atrial fibrillation.  Review of telemetry and EKGs thus far notes the underlying rhythm to be sinus.  Telemetry  reviewed. However, given his multiple risk factors, acute illness, acute kidney injury, and getting treated for septic arthritis he is predisposed to going into atrial fibrillation.  If he does develop Afib and there are no absolute contraindications to anticoagulation may start IV heparin per A. fib protocol dosed by pharmacy. For now we will follow patient peripherally, please do not hesitate to call if any questions or concerns arise during the hospitalization.  Acute encephalopathy, etiology unknown differential diagnosis includes but not limited to uremia due to acute kidney injury, recent history of septic arthritis. Will defer management to primary team and nephrology.  Acute kidney injury: Present on admission  Currently being followed by nephrology.   Monitor BUN and creatinine.   Avoid nephrotoxic agents. May hold Entresto and Lasix for now until kidney function improves.  Hypertension with chronic kidney disease: Blood pressure within acceptable range. Continue current home medications that are not nephrotoxic. May consider the addition of  oral hydralazine as the patient is already on Imdur. Currently managed by primary team.  Chronic HFpEF, stage C, NYHA class II: Overall euvolemic. Continue medical management  Hyperlipidemia, mixed:  Home medications include pravastatin and Praluent. Continue to monitor lipids, as outpatient.  Diabetes mellitus type 2 with complications of CKD, hyperlipidemia, PAD, CAD s/p CABG. Currently being managed by primary team  Continue care regarding his chronic comorbid conditions.  Once discharged, longitudinal follow-up care will be deferred to Dr. Laurance Flatten at [facility].  However, locally patient is more than welcome to follow-up with our practice as well.  Patient's questions and concerns were addressed to his satisfaction. He voices understanding of the instructions provided during this encounter.   This note  was created using a voice recognition software as a result there may be grammatical errors inadvertently enclosed that do not reflect the nature of this encounter. Every attempt is made to correct such errors.  Mechele Claude [facility]  Pager: [phone] Office: [phone] 08/25/2020, 8:57 AM

## 2020-08-25 NOTE — Progress Notes (Signed)
NAME:  [name], MRN:  [id number], DOB:  1946-01-06, LOS: 3 ADMISSION DATE:  08/22/2020, CONSULTATION DATE:  08/23/2020 REFERRING MD:  Dr. Doristine Bosworth , CHIEF COMPLAINT: Worsening AMS  Brief History:  75 year old male presented with acute renal failure.  PCCM consulted 1/9 for worsening altered mental status and concern for further decompensation.  History of Present Illness:  [name] is a 75 year old male with extensive past medical history including chronic systolic congestive heart failure, type 2 diabetes, neuropathy, PAD ASCVD diffuse, and recent osteoarthritis.  Of note patient was recently hospitalized at this facility 06/2420 -08/01/2020 for septic arthritis involving both shoulders and osteomyelitis of right humerus with GBS and Pseudomonas bacteremia resulting in severe septic shock.  A PICC line was placed and patient was prescribed 8 weeks of prolonged IV antibiotics with cefepime 2 mg 3 times daily.  Initially planned to be discontinued end of January 28th.  Patient presented to outpatient ID follow-up appointment 08/21/2020 with Dr. [name] at which time patient was seen with acute renal failure.  Patient was encouraged to present to the emergency department for further evaluation.  Patient was admitted by hospitalist services with nephrology, orthopedic and infectious disease consultations.  He continued to receive IV cefepime per infectious disease consultation.  Per chart review it appears patient became progressively lethargic evening of 1/8 with concerns of worsening sepsis. By morning of 11/9 patient is seen with progressively worsening altered mental status with concern for worsening decompensation.  Patient was transferred to progressive care.  After transfer to progressive care patient was seen with brief transition to atrial fibrillation with rapid ventricular response that was self-limiting.  Head CT currently pending therefore, PCCM was consulted for further systems and  management.  Past Medical History:  Chronic systolic congestive heart failure Type 2 diabetes Neuropathy PAD Osteoarthritis ASCVD diffuse  Significant Hospital Events:  Admitted 1/7  Consults:  Nephrology Infectious disease Orthopedic surgery  Procedures:    Significant Diagnostic Tests:  Renal ultrasound 1/8 > Enlarged echogenic kidneys consistent with acute nephritis. No Hydronephrosis.  Head CT 1/9 - IMPRESSION: Senescent changes without acute or reversible finding.  Micro Data:  COVID 1/7 > Negative  Urine culture 1/8 > MRSA PCR 1/8 > negative  Antimicrobials:  Cefepime 1/8 > 1/9 Zosyn 1/9 >  Interim History / Subjective:  Somnolent, arouses easily Not following commands  Objective   Blood pressure 97/73, pulse 86, temperature 98.4 F (36.9 C), temperature source Oral, resp. rate 17, height 6' (1.829 m), weight 89.3 kg, SpO2 95 %.        Intake/Output Summary (Last 24 hours) at 08/25/2020 1129 Last data filed at 08/25/2020 0600 Gross per 24 hour  Intake 100 ml  Output 1200 ml  Net -1100 ml   Filed Weights   08/22/20 2059  Weight: 89.3 kg    Examination: General: Chronically ill-appearing, encephalopathic HEENT: Dry oral mucosa Neuro: Opens eyes to name calling, not following commands CV: S1-S2 appreciated PULM: Clear breath sounds, appears able to protect airway GI: soft, bowel sounds active in all 4 quadrants, non-tender, non-distended  Resolved Hospital Problem list     Assessment & Plan:  Patient with multiple comorbidities  Septic arthritis History of group B strep bacteremia left shoulder septic arthritis -S/p arthroscopy with washout on 07/10/2020  History of Pseudomonas right shoulder septic arthritis, osteomyelitis of the humerus/AC, rotator cuff myositis -S/p arthroscopic left large and extensive debridement 07/17/2020  -MRI pending -No surgical interventions available at present per orthopedic surgery -Consider placing  drains per IR if MRI shows fluid collection -Continue antibiotics as per ID  Encephalopathy is multifactorial -Is able to protect his airway -Hold off on sedating medications -Cefepime discontinued  MRI brain MRI shoulder pending  Delirium precautions  Acute kidney injury Metabolic acidosis -Avoid nephrotoxic's -Ensure renal perfusion by maintaining blood pressure  History of hypertension History of chronic systolic congestive heart failure History of coronary artery disease Paroxysmal atrial fibrillation  Type 2 diabetes Generalized anxiety disorder  Patient with multiple comorbidities Risk of decompensation remains very high   Best practice (evaluated daily)  Diet: N.p.o. Pain/Anxiety/Delirium protocol (if indicated): As needed VAP protocol (if indicated): Not applicable DVT prophylaxis: Brilinta and aspirin GI prophylaxis: PPI Glucose control: SSI Mobility: Bedrest Disposition: Progressive care with low threshold to transfer to ICU  Goals of Care:  Last date of multidisciplinary goals of care discussion: Pending  Family and staff present: Pending Summary of discussion: Pending Follow up goals of care discussion due: Pending Code Status: ICU  Labs   CBC: Recent Labs  Lab 08/22/20 2201 08/24/20 0052 08/25/20 0444  WBC 6.8 6.3 7.0  NEUTROABS 4.3 4.4 4.6  HGB 9.7* 10.3* 9.7*  HCT 29.6* 32.5* 30.1*  MCV 88.9 89.0 88.5  PLT 242 255 035    Basic Metabolic Panel: Recent Labs  Lab 08/22/20 2201 08/23/20 0323 08/24/20 0052 08/25/20 0444  NA 129* 130* 133* 142  K 5.3* 5.0 4.6 3.8  CL 101 103 105 108  CO2 14* 15* 13* 17*  GLUCOSE 87 104* 101* 108*  BUN 86* 86* 89* 94*  CREATININE 4.13* 4.15* 4.55* 5.17*  CALCIUM 9.4 9.1 8.8* 8.7*  MG  --   --  2.5*  --    GFR: Estimated Creatinine Clearance: 13.8 mL/min (A) (by C-G formula based on SCr of 5.17 mg/dL (H)). Recent Labs  Lab 08/22/20 2201 08/23/20 2247 08/24/20 0052 08/25/20 0444  WBC 6.8  --   6.3 7.0  LATICACIDVEN  --  0.7 0.7  --     Liver Function Tests: Recent Labs  Lab 08/22/20 2201 08/24/20 0052 08/25/20 0444  AST 17 15 19   ALT 16 13 13   ALKPHOS 106 100 83  BILITOT 0.7 0.7 1.0  PROT 7.3 6.9 6.6  ALBUMIN 2.6* 2.3* 2.2*   No results for input(s): LIPASE, AMYLASE in the last 168 hours. No results for input(s): AMMONIA in the last 168 hours.  ABG    Component Value Date/Time   PHART 7.300 (L) 08/24/2020 1255   PCO2ART 30.3 (L) 08/24/2020 1255   PO2ART 90.8 08/24/2020 1255   HCO3 14.4 (L) 08/24/2020 1255   TCO2 22 05/07/2020 1444   ACIDBASEDEF 10.7 (H) 08/24/2020 1255   O2SAT 96.6 08/24/2020 1255     Coagulation Profile: No results for input(s): INR, PROTIME in the last 168 hours.  Cardiac Enzymes: No results for input(s): CKTOTAL, CKMB, CKMBINDEX, TROPONINI in the last 168 hours.  HbA1C: Hemoglobin A1C  Date/Time Value Ref Range Status  12/01/2017 12:00 AM 6.6  Final  07/28/2017 12:00 AM 6.5  Final   Hgb A1c MFr Bld  Date/Time Value Ref Range Status  08/22/2020 10:01 PM 6.5 (H) 4.8 - 5.6 % Final    Comment:    (NOTE) Pre diabetes:          5.7%-6.4%  Diabetes:              >6.4%  Glycemic control for   <7.0% adults with diabetes   05/12/2020 06:22 PM 7.2 (H) 4.8 -  5.6 % Final    Comment:    (NOTE) Pre diabetes:          5.7%-6.4%  Diabetes:              >6.4%  Glycemic control for   <7.0% adults with diabetes     CBG: Recent Labs  Lab 08/24/20 1126 08/24/20 1628 08/24/20 2050 08/25/20 0557 08/25/20 1104  GLUCAP 97 98 95 91 127*    Review of Systems:   Unable to assess given altered mental status  Past Medical History:  He,  has a past medical history of Acute renal failure (ARF) ([place]) (08/21/2020), Anemia, Anxiety, Blindness of left eye, BPH (benign prostatic hypertrophy), Broken foot (Oct. 12, 2013), CAD (coronary artery disease), Carotid stenosis, left, Cellulitis and abscess of leg (03/2018), Chronic hyponatremia,  Chronic pain syndrome, COPD (chronic obstructive pulmonary disease) ([place]), Depression, Diabetes mellitus, Diabetic foot ulcer ([place]), DJD (degenerative joint disease), DM2 (diabetes mellitus, type 2) ([place]), GERD (gastroesophageal reflux disease), HLD (hyperlipidemia), HTN (hypertension), blood transfusion reaction, Hyponatremia, Lumbar disc disease, MI (mitral incompetence), Myocardial infarction ([place]), Osteoporosis, Peripheral neuropathy, Peripheral vascular disease ([place]), Restless leg syndrome, Spine fracture, Varicose veins, Visual impairment of left eye, and Vitamin D deficiency.   Surgical History:   Past Surgical History:  Procedure Laterality Date  . ANGIOPLASTY    . aorta bifemoral bypass grafting  09/2010  . CARDIAC CATHETERIZATION N/A 09/19/2015   Procedure: Left Heart Cath and Coronary Angiography;  Surgeon: Adrian Prows, MD;  Location: [facility];  Service: Cardiovascular;  Laterality: N/A;  . CARPAL TUNNEL RELEASE     right 2006/ left 2007  . CATARACT EXTRACTION     left 1996/ right 1997  . CORONARY ARTERY BYPASS GRAFT N/A 09/22/2015   Procedure: CORONARY ARTERY BYPASS GRAFTING (CABG) times four using the right greater saphenous vein harvested endoscopically and the left internal mammary artery.  LIMA-LAD, SEQ SVG-DIAG & OM, SVG-PD.;  Surgeon: [name], MD;  Location: [facility];  Service: Open Heart Surgery;  Laterality: N/A;  . I & D EXTREMITY Right 04/07/2018   Procedure: IRRIGATION AND DEBRIDEMENT ABSCESS RIGHT LEG;  Surgeon: Newt Minion, MD;  Location: [facility];  Service: Orthopedics;  Laterality: Right;  . INCISION AND DRAINAGE OF WOUND Right 05/07/2020   Procedure: RIGHT LONG FINGER IRRIGATION AND DEBRIDEMENT AND AMPUTATION;  Surgeon: Iran Planas, MD;  Location: [facility];  Service: Orthopedics;  Laterality: Right;  with IV sedation  . INCISION AND DRAINAGE OF WOUND Right 05/12/2020   Procedure: IRRIGATION AND DEBRIDEMENT WOUND and possible revision amputation;  Surgeon:  Iran Planas, MD;  Location: [facility];  Service: Orthopedics;  Laterality: Right;  needs 60 minutes  . IR FLUORO GUIDE CV LINE RIGHT  07/18/2020  . IR US GUIDE VASC ACCESS RIGHT  07/18/2020  . L foot open repair jones fracture  2010    5th metetarsal   . left ankle ganglion cyst  1976  . left carotid endarterectomy  03/2011  . left cataract  1996   right - 1997  . left CTS  2006   R CTS - 2007  . left foot surgery  1998   R surgery/fracture - 1999  . left plantar ganglion cystectomy  1979  . left wrist/hand fusion  2008  . [place], 2004  . NASAL SINUS SURGERY     multiple- x8. last 1997 with obliteration  . REPAIR THORACIC AORTA  2017  . right hand fracture  [place]  2006   R, than repeat 2011, Dr Theda Sers  . SHOULDER ARTHROSCOPY Bilateral 07/17/2020   Procedure: Right shoulder arthroscopic lavage; Left shoulder aspiration.;  Surgeon: Justice Britain, MD;  Location: [facility];  Service: Orthopedics;  Laterality: Bilateral;  147min  . SHOULDER ARTHROSCOPY WITH LABRAL REPAIR Left 07/10/2020   Procedure: SHOULDER ARTHROSCOPY WITH WASHOUT;  Surgeon: Justice Britain, MD;  Location: [facility];  Service: Orthopedics;  Laterality: Left;  . TRIGGER FINGER RELEASE Left 11/11/2014   Procedure: LEFT LONG FINGER RELEASE TRIGGER FINGER/A-1 PULLEY;  Surgeon: Milly Jakob, MD;  Location: [facility];  Service: Orthopedics;  Laterality: Left;  . vocal surgery  1996     Social History:   reports that he quit smoking about 31 years ago. His smoking use included cigarettes. He has a 45.00 pack-year smoking history. He has never used smokeless tobacco. He reports previous alcohol use. He reports that he does not use drugs.   Family History:  His family history includes Bleeding Disorder in his mother; Brain cancer (age of onset: 68) in his paternal grandfather; Depression in his mother; Diabetes in his sister; Diabetes (age of onset: 13) in his maternal grandfather;  Diabetes (age of onset: 38) in his mother; Heart attack in his paternal grandmother; Heart disease in his maternal grandfather; Lung cancer (age of onset: 9) in his maternal grandmother; Lung cancer (age of onset: 27) in his father; Osteoporosis in his mother; Suicidality (age of onset: 42) in his son; Throat cancer in an other family member.   Allergies Allergies  Allergen Reactions  . Ivp Dye [Iodinated Diagnostic Agents]     Blood Pressure dropped----pt was pre-medicated with 13 hour prep and did fine with pre-meds--amy 03/09/11   . Sulfa Antibiotics Hives  . Sulfonamide Derivatives Hives and Itching  . Lisinopril Cough  . Metoprolol Nausea And Vomiting  . Statins Other (See Comments)    myalgias  . Propoxyphene N-Acetaminophen Other (See Comments)    Sharp pains- headache    Sherrilyn Rist, MD [facility] Pager: [phone]

## 2020-08-25 NOTE — Progress Notes (Signed)
Pharmacy Antibiotic Note  [name] is a 75 y.o. male admitted on 08/22/2020 with Bilateral septic arthritis with Group B streptococcus and Pseudomonas. Pt being followed by ID as outpt and on Cefepime which was continued inpatient (planned 8 wks course with stop date of 1/28). No debridement planned by ortho, planning to initiate iHD today.  Plan: Adjust Zosyn to 2.25g IV q8h   Height: 6' (182.9 cm) Weight: 89.3 kg (196 lb 13.9 oz) IBW/kg (Calculated) : 77.6  Temp (24hrs), Avg:98.5 F (36.9 C), Min:98.2 F (36.8 C), Max:99 F (37.2 C)  Recent Labs  Lab 08/22/20 2201 08/23/20 0323 08/23/20 2247 08/24/20 0052 08/25/20 0444  WBC 6.8  --   --  6.3 7.0  CREATININE 4.13* 4.15*  --  4.55* 5.17*  LATICACIDVEN  --   --  0.7 0.7  --     Estimated Creatinine Clearance: 13.8 mL/min (A) (by C-G formula based on SCr of 5.17 mg/dL (H)).    Allergies  Allergen Reactions  . Ivp Dye [Iodinated Diagnostic Agents]     Blood Pressure dropped----pt was pre-medicated with 13 hour prep and did fine with pre-meds--amy 03/09/11   . Sulfa Antibiotics Hives  . Sulfonamide Derivatives Hives and Itching  . Lisinopril Cough  . Metoprolol Nausea And Vomiting  . Statins Other (See Comments)    myalgias  . Propoxyphene N-Acetaminophen Other (See Comments)    Sharp pains- headache    Antimicrobials this admission: Cefepime 12/17 (Started last admission) >> 1/9 Zosyn 1/9 >>   Arrie Senate, PharmD, BCPS, [facility] Clinical Pharmacist [phone] Please check AMION for all [facility] numbers 08/25/2020

## 2020-08-25 NOTE — Progress Notes (Signed)
Transported to IR by bed for HD cath insertion.

## 2020-08-25 NOTE — Progress Notes (Signed)
PROGRESS NOTE    [name]  IYM:[id number] DOB: 1945/11/01 DOA: 08/22/2020 PCP: Doreatha Lew, MD   Brief Narrative:  HPI: [name] is a 75 y.o. male with medical history significant of multiple medical problems including chronic systolic heart failure, DM with completication of neuropathy, PAD, ASVD diffuse. He has a h/o chronic shoulder problems. He was admitted 07/09/20-08/01/20 for septic arthritis both shoulders with GBS and Pseudomonas with septic shock. He was also found to have osteomyelitis right humerus. A PICC line was placed and the patient was discharged on 8 weeks of cefipeme 2g TID to end January 28th.  He was seen by Dr. [name] for ID follow-up 08/21/20. At that encounter Dr. [name] recommended the patient be admitted for continue care, management of renal failure and continued orthopedic consultation. It took until late on the day of admission, 08/22/20, for a bed to be available.   Patient reports he has been able to eat and drink. He has been taking his medications. He reports that both shoulders cause him a lot of pain.     ED Course: Patient not seen in ED - direct admit.  Assessment & Plan:   Principal Problem:   Septic arthritis of shoulder ([place]) Active Problems:   Hyperlipidemia   Essential hypertension   BPH (benign prostatic hyperplasia)   Hyponatremia   Generalized anxiety disorder   Restless leg syndrome   Chronic systolic congestive heart failure, NYHA class 3 (HCC)   Hyperlipidemia associated with type 2 diabetes mellitus (HCC)   Diabetes mellitus type II, non insulin dependent (HCC)   Acute renal failure (ARF) (HCC)   Osteomyelitis of right humerus (HCC)   Encephalopathy acute   1. Septic arthritis - bilateral shoulder/osteomyelitis humerus. He has had multiple procedures and aspirations. MRI bilateral shoulder ordered and could not be completed yesterday due to patient's encephalopathy and inability to follow commands.  Seen by  orthopedics, they opined that there is no surgical need at this point in time.  ID is on board.  Antibiotics switched from cefepime to Zosyn.  2. Acute renal failure: Patient's renal function was completely normal just 3 weeks ago.  Comes in with a creatinine of 4.13 which is worsening slowly.  Could be multifactorial such as secondary to sepsis, AIN.  Nephrology on board.  Plan for temporary catheter and dialysis today.  Continue to hold Lasix and Entresto.  3. Chronic hyponatremia: Improving.  4. HTN-controlled.  Continue home medications.  However due to obtundation, he is unable to take any p.o. medications and since his blood pressure is within normal range so I will continue him on as needed IV hydralazine.  5.  Diabetes mellitus type 2: Blood sugar fairly controlled.  Continue SSI.  6. Chronic systolic heart failure - last Echo 07/10/20  EF 50-55%: Appears euvolemic.  Hold Lasix and Entresto.  7. HLD - continue home meds.  8. GAD - patient with complex psych history including anxiety, PTSD, paranois, depression.  Resume home medications once able to take p.o.  9. Restless leg syndrome - continue home meds.   10. BPH -continue home medications when able to take p.o.  11.  Acute uremic/toxic/multifactorial encephalopathy: Patient is more awake compared to yesterday however he is still not following commands.  He is tracking today.  Plan for dialysis per nephrology today.  CT head negative for any acute pathology.  12.  History of CAD s/p CABG and now with new onset atrial fibrillation: Early this morning, patient  developed new onset atrial fibrillation but his rates were around 110.  At this point in time he is in sinus rhythm with rates around 85.  I had consulted cardiology this morning.  Certainly his A. fib could put him at risk of stroke as well.  If CT head negative for any intracranial bleed and if he develops atrial fibrillation again, he would be a candidate for  anticoagulation.  However I will defer this to cardiology.  13.  Metabolic acidosis: Patient has metabolic acidosis for which he has been on bicarb.  Will defer to nephrology.  DVT prophylaxis:    Code Status: Full Code unfortunately, patient's next of kin/son is unreachable to make any decisions about CODE STATUS and patient's only contact who is his friend does not have legal authority to do so.  In the meantime, patient remains full code by default. Family Communication: Discussed with patient's friend yesterday.  We will update her later today.  Status is: Inpatient  Remains inpatient appropriate because:Inpatient level of care appropriate due to severity of illness   Dispo: The patient is from: Home              Anticipated d/c is to: Home              Anticipated d/c date is: > 3 days              Patient currently is not medically stable to d/c.        Estimated body mass index is 26.7 kg/m as calculated from the following:   Height as of this encounter: 6' (1.829 m).   Weight as of this encounter: 89.3 kg.      Nutritional status:               Consultants:   ID  Orthopedics  Nephrology  PCCM  Cardiology  Procedures:   None  Antimicrobials:  Anti-infectives (From admission, onward)   Start     Dose/Rate Route Frequency Ordered Stop   08/24/20 1800  piperacillin-tazobactam (ZOSYN) IVPB 2.25 g        2.25 g 100 mL/hr over 30 Minutes Intravenous Every 6 hours 08/24/20 1256     08/24/20 1015  piperacillin-tazobactam (ZOSYN) IVPB 3.375 g  Status:  Discontinued        3.375 g 12.5 mL/hr over 240 Minutes Intravenous Every 8 hours 08/24/20 0925 08/24/20 1256   08/23/20 1000  ceFEPIme (MAXIPIME) 2 g in sodium chloride 0.9 % 100 mL IVPB  Status:  Discontinued        2 g 200 mL/hr over 30 Minutes Intravenous Every 24 hours 08/22/20 2355 08/24/20 0848   08/22/20 2245  ceFEPime (MAXIPIME) IVPB  Status:  Discontinued       Note to Pharmacy:  Indication:  Septic arthritis/osteomyelitis  First Dose: No Last Day of Therapy:  09/12/2020 Labs - Once weekly:  CBC/D and BMP, Labs - Every other week:  ESR and CRP Method of administration: IV PushPharmacy to consult re: dose adjust with renal failure   2 g Intravenous Every 8 hours 08/22/20 2155 08/22/20 2354         Subjective: Seen and examined.  Patient more alert but still not following commands.  Seems to be confused.  Objective: Vitals:   08/24/20 2300 08/25/20 0300 08/25/20 0758 08/25/20 1102  BP: (!) 156/60 (!) 153/60 (!) 146/63 97/73  Pulse: 78 75 75 86  Resp: 16 16 17    Temp: 99 F (37.2 C) 98.4  F (36.9 C) 98.6 F (37 C) 98.4 F (36.9 C)  TempSrc: Axillary Axillary Axillary Oral  SpO2: 95% 96% 97% 95%  Weight:      Height:        Intake/Output Summary (Last 24 hours) at 08/25/2020 1113 Last data filed at 08/25/2020 0600 Gross per 24 hour  Intake 100 ml  Output 1200 ml  Net -1100 ml   Filed Weights   08/22/20 2059  Weight: 89.3 kg    Examination:  General exam: Alert but confused, not following commands Respiratory system: Diminished breath sounds with bilateral crackles in the bases. Respiratory effort normal. Cardiovascular system: S1 & S2 heard, RRR. No JVD, murmurs, rubs, gallops or clicks. No pedal edema. Gastrointestinal system: Abdomen is nondistended, soft and nontender. No organomegaly or masses felt. Normal bowel sounds heard. Central nervous system: Alert but confused.  Not following commands.   Data Reviewed: I have personally reviewed following labs and imaging studies  CBC: Recent Labs  Lab 08/22/20 2201 08/24/20 0052 08/25/20 0444  WBC 6.8 6.3 7.0  NEUTROABS 4.3 4.4 4.6  HGB 9.7* 10.3* 9.7*  HCT 29.6* 32.5* 30.1*  MCV 88.9 89.0 88.5  PLT 242 255 163   Basic Metabolic Panel: Recent Labs  Lab 08/22/20 2201 08/23/20 0323 08/24/20 0052 08/25/20 0444  NA 129* 130* 133* 142  K 5.3* 5.0 4.6 3.8  CL 101 103 105 108  CO2  14* 15* 13* 17*  GLUCOSE 87 104* 101* 108*  BUN 86* 86* 89* 94*  CREATININE 4.13* 4.15* 4.55* 5.17*  CALCIUM 9.4 9.1 8.8* 8.7*  MG  --   --  2.5*  --    GFR: Estimated Creatinine Clearance: 13.8 mL/min (A) (by C-G formula based on SCr of 5.17 mg/dL (H)). Liver Function Tests: Recent Labs  Lab 08/22/20 2201 08/24/20 0052 08/25/20 0444  AST 17 15 19   ALT 16 13 13   ALKPHOS 106 100 83  BILITOT 0.7 0.7 1.0  PROT 7.3 6.9 6.6  ALBUMIN 2.6* 2.3* 2.2*   No results for input(s): LIPASE, AMYLASE in the last 168 hours. No results for input(s): AMMONIA in the last 168 hours. Coagulation Profile: No results for input(s): INR, PROTIME in the last 168 hours. Cardiac Enzymes: No results for input(s): CKTOTAL, CKMB, CKMBINDEX, TROPONINI in the last 168 hours. BNP (last 3 results) No results for input(s): PROBNP in the last 8760 hours. HbA1C: Recent Labs    08/22/20 2201  HGBA1C 6.5*   CBG: Recent Labs  Lab 08/24/20 1126 08/24/20 1628 08/24/20 2050 08/25/20 0557 08/25/20 1104  GLUCAP 97 98 95 91 127*   Lipid Profile: No results for input(s): CHOL, HDL, LDLCALC, TRIG, CHOLHDL, LDLDIRECT in the last 72 hours. Thyroid Function Tests: No results for input(s): TSH, T4TOTAL, FREET4, T3FREE, THYROIDAB in the last 72 hours. Anemia Panel: No results for input(s): VITAMINB12, FOLATE, FERRITIN, TIBC, IRON, RETICCTPCT in the last 72 hours. Sepsis Labs: Recent Labs  Lab 08/23/20 2247 08/24/20 0052  LATICACIDVEN 0.7 0.7    Recent Results (from the past 240 hour(s))  SARS CORONAVIRUS 2 (TAT 6-24 HRS) Nasopharyngeal Nasopharyngeal Swab     Status: None   Collection Time: 08/22/20 11:57 PM   Specimen: Nasopharyngeal Swab  Result Value Ref Range Status   SARS Coronavirus 2 NEGATIVE NEGATIVE Final    Comment: (NOTE) SARS-CoV-2 target nucleic acids are NOT DETECTED.  The SARS-CoV-2 RNA is generally detectable in upper and lower respiratory specimens during the acute phase of infection.  Negative results do  not preclude SARS-CoV-2 infection, do not rule out co-infections with other pathogens, and should not be used as the sole basis for treatment or other patient management decisions. Negative results must be combined with clinical observations, patient history, and epidemiological information. The expected result is Negative.  Fact Sheet for Patients: SugarRoll.be  Fact Sheet for Healthcare Providers: https://www.woods-mathews.com/  This test is not yet approved or cleared by the [place] FDA and  has been authorized for detection and/or diagnosis of SARS-CoV-2 by FDA under an Emergency Use Authorization (EUA). This EUA will remain  in effect (meaning this test can be used) for the duration of the COVID-19 declaration under Se ction 564(b)(1) of the Act, 21 U.S.C. section 360bbb-3(b)(1), unless the authorization is terminated or revoked sooner.  Performed at [facility], [place] [address], [place], [place] [address]   MRSA PCR Screening     Status: None   Collection Time: 08/23/20  9:30 PM   Specimen: Nasal Mucosa; Nasopharyngeal  Result Value Ref Range Status   MRSA by PCR NEGATIVE NEGATIVE Final    Comment:        The GeneXpert MRSA Assay (FDA approved for NASAL specimens only), is one component of a comprehensive MRSA colonization surveillance program. It is not intended to diagnose MRSA infection nor to guide or monitor treatment for MRSA infections. Performed at [facility], [place] [address] [place], [place] [address]       Radiology Studies: CT HEAD WO CONTRAST  Result Date: 08/24/2020 CLINICAL DATA:  Obtunded, rule out stroke EXAM: CT HEAD WITHOUT CONTRAST TECHNIQUE: Contiguous axial images were obtained from the base of the skull through the vertex without intravenous contrast. COMPARISON:  12/31/2019 FINDINGS: Brain: No evidence of acute infarction, hemorrhage, hydrocephalus, extra-axial  collection or mass lesion/mass effect. Chronic small vessel ischemia in the cerebral white matter. Small remote left cerebellar infarction. Brain atrophy. Vascular: No hyperdense vessel or unexpected calcification. Skull: Negative for fracture or focal lesion. Sinuses/Orbits: Postoperative paranasal sinuses, including frontal sinus obliteration. IMPRESSION: Senescent changes without acute or reversible finding. Electronically Signed   By: Monte Fantasia M.D.   On: 08/24/2020 11:26   US RENAL  Result Date: 08/23/2020 CLINICAL DATA:  Acute renal failure EXAM: RENAL / URINARY TRACT ULTRASOUND COMPLETE COMPARISON:  CT 09/21/2010 FINDINGS: Right Kidney: Renal measurements: 13.3 x 6.2 x 5.3 cm = volume: 223 mL. Increased echogenicity. No mass or hydronephrosis visualized. Septated cyst measuring 3 cm in size. Left Kidney: Renal measurements: 14.0 x 8.6 x 7.2 cm = volume: 454 mL. Increased echogenicity. No mass or hydronephrosis visualized. Nonobstructing 4 mm stone in the midportion. Bladder: Appears normal for degree of bladder distention. Other: None. IMPRESSION: Enlarged echogenic kidneys consistent with acute nephritis. No hydronephrosis. Electronically Signed   By: Nelson Chimes M.D.   On: 08/23/2020 12:19   DG CHEST PORT 1 VIEW  Result Date: 08/23/2020 CLINICAL DATA:  75 year old male with shortness of breath EXAM: PORTABLE CHEST 1 VIEW COMPARISON:  Chest radiograph dated 07/13/2020. FINDINGS: Central venous line with tip at the cavoatrial junction. Cardiomegaly with mild vascular congestion. No focal consolidation, pleural effusion or pneumothorax. Median sternotomy wires. No acute osseous pathology. IMPRESSION: Cardiomegaly with mild vascular congestion. No focal consolidation. Electronically Signed   By: Anner Crete M.D.   On: 08/23/2020 21:57   DG Abd Portable 1V  Result Date: 08/23/2020 CLINICAL DATA:  MRI clearance EXAM: PORTABLE ABDOMEN - 1 VIEW COMPARISON:  None. FINDINGS: No metallic foreign  body was  identified in the abdomen or pelvis. Advanced vascular calcifications are noted. The bowel gas pattern is nonobstructive. There is a moderate amount of stool in the colon. Degenerative changes are noted throughout the lumbar spine and bilateral hips. IMPRESSION: 1. No metallic foreign body identified in the abdomen or pelvis. Advanced vascular calcifications are noted. 2. Nonobstructive bowel gas pattern. Moderate stool burden. Electronically Signed   By: Constance Holster M.D.   On: 08/23/2020 22:04    Scheduled Meds: . ARIPiprazole  5 mg Oral Daily  . aspirin EC  81 mg Oral Daily  . buPROPion  150 mg Oral Daily  . Chlorhexidine Gluconate Cloth  6 each Topical Daily  . ferrous sulfate  325 mg Oral Daily  . insulin aspart  0-9 Units Subcutaneous TID WC  . isosorbide mononitrate  60 mg Oral Daily  . metoprolol tartrate  5 mg Intravenous Q6H  . pantoprazole (PROTONIX) IV  40 mg Intravenous Q24H  . rOPINIRole  1 mg Oral BID  . senna  1 tablet Oral BID  . sodium bicarbonate  1,300 mg Oral TID  . tamsulosin  0.4 mg Oral BID  . ticagrelor  90 mg Oral BID   Continuous Infusions: . piperacillin-tazobactam (ZOSYN)  IV 2.25 g (08/25/20 0542)     LOS: 3 days   Time spent: 30-minute   Darliss Cheney, MD [facility]  08/25/2020, 11:13 AM   To contact the attending provider between 7A-7P or the covering provider during after hours 7P-7A, please log into the web site [email or web address].

## 2020-08-26 ENCOUNTER — Inpatient Hospital Stay (HOSPITAL_COMMUNITY): Payer: Medicare Other

## 2020-08-26 DIAGNOSIS — M00812 Arthritis due to other bacteria, left shoulder: Secondary | ICD-10-CM | POA: Diagnosis not present

## 2020-08-26 DIAGNOSIS — M00811 Arthritis due to other bacteria, right shoulder: Secondary | ICD-10-CM | POA: Diagnosis not present

## 2020-08-26 DIAGNOSIS — M00819 Arthritis due to other bacteria, unspecified shoulder: Secondary | ICD-10-CM | POA: Diagnosis not present

## 2020-08-26 LAB — BLOOD GAS, ARTERIAL
Acid-base deficit: 11.1 mmol/L — ABNORMAL HIGH (ref 0.0–2.0)
Bicarbonate: 14.3 mmol/L — ABNORMAL LOW (ref 20.0–28.0)
FIO2: 100
O2 Saturation: 99 %
Patient temperature: 36.9
pCO2 arterial: 30.8 mmHg — ABNORMAL LOW (ref 32.0–48.0)
pH, Arterial: 7.287 — ABNORMAL LOW (ref 7.350–7.450)
pO2, Arterial: 169 mmHg — ABNORMAL HIGH (ref 83.0–108.0)

## 2020-08-26 LAB — GLUCOSE, CAPILLARY
Glucose-Capillary: 114 mg/dL — ABNORMAL HIGH (ref 70–99)
Glucose-Capillary: 128 mg/dL — ABNORMAL HIGH (ref 70–99)
Glucose-Capillary: 122 mg/dL — ABNORMAL HIGH (ref 70–99)
Glucose-Capillary: 120 mg/dL — ABNORMAL HIGH (ref 70–99)
Glucose-Capillary: 120 mg/dL — ABNORMAL HIGH (ref 70–99)

## 2020-08-26 LAB — CBC WITH DIFFERENTIAL/PLATELET
Abs Immature Granulocytes: 0.04 10*3/uL (ref 0.00–0.07)
Basophils Absolute: 0.1 10*3/uL (ref 0.0–0.1)
Basophils Relative: 1 %
Eosinophils Absolute: 0.6 10*3/uL — ABNORMAL HIGH (ref 0.0–0.5)
Eosinophils Relative: 7 %
HCT: 32.1 % — ABNORMAL LOW (ref 39.0–52.0)
Hemoglobin: 10.3 g/dL — ABNORMAL LOW (ref 13.0–17.0)
Immature Granulocytes: 1 %
Lymphocytes Relative: 11 %
Lymphs Abs: 1 10*3/uL (ref 0.7–4.0)
MCH: 28.7 pg (ref 26.0–34.0)
MCHC: 32.1 g/dL (ref 30.0–36.0)
MCV: 89.4 fL (ref 80.0–100.0)
Monocytes Absolute: 1.4 10*3/uL — ABNORMAL HIGH (ref 0.1–1.0)
Monocytes Relative: 16 %
Neutro Abs: 5.7 10*3/uL (ref 1.7–7.7)
Neutrophils Relative %: 64 %
Platelets: 289 10*3/uL (ref 150–400)
RBC: 3.59 MIL/uL — ABNORMAL LOW (ref 4.22–5.81)
RDW: 18.1 % — ABNORMAL HIGH (ref 11.5–15.5)
WBC: 8.8 10*3/uL (ref 4.0–10.5)
nRBC: 0 % (ref 0.0–0.2)

## 2020-08-26 LAB — BASIC METABOLIC PANEL
Anion gap: 21 — ABNORMAL HIGH (ref 5–15)
BUN: 95 mg/dL — ABNORMAL HIGH (ref 8–23)
CO2: 15 mmol/L — ABNORMAL LOW (ref 22–32)
Calcium: 8.8 mg/dL — ABNORMAL LOW (ref 8.9–10.3)
Chloride: 110 mmol/L (ref 98–111)
Creatinine, Ser: 5.3 mg/dL — ABNORMAL HIGH (ref 0.61–1.24)
GFR, Estimated: 11 mL/min — ABNORMAL LOW (ref >60)
Glucose, Bld: 136 mg/dL — ABNORMAL HIGH (ref 70–99)
Potassium: 3.7 mmol/L (ref 3.5–5.1)
Sodium: 146 mmol/L — ABNORMAL HIGH (ref 135–145)

## 2020-08-26 LAB — HEPATITIS B CORE ANTIBODY, TOTAL: Hep B Core Total Ab: NONREACTIVE

## 2020-08-26 LAB — HEPATITIS B SURFACE ANTIGEN: Hepatitis B Surface Ag: NONREACTIVE

## 2020-08-26 MED ORDER — HEPARIN SODIUM (PORCINE) 1000 UNIT/ML IJ SOLN
INTRAMUSCULAR | Status: AC
  Administered 2020-08-26: 2800 [IU]
  Filled 2020-08-26: qty 4

## 2020-08-26 MED ORDER — HEPARIN SODIUM (PORCINE) 5000 UNIT/ML IJ SOLN
5000.0000 [IU] | Freq: Three times a day (TID) | INTRAMUSCULAR | Status: DC
  Administered 2020-08-26 – 2020-08-27 (×5): 5000 [IU] via SUBCUTANEOUS
  Filled 2020-08-26 (×5): qty 1

## 2020-08-26 NOTE — Progress Notes (Signed)
This patient arrived alert butr lethargic and non verbal. Arrived on a non mask with 2L of O2 connected. Oxygen saturation in the mid 90's on arrival. Pt arrived with audible gurgling sounds.PVc's on monitor. Dr. Candiss Norse, Harl Favor was called and made aware of patient's baseline on arrival. [facility] to initiate HD per MD. BP and hr were stable on arrival

## 2020-08-26 NOTE — Progress Notes (Signed)
Nephrology Follow-Up Consult note   Assessment/Recommendations: [name] is a/an 75 y.o. male with a past medical history significant for HFrEF, DM 2, neuropathy, PAD, CAD, chronic osteomyelitis who presents with AKI and AMS  AKI, worsening; non-oliguric: Unclear cause at this time.  Creatinine only slightly worse at 5.2 today.  Renal ultrasound without hydronephrosis but enlarged kidneys unclear cause.  Most likely NSAID use and Entresto contributed to AKI.  Possible intermittent hypotension. AIN in differential -given persistent AMS (which may very well be from uremia), will start hemodialysis, s/p RIJ temp line via IR on 1/10 (2 physician consent). Slow start protocol w/ HD. HD planned for 1/11, 1/12, 1/13. Monitoring for recovery -Dose medications appropriately as below -Continue to monitor daily Cr, Dose meds for GFR -Monitor Daily I/Os, Daily weight  -Maintain MAP>65 for optimal renal perfusion.  -Avoid nephrotoxic medications including NSAIDs and Vanc/Zosyn combo  Altered mental status: Altered on my exam but protecting his airway.    Likely multifactorial: uremia with gabapentin and cefepime contributing -Hold further gabapentin and benzodiazepine -cefepime switched to zosyn -starting HD  Hypernatremia -was hyponatremic (likely related to free water retention from AKI), now likely related to dehydration. Encourage PO hydration, HD planned as above. [id number] bath  Metabolic acidosis: Likely associated with AKI.    Bicarb 13 today.  2amps of bicarb every 6 hours x 2 and oral bicarbonate 1300 mg 3 times daily.  Continue to monitor. Can likely stop this once steady on HD  Osteomyelitis: of shoulders.    Switched to zosyn.  ID and Ortho following  CHF/HTN: Reduced ejection fraction.  Can continue Imdur and carvedilol.  Hold Entresto for now.    Stopping fluids today due to crackles on exam.  Room air   Recommendations conveyed to primary service.    [facility] 08/26/2020 11:48 AM  ___________________________________________________________  CC: AKI  Interval History/Subjective:  Seen early in AM, was comfortable. Mentation seemed to be slightly better. S/p temp line w/ IR yday. RR called later this AM for respiratory distress. CXR neg for any acute disease. Open for HD today   Medications:  Current Facility-Administered Medications  Medication Dose Route Frequency Provider Last Rate Last Admin  . 0.9 %  sodium chloride infusion   Intravenous PRN Darliss Cheney, MD 10 mL/hr at 08/26/20 0631 Infusion Verify at 08/26/20 0631  . acetaminophen (TYLENOL) tablet 1,000 mg  1,000 mg Oral Q6H PRN Neena Rhymes, MD   1,000 mg at 08/23/20 0931  . albuterol (VENTOLIN HFA) 108 (90 Base) MCG/ACT inhaler 2 puff  2 puff Inhalation Q6H PRN Norins, Heinz Knuckles, MD      . ARIPiprazole (ABILIFY) tablet 5 mg  5 mg Oral Daily Norins, Heinz Knuckles, MD      . aspirin EC tablet 81 mg  81 mg Oral Daily Pahwani, Ravi, MD      . buPROPion (WELLBUTRIN XL) 24 hr tablet 150 mg  150 mg Oral Daily Pahwani, Ravi, MD      . chlorhexidine (PERIDEX) 0.12 % solution 15 mL  15 mL Mouth Rinse BID Pahwani, Einar Grad, MD      . Chlorhexidine Gluconate Cloth 2 % PADS 6 each  6 each Topical Daily Darliss Cheney, MD   6 each at 08/26/20 1053  . ferrous sulfate tablet 325 mg  325 mg Oral Daily Norins, Heinz Knuckles, MD      . heparin injection 5,000 Units  5,000 Units Subcutaneous Q8H Einar Grad, RPH      .  hydrALAZINE (APRESOLINE) injection 10 mg  10 mg Intravenous Q6H PRN Pahwani, Einar Grad, MD      . insulin aspart (novoLOG) injection 0-9 Units  0-9 Units Subcutaneous TID WC Darliss Cheney, MD   1 Units at 08/25/20 1122  . isosorbide mononitrate (IMDUR) 24 hr tablet 60 mg  60 mg Oral Daily Norins, Heinz Knuckles, MD      . lidocaine (XYLOCAINE) 1 % (with pres) injection    PRN Markus Daft, MD   10 mL at 08/25/20 1632  . MEDLINE mouth rinse  15 mL Mouth Rinse q12n4p Pahwani, Ravi, MD       . metoprolol tartrate (LOPRESSOR) injection 5 mg  5 mg Intravenous Q6H Pahwani, Ravi, MD   5 mg at 08/26/20 0514  . nitroGLYCERIN (NITROSTAT) SL tablet 0.4 mg  0.4 mg Sublingual Q5 min PRN Norins, Heinz Knuckles, MD      . pantoprazole (PROTONIX) injection 40 mg  40 mg Intravenous Q24H Darliss Cheney, MD   40 mg at 08/25/20 1122  . piperacillin-tazobactam (ZOSYN) IVPB 2.25 g  2.25 g Intravenous Q8H Darliss Cheney, MD   Stopped at 08/26/20 0544  . rOPINIRole (REQUIP) tablet 1 mg  1 mg Oral BID Norins, Heinz Knuckles, MD   1 mg at 08/22/20 2314  . senna (SENOKOT) tablet 8.6 mg  1 tablet Oral BID Darliss Cheney, MD   8.6 mg at 08/22/20 2314  . sodium bicarbonate tablet 1,300 mg  1,300 mg Oral TID Darliss Cheney, MD      . sodium chloride flush (NS) 0.9 % injection 10-40 mL  10-40 mL Intracatheter PRN Darliss Cheney, MD      . tamsulosin (FLOMAX) capsule 0.4 mg  0.4 mg Oral BID Norins, Heinz Knuckles, MD   0.4 mg at 08/22/20 2314  . ticagrelor (BRILINTA) tablet 90 mg  90 mg Oral BID Neena Rhymes, MD   90 mg at 08/22/20 2315      Review of Systems: Unable to assess due to the patient's AMS  Physical Exam: Vitals:   08/26/20 1050 08/26/20 1130  BP:    Pulse: 98 100  Resp: 20 (!) 22  Temp:    SpO2: 100% 100%   No intake/output data recorded.  Intake/Output Summary (Last 24 hours) at 08/26/2020 1148 Last data filed at 08/26/2020 0800 Gross per 24 hour  Intake 119.88 ml  Output 1450 ml  Net -1330.12 ml   Constitutional: Ill-appearing, lying in bed, not interactive ENMT: ears and nose without scars or lesions, dry mucosal membranes CV: Tachycardic, no edema bilateral lower extremities Respiratory: Crackles in the bilateral bases, normal work of breathing Gastrointestinal: soft, non-tender, no palpable masses or hernias Skin: no visible lesions or rashes Psych: Altered, unable to answer questions appropriately, not following commands, intermittently tracking   Test Results I personally reviewed  new and old clinical labs and radiology tests Lab Results  Component Value Date   NA 146 (H) 08/26/2020   K 3.7 08/26/2020   CL 110 08/26/2020   CO2 15 (L) 08/26/2020   BUN 95 (H) 08/26/2020   CREATININE 5.30 (H) 08/26/2020   GFR 118.86 03/19/2015   GLU 116 12/01/2017   CALCIUM 8.8 (L) 08/26/2020   ALBUMIN 2.2 (L) 08/25/2020   PHOS 3.2 07/18/2020

## 2020-08-26 NOTE — Progress Notes (Signed)
Patient's oxygen demands increased as evidenced by drop in Oxygen saturation from 98% on 2 L to 88%, Oxygen level was increased after paging Dr. Candiss Norse with very little improvement. Patient increased to 90% on 5L. Rapid response nurse was called to bedside. Wilburn Cornelia, RN arrived as RRT nurse. Non rebreather was applied and deep suction was provided by the RRT nurse and a Respiratory therapist with good results. See vital signs. Oxygen saturation is now at 100% on non rebreather. Pt looks comfortable.

## 2020-08-26 NOTE — Progress Notes (Signed)
[facility]   Reason for visit: Follow up on osteomyelitis  Interval History: now with a TDC in place, on a facemask for O2.  WBC wnl. Afebrile.  Alert but does not respond.    Physical Exam: Constitutional:  Vitals:   08/26/20 1130 08/26/20 1148  BP:    Pulse: 100 100  Resp: (!) 22 19  Temp:    SpO2: 100% 100%   patient appears in NAD Respiratory: Normal respiratory effort; CTA B; on facemask Cardiovascular: Tachy, RR Neuro: alert but does not respond to questions  Review of Systems: Unable to be assessed due to mental status  Lab Results  Component Value Date   WBC 8.8 08/26/2020   HGB 10.3 (L) 08/26/2020   HCT 32.1 (L) 08/26/2020   MCV 89.4 08/26/2020   PLT 289 08/26/2020    Lab Results  Component Value Date   CREATININE 5.30 (H) 08/26/2020   BUN 95 (H) 08/26/2020   NA 146 (H) 08/26/2020   K 3.7 08/26/2020   CL 110 08/26/2020   CO2 15 (L) 08/26/2020    Lab Results  Component Value Date   ALT 13 08/25/2020   AST 19 08/25/2020   ALKPHOS 83 08/25/2020     Microbiology: Recent Results (from the past 240 hour(s))  SARS CORONAVIRUS 2 (TAT 6-24 HRS) Nasopharyngeal Nasopharyngeal Swab     Status: None   Collection Time: 08/22/20 11:57 PM   Specimen: Nasopharyngeal Swab  Result Value Ref Range Status   SARS Coronavirus 2 NEGATIVE NEGATIVE Final    Comment: (NOTE) SARS-CoV-2 target nucleic acids are NOT DETECTED.  The SARS-CoV-2 RNA is generally detectable in upper and lower respiratory specimens during the acute phase of infection. Negative results do not preclude SARS-CoV-2 infection, do not rule out co-infections with other pathogens, and should not be used as the sole basis for treatment or other patient management decisions. Negative results must be combined with clinical observations, patient history, and epidemiological information. The expected result is Negative.  Fact Sheet for  Patients: SugarRoll.be  Fact Sheet for Healthcare Providers: https://www.woods-mathews.com/  This test is not yet approved or cleared by the [place] FDA and  has been authorized for detection and/or diagnosis of SARS-CoV-2 by FDA under an Emergency Use Authorization (EUA). This EUA will remain  in effect (meaning this test can be used) for the duration of the COVID-19 declaration under Se ction 564(b)(1) of the Act, 21 U.S.C. section 360bbb-3(b)(1), unless the authorization is terminated or revoked sooner.  Performed at [facility], [place] [address] [place], [place] [address]   MRSA PCR Screening     Status: None   Collection Time: 08/23/20  9:30 PM   Specimen: Nasal Mucosa; Nasopharyngeal  Result Value Ref Range Status   MRSA by PCR NEGATIVE NEGATIVE Final    Comment:        The GeneXpert MRSA Assay (FDA approved for NASAL specimens only), is one component of a comprehensive MRSA colonization surveillance program. It is not intended to diagnose MRSA infection nor to guide or monitor treatment for MRSA infections. Performed at [facility], [place] [address] [place], [place] [address]     Impression/Plan:  1.  Right shoulder septic arthritis - no plans for further debridement.  Continue with current antibiotics.    2.   Left shoulder septic arthritis - on treatment along with #1 and will continue with antibiotics.     3.  New renal failure -  likely contributing to his AMS and plan for hemodialysis today.    4.  Uremia - BUN remains elevated at 95.  Getting dialysis as above.

## 2020-08-26 NOTE — Progress Notes (Signed)
PROGRESS NOTE    [name]  DEY:[id number] DOB: 06-16-1946 DOA: 08/22/2020 PCP: Doreatha Lew, MD   Brief Narrative:  HPI: [name] is a 75 y.o. male with medical history significant of multiple medical problems including chronic systolic heart failure, DM with completication of neuropathy, PAD, ASVD diffuse. He has a h/o chronic shoulder problems. He was admitted 07/09/20-08/01/20 for septic arthritis both shoulders with GBS and Pseudomonas with septic shock. He was also found to have osteomyelitis right humerus. A PICC line was placed and the patient was discharged on 8 weeks of cefipeme 2g TID to end January 28th.  He was seen by Dr. [name] for ID follow-up 08/21/20. At that encounter Dr. [name] recommended the patient be admitted for continue care, management of renal failure and continued orthopedic consultation. It took until late on the day of admission, 08/22/20, for a bed to be available.   Patient reports he has been able to eat and drink. He has been taking his medications. He reports that both shoulders cause him a lot of pain.     ED Course: Patient not seen in ED - direct admit.  Assessment & Plan:   Principal Problem:   Septic arthritis of shoulder ([place]) Active Problems:   Hyperlipidemia   Essential hypertension   BPH (benign prostatic hyperplasia)   Hyponatremia   Generalized anxiety disorder   Restless leg syndrome   Chronic systolic congestive heart failure, NYHA class 3 (HCC)   Hyperlipidemia associated with type 2 diabetes mellitus (HCC)   Diabetes mellitus type II, non insulin dependent (HCC)   AKI (acute kidney injury) ([place])   Osteomyelitis of right humerus (HCC)   Encephalopathy acute   1. Septic arthritis - bilateral shoulder/osteomyelitis humerus. He has had multiple procedures and aspirations. MRI bilateral shoulder ordered and could not be completed yesterday due to patient's encephalopathy and inability to follow commands.  Seen by  orthopedics, they opined that there is no surgical need at this point in time.  ID is on board.  Antibiotics switched from cefepime to Zosyn.  2. Acute renal failure: Patient's renal function was completely normal just 3 weeks ago.  Comes in with a creatinine of 4.13 which is worsening slowly and this morning's labs are pending.  Could be multifactorial such as secondary to sepsis, AIN.  Nephrology on board.  Underwent temporary catheter placement on 06/25/2021 and plan as to do for session of hemodialysis today.  Continue to hold Entresto and Lasix.  3. Chronic hyponatremia: Improving.  4. HTN-controlled.  Continue home medications.  However due to obtundation, he is unable to take any p.o. medications and since his blood pressure is within normal range so I will continue him on as needed IV hydralazine.  5.  Diabetes mellitus type 2: Blood sugar fairly controlled.  Continue SSI.  6. Chronic systolic heart failure - last Echo 07/10/20  EF 50-55%: Appears euvolemic.  Hold Lasix and Entresto.  7. HLD - continue home meds.  8. GAD - patient with complex psych history including anxiety, PTSD, paranois, depression.  Resume home medications once able to take p.o.  9. Restless leg syndrome - continue home meds.   10. BPH -continue home medications when able to take p.o.  11.  Acute uremic/toxic/multifactorial encephalopathy: CT head negative.  Patient remains awake, tracks with his head movement as well but does not follow commands.  12.  History of CAD s/p CABG and now with new onset atrial fibrillation: Early morning of 08/24/2020,  there was some concern that patient had developed brief episode of atrial fibrillation.  Cardiology was consulted.  Per cardiology, he did not have atrial fibrillation.  He has remained in sinus rhythm since then.  13.  Metabolic acidosis: Patient received bicarb.  Will defer to nephrology.  14. acute respiratory failure with hypoxia: This morning when I saw  patient, he was comfortable without hypoxia however he had significant crackles bilaterally.  An hour later, patient desaturated to below 80% and required nonrebreather.  RRT was called.  Patient was downgraded to 6 L high flow nasal cannula.  Chest x-ray was repeated which I personally reviewed and I believe they are worsening vascular congestions however official radiology report states that the chest x-ray is normal.  I believe patient is fluid overloaded.  I have ordered ABG which is pending as well.  I believe patient will benefit from removal of the fluid via hemodialysis.  Removal of the fluid via hemodialysis. Nephrology and PCCM already on board.  Patient's condition remains tenuous.   DVT prophylaxis: Consult pharmacy to start on heparin.   Code Status: Full Code unfortunately, patient's next of kin/son is unreachable to make any decisions about CODE STATUS and patient's only contact who is his friend does not have legal authority to do so.  In the meantime, patient remains full code by default.  Per patient's friend, patient's son is estranged and is never reachable. Family Communication: Discussed with patient's friend yesterday.  We will update her later today.  Status is: Inpatient  Remains inpatient appropriate because:Inpatient level of care appropriate due to severity of illness   Dispo: The patient is from: Home              Anticipated d/c is to: Home              Anticipated d/c date is: > 3 days              Patient currently is not medically stable to d/c.        Estimated body mass index is 26.7 kg/m as calculated from the following:   Height as of this encounter: 6' (1.829 m).   Weight as of this encounter: 89.3 kg.      Nutritional status:               Consultants:   ID  Orthopedics  Nephrology  PCCM  Cardiology  Procedures:   None  Antimicrobials:  Anti-infectives (From admission, onward)   Start     Dose/Rate Route Frequency  Ordered Stop   08/25/20 2200  piperacillin-tazobactam (ZOSYN) IVPB 2.25 g        2.25 g 100 mL/hr over 30 Minutes Intravenous Every 8 hours 08/25/20 1230     08/24/20 1800  piperacillin-tazobactam (ZOSYN) IVPB 2.25 g  Status:  Discontinued        2.25 g 100 mL/hr over 30 Minutes Intravenous Every 6 hours 08/24/20 1256 08/25/20 1230   08/24/20 1015  piperacillin-tazobactam (ZOSYN) IVPB 3.375 g  Status:  Discontinued        3.375 g 12.5 mL/hr over 240 Minutes Intravenous Every 8 hours 08/24/20 0925 08/24/20 1256   08/23/20 1000  ceFEPIme (MAXIPIME) 2 g in sodium chloride 0.9 % 100 mL IVPB  Status:  Discontinued        2 g 200 mL/hr over 30 Minutes Intravenous Every 24 hours 08/22/20 2355 08/24/20 0848   08/22/20 2245  ceFEPime (MAXIPIME) IVPB  Status:  Discontinued  Note to Pharmacy: Indication:  Septic arthritis/osteomyelitis  First Dose: No Last Day of Therapy:  09/12/2020 Labs - Once weekly:  CBC/D and BMP, Labs - Every other week:  ESR and CRP Method of administration: IV PushPharmacy to consult re: dose adjust with renal failure   2 g Intravenous Every 8 hours 08/22/20 2155 08/22/20 2354         Subjective: Patient seen and examined this morning.  At that point in time, he was alert and comfortable.  An hour later, he developed respiratory distress.  Rapid response was called.  Objective: Vitals:   08/26/20 0800 08/26/20 0925 08/26/20 0945 08/26/20 0950  BP: (!) 158/71 (!) 155/79    Pulse: 99 99 100 100  Resp: 19 20 20 18   Temp: 98.3 F (36.8 C)     TempSrc: Oral     SpO2: 92% (!) 85% 97% 97%  Weight:      Height:        Intake/Output Summary (Last 24 hours) at 08/26/2020 1028 Last data filed at 08/26/2020 0800 Gross per 24 hour  Intake 119.88 ml  Output 1450 ml  Net -1330.12 ml   Filed Weights   08/22/20 2059  Weight: 89.3 kg    Examination:  General exam: Appears appeared calm and comfortable initially but in respiratory distress later on. Respiratory  system: Crackles bilaterally in the middle and lower lobes. Respiratory effort normal. Cardiovascular system: S1 & S2 heard, RRR. No JVD, murmurs, rubs, gallops or clicks. No pedal edema. Gastrointestinal system: Abdomen is nondistended, soft and nontender. No organomegaly or masses felt. Normal bowel sounds heard. Central nervous system: Alert and awake but not following any commands.  Data Reviewed: I have personally reviewed following labs and imaging studies  CBC: Recent Labs  Lab 08/22/20 2201 08/24/20 0052 08/25/20 0444 08/26/20 0409  WBC 6.8 6.3 7.0 8.8  NEUTROABS 4.3 4.4 4.6 5.7  HGB 9.7* 10.3* 9.7* 10.3*  HCT 29.6* 32.5* 30.1* 32.1*  MCV 88.9 89.0 88.5 89.4  PLT 242 255 270 606   Basic Metabolic Panel: Recent Labs  Lab 08/22/20 2201 08/23/20 0323 08/24/20 0052 08/25/20 0444  NA 129* 130* 133* 142  K 5.3* 5.0 4.6 3.8  CL 101 103 105 108  CO2 14* 15* 13* 17*  GLUCOSE 87 104* 101* 108*  BUN 86* 86* 89* 94*  CREATININE 4.13* 4.15* 4.55* 5.17*  CALCIUM 9.4 9.1 8.8* 8.7*  MG  --   --  2.5*  --    GFR: Estimated Creatinine Clearance: 13.8 mL/min (A) (by C-G formula based on SCr of 5.17 mg/dL (H)). Liver Function Tests: Recent Labs  Lab 08/22/20 2201 08/24/20 0052 08/25/20 0444  AST 17 15 19   ALT 16 13 13   ALKPHOS 106 100 83  BILITOT 0.7 0.7 1.0  PROT 7.3 6.9 6.6  ALBUMIN 2.6* 2.3* 2.2*   No results for input(s): LIPASE, AMYLASE in the last 168 hours. No results for input(s): AMMONIA in the last 168 hours. Coagulation Profile: No results for input(s): INR, PROTIME in the last 168 hours. Cardiac Enzymes: No results for input(s): CKTOTAL, CKMB, CKMBINDEX, TROPONINI in the last 168 hours. BNP (last 3 results) No results for input(s): PROBNP in the last 8760 hours. HbA1C: No results for input(s): HGBA1C in the last 72 hours. CBG: Recent Labs  Lab 08/25/20 1104 08/25/20 1708 08/25/20 2121 08/26/20 0614 08/26/20 0933  GLUCAP 127* 101* 113* 114* 128*    Lipid Profile: No results for input(s): CHOL, HDL, LDLCALC, TRIG,  CHOLHDL, LDLDIRECT in the last 72 hours. Thyroid Function Tests: No results for input(s): TSH, T4TOTAL, FREET4, T3FREE, THYROIDAB in the last 72 hours. Anemia Panel: No results for input(s): VITAMINB12, FOLATE, FERRITIN, TIBC, IRON, RETICCTPCT in the last 72 hours. Sepsis Labs: Recent Labs  Lab 08/23/20 2247 08/24/20 0052  LATICACIDVEN 0.7 0.7    Recent Results (from the past 240 hour(s))  SARS CORONAVIRUS 2 (TAT 6-24 HRS) Nasopharyngeal Nasopharyngeal Swab     Status: None   Collection Time: 08/22/20 11:57 PM   Specimen: Nasopharyngeal Swab  Result Value Ref Range Status   SARS Coronavirus 2 NEGATIVE NEGATIVE Final    Comment: (NOTE) SARS-CoV-2 target nucleic acids are NOT DETECTED.  The SARS-CoV-2 RNA is generally detectable in upper and lower respiratory specimens during the acute phase of infection. Negative results do not preclude SARS-CoV-2 infection, do not rule out co-infections with other pathogens, and should not be used as the sole basis for treatment or other patient management decisions. Negative results must be combined with clinical observations, patient history, and epidemiological information. The expected result is Negative.  Fact Sheet for Patients: SugarRoll.be  Fact Sheet for Healthcare Providers: https://www.woods-mathews.com/  This test is not yet approved or cleared by the [place] FDA and  has been authorized for detection and/or diagnosis of SARS-CoV-2 by FDA under an Emergency Use Authorization (EUA). This EUA will remain  in effect (meaning this test can be used) for the duration of the COVID-19 declaration under Se ction 564(b)(1) of the Act, 21 U.S.C. section 360bbb-3(b)(1), unless the authorization is terminated or revoked sooner.  Performed at [facility], [place] [address], [place], [place] [address]   MRSA PCR  Screening     Status: None   Collection Time: 08/23/20  9:30 PM   Specimen: Nasal Mucosa; Nasopharyngeal  Result Value Ref Range Status   MRSA by PCR NEGATIVE NEGATIVE Final    Comment:        The GeneXpert MRSA Assay (FDA approved for NASAL specimens only), is one component of a comprehensive MRSA colonization surveillance program. It is not intended to diagnose MRSA infection nor to guide or monitor treatment for MRSA infections. Performed at [facility], [place] [address] [place], [place] [address]       Radiology Studies: CT HEAD WO CONTRAST  Result Date: 08/24/2020 CLINICAL DATA:  Obtunded, rule out stroke EXAM: CT HEAD WITHOUT CONTRAST TECHNIQUE: Contiguous axial images were obtained from the base of the skull through the vertex without intravenous contrast. COMPARISON:  12/31/2019 FINDINGS: Brain: No evidence of acute infarction, hemorrhage, hydrocephalus, extra-axial collection or mass lesion/mass effect. Chronic small vessel ischemia in the cerebral white matter. Small remote left cerebellar infarction. Brain atrophy. Vascular: No hyperdense vessel or unexpected calcification. Skull: Negative for fracture or focal lesion. Sinuses/Orbits: Postoperative paranasal sinuses, including frontal sinus obliteration. IMPRESSION: Senescent changes without acute or reversible finding. Electronically Signed   By: Monte Fantasia M.D.   On: 08/24/2020 11:26   IR Fluoro Guide CV Line Left  Result Date: 08/25/2020 INDICATION: 35 year old with acute kidney injury and needs hemodialysis catheter. EXAM: FLUOROSCOPIC AND ULTRASOUND GUIDED PLACEMENT OF A NON-TUNNELED DIALYSIS CATHETER Physician: Stephan Minister. Henn, MD MEDICATIONS: None ANESTHESIA/SEDATION: None FLUOROSCOPY TIME:  Fluoroscopy Time: 30 seconds, 2 mGy COMPLICATIONS: None immediate. PROCEDURE: Informed consent could not be obtained because of the patient's altered mental status. Emergency consent was obtained. The patient was placed supine  on the interventional table. Ultrasound confirmed a patent left internal vein.  Ultrasound images were obtained for documentation. The left neck was prepped and draped in a sterile fashion. The left neck was anesthetized with 1% lidocaine. Maximal barrier sterile technique was utilized including caps, mask, sterile gowns, sterile gloves, sterile drape, hand hygiene and skin antiseptic. A small incision was made with #11 blade scalpel. A 21 gauge needle directed into the left internal jugular vein with ultrasound guidance. A micropuncture dilator set was placed. A 20 cm Mahurkar catheter was selected. The catheter was advanced over a wire and positioned in the superior vena cava. Fluoroscopic images were obtained for documentation. Both dialysis lumens were found to aspirate and flush well. The proper amount of heparin was flushed in both lumens. The central venous lumen was flushed with normal saline. Catheter was sutured to skin. FINDINGS: Dialysis catheter tip in the superior vena cava. Existing tunneled right jugular central venous catheter with tip at the superior cavoatrial junction. IMPRESSION: Successful placement of a left jugular non-tunneled dialysis catheter using ultrasound and fluoroscopic guidance. Electronically Signed   By: Markus Daft M.D.   On: 08/25/2020 17:56   IR US Guide Vasc Access Left  Result Date: 08/25/2020 INDICATION: 31 year old with acute kidney injury and needs hemodialysis catheter. EXAM: FLUOROSCOPIC AND ULTRASOUND GUIDED PLACEMENT OF A NON-TUNNELED DIALYSIS CATHETER Physician: Stephan Minister. Henn, MD MEDICATIONS: None ANESTHESIA/SEDATION: None FLUOROSCOPY TIME:  Fluoroscopy Time: 30 seconds, 2 mGy COMPLICATIONS: None immediate. PROCEDURE: Informed consent could not be obtained because of the patient's altered mental status. Emergency consent was obtained. The patient was placed supine on the interventional table. Ultrasound confirmed a patent left internal vein. Ultrasound images were  obtained for documentation. The left neck was prepped and draped in a sterile fashion. The left neck was anesthetized with 1% lidocaine. Maximal barrier sterile technique was utilized including caps, mask, sterile gowns, sterile gloves, sterile drape, hand hygiene and skin antiseptic. A small incision was made with #11 blade scalpel. A 21 gauge needle directed into the left internal jugular vein with ultrasound guidance. A micropuncture dilator set was placed. A 20 cm Mahurkar catheter was selected. The catheter was advanced over a wire and positioned in the superior vena cava. Fluoroscopic images were obtained for documentation. Both dialysis lumens were found to aspirate and flush well. The proper amount of heparin was flushed in both lumens. The central venous lumen was flushed with normal saline. Catheter was sutured to skin. FINDINGS: Dialysis catheter tip in the superior vena cava. Existing tunneled right jugular central venous catheter with tip at the superior cavoatrial junction. IMPRESSION: Successful placement of a left jugular non-tunneled dialysis catheter using ultrasound and fluoroscopic guidance. Electronically Signed   By: Markus Daft M.D.   On: 08/25/2020 17:56   DG CHEST PORT 1 VIEW  Result Date: 08/26/2020 CLINICAL DATA:  Hypoxia. EXAM: PORTABLE CHEST 1 VIEW COMPARISON:  Single-view of the chest 08/23/2020. FINDINGS: New tunnel dialysis catheter with its tip in the superior vena cava noted. Right IJ catheter is unchanged. Lung volumes are low with mild basilar atelectasis. Heart size is mildly enlarged. The patient is status post CABG. No pneumothorax or pleural effusion. IMPRESSION: No acute disease. Electronically Signed   By: Inge Rise M.D.   On: 08/26/2020 10:06    Scheduled Meds: . ARIPiprazole  5 mg Oral Daily  . aspirin EC  81 mg Oral Daily  . buPROPion  150 mg Oral Daily  . chlorhexidine  15 mL Mouth Rinse BID  . Chlorhexidine Gluconate Cloth  6 each Topical Daily  .  ferrous sulfate  325 mg Oral Daily  . heparin injection (subcutaneous)  5,000 Units Subcutaneous Q8H  . insulin aspart  0-9 Units Subcutaneous TID WC  . isosorbide mononitrate  60 mg Oral Daily  . mouth rinse  15 mL Mouth Rinse q12n4p  . metoprolol tartrate  5 mg Intravenous Q6H  . pantoprazole (PROTONIX) IV  40 mg Intravenous Q24H  . rOPINIRole  1 mg Oral BID  . senna  1 tablet Oral BID  . sodium bicarbonate  1,300 mg Oral TID  . tamsulosin  0.4 mg Oral BID  . ticagrelor  90 mg Oral BID   Continuous Infusions: . sodium chloride 10 mL/hr at 08/26/20 0631  . piperacillin-tazobactam (ZOSYN)  IV Stopped (08/26/20 0544)     LOS: 4 days   Time spent: 37 minutes   Darliss Cheney, MD [facility]  08/26/2020, 10:28 AM   To contact the attending provider between 7A-7P or the covering provider during after hours 7P-7A, please log into the web site [email or web address].

## 2020-08-26 NOTE — Significant Event (Addendum)
Rapid Response Event Note   Reason for Call :  Acute oxygen desaturation while on room air. Now on 100% NRB with oxygne saturation of 88%.   Initial Focused Assessment:  Pt lying in bed. Alert, no speech. Lung sounds are clear, diminished. Breathing is heavy, mild accessory muscle use. Pt is scheduled for HD today. Pt does not appear to be in distress. He is looking around at staff and appears to be attempting to engage in the setting of having speech deficits. Skin is pink, hot, dry. Rectal temperature checked, 98.38F. No peripheral edema noted.   Pt weaned from 100% NRB to 8L HFNC, oxygen saturation 98%. Pt smiling and looking around room. Remains mildly tachypneic. Pt sustained oxygen saturation >92% on 6LNC for 10-15 minutes before acute oxygen desaturation to 84%, RN placed pt back on 100% NRB.   VS: T 98.38F, BP 155/79, HR 98, RR 20, SpO2 85% on 100% NRB  Interventions:  -CXR  Plan of Care:  -Wean supplemental oxygen as pt tolerates -HOB >30 degrees, oral care, oral suction set-up, pulmonary hygiene -Dialysis today  Call rapid response for additional needs.  Event Summary:  MD Notified: Dr. Doristine Bosworth, notified by primary RN Call Time: [phone] Arrival Time: 0930 End Time: [place], RN

## 2020-08-26 NOTE — Progress Notes (Signed)
Called the floor to get report on the patient.  Per RN they had to call Rapid Response on the patient twice today.  Pt. Is also not holding still or following commands at this time.  Unable to preform MRI today.

## 2020-08-26 NOTE — Progress Notes (Signed)
ANTICOAGULATION CONSULT NOTE - Initial Consult  Pharmacy Consult for heparin Indication: VTE ppx  Allergies  Allergen Reactions  . Ivp Dye [Iodinated Diagnostic Agents]     Blood Pressure dropped----pt was pre-medicated with 13 hour prep and did fine with pre-meds--amy 03/09/11   . Sulfa Antibiotics Hives  . Sulfonamide Derivatives Hives and Itching  . Lisinopril Cough  . Metoprolol Nausea And Vomiting  . Statins Other (See Comments)    myalgias  . Propoxyphene N-Acetaminophen Other (See Comments)    Sharp pains- headache    Patient Measurements: Height: 6' (182.9 cm) Weight: 89.3 kg (196 lb 13.9 oz) IBW/kg (Calculated) : 77.6  Vital Signs: Temp: 98.3 F (36.8 C) (01/11 0800) Temp Source: Oral (01/11 0800) BP: 155/79 (01/11 0925) Pulse Rate: 100 (01/11 0950)  Labs: Recent Labs    08/24/20 0052 08/25/20 0444 08/26/20 0409  HGB 10.3* 9.7* 10.3*  HCT 32.5* 30.1* 32.1*  PLT 255 270 289  CREATININE 4.55* 5.17*  --     Estimated Creatinine Clearance: 13.8 mL/min (A) (by C-G formula based on SCr of 5.17 mg/dL (H)).   Medical History: Past Medical History:  Diagnosis Date  . Acute renal failure (ARF) ([place]) 08/21/2020  . Anemia    NOS  . Anxiety   . 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 BPH (benign prostatic hypertrophy)   . Broken foot Oct. 12, 2013   Right foot Fx  . CAD (coronary artery disease)   . Carotid stenosis, left   . Cellulitis and abscess of leg 03/2018   right leg  . Chronic hyponatremia   . Chronic pain syndrome   . COPD (chronic obstructive pulmonary disease) ([place])   . Depression   . Diabetes mellitus   . Diabetic foot ulcer ([place])   . DJD (degenerative joint disease)    L wrist  . DM2 (diabetes mellitus, type 2) ([place])   . GERD (gastroesophageal reflux disease)   . HLD (hyperlipidemia)   . HTN (hypertension)   . Hx of blood transfusion reaction   . Hyponatremia   . Lumbar disc disease   . MI (mitral incompetence)    . Myocardial infarction ([place])    x 2  . Osteoporosis    tx per [facility], Dr Prudencio Burly, thought due to heavy steriod use after 1978  . Peripheral neuropathy   . Peripheral vascular disease ([place])   . Restless leg syndrome   . Spine fracture    hx, multiple  . Varicose veins   . 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 Vitamin D deficiency      Assessment: 59 yoM with renal failure and encephalopathy. Pharmacy asked to dose heparin for VTE ppx.    Plan:  Heparin 5000 units SQ TID Pharmacy to sign off, reconsult if needed  Arrie Senate, PharmD, BCPS, [facility] Clinical Pharmacist [phone] Please check AMION for all [place] numbers 08/26/2020

## 2020-08-26 NOTE — Significant Event (Addendum)
Rapid Response Event Note   Reason for Call :  SpO2 90% on 100% NRB. Audible upper airway secretions.  Initial Focused Assessment:  Pt in bed, HOB at 60 degrees. Audible upper airway secretions. Attempted getting pt to cough to help clear secretions, unsuccessful. Nasotracheal suctioning with return of copious, clear, thin secretions. Pt oxygen saturation improved from 90% to 100%, pt remained on 100% NRB. Lung sounds are rhonchus in the right upper and lower lobe. Diminished, rhonchus on the left side.   VS: BP 149/77, HR 108, RR 19, SpO2 100% on 100% NRB  Interventions:  -NTS -100% NRB- pt has 15-20 minutes left on his dialysis treatment, wait to wean oxygen once treatment is complete  Plan of Care:  -PRN NTS (nasotracheal suctioning) -Oral care per protocol -Wean oxygen as pt tolerates  Event Summary:  MD Notified: Dr. Doristine Bosworth  Call Time: [id number] Arrival Time: 1440 End Time: [place], RN

## 2020-08-27 DIAGNOSIS — M00811 Arthritis due to other bacteria, right shoulder: Secondary | ICD-10-CM | POA: Diagnosis not present

## 2020-08-27 DIAGNOSIS — N179 Acute kidney failure, unspecified: Secondary | ICD-10-CM | POA: Diagnosis not present

## 2020-08-27 DIAGNOSIS — R4182 Altered mental status, unspecified: Secondary | ICD-10-CM | POA: Diagnosis not present

## 2020-08-27 DIAGNOSIS — M00819 Arthritis due to other bacteria, unspecified shoulder: Secondary | ICD-10-CM | POA: Diagnosis not present

## 2020-08-27 LAB — CBC WITH DIFFERENTIAL/PLATELET
Abs Immature Granulocytes: 0.03 10*3/uL (ref 0.00–0.07)
Basophils Absolute: 0.1 10*3/uL (ref 0.0–0.1)
Basophils Relative: 1 %
Eosinophils Absolute: 0.4 10*3/uL (ref 0.0–0.5)
Eosinophils Relative: 5 %
HCT: 32.4 % — ABNORMAL LOW (ref 39.0–52.0)
Hemoglobin: 10.3 g/dL — ABNORMAL LOW (ref 13.0–17.0)
Immature Granulocytes: 0 %
Lymphocytes Relative: 10 %
Lymphs Abs: 0.8 10*3/uL (ref 0.7–4.0)
MCH: 28.5 pg (ref 26.0–34.0)
MCHC: 31.8 g/dL (ref 30.0–36.0)
MCV: 89.8 fL (ref 80.0–100.0)
Monocytes Absolute: 1.4 10*3/uL — ABNORMAL HIGH (ref 0.1–1.0)
Monocytes Relative: 18 %
Neutro Abs: 5.2 10*3/uL (ref 1.7–7.7)
Neutrophils Relative %: 66 %
Platelets: 268 10*3/uL (ref 150–400)
RBC: 3.61 MIL/uL — ABNORMAL LOW (ref 4.22–5.81)
RDW: 18.4 % — ABNORMAL HIGH (ref 11.5–15.5)
WBC: 8 10*3/uL (ref 4.0–10.5)
nRBC: 0 % (ref 0.0–0.2)

## 2020-08-27 LAB — BASIC METABOLIC PANEL
Anion gap: 18 — ABNORMAL HIGH (ref 5–15)
BUN: 82 mg/dL — ABNORMAL HIGH (ref 8–23)
CO2: 19 mmol/L — ABNORMAL LOW (ref 22–32)
Calcium: 8.7 mg/dL — ABNORMAL LOW (ref 8.9–10.3)
Chloride: 112 mmol/L — ABNORMAL HIGH (ref 98–111)
Creatinine, Ser: 4.68 mg/dL — ABNORMAL HIGH (ref 0.61–1.24)
GFR, Estimated: 12 mL/min — ABNORMAL LOW (ref >60)
Glucose, Bld: 124 mg/dL — ABNORMAL HIGH (ref 70–99)
Potassium: 3.5 mmol/L (ref 3.5–5.1)
Sodium: 149 mmol/L — ABNORMAL HIGH (ref 135–145)

## 2020-08-27 LAB — GLUCOSE, CAPILLARY
Glucose-Capillary: 117 mg/dL — ABNORMAL HIGH (ref 70–99)
Glucose-Capillary: 113 mg/dL — ABNORMAL HIGH (ref 70–99)
Glucose-Capillary: 109 mg/dL — ABNORMAL HIGH (ref 70–99)
Glucose-Capillary: 111 mg/dL — ABNORMAL HIGH (ref 70–99)

## 2020-08-27 LAB — HEPATITIS B SURFACE ANTIBODY, QUANTITATIVE: Hep B S AB Quant (Post): <3.1 m[IU]/mL — ABNORMAL LOW (ref >9.9)

## 2020-08-27 NOTE — Progress Notes (Signed)
Nephrology Follow-Up Consult note   Assessment/Recommendations: [name] is a/an 75 y.o. male with a past medical history significant for HFrEF, DM 2, neuropathy, PAD, CAD, chronic osteomyelitis who presents with AKI and AMS  AKI, worsening; non-oliguric: Unclear cause at this time. Renal ultrasound without hydronephrosis but enlarged kidneys unclear cause.  Most likely NSAID use and Entresto contributed to AKI.  Possible intermittent hypotension. AIN in differential -given persistent AMS (which may very well be from uremia), started IHD on 1/11 (slow start protocol). Given that his mentation significantly improved, has great urine output with normal baseline kidney function, will hold on his #2 treatment today and monitor and hope that his renal function improves on it's own. Will have a low threshold for a #2 treatment tomorrow depending on his mentation -Dose medications appropriately as below -Continue to monitor daily Cr, Dose meds for GFR -Monitor Daily I/Os, Daily weight  -Maintain MAP>65 for optimal renal perfusion.  -Avoid nephrotoxic medications including NSAIDs and Vanc/Zosyn combo  Altered mental status, improved     -Likely multifactorial: uremia with gabapentin and cefepime contributing. -Hold further gabapentin and benzodiazepine -cefepime switched to zosyn -improved with HD  Hypernatremia -was hyponatremic (likely related to free water retention from AKI), now likely related to dehydration. Encourage PO hydration, 137Na bath on 1/11, met panel pending  Metabolic acidosis: Likely associated with AKI.    C/w nahco3 1332m tid, can stop this if requiring further dialysis  Osteomyelitis: of shoulders.    Switched to zosyn.  ID and Ortho following  CHF/HTN: Reduced ejection fraction.  Can continue Imdur and carvedilol.  Hold Entresto for now.    Recommendations conveyed to primary service.    [facility] 08/27/2020 11:09  AM  ___________________________________________________________  CC: AKI  Interval History/Subjective:  Mentation significantly better today. Tolerated HD yesterday. Limiting factor was SOB which improved with deep suctioning. Conversant today and following commands. Net UF 1L yesterday. Met panel pending   Medications:  Current Facility-Administered Medications  Medication Dose Route Frequency Provider Last Rate Last Admin  . 0.9 %  sodium chloride infusion   Intravenous PRN PDarliss Cheney MD 10 mL/hr at 08/27/20 0000 Infusion Verify at 08/27/20 0000  . acetaminophen (TYLENOL) tablet 1,000 mg  1,000 mg Oral Q6H PRN NNeena Rhymes MD   1,000 mg at 08/23/20 0931  . albuterol (VENTOLIN HFA) 108 (90 Base) MCG/ACT inhaler 2 puff  2 puff Inhalation Q6H PRN Norins, MHeinz Knuckles MD      . ARIPiprazole (ABILIFY) tablet 5 mg  5 mg Oral Daily Norins, MHeinz Knuckles MD      . aspirin EC tablet 81 mg  81 mg Oral Daily Pahwani, Ravi, MD      . buPROPion (WELLBUTRIN XL) 24 hr tablet 150 mg  150 mg Oral Daily Pahwani, Ravi, MD      . chlorhexidine (PERIDEX) 0.12 % solution 15 mL  15 mL Mouth Rinse BID PDarliss Cheney MD   15 mL at 08/26/20 2124  . Chlorhexidine Gluconate Cloth 2 % PADS 6 each  6 each Topical Daily PDarliss Cheney MD   6 each at 08/27/20 [id number] . ferrous sulfate tablet 325 mg  325 mg Oral Daily Norins, MHeinz Knuckles MD      . heparin injection 5,000 Units  5,000 Units Subcutaneous Q8H BEinar Grad RPH   5,000 Units at 08/27/20 0501  . hydrALAZINE (APRESOLINE) injection 10 mg  10 mg Intravenous Q6H PRN PDarliss Cheney MD      .  insulin aspart (novoLOG) injection 0-9 Units  0-9 Units Subcutaneous TID WC Darliss Cheney, MD   1 Units at 08/25/20 1122  . isosorbide mononitrate (IMDUR) 24 hr tablet 60 mg  60 mg Oral Daily Norins, Heinz Knuckles, MD      . lidocaine (XYLOCAINE) 1 % (with pres) injection    PRN Markus Daft, MD   10 mL at 08/25/20 1632  . MEDLINE mouth rinse  15 mL Mouth Rinse q12n4p  Pahwani, Ravi, MD      . metoprolol tartrate (LOPRESSOR) injection 5 mg  5 mg Intravenous Q6H Darliss Cheney, MD   5 mg at 08/27/20 0501  . nitroGLYCERIN (NITROSTAT) SL tablet 0.4 mg  0.4 mg Sublingual Q5 min PRN Norins, Heinz Knuckles, MD      . pantoprazole (PROTONIX) injection 40 mg  40 mg Intravenous Q24H Darliss Cheney, MD   40 mg at 08/26/20 1054  . piperacillin-tazobactam (ZOSYN) IVPB 2.25 g  2.25 g Intravenous Q8H Pahwani, Ravi, MD 100 mL/hr at 08/27/20 0501 2.25 g at 08/27/20 0501  . rOPINIRole (REQUIP) tablet 1 mg  1 mg Oral BID Norins, Heinz Knuckles, MD   1 mg at 08/22/20 2314  . senna (SENOKOT) tablet 8.6 mg  1 tablet Oral BID Darliss Cheney, MD   8.6 mg at 08/22/20 2314  . sodium bicarbonate tablet 1,300 mg  1,300 mg Oral TID Darliss Cheney, MD      . sodium chloride flush (NS) 0.9 % injection 10-40 mL  10-40 mL Intracatheter PRN Darliss Cheney, MD      . tamsulosin (FLOMAX) capsule 0.4 mg  0.4 mg Oral BID Norins, Heinz Knuckles, MD   0.4 mg at 08/22/20 2314  . ticagrelor (BRILINTA) tablet 90 mg  90 mg Oral BID Neena Rhymes, MD   90 mg at 08/22/20 2315      Review of Systems: Neg 12-point review of systems  Physical Exam: Vitals:   08/27/20 0340 08/27/20 0721  BP: (!) 152/73 (!) 153/66  Pulse: 90 84  Resp: 15 14  Temp: 98.9 F (37.2 C) 98.7 F (37.1 C)  SpO2: 100% 99%   No intake/output data recorded.  Intake/Output Summary (Last 24 hours) at 08/27/2020 1109 Last data filed at 08/27/2020 6237 Gross per 24 hour  Intake 246.76 ml  Output 1950 ml  Net -1703.24 ml   Constitutional: Ill-appearing, lying in bed ENMT: ears and nose without scars or lesions, dry mucosal membranes CV: Tachycardic, no edema bilateral lower extremities Respiratory: coarse breath sounds bilaterally, normal work of breathing Gastrointestinal: soft, non-tender, no palpable masses or hernias Skin: no visible lesions or rashes Neuro: awake but still slightly altered, now conversant, following  commands   Test Results I personally reviewed new and old clinical labs and radiology tests Lab Results  Component Value Date   NA 146 (H) 08/26/2020   K 3.7 08/26/2020   CL 110 08/26/2020   CO2 15 (L) 08/26/2020   BUN 95 (H) 08/26/2020   CREATININE 5.30 (H) 08/26/2020   GFR 118.86 03/19/2015   GLU 116 12/01/2017   CALCIUM 8.8 (L) 08/26/2020   ALBUMIN 2.2 (L) 08/25/2020   PHOS 3.2 07/18/2020

## 2020-08-27 NOTE — Progress Notes (Signed)
PROGRESS NOTE    [name]  LPF:[id number] DOB: [birth year] DOA: 08/22/2020 PCP: Doreatha Lew, MD   Brief Narrative:  HPI: [name] is a 75 y.o. male with medical history significant of multiple medical problems including chronic systolic heart failure, DM with completication of neuropathy, PAD, ASVD diffuse. He has a h/o chronic shoulder problems. He was admitted 07/09/20-08/01/20 for septic arthritis both shoulders with GBS and Pseudomonas with septic shock. He was also found to have osteomyelitis right humerus. A PICC line was placed and the patient was discharged on 8 weeks of cefipeme 2g TID to end January 28th.  He was seen by Dr. [name] for ID follow-up 08/21/20. At that encounter Dr. [name] recommended the patient be admitted for continue care, management of renal failure and continued orthopedic consultation. It took until late on the day of admission, 08/22/20, for a bed to be available.   Patient reports he has been able to eat and drink. He has been taking his medications. He reports that both shoulders cause him a lot of pain.     ED Course: Patient not seen in ED - direct admit.  Assessment & Plan:   Principal Problem:   Septic arthritis of shoulder ([place]) Active Problems:   Hyperlipidemia   Essential hypertension   BPH (benign prostatic hyperplasia)   Hyponatremia   Generalized anxiety disorder   Restless leg syndrome   Chronic systolic congestive heart failure, NYHA class 3 (HCC)   Hyperlipidemia associated with type 2 diabetes mellitus (HCC)   Diabetes mellitus type II, non insulin dependent (HCC)   AKI (acute kidney injury) ([place])   Osteomyelitis of right humerus (HCC)   Encephalopathy acute   1. Septic arthritis - bilateral shoulder/osteomyelitis humerus. He has had multiple procedures and aspirations. MRI bilateral shoulder ordered and could not be completed yesterday due to patient's encephalopathy and inability to follow commands.  Seen by  orthopedics, they opined that there is no surgical need at this point in time.  ID is on board.  Antibiotics switched from cefepime to Zosyn.  2. Acute renal failure: Patient's renal function was completely normal just 3 weeks ago.  Comes in with a creatinine of 4.13 which worsened here.  Could be multifactorial such as secondary to sepsis, AIN.  Nephrology on board.  Underwent temporary catheter placement on 06/25/2021 and first session of hemodialysis on 08/26/2020.  3. Chronic hyponatremia: Improving.  4. HTN-controlled.  Continue home medications.  However due to obtundation, he is unable to take any p.o. medications and since his blood pressure is within normal range so I will continue him on as needed IV hydralazine.  5.  Diabetes mellitus type 2: Blood sugar fairly controlled.  Continue SSI.  6. Chronic systolic heart failure - last Echo 07/10/20  EF 50-55%: Appears euvolemic.  Hold Lasix and Entresto.  7. HLD - continue home meds.  8. GAD - patient with complex psych history including anxiety, PTSD, paranois, depression.  Resume home medications once able to take p.o.  9. Restless leg syndrome - continue home meds.   10. BPH -continue home medications when able to take p.o.  11.  Acute uremic/toxic/multifactorial encephalopathy: CT head negative.  Due to the concern of multifactorial, cefepime was switched to Zosyn however that did not make any difference but since patient had received first session of hemodialysis on 08/26/2020, patient's mental status is much better today.  He is talkative, answering questions appropriately and following commands.  Hopefully with another session  of dialysis today, he will be even better tomorrow.  12.  History of CAD s/p CABG and now with new onset atrial fibrillation: Early morning of 08/24/2020, there was some concern that patient had developed brief episode of atrial fibrillation.  Cardiology was consulted.  Per cardiology, he did not have  atrial fibrillation.  He has remained in sinus rhythm since then.  13.  Metabolic acidosis: Patient received bicarb.  Will defer to nephrology.  14. acute respiratory failure with hypoxia: Likely secondary to volume overload.  Currently requiring only 4 L and is very comfortable.  Hopefully this can be managed with dialysis.   DVT prophylaxis: Consult pharmacy to start on heparin.   Code Status: Full Code unfortunately, patient's next of kin/son is unreachable to make any decisions about CODE STATUS and patient's only contact who is his friend does not have legal authority to do so.  In the meantime, patient remains full code by default.  Per patient's friend, patient's son is estranged and is never reachable. Family Communication: Discussed with patient's friend yesterday.  We will update her later today.  Status is: Inpatient  Remains inpatient appropriate because:Inpatient level of care appropriate due to severity of illness   Dispo: The patient is from: Home              Anticipated d/c is to: Home              Anticipated d/c date is: > 3 days              Patient currently is not medically stable to d/c.        Estimated body mass index is 26.7 kg/m as calculated from the following:   Height as of this encounter: 6' (1.829 m).   Weight as of this encounter: 89.3 kg.      Nutritional status:               Consultants:   ID  Orthopedics  Nephrology  PCCM  Cardiology  Procedures:   None  Antimicrobials:  Anti-infectives (From admission, onward)   Start     Dose/Rate Route Frequency Ordered Stop   08/25/20 2200  piperacillin-tazobactam (ZOSYN) IVPB 2.25 g        2.25 g 100 mL/hr over 30 Minutes Intravenous Every 8 hours 08/25/20 1230     08/24/20 1800  piperacillin-tazobactam (ZOSYN) IVPB 2.25 g  Status:  Discontinued        2.25 g 100 mL/hr over 30 Minutes Intravenous Every 6 hours 08/24/20 1256 08/25/20 1230   08/24/20 1015   piperacillin-tazobactam (ZOSYN) IVPB 3.375 g  Status:  Discontinued        3.375 g 12.5 mL/hr over 240 Minutes Intravenous Every 8 hours 08/24/20 0925 08/24/20 1256   08/23/20 1000  ceFEPIme (MAXIPIME) 2 g in sodium chloride 0.9 % 100 mL IVPB  Status:  Discontinued        2 g 200 mL/hr over 30 Minutes Intravenous Every 24 hours 08/22/20 2355 08/24/20 0848   08/22/20 2245  ceFEPime (MAXIPIME) IVPB  Status:  Discontinued       Note to Pharmacy: Indication:  Septic arthritis/osteomyelitis  First Dose: No Last Day of Therapy:  09/12/2020 Labs - Once weekly:  CBC/D and BMP, Labs - Every other week:  ESR and CRP Method of administration: IV PushPharmacy to consult re: dose adjust with renal failure   2 g Intravenous Every 8 hours 08/22/20 2155 08/22/20 2354  Subjective: Patient seen and examined.  He is awake and having conversation and following commands appropriately.  No complaints.  Objective: Vitals:   08/27/20 0145 08/27/20 0256 08/27/20 0340 08/27/20 0721  BP:   (!) 152/73 (!) 153/66  Pulse:   90 84  Resp: _0 Temp:   98.9 F (37.2 C) 98.7 F (37.1 C)  TempSrc:   Axillary Axillary  SpO2: 90% 98% 100% 99%  Weight:      Height:        Intake/Output Summary (Last 24 hours) at 08/27/2020 1003 Last data filed at 08/27/2020 0724 Gross per 24 hour  Intake 246.76 ml  Output 1950 ml  Net -1703.24 ml   Filed Weights   08/22/20 2059  Weight: 89.3 kg    Examination:  General exam: Appears calm and comfortable  Respiratory system: Has upper airway audible sounds but otherwise clear to auscultation. Respiratory effort normal. Cardiovascular system: S1 & S2 heard, RRR. No JVD, murmurs, rubs, gallops or clicks. No pedal edema. Gastrointestinal system: Abdomen is nondistended, soft and nontender. No organomegaly or masses felt. Normal bowel sounds heard. Central nervous system: Alert and oriented x2. No focal neurological deficits. Extremities: Symmetric 5 x 5  power. Skin: No rashes, lesions or ulcers.   Data Reviewed: I have personally reviewed following labs and imaging studies  CBC: Recent Labs  Lab 08/22/20 2201 08/24/20 0052 08/25/20 0444 08/26/20 0409 08/27/20 0458  WBC 6.8 6.3 7.0 8.8 8.0  NEUTROABS 4.3 4.4 4.6 5.7 5.2  HGB 9.7* 10.3* 9.7* 10.3* 10.3*  HCT 29.6* 32.5* 30.1* 32.1* 32.4*  MCV 88.9 89.0 88.5 89.4 89.8  PLT 242 255 270 289 333   Basic Metabolic Panel: Recent Labs  Lab 08/22/20 2201 08/23/20 0323 08/24/20 0052 08/25/20 0444 08/26/20 1020  NA 129* 130* 133* 142 146*  K 5.3* 5.0 4.6 3.8 3.7  CL 101 103 105 108 110  CO2 14* 15* 13* 17* 15*  GLUCOSE 87 104* 101* 108* 136*  BUN 86* 86* 89* 94* 95*  CREATININE 4.13* 4.15* 4.55* 5.17* 5.30*  CALCIUM 9.4 9.1 8.8* 8.7* 8.8*  MG  --   --  2.5*  --   --    GFR: Estimated Creatinine Clearance: 13.4 mL/min (A) (by C-G formula based on SCr of 5.3 mg/dL (H)). Liver Function Tests: Recent Labs  Lab 08/22/20 2201 08/24/20 0052 08/25/20 0444  AST _1 ALT _2 ALKPHOS 106 100 83  BILITOT 0.7 0.7 1.0  PROT 7.3 6.9 6.6  ALBUMIN 2.6* 2.3* 2.2*   No results for input(s): LIPASE, AMYLASE in the last 168 hours. No results for input(s): AMMONIA in the last 168 hours. Coagulation Profile: No results for input(s): INR, PROTIME in the last 168 hours. Cardiac Enzymes: No results for input(s): CKTOTAL, CKMB, CKMBINDEX, TROPONINI in the last 168 hours. BNP (last 3 results) No results for input(s): PROBNP in the last 8760 hours. HbA1C: No results for input(s): HGBA1C in the last 72 hours. CBG: Recent Labs  Lab 08/26/20 0933 08/26/20 1137 08/26/20 1620 08/26/20 2208 08/27/20 0612  GLUCAP 128* 122* 120* 120* 117*   Lipid Profile: No results for input(s): CHOL, HDL, LDLCALC, TRIG, CHOLHDL, LDLDIRECT in the last 72 hours. Thyroid Function Tests: No results for input(s): TSH, T4TOTAL, FREET4, T3FREE, THYROIDAB in the last 72 hours. Anemia Panel: No  results for input(s): VITAMINB12, FOLATE, FERRITIN, TIBC, IRON, RETICCTPCT in the last 72 hours. Sepsis Labs: Recent Labs  Lab 08/23/20  2247 08/24/20 0052  LATICACIDVEN 0.7 0.7    Recent Results (from the past 240 hour(s))  SARS CORONAVIRUS 2 (TAT 6-24 HRS) Nasopharyngeal Nasopharyngeal Swab     Status: None   Collection Time: 08/22/20 11:57 PM   Specimen: Nasopharyngeal Swab  Result Value Ref Range Status   SARS Coronavirus 2 NEGATIVE NEGATIVE Final    Comment: (NOTE) SARS-CoV-2 target nucleic acids are NOT DETECTED.  The SARS-CoV-2 RNA is generally detectable in upper and lower respiratory specimens during the acute phase of infection. Negative results do not preclude SARS-CoV-2 infection, do not rule out co-infections with other pathogens, and should not be used as the sole basis for treatment or other patient management decisions. Negative results must be combined with clinical observations, patient history, and epidemiological information. The expected result is Negative.  Fact Sheet for Patients: SugarRoll.be  Fact Sheet for Healthcare Providers: https://www.woods-mathews.com/  This test is not yet approved or cleared by the [place] FDA and  has been authorized for detection and/or diagnosis of SARS-CoV-2 by FDA under an Emergency Use Authorization (EUA). This EUA will remain  in effect (meaning this test can be used) for the duration of the COVID-19 declaration under Se ction 564(b)(1) of the Act, 21 U.S.C. section 360bbb-3(b)(1), unless the authorization is terminated or revoked sooner.  Performed at [facility], [place] [address] [place], [place] [address]   MRSA PCR Screening     Status: None   Collection Time: 08/23/20  9:30 PM   Specimen: Nasal Mucosa; Nasopharyngeal  Result Value Ref Range Status   MRSA by PCR NEGATIVE NEGATIVE Final    Comment:        The GeneXpert MRSA Assay (FDA approved for NASAL  specimens only), is one component of a comprehensive MRSA colonization surveillance program. It is not intended to diagnose MRSA infection nor to guide or monitor treatment for MRSA infections. Performed at [facility], [place] [address], [place], [place] [address]       Radiology Studies: IR Fluoro Guide CV Line Left  Result Date: 08/25/2020 INDICATION: 23 year old with acute kidney injury and needs hemodialysis catheter. EXAM: FLUOROSCOPIC AND ULTRASOUND GUIDED PLACEMENT OF A NON-TUNNELED DIALYSIS CATHETER Physician: Stephan Minister. Henn, MD MEDICATIONS: None ANESTHESIA/SEDATION: None FLUOROSCOPY TIME:  Fluoroscopy Time: 30 seconds, 2 mGy COMPLICATIONS: None immediate. PROCEDURE: Informed consent could not be obtained because of the patient's altered mental status. Emergency consent was obtained. The patient was placed supine on the interventional table. Ultrasound confirmed a patent left internal vein. Ultrasound images were obtained for documentation. The left neck was prepped and draped in a sterile fashion. The left neck was anesthetized with 1% lidocaine. Maximal barrier sterile technique was utilized including caps, mask, sterile gowns, sterile gloves, sterile drape, hand hygiene and skin antiseptic. A small incision was made with #11 blade scalpel. A 21 gauge needle directed into the left internal jugular vein with ultrasound guidance. A micropuncture dilator set was placed. A 20 cm Mahurkar catheter was selected. The catheter was advanced over a wire and positioned in the superior vena cava. Fluoroscopic images were obtained for documentation. Both dialysis lumens were found to aspirate and flush well. The proper amount of heparin was flushed in both lumens. The central venous lumen was flushed with normal saline. Catheter was sutured to skin. FINDINGS: Dialysis catheter tip in the superior vena cava. Existing tunneled right jugular central venous catheter with tip at the superior cavoatrial  junction. IMPRESSION: Successful placement of a left jugular non-tunneled  dialysis catheter using ultrasound and fluoroscopic guidance. Electronically Signed   By: Markus Daft M.D.   On: 08/25/2020 17:56   IR US Guide Vasc Access Left  Result Date: 08/25/2020 INDICATION: 56 year old with acute kidney injury and needs hemodialysis catheter. EXAM: FLUOROSCOPIC AND ULTRASOUND GUIDED PLACEMENT OF A NON-TUNNELED DIALYSIS CATHETER Physician: Stephan Minister. Henn, MD MEDICATIONS: None ANESTHESIA/SEDATION: None FLUOROSCOPY TIME:  Fluoroscopy Time: 30 seconds, 2 mGy COMPLICATIONS: None immediate. PROCEDURE: Informed consent could not be obtained because of the patient's altered mental status. Emergency consent was obtained. The patient was placed supine on the interventional table. Ultrasound confirmed a patent left internal vein. Ultrasound images were obtained for documentation. The left neck was prepped and draped in a sterile fashion. The left neck was anesthetized with 1% lidocaine. Maximal barrier sterile technique was utilized including caps, mask, sterile gowns, sterile gloves, sterile drape, hand hygiene and skin antiseptic. A small incision was made with #11 blade scalpel. A 21 gauge needle directed into the left internal jugular vein with ultrasound guidance. A micropuncture dilator set was placed. A 20 cm Mahurkar catheter was selected. The catheter was advanced over a wire and positioned in the superior vena cava. Fluoroscopic images were obtained for documentation. Both dialysis lumens were found to aspirate and flush well. The proper amount of heparin was flushed in both lumens. The central venous lumen was flushed with normal saline. Catheter was sutured to skin. FINDINGS: Dialysis catheter tip in the superior vena cava. Existing tunneled right jugular central venous catheter with tip at the superior cavoatrial junction. IMPRESSION: Successful placement of a left jugular non-tunneled dialysis catheter using  ultrasound and fluoroscopic guidance. Electronically Signed   By: Markus Daft M.D.   On: 08/25/2020 17:56   DG CHEST PORT 1 VIEW  Result Date: 08/26/2020 CLINICAL DATA:  Hypoxia. EXAM: PORTABLE CHEST 1 VIEW COMPARISON:  Single-view of the chest 08/23/2020. FINDINGS: New tunnel dialysis catheter with its tip in the superior vena cava noted. Right IJ catheter is unchanged. Lung volumes are low with mild basilar atelectasis. Heart size is mildly enlarged. The patient is status post CABG. No pneumothorax or pleural effusion. IMPRESSION: No acute disease. Electronically Signed   By: Inge Rise M.D.   On: 08/26/2020 10:06    Scheduled Meds: . ARIPiprazole  5 mg Oral Daily  . aspirin EC  81 mg Oral Daily  . buPROPion  150 mg Oral Daily  . chlorhexidine  15 mL Mouth Rinse BID  . Chlorhexidine Gluconate Cloth  6 each Topical Daily  . ferrous sulfate  325 mg Oral Daily  . heparin injection (subcutaneous)  5,000 Units Subcutaneous Q8H  . insulin aspart  0-9 Units Subcutaneous TID WC  . isosorbide mononitrate  60 mg Oral Daily  . mouth rinse  15 mL Mouth Rinse q12n4p  . metoprolol tartrate  5 mg Intravenous Q6H  . pantoprazole (PROTONIX) IV  40 mg Intravenous Q24H  . rOPINIRole  1 mg Oral BID  . senna  1 tablet Oral BID  . sodium bicarbonate  1,300 mg Oral TID  . tamsulosin  0.4 mg Oral BID  . ticagrelor  90 mg Oral BID   Continuous Infusions: . sodium chloride 10 mL/hr at 08/27/20 0000  . piperacillin-tazobactam (ZOSYN)  IV 2.25 g (08/27/20 0501)     LOS: 5 days   Time spent: 30 minutes   Darliss Cheney, MD [facility]  08/27/2020, 10:03 AM   To contact the attending provider between 7A-7P or the covering provider  during after hours 7P-7A, please log into the web site [email or web address].

## 2020-08-27 NOTE — Progress Notes (Signed)
[facility]   Reason for visit: Follow up on osteomyelitis  Interval History: had dialysis yesterday, remains afebrile, WBC wnl.   Day 4 piperacillin/tazobactam  Physical Exam: Constitutional:  Vitals:   08/27/20 0340 08/27/20 0721  BP: (!) 152/73 (!) 153/66  Pulse: 90 84  Resp: 15 14  Temp: 98.9 F (37.2 C) 98.7 F (37.1 C)  SpO2: 100% 99%   patient appears in NAD Respiratory: normal respiratory effort; CTA B Cardiovascular: RRR Neuro: alert and responds to questions more appropriately  Review of Systems: GI: no diarrhea Skin: no rash  Lab Results  Component Value Date   WBC 8.0 08/27/2020   HGB 10.3 (L) 08/27/2020   HCT 32.4 (L) 08/27/2020   MCV 89.8 08/27/2020   PLT 268 08/27/2020    Lab Results  Component Value Date   CREATININE 5.30 (H) 08/26/2020   BUN 95 (H) 08/26/2020   NA 146 (H) 08/26/2020   K 3.7 08/26/2020   CL 110 08/26/2020   CO2 15 (L) 08/26/2020    Lab Results  Component Value Date   ALT 13 08/25/2020   AST 19 08/25/2020   ALKPHOS 83 08/25/2020     Microbiology: Recent Results (from the past 240 hour(s))  SARS CORONAVIRUS 2 (TAT 6-24 HRS) Nasopharyngeal Nasopharyngeal Swab     Status: None   Collection Time: 08/22/20 11:57 PM   Specimen: Nasopharyngeal Swab  Result Value Ref Range Status   SARS Coronavirus 2 NEGATIVE NEGATIVE Final    Comment: (NOTE) SARS-CoV-2 target nucleic acids are NOT DETECTED.  The SARS-CoV-2 RNA is generally detectable in upper and lower respiratory specimens during the acute phase of infection. Negative results do not preclude SARS-CoV-2 infection, do not rule out co-infections with other pathogens, and should not be used as the sole basis for treatment or other patient management decisions. Negative results must be combined with clinical observations, patient history, and epidemiological information. The expected result is Negative.  Fact Sheet for  Patients: SugarRoll.be  Fact Sheet for Healthcare Providers: https://www.woods-mathews.com/  This test is not yet approved or cleared by the [place] FDA and  has been authorized for detection and/or diagnosis of SARS-CoV-2 by FDA under an Emergency Use Authorization (EUA). This EUA will remain  in effect (meaning this test can be used) for the duration of the COVID-19 declaration under Se ction 564(b)(1) of the Act, 21 U.S.C. section 360bbb-3(b)(1), unless the authorization is terminated or revoked sooner.  Performed at [facility], [place] [address], [place], [place] [address]   MRSA PCR Screening     Status: None   Collection Time: 08/23/20  9:30 PM   Specimen: Nasal Mucosa; Nasopharyngeal  Result Value Ref Range Status   MRSA by PCR NEGATIVE NEGATIVE Final    Comment:        The GeneXpert MRSA Assay (FDA approved for NASAL specimens only), is one component of a comprehensive MRSA colonization surveillance program. It is not intended to diagnose MRSA infection nor to guide or monitor treatment for MRSA infections. Performed at [facility], [place] [address], [place], [place] [address]     Impression/Plan:  1.  Right shoulder septic arthritis - no plans for further debridement.  MRI attempted but do to AMS he was unable to have it done.  Continue with plan for antibiotics through January 28th, now on piperacillin/tazobactam due to concern for AMS in the setting of cefepime.   From an ID standpoint, his shoulders  seem stable and no absolute indication for an MRI  If he remains on dialysis, could use ceftazidime with dialysis at discharge  2.   Left shoulder septic arthritis - he continues on treatment as above with current antibiotics.    3.  Renal failure - s/p dialysis and has a TDC in place.    4.  AMS - some improvement since yesterday after receiving hemodialysis.

## 2020-08-28 ENCOUNTER — Inpatient Hospital Stay (HOSPITAL_COMMUNITY): Payer: Medicare Other

## 2020-08-28 DIAGNOSIS — M009 Pyogenic arthritis, unspecified: Secondary | ICD-10-CM

## 2020-08-28 DIAGNOSIS — R4182 Altered mental status, unspecified: Secondary | ICD-10-CM | POA: Diagnosis not present

## 2020-08-28 DIAGNOSIS — N179 Acute kidney failure, unspecified: Secondary | ICD-10-CM | POA: Diagnosis not present

## 2020-08-28 DIAGNOSIS — M00819 Arthritis due to other bacteria, unspecified shoulder: Secondary | ICD-10-CM | POA: Diagnosis not present

## 2020-08-28 LAB — RENAL FUNCTION PANEL
Albumin: 2.5 g/dL — ABNORMAL LOW (ref 3.5–5.0)
Anion gap: 18 — ABNORMAL HIGH (ref 5–15)
BUN: 83 mg/dL — ABNORMAL HIGH (ref 8–23)
CO2: 20 mmol/L — ABNORMAL LOW (ref 22–32)
Calcium: 8.8 mg/dL — ABNORMAL LOW (ref 8.9–10.3)
Chloride: 114 mmol/L — ABNORMAL HIGH (ref 98–111)
Creatinine, Ser: 4.61 mg/dL — ABNORMAL HIGH (ref 0.61–1.24)
GFR, Estimated: 13 mL/min — ABNORMAL LOW (ref >60)
Glucose, Bld: 181 mg/dL — ABNORMAL HIGH (ref 70–99)
Phosphorus: 5.7 mg/dL — ABNORMAL HIGH (ref 2.5–4.6)
Potassium: 3.5 mmol/L (ref 3.5–5.1)
Sodium: 152 mmol/L — ABNORMAL HIGH (ref 135–145)

## 2020-08-28 LAB — CBC WITH DIFFERENTIAL/PLATELET
Abs Immature Granulocytes: 0.04 10*3/uL (ref 0.00–0.07)
Basophils Absolute: 0.1 10*3/uL (ref 0.0–0.1)
Basophils Relative: 1 %
Eosinophils Absolute: 0.6 10*3/uL — ABNORMAL HIGH (ref 0.0–0.5)
Eosinophils Relative: 8 %
HCT: 34.5 % — ABNORMAL LOW (ref 39.0–52.0)
Hemoglobin: 10.8 g/dL — ABNORMAL LOW (ref 13.0–17.0)
Immature Granulocytes: 1 %
Lymphocytes Relative: 12 %
Lymphs Abs: 1 10*3/uL (ref 0.7–4.0)
MCH: 28.6 pg (ref 26.0–34.0)
MCHC: 31.3 g/dL (ref 30.0–36.0)
MCV: 91.3 fL (ref 80.0–100.0)
Monocytes Absolute: 1.1 10*3/uL — ABNORMAL HIGH (ref 0.1–1.0)
Monocytes Relative: 15 %
Neutro Abs: 5 10*3/uL (ref 1.7–7.7)
Neutrophils Relative %: 63 %
Platelets: 307 10*3/uL (ref 150–400)
RBC: 3.78 MIL/uL — ABNORMAL LOW (ref 4.22–5.81)
RDW: 18.5 % — ABNORMAL HIGH (ref 11.5–15.5)
WBC: 7.9 10*3/uL (ref 4.0–10.5)
nRBC: 0 % (ref 0.0–0.2)

## 2020-08-28 LAB — CBC
HCT: 35.3 % — ABNORMAL LOW (ref 39.0–52.0)
Hemoglobin: 11.6 g/dL — ABNORMAL LOW (ref 13.0–17.0)
MCH: 29.7 pg (ref 26.0–34.0)
MCHC: 32.9 g/dL (ref 30.0–36.0)
MCV: 90.5 fL (ref 80.0–100.0)
Platelets: 283 10*3/uL (ref 150–400)
RBC: 3.9 MIL/uL — ABNORMAL LOW (ref 4.22–5.81)
RDW: 18.7 % — ABNORMAL HIGH (ref 11.5–15.5)
WBC: 7.8 10*3/uL (ref 4.0–10.5)
nRBC: 0 % (ref 0.0–0.2)

## 2020-08-28 LAB — HEMOGLOBIN AND HEMATOCRIT, BLOOD
HCT: 34.2 % — ABNORMAL LOW (ref 39.0–52.0)
Hemoglobin: 10.7 g/dL — ABNORMAL LOW (ref 13.0–17.0)

## 2020-08-28 LAB — BASIC METABOLIC PANEL
Anion gap: 18 — ABNORMAL HIGH (ref 5–15)
BUN: 83 mg/dL — ABNORMAL HIGH (ref 8–23)
CO2: 21 mmol/L — ABNORMAL LOW (ref 22–32)
Calcium: 8.9 mg/dL (ref 8.9–10.3)
Chloride: 113 mmol/L — ABNORMAL HIGH (ref 98–111)
Creatinine, Ser: 4.71 mg/dL — ABNORMAL HIGH (ref 0.61–1.24)
GFR, Estimated: 12 mL/min — ABNORMAL LOW (ref >60)
Glucose, Bld: 117 mg/dL — ABNORMAL HIGH (ref 70–99)
Potassium: 3.3 mmol/L — ABNORMAL LOW (ref 3.5–5.1)
Sodium: 152 mmol/L — ABNORMAL HIGH (ref 135–145)

## 2020-08-28 LAB — OCCULT BLOOD X 1 CARD TO LAB, STOOL: Fecal Occult Bld: POSITIVE — AB

## 2020-08-28 LAB — GLUCOSE, CAPILLARY
Glucose-Capillary: 110 mg/dL — ABNORMAL HIGH (ref 70–99)
Glucose-Capillary: 153 mg/dL — ABNORMAL HIGH (ref 70–99)
Glucose-Capillary: 198 mg/dL — ABNORMAL HIGH (ref 70–99)
Glucose-Capillary: 128 mg/dL — ABNORMAL HIGH (ref 70–99)

## 2020-08-28 MED ORDER — SODIUM CHLORIDE 0.9 % IV SOLN: 100.0000 mL | INTRAVENOUS | Status: DC | PRN

## 2020-08-28 MED ORDER — LIDOCAINE HCL (PF) 1 % IJ SOLN: 5.0000 mL | INTRAMUSCULAR | Status: DC | PRN

## 2020-08-28 MED ORDER — POTASSIUM CHLORIDE 10 MEQ/100ML IV SOLN
10.0000 meq | INTRAVENOUS | Status: AC
  Administered 2020-08-28 (×3): 10 meq via INTRAVENOUS
  Filled 2020-08-28 (×3): qty 100

## 2020-08-28 MED ORDER — LIDOCAINE-PRILOCAINE 2.5-2.5 % EX CREA
1.0000 | TOPICAL_CREAM | CUTANEOUS | Status: DC | PRN
Start: 2020-08-28 — End: 2020-08-28

## 2020-08-28 MED ORDER — LIDOCAINE-PRILOCAINE 2.5-2.5 % EX CREA
1.0000 "application " | TOPICAL_CREAM | CUTANEOUS | Status: DC | PRN
  Filled 2020-08-28: qty 5

## 2020-08-28 MED ORDER — HEPARIN SODIUM (PORCINE) 1000 UNIT/ML DIALYSIS: 1000.0000 [IU] | INTRAMUSCULAR | Status: DC | PRN

## 2020-08-28 MED ORDER — PENTAFLUOROPROP-TETRAFLUOROETH EX AERO
1.0000 "application " | INHALATION_SPRAY | CUTANEOUS | Status: DC | PRN
  Filled 2020-08-28: qty 116

## 2020-08-28 MED ORDER — ALTEPLASE 2 MG IJ SOLR: 2.0000 mg | Freq: Once | INTRAMUSCULAR | Status: DC | PRN

## 2020-08-28 MED ORDER — PANTOPRAZOLE SODIUM 40 MG IV SOLR
40.0000 mg | Freq: Two times a day (BID) | INTRAVENOUS | Status: DC
  Administered 2020-08-28 – 2020-09-01 (×9): 40 mg via INTRAVENOUS
  Filled 2020-08-28 (×11): qty 40

## 2020-08-28 MED ORDER — SODIUM CHLORIDE 0.45 % IV SOLN: INTRAVENOUS | Status: AC

## 2020-08-28 MED ORDER — PENTAFLUOROPROP-TETRAFLUOROETH EX AERO: 1.0000 "application " | INHALATION_SPRAY | CUTANEOUS | Status: DC | PRN

## 2020-08-28 MED ORDER — RESOURCE THICKENUP CLEAR PO POWD
ORAL | Status: DC | PRN
  Filled 2020-08-28 (×2): qty 125

## 2020-08-28 MED ORDER — PANTOPRAZOLE SODIUM 40 MG IV SOLR: 40.0000 mg | Freq: Two times a day (BID) | INTRAVENOUS | Status: DC

## 2020-08-28 NOTE — Progress Notes (Signed)
[facility] Infectious Disease   Reason for visit: Follow up on osteomyelitis  Interval History: remains afebrile, WBC wnl.  No complaints.   Day 5 piperacillin/tazobactam  Physical Exam: Constitutional:  Vitals:   08/28/20 0316 08/28/20 0730  BP: (!) 142/71 (!) 162/77  Pulse: 89 84  Resp: 14 (!) 21  Temp: 98 F (36.7 C) 97.9 F (36.6 C)  SpO2: 100% 99%  patient appears in nad Respiratory: nromal respiratory effort, CTA B Cardiovascular: RRR Neuro: more alert compared to yesterday, knows the year  Review of Systems: GI: no diarrhea Skin: no rash  Lab Results  Component Value Date   WBC 7.9 08/28/2020   HGB 10.8 (L) 08/28/2020   HCT 34.5 (L) 08/28/2020   MCV 91.3 08/28/2020   PLT 307 08/28/2020    Lab Results  Component Value Date   CREATININE 4.71 (H) 08/28/2020   BUN 83 (H) 08/28/2020   NA 152 (H) 08/28/2020   K 3.3 (L) 08/28/2020   CL 113 (H) 08/28/2020   CO2 21 (L) 08/28/2020    Lab Results  Component Value Date   ALT 13 08/25/2020   AST 19 08/25/2020   ALKPHOS 83 08/25/2020     Microbiology: Recent Results (from the past 240 hour(s))  SARS CORONAVIRUS 2 (TAT 6-24 HRS) Nasopharyngeal Nasopharyngeal Swab     Status: None   Collection Time: 08/22/20 11:57 PM   Specimen: Nasopharyngeal Swab  Result Value Ref Range Status   SARS Coronavirus 2 NEGATIVE NEGATIVE Final    Comment: (NOTE) SARS-CoV-2 target nucleic acids are NOT DETECTED.  The SARS-CoV-2 RNA is generally detectable in upper and lower respiratory specimens during the acute phase of infection. Negative results do not preclude SARS-CoV-2 infection, do not rule out co-infections with other pathogens, and should not be used as the sole basis for treatment or other patient management decisions. Negative results must be combined with clinical observations, patient history, and epidemiological information. The expected result is Negative.  Fact Sheet for  Patients: SugarRoll.be  Fact Sheet for Healthcare Providers: https://www.woods-mathews.com/  This test is not yet approved or cleared by the [place] FDA and  has been authorized for detection and/or diagnosis of SARS-CoV-2 by FDA under an Emergency Use Authorization (EUA). This EUA will remain  in effect (meaning this test can be used) for the duration of the COVID-19 declaration under Se ction 564(b)(1) of the Act, 21 U.S.C. section 360bbb-3(b)(1), unless the authorization is terminated or revoked sooner.  Performed at [facility], [place] [address] [place], [place] [address]   MRSA PCR Screening     Status: None   Collection Time: 08/23/20  9:30 PM   Specimen: Nasal Mucosa; Nasopharyngeal  Result Value Ref Range Status   MRSA by PCR NEGATIVE NEGATIVE Final    Comment:        The GeneXpert MRSA Assay (FDA approved for NASAL specimens only), is one component of a comprehensive MRSA colonization surveillance program. It is not intended to diagnose MRSA infection nor to guide or monitor treatment for MRSA infections. Performed at [facility], [place] [address] [place], [place] [address]   Culture, Urine     Status: Abnormal (Preliminary result)   Collection Time: 08/24/20 12:01 AM   Specimen: Urine, Random  Result Value Ref Range Status   Specimen Description URINE, RANDOM  Final   Special Requests NONE  Final   Culture (A)  Final    50,000 COLONIES/mL ENTEROCOCCUS FAECIUM SUSCEPTIBILITIES  TO FOLLOW Performed at [facility], [place] [address], [place], [place] [address]    Report Status PENDING  Incomplete    Impression/Plan:  1.  Right shoulder septic arthritis - stable and no new complaints or issues.  MRI cancelled.  I do not feel MRI indicated at this time.   He remains on pip/tazo  Plan to continue antibiotics through January 28th  2.   Left shoulder septic arthritis - he continues on antibiotics as  above.    3.  Renal failure - has received dialysis x 1 time, nephrology hopeful he has more recovery.  Will leave off of cefepime and can use piptazo or ceftazidime.    4.  AMS - continues to improve with more alertness today.  Most c/w uremia with improvement after dialysis.  Further IHD if indicated per nephrology.  Improved today so hopefully will have more renal recovery and will not need further HD.

## 2020-08-28 NOTE — Plan of Care (Signed)

## 2020-08-28 NOTE — Evaluation (Signed)
Clinical/Bedside Swallow Evaluation Patient Details  Name: [name] MRN: [id number] Date of Birth: 01/04/1946  Today's Date: 08/28/2020 Time: SLP Start Time (ACUTE ONLY): 0901 SLP Stop Time (ACUTE ONLY): 0932 SLP Time Calculation (min) (ACUTE ONLY): 31 min  Past Medical History:  Past Medical History:  Diagnosis Date  . Acute renal failure (ARF) ([place]) 08/21/2020  . Anemia    NOS  . Anxiety   . 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 BPH (benign prostatic hypertrophy)   . Broken foot Oct. 12, 2013   Right foot Fx  . CAD (coronary artery disease)   . Carotid stenosis, left   . Cellulitis and abscess of leg 03/2018   right leg  . Chronic hyponatremia   . Chronic pain syndrome   . COPD (chronic obstructive pulmonary disease) ([place])   . Depression   . Diabetes mellitus   . Diabetic foot ulcer ([place])   . DJD (degenerative joint disease)    L wrist  . DM2 (diabetes mellitus, type 2) ([place])   . GERD (gastroesophageal reflux disease)   . HLD (hyperlipidemia)   . HTN (hypertension)   . Hx of blood transfusion reaction   . Hyponatremia   . Lumbar disc disease   . MI (mitral incompetence)   . Myocardial infarction ([place])    x 2  . Osteoporosis    tx per [facility], Dr Prudencio Burly, thought due to heavy steriod use after 1978  . Peripheral neuropathy   . Peripheral vascular disease ([place])   . Restless leg syndrome   . Spine fracture    hx, multiple  . Varicose veins   . 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 Vitamin D deficiency    Past Surgical History:  Past Surgical History:  Procedure Laterality Date  . ANGIOPLASTY    . aorta bifemoral bypass grafting  09/2010  . CARDIAC CATHETERIZATION N/A 09/19/2015   Procedure: Left Heart Cath and Coronary Angiography;  Surgeon: Adrian Prows, MD;  Location: [facility];  Service: Cardiovascular;  Laterality: N/A;  . CARPAL TUNNEL RELEASE     right 2006/ left 2007  . CATARACT EXTRACTION     left 1996/  right 1997  . CORONARY ARTERY BYPASS GRAFT N/A 09/22/2015   Procedure: CORONARY ARTERY BYPASS GRAFTING (CABG) times four using the right greater saphenous vein harvested endoscopically and the left internal mammary artery.  LIMA-LAD, SEQ SVG-DIAG & OM, SVG-PD.;  Surgeon: [name], MD;  Location: [facility];  Service: Open Heart Surgery;  Laterality: N/A;  . I & D EXTREMITY Right 04/07/2018   Procedure: IRRIGATION AND DEBRIDEMENT ABSCESS RIGHT LEG;  Surgeon: Newt Minion, MD;  Location: [facility];  Service: Orthopedics;  Laterality: Right;  . INCISION AND DRAINAGE OF WOUND Right 05/07/2020   Procedure: RIGHT LONG FINGER IRRIGATION AND DEBRIDEMENT AND AMPUTATION;  Surgeon: Iran Planas, MD;  Location: [facility];  Service: Orthopedics;  Laterality: Right;  with IV sedation  . INCISION AND DRAINAGE OF WOUND Right 05/12/2020   Procedure: IRRIGATION AND DEBRIDEMENT WOUND and possible revision amputation;  Surgeon: Iran Planas, MD;  Location: [facility];  Service: Orthopedics;  Laterality: Right;  needs 60 minutes  . IR FLUORO GUIDE CV LINE LEFT  08/25/2020  . IR FLUORO GUIDE CV LINE RIGHT  07/18/2020  . IR US GUIDE VASC ACCESS LEFT  08/25/2020  . IR US GUIDE VASC ACCESS RIGHT  07/18/2020  . L  foot open repair jones fracture  2010    5th metetarsal   . left ankle ganglion cyst  1976  . left carotid endarterectomy  03/2011  . left cataract  1996   right - 1997  . left CTS  2006   R CTS - 2007  . left foot surgery  1998   R surgery/fracture - 1999  . left plantar ganglion cystectomy  1979  . left wrist/hand fusion  2008  . [place], 2004  . NASAL SINUS SURGERY     multiple- x8. last 1997 with obliteration  . REPAIR THORACIC AORTA  2017  . right hand fracture  1969  . ROTATOR CUFF REPAIR  2006   R, than repeat 2011, Dr Theda Sers  . SHOULDER ARTHROSCOPY Bilateral 07/17/2020   Procedure: Right shoulder arthroscopic lavage; Left shoulder aspiration.;  Surgeon: Justice Britain, MD;  Location: [facility];  Service: Orthopedics;  Laterality: Bilateral;  132min  . SHOULDER ARTHROSCOPY WITH LABRAL REPAIR Left 07/10/2020   Procedure: SHOULDER ARTHROSCOPY WITH WASHOUT;  Surgeon: Justice Britain, MD;  Location: [facility];  Service: Orthopedics;  Laterality: Left;  . TRIGGER FINGER RELEASE Left 11/11/2014   Procedure: LEFT LONG FINGER RELEASE TRIGGER FINGER/A-1 PULLEY;  Surgeon: Milly Jakob, MD;  Location: [facility];  Service: Orthopedics;  Laterality: Left;  . vocal surgery  1996   HPI:  Pt is a 75 y.o. male presenting with septic arthritis of shoulder and AMS. He has a history of chronic systold heart failure, DM with completication of neuropathy, PAD, ASVD diffuse, and chronic shoulder problems. Pt also has a history of GERD, blindness of left eye, COPD, and a left carotid endarterectomy. He also had vocal fold surgery in 1996. 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 Pt did not follow up for further treatment.   Assessment / Plan / Recommendation Clinical Impression  Pt consumed PO trials of thin liquids, purees, and solids with Mod cues required for attention. An immediate cough was noted intermittently with thin liquids. Nectar thick liquid was trialed in which pt consectively cosumed about 3oz with no s/s of aspiration during PO trial. A delayed cough was noted following all trials. Recomend dysphagia 3 diet with nectar thick liquids due to s/s of aspiration with thin liquids and prolonged mastication with solids. Plan to f/u with MBS to further examine risk for aspiration. SLP Visit Diagnosis: Dysphagia, unspecified (R13.10)    Aspiration Risk  Moderate aspiration risk    Diet Recommendation Dysphagia 3 (Mech soft);Nectar-thick liquid   Liquid Administration via: Cup;Straw Medication Administration: Whole meds with puree Supervision: Staff to assist with self feeding;Full supervision/cueing for compensatory  strategies Compensations: Slow rate;Small sips/bites Postural Changes: Seated upright at 90 degrees;Remain upright for at least 30 minutes after po intake    Other  Recommendations Oral Care Recommendations: Oral care BID   Follow up Recommendations  (TBD)      Frequency and Duration            Prognosis Prognosis for Safe Diet Advancement: Fair Barriers to Reach Goals: Cognitive deficits;Time post onset      Swallow Study   General HPI: Pt is a 75 y.o. male presenting with septic arthritis of shoulder and AMS. He has a history of chronic systold heart failure, DM with completication of neuropathy, PAD, ASVD diffuse, and chronic shoulder problems. Pt also has a history of GERD, blindness of left  eye, COPD, and a left carotid endarterectomy. He also had vocal fold surgery in 1996. 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 Pt did not follow up for further treatment. Type of Study: Bedside Swallow Evaluation Previous Swallow Assessment: None in chart Diet Prior to this Study: NPO Temperature Spikes Noted: No Respiratory Status: Room air History of Recent Intubation: No Behavior/Cognition: Alert;Cooperative;Pleasant mood Oral Cavity Assessment: Dried secretions Oral Care Completed by SLP: No (Pt completing upon arrival) Oral Cavity - Dentition: Dentures, top;Dentures, bottom Vision: Functional for self-feeding (Blindness of left eye) Self-Feeding Abilities: Needs assist Patient Positioning: Upright in bed Baseline Vocal Quality: Hoarse Volitional Cough: Weak Volitional Swallow: Able to elicit    Oral/Motor/Sensory Function Overall Oral Motor/Sensory Function: Within functional limits (Hyolaryngeal movement seemed reduced)   Ice Chips Ice chips: Within functional limits Presentation: Spoon   Thin Liquid Thin Liquid: Impaired Presentation: Straw;Spoon Pharyngeal  Phase Impairments: Cough - Immediate    Nectar Thick  Nectar Thick Liquid: Impaired Presentation: Straw Pharyngeal Phase Impairments: Cough - Delayed   Honey Thick Honey Thick Liquid: Not tested   Puree Puree: Within functional limits Presentation: Spoon   Solid     Solid: Impaired Oral Phase Functional Implications: Impaired mastication      Jeanine Luz., SLP Student 08/28/2020,10:42 AM

## 2020-08-28 NOTE — Progress Notes (Signed)
Nephrology Follow-Up Consult note   Assessment/Recommendations: [name] is a/an 75 y.o. male with a past medical history significant for HFrEF, DM 2, neuropathy, PAD, CAD, chronic osteomyelitis who presents with AKI and AMS  AKI, worsening; non-oliguric: Unclear cause at this time. Renal ultrasound without hydronephrosis but enlarged kidneys unclear cause.  Most likely NSAID use and Entresto contributed to AKI.  Possible intermittent hypotension. AIN in differential -given persistent AMS (which may very well be from uremia), started IHD on 1/11 (slow start protocol) and has only received one treatment. -Holding off on another treatment today given that he has normal baseline kidney function, is non-oliguric, mentation is improving, and labs are stable/flat as compared to yesterday. I am hopeful that he is turning the corner now -recommend maintaining his temp dialysis catheter for now -Renally dose meds -Continue to monitor daily Cr, Dose meds for GFR -Maintain MAP>65 for optimal renal perfusion.  -Avoid nephrotoxic medications including NSAIDs and iodinated intravenous contrast exposure unless the latter is absolutely indicated.  Preferred narcotic agents for pain control are hydromorphone, fentanyl, and methadone. Morphine should not be used. Avoid Baclofen and avoid oral sodium phosphate and magnesium citrate based laxatives / bowel preps. Continue strict Input and Output monitoring. Will monitor the patient closely with you and intervene or adjust therapy as indicated by changes in clinical status/labs    Altered mental status, improved     -Likely multifactorial: uremia with gabapentin and cefepime contributing. -Hold further gabapentin and benzodiazepine -cefepime switched to zosyn -improved with HD  Hypernatremia -secondary to decreased free water intake. Free water deficit ~1.7L -starting 1/2NS at 100cc/hr for 10hours (1 liter) to start with, encourage PO  hydration  Metabolic acidosis: Likely associated with AKI. Stable.   C/w nahco3 1300mg  tid fir biw  Osteomyelitis: of shoulders.    Switched to zosyn.  ID and Ortho following  CHF/HTN: Reduced ejection fraction.  Can continue Imdur and carvedilol.  Hold Entresto for now.    Recommendations conveyed to primary service.    [facility] 08/28/2020 10:23 AM  ___________________________________________________________  CC: AKI  Interval History/Subjective:  Mentation continues to be stable, no complaints. uop 1L   Medications:  Current Facility-Administered Medications  Medication Dose Route Frequency Provider Last Rate Last Admin  . 0.45 % sodium chloride infusion   Intravenous Continuous Gean Quint, MD      . acetaminophen (TYLENOL) tablet 1,000 mg  1,000 mg Oral Q6H PRN Neena Rhymes, MD   1,000 mg at 08/23/20 0931  . albuterol (VENTOLIN HFA) 108 (90 Base) MCG/ACT inhaler 2 puff  2 puff Inhalation Q6H PRN Norins, Heinz Knuckles, MD      . alteplase (CATHFLO ACTIVASE) injection 2 mg  2 mg Intracatheter Once PRN Gean Quint, MD      . ARIPiprazole (ABILIFY) tablet 5 mg  5 mg Oral Daily Norins, Heinz Knuckles, MD   5 mg at 08/28/20 0947  . buPROPion (WELLBUTRIN XL) 24 hr tablet 150 mg  150 mg Oral Daily Darliss Cheney, MD   150 mg at 08/28/20 0947  . chlorhexidine (PERIDEX) 0.12 % solution 15 mL  15 mL Mouth Rinse BID Darliss Cheney, MD   15 mL at 08/28/20 0949  . Chlorhexidine Gluconate Cloth 2 % PADS 6 each  6 each Topical Daily Darliss Cheney, MD   6 each at 08/27/20 [phone]  . ferrous sulfate tablet 325 mg  325 mg Oral Daily Norins, Heinz Knuckles, MD   325 mg at 08/28/20 0947  .  heparin injection 1,000 Units  1,000 Units Dialysis PRN Gean Quint, MD      . hydrALAZINE (APRESOLINE) injection 10 mg  10 mg Intravenous Q6H PRN Pahwani, Ravi, MD      . insulin aspart (novoLOG) injection 0-9 Units  0-9 Units Subcutaneous TID WC Darliss Cheney, MD   1 Units at 08/25/20  1122  . isosorbide mononitrate (IMDUR) 24 hr tablet 60 mg  60 mg Oral Daily Norins, Heinz Knuckles, MD   60 mg at 08/28/20 0947  . lidocaine (PF) (XYLOCAINE) 1 % injection 5 mL  5 mL Intradermal PRN Gean Quint, MD      . lidocaine (XYLOCAINE) 1 % (with pres) injection    PRN Markus Daft, MD   10 mL at 08/25/20 1632  . lidocaine-prilocaine (EMLA) cream 1 application  1 application Topical PRN Gean Quint, MD      . MEDLINE mouth rinse  15 mL Mouth Rinse q12n4p Pahwani, Ravi, MD      . metoprolol tartrate (LOPRESSOR) injection 5 mg  5 mg Intravenous Q6H Pahwani, Ravi, MD   5 mg at 08/28/20 0500  . nitroGLYCERIN (NITROSTAT) SL tablet 0.4 mg  0.4 mg Sublingual Q5 min PRN Norins, Heinz Knuckles, MD      . pantoprazole (PROTONIX) injection 40 mg  40 mg Intravenous Q12H Pahwani, Einar Grad, MD      . pentafluoroprop-tetrafluoroeth (GEBAUERS) aerosol 1 application  1 application Topical PRN Gean Quint, MD      . piperacillin-tazobactam (ZOSYN) IVPB 2.25 g  2.25 g Intravenous Q8H Darliss Cheney, MD   Stopped at 08/28/20 0538  . potassium chloride 10 mEq in 100 mL IVPB  10 mEq Intravenous Q1 Hr x 3 Pahwani, Ravi, MD 100 mL/hr at 08/28/20 1003 10 mEq at 08/28/20 1003  . Resource ThickenUp Clear   Oral PRN Darliss Cheney, MD      . rOPINIRole (REQUIP) tablet 1 mg  1 mg Oral BID Norins, Heinz Knuckles, MD   1 mg at 08/28/20 0947  . senna (SENOKOT) tablet 8.6 mg  1 tablet Oral BID Darliss Cheney, MD   8.6 mg at 08/28/20 0947  . sodium bicarbonate tablet 1,300 mg  1,300 mg Oral TID Darliss Cheney, MD   1,300 mg at 08/28/20 0947  . sodium chloride flush (NS) 0.9 % injection 10-40 mL  10-40 mL Intracatheter PRN Darliss Cheney, MD      . tamsulosin (FLOMAX) capsule 0.4 mg  0.4 mg Oral BID Norins, Heinz Knuckles, MD   0.4 mg at 08/28/20 3557      Review of Systems: Neg 12-point review of systems  Physical Exam: Vitals:   08/28/20 0316 08/28/20 0730  BP: (!) 142/71 (!) 162/77  Pulse: 89 84  Resp: 14 (!) 21  Temp: 98 F (36.7 C)  97.9 F (36.6 C)  SpO2: 100% 99%   Total I/O In: -  Out: 350 [Urine:350]  Intake/Output Summary (Last 24 hours) at 08/28/2020 1023 Last data filed at 08/28/2020 0900 Gross per 24 hour  Intake 418.47 ml  Output 1400 ml  Net -981.53 ml   Constitutional: Ill-appearing, lying in bed ENMT: dry mucosal membranes CV: reg rate, no edema bilateral lower extremities Respiratory: normal work of breathing Gastrointestinal: soft, non-tender, no palpable masses or hernias Skin: no visible lesions or rashes Neuro: aaox3 (self, time, place), no asterixis, speech slow and slurred (very dry mouth)   Test Results I personally reviewed new and old clinical labs and radiology tests Lab Results  Component Value  Date   NA 152 (H) 08/28/2020   K 3.3 (L) 08/28/2020   CL 113 (H) 08/28/2020   CO2 21 (L) 08/28/2020   BUN 83 (H) 08/28/2020   CREATININE 4.71 (H) 08/28/2020   GFR 118.86 03/19/2015   GLU 116 12/01/2017   CALCIUM 8.9 08/28/2020   ALBUMIN 2.2 (L) 08/25/2020   PHOS 3.2 07/18/2020

## 2020-08-28 NOTE — Care Management Important Message (Signed)
Important Message  Patient Details  Name: [name] MRN: [id number] Date of Birth: 1946/01/05   Medicare Important Message Given:  Yes     Jaysa Kise Montine Circle 08/28/2020, 3:52 PM

## 2020-08-28 NOTE — Progress Notes (Signed)
Pharmacy Antibiotic Note  [name] is a 75 y.o. male admitted on 08/22/2020 with Bilateral septic arthritis with Group B streptococcus and Pseudomonas. Pt being followed by ID as outpt and on Cefepime which was continued inpatient (planned 8 wks course with stop date of 1/28). No debridement planned by ortho.  Pt underwent iHD on 1/11, holding further HD for now pending renal function as pt is making some UOP.  Plan: Adjust Zosyn to 2.25g IV q8h   Height: 6' (182.9 cm) Weight: 89.3 kg (196 lb 13.9 oz) IBW/kg (Calculated) : 77.6  Temp (24hrs), Avg:98.4 F (36.9 C), Min:97.9 F (36.6 C), Max:98.7 F (37.1 C)  Recent Labs  Lab 08/23/20 2247 08/24/20 0052 08/25/20 0444 08/26/20 0409 08/26/20 1020 08/27/20 0458 08/27/20 1030 08/28/20 0455  WBC  --  6.3 7.0 8.8  --  8.0  --  7.9  CREATININE  --  4.55* 5.17*  --  5.30*  --  4.68* 4.71*  LATICACIDVEN 0.7 0.7  --   --   --   --   --   --     Estimated Creatinine Clearance: 15.1 mL/min (A) (by C-G formula based on SCr of 4.71 mg/dL (H)).    Allergies  Allergen Reactions  . Ivp Dye [Iodinated Diagnostic Agents]     Blood Pressure dropped----pt was pre-medicated with 13 hour prep and did fine with pre-meds--amy 03/09/11   . Sulfa Antibiotics Hives  . Sulfonamide Derivatives Hives and Itching  . Lisinopril Cough  . Metoprolol Nausea And Vomiting  . Statins Other (See Comments)    myalgias  . Propoxyphene N-Acetaminophen Other (See Comments)    Sharp pains- headache    Antimicrobials this admission: Cefepime 12/17 (Started last admission) >> 1/9 Zosyn 1/9 >>   Arrie Senate, PharmD, BCPS, [facility] Clinical Pharmacist [phone] Please check AMION for all [place] numbers 08/28/2020

## 2020-08-28 NOTE — Progress Notes (Signed)
Modified Barium Swallow Progress Note  Patient Details  Name: [name] MRN: [id number] Date of Birth: 11-03-1945  Today's Date: 08/28/2020  Modified Barium Swallow completed.  Full report located under Chart Review in the Imaging Section.  Brief recommendations include the following:  Clinical Impression  Pt presents with oropharyngeal dysphagia characterized by reduced bolus cohesion, a pharyngeal delay, reduced lingual retraction, and reduced anterior laryngeal movement. He demonstrated premature spillage to the valleculae and pyriform sinuses, mild-moderate vallecular residue, and pyriform sinus residue. Amount of residue increased with bolus size and penetration (PAS 3) of pyriform sinus residue was intermittently noted on secondary swallows of full-tsp boluses of solids. Pt inconsistently reported that he swallowed solid boluses once they had passed the posterior faucial arches and required additional boluses to be provided for a swallow to be triggered. The swallow was often triggered with >50% of the liquid bolus at the level of the pyriform sinuses and this resulted in aspiration (PAS 7) of thin and nectar thick liquids with delayed laryngeal sensation. Pt's cough was weak and propelled the aspirate above the vocal folds, but did not expel it from the larynx and it was subsequently aspirated. Postural modifications (i.e., chin tuck and L/R head turn) and use of effortful swallows were ineffective in eliminating laryngeal invasion, but no instances of penetration/aspiration were noted with honey thick liquids. A dysphagia 2 diet with honey thick liquids is recommended at this time with strict observance of swallowing precautions and full supervision to ensure observance. SLP will follow for dysphagia treatment.   Swallow Evaluation Recommendations       SLP Diet Recommendations: Dysphagia 2 (Fine chop) solids;Honey thick liquids   Liquid Administration via: Cup;Straw   Medication  Administration: Crushed with puree   Supervision: Staff to assist with self feeding;Full supervision/cueing for compensatory strategies   Compensations: Slow rate;Small sips/bites;Follow solids with liquid;Minimize environmental distractions;Multiple dry swallows after each bite/sip   Postural Changes: Seated upright at 90 degrees   Oral Care Recommendations: Oral care BID      Hillis Mcphatter I. Hardin Negus, [place], [place] Office number [phone] Pager [phone]  Horton Marshall 08/28/2020,2:00 PM

## 2020-08-28 NOTE — Progress Notes (Signed)
PROGRESS NOTE    [name]  EVO:[id number] DOB: 1946-03-14 DOA: 08/22/2020 PCP: Doreatha Lew, MD   Brief Narrative:  HPI: [name] is a 75 y.o. male with medical history significant of multiple medical problems including chronic systolic heart failure, DM with completication of neuropathy, PAD, ASVD diffuse. He has a h/o chronic shoulder problems. He was admitted 07/09/20-08/01/20 for septic arthritis both shoulders with GBS and Pseudomonas with septic shock. He was also found to have osteomyelitis right humerus. A PICC line was placed and the patient was discharged on 8 weeks of cefipeme 2g TID to end January 28th.  He was seen by Dr. [name] for ID follow-up 08/21/20. At that encounter Dr. [name] recommended the patient be admitted for continue care, management of renal failure and continued orthopedic consultation. It took until late on the day of admission, 08/22/20, for a bed to be available.   Patient reports he has been able to eat and drink. He has been taking his medications. He reports that both shoulders cause him a lot of pain.     ED Course: Patient not seen in ED - direct admit.  Assessment & Plan:   Principal Problem:   Septic arthritis of shoulder ([place]) Active Problems:   Hyperlipidemia   Essential hypertension   BPH (benign prostatic hyperplasia)   Hyponatremia   Generalized anxiety disorder   Restless leg syndrome   Chronic systolic congestive heart failure, NYHA class 3 (HCC)   Hyperlipidemia associated with type 2 diabetes mellitus (HCC)   Diabetes mellitus type II, non insulin dependent (HCC)   AKI (acute kidney injury) ([place])   Osteomyelitis of right humerus (HCC)   Encephalopathy acute   1. Septic arthritis - bilateral shoulder/osteomyelitis humerus. He has had multiple procedures and aspirations. MRI bilateral shoulder ordered and could not be completed yesterday due to patient's encephalopathy and inability to follow commands.  Seen by  orthopedics, they opined that there is no surgical need at this point in time.  ID is on board.  Per ID, MRI shoulder is not needed anymore.  Antibiotics switched from cefepime to Zosyn.  2. Acute renal failure: Patient's renal function was completely normal just 3 weeks ago.  Comes in with a creatinine of 4.13 which peaked at 5.3.  Could be multifactorial such as secondary to sepsis, AIN.  Nephrology on board.  Underwent temporary catheter placement on 06/25/2021 and first session of hemodialysis on 08/26/2020.  Creatinine improved to 4.6 and remained stable today.  Good urine output.  Nephrology defers further dialysis as he has a started to improve.  Avoid nephrotoxic agents.  3. Chronic hyponatremia: Initially sodium remained low and then became normal and now hypernatremic with sodium of 152, secondary to acute renal failure.  Will defer to nephrology.  Repeat labs in the morning.  4. HTN-controlled.  Blood pressure only slightly elevated.  Continue IV hydralazine.  If he passes swallow evaluation, will resume home p.o. medications.  5.  Diabetes mellitus type 2: Blood sugar fairly controlled.  Continue SSI.  6. Chronic systolic heart failure - last Echo 07/10/20  EF 50-55%: Appears euvolemic.  Hold Lasix and Entresto.  7. HLD - continue home meds.  8. GAD - patient with complex psych history including anxiety, PTSD, paranois, depression.  Resume home medications once able to take p.o.  9. Restless leg syndrome - continue home meds.   10. BPH -continue home medications when able to take p.o.  11.  Acute uremic encephalopathy: CT  head negative.  Due to the concern of multifactorial, cefepime was switched to Zosyn however that did not make any difference but since patient had received first session of hemodialysis on 08/26/2020, patient's mental status has started to improve and he is completely alert and oriented today.  12.  History of CAD s/p CABG and now with new onset atrial  fibrillation: Early morning of 08/24/2020, there was some concern that patient had developed brief episode of atrial fibrillation.  Cardiology was consulted.  Per cardiology, he did not have atrial fibrillation.  He has remained in sinus rhythm since then.  13.  Metabolic acidosis: Patient received bicarb.  Improving.  Will defer to nephrology.  14. acute respiratory failure with hypoxia: Likely secondary to volume overload.  Currently off of oxygen and comfortable.  15. hypokalemia: Replace.  Recheck in the morning.   DVT prophylaxis: Consult pharmacy to start on heparin.   Code Status: Full Code unfortunately, patient's next of kin/son is unreachable to make any decisions about CODE STATUS and patient's only contact who is his friend does not have legal authority to do so.  In the meantime, patient remains full code by default.  Per patient's friend, patient's son is estranged and is never reachable. Family Communication: Patient alert and oriented.  No family at bedside.  Status is: Inpatient  Remains inpatient appropriate because:Inpatient level of care appropriate due to severity of illness   Dispo: The patient is from: Home              Anticipated d/c is to: Home              Anticipated d/c date is: > 3 days              Patient currently is not medically stable to d/c.        Estimated body mass index is 26.7 kg/m as calculated from the following:   Height as of this encounter: 6' (1.829 m).   Weight as of this encounter: 89.3 kg.      Nutritional status:               Consultants:   ID  Orthopedics  Nephrology  PCCM  Cardiology  Procedures:   None  Antimicrobials:  Anti-infectives (From admission, onward)   Start     Dose/Rate Route Frequency Ordered Stop   08/25/20 2200  piperacillin-tazobactam (ZOSYN) IVPB 2.25 g        2.25 g 100 mL/hr over 30 Minutes Intravenous Every 8 hours 08/25/20 1230     08/24/20 1800  piperacillin-tazobactam  (ZOSYN) IVPB 2.25 g  Status:  Discontinued        2.25 g 100 mL/hr over 30 Minutes Intravenous Every 6 hours 08/24/20 1256 08/25/20 1230   08/24/20 1015  piperacillin-tazobactam (ZOSYN) IVPB 3.375 g  Status:  Discontinued        3.375 g 12.5 mL/hr over 240 Minutes Intravenous Every 8 hours 08/24/20 0925 08/24/20 1256   08/23/20 1000  ceFEPIme (MAXIPIME) 2 g in sodium chloride 0.9 % 100 mL IVPB  Status:  Discontinued        2 g 200 mL/hr over 30 Minutes Intravenous Every 24 hours 08/22/20 2355 08/24/20 0848   08/22/20 2245  ceFEPime (MAXIPIME) IVPB  Status:  Discontinued       Note to Pharmacy: Indication:  Septic arthritis/osteomyelitis  First Dose: No Last Day of Therapy:  09/12/2020 Labs - Once weekly:  CBC/D and BMP, Labs - Every other week:  ESR and CRP Method of administration: IV PushPharmacy to consult re: dose adjust with renal failure   2 g Intravenous Every 8 hours 08/22/20 2155 08/22/20 2354         Subjective: Seen and examined.  Primary nurse at the bedside.  Patient alert and oriented.  Following all commands.  Brushing his teeth.  Objective: Vitals:   08/28/20 0100 08/28/20 0315 08/28/20 0316 08/28/20 0730  BP:   (!) 142/71 (!) 162/77  Pulse:   89 84  Resp: 15 14 14  (!) 21  Temp:   98 F (36.7 C) 97.9 F (36.6 C)  TempSrc:   Oral Oral  SpO2: 100% 100% 100% 99%  Weight:      Height:        Intake/Output Summary (Last 24 hours) at 08/28/2020 0938 Last data filed at 08/28/2020 0630 Gross per 24 hour  Intake 418.47 ml  Output 1050 ml  Net -631.53 ml   Filed Weights   08/22/20 2059  Weight: 89.3 kg    Examination:  General exam: Appears calm and comfortable  Respiratory system: Some rhonchi at the left base. Respiratory effort normal. Cardiovascular system: S1 & S2 heard, RRR. No JVD, murmurs, rubs, gallops or clicks. No pedal edema. Gastrointestinal system: Abdomen is nondistended, soft and nontender. No organomegaly or masses felt. Normal bowel sounds  heard. Central nervous system: Alert and oriented but slightly slow in response due to weakness. No focal neurological deficits. Extremities: Symmetric 5 x 5 power.  Data Reviewed: I have personally reviewed following labs and imaging studies  CBC: Recent Labs  Lab 08/24/20 0052 08/25/20 0444 08/26/20 0409 08/27/20 0458 08/28/20 0000 08/28/20 0455  WBC 6.3 7.0 8.8 8.0  --  7.9  NEUTROABS 4.4 4.6 5.7 5.2  --  5.0  HGB 10.3* 9.7* 10.3* 10.3* 10.7* 10.8*  HCT 32.5* 30.1* 32.1* 32.4* 34.2* 34.5*  MCV 89.0 88.5 89.4 89.8  --  91.3  PLT 255 270 289 268  --  161   Basic Metabolic Panel: Recent Labs  Lab 08/24/20 0052 08/25/20 0444 08/26/20 1020 08/27/20 1030 08/28/20 0455  NA 133* 142 146* 149* 152*  K 4.6 3.8 3.7 3.5 3.3*  CL 105 108 110 112* 113*  CO2 13* 17* 15* 19* 21*  GLUCOSE 101* 108* 136* 124* 117*  BUN 89* 94* 95* 82* 83*  CREATININE 4.55* 5.17* 5.30* 4.68* 4.71*  CALCIUM 8.8* 8.7* 8.8* 8.7* 8.9  MG 2.5*  --   --   --   --    GFR: Estimated Creatinine Clearance: 15.1 mL/min (A) (by C-G formula based on SCr of 4.71 mg/dL (H)). Liver Function Tests: Recent Labs  Lab 08/22/20 2201 08/24/20 0052 08/25/20 0444  AST 17 15 19   ALT 16 13 13   ALKPHOS 106 100 83  BILITOT 0.7 0.7 1.0  PROT 7.3 6.9 6.6  ALBUMIN 2.6* 2.3* 2.2*   No results for input(s): LIPASE, AMYLASE in the last 168 hours. No results for input(s): AMMONIA in the last 168 hours. Coagulation Profile: No results for input(s): INR, PROTIME in the last 168 hours. Cardiac Enzymes: No results for input(s): CKTOTAL, CKMB, CKMBINDEX, TROPONINI in the last 168 hours. BNP (last 3 results) No results for input(s): PROBNP in the last 8760 hours. HbA1C: No results for input(s): HGBA1C in the last 72 hours. CBG: Recent Labs  Lab 08/27/20 0612 08/27/20 1122 08/27/20 1628 08/27/20 2257 08/28/20 0622  GLUCAP 117* 113* 109* 111* 110*   Lipid Profile: No results for input(s):  CHOL, HDL, LDLCALC, TRIG,  CHOLHDL, LDLDIRECT in the last 72 hours. Thyroid Function Tests: No results for input(s): TSH, T4TOTAL, FREET4, T3FREE, THYROIDAB in the last 72 hours. Anemia Panel: No results for input(s): VITAMINB12, FOLATE, FERRITIN, TIBC, IRON, RETICCTPCT in the last 72 hours. Sepsis Labs: Recent Labs  Lab 08/23/20 2247 08/24/20 0052  LATICACIDVEN 0.7 0.7    Recent Results (from the past 240 hour(s))  SARS CORONAVIRUS 2 (TAT 6-24 HRS) Nasopharyngeal Nasopharyngeal Swab     Status: None   Collection Time: 08/22/20 11:57 PM   Specimen: Nasopharyngeal Swab  Result Value Ref Range Status   SARS Coronavirus 2 NEGATIVE NEGATIVE Final    Comment: (NOTE) SARS-CoV-2 target nucleic acids are NOT DETECTED.  The SARS-CoV-2 RNA is generally detectable in upper and lower respiratory specimens during the acute phase of infection. Negative results do not preclude SARS-CoV-2 infection, do not rule out co-infections with other pathogens, and should not be used as the sole basis for treatment or other patient management decisions. Negative results must be combined with clinical observations, patient history, and epidemiological information. The expected result is Negative.  Fact Sheet for Patients: SugarRoll.be  Fact Sheet for Healthcare Providers: https://www.woods-mathews.com/  This test is not yet approved or cleared by the [place] FDA and  has been authorized for detection and/or diagnosis of SARS-CoV-2 by FDA under an Emergency Use Authorization (EUA). This EUA will remain  in effect (meaning this test can be used) for the duration of the COVID-19 declaration under Se ction 564(b)(1) of the Act, 21 U.S.C. section 360bbb-3(b)(1), unless the authorization is terminated or revoked sooner.  Performed at [facility], [place] [address], [place], [place] [address]   MRSA PCR Screening     Status: None   Collection Time: 08/23/20  9:30 PM   Specimen:  Nasal Mucosa; Nasopharyngeal  Result Value Ref Range Status   MRSA by PCR NEGATIVE NEGATIVE Final    Comment:        The GeneXpert MRSA Assay (FDA approved for NASAL specimens only), is one component of a comprehensive MRSA colonization surveillance program. It is not intended to diagnose MRSA infection nor to guide or monitor treatment for MRSA infections. Performed at [facility], [place] [address] [place], [place] [address]   Culture, Urine     Status: Abnormal (Preliminary result)   Collection Time: 08/24/20 12:01 AM   Specimen: Urine, Random  Result Value Ref Range Status   Specimen Description URINE, RANDOM  Final   Special Requests NONE  Final   Culture (A)  Final    50,000 COLONIES/mL ENTEROCOCCUS FAECIUM SUSCEPTIBILITIES TO FOLLOW Performed at [facility], [place] [address] [place], [place] [address]    Report Status PENDING  Incomplete      Radiology Studies: DG CHEST PORT 1 VIEW  Result Date: 08/26/2020 CLINICAL DATA:  Hypoxia. EXAM: PORTABLE CHEST 1 VIEW COMPARISON:  Single-view of the chest 08/23/2020. FINDINGS: New tunnel dialysis catheter with its tip in the superior vena cava noted. Right IJ catheter is unchanged. Lung volumes are low with mild basilar atelectasis. Heart size is mildly enlarged. The patient is status post CABG. No pneumothorax or pleural effusion. IMPRESSION: No acute disease. Electronically Signed   By: Inge Rise M.D.   On: 08/26/2020 10:06    Scheduled Meds: . ARIPiprazole  5 mg Oral Daily  . buPROPion  150 mg Oral Daily  . chlorhexidine  15 mL Mouth Rinse BID  . Chlorhexidine Gluconate  Cloth  6 each Topical Daily  . ferrous sulfate  325 mg Oral Daily  . insulin aspart  0-9 Units Subcutaneous TID WC  . isosorbide mononitrate  60 mg Oral Daily  . mouth rinse  15 mL Mouth Rinse q12n4p  . metoprolol tartrate  5 mg Intravenous Q6H  . pantoprazole (PROTONIX) IV  40 mg Intravenous Q12H  . rOPINIRole  1 mg Oral BID  . senna   1 tablet Oral BID  . sodium bicarbonate  1,300 mg Oral TID  . tamsulosin  0.4 mg Oral BID   Continuous Infusions: . sodium chloride 10 mL/hr at 08/28/20 0621  . piperacillin-tazobactam (ZOSYN)  IV Stopped (08/28/20 0538)     LOS: 6 days   Time spent: 28 minutes   Darliss Cheney, MD [facility]  08/28/2020, 9:38 AM   To contact the attending provider between 7A-7P or the covering provider during after hours 7P-7A, please log into the web site [email or web address].

## 2020-08-29 DIAGNOSIS — Z992 Dependence on renal dialysis: Secondary | ICD-10-CM | POA: Diagnosis not present

## 2020-08-29 DIAGNOSIS — M009 Pyogenic arthritis, unspecified: Secondary | ICD-10-CM | POA: Diagnosis not present

## 2020-08-29 DIAGNOSIS — M00819 Arthritis due to other bacteria, unspecified shoulder: Secondary | ICD-10-CM | POA: Diagnosis not present

## 2020-08-29 DIAGNOSIS — N179 Acute kidney failure, unspecified: Secondary | ICD-10-CM | POA: Diagnosis not present

## 2020-08-29 LAB — BASIC METABOLIC PANEL
Anion gap: 14 (ref 5–15)
BUN: 77 mg/dL — ABNORMAL HIGH (ref 8–23)
CO2: 23 mmol/L (ref 22–32)
Calcium: 8.8 mg/dL — ABNORMAL LOW (ref 8.9–10.3)
Chloride: 114 mmol/L — ABNORMAL HIGH (ref 98–111)
Creatinine, Ser: 4.4 mg/dL — ABNORMAL HIGH (ref 0.61–1.24)
GFR, Estimated: 13 mL/min — ABNORMAL LOW (ref >60)
Glucose, Bld: 155 mg/dL — ABNORMAL HIGH (ref 70–99)
Potassium: 3.3 mmol/L — ABNORMAL LOW (ref 3.5–5.1)
Sodium: 151 mmol/L — ABNORMAL HIGH (ref 135–145)

## 2020-08-29 LAB — GLUCOSE, CAPILLARY
Glucose-Capillary: 124 mg/dL — ABNORMAL HIGH (ref 70–99)
Glucose-Capillary: 150 mg/dL — ABNORMAL HIGH (ref 70–99)
Glucose-Capillary: 225 mg/dL — ABNORMAL HIGH (ref 70–99)
Glucose-Capillary: 169 mg/dL — ABNORMAL HIGH (ref 70–99)

## 2020-08-29 LAB — URINE CULTURE: Culture: 50000 — AB

## 2020-08-29 LAB — CBC WITH DIFFERENTIAL/PLATELET
Abs Immature Granulocytes: 0.02 10*3/uL (ref 0.00–0.07)
Basophils Absolute: 0.1 10*3/uL (ref 0.0–0.1)
Basophils Relative: 1 %
Eosinophils Absolute: 0.5 10*3/uL (ref 0.0–0.5)
Eosinophils Relative: 8 %
HCT: 32.9 % — ABNORMAL LOW (ref 39.0–52.0)
Hemoglobin: 10.2 g/dL — ABNORMAL LOW (ref 13.0–17.0)
Immature Granulocytes: 0 %
Lymphocytes Relative: 15 %
Lymphs Abs: 1 10*3/uL (ref 0.7–4.0)
MCH: 28.3 pg (ref 26.0–34.0)
MCHC: 31 g/dL (ref 30.0–36.0)
MCV: 91.4 fL (ref 80.0–100.0)
Monocytes Absolute: 1.1 10*3/uL — ABNORMAL HIGH (ref 0.1–1.0)
Monocytes Relative: 17 %
Neutro Abs: 4 10*3/uL (ref 1.7–7.7)
Neutrophils Relative %: 59 %
Platelets: 258 10*3/uL (ref 150–400)
RBC: 3.6 MIL/uL — ABNORMAL LOW (ref 4.22–5.81)
RDW: 18.6 % — ABNORMAL HIGH (ref 11.5–15.5)
WBC: 6.7 10*3/uL (ref 4.0–10.5)
nRBC: 0 % (ref 0.0–0.2)

## 2020-08-29 MED ORDER — CARVEDILOL 3.125 MG PO TABS
3.1250 mg | ORAL_TABLET | Freq: Two times a day (BID) | ORAL | Status: DC
  Administered 2020-08-29 – 2020-08-30 (×2): 3.125 mg via ORAL
  Filled 2020-08-29 (×2): qty 1

## 2020-08-29 MED ORDER — HEPARIN SODIUM (PORCINE) 5000 UNIT/ML IJ SOLN
5000.0000 [IU] | Freq: Three times a day (TID) | INTRAMUSCULAR | Status: DC
  Administered 2020-08-29 – 2020-09-06 (×25): 5000 [IU] via SUBCUTANEOUS
  Filled 2020-08-29 (×24): qty 1

## 2020-08-29 MED ORDER — TICAGRELOR 90 MG PO TABS
90.0000 mg | ORAL_TABLET | Freq: Two times a day (BID) | ORAL | Status: DC
  Administered 2020-08-29 – 2020-09-06 (×17): 90 mg via ORAL
  Filled 2020-08-29 (×17): qty 1

## 2020-08-29 MED ORDER — SODIUM CHLORIDE 0.45 % IV BOLUS
1000.0000 mL | Freq: Once | INTRAVENOUS | Status: AC
  Administered 2020-08-29: 1000 mL via INTRAVENOUS

## 2020-08-29 MED ORDER — ASPIRIN EC 81 MG PO TBEC
81.0000 mg | DELAYED_RELEASE_TABLET | Freq: Every day | ORAL | Status: DC
Start: 2020-08-29 — End: 2020-09-07
  Administered 2020-08-29 – 2020-09-06 (×9): 81 mg via ORAL
  Filled 2020-08-29 (×9): qty 1

## 2020-08-29 NOTE — Progress Notes (Signed)
Nephrology Follow-Up Consult note   Assessment/Recommendations: [name] is a/an 75 y.o. male with a past medical history significant for HFrEF, DM 2, neuropathy, PAD, CAD, chronic osteomyelitis who presents with AKI and AMS  AKI, worsening; non-oliguric: Unclear cause at this time. Renal ultrasound without hydronephrosis but enlarged kidneys unclear cause.  Most likely NSAID use and Entresto contributed to AKI.  Possible intermittent hypotension. AIN in differential -given persistent AMS (which may very well be from uremia), started IHD on 1/11 (slow start protocol). #2 treatment today. Although renal function improved, exhibiting what seems to be some uremic symptoms today hence #2 treatment. No UF, clinically dry on exam, short treatment today with low flow rates. Hopefully, will not require any further -recommend maintaining his temp dialysis catheter for now -Renally dose meds -Continue to monitor daily Cr, Dose meds for GFR -Maintain MAP>65 for optimal renal perfusion.  -Avoid nephrotoxic medications including NSAIDs and iodinated intravenous contrast exposure unless the latter is absolutely indicated.  Preferred narcotic agents for pain control are hydromorphone, fentanyl, and methadone. Morphine should not be used. Avoid Baclofen and avoid oral sodium phosphate and magnesium citrate based laxatives / bowel preps. Continue strict Input and Output monitoring. Will monitor the patient closely with you and intervene or adjust therapy as indicated by changes in clinical status/labs    Altered mental status     -Likely multifactorial: uremia with gabapentin and cefepime contributing. -Hold further gabapentin and benzodiazepine -cefepime switched to zosyn -improved with HD initially, #2 treatment today  Hypernatremia -secondary to decreased free water intake. Free water deficit ~1.6L -1/2NS today 1L total. Need to make sure he has access to free water  Metabolic acidosis: Likely  associated with AKI. Stable.   C/w nahco3 1300mg  tid fir biw  Osteomyelitis: of shoulders.    Switched to zosyn.  ID and Ortho following  CHF/HTN: Reduced ejection fraction.  Can continue Imdur and carvedilol.  Hold Entresto for now.    Recommendations conveyed to primary service.    [facility] 08/29/2020 7:45 AM  ___________________________________________________________  CC: AKI  Interval History/Subjective:  Seen on hd. Mentation worsened. Endorses dysgeusia and loss of appetite. No UF (running net even), decreasing time to 1.5hrs, low flow rates.   Medications:  Current Facility-Administered Medications  Medication Dose Route Frequency Provider Last Rate Last Admin  . acetaminophen (TYLENOL) tablet 1,000 mg  1,000 mg Oral Q6H PRN Neena Rhymes, MD   1,000 mg at 08/23/20 0931  . albuterol (VENTOLIN HFA) 108 (90 Base) MCG/ACT inhaler 2 puff  2 puff Inhalation Q6H PRN Norins, Heinz Knuckles, MD      . alteplase (CATHFLO ACTIVASE) injection 2 mg  2 mg Intracatheter Once PRN Gean Quint, MD      . ARIPiprazole (ABILIFY) tablet 5 mg  5 mg Oral Daily Norins, Heinz Knuckles, MD   5 mg at 08/28/20 0947  . buPROPion (WELLBUTRIN XL) 24 hr tablet 150 mg  150 mg Oral Daily Darliss Cheney, MD   150 mg at 08/28/20 0947  . chlorhexidine (PERIDEX) 0.12 % solution 15 mL  15 mL Mouth Rinse BID Darliss Cheney, MD   15 mL at 08/28/20 2212  . Chlorhexidine Gluconate Cloth 2 % PADS 6 each  6 each Topical Daily Darliss Cheney, MD   6 each at 08/28/20 1133  . ferrous sulfate tablet 325 mg  325 mg Oral Daily Norins, Heinz Knuckles, MD   325 mg at 08/28/20 0947  . heparin injection 1,000 Units  1,000 Units Dialysis PRN Gean Quint, MD      . hydrALAZINE (APRESOLINE) injection 10 mg  10 mg Intravenous Q6H PRN Pahwani, Ravi, MD      . insulin aspart (novoLOG) injection 0-9 Units  0-9 Units Subcutaneous TID WC Darliss Cheney, MD   2 Units at 08/28/20 1639  . isosorbide mononitrate (IMDUR)  24 hr tablet 60 mg  60 mg Oral Daily Norins, Heinz Knuckles, MD   60 mg at 08/28/20 0947  . lidocaine (PF) (XYLOCAINE) 1 % injection 5 mL  5 mL Intradermal PRN Gean Quint, MD      . lidocaine (XYLOCAINE) 1 % (with pres) injection    PRN Markus Daft, MD   10 mL at 08/25/20 1632  . lidocaine-prilocaine (EMLA) cream 1 application  1 application Topical PRN Gean Quint, MD      . MEDLINE mouth rinse  15 mL Mouth Rinse q12n4p Darliss Cheney, MD   15 mL at 08/28/20 1134  . metoprolol tartrate (LOPRESSOR) injection 5 mg  5 mg Intravenous Q6H Darliss Cheney, MD   5 mg at 08/28/20 2344  . nitroGLYCERIN (NITROSTAT) SL tablet 0.4 mg  0.4 mg Sublingual Q5 min PRN Norins, Heinz Knuckles, MD      . pantoprazole (PROTONIX) injection 40 mg  40 mg Intravenous Q12H Darliss Cheney, MD   40 mg at 08/28/20 2212  . pentafluoroprop-tetrafluoroeth (GEBAUERS) aerosol 1 application  1 application Topical PRN Gean Quint, MD      . piperacillin-tazobactam (ZOSYN) IVPB 2.25 g  2.25 g Intravenous Q8H Darliss Cheney, MD   Held at 08/29/20 [phone]  . Resource ThickenUp Clear   Oral PRN Darliss Cheney, MD      . rOPINIRole (REQUIP) tablet 1 mg  1 mg Oral BID Norins, Heinz Knuckles, MD   1 mg at 08/28/20 2214  . senna (SENOKOT) tablet 8.6 mg  1 tablet Oral BID Darliss Cheney, MD   8.6 mg at 08/28/20 2211  . sodium bicarbonate tablet 1,300 mg  1,300 mg Oral TID Darliss Cheney, MD   1,300 mg at 08/28/20 2211  . sodium chloride 0.45 % bolus 1,000 mL  1,000 mL Intravenous Once Gean Quint, MD      . sodium chloride flush (NS) 0.9 % injection 10-40 mL  10-40 mL Intracatheter PRN Pahwani, Ravi, MD      . tamsulosin (FLOMAX) capsule 0.4 mg  0.4 mg Oral BID Norins, Heinz Knuckles, MD   0.4 mg at 08/28/20 2211      Review of Systems: Neg 12-point review of systems  Physical Exam: Vitals:   08/29/20 0320 08/29/20 0649  BP: 134/81 (!) 144/75  Pulse: 97 100  Resp: 18 11  Temp: 98.6 F (37 C)   SpO2: 98% 97%   No intake/output data  recorded.  Intake/Output Summary (Last 24 hours) at 08/29/2020 0745 Last data filed at 08/29/2020 0000 Gross per 24 hour  Intake 1159.93 ml  Output 950 ml  Net 209.93 ml   Constitutional: Ill-appearing, lying in bed ENMT: dry mucosal membranes CV: reg rate, no edema bilateral lower extremities Respiratory: normal work of breathing Gastrointestinal: soft, non-tender, no palpable masses or hernias Skin: no visible lesions or rashes, poor skin turgor Neuro: awake, speech slow, tremulous   Test Results I personally reviewed new and old clinical labs and radiology tests Lab Results  Component Value Date   NA 151 (H) 08/29/2020   K 3.3 (L) 08/29/2020   CL 114 (H) 08/29/2020   CO2 23 08/29/2020  BUN 77 (H) 08/29/2020   CREATININE 4.40 (H) 08/29/2020   GFR 118.86 03/19/2015   GLU 116 12/01/2017   CALCIUM 8.8 (L) 08/29/2020   ALBUMIN 2.5 (L) 08/28/2020   PHOS 5.7 (H) 08/28/2020

## 2020-08-29 NOTE — Progress Notes (Signed)
PROGRESS NOTE    [name]  VCB:[id number] DOB: 1946-07-18 DOA: 08/22/2020 PCP: Doreatha Lew, MD   Brief Narrative:  Kamon Fahr is a 75 y.o. male with medical history significant of multiple medical problems including chronic systolic heart failure, DM with neuropathy, PAD, ASVD diffuse. He has a h/o chronic shoulder problems. He was admitted 07/09/20-08/01/20 for septic arthritis both shoulders with GBS and Pseudomonas with septic shock. He was also found to have osteomyelitis right humerus. A PICC line was placed and the patient was discharged on 8 weeks of cefipeme 2g TID to end January 28th.  He was seen by Dr. [name] for ID follow-up 08/21/20. At that encounter Dr. [name] recommended the patient be admitted for continue care, management of renal failure and continued orthopedic consultation. It took until late on the day of admission, 08/22/20, for a bed to be available. Patient was admitted under hospitalist service with acute renal failure. After admission, almost 24 hours later, he developed acute uremic encephalopathy. Nephrology had opined that his encephalopathy could very well be due to cefepime so his antibiotics were switched to Zosyn. ID was on board. He did not improve and then underwent hemodialysis. He turned the corner right after that. Nephrology held off to dialysis since he continues to do well for past 2 days however this morning again, he was slightly confused having uremic encephalopathy, he had received another session of dialysis today.  Assessment & Plan:   Principal Problem:   Septic arthritis of shoulder ([place]) Active Problems:   Hyperlipidemia   Essential hypertension   BPH (benign prostatic hyperplasia)   Hyponatremia   Generalized anxiety disorder   Restless leg syndrome   Chronic systolic congestive heart failure, NYHA class 3 (HCC)   Hyperlipidemia associated with type 2 diabetes mellitus (HCC)   Diabetes mellitus type II, non insulin  dependent (HCC)   AKI (acute kidney injury) ([place])   Osteomyelitis of right humerus (HCC)   Encephalopathy acute   1. Septic arthritis - bilateral shoulder/osteomyelitis humerus. He has had multiple procedures and aspirations. MRI bilateral shoulder ordered and could not be completed yesterday due to patient's encephalopathy and inability to follow commands.  Seen by orthopedics, they opined that there is no surgical need at this point in time.  ID is on board.  Per ID, MRI shoulder is not needed anymore.  Antibiotics switched from cefepime to Zosyn.  2. Acute renal failure: Patient's renal function was completely normal just 3 weeks ago.  Comes in with a creatinine of 4.13 which peaked at 5.3.  Could be multifactorial such as secondary to sepsis, AIN.  Nephrology on board.  Underwent temporary catheter placement on 06/25/2021 and first session of hemodialysis on 08/26/2020.  Creatinine improved to 4.6 and remained stable today.  Good urine output. Reportedly he was confused again this morning having signs of uremic encephalopathy so he had another short session of dialysis today. I saw him after dialysis, he is alert and oriented although looks tired.  3. Chronic hyponatremia: Initially sodium remained low and then became normal and now hypernatremic with sodium of 152, secondary to acute renal failure.  Will defer to nephrology.  Repeat labs in the morning.  4. HTN-controlled/sinus tachycardia. Blood pressure fairly stable. Continue IV hydralazine. He passed swallow evaluation yesterday so I will resume his carvedilol because of tachycardia.  5.  Diabetes mellitus type 2: Blood sugar fairly controlled.  Continue SSI.  6. Chronic systolic heart failure - last Echo 07/10/20  EF 50-55%: Appears euvolemic.  Hold Lasix and Entresto.  7. HLD - continue home meds.  8. GAD - patient with complex psych history including anxiety, PTSD, paranois, depression.  Resume home medications once able to take  p.o.  9. Restless leg syndrome - continue home meds.   10. BPH -continue Flomax  11.  Acute uremic encephalopathy: CT head negative.  Due to the concern of multifactorial, cefepime was switched to Zosyn however that did not make any difference but since patient had received first session of hemodialysis on 08/26/2020, patient's mental status had started to improve and then this morning he was slightly more confused per nephrology and underwent another dialysis session. I saw him after dialysis, his friend [name] was at the bedside. Patient was completely alert and oriented and full of humor although he was slightly slow in response.  12.  History of CAD s/p CABG and now with new onset atrial fibrillation: Early morning of 08/24/2020, there was some concern that patient had developed brief episode of atrial fibrillation.  Cardiology was consulted.  Per cardiology, he did not have atrial fibrillation.  He has remained in sinus rhythm since then. Now that he is able to take p.o., I will resume his aspirin and Brilinta as well as Coreg.  13.  Metabolic acidosis: Patient received bicarb.  Improving.  Will defer to nephrology.  14. acute respiratory failure with hypoxia: Likely secondary to volume overload.  Currently requiring 2 to 3 L of oxygen for comfort and likely some atelectasis playing a role as well.  15. hypokalemia: slightly low, defer to nephrology since he is getting dialysis.   DVT prophylaxis: Consulted pharmacy to start on heparin.   Code Status: Full Code I had a very lengthy discussion with the patient about CODE STATUS. His friend [name] was at the bedside. Patient was fully alert and oriented however he was having a hard time comprehending the concept of CODE STATUS and could not make a decision. We will keep him full code by default. RN and patient's friend will be to continue to discuss CODE STATUS with him. The only family member he has is his son who has been estranged from him since  last couple of years Family Communication: Patient alert and oriented.  No family at bedside.  Status is: Inpatient  Remains inpatient appropriate because:Inpatient level of care appropriate due to severity of illness   Dispo: The patient is from: Home              Anticipated d/c is to: Home              Anticipated d/c date is: > 3 days              Patient currently is not medically stable to d/c.        Estimated body mass index is 24.43 kg/m as calculated from the following:   Height as of this encounter: 6' (1.829 m).   Weight as of this encounter: 81.7 kg.      Nutritional status:               Consultants:   ID  Orthopedics  Nephrology  PCCM  Cardiology  Procedures:   None  Antimicrobials:  Anti-infectives (From admission, onward)   Start     Dose/Rate Route Frequency Ordered Stop   08/25/20 2200  piperacillin-tazobactam (ZOSYN) IVPB 2.25 g        2.25 g 100 mL/hr over 30 Minutes Intravenous Every 8  hours 08/25/20 1230     08/24/20 1800  piperacillin-tazobactam (ZOSYN) IVPB 2.25 g  Status:  Discontinued        2.25 g 100 mL/hr over 30 Minutes Intravenous Every 6 hours 08/24/20 1256 08/25/20 1230   08/24/20 1015  piperacillin-tazobactam (ZOSYN) IVPB 3.375 g  Status:  Discontinued        3.375 g 12.5 mL/hr over 240 Minutes Intravenous Every 8 hours 08/24/20 0925 08/24/20 1256   08/23/20 1000  ceFEPIme (MAXIPIME) 2 g in sodium chloride 0.9 % 100 mL IVPB  Status:  Discontinued        2 g 200 mL/hr over 30 Minutes Intravenous Every 24 hours 08/22/20 2355 08/24/20 0848   08/22/20 2245  ceFEPime (MAXIPIME) IVPB  Status:  Discontinued       Note to Pharmacy: Indication:  Septic arthritis/osteomyelitis  First Dose: No Last Day of Therapy:  09/12/2020 Labs - Once weekly:  CBC/D and BMP, Labs - Every other week:  ESR and CRP Method of administration: IV PushPharmacy to consult re: dose adjust with renal failure   2 g Intravenous Every 8 hours  08/22/20 2155 08/22/20 2354         Subjective: Patient seen and examined after dialysis. His friend [name] at the bedside. Patient was alert and oriented. Denied any complaint.  Objective: Vitals:   08/29/20 0730 08/29/20 0800 08/29/20 0830 08/29/20 0902  BP:  114/84 (!) 112/50 130/77  Pulse:      Resp: 17 17 (!) 22 16  Temp:      TempSrc:    Oral  SpO2:    98%  Weight:      Height:        Intake/Output Summary (Last 24 hours) at 08/29/2020 1006 Last data filed at 08/29/2020 0840 Gross per 24 hour  Intake 1159.93 ml  Output 600 ml  Net 559.93 ml   Filed Weights   08/22/20 2059 08/29/20 0700  Weight: 89.3 kg 81.7 kg    Examination:  General exam: Appears calm and comfortable but looks very tired and thus slow in response Respiratory system: Diminished breath sounds at the bases bilaterally. Respiratory effort normal. Cardiovascular system: S1 & S2 heard, RRR. No JVD, murmurs, rubs, gallops or clicks. No pedal edema. Gastrointestinal system: Abdomen is nondistended, soft and nontender. No organomegaly or masses felt. Normal bowel sounds heard. Central nervous system: Alert and oriented. No focal neurological deficits. Extremities: Symmetric 5 x 5 power. Skin: No rashes, lesions or ulcers.   Data Reviewed: I have personally reviewed following labs and imaging studies  CBC: Recent Labs  Lab 08/25/20 0444 08/26/20 0409 08/27/20 0458 08/28/20 0000 08/28/20 0455 08/28/20 1039 08/29/20 0320  WBC 7.0 8.8 8.0  --  7.9 7.8 6.7  NEUTROABS 4.6 5.7 5.2  --  5.0  --  4.0  HGB 9.7* 10.3* 10.3* 10.7* 10.8* 11.6* 10.2*  HCT 30.1* 32.1* 32.4* 34.2* 34.5* 35.3* 32.9*  MCV 88.5 89.4 89.8  --  91.3 90.5 91.4  PLT 270 289 268  --  307 283 149   Basic Metabolic Panel: Recent Labs  Lab 08/24/20 0052 08/25/20 0444 08/26/20 1020 08/27/20 1030 08/28/20 0455 08/28/20 1039 08/29/20 0320  NA 133*   < > 146* 149* 152* 152* 151*  K 4.6   < > 3.7 3.5 3.3* 3.5 3.3*  CL 105   < >  110 112* 113* 114* 114*  CO2 13*   < > 15* 19* 21* 20* 23  GLUCOSE 101*   < >  136* 124* 117* 181* 155*  BUN 89*   < > 95* 82* 83* 83* 77*  CREATININE 4.55*   < > 5.30* 4.68* 4.71* 4.61* 4.40*  CALCIUM 8.8*   < > 8.8* 8.7* 8.9 8.8* 8.8*  MG 2.5*  --   --   --   --   --   --   PHOS  --   --   --   --   --  5.7*  --    < > = values in this interval not displayed.   GFR: Estimated Creatinine Clearance: 16.2 mL/min (A) (by C-G formula based on SCr of 4.4 mg/dL (H)). Liver Function Tests: Recent Labs  Lab 08/22/20 2201 08/24/20 0052 08/25/20 0444 08/28/20 1039  AST _0 --   ALT _1 --   ALKPHOS 106 100 83  --   BILITOT 0.7 0.7 1.0  --   PROT 7.3 6.9 6.6  --   ALBUMIN 2.6* 2.3* 2.2* 2.5*   No results for input(s): LIPASE, AMYLASE in the last 168 hours. No results for input(s): AMMONIA in the last 168 hours. Coagulation Profile: No results for input(s): INR, PROTIME in the last 168 hours. Cardiac Enzymes: No results for input(s): CKTOTAL, CKMB, CKMBINDEX, TROPONINI in the last 168 hours. BNP (last 3 results) No results for input(s): PROBNP in the last 8760 hours. HbA1C: No results for input(s): HGBA1C in the last 72 hours. CBG: Recent Labs  Lab 08/28/20 0622 08/28/20 1109 08/28/20 1623 08/28/20 2110 08/29/20 0621  GLUCAP 110* 153* 198* 128* 124*   Lipid Profile: No results for input(s): CHOL, HDL, LDLCALC, TRIG, CHOLHDL, LDLDIRECT in the last 72 hours. Thyroid Function Tests: No results for input(s): TSH, T4TOTAL, FREET4, T3FREE, THYROIDAB in the last 72 hours. Anemia Panel: No results for input(s): VITAMINB12, FOLATE, FERRITIN, TIBC, IRON, RETICCTPCT in the last 72 hours. Sepsis Labs: Recent Labs  Lab 08/23/20 2247 08/24/20 0052  LATICACIDVEN 0.7 0.7    Recent Results (from the past 240 hour(s))  SARS CORONAVIRUS 2 (TAT 6-24 HRS) Nasopharyngeal Nasopharyngeal Swab     Status: None   Collection Time: 08/22/20 11:57 PM   Specimen: Nasopharyngeal Swab   Result Value Ref Range Status   SARS Coronavirus 2 NEGATIVE NEGATIVE Final    Comment: (NOTE) SARS-CoV-2 target nucleic acids are NOT DETECTED.  The SARS-CoV-2 RNA is generally detectable in upper and lower respiratory specimens during the acute phase of infection. Negative results do not preclude SARS-CoV-2 infection, do not rule out co-infections with other pathogens, and should not be used as the sole basis for treatment or other patient management decisions. Negative results must be combined with clinical observations, patient history, and epidemiological information. The expected result is Negative.  Fact Sheet for Patients: SugarRoll.be  Fact Sheet for Healthcare Providers: https://www.woods-mathews.com/  This test is not yet approved or cleared by the [place] FDA and  has been authorized for detection and/or diagnosis of SARS-CoV-2 by FDA under an Emergency Use Authorization (EUA). This EUA will remain  in effect (meaning this test can be used) for the duration of the COVID-19 declaration under Se ction 564(b)(1) of the Act, 21 U.S.C. section 360bbb-3(b)(1), unless the authorization is terminated or revoked sooner.  Performed at [facility], [place] [address] [place], [place] [address]   MRSA PCR Screening     Status: None   Collection Time: 08/23/20  9:30 PM   Specimen: Nasal Mucosa; Nasopharyngeal  Result Value Ref  Range Status   MRSA by PCR NEGATIVE NEGATIVE Final    Comment:        The GeneXpert MRSA Assay (FDA approved for NASAL specimens only), is one component of a comprehensive MRSA colonization surveillance program. It is not intended to diagnose MRSA infection nor to guide or monitor treatment for MRSA infections. Performed at [facility], [place] [address] [place], [place] [address]   Culture, Urine     Status: Abnormal   Collection Time: 08/24/20 12:01 AM   Specimen: Urine, Random  Result  Value Ref Range Status   Specimen Description URINE, RANDOM  Final   Special Requests   Final    NONE Performed at [facility], [place] [address] [place], [place] [address]    Culture (A)  Final    50,000 COLONIES/mL VANCOMYCIN RESISTANT ENTEROCOCCUS   Report Status 08/29/2020 FINAL  Final   Organism ID, Bacteria VANCOMYCIN RESISTANT ENTEROCOCCUS (A)  Final      Susceptibility   Vancomycin resistant enterococcus - MIC*    AMPICILLIN >=32 RESISTANT Resistant     NITROFURANTOIN 128 RESISTANT Resistant     VANCOMYCIN >=32 RESISTANT Resistant     LINEZOLID 2 SENSITIVE Sensitive     * 50,000 COLONIES/mL VANCOMYCIN RESISTANT ENTEROCOCCUS      Radiology Studies: DG Swallowing Func-Speech Pathology  Result Date: 08/28/2020 Objective Swallowing Evaluation: Type of Study: MBS-Modified Barium Swallow Study  Patient Details Name: Carlitos Bottino MRN: [id number] Date of Birth: 02-18-1946 Today's Date: 08/28/2020 Time: SLP Start Time (ACUTE ONLY): 1300 -SLP Stop Time (ACUTE ONLY): 1319 SLP Time Calculation (min) (ACUTE ONLY): 19 min Past Medical History: Past Medical History: Diagnosis Date . Acute renal failure (ARF) ([place]) 08/21/2020 . Anemia   NOS . Anxiety  . Blindness of left eye   near blindness. 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 BPH (benign prostatic hypertrophy)  . Broken foot Oct. 12, 2013  Right foot Fx . CAD (coronary artery disease)  . Carotid stenosis, left  . Cellulitis and abscess of leg 03/2018  right leg . Chronic hyponatremia  . Chronic pain syndrome  . COPD (chronic obstructive pulmonary disease) ([place])  . Depression  . Diabetes mellitus  . Diabetic foot ulcer ([place])  . DJD (degenerative joint disease)   L wrist . DM2 (diabetes mellitus, type 2) ([place])  . GERD (gastroesophageal reflux disease)  . HLD (hyperlipidemia)  . HTN (hypertension)  . Hx of blood transfusion reaction  . Hyponatremia  . Lumbar disc disease  . MI (mitral incompetence)  . Myocardial infarction ([place])   x 2 . Osteoporosis   tx per [facility], Dr  Prudencio Burly, thought due to heavy steriod use after 1978 . Peripheral neuropathy  . Peripheral vascular disease ([place])  . Restless leg syndrome  . Spine fracture   hx, multiple . Varicose veins  . 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 Vitamin D deficiency  Past Surgical History: Past Surgical History: Procedure Laterality Date . ANGIOPLASTY   . aorta bifemoral bypass grafting  09/2010 . CARDIAC CATHETERIZATION N/A 09/19/2015  Procedure: Left Heart Cath and Coronary Angiography;  Surgeon: Adrian Prows, MD;  Location: [facility];  Service: Cardiovascular;  Laterality: N/A; . CARPAL TUNNEL RELEASE    right 2006/ left 2007 . CATARACT EXTRACTION    left 1996/ right 1997 . CORONARY ARTERY BYPASS GRAFT N/A 09/22/2015  Procedure: CORONARY ARTERY BYPASS GRAFTING (CABG) times four using the right greater saphenous vein harvested endoscopically and the left  internal mammary artery.  LIMA-LAD, SEQ SVG-DIAG & OM, SVG-PD.;  Surgeon: [name], MD;  Location: [facility];  Service: Open Heart Surgery;  Laterality: N/A; . I & D EXTREMITY Right 04/07/2018  Procedure: IRRIGATION AND DEBRIDEMENT ABSCESS RIGHT LEG;  Surgeon: Newt Minion, MD;  Location: [facility];  Service: Orthopedics;  Laterality: Right; . INCISION AND DRAINAGE OF WOUND Right 05/07/2020  Procedure: RIGHT LONG FINGER IRRIGATION AND DEBRIDEMENT AND AMPUTATION;  Surgeon: Iran Planas, MD;  Location: [facility];  Service: Orthopedics;  Laterality: Right;  with IV sedation . INCISION AND DRAINAGE OF WOUND Right 05/12/2020  Procedure: IRRIGATION AND DEBRIDEMENT WOUND and possible revision amputation;  Surgeon: Iran Planas, MD;  Location: [facility];  Service: Orthopedics;  Laterality: Right;  needs 60 minutes . IR FLUORO GUIDE CV LINE LEFT  08/25/2020 . IR FLUORO GUIDE CV LINE RIGHT  07/18/2020 . IR US GUIDE VASC ACCESS LEFT  08/25/2020 . IR US GUIDE VASC ACCESS RIGHT  07/18/2020 . L foot open repair jones fracture  2010   5th metetarsal  . left ankle ganglion cyst  1976 . left  carotid endarterectomy  03/2011 . left cataract  1996  right - 1997 . left CTS  2006  R CTS - 2007 . left foot surgery  1998  R surgery/fracture - 1999 . left plantar ganglion cystectomy  1979 . left wrist/hand fusion  2008 . [place], 2004 . NASAL SINUS SURGERY    multiple- x8. last 1997 with obliteration . REPAIR THORACIC AORTA  2017 . right hand fracture  1969 . ROTATOR CUFF REPAIR  2006  R, than repeat 2011, Dr Theda Sers . SHOULDER ARTHROSCOPY Bilateral 07/17/2020  Procedure: Right shoulder arthroscopic lavage; Left shoulder aspiration.;  Surgeon: Justice Britain, MD;  Location: [facility];  Service: Orthopedics;  Laterality: Bilateral;  14mn . SHOULDER ARTHROSCOPY WITH LABRAL REPAIR Left 07/10/2020  Procedure: SHOULDER ARTHROSCOPY WITH WASHOUT;  Surgeon: SJustice Britain MD;  Location: [facility];  Service: Orthopedics;  Laterality: Left; . TRIGGER FINGER RELEASE Left 11/11/2014  Procedure: LEFT LONG FINGER RELEASE TRIGGER FINGER/A-1 PULLEY;  Surgeon: DMilly Jakob MD;  Location: [facility]  Service: Orthopedics;  Laterality: Left; . vocal surgery  1996 HPI: Pt is a 75y.o. male presenting with septic arthritis of shoulder and AMS. He has a history of chronic systold heart failure, DM with completication of neuropathy, PAD, ASVD diffuse, and chronic shoulder problems. Pt also has a history of GERD, blindness of left eye, COPD, and a left carotid endarterectomy. He also had vocal fold surgery in 1996. 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 Pt did not follow up for further treatment.  Subjective: Pt was agreeable and alert Assessment / Plan / Recommendation CHL IP CLINICAL IMPRESSIONS 08/28/2020 Clinical Impression Pt presents with oropharyngeal dysphagia characterized by reduced bolus cohesion, a pharyngeal delay, reduced lingual retraction, and reduced anterior laryngeal movement. He demonstrated premature spillage to the  valleculae and pyriform sinuses, mild-moderate vallecular residue, and pyriform sinus residue. Amount of residue increased with bolus size and penetration (PAS 3) of pyriform sinus residue was intermittently noted on secondary swallows of full-tsp boluses of solids. Pt inconsistently reported that he swallowed solid boluses once they had passed the posterior faucial arches and required additional boluses to be provided for a swallow to be triggered. The swallow was often triggered with >50% of the liquid bolus at the level of the pyriform  sinuses and this resulted in aspiration (PAS 7) of thin and nectar thick liquids with delayed laryngeal sensation. Pt's cough was weak and propelled the aspirate above the vocal folds, but did not expel it from the larynx and it was subsequently aspirated. Postural modifications (i.e., chin tuck and L/R head turn) and use of effortful swallows were ineffective in eliminating laryngeal invasion, but no instances of penetration/aspiration were noted with honey thick liquids. A dysphagia 2 diet with honey thick liquids is recommended at this time with strict observance of swallowing precautions and full supervision to ensure observance. SLP will follow for dysphagia treatment. SLP Visit Diagnosis Dysphagia, oropharyngeal phase (R13.12) Attention and concentration deficit following -- Frontal lobe and executive function deficit following -- Impact on safety and function Moderate aspiration risk   CHL IP TREATMENT RECOMMENDATION 08/28/2020 Treatment Recommendations Therapy as outlined in treatment plan below   Prognosis 08/28/2020 Prognosis for Safe Diet Advancement Fair Barriers to Reach Goals Cognitive deficits;Time post onset Barriers/Prognosis Comment -- CHL IP DIET RECOMMENDATION 08/28/2020 SLP Diet Recommendations Dysphagia 2 (Fine chop) solids;Honey thick liquids Liquid Administration via Cup;Straw Medication Administration Crushed with puree Compensations Slow rate;Small  sips/bites;Follow solids with liquid;Minimize environmental distractions;Multiple dry swallows after each bite/sip Postural Changes Seated upright at 90 degrees   CHL IP OTHER RECOMMENDATIONS 08/28/2020 Recommended Consults -- Oral Care Recommendations Oral care BID Other Recommendations --   CHL IP FOLLOW UP RECOMMENDATIONS 08/28/2020 Follow up Recommendations (No Data)   CHL IP FREQUENCY AND DURATION 08/28/2020 Speech Therapy Frequency (ACUTE ONLY) min 2x/week Treatment Duration 2 weeks      CHL IP ORAL PHASE 08/28/2020 Oral Phase Impaired Oral - Pudding Teaspoon -- Oral - Pudding Cup -- Oral - Honey Teaspoon -- Oral - Honey Cup Decreased bolus cohesion;Premature spillage Oral - Nectar Teaspoon -- Oral - Nectar Cup Decreased bolus cohesion;Premature spillage Oral - Nectar Straw Decreased bolus cohesion;Premature spillage Oral - Thin Teaspoon Decreased bolus cohesion;Premature spillage Oral - Thin Cup Decreased bolus cohesion;Premature spillage Oral - Thin Straw Decreased bolus cohesion;Premature spillage Oral - Puree -- Oral - Mech Soft -- Oral - Regular -- Oral - Multi-Consistency -- Oral - Pill Reduced posterior propulsion Oral Phase - Comment --  CHL IP PHARYNGEAL PHASE 08/28/2020 Pharyngeal Phase Impaired Pharyngeal- Pudding Teaspoon -- Pharyngeal -- Pharyngeal- Pudding Cup -- Pharyngeal -- Pharyngeal- Honey Teaspoon -- Pharyngeal -- Pharyngeal- Honey Cup Reduced tongue base retraction;Reduced anterior laryngeal mobility;Delayed swallow initiation-vallecula;Delayed swallow initiation-pyriform sinuses;Pharyngeal residue - valleculae;Pharyngeal residue - pyriform Pharyngeal -- Pharyngeal- Nectar Teaspoon -- Pharyngeal -- Pharyngeal- Nectar Cup Reduced tongue base retraction;Reduced anterior laryngeal mobility;Delayed swallow initiation-vallecula;Delayed swallow initiation-pyriform sinuses;Pharyngeal residue - valleculae;Pharyngeal residue - pyriform;Penetration/Aspiration during swallow;Penetration/Apiration after  swallow;Moderate aspiration Pharyngeal Material enters airway, passes BELOW cords and not ejected out despite cough attempt by patient Pharyngeal- Nectar Straw Reduced tongue base retraction;Reduced anterior laryngeal mobility;Delayed swallow initiation-vallecula;Delayed swallow initiation-pyriform sinuses;Pharyngeal residue - valleculae;Pharyngeal residue - pyriform;Penetration/Aspiration during swallow;Penetration/Apiration after swallow;Moderate aspiration Pharyngeal Material enters airway, passes BELOW cords and not ejected out despite cough attempt by patient Pharyngeal- Thin Teaspoon Reduced tongue base retraction;Reduced anterior laryngeal mobility;Delayed swallow initiation-vallecula;Delayed swallow initiation-pyriform sinuses;Pharyngeal residue - valleculae;Pharyngeal residue - pyriform;Penetration/Aspiration during swallow;Penetration/Apiration after swallow;Moderate aspiration Pharyngeal Material enters airway, passes BELOW cords and not ejected out despite cough attempt by patient;Material enters airway, remains ABOVE vocal cords and not ejected out Pharyngeal- Thin Cup Reduced tongue base retraction;Reduced anterior laryngeal mobility;Delayed swallow initiation-vallecula;Delayed swallow initiation-pyriform sinuses;Pharyngeal residue - valleculae;Pharyngeal residue - pyriform;Penetration/Aspiration during swallow;Penetration/Apiration after swallow;Moderate aspiration Pharyngeal Material enters airway,  passes BELOW cords and not ejected out despite cough attempt by patient Pharyngeal- Thin Straw Reduced tongue base retraction;Reduced anterior laryngeal mobility;Delayed swallow initiation-vallecula;Delayed swallow initiation-pyriform sinuses;Pharyngeal residue - valleculae;Pharyngeal residue - pyriform;Penetration/Aspiration during swallow;Penetration/Apiration after swallow;Moderate aspiration Pharyngeal Material enters airway, passes BELOW cords and not ejected out despite cough attempt by patient  Pharyngeal- Puree Reduced tongue base retraction;Reduced anterior laryngeal mobility;Delayed swallow initiation-vallecula;Delayed swallow initiation-pyriform sinuses;Pharyngeal residue - valleculae;Pharyngeal residue - pyriform;Penetration/Aspiration during swallow;Penetration/Apiration after swallow;Moderate aspiration Pharyngeal Material enters airway, remains ABOVE vocal cords and not ejected out Pharyngeal- Mechanical Soft -- Pharyngeal -- Pharyngeal- Regular Reduced tongue base retraction;Reduced anterior laryngeal mobility;Delayed swallow initiation-vallecula;Delayed swallow initiation-pyriform sinuses;Pharyngeal residue - valleculae;Pharyngeal residue - pyriform;Penetration/Aspiration during swallow;Penetration/Apiration after swallow;Moderate aspiration Pharyngeal Material enters airway, remains ABOVE vocal cords and not ejected out Pharyngeal- Multi-consistency -- Pharyngeal -- Pharyngeal- Pill Reduced tongue base retraction;Reduced anterior laryngeal mobility;Delayed swallow initiation-vallecula;Delayed swallow initiation-pyriform sinuses;Pharyngeal residue - valleculae;Pharyngeal residue - pyriform;Penetration/Aspiration during swallow;Penetration/Apiration after swallow;Moderate aspiration Pharyngeal -- Pharyngeal Comment --  CHL IP CERVICAL ESOPHAGEAL PHASE 08/28/2020 Cervical Esophageal Phase WFL Pudding Teaspoon -- Pudding Cup -- Honey Teaspoon -- Honey Cup -- Nectar Teaspoon -- Nectar Cup -- Nectar Straw -- Thin Teaspoon -- Thin Cup -- Thin Straw -- Puree -- Mechanical Soft -- Regular -- Multi-consistency -- Pill -- Cervical Esophageal Comment -- Shanika I. Hardin Negus, [place], [place] Office number [phone] Pager [phone] Horton Marshall 08/28/2020, 2:08 PM               Scheduled Meds: . ARIPiprazole  5 mg Oral Daily  . buPROPion  150 mg Oral Daily  . chlorhexidine  15 mL Mouth Rinse BID  . Chlorhexidine Gluconate Cloth  6 each Topical Daily  . ferrous  sulfate  325 mg Oral Daily  . insulin aspart  0-9 Units Subcutaneous TID WC  . isosorbide mononitrate  60 mg Oral Daily  . mouth rinse  15 mL Mouth Rinse q12n4p  . metoprolol tartrate  5 mg Intravenous Q6H  . pantoprazole (PROTONIX) IV  40 mg Intravenous Q12H  . rOPINIRole  1 mg Oral BID  . senna  1 tablet Oral BID  . sodium bicarbonate  1,300 mg Oral TID  . tamsulosin  0.4 mg Oral BID   Continuous Infusions: . piperacillin-tazobactam (ZOSYN)  IV Stopped (08/29/20 0263)  . sodium chloride       LOS: 7 days   Time spent: 35 minutes   Darliss Cheney, MD [facility]  08/29/2020, 10:06 AM   To contact the attending provider between 7A-7P or the covering provider during after hours 7P-7A, please log into the web site [email or web address].

## 2020-08-29 NOTE — Plan of Care (Signed)

## 2020-08-29 NOTE — Progress Notes (Signed)
Dr Doristine Bosworth paged and advised that patient's heart rate has been running 120's last hour, he probably missed his previously ordered (but now d/c'd) metoprolol order IV due to going to HD---he has given verbal OK to give carvidelol PO early.  Carvedilol given at [employer]

## 2020-08-29 NOTE — Progress Notes (Signed)
[place] for Infectious Disease   Reason for visit: Follow up on osteomyelitis  Interval History: just returned from dialysis.  WBC wnl, afebrile. Caregiver at bedside.  Day 6 piperacillin/tazobactam  Physical Exam: Constitutional:  Vitals:   08/29/20 0830 08/29/20 0902  BP: (!) 112/50 130/77  Pulse:    Resp: (!) 22 16  Temp:    SpO2:  98%  he is in nad Respiratory: normal respiratory effort Cardiovascular: RRR Skin: no rashes  Review of Systems: GI: no n/v Skin: no rash  Lab Results  Component Value Date   WBC 6.7 08/29/2020   HGB 10.2 (L) 08/29/2020   HCT 32.9 (L) 08/29/2020   MCV 91.4 08/29/2020   PLT 258 08/29/2020    Lab Results  Component Value Date   CREATININE 4.40 (H) 08/29/2020   BUN 77 (H) 08/29/2020   NA 151 (H) 08/29/2020   K 3.3 (L) 08/29/2020   CL 114 (H) 08/29/2020   CO2 23 08/29/2020    Lab Results  Component Value Date   ALT 13 08/25/2020   AST 19 08/25/2020   ALKPHOS 83 08/25/2020     Microbiology: Recent Results (from the past 240 hour(s))  SARS CORONAVIRUS 2 (TAT 6-24 HRS) Nasopharyngeal Nasopharyngeal Swab     Status: None   Collection Time: 08/22/20 11:57 PM   Specimen: Nasopharyngeal Swab  Result Value Ref Range Status   SARS Coronavirus 2 NEGATIVE NEGATIVE Final    Comment: (NOTE) SARS-CoV-2 target nucleic acids are NOT DETECTED.  The SARS-CoV-2 RNA is generally detectable in upper and lower respiratory specimens during the acute phase of infection. Negative results do not preclude SARS-CoV-2 infection, do not rule out co-infections with other pathogens, and should not be used as the sole basis for treatment or other patient management decisions. Negative results must be combined with clinical observations, patient history, and epidemiological information. The expected result is Negative.  Fact Sheet for Patients: SugarRoll.be  Fact Sheet for Healthcare  Providers: https://www.woods-mathews.com/  This test is not yet approved or cleared by the [place] FDA and  has been authorized for detection and/or diagnosis of SARS-CoV-2 by FDA under an Emergency Use Authorization (EUA). This EUA will remain  in effect (meaning this test can be used) for the duration of the COVID-19 declaration under Se ction 564(b)(1) of the Act, 21 U.S.C. section 360bbb-3(b)(1), unless the authorization is terminated or revoked sooner.  Performed at [facility], [place] [address] [place], [place] [address]   MRSA PCR Screening     Status: None   Collection Time: 08/23/20  9:30 PM   Specimen: Nasal Mucosa; Nasopharyngeal  Result Value Ref Range Status   MRSA by PCR NEGATIVE NEGATIVE Final    Comment:        The GeneXpert MRSA Assay (FDA approved for NASAL specimens only), is one component of a comprehensive MRSA colonization surveillance program. It is not intended to diagnose MRSA infection nor to guide or monitor treatment for MRSA infections. Performed at [facility], [place] [address], [place], [place] [address]   Culture, Urine     Status: Abnormal   Collection Time: 08/24/20 12:01 AM   Specimen: Urine, Random  Result Value Ref Range Status   Specimen Description URINE, RANDOM  Final   Special Requests   Final    NONE Performed at [facility], [place] [address], [place],  [address]    Culture (A)  Final    50,000 COLONIES/mL  VANCOMYCIN RESISTANT ENTEROCOCCUS   Report Status 08/29/2020 FINAL  Final   Organism ID, Bacteria VANCOMYCIN RESISTANT ENTEROCOCCUS (A)  Final      Susceptibility   Vancomycin resistant enterococcus - MIC*    AMPICILLIN >=32 RESISTANT Resistant     NITROFURANTOIN 128 RESISTANT Resistant     VANCOMYCIN >=32 RESISTANT Resistant     LINEZOLID 2 SENSITIVE Sensitive     * 50,000 COLONIES/mL VANCOMYCIN RESISTANT ENTEROCOCCUS    Impression/Plan:  1.  Right shoulder septic arthritis -  stable, no new concerns.  On antibiotics through January 28th.  Continue with pip/tazo  2.   Left shoulder septic arthritis - no new issues, continues on antibiotics as above  3.  Renal failure - s/p HD today.  ? Of renal recovery.    ID team will follow up again on Monday.

## 2020-08-30 DIAGNOSIS — M00812 Arthritis due to other bacteria, left shoulder: Secondary | ICD-10-CM | POA: Diagnosis not present

## 2020-08-30 DIAGNOSIS — I5022 Chronic systolic (congestive) heart failure: Secondary | ICD-10-CM | POA: Diagnosis not present

## 2020-08-30 DIAGNOSIS — G9340 Encephalopathy, unspecified: Secondary | ICD-10-CM | POA: Diagnosis not present

## 2020-08-30 DIAGNOSIS — M00819 Arthritis due to other bacteria, unspecified shoulder: Secondary | ICD-10-CM | POA: Diagnosis not present

## 2020-08-30 DIAGNOSIS — M00811 Arthritis due to other bacteria, right shoulder: Secondary | ICD-10-CM | POA: Diagnosis not present

## 2020-08-30 DIAGNOSIS — M86221 Subacute osteomyelitis, right humerus: Secondary | ICD-10-CM | POA: Diagnosis not present

## 2020-08-30 LAB — CBC WITH DIFFERENTIAL/PLATELET
Abs Immature Granulocytes: 0.05 10*3/uL (ref 0.00–0.07)
Basophils Absolute: 0.1 10*3/uL (ref 0.0–0.1)
Basophils Relative: 1 %
Eosinophils Absolute: 0.7 10*3/uL — ABNORMAL HIGH (ref 0.0–0.5)
Eosinophils Relative: 10 %
HCT: 29.3 % — ABNORMAL LOW (ref 39.0–52.0)
Hemoglobin: 9.4 g/dL — ABNORMAL LOW (ref 13.0–17.0)
Immature Granulocytes: 1 %
Lymphocytes Relative: 16 %
Lymphs Abs: 1 10*3/uL (ref 0.7–4.0)
MCH: 29.3 pg (ref 26.0–34.0)
MCHC: 32.1 g/dL (ref 30.0–36.0)
MCV: 91.3 fL (ref 80.0–100.0)
Monocytes Absolute: 1 10*3/uL (ref 0.1–1.0)
Monocytes Relative: 15 %
Neutro Abs: 3.6 10*3/uL (ref 1.7–7.7)
Neutrophils Relative %: 57 %
Platelets: 222 10*3/uL (ref 150–400)
RBC: 3.21 MIL/uL — ABNORMAL LOW (ref 4.22–5.81)
RDW: 18.7 % — ABNORMAL HIGH (ref 11.5–15.5)
WBC: 6.3 10*3/uL (ref 4.0–10.5)
nRBC: 0 % (ref 0.0–0.2)

## 2020-08-30 LAB — BASIC METABOLIC PANEL
Anion gap: 13 (ref 5–15)
BUN: 55 mg/dL — ABNORMAL HIGH (ref 8–23)
CO2: 23 mmol/L (ref 22–32)
Calcium: 8.2 mg/dL — ABNORMAL LOW (ref 8.9–10.3)
Chloride: 114 mmol/L — ABNORMAL HIGH (ref 98–111)
Creatinine, Ser: 3.71 mg/dL — ABNORMAL HIGH (ref 0.61–1.24)
GFR, Estimated: 16 mL/min — ABNORMAL LOW (ref >60)
Glucose, Bld: 141 mg/dL — ABNORMAL HIGH (ref 70–99)
Potassium: 3.2 mmol/L — ABNORMAL LOW (ref 3.5–5.1)
Sodium: 150 mmol/L — ABNORMAL HIGH (ref 135–145)

## 2020-08-30 LAB — GLUCOSE, CAPILLARY
Glucose-Capillary: 118 mg/dL — ABNORMAL HIGH (ref 70–99)
Glucose-Capillary: 135 mg/dL — ABNORMAL HIGH (ref 70–99)
Glucose-Capillary: 138 mg/dL — ABNORMAL HIGH (ref 70–99)
Glucose-Capillary: 146 mg/dL — ABNORMAL HIGH (ref 70–99)

## 2020-08-30 MED ORDER — SODIUM CHLORIDE 0.45 % IV SOLN: INTRAVENOUS | Status: DC

## 2020-08-30 MED ORDER — CARVEDILOL 6.25 MG PO TABS
6.2500 mg | ORAL_TABLET | Freq: Two times a day (BID) | ORAL | Status: DC
  Administered 2020-08-31 – 2020-09-01 (×3): 6.25 mg via ORAL
  Filled 2020-08-30 (×3): qty 1

## 2020-08-30 MED ORDER — GABAPENTIN 100 MG PO CAPS: 100.0000 mg | ORAL_CAPSULE | Freq: Every day | ORAL | Status: DC

## 2020-08-30 MED ORDER — CLONAZEPAM 0.25 MG PO TBDP
0.2500 mg | ORAL_TABLET | Freq: Two times a day (BID) | ORAL | Status: DC | PRN
  Administered 2020-09-04 – 2020-09-05 (×2): 0.25 mg via ORAL
  Filled 2020-08-30 (×2): qty 1

## 2020-08-30 MED ORDER — METOPROLOL TARTRATE 5 MG/5ML IV SOLN
5.0000 mg | Freq: Four times a day (QID) | INTRAVENOUS | Status: DC | PRN
  Filled 2020-08-30: qty 5

## 2020-08-30 MED ORDER — GABAPENTIN 100 MG PO CAPS
100.0000 mg | ORAL_CAPSULE | Freq: Two times a day (BID) | ORAL | Status: DC
  Administered 2020-08-30 – 2020-09-06 (×15): 100 mg via ORAL
  Filled 2020-08-30 (×15): qty 1

## 2020-08-30 NOTE — Evaluation (Signed)
Physical Therapy Evaluation Patient Details Name: [name] MRN: [id number] DOB: 17-Nov-1945 Today's Date: 08/30/2020   History of Present Illness  74 y.o. male with medical history significant of multiple medical problems including chronic systolic heart failure, DM with neuropathy, PAD, ASVD diffuse. He was admitted 07/09/20-08/01/20 for septic arthritis both shoulders with GBS and Pseudomonas with septic shock. He was also found to have osteomyelitis right humerus. Pt admitted on 08/22/2020 after outpatient appointment. Pt developed acute uremic encephalopathy and later underwent HDU during admission which helped improve mental status, HDU held for 2 days and encephalopathy returned.  Clinical Impression  Pt presents to PT with deficits in cognition, functional mobility, gait, balance, strength, power, and endurance. Pt is very confused during session with tangential though and disorientation. Pt requires frequent cues from PT for mobility sequencing and has very limited awareness of his deficits at this time. Pt also requires physical assistance for all mobility and nearly attempts to sit in the floor prior to PT correction, not paying attention to how far he was from the bed. Pt will benefit from PT POC to improve functional mobility quality and to improve safety awareness to reduce falls risk. PT recommends SNF placement at this time.    Follow Up Recommendations SNF;Supervision/Assistance - 24 hour    Equipment Recommendations   (mechanical lift and hospital bed if D/C home)    Recommendations for Other Services       Precautions / Restrictions Precautions Precautions: Fall Restrictions Weight Bearing Restrictions: No      Mobility  Bed Mobility Overal bed mobility: Needs Assistance Bed Mobility: Supine to Sit;Sit to Supine     Supine to sit: Mod assist;HOB elevated Sit to supine: Min assist   General bed mobility comments: 2x for both as pt returns to bed and then  expreses urge to have a bowel movement    Transfers Overall transfer level: Needs assistance Equipment used: 1 person hand held assist;Rolling walker (2 wheeled) Transfers: Sit to/from [facility] Sit to Stand: Mod assist Stand pivot transfers: Mod assist;Max assist (modA with PT assist, maxA with RW as pt attempting to sit prior to reaching edge of bed)       General transfer comment: x4 sit to stands, x2 SPT  Ambulation/Gait                Stairs            Wheelchair Mobility    Modified Rankin (Stroke Patients Only)       Balance Overall balance assessment: Needs assistance Sitting-balance support: Single extremity supported;No upper extremity supported;Feet supported Sitting balance-Leahy Scale: Fair     Standing balance support: Bilateral upper extremity supported Standing balance-Leahy Scale: Poor Standing balance comment: min-modA with BUE support of PT or RW                             Pertinent Vitals/Pain Pain Assessment: Faces Faces Pain Scale: Hurts little more Pain Location: LLE (pinched on bedside commode) Pain Descriptors / Indicators: Sharp Pain Intervention(s): Monitored during session    Home Living Family/patient expects to be discharged to:: Private residence Living Arrangements: Alone;Other relatives (history obtained from previous admission 2/2 AMS) Available Help at Discharge: Available PRN/intermittently Type of Home: House Home Access: Stairs to enter Entrance Stairs-Rails: Can reach both Entrance Stairs-Number of Steps: 2 Home Layout: One level Home Equipment: Walker - 2 wheels;Cane - single point;Wheelchair - manual  Prior Function Level of Independence: Needs assistance   Gait / Transfers Assistance Needed: per chart review of previous admission and this admission the pt utilized a manual wheelchair for most mobility at home. RN today reports the pt requried assistance for transfers and  wheelchair mobility but no family is able to corroborate this. Previous admission states the pt was mobilizing independently at home with use of wheelchair           Hand Dominance   Dominant Hand: Right    Extremity/Trunk Assessment   Upper Extremity Assessment Upper Extremity Assessment: Generalized weakness    Lower Extremity Assessment Lower Extremity Assessment: Generalized weakness    Cervical / Trunk Assessment Cervical / Trunk Assessment: Kyphotic  Communication   Communication: No difficulties;Other (comment) (tangential)  Cognition Arousal/Alertness: Awake/alert Behavior During Therapy: Impulsive Overall Cognitive Status: Impaired/Different from baseline Area of Impairment: Orientation;Attention;Memory;Following commands;Safety/judgement;Problem solving;Awareness                 Orientation Level: Disoriented to;Place;Time;Situation (orientation waxes and wanes. Pt sometimes reports he is in [place] but other times reports he is in [place]. Pt also often reports that his house is within the hospital.) Current Attention Level: Focused Memory: Decreased recall of precautions;Decreased short-term memory Following Commands: Follows one step commands with increased time;Follows multi-step commands inconsistently Safety/Judgement: Decreased awareness of safety;Decreased awareness of deficits Awareness: Intellectual Problem Solving: Slow processing;Decreased initiation;Difficulty sequencing;Requires verbal cues;Requires tactile cues        General Comments General comments (skin integrity, edema, etc.): pt tachy into 130s with mobility, recovers quickly once in supine. Pt on 2L [place] upon PT arrival however canula not in nose. PT removes canula for session with pt sats stable in mid90s. PT informs RN, RN requesting pt be left on 2L [place] due to desaturation when sleeping. PT replaces 2L [place]. Pt incontinent of stool twice during this session    Exercises      Assessment/Plan    PT Assessment Patient needs continued PT services  PT Problem List Decreased strength;Decreased activity tolerance;Decreased balance;Decreased mobility;Decreased cognition;Decreased knowledge of use of DME;Decreased safety awareness;Decreased knowledge of precautions;Cardiopulmonary status limiting activity       PT Treatment Interventions DME instruction;Gait training;Therapeutic activities;Functional mobility training;Therapeutic exercise;Balance training;Neuromuscular re-education;Cognitive remediation;Patient/family education;Wheelchair mobility training    PT Goals (Current goals can be found in the Care Plan section)  Acute Rehab PT Goals Patient Stated Goal: to go home and put on a tshirt PT Goal Formulation: With patient Time For Goal Achievement: 09/13/20 Potential to Achieve Goals: Fair Additional Goals Additional Goal #1: Pt will mobilize in a manual wheelchair for 50' with use of BUEs at a supervision level    Frequency Min 2X/week   Barriers to discharge        Co-evaluation               AM-PAC PT "6 Clicks" Mobility  Outcome Measure Help needed turning from your back to your side while in a flat bed without using bedrails?: A Little Help needed moving from lying on your back to sitting on the side of a flat bed without using bedrails?: A Little Help needed moving to and from a bed to a chair (including a wheelchair)?: A Lot Help needed standing up from a chair using your arms (e.g., wheelchair or bedside chair)?: A Lot Help needed to walk in hospital room?: A Lot Help needed climbing 3-5 steps with a railing? : Total 6 Click Score: 13    End  of Session Equipment Utilized During Treatment: Oxygen Activity Tolerance: Patient tolerated treatment well Patient left: in bed;with call bell/phone within reach;with bed alarm set;with nursing/sitter in room Nurse Communication: Mobility status PT Visit Diagnosis: Unsteadiness on feet  (R26.81);Other abnormalities of gait and mobility (R26.89);Muscle weakness (generalized) (M62.81)    Time: 0981-1914 PT Time Calculation (min) (ACUTE ONLY): 42 min   Charges:   PT Evaluation $PT Eval Moderate Complexity: 1 Mod PT Treatments $Therapeutic Activity: 23-37 mins        Zenaida Niece, PT, DPT Acute Rehabilitation Pager: [phone]   Zenaida Niece 08/30/2020, 2:48 PM

## 2020-08-30 NOTE — Progress Notes (Signed)
Called by pt's RN to NT suction patient.  On assessment, pt's breath sounds were clear and diminished.  Pt's mouth was caked with dried secretions.  Attempted to do mouth care and patient was combative.  Reported this to RN.

## 2020-08-30 NOTE — Progress Notes (Addendum)
Paged dr Sloan Leiter to advise that patient has refused anything PO today, in case he wanted to change medication route----he is ok with leaving meds as is, just make sure zosyn is given and he will order PRN metoprolol for heart rate management

## 2020-08-30 NOTE — Progress Notes (Signed)
[place] for Infectious Disease   Reason for visit: Follow up on osteomyelitis  Interval History: no acute events.  WBC wnl, remains afebrile.   Day 7 piperacillin/tazobactam  Physical Exam: Constitutional:  Vitals:   08/30/20 0802 08/30/20 1133  BP: (!) 160/81   Pulse: (!) 105   Resp: 17   Temp: 98 F (36.7 C) 98.1 F (36.7 C)  SpO2: 100%   he appears in nad Respiratory: normal respiratory effort, CTA B Cardiovascular: RRR Skin: no rashes  Review of Systems: GI: Negative for diarrhea Skin: no rashes  Lab Results  Component Value Date   WBC 6.3 08/30/2020   HGB 9.4 (L) 08/30/2020   HCT 29.3 (L) 08/30/2020   MCV 91.3 08/30/2020   PLT 222 08/30/2020    Lab Results  Component Value Date   CREATININE 4.40 (H) 08/29/2020   BUN 77 (H) 08/29/2020   NA 151 (H) 08/29/2020   K 3.3 (L) 08/29/2020   CL 114 (H) 08/29/2020   CO2 23 08/29/2020    Lab Results  Component Value Date   ALT 13 08/25/2020   AST 19 08/25/2020   ALKPHOS 83 08/25/2020     Microbiology: Recent Results (from the past 240 hour(s))  SARS CORONAVIRUS 2 (TAT 6-24 HRS) Nasopharyngeal Nasopharyngeal Swab     Status: None   Collection Time: 08/22/20 11:57 PM   Specimen: Nasopharyngeal Swab  Result Value Ref Range Status   SARS Coronavirus 2 NEGATIVE NEGATIVE Final    Comment: (NOTE) SARS-CoV-2 target nucleic acids are NOT DETECTED.  The SARS-CoV-2 RNA is generally detectable in upper and lower respiratory specimens during the acute phase of infection. Negative results do not preclude SARS-CoV-2 infection, do not rule out co-infections with other pathogens, and should not be used as the sole basis for treatment or other patient management decisions. Negative results must be combined with clinical observations, patient history, and epidemiological information. The expected result is Negative.  Fact Sheet for Patients: SugarRoll.be  Fact Sheet for  Healthcare Providers: https://www.woods-mathews.com/  This test is not yet approved or cleared by the [place] FDA and  has been authorized for detection and/or diagnosis of SARS-CoV-2 by FDA under an Emergency Use Authorization (EUA). This EUA will remain  in effect (meaning this test can be used) for the duration of the COVID-19 declaration under Se ction 564(b)(1) of the Act, 21 U.S.C. section 360bbb-3(b)(1), unless the authorization is terminated or revoked sooner.  Performed at [facility], [place] [address], [place], [place] [address]   MRSA PCR Screening     Status: None   Collection Time: 08/23/20  9:30 PM   Specimen: Nasal Mucosa; Nasopharyngeal  Result Value Ref Range Status   MRSA by PCR NEGATIVE NEGATIVE Final    Comment:        The GeneXpert MRSA Assay (FDA approved for NASAL specimens only), is one component of a comprehensive MRSA colonization surveillance program. It is not intended to diagnose MRSA infection nor to guide or monitor treatment for MRSA infections. Performed at [facility], [place] [address] [place],  [address]   Culture, Urine     Status: Abnormal   Collection Time: 08/24/20 12:01 AM   Specimen: Urine, Random  Result Value Ref Range Status   Specimen Description URINE, RANDOM  Final   Special Requests   Final    NONE Performed at [facility], [place] [address] [place],  [address]    Culture (A)  Final    50,000 COLONIES/mL VANCOMYCIN RESISTANT ENTEROCOCCUS   Report Status 08/29/2020 FINAL  Final   Organism ID, Bacteria VANCOMYCIN RESISTANT ENTEROCOCCUS (A)  Final      Susceptibility   Vancomycin resistant enterococcus - MIC*    AMPICILLIN >=32 RESISTANT Resistant     NITROFURANTOIN 128 RESISTANT Resistant     VANCOMYCIN >=32 RESISTANT Resistant     LINEZOLID 2 SENSITIVE Sensitive     * 50,000 COLONIES/mL VANCOMYCIN RESISTANT ENTEROCOCCUS    Impression/Plan:  1.  Right shoulder septic  arthritis - stable, no new concerns.  He should continue with Piperacillin/tazobactam through January 28th  2.   Left shoulder septic arthritis - no new issues.  On antibiotics as above.   3.  Renal failure - not clear if he will need to continue with IHD.  If he does, antibiotics can be changed to ceftazidime with dialysis, otherwise he already has a line in place and can continue with Zosyn if he does not continue dialysis at discharge.  Follow up arranged with Dr. [name] on January 26th  I will sign off, call with questions

## 2020-08-30 NOTE — Progress Notes (Signed)
Nephrology Follow-Up Consult Note   Assessment/Recommendations: [name] is a/an 75 y.o. male with a past medical history significant for HFrEF, DM 2, neuropathy, PAD, CAD, chronic osteomyelitis who presents with AKI and AMS  AKI, non-oliguric: Unclear cause at this time. Renal ultrasound without hydronephrosis but enlarged kidneys unclear cause.  Most likely NSAID use and Entresto contributed to AKI.  Possible intermittent hypotension. AIN in differential -given persistent AMS (which may very well be from uremia), started IHD on 1/11 (slow start protocol). #2 treatment 1/14. His symptoms now seem to be more so icu delirium/sundowning. Monitor for now, giving his kidneys a chance now to recover. Need to ensure he stays hydrated -recommend maintaining his temp dialysis catheter for now just in case he does require another treatment (hopefully not) -Renally dose meds -Continue to monitor daily Cr, Dose meds for GFR -Maintain MAP>65 for optimal renal perfusion.  -Avoid nephrotoxic medications including NSAIDs and iodinated intravenous contrast exposure unless the latter is absolutely indicated.  Preferred narcotic agents for pain control are hydromorphone, fentanyl, and methadone. Morphine should not be used. Avoid Baclofen and avoid oral sodium phosphate and magnesium citrate based laxatives / bowel preps. Continue strict Input and Output monitoring. Will monitor the patient closely with you and intervene or adjust therapy as indicated by changes in clinical status/labs    Altered mental status     -Likely multifactorial initially: uremia with gabapentin and cefepime contributing. -Hold further gabapentin and benzodiazepine -cefepime switched to zosyn -improved with HD and with #2 treatment on 1/14 -now, I think this is more so delirium/sundowning. Definitely not uremia  Hypernatremia -secondary to decreased free water intake. Free water deficit ~1.4L today -1/2NS today: 2L total at  [id number]/XL  Metabolic acidosis: Likely associated with AKI. Stable.   C/w nahco3 1300mg  tid fir biw, co2 better  Osteomyelitis: of shoulders.    Per ID and ortho  CHF/HTN: Reduced ejection fraction.  Can continue Imdur and carvedilol.  Hold Entresto for now.  clinically dry on exam   [facility] 08/30/2020 12:11 PM  ___________________________________________________________  CC: AKI  Interval History/Subjective:  Mentation worsened again. Saw him after HD yesterday and was signifcantly better. He's actually aaox3 for me but is agitated and impulsive, not following commands (needed mitts).   Medications:  Current Facility-Administered Medications  Medication Dose Route Frequency Provider Last Rate Last Admin  . acetaminophen (TYLENOL) tablet 1,000 mg  1,000 mg Oral Q6H PRN Neena Rhymes, MD   1,000 mg at 08/23/20 0931  . albuterol (VENTOLIN HFA) 108 (90 Base) MCG/ACT inhaler 2 puff  2 puff Inhalation Q6H PRN Norins, Heinz Knuckles, MD      . ARIPiprazole (ABILIFY) tablet 5 mg  5 mg Oral Daily Norins, Heinz Knuckles, MD   5 mg at 08/30/20 1004  . aspirin EC tablet 81 mg  81 mg Oral Daily Darliss Cheney, MD   81 mg at 08/30/20 1003  . buPROPion (WELLBUTRIN XL) 24 hr tablet 150 mg  150 mg Oral Daily Darliss Cheney, MD   150 mg at 08/30/20 1004  . carvedilol (COREG) tablet 6.25 mg  6.25 mg Oral BID WC Ghimire, Dante Gang, MD      . chlorhexidine (PERIDEX) 0.12 % solution 15 mL  15 mL Mouth Rinse BID Darliss Cheney, MD   15 mL at 08/30/20 1003  . Chlorhexidine Gluconate Cloth 2 % PADS 6 each  6 each Topical Daily Darliss Cheney, MD   6 each at 08/30/20 1009  .  clonazePAM (KLONOPIN) disintegrating tablet 0.25 mg  0.25 mg Oral BID PRN Barb Merino, MD      . ferrous sulfate tablet 325 mg  325 mg Oral Daily Norins, Heinz Knuckles, MD   325 mg at 08/30/20 1004  . gabapentin (NEURONTIN) capsule 100 mg  100 mg Oral BID Barb Merino, MD   100 mg at 08/30/20 1004  . heparin  injection 5,000 Units  5,000 Units Subcutaneous Q8H Pahwani, Einar Grad, MD   5,000 Units at 08/30/20 0531  . hydrALAZINE (APRESOLINE) injection 10 mg  10 mg Intravenous Q6H PRN Darliss Cheney, MD      . insulin aspart (novoLOG) injection 0-9 Units  0-9 Units Subcutaneous TID WC Darliss Cheney, MD   3 Units at 08/29/20 1706  . isosorbide mononitrate (IMDUR) 24 hr tablet 60 mg  60 mg Oral Daily Norins, Heinz Knuckles, MD   60 mg at 08/30/20 1003  . lidocaine (XYLOCAINE) 1 % (with pres) injection    PRN Markus Daft, MD   10 mL at 08/25/20 1632  . MEDLINE mouth rinse  15 mL Mouth Rinse q12n4p Pahwani, Ravi, MD   15 mL at 08/29/20 1436  . nitroGLYCERIN (NITROSTAT) SL tablet 0.4 mg  0.4 mg Sublingual Q5 min PRN Norins, Heinz Knuckles, MD      . pantoprazole (PROTONIX) injection 40 mg  40 mg Intravenous Q12H Darliss Cheney, MD   40 mg at 08/30/20 1003  . piperacillin-tazobactam (ZOSYN) IVPB 2.25 g  2.25 g Intravenous Q8H Pahwani, Einar Grad, MD 100 mL/hr at 08/30/20 0531 2.25 g at 08/30/20 0531  . Resource ThickenUp Clear   Oral PRN Darliss Cheney, MD      . rOPINIRole (REQUIP) tablet 1 mg  1 mg Oral BID Norins, Heinz Knuckles, MD   1 mg at 08/30/20 1004  . senna (SENOKOT) tablet 8.6 mg  1 tablet Oral BID Darliss Cheney, MD   8.6 mg at 08/29/20 2246  . sodium bicarbonate tablet 1,300 mg  1,300 mg Oral TID Darliss Cheney, MD   1,300 mg at 08/30/20 1003  . sodium chloride flush (NS) 0.9 % injection 10-40 mL  10-40 mL Intracatheter PRN Darliss Cheney, MD   10 mL at 08/29/20 2246  . tamsulosin (FLOMAX) capsule 0.4 mg  0.4 mg Oral BID Norins, Heinz Knuckles, MD   0.4 mg at 08/30/20 1003  . ticagrelor (BRILINTA) tablet 90 mg  90 mg Oral BID Darliss Cheney, MD   90 mg at 08/30/20 1003      Review of Systems: Neg 12-point review of systems  Physical Exam: Vitals:   08/30/20 0802 08/30/20 1133  BP: (!) 160/81   Pulse: (!) 105   Resp: 17   Temp: 98 F (36.7 C) 98.1 F (36.7 C)  SpO2: 100%    Total I/O In: 240 [P.O.:240] Out: -    Intake/Output Summary (Last 24 hours) at 08/30/2020 1211 Last data filed at 08/30/2020 0802 Gross per 24 hour  Intake 480 ml  Output 1200 ml  Net -720 ml   Constitutional: Ill-appearing, lying in bed ENMT: dry mucosal membranes CV: reg rate, no edema bilateral lower extremities Respiratory: normal work of breathing Gastrointestinal: soft, non-tender, no palpable masses or hernias Skin: no visible lesions or rashes, poor skin turgor Neuro: awake, no asterixis, speech slurred, aaox3   Test Results I personally reviewed new and old clinical labs and radiology tests Lab Results  Component Value Date   NA 151 (H) 08/29/2020   K 3.3 (L) 08/29/2020  CL 114 (H) 08/29/2020   CO2 23 08/29/2020   BUN 77 (H) 08/29/2020   CREATININE 4.40 (H) 08/29/2020   GFR 118.86 03/19/2015   GLU 116 12/01/2017   CALCIUM 8.8 (L) 08/29/2020   ALBUMIN 2.5 (L) 08/28/2020   PHOS 5.7 (H) 08/28/2020

## 2020-08-30 NOTE — Progress Notes (Signed)
PROGRESS NOTE    [name]  OZD:[id number] DOB: [birth year] DOA: 08/22/2020 PCP: Patrecia Pour, Christean Grief, MD    Brief Narrative:  75 year old gentleman with history of extensive medical problems, chronic systolic heart failure, type 2 diabetes with neuropathy, peripheral arterial disease and chronic shoulder problems. Admitted 11/24-12/17 for septic arthritis of both shoulders with GBS and Pseudomonas with septic shock, osteomyelitis of the right humerus.  PICC line placed and discharged home on 8 weeks of cefepime until 1/28. 1/6, seen at ID office and readmitted with renal failure and orthopedic consultation. Developed acute uremic encephalopathy, cefepime changed to Zosyn.  Underwent hemodialysis with temporary catheter on 1/11, repeat on 1/14. Orthopedics recommended conservative management.  ID recommended Zosyn until 1/28. 1/15, overnight some confusion and clearing up in the morning.   Assessment & Plan:   Principal Problem:   Septic arthritis of shoulder ([place]) Active Problems:   Hyperlipidemia   Essential hypertension   BPH (benign prostatic hyperplasia)   Hyponatremia   Generalized anxiety disorder   Restless leg syndrome   Chronic systolic congestive heart failure, NYHA class 3 (HCC)   Hyperlipidemia associated with type 2 diabetes mellitus (HCC)   Diabetes mellitus type II, non insulin dependent ([place])   AKI (acute kidney injury) ([place])   Osteomyelitis of right humerus (HCC)   Encephalopathy acute  Septic arthritis of bilateral shoulder/osteomyelitis of the humerus: Multiple procedures and aspirations.  Followed by orthopedics and they recommended antibiotic treatment.  ID on board. Antibiotics with Zosyn until 1/28.  Clinically improving. Urine cultures 1/9 with VRE, 50,000 colonies.  Followed by ID.  May not need to be treated.  Acute renal failure: Normal renal functions on previous admission.  Came into the hospital with creatinine 4.13 which peaked to 5.3.   Multifactorial secondary to sepsis, AIN.  Developed confusion and encephalopathy and underwent hemodialysis 1/11, 1/14.  Followed by nephrology for dialysis need.  Urine output is adequate. Renal functions pending today.  Urine output is 1200 mL last 24 hours.  Chronic hypernatremia: Due to renal failure.  Adjusted with dialysis.  Lab test pending today.  Acute metabolic infective encephalopathy: Suspected secondary to uremia.  Initially suspected secondary to cefepime.  Intermittent dialysis. Mental status fluctuates. CT head 1/9 with no acute findings. Patient does not have any focal deficits. MRI of the brain today to rule out any subacute stroke.  Hypertension: Blood pressures are stable now.  Still tachycardic.  Will increase dose of carvedilol.  Type 2 diabetes: On sliding scale insulin.  He is on Januvia at home.  Chronic systolic heart failure: Last echocardiogram on 07/10/2020 with ejection fraction 50 to 55%.  Euvolemic.  Lasix and Entresto on hold.  Carvedilol resumed.  History of coronary artery disease status post CABG: Patient on dual antiplatelet therapy with aspirin and Brilinta.  He is on carvedilol.  No evidence of acute coronary syndrome.  Acute hypoxemic respiratory failure: Due to fluid overload.  Resolving.  Mostly on room air.  Lungs are clear.  Generalized anxiety disorder: On multiple medications that were hold.  Wellbutrin resume. Will resume gabapentin and renal failure doses. Will resume Klonopin, half dose that he takes at home.  Start working with PT OT.  MRI of the brain today.  Refer to SNF.  Will be able to transfer once mental status improves and no more needing hemodialysis.   DVT prophylaxis: heparin injection 5,000 Units Start: 08/29/20 1400 Place and maintain sequential compression device Start: 08/27/20 2345   Code Status: Full code  Family Communication: None Disposition Plan: Status is: Inpatient  Remains inpatient appropriate  because:Inpatient level of care appropriate due to severity of illness   Dispo: The patient is from: Home              Anticipated d/c is to: Home              Anticipated d/c date is: > 3 days              Patient currently is not medically stable to d/c.         Consultants:   Nephrology  Orthopedics  Infectious disease  Procedures:   Hemodialysis 1/11, 1/14  Antimicrobials:  Anti-infectives (From admission, onward)   Start     Dose/Rate Route Frequency Ordered Stop   08/25/20 2200  piperacillin-tazobactam (ZOSYN) IVPB 2.25 g        2.25 g 100 mL/hr over 30 Minutes Intravenous Every 8 hours 08/25/20 1230     08/24/20 1800  piperacillin-tazobactam (ZOSYN) IVPB 2.25 g  Status:  Discontinued        2.25 g 100 mL/hr over 30 Minutes Intravenous Every 6 hours 08/24/20 1256 08/25/20 1230   08/24/20 1015  piperacillin-tazobactam (ZOSYN) IVPB 3.375 g  Status:  Discontinued        3.375 g 12.5 mL/hr over 240 Minutes Intravenous Every 8 hours 08/24/20 0925 08/24/20 1256   08/23/20 1000  ceFEPIme (MAXIPIME) 2 g in sodium chloride 0.9 % 100 mL IVPB  Status:  Discontinued        2 g 200 mL/hr over 30 Minutes Intravenous Every 24 hours 08/22/20 2355 08/24/20 0848   08/22/20 2245  ceFEPime (MAXIPIME) IVPB  Status:  Discontinued       Note to Pharmacy: Indication:  Septic arthritis/osteomyelitis  First Dose: No Last Day of Therapy:  09/12/2020 Labs - Once weekly:  CBC/D and BMP, Labs - Every other week:  ESR and CRP Method of administration: IV PushPharmacy to consult re: dose adjust with renal failure   2 g Intravenous Every 8 hours 08/22/20 2155 08/22/20 2354         Subjective: Patient seen and examined.  Nursing staff were feeding him breakfast.  Patient himself was mostly lucid.  He was able to tell me everything about his living status, his hospitalization and infection of his right shoulder. Nursing reported overnight he was confused.  He was confused in the morning  that is slowly improving over the morning. Remains afebrile.  Telemetry shows sinus tachycardia.  Objective: Vitals:   08/29/20 2010 08/29/20 2312 08/30/20 0300 08/30/20 0802  BP: 119/66 132/70 (!) 148/82 (!) 160/81  Pulse: (!) 110 (!) 107 100 (!) 108  Resp: 20 15 18    Temp: 98.3 F (36.8 C) 98 F (36.7 C) 98 F (36.7 C)   TempSrc: Oral Oral Oral   SpO2: 100% 100% 100%   Weight:      Height:        Intake/Output Summary (Last 24 hours) at 08/30/2020 2951 Last data filed at 08/30/2020 0300 Gross per 24 hour  Intake 360 ml  Output 1200 ml  Net -840 ml   Filed Weights   08/22/20 2059 08/29/20 0700  Weight: 89.3 kg 81.7 kg    Examination:  General exam: Appears calm and comfortable  Chronically sick looking but not in any distress. Respiratory system: Clear to auscultation. Respiratory effort normal.  No added sounds. He is on 2 L oxygen but adequately saturating 100%. Cardiovascular system: S1 &  S2 heard, RRR. No JVD, murmurs, rubs, gallops or clicks. No pedal edema. He has temporary catheter on the left IJ. Gastrointestinal system: Abdomen is nondistended, soft and nontender. No organomegaly or masses felt. Normal bowel sounds heard. Foley catheter with clear urine. Central nervous system: Alert and oriented x3.  No focal neurological deficits.  Moves all extremities. He does have some confusion and impulsiveness. Extremities: Symmetric 5 x 5 power.  Generalized weakness.     Data Reviewed: I have personally reviewed following labs and imaging studies  CBC: Recent Labs  Lab 08/26/20 0409 08/27/20 0458 08/28/20 0000 08/28/20 0455 08/28/20 1039 08/29/20 0320 08/30/20 0500  WBC 8.8 8.0  --  7.9 7.8 6.7 6.3  NEUTROABS 5.7 5.2  --  5.0  --  4.0 3.6  HGB 10.3* 10.3* 10.7* 10.8* 11.6* 10.2* 9.4*  HCT 32.1* 32.4* 34.2* 34.5* 35.3* 32.9* 29.3*  MCV 89.4 89.8  --  91.3 90.5 91.4 91.3  PLT 289 268  --  307 283 258 381   Basic Metabolic Panel: Recent Labs  Lab  08/24/20 0052 08/25/20 0444 08/26/20 1020 08/27/20 1030 08/28/20 0455 08/28/20 1039 08/29/20 0320  NA 133*   < > 146* 149* 152* 152* 151*  K 4.6   < > 3.7 3.5 3.3* 3.5 3.3*  CL 105   < > 110 112* 113* 114* 114*  CO2 13*   < > 15* 19* 21* 20* 23  GLUCOSE 101*   < > 136* 124* 117* 181* 155*  BUN 89*   < > 95* 82* 83* 83* 77*  CREATININE 4.55*   < > 5.30* 4.68* 4.71* 4.61* 4.40*  CALCIUM 8.8*   < > 8.8* 8.7* 8.9 8.8* 8.8*  MG 2.5*  --   --   --   --   --   --   PHOS  --   --   --   --   --  5.7*  --    < > = values in this interval not displayed.   GFR: Estimated Creatinine Clearance: 16.2 mL/min (A) (by C-G formula based on SCr of 4.4 mg/dL (H)). Liver Function Tests: Recent Labs  Lab 08/24/20 0052 08/25/20 0444 08/28/20 1039  AST 15 19  --   ALT 13 13  --   ALKPHOS 100 83  --   BILITOT 0.7 1.0  --   PROT 6.9 6.6  --   ALBUMIN 2.3* 2.2* 2.5*   No results for input(s): LIPASE, AMYLASE in the last 168 hours. No results for input(s): AMMONIA in the last 168 hours. Coagulation Profile: No results for input(s): INR, PROTIME in the last 168 hours. Cardiac Enzymes: No results for input(s): CKTOTAL, CKMB, CKMBINDEX, TROPONINI in the last 168 hours. BNP (last 3 results) No results for input(s): PROBNP in the last 8760 hours. HbA1C: No results for input(s): HGBA1C in the last 72 hours. CBG: Recent Labs  Lab 08/29/20 0621 08/29/20 1101 08/29/20 1627 08/29/20 2144 08/30/20 0607  GLUCAP 124* 150* 225* 169* 118*   Lipid Profile: No results for input(s): CHOL, HDL, LDLCALC, TRIG, CHOLHDL, LDLDIRECT in the last 72 hours. Thyroid Function Tests: No results for input(s): TSH, T4TOTAL, FREET4, T3FREE, THYROIDAB in the last 72 hours. Anemia Panel: No results for input(s): VITAMINB12, FOLATE, FERRITIN, TIBC, IRON, RETICCTPCT in the last 72 hours. Sepsis Labs: Recent Labs  Lab 08/23/20 2247 08/24/20 0052  LATICACIDVEN 0.7 0.7    Recent Results (from the past 240 hour(s))   SARS CORONAVIRUS 2 (TAT  6-24 HRS) Nasopharyngeal Nasopharyngeal Swab     Status: None   Collection Time: 08/22/20 11:57 PM   Specimen: Nasopharyngeal Swab  Result Value Ref Range Status   SARS Coronavirus 2 NEGATIVE NEGATIVE Final    Comment: (NOTE) SARS-CoV-2 target nucleic acids are NOT DETECTED.  The SARS-CoV-2 RNA is generally detectable in upper and lower respiratory specimens during the acute phase of infection. Negative results do not preclude SARS-CoV-2 infection, do not rule out co-infections with other pathogens, and should not be used as the sole basis for treatment or other patient management decisions. Negative results must be combined with clinical observations, patient history, and epidemiological information. The expected result is Negative.  Fact Sheet for Patients: SugarRoll.be  Fact Sheet for Healthcare Providers: https://www.woods-mathews.com/  This test is not yet approved or cleared by the [place] FDA and  has been authorized for detection and/or diagnosis of SARS-CoV-2 by FDA under an Emergency Use Authorization (EUA). This EUA will remain  in effect (meaning this test can be used) for the duration of the COVID-19 declaration under Se ction 564(b)(1) of the Act, 21 U.S.C. section 360bbb-3(b)(1), unless the authorization is terminated or revoked sooner.  Performed at [facility], [place] [address] [place], [place] [address]   MRSA PCR Screening     Status: None   Collection Time: 08/23/20  9:30 PM   Specimen: Nasal Mucosa; Nasopharyngeal  Result Value Ref Range Status   MRSA by PCR NEGATIVE NEGATIVE Final    Comment:        The GeneXpert MRSA Assay (FDA approved for NASAL specimens only), is one component of a comprehensive MRSA colonization surveillance program. It is not intended to diagnose MRSA infection nor to guide or monitor treatment for MRSA infections. Performed at [facility], [place] [address] [place], [place] [address]   Culture, Urine     Status: Abnormal   Collection Time: 08/24/20 12:01 AM   Specimen: Urine, Random  Result Value Ref Range Status   Specimen Description URINE, RANDOM  Final   Special Requests   Final    NONE Performed at [facility], [place] [address], [place], [place] [address]    Culture (A)  Final    50,000 COLONIES/mL VANCOMYCIN RESISTANT ENTEROCOCCUS   Report Status 08/29/2020 FINAL  Final   Organism ID, Bacteria VANCOMYCIN RESISTANT ENTEROCOCCUS (A)  Final      Susceptibility   Vancomycin resistant enterococcus - MIC*    AMPICILLIN >=32 RESISTANT Resistant     NITROFURANTOIN 128 RESISTANT Resistant     VANCOMYCIN >=32 RESISTANT Resistant     LINEZOLID 2 SENSITIVE Sensitive     * 50,000 COLONIES/mL VANCOMYCIN RESISTANT ENTEROCOCCUS         Radiology Studies: DG Swallowing Func-Speech Pathology  Result Date: 08/28/2020 Objective Swallowing Evaluation: Type of Study: MBS-Modified Barium Swallow Study  Patient Details Name: Curvin Hunger MRN: [id number] Date of Birth: 22-Mar-1946 Today's Date: 08/28/2020 Time: SLP Start Time (ACUTE ONLY): 1300 -SLP Stop Time (ACUTE ONLY): 1319 SLP Time Calculation (min) (ACUTE ONLY): 19 min Past Medical History: Past Medical History: Diagnosis Date . Acute renal failure (ARF) ([place]) 08/21/2020 . Anemia   NOS . Anxiety  . Blindness of left eye   near blindness. 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 BPH (benign prostatic hypertrophy)  . Broken foot Oct. 12, 2013  Right foot Fx . CAD (coronary artery disease)  . Carotid stenosis, left  . Cellulitis and abscess of leg 03/2018  right leg . Chronic hyponatremia  . Chronic pain syndrome  . COPD (chronic obstructive pulmonary disease) ([place])  . Depression  . Diabetes mellitus  . Diabetic foot ulcer ([place])  . DJD (degenerative joint disease)   L wrist . DM2 (diabetes mellitus, type 2) ([place])  . GERD (gastroesophageal reflux disease)  . HLD (hyperlipidemia)  . HTN (hypertension)  . Hx of  blood transfusion reaction  . Hyponatremia  . Lumbar disc disease  . MI (mitral incompetence)  . Myocardial infarction ([place])   x 2 . Osteoporosis   tx per [facility], Dr Prudencio Burly, thought due to heavy steriod use after 1978 . Peripheral neuropathy  . Peripheral vascular disease ([place])  . Restless leg syndrome  . Spine fracture   hx, multiple . Varicose veins  . 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 Vitamin D deficiency  Past Surgical History: Past Surgical History: Procedure Laterality Date . ANGIOPLASTY   . aorta bifemoral bypass grafting  09/2010 . CARDIAC CATHETERIZATION N/A 09/19/2015  Procedure: Left Heart Cath and Coronary Angiography;  Surgeon: Adrian Prows, MD;  Location: [facility];  Service: Cardiovascular;  Laterality: N/A; . CARPAL TUNNEL RELEASE    right 2006/ left 2007 . CATARACT EXTRACTION    left 1996/ right 1997 . CORONARY ARTERY BYPASS GRAFT N/A 09/22/2015  Procedure: CORONARY ARTERY BYPASS GRAFTING (CABG) times four using the right greater saphenous vein harvested endoscopically and the left internal mammary artery.  LIMA-LAD, SEQ SVG-DIAG & OM, SVG-PD.;  Surgeon: [name], MD;  Location: [facility];  Service: Open Heart Surgery;  Laterality: N/A; . I & D EXTREMITY Right 04/07/2018  Procedure: IRRIGATION AND DEBRIDEMENT ABSCESS RIGHT LEG;  Surgeon: Newt Minion, MD;  Location: [facility];  Service: Orthopedics;  Laterality: Right; . INCISION AND DRAINAGE OF WOUND Right 05/07/2020  Procedure: RIGHT LONG FINGER IRRIGATION AND DEBRIDEMENT AND AMPUTATION;  Surgeon: Iran Planas, MD;  Location: [facility];  Service: Orthopedics;  Laterality: Right;  with IV sedation . INCISION AND DRAINAGE OF WOUND Right 05/12/2020  Procedure: IRRIGATION AND DEBRIDEMENT WOUND and possible revision amputation;  Surgeon: Iran Planas, MD;  Location: [facility];  Service: Orthopedics;  Laterality: Right;  needs 60 minutes . IR FLUORO GUIDE CV LINE LEFT  08/25/2020 . IR FLUORO GUIDE CV LINE RIGHT  07/18/2020 . IR US GUIDE VASC  ACCESS LEFT  08/25/2020 . IR US GUIDE VASC ACCESS RIGHT  07/18/2020 . L foot open repair jones fracture  2010   5th metetarsal  . left ankle ganglion cyst  1976 . left carotid endarterectomy  03/2011 . left cataract  1996  right - 1997 . left CTS  2006  R CTS - 2007 . left foot surgery  1998  R surgery/fracture - 1999 . left plantar ganglion cystectomy  1979 . left wrist/hand fusion  2008 . [place], 2004 . NASAL SINUS SURGERY    multiple- x8. last 1997 with obliteration . REPAIR THORACIC AORTA  2017 . right hand fracture  1969 . ROTATOR CUFF REPAIR  2006  R, than repeat 2011, Dr Theda Sers . SHOULDER ARTHROSCOPY Bilateral 07/17/2020  Procedure: Right shoulder arthroscopic lavage; Left shoulder aspiration.;  Surgeon: Justice Britain, MD;  Location: [facility];  Service: Orthopedics;  Laterality: Bilateral;  134mn . SHOULDER ARTHROSCOPY WITH LABRAL REPAIR Left 07/10/2020  Procedure: SHOULDER ARTHROSCOPY WITH WASHOUT;  Surgeon: SJustice Britain MD;  Location: [facility];  Service: Orthopedics;  Laterality: Left; . TRIGGER FINGER RELEASE Left 11/11/2014  Procedure: LEFT LONG FINGER RELEASE TRIGGER FINGER/A-1 PULLEY;  Surgeon: Milly Jakob, MD;  Location: [facility];  Service: Orthopedics;  Laterality: Left; . vocal surgery  1996 HPI: Pt is a 75 y.o. male presenting with septic arthritis of shoulder and AMS. He has a history of chronic systold heart failure, DM with completication of neuropathy, PAD, ASVD diffuse, and chronic shoulder problems. Pt also has a history of GERD, blindness of left eye, COPD, and a left carotid endarterectomy. He also had vocal fold surgery in 1996. 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 Pt did not follow up for further treatment.  Subjective: Pt was agreeable and alert Assessment / Plan / Recommendation CHL IP CLINICAL IMPRESSIONS 08/28/2020 Clinical Impression Pt presents with oropharyngeal dysphagia  characterized by reduced bolus cohesion, a pharyngeal delay, reduced lingual retraction, and reduced anterior laryngeal movement. He demonstrated premature spillage to the valleculae and pyriform sinuses, mild-moderate vallecular residue, and pyriform sinus residue. Amount of residue increased with bolus size and penetration (PAS 3) of pyriform sinus residue was intermittently noted on secondary swallows of full-tsp boluses of solids. Pt inconsistently reported that he swallowed solid boluses once they had passed the posterior faucial arches and required additional boluses to be provided for a swallow to be triggered. The swallow was often triggered with >50% of the liquid bolus at the level of the pyriform sinuses and this resulted in aspiration (PAS 7) of thin and nectar thick liquids with delayed laryngeal sensation. Pt's cough was weak and propelled the aspirate above the vocal folds, but did not expel it from the larynx and it was subsequently aspirated. Postural modifications (i.e., chin tuck and L/R head turn) and use of effortful swallows were ineffective in eliminating laryngeal invasion, but no instances of penetration/aspiration were noted with honey thick liquids. A dysphagia 2 diet with honey thick liquids is recommended at this time with strict observance of swallowing precautions and full supervision to ensure observance. SLP will follow for dysphagia treatment. SLP Visit Diagnosis Dysphagia, oropharyngeal phase (R13.12) Attention and concentration deficit following -- Frontal lobe and executive function deficit following -- Impact on safety and function Moderate aspiration risk   CHL IP TREATMENT RECOMMENDATION 08/28/2020 Treatment Recommendations Therapy as outlined in treatment plan below   Prognosis 08/28/2020 Prognosis for Safe Diet Advancement Fair Barriers to Reach Goals Cognitive deficits;Time post onset Barriers/Prognosis Comment -- CHL IP DIET RECOMMENDATION 08/28/2020 SLP Diet Recommendations  Dysphagia 2 (Fine chop) solids;Honey thick liquids Liquid Administration via Cup;Straw Medication Administration Crushed with puree Compensations Slow rate;Small sips/bites;Follow solids with liquid;Minimize environmental distractions;Multiple dry swallows after each bite/sip Postural Changes Seated upright at 90 degrees   CHL IP OTHER RECOMMENDATIONS 08/28/2020 Recommended Consults -- Oral Care Recommendations Oral care BID Other Recommendations --   CHL IP FOLLOW UP RECOMMENDATIONS 08/28/2020 Follow up Recommendations (No Data)   CHL IP FREQUENCY AND DURATION 08/28/2020 Speech Therapy Frequency (ACUTE ONLY) min 2x/week Treatment Duration 2 weeks      CHL IP ORAL PHASE 08/28/2020 Oral Phase Impaired Oral - Pudding Teaspoon -- Oral - Pudding Cup -- Oral - Honey Teaspoon -- Oral - Honey Cup Decreased bolus cohesion;Premature spillage Oral - Nectar Teaspoon -- Oral - Nectar Cup Decreased bolus cohesion;Premature spillage Oral - Nectar Straw Decreased bolus cohesion;Premature spillage Oral - Thin Teaspoon Decreased bolus cohesion;Premature spillage Oral - Thin Cup Decreased bolus cohesion;Premature spillage Oral - Thin Straw Decreased bolus cohesion;Premature spillage Oral - Puree --  Oral - Mech Soft -- Oral - Regular -- Oral - Multi-Consistency -- Oral - Pill Reduced posterior propulsion Oral Phase - Comment --  CHL IP PHARYNGEAL PHASE 08/28/2020 Pharyngeal Phase Impaired Pharyngeal- Pudding Teaspoon -- Pharyngeal -- Pharyngeal- Pudding Cup -- Pharyngeal -- Pharyngeal- Honey Teaspoon -- Pharyngeal -- Pharyngeal- Honey Cup Reduced tongue base retraction;Reduced anterior laryngeal mobility;Delayed swallow initiation-vallecula;Delayed swallow initiation-pyriform sinuses;Pharyngeal residue - valleculae;Pharyngeal residue - pyriform Pharyngeal -- Pharyngeal- Nectar Teaspoon -- Pharyngeal -- Pharyngeal- Nectar Cup Reduced tongue base retraction;Reduced anterior laryngeal mobility;Delayed swallow initiation-vallecula;Delayed  swallow initiation-pyriform sinuses;Pharyngeal residue - valleculae;Pharyngeal residue - pyriform;Penetration/Aspiration during swallow;Penetration/Apiration after swallow;Moderate aspiration Pharyngeal Material enters airway, passes BELOW cords and not ejected out despite cough attempt by patient Pharyngeal- Nectar Straw Reduced tongue base retraction;Reduced anterior laryngeal mobility;Delayed swallow initiation-vallecula;Delayed swallow initiation-pyriform sinuses;Pharyngeal residue - valleculae;Pharyngeal residue - pyriform;Penetration/Aspiration during swallow;Penetration/Apiration after swallow;Moderate aspiration Pharyngeal Material enters airway, passes BELOW cords and not ejected out despite cough attempt by patient Pharyngeal- Thin Teaspoon Reduced tongue base retraction;Reduced anterior laryngeal mobility;Delayed swallow initiation-vallecula;Delayed swallow initiation-pyriform sinuses;Pharyngeal residue - valleculae;Pharyngeal residue - pyriform;Penetration/Aspiration during swallow;Penetration/Apiration after swallow;Moderate aspiration Pharyngeal Material enters airway, passes BELOW cords and not ejected out despite cough attempt by patient;Material enters airway, remains ABOVE vocal cords and not ejected out Pharyngeal- Thin Cup Reduced tongue base retraction;Reduced anterior laryngeal mobility;Delayed swallow initiation-vallecula;Delayed swallow initiation-pyriform sinuses;Pharyngeal residue - valleculae;Pharyngeal residue - pyriform;Penetration/Aspiration during swallow;Penetration/Apiration after swallow;Moderate aspiration Pharyngeal Material enters airway, passes BELOW cords and not ejected out despite cough attempt by patient Pharyngeal- Thin Straw Reduced tongue base retraction;Reduced anterior laryngeal mobility;Delayed swallow initiation-vallecula;Delayed swallow initiation-pyriform sinuses;Pharyngeal residue - valleculae;Pharyngeal residue - pyriform;Penetration/Aspiration during  swallow;Penetration/Apiration after swallow;Moderate aspiration Pharyngeal Material enters airway, passes BELOW cords and not ejected out despite cough attempt by patient Pharyngeal- Puree Reduced tongue base retraction;Reduced anterior laryngeal mobility;Delayed swallow initiation-vallecula;Delayed swallow initiation-pyriform sinuses;Pharyngeal residue - valleculae;Pharyngeal residue - pyriform;Penetration/Aspiration during swallow;Penetration/Apiration after swallow;Moderate aspiration Pharyngeal Material enters airway, remains ABOVE vocal cords and not ejected out Pharyngeal- Mechanical Soft -- Pharyngeal -- Pharyngeal- Regular Reduced tongue base retraction;Reduced anterior laryngeal mobility;Delayed swallow initiation-vallecula;Delayed swallow initiation-pyriform sinuses;Pharyngeal residue - valleculae;Pharyngeal residue - pyriform;Penetration/Aspiration during swallow;Penetration/Apiration after swallow;Moderate aspiration Pharyngeal Material enters airway, remains ABOVE vocal cords and not ejected out Pharyngeal- Multi-consistency -- Pharyngeal -- Pharyngeal- Pill Reduced tongue base retraction;Reduced anterior laryngeal mobility;Delayed swallow initiation-vallecula;Delayed swallow initiation-pyriform sinuses;Pharyngeal residue - valleculae;Pharyngeal residue - pyriform;Penetration/Aspiration during swallow;Penetration/Apiration after swallow;Moderate aspiration Pharyngeal -- Pharyngeal Comment --  CHL IP CERVICAL ESOPHAGEAL PHASE 08/28/2020 Cervical Esophageal Phase WFL Pudding Teaspoon -- Pudding Cup -- Honey Teaspoon -- Honey Cup -- Nectar Teaspoon -- Nectar Cup -- Nectar Straw -- Thin Teaspoon -- Thin Cup -- Thin Straw -- Puree -- Mechanical Soft -- Regular -- Multi-consistency -- Pill -- Cervical Esophageal Comment -- Shanika I. Hardin Negus, [place], [place] Office number [phone] Pager [phone] Horton Marshall 08/28/2020, 2:08 PM                   Scheduled Meds: .  ARIPiprazole  5 mg Oral Daily  . aspirin EC  81 mg Oral Daily  . buPROPion  150 mg Oral Daily  . carvedilol  6.25 mg Oral BID WC  . chlorhexidine  15 mL Mouth Rinse BID  . Chlorhexidine Gluconate Cloth  6 each Topical Daily  . ferrous sulfate  325 mg Oral Daily  . gabapentin  100 mg Oral QHS  . heparin injection (subcutaneous)  5,000 Units Subcutaneous Q8H  . insulin aspart  0-9 Units Subcutaneous  TID WC  . isosorbide mononitrate  60 mg Oral Daily  . mouth rinse  15 mL Mouth Rinse q12n4p  . pantoprazole (PROTONIX) IV  40 mg Intravenous Q12H  . rOPINIRole  1 mg Oral BID  . senna  1 tablet Oral BID  . sodium bicarbonate  1,300 mg Oral TID  . tamsulosin  0.4 mg Oral BID  . ticagrelor  90 mg Oral BID   Continuous Infusions: . piperacillin-tazobactam (ZOSYN)  IV 2.25 g (08/30/20 0531)     LOS: 8 days    Time spent: 35 minutes    Barb Merino, MD [facility] Pager [phone]

## 2020-08-30 NOTE — Progress Notes (Signed)
  Speech Language Pathology Treatment: Dysphagia  Patient Details Name: [name] MRN: [id number] DOB: [birth year] Today's Date: 08/30/2020 Time: 2094-7096 SLP Time Calculation (min) (ACUTE ONLY): 8 min  Assessment / Plan / Recommendation Clinical Impression  Pt was seen for skilled ST targeting diet tolerance and diagnostic treatment.  He was encountered awake/alert with bilateral mitts in place and with RN at bedside.  RN reported that pt was tolerating his current diet well, and that she did not observed coughing or choking with breakfast consumption this AM.  Pt was agreeable to only one trial of honey-thick liquid via straw sip on this date.  He exhibited a delayed throat clear x1.  He refused additional PO trials despite being offered multiple drink and food options.  Recommend continuation of Dysphagia 2 (fine chop) solids and honey-thick liquids with medication administered crushed in puree.  Pt will continue to benefit from full supervision and assistance with PO intake. SLP will f/u to monitor diet tolerance and to determine readiness for diet upgrade.     HPI HPI: Pt is a 75 y.o. male presenting with septic arthritis of shoulder and AMS. He has a history of chronic systold heart failure, DM with completication of neuropathy, PAD, ASVD diffuse, and chronic shoulder problems. Pt also has a history of GERD, blindness of left eye, COPD, and a left carotid endarterectomy. He also had vocal fold surgery in 1996. 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 Pt did not follow up for further treatment.      SLP Plan  Continue with current plan of care       Recommendations  Diet recommendations: Honey-thick liquid;Dysphagia 2 (fine chop) Liquids provided via: Cup;Straw Medication Administration: Crushed with puree Supervision: Full supervision/cueing for compensatory strategies;Staff to assist with self  feeding Compensations: Slow rate;Small sips/bites;Follow solids with liquid;Minimize environmental distractions;Multiple dry swallows after each bite/sip Postural Changes and/or Swallow Maneuvers: Seated upright 90 degrees                Oral Care Recommendations: Oral care BID Follow up Recommendations: Skilled Nursing facility SLP Visit Diagnosis: Dysphagia, oropharyngeal phase (R13.12) Plan: Continue with current plan of care       GO               Colin Mulders M.S., [place] Office: [phone]  [place] 08/30/2020, 10:13 AM

## 2020-08-31 ENCOUNTER — Inpatient Hospital Stay (HOSPITAL_COMMUNITY): Payer: Medicare Other

## 2020-08-31 DIAGNOSIS — M86221 Subacute osteomyelitis, right humerus: Secondary | ICD-10-CM | POA: Diagnosis not present

## 2020-08-31 DIAGNOSIS — G9340 Encephalopathy, unspecified: Secondary | ICD-10-CM | POA: Diagnosis not present

## 2020-08-31 DIAGNOSIS — I5022 Chronic systolic (congestive) heart failure: Secondary | ICD-10-CM | POA: Diagnosis not present

## 2020-08-31 DIAGNOSIS — M00819 Arthritis due to other bacteria, unspecified shoulder: Secondary | ICD-10-CM | POA: Diagnosis not present

## 2020-08-31 LAB — CBC WITH DIFFERENTIAL/PLATELET
Abs Immature Granulocytes: 0.03 10*3/uL (ref 0.00–0.07)
Basophils Absolute: 0 10*3/uL (ref 0.0–0.1)
Basophils Relative: 1 %
Eosinophils Absolute: 0.6 10*3/uL — ABNORMAL HIGH (ref 0.0–0.5)
Eosinophils Relative: 11 %
HCT: 26.9 % — ABNORMAL LOW (ref 39.0–52.0)
Hemoglobin: 8.8 g/dL — ABNORMAL LOW (ref 13.0–17.0)
Immature Granulocytes: 1 %
Lymphocytes Relative: 15 %
Lymphs Abs: 0.8 10*3/uL (ref 0.7–4.0)
MCH: 30.2 pg (ref 26.0–34.0)
MCHC: 32.7 g/dL (ref 30.0–36.0)
MCV: 92.4 fL (ref 80.0–100.0)
Monocytes Absolute: 0.8 10*3/uL (ref 0.1–1.0)
Monocytes Relative: 15 %
Neutro Abs: 3.1 10*3/uL (ref 1.7–7.7)
Neutrophils Relative %: 57 %
Platelets: 204 10*3/uL (ref 150–400)
RBC: 2.91 MIL/uL — ABNORMAL LOW (ref 4.22–5.81)
RDW: 18.8 % — ABNORMAL HIGH (ref 11.5–15.5)
WBC: 5.3 10*3/uL (ref 4.0–10.5)
nRBC: 0 % (ref 0.0–0.2)

## 2020-08-31 LAB — BASIC METABOLIC PANEL
Anion gap: 12 (ref 5–15)
BUN: 51 mg/dL — ABNORMAL HIGH (ref 8–23)
CO2: 22 mmol/L (ref 22–32)
Calcium: 7.9 mg/dL — ABNORMAL LOW (ref 8.9–10.3)
Chloride: 110 mmol/L (ref 98–111)
Creatinine, Ser: 3.54 mg/dL — ABNORMAL HIGH (ref 0.61–1.24)
GFR, Estimated: 17 mL/min — ABNORMAL LOW (ref >60)
Glucose, Bld: 136 mg/dL — ABNORMAL HIGH (ref 70–99)
Potassium: 2.9 mmol/L — ABNORMAL LOW (ref 3.5–5.1)
Sodium: 144 mmol/L (ref 135–145)

## 2020-08-31 LAB — GLUCOSE, CAPILLARY
Glucose-Capillary: 130 mg/dL — ABNORMAL HIGH (ref 70–99)
Glucose-Capillary: 189 mg/dL — ABNORMAL HIGH (ref 70–99)
Glucose-Capillary: 172 mg/dL — ABNORMAL HIGH (ref 70–99)
Glucose-Capillary: 188 mg/dL — ABNORMAL HIGH (ref 70–99)

## 2020-08-31 MED ORDER — POTASSIUM CHLORIDE CRYS ER 20 MEQ PO TBCR
40.0000 meq | EXTENDED_RELEASE_TABLET | Freq: Two times a day (BID) | ORAL | Status: DC
  Administered 2020-08-31 – 2020-09-01 (×3): 40 meq via ORAL
  Filled 2020-08-31 (×3): qty 2

## 2020-08-31 MED ORDER — POTASSIUM CHLORIDE 10 MEQ/100ML IV SOLN
10.0000 meq | INTRAVENOUS | Status: AC
  Administered 2020-08-31 (×2): 10 meq via INTRAVENOUS
  Filled 2020-08-31 (×2): qty 100

## 2020-08-31 MED ORDER — KCL IN DEXTROSE-NACL 10-5-0.45 MEQ/L-%-% IV SOLN
INTRAVENOUS | Status: DC
  Filled 2020-08-31 (×2): qty 1000

## 2020-08-31 NOTE — Progress Notes (Signed)
Pharmacy Antibiotic Note  [name] is a 75 y.o. male admitted on 08/22/2020 with Bilateral septic arthritis with Group B streptococcus and Pseudomonas. Pt being followed by ID as outpt and on Cefepime which was continued inpatient (planned 8 wks course with stop date of 1/28). No debridement planned by ortho.  Pt underwent iHD last on 11/14, currently holding off for now as pt is making urine. CrCl ~20 ml/min so will not adjust dose for now.  Plan: Continue Zosyn 2.25g IV q8h F/U renal plans   Height: 6' (182.9 cm) Weight: 81.7 kg (180 lb 1.9 oz) IBW/kg (Calculated) : 77.6  Temp (24hrs), Avg:98.1 F (36.7 C), Min:97.5 F (36.4 C), Max:98.5 F (36.9 C)  Recent Labs  Lab 08/28/20 0455 08/28/20 1039 08/29/20 0320 08/30/20 0500 08/30/20 1410 08/31/20 0315  WBC 7.9 7.8 6.7 6.3  --  5.3  CREATININE 4.71* 4.61* 4.40*  --  3.71* 3.54*    Estimated Creatinine Clearance: 20.1 mL/min (A) (by C-G formula based on SCr of 3.54 mg/dL (H)).    Allergies  Allergen Reactions  . Ivp Dye [Iodinated Diagnostic Agents]     Blood Pressure dropped----pt was pre-medicated with 13 hour prep and did fine with pre-meds--amy 03/09/11   . Sulfa Antibiotics Hives  . Sulfonamide Derivatives Hives and Itching  . Lisinopril Cough  . Metoprolol Nausea And Vomiting  . Statins Other (See Comments)    myalgias  . Propoxyphene N-Acetaminophen Other (See Comments)    Sharp pains- headache    Antimicrobials this admission: Cefepime 12/17 (Started last admission) >> 1/9 Zosyn 1/9 >>   Arrie Senate, PharmD, BCPS, [facility] Clinical Pharmacist [phone] Please check AMION for all [place] numbers 08/31/2020

## 2020-08-31 NOTE — Evaluation (Signed)
Occupational Therapy Evaluation Patient Details Name: [name] MRN: [id number] DOB: 24-Apr-1946 Today's Date: 08/31/2020    History of Present Illness 75 y.o. male with medical history significant of multiple medical problems including chronic systolic heart failure, DM with neuropathy, PAD, ASVD diffuse. He was admitted 07/09/20-08/01/20 for septic arthritis both shoulders with GBS and Pseudomonas with septic shock. He was also found to have osteomyelitis right humerus. Pt admitted on 08/22/2020 after outpatient appointment. Pt developed acute uremic encephalopathy and later underwent HDU during admission which helped improve mental status, HDU held for 2 days and encephalopathy returned.   Clinical Impression   Pt unreliable historian, but per chart review and upon talking with RN Pt typically uses manual WC to navigate home environment and while he performs grooming and self-feeding he has assist for bathing/dressing. Pt is asleep upon entry, slow to wake - positional changes helped. Mod A for bed mobility, min A for seated grooming EOB, total A for LB dressing, and max A for stand and pivot up the bed for better repositioning. OT will continue to follow acutely and at this time recommending SNF post-acute to maximize safety and independence in ADL and functional transfers.     Follow Up Recommendations  SNF    Equipment Recommendations       Recommendations for Other Services       Precautions / Restrictions Precautions Precautions: Fall Restrictions Weight Bearing Restrictions: No      Mobility Bed Mobility Overal bed mobility: Needs Assistance Bed Mobility: Supine to Sit;Sit to Supine     Supine to sit: Mod assist;HOB elevated (assist for sequencing, initiating legs OOB, trunk elevation, use of bed pad to bring hips square) Sit to supine: Mod assist (assist for BLE back into bed, and repositioning in supine)        Transfers Overall transfer level: Needs  assistance Equipment used: 1 person hand held assist Transfers: Sit to/from [facility] Sit to Stand: Max assist Stand pivot transfers: Mod assist       General transfer comment: max A for initial boost into standing, and mod A to pivot hips up bed for respostioning    Balance Overall balance assessment: Needs assistance Sitting-balance support: Single extremity supported;No upper extremity supported;Feet supported Sitting balance-Leahy Scale: Fair Sitting balance - Comments: min A to min guard   Standing balance support: Bilateral upper extremity supported Standing balance-Leahy Scale: Poor Standing balance comment: dependent on BUE in standing                           ADL either performed or assessed with clinical judgement   ADL Overall ADL's : Needs assistance/impaired Eating/Feeding: Moderate assistance;Sitting   Grooming: Wash/dry face;Minimal assistance;Sitting Grooming Details (indicate cue type and reason): EOB, cues to initiate task and for task completion Upper Body Bathing: Moderate assistance   Lower Body Bathing: Maximal assistance   Upper Body Dressing : Moderate assistance   Lower Body Dressing: Maximal assistance   Toilet Transfer: Maximal assistance;Stand-pivot Toilet Transfer Details (indicate cue type and reason): simulated through stand and pivot up the bed Toileting- Clothing Manipulation and Hygiene: Total assistance Toileting - Clothing Manipulation Details (indicate cue type and reason): Pt with condom catheter     Functional mobility during ADLs: Maximal assistance (stand and pivot only) General ADL Comments: decreased cognition greatly impacting ability to perform ADL     Vision         Perception  Praxis      Pertinent Vitals/Pain Pain Assessment: Faces Faces Pain Scale: Hurts a little bit Pain Location: generalized (pinched on bedside commode) Pain Descriptors / Indicators: Discomfort Pain  Intervention(s): Limited activity within patient's tolerance;Monitored during session;Repositioned     Hand Dominance Right   Extremity/Trunk Assessment Upper Extremity Assessment Upper Extremity Assessment: Generalized weakness   Lower Extremity Assessment Lower Extremity Assessment: Generalized weakness   Cervical / Trunk Assessment Cervical / Trunk Assessment: Kyphotic   Communication Communication Communication: No difficulties;Other (comment) (tangential)   Cognition Arousal/Alertness: Lethargic Behavior During Therapy: Flat affect Overall Cognitive Status: Impaired/Different from baseline Area of Impairment: Orientation;Attention;Memory;Following commands;Safety/judgement;Problem solving;Awareness                 Orientation Level: Disoriented to;Place;Time;Situation (orientation waxes and waines) Current Attention Level: Focused Memory: Decreased recall of precautions;Decreased short-term memory Following Commands: Follows one step commands with increased time Safety/Judgement: Decreased awareness of safety;Decreased awareness of deficits Awareness: Intellectual Problem Solving: Slow processing;Decreased initiation;Difficulty sequencing;Requires verbal cues;Requires tactile cues General Comments: Pt overall pleasantly confused, decreased initiation and awareness throughout session   General Comments  HR elevated to 129 with sitting EOB and quick pivot up bed for repositioning, Pt on 2L O2 throughout session    Exercises     Shoulder Instructions      Home Living Family/patient expects to be discharged to:: Private residence Living Arrangements: Alone;Other relatives (history obtained from previous admission 2/2 AMS) Available Help at Discharge: Available PRN/intermittently Type of Home: House Home Access: Stairs to enter [employer] of Steps: 2 Entrance Stairs-Rails: Can reach both Home Layout: One level     Bathroom Shower/Tub: Arts administrator: Standard Bathroom Accessibility: Yes How Accessible: Accessible via walker Home Equipment: [place] - 2 wheels;Cane - single point;Wheelchair - manual   Additional Comments: Pt unreliable historian, home set up taken from previous admission      Prior Functioning/Environment Level of Independence: Needs assistance  Gait / Transfers Assistance Needed: per chart review of previous admission and this admission the pt utilized a manual wheelchair for most mobility at home. RN today reports the pt requried assistance for transfers and wheelchair mobility but no family is able to corroborate this. Previous admission states the pt was mobilizing independently at home with use of wheelchair ADL's / Homemaking Assistance Needed: per RN at bedside, Pt was self-feeding and grooming, but needed assist for bathing/dressing/toilet transfers and peri care            OT Problem List: Decreased strength;Decreased activity tolerance;Impaired balance (sitting and/or standing);Decreased cognition;Decreased safety awareness;Decreased knowledge of use of DME or AE;Cardiopulmonary status limiting activity      OT Treatment/Interventions: Self-care/ADL training;Energy conservation;DME and/or AE instruction;Therapeutic activities;Patient/family education;Balance training    OT Goals(Current goals can be found in the care plan section) Acute Rehab OT Goals Patient Stated Goal: to go to [facility] OT Goal Formulation: With patient Time For Goal Achievement: 09/14/20 Potential to Achieve Goals: Fair ADL Goals Pt Will Perform Eating: with set-up;sitting Pt Will Perform Grooming: with set-up;sitting Pt Will Transfer to Toilet: with min guard assist;squat pivot transfer;bedside commode Pt Will Perform Toileting - Clothing Manipulation and hygiene: with min assist;sitting/lateral leans Additional ADL Goal #1: Pt will perform bed mobility at supervision level prior to engaging in ADL  OT Frequency:  Min 2X/week   Barriers to D/C:            Co-evaluation  AM-PAC OT "6 Clicks" Daily Activity     Outcome Measure Help from another person eating meals?: A Little Help from another person taking care of personal grooming?: A Little Help from another person toileting, which includes using toliet, bedpan, or urinal?: A Lot Help from another person bathing (including washing, rinsing, drying)?: A Lot Help from another person to put on and taking off regular upper body clothing?: A Lot Help from another person to put on and taking off regular lower body clothing?: Total 6 Click Score: 13   End of Session Equipment Utilized During Treatment: Gait belt;Oxygen (2L) Nurse Communication: Mobility status  Activity Tolerance: Patient tolerated treatment well Patient left: in bed;with call bell/phone within reach;with bed alarm set  OT Visit Diagnosis: Unsteadiness on feet (R26.81);Other abnormalities of gait and mobility (R26.89);Muscle weakness (generalized) (M62.81);Repeated falls (R29.6);History of falling (Z91.81);Other symptoms and signs involving cognitive function;Adult, failure to thrive (R62.7)                Time: 1021-1042 OT Time Calculation (min): 21 min Charges:  OT General Charges $OT Visit: 1 Visit OT Evaluation $OT Eval Moderate Complexity: [place] OTR/L Acute Rehabilitation Services Pager: [phone] Office: [place] 08/31/2020, 11:50 AM

## 2020-08-31 NOTE — Progress Notes (Signed)
Nephrology Follow-Up Consult Note   Assessment/Recommendations: [name] is a/an 75 y.o. male with a past medical history significant for HFrEF, DM 2, neuropathy, PAD, CAD, chronic osteomyelitis who presents with AKI and AMS  AKI, non-oliguric, improving: Unclear cause at this time. Renal ultrasound without hydronephrosis but enlarged kidneys unclear cause.  Most likely NSAID use and Entresto contributed to AKI.  Possible intermittent hypotension. AIN from abx in differential -given persistent AMS (which may very well be from uremia), started IHD on 1/11 (slow start protocol). #2 treatment 1/14. His symptoms now seem to be more so icu delirium/sundowning. Monitor for now, giving his kidneys a chance now to recover. Need to ensure he stays hydrated -recommend maintaining his temp dialysis catheter for now just in case he does require another treatment (hopefully not) -switched mIVF to d5 1/2ns w/ kcl 62meq  -kidney function improving slwoly -Renally dose meds -Continue to monitor daily Cr, Dose meds for GFR -Maintain MAP>65 for optimal renal perfusion.  -Avoid nephrotoxic medications including NSAIDs and iodinated intravenous contrast exposure unless the latter is absolutely indicated.  Preferred narcotic agents for pain control are hydromorphone, fentanyl, and methadone. Morphine should not be used. Avoid Baclofen and avoid oral sodium phosphate and magnesium citrate based laxatives / bowel preps. Continue strict Input and Output monitoring. Will monitor the patient closely with you and intervene or adjust therapy as indicated by changes in clinical status/labs    Altered mental status     -Likely multifactorial initially: uremia with gabapentin and cefepime contributing. -Hold further gabapentin and benzodiazepine -cefepime switched to zosyn -uremic symptoms improved with HD and with #2 treatment on 1/14 -now, I think this is more so delirium/sundowning. Definitely not uremia.  Probably can benefit from getting out of the stepdown unit -nothing acute on MRI  Hypernatremia -secondary to lack of free water intake. Na improved, hypotonic fluids as above, low threshold to change to isotonic fluids.  Hypokalemia -currently receiving K repletion, can increase KCl additive to 51meq if needed -replete prn  Metabolic acidosis: Likely associated with AKI. Stable.   C/w nahco3 1300mg  tid fir biw, co2 better  Osteomyelitis: of shoulders.    Per ID and ortho  CHF/HTN: Reduced ejection fraction.  Can continue Imdur and carvedilol.  Hold Entresto for now.  clinically dry on exam   [facility] 08/31/2020 10:23 AM  ___________________________________________________________  CC: AKI  Interval History/Subjective:  Remains altered/delirious. Sundowning. Refusing to take anything PO but did have dinner apparently last night. Mitts are on.   Medications:  Current Facility-Administered Medications  Medication Dose Route Frequency Provider Last Rate Last Admin  . acetaminophen (TYLENOL) tablet 1,000 mg  1,000 mg Oral Q6H PRN Neena Rhymes, MD   1,000 mg at 08/23/20 0931  . albuterol (VENTOLIN HFA) 108 (90 Base) MCG/ACT inhaler 2 puff  2 puff Inhalation Q6H PRN Norins, Heinz Knuckles, MD      . ARIPiprazole (ABILIFY) tablet 5 mg  5 mg Oral Daily Norins, Heinz Knuckles, MD   5 mg at 08/31/20 1008  . aspirin EC tablet 81 mg  81 mg Oral Daily Darliss Cheney, MD   81 mg at 08/31/20 1007  . buPROPion (WELLBUTRIN XL) 24 hr tablet 150 mg  150 mg Oral Daily Darliss Cheney, MD   150 mg at 08/31/20 1008  . carvedilol (COREG) tablet 6.25 mg  6.25 mg Oral BID WC Barb Merino, MD   6.25 mg at 08/31/20 0556  . chlorhexidine (PERIDEX) 0.12 % solution  15 mL  15 mL Mouth Rinse BID Darliss Cheney, MD   15 mL at 08/31/20 1009  . Chlorhexidine Gluconate Cloth 2 % PADS 6 each  6 each Topical Daily Darliss Cheney, MD   6 each at 08/31/20 1009  . clonazePAM (KLONOPIN)  disintegrating tablet 0.25 mg  0.25 mg Oral BID PRN Barb Merino, MD      . dextrose 5 % and 0.45 % NaCl with KCl 10 mEq/L infusion   Intravenous Continuous Gean Quint, MD 75 mL/hr at 08/31/20 0850 New Bag at 08/31/20 0850  . ferrous sulfate tablet 325 mg  325 mg Oral Daily Norins, Heinz Knuckles, MD   325 mg at 08/31/20 1009  . gabapentin (NEURONTIN) capsule 100 mg  100 mg Oral BID Barb Merino, MD   100 mg at 08/31/20 1008  . heparin injection 5,000 Units  5,000 Units Subcutaneous Q8H Darliss Cheney, MD   5,000 Units at 08/31/20 0603  . hydrALAZINE (APRESOLINE) injection 10 mg  10 mg Intravenous Q6H PRN Pahwani, Einar Grad, MD      . insulin aspart (novoLOG) injection 0-9 Units  0-9 Units Subcutaneous TID WC Darliss Cheney, MD   1 Units at 08/31/20 0659  . isosorbide mononitrate (IMDUR) 24 hr tablet 60 mg  60 mg Oral Daily Norins, Heinz Knuckles, MD   60 mg at 08/31/20 1007  . lidocaine (XYLOCAINE) 1 % (with pres) injection    PRN Markus Daft, MD   10 mL at 08/25/20 1632  . MEDLINE mouth rinse  15 mL Mouth Rinse q12n4p Darliss Cheney, MD   15 mL at 08/29/20 1436  . metoprolol tartrate (LOPRESSOR) injection 5 mg  5 mg Intravenous Q6H PRN Barb Merino, MD      . nitroGLYCERIN (NITROSTAT) SL tablet 0.4 mg  0.4 mg Sublingual Q5 min PRN Norins, Heinz Knuckles, MD      . pantoprazole (PROTONIX) injection 40 mg  40 mg Intravenous Q12H Darliss Cheney, MD   40 mg at 08/31/20 1007  . piperacillin-tazobactam (ZOSYN) IVPB 2.25 g  2.25 g Intravenous Q8H Pahwani, Einar Grad, MD 100 mL/hr at 08/31/20 0527 2.25 g at 08/31/20 0527  . potassium chloride SA (KLOR-CON) CR tablet 40 mEq  40 mEq Oral BID Barb Merino, MD   40 mEq at 08/31/20 1007  . Resource ThickenUp Clear   Oral PRN Darliss Cheney, MD      . rOPINIRole (REQUIP) tablet 1 mg  1 mg Oral BID Norins, Heinz Knuckles, MD   1 mg at 08/31/20 1008  . senna (SENOKOT) tablet 8.6 mg  1 tablet Oral BID Darliss Cheney, MD   8.6 mg at 08/30/20 2127  . sodium bicarbonate tablet 1,300 mg  1,300  mg Oral TID Darliss Cheney, MD   1,300 mg at 08/31/20 1007  . sodium chloride flush (NS) 0.9 % injection 10-40 mL  10-40 mL Intracatheter PRN Darliss Cheney, MD   10 mL at 08/29/20 2246  . tamsulosin (FLOMAX) capsule 0.4 mg  0.4 mg Oral BID Norins, Heinz Knuckles, MD   0.4 mg at 08/31/20 1008  . ticagrelor (BRILINTA) tablet 90 mg  90 mg Oral BID Darliss Cheney, MD   90 mg at 08/31/20 1008      Review of Systems: Neg 12-point review of systems  Physical Exam: Vitals:   08/31/20 0556 08/31/20 0800  BP: (!) 145/84 (!) 125/97  Pulse: (!) 104 (!) 102  Resp:  (!) 23  Temp:  98.3 F (36.8 C)  SpO2:  99%  Total I/O In: 240 [P.O.:240] Out: -   Intake/Output Summary (Last 24 hours) at 08/31/2020 1023 Last data filed at 08/31/2020 0900 Gross per 24 hour  Intake 2445 ml  Output 1400 ml  Net 1045 ml   Constitutional: Ill-appearing, lying in bed ENMT: dry mucosal membranes CV: reg rate, no edema bilateral lower extremities Respiratory: normal work of breathing Gastrointestinal: soft, non-tender, no palpable masses or hernias Skin: no visible lesions or rashes, poor skin turgor Neuro: awake, alert, altered, tangential speech, moves all ext spontaneously   Test Results I personally reviewed new and old clinical labs and radiology tests Lab Results  Component Value Date   NA 144 08/31/2020   K 2.9 (L) 08/31/2020   CL 110 08/31/2020   CO2 22 08/31/2020   BUN 51 (H) 08/31/2020   CREATININE 3.54 (H) 08/31/2020   GFR 118.86 03/19/2015   GLU 116 12/01/2017   CALCIUM 7.9 (L) 08/31/2020   ALBUMIN 2.5 (L) 08/28/2020   PHOS 5.7 (H) 08/28/2020

## 2020-08-31 NOTE — Progress Notes (Signed)
PROGRESS NOTE    [name]  SAY:[id number] DOB: [birth year] DOA: 08/22/2020 PCP: Patrecia Pour, Christean Grief, MD    Brief Narrative:  75 year old gentleman with history of extensive medical problems, chronic systolic heart failure, type 2 diabetes with neuropathy, peripheral arterial disease and chronic shoulder problems. Admitted 11/24-12/17 for septic arthritis of both shoulders with GBS and Pseudomonas with septic shock, osteomyelitis of the right humerus.  PICC line placed and discharged home on 8 weeks of cefepime until 1/28. 1/6, seen at ID office and readmitted with renal failure and orthopedic consultation. Developed acute uremic encephalopathy, cefepime changed to Zosyn.  Underwent hemodialysis with temporary catheter on 1/11, repeat on 1/14. Orthopedics recommended conservative management.  ID recommended Zosyn until 1/28. 1/15, overnight some confusion and clearing up in the morning. MRI normal.    Assessment & Plan:   Principal Problem:   Septic arthritis of shoulder ([place]) Active Problems:   Hyperlipidemia   Essential hypertension   BPH (benign prostatic hyperplasia)   Hyponatremia   Generalized anxiety disorder   Restless leg syndrome   Chronic systolic congestive heart failure, NYHA class 3 (HCC)   Hyperlipidemia associated with type 2 diabetes mellitus (HCC)   Diabetes mellitus type II, non insulin dependent (HCC)   AKI (acute kidney injury) ([place])   Osteomyelitis of right humerus (HCC)   Encephalopathy acute  Septic arthritis of bilateral shoulder/osteomyelitis of the humerus: Multiple procedures and aspirations.  Followed by orthopedics and they recommended antibiotic treatment.  ID on board. Antibiotics with Zosyn until 1/28.  Clinically improving. Urine cultures 1/9 with VRE, 50,000 colonies.  Followed by ID.  May not need to be treated.  Acute renal failure: Normal renal functions on previous admission.  Came into the hospital with creatinine 4.13 which peaked  to 5.3.  Multifactorial secondary to sepsis, AIN.  Developed confusion and encephalopathy and underwent hemodialysis 1/11, 1/14.  Followed by nephrology for dialysis need.  Urine output is adequate.  Chronic hypernatremia: Due to renal failure.  Treated with half-normal saline.  Levels are improving.  Acute metabolic infective encephalopathy: Suspected secondary to uremia.  Initially suspected secondary to cefepime.  Intermittent dialysis. Mental status fluctuates. CT head 1/9 with no acute findings. Patient does not have any focal deficits. MRI brain with no deficits.  Hypertension: Blood pressures are stable now.  Heart rate better with increased dose of Coreg.  As needed metoprolol available.  No true allergy.  Type 2 diabetes: On sliding scale insulin.  He is on Januvia at home.  Chronic systolic heart failure: Last echocardiogram on 07/10/2020 with ejection fraction 50 to 55%.  Euvolemic.  Lasix and Entresto on hold.  Carvedilol resumed.  History of coronary artery disease status post CABG: Patient on dual antiplatelet therapy with aspirin and Brilinta.  He is on carvedilol.  No evidence of acute coronary syndrome.  Acute hypoxemic respiratory failure: Due to fluid overload.  Resolving.  Mostly on room air.  Lungs are clear.  Generalized anxiety disorder: On multiple medications that were hold.  Wellbutrin resume. Resumed the decrease dose of Klonopin and gabapentin.  Hypokalemia: Replaced aggressively and monitor levels.  Start working with PT OT. Refer to SNF.  Will be able to transfer once mental status improves and no more needing hemodialysis. Can transfer to medical telemetry bed.  Consult palliative care to help with healthcare power of attorney/advance care planning.   DVT prophylaxis: heparin injection 5,000 Units Start: 08/29/20 1400 Place and maintain sequential compression device Start: 08/27/20 2345   Code  Status: Full code Family Communication: None.  Patient  does not have any family or friends.  His son is estranged.   Disposition Plan: Status is: Inpatient  Remains inpatient appropriate because:Inpatient level of care appropriate due to severity of illness   Dispo: The patient is from: Home              Anticipated d/c is to: Skilled nursing facility.              Anticipated d/c date is: > 3 days              Patient currently is not medically stable to d/c.         Consultants:   Nephrology  Orthopedics  Infectious disease  Palliative care  Procedures:   Hemodialysis 1/11, 1/14  Antimicrobials:  Anti-infectives (From admission, onward)   Start     Dose/Rate Route Frequency Ordered Stop   08/25/20 2200  piperacillin-tazobactam (ZOSYN) IVPB 2.25 g        2.25 g 100 mL/hr over 30 Minutes Intravenous Every 8 hours 08/25/20 1230     08/24/20 1800  piperacillin-tazobactam (ZOSYN) IVPB 2.25 g  Status:  Discontinued        2.25 g 100 mL/hr over 30 Minutes Intravenous Every 6 hours 08/24/20 1256 08/25/20 1230   08/24/20 1015  piperacillin-tazobactam (ZOSYN) IVPB 3.375 g  Status:  Discontinued        3.375 g 12.5 mL/hr over 240 Minutes Intravenous Every 8 hours 08/24/20 0925 08/24/20 1256   08/23/20 1000  ceFEPIme (MAXIPIME) 2 g in sodium chloride 0.9 % 100 mL IVPB  Status:  Discontinued        2 g 200 mL/hr over 30 Minutes Intravenous Every 24 hours 08/22/20 2355 08/24/20 0848   08/22/20 2245  ceFEPime (MAXIPIME) IVPB  Status:  Discontinued       Note to Pharmacy: Indication:  Septic arthritis/osteomyelitis  First Dose: No Last Day of Therapy:  09/12/2020 Labs - Once weekly:  CBC/D and BMP, Labs - Every other week:  ESR and CRP Method of administration: IV PushPharmacy to consult re: dose adjust with renal failure   2 g Intravenous Every 8 hours 08/22/20 2155 08/22/20 2354         Subjective: Patient seen and examined.  Overnight he had some confusion however in the morning his confusion is cleared.  He himself denies  any pain.  He feels weak overall.  Afebrile overnight. Urine output is adequate and 1400 mL last 24 hours. Monitor shows sinus tachycardia but mostly rate controlled. At the time of my examination, he is alert oriented x4.  Objective: Vitals:   08/30/20 2329 08/31/20 0420 08/31/20 0556 08/31/20 0800  BP: (!) 158/75 139/64 (!) 145/84 (!) 125/97  Pulse: (!) 105 98 (!) 104 (!) 102  Resp: 18 10  (!) 23  Temp: 98.1 F (36.7 C) 98.2 F (36.8 C)  98.3 F (36.8 C)  TempSrc: Oral Oral  Oral  SpO2: 99% 99%  99%  Weight:      Height:        Intake/Output Summary (Last 24 hours) at 08/31/2020 0953 Last data filed at 08/31/2020 0900 Gross per 24 hour  Intake 2445 ml  Output 1400 ml  Net 1045 ml   Filed Weights   08/22/20 2059 08/29/20 0700  Weight: 89.3 kg 81.7 kg    Examination:  General exam: Appears calm and comfortable  Chronically sick looking but not in any distress. Respiratory system:  Clear to auscultation. Respiratory effort normal.  No added sounds. He is on 2 L oxygen but adequately saturating. Cardiovascular system: S1 & S2 heard, RRR. No JVD, murmurs, rubs, gallops or clicks. No pedal edema. He has temporary catheter on the left IJ. Gastrointestinal system: Abdomen is nondistended, soft and nontender. No organomegaly or masses felt. Normal bowel sounds heard. Foley catheter with clear urine. Central nervous system: Alert and oriented x3.  No focal neurological deficits.  Moves all extremities. Extremities: Symmetric 5 x 5 power.  Generalized weakness.     Data Reviewed: I have personally reviewed following labs and imaging studies  CBC: Recent Labs  Lab 08/27/20 0458 08/28/20 0000 08/28/20 0455 08/28/20 1039 08/29/20 0320 08/30/20 0500 08/31/20 0315  WBC 8.0  --  7.9 7.8 6.7 6.3 5.3  NEUTROABS 5.2  --  5.0  --  4.0 3.6 3.1  HGB 10.3*   < > 10.8* 11.6* 10.2* 9.4* 8.8*  HCT 32.4*   < > 34.5* 35.3* 32.9* 29.3* 26.9*  MCV 89.8  --  91.3 90.5 91.4 91.3 92.4   PLT 268  --  307 283 258 222 204   < > = values in this interval not displayed.   Basic Metabolic Panel: Recent Labs  Lab 08/28/20 0455 08/28/20 1039 08/29/20 0320 08/30/20 1410 08/31/20 0315  NA 152* 152* 151* 150* 144  K 3.3* 3.5 3.3* 3.2* 2.9*  CL 113* 114* 114* 114* 110  CO2 21* 20* 23 23 22   GLUCOSE 117* 181* 155* 141* 136*  BUN 83* 83* 77* 55* 51*  CREATININE 4.71* 4.61* 4.40* 3.71* 3.54*  CALCIUM 8.9 8.8* 8.8* 8.2* 7.9*  PHOS  --  5.7*  --   --   --    GFR: Estimated Creatinine Clearance: 20.1 mL/min (A) (by C-G formula based on SCr of 3.54 mg/dL (H)). Liver Function Tests: Recent Labs  Lab 08/25/20 0444 08/28/20 1039  AST 19  --   ALT 13  --   ALKPHOS 83  --   BILITOT 1.0  --   PROT 6.6  --   ALBUMIN 2.2* 2.5*   No results for input(s): LIPASE, AMYLASE in the last 168 hours. No results for input(s): AMMONIA in the last 168 hours. Coagulation Profile: No results for input(s): INR, PROTIME in the last 168 hours. Cardiac Enzymes: No results for input(s): CKTOTAL, CKMB, CKMBINDEX, TROPONINI in the last 168 hours. BNP (last 3 results) No results for input(s): PROBNP in the last 8760 hours. HbA1C: No results for input(s): HGBA1C in the last 72 hours. CBG: Recent Labs  Lab 08/30/20 0607 08/30/20 1131 08/30/20 1628 08/30/20 2117 08/31/20 0559  GLUCAP 118* 135* 138* 146* 130*   Lipid Profile: No results for input(s): CHOL, HDL, LDLCALC, TRIG, CHOLHDL, LDLDIRECT in the last 72 hours. Thyroid Function Tests: No results for input(s): TSH, T4TOTAL, FREET4, T3FREE, THYROIDAB in the last 72 hours. Anemia Panel: No results for input(s): VITAMINB12, FOLATE, FERRITIN, TIBC, IRON, RETICCTPCT in the last 72 hours. Sepsis Labs: No results for input(s): PROCALCITON, LATICACIDVEN in the last 168 hours.  Recent Results (from the past 240 hour(s))  SARS CORONAVIRUS 2 (TAT 6-24 HRS) Nasopharyngeal Nasopharyngeal Swab     Status: None   Collection Time: 08/22/20 11:57  PM   Specimen: Nasopharyngeal Swab  Result Value Ref Range Status   SARS Coronavirus 2 NEGATIVE NEGATIVE Final    Comment: (NOTE) SARS-CoV-2 target nucleic acids are NOT DETECTED.  The SARS-CoV-2 RNA is generally detectable in upper and lower  respiratory specimens during the acute phase of infection. Negative results do not preclude SARS-CoV-2 infection, do not rule out co-infections with other pathogens, and should not be used as the sole basis for treatment or other patient management decisions. Negative results must be combined with clinical observations, patient history, and epidemiological information. The expected result is Negative.  Fact Sheet for Patients: SugarRoll.be  Fact Sheet for Healthcare Providers: https://www.woods-mathews.com/  This test is not yet approved or cleared by the [place] FDA and  has been authorized for detection and/or diagnosis of SARS-CoV-2 by FDA under an Emergency Use Authorization (EUA). This EUA will remain  in effect (meaning this test can be used) for the duration of the COVID-19 declaration under Se ction 564(b)(1) of the Act, 21 U.S.C. section 360bbb-3(b)(1), unless the authorization is terminated or revoked sooner.  Performed at [facility], [place] [address], [place], [place] [address]   MRSA PCR Screening     Status: None   Collection Time: 08/23/20  9:30 PM   Specimen: Nasal Mucosa; Nasopharyngeal  Result Value Ref Range Status   MRSA by PCR NEGATIVE NEGATIVE Final    Comment:        The GeneXpert MRSA Assay (FDA approved for NASAL specimens only), is one component of a comprehensive MRSA colonization surveillance program. It is not intended to diagnose MRSA infection nor to guide or monitor treatment for MRSA infections. Performed at [facility], [place] [address], [place], [place] [address]   Culture, Urine     Status: Abnormal   Collection Time: 08/24/20 12:01 AM    Specimen: Urine, Random  Result Value Ref Range Status   Specimen Description URINE, RANDOM  Final   Special Requests   Final    NONE Performed at [facility], [place] [address], [place],  [address]    Culture (A)  Final    50,000 COLONIES/mL VANCOMYCIN RESISTANT ENTEROCOCCUS   Report Status 08/29/2020 FINAL  Final   Organism ID, Bacteria VANCOMYCIN RESISTANT ENTEROCOCCUS (A)  Final      Susceptibility   Vancomycin resistant enterococcus - MIC*    AMPICILLIN >=32 RESISTANT Resistant     NITROFURANTOIN 128 RESISTANT Resistant     VANCOMYCIN >=32 RESISTANT Resistant     LINEZOLID 2 SENSITIVE Sensitive     * 50,000 COLONIES/mL VANCOMYCIN RESISTANT ENTEROCOCCUS         Radiology Studies: MR BRAIN WO CONTRAST  Result Date: 08/31/2020 CLINICAL DATA:  75 year old male with unexplained altered mental status. Acute kidney injury, dialysis. EXAM: MRI HEAD WITHOUT CONTRAST TECHNIQUE: Multiplanar, multiecho pulse sequences of the brain and surrounding structures were obtained without intravenous contrast. COMPARISON:  Head CT 08/24/2020.  Brain MRI 06/20/2019. FINDINGS: Brain: Stable cerebral volume from the prior MRI. No convincing restricted diffusion. No midline shift, mass effect, evidence of mass lesion, ventriculomegaly, extra-axial collection or acute intracranial hemorrhage. Cervicomedullary junction and pituitary are within normal limits. Confluent bilateral periventricular white matter T2 and FLAIR hyperintensity has progressed since 2020, along with patchy and widely scattered central and subcortical white matter signal changes, especially in the anterior frontal lobes. No cortical encephalomalacia or chronic cerebral blood products identified. Deep gray nuclei, brainstem and cerebellum are stable and within normal limits for age. Vascular: Major intracranial vascular flow voids are stable since 2020. Skull and upper cervical spine: Degenerative ligamentous hypertrophy about  the odontoid, and C3-C4 disc protrusion visible today on series 9, image 13 resulting in degenerative spinal stenosis with mild spinal  cord mass effect. Visualized bone marrow signal is within normal limits. Sinuses/Orbits: Prior sinus surgery including chronic postoperative changes to the bilateral frontal bones with fat packing and residual mucosal disease, better detailed on CT. Stable sinus aeration since 2020. Globes appear stable. Other: Mastoids remain well aerated. Grossly normal visible internal auditory structures. Negative visible scalp and face. IMPRESSION: 1. No acute intracranial abnormality. 2. Advanced bilateral cerebral white matter disease with progression since 2020, nonspecific but most commonly due to chronic small vessel ischemia. 3. 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 4. Chronic sinus disease and postoperative changes. Electronically Signed   By: Genevie Ann M.D.   On: 08/31/2020 04:17        Scheduled Meds: . ARIPiprazole  5 mg Oral Daily  . aspirin EC  81 mg Oral Daily  . buPROPion  150 mg Oral Daily  . carvedilol  6.25 mg Oral BID WC  . chlorhexidine  15 mL Mouth Rinse BID  . Chlorhexidine Gluconate Cloth  6 each Topical Daily  . ferrous sulfate  325 mg Oral Daily  . gabapentin  100 mg Oral BID  . heparin injection (subcutaneous)  5,000 Units Subcutaneous Q8H  . insulin aspart  0-9 Units Subcutaneous TID WC  . isosorbide mononitrate  60 mg Oral Daily  . mouth rinse  15 mL Mouth Rinse q12n4p  . pantoprazole (PROTONIX) IV  40 mg Intravenous Q12H  . potassium chloride  40 mEq Oral BID  . rOPINIRole  1 mg Oral BID  . senna  1 tablet Oral BID  . sodium bicarbonate  1,300 mg Oral TID  . tamsulosin  0.4 mg Oral BID  . ticagrelor  90 mg Oral BID   Continuous Infusions: . dextrose 5 % and 0.45 % NaCl with KCl 10 mEq/L 75 mL/hr at 08/31/20 0850  . piperacillin-tazobactam (ZOSYN)  IV 2.25 g (08/31/20 0527)      LOS: 9 days    Time spent: 35 minutes    Barb Merino, MD [facility] Pager [phone]

## 2020-09-01 DIAGNOSIS — N179 Acute kidney failure, unspecified: Secondary | ICD-10-CM | POA: Diagnosis not present

## 2020-09-01 DIAGNOSIS — M00819 Arthritis due to other bacteria, unspecified shoulder: Secondary | ICD-10-CM | POA: Diagnosis not present

## 2020-09-01 DIAGNOSIS — Z515 Encounter for palliative care: Secondary | ICD-10-CM | POA: Diagnosis not present

## 2020-09-01 LAB — CBC WITH DIFFERENTIAL/PLATELET
Abs Immature Granulocytes: 0.03 10*3/uL (ref 0.00–0.07)
Basophils Absolute: 0 10*3/uL (ref 0.0–0.1)
Basophils Relative: 1 %
Eosinophils Absolute: 0.6 10*3/uL — ABNORMAL HIGH (ref 0.0–0.5)
Eosinophils Relative: 9 %
HCT: 27.9 % — ABNORMAL LOW (ref 39.0–52.0)
Hemoglobin: 8.9 g/dL — ABNORMAL LOW (ref 13.0–17.0)
Immature Granulocytes: 0 %
Lymphocytes Relative: 14 %
Lymphs Abs: 1 10*3/uL (ref 0.7–4.0)
MCH: 29.4 pg (ref 26.0–34.0)
MCHC: 31.9 g/dL (ref 30.0–36.0)
MCV: 92.1 fL (ref 80.0–100.0)
Monocytes Absolute: 0.9 10*3/uL (ref 0.1–1.0)
Monocytes Relative: 12 %
Neutro Abs: 4.6 10*3/uL (ref 1.7–7.7)
Neutrophils Relative %: 64 %
Platelets: 219 10*3/uL (ref 150–400)
RBC: 3.03 MIL/uL — ABNORMAL LOW (ref 4.22–5.81)
RDW: 18.5 % — ABNORMAL HIGH (ref 11.5–15.5)
WBC: 7.1 10*3/uL (ref 4.0–10.5)
nRBC: 0 % (ref 0.0–0.2)

## 2020-09-01 LAB — IRON AND TIBC
Iron: 15 ug/dL — ABNORMAL LOW (ref 45–182)
Saturation Ratios: 8 % — ABNORMAL LOW (ref 17.9–39.5)
TIBC: 197 ug/dL — ABNORMAL LOW (ref 250–450)
UIBC: 182 ug/dL

## 2020-09-01 LAB — BASIC METABOLIC PANEL
Anion gap: 13 (ref 5–15)
BUN: 44 mg/dL — ABNORMAL HIGH (ref 8–23)
CO2: 23 mmol/L (ref 22–32)
Calcium: 8.4 mg/dL — ABNORMAL LOW (ref 8.9–10.3)
Chloride: 110 mmol/L (ref 98–111)
Creatinine, Ser: 3.39 mg/dL — ABNORMAL HIGH (ref 0.61–1.24)
GFR, Estimated: 18 mL/min — ABNORMAL LOW (ref >60)
Glucose, Bld: 148 mg/dL — ABNORMAL HIGH (ref 70–99)
Potassium: 3.6 mmol/L (ref 3.5–5.1)
Sodium: 146 mmol/L — ABNORMAL HIGH (ref 135–145)

## 2020-09-01 LAB — FERRITIN: Ferritin: 285 ng/mL (ref 24–336)

## 2020-09-01 LAB — MAGNESIUM: Magnesium: 2 mg/dL (ref 1.7–2.4)

## 2020-09-01 LAB — GLUCOSE, CAPILLARY
Glucose-Capillary: 130 mg/dL — ABNORMAL HIGH (ref 70–99)
Glucose-Capillary: 173 mg/dL — ABNORMAL HIGH (ref 70–99)
Glucose-Capillary: 155 mg/dL — ABNORMAL HIGH (ref 70–99)
Glucose-Capillary: 159 mg/dL — ABNORMAL HIGH (ref 70–99)

## 2020-09-01 MED ORDER — DARBEPOETIN ALFA 40 MCG/0.4ML IJ SOSY
40.0000 ug | PREFILLED_SYRINGE | Freq: Once | INTRAMUSCULAR | Status: AC
  Administered 2020-09-01: 40 ug via SUBCUTANEOUS
  Filled 2020-09-01: qty 0.4

## 2020-09-01 MED ORDER — PIPERACILLIN-TAZOBACTAM 3.375 G IVPB
3.3750 g | Freq: Three times a day (TID) | INTRAVENOUS | Status: DC
  Administered 2020-09-01 – 2020-09-06 (×16): 3.375 g via INTRAVENOUS
  Filled 2020-09-01 (×17): qty 50

## 2020-09-01 MED ORDER — CARVEDILOL 12.5 MG PO TABS
12.5000 mg | ORAL_TABLET | Freq: Two times a day (BID) | ORAL | Status: DC
  Administered 2020-09-01 – 2020-09-06 (×10): 12.5 mg via ORAL
  Filled 2020-09-01 (×10): qty 1

## 2020-09-01 NOTE — Consult Note (Signed)
Consultation Note Date: 09/01/2020   Patient Name: [name]  DOB: 21-Jul-1946  MRN: [id number]  Age / Sex: 75 y.o., male  PCP: Doreatha Lew, MD Referring Physician: Barb Merino, MD  Reason for Consultation: Establishing goals of care  HPI/Patient Profile: 75 y.o. male  with past medical history of CAD s/p CABG, HFpEF, DM w/neuropathy, PAD, generalized anxiety disorder, and chronic shoulder problems including osteomyelitis of the right humerus, who was admitted on 08/22/2020 from the ID office with renal failure. He had a recent admission from 11/24 - 12/17 for septic arthritis in both shoulders.  He developed encephalopathy secondary to multifactorial uremia and has undergone 2 sessions of hemodialysis.  Orthopedics has followed and there is no indication for surgical intervention at this time.  ID has made on-going recommendations for IV antibiotics thru 09/12/20.  The patient remains intermittently confused.  Clinical Assessment and Goals of Care:  I have reviewed medical records including EPIC notes, labs and imaging, received report from bedside RN, and spoken with Dr. Tommy [name], left a voice mail for his son and spoken with his friend [name] to discuss diagnosis prognosis, [place], EOL wishes, disposition and options.  I introduced Palliative Medicine as specialized medical care for people living with serious illness. It focuses on providing relief from the symptoms and stress of a serious illness.   We discussed a brief life review of the patient. [name] explained that [name] had two children.  One child died and the other child (Son) is completely estranged and will not respond to repeated phone calls.  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  She has been trying to assist him for the past 10 years.   [name] struck me as very concerned about Mr.  [name]'s well being.    [name] said that she has tried to have multiple conversations with [name] about whether or not he would want life support if he were nearing end of life.  [name] states that she would not wanted to be maintained on life support if she were in that position.  However [name] would never give a direct answer.  [name] doesn't know his wishes.    I explained that he is now intermittently confused and even if his wish was to have full scope life support he still needs someone to make medical decisions when he is unable to do so.   I asked [name] if she would be willing to do that for him.  [name] said she would be willing to make medical decisions for him if he wants her to do so.  [name] plans to go to the hospital to visit [name] today and will talk with him about it.  As far as functional and nutritional status Epic notes indicate that [name] has been living at home and using a wheel chair to help mobilize.  PT notes indicate that he needs more support with basic needs such as sitting upright  at this time and would not be able to be at home alone.    [name] has a severe most likely life limiting illness - given his osteomyelitis and septic arthritis now with fragile kidneys.  Our first step in assisting him is to facilitate the naming of an HCPOA.   Primary Decision Maker:  PATIENT    SUMMARY OF RECOMMENDATIONS     PMT and PMT Chaplain are attempting to facilitate the naming of an HCPOA   PMT will continue to follow with you.     Education needs to be provided to patient and HCPOA about anticipatory needs and prognosis  Anticipate patient will need SNF with Palliative to follow outpatient as well.   Code Status/Advance Care Planning:  full   Symptom Management:   Per primary team  Additional Recommendations (Limitations, Scope, Preferences):  Full Scope Treatment  Palliative Prophylaxis:   Delirium Protocol  Psycho-social/Spiritual:   Desire for  further Chaplaincy support: HCPOA facilitation requested  Prognosis:  Concerning given infection that is refractory to antibiotics in his shoulder and kidney failure secondary to infection, hypotension, antibiotics and maintenance medications    Discharge Planning: [place] for rehab with Palliative care service follow-up      Primary Diagnoses: Present on Admission: . Hyperlipidemia . Essential hypertension . Hyponatremia . Chronic systolic congestive heart failure, NYHA class 3 ([place]) . Hyperlipidemia associated with type 2 diabetes mellitus ([place]) . Septic arthritis of shoulder ([place]) . Osteomyelitis of right humerus ([place]) . BPH (benign prostatic hyperplasia) . Restless leg syndrome . Generalized anxiety disorder   I have reviewed the medical record, interviewed the patient and family, and examined the patient. The following aspects are pertinent.  Past Medical History:  Diagnosis Date  . Acute renal failure (ARF) ([place]) 08/21/2020  . Anemia    NOS  . Anxiety   . 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 BPH (benign prostatic hypertrophy)   . Broken foot Oct. 12, 2013   Right foot Fx  . CAD (coronary artery disease)   . Carotid stenosis, left   . Cellulitis and abscess of leg 03/2018   right leg  . Chronic hyponatremia   . Chronic pain syndrome   . COPD (chronic obstructive pulmonary disease) ([place])   . Depression   . Diabetes mellitus   . Diabetic foot ulcer ([place])   . DJD (degenerative joint disease)    L wrist  . DM2 (diabetes mellitus, type 2) ([place])   . GERD (gastroesophageal reflux disease)   . HLD (hyperlipidemia)   . HTN (hypertension)   . Hx of blood transfusion reaction   . Hyponatremia   . Lumbar disc disease   . MI (mitral incompetence)   . Myocardial infarction ([place])    x 2  . Osteoporosis    tx per [facility], Dr Prudencio Burly, thought due to heavy steriod use after 1978  . Peripheral neuropathy   . Peripheral vascular disease  ([place])   . Restless leg syndrome   . Spine fracture    hx, multiple  . Varicose veins   . 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 Vitamin D deficiency    Social History   Socioeconomic History  . Marital status: Divorced    Spouse name: Not on file  . Number of children: Not on file  . Years of education: Not on file  . Highest education level: Not on file  Occupational  History  . Not on file  Tobacco Use  . Smoking status: Former Smoker    Packs/day: 1.50    Years: 30.00    Pack years: 45.00    Types: Cigarettes    Quit date: 11/06/1988    Years since quitting: 31.8  . Smokeless tobacco: Never Used  Vaping Use  . Vaping Use: Never used  Substance and Sexual Activity  . Alcohol use: Not Currently    Comment: None in 30 years but drank beer before- 05/09/20  . Drug use: No  . Sexual activity: Not Currently  Other Topics Concern  . Not on file  Social History Narrative   Social History      Diet? I eat about what I want.       Do you drink/eat things with caffeine? yes      Marital status?                  divorced                  What year were you married? 1966 to 2007      Do you live in a house, apartment, assisted living, condo, trailer, etc.? [place]      Is it one or more stories? one      How many persons live in your home? Me only      Do you have any pets in your home? (please list) no      Highest level of education completed? Finished high school      Current or past profession: Clinical biochemist in [place] you exercise?               no                       Type & how often?      Advanced Directives      Do you have a living will? no      Do you have a DNR form?     no                             If not, do you want to discuss one?      Do you have signed POA/HPOA for forms? No      Functional Status      Do you have difficulty bathing or dressing yourself? No- shower      Do you have difficulty preparing food or  eating? No- Don't want to      Do you have difficulty managing your medications?      Do you have difficulty managing your finances?      Do you have difficulty affording your medications? No- paid for by [place] Strain: Not on [employer] Insecurity: Not on file  Transportation Needs: Not on file  Physical Activity: Not on file  Stress: Not on file  Social Connections: Not on file   Family History  Problem Relation Age of Onset  . Lung cancer Father 35  . Diabetes Mother 57  . Osteoporosis Mother   . Depression Mother   . Bleeding Disorder Mother   . Throat cancer Other        family hx - "bad living" - also lung CA, heart disease and renal failure    .  Diabetes Sister   . Suicidality Son 3  . Lung cancer Maternal Grandmother 24  . Diabetes Maternal Grandfather 83  . Heart disease Maternal Grandfather   . Heart attack Paternal Grandmother   . Brain cancer Paternal Grandfather 58    Allergies  Allergen Reactions  . Ivp Dye [Iodinated Diagnostic Agents]     Blood Pressure dropped----pt was pre-medicated with 13 hour prep and did fine with pre-meds--amy 03/09/11   . Sulfa Antibiotics Hives  . Sulfonamide Derivatives Hives and Itching  . Lisinopril Cough  . Metoprolol Nausea And Vomiting  . Statins Other (See Comments)    myalgias  . Propoxyphene N-Acetaminophen Other (See Comments)    Sharp pains- headache     Vital Signs: BP 134/68 (BP Location: Left Arm)   Pulse 97   Temp 98.2 F (36.8 C) (Oral)   Resp 16   Ht 6' (1.829 m)   Wt 81.7 kg   SpO2 94%   BMI 24.43 kg/m  Pain Scale: 0-10 POSS *See Group Information*: 1-Acceptable,Awake and alert Pain Score: Asleep   SpO2: SpO2: 94 % O2 Device:SpO2: 94 % O2 Flow Rate: .O2 Flow Rate (L/min): 3 L/min    Palliative Assessment/Data: 30%     Time In: 11:00 Time Out: 12:00 Time Total: 60 min. Visit consisted of counseling and education  dealing with the complex and emotionally intense issues surrounding the need for palliative care and symptom management in the setting of serious and potentially life-threatening illness. Greater than 50%  of this time was spent counseling and coordinating care related to the above assessment and plan.  Signed by: Florentina Jenny, PA-C Palliative Medicine  Please contact Palliative Medicine Team phone at [phone] for questions and concerns.  For individual provider: See Shea Evans

## 2020-09-01 NOTE — Progress Notes (Signed)
Pharmacy Antibiotic Note  [name] is a 75 y.o. male admitted on 08/22/2020 with Bilateral septic arthritis with Group B streptococcus and Pseudomonas. Pt being followed by ID as outpt and on Cefepime which was continued inpatient (planned 8 wks course with stop date of 1/28). No debridement planned by ortho.  Pt underwent iHD last on 11/14, currently holding off for now as pt is making urine. CrCl 20-25 ml/min so will not adjust dose for now.  Plan: Change zosyn to 3.375gm IV q8h Will follow renal function and clinical progress    Height: 6' (182.9 cm) Weight: 81.7 kg (180 lb 1.9 oz) IBW/kg (Calculated) : 77.6  Temp (24hrs), Avg:98.6 F (37 C), Min:98.2 F (36.8 C), Max:99.5 F (37.5 C)  Recent Labs  Lab 08/28/20 1039 08/29/20 0320 08/30/20 0500 08/30/20 1410 08/31/20 0315 09/01/20 0500  WBC 7.8 6.7 6.3  --  5.3 7.1  CREATININE 4.61* 4.40*  --  3.71* 3.54* 3.39*    Estimated Creatinine Clearance: 21 mL/min (A) (by C-G formula based on SCr of 3.39 mg/dL (H)).    Allergies  Allergen Reactions  . Ivp Dye [Iodinated Diagnostic Agents]     Blood Pressure dropped----pt was pre-medicated with 13 hour prep and did fine with pre-meds--amy 03/09/11   . Sulfa Antibiotics Hives  . Sulfonamide Derivatives Hives and Itching  . Lisinopril Cough  . Metoprolol Nausea And Vomiting  . Statins Other (See Comments)    myalgias  . Propoxyphene N-Acetaminophen Other (See Comments)    Sharp pains- headache    Antimicrobials this admission: Cefepime 12/17 (Started last admission) >> 1/9 Zosyn 1/9 >>  Hildred Laser, PharmD Clinical Pharmacist **Pharmacist phone directory can now be found on [email or web address] ([email or web address]).  Listed under [place].

## 2020-09-01 NOTE — Progress Notes (Signed)
PROGRESS NOTE    [name]  NFA:[id number] DOB: 10-Nov-1945 DOA: 08/22/2020 PCP: Patrecia Pour, Christean Grief, MD    Brief Narrative:  75 year old gentleman with history of extensive medical problems, chronic systolic heart failure, type 2 diabetes with neuropathy, peripheral arterial disease and chronic shoulder problems. Admitted 11/24-12/17 for septic arthritis of both shoulders with GBS and Pseudomonas with septic shock, osteomyelitis of the right humerus.  PICC line placed and discharged home on 8 weeks of cefepime until 1/28. 1/6, seen at ID office and readmitted with renal failure and orthopedic consultation. Developed acute uremic encephalopathy, cefepime changed to Zosyn.  Underwent hemodialysis with temporary catheter on 1/11, repeat on 1/14. Orthopedics recommended conservative management.  ID recommended Zosyn until 1/28. 1/15, overnight some confusion and clearing up in the morning. MRI normal.    Assessment & Plan:   Principal Problem:   Septic arthritis of shoulder ([place]) Active Problems:   Hyperlipidemia   Essential hypertension   BPH (benign prostatic hyperplasia)   Hyponatremia   Generalized anxiety disorder   Restless leg syndrome   Chronic systolic congestive heart failure, NYHA class 3 (HCC)   Hyperlipidemia associated with type 2 diabetes mellitus (HCC)   Diabetes mellitus type II, non insulin dependent (HCC)   AKI (acute kidney injury) ([place])   Osteomyelitis of right humerus (HCC)   Encephalopathy acute  Septic arthritis of bilateral shoulder/osteomyelitis of the humerus:  Multiple procedures and aspirations.  Followed by orthopedics and they recommended antibiotic treatment.  ID on board. Antibiotics with Zosyn until 1/28.  Clinically improving. Urine cultures 1/9 with VRE, 50,000 colonies.  Not treating.  Acute renal failure: Normal renal functions on previous admission.  Came into the hospital with creatinine 4.13 which peaked to 5.3.  Multifactorial  secondary to sepsis, AIN.  Developed confusion and encephalopathy and underwent hemodialysis 1/11, 1/14.  Followed by nephrology for dialysis need.  Urine output is adequate. Maintain Foley catheter. Urine output 1100 mL last 24 hours.   Chronic hypernatremia: Due to renal failure.  Treated with half-normal saline.  Levels are improving. Discontinued IV fluid today.  Acute metabolic infective encephalopathy: Suspected secondary to uremia.  Initially suspected secondary to cefepime.  Intermittent dialysis. Mental status fluctuates. CT head 1/9 with no acute findings. Patient does not have any focal deficits. MRI brain with no deficits. Mental status improving.  Hypertension: Blood pressures are stable now. Heart rate control remains issue.  Increase dose of carvedilol.  He does not want to use metoprolol.  Type 2 diabetes: On sliding scale insulin.  He is on Januvia at home.  Chronic systolic heart failure: Last echocardiogram on 07/10/2020 with ejection fraction 50 to 55%.  Euvolemic.  Lasix and Entresto on hold.  Carvedilol resumed.  Increasing dose today.  History of coronary artery disease status post CABG: Patient on dual antiplatelet therapy with aspirin and Brilinta.  He is on carvedilol.  No evidence of acute coronary syndrome.  Acute hypoxemic respiratory failure: Due to fluid overload.  Using 2 to 3 L of oxygen.  Start chest physiotherapy and incentive spirometry.  Generalized anxiety disorder: On multiple medications that were hold.  Wellbutrin resume. Resumed the decrease dose of Klonopin and gabapentin.  Hypokalemia: Replaced aggressively and monitor levels.  Start working with PT OT. Refer to SNF.  Will be able to transfer once mental status improves and no more needing hemodialysis. Can transfer to medical telemetry bed.  Consult palliative care to help with healthcare power of attorney/advance care planning.   DVT prophylaxis:  heparin injection 5,000 Units Start:  08/29/20 1400 Place and maintain sequential compression device Start: 08/27/20 2345   Code Status: Full code Family Communication: None.  Patient does not have any family or friends.  His son is estranged.   Disposition Plan: Status is: Inpatient  Remains inpatient appropriate because:Inpatient level of care appropriate due to severity of illness   Dispo: The patient is from: Home              Anticipated d/c is to: Skilled nursing facility.              Anticipated d/c date is: 2 days.              Patient currently is not medically stable to d/c.  Patient will be able to go to skilled nursing facility once renal function stabilized.       Consultants:   Nephrology  Orthopedics  Infectious disease  Palliative care  Procedures:   Hemodialysis 1/11, 1/14  Antimicrobials:  Anti-infectives (From admission, onward)   Start     Dose/Rate Route Frequency Ordered Stop   09/01/20 1400  piperacillin-tazobactam (ZOSYN) IVPB 3.375 g        3.375 g 12.5 mL/hr over 240 Minutes Intravenous Every 8 hours 09/01/20 0950     08/25/20 2200  piperacillin-tazobactam (ZOSYN) IVPB 2.25 g  Status:  Discontinued        2.25 g 100 mL/hr over 30 Minutes Intravenous Every 8 hours 08/25/20 1230 09/01/20 0950   08/24/20 1800  piperacillin-tazobactam (ZOSYN) IVPB 2.25 g  Status:  Discontinued        2.25 g 100 mL/hr over 30 Minutes Intravenous Every 6 hours 08/24/20 1256 08/25/20 1230   08/24/20 1015  piperacillin-tazobactam (ZOSYN) IVPB 3.375 g  Status:  Discontinued        3.375 g 12.5 mL/hr over 240 Minutes Intravenous Every 8 hours 08/24/20 0925 08/24/20 1256   08/23/20 1000  ceFEPIme (MAXIPIME) 2 g in sodium chloride 0.9 % 100 mL IVPB  Status:  Discontinued        2 g 200 mL/hr over 30 Minutes Intravenous Every 24 hours 08/22/20 2355 08/24/20 0848   08/22/20 2245  ceFEPime (MAXIPIME) IVPB  Status:  Discontinued       Note to Pharmacy: Indication:  Septic arthritis/osteomyelitis  First  Dose: No Last Day of Therapy:  09/12/2020 Labs - Once weekly:  CBC/D and BMP, Labs - Every other week:  ESR and CRP Method of administration: IV PushPharmacy to consult re: dose adjust with renal failure   2 g Intravenous Every 8 hours 08/22/20 2155 08/22/20 2354         Subjective: Patient seen and examined.  No overnight events.  No more confusion.  He himself denies any complaints.  He wants to go home with caretaker.  He agrees for short-term rehab. Telemetry shows sinus tachycardia with suboptimal rate control.  Patient denies any chest pain or palpitations.  Objective: Vitals:   09/01/20 0000 09/01/20 0501 09/01/20 0724 09/01/20 0931  BP: (!) 156/79 (!) 142/65 (!) 159/81 137/83  Pulse: (!) 103 (!) 108 (!) 115 (!) 115  Resp: _0 Temp:  99.5 F (37.5 C) 98.6 F (37 C)   TempSrc:  Oral Oral   SpO2: 92% 92%  95%  Weight:      Height:        Intake/Output Summary (Last 24 hours) at 09/01/2020 0951 Last data filed at 08/31/2020 1700 Gross per 24  hour  Intake 878.65 ml  Output 1100 ml  Net -221.35 ml   Filed Weights   08/22/20 2059 08/29/20 0700  Weight: 89.3 kg 81.7 kg    Examination:  General exam: Appears calm and comfortable  He is alert oriented x4.   Respiratory system: Conducted airway sounds.  No wheezing or crepitations. Cardiovascular system: S1 & S2 heard, RRR. No JVD, murmurs, rubs, gallops or clicks. No pedal edema. He has temporary catheter on the left IJ. Venous engorgement of the legs. Gastrointestinal system: Abdomen is nondistended, soft and nontender. No organomegaly or masses felt. Normal bowel sounds heard. Foley catheter with clear urine. Central nervous system: Alert and oriented x4.  No focal neurological deficits.  Moves all extremities. Extremities: Symmetric 5 x 5 power.  Generalized weakness.     Data Reviewed: I have personally reviewed following labs and imaging studies  CBC: Recent Labs  Lab 08/28/20 0455  08/28/20 1039 08/29/20 0320 08/30/20 0500 08/31/20 0315 09/01/20 0500  WBC 7.9 7.8 6.7 6.3 5.3 7.1  NEUTROABS 5.0  --  4.0 3.6 3.1 4.6  HGB 10.8* 11.6* 10.2* 9.4* 8.8* 8.9*  HCT 34.5* 35.3* 32.9* 29.3* 26.9* 27.9*  MCV 91.3 90.5 91.4 91.3 92.4 92.1  PLT 307 283 258 222 204 098   Basic Metabolic Panel: Recent Labs  Lab 08/28/20 1039 08/29/20 0320 08/30/20 1410 08/31/20 0315 09/01/20 0500  NA 152* 151* 150* 144 146*  K 3.5 3.3* 3.2* 2.9* 3.6  CL 114* 114* 114* 110 110  CO2 20* _0 GLUCOSE 181* 155* 141* 136* 148*  BUN 83* 77* 55* 51* 44*  CREATININE 4.61* 4.40* 3.71* 3.54* 3.39*  CALCIUM 8.8* 8.8* 8.2* 7.9* 8.4*  MG  --   --   --   --  2.0  PHOS 5.7*  --   --   --   --    GFR: Estimated Creatinine Clearance: 21 mL/min (A) (by C-G formula based on SCr of 3.39 mg/dL (H)). Liver Function Tests: Recent Labs  Lab 08/28/20 1039  ALBUMIN 2.5*   No results for input(s): LIPASE, AMYLASE in the last 168 hours. No results for input(s): AMMONIA in the last 168 hours. Coagulation Profile: No results for input(s): INR, PROTIME in the last 168 hours. Cardiac Enzymes: No results for input(s): CKTOTAL, CKMB, CKMBINDEX, TROPONINI in the last 168 hours. BNP (last 3 results) No results for input(s): PROBNP in the last 8760 hours. HbA1C: No results for input(s): HGBA1C in the last 72 hours. CBG: Recent Labs  Lab 08/31/20 0559 08/31/20 1136 08/31/20 1553 08/31/20 2120 09/01/20 0548  GLUCAP 130* 189* 172* 188* 130*   Lipid Profile: No results for input(s): CHOL, HDL, LDLCALC, TRIG, CHOLHDL, LDLDIRECT in the last 72 hours. Thyroid Function Tests: No results for input(s): TSH, T4TOTAL, FREET4, T3FREE, THYROIDAB in the last 72 hours. Anemia Panel: No results for input(s): VITAMINB12, FOLATE, FERRITIN, TIBC, IRON, RETICCTPCT in the last 72 hours. Sepsis Labs: No results for input(s): PROCALCITON, LATICACIDVEN in the last 168 hours.  Recent Results (from the past 240  hour(s))  SARS CORONAVIRUS 2 (TAT 6-24 HRS) Nasopharyngeal Nasopharyngeal Swab     Status: None   Collection Time: 08/22/20 11:57 PM   Specimen: Nasopharyngeal Swab  Result Value Ref Range Status   SARS Coronavirus 2 NEGATIVE NEGATIVE Final    Comment: (NOTE) SARS-CoV-2 target nucleic acids are NOT DETECTED.  The SARS-CoV-2 RNA is generally detectable in upper and lower respiratory specimens during the acute phase of  infection. Negative results do not preclude SARS-CoV-2 infection, do not rule out co-infections with other pathogens, and should not be used as the sole basis for treatment or other patient management decisions. Negative results must be combined with clinical observations, patient history, and epidemiological information. The expected result is Negative.  Fact Sheet for Patients: SugarRoll.be  Fact Sheet for Healthcare Providers: https://www.woods-mathews.com/  This test is not yet approved or cleared by the [place] FDA and  has been authorized for detection and/or diagnosis of SARS-CoV-2 by FDA under an Emergency Use Authorization (EUA). This EUA will remain  in effect (meaning this test can be used) for the duration of the COVID-19 declaration under Se ction 564(b)(1) of the Act, 21 U.S.C. section 360bbb-3(b)(1), unless the authorization is terminated or revoked sooner.  Performed at [facility], [place] [address], [place], [place] [address]   MRSA PCR Screening     Status: None   Collection Time: 08/23/20  9:30 PM   Specimen: Nasal Mucosa; Nasopharyngeal  Result Value Ref Range Status   MRSA by PCR NEGATIVE NEGATIVE Final    Comment:        The GeneXpert MRSA Assay (FDA approved for NASAL specimens only), is one component of a comprehensive MRSA colonization surveillance program. It is not intended to diagnose MRSA infection nor to guide or monitor treatment for MRSA infections. Performed at [facility], [place] [address] [place], [place] [address]   Culture, Urine     Status: Abnormal   Collection Time: 08/24/20 12:01 AM   Specimen: Urine, Random  Result Value Ref Range Status   Specimen Description URINE, RANDOM  Final   Special Requests   Final    NONE Performed at [facility], [place] [address] [place], [place] [address]    Culture (A)  Final    50,000 COLONIES/mL VANCOMYCIN RESISTANT ENTEROCOCCUS   Report Status 08/29/2020 FINAL  Final   Organism ID, Bacteria VANCOMYCIN RESISTANT ENTEROCOCCUS (A)  Final      Susceptibility   Vancomycin resistant enterococcus - MIC*    AMPICILLIN >=32 RESISTANT Resistant     NITROFURANTOIN 128 RESISTANT Resistant     VANCOMYCIN >=32 RESISTANT Resistant     LINEZOLID 2 SENSITIVE Sensitive     * 50,000 COLONIES/mL VANCOMYCIN RESISTANT ENTEROCOCCUS         Radiology Studies: MR BRAIN WO CONTRAST  Result Date: 08/31/2020 CLINICAL DATA:  75 year old male with unexplained altered mental status. Acute kidney injury, dialysis. EXAM: MRI HEAD WITHOUT CONTRAST TECHNIQUE: Multiplanar, multiecho pulse sequences of the brain and surrounding structures were obtained without intravenous contrast. COMPARISON:  Head CT 08/24/2020.  Brain MRI 06/20/2019. FINDINGS: Brain: Stable cerebral volume from the prior MRI. No convincing restricted diffusion. No midline shift, mass effect, evidence of mass lesion, ventriculomegaly, extra-axial collection or acute intracranial hemorrhage. Cervicomedullary junction and pituitary are within normal limits. Confluent bilateral periventricular white matter T2 and FLAIR hyperintensity has progressed since 2020, along with patchy and widely scattered central and subcortical white matter signal changes, especially in the anterior frontal lobes. No cortical encephalomalacia or chronic cerebral blood products identified. Deep gray nuclei, brainstem and cerebellum are stable and within normal limits for age. Vascular: Major  intracranial vascular flow voids are stable since 2020. Skull and upper cervical spine: Degenerative ligamentous hypertrophy about the odontoid, and C3-C4 disc protrusion visible today on series 9, image 13 resulting in degenerative spinal stenosis with mild spinal cord mass effect. Visualized bone marrow signal  is within normal limits. Sinuses/Orbits: Prior sinus surgery including chronic postoperative changes to the bilateral frontal bones with fat packing and residual mucosal disease, better detailed on CT. Stable sinus aeration since 2020. Globes appear stable. Other: Mastoids remain well aerated. Grossly normal visible internal auditory structures. Negative visible scalp and face. IMPRESSION: 1. No acute intracranial abnormality. 2. Advanced bilateral cerebral white matter disease with progression since 2020, nonspecific but most commonly due to chronic small vessel ischemia. 3. 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 4. Chronic sinus disease and postoperative changes. Electronically Signed   By: Genevie Ann M.D.   On: 08/31/2020 04:17        Scheduled Meds: . ARIPiprazole  5 mg Oral Daily  . aspirin EC  81 mg Oral Daily  . buPROPion  150 mg Oral Daily  . carvedilol  12.5 mg Oral BID WC  . chlorhexidine  15 mL Mouth Rinse BID  . Chlorhexidine Gluconate Cloth  6 each Topical Daily  . darbepoetin (ARANESP) injection - NON-DIALYSIS  40 mcg Subcutaneous Once  . ferrous sulfate  325 mg Oral Daily  . gabapentin  100 mg Oral BID  . heparin injection (subcutaneous)  5,000 Units Subcutaneous Q8H  . insulin aspart  0-9 Units Subcutaneous TID WC  . isosorbide mononitrate  60 mg Oral Daily  . mouth rinse  15 mL Mouth Rinse q12n4p  . pantoprazole (PROTONIX) IV  40 mg Intravenous Q12H  . rOPINIRole  1 mg Oral BID  . senna  1 tablet Oral BID  . tamsulosin  0.4 mg Oral BID  . ticagrelor  90 mg Oral BID   Continuous Infusions: .  piperacillin-tazobactam (ZOSYN)  IV       LOS: 10 days    Time spent: 32 minutes    Barb Merino, MD [facility] Pager [phone]

## 2020-09-01 NOTE — Progress Notes (Signed)
  Speech Language Pathology Treatment: Dysphagia  Patient Details Name: [name] MRN: [id number] DOB: Apr 06, 1946 Today's Date: 09/01/2020 Time: 4827-0786 SLP Time Calculation (min) (ACUTE ONLY): 20 min  Assessment / Plan / Recommendation Clinical Impression  Pt consumed puree consistency (magic cup) and honey-thick liquids during dysphagia treatment. Coughing and a wet vocal quality were noted during PO trials which are concerning for reduced airway protection. Overt s/s of aspiration were significantly reduced when pt was cued to take smaller sips of honey-thick liquids and to alternate with teaspoons of puree across 4 oz of magic cup and 8oz of thickened liquid. Family friend was present who assisted in today's treatment. Education was provided to both the pt and the family friend concerning pt's diet and aspiration safety (e.g. smaller sips and appropriate foods/liquids). SLP demonstrated to family friend how to thicken pt's water upon request. Discussed potential for starting exercises during treatment to try to strengthen pt's cough and swallowing with pt and family friend agreeing.   HPI HPI: Pt is a 75 y.o. male presenting with septic arthritis of shoulder and AMS. He has a history of chronic systold heart failure, DM with completication of neuropathy, PAD, ASVD diffuse, and chronic shoulder problems. Pt also has a history of GERD, blindness of left eye, COPD, and a left carotid endarterectomy. He also had vocal fold surgery in 1996. 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 Pt did not follow up for further treatment.      SLP Plan  Continue with current plan of care       Recommendations  Diet recommendations: Dysphagia 2 (fine chop);Honey-thick liquid Liquids provided via: Straw Medication Administration: Crushed with puree Supervision: Full supervision/cueing for compensatory strategies;Staff to assist with self  feeding Compensations: Slow rate;Small sips/bites;Other (Comment) (Follow food with liquid) Postural Changes and/or Swallow Maneuvers: Seated upright 90 degrees;Upright 30-60 min after meal                Oral Care Recommendations: Oral care BID Follow up Recommendations: Skilled Nursing facility SLP Visit Diagnosis: Dysphagia, oropharyngeal phase (R13.12) Plan: Continue with current plan of care       GO                Lebron Conners 09/01/2020, 4:05 PM

## 2020-09-01 NOTE — Progress Notes (Signed)
Addendum to my earlier consultation note:   Patient's only son [name] returned my call.   He confirms he is estranged from his father.  We discussed his father's difficulties with mental illness particularly paranoia.  He states his father has been in and out of treatment.  He doesn't know if there was ever a firm diagnosis.   [name] expresses that he father truly feels the whole world is out to get him.  [name] states his father has been disabled since the early 1980's and has a long term history of chronic pain and narcotic dependence.    He tells me that the [facility] police called him about 7 months ago because his father had pulled a gun on a Education officer, museum.  Finally he says he has been sleeping in his car for over a year.  I asked [name] if he would prefer to defer HCPOA to [employer] and he replied "Yes, she knows him and lives down there near him".   Anthony Jenny, PA-C Palliative Medicine Office:  [phone]

## 2020-09-01 NOTE — TOC Initial Note (Signed)
Transition of Care [facility]) - Initial/Assessment Note    Patient Details  Name: [name] MRN: [id number] Date of Birth: 1946-05-18  Transition of Care [facility]) CM/SW Contact:    Bethann Berkshire, [place] Phone Number: 09/01/2020, 12:04 PM  Clinical Narrative:                  CSW met with pt to discuss SNF recommendation. Pt lives at home with a roommate he named  As Engineer, civil (consulting). He was unsure of her phone number but consented to [place] contacting her if needed. Pt otherwise did not mention any additional family or friends. Pt is agreeable to SNF recommendation and work up. Fl2 complete and bed requests faxed in hub.  Expected Discharge Plan: Skilled Nursing Facility Barriers to Discharge: Continued Medical Work up   Patient Goals and CMS Choice        Expected Discharge Plan and Services Expected Discharge Plan: [place] arrangements for the past 2 months: Single Family Home                                      Prior Living Arrangements/Services Living arrangements for the past 2 months: Single Family Home Lives with:: Roommate          Need for Family Participation in Patient Care: No (Comment) Care giver support system in place?: No (comment)      Activities of Daily Living Home Assistive Devices/Equipment: Eyeglasses,Dentures (specify type),Walker (specify type),Wheelchair ADL Screening (condition at time of admission) Patient's cognitive ability adequate to safely complete daily activities?: Yes Is the patient deaf or have difficulty hearing?: Yes Does the patient have difficulty seeing, even when wearing glasses/contacts?: No Does the patient have difficulty concentrating, remembering, or making decisions?: No Patient able to express need for assistance with ADLs?: Yes Does the patient have difficulty dressing or bathing?: No Independently performs ADLs?: Yes (appropriate for developmental age) Does the patient have difficulty  walking or climbing stairs?: Yes Weakness of Legs: Both Weakness of Arms/Hands: Both  Permission Sought/Granted   Permission granted to share information with : Yes, Verbal Permission Granted  Share Information with NAME: Roommate Jennifer           Emotional Assessment Appearance:: Appears stated age Attitude/Demeanor/Rapport: Engaged Affect (typically observed): Accepting Orientation: : Oriented to Self,Oriented to Place,Oriented to  Time,Oriented to Situation Alcohol / Substance Use: Not Applicable Psych Involvement: No (comment)  Admission diagnosis:  Septic arthritis of shoulder ([place]) [M00.9] Patient Active Problem List   Diagnosis Date Noted  . Encephalopathy acute   . Septic arthritis of shoulder ([place]) 08/22/2020  . Osteomyelitis of right humerus ([place]) 08/22/2020  . AKI (acute kidney injury) ([place]) 08/21/2020  . Sepsis due to group B Streptococcus ([place])   . Pyogenic arthritis of left shoulder region ([place])   . Acute pain of right shoulder   . Acute respiratory failure ([place]) 07/12/2020  . Shortness of breath   . Status post aortobifemoral bypass surgery   . History of carotid endarterectomy   . Type 2 diabetes mellitus with hyperlipidemia ([place])   . Diabetes mellitus type II, non insulin dependent ([place])   . Septic shock ([place]) 07/09/2020  . Pressure injury of skin 05/12/2020  . Amputation of right middle finger 05/12/2020  . Long term (current) use of opiate analgesic 05/07/2020  . Pain in left foot 05/07/2020  .  Neck pain 05/07/2020  . Chronic systolic congestive heart failure, NYHA class 3 ([place]) 01/08/2020  . Constipation 01/07/2020  . CAD (coronary artery disease) 01/03/2020  . Hyperlipidemia associated with type 2 diabetes mellitus ([place]) 01/03/2020  . S/P coronary artery stent placement 01/03/2020  . Major depressive disorder, recurrent episode, severe, with psychotic behavior ([place]) 12/29/2019  . Delusional disorder ([place]) 12/29/2019  . NSTEMI (non-ST elevated  myocardial infarction) ([place]) 05/19/2019  . MRSA infection 01/18/2019  . Occipital neuralgia 01/18/2019  . Osteomyelitis of left foot ([place]) 01/18/2019  . Type 2 diabetes mellitus with diabetic peripheral angiopathy without gangrene, without long-term current use of insulin ([place]) 01/18/2019  . Acute on chronic diastolic congestive heart failure ([place]) 01/15/2019  . Varus foot deformity, acquired, left 11/28/2018  . Generalized anxiety disorder 07/03/2018  . Restless leg syndrome 07/03/2018  . Paranoia ([place]) 07/03/2018  . PTSD (post-traumatic stress disorder) 05/29/2018  . Abscess of right lower leg   . Severe protein-calorie malnutrition ([place])   . 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 Diabetic polyneuropathy associated with type 2 diabetes mellitus ([place])   . Cellulitis 04/01/2018  . Cellulitis of right lower extremity 04/01/2018  . Tachycardia 04/01/2018  . Hx of CABG 09/22/2015  . Unstable angina ([place]) 09/20/2015  . Chest pain, rule out acute myocardial infarction 09/18/2015  . Diabetes mellitus type 2 with peripheral artery disease ([place]) 09/18/2015  . Hyponatremia 09/18/2015  . Chest pain on exertion 09/18/2015  . Varicose veins of bilateral lower extremities with other complications 95/74/7340  . Occlusion and stenosis of carotid artery without mention of cerebral infarction 10/18/2011  . SINUSITIS, ACUTE 03/13/2010  . BACK PAIN 11/13/2009  . FATIGUE 10/17/2009  . Diabetic neuropathy associated with diabetes mellitus due to underlying condition ([place]) 08/29/2009  . Vitamin D deficiency 08/29/2009  . Hyposmolality and/or hyponatremia 08/29/2009  . VISUAL ACUITY, DECREASED, LEFT EYE 08/29/2009  . Peripheral artery disease ([place]) 08/29/2009  . SINUSITIS, CHRONIC 08/29/2009  . NEPHROLITHIASIS 08/29/2009  . FIBROMYALGIA 08/29/2009  . BICEPS TENDON RUPTURE, RIGHT 08/11/2009  . DIABETES MELLITUS, TYPE II 08/05/2009  . Hyperlipidemia  08/05/2009  . ANEMIA-NOS 08/05/2009  . Anxiety state 08/05/2009  . DEPRESSION 08/05/2009  . Chronic pain syndrome 08/05/2009  . Essential hypertension 08/05/2009  . GERD 08/05/2009  . BPH (benign prostatic hyperplasia) 08/05/2009  . [place] DISEASE, LUMBAR 08/05/2009  . Osteoporosis 08/05/2009   PCP:  Doreatha Lew, MD Pharmacy:   [facility], [place] [address] Phone: [phone] Fax: [phone]     Social Determinants of Health (SDOH) Interventions    Readmission Risk Interventions No flowsheet data found.

## 2020-09-01 NOTE — Progress Notes (Signed)
This chaplain responded to PMT consult for assisting in the completion of Pt. Advance Directive.  The Pt. is awake and willing to participate in Advance Directive: HCPOA education with the chaplain.  The Pt. friend [name] is bedside.   The chaplain understands the Pt. is not ready to make a decision about who will be his healthcare agent.  The Pt. prefers to read the document and wait for [name] to find a phone number for his grandson-Matthew Luzier.  Rodman Key is a [place] resident.  The chaplain also verified with [name] and the Pt.,  [name] is the Pt. caregiver and the Pt. doesn't have a roommate.  This chaplain will return for Advance Directive F/U on Tuesday morning.

## 2020-09-01 NOTE — Progress Notes (Signed)
Nephrology Follow-Up Note   Assessment/Recommendations: [name] is a/an 75 y.o. male with a past medical history significant for HFrEF, DM 2, neuropathy, PAD, CAD, chronic osteomyelitis who presents with AKI and AMS  # AKI, non-oliguric, improving: Unclear cause. Renal ultrasound without hydronephrosis but enlarged kidneys.  Most likely NSAID use and Entresto contributed to AKI.  Possible intermittent hypotension. AIN from abx in differential.  Given persistent AMS (which may very well be from uremia), started IHD on 1/11 (slow start protocol). #2 treatment 1/14.  - improving  -recommend maintaining his temp dialysis catheter though hope to d/c soon - stopping fluids for now  # Altered mental status     -Likely multifactorial initially: uremia with gabapentin and cefepime contributing.  -cefepime was switched to zosyn and improvement with HD as well  - Caution with sedating meds - note gabapentin back on  # Hypernatremia -secondary to lack of free water intake - note on high dose sodium bicarbonate - will discontinue and trend - stopping fluids for now  Hypokalemia -got 40 meq K this AM - will discontinue the dose ordered for this afternoon -replete prn  Metabolic acidosis: Likely associated with AKI - stop sodium bicarb for now  Osteomyelitis: of shoulders.    Per ID and ortho  CHF/HTN: Reduced ejection fraction.  Can continue Imdur and carvedilol.  Hold Entresto for now.   - stopping fluids   Anemia - normocytic - secondary in part to renal failure - check iron panel  - aranesp 40 mcg once ordered for 09/01/20   _____________________________________________________  CC: AKI  Interval History/Subjective:  had 1.1 liters UOP over 1/16.  Last HD on 1/14.  States he's not on oxygen at home.  Has been on 3 liters this AM  Review of systems:  Has had a little shortness of breath  Denies chest pain  Some nausea; no vomiting    Medications:  Current  Facility-Administered Medications  Medication Dose Route Frequency Provider Last Rate Last Admin  . acetaminophen (TYLENOL) tablet 1,000 mg  1,000 mg Oral Q6H PRN Neena Rhymes, MD   1,000 mg at 08/23/20 0931  . albuterol (VENTOLIN HFA) 108 (90 Base) MCG/ACT inhaler 2 puff  2 puff Inhalation Q6H PRN Norins, Heinz Knuckles, MD      . ARIPiprazole (ABILIFY) tablet 5 mg  5 mg Oral Daily Norins, Heinz Knuckles, MD   5 mg at 09/01/20 0840  . aspirin EC tablet 81 mg  81 mg Oral Daily Darliss Cheney, MD   81 mg at 09/01/20 0839  . buPROPion (WELLBUTRIN XL) 24 hr tablet 150 mg  150 mg Oral Daily Darliss Cheney, MD   150 mg at 09/01/20 0840  . carvedilol (COREG) tablet 6.25 mg  6.25 mg Oral BID WC Barb Merino, MD   6.25 mg at 09/01/20 0840  . chlorhexidine (PERIDEX) 0.12 % solution 15 mL  15 mL Mouth Rinse BID Darliss Cheney, MD   15 mL at 09/01/20 0841  . Chlorhexidine Gluconate Cloth 2 % PADS 6 each  6 each Topical Daily Darliss Cheney, MD   6 each at 09/01/20 0841  . clonazePAM (KLONOPIN) disintegrating tablet 0.25 mg  0.25 mg Oral BID PRN Barb Merino, MD      . dextrose 5 % and 0.45 % NaCl with KCl 10 mEq/L infusion   Intravenous Continuous Gean Quint, MD 75 mL/hr at 08/31/20 0850 New Bag at 08/31/20 0850  . ferrous sulfate tablet 325 mg  325 mg Oral Daily  Neena Rhymes, MD   325 mg at 09/01/20 0840  . gabapentin (NEURONTIN) capsule 100 mg  100 mg Oral BID Barb Merino, MD   100 mg at 09/01/20 0840  . heparin injection 5,000 Units  5,000 Units Subcutaneous Q8H Pahwani, Einar Grad, MD   5,000 Units at 09/01/20 0545  . hydrALAZINE (APRESOLINE) injection 10 mg  10 mg Intravenous Q6H PRN Darliss Cheney, MD      . insulin aspart (novoLOG) injection 0-9 Units  0-9 Units Subcutaneous TID WC Darliss Cheney, MD   1 Units at 09/01/20 0600  . isosorbide mononitrate (IMDUR) 24 hr tablet 60 mg  60 mg Oral Daily Norins, Heinz Knuckles, MD   60 mg at 09/01/20 0840  . lidocaine (XYLOCAINE) 1 % (with pres) injection    PRN Markus Daft, MD   10 mL at 08/25/20 1632  . MEDLINE mouth rinse  15 mL Mouth Rinse q12n4p Darliss Cheney, MD   15 mL at 08/29/20 1436  . metoprolol tartrate (LOPRESSOR) injection 5 mg  5 mg Intravenous Q6H PRN Barb Merino, MD      . nitroGLYCERIN (NITROSTAT) SL tablet 0.4 mg  0.4 mg Sublingual Q5 min PRN Norins, Heinz Knuckles, MD      . pantoprazole (PROTONIX) injection 40 mg  40 mg Intravenous Q12H Darliss Cheney, MD   40 mg at 09/01/20 0841  . piperacillin-tazobactam (ZOSYN) IVPB 2.25 g  2.25 g Intravenous Q8H Pahwani, Einar Grad, MD 100 mL/hr at 09/01/20 0546 2.25 g at 09/01/20 0546  . potassium chloride SA (KLOR-CON) CR tablet 40 mEq  40 mEq Oral BID Barb Merino, MD   40 mEq at 09/01/20 0840  . Resource ThickenUp Clear   Oral PRN Darliss Cheney, MD      . rOPINIRole (REQUIP) tablet 1 mg  1 mg Oral BID Norins, Heinz Knuckles, MD   1 mg at 09/01/20 0840  . senna (SENOKOT) tablet 8.6 mg  1 tablet Oral BID Darliss Cheney, MD   8.6 mg at 09/01/20 0840  . sodium bicarbonate tablet 1,300 mg  1,300 mg Oral TID Darliss Cheney, MD   1,300 mg at 09/01/20 0843  . sodium chloride flush (NS) 0.9 % injection 10-40 mL  10-40 mL Intracatheter PRN Darliss Cheney, MD   10 mL at 08/29/20 2246  . tamsulosin (FLOMAX) capsule 0.4 mg  0.4 mg Oral BID Norins, Heinz Knuckles, MD   0.4 mg at 09/01/20 0840  . ticagrelor (BRILINTA) tablet 90 mg  90 mg Oral BID Darliss Cheney, MD   90 mg at 09/01/20 0840      Review of Systems: Neg 12-point review of systems  Physical Exam: Vitals:   09/01/20 0501 09/01/20 0724  BP: (!) 142/65 (!) 159/81  Pulse: (!) 108 (!) 115  Resp: 19 20  Temp: 99.5 F (37.5 C) 98.6 F (37 C)  SpO2: 92%    No intake/output data recorded.  Intake/Output Summary (Last 24 hours) at 09/01/2020 0905 Last data filed at 08/31/2020 1700 Gross per 24 hour  Intake 878.65 ml  Output 1100 ml  Net -221.35 ml   Constitutional: Ill-appearing, lying in bed   HEENT NCAT sclera anicteric CV: tachycardic S1S2 no  rub Respiratory: increased work of breathing with exertion; unlabored at rest; clear and reduced  Gastrointestinal: soft, non-tender Neuro: awake and interactive; thinks we are at [place] but knows year 2022 Access left IJ nontunneled HD catheter    Test Results I personally reviewed new and old clinical labs and radiology  tests Lab Results  Component Value Date   NA 146 (H) 09/01/2020   K 3.6 09/01/2020   CL 110 09/01/2020   CO2 23 09/01/2020   BUN 44 (H) 09/01/2020   CREATININE 3.39 (H) 09/01/2020   GFR 118.86 03/19/2015   GLU 116 12/01/2017   CALCIUM 8.4 (L) 09/01/2020   ALBUMIN 2.5 (L) 08/28/2020   PHOS 5.7 (H) 08/28/2020     Claudia Desanctis, MD 09/01/2020  9:27 AM

## 2020-09-01 NOTE — NC FL2 (Signed)
[place] LEVEL OF CARE SCREENING TOOL     IDENTIFICATION  Patient Name: [name] Birthdate: Jan 10, 1946 Sex: male Admission Date (Current Location): 08/22/2020  [facility] and [place] Number:      Facility and Address:  [facility] [address], [place], [place] [address]      Provider Number: [id number]  Attending Physician Name and Address:  Barb Merino, MD  Relative Name and Phone Number:       Current Level of Care: Hospital Recommended Level of Care: [place] Prior Approval Number:    Date Approved/Denied:   PASRR Number: [id number]  Discharge Plan: SNF    Current Diagnoses: Patient Active Problem List   Diagnosis Date Noted  . Encephalopathy acute   . Septic arthritis of shoulder ([place]) 08/22/2020  . Osteomyelitis of right humerus ([place]) 08/22/2020  . AKI (acute kidney injury) ([place]) 08/21/2020  . Sepsis due to group B Streptococcus ([place])   . Pyogenic arthritis of left shoulder region ([place])   . Acute pain of right shoulder   . Acute respiratory failure ([place]) 07/12/2020  . Shortness of breath   . Status post aortobifemoral bypass surgery   . History of carotid endarterectomy   . Type 2 diabetes mellitus with hyperlipidemia ([place])   . Diabetes mellitus type II, non insulin dependent ([place])   . Septic shock ([place]) 07/09/2020  . Pressure injury of skin 05/12/2020  . Amputation of right middle finger 05/12/2020  . Long term (current) use of opiate analgesic 05/07/2020  . Pain in left foot 05/07/2020  . Neck pain 05/07/2020  . Chronic systolic congestive heart failure, NYHA class 3 ([place]) 01/08/2020  . Constipation 01/07/2020  . CAD (coronary artery disease) 01/03/2020  . Hyperlipidemia associated with type 2 diabetes mellitus ([place]) 01/03/2020  . S/P coronary artery stent placement 01/03/2020  . Major depressive disorder, recurrent episode, severe, with psychotic behavior ([place]) 12/29/2019  .  Delusional disorder ([place]) 12/29/2019  . NSTEMI (non-ST elevated myocardial infarction) ([place]) 05/19/2019  . MRSA infection 01/18/2019  . Occipital neuralgia 01/18/2019  . Osteomyelitis of left foot ([place]) 01/18/2019  . Type 2 diabetes mellitus with diabetic peripheral angiopathy without gangrene, without long-term current use of insulin ([place]) 01/18/2019  . Acute on chronic diastolic congestive heart failure ([place]) 01/15/2019  . Varus foot deformity, acquired, left 11/28/2018  . Generalized anxiety disorder 07/03/2018  . Restless leg syndrome 07/03/2018  . Paranoia ([place]) 07/03/2018  . PTSD (post-traumatic stress disorder) 05/29/2018  . Abscess of right lower leg   . Severe protein-calorie malnutrition ([place])   . 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 Diabetic polyneuropathy associated with type 2 diabetes mellitus ([place])   . Cellulitis 04/01/2018  . Cellulitis of right lower extremity 04/01/2018  . Tachycardia 04/01/2018  . Hx of CABG 09/22/2015  . Unstable angina ([place]) 09/20/2015  . Chest pain, rule out acute myocardial infarction 09/18/2015  . Diabetes mellitus type 2 with peripheral artery disease ([place]) 09/18/2015  . Hyponatremia 09/18/2015  . Chest pain on exertion 09/18/2015  . Varicose veins of bilateral lower extremities with other complications 24/40/1027  . Occlusion and stenosis of carotid artery without mention of cerebral infarction 10/18/2011  . SINUSITIS, ACUTE 03/13/2010  . BACK PAIN 11/13/2009  . FATIGUE 10/17/2009  . Diabetic neuropathy associated with diabetes mellitus due to underlying condition ([place]) 08/29/2009  . Vitamin D deficiency 08/29/2009  . Hyposmolality and/or hyponatremia 08/29/2009  .  VISUAL ACUITY, DECREASED, LEFT EYE 08/29/2009  . Peripheral artery disease ([place]) 08/29/2009  . SINUSITIS, CHRONIC 08/29/2009  . NEPHROLITHIASIS 08/29/2009  . FIBROMYALGIA 08/29/2009  . BICEPS TENDON RUPTURE, RIGHT 08/11/2009  .  DIABETES MELLITUS, TYPE II 08/05/2009  . Hyperlipidemia 08/05/2009  . ANEMIA-NOS 08/05/2009  . Anxiety state 08/05/2009  . DEPRESSION 08/05/2009  . Chronic pain syndrome 08/05/2009  . Essential hypertension 08/05/2009  . GERD 08/05/2009  . BPH (benign prostatic hyperplasia) 08/05/2009  . [place] DISEASE, LUMBAR 08/05/2009  . Osteoporosis 08/05/2009    Orientation RESPIRATION BLADDER Height & Weight     Self,Time,Situation,Place  O2 (3 L) Indwelling catheter Weight: 180 lb 1.9 oz (81.7 kg) Height:  6' (182.9 cm)  BEHAVIORAL SYMPTOMS/MOOD NEUROLOGICAL BOWEL NUTRITION STATUS      Continent Diet (see d/c summary)  AMBULATORY STATUS COMMUNICATION OF NEEDS Skin   Extensive Assist Verbally Normal                       Personal Care Assistance Level of Assistance  Bathing,Feeding,Dressing Bathing Assistance: Limited assistance Feeding assistance: Independent Dressing Assistance: Limited assistance     Functional Limitations Info  Sight,Hearing,Speech Sight Info: Impaired Hearing Info: Impaired Speech Info: Impaired    SPECIAL CARE FACTORS FREQUENCY  OT (By licensed OT),PT (By licensed PT)     PT Frequency: 5x/week OT Frequency: 5x/week            Contractures Contractures Info: Not present    Additional Factors Info  Code Status,Allergies Code Status Info: Full code Allergies Info: Ivp Dye (iodinated Diagnostic Agents), sulfa antibiotics, Sulfonamide Derivatives, lisinopril, statins, Propoxyphene N-acetaminophen, metoprolol           Current Medications (09/01/2020):  This is the current hospital active medication list Current Facility-Administered Medications  Medication Dose Route Frequency Provider Last Rate Last Admin  . acetaminophen (TYLENOL) tablet 1,000 mg  1,000 mg Oral Q6H PRN Neena Rhymes, MD   1,000 mg at 08/23/20 0931  . albuterol (VENTOLIN HFA) 108 (90 Base) MCG/ACT inhaler 2 puff  2 puff Inhalation Q6H PRN Norins, Heinz Knuckles, MD      .  ARIPiprazole (ABILIFY) tablet 5 mg  5 mg Oral Daily Norins, Heinz Knuckles, MD   5 mg at 09/01/20 0840  . aspirin EC tablet 81 mg  81 mg Oral Daily Darliss Cheney, MD   81 mg at 09/01/20 0839  . buPROPion (WELLBUTRIN XL) 24 hr tablet 150 mg  150 mg Oral Daily Darliss Cheney, MD   150 mg at 09/01/20 0840  . carvedilol (COREG) tablet 12.5 mg  12.5 mg Oral BID WC Ghimire, Dante Gang, MD      . chlorhexidine (PERIDEX) 0.12 % solution 15 mL  15 mL Mouth Rinse BID Darliss Cheney, MD   15 mL at 09/01/20 0841  . Chlorhexidine Gluconate Cloth 2 % PADS 6 each  6 each Topical Daily Darliss Cheney, MD   6 each at 09/01/20 0841  . clonazePAM (KLONOPIN) disintegrating tablet 0.25 mg  0.25 mg Oral BID PRN Barb Merino, MD      . Darbepoetin Alfa (ARANESP) injection 40 mcg  40 mcg Subcutaneous Once Harrie Jeans C, MD      . ferrous sulfate tablet 325 mg  325 mg Oral Daily Norins, Heinz Knuckles, MD   325 mg at 09/01/20 0840  . gabapentin (NEURONTIN) capsule 100 mg  100 mg Oral BID Barb Merino, MD   100 mg at 09/01/20 0840  . heparin injection 5,000  Units  5,000 Units Subcutaneous Q8H Pahwani, Einar Grad, MD   5,000 Units at 09/01/20 0545  . hydrALAZINE (APRESOLINE) injection 10 mg  10 mg Intravenous Q6H PRN Darliss Cheney, MD      . insulin aspart (novoLOG) injection 0-9 Units  0-9 Units Subcutaneous TID WC Darliss Cheney, MD   1 Units at 09/01/20 0600  . isosorbide mononitrate (IMDUR) 24 hr tablet 60 mg  60 mg Oral Daily Norins, Heinz Knuckles, MD   60 mg at 09/01/20 0840  . lidocaine (XYLOCAINE) 1 % (with pres) injection    PRN Markus Daft, MD   10 mL at 08/25/20 1632  . MEDLINE mouth rinse  15 mL Mouth Rinse q12n4p Darliss Cheney, MD   15 mL at 08/29/20 1436  . metoprolol tartrate (LOPRESSOR) injection 5 mg  5 mg Intravenous Q6H PRN Barb Merino, MD      . nitroGLYCERIN (NITROSTAT) SL tablet 0.4 mg  0.4 mg Sublingual Q5 min PRN Norins, Heinz Knuckles, MD      . pantoprazole (PROTONIX) injection 40 mg  40 mg Intravenous Q12H Darliss Cheney, MD    40 mg at 09/01/20 0841  . piperacillin-tazobactam (ZOSYN) IVPB 3.375 g  3.375 g Intravenous Q8H Kris Mouton, [facility]      . Resource ThickenUp Clear   Oral PRN Darliss Cheney, MD      . rOPINIRole (REQUIP) tablet 1 mg  1 mg Oral BID Norins, Heinz Knuckles, MD   1 mg at 09/01/20 0840  . senna (SENOKOT) tablet 8.6 mg  1 tablet Oral BID Darliss Cheney, MD   8.6 mg at 09/01/20 0840  . sodium chloride flush (NS) 0.9 % injection 10-40 mL  10-40 mL Intracatheter PRN Darliss Cheney, MD   10 mL at 08/29/20 2246  . tamsulosin (FLOMAX) capsule 0.4 mg  0.4 mg Oral BID Norins, Heinz Knuckles, MD   0.4 mg at 09/01/20 0840  . ticagrelor (BRILINTA) tablet 90 mg  90 mg Oral BID Darliss Cheney, MD   90 mg at 09/01/20 0840     Discharge Medications: Please see discharge summary for a list of discharge medications.  Relevant Imaging Results:  Relevant Lab Results:   Additional Information [id number] HD is temporary; will discontinue prior to discharge  [place], LCSW

## 2020-09-02 DIAGNOSIS — M00819 Arthritis due to other bacteria, unspecified shoulder: Secondary | ICD-10-CM | POA: Diagnosis not present

## 2020-09-02 LAB — CBC WITH DIFFERENTIAL/PLATELET
Abs Immature Granulocytes: 0.02 10*3/uL (ref 0.00–0.07)
Basophils Absolute: 0.1 10*3/uL (ref 0.0–0.1)
Basophils Relative: 1 %
Eosinophils Absolute: 0.6 10*3/uL — ABNORMAL HIGH (ref 0.0–0.5)
Eosinophils Relative: 9 %
HCT: 27.3 % — ABNORMAL LOW (ref 39.0–52.0)
Hemoglobin: 8.2 g/dL — ABNORMAL LOW (ref 13.0–17.0)
Immature Granulocytes: 0 %
Lymphocytes Relative: 18 %
Lymphs Abs: 1.2 10*3/uL (ref 0.7–4.0)
MCH: 28.4 pg (ref 26.0–34.0)
MCHC: 30 g/dL (ref 30.0–36.0)
MCV: 94.5 fL (ref 80.0–100.0)
Monocytes Absolute: 0.9 10*3/uL (ref 0.1–1.0)
Monocytes Relative: 12 %
Neutro Abs: 4.1 10*3/uL (ref 1.7–7.7)
Neutrophils Relative %: 60 %
Platelets: 210 10*3/uL (ref 150–400)
RBC: 2.89 MIL/uL — ABNORMAL LOW (ref 4.22–5.81)
RDW: 18.4 % — ABNORMAL HIGH (ref 11.5–15.5)
WBC: 6.8 10*3/uL (ref 4.0–10.5)
nRBC: 0 % (ref 0.0–0.2)

## 2020-09-02 LAB — GLUCOSE, CAPILLARY
Glucose-Capillary: 115 mg/dL — ABNORMAL HIGH (ref 70–99)
Glucose-Capillary: 148 mg/dL — ABNORMAL HIGH (ref 70–99)
Glucose-Capillary: 173 mg/dL — ABNORMAL HIGH (ref 70–99)
Glucose-Capillary: 177 mg/dL — ABNORMAL HIGH (ref 70–99)

## 2020-09-02 LAB — RENAL FUNCTION PANEL
Albumin: 2.1 g/dL — ABNORMAL LOW (ref 3.5–5.0)
Anion gap: 15 (ref 5–15)
BUN: 40 mg/dL — ABNORMAL HIGH (ref 8–23)
CO2: 21 mmol/L — ABNORMAL LOW (ref 22–32)
Calcium: 8.7 mg/dL — ABNORMAL LOW (ref 8.9–10.3)
Chloride: 109 mmol/L (ref 98–111)
Creatinine, Ser: 3.36 mg/dL — ABNORMAL HIGH (ref 0.61–1.24)
GFR, Estimated: 18 mL/min — ABNORMAL LOW (ref >60)
Glucose, Bld: 191 mg/dL — ABNORMAL HIGH (ref 70–99)
Phosphorus: 4.2 mg/dL (ref 2.5–4.6)
Potassium: 3.5 mmol/L (ref 3.5–5.1)
Sodium: 145 mmol/L (ref 135–145)

## 2020-09-02 MED ORDER — PANTOPRAZOLE SODIUM 40 MG PO TBEC
40.0000 mg | DELAYED_RELEASE_TABLET | Freq: Two times a day (BID) | ORAL | Status: DC
  Administered 2020-09-02 – 2020-09-06 (×8): 40 mg via ORAL
  Filled 2020-09-02 (×7): qty 1

## 2020-09-02 NOTE — Progress Notes (Signed)
Nephrology Follow-Up Note   Assessment/Recommendations: [name] is a/an 75 y.o. male with a past medical history significant for HFrEF, DM 2, neuropathy, PAD, CAD, chronic osteomyelitis who presents with AKI and AMS  # AKI, non-oliguric, improving: Unclear cause. Renal ultrasound without hydronephrosis but enlarged kidneys.  Most likely NSAID use and Entresto contributed to AKI.  Possible intermittent hypotension. AIN from abx in differential.  Given persistent AMS (which may very well be from uremia), started IHD on 1/11 (slow start protocol). #2 treatment 1/14.  - improving; holding HD but await AM Labs  - Still has Temp HD cath, if furthe rimproved will be ok to remove - no IVFs needed  # Altered mental status     -Likely multifactorial initially: uremia with gabapentin and cefepime contributing.  -cefepime was switched to zosyn and improvement with HD as well  - Caution with sedating meds - note gabapentin back on  # Hypernatremia - stable, mild, CTM  Hypokalemia - K 3.6, CTM  Metabolic acidosis: Likely associated with AKI - no NaHCO3 needed at this time, CTM  Osteomyelitis: of shoulders.    Per ID and ortho; Zosyn  CHF/HTN: Reduced ejection fraction.  Can continue Imdur and carvedilol.  Hold Entresto for now.   - stopping fluids   Anemia - normocytic - secondary in part to renal failure and ACD - Low TSAT but will avoid Fe while treating for OM - aranesp 40 mcg once ordered for 09/01/20 - Trend   _____________________________________________________  CC: AKI  Interval History/Subjective:   No interval issues  1.1L UOP past 24h  NO AM Labs yet  Doesn't want to do HD if possible   Medications:  Current Facility-Administered Medications  Medication Dose Route Frequency Provider Last Rate Last Admin  . acetaminophen (TYLENOL) tablet 1,000 mg  1,000 mg Oral Q6H PRN Neena Rhymes, MD   1,000 mg at 08/23/20 0931  . albuterol (VENTOLIN HFA) 108  (90 Base) MCG/ACT inhaler 2 puff  2 puff Inhalation Q6H PRN Norins, Heinz Knuckles, MD      . ARIPiprazole (ABILIFY) tablet 5 mg  5 mg Oral Daily Norins, Heinz Knuckles, MD   5 mg at 09/01/20 0840  . aspirin EC tablet 81 mg  81 mg Oral Daily Darliss Cheney, MD   81 mg at 09/01/20 0839  . buPROPion (WELLBUTRIN XL) 24 hr tablet 150 mg  150 mg Oral Daily Darliss Cheney, MD   150 mg at 09/01/20 0840  . carvedilol (COREG) tablet 12.5 mg  12.5 mg Oral BID WC Barb Merino, MD   12.5 mg at 09/02/20 0851  . chlorhexidine (PERIDEX) 0.12 % solution 15 mL  15 mL Mouth Rinse BID Darliss Cheney, MD   15 mL at 09/01/20 2238  . Chlorhexidine Gluconate Cloth 2 % PADS 6 each  6 each Topical Daily Darliss Cheney, MD   6 each at 09/01/20 0841  . clonazePAM (KLONOPIN) disintegrating tablet 0.25 mg  0.25 mg Oral BID PRN Barb Merino, MD      . ferrous sulfate tablet 325 mg  325 mg Oral Daily Norins, Heinz Knuckles, MD   325 mg at 09/01/20 0840  . gabapentin (NEURONTIN) capsule 100 mg  100 mg Oral BID Barb Merino, MD   100 mg at 09/01/20 2241  . heparin injection 5,000 Units  5,000 Units Subcutaneous Q8H Darliss Cheney, MD   5,000 Units at 09/02/20 0539  . hydrALAZINE (APRESOLINE) injection 10 mg  10 mg Intravenous Q6H PRN Darliss Cheney, MD      .  insulin aspart (novoLOG) injection 0-9 Units  0-9 Units Subcutaneous TID WC Darliss Cheney, MD   2 Units at 09/01/20 1710  . isosorbide mononitrate (IMDUR) 24 hr tablet 60 mg  60 mg Oral Daily Norins, Heinz Knuckles, MD   60 mg at 09/01/20 0840  . lidocaine (XYLOCAINE) 1 % (with pres) injection    PRN Markus Daft, MD   10 mL at 08/25/20 1632  . MEDLINE mouth rinse  15 mL Mouth Rinse q12n4p Darliss Cheney, MD   15 mL at 08/29/20 1436  . metoprolol tartrate (LOPRESSOR) injection 5 mg  5 mg Intravenous Q6H PRN Barb Merino, MD      . nitroGLYCERIN (NITROSTAT) SL tablet 0.4 mg  0.4 mg Sublingual Q5 min PRN Norins, Heinz Knuckles, MD      . pantoprazole (PROTONIX) injection 40 mg  40 mg Intravenous Q12H  Darliss Cheney, MD   40 mg at 09/01/20 2238  . piperacillin-tazobactam (ZOSYN) IVPB 3.375 g  3.375 g Intravenous Q8H Kris Mouton, RPH 12.5 mL/hr at 09/02/20 0539 3.375 g at 09/02/20 0539  . Resource ThickenUp Clear   Oral PRN Darliss Cheney, MD      . rOPINIRole (REQUIP) tablet 1 mg  1 mg Oral BID Norins, Heinz Knuckles, MD   1 mg at 09/01/20 2244  . senna (SENOKOT) tablet 8.6 mg  1 tablet Oral BID Darliss Cheney, MD   8.6 mg at 09/01/20 2241  . sodium chloride flush (NS) 0.9 % injection 10-40 mL  10-40 mL Intracatheter PRN Darliss Cheney, MD   10 mL at 08/29/20 2246  . tamsulosin (FLOMAX) capsule 0.4 mg  0.4 mg Oral BID Norins, Heinz Knuckles, MD   0.4 mg at 09/01/20 2241  . ticagrelor (BRILINTA) tablet 90 mg  90 mg Oral BID Darliss Cheney, MD   90 mg at 09/01/20 2241      Review of Systems: Neg 12-point review of systems  Physical Exam: Vitals:   09/02/20 0400 09/02/20 0800  BP: 131/64 (!) 145/75  Pulse: 96 (!) 106  Resp: (!) 22 20  Temp: 98.4 F (36.9 C) 98.6 F (37 C)  SpO2: 96% 92%   Total I/O In: 240 [P.O.:240] Out: -   Intake/Output Summary (Last 24 hours) at 09/02/2020 9179 Last data filed at 09/02/2020 0800 Gross per 24 hour  Intake 271.57 ml  Output 1125 ml  Net -853.43 ml   Constitutional: lying in bed   HEENT NCAT sclera anicteric CV: tachycardic S1S2 no rub Respiratory: increased work of breathing with exertion; unlabored at rest; clear and reduced  Gastrointestinal: soft, non-tender Neuro: awake and interactive; thinks we are at [place] long but knows year 2022 Access left IJ nontunneled HD catheter    Test Results I personally reviewed new and old clinical labs and radiology tests Lab Results  Component Value Date   NA 146 (H) 09/01/2020   K 3.6 09/01/2020   CL 110 09/01/2020   CO2 23 09/01/2020   BUN 44 (H) 09/01/2020   CREATININE 3.39 (H) 09/01/2020   GFR 118.86 03/19/2015   GLU 116 12/01/2017   CALCIUM 8.4 (L) 09/01/2020   ALBUMIN 2.5 (L) 08/28/2020    PHOS 5.7 (H) 08/28/2020     Rexene Agent, MD 09/02/2020  8:52 AM

## 2020-09-02 NOTE — Plan of Care (Signed)

## 2020-09-02 NOTE — Progress Notes (Signed)
PHARMACIST - PHYSICIAN COMMUNICATION   CONCERNING: IV to Oral Route Change Policy  RECOMMENDATION: This patient is receiving protonix by the intravenous route.  Based on criteria approved by the Pharmacy and Therapeutics Committee, the intravenous medication(s) is/are being converted to the equivalent oral dose form(s).   DESCRIPTION: These criteria include:  The patient is eating (either orally or via tube) and/or has been taking other orally administered medications for a least 24 hours  The patient has no evidence of active gastrointestinal bleeding or impaired GI absorption (gastrectomy, short bowel, patient on TNA or NPO).  If you have questions about this conversion, please contact the Pharmacy Department  []  ( [phone] )  [place] []  ( [phone] )  [facility] [x]  ( [phone]  [place] []  ( [phone]  [facility] []  ( [phone] )  [facility]    Kamillah Didonato, PharmD Clinical Pharmacist **Pharmacist phone directory can now be found on [email or web address] ([email or web address]).  Listed under [facility].     

## 2020-09-02 NOTE — Progress Notes (Signed)
PROGRESS NOTE    [name]  BZM:[id number] DOB: 11/21/1945 DOA: 08/22/2020 PCP: Patrecia Pour, Christean Grief, MD    Brief Narrative:  75 year old gentleman with history of extensive medical problems, chronic systolic heart failure, type 2 diabetes with neuropathy, peripheral arterial disease and chronic shoulder problems. Admitted 11/24-12/17 for septic arthritis of both shoulders with GBS and Pseudomonas with septic shock, osteomyelitis of the right humerus.  PICC line placed and discharged home on 8 weeks of cefepime until 1/28. 1/6, seen at ID office and readmitted with renal failure and orthopedic consultation. Developed acute uremic encephalopathy, cefepime changed to Zosyn.  Underwent hemodialysis with temporary catheter on 1/11, repeat on 1/14. Orthopedics recommended conservative management.  ID recommended Zosyn until 1/28. 1/15, overnight some confusion and clearing up in the morning.MRI normal.  1/18, mental status seems improved.  Remains debilitated and frail.   palliative care and case management following for healthcare power of attorney, findings families.  Waiting for SNF bed.   Assessment & Plan:   Principal Problem:   Septic arthritis of shoulder ([place]) Active Problems:   Hyperlipidemia   Essential hypertension   BPH (benign prostatic hyperplasia)   Hyponatremia   Generalized anxiety disorder   Restless leg syndrome   Chronic systolic congestive heart failure, NYHA class 3 (HCC)   Hyperlipidemia associated with type 2 diabetes mellitus (HCC)   Diabetes mellitus type II, non insulin dependent ([place])   AKI (acute kidney injury) ([place])   Osteomyelitis of right humerus (HCC)   Encephalopathy acute  Septic arthritis of bilateral shoulder/osteomyelitis of the humerus:  Multiple procedures and aspirations.  Followed by orthopedics and they recommended antibiotic treatment.  ID on board. Antibiotics with Zosyn until 1/28.  Clinically improving. Urine cultures 1/9 with VRE,  50,000 colonies.  Not treating.  Acute renal failure: Normal renal functions on previous admission.  Came into the hospital with creatinine 4.13 which peaked to 5.3.  Multifactorial secondary to sepsis, AIN.  Developed confusion and encephalopathy and underwent hemodialysis 1/11, 1/14.  Followed by nephrology for dialysis need.  Urine output is adequate. Maintain Foley catheter. Urine output adequate. Hd catheter in place.  May remove after renal functions today.   Chronic hypernatremia: Due to renal failure.  Treated with half-normal saline.  Levels are improving. Off IV fluids. HD dialysis can come out.  Acute metabolic infective encephalopathy: Suspected secondary to uremia.  Initially suspected secondary to cefepime.  Likely consistent with uremia. Mental status mostly improved. CT head 1/9 with no acute findings. Patient does not have any focal deficits. MRI brain with no deficits.  Hypertension: Blood pressures are stable now. Heart rate control remains issue.  Increase dose of carvedilol.  He does not want to use metoprolol.  Type 2 diabetes: On sliding scale insulin.  He is on Januvia at home.  Chronic systolic heart failure: Last echocardiogram on 07/10/2020 with ejection fraction 50 to 55%.  Euvolemic.  Lasix and Entresto on hold.  Carvedilol resumed.  Increasing dose today.  History of coronary artery disease status post CABG: Patient on dual antiplatelet therapy with aspirin and Brilinta.  He is on carvedilol.  No evidence of acute coronary syndrome.  Acute hypoxemic respiratory failure: Due to fluid overload.  Using 2 to 3 L of oxygen.  Start chest physiotherapy and incentive spirometry. Today on room air.  Generalized anxiety disorder: On multiple medications that were hold.  Wellbutrin resume. Resumed the decrease dose of Klonopin and gabapentin.  Hypokalemia: Replaced aggressively and normalized.  Renal functions pending  today.  Start working with PT OT. Refer to  SNF.  Will be able to transfer once mental status improves and no more needing hemodialysis. Can transfer to medical telemetry bed.  Consult palliative care to help with healthcare power of attorney/advance care planning.   DVT prophylaxis: heparin injection 5,000 Units Start: 08/29/20 1400 Place and maintain sequential compression device Start: 08/27/20 2345   Code Status: Full code Family Communication: None.  Patient does not have any family or friends.  His son is estranged.  Case management and palliative care working with friends and family to help him out.  He will need healthcare power of attorney. Disposition Plan: Status is: Inpatient  Remains inpatient appropriate because:Inpatient level of care appropriate due to severity of illness   Dispo: The patient is from: Home              Anticipated d/c is to: Skilled nursing facility.              Anticipated d/c date is: 1 to 2 days.              Patient currently is not medically stable to d/c.  Patient will be able to go to skilled nursing facility once renal function stabilized.       Consultants:   Nephrology  Orthopedics  Infectious disease  Palliative care  Procedures:   Hemodialysis 1/11, 1/14  Antimicrobials:  Anti-infectives (From admission, onward)   Start     Dose/Rate Route Frequency Ordered Stop   09/01/20 1400  piperacillin-tazobactam (ZOSYN) IVPB 3.375 g        3.375 g 12.5 mL/hr over 240 Minutes Intravenous Every 8 hours 09/01/20 0950     08/25/20 2200  piperacillin-tazobactam (ZOSYN) IVPB 2.25 g  Status:  Discontinued        2.25 g 100 mL/hr over 30 Minutes Intravenous Every 8 hours 08/25/20 1230 09/01/20 0950   08/24/20 1800  piperacillin-tazobactam (ZOSYN) IVPB 2.25 g  Status:  Discontinued        2.25 g 100 mL/hr over 30 Minutes Intravenous Every 6 hours 08/24/20 1256 08/25/20 1230   08/24/20 1015  piperacillin-tazobactam (ZOSYN) IVPB 3.375 g  Status:  Discontinued        3.375 g 12.5  mL/hr over 240 Minutes Intravenous Every 8 hours 08/24/20 0925 08/24/20 1256   08/23/20 1000  ceFEPIme (MAXIPIME) 2 g in sodium chloride 0.9 % 100 mL IVPB  Status:  Discontinued        2 g 200 mL/hr over 30 Minutes Intravenous Every 24 hours 08/22/20 2355 08/24/20 0848   08/22/20 2245  ceFEPime (MAXIPIME) IVPB  Status:  Discontinued       Note to Pharmacy: Indication:  Septic arthritis/osteomyelitis  First Dose: No Last Day of Therapy:  09/12/2020 Labs - Once weekly:  CBC/D and BMP, Labs - Every other week:  ESR and CRP Method of administration: IV PushPharmacy to consult re: dose adjust with renal failure   2 g Intravenous Every 8 hours 08/22/20 2155 08/22/20 2354         Subjective: Patient seen and examined.  Mental status has mostly improved.  He is able to have normal conversation.  Looks overall debilitated.  He is agreeable to go to rehab.  But ultimately wants to go home. Telemetry shows sinus tachycardia and mostly acceptable heart rate with increased dose of carvedilol.  Objective: Vitals:   09/01/20 1600 09/01/20 2000 09/02/20 0400 09/02/20 0800  BP: (!) 142/85 135/65 131/64 Marland Kitchen)  145/75  Pulse: (!) 105 98 96 (!) 106  Resp: 20 19 (!) 22 20  Temp:  99.4 F (37.4 C) 98.4 F (36.9 C) 98.6 F (37 C)  TempSrc:  Oral Oral Oral  SpO2: 97% 95% 96% 92%  Weight:      Height:        Intake/Output Summary (Last 24 hours) at 09/02/2020 0923 Last data filed at 09/02/2020 0800 Gross per 24 hour  Intake 271.57 ml  Output 1125 ml  Net -853.43 ml   Filed Weights   08/22/20 2059 08/29/20 0700  Weight: 89.3 kg 81.7 kg    Examination:  General exam: Appears calm and comfortable  He is alert oriented x4.   Frail and debilitated gentleman lying in bed.  Eating breakfast. Respiratory system: Conducted airway sounds.  No wheezing or crepitations.  Today on room air.  Adequate saturation. Cardiovascular system: S1 & S2 heard, RRR. No JVD, murmurs, rubs, gallops or clicks. No pedal  edema.  Tachycardic. He has temporary catheter on the left IJ. Venous engorgement of the legs. Gastrointestinal system: Abdomen is nondistended, soft and nontender. No organomegaly or masses felt. Normal bowel sounds heard. Foley catheter with clear urine. Central nervous system: Alert and oriented x4.  No focal neurological deficits.  Moves all extremities. Extremities: Symmetric 5 x 5 power.  Generalized weakness.     Data Reviewed: I have personally reviewed following labs and imaging studies  CBC: Recent Labs  Lab 08/29/20 0320 08/30/20 0500 08/31/20 0315 09/01/20 0500 09/02/20 0500  WBC 6.7 6.3 5.3 7.1 6.8  NEUTROABS 4.0 3.6 3.1 4.6 4.1  HGB 10.2* 9.4* 8.8* 8.9* 8.2*  HCT 32.9* 29.3* 26.9* 27.9* 27.3*  MCV 91.4 91.3 92.4 92.1 94.5  PLT 258 222 204 219 756   Basic Metabolic Panel: Recent Labs  Lab 08/28/20 1039 08/29/20 0320 08/30/20 1410 08/31/20 0315 09/01/20 0500  NA 152* 151* 150* 144 146*  K 3.5 3.3* 3.2* 2.9* 3.6  CL 114* 114* 114* 110 110  CO2 20* 23 23 22 23   GLUCOSE 181* 155* 141* 136* 148*  BUN 83* 77* 55* 51* 44*  CREATININE 4.61* 4.40* 3.71* 3.54* 3.39*  CALCIUM 8.8* 8.8* 8.2* 7.9* 8.4*  MG  --   --   --   --  2.0  PHOS 5.7*  --   --   --   --    GFR: Estimated Creatinine Clearance: 21 mL/min (A) (by C-G formula based on SCr of 3.39 mg/dL (H)). Liver Function Tests: Recent Labs  Lab 08/28/20 1039  ALBUMIN 2.5*   No results for input(s): LIPASE, AMYLASE in the last 168 hours. No results for input(s): AMMONIA in the last 168 hours. Coagulation Profile: No results for input(s): INR, PROTIME in the last 168 hours. Cardiac Enzymes: No results for input(s): CKTOTAL, CKMB, CKMBINDEX, TROPONINI in the last 168 hours. BNP (last 3 results) No results for input(s): PROBNP in the last 8760 hours. HbA1C: No results for input(s): HGBA1C in the last 72 hours. CBG: Recent Labs  Lab 09/01/20 0548 09/01/20 1118 09/01/20 1534 09/01/20 2301  09/02/20 0607  GLUCAP 130* 173* 155* 159* 115*   Lipid Profile: No results for input(s): CHOL, HDL, LDLCALC, TRIG, CHOLHDL, LDLDIRECT in the last 72 hours. Thyroid Function Tests: No results for input(s): TSH, T4TOTAL, FREET4, T3FREE, THYROIDAB in the last 72 hours. Anemia Panel: Recent Labs    09/01/20 2045  FERRITIN 285  TIBC 197*  IRON 15*   Sepsis Labs: No results for  input(s): PROCALCITON, LATICACIDVEN in the last 168 hours.  Recent Results (from the past 240 hour(s))  MRSA PCR Screening     Status: None   Collection Time: 08/23/20  9:30 PM   Specimen: Nasal Mucosa; Nasopharyngeal  Result Value Ref Range Status   MRSA by PCR NEGATIVE NEGATIVE Final    Comment:        The GeneXpert MRSA Assay (FDA approved for NASAL specimens only), is one component of a comprehensive MRSA colonization surveillance program. It is not intended to diagnose MRSA infection nor to guide or monitor treatment for MRSA infections. Performed at [facility], [place] [address] [place], [place] [address]   Culture, Urine     Status: Abnormal   Collection Time: 08/24/20 12:01 AM   Specimen: Urine, Random  Result Value Ref Range Status   Specimen Description URINE, RANDOM  Final   Special Requests   Final    NONE Performed at [facility], [place] [address] [place], [place] [address]    Culture (A)  Final    50,000 COLONIES/mL VANCOMYCIN RESISTANT ENTEROCOCCUS   Report Status 08/29/2020 FINAL  Final   Organism ID, Bacteria VANCOMYCIN RESISTANT ENTEROCOCCUS (A)  Final      Susceptibility   Vancomycin resistant enterococcus - MIC*    AMPICILLIN >=32 RESISTANT Resistant     NITROFURANTOIN 128 RESISTANT Resistant     VANCOMYCIN >=32 RESISTANT Resistant     LINEZOLID 2 SENSITIVE Sensitive     * 50,000 COLONIES/mL VANCOMYCIN RESISTANT ENTEROCOCCUS         Radiology Studies: No results found.      Scheduled Meds: . ARIPiprazole  5 mg Oral Daily  . aspirin EC  81 mg Oral  Daily  . buPROPion  150 mg Oral Daily  . carvedilol  12.5 mg Oral BID WC  . chlorhexidine  15 mL Mouth Rinse BID  . Chlorhexidine Gluconate Cloth  6 each Topical Daily  . ferrous sulfate  325 mg Oral Daily  . gabapentin  100 mg Oral BID  . heparin injection (subcutaneous)  5,000 Units Subcutaneous Q8H  . insulin aspart  0-9 Units Subcutaneous TID WC  . isosorbide mononitrate  60 mg Oral Daily  . mouth rinse  15 mL Mouth Rinse q12n4p  . pantoprazole (PROTONIX) IV  40 mg Intravenous Q12H  . rOPINIRole  1 mg Oral BID  . senna  1 tablet Oral BID  . tamsulosin  0.4 mg Oral BID  . ticagrelor  90 mg Oral BID   Continuous Infusions: . piperacillin-tazobactam (ZOSYN)  IV 3.375 g (09/02/20 0539)     LOS: 11 days    Time spent: 32 minutes    Barb Merino, MD [facility] Pager [phone]

## 2020-09-02 NOTE — Progress Notes (Signed)
This chaplain is present for F/U spiritual care and discerning the Pt. healthcare decision maker.  The Pt. spoke with clarity today, remembering yesterday's conversation with the chaplain.The chaplain understands, today, the Pt. recognizes [name] as his decision maker until the Pt. can find his grandson-Matthew's phone number. The Pt. also requested time to continue to read the AD education, stating he was "only half way through the document."  The Pt. storytelling includes the theme of death. The chaplain understands the Pt. recognizes his own mortality and desires "time" to see his son's murderer prosecuted. The Pt. also talks about his father's death.    The Pt. Is actively eating his Magic Cup and accepts the chaplain's invitation for F/U spiritual care.

## 2020-09-02 NOTE — Progress Notes (Signed)
Physical Therapy Treatment Patient Details Name: [name] MRN: [id number] DOB: January 26, 1946 Today's Date: 09/02/2020    History of Present Illness 75 y.o. male with medical history significant of multiple medical problems including chronic systolic heart failure, DM with neuropathy, PAD, ASVD diffuse. He was admitted 07/09/20-08/01/20 for septic arthritis both shoulders with GBS and Pseudomonas with septic shock. He was also found to have osteomyelitis right humerus. Pt admitted on 08/22/2020 after outpatient appointment. Pt developed acute uremic encephalopathy and later underwent HDU during admission which helped improve mental status, HDU held for 2 days and encephalopathy returned.    PT Comments    Pt supine in bed on arrival this session.  Pt soiled in stool and has stool on his hand.  Pt continues to be confused.  Performed rolling from R to L to clean pt from stool incontinence.  Pt continues to required mod assistance to mobilize to edge of bed and into standing. Pt continues to require snf placement at d/c.      Follow Up Recommendations  SNF;Supervision/Assistance - 24 hour     Equipment Recommendations  Hospital bed (non emergent medical transport home.)    Recommendations for Other Services       Precautions / Restrictions Precautions Precautions: Fall Restrictions Weight Bearing Restrictions: No    Mobility  Bed Mobility Overal bed mobility: Needs Assistance Bed Mobility: Supine to Sit;Sit to Supine;Rolling Rolling: Mod assist   Supine to sit: Mod assist;HOB elevated Sit to supine: Min assist   General bed mobility comments: Mod assistance to roll to R and L to clean patient of bowel incontinence.  Pt Performed supine to sit with mod assistance to elevate trunk and advance hips to edge of bed.  Pt required assistance to return back to bed with min assistance.  He was able to lift B LEs back to bed.  Transfers Overall transfer level: Needs  assistance Equipment used: Rolling walker (2 wheeled) Transfers: Sit to/from Stand Sit to Stand: Mod assist;From elevated surface         General transfer comment: Mod assistance to boost into standing.  Pt presents with flexed hips and trunk this session.  Ambulation/Gait Ambulation/Gait assistance: Mod assist Gait Distance (Feet): 4 Feet (sidestepping to [employer] Assistive device: Rolling walker (2 wheeled) Gait Pattern/deviations: Step-to pattern     General Gait Details: Pt with flexed hips and knees.  Performed side stepping to the R this session.  Pt required increased time and effort to mobilize along edge of bed.   Stairs             Wheelchair Mobility    Modified Rankin (Stroke Patients Only)       Balance Overall balance assessment: Needs assistance Sitting-balance support: Single extremity supported;No upper extremity supported;Feet supported Sitting balance-Leahy Scale: Fair       Standing balance-Leahy Scale: Poor Standing balance comment: dependent on BUE in standing                            Cognition Arousal/Alertness: Lethargic Behavior During Therapy: Flat affect Overall Cognitive Status: Impaired/Different from baseline Area of Impairment: Attention;Memory;Following commands;Safety/judgement;Problem solving;Awareness;Orientation                 Orientation Level: Disoriented to;Place;Time;Situation Current Attention Level: Focused Memory: Decreased recall of precautions;Decreased short-term memory Following Commands: Follows one step commands with increased time Safety/Judgement: Decreased awareness of safety;Decreased awareness of deficits Awareness: Intellectual Problem Solving: Slow processing;Decreased initiation;Difficulty  sequencing;Requires verbal cues;Requires tactile cues General Comments: Pt supine in bed on arrival this session.  Reports he has not seen anyone in his room for 5 hours but he is not oriented.       Exercises Low Level/ICU Exercises Stabilized Bridging: AROM;Both;AAROM;5 reps;Supine    General Comments        Pertinent Vitals/Pain Pain Assessment: Faces Faces Pain Scale: No hurt    Home Living                      Prior Function            PT Goals (current goals can now be found in the care plan section) Acute Rehab PT Goals Patient Stated Goal: to go to [facility] Potential to Achieve Goals: Fair Additional Goals Additional Goal #1: Pt will mobilize in a manual wheelchair for 50' with use of BUEs at a supervision level Progress towards PT goals: Progressing toward goals    Frequency    Min 2X/week      PT Plan Current plan remains appropriate    Co-evaluation              AM-PAC PT "6 Clicks" Mobility   Outcome Measure  Help needed turning from your back to your side while in a flat bed without using bedrails?: A Lot Help needed moving from lying on your back to sitting on the side of a flat bed without using bedrails?: A Lot Help needed moving to and from a bed to a chair (including a wheelchair)?: A Lot Help needed standing up from a chair using your arms (e.g., wheelchair or bedside chair)?: A Lot Help needed to walk in hospital room?: A Lot Help needed climbing 3-5 steps with a railing? : Total 6 Click Score: 11    End of Session Equipment Utilized During Treatment: Gait belt Activity Tolerance: Patient tolerated treatment well Patient left: in bed;with call bell/phone within reach;with bed alarm set;with nursing/sitter in room Nurse Communication: Mobility status PT Visit Diagnosis: Unsteadiness on feet (R26.81);Other abnormalities of gait and mobility (R26.89);Muscle weakness (generalized) (M62.81)     Time: 1100-1135 PT Time Calculation (min) (ACUTE ONLY): 35 min  Charges:  $Therapeutic Activity: 23-37 mins                     [name] , PTA Acute Rehabilitation Services Pager [phone] Office [phone]     [name]  [name] 09/02/2020, 11:52 AM

## 2020-09-03 DIAGNOSIS — I5022 Chronic systolic (congestive) heart failure: Secondary | ICD-10-CM | POA: Diagnosis not present

## 2020-09-03 DIAGNOSIS — N179 Acute kidney failure, unspecified: Secondary | ICD-10-CM | POA: Diagnosis not present

## 2020-09-03 DIAGNOSIS — G9340 Encephalopathy, unspecified: Secondary | ICD-10-CM | POA: Diagnosis not present

## 2020-09-03 DIAGNOSIS — M00819 Arthritis due to other bacteria, unspecified shoulder: Secondary | ICD-10-CM | POA: Diagnosis not present

## 2020-09-03 LAB — CBC WITH DIFFERENTIAL/PLATELET
Abs Immature Granulocytes: 0.03 10*3/uL (ref 0.00–0.07)
Basophils Absolute: 0 10*3/uL (ref 0.0–0.1)
Basophils Relative: 1 %
Eosinophils Absolute: 0.6 10*3/uL — ABNORMAL HIGH (ref 0.0–0.5)
Eosinophils Relative: 9 %
HCT: 25.3 % — ABNORMAL LOW (ref 39.0–52.0)
Hemoglobin: 8 g/dL — ABNORMAL LOW (ref 13.0–17.0)
Immature Granulocytes: 1 %
Lymphocytes Relative: 15 %
Lymphs Abs: 1 10*3/uL (ref 0.7–4.0)
MCH: 29.5 pg (ref 26.0–34.0)
MCHC: 31.6 g/dL (ref 30.0–36.0)
MCV: 93.4 fL (ref 80.0–100.0)
Monocytes Absolute: 0.9 10*3/uL (ref 0.1–1.0)
Monocytes Relative: 13 %
Neutro Abs: 4 10*3/uL (ref 1.7–7.7)
Neutrophils Relative %: 61 %
Platelets: 218 10*3/uL (ref 150–400)
RBC: 2.71 MIL/uL — ABNORMAL LOW (ref 4.22–5.81)
RDW: 18.2 % — ABNORMAL HIGH (ref 11.5–15.5)
WBC: 6.5 10*3/uL (ref 4.0–10.5)
nRBC: 0 % (ref 0.0–0.2)

## 2020-09-03 LAB — GLUCOSE, CAPILLARY
Glucose-Capillary: 111 mg/dL — ABNORMAL HIGH (ref 70–99)
Glucose-Capillary: 168 mg/dL — ABNORMAL HIGH (ref 70–99)
Glucose-Capillary: 173 mg/dL — ABNORMAL HIGH (ref 70–99)
Glucose-Capillary: 186 mg/dL — ABNORMAL HIGH (ref 70–99)

## 2020-09-03 LAB — BASIC METABOLIC PANEL
Anion gap: 12 (ref 5–15)
BUN: 39 mg/dL — ABNORMAL HIGH (ref 8–23)
CO2: 22 mmol/L (ref 22–32)
Calcium: 8.5 mg/dL — ABNORMAL LOW (ref 8.9–10.3)
Chloride: 110 mmol/L (ref 98–111)
Creatinine, Ser: 3.37 mg/dL — ABNORMAL HIGH (ref 0.61–1.24)
GFR, Estimated: 18 mL/min — ABNORMAL LOW (ref >60)
Glucose, Bld: 236 mg/dL — ABNORMAL HIGH (ref 70–99)
Potassium: 3.3 mmol/L — ABNORMAL LOW (ref 3.5–5.1)
Sodium: 144 mmol/L (ref 135–145)

## 2020-09-03 LAB — SARS CORONAVIRUS 2 (TAT 6-24 HRS): SARS Coronavirus 2: NEGATIVE

## 2020-09-03 MED ORDER — POTASSIUM CHLORIDE CRYS ER 20 MEQ PO TBCR
40.0000 meq | EXTENDED_RELEASE_TABLET | Freq: Once | ORAL | Status: AC
  Administered 2020-09-03: 40 meq via ORAL
  Filled 2020-09-03: qty 2

## 2020-09-03 MED ORDER — GERHARDT'S BUTT CREAM
TOPICAL_CREAM | CUTANEOUS | Status: DC | PRN
  Filled 2020-09-03: qty 1

## 2020-09-03 NOTE — Progress Notes (Signed)
Instructed patient on procedure. HOB less than 45*. Pressure held for 5 min, no s/sx of bleeding and pressure drsg applied. Notified nurse. VU. Fran Lowes, RN VAST

## 2020-09-03 NOTE — Progress Notes (Signed)
PROGRESS NOTE    [name]  WUJ:[id number] DOB: 1946-05-02 DOA: 08/22/2020 PCP: Patrecia Pour, Christean Grief, MD    Brief Narrative:  75 year old gentleman with history of extensive medical problems, chronic systolic heart failure, type 2 diabetes with neuropathy, peripheral arterial disease and chronic shoulder problems. Admitted 11/24-12/17 for septic arthritis of both shoulders with GBS and Pseudomonas with septic shock, osteomyelitis of the right humerus.  PICC line placed and discharged home on 8 weeks of cefepime until 1/28. 1/6, seen at ID office and readmitted with renal failure and orthopedic consultation. Developed acute uremic encephalopathy, cefepime changed to Zosyn.  Underwent hemodialysis with temporary catheter on 1/11, repeat on 1/14. Orthopedics recommended conservative management.  ID recommended Zosyn until 1/28. 1/15, overnight some confusion and clearing up in the morning.MRI normal.  1/18, mental status seems improved.  Remains debilitated and frail.   palliative care and case management following for healthcare power of attorney, findings families.   Waiting for SNF bed.   Assessment & Plan:   Principal Problem:   Septic arthritis of shoulder ([place]) Active Problems:   Hyperlipidemia   Essential hypertension   BPH (benign prostatic hyperplasia)   Hyponatremia   Generalized anxiety disorder   Restless leg syndrome   Chronic systolic congestive heart failure, NYHA class 3 (HCC)   Hyperlipidemia associated with type 2 diabetes mellitus (HCC)   Diabetes mellitus type II, non insulin dependent ([place])   AKI (acute kidney injury) ([place])   Osteomyelitis of right humerus (HCC)   Encephalopathy acute  Septic arthritis of bilateral shoulder/osteomyelitis of the humerus:  Multiple procedures and aspirations.  Followed by orthopedics and they recommended antibiotic treatment.  ID on board. Antibiotics with Zosyn until 1/28.  Clinically improving. Urine cultures 1/9 with VRE,  50,000 colonies.  Not treating.  Patient does have tunnelled catheter right subclavian vein.  Will remove after completing antibiotic therapy.  Acute renal failure: Normal renal functions on previous admission.  Came into the hospital with creatinine 4.13 which peaked to 5.3.  Multifactorial secondary to sepsis, AIN.  Developed confusion and encephalopathy and underwent hemodialysis 1/11, 1/14.  Followed by nephrology for dialysis need.  Urine output is adequate. Remove Foley catheter today. Hd catheter in place.  Probably can be removed, will defer to nephrology.  Chronic hypernatremia: Due to renal failure.  Treated with half-normal saline.  Levels are improving. Off IV fluids.  Acute metabolic infective encephalopathy: Suspected secondary to uremia.  Initially suspected secondary to cefepime.  Likely consistent with uremia. Mental status mostly improved. CT head 1/9 with no acute findings. Patient does not have any focal deficits. MRI brain with no deficits.  Hypertension: Blood pressures are stable now. Heart rate control remains issue.  Increase dose of carvedilol.  He does not want to use metoprolol.  Type 2 diabetes: On sliding scale insulin.  He is on Januvia at home.  Chronic systolic heart failure: Last echocardiogram on 07/10/2020 with ejection fraction 50 to 55%.  Euvolemic.  Lasix and Entresto on hold.  Carvedilol resumed with increasing dose.  History of coronary artery disease status post CABG: Patient on dual antiplatelet therapy with aspirin and Brilinta.  He is on carvedilol.  No evidence of acute coronary syndrome.  Acute hypoxemic respiratory failure: Due to fluid overload.  Using 2 to 3 L of oxygen.  Start chest physiotherapy and incentive spirometry. Today on room air. Stabilized.  Generalized anxiety disorder: On multiple medications that were hold.  Wellbutrin resume. Resumed the decrease dose of Klonopin and  gabapentin.  Hypokalemia: Replaced aggressively and  normalized.    Start working with PT OT. Refer to SNF.  Will be able to transfer once mental status improves and no more needing hemodialysis. Can transfer to medical telemetry bed.  Consult palliative care to help with healthcare power of attorney/advance care planning.   DVT prophylaxis: heparin injection 5,000 Units Start: 08/29/20 1400 Place and maintain sequential compression device Start: 08/27/20 2345   Code Status: Full code Family Communication: None.  Patient does not have any family or friends.  His son is estranged.  Case management and palliative care working with friends and family to help him out.  He will need healthcare power of attorney. Disposition Plan: Status is: Inpatient  Remains inpatient appropriate because:Inpatient level of care appropriate due to severity of illness   Dispo: The patient is from: Home              Anticipated d/c is to: Skilled nursing facility.              Anticipated d/c date is: 1 to 2 days.              Patient currently is not medically stable to d/c.  Patient will be able to go to skilled nursing facility once renal function stabilized. Will await renal clearance.  Medically stabilizing.       Consultants:   Nephrology  Orthopedics  Infectious disease  Palliative care  Procedures:   Hemodialysis 1/11, 1/14  Antimicrobials:  Anti-infectives (From admission, onward)   Start     Dose/Rate Route Frequency Ordered Stop   09/01/20 1400  piperacillin-tazobactam (ZOSYN) IVPB 3.375 g        3.375 g 12.5 mL/hr over 240 Minutes Intravenous Every 8 hours 09/01/20 0950     08/25/20 2200  piperacillin-tazobactam (ZOSYN) IVPB 2.25 g  Status:  Discontinued        2.25 g 100 mL/hr over 30 Minutes Intravenous Every 8 hours 08/25/20 1230 09/01/20 0950   08/24/20 1800  piperacillin-tazobactam (ZOSYN) IVPB 2.25 g  Status:  Discontinued        2.25 g 100 mL/hr over 30 Minutes Intravenous Every 6 hours 08/24/20 1256 08/25/20 1230    08/24/20 1015  piperacillin-tazobactam (ZOSYN) IVPB 3.375 g  Status:  Discontinued        3.375 g 12.5 mL/hr over 240 Minutes Intravenous Every 8 hours 08/24/20 0925 08/24/20 1256   08/23/20 1000  ceFEPIme (MAXIPIME) 2 g in sodium chloride 0.9 % 100 mL IVPB  Status:  Discontinued        2 g 200 mL/hr over 30 Minutes Intravenous Every 24 hours 08/22/20 2355 08/24/20 0848   08/22/20 2245  ceFEPime (MAXIPIME) IVPB  Status:  Discontinued       Note to Pharmacy: Indication:  Septic arthritis/osteomyelitis  First Dose: No Last Day of Therapy:  09/12/2020 Labs - Once weekly:  CBC/D and BMP, Labs - Every other week:  ESR and CRP Method of administration: IV PushPharmacy to consult re: dose adjust with renal failure   2 g Intravenous Every 8 hours 08/22/20 2155 08/22/20 2354         Subjective: Patient seen and examined.  No overnight events.  He is on room air.  He is alert oriented x4. Denies any chest pain or shortness of breath.  Telemetry shows mostly rate controlled sinus rhythm.  Appetite is fair. Never had issues with urinary retention, will take out Foley catheter.  Objective: Vitals:  09/02/20 1531 09/02/20 1919 09/02/20 2346 09/03/20 0725  BP: 134/65 (!) 130/58 (!) 145/66 136/69  Pulse: 96 99 98 100  Resp: (!) 22 20 19  (!) 21  Temp: 99.3 F (37.4 C) 98.6 F (37 C) 98.1 F (36.7 C) 98.6 F (37 C)  TempSrc: Oral Oral Oral Oral  SpO2: 97% 98% 98% 90%  Weight:      Height:        Intake/Output Summary (Last 24 hours) at 09/03/2020 1038 Last data filed at 09/03/2020 0845 Gross per 24 hour  Intake 954.41 ml  Output 1850 ml  Net -895.59 ml   Filed Weights   08/22/20 2059 08/29/20 0700  Weight: 89.3 kg 81.7 kg    Examination:  General exam: Appears calm and comfortable  He is alert oriented x4.   Looks fairly comfortable.  He is slightly debilitated. Respiratory system: Conducted airway sounds.  No wheezing or crepitations.  Today on room air.  Adequate  saturation. Cardiovascular system: S1 & S2 heard, RRR. No JVD, murmurs, rubs, gallops or clicks. No pedal edema.  Tachycardic. He has temporary catheter on the left IJ. Venous engorgement of the legs. Gastrointestinal system: Abdomen is nondistended, soft and nontender. No organomegaly or masses felt. Normal bowel sounds heard. Foley catheter with clear urine.  Will remove. Central nervous system: Alert and oriented x4.  No focal neurological deficits.  Moves all extremities. Extremities: Symmetric 5 x 5 power.  Generalized weakness.     Data Reviewed: I have personally reviewed following labs and imaging studies  CBC: Recent Labs  Lab 08/30/20 0500 08/31/20 0315 09/01/20 0500 09/02/20 0500 09/03/20 0415  WBC 6.3 5.3 7.1 6.8 6.5  NEUTROABS 3.6 3.1 4.6 4.1 4.0  HGB 9.4* 8.8* 8.9* 8.2* 8.0*  HCT 29.3* 26.9* 27.9* 27.3* 25.3*  MCV 91.3 92.4 92.1 94.5 93.4  PLT 222 204 219 210 762   Basic Metabolic Panel: Recent Labs  Lab 08/28/20 1039 08/29/20 0320 08/30/20 1410 08/31/20 0315 09/01/20 0500 09/02/20 0854 09/03/20 0910  NA 152*   < > 150* 144 146* 145 144  K 3.5   < > 3.2* 2.9* 3.6 3.5 3.3*  CL 114*   < > 114* 110 110 109 110  CO2 20*   < > 23 22 23  21* 22  GLUCOSE 181*   < > 141* 136* 148* 191* 236*  BUN 83*   < > 55* 51* 44* 40* 39*  CREATININE 4.61*   < > 3.71* 3.54* 3.39* 3.36* 3.37*  CALCIUM 8.8*   < > 8.2* 7.9* 8.4* 8.7* 8.5*  MG  --   --   --   --  2.0  --   --   PHOS 5.7*  --   --   --   --  4.2  --    < > = values in this interval not displayed.   GFR: Estimated Creatinine Clearance: 21.1 mL/min (A) (by C-G formula based on SCr of 3.37 mg/dL (H)). Liver Function Tests: Recent Labs  Lab 08/28/20 1039 09/02/20 0854  ALBUMIN 2.5* 2.1*   No results for input(s): LIPASE, AMYLASE in the last 168 hours. No results for input(s): AMMONIA in the last 168 hours. Coagulation Profile: No results for input(s): INR, PROTIME in the last 168 hours. Cardiac  Enzymes: No results for input(s): CKTOTAL, CKMB, CKMBINDEX, TROPONINI in the last 168 hours. BNP (last 3 results) No results for input(s): PROBNP in the last 8760 hours. HbA1C: No results for input(s): HGBA1C in the  last 72 hours. CBG: Recent Labs  Lab 09/02/20 0607 09/02/20 1142 09/02/20 1610 09/02/20 2116 09/03/20 0606  GLUCAP 115* 148* 173* 177* 111*   Lipid Profile: No results for input(s): CHOL, HDL, LDLCALC, TRIG, CHOLHDL, LDLDIRECT in the last 72 hours. Thyroid Function Tests: No results for input(s): TSH, T4TOTAL, FREET4, T3FREE, THYROIDAB in the last 72 hours. Anemia Panel: Recent Labs    09/01/20 2045  FERRITIN 285  TIBC 197*  IRON 15*   Sepsis Labs: No results for input(s): PROCALCITON, LATICACIDVEN in the last 168 hours.  No results found for this or any previous visit (from the past 240 hour(s)).       Radiology Studies: No results found.      Scheduled Meds: . ARIPiprazole  5 mg Oral Daily  . aspirin EC  81 mg Oral Daily  . buPROPion  150 mg Oral Daily  . carvedilol  12.5 mg Oral BID WC  . chlorhexidine  15 mL Mouth Rinse BID  . Chlorhexidine Gluconate Cloth  6 each Topical Daily  . ferrous sulfate  325 mg Oral Daily  . gabapentin  100 mg Oral BID  . heparin injection (subcutaneous)  5,000 Units Subcutaneous Q8H  . insulin aspart  0-9 Units Subcutaneous TID WC  . isosorbide mononitrate  60 mg Oral Daily  . mouth rinse  15 mL Mouth Rinse q12n4p  . pantoprazole  40 mg Oral BID  . potassium chloride  40 mEq Oral Once  . rOPINIRole  1 mg Oral BID  . senna  1 tablet Oral BID  . tamsulosin  0.4 mg Oral BID  . ticagrelor  90 mg Oral BID   Continuous Infusions: . piperacillin-tazobactam (ZOSYN)  IV 3.375 g (09/03/20 0628)     LOS: 12 days    Time spent: 30 minutes    Barb Merino, MD [facility] Pager [phone]

## 2020-09-03 NOTE — Progress Notes (Signed)
Occupational Therapy Treatment Patient Details Name: [name] MRN: [id number] DOB: 06/23/1946 Today's Date: 09/03/2020    History of present illness 75 y.o. male with medical history significant of multiple medical problems including chronic systolic heart failure, DM with neuropathy, PAD, ASVD diffuse. He was admitted 07/09/20-08/01/20 for septic arthritis both shoulders with GBS and Pseudomonas with septic shock. He was also found to have osteomyelitis right humerus. Pt admitted on 08/22/2020 after outpatient appointment. Pt developed acute uremic encephalopathy and later underwent HDU during admission which helped improve mental status, HDU held for 2 days and encephalopathy returned.   OT comments  Pt making progress with functional goals, however pt became agitated while seated EOB trying to take off pulse ox, yelling at therapist and trying to kick therapist. Pt returned to supine with bed alarm activated. Per SW, pt to d/s to SNF tomorrow  Follow Up Recommendations  SNF;Supervision/Assistance - 24 hour    Equipment Recommendations   (TBD at SNF)    Recommendations for Other Services      Precautions / Restrictions Precautions Precautions: Fall Restrictions Weight Bearing Restrictions: No       Mobility Bed Mobility Overal bed mobility: Needs Assistance Bed Mobility: Supine to Sit;Sit to Supine     Supine to sit: Mod assist;HOB elevated Sit to supine: Mod assist   General bed mobility comments: mod A to elevae trunk and with LEs back onto bed. increaesed time and effiort sup - sit, redirection required to stay on task  Transfers Overall transfer level: Needs assistance Equipment used: Rolling walker (2 wheeled)             General transfer comment: did not attempt    Balance Overall balance assessment: Needs assistance Sitting-balance support: Single extremity supported;No upper extremity supported;Feet supported Sitting balance-Leahy Scale:  Fair Sitting balance - Comments: min A to min guard                                   ADL either performed or assessed with clinical judgement   ADL Overall ADL's : Needs assistance/impaired     Grooming: Wash/dry face;Wash/dry hands;Min guard;Sitting           Upper Body Dressing : Minimal assistance;Sitting Upper Body Dressing Details (indicate cue type and reason): donned clean gown wiht increased time, redirection required multiple times Lower Body Dressing: Maximal assistance Lower Body Dressing Details (indicate cue type and reason): max A to don shoes seated EOB               General ADL Comments: decreased cognition greatly impacting ability to perform ADL     Vision Patient Visual Report: No change from baseline     Perception     Praxis      Cognition Arousal/Alertness: Awake/alert Behavior During Therapy: Agitated;Flat affect Overall Cognitive Status: Impaired/Different from baseline Area of Impairment: Attention;Memory;Following commands;Safety/judgement;Problem solving;Awareness;Orientation                 Orientation Level: Disoriented to;Time;Situation   Memory: Decreased recall of precautions;Decreased short-term memory Following Commands: Follows one step commands with increased time Safety/Judgement: Decreased awareness of safety;Decreased awareness of deficits   Problem Solving: Slow processing;Decreased initiation;Difficulty sequencing;Requires verbal cues;Requires tactile cues General Comments: pt became agitated while seated EOB trying to take off pulse ox, yelling at therapist and trying to kick therapist        Exercises     Shoulder  Instructions       General Comments      Pertinent Vitals/ Pain       Pain Assessment: No/denies pain Pain Intervention(s): Monitored during session;Repositioned  Home Living                                          Prior Functioning/Environment               Frequency  Min 2X/week        Progress Toward Goals  OT Goals(current goals can now be found in the care plan section)  Progress towards OT goals: Progressing toward goals     Plan Discharge plan remains appropriate    Co-evaluation                 AM-PAC OT "6 Clicks" Daily Activity     Outcome Measure   Help from another person eating meals?: A Little Help from another person taking care of personal grooming?: A Little Help from another person toileting, which includes using toliet, bedpan, or urinal?: A Lot Help from another person bathing (including washing, rinsing, drying)?: A Lot Help from another person to put on and taking off regular upper body clothing?: A Lot Help from another person to put on and taking off regular lower body clothing?: A Lot 6 Click Score: 14    End of Session    OT Visit Diagnosis: Unsteadiness on feet (R26.81);Other abnormalities of gait and mobility (R26.89);Muscle weakness (generalized) (M62.81);Repeated falls (R29.6);History of falling (Z91.81);Other symptoms and signs involving cognitive function;Adult, failure to thrive (R62.7)   Activity Tolerance Treatment limited secondary to agitation   Patient Left in bed;with call bell/phone within reach;with bed alarm set   Nurse Communication          Time: 2902-1115 OT Time Calculation (min): 14 min  Charges: OT General Charges $OT Visit: 1 Visit OT Treatments $Self Care/Home Management : 8-22 mins     Britt Bottom 09/03/2020, 3:43 PM

## 2020-09-03 NOTE — Progress Notes (Signed)
Nephrology Follow-Up Note   Assessment/Recommendations: Brahim Dolman is a/an 75 y.o. male with a past medical history significant for HFrEF, DM 2, neuropathy, PAD, CAD, chronic osteomyelitis who presents with AKI and AMS  # AKI, non-oliguric, improving: Unclear cause. Renal ultrasound without hydronephrosis but enlarged kidneys.  Most likely NSAID use and Entresto contributed to AKI.  Possible intermittent hypotension. AIN from abx in differential.  Given persistent AMS (which may very well be from uremia), started IHD on 1/11 (slow start protocol). #2 treatment 1/14.  - improving; holding HD but await AM Labs  - Still has Temp HD cath, if furthe rimproved will be ok to remove - no IVFs needed  # Altered mental status     -Likely multifactorial initially: uremia with gabapentin and cefepime contributing.  -cefepime was switched to zosyn and improvement with HD as well  - Caution with sedating meds - note gabapentin back on  # Hypernatremia - stable, mild, CTM, resolved  Hypokalemia - K 3.6, CTM  Metabolic acidosis: Likely associated with AKI - no NaHCO3 needed at this time, CTM  Osteomyelitis: of shoulders.    Per ID and ortho; Zosyn  CHF/HTN: Reduced ejection fraction.  Can continue Imdur and carvedilol.  Hold Entresto for now.   - stopping fluids   Anemia - normocytic - secondary in part to renal failure and ACD - Low TSAT but will avoid Fe while treating for OM - aranesp 40 mcg once ordered for 09/01/20 - Trend   _____________________________________________________  CC: AKI  Interval History/Subjective:   No interval issues  1.9L UOP  AM Labs pending  Has Temp HD cath   Medications:  Current Facility-Administered Medications  Medication Dose Route Frequency Provider Last Rate Last Admin  . acetaminophen (TYLENOL) tablet 1,000 mg  1,000 mg Oral Q6H PRN Neena Rhymes, MD   1,000 mg at 08/23/20 0931  . albuterol (VENTOLIN HFA) 108 (90 Base) MCG/ACT  inhaler 2 puff  2 puff Inhalation Q6H PRN Norins, Heinz Knuckles, MD      . ARIPiprazole (ABILIFY) tablet 5 mg  5 mg Oral Daily Norins, Heinz Knuckles, MD   5 mg at 09/03/20 0851  . aspirin EC tablet 81 mg  81 mg Oral Daily Darliss Cheney, MD   81 mg at 09/03/20 0853  . buPROPion (WELLBUTRIN XL) 24 hr tablet 150 mg  150 mg Oral Daily Darliss Cheney, MD   150 mg at 09/03/20 0851  . carvedilol (COREG) tablet 12.5 mg  12.5 mg Oral BID WC Barb Merino, MD   12.5 mg at 09/03/20 4098  . chlorhexidine (PERIDEX) 0.12 % solution 15 mL  15 mL Mouth Rinse BID Darliss Cheney, MD   15 mL at 09/03/20 0852  . Chlorhexidine Gluconate Cloth 2 % PADS 6 each  6 each Topical Daily Darliss Cheney, MD   6 each at 09/03/20 0855  . clonazePAM (KLONOPIN) disintegrating tablet 0.25 mg  0.25 mg Oral BID PRN Barb Merino, MD      . ferrous sulfate tablet 325 mg  325 mg Oral Daily Norins, Heinz Knuckles, MD   325 mg at 09/03/20 0853  . gabapentin (NEURONTIN) capsule 100 mg  100 mg Oral BID Barb Merino, MD   100 mg at 09/03/20 0853  . Gerhardt's butt cream   Topical PRN Barb Merino, MD      . heparin injection 5,000 Units  5,000 Units Subcutaneous Q8H Darliss Cheney, MD   5,000 Units at 09/03/20 [phone]  . hydrALAZINE (APRESOLINE) injection  10 mg  10 mg Intravenous Q6H PRN Pahwani, Einar Grad, MD      . insulin aspart (novoLOG) injection 0-9 Units  0-9 Units Subcutaneous TID WC Darliss Cheney, MD   2 Units at 09/02/20 1712  . isosorbide mononitrate (IMDUR) 24 hr tablet 60 mg  60 mg Oral Daily Norins, Heinz Knuckles, MD   60 mg at 09/03/20 0854  . lidocaine (XYLOCAINE) 1 % (with pres) injection    PRN Markus Daft, MD   10 mL at 08/25/20 1632  . MEDLINE mouth rinse  15 mL Mouth Rinse q12n4p Darliss Cheney, MD   15 mL at 08/29/20 1436  . metoprolol tartrate (LOPRESSOR) injection 5 mg  5 mg Intravenous Q6H PRN Barb Merino, MD      . nitroGLYCERIN (NITROSTAT) SL tablet 0.4 mg  0.4 mg Sublingual Q5 min PRN Norins, Heinz Knuckles, MD      . pantoprazole  (PROTONIX) EC tablet 40 mg  40 mg Oral BID Kris Mouton, RPH   40 mg at 09/03/20 0786  . piperacillin-tazobactam (ZOSYN) IVPB 3.375 g  3.375 g Intravenous Q8H Kris Mouton, RPH 12.5 mL/hr at 09/03/20 0628 3.375 g at 09/03/20 7544  . Resource ThickenUp Clear   Oral PRN Darliss Cheney, MD      . rOPINIRole (REQUIP) tablet 1 mg  1 mg Oral BID Norins, Heinz Knuckles, MD   1 mg at 09/03/20 0853  . senna (SENOKOT) tablet 8.6 mg  1 tablet Oral BID Darliss Cheney, MD   8.6 mg at 09/03/20 0854  . sodium chloride flush (NS) 0.9 % injection 10-40 mL  10-40 mL Intracatheter PRN Darliss Cheney, MD   10 mL at 08/29/20 2246  . tamsulosin (FLOMAX) capsule 0.4 mg  0.4 mg Oral BID Neena Rhymes, MD   0.4 mg at 09/03/20 0854  . ticagrelor (BRILINTA) tablet 90 mg  90 mg Oral BID Darliss Cheney, MD   90 mg at 09/03/20 0855      Review of Systems: Neg 12-point review of systems  Physical Exam: Vitals:   09/02/20 2346 09/03/20 0725  BP: (!) 145/66 136/69  Pulse: 98 100  Resp: 19 (!) 21  Temp: 98.1 F (36.7 C) 98.6 F (37 C)  SpO2: 98% 90%   Total I/O In: 236 [P.O.:236] Out: -   Intake/Output Summary (Last 24 hours) at 09/03/2020 0942 Last data filed at 09/03/2020 0845 Gross per 24 hour  Intake 954.41 ml  Output 1850 ml  Net -895.59 ml   Constitutional: lying in bed   HEENT NCAT sclera anicteric CV: tachycardic S1S2 no rub Respiratory: increased work of breathing with exertion; unlabored at rest; clear and reduced  Gastrointestinal: soft, non-tender Neuro: awake and interactive; thinks we are at  but knows year 2022 Access left IJ nontunneled HD catheter    Test Results I personally reviewed new and old clinical labs and radiology tests Lab Results  Component Value Date   NA 145 09/02/2020   K 3.5 09/02/2020   CL 109 09/02/2020   CO2 21 (L) 09/02/2020   BUN 40 (H) 09/02/2020   CREATININE 3.36 (H) 09/02/2020   GFR 118.86 03/19/2015   GLU 116 12/01/2017   CALCIUM 8.7 (L)  09/02/2020   ALBUMIN 2.1 (L) 09/02/2020   PHOS 4.2 09/02/2020     Rexene Agent, MD 09/03/2020  9:42 AM

## 2020-09-03 NOTE — Plan of Care (Signed)

## 2020-09-03 NOTE — Progress Notes (Signed)
  Speech Language Pathology Treatment: Dysphagia  Patient Details Name: [name] MRN: [id number] DOB: Feb 25, 1946 Today's Date: 09/03/2020 Time: 2641-5830 SLP Time Calculation (min) (ACUTE ONLY): 22 min  Assessment / Plan / Recommendation Clinical Impression  Pt was seen to introduce dysphagia exercises. SLP introduced EMST, attempting to establish a baseline for use in tx. Cognitively, pt had difficulty performing consecutive repetitions even with cues, and after performing only a few repetitions (approximately 1.5 sets) he subjectively started to c/o feeling lightheaded. No overt changes were noted to pt's WOB or RR, but further trials were held. SLP also introduced effortful swallows and modified CTAR, with pt returning demonstration more easily with CTAR. With effortful swallows he often said that he had swallowed when no hyolaryngeal movement was palpated, consistent with reduced awareness noted on MBS. Pt was not able to perform the [facility] despite cues. Throughout session and intermittently during exercises, pt consumed bites of puree alternated with sips of honey thick liquids with no overt s/s of aspiration. Recommend continuing current diet and precautions with ongoing SLP f/u for trial of dysphagia tx.    HPI HPI: Pt is a 75 y.o. male presenting with septic arthritis of shoulder and AMS. He has a history of chronic systold heart failure, DM with completication of neuropathy, PAD, ASVD diffuse, and chronic shoulder problems. Pt also has a history of GERD, blindness of left eye, COPD, and a left carotid endarterectomy. He also had vocal fold surgery in 1996. 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 Pt did not follow up for further treatment.      SLP Plan  Continue with current plan of care       Recommendations  Diet recommendations: Dysphagia 2 (fine chop);Honey-thick liquid Liquids provided via:  Straw;Cup Medication Administration: Crushed with puree Supervision: Full supervision/cueing for compensatory strategies;Staff to assist with self feeding Compensations: Slow rate;Small sips/bites;Other (Comment);Follow solids with liquid Postural Changes and/or Swallow Maneuvers: Seated upright 90 degrees;Upright 30-60 min after meal                Oral Care Recommendations: Oral care BID Follow up Recommendations: Skilled Nursing facility SLP Visit Diagnosis: Dysphagia, oropharyngeal phase (R13.12) Plan: Continue with current plan of care       GO                Osie Bond., M.A. [place] Acute Rehabilitation Services Pager [phone] Office [phone]  09/03/2020, 5:09 PM

## 2020-09-04 DIAGNOSIS — Z515 Encounter for palliative care: Secondary | ICD-10-CM | POA: Diagnosis not present

## 2020-09-04 DIAGNOSIS — M00819 Arthritis due to other bacteria, unspecified shoulder: Secondary | ICD-10-CM | POA: Diagnosis not present

## 2020-09-04 DIAGNOSIS — Z7189 Other specified counseling: Secondary | ICD-10-CM | POA: Diagnosis not present

## 2020-09-04 DIAGNOSIS — G9340 Encephalopathy, unspecified: Secondary | ICD-10-CM | POA: Diagnosis not present

## 2020-09-04 DIAGNOSIS — I5022 Chronic systolic (congestive) heart failure: Secondary | ICD-10-CM | POA: Diagnosis not present

## 2020-09-04 DIAGNOSIS — N179 Acute kidney failure, unspecified: Secondary | ICD-10-CM | POA: Diagnosis not present

## 2020-09-04 LAB — BASIC METABOLIC PANEL
Anion gap: 11 (ref 5–15)
BUN: 38 mg/dL — ABNORMAL HIGH (ref 8–23)
CO2: 22 mmol/L (ref 22–32)
Calcium: 8.6 mg/dL — ABNORMAL LOW (ref 8.9–10.3)
Chloride: 110 mmol/L (ref 98–111)
Creatinine, Ser: 3.37 mg/dL — ABNORMAL HIGH (ref 0.61–1.24)
GFR, Estimated: 18 mL/min — ABNORMAL LOW (ref >60)
Glucose, Bld: 173 mg/dL — ABNORMAL HIGH (ref 70–99)
Potassium: 3.6 mmol/L (ref 3.5–5.1)
Sodium: 143 mmol/L (ref 135–145)

## 2020-09-04 LAB — CBC WITH DIFFERENTIAL/PLATELET
Abs Immature Granulocytes: 0.01 10*3/uL (ref 0.00–0.07)
Basophils Absolute: 0 10*3/uL (ref 0.0–0.1)
Basophils Relative: 1 %
Eosinophils Absolute: 0.5 10*3/uL (ref 0.0–0.5)
Eosinophils Relative: 9 %
HCT: 24.3 % — ABNORMAL LOW (ref 39.0–52.0)
Hemoglobin: 7.8 g/dL — ABNORMAL LOW (ref 13.0–17.0)
Immature Granulocytes: 0 %
Lymphocytes Relative: 18 %
Lymphs Abs: 1 10*3/uL (ref 0.7–4.0)
MCH: 29.9 pg (ref 26.0–34.0)
MCHC: 32.1 g/dL (ref 30.0–36.0)
MCV: 93.1 fL (ref 80.0–100.0)
Monocytes Absolute: 0.8 10*3/uL (ref 0.1–1.0)
Monocytes Relative: 15 %
Neutro Abs: 3.2 10*3/uL (ref 1.7–7.7)
Neutrophils Relative %: 57 %
Platelets: 218 10*3/uL (ref 150–400)
RBC: 2.61 MIL/uL — ABNORMAL LOW (ref 4.22–5.81)
RDW: 18 % — ABNORMAL HIGH (ref 11.5–15.5)
WBC: 5.6 10*3/uL (ref 4.0–10.5)
nRBC: 0 % (ref 0.0–0.2)

## 2020-09-04 LAB — GLUCOSE, CAPILLARY
Glucose-Capillary: 141 mg/dL — ABNORMAL HIGH (ref 70–99)
Glucose-Capillary: 184 mg/dL — ABNORMAL HIGH (ref 70–99)
Glucose-Capillary: 163 mg/dL — ABNORMAL HIGH (ref 70–99)
Glucose-Capillary: 149 mg/dL — ABNORMAL HIGH (ref 70–99)

## 2020-09-04 MED ORDER — GABAPENTIN 100 MG PO CAPS: 100.0000 mg | ORAL_CAPSULE | Freq: Two times a day (BID) | ORAL | Status: DC

## 2020-09-04 MED ORDER — PIPERACILLIN-TAZOBACTAM 3.375 G IVPB: 3.3750 g | Freq: Three times a day (TID) | INTRAVENOUS | 0 refills | Status: DC

## 2020-09-04 MED ORDER — INSULIN ASPART 100 UNIT/ML ~~LOC~~ SOLN: 0.0000 [IU] | Freq: Three times a day (TID) | SUBCUTANEOUS | 11 refills | Status: AC

## 2020-09-04 MED ORDER — ARIPIPRAZOLE 5 MG PO TABS: 5.0000 mg | ORAL_TABLET | Freq: Every day | ORAL | Status: DC

## 2020-09-04 MED ORDER — CARVEDILOL 12.5 MG PO TABS: 12.5000 mg | ORAL_TABLET | Freq: Two times a day (BID) | ORAL | Status: DC

## 2020-09-04 MED ORDER — CLONAZEPAM 0.5 MG PO TABS: 0.5000 mg | ORAL_TABLET | Freq: Two times a day (BID) | ORAL | 0 refills | Status: DC | PRN

## 2020-09-04 MED ORDER — PIPERACILLIN-TAZOBACTAM IV (FOR PTA / DISCHARGE USE ONLY): 3.3750 g | Freq: Three times a day (TID) | INTRAVENOUS | 0 refills | Status: DC

## 2020-09-04 MED ORDER — PANTOPRAZOLE SODIUM 40 MG PO TBEC: 40.0000 mg | DELAYED_RELEASE_TABLET | Freq: Two times a day (BID) | ORAL | Status: AC

## 2020-09-04 NOTE — Progress Notes (Signed)
Nephrology Follow-Up Note   Assessment/Recommendations: [name] is a/an 75 y.o. male with a past medical history significant for HFrEF, DM 2, neuropathy, PAD, CAD, chronic osteomyelitis who presents with AKI and AMS  # AKI, non-oliguric, improving: Unclear cause. Renal ultrasound without hydronephrosis but enlarged kidneys.  Most likely NSAID use and Entresto contributed to AKI.  Possible intermittent hypotension. AIN from abx in differential.  Given persistent AMS (which may very well be from uremia), started IHD on 1/11 (slow start protocol). #2 treatment 1/14.  - improving and now GFR is stable - further HD not needed - no further inpatient renal needs, will sign off at this time but will req outpt f/u, I will make appt at our office   # Altered mental status     -Likely multifactorial initially: uremia with gabapentin and cefepime contributing.  -cefepime was switched to zosyn and improvement with HD as well  - Caution with sedating meds - note gabapentin back on  # Hypernatremia - stable, mild, CTM, resolved  Hypokalemia - K 3.6, CTM  Metabolic acidosis: Likely associated with AKI - no NaHCO3 needed at this time, CTM  Osteomyelitis: of shoulders.    Per ID and ortho; Zosyn  CHF/HTN: Reduced ejection fraction.  Can continue Imdur and carvedilol.  Hold Entresto for now.   - stopping fluids   Anemia - normocytic - secondary in part to renal failure and ACD    _____________________________________________________  CC: AKI  Interval History/Subjective:   No interval issues  > 0.5L UOP  AM Labs stable GFR, K and HCO3 ok  Temp cath removed yesterday  Medications:  Current Facility-Administered Medications  Medication Dose Route Frequency Provider Last Rate Last Admin  . acetaminophen (TYLENOL) tablet 1,000 mg  1,000 mg Oral Q6H PRN Norins, Heinz Knuckles, MD   1,000 mg at 09/03/20 1711  . albuterol (VENTOLIN HFA) 108 (90 Base) MCG/ACT inhaler 2 puff  2 puff  Inhalation Q6H PRN Norins, Heinz Knuckles, MD      . ARIPiprazole (ABILIFY) tablet 5 mg  5 mg Oral Daily Norins, Heinz Knuckles, MD   5 mg at 09/03/20 0851  . aspirin EC tablet 81 mg  81 mg Oral Daily Darliss Cheney, MD   81 mg at 09/04/20 1111  . buPROPion (WELLBUTRIN XL) 24 hr tablet 150 mg  150 mg Oral Daily Darliss Cheney, MD   150 mg at 09/04/20 1110  . carvedilol (COREG) tablet 12.5 mg  12.5 mg Oral BID WC Barb Merino, MD   12.5 mg at 09/04/20 4270  . chlorhexidine (PERIDEX) 0.12 % solution 15 mL  15 mL Mouth Rinse BID Darliss Cheney, MD   15 mL at 09/03/20 2139  . Chlorhexidine Gluconate Cloth 2 % PADS 6 each  6 each Topical Daily Darliss Cheney, MD   6 each at 09/04/20 1121  . clonazePAM (KLONOPIN) disintegrating tablet 0.25 mg  0.25 mg Oral BID PRN Barb Merino, MD      . ferrous sulfate tablet 325 mg  325 mg Oral Daily Norins, Heinz Knuckles, MD   325 mg at 09/04/20 1111  . gabapentin (NEURONTIN) capsule 100 mg  100 mg Oral BID Barb Merino, MD   100 mg at 09/04/20 1110  . Gerhardt's butt cream   Topical PRN Barb Merino, MD      . heparin injection 5,000 Units  5,000 Units Subcutaneous Q8H Darliss Cheney, MD   5,000 Units at 09/04/20 [phone]  . hydrALAZINE (APRESOLINE) injection 10 mg  10 mg  Intravenous Q6H PRN Darliss Cheney, MD      . insulin aspart (novoLOG) injection 0-9 Units  0-9 Units Subcutaneous TID WC Darliss Cheney, MD   1 Units at 09/04/20 [phone]  . isosorbide mononitrate (IMDUR) 24 hr tablet 60 mg  60 mg Oral Daily Norins, Heinz Knuckles, MD   60 mg at 09/04/20 1111  . lidocaine (XYLOCAINE) 1 % (with pres) injection    PRN Markus Daft, MD   10 mL at 08/25/20 1632  . MEDLINE mouth rinse  15 mL Mouth Rinse q12n4p Darliss Cheney, MD   15 mL at 09/03/20 1642  . nitroGLYCERIN (NITROSTAT) SL tablet 0.4 mg  0.4 mg Sublingual Q5 min PRN Norins, Heinz Knuckles, MD      . pantoprazole (PROTONIX) EC tablet 40 mg  40 mg Oral BID Kris Mouton, RPH   40 mg at 09/04/20 1110  . piperacillin-tazobactam (ZOSYN) IVPB  3.375 g  3.375 g Intravenous Q8H Kris Mouton, RPH 12.5 mL/hr at 09/04/20 0637 3.375 g at 09/04/20 6283  . Resource ThickenUp Clear   Oral PRN Darliss Cheney, MD      . rOPINIRole (REQUIP) tablet 1 mg  1 mg Oral BID Norins, Heinz Knuckles, MD   1 mg at 09/04/20 1111  . senna (SENOKOT) tablet 8.6 mg  1 tablet Oral BID Darliss Cheney, MD   8.6 mg at 09/03/20 2138  . sodium chloride flush (NS) 0.9 % injection 10-40 mL  10-40 mL Intracatheter PRN Darliss Cheney, MD   10 mL at 08/29/20 2246  . tamsulosin (FLOMAX) capsule 0.4 mg  0.4 mg Oral BID Neena Rhymes, MD   0.4 mg at 09/04/20 1111  . ticagrelor (BRILINTA) tablet 90 mg  90 mg Oral BID Darliss Cheney, MD   90 mg at 09/04/20 1111      Review of Systems: Neg 12-point review of systems  Physical Exam: Vitals:   09/04/20 0339 09/04/20 0815  BP: 131/66 (!) 150/71  Pulse: 89 (!) 107  Resp: 15 20  Temp: 98.1 F (36.7 C) 98.2 F (36.8 C)  SpO2: 98% 94%   Total I/O In: 240 [P.O.:240] Out: -   Intake/Output Summary (Last 24 hours) at 09/04/2020 1159 Last data filed at 09/04/2020 1100 Gross per 24 hour  Intake 1545.88 ml  Output 500 ml  Net 1045.88 ml   Constitutional: lying in bed   HEENT NCAT sclera anicteric CV: tachycardic S1S2 no rub Respiratory: increased work of breathing with exertion; unlabored at rest; clear and reduced  Gastrointestinal: soft, non-tender Neuro: awake and interactive; thinks we are at [place] but knows year 2022 Access left IJ nontunneled HD catheter    Test Results I personally reviewed new and old clinical labs and radiology tests Lab Results  Component Value Date   NA 143 09/04/2020   K 3.6 09/04/2020   CL 110 09/04/2020   CO2 22 09/04/2020   BUN 38 (H) 09/04/2020   CREATININE 3.37 (H) 09/04/2020   GFR 118.86 03/19/2015   GLU 116 12/01/2017   CALCIUM 8.6 (L) 09/04/2020   ALBUMIN 2.1 (L) 09/02/2020   PHOS 4.2 09/02/2020     Rexene Agent, MD 09/04/2020  11:59 AM

## 2020-09-04 NOTE — Progress Notes (Signed)
Pharmacy Antibiotic Note  [name] is a 75 y.o. male admitted on 08/22/2020 with Bilateral septic arthritis with Group B streptococcus and Pseudomonas. Pt being followed by ID as outpt and on Cefepime which was continued inpatient (planned 8 wks course with stop date of 1/28). No debridement planned by ortho.  Pt underwent iHD last on 11/14, currently holding off for now as pt is making urine. SCr is stable ~3.3 with CrCl ~20-25 ml/min.   Plan: Continue zosyn to 3.375gm IV q8h through 1/28 Will follow renal function and clinical progress    Height: 6' (182.9 cm) Weight: 81.7 kg (180 lb 1.9 oz) IBW/kg (Calculated) : 77.6  Temp (24hrs), Avg:98.6 F (37 C), Min:98.1 F (36.7 C), Max:98.9 F (37.2 C)  Recent Labs  Lab 08/31/20 0315 09/01/20 0500 09/02/20 0500 09/02/20 0854 09/03/20 0415 09/03/20 0910 09/04/20 0024  WBC 5.3 7.1 6.8  --  6.5  --  5.6  CREATININE 3.54* 3.39*  --  3.36*  --  3.37* 3.37*    Estimated Creatinine Clearance: 21.1 mL/min (A) (by C-G formula based on SCr of 3.37 mg/dL (H)).    Allergies  Allergen Reactions  . Ivp Dye [Iodinated Diagnostic Agents]     Blood Pressure dropped----pt was pre-medicated with 13 hour prep and did fine with pre-meds--amy 03/09/11   . Sulfa Antibiotics Hives  . Sulfonamide Derivatives Hives and Itching  . Lisinopril Cough  . Metoprolol Nausea And Vomiting  . Statins Other (See Comments)    myalgias  . Propoxyphene N-Acetaminophen Other (See Comments)    Sharp pains- headache    Antimicrobials this admission: Cefepime 12/17 (Started last admission) >> 1/9 Zosyn 1/9 >>  Arrie Senate, PharmD, BCPS, [facility] Clinical Pharmacist [phone] Please check AMION for all [place] numbers 09/04/2020

## 2020-09-04 NOTE — Progress Notes (Addendum)
Physical Therapy Treatment Patient Details Name: [name] MRN: [id number] DOB: 01-24-1946 Today's Date: 09/04/2020    History of Present Illness 75 y.o. male with medical history significant of multiple medical problems including chronic systolic heart failure, DM with neuropathy, PAD, ASVD diffuse. He was admitted 07/09/20-08/01/20 for septic arthritis both shoulders with GBS and Pseudomonas with septic shock. He was also found to have osteomyelitis right humerus. Pt admitted on 08/22/2020 after outpatient appointment. Pt developed acute uremic encephalopathy and later underwent HDU during admission which helped improve mental status, HDU held for 2 days and encephalopathy returned.    PT Comments    PTA returned back to room to assist patient back to bed at nurse request.  Pt complains of pain on his bottom and requesting nurse to place barrier cream.  Continue to recommend snf at this time.     Follow Up Recommendations  SNF;Supervision/Assistance - 24 hour     Equipment Recommendations  Hospital bed (transport for return home if he refuses snf placement.)    Recommendations for Other Services       Precautions / Restrictions Precautions Precautions: Fall Restrictions Weight Bearing Restrictions: No    Mobility  Bed Mobility Overal bed mobility: Needs Assistance Bed Mobility: Supine to Sit Rolling: Mod assist   Supine to sit: Mod assist Sit to supine: Mod assist   General bed mobility comments: Mod assistance for trunk elevation to move to edge of bed.  Transfers Overall transfer level: Needs assistance Equipment used: Ambulation equipment used (sara stedy) Transfers: Sit to/from Stand Sit to Stand: Mod assist;From elevated surface Stand pivot transfers: Mod assist       General transfer comment: Cues for hand placement to pull on stedy cross bar into standing.  Pt required assistance to move into standing.  Use of bed pad to boost hips into  standing.  Ambulation/Gait        Stairs             Wheelchair Mobility    Modified Rankin (Stroke Patients Only)       Balance Overall balance assessment: Needs assistance Sitting-balance support: Single extremity supported;No upper extremity supported;Feet supported Sitting balance-Leahy Scale: Fair Sitting balance - Comments: min A to min guard   Standing balance support: Bilateral upper extremity supported Standing balance-Leahy Scale: Poor Standing balance comment: dependent on BUE in standing and heavy external assistance.                            Cognition Arousal/Alertness: Awake/alert Behavior During Therapy: Impulsive Overall Cognitive Status: Impaired/Different from baseline Area of Impairment: Orientation                 Orientation Level:  (oriented x 4) Current Attention Level: Focused Memory: Decreased recall of precautions;Decreased short-term memory Following Commands: Follows one step commands with increased time Safety/Judgement: Decreased awareness of safety;Decreased awareness of deficits Awareness: Intellectual Problem Solving: Slow processing;Decreased initiation;Difficulty sequencing;Requires verbal cues;Requires tactile cues General Comments: Pt continues with poor safety awareness but is alert and oriented x 4 this session.      Exercises      General Comments        Pertinent Vitals/Pain Pain Assessment: Faces Faces Pain Scale: Hurts little more Pain Location: generalized shoulder pain Pain Descriptors / Indicators: Discomfort Pain Intervention(s): Monitored during session;Repositioned    Home Living  Prior Function            PT Goals (current goals can now be found in the care plan section) Acute Rehab PT Goals Potential to Achieve Goals: Fair Additional Goals Additional Goal #1: Pt will mobilize in a manual wheelchair for 50' with use of BUEs at a supervision  level Progress towards PT goals: Progressing toward goals    Frequency    Min 2X/week      PT Plan Current plan remains appropriate    Co-evaluation              AM-PAC PT "6 Clicks" Mobility   Outcome Measure  Help needed turning from your back to your side while in a flat bed without using bedrails?: A Lot Help needed moving from lying on your back to sitting on the side of a flat bed without using bedrails?: A Lot Help needed moving to and from a bed to a chair (including a wheelchair)?: A Lot Help needed standing up from a chair using your arms (e.g., wheelchair or bedside chair)?: A Lot Help needed to walk in hospital room?: A Lot Help needed climbing 3-5 steps with a railing? : Total 6 Click Score: 11    End of Session Equipment Utilized During Treatment: Gait belt Activity Tolerance: Patient tolerated treatment well Patient left: with call bell/phone within reach;in chair;with chair alarm set Nurse Communication: Mobility status PT Visit Diagnosis: Unsteadiness on feet (R26.81);Other abnormalities of gait and mobility (R26.89);Muscle weakness (generalized) (M62.81)     Time: 1040-1050 PT Time Calculation (min) (ACUTE ONLY): 10 min  Charges:  $Therapeutic Activity: 8-22 mins                     [name] , PTA Acute Rehabilitation Services Pager [phone] Office [phone]     Shellee Streng [name] 09/04/2020, 11:01 AM

## 2020-09-04 NOTE — Progress Notes (Signed)
PHARMACY CONSULT NOTE FOR:  OUTPATIENT  PARENTERAL ANTIBIOTIC THERAPY (OPAT)  Indication: septic arthritis Regimen: Zosyn 3.75 IV q8h  End date: 09/12/20   IV antibiotic discharge orders are pended. To discharging provider:  please sign these orders via discharge navigator,  Select New Orders & click on the button choice - Manage This Unsigned Work.     Thank you for allowing pharmacy to be a part of this patient's care.   Arrie Senate, PharmD, BCPS, [facility] Clinical Pharmacist [phone] Please check AMION for all [place] numbers 09/04/2020

## 2020-09-04 NOTE — Progress Notes (Signed)
PROGRESS NOTE    [name]  ACZ:[id number] DOB: [birth year] DOA: 08/22/2020 PCP: Patrecia Pour, Christean Grief, MD    Brief Narrative:  75 year old gentleman with history of extensive medical problems, chronic systolic heart failure, type 2 diabetes with neuropathy, peripheral arterial disease and chronic shoulder problems. Admitted 11/24-12/17 for septic arthritis of both shoulders with GBS and Pseudomonas with septic shock, osteomyelitis of the right humerus. tunneled  PICC line placed and discharged home on 8 weeks of cefepime until 1/28.  1/6, seen at ID office and readmitted with renal failure and orthopedic consultation. Developed acute uremic encephalopathy, cefepime changed to Zosyn.  Underwent hemodialysis with temporary catheter on 1/11, repeat on 1/14. Orthopedics recommended conservative management.  ID recommended Zosyn until 1/28. 1/15, overnight some confusion and clearing up in the morning.MRI normal.  1/18, mental status improved.  Remains debilitated and frail.   palliative care and case management following for healthcare power of attorney, findings families.   Waiting for SNF bed.   Assessment & Plan:   Principal Problem:   Septic arthritis of shoulder ([place]) Active Problems:   Hyperlipidemia   Essential hypertension   BPH (benign prostatic hyperplasia)   Hyponatremia   Generalized anxiety disorder   Restless leg syndrome   Chronic systolic congestive heart failure, NYHA class 3 (HCC)   Hyperlipidemia associated with type 2 diabetes mellitus (HCC)   Diabetes mellitus type II, non insulin dependent ([place])   AKI (acute kidney injury) ([place])   Osteomyelitis of right humerus (HCC)   Encephalopathy acute  Septic arthritis of bilateral shoulder/osteomyelitis of the humerus:  Multiple procedures and aspirations.  Followed by orthopedics and they recommended antibiotic treatment.  ID on board. Antibiotics with Zosyn until 1/28.  Clinically improving. Urine cultures 1/9 with  VRE, 50,000 colonies.  Not treating.  Patient does have tunnelled catheter right subclavian vein.  Will remove after completing antibiotic therapy.  Acute renal failure: Normal renal functions on previous admission.  Came into the hospital with creatinine 4.13 which peaked to 5.3.  Multifactorial secondary to sepsis, AIN.  Developed confusion and encephalopathy and underwent hemodialysis 1/11, 1/14.  Followed by nephrology for dialysis need.  Urine output is adequate. Foley catheter removed 1/19, urinating well.  On Flomax. HD catheter removed 1/19. Renal functions are stable.  Chronic hypernatremia: Resolved.  Acute metabolic infective encephalopathy: Suspected secondary to uremia.  Initially suspected secondary to cefepime.  Likely consistent with uremia. Mental status mostly improved. CT head 1/9 with no acute findings. Patient does not have any focal deficits. MRI brain with no deficits.  Hypertension: Blood pressures are stable now. Heart rate control remains issue.  Increase dose of carvedilol.    Type 2 diabetes: On sliding scale insulin.  He is on Januvia at home.  We will keep on insulin until his renal functions normalized.  Chronic systolic heart failure: Last echocardiogram on 07/10/2020 with ejection fraction 50 to 55%.  Euvolemic.  Lasix and Entresto on hold until renal functions normalized.  Carvedilol resumed with increasing dose.  History of coronary artery disease status post CABG: Patient on dual antiplatelet therapy with aspirin and Brilinta.  He is on carvedilol.  No evidence of acute coronary syndrome.  Acute hypoxemic respiratory failure: Due to fluid overload.  chest physiotherapy and incentive spirometry. Today on room air. Stabilized.  Intermittently uses oxygen for comfort but not hypoxic.  Generalized anxiety disorder: On multiple medications that were hold.  Using Wellbutrin at home that is resumed. Decreased dose of gabapentin due to renal  failure. We  decreased dose of Klonopin to half the dose he was taking at home.  Symptoms are well controlled.  Hypokalemia: Replaced aggressively and normalized.    Start working with PT OT. Refer to SNF.  Medically stable to transfer to SNF when bed available.   DVT prophylaxis: heparin injection 5,000 Units Start: 08/29/20 1400 Place and maintain sequential compression device Start: 08/27/20 2345   Code Status: Full code Family Communication: None.  Patient has no family.  Palliative team and social worker trying to address healthcare power of attorney. Disposition Plan: Status is: Inpatient  Remains inpatient appropriate because:Inpatient level of care appropriate due to severity of illness   Dispo: The patient is from: Home              Anticipated d/c is to: Skilled nursing facility.              Anticipated d/c date is when bed available.              Patient currently is medically stable.    Consultants:   Nephrology  Orthopedics  Infectious disease  Palliative care  Procedures:   Hemodialysis 1/11, 1/14  Antimicrobials:  Anti-infectives (From admission, onward)   Start     Dose/Rate Route Frequency Ordered Stop   09/04/20 0000  piperacillin-tazobactam (ZOSYN) 3.375 GM/50ML IVPB        3.375 g Intravenous Every 8 hours 09/04/20 0829 09/12/20 2359   09/04/20 0000  piperacillin-tazobactam (ZOSYN) IVPB        3.375 g Intravenous Every 8 hours 09/04/20 1056     09/01/20 1400  piperacillin-tazobactam (ZOSYN) IVPB 3.375 g        3.375 g 12.5 mL/hr over 240 Minutes Intravenous Every 8 hours 09/01/20 0950     08/25/20 2200  piperacillin-tazobactam (ZOSYN) IVPB 2.25 g  Status:  Discontinued        2.25 g 100 mL/hr over 30 Minutes Intravenous Every 8 hours 08/25/20 1230 09/01/20 0950   08/24/20 1800  piperacillin-tazobactam (ZOSYN) IVPB 2.25 g  Status:  Discontinued        2.25 g 100 mL/hr over 30 Minutes Intravenous Every 6 hours 08/24/20 1256 08/25/20 1230   08/24/20 1015   piperacillin-tazobactam (ZOSYN) IVPB 3.375 g  Status:  Discontinued        3.375 g 12.5 mL/hr over 240 Minutes Intravenous Every 8 hours 08/24/20 0925 08/24/20 1256   08/23/20 1000  ceFEPIme (MAXIPIME) 2 g in sodium chloride 0.9 % 100 mL IVPB  Status:  Discontinued        2 g 200 mL/hr over 30 Minutes Intravenous Every 24 hours 08/22/20 2355 08/24/20 0848   08/22/20 2245  ceFEPime (MAXIPIME) IVPB  Status:  Discontinued       Note to Pharmacy: Indication:  Septic arthritis/osteomyelitis  First Dose: No Last Day of Therapy:  09/12/2020 Labs - Once weekly:  CBC/D and BMP, Labs - Every other week:  ESR and CRP Method of administration: IV PushPharmacy to consult re: dose adjust with renal failure   2 g Intravenous Every 8 hours 08/22/20 2155 08/22/20 2354         Subjective: Patient seen and examined.  No overnight events.  Slept well. No retention after removal of Foley catheter. Mental status remains stable.  Heart rate is occasionally tachycardic but sinus rhythm. Agreeable to go to rehab.  Objective: Vitals:   09/03/20 2003 09/03/20 2308 09/04/20 0339 09/04/20 0815  BP: (!) 111/54 121/60 131/66 (!) 150/71  Pulse: 85 87 89 (!) 107  Resp: _0 Temp: 98.6 F (37 C) 98.8 F (37.1 C) 98.1 F (36.7 C) 98.2 F (36.8 C)  TempSrc: Oral Oral Axillary Oral  SpO2: 98% 99% 98% 94%  Weight:      Height:        Intake/Output Summary (Last 24 hours) at 09/04/2020 1058 Last data filed at 09/04/2020 0600 Gross per 24 hour  Intake 1305.88 ml  Output 500 ml  Net 805.88 ml   Filed Weights   08/22/20 2059 08/29/20 0700  Weight: 89.3 kg 81.7 kg    Examination:  General exam: Appears calm and comfortable   frail and debilitated.  Comfortable and alert oriented x4. Respiratory system: Conducted airway sounds.  No wheezing or crepitations.  Today on room air.  Adequate saturation. Cardiovascular system: S1 & S2 heard, RRR. No JVD, murmurs, rubs, gallops or clicks. No pedal  edema.  Tachycardic. Venous engorgement of the legs. Gastrointestinal system: Abdomen is nondistended, soft and nontender. No organomegaly or masses felt. Normal bowel sounds heard. Urine is clear on condom catheter. Central nervous system: Alert and oriented x4.  No focal neurological deficits.  Moves all extremities. Extremities: Symmetric 5 x 5 power.  Generalized weakness. Tunneled right subclavian line present.    Data Reviewed: I have personally reviewed following labs and imaging studies  CBC: Recent Labs  Lab 08/31/20 0315 09/01/20 0500 09/02/20 0500 09/03/20 0415 09/04/20 0024  WBC 5.3 7.1 6.8 6.5 5.6  NEUTROABS 3.1 4.6 4.1 4.0 3.2  HGB 8.8* 8.9* 8.2* 8.0* 7.8*  HCT 26.9* 27.9* 27.3* 25.3* 24.3*  MCV 92.4 92.1 94.5 93.4 93.1  PLT 204 219 210 218 264   Basic Metabolic Panel: Recent Labs  Lab 08/31/20 0315 09/01/20 0500 09/02/20 0854 09/03/20 0910 09/04/20 0024  NA 144 146* 145 144 143  K 2.9* 3.6 3.5 3.3* 3.6  CL 110 110 109 110 110  CO2 22 23 21* 22 22  GLUCOSE 136* 148* 191* 236* 173*  BUN 51* 44* 40* 39* 38*  CREATININE 3.54* 3.39* 3.36* 3.37* 3.37*  CALCIUM 7.9* 8.4* 8.7* 8.5* 8.6*  MG  --  2.0  --   --   --   PHOS  --   --  4.2  --   --    GFR: Estimated Creatinine Clearance: 21.1 mL/min (A) (by C-G formula based on SCr of 3.37 mg/dL (H)). Liver Function Tests: Recent Labs  Lab 09/02/20 0854  ALBUMIN 2.1*   No results for input(s): LIPASE, AMYLASE in the last 168 hours. No results for input(s): AMMONIA in the last 168 hours. Coagulation Profile: No results for input(s): INR, PROTIME in the last 168 hours. Cardiac Enzymes: No results for input(s): CKTOTAL, CKMB, CKMBINDEX, TROPONINI in the last 168 hours. BNP (last 3 results) No results for input(s): PROBNP in the last 8760 hours. HbA1C: No results for input(s): HGBA1C in the last 72 hours. CBG: Recent Labs  Lab 09/03/20 0606 09/03/20 1118 09/03/20 1627 09/03/20 2130 09/04/20 0630   GLUCAP 111* 168* 173* 186* 141*   Lipid Profile: No results for input(s): CHOL, HDL, LDLCALC, TRIG, CHOLHDL, LDLDIRECT in the last 72 hours. Thyroid Function Tests: No results for input(s): TSH, T4TOTAL, FREET4, T3FREE, THYROIDAB in the last 72 hours. Anemia Panel: Recent Labs    09/01/20 2045  FERRITIN 285  TIBC 197*  IRON 15*   Sepsis Labs: No results for input(s): PROCALCITON, LATICACIDVEN in the last 168 hours.  Recent Results (  from the past 240 hour(s))  SARS CORONAVIRUS 2 (TAT 6-24 HRS) Nasopharyngeal Nasopharyngeal Swab     Status: None   Collection Time: 09/03/20  2:46 PM   Specimen: Nasopharyngeal Swab  Result Value Ref Range Status   SARS Coronavirus 2 NEGATIVE NEGATIVE Final    Comment: (NOTE) SARS-CoV-2 target nucleic acids are NOT DETECTED.  The SARS-CoV-2 RNA is generally detectable in upper and lower respiratory specimens during the acute phase of infection. Negative results do not preclude SARS-CoV-2 infection, do not rule out co-infections with other pathogens, and should not be used as the sole basis for treatment or other patient management decisions. Negative results must be combined with clinical observations, patient history, and epidemiological information. The expected result is Negative.  Fact Sheet for Patients: SugarRoll.be  Fact Sheet for Healthcare Providers: https://www.woods-mathews.com/  This test is not yet approved or cleared by the [place] FDA and  has been authorized for detection and/or diagnosis of SARS-CoV-2 by FDA under an Emergency Use Authorization (EUA). This EUA will remain  in effect (meaning this test can be used) for the duration of the COVID-19 declaration under Se ction 564(b)(1) of the Act, 21 U.S.C. section 360bbb-3(b)(1), unless the authorization is terminated or revoked sooner.  Performed at [facility], [place] [address], [place], [place] [address]           Radiology Studies: No results found.      Scheduled Meds: . ARIPiprazole  5 mg Oral Daily  . aspirin EC  81 mg Oral Daily  . buPROPion  150 mg Oral Daily  . carvedilol  12.5 mg Oral BID WC  . chlorhexidine  15 mL Mouth Rinse BID  . Chlorhexidine Gluconate Cloth  6 each Topical Daily  . ferrous sulfate  325 mg Oral Daily  . gabapentin  100 mg Oral BID  . heparin injection (subcutaneous)  5,000 Units Subcutaneous Q8H  . insulin aspart  0-9 Units Subcutaneous TID WC  . isosorbide mononitrate  60 mg Oral Daily  . mouth rinse  15 mL Mouth Rinse q12n4p  . pantoprazole  40 mg Oral BID  . rOPINIRole  1 mg Oral BID  . senna  1 tablet Oral BID  . tamsulosin  0.4 mg Oral BID  . ticagrelor  90 mg Oral BID   Continuous Infusions: . piperacillin-tazobactam (ZOSYN)  IV 3.375 g (09/04/20 1308)     LOS: 13 days    Time spent: 30 minutes    Barb Merino, MD [facility] Pager [phone]

## 2020-09-04 NOTE — Plan of Care (Signed)

## 2020-09-04 NOTE — Progress Notes (Signed)
Palliative:   [name] is lying quietly in bed.  He greets me making and somewhat keeping eye contact.  He is alert and oriented x3, but has his genitals exposed.  He tells me that he is looking for his pants.  He tells me that he is going to go to short-term rehab, but is unsure of when.  He tells me that his goal is to get up, get dressed, spend time in the chair today.  We talked about healthcare power of attorney.  At this point, [name] continues to defer decision-making.  He points to the power of attorney paperwork on the windowsill stating that he has not completed this document.  I share with [name] that he does not have to complete this document in order to share his wishes.  We talk about having his grandson as his surrogate [name].  [name] states that we cannot force him to do it.  I share that indeed, we cannot force anyone to make choices for other people.  That is why it is so important for [name] to tell us what he does and does not want related to his care, and share these wishes also with his healthcare surrogate.  We talked about CODE STATUS.  Again, [name] is unable to participate fully in a meaningful conversation.  He continues to defer and deflect.  Call to longtime friend, [name].  [name] shares that she has been unable to find a contact number for grandson [name].  She also shares that [name] never sees his grandson, they do not have a close relationship.  [name] states that her residence is here in [name], she is available to continue providing care for [name].  She states that she is also willing to make healthcare choices if he is unable.  Conference with attending, bedside nursing staff, physical therapy, transition of care team, chaplain services related to patient condition, needs, goals of care.  HCPOA: Son, [name], listed in chart declines to be healthcare surrogate.  [name] names his grandson [name] as his surrogate, but has no way  to reach [name].  Longtime friend, [name] seems is willing to be healthcare surrogate if necessary, she is [name]'s second choice.  [name] states she lives in [place].     Plan: At this point continue full scope/full code.  Short-term rehab.  Would benefit from outpatient palliative services to follow for further goals of care discussions and discussions relating to healthcare surrogacy.  66 minutes  Quinn Axe, NP Palliative Medicine Team  Team Phone [phone] Greater than 50% of this time was spent counseling and coordinating care related to the above assessment and plan.

## 2020-09-04 NOTE — Progress Notes (Signed)
Physical Therapy Treatment Patient Details Name: [name] MRN: [id number] DOB: [birth year] Today's Date: 09/04/2020    History of Present Illness 75 y.o. male with medical history significant of multiple medical problems including chronic systolic heart failure, DM with neuropathy, PAD, ASVD diffuse. He was admitted 07/09/20-08/01/20 for septic arthritis both shoulders with GBS and Pseudomonas with septic shock. He was also found to have osteomyelitis right humerus. Pt admitted on 08/22/2020 after outpatient appointment. Pt developed acute uremic encephalopathy and later underwent HDU during admission which helped improve mental status, HDU held for 2 days and encephalopathy returned.    PT Comments    Pt supine in bed on arrival this session.  Pt requesting need to use bed side commode.  Pt very unsteady in standing and during steps from surface to surface.  Pt continues to require mod assistance and remains to benefit from skilled nursing placement at d/c.      Follow Up Recommendations  SNF;Supervision/Assistance - 24 hour     Equipment Recommendations  Hospital bed (transport for return home if he refuses snf placement.)    Recommendations for Other Services       Precautions / Restrictions Precautions Precautions: Fall Restrictions Weight Bearing Restrictions: No    Mobility  Bed Mobility Overal bed mobility: Needs Assistance Bed Mobility: Supine to Sit     Supine to sit: Mod assist     General bed mobility comments: Mod assistance for trunk elevation to move to edge of bed.  Transfers Overall transfer level: Needs assistance Equipment used: Rolling walker (2 wheeled) Transfers: Sit to/from Stand Sit to Stand: Mod assist;From elevated surface         General transfer comment: Cues for hand placement to and from seated surface.  Pt holding to RW to return to seated surface.  Tiliting RW this session.  Ambulation/Gait Ambulation/Gait assistance: Mod  assist;+2 safety/equipment Gait Distance (Feet): 4 Feet (from bed to bed side commode.  additional trial of 8 ft.) Assistive device: Rolling walker (2 wheeled) Gait Pattern/deviations: Step-to pattern;Antalgic;Trunk flexed     General Gait Details: Cues for sequencing and uppper trunk control.  Presents with flexed knees and hips and fatigues very quickly with short bout of gt training to door.   Stairs             Wheelchair Mobility    Modified Rankin (Stroke Patients Only)       Balance Overall balance assessment: Needs assistance Sitting-balance support: Single extremity supported;No upper extremity supported;Feet supported Sitting balance-Leahy Scale: Fair Sitting balance - Comments: min A to min guard   Standing balance support: Bilateral upper extremity supported Standing balance-Leahy Scale: Poor Standing balance comment: dependent on BUE in standing and heavy external assistance.                            Cognition Arousal/Alertness: Awake/alert Behavior During Therapy: Impulsive Overall Cognitive Status: Impaired/Different from baseline Area of Impairment: Orientation (A&O x 4)                 Orientation Level:  (oriented x 4 this session.)   Memory: Decreased recall of precautions;Decreased short-term memory Following Commands: Follows one step commands with increased time Safety/Judgement: Decreased awareness of safety;Decreased awareness of deficits   Problem Solving: Slow processing;Decreased initiation;Difficulty sequencing;Requires verbal cues;Requires tactile cues General Comments: Pt continues with poor safety awareness but is alert and oriented x 4 this session.      Exercises  General Comments        Pertinent Vitals/Pain Pain Assessment: Faces Faces Pain Scale: Hurts little more Pain Location: generalized shoulder pain Pain Descriptors / Indicators: Discomfort Pain Intervention(s): Monitored during  session;Repositioned    Home Living                      Prior Function            PT Goals (current goals can now be found in the care plan section) Acute Rehab PT Goals Potential to Achieve Goals: Fair Additional Goals Additional Goal #1: Pt will mobilize in a manual wheelchair for 50' with use of BUEs at a supervision level Progress towards PT goals: Progressing toward goals    Frequency    Min 2X/week      PT Plan Current plan remains appropriate    Co-evaluation              AM-PAC PT "6 Clicks" Mobility   Outcome Measure  Help needed turning from your back to your side while in a flat bed without using bedrails?: A Lot Help needed moving from lying on your back to sitting on the side of a flat bed without using bedrails?: A Lot Help needed moving to and from a bed to a chair (including a wheelchair)?: A Lot Help needed standing up from a chair using your arms (e.g., wheelchair or bedside chair)?: A Lot Help needed to walk in hospital room?: A Lot Help needed climbing 3-5 steps with a railing? : Total 6 Click Score: 11    End of Session Equipment Utilized During Treatment: Gait belt Activity Tolerance: Patient tolerated treatment well Patient left: with call bell/phone within reach;in chair;with chair alarm set Nurse Communication: Mobility status PT Visit Diagnosis: Unsteadiness on feet (R26.81);Other abnormalities of gait and mobility (R26.89);Muscle weakness (generalized) (M62.81)     Time: 5670-1410 PT Time Calculation (min) (ACUTE ONLY): 31 min  Charges:  $Gait Training: 8-22 mins $Therapeutic Activity: 8-22 mins                     [name] , PTA Acute Rehabilitation Services Pager [phone] Office [phone]     Talton Delpriore [name] 09/04/2020, 10:33 AM

## 2020-09-05 ENCOUNTER — Inpatient Hospital Stay (HOSPITAL_COMMUNITY): Payer: Medicare Other

## 2020-09-05 DIAGNOSIS — E119 Type 2 diabetes mellitus without complications: Secondary | ICD-10-CM | POA: Diagnosis not present

## 2020-09-05 DIAGNOSIS — I5022 Chronic systolic (congestive) heart failure: Secondary | ICD-10-CM | POA: Diagnosis not present

## 2020-09-05 DIAGNOSIS — M00819 Arthritis due to other bacteria, unspecified shoulder: Secondary | ICD-10-CM | POA: Diagnosis not present

## 2020-09-05 DIAGNOSIS — N179 Acute kidney failure, unspecified: Secondary | ICD-10-CM | POA: Diagnosis not present

## 2020-09-05 LAB — CBC WITH DIFFERENTIAL/PLATELET
Abs Immature Granulocytes: 0.04 10*3/uL (ref 0.00–0.07)
Basophils Absolute: 0 10*3/uL (ref 0.0–0.1)
Basophils Relative: 1 %
Eosinophils Absolute: 0.1 10*3/uL (ref 0.0–0.5)
Eosinophils Relative: 2 %
HCT: 28 % — ABNORMAL LOW (ref 39.0–52.0)
Hemoglobin: 8.8 g/dL — ABNORMAL LOW (ref 13.0–17.0)
Immature Granulocytes: 1 %
Lymphocytes Relative: 12 %
Lymphs Abs: 0.8 10*3/uL (ref 0.7–4.0)
MCH: 29.2 pg (ref 26.0–34.0)
MCHC: 31.4 g/dL (ref 30.0–36.0)
MCV: 93 fL (ref 80.0–100.0)
Monocytes Absolute: 0.9 10*3/uL (ref 0.1–1.0)
Monocytes Relative: 14 %
Neutro Abs: 4.9 10*3/uL (ref 1.7–7.7)
Neutrophils Relative %: 70 %
Platelets: 279 10*3/uL (ref 150–400)
RBC: 3.01 MIL/uL — ABNORMAL LOW (ref 4.22–5.81)
RDW: 17.6 % — ABNORMAL HIGH (ref 11.5–15.5)
WBC: 6.8 10*3/uL (ref 4.0–10.5)
nRBC: 0 % (ref 0.0–0.2)

## 2020-09-05 LAB — BASIC METABOLIC PANEL
Anion gap: 15 (ref 5–15)
BUN: 35 mg/dL — ABNORMAL HIGH (ref 8–23)
CO2: 21 mmol/L — ABNORMAL LOW (ref 22–32)
Calcium: 8.7 mg/dL — ABNORMAL LOW (ref 8.9–10.3)
Chloride: 106 mmol/L (ref 98–111)
Creatinine, Ser: 3.27 mg/dL — ABNORMAL HIGH (ref 0.61–1.24)
GFR, Estimated: 19 mL/min — ABNORMAL LOW (ref >60)
Glucose, Bld: 154 mg/dL — ABNORMAL HIGH (ref 70–99)
Potassium: 3.7 mmol/L (ref 3.5–5.1)
Sodium: 142 mmol/L (ref 135–145)

## 2020-09-05 LAB — GLUCOSE, CAPILLARY
Glucose-Capillary: 148 mg/dL — ABNORMAL HIGH (ref 70–99)
Glucose-Capillary: 179 mg/dL — ABNORMAL HIGH (ref 70–99)
Glucose-Capillary: 275 mg/dL — ABNORMAL HIGH (ref 70–99)

## 2020-09-05 MED ORDER — DM-GUAIFENESIN ER 30-600 MG PO TB12
1.0000 | ORAL_TABLET | Freq: Two times a day (BID) | ORAL | Status: DC
  Administered 2020-09-05 – 2020-09-06 (×4): 1 via ORAL
  Filled 2020-09-05 (×3): qty 1

## 2020-09-05 MED ORDER — FUROSEMIDE 10 MG/ML IJ SOLN
40.0000 mg | Freq: Once | INTRAMUSCULAR | Status: AC
  Administered 2020-09-05: 40 mg via INTRAVENOUS
  Filled 2020-09-05: qty 4

## 2020-09-05 NOTE — TOC Transition Note (Addendum)
Transition of Care [facility]) - CM/SW Discharge Note   Patient Details  Name: [name] MRN: [id number] Date of Birth: Jul 12, 1946  Transition of Care [facility]) CM/SW Contact:  Bethann Berkshire, [place] Phone Number: 09/05/2020, 2:18 PM   Clinical Narrative:     CSW spoke with pt; he is agreeable for palliative referral. CSW made referral to [place].   Patient will DC to: [facility] Anticipated DC date: 09/05/20 Transport by: Corey Harold  Per MD patient ready for DC to  [facility]. RN, patient,and facility notified of DC. Discharge Summary and FL2 sent to facility. RN to call report prior to discharge [phone]). DC packet on chart. Ambulance transport requested for patient.   CSW will sign off for now as social work intervention is no longer needed. Please consult [place] again if new needs arise.   Final next level of care: Skilled Nursing Facility Barriers to Discharge: No Barriers Identified   Patient Goals and CMS Choice Patient states their goals for this hospitalization and ongoing recovery are:: "I guess I gotta go there before I can go home" CMS Medicare.gov Compare Post Acute Care list provided to:: Patient Choice offered to / list presented to : Patient  Discharge Placement              Patient chooses bed at:  [facility]) Patient to be transferred to facility by: [facility]      Discharge Plan and Services                                     Social Determinants of Health (SDOH) Interventions     Readmission Risk Interventions No flowsheet data found.

## 2020-09-05 NOTE — Progress Notes (Signed)
Called [facility] and gave Catalina Lunger, RN report on patient. Stated he was going to room 125B. After report AnnMarie stated she had no further questions. Patient will be transported via [facility] as arranged,

## 2020-09-05 NOTE — Progress Notes (Signed)
  Speech Language Pathology Treatment: Dysphagia  Patient Details Name: [name] MRN: [id number] DOB: 1946/01/23 Today's Date: 09/05/2020 Time: 7711-6579 SLP Time Calculation (min) (ACUTE ONLY): 19 min  Assessment / Plan / Recommendation Clinical Impression  Attempted to perform EMST exercises with pt again this morning, but he appears to be a little more confused and he has more trouble generating enough pressure to blow through the trainer. SLP adjusted it to the lowest setting, at which he performed 5 repetitions. After just this one set, pt reported feeling "swimmy headed" and therefore trials were ceased. Pt did want to eat some of his breakfast, although only the purees and honey thick liquids. SLP assisted with alternating textures to increase safety, which also seemed to help him swallow more consistently with pureed boluses. Min verbal cues were needed to swallow otherwise. Recommend continuing current diet and trial of pharyngeal strengthening.    HPI HPI: Pt is a 75 y.o. male presenting with septic arthritis of shoulder and AMS. He has a history of chronic systold heart failure, DM with completication of neuropathy, PAD, ASVD diffuse, and chronic shoulder problems. Pt also has a history of GERD, blindness of left eye, COPD, and a left carotid endarterectomy. He also had vocal fold surgery in 1996. 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 Pt did not follow up for further treatment.      SLP Plan  Continue with current plan of care       Recommendations  Diet recommendations: Dysphagia 2 (fine chop) Liquids provided via: Straw;Cup Medication Administration: Crushed with puree Supervision: Full supervision/cueing for compensatory strategies;Staff to assist with self feeding Compensations: Slow rate;Small sips/bites;Other (Comment);Follow solids with liquid Postural Changes and/or Swallow Maneuvers: Seated  upright 90 degrees;Upright 30-60 min after meal                Oral Care Recommendations: Oral care BID Follow up Recommendations: Skilled Nursing facility SLP Visit Diagnosis: Dysphagia, oropharyngeal phase (R13.12) Plan: Continue with current plan of care       GO                Osie Bond., M.A. [facility] Pager [phone] Office [phone]  09/05/2020, 9:58 AM

## 2020-09-05 NOTE — Discharge Summary (Signed)
Physician Discharge Summary  [name] MWN:[id number] DOB: 10/05/1945 DOA: 08/22/2020  PCP: Doreatha Lew, MD  Admit date: 08/22/2020 Discharge date: 09/05/2020  Admitted From: Home Disposition: Skilled nursing facility  Recommendations for Outpatient Follow-up:  1. Follow up with PCP in 1-2 weeks after discharge 2. Please obtain BMP/CBC in one week 3. If abnormal renal functions, schedule follow-up with nephrology. 4. Please consult palliative care team while at a skilled nursing facility.  Home Health: Not applicable Equipment/Devices: Not applicable  Discharge Condition: Stable CODE STATUS: Full code Diet recommendation: Low-salt low-carb diet.  Dysphagia 2 diet with aspiration precautions.  Discharge summary:  75 year old gentleman with history of extensive medical problems, chronic systolic heart failure, type 2 diabetes with neuropathy, peripheral arterial disease and chronic shoulder problems. Admitted 11/24-12/17 for septic arthritis of both shoulders with GBS and Pseudomonas with septic shock, osteomyelitis of the right humerus. tunneled  PICC line placed and discharged home on 8 weeks of cefepime until 1/28.  1/6, seen at ID office and readmitted with renal failure and orthopedic consultation. Developed acute uremic encephalopathy, cefepime changed to Zosyn.  Underwent hemodialysis with temporary catheter on 1/11, repeat on 1/14. Orthopedics recommended conservative management.  ID recommended Zosyn until 1/28. 1/15, overnight some confusion and clearing up in the morning.MRI normal.  1/18, mental status improved.  Remains debilitated and frail.   palliative care and case management following for healthcare power of attorney, findings families.     Assessment & Plan of care:   # Septic arthritis of bilateral shoulder/osteomyelitis of the humerus:  Multiple procedures and aspirations.  Followed by orthopedics and they recommended antibiotic treatment.  ID on  board. Antibiotics with Zosyn until 1/28.  Clinically improving. Urine cultures 1/9 with VRE, 50,000 colonies.  Not treating.  Patient does have tunnelled catheter right subclavian vein.  Please refer to interventional radiology after completing antibiotic therapy for removal of catheter.  # Acute renal failure: Hypernatremia resolved.  Normal renal functions on previous admission.  Came into the hospital with creatinine 4.13 which peaked to 5.3.  Multifactorial secondary to sepsis, AIN.  Developed confusion and encephalopathy and underwent hemodialysis 1/11, 1/14.  Followed by nephrology for dialysis need.  Urine output is adequate. Foley catheter removed 1/19, urinating well.  On Flomax. HD catheter removed 1/19. Renal functions are stable. Recheck in 1 week to ensure stabilization.  # Acute metabolic infective encephalopathy: Suspected secondary to uremia.  Initially suspected secondary to cefepime.  Likely consistent with uremia. Mental status mostly improved. CT head 1/9 with no acute findings. Patient does not have any focal deficits. MRI brain with no deficits.  # Hypertension: Blood pressures are stable now. Heart rate control remains issue.  Increase dose of carvedilol.    # Type 2 diabetes: On sliding scale insulin.  He is on Januvia at home.  We will keep on insulin until his renal functions normalized.  # Chronic systolic heart failure: Last echocardiogram on 07/10/2020 with ejection fraction 50 to 55%.  Euvolemic.  Lasix and Entresto on hold until renal functions normalized.  Carvedilol resumed with increasing dose.  # History of coronary artery disease status post CABG: Patient on dual antiplatelet therapy with aspirin and Brilinta.  He is on carvedilol.  No evidence of acute coronary syndrome.  # Acute hypoxemic respiratory failure: Due to fluid overload.  chest physiotherapy and incentive spirometry. Today on room air. Stabilized.  Intermittently uses oxygen for comfort  but not hypoxic.  # Generalized anxiety disorder: On multiple medications  that were hold.  Using Wellbutrin at home that is resumed. Decreased dose of gabapentin due to renal failure. Decreased dose of Klonopin to have that he was taking at home.  Symptoms are well controlled.      Patient is medically stable today.  He continues to have medical issues, hospitalization.  Patient has no family.  He does not have any healthcare power of attorney.  Palliative care team and social worker were following him in the hospital, he did not come to any conclusion.  He only has a caregiver locally.  He is estranged to his son. Please continue to follow-up discussion, palliative care consultation at skilled nursing facility to help him with healthcare power of attorney and discuss goal of care.  Discharge Diagnoses:  Principal Problem:   Septic arthritis of shoulder ([place]) Active Problems:   Hyperlipidemia   Essential hypertension   BPH (benign prostatic hyperplasia)   Hyponatremia   Generalized anxiety disorder   Restless leg syndrome   Chronic systolic congestive heart failure, NYHA class 3 (HCC)   Hyperlipidemia associated with type 2 diabetes mellitus (H[facility])   Diabetes mellitus type II, non insulin dependent ([place])   AKI (acute kidney injury) ([place])   Osteomyelitis of right humerus ([place])   Encephalopathy acute    Discharge Instructions  Discharge Instructions    [facility] pharmacist to adjust dose for Vancomycin, Aminoglycosides and other anti-infective therapies as requested by physician.   Complete by: As directed    [facility] pharmacist to adjust dose for Vancomycin, Aminoglycosides and other anti-infective therapies as requested by physician.   Complete by: As directed    [facility] to provide Cath Flo 25m   Complete by: As directed    Administer for PICC line occlusion and as ordered by physician for other access device issues.   [facility] to provide Cath Flo 247m  Complete by: As directed    Administer for PICC line occlusion and as ordered by physician for other access device issues.   Anaphylaxis Kit: Provided to treat any anaphylactic reaction to the medication being provided to the patient if First Dose or when requested by physician   Complete by: As directed    Epinephrine 21m921ml vial / amp: Administer 0.89mg72m.89ml)90mbcutaneously once for moderate to severe anaphylaxis, nurse to call physician and pharmacy when reaction occurs and call 911 if needed for immediate care   Diphenhydramine 50mg/34mV vial: Administer 25-50mg I821m PRN for first dose reaction, rash, itching, mild reaction, nurse to call physician and pharmacy when reaction occurs   Sodium Chloride 0.9% NS 500ml IV67mminister if needed for hypovolemic blood pressure drop or as ordered by physician after call to physician with anaphylactic reaction   Anaphylaxis Kit: Provided to treat any anaphylactic reaction to the medication being provided to the patient if First Dose or when requested by physician   Complete by: As directed    Epinephrine 21mg/ml v16m / amp: Administer 0.89mg (0.89m58msubcu65meously once for moderate to severe anaphylaxis, nurse to call physician and pharmacy when reaction occurs and call 911 if needed for immediate care   Diphenhydramine 50mg/ml IV 75m: Administer 25-50mg IV/IM P19mor first dose reaction, rash, itching, mild reaction, nurse to call physician and pharmacy when reaction occurs   Sodium Chloride 0.9% NS 500ml IV: Admi20mer if needed for hypovolemic blood pressure drop or as ordered by physician after call to physician with anaphylactic reaction   Call MD for:  difficulty breathing, headache or visual disturbances   Complete by: As directed    Call MD for:  redness, tenderness, or signs of infection (pain, swelling, redness, odor or green/yellow discharge around incision site)   Complete by: As directed    Call MD for:   temperature >100.4   Complete by: As directed    Call MD for:  temperature >100.4   Complete by: As directed    Change dressing on IV access line weekly and PRN   Complete by: As directed    Change dressing on IV access line weekly and PRN   Complete by: As directed    Diet - low sodium heart healthy   Complete by: As directed    Diet Carb Modified   Complete by: As directed    Aspiration precautions, fine chopped food and thin liquid   Discharge instructions   Complete by: As directed    Please refer to radiology to remove indwelling catheter after completing antibiotics.   Flush IV access with Sodium Chloride 0.9% and Heparin 10 units/ml or 100 units/ml   Complete by: As directed    Flush IV access with Sodium Chloride 0.9% and Heparin 10 units/ml or 100 units/ml   Complete by: As directed    Home infusion instructions - [facility]   Complete by: As directed    Instructions: Flush IV access with Sodium Chloride 0.9% and Heparin 10units/ml or 100units/ml   Change dressing on IV access line: Weekly and PRN   Instructions Cath Flo 5m: Administer for PICC Line occlusion and as ordered by physician for other access device   [facility] pharmacist to adjust dose for: Vancomycin, Aminoglycosides and other anti-infective therapies as requested by physician   Home infusion instructions - [facility]   Complete by: As directed    Instructions: Flush IV access with Sodium Chloride 0.9% and Heparin 10units/ml or 100units/ml   Change dressing on IV access line: Weekly and PRN   Instructions Cath Flo 214m Administer for PICC Line occlusion and as ordered by physician for other access device   [facility] pharmacist to adjust dose for: Vancomycin, Aminoglycosides and other anti-infective therapies as requested by physician   Increase activity slowly   Complete by: As directed    Increase activity slowly   Complete by: As directed    Method of  administration may be changed at the discretion of home infusion pharmacist based upon assessment of the patient and/or caregiver's ability to self-administer the medication ordered   Complete by: As directed    Method of administration may be changed at the discretion of home infusion pharmacist based upon assessment of the patient and/or caregiver's ability to self-administer the medication ordered   Complete by: As directed    No wound care   Complete by: As directed    No wound care   Complete by: As directed    Outpatient Parenteral Antibiotic Therapy Information Antibiotic: Piperacillin-Tazobactam (Zosyn) IVPB; Indications for use: osteomyelitis; End Date: 09/12/2020   Complete by: As directed    Antibiotic: Piperacillin-Tazobactam (Zosyn) IVPB   Indications for use: osteomyelitis   End Date: 09/12/2020     Allergies as of 09/05/2020      Reactions   Ivp Dye [iodinated Diagnostic Agents]    Blood Pressure dropped----pt was pre-medicated with 13 hour prep and did fine with pre-meds--amy 03/09/11   Sulfa Antibiotics Hives   Sulfonamide Derivatives Hives, Itching   Lisinopril Cough   Metoprolol  Nausea And Vomiting   Statins Other (See Comments)   myalgias   Propoxyphene N-acetaminophen Other (See Comments)   Sharp pains- headache      Medication List    STOP taking these medications   ALLERGEN OT   ceFEPime  IVPB Commonly known as: MAXIPIME   celecoxib 200 MG capsule Commonly known as: CELEBREX   Entresto 49-51 MG Generic drug: sacubitril-valsartan   furosemide 20 MG tablet Commonly known as: LASIX   Januvia 100 MG tablet Generic drug: sitaGLIPtin   oxyCODONE-acetaminophen 10-325 MG tablet Commonly known as: PERCOCET     TAKE these medications   Accu-Chek Aviva Plus w/Device Kit Use as directed to check blood glucose daily. Dx: E11.51   Accu-Chek FastClix Lancets Misc Use as directed to check blood glucose. Dx: E11.51   acetaminophen 500 MG tablet Commonly  known as: TYLENOL Take 1,000 mg by mouth every 6 (six) hours as needed for mild pain, fever or headache.   albuterol 108 (90 Base) MCG/ACT inhaler Commonly known as: VENTOLIN HFA Inhale 2 puffs into the lungs every 6 (six) hours as needed for wheezing or shortness of breath.   ARIPiprazole 5 MG tablet Commonly known as: ABILIFY Take 1 tablet (5 mg total) by mouth daily. What changed:   medication strength  how much to take  how to take this  when to take this  additional instructions   aspirin EC 81 MG tablet Take 1 tablet (81 mg total) by mouth daily.   Brilinta 90 MG Tabs tablet Generic drug: ticagrelor Take 90 mg by mouth 2 (two) times daily.   buPROPion 300 MG 24 hr tablet Commonly known as: Wellbutrin XL Take 1 tablet (300 mg total) by mouth daily.   carvedilol 12.5 MG tablet Commonly known as: COREG Take 1 tablet (12.5 mg total) by mouth 2 (two) times daily with a meal. What changed:   medication strength  how much to take   clonazePAM 0.5 MG tablet Commonly known as: KLONOPIN Take 1 tablet (0.5 mg total) by mouth 2 (two) times daily as needed for up to 7 days for anxiety.   gabapentin 100 MG capsule Commonly known as: NEURONTIN Take 1 capsule (100 mg total) by mouth 2 (two) times daily. What changed:   medication strength  how much to take  how to take this  when to take this  additional instructions   glucose blood test strip Commonly known as: Accu-Chek Aviva Plus 1 each by Other route as needed for other. Use as instructed. Dx: E11.51   insulin aspart 100 UNIT/ML injection Commonly known as: novoLOG Inject 0-9 Units into the skin 3 (three) times daily with meals.   IRON PO Take 1 capsule by mouth daily.   isosorbide mononitrate 60 MG 24 hr tablet Commonly known as: IMDUR Take 60 mg by mouth daily.   multivitamin with minerals Tabs tablet Take 1 tablet by mouth daily.   Narcan 4 MG/0.1ML Liqd nasal spray kit Generic drug:  naloxone Place 4 mg into the nose as needed (opioid reversal).   nitroGLYCERIN 0.4 MG SL tablet Commonly known as: NITROSTAT PLACE 1 TABLET UNDER THE TONGUE EVERY 5 MINUTES AS NEEDED FOR CHEST PAIN What changed: See the new instructions.   pantoprazole 40 MG tablet Commonly known as: PROTONIX Take 1 tablet (40 mg total) by mouth 2 (two) times daily.   piperacillin-tazobactam  IVPB Commonly known as: ZOSYN Inject 3.375 g into the vein every 8 (eight) hours. Indication:  Septic arthritis First  Dose: No Last Day of Therapy:  09/12/20 Labs - Once weekly:  CBC/D and BMP, Labs - Every other week:  ESR and CRP Method of administration: Elastomeric (Continuous infusion) Method of administration may be changed at the discretion of home infusion pharmacist based upon assessment of the patient and/or caregiver's ability to self-administer the medication ordered.   piperacillin-tazobactam 3.375 GM/50ML IVPB Commonly known as: ZOSYN Inject 50 mLs (3.375 g total) into the vein every 8 (eight) hours for 8 days.   rOPINIRole 1 MG tablet Commonly known as: REQUIP Take 1 tablet (1 mg total) by mouth in the morning and at bedtime.   tamsulosin 0.4 MG Caps capsule Commonly known as: FLOMAX Take 1 capsule (0.4 mg total) by mouth 2 (two) times daily.   Vitamin D3 125 MCG (5000 UT) Tabs Take 5,000 Units by mouth daily.            Discharge Care Instructions  (From admission, onward)         Start     Ordered   09/04/20 0000  Change dressing on IV access line weekly and PRN  (Home infusion instructions - [facility] )        09/04/20 0827   09/04/20 0000  Change dressing on IV access line weekly and PRN  (Home infusion instructions - [facility] )        09/04/20 1056          Follow-up Information    [place] Follow up in 3 week(s).   Why: We will call with appt details Contact information: [address] [place] [address] [phone]               Allergies  Allergen Reactions  . Ivp Dye [Iodinated Diagnostic Agents]     Blood Pressure dropped----pt was pre-medicated with 13 hour prep and did fine with pre-meds--amy 03/09/11   . Sulfa Antibiotics Hives  . Sulfonamide Derivatives Hives and Itching  . Lisinopril Cough  . Metoprolol Nausea And Vomiting  . Statins Other (See Comments)    myalgias  . Propoxyphene N-Acetaminophen Other (See Comments)    Sharp pains- headache    Consultations:  Palliative medicine  Infectious disease  Orthopedics  Nephrology   Procedures/Studies: CT HEAD WO CONTRAST  Result Date: 08/24/2020 CLINICAL DATA:  Obtunded, rule out stroke EXAM: CT HEAD WITHOUT CONTRAST TECHNIQUE: Contiguous axial images were obtained from the base of the skull through the vertex without intravenous contrast. COMPARISON:  12/31/2019 FINDINGS: Brain: No evidence of acute infarction, hemorrhage, hydrocephalus, extra-axial collection or mass lesion/mass effect. Chronic small vessel ischemia in the cerebral white matter. Small remote left cerebellar infarction. Brain atrophy. Vascular: No hyperdense vessel or unexpected calcification. Skull: Negative for fracture or focal lesion. Sinuses/Orbits: Postoperative paranasal sinuses, including frontal sinus obliteration. IMPRESSION: Senescent changes without acute or reversible finding. Electronically Signed   By: Monte Fantasia M.D.   On: 08/24/2020 11:26   MR BRAIN WO CONTRAST  Result Date: 08/31/2020 CLINICAL DATA:  75 year old male with unexplained altered mental status. Acute kidney injury, dialysis. EXAM: MRI HEAD WITHOUT CONTRAST TECHNIQUE: Multiplanar, multiecho pulse sequences of the brain and surrounding structures were obtained without intravenous contrast. COMPARISON:  Head CT 08/24/2020.  Brain MRI 06/20/2019. FINDINGS: Brain: Stable cerebral volume from the prior MRI. No convincing restricted diffusion. No midline shift, mass effect, evidence of mass lesion,  ventriculomegaly, extra-axial collection or acute intracranial hemorrhage. Cervicomedullary junction and pituitary are within normal limits. Confluent bilateral periventricular white  matter T2 and FLAIR hyperintensity has progressed since 2020, along with patchy and widely scattered central and subcortical white matter signal changes, especially in the anterior frontal lobes. No cortical encephalomalacia or chronic cerebral blood products identified. Deep gray nuclei, brainstem and cerebellum are stable and within normal limits for age. Vascular: Major intracranial vascular flow voids are stable since 2020. Skull and upper cervical spine: Degenerative ligamentous hypertrophy about the odontoid, and C3-C4 disc protrusion visible today on series 9, image 13 resulting in degenerative spinal stenosis with mild spinal cord mass effect. Visualized bone marrow signal is within normal limits. Sinuses/Orbits: Prior sinus surgery including chronic postoperative changes to the bilateral frontal bones with fat packing and residual mucosal disease, better detailed on CT. Stable sinus aeration since 2020. Globes appear stable. Other: Mastoids remain well aerated. Grossly normal visible internal auditory structures. Negative visible scalp and face. IMPRESSION: 1. No acute intracranial abnormality. 2. Advanced bilateral cerebral white matter disease with progression since 2020, nonspecific but most commonly due to chronic small vessel ischemia. 3. 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 4. Chronic sinus disease and postoperative changes. Electronically Signed   By: Genevie Ann M.D.   On: 08/31/2020 04:17   US RENAL  Result Date: 08/23/2020 CLINICAL DATA:  Acute renal failure EXAM: RENAL / URINARY TRACT ULTRASOUND COMPLETE COMPARISON:  CT 09/21/2010 FINDINGS: Right Kidney: Renal measurements: 13.3 x 6.2 x 5.3 cm = volume: 223 mL. Increased echogenicity. No mass or hydronephrosis  visualized. Septated cyst measuring 3 cm in size. Left Kidney: Renal measurements: 14.0 x 8.6 x 7.2 cm = volume: 454 mL. Increased echogenicity. No mass or hydronephrosis visualized. Nonobstructing 4 mm stone in the midportion. Bladder: Appears normal for degree of bladder distention. Other: None. IMPRESSION: Enlarged echogenic kidneys consistent with acute nephritis. No hydronephrosis. Electronically Signed   By: Nelson Chimes M.D.   On: 08/23/2020 12:19   IR Fluoro Guide CV Line Left  Result Date: 08/25/2020 INDICATION: 46 year old with acute kidney injury and needs hemodialysis catheter. EXAM: FLUOROSCOPIC AND ULTRASOUND GUIDED PLACEMENT OF A NON-TUNNELED DIALYSIS CATHETER Physician: Stephan Minister. Henn, MD MEDICATIONS: None ANESTHESIA/SEDATION: None FLUOROSCOPY TIME:  Fluoroscopy Time: 30 seconds, 2 mGy COMPLICATIONS: None immediate. PROCEDURE: Informed consent could not be obtained because of the patient's altered mental status. Emergency consent was obtained. The patient was placed supine on the interventional table. Ultrasound confirmed a patent left internal vein. Ultrasound images were obtained for documentation. The left neck was prepped and draped in a sterile fashion. The left neck was anesthetized with 1% lidocaine. Maximal barrier sterile technique was utilized including caps, mask, sterile gowns, sterile gloves, sterile drape, hand hygiene and skin antiseptic. A small incision was made with #11 blade scalpel. A 21 gauge needle directed into the left internal jugular vein with ultrasound guidance. A micropuncture dilator set was placed. A 20 cm Mahurkar catheter was selected. The catheter was advanced over a wire and positioned in the superior vena cava. Fluoroscopic images were obtained for documentation. Both dialysis lumens were found to aspirate and flush well. The proper amount of heparin was flushed in both lumens. The central venous lumen was flushed with normal saline. Catheter was sutured to skin.  FINDINGS: Dialysis catheter tip in the superior vena cava. Existing tunneled right jugular central venous catheter with tip at the superior cavoatrial junction. IMPRESSION: Successful placement of a left jugular non-tunneled dialysis catheter using ultrasound and fluoroscopic guidance. Electronically Signed   By: Quita Skye  Anselm Pancoast M.D.   On: 08/25/2020 17:56   IR US Guide Vasc Access Left  Result Date: 08/25/2020 INDICATION: 60 year old with acute kidney injury and needs hemodialysis catheter. EXAM: FLUOROSCOPIC AND ULTRASOUND GUIDED PLACEMENT OF A NON-TUNNELED DIALYSIS CATHETER Physician: Stephan Minister. Henn, MD MEDICATIONS: None ANESTHESIA/SEDATION: None FLUOROSCOPY TIME:  Fluoroscopy Time: 30 seconds, 2 mGy COMPLICATIONS: None immediate. PROCEDURE: Informed consent could not be obtained because of the patient's altered mental status. Emergency consent was obtained. The patient was placed supine on the interventional table. Ultrasound confirmed a patent left internal vein. Ultrasound images were obtained for documentation. The left neck was prepped and draped in a sterile fashion. The left neck was anesthetized with 1% lidocaine. Maximal barrier sterile technique was utilized including caps, mask, sterile gowns, sterile gloves, sterile drape, hand hygiene and skin antiseptic. A small incision was made with #11 blade scalpel. A 21 gauge needle directed into the left internal jugular vein with ultrasound guidance. A micropuncture dilator set was placed. A 20 cm Mahurkar catheter was selected. The catheter was advanced over a wire and positioned in the superior vena cava. Fluoroscopic images were obtained for documentation. Both dialysis lumens were found to aspirate and flush well. The proper amount of heparin was flushed in both lumens. The central venous lumen was flushed with normal saline. Catheter was sutured to skin. FINDINGS: Dialysis catheter tip in the superior vena cava. Existing tunneled right jugular central  venous catheter with tip at the superior cavoatrial junction. IMPRESSION: Successful placement of a left jugular non-tunneled dialysis catheter using ultrasound and fluoroscopic guidance. Electronically Signed   By: Markus Daft M.D.   On: 08/25/2020 17:56   DG CHEST PORT 1 VIEW  Result Date: 09/05/2020 CLINICAL DATA:  Acute respiratory failure EXAM: PORTABLE CHEST 1 VIEW COMPARISON:  None. FINDINGS: The heart size and mediastinal contours are within normal limits. Overlying median sternotomy wires are present. There is prominence of the central pulmonary vasculature with diffusely increased interstitial markings seen throughout both lungs. A right-sided catheter seen with the tip at the superior cavoatrial junction. Bilateral shoulder osteoarthritis is noted. IMPRESSION: Diffusely increased interstitial markings which could be due to interstitial edema. Electronically Signed   By: Prudencio Pair M.D.   On: 09/05/2020 01:58   DG CHEST PORT 1 VIEW  Result Date: 08/26/2020 CLINICAL DATA:  Hypoxia. EXAM: PORTABLE CHEST 1 VIEW COMPARISON:  Single-view of the chest 08/23/2020. FINDINGS: New tunnel dialysis catheter with its tip in the superior vena cava noted. Right IJ catheter is unchanged. Lung volumes are low with mild basilar atelectasis. Heart size is mildly enlarged. The patient is status post CABG. No pneumothorax or pleural effusion. IMPRESSION: No acute disease. Electronically Signed   By: Inge Rise M.D.   On: 08/26/2020 10:06   DG CHEST PORT 1 VIEW  Result Date: 08/23/2020 CLINICAL DATA:  75 year old male with shortness of breath EXAM: PORTABLE CHEST 1 VIEW COMPARISON:  Chest radiograph dated 07/13/2020. FINDINGS: Central venous line with tip at the cavoatrial junction. Cardiomegaly with mild vascular congestion. No focal consolidation, pleural effusion or pneumothorax. Median sternotomy wires. No acute osseous pathology. IMPRESSION: Cardiomegaly with mild vascular congestion. No focal  consolidation. Electronically Signed   By: Anner Crete M.D.   On: 08/23/2020 21:57   DG Abd Portable 1V  Result Date: 08/23/2020 CLINICAL DATA:  MRI clearance EXAM: PORTABLE ABDOMEN - 1 VIEW COMPARISON:  None. FINDINGS: No metallic foreign body was identified in the abdomen or pelvis. Advanced vascular calcifications are noted. The bowel  gas pattern is nonobstructive. There is a moderate amount of stool in the colon. Degenerative changes are noted throughout the lumbar spine and bilateral hips. IMPRESSION: 1. No metallic foreign body identified in the abdomen or pelvis. Advanced vascular calcifications are noted. 2. Nonobstructive bowel gas pattern. Moderate stool burden. Electronically Signed   By: Constance Holster M.D.   On: 08/23/2020 22:04   DG Swallowing Func-Speech Pathology  Result Date: 08/28/2020 Objective Swallowing Evaluation: Type of Study: MBS-Modified Barium Swallow Study  Patient Details Name: Parley Pidcock MRN: [id number] Date of Birth: [birth year] Today's Date: 08/28/2020 Time: SLP Start Time (ACUTE ONLY): 1300 -SLP Stop Time (ACUTE ONLY): 1319 SLP Time Calculation (min) (ACUTE ONLY): 19 min Past Medical History: Past Medical History: Diagnosis Date . Acute renal failure (ARF) ([place]) 08/21/2020 . Anemia   NOS . Anxiety  . Blindness of left eye   near blindness. 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 BPH (benign prostatic hypertrophy)  . Broken foot Oct. 12, 2013  Right foot Fx . CAD (coronary artery disease)  . Carotid stenosis, left  . Cellulitis and abscess of leg 03/2018  right leg . Chronic hyponatremia  . Chronic pain syndrome  . COPD (chronic obstructive pulmonary disease) ([place])  . Depression  . Diabetes mellitus  . Diabetic foot ulcer ([place])  . DJD (degenerative joint disease)   L wrist . DM2 (diabetes mellitus, type 2) ([place])  . GERD (gastroesophageal reflux disease)  . HLD (hyperlipidemia)  . HTN (hypertension)  . Hx of blood transfusion reaction  . Hyponatremia  . Lumbar disc disease  . MI (mitral  incompetence)  . Myocardial infarction ([place])   x 2 . Osteoporosis   tx per [facility], Dr Prudencio Burly, thought due to heavy steriod use after 1978 . Peripheral neuropathy  . Peripheral vascular disease ([place])  . Restless leg syndrome  . Spine fracture   hx, multiple . Varicose veins  . 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 Vitamin D deficiency  Past Surgical History: Past Surgical History: Procedure Laterality Date . ANGIOPLASTY   . aorta bifemoral bypass grafting  09/2010 . CARDIAC CATHETERIZATION N/A 09/19/2015  Procedure: Left Heart Cath and Coronary Angiography;  Surgeon: Adrian Prows, MD;  Location: [facility];  Service: Cardiovascular;  Laterality: N/A; . CARPAL TUNNEL RELEASE    right 2006/ left 2007 . CATARACT EXTRACTION    left 1996/ right 1997 . CORONARY ARTERY BYPASS GRAFT N/A 09/22/2015  Procedure: CORONARY ARTERY BYPASS GRAFTING (CABG) times four using the right greater saphenous vein harvested endoscopically and the left internal mammary artery.  LIMA-LAD, SEQ SVG-DIAG & OM, SVG-PD.;  Surgeon: [name], MD;  Location: [facility];  Service: Open Heart Surgery;  Laterality: N/A; . I & D EXTREMITY Right 04/07/2018  Procedure: IRRIGATION AND DEBRIDEMENT ABSCESS RIGHT LEG;  Surgeon: Newt Minion, MD;  Location: [facility];  Service: Orthopedics;  Laterality: Right; . INCISION AND DRAINAGE OF WOUND Right 05/07/2020  Procedure: RIGHT LONG FINGER IRRIGATION AND DEBRIDEMENT AND AMPUTATION;  Surgeon: Iran Planas, MD;  Location: [facility];  Service: Orthopedics;  Laterality: Right;  with IV sedation . INCISION AND DRAINAGE OF WOUND Right 05/12/2020  Procedure: IRRIGATION AND DEBRIDEMENT WOUND and possible revision amputation;  Surgeon: Iran Planas, MD;  Location: [facility];  Service: Orthopedics;  Laterality: Right;  needs 60 minutes . IR FLUORO GUIDE CV LINE LEFT  08/25/2020 . IR FLUORO GUIDE CV LINE RIGHT  07/18/2020 . IR US GUIDE VASC ACCESS LEFT  08/25/2020 .  IR US GUIDE VASC ACCESS RIGHT  07/18/2020 . L foot open  repair jones fracture  2010   5th metetarsal  . left ankle ganglion cyst  1976 . left carotid endarterectomy  03/2011 . left cataract  1996  right - 1997 . left CTS  2006  R CTS - 2007 . left foot surgery  1998  R surgery/fracture - 1999 . left plantar ganglion cystectomy  1979 . left wrist/hand fusion  2008 . [facility], 2004 . NASAL SINUS SURGERY    multiple- x8. last 1997 with obliteration . REPAIR THORACIC AORTA  2017 . right hand fracture  1969 . ROTATOR CUFF REPAIR  2006  R, than repeat 2011, Dr Theda Sers . SHOULDER ARTHROSCOPY Bilateral 07/17/2020  Procedure: Right shoulder arthroscopic lavage; Left shoulder aspiration.;  Surgeon: Justice Britain, MD;  Location: [facility];  Service: Orthopedics;  Laterality: Bilateral;  144mn . SHOULDER ARTHROSCOPY WITH LABRAL REPAIR Left 07/10/2020  Procedure: SHOULDER ARTHROSCOPY WITH WASHOUT;  Surgeon: SJustice Britain MD;  Location[facility];  Service: Orthopedics;  Laterality: Left; . TRIGGER FINGER RELEASE Left 11/11/2014  Procedure: LEFT LONG FINGER RELEASE TRIGGER FINGER/A-1 PULLEY;  Surgeon: DMilly Jakob MD;  [facility]  Service: Orthopedics;  Laterality: Left; . vocal surgery  1996 HPI: Pt is a 75y.o. male presenting with septic arthritis of shoulder and AMS. He has a history of chronic systold heart failure, DM with completication of neuropathy, PAD, ASVD diffuse, and chronic shoulder problems. Pt also has a history of GERD, blindness of left eye, COPD, and a left carotid endarterectomy. He also had vocal fold surgery in 1996. 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 Pt did not follow up for further treatment.  Subjective: Pt was agreeable and alert Assessment / Plan / Recommendation CHL IP CLINICAL IMPRESSIONS 08/28/2020 Clinical Impression Pt presents with oropharyngeal dysphagia characterized by reduced bolus cohesion, a pharyngeal delay, reduced lingual retraction,  and reduced anterior laryngeal movement. He demonstrated premature spillage to the valleculae and pyriform sinuses, mild-moderate vallecular residue, and pyriform sinus residue. Amount of residue increased with bolus size and penetration (PAS 3) of pyriform sinus residue was intermittently noted on secondary swallows of full-tsp boluses of solids. Pt inconsistently reported that he swallowed solid boluses once they had passed the posterior faucial arches and required additional boluses to be provided for a swallow to be triggered. The swallow was often triggered with >50% of the liquid bolus at the level of the pyriform sinuses and this resulted in aspiration (PAS 7) of thin and nectar thick liquids with delayed laryngeal sensation. Pt's cough was weak and propelled the aspirate above the vocal folds, but did not expel it from the larynx and it was subsequently aspirated. Postural modifications (i.e., chin tuck and L/R head turn) and use of effortful swallows were ineffective in eliminating laryngeal invasion, but no instances of penetration/aspiration were noted with honey thick liquids. A dysphagia 2 diet with honey thick liquids is recommended at this time with strict observance of swallowing precautions and full supervision to ensure observance. SLP will follow for dysphagia treatment. SLP Visit Diagnosis Dysphagia, oropharyngeal phase (R13.12) Attention and concentration deficit following -- Frontal lobe and executive function deficit following -- Impact on safety and function Moderate aspiration risk   CHL IP TREATMENT RECOMMENDATION 08/28/2020 Treatment Recommendations Therapy as outlined in treatment plan below   Prognosis 08/28/2020 Prognosis for Safe Diet Advancement Fair Barriers  to Reach Goals Cognitive deficits;Time post onset Barriers/Prognosis Comment -- CHL IP DIET RECOMMENDATION 08/28/2020 SLP Diet Recommendations Dysphagia 2 (Fine chop) solids;Honey thick liquids Liquid Administration via Cup;Straw  Medication Administration Crushed with puree Compensations Slow rate;Small sips/bites;Follow solids with liquid;Minimize environmental distractions;Multiple dry swallows after each bite/sip Postural Changes Seated upright at 90 degrees   CHL IP OTHER RECOMMENDATIONS 08/28/2020 Recommended Consults -- Oral Care Recommendations Oral care BID Other Recommendations --   CHL IP FOLLOW UP RECOMMENDATIONS 08/28/2020 Follow up Recommendations (No Data)   CHL IP FREQUENCY AND DURATION 08/28/2020 Speech Therapy Frequency (ACUTE ONLY) min 2x/week Treatment Duration 2 weeks      CHL IP ORAL PHASE 08/28/2020 Oral Phase Impaired Oral - Pudding Teaspoon -- Oral - Pudding Cup -- Oral - Honey Teaspoon -- Oral - Honey Cup Decreased bolus cohesion;Premature spillage Oral - Nectar Teaspoon -- Oral - Nectar Cup Decreased bolus cohesion;Premature spillage Oral - Nectar Straw Decreased bolus cohesion;Premature spillage Oral - Thin Teaspoon Decreased bolus cohesion;Premature spillage Oral - Thin Cup Decreased bolus cohesion;Premature spillage Oral - Thin Straw Decreased bolus cohesion;Premature spillage Oral - Puree -- Oral - Mech Soft -- Oral - Regular -- Oral - Multi-Consistency -- Oral - Pill Reduced posterior propulsion Oral Phase - Comment --  CHL IP PHARYNGEAL PHASE 08/28/2020 Pharyngeal Phase Impaired Pharyngeal- Pudding Teaspoon -- Pharyngeal -- Pharyngeal- Pudding Cup -- Pharyngeal -- Pharyngeal- Honey Teaspoon -- Pharyngeal -- Pharyngeal- Honey Cup Reduced tongue base retraction;Reduced anterior laryngeal mobility;Delayed swallow initiation-vallecula;Delayed swallow initiation-pyriform sinuses;Pharyngeal residue - valleculae;Pharyngeal residue - pyriform Pharyngeal -- Pharyngeal- Nectar Teaspoon -- Pharyngeal -- Pharyngeal- Nectar Cup Reduced tongue base retraction;Reduced anterior laryngeal mobility;Delayed swallow initiation-vallecula;Delayed swallow initiation-pyriform sinuses;Pharyngeal residue - valleculae;Pharyngeal residue -  pyriform;Penetration/Aspiration during swallow;Penetration/Apiration after swallow;Moderate aspiration Pharyngeal Material enters airway, passes BELOW cords and not ejected out despite cough attempt by patient Pharyngeal- Nectar Straw Reduced tongue base retraction;Reduced anterior laryngeal mobility;Delayed swallow initiation-vallecula;Delayed swallow initiation-pyriform sinuses;Pharyngeal residue - valleculae;Pharyngeal residue - pyriform;Penetration/Aspiration during swallow;Penetration/Apiration after swallow;Moderate aspiration Pharyngeal Material enters airway, passes BELOW cords and not ejected out despite cough attempt by patient Pharyngeal- Thin Teaspoon Reduced tongue base retraction;Reduced anterior laryngeal mobility;Delayed swallow initiation-vallecula;Delayed swallow initiation-pyriform sinuses;Pharyngeal residue - valleculae;Pharyngeal residue - pyriform;Penetration/Aspiration during swallow;Penetration/Apiration after swallow;Moderate aspiration Pharyngeal Material enters airway, passes BELOW cords and not ejected out despite cough attempt by patient;Material enters airway, remains ABOVE vocal cords and not ejected out Pharyngeal- Thin Cup Reduced tongue base retraction;Reduced anterior laryngeal mobility;Delayed swallow initiation-vallecula;Delayed swallow initiation-pyriform sinuses;Pharyngeal residue - valleculae;Pharyngeal residue - pyriform;Penetration/Aspiration during swallow;Penetration/Apiration after swallow;Moderate aspiration Pharyngeal Material enters airway, passes BELOW cords and not ejected out despite cough attempt by patient Pharyngeal- Thin Straw Reduced tongue base retraction;Reduced anterior laryngeal mobility;Delayed swallow initiation-vallecula;Delayed swallow initiation-pyriform sinuses;Pharyngeal residue - valleculae;Pharyngeal residue - pyriform;Penetration/Aspiration during swallow;Penetration/Apiration after swallow;Moderate aspiration Pharyngeal Material enters airway,  passes BELOW cords and not ejected out despite cough attempt by patient Pharyngeal- Puree Reduced tongue base retraction;Reduced anterior laryngeal mobility;Delayed swallow initiation-vallecula;Delayed swallow initiation-pyriform sinuses;Pharyngeal residue - valleculae;Pharyngeal residue - pyriform;Penetration/Aspiration during swallow;Penetration/Apiration after swallow;Moderate aspiration Pharyngeal Material enters airway, remains ABOVE vocal cords and not ejected out Pharyngeal- Mechanical Soft -- Pharyngeal -- Pharyngeal- Regular Reduced tongue base retraction;Reduced anterior laryngeal mobility;Delayed swallow initiation-vallecula;Delayed swallow initiation-pyriform sinuses;Pharyngeal residue - valleculae;Pharyngeal residue - pyriform;Penetration/Aspiration during swallow;Penetration/Apiration after swallow;Moderate aspiration Pharyngeal Material enters airway, remains ABOVE vocal cords and not ejected out Pharyngeal- Multi-consistency -- Pharyngeal -- Pharyngeal- Pill Reduced tongue base retraction;Reduced anterior laryngeal mobility;Delayed swallow initiation-vallecula;Delayed swallow initiation-pyriform sinuses;Pharyngeal residue - valleculae;Pharyngeal residue - pyriform;Penetration/Aspiration during  swallow;Penetration/Apiration after swallow;Moderate aspiration Pharyngeal -- Pharyngeal Comment --  CHL IP CERVICAL ESOPHAGEAL PHASE 08/28/2020 Cervical Esophageal Phase WFL Pudding Teaspoon -- Pudding Cup -- Honey Teaspoon -- Honey Cup -- Nectar Teaspoon -- Nectar Cup -- Nectar Straw -- Thin Teaspoon -- Thin Cup -- Thin Straw -- Puree -- Mechanical Soft -- Regular -- Multi-consistency -- Pill -- Cervical Esophageal Comment -- Shanika I. Hardin Negus, [place], [place] Office number [phone] Pager [place] 08/28/2020, 2:08 PM               (Echo, Carotid, EGD, Colonoscopy, ERCP)    Subjective: Patient seen and examined.  No overnight events.  He himself denies  any complaints.  Prefers to use nasal cannula, however 2 L oxygen is going on his cheek near the ear. Denies any chest pain.  Appetite is fair.  Aware about going to skilled nursing facility and waiting for bed.   Discharge Exam: Vitals:   09/05/20 0505 09/05/20 0741  BP:  (!) 139/59  Pulse: (!) 108 (!) 104  Resp: (!) 27 20  Temp:  98.5 F (36.9 C)  SpO2: 96% 99%   Vitals:   09/05/20 0343 09/05/20 0500 09/05/20 0505 09/05/20 0741  BP: 135/76   (!) 139/59  Pulse: (!) 109 (!) 110 (!) 108 (!) 104  Resp: 20 (!) 23 (!) 27 20  Temp: 99.4 F (37.4 C)   98.5 F (36.9 C)  TempSrc: Axillary   Axillary  SpO2: 98% 97% 96% 99%  Weight:      Height:        General: Pt is alert, awake, not in acute distress Chronically sick looking.  Frail and debilitated gentleman who is alert and oriented x4. Cardiovascular: RRR, S1/S2 +, no rubs, no gallops Respiratory: CTA bilaterally, no wheezing, no rhonchi Right subclavian tunneled catheter present.  Nontender. Abdominal: Soft, NT, ND, bowel sounds + Extremities: no edema, no cyanosis He has venous engorgement of the legs, there is no edema.    The results of significant diagnostics from this hospitalization (including imaging, microbiology, ancillary and laboratory) are listed below for reference.     Microbiology: Recent Results (from the past 240 hour(s))  SARS CORONAVIRUS 2 (TAT 6-24 HRS) Nasopharyngeal Nasopharyngeal Swab     Status: None   Collection Time: 09/03/20  2:46 PM   Specimen: Nasopharyngeal Swab  Result Value Ref Range Status   SARS Coronavirus 2 NEGATIVE NEGATIVE Final    Comment: (NOTE) SARS-CoV-2 target nucleic acids are NOT DETECTED.  The SARS-CoV-2 RNA is generally detectable in upper and lower respiratory specimens during the acute phase of infection. Negative results do not preclude SARS-CoV-2 infection, do not rule out co-infections with other pathogens, and should not be used as the sole basis for treatment or  other patient management decisions. Negative results must be combined with clinical observations, patient history, and epidemiological information. The expected result is Negative.  Fact Sheet for Patients: SugarRoll.be  Fact Sheet for Healthcare Providers: https://www.woods-mathews.com/  This test is not yet approved or cleared by the [place] FDA and  has been authorized for detection and/or diagnosis of SARS-CoV-2 by FDA under an Emergency Use Authorization (EUA). This EUA will remain  in effect (meaning this test can be used) for the duration of the COVID-19 declaration under Se ction 564(b)(1) of the Act, 21 U.S.C. section 360bbb-3(b)(1), unless the authorization is terminated or revoked sooner.  Performed at [facility], [place] [address] [place], [place] [address]  Labs: BNP (last 3 results) Recent Labs    05/02/20 1620 07/11/20 0425 08/22/20 2201  BNP 626.2* 2,329.1* 6,962.9*   Basic Metabolic Panel: Recent Labs  Lab 09/01/20 0500 09/02/20 0854 09/03/20 0910 09/04/20 0024 09/05/20 0609  NA 146* 145 144 143 142  K 3.6 3.5 3.3* 3.6 3.7  CL 110 109 110 110 106  CO2 23 21* 22 22 21*  GLUCOSE 148* 191* 236* 173* 154*  BUN 44* 40* 39* 38* 35*  CREATININE 3.39* 3.36* 3.37* 3.37* 3.27*  CALCIUM 8.4* 8.7* 8.5* 8.6* 8.7*  MG 2.0  --   --   --   --   PHOS  --  4.2  --   --   --    Liver Function Tests: Recent Labs  Lab 09/02/20 0854  ALBUMIN 2.1*   No results for input(s): LIPASE, AMYLASE in the last 168 hours. No results for input(s): AMMONIA in the last 168 hours. CBC: Recent Labs  Lab 09/01/20 0500 09/02/20 0500 09/03/20 0415 09/04/20 0024 09/05/20 0609  WBC 7.1 6.8 6.5 5.6 6.8  NEUTROABS 4.6 4.1 4.0 3.2 4.9  HGB 8.9* 8.2* 8.0* 7.8* 8.8*  HCT 27.9* 27.3* 25.3* 24.3* 28.0*  MCV 92.1 94.5 93.4 93.1 93.0  PLT 219 210 218 218 279   Cardiac Enzymes: No results for input(s): CKTOTAL, CKMB,  CKMBINDEX, TROPONINI in the last 168 hours. BNP: Invalid input(s): POCBNP CBG: Recent Labs  Lab 09/04/20 0630 09/04/20 1144 09/04/20 1554 09/04/20 2120 09/05/20 0614  GLUCAP 141* 184* 163* 149* 148*   D-Dimer No results for input(s): DDIMER in the last 72 hours. Hgb A1c No results for input(s): HGBA1C in the last 72 hours. Lipid Profile No results for input(s): CHOL, HDL, LDLCALC, TRIG, CHOLHDL, LDLDIRECT in the last 72 hours. Thyroid function studies No results for input(s): TSH, T4TOTAL, T3FREE, THYROIDAB in the last 72 hours.  Invalid input(s): FREET3 Anemia work up No results for input(s): VITAMINB12, FOLATE, FERRITIN, TIBC, IRON, RETICCTPCT in the last 72 hours. Urinalysis    Component Value Date/Time   COLORURINE YELLOW 08/24/2020 0748   APPEARANCEUR CLEAR 08/24/2020 0748   LABSPEC 1.012 08/24/2020 0748   PHURINE 6.0 08/24/2020 0748   GLUCOSEU NEGATIVE 08/24/2020 0748   GLUCOSEU NEGATIVE 11/15/2014 1003   HGBUR SMALL (A) 08/24/2020 0748   BILIRUBINUR NEGATIVE 08/24/2020 0748   KETONESUR NEGATIVE 08/24/2020 0748   PROTEINUR 100 (A) 08/24/2020 0748   UROBILINOGEN 0.2 11/15/2014 1003   NITRITE NEGATIVE 08/24/2020 0748   LEUKOCYTESUR SMALL (A) 08/24/2020 0748   Sepsis Labs Invalid input(s): PROCALCITONIN,  WBC,  LACTICIDVEN Microbiology Recent Results (from the past 240 hour(s))  SARS CORONAVIRUS 2 (TAT 6-24 HRS) Nasopharyngeal Nasopharyngeal Swab     Status: None   Collection Time: 09/03/20  2:46 PM   Specimen: Nasopharyngeal Swab  Result Value Ref Range Status   SARS Coronavirus 2 NEGATIVE NEGATIVE Final    Comment: (NOTE) SARS-CoV-2 target nucleic acids are NOT DETECTED.  The SARS-CoV-2 RNA is generally detectable in upper and lower respiratory specimens during the acute phase of infection. Negative results do not preclude SARS-CoV-2 infection, do not rule out co-infections with other pathogens, and should not be used as the sole basis for treatment or  other patient management decisions. Negative results must be combined with clinical observations, patient history, and epidemiological information. The expected result is Negative.  Fact Sheet for Patients: SugarRoll.be  Fact Sheet for Healthcare Providers: https://www.woods-mathews.com/  This test is not yet approved or cleared by the [place]  States FDA and  has been authorized for detection and/or diagnosis of SARS-CoV-2 by FDA under an Emergency Use Authorization (EUA). This EUA will remain  in effect (meaning this test can be used) for the duration of the COVID-19 declaration under Se ction 564(b)(1) of the Act, 21 U.S.C. section 360bbb-3(b)(1), unless the authorization is terminated or revoked sooner.  Performed at [facility], [place] [address], [place], [place] [address]      Time coordinating discharge:  38 minutes  SIGNED:   Barb Merino, MD  [facility] 09/05/2020, 10:16 AM

## 2020-09-06 DIAGNOSIS — Z7189 Other specified counseling: Secondary | ICD-10-CM | POA: Diagnosis not present

## 2020-09-06 DIAGNOSIS — N179 Acute kidney failure, unspecified: Secondary | ICD-10-CM | POA: Diagnosis not present

## 2020-09-06 DIAGNOSIS — Z515 Encounter for palliative care: Secondary | ICD-10-CM | POA: Diagnosis not present

## 2020-09-06 DIAGNOSIS — M00819 Arthritis due to other bacteria, unspecified shoulder: Secondary | ICD-10-CM | POA: Diagnosis not present

## 2020-09-06 LAB — GLUCOSE, CAPILLARY
Glucose-Capillary: 157 mg/dL — ABNORMAL HIGH (ref 70–99)
Glucose-Capillary: 201 mg/dL — ABNORMAL HIGH (ref 70–99)

## 2020-09-06 MED ORDER — CEFEPIME HCL 2 G IJ SOLR
2.0000 g | INTRAMUSCULAR | Status: AC
  Administered 2020-09-06: 2 g via INTRAVENOUS
  Filled 2020-09-06: qty 2

## 2020-09-06 MED ORDER — PIPERACILLIN-TAZOBACTAM IV (FOR PTA / DISCHARGE USE ONLY): 13.5000 g | INTRAVENOUS | 0 refills | Status: DC

## 2020-09-06 MED ORDER — CEFEPIME IV (FOR PTA / DISCHARGE USE ONLY): 2.0000 g | INTRAVENOUS | 0 refills | Status: AC

## 2020-09-06 NOTE — Progress Notes (Addendum)
PHARMACY CONSULT NOTE FOR:  OUTPATIENT  PARENTERAL ANTIBIOTIC THERAPY (OPAT)  Indication: septic arthritis Regimen: Cefepime 2g IV every 24 hours  End date: 09/12/20  Discussed with Dr [name]- given language barriers and frequency of zosyn, continuous infusion zosyn would have been more ideal for home health. However, with Scr 3.2 on last check and close to 20 mL/min adjustment cut-off, nervous about continuous infusion zosyn. Okay to change to cefepime for remainder of therapy at renal appropriate dose.   IV antibiotic discharge orders are pended. To discharging provider:  please sign these orders via discharge navigator,  Select New Orders & click on the button choice - Manage This Unsigned Work.     Thank you for allowing pharmacy to be a part of this patient's care.  Antonietta Jewel, PharmD, [place] Clinical Pharmacist  Phone: [phone] 09/06/2020 11:37 AM  Please check AMION for all [place] phone numbers After 10:00 PM, call [place] [phone]

## 2020-09-06 NOTE — Discharge Summary (Signed)
Physician Discharge Summary  [name] LFY:[id number] DOB: 1945/11/11 DOA: 08/22/2020  PCP: Doreatha Lew, MD  Admit date: 08/22/2020 Discharge date: 09/06/2020  Admitted From: Home Disposition: Home with home health  Recommendations for Outpatient Follow-up:  1. Follow up with PCP in 1-2 weeks after discharge 2. Please obtain BMP/CBC in one week 3. If abnormal renal functions, schedule follow-up with nephrology.   Home Health: PT/OT/RN Equipment/Devices: Infusion therapy at home.  Discharge Condition: Stable CODE STATUS: Full code Diet recommendation: Low-salt low-carb diet.  Dysphagia 2 diet with aspiration precautions.  Discharge summary:  75 year old gentleman with history of extensive medical problems, chronic systolic heart failure, type 2 diabetes with neuropathy, peripheral arterial disease and chronic shoulder problems. Admitted 11/24-12/17 for septic arthritis of both shoulders with GBS and Pseudomonas with septic shock, osteomyelitis of the right humerus. tunneled  PICC line placed and discharged home on 8 weeks of cefepime until 1/28.  1/6, seen at ID office and readmitted with renal failure and orthopedic consultation. Developed acute uremic encephalopathy, cefepime changed to Zosyn.  Underwent hemodialysis with temporary catheter on 1/11, repeat on 1/14. Orthopedics recommended conservative management.  ID recommended Zosyn until 1/28. 1/15, overnight some confusion and clearing up in the morning.MRI normal.  1/18, mental status improved.  Remains debilitated and frail.   palliative care and case management following for healthcare power of attorney, findings families.     Assessment & Plan of care:   # Septic arthritis of bilateral shoulder/osteomyelitis of the humerus:  Multiple procedures and aspirations.  Followed by orthopedics and they recommended antibiotic treatment.  ID on board. Antibiotics with Zosyn until 1/28.  Clinically improving. Urine  cultures 1/9 with VRE, 50,000 colonies.  Not treating.  Patient does have tunnelled catheter right subclavian vein.  Please refer to interventional radiology after completing antibiotic therapy for removal of catheter.  # Acute renal failure: Hypernatremia resolved.  Normal renal functions on previous admission.  Came into the hospital with creatinine 4.13 which peaked to 5.3.  Multifactorial secondary to sepsis, AIN.  Developed confusion and encephalopathy and underwent hemodialysis 1/11, 1/14.  Followed by nephrology for dialysis need.  Urine output is adequate. Foley catheter removed 1/19, urinating well.  On Flomax. HD catheter removed 1/19. Renal functions are stable. Recheck in 1 week to ensure stabilization.  # Acute metabolic infective encephalopathy: Suspected secondary to uremia.  Initially suspected secondary to cefepime.  Likely consistent with uremia. Mental status mostly improved. CT head 1/9 with no acute findings. Patient does not have any focal deficits. MRI brain with no deficits.  # Hypertension: Blood pressures are stable now. Heart rate control remains issue.  Increase dose of carvedilol.    # Type 2 diabetes: On sliding scale insulin.  He is on Januvia at home.  We will keep on insulin until his renal functions normalized.  # Chronic systolic heart failure: Last echocardiogram on 07/10/2020 with ejection fraction 50 to 55%.  Euvolemic.  Lasix and Entresto on hold until renal functions normalized.  Carvedilol resumed with increasing dose.  # History of coronary artery disease status post CABG: Patient on dual antiplatelet therapy with aspirin and Brilinta.  He is on carvedilol.  No evidence of acute coronary syndrome.  # Acute hypoxemic respiratory failure: Due to fluid overload.  chest physiotherapy and incentive spirometry. Today on room air. Stabilized.  Intermittently uses oxygen for comfort but not hypoxic.  # Generalized anxiety disorder: On multiple  medications that were hold.  Using Wellbutrin at home that  is resumed. Decreased dose of gabapentin due to renal failure. Decreased dose of Klonopin to have that he was taking at home.  Symptoms are well controlled.    Patient is medically stable today.  He continues to have medical issues, hospitalization.  Patient has no family.  He does not have any healthcare power of attorney.  Palliative care team and social worker were following him in the hospital, he did not come to any conclusion.  He only has a caregiver locally.  He is estranged to his son.   1/21, patient was discharged to a skilled nursing facility and he declined to go. 1/22, patient is medically stable.  He does not want to go to skilled nursing facility.  Discharge was canceled.  He wants to go home.  He has 24-hour caregiver at home. He will be going home with caregiver, home health infusion orders are placed.  He has tunneled catheter that needs to come out after completing antibiotic therapy.  Stable to be discharged home with supervision.  Discharge Diagnoses:  Principal Problem:   Septic arthritis of shoulder ([place]) Active Problems:   Hyperlipidemia   Essential hypertension   BPH (benign prostatic hyperplasia)   Hyponatremia   Generalized anxiety disorder   Restless leg syndrome   Chronic systolic congestive heart failure, NYHA class 3 (HCC)   Hyperlipidemia associated with type 2 diabetes mellitus (HCC)   Diabetes mellitus type II, non insulin dependent ([place])   AKI (acute kidney injury) ([place])   Osteomyelitis of right humerus ([place])   Encephalopathy acute    Discharge Instructions  Discharge Instructions    [facility] pharmacist to adjust dose for Vancomycin, Aminoglycosides and other anti-infective therapies as requested by physician.   Complete by: As directed    [facility] pharmacist to adjust dose for Vancomycin, Aminoglycosides and other anti-infective therapies as requested by  physician.   Complete by: As directed    [facility] to provide Cath Flo 575m   Complete by: As directed    Administer for PICC line occlusion and as ordered by physician for other access device issues.   [facility] to provide Cath Flo 242m  Complete by: As directed    Administer for PICC line occlusion and as ordered by physician for other access device issues.   Anaphylaxis Kit: Provided to treat any anaphylactic reaction to the medication being provided to the patient if First Dose or when requested by physician   Complete by: As directed    Epinephrine 68m268ml vial / amp: Administer 0.75mg375m.75ml)275mbcutaneously once for moderate to severe anaphylaxis, nurse to call physician and pharmacy when reaction occurs and call 911 if needed for immediate care   Diphenhydramine 50mg/65mV vial: Administer 25-50mg I47m PRN for first dose reaction, rash, itching, mild reaction, nurse to call physician and pharmacy when reaction occurs   Sodium Chloride 0.9% NS 500ml IV26mminister if needed for hypovolemic blood pressure drop or as ordered by physician after call to physician with anaphylactic reaction   Anaphylaxis Kit: Provided to treat any anaphylactic reaction to the medication being provided to the patient if First Dose or when requested by physician   Complete by: As directed    Epinephrine 68mg/ml v22m / amp: Administer 0.75mg (0.75m29msubcu26meously once for moderate to severe anaphylaxis, nurse to call physician and pharmacy when reaction occurs and call 911 if needed for immediate care   Diphenhydramine 50mg/ml IV 46m: Administer 25-50mg IV/IM P69mor first dose  reaction, rash, itching, mild reaction, nurse to call physician and pharmacy when reaction occurs   Sodium Chloride 0.9% NS 529m IV: Administer if needed for hypovolemic blood pressure drop or as ordered by physician after call to physician with anaphylactic reaction   Call MD for:  difficulty breathing, headache or  visual disturbances   Complete by: As directed    Call MD for:  redness, tenderness, or signs of infection (pain, swelling, redness, odor or green/yellow discharge around incision site)   Complete by: As directed    Call MD for:  temperature >100.4   Complete by: As directed    Call MD for:  temperature >100.4   Complete by: As directed    Change dressing on IV access line weekly and PRN   Complete by: As directed    Change dressing on IV access line weekly and PRN   Complete by: As directed    Diet - low sodium heart healthy   Complete by: As directed    Diet Carb Modified   Complete by: As directed    Aspiration precautions, fine chopped food and thin liquid   Discharge instructions   Complete by: As directed    Please refer to radiology to remove indwelling catheter after completing antibiotics.   Flush IV access with Sodium Chloride 0.9% and Heparin 10 units/ml or 100 units/ml   Complete by: As directed    Flush IV access with Sodium Chloride 0.9% and Heparin 10 units/ml or 100 units/ml   Complete by: As directed    Home infusion instructions - [facility]   Complete by: As directed    Instructions: Flush IV access with Sodium Chloride 0.9% and Heparin 10units/ml or 100units/ml   Change dressing on IV access line: Weekly and PRN   Instructions Cath Flo 243m Administer for PICC Line occlusion and as ordered by physician for other access device   [facility] pharmacist to adjust dose for: Vancomycin, Aminoglycosides and other anti-infective therapies as requested by physician   Home infusion instructions - [facility]   Complete by: As directed    Instructions: Flush IV access with Sodium Chloride 0.9% and Heparin 10units/ml or 100units/ml   Change dressing on IV access line: Weekly and PRN   Instructions Cath Flo 100m25mAdminister for PICC Line occlusion and as ordered by physician for other access device   [facility] pharmacist to  adjust dose for: Vancomycin, Aminoglycosides and other anti-infective therapies as requested by physician   Increase activity slowly   Complete by: As directed    Increase activity slowly   Complete by: As directed    Method of administration may be changed at the discretion of home infusion pharmacist based upon assessment of the patient and/or caregiver's ability to self-administer the medication ordered   Complete by: As directed    Method of administration may be changed at the discretion of home infusion pharmacist based upon assessment of the patient and/or caregiver's ability to self-administer the medication ordered   Complete by: As directed    No wound care   Complete by: As directed    No wound care   Complete by: As directed    Outpatient Parenteral Antibiotic Therapy Information Antibiotic: Piperacillin-Tazobactam (Zosyn) IVPB; Indications for use: osteomyelitis; End Date: 09/12/2020   Complete by: As directed    Antibiotic: Piperacillin-Tazobactam (Zosyn) IVPB   Indications for use: osteomyelitis   End Date: 09/12/2020     Allergies as of 09/06/2020  Reactions   Ivp Dye [iodinated Diagnostic Agents]    Blood Pressure dropped----pt was pre-medicated with 13 hour prep and did fine with pre-meds--amy 03/09/11   Sulfa Antibiotics Hives   Sulfonamide Derivatives Hives, Itching   Lisinopril Cough   Metoprolol Nausea And Vomiting   Statins Other (See Comments)   myalgias   Propoxyphene N-acetaminophen Other (See Comments)   Sharp pains- headache      Medication List    STOP taking these medications   ALLERGEN OT   ceFEPime  IVPB Commonly known as: MAXIPIME   celecoxib 200 MG capsule Commonly known as: CELEBREX   Entresto 49-51 MG Generic drug: sacubitril-valsartan   furosemide 20 MG tablet Commonly known as: LASIX   Januvia 100 MG tablet Generic drug: sitaGLIPtin   oxyCODONE-acetaminophen 10-325 MG tablet Commonly known as: PERCOCET     TAKE these  medications   Accu-Chek Aviva Plus w/Device Kit Use as directed to check blood glucose daily. Dx: E11.51   Accu-Chek FastClix Lancets Misc Use as directed to check blood glucose. Dx: E11.51   acetaminophen 500 MG tablet Commonly known as: TYLENOL Take 1,000 mg by mouth every 6 (six) hours as needed for mild pain, fever or headache.   albuterol 108 (90 Base) MCG/ACT inhaler Commonly known as: VENTOLIN HFA Inhale 2 puffs into the lungs every 6 (six) hours as needed for wheezing or shortness of breath.   ARIPiprazole 5 MG tablet Commonly known as: ABILIFY Take 1 tablet (5 mg total) by mouth daily. What changed:   medication strength  how much to take  how to take this  when to take this  additional instructions   aspirin EC 81 MG tablet Take 1 tablet (81 mg total) by mouth daily.   Brilinta 90 MG Tabs tablet Generic drug: ticagrelor Take 90 mg by mouth 2 (two) times daily.   buPROPion 300 MG 24 hr tablet Commonly known as: Wellbutrin XL Take 1 tablet (300 mg total) by mouth daily.   carvedilol 12.5 MG tablet Commonly known as: COREG Take 1 tablet (12.5 mg total) by mouth 2 (two) times daily with a meal. What changed:   medication strength  how much to take   clonazePAM 0.5 MG tablet Commonly known as: KLONOPIN Take 1 tablet (0.5 mg total) by mouth 2 (two) times daily as needed for up to 7 days for anxiety.   gabapentin 100 MG capsule Commonly known as: NEURONTIN Take 1 capsule (100 mg total) by mouth 2 (two) times daily. What changed:   medication strength  how much to take  how to take this  when to take this  additional instructions   glucose blood test strip Commonly known as: Accu-Chek Aviva Plus 1 each by Other route as needed for other. Use as instructed. Dx: E11.51   insulin aspart 100 UNIT/ML injection Commonly known as: novoLOG Inject 0-9 Units into the skin 3 (three) times daily with meals.   IRON PO Take 1 capsule by mouth daily.    isosorbide mononitrate 60 MG 24 hr tablet Commonly known as: IMDUR Take 60 mg by mouth daily.   multivitamin with minerals Tabs tablet Take 1 tablet by mouth daily.   Narcan 4 MG/0.1ML Liqd nasal spray kit Generic drug: naloxone Place 4 mg into the nose as needed (opioid reversal).   nitroGLYCERIN 0.4 MG SL tablet Commonly known as: NITROSTAT PLACE 1 TABLET UNDER THE TONGUE EVERY 5 MINUTES AS NEEDED FOR CHEST PAIN What changed: See the new instructions.  pantoprazole 40 MG tablet Commonly known as: PROTONIX Take 1 tablet (40 mg total) by mouth 2 (two) times daily.   piperacillin-tazobactam  IVPB Commonly known as: ZOSYN Inject 3.375 g into the vein every 8 (eight) hours. Indication:  Septic arthritis First Dose: No Last Day of Therapy:  09/12/20 Labs - Once weekly:  CBC/D and BMP, Labs - Every other week:  ESR and CRP Method of administration: Elastomeric (Continuous infusion) Method of administration may be changed at the discretion of home infusion pharmacist based upon assessment of the patient and/or caregiver's ability to self-administer the medication ordered.   piperacillin-tazobactam 3.375 GM/50ML IVPB Commonly known as: ZOSYN Inject 50 mLs (3.375 g total) into the vein every 8 (eight) hours for 8 days.   rOPINIRole 1 MG tablet Commonly known as: REQUIP Take 1 tablet (1 mg total) by mouth in the morning and at bedtime.   tamsulosin 0.4 MG Caps capsule Commonly known as: FLOMAX Take 1 capsule (0.4 mg total) by mouth 2 (two) times daily.   Vitamin D3 125 MCG (5000 UT) Tabs Take 5,000 Units by mouth daily.            Discharge Care Instructions  (From admission, onward)         Start     Ordered   09/04/20 0000  Change dressing on IV access line weekly and PRN  (Home infusion instructions - [facility] )        09/04/20 0827   09/04/20 0000  Change dressing on IV access line weekly and PRN  (Home infusion instructions - [facility] )        09/04/20 1056          Contact information for follow-up providers    [place], [place] Follow up in 3 week(s).   Why: We will call with appt details Contact information: [address] [place] [address] [phone]            Contact information for after-discharge care    Destination    [facility] .   Service: Skilled Nursing Contact information: [address] [address] [phone]                 Allergies  Allergen Reactions  . Ivp Dye [Iodinated Diagnostic Agents]     Blood Pressure dropped----pt was pre-medicated with 13 hour prep and did fine with pre-meds--amy 03/09/11   . Sulfa Antibiotics Hives  . Sulfonamide Derivatives Hives and Itching  . Lisinopril Cough  . Metoprolol Nausea And Vomiting  . Statins Other (See Comments)    myalgias  . Propoxyphene N-Acetaminophen Other (See Comments)    Sharp pains- headache    Consultations:  Palliative medicine  Infectious disease  Orthopedics  Nephrology   Procedures/Studies: CT HEAD WO CONTRAST  Result Date: 08/24/2020 CLINICAL DATA:  Obtunded, rule out stroke EXAM: CT HEAD WITHOUT CONTRAST TECHNIQUE: Contiguous axial images were obtained from the base of the skull through the vertex without intravenous contrast. COMPARISON:  12/31/2019 FINDINGS: Brain: No evidence of acute infarction, hemorrhage, hydrocephalus, extra-axial collection or mass lesion/mass effect. Chronic small vessel ischemia in the cerebral white matter. Small remote left cerebellar infarction. Brain atrophy. Vascular: No hyperdense vessel or unexpected calcification. Skull: Negative for fracture or focal lesion. Sinuses/Orbits: Postoperative paranasal sinuses, including frontal sinus obliteration. IMPRESSION: Senescent changes without acute or reversible finding. Electronically Signed   By: Monte Fantasia M.D.   On: 08/24/2020 11:26  MR BRAIN WO CONTRAST  Result  Date: 08/31/2020 CLINICAL DATA:  75 year old male with unexplained altered mental status. Acute kidney injury, dialysis. EXAM: MRI HEAD WITHOUT CONTRAST TECHNIQUE: Multiplanar, multiecho pulse sequences of the brain and surrounding structures were obtained without intravenous contrast. COMPARISON:  Head CT 08/24/2020.  Brain MRI 06/20/2019. FINDINGS: Brain: Stable cerebral volume from the prior MRI. No convincing restricted diffusion. No midline shift, mass effect, evidence of mass lesion, ventriculomegaly, extra-axial collection or acute intracranial hemorrhage. Cervicomedullary junction and pituitary are within normal limits. Confluent bilateral periventricular white matter T2 and FLAIR hyperintensity has progressed since 2020, along with patchy and widely scattered central and subcortical white matter signal changes, especially in the anterior frontal lobes. No cortical encephalomalacia or chronic cerebral blood products identified. Deep gray nuclei, brainstem and cerebellum are stable and within normal limits for age. Vascular: Major intracranial vascular flow voids are stable since 2020. Skull and upper cervical spine: Degenerative ligamentous hypertrophy about the odontoid, and C3-C4 disc protrusion visible today on series 9, image 13 resulting in degenerative spinal stenosis with mild spinal cord mass effect. Visualized bone marrow signal is within normal limits. Sinuses/Orbits: Prior sinus surgery including chronic postoperative changes to the bilateral frontal bones with fat packing and residual mucosal disease, better detailed on CT. Stable sinus aeration since 2020. Globes appear stable. Other: Mastoids remain well aerated. Grossly normal visible internal auditory structures. Negative visible scalp and face. IMPRESSION: 1. No acute intracranial abnormality. 2. Advanced bilateral cerebral white matter disease with progression since 2020, nonspecific but most commonly due to chronic small vessel ischemia.  3. 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 4. Chronic sinus disease and postoperative changes. Electronically Signed   By: Genevie Ann M.D.   On: 08/31/2020 04:17   US RENAL  Result Date: 08/23/2020 CLINICAL DATA:  Acute renal failure EXAM: RENAL / URINARY TRACT ULTRASOUND COMPLETE COMPARISON:  CT 09/21/2010 FINDINGS: Right Kidney: Renal measurements: 13.3 x 6.2 x 5.3 cm = volume: 223 mL. Increased echogenicity. No mass or hydronephrosis visualized. Septated cyst measuring 3 cm in size. Left Kidney: Renal measurements: 14.0 x 8.6 x 7.2 cm = volume: 454 mL. Increased echogenicity. No mass or hydronephrosis visualized. Nonobstructing 4 mm stone in the midportion. Bladder: Appears normal for degree of bladder distention. Other: None. IMPRESSION: Enlarged echogenic kidneys consistent with acute nephritis. No hydronephrosis. Electronically Signed   By: Nelson Chimes M.D.   On: 08/23/2020 12:19   IR Fluoro Guide CV Line Left  Result Date: 08/25/2020 INDICATION: 83 year old with acute kidney injury and needs hemodialysis catheter. EXAM: FLUOROSCOPIC AND ULTRASOUND GUIDED PLACEMENT OF A NON-TUNNELED DIALYSIS CATHETER Physician: Stephan Minister. Henn, MD MEDICATIONS: None ANESTHESIA/SEDATION: None FLUOROSCOPY TIME:  Fluoroscopy Time: 30 seconds, 2 mGy COMPLICATIONS: None immediate. PROCEDURE: Informed consent could not be obtained because of the patient's altered mental status. Emergency consent was obtained. The patient was placed supine on the interventional table. Ultrasound confirmed a patent left internal vein. Ultrasound images were obtained for documentation. The left neck was prepped and draped in a sterile fashion. The left neck was anesthetized with 1% lidocaine. Maximal barrier sterile technique was utilized including caps, mask, sterile gowns, sterile gloves, sterile drape, hand hygiene and skin antiseptic. A small incision was made with #11 blade scalpel. A 21  gauge needle directed into the left internal jugular vein with ultrasound guidance. A micropuncture dilator set was placed. A 20 cm Mahurkar catheter was selected. The catheter was advanced over  a wire and positioned in the superior vena cava. Fluoroscopic images were obtained for documentation. Both dialysis lumens were found to aspirate and flush well. The proper amount of heparin was flushed in both lumens. The central venous lumen was flushed with normal saline. Catheter was sutured to skin. FINDINGS: Dialysis catheter tip in the superior vena cava. Existing tunneled right jugular central venous catheter with tip at the superior cavoatrial junction. IMPRESSION: Successful placement of a left jugular non-tunneled dialysis catheter using ultrasound and fluoroscopic guidance. Electronically Signed   By: Markus Daft M.D.   On: 08/25/2020 17:56   IR US Guide Vasc Access Left  Result Date: 08/25/2020 INDICATION: 42 year old with acute kidney injury and needs hemodialysis catheter. EXAM: FLUOROSCOPIC AND ULTRASOUND GUIDED PLACEMENT OF A NON-TUNNELED DIALYSIS CATHETER Physician: Stephan Minister. Henn, MD MEDICATIONS: None ANESTHESIA/SEDATION: None FLUOROSCOPY TIME:  Fluoroscopy Time: 30 seconds, 2 mGy COMPLICATIONS: None immediate. PROCEDURE: Informed consent could not be obtained because of the patient's altered mental status. Emergency consent was obtained. The patient was placed supine on the interventional table. Ultrasound confirmed a patent left internal vein. Ultrasound images were obtained for documentation. The left neck was prepped and draped in a sterile fashion. The left neck was anesthetized with 1% lidocaine. Maximal barrier sterile technique was utilized including caps, mask, sterile gowns, sterile gloves, sterile drape, hand hygiene and skin antiseptic. A small incision was made with #11 blade scalpel. A 21 gauge needle directed into the left internal jugular vein with ultrasound guidance. A micropuncture  dilator set was placed. A 20 cm Mahurkar catheter was selected. The catheter was advanced over a wire and positioned in the superior vena cava. Fluoroscopic images were obtained for documentation. Both dialysis lumens were found to aspirate and flush well. The proper amount of heparin was flushed in both lumens. The central venous lumen was flushed with normal saline. Catheter was sutured to skin. FINDINGS: Dialysis catheter tip in the superior vena cava. Existing tunneled right jugular central venous catheter with tip at the superior cavoatrial junction. IMPRESSION: Successful placement of a left jugular non-tunneled dialysis catheter using ultrasound and fluoroscopic guidance. Electronically Signed   By: Markus Daft M.D.   On: 08/25/2020 17:56   DG CHEST PORT 1 VIEW  Result Date: 09/05/2020 CLINICAL DATA:  Acute respiratory failure EXAM: PORTABLE CHEST 1 VIEW COMPARISON:  None. FINDINGS: The heart size and mediastinal contours are within normal limits. Overlying median sternotomy wires are present. There is prominence of the central pulmonary vasculature with diffusely increased interstitial markings seen throughout both lungs. A right-sided catheter seen with the tip at the superior cavoatrial junction. Bilateral shoulder osteoarthritis is noted. IMPRESSION: Diffusely increased interstitial markings which could be due to interstitial edema. Electronically Signed   By: Prudencio Pair M.D.   On: 09/05/2020 01:58   DG CHEST PORT 1 VIEW  Result Date: 08/26/2020 CLINICAL DATA:  Hypoxia. EXAM: PORTABLE CHEST 1 VIEW COMPARISON:  Single-view of the chest 08/23/2020. FINDINGS: New tunnel dialysis catheter with its tip in the superior vena cava noted. Right IJ catheter is unchanged. Lung volumes are low with mild basilar atelectasis. Heart size is mildly enlarged. The patient is status post CABG. No pneumothorax or pleural effusion. IMPRESSION: No acute disease. Electronically Signed   By: Inge Rise M.D.   On:  08/26/2020 10:06   DG CHEST PORT 1 VIEW  Result Date: 08/23/2020 CLINICAL DATA:  75 year old male with shortness of breath EXAM: PORTABLE CHEST 1 VIEW COMPARISON:  Chest radiograph dated 07/13/2020. FINDINGS:  Central venous line with tip at the cavoatrial junction. Cardiomegaly with mild vascular congestion. No focal consolidation, pleural effusion or pneumothorax. Median sternotomy wires. No acute osseous pathology. IMPRESSION: Cardiomegaly with mild vascular congestion. No focal consolidation. Electronically Signed   By: Anner Crete M.D.   On: 08/23/2020 21:57   DG Abd Portable 1V  Result Date: 08/23/2020 CLINICAL DATA:  MRI clearance EXAM: PORTABLE ABDOMEN - 1 VIEW COMPARISON:  None. FINDINGS: No metallic foreign body was identified in the abdomen or pelvis. Advanced vascular calcifications are noted. The bowel gas pattern is nonobstructive. There is a moderate amount of stool in the colon. Degenerative changes are noted throughout the lumbar spine and bilateral hips. IMPRESSION: 1. No metallic foreign body identified in the abdomen or pelvis. Advanced vascular calcifications are noted. 2. Nonobstructive bowel gas pattern. Moderate stool burden. Electronically Signed   By: Constance Holster M.D.   On: 08/23/2020 22:04   DG Swallowing Func-Speech Pathology  Result Date: 08/28/2020 Objective Swallowing Evaluation: Type of Study: MBS-Modified Barium Swallow Study  Patient Details Name: Martyn Timme MRN: [id number] Date of Birth: 10/10/1945 Today's Date: 08/28/2020 Time: SLP Start Time (ACUTE ONLY): 1300 -SLP Stop Time (ACUTE ONLY): 1319 SLP Time Calculation (min) (ACUTE ONLY): 19 min Past Medical History: Past Medical History: Diagnosis Date . Acute renal failure (ARF) ([place]) 08/21/2020 . Anemia   NOS . Anxiety  . Blindness of left eye   near blindness. 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 BPH (benign prostatic hypertrophy)  . Broken foot Oct. 12, 2013  Right foot Fx . CAD (coronary artery disease)  . Carotid stenosis,  left  . Cellulitis and abscess of leg 03/2018  right leg . Chronic hyponatremia  . Chronic pain syndrome  . COPD (chronic obstructive pulmonary disease) ([place])  . Depression  . Diabetes mellitus  . Diabetic foot ulcer ([place])  . DJD (degenerative joint disease)   L wrist . DM2 (diabetes mellitus, type 2) ([place])  . GERD (gastroesophageal reflux disease)  . HLD (hyperlipidemia)  . HTN (hypertension)  . Hx of blood transfusion reaction  . Hyponatremia  . Lumbar disc disease  . MI (mitral incompetence)  . Myocardial infarction ([place])   x 2 . Osteoporosis   tx per [facility], Dr Prudencio Burly, thought due to heavy steriod use after 1978 . Peripheral neuropathy  . Peripheral vascular disease ([place])  . Restless leg syndrome  . Spine fracture   hx, multiple . Varicose veins  . 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 Vitamin D deficiency  Past Surgical History: Past Surgical History: Procedure Laterality Date . ANGIOPLASTY   . aorta bifemoral bypass grafting  09/2010 . CARDIAC CATHETERIZATION N/A 09/19/2015  Procedure: Left Heart Cath and Coronary Angiography;  Surgeon: Adrian Prows, MD;  Location: [facility];  Service: Cardiovascular;  Laterality: N/A; . CARPAL TUNNEL RELEASE    right 2006/ left 2007 . CATARACT EXTRACTION    left 1996/ right 1997 . CORONARY ARTERY BYPASS GRAFT N/A 09/22/2015  Procedure: CORONARY ARTERY BYPASS GRAFTING (CABG) times four using the right greater saphenous vein harvested endoscopically and the left internal mammary artery.  LIMA-LAD, SEQ SVG-DIAG & OM, SVG-PD.;  Surgeon: [name], MD;  Location: [facility];  Service: Open Heart Surgery;  Laterality: N/A; . I & D EXTREMITY Right 04/07/2018  Procedure: IRRIGATION AND DEBRIDEMENT ABSCESS RIGHT LEG;  Surgeon: Newt Minion, MD;  Location: [facility];  Service: Orthopedics;  Laterality: Right; . INCISION AND DRAINAGE OF WOUND Right 05/07/2020  Procedure: RIGHT LONG FINGER IRRIGATION AND DEBRIDEMENT AND AMPUTATION;  Surgeon: Iran Planas, MD;  Location: [facility];  Service: Orthopedics;  Laterality: Right;  with IV sedation . INCISION AND DRAINAGE OF WOUND Right 05/12/2020  Procedure: IRRIGATION AND DEBRIDEMENT WOUND and possible revision amputation;  Surgeon: Iran Planas, MD;  Location: [facility];  Service: Orthopedics;  Laterality: Right;  needs 60 minutes . IR FLUORO GUIDE CV LINE LEFT  08/25/2020 . IR FLUORO GUIDE CV LINE RIGHT  07/18/2020 . IR US GUIDE VASC ACCESS LEFT  08/25/2020 . IR US GUIDE VASC ACCESS RIGHT  07/18/2020 . L foot open repair jones fracture  2010   5th metetarsal  . left ankle ganglion cyst  1976 . left carotid endarterectomy  03/2011 . left cataract  1996  right - 1997 . left CTS  2006  R CTS - 2007 . left foot surgery  1998  R surgery/fracture - 1999 . left plantar ganglion cystectomy  1979 . left wrist/hand fusion  2008 . [place], 2004 . NASAL SINUS SURGERY    multiple- x8. last 1997 with obliteration . REPAIR THORACIC AORTA  2017 . right hand fracture  1969 . ROTATOR CUFF REPAIR  2006  R, than repeat 2011, Dr Theda Sers . SHOULDER ARTHROSCOPY Bilateral 07/17/2020  Procedure: Right shoulder arthroscopic lavage; Left shoulder aspiration.;  Surgeon: Justice Britain, MD;  Location[facility];  Service: Orthopedics;  Laterality: Bilateral;  157mn . SHOULDER ARTHROSCOPY WITH LABRAL REPAIR Left 07/10/2020  Procedure: SHOULDER ARTHROSCOPY WITH WASHOUT;  Surgeon: SJustice Britain MD;  Location[facility];  Service: Orthopedics;  Laterality: Left; . TRIGGER FINGER RELEASE Left 11/11/2014  Procedure: LEFT LONG FINGER RELEASE TRIGGER FINGER/A-1 PULLEY;  Surgeon: DMilly Jakob MD;  [facility]  Service: Orthopedics;  Laterality: Left; . vocal surgery  1996 HPI: Pt is a 75y.o. male presenting with septic arthritis of shoulder and AMS. He has a history of chronic systold heart failure, DM with completication of neuropathy, PAD, ASVD diffuse, and chronic shoulder problems. Pt also has a history of GERD, blindness of left eye, COPD, and  a left carotid endarterectomy. He also had vocal fold surgery in 1996. 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 Pt did not follow up for further treatment.  Subjective: Pt was agreeable and alert Assessment / Plan / Recommendation CHL IP CLINICAL IMPRESSIONS 08/28/2020 Clinical Impression Pt presents with oropharyngeal dysphagia characterized by reduced bolus cohesion, a pharyngeal delay, reduced lingual retraction, and reduced anterior laryngeal movement. He demonstrated premature spillage to the valleculae and pyriform sinuses, mild-moderate vallecular residue, and pyriform sinus residue. Amount of residue increased with bolus size and penetration (PAS 3) of pyriform sinus residue was intermittently noted on secondary swallows of full-tsp boluses of solids. Pt inconsistently reported that he swallowed solid boluses once they had passed the posterior faucial arches and required additional boluses to be provided for a swallow to be triggered. The swallow was often triggered with >50% of the liquid bolus at the level of the pyriform sinuses and this resulted in aspiration (PAS 7) of thin and nectar thick liquids with delayed laryngeal sensation. Pt's cough was weak and propelled the aspirate above the vocal folds, but did not expel it from the larynx and it was subsequently aspirated. Postural modifications (i.e., chin tuck and L/R head turn) and use of effortful swallows were ineffective in eliminating laryngeal invasion, but no instances of penetration/aspiration  were noted with honey thick liquids. A dysphagia 2 diet with honey thick liquids is recommended at this time with strict observance of swallowing precautions and full supervision to ensure observance. SLP will follow for dysphagia treatment. SLP Visit Diagnosis Dysphagia, oropharyngeal phase (R13.12) Attention and concentration deficit following -- Frontal lobe and executive function deficit  following -- Impact on safety and function Moderate aspiration risk   CHL IP TREATMENT RECOMMENDATION 08/28/2020 Treatment Recommendations Therapy as outlined in treatment plan below   Prognosis 08/28/2020 Prognosis for Safe Diet Advancement Fair Barriers to Reach Goals Cognitive deficits;Time post onset Barriers/Prognosis Comment -- CHL IP DIET RECOMMENDATION 08/28/2020 SLP Diet Recommendations Dysphagia 2 (Fine chop) solids;Honey thick liquids Liquid Administration via Cup;Straw Medication Administration Crushed with puree Compensations Slow rate;Small sips/bites;Follow solids with liquid;Minimize environmental distractions;Multiple dry swallows after each bite/sip Postural Changes Seated upright at 90 degrees   CHL IP OTHER RECOMMENDATIONS 08/28/2020 Recommended Consults -- Oral Care Recommendations Oral care BID Other Recommendations --   CHL IP FOLLOW UP RECOMMENDATIONS 08/28/2020 Follow up Recommendations (No Data)   CHL IP FREQUENCY AND DURATION 08/28/2020 Speech Therapy Frequency (ACUTE ONLY) min 2x/week Treatment Duration 2 weeks      CHL IP ORAL PHASE 08/28/2020 Oral Phase Impaired Oral - Pudding Teaspoon -- Oral - Pudding Cup -- Oral - Honey Teaspoon -- Oral - Honey Cup Decreased bolus cohesion;Premature spillage Oral - Nectar Teaspoon -- Oral - Nectar Cup Decreased bolus cohesion;Premature spillage Oral - Nectar Straw Decreased bolus cohesion;Premature spillage Oral - Thin Teaspoon Decreased bolus cohesion;Premature spillage Oral - Thin Cup Decreased bolus cohesion;Premature spillage Oral - Thin Straw Decreased bolus cohesion;Premature spillage Oral - Puree -- Oral - Mech Soft -- Oral - Regular -- Oral - Multi-Consistency -- Oral - Pill Reduced posterior propulsion Oral Phase - Comment --  CHL IP PHARYNGEAL PHASE 08/28/2020 Pharyngeal Phase Impaired Pharyngeal- Pudding Teaspoon -- Pharyngeal -- Pharyngeal- Pudding Cup -- Pharyngeal -- Pharyngeal- Honey Teaspoon -- Pharyngeal -- Pharyngeal- Honey Cup Reduced  tongue base retraction;Reduced anterior laryngeal mobility;Delayed swallow initiation-vallecula;Delayed swallow initiation-pyriform sinuses;Pharyngeal residue - valleculae;Pharyngeal residue - pyriform Pharyngeal -- Pharyngeal- Nectar Teaspoon -- Pharyngeal -- Pharyngeal- Nectar Cup Reduced tongue base retraction;Reduced anterior laryngeal mobility;Delayed swallow initiation-vallecula;Delayed swallow initiation-pyriform sinuses;Pharyngeal residue - valleculae;Pharyngeal residue - pyriform;Penetration/Aspiration during swallow;Penetration/Apiration after swallow;Moderate aspiration Pharyngeal Material enters airway, passes BELOW cords and not ejected out despite cough attempt by patient Pharyngeal- Nectar Straw Reduced tongue base retraction;Reduced anterior laryngeal mobility;Delayed swallow initiation-vallecula;Delayed swallow initiation-pyriform sinuses;Pharyngeal residue - valleculae;Pharyngeal residue - pyriform;Penetration/Aspiration during swallow;Penetration/Apiration after swallow;Moderate aspiration Pharyngeal Material enters airway, passes BELOW cords and not ejected out despite cough attempt by patient Pharyngeal- Thin Teaspoon Reduced tongue base retraction;Reduced anterior laryngeal mobility;Delayed swallow initiation-vallecula;Delayed swallow initiation-pyriform sinuses;Pharyngeal residue - valleculae;Pharyngeal residue - pyriform;Penetration/Aspiration during swallow;Penetration/Apiration after swallow;Moderate aspiration Pharyngeal Material enters airway, passes BELOW cords and not ejected out despite cough attempt by patient;Material enters airway, remains ABOVE vocal cords and not ejected out Pharyngeal- Thin Cup Reduced tongue base retraction;Reduced anterior laryngeal mobility;Delayed swallow initiation-vallecula;Delayed swallow initiation-pyriform sinuses;Pharyngeal residue - valleculae;Pharyngeal residue - pyriform;Penetration/Aspiration during swallow;Penetration/Apiration after  swallow;Moderate aspiration Pharyngeal Material enters airway, passes BELOW cords and not ejected out despite cough attempt by patient Pharyngeal- Thin Straw Reduced tongue base retraction;Reduced anterior laryngeal mobility;Delayed swallow initiation-vallecula;Delayed swallow initiation-pyriform sinuses;Pharyngeal residue - valleculae;Pharyngeal residue - pyriform;Penetration/Aspiration during swallow;Penetration/Apiration after swallow;Moderate aspiration Pharyngeal Material enters airway, passes BELOW cords and not ejected out despite cough attempt by patient Pharyngeal- Puree Reduced tongue base retraction;Reduced anterior laryngeal mobility;Delayed swallow initiation-vallecula;Delayed swallow initiation-pyriform  sinuses;Pharyngeal residue - valleculae;Pharyngeal residue - pyriform;Penetration/Aspiration during swallow;Penetration/Apiration after swallow;Moderate aspiration Pharyngeal Material enters airway, remains ABOVE vocal cords and not ejected out Pharyngeal- Mechanical Soft -- Pharyngeal -- Pharyngeal- Regular Reduced tongue base retraction;Reduced anterior laryngeal mobility;Delayed swallow initiation-vallecula;Delayed swallow initiation-pyriform sinuses;Pharyngeal residue - valleculae;Pharyngeal residue - pyriform;Penetration/Aspiration during swallow;Penetration/Apiration after swallow;Moderate aspiration Pharyngeal Material enters airway, remains ABOVE vocal cords and not ejected out Pharyngeal- Multi-consistency -- Pharyngeal -- Pharyngeal- Pill Reduced tongue base retraction;Reduced anterior laryngeal mobility;Delayed swallow initiation-vallecula;Delayed swallow initiation-pyriform sinuses;Pharyngeal residue - valleculae;Pharyngeal residue - pyriform;Penetration/Aspiration during swallow;Penetration/Apiration after swallow;Moderate aspiration Pharyngeal -- Pharyngeal Comment --  CHL IP CERVICAL ESOPHAGEAL PHASE 08/28/2020 Cervical Esophageal Phase WFL Pudding Teaspoon -- Pudding Cup -- Honey Teaspoon  -- Honey Cup -- Nectar Teaspoon -- Nectar Cup -- Nectar Straw -- Thin Teaspoon -- Thin Cup -- Thin Straw -- Puree -- Mechanical Soft -- Regular -- Multi-consistency -- Pill -- Cervical Esophageal Comment -- Shanika I. Hardin Negus, [place], [place] Office number [phone] Pager [place] 08/28/2020, 2:08 PM              (Echo, Carotid, EGD, Colonoscopy, ERCP)    Subjective: Patient seen and examined.  He was very unhappy about last night's events and they came to pick him up at 11:00 at night. Today, he says he is back to himself.  He has a paid caregiver at home and wants to go home. Patient states that he is back to his baseline, he has all support at home.   Discharge Exam: Vitals:   09/06/20 0605 09/06/20 0744  BP:  (!) 132/55  Pulse: 81 85  Resp: 19 20  Temp:  98.2 F (36.8 C)  SpO2: 95% 94%   Vitals:   09/06/20 0411 09/06/20 0600 09/06/20 0605 09/06/20 0744  BP: 118/60   (!) 132/55  Pulse: 79 80 81 85  Resp: 20 19 19 20   Temp: 98.4 F (36.9 C)   98.2 F (36.8 C)  TempSrc: Oral   Oral  SpO2: 96% 92% 95% 94%  Weight:      Height:        General: Pt is alert, awake, not in acute distress Chronically sick looking.  Frail and debilitated gentleman who is alert and oriented x4. Cardiovascular: RRR, S1/S2 +, no rubs, no gallops Respiratory: CTA bilaterally, no wheezing, no rhonchi Right subclavian tunneled catheter present.  Nontender. Abdominal: Soft, NT, ND, bowel sounds + Extremities: no edema, no cyanosis He has venous engorgement of the legs, there is no edema.    The results of significant diagnostics from this hospitalization (including imaging, microbiology, ancillary and laboratory) are listed below for reference.     Microbiology: Recent Results (from the past 240 hour(s))  SARS CORONAVIRUS 2 (TAT 6-24 HRS) Nasopharyngeal Nasopharyngeal Swab     Status: None   Collection Time: 09/03/20  2:46 PM   Specimen:  Nasopharyngeal Swab  Result Value Ref Range Status   SARS Coronavirus 2 NEGATIVE NEGATIVE Final    Comment: (NOTE) SARS-CoV-2 target nucleic acids are NOT DETECTED.  The SARS-CoV-2 RNA is generally detectable in upper and lower respiratory specimens during the acute phase of infection. Negative results do not preclude SARS-CoV-2 infection, do not rule out co-infections with other pathogens, and should not be used as the sole basis for treatment or other patient management decisions. Negative results must be combined with clinical observations, patient history, and epidemiological information. The expected result is Negative.  Fact Sheet for Patients: SugarRoll.be  Fact  Sheet for Healthcare Providers: https://www.woods-mathews.com/  This test is not yet approved or cleared by the [place] FDA and  has been authorized for detection and/or diagnosis of SARS-CoV-2 by FDA under an Emergency Use Authorization (EUA). This EUA will remain  in effect (meaning this test can be used) for the duration of the COVID-19 declaration under Se ction 564(b)(1) of the Act, 21 U.S.C. section 360bbb-3(b)(1), unless the authorization is terminated or revoked sooner.  Performed at [facility], [place] [address], [place], [place] [address]      Labs: BNP (last 3 results) Recent Labs    05/02/20 1620 07/11/20 0425 08/22/20 2201  BNP 626.2* 2,329.1* 1,779.3*   Basic Metabolic Panel: Recent Labs  Lab 09/01/20 0500 09/02/20 0854 09/03/20 0910 09/04/20 0024 09/05/20 0609  NA 146* 145 144 143 142  K 3.6 3.5 3.3* 3.6 3.7  CL 110 109 110 110 106  CO2 23 21* 22 22 21*  GLUCOSE 148* 191* 236* 173* 154*  BUN 44* 40* 39* 38* 35*  CREATININE 3.39* 3.36* 3.37* 3.37* 3.27*  CALCIUM 8.4* 8.7* 8.5* 8.6* 8.7*  MG 2.0  --   --   --   --   PHOS  --  4.2  --   --   --    Liver Function Tests: Recent Labs  Lab 09/02/20 0854  ALBUMIN 2.1*   No  results for input(s): LIPASE, AMYLASE in the last 168 hours. No results for input(s): AMMONIA in the last 168 hours. CBC: Recent Labs  Lab 09/01/20 0500 09/02/20 0500 09/03/20 0415 09/04/20 0024 09/05/20 0609  WBC 7.1 6.8 6.5 5.6 6.8  NEUTROABS 4.6 4.1 4.0 3.2 4.9  HGB 8.9* 8.2* 8.0* 7.8* 8.8*  HCT 27.9* 27.3* 25.3* 24.3* 28.0*  MCV 92.1 94.5 93.4 93.1 93.0  PLT 219 210 218 218 279   Cardiac Enzymes: No results for input(s): CKTOTAL, CKMB, CKMBINDEX, TROPONINI in the last 168 hours. BNP: Invalid input(s): POCBNP CBG: Recent Labs  Lab 09/04/20 2120 09/05/20 0614 09/05/20 1130 09/05/20 2132 09/06/20 0622  GLUCAP 149* 148* 179* 275* 157*   D-Dimer No results for input(s): DDIMER in the last 72 hours. Hgb A1c No results for input(s): HGBA1C in the last 72 hours. Lipid Profile No results for input(s): CHOL, HDL, LDLCALC, TRIG, CHOLHDL, LDLDIRECT in the last 72 hours. Thyroid function studies No results for input(s): TSH, T4TOTAL, T3FREE, THYROIDAB in the last 72 hours.  Invalid input(s): FREET3 Anemia work up No results for input(s): VITAMINB12, FOLATE, FERRITIN, TIBC, IRON, RETICCTPCT in the last 72 hours. Urinalysis    Component Value Date/Time   COLORURINE YELLOW 08/24/2020 0748   APPEARANCEUR CLEAR 08/24/2020 0748   LABSPEC 1.012 08/24/2020 0748   PHURINE 6.0 08/24/2020 0748   GLUCOSEU NEGATIVE 08/24/2020 0748   GLUCOSEU NEGATIVE 11/15/2014 1003   HGBUR SMALL (A) 08/24/2020 0748   BILIRUBINUR NEGATIVE 08/24/2020 0748   KETONESUR NEGATIVE 08/24/2020 0748   PROTEINUR 100 (A) 08/24/2020 0748   UROBILINOGEN 0.2 11/15/2014 1003   NITRITE NEGATIVE 08/24/2020 0748   LEUKOCYTESUR SMALL (A) 08/24/2020 0748   Sepsis Labs Invalid input(s): PROCALCITONIN,  WBC,  LACTICIDVEN Microbiology Recent Results (from the past 240 hour(s))  SARS CORONAVIRUS 2 (TAT 6-24 HRS) Nasopharyngeal Nasopharyngeal Swab     Status: None   Collection Time: 09/03/20  2:46 PM   Specimen:  Nasopharyngeal Swab  Result Value Ref Range Status   SARS Coronavirus 2 NEGATIVE NEGATIVE Final    Comment: (NOTE) SARS-CoV-2 target nucleic acids  are NOT DETECTED.  The SARS-CoV-2 RNA is generally detectable in upper and lower respiratory specimens during the acute phase of infection. Negative results do not preclude SARS-CoV-2 infection, do not rule out co-infections with other pathogens, and should not be used as the sole basis for treatment or other patient management decisions. Negative results must be combined with clinical observations, patient history, and epidemiological information. The expected result is Negative.  Fact Sheet for Patients: SugarRoll.be  Fact Sheet for Healthcare Providers: https://www.woods-mathews.com/  This test is not yet approved or cleared by the [place] FDA and  has been authorized for detection and/or diagnosis of SARS-CoV-2 by FDA under an Emergency Use Authorization (EUA). This EUA will remain  in effect (meaning this test can be used) for the duration of the COVID-19 declaration under Se ction 564(b)(1) of the Act, 21 U.S.C. section 360bbb-3(b)(1), unless the authorization is terminated or revoked sooner.  Performed at [facility], [place] [address] [place],  [address]      Time coordinating discharge:  38 minutes  SIGNED:   Barb Merino, MD  [facility] 09/06/2020, 11:16 AM

## 2020-09-06 NOTE — Progress Notes (Signed)
Palliative: [name] declined to discharge to short-term rehab last night, stating that it was too late.  He, this morning, decided that he would not in fact did not go to short-term rehab, but instead go home.  His friend, [name], is to pick him up for transportation home.   Call to friend [name].  She states that she is able to pick up [name] for transportation home.  I share that if she finds that they are unable to care for him at home, that she should call his primary care physician, they will assist in getting him to [facility].  She states understanding.  Conference with attending, bedside nursing staff, transition of care team related to patient condition, needs, goals of care, calling PCP if [name] needs/changes his mind about short-term rehab at [facility].  Plan:  At this point home with home health through [facility].  Encouraged [name] to contact PCP if they discover they need short-term rehab.  Outpatient palliative to follow for goals of care/CODE STATUS discussions.  15 minutes Quinn Axe, NP Palliative medicine team  Team phone [phone] Greater than 50% of this time was spent counseling and coordinating care related to the above assessment and plan.

## 2020-09-06 NOTE — Plan of Care (Signed)
  Problem: Health Behavior/Discharge Planning: Goal: Ability to manage health-related needs will improve Outcome: Progressing   Problem: Education: Goal: Knowledge of General Education information will improve Description: Including pain rating scale, medication(s)/side effects and non-pharmacologic comfort measures Outcome: Not Progressing

## 2020-09-06 NOTE — Progress Notes (Signed)
[facility] arrived  on unit to pick patient up at 22:30. Patient flat out refuses to leave because it is too late for him to travel to [place] which is 47minutes from hospital. MD called to talk to patient about the scarcity of beds for patient being admitted at the ED. Patient states he had to wait at ED when he got admitted so he knows. Called patient's partner to convince him but he wouldn't even talk to the partner. He is adamant on leaving when the birds come out to chirp in the morning. He has agreed to leave at 9am.

## 2020-09-06 NOTE — Plan of Care (Signed)

## 2020-09-06 NOTE — Progress Notes (Signed)
Patient seen and examined.  Discharge summary reviewed as he does not want to go to skilled nursing facility. Patient has adequate support at home. Will discharge home with home health PT OT, RN.  Home health infusion orders. Will send outpatient palliative care referral to continue to follow him at home.  Total time spent: 20 minutes

## 2020-09-06 NOTE — TOC Transition Note (Addendum)
Transition of Care [facility]) - CM/SW Discharge Note   Patient Details  Name: [name] MRN: [id number] Date of Birth: Mar 18, 1946  Transition of Care [facility]) CM/SW Contact:  Ella Bodo, RN Phone Number: 09/06/2020, 1:03 PM   Clinical Narrative:  75 y.o. male with medical history significant of multiple medical problems including chronic systolic heart failure, DM with neuropathy, PAD, ASVD diffuse. He was admitted 07/09/20-08/01/20 for septic arthritis both shoulders with GBS and Pseudomonas with septic shock. He was also found to have osteomyelitis right humerus. Pt admitted on 08/22/2020 after outpatient appointment. Pt developed acute uremic encephalopathy and later underwent HDU during admission which helped improve mental status.   PTA, pt required assistance with ADLs; lives at home with partner, who is Spanish-speaking.  PT/OT recommending SNF placement, and bed arranged for [facility], however patient desires to discharge home with home health services as prior to admission.  Referral made to [facility] for RN/PT/OT; [place] following for continued IV antibiotic infusions at home.  Carolynn Sayers with [facility] working to finalize antibiotic regimen, with pharmacy's assistance.  Patient was receiving IV antibiotics at home prior to admission, and partner was administering meds.  Patient made aware of arrangements being made; he is anxious to go home, but understands that discharge plans are in progress.  Will notify patient and bedside nurse when discharge plans complete.  Discharge plans finalized with [facility].  Patient will receive IV antibiotic dose prior to discharge; meds will be delivered to patient's home for dose starting tomorrow.  Reviewed discharge plans with patient; he states he will call [facility] for transport home later today.       Final next level of care: [place] Barriers to Discharge: No  Barriers Identified   Patient Goals and CMS Choice Patient states their goals for this hospitalization and ongoing recovery are:: "I guess I gotta go there before I can go home" CMS Medicare.gov Compare Post Acute Care list provided to:: Patient Choice offered to / list presented to : Patient   Discharge Plan and Services   Discharge Planning Services: CM Consult Post Acute Care Choice: Home Health                    HH Arranged: RN,PT,OT [place] Agency: [facility] Date HH Agency Contacted: 09/06/20 Time [place]: [id number] Representative spoke with at [place]: [place] ([place]) Interventions     Readmission Risk Interventions No flowsheet data found.  Reinaldo Raddle, RN, BSN  Trauma/Neuro ICU Case Manager [phone]

## 2020-09-08 ENCOUNTER — Encounter: Payer: Self-pay | Admitting: Infectious Disease

## 2020-09-09 ENCOUNTER — Telehealth: Payer: Self-pay

## 2020-09-09 NOTE — Telephone Encounter (Signed)
Jeani Hawking, Pharmacist  with [facility] called regarding creatine of 3.25 on 09-08-2020.   Jeani Hawking has made Dr [name] aware of the lab results.   Laverle Patter, RN

## 2020-09-10 ENCOUNTER — Encounter: Payer: Self-pay | Admitting: Infectious Disease

## 2020-09-10 ENCOUNTER — Other Ambulatory Visit: Payer: Self-pay

## 2020-09-10 ENCOUNTER — Ambulatory Visit (INDEPENDENT_AMBULATORY_CARE_PROVIDER_SITE_OTHER): Payer: No Typology Code available for payment source | Admitting: Infectious Disease

## 2020-09-10 VITALS — BP 82/59 | HR 79

## 2020-09-10 DIAGNOSIS — R6251 Failure to thrive (child): Secondary | ICD-10-CM

## 2020-09-10 DIAGNOSIS — N179 Acute kidney failure, unspecified: Secondary | ICD-10-CM

## 2020-09-10 DIAGNOSIS — A401 Sepsis due to streptococcus, group B: Secondary | ICD-10-CM

## 2020-09-10 DIAGNOSIS — L8941 Pressure ulcer of contiguous site of back, buttock and hip, stage 1: Secondary | ICD-10-CM

## 2020-09-10 DIAGNOSIS — R6521 Severe sepsis with septic shock: Secondary | ICD-10-CM

## 2020-09-10 DIAGNOSIS — I5033 Acute on chronic diastolic (congestive) heart failure: Secondary | ICD-10-CM | POA: Diagnosis not present

## 2020-09-10 DIAGNOSIS — R627 Adult failure to thrive: Secondary | ICD-10-CM

## 2020-09-10 DIAGNOSIS — H538 Other visual disturbances: Secondary | ICD-10-CM

## 2020-09-10 DIAGNOSIS — M86221 Subacute osteomyelitis, right humerus: Secondary | ICD-10-CM

## 2020-09-10 DIAGNOSIS — A419 Sepsis, unspecified organism: Secondary | ICD-10-CM

## 2020-09-10 DIAGNOSIS — M00212 Other streptococcal arthritis, left shoulder: Secondary | ICD-10-CM

## 2020-09-10 DIAGNOSIS — L304 Erythema intertrigo: Secondary | ICD-10-CM

## 2020-09-10 HISTORY — DX: Other visual disturbances: H53.8

## 2020-09-10 MED ORDER — AMOXICILLIN 500 MG PO CAPS: 500.0000 mg | ORAL_CAPSULE | Freq: Two times a day (BID) | ORAL | 4 refills | Status: DC

## 2020-09-10 MED ORDER — NYSTATIN 100000 UNIT/GM EX POWD: 1.0000 "application " | Freq: Three times a day (TID) | CUTANEOUS | 0 refills | Status: AC

## 2020-09-10 NOTE — Progress Notes (Signed)
Subjective:  Chief complaints: Blurry vision weakness not making much urine, bedsore  Patient ID: [name], male    DOB: [birth year], 75 y.o.   MRN: [id number]  HPI  75 y.o. male with Group B streptococcal bacteremia and septic shock due to left septic shoulder sp I and D.  His right shoulder  Was also infected. He then grew Pseudomonas growing from an aspirate.   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  He is now status post surgery to the right side and repeat aspirate of the left which revealed hematoma.  He has been on cefepime after the Pseudomonas was recovered.  He has been on antibiotics since November 24 when he was admitted with sepsis.  He was sent home on cefepime.  In the interim he was seen by Dr. Onnie Graham who was concerned about his appearance and felt he was not well enough to go home and sent him to the emergency department.  In the interim he had aspirated both shoulders and sent the patient with the shoulder aspirate sent to the emergency department.  Aspirate the 1 shoulder revealed 38,200 white blood cells with 97% PMNs aspirate the other shoulder revealed 4 51,000 white blood cells with 99% neutrophils.  There were no crystals Gram stain was negative and the cultures were negative--KEEP IN MIND he has been on ACTIVE antbiotics for weeks.  Dr. Stann Mainland was called about the patient when he was in the ER and reviewed his laboratory findings and felt that there was no orthopedic intervention but that the patient might need further medical optimization in the hospital.  Ultimately patient was discharged him home from the ER.  I was concerned that he in fact DOES need further intervention on both shoulders.  He presented to clinic for follow-up in my clinic and his renal function had worsened considerably having had his creatinine jumped from 1 to above 2.  I arrange for direct admission to the hospital  but because the hospital was so full he was not admitted for an additional 2 days.  When he was admitted he was found to have much worsening renal failure with a creatinine to up to 4.  During his admission he required hemodialysis due to him having encephalopathy and then also hypoxemia from fluid overload but eventually this was stopped and he was felt not to need it anymore.  He was evaluated by orthopedic surgery and Dr. Onnie Graham not feel there was a need for further surgical intervention.   My partner Dr. Linus Salmons saw him as well and we continued him on IV cefepime.  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  Ultimately was discharged home roughly 4 days ago.  Note he was initially to be discharged to a skilled nursing facility but refused to go there.  Ultimately he was discharged back to home  [place] woman has hired who is administering his antibiotics to him at home and who accompanied him to the visit.  She only speaks Spanish but uses a translation app when talking to him or to [place].  He had multiple complaints today.  He says he has had blurry vision constantly ever since he was readmitted to the hospital.  So says that he is feeling very weak it is very hard for him to stand without risk of falling.  His  aide told me that he makes almost no urine.    I have labs from his home health agency which have shown that his creatinine is 3.25 and stable from when he was discharged in the hospital metabolic panel was otherwise relatively unremarkable with a normal sodium of 134 normal potassium of 3.5.  Bicarbonate was low at 18 CBC with differential had a normal white count of 6.9 hemoglobin 8.4 platelets of 326.  His sed rate was high at 57 and CRP high at 36.    Patient also is complaining of an area on his buttocks that is become quite sore and that he developed while he was in the hospital.  He asked for  attention to this area.  He continues to still have his central line in and is due to finish his antibiotics on the 28th of this month.  His range of motion in his shoulders remains poor and he still somewhat tender on both sides.      Past Medical History:  Diagnosis Date  . Acute renal failure (ARF) ([place]) 08/21/2020  . Anemia    NOS  . Anxiety   . 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 BPH (benign prostatic hypertrophy)   . Broken foot Oct. 12, 2013   Right foot Fx  . CAD (coronary artery disease)   . Carotid stenosis, left   . Cellulitis and abscess of leg 03/2018   right leg  . Chronic hyponatremia   . Chronic pain syndrome   . COPD (chronic obstructive pulmonary disease) ([place])   . Depression   . Diabetes mellitus   . Diabetic foot ulcer ([place])   . DJD (degenerative joint disease)    L wrist  . DM2 (diabetes mellitus, type 2) ([place])   . GERD (gastroesophageal reflux disease)   . HLD (hyperlipidemia)   . HTN (hypertension)   . Hx of blood transfusion reaction   . Hyponatremia   . Lumbar disc disease   . MI (mitral incompetence)   . Myocardial infarction ([place])    x 2  . Osteoporosis    tx per [facility], Dr Prudencio Burly, thought due to heavy steriod use after 1978  . Peripheral neuropathy   . Peripheral vascular disease ([place])   . Restless leg syndrome   . Spine fracture    hx, multiple  . Varicose veins   . 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 Vitamin D deficiency     Past Surgical History:  Procedure Laterality Date  . ANGIOPLASTY    . aorta bifemoral bypass grafting  09/2010  . CARDIAC CATHETERIZATION N/A 09/19/2015   Procedure: Left Heart Cath and Coronary Angiography;  Surgeon: Adrian Prows, MD;  Location: [facility];  Service: Cardiovascular;  Laterality: N/A;  . CARPAL TUNNEL RELEASE     right 2006/ left 2007  . CATARACT EXTRACTION     left 1996/ right 1997  . CORONARY ARTERY BYPASS GRAFT N/A 09/22/2015   Procedure:  CORONARY ARTERY BYPASS GRAFTING (CABG) times four using the right greater saphenous vein harvested endoscopically and the left internal mammary artery.  LIMA-LAD, SEQ SVG-DIAG & OM, SVG-PD.;  Surgeon: [name], MD;  Location: [facility];  Service: Open Heart Surgery;  Laterality: N/A;  . I & D EXTREMITY Right 04/07/2018   Procedure: IRRIGATION AND DEBRIDEMENT ABSCESS RIGHT LEG;  Surgeon: Newt Minion, MD;  Location: [facility];  Service: Orthopedics;  Laterality:  Right;  Marland Kitchen INCISION AND DRAINAGE OF WOUND Right 05/07/2020   Procedure: RIGHT LONG FINGER IRRIGATION AND DEBRIDEMENT AND AMPUTATION;  Surgeon: Iran Planas, MD;  Location: [facility];  Service: Orthopedics;  Laterality: Right;  with IV sedation  . INCISION AND DRAINAGE OF WOUND Right 05/12/2020   Procedure: IRRIGATION AND DEBRIDEMENT WOUND and possible revision amputation;  Surgeon: Iran Planas, MD;  Location: [facility];  Service: Orthopedics;  Laterality: Right;  needs 60 minutes  . IR FLUORO GUIDE CV LINE LEFT  08/25/2020  . IR FLUORO GUIDE CV LINE RIGHT  07/18/2020  . IR US GUIDE VASC ACCESS LEFT  08/25/2020  . IR US GUIDE VASC ACCESS RIGHT  07/18/2020  . L foot open repair jones fracture  2010    5th metetarsal   . left ankle ganglion cyst  1976  . left carotid endarterectomy  03/2011  . left cataract  1996   right - 1997  . left CTS  2006   R CTS - 2007  . left foot surgery  1998   R surgery/fracture - 1999  . left plantar ganglion cystectomy  1979  . left wrist/hand fusion  2008  . [place], 2004  . NASAL SINUS SURGERY     multiple- x8. last 1997 with obliteration  . REPAIR THORACIC AORTA  2017  . right hand fracture  1969  . ROTATOR CUFF REPAIR  2006   R, than repeat 2011, Dr Theda Sers  . SHOULDER ARTHROSCOPY Bilateral 07/17/2020   Procedure: Right shoulder arthroscopic lavage; Left shoulder aspiration.;  Surgeon: Justice Britain, MD;  Location: [facility];  Service: Orthopedics;  Laterality: Bilateral;  149mn  . SHOULDER  ARTHROSCOPY WITH LABRAL REPAIR Left 07/10/2020   Procedure: SHOULDER ARTHROSCOPY WITH WASHOUT;  Surgeon: SJustice Britain MD;  [facility];  Service: Orthopedics;  Laterality: Left;  . TRIGGER FINGER RELEASE Left 11/11/2014   Procedure: LEFT LONG FINGER RELEASE TRIGGER FINGER/A-1 PULLEY;  Surgeon: DMilly Jakob MD;  [facility] [facility]  Service: Orthopedics;  Laterality: Left;  . vocal surgery  1996    Family History  Problem Relation Age of Onset  . Lung cancer Father 542 . Diabetes Mother 73 . Osteoporosis Mother   . Depression Mother   . Bleeding Disorder Mother   . Throat cancer Other        family hx - "bad living" - also lung CA, heart disease and renal failure    . Diabetes Sister   . Suicidality Son 488 . Lung cancer Maternal Grandmother 567 . Diabetes Maternal Grandfather 669 . Heart disease Maternal Grandfather   . Heart attack Paternal Grandmother   . Brain cancer Paternal Grandfather 793     Social History   Socioeconomic History  . Marital status: Divorced    Spouse name: Not on file  . Number of children: Not on file  . Years of education: Not on file  . Highest education level: Not on file  Occupational History  . Not on file  Tobacco Use  . Smoking status: Former Smoker    Packs/day: 1.50    Years: 30.00    Pack years: 45.00    Types: Cigarettes    Quit date: 11/06/1988    Years since quitting: 31.8  . Smokeless tobacco: Never Used  Vaping Use  . Vaping Use: Never used  Substance and Sexual Activity  . Alcohol use: Not Currently    Comment: None in 30  years but drank beer before- 05/09/20  . Drug use: No  . Sexual activity: Not Currently  Other Topics Concern  . Not on file  Social History Narrative   Social History      Diet? I eat about what I want.       Do you drink/eat things with caffeine? yes      Marital status?                  divorced                  What year were you married? 1966 to 2007      Do you live  in a house, apartment, assisted living, condo, trailer, etc.? [place]      Is it one or more stories? one      How many persons live in your home? Me only      Do you have any pets in your home? (please list) no      Highest level of education completed? Finished high school      Current or past profession: Clinical biochemist in [place] you exercise?               no                       Type & how often?      Advanced Directives      Do you have a living will? no      Do you have a DNR form?     no                             If not, do you want to discuss one?      Do you have signed POA/HPOA for forms? No      Functional Status      Do you have difficulty bathing or dressing yourself? No- shower      Do you have difficulty preparing food or eating? No- Don't want to      Do you have difficulty managing your medications?      Do you have difficulty managing your finances?      Do you have difficulty affording your medications? No- paid for by [place] Strain: Not on [employer] Insecurity: Not on file  Transportation Needs: Not on file  Physical Activity: Not on file  Stress: Not on file  Social Connections: Not on file    Allergies  Allergen Reactions  . Ivp Dye [Iodinated Diagnostic Agents]     Blood Pressure dropped----pt was pre-medicated with 13 hour prep and did fine with pre-meds--amy 03/09/11   . Sulfa Antibiotics Hives  . Sulfonamide Derivatives Hives and Itching  . Lisinopril Cough  . Metoprolol Nausea And Vomiting  . Statins Other (See Comments)    myalgias  . Propoxyphene N-Acetaminophen Other (See Comments)    Sharp pains- headache     Current Outpatient Medications:  .  ACCU-CHEK FASTCLIX LANCETS MISC, Use as directed to check blood glucose. Dx: E11.51, Disp: 102 each, Rfl: 6 .  acetaminophen (TYLENOL) 500 MG tablet, Take 1,000 mg by mouth every 6 (six) hours as needed for  mild pain, fever or headache., Disp: , Rfl:  .  albuterol (VENTOLIN HFA) 108 (90 Base) MCG/ACT  inhaler, Inhale 2 puffs into the lungs every 6 (six) hours as needed for wheezing or shortness of breath., Disp: , Rfl:  .  ARIPiprazole (ABILIFY) 5 MG tablet, Take 1 tablet (5 mg total) by mouth daily., Disp: , Rfl:  .  aspirin EC 81 MG tablet, Take 1 tablet (81 mg total) by mouth daily., Disp: 30 tablet, Rfl: 0 .  Blood Glucose Monitoring Suppl (ACCU-CHEK AVIVA PLUS) w/Device KIT, Use as directed to check blood glucose daily. Dx: E11.51, Disp: 1 kit, Rfl: 0 .  BRILINTA 90 MG TABS tablet, Take 90 mg by mouth 2 (two) times daily., Disp: , Rfl:  .  buPROPion (WELLBUTRIN XL) 300 MG 24 hr tablet, Take 1 tablet (300 mg total) by mouth daily., Disp: 90 tablet, Rfl: 0 .  carvedilol (COREG) 12.5 MG tablet, Take 1 tablet (12.5 mg total) by mouth 2 (two) times daily with a meal., Disp: , Rfl:  .  ceFEPime (MAXIPIME) IVPB, Inject 2 g into the vein daily for 5 days. Indication:  Septic arthritis  First Dose: No Last Day of Therapy:  09/12/2020 Labs - Once weekly:  CBC/D and BMP, Labs - Every other week:  ESR and CRP Method of administration: IV Push Method of administration may be changed at the discretion of home infusion pharmacist based upon assessment of the patient and/or caregiver's ability to self-administer the medication ordered., Disp: 6 Units, Rfl: 0 .  Cholecalciferol (VITAMIN D3) 5000 units TABS, Take 5,000 Units by mouth daily., Disp: , Rfl:  .  clonazePAM (KLONOPIN) 0.5 MG tablet, Take 1 tablet (0.5 mg total) by mouth 2 (two) times daily as needed for up to 7 days for anxiety., Disp: 14 tablet, Rfl: 0 .  gabapentin (NEURONTIN) 100 MG capsule, Take 1 capsule (100 mg total) by mouth 2 (two) times daily., Disp: , Rfl:  .  glucose blood (ACCU-CHEK AVIVA PLUS) test strip, 1 each by Other route as needed for other. Use as instructed. Dx: E11.51, Disp: 100 each, Rfl: 6 .  insulin aspart (NOVOLOG) 100 UNIT/ML  injection, Inject 0-9 Units into the skin 3 (three) times daily with meals., Disp: 10 mL, Rfl: 11 .  IRON PO, Take 1 capsule by mouth daily., Disp: , Rfl:  .  isosorbide mononitrate (IMDUR) 60 MG 24 hr tablet, Take 60 mg by mouth daily., Disp: , Rfl:  .  Multiple Vitamin (MULTIVITAMIN WITH MINERALS) TABS tablet, Take 1 tablet by mouth daily., Disp: , Rfl:  .  NARCAN 4 MG/0.1ML LIQD nasal spray kit, Place 4 mg into the nose as needed (opioid reversal)., Disp: , Rfl:  .  nitroGLYCERIN (NITROSTAT) 0.4 MG SL tablet, PLACE 1 TABLET UNDER THE TONGUE EVERY 5 MINUTES AS NEEDED FOR CHEST PAIN (Patient taking differently: Place 0.4 mg under the tongue every 5 (five) minutes as needed for chest pain.), Disp: 25 tablet, Rfl: 2 .  pantoprazole (PROTONIX) 40 MG tablet, Take 1 tablet (40 mg total) by mouth 2 (two) times daily., Disp: , Rfl:  .  rOPINIRole (REQUIP) 1 MG tablet, Take 1 tablet (1 mg total) by mouth in the morning and at bedtime., Disp: 180 tablet, Rfl: 0 .  tamsulosin (FLOMAX) 0.4 MG CAPS capsule, Take 1 capsule (0.4 mg total) by mouth 2 (two) times daily., Disp: 180 capsule, Rfl: 0    Review of Systems  Constitutional: Positive for appetite change and fatigue. Negative for chills and fever.  HENT: Negative for congestion and sore throat.   Eyes: Positive for visual disturbance.  Negative for photophobia.  Respiratory: Negative for cough, shortness of breath and wheezing.   Cardiovascular: Negative for chest pain, palpitations and leg swelling.  Gastrointestinal: Negative for abdominal pain, blood in stool, constipation, diarrhea, nausea and vomiting.  Genitourinary: Positive for decreased urine volume.  Musculoskeletal: Positive for arthralgias and joint swelling. Negative for back pain and myalgias.  Skin: Negative for rash.  Neurological: Positive for weakness. Negative for headaches.  Hematological: Does not bruise/bleed easily.  Psychiatric/Behavioral: Positive for decreased concentration.  Negative for agitation, confusion, hallucinations and suicidal ideas.       Objective:   Physical Exam Constitutional:      General: He is not in acute distress.    Appearance: He is well-developed. He is ill-appearing. He is not diaphoretic.  HENT:     Head: Normocephalic and atraumatic.     Right Ear: Hearing and external ear normal.     Left Ear: Hearing and external ear normal.     Nose: No nasal deformity or rhinorrhea.  Eyes:     General: No scleral icterus.    Conjunctiva/sclera:     Right eye: Right conjunctiva is not injected.     Left eye: Left conjunctiva is not injected.     Comments: Blurry vision  Neck:     Vascular: No JVD.  Cardiovascular:     Rate and Rhythm: Regular rhythm. Bradycardia present.     Heart sounds: S1 normal and S2 normal. No murmur heard. No friction rub. No gallop.   Pulmonary:     Effort: Pulmonary effort is normal. No respiratory distress.     Breath sounds: Normal breath sounds. No rhonchi.  Abdominal:     General: Bowel sounds are normal. There is no distension.     Palpations: Abdomen is soft.  Musculoskeletal:     Right shoulder: Tenderness present. Decreased range of motion.     Left shoulder: Tenderness present. Decreased range of motion.     Cervical back: Neck supple.     Right hip: Normal.     Left hip: Normal.     Right knee: Normal.     Left knee: Normal.  Lymphadenopathy:     Head:     Right side of head: No submandibular, preauricular or posterior auricular adenopathy.     Left side of head: No submandibular, preauricular or posterior auricular adenopathy.     Cervical:     Right cervical: No superficial or deep cervical adenopathy.    Left cervical: No superficial or deep cervical adenopathy.  Skin:    General: Skin is warm and dry.     Coloration: Skin is not pale.     Findings: No abrasion, bruising, ecchymosis, erythema, lesion or rash.     Nails: There is no clubbing.  Neurological:     General: No focal  deficit present.     Mental Status: He is oriented to person, place, and time.     Sensory: No sensory deficit.     Coordination: Coordination normal.     Gait: Gait normal.  Psychiatric:        Attention and Perception: Attention normal. He is attentive.        Speech: Speech normal.        Behavior: Behavior normal. Behavior is cooperative.        Thought Content: Thought content normal.        Cognition and Memory: Memory is impaired.        Judgment: Judgment normal.  Left shoulder       Right shoulder        Central line September 10 2020        Decubitus ulcer which may have a bit of a candidal infection as well September 10, 2020:         Assessment & Plan:  Deconditioning with failure to thrive and low blood pressure continued poor renal function:  I felt that he really looks to need to be readmitted to the hospital and offered to try to facilitate a direct admission yet again.  He was however against doing this.  He wants to go back home even if that means he is at risk of dying.  His aide assured me that she would watch him closely.   Bilateral septic arthritis with Group B streptococcus and Pseudomonas (though latter only isolated on one aspirate)  I will have him finish his cefepime  Then I will start amoxicillin twice daily targeting the group B streptococcus but not the Pseudomonas.  Note I do not want to hazard a fluoroquinolone and then run into C. difficile colitis  Try to give him chronic oral therapy to keep his infection at [place], and I worry about having him off antibiotics and the risk of him becoming septic again from a nidus of infection such as osteomyelitis  Acute renal failure: All his creatinine appears normal by labs he is making very little urine and [place] expect he is going to progress with worsening renal function over time.  CHF: This again is something and not easy to manage in the outpatient world given  his Condition  Blurry vision: Certainly could be due to low blood pressure.  Again I would rather have him admitted.  Bedsore: I will give him some topical nystatin because I think there is a bit of a fungal infection here.  I spent greater than 40 minutes with the patient including greater than 50% of time in face to face counsel of the patient and his aide regarding his septic shoulders osteomyelitis his renal failure is congestive heart failure he has blurry vision his bedsore with Candida infection is overall deconditioning and weakness and in coordination of his care.  I certainly hope that he has good follow-up with nephrology palliative care and his primary care physician.  I am very worried about his overall prognosis.

## 2020-09-10 NOTE — Telephone Encounter (Signed)
[facility] lab review - patient's creatinine is stable within established range of known CKD.   Lab Results  Component Value Date   CREATININE 3.27 (H) 09/05/2020   CREATININE 3.37 (H) 09/04/2020   CREATININE 3.37 (H) 09/03/2020   Dose of cefepime 2 gm IV q24h remains appropriate    Janene Madeira, MSN, NP-C [facility] [email or web address] Pager: [phone] Office: [phone] [place]: [phone]

## 2020-09-10 NOTE — Progress Notes (Signed)
Subjective:  Chief complaint: Bilateral shoulder pain  Patient ID: Jordin Vicencio, male    DOB: 1946-06-24, 75 y.o.   MRN: [id number]  HPI   75 y.o. male with Group B streptococcal bacteremia and septic shock due to left septic shoulder sp I and D.  His right shoulder  Was also infected. He then grew Pseudomonas growing from an aspirate.   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  He is now status post surgery to the right side and repeat aspirate of the left which revealed hematoma.  He has been on cefepime after the Pseudomonas was recovered.  He has been on antibiotics since November 24 when he was admitted with sepsis.  He was sent home on cefepime.  In the interim he was seen by Dr. Onnie Graham who is concerned about his appearance and felt he was not well enough to go home and sent him to the emergency department.  In the interim he had aspirated both shoulders and sent the patient with the shoulder aspirate sent to the emergency department.  Aspirate the 1 shoulder revealed 38,200 white blood cells with 97% PMNs aspirate the other shoulder revealed 4 51,000 white blood cells with 99% neutrophils.  There were no crystals Gram stain was negative and the cultures were negative--KEEP IN MIND he has been on ACTIVE antbiotics for weeks.  Dr. Stann Mainland was called about the patient when he was in the ER and reviewed his laboratory findings and felt that there was no orthopedic intervention but that the patient might need further medical optimization in the hospital.  Ultimately patient was discharged him home from the ER.  I am concerned that he in fact DOES need further intervention on both shoulders.  He presented to clinic for follow-up today.  Early his morning we were called [facility] who informed us his kidney function has worsened with his creatinine going from 1.2-2 2.13.  They have dose adjusted his  cefepime.  He currently has hired a [place] woman who is administering his antibiotics to him at home and who accompanied him to the visit.  She only speaks Spanish but uses a translation app when talking to him or to [place].  The patient himself still has pain bilaterally he says that his left side had some intense erythema streaking down it abruptly a week or 2 ago.  His range of motion is severely limited bilaterally he does have tenderness there is not exquisite in both shoulders.     Past Medical History:  Diagnosis Date  . Acute renal failure (ARF) ([place]) 08/21/2020  . Anemia    NOS  . Anxiety   . 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 BPH (benign prostatic hypertrophy)   . Broken foot Oct. 12, 2013   Right foot Fx  . CAD (coronary artery disease)   . Carotid stenosis, left   . Cellulitis and abscess of leg 03/2018   right leg  . Chronic hyponatremia   . Chronic pain syndrome   . COPD (chronic obstructive pulmonary disease) ([place])   . Depression   . Diabetes mellitus   . Diabetic foot ulcer ([place])   . DJD (degenerative joint disease)    L wrist  . DM2 (diabetes mellitus, type 2) ([place])   . GERD (gastroesophageal reflux disease)   . HLD (hyperlipidemia)   . HTN (hypertension)   .  Hx of blood transfusion reaction   . Hyponatremia   . Lumbar disc disease   . MI (mitral incompetence)   . Myocardial infarction ([place])    x 2  . Osteoporosis    tx per [facility], Dr Prudencio Burly, thought due to heavy steriod use after 1978  . Peripheral neuropathy   . Peripheral vascular disease ([place])   . Restless leg syndrome   . Spine fracture    hx, multiple  . Varicose veins   . 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 Vitamin D deficiency     Past Surgical History:  Procedure Laterality Date  . ANGIOPLASTY    . aorta bifemoral bypass grafting  09/2010  . CARDIAC CATHETERIZATION N/A 09/19/2015   Procedure: Left Heart Cath and Coronary Angiography;  Surgeon:  Adrian Prows, MD;  Location: [facility];  Service: Cardiovascular;  Laterality: N/A;  . CARPAL TUNNEL RELEASE     right 2006/ left 2007  . CATARACT EXTRACTION     left 1996/ right 1997  . CORONARY ARTERY BYPASS GRAFT N/A 09/22/2015   Procedure: CORONARY ARTERY BYPASS GRAFTING (CABG) times four using the right greater saphenous vein harvested endoscopically and the left internal mammary artery.  LIMA-LAD, SEQ SVG-DIAG & OM, SVG-PD.;  Surgeon: [name], MD;  Location: [facility];  Service: Open Heart Surgery;  Laterality: N/A;  . I & D EXTREMITY Right 04/07/2018   Procedure: IRRIGATION AND DEBRIDEMENT ABSCESS RIGHT LEG;  Surgeon: Newt Minion, MD;  Location: [facility];  Service: Orthopedics;  Laterality: Right;  . INCISION AND DRAINAGE OF WOUND Right 05/07/2020   Procedure: RIGHT LONG FINGER IRRIGATION AND DEBRIDEMENT AND AMPUTATION;  Surgeon: Iran Planas, MD;  Location: [facility];  Service: Orthopedics;  Laterality: Right;  with IV sedation  . INCISION AND DRAINAGE OF WOUND Right 05/12/2020   Procedure: IRRIGATION AND DEBRIDEMENT WOUND and possible revision amputation;  Surgeon: Iran Planas, MD;  Location: [facility];  Service: Orthopedics;  Laterality: Right;  needs 60 minutes  . IR FLUORO GUIDE CV LINE LEFT  08/25/2020  . IR FLUORO GUIDE CV LINE RIGHT  07/18/2020  . IR US GUIDE VASC ACCESS LEFT  08/25/2020  . IR US GUIDE VASC ACCESS RIGHT  07/18/2020  . L foot open repair jones fracture  2010    5th metetarsal   . left ankle ganglion cyst  1976  . left carotid endarterectomy  03/2011  . left cataract  1996   right - 1997  . left CTS  2006   R CTS - 2007  . left foot surgery  1998   R surgery/fracture - 1999  . left plantar ganglion cystectomy  1979  . left wrist/hand fusion  2008  . [place], 2004  . NASAL SINUS SURGERY     multiple- x8. last 1997 with obliteration  . REPAIR THORACIC AORTA  2017  . right hand fracture  1969  . ROTATOR CUFF REPAIR  2006   R, than repeat  2011, Dr Theda Sers  . SHOULDER ARTHROSCOPY Bilateral 07/17/2020   Procedure: Right shoulder arthroscopic lavage; Left shoulder aspiration.;  Surgeon: Justice Britain, MD;  Location: [facility];  Service: Orthopedics;  Laterality: Bilateral;  122mn  . SHOULDER ARTHROSCOPY WITH LABRAL REPAIR Left 07/10/2020   Procedure: SHOULDER ARTHROSCOPY WITH WASHOUT;  Surgeon: SJustice Britain MD;  Location[facility];  Service: Orthopedics;  Laterality: Left;  . TRIGGER FINGER RELEASE Left 11/11/2014   Procedure: LEFT  LONG FINGER RELEASE TRIGGER FINGER/A-1 PULLEY;  Surgeon: Milly Jakob, MD;  Location: [facility];  Service: Orthopedics;  Laterality: Left;  . vocal surgery  1996    Family History  Problem Relation Age of Onset  . Lung cancer Father 19  . Diabetes Mother 72  . Osteoporosis Mother   . Depression Mother   . Bleeding Disorder Mother   . Throat cancer Other        family hx - "bad living" - also lung CA, heart disease and renal failure    . Diabetes Sister   . Suicidality Son 55  . Lung cancer Maternal Grandmother 49  . Diabetes Maternal Grandfather 29  . Heart disease Maternal Grandfather   . Heart attack Paternal Grandmother   . Brain cancer Paternal Grandfather 58      Social History   Socioeconomic History  . Marital status: Divorced    Spouse name: Not on file  . Number of children: Not on file  . Years of education: Not on file  . Highest education level: Not on file  Occupational History  . Not on file  Tobacco Use  . Smoking status: Former Smoker    Packs/day: 1.50    Years: 30.00    Pack years: 45.00    Types: Cigarettes    Quit date: 11/06/1988    Years since quitting: 31.8  . Smokeless tobacco: Never Used  Vaping Use  . Vaping Use: Never used  Substance and Sexual Activity  . Alcohol use: Not Currently    Comment: None in 30 years but drank beer before- 05/09/20  . Drug use: No  . Sexual activity: Not Currently  Other Topics Concern  . Not on file   Social History Narrative   Social History      Diet? I eat about what I want.       Do you drink/eat things with caffeine? yes      Marital status?                  divorced                  What year were you married? 1966 to 2007      Do you live in a house, apartment, assisted living, condo, trailer, etc.? [place]      Is it one or more stories? one      How many persons live in your home? Me only      Do you have any pets in your home? (please list) no      Highest level of education completed? Finished high school      Current or past profession: Clinical biochemist in [place] you exercise?               no                       Type & how often?      Advanced Directives      Do you have a living will? no      Do you have a DNR form?     no                             If not, do you want to discuss one?      Do you have signed POA/HPOA for forms?  No      Functional Status      Do you have difficulty bathing or dressing yourself? No- shower      Do you have difficulty preparing food or eating? No- Don't want to      Do you have difficulty managing your medications?      Do you have difficulty managing your finances?      Do you have difficulty affording your medications? No- paid for by [place] Strain: Not on [employer] Insecurity: Not on file  Transportation Needs: Not on file  Physical Activity: Not on file  Stress: Not on file  Social Connections: Not on file    Allergies  Allergen Reactions  . Ivp Dye [Iodinated Diagnostic Agents]     Blood Pressure dropped----pt was pre-medicated with 13 hour prep and did fine with pre-meds--amy 03/09/11   . Sulfa Antibiotics Hives  . Sulfonamide Derivatives Hives and Itching  . Lisinopril Cough  . Metoprolol Nausea And Vomiting  . Statins Other (See Comments)    myalgias  . Propoxyphene N-Acetaminophen Other (See Comments)    Sharp pains-  headache     Current Outpatient Medications:  .  ACCU-CHEK FASTCLIX LANCETS MISC, Use as directed to check blood glucose. Dx: E11.51, Disp: 102 each, Rfl: 6 .  acetaminophen (TYLENOL) 500 MG tablet, Take 1,000 mg by mouth every 6 (six) hours as needed for mild pain, fever or headache., Disp: , Rfl:  .  albuterol (VENTOLIN HFA) 108 (90 Base) MCG/ACT inhaler, Inhale 2 puffs into the lungs every 6 (six) hours as needed for wheezing or shortness of breath., Disp: , Rfl:  .  ARIPiprazole (ABILIFY) 5 MG tablet, Take 1 tablet (5 mg total) by mouth daily., Disp: , Rfl:  .  aspirin EC 81 MG tablet, Take 1 tablet (81 mg total) by mouth daily., Disp: 30 tablet, Rfl: 0 .  Blood Glucose Monitoring Suppl (ACCU-CHEK AVIVA PLUS) w/Device KIT, Use as directed to check blood glucose daily. Dx: E11.51, Disp: 1 kit, Rfl: 0 .  BRILINTA 90 MG TABS tablet, Take 90 mg by mouth 2 (two) times daily., Disp: , Rfl:  .  buPROPion (WELLBUTRIN XL) 300 MG 24 hr tablet, Take 1 tablet (300 mg total) by mouth daily., Disp: 90 tablet, Rfl: 0 .  carvedilol (COREG) 12.5 MG tablet, Take 1 tablet (12.5 mg total) by mouth 2 (two) times daily with a meal., Disp: , Rfl:  .  ceFEPime (MAXIPIME) IVPB, Inject 2 g into the vein daily for 5 days. Indication:  Septic arthritis  First Dose: No Last Day of Therapy:  09/12/2020 Labs - Once weekly:  CBC/D and BMP, Labs - Every other week:  ESR and CRP Method of administration: IV Push Method of administration may be changed at the discretion of home infusion pharmacist based upon assessment of the patient and/or caregiver's ability to self-administer the medication ordered., Disp: 6 Units, Rfl: 0 .  Cholecalciferol (VITAMIN D3) 5000 units TABS, Take 5,000 Units by mouth daily., Disp: , Rfl:  .  clonazePAM (KLONOPIN) 0.5 MG tablet, Take 1 tablet (0.5 mg total) by mouth 2 (two) times daily as needed for up to 7 days for anxiety., Disp: 14 tablet, Rfl: 0 .  gabapentin (NEURONTIN) 100 MG capsule, Take 1  capsule (100 mg total) by mouth 2 (two) times daily., Disp: , Rfl:  .  glucose blood (ACCU-CHEK AVIVA PLUS) test strip, 1 each  by Other route as needed for other. Use as instructed. Dx: E11.51, Disp: 100 each, Rfl: 6 .  insulin aspart (NOVOLOG) 100 UNIT/ML injection, Inject 0-9 Units into the skin 3 (three) times daily with meals., Disp: 10 mL, Rfl: 11 .  IRON PO, Take 1 capsule by mouth daily., Disp: , Rfl:  .  isosorbide mononitrate (IMDUR) 60 MG 24 hr tablet, Take 60 mg by mouth daily., Disp: , Rfl:  .  Multiple Vitamin (MULTIVITAMIN WITH MINERALS) TABS tablet, Take 1 tablet by mouth daily., Disp: , Rfl:  .  NARCAN 4 MG/0.1ML LIQD nasal spray kit, Place 4 mg into the nose as needed (opioid reversal)., Disp: , Rfl:  .  nitroGLYCERIN (NITROSTAT) 0.4 MG SL tablet, PLACE 1 TABLET UNDER THE TONGUE EVERY 5 MINUTES AS NEEDED FOR CHEST PAIN (Patient taking differently: Place 0.4 mg under the tongue every 5 (five) minutes as needed for chest pain.), Disp: 25 tablet, Rfl: 2 .  pantoprazole (PROTONIX) 40 MG tablet, Take 1 tablet (40 mg total) by mouth 2 (two) times daily., Disp: , Rfl:  .  rOPINIRole (REQUIP) 1 MG tablet, Take 1 tablet (1 mg total) by mouth in the morning and at bedtime., Disp: 180 tablet, Rfl: 0 .  tamsulosin (FLOMAX) 0.4 MG CAPS capsule, Take 1 capsule (0.4 mg total) by mouth 2 (two) times daily., Disp: 180 capsule, Rfl: 0    Review of Systems  Constitutional: Negative for chills and fever.  HENT: Negative for congestion and sore throat.   Eyes: Negative for photophobia.  Respiratory: Negative for cough, shortness of breath and wheezing.   Cardiovascular: Negative for chest pain, palpitations and leg swelling.  Gastrointestinal: Negative for abdominal pain, blood in stool, constipation, diarrhea, nausea and vomiting.  Genitourinary: Negative for dysuria, flank pain and hematuria.  Musculoskeletal: Positive for arthralgias and joint swelling. Negative for back pain and myalgias.   Skin: Negative for rash.  Neurological: Negative for dizziness, weakness and headaches.  Hematological: Does not bruise/bleed easily.  Psychiatric/Behavioral: Negative for agitation, confusion, decreased concentration, hallucinations and suicidal ideas.       Objective:   Physical Exam Constitutional:      General: He is not in acute distress.    Appearance: Normal appearance. He is well-developed. He is not ill-appearing or diaphoretic.  HENT:     Head: Normocephalic and atraumatic.     Right Ear: Hearing and external ear normal.     Left Ear: Hearing and external ear normal.     Nose: No nasal deformity or rhinorrhea.  Eyes:     General: No scleral icterus.    Conjunctiva/sclera: Conjunctivae normal.     Right eye: Right conjunctiva is not injected.     Left eye: Left conjunctiva is not injected.  Neck:     Vascular: No JVD.  Cardiovascular:     Rate and Rhythm: Normal rate and regular rhythm.     Heart sounds: S1 normal and S2 normal.  Abdominal:     General: Bowel sounds are normal. There is no distension.     Palpations: Abdomen is soft.     Tenderness: There is no abdominal tenderness.  Musculoskeletal:     Right shoulder: Tenderness present. Decreased range of motion.     Left shoulder: Tenderness present. Decreased range of motion.     Cervical back: Normal range of motion and neck supple.     Right hip: Normal.     Left hip: Normal.     Right knee: Normal.  Left knee: Normal.  Lymphadenopathy:     Head:     Right side of head: No submandibular, preauricular or posterior auricular adenopathy.     Left side of head: No submandibular, preauricular or posterior auricular adenopathy.     Cervical: No cervical adenopathy.     Right cervical: No superficial or deep cervical adenopathy.    Left cervical: No superficial or deep cervical adenopathy.  Skin:    General: Skin is warm and dry.     Coloration: Skin is not pale.     Findings: No abrasion, bruising,  ecchymosis, erythema, lesion or rash.     Nails: There is no clubbing.  Neurological:     General: No focal deficit present.     Mental Status: He is alert and oriented to person, place, and time.     Sensory: No sensory deficit.     Coordination: Coordination normal.     Gait: Gait normal.  Psychiatric:        Attention and Perception: Attention normal. He is attentive.        Speech: Speech normal.        Behavior: Behavior normal. Behavior is cooperative.        Thought Content: Thought content normal.        Cognition and Memory: Memory is impaired.        Judgment: Judgment normal.     Left shoulder       Right shoulder        Central line          Assessment & Plan:  Bilateral septic arthritis with Group B streptococcus and Pseudomonas (though latter only isolated on one aspirate)  I think he clearly needs to be admitted to the hospital and seen formally by Orthopedics  I have called Dr. Lorin Mercy who has agreed to have the patient admitted to the hospital  I have also discussed the case with Dr. Onnie Graham over the phone.  He agreed that they would see the patient when the patient is admitted though he was skeptical that he had many surgical options to offer the patient.  I certainly think at minimum the patient's shoulder should both be imaged with MRIs to give [place] more information about what is going on he had documented osteomyelitis on MRI of the right shoulder previously and perhaps some of his pathology is due to progression here.  Inflammatory markers also remain high with his sed rate at 70 and CRP at 15.  Fact that he has developed some renal failure makes me concerned that he is developed being some systemic symptoms as well.  Acute renal failure: Likely due to prerenal failure have asked him to stop his Lasix and his cefepime dose is being adjusted.  He reminded me that he has congestive heart failure and I agreed that this is another complication of  trying to balance his fluid status and the need again for him to be admitted to the hospital  CHF: Again I think he needs to hold the Lasix in the context of his acute renal failure.  I spent greater than 40 minutes with the patient including greater than 50% of time in face to face counsel of the patient and his caregiver regarding his infection, and in coordination of his care with the hospitalist service and orthopedic surgeon

## 2020-09-11 ENCOUNTER — Telehealth: Payer: Self-pay

## 2020-09-11 NOTE — Telephone Encounter (Signed)
Patient will do last dose of IV antibiotics on 09/12/20 and can have tunneled CVC removed after last dose per Dr. [name]. I have contacted Anderson Malta with [facility] and scheduled patient to have tunneled CVC removed on 09/16/20 and arrive at 11:30 am. I have also advised patient of appointment location and time. Patient verbalized understanding of appointment information. I have also confirmed with Debbie with [facility] that patient's last dose of IV antibiotics is on 09/12/20. I also also advised Debbie of patient's scheduled day to have tunneled CVC removed. Shabazz Mckey T Brooks Sailors

## 2020-09-15 ENCOUNTER — Ambulatory Visit (HOSPITAL_COMMUNITY): Payer: Medicare Other

## 2020-09-16 ENCOUNTER — Ambulatory Visit (HOSPITAL_COMMUNITY): Admission: RE | Admit: 2020-09-16 | Payer: Medicare Other | Source: Ambulatory Visit

## 2020-09-17 ENCOUNTER — Telehealth: Payer: Self-pay

## 2020-09-17 ENCOUNTER — Encounter (HOSPITAL_COMMUNITY): Payer: Self-pay

## 2020-09-17 ENCOUNTER — Emergency Department (HOSPITAL_COMMUNITY): Payer: Medicare Other

## 2020-09-17 ENCOUNTER — Observation Stay (HOSPITAL_COMMUNITY)
Admission: EM | Admit: 2020-09-17 | Discharge: 2020-09-17 | Disposition: A | Discharge status: Home / Self Care | Payer: Medicare Other | Attending: Family Medicine | Admitting: Family Medicine

## 2020-09-17 ENCOUNTER — Other Ambulatory Visit: Payer: Self-pay

## 2020-09-17 DIAGNOSIS — E1140 Type 2 diabetes mellitus with diabetic neuropathy, unspecified: Secondary | ICD-10-CM | POA: Insufficient documentation

## 2020-09-17 DIAGNOSIS — I5032 Chronic diastolic (congestive) heart failure: Secondary | ICD-10-CM

## 2020-09-17 DIAGNOSIS — E1149 Type 2 diabetes mellitus with other diabetic neurological complication: Secondary | ICD-10-CM

## 2020-09-17 DIAGNOSIS — I5022 Chronic systolic (congestive) heart failure: Secondary | ICD-10-CM | POA: Diagnosis not present

## 2020-09-17 DIAGNOSIS — J810 Acute pulmonary edema: Secondary | ICD-10-CM | POA: Diagnosis not present

## 2020-09-17 DIAGNOSIS — R0602 Shortness of breath: Secondary | ICD-10-CM | POA: Insufficient documentation

## 2020-09-17 DIAGNOSIS — R531 Weakness: Secondary | ICD-10-CM | POA: Diagnosis not present

## 2020-09-17 DIAGNOSIS — E1142 Type 2 diabetes mellitus with diabetic polyneuropathy: Secondary | ICD-10-CM

## 2020-09-17 DIAGNOSIS — N179 Acute kidney failure, unspecified: Secondary | ICD-10-CM | POA: Diagnosis present

## 2020-09-17 DIAGNOSIS — R2243 Localized swelling, mass and lump, lower limb, bilateral: Secondary | ICD-10-CM | POA: Insufficient documentation

## 2020-09-17 DIAGNOSIS — Z951 Presence of aortocoronary bypass graft: Secondary | ICD-10-CM | POA: Insufficient documentation

## 2020-09-17 DIAGNOSIS — Z794 Long term (current) use of insulin: Secondary | ICD-10-CM

## 2020-09-17 DIAGNOSIS — I10 Essential (primary) hypertension: Secondary | ICD-10-CM | POA: Diagnosis present

## 2020-09-17 DIAGNOSIS — I2510 Atherosclerotic heart disease of native coronary artery without angina pectoris: Secondary | ICD-10-CM | POA: Insufficient documentation

## 2020-09-17 DIAGNOSIS — Z7982 Long term (current) use of aspirin: Secondary | ICD-10-CM | POA: Diagnosis not present

## 2020-09-17 DIAGNOSIS — Z20822 Contact with and (suspected) exposure to covid-19: Secondary | ICD-10-CM | POA: Diagnosis not present

## 2020-09-17 DIAGNOSIS — I110 Hypertensive heart disease with heart failure: Secondary | ICD-10-CM | POA: Insufficient documentation

## 2020-09-17 DIAGNOSIS — J449 Chronic obstructive pulmonary disease, unspecified: Secondary | ICD-10-CM | POA: Diagnosis not present

## 2020-09-17 DIAGNOSIS — Z87891 Personal history of nicotine dependence: Secondary | ICD-10-CM | POA: Diagnosis not present

## 2020-09-17 DIAGNOSIS — I5030 Unspecified diastolic (congestive) heart failure: Secondary | ICD-10-CM

## 2020-09-17 DIAGNOSIS — Z79899 Other long term (current) drug therapy: Secondary | ICD-10-CM | POA: Diagnosis not present

## 2020-09-17 DIAGNOSIS — R079 Chest pain, unspecified: Secondary | ICD-10-CM | POA: Diagnosis present

## 2020-09-17 DIAGNOSIS — E1169 Type 2 diabetes mellitus with other specified complication: Secondary | ICD-10-CM | POA: Diagnosis present

## 2020-09-17 DIAGNOSIS — R0600 Dyspnea, unspecified: Secondary | ICD-10-CM

## 2020-09-17 DIAGNOSIS — E785 Hyperlipidemia, unspecified: Secondary | ICD-10-CM | POA: Diagnosis present

## 2020-09-17 HISTORY — DX: Heart failure, unspecified: I50.9

## 2020-09-17 LAB — CBC WITH DIFFERENTIAL/PLATELET
Abs Immature Granulocytes: 0.06 10*3/uL (ref 0.00–0.07)
Basophils Absolute: 0 10*3/uL (ref 0.0–0.1)
Basophils Relative: 1 %
Eosinophils Absolute: 0 10*3/uL (ref 0.0–0.5)
Eosinophils Relative: 0 %
HCT: 28.2 % — ABNORMAL LOW (ref 39.0–52.0)
Hemoglobin: 9.1 g/dL — ABNORMAL LOW (ref 13.0–17.0)
Immature Granulocytes: 1 %
Lymphocytes Relative: 15 %
Lymphs Abs: 1 10*3/uL (ref 0.7–4.0)
MCH: 30.4 pg (ref 26.0–34.0)
MCHC: 32.3 g/dL (ref 30.0–36.0)
MCV: 94.3 fL (ref 80.0–100.0)
Monocytes Absolute: 1 10*3/uL (ref 0.1–1.0)
Monocytes Relative: 15 %
Neutro Abs: 4.5 10*3/uL (ref 1.7–7.7)
Neutrophils Relative %: 68 %
Platelets: 303 10*3/uL (ref 150–400)
RBC: 2.99 MIL/uL — ABNORMAL LOW (ref 4.22–5.81)
RDW: 20.1 % — ABNORMAL HIGH (ref 11.5–15.5)
WBC: 6.6 10*3/uL (ref 4.0–10.5)
nRBC: 0 % (ref 0.0–0.2)

## 2020-09-17 LAB — COMPREHENSIVE METABOLIC PANEL
ALT: 18 U/L (ref 0–44)
AST: 21 U/L (ref 15–41)
Albumin: 2.5 g/dL — ABNORMAL LOW (ref 3.5–5.0)
Alkaline Phosphatase: 102 U/L (ref 38–126)
Anion gap: 14 (ref 5–15)
BUN: 34 mg/dL — ABNORMAL HIGH (ref 8–23)
CO2: 20 mmol/L — ABNORMAL LOW (ref 22–32)
Calcium: 9 mg/dL (ref 8.9–10.3)
Chloride: 99 mmol/L (ref 98–111)
Creatinine, Ser: 2.56 mg/dL — ABNORMAL HIGH (ref 0.61–1.24)
GFR, Estimated: 26 mL/min — ABNORMAL LOW (ref >60)
Glucose, Bld: 118 mg/dL — ABNORMAL HIGH (ref 70–99)
Potassium: 4.1 mmol/L (ref 3.5–5.1)
Sodium: 133 mmol/L — ABNORMAL LOW (ref 135–145)
Total Bilirubin: 0.6 mg/dL (ref 0.3–1.2)
Total Protein: 6.9 g/dL (ref 6.5–8.1)

## 2020-09-17 LAB — TROPONIN I (HIGH SENSITIVITY)
Troponin I (High Sensitivity): 22 ng/L — ABNORMAL HIGH (ref <18)
Troponin I (High Sensitivity): 22 ng/L — ABNORMAL HIGH (ref <18)

## 2020-09-17 LAB — BRAIN NATRIURETIC PEPTIDE: B Natriuretic Peptide: 2414.5 pg/mL — ABNORMAL HIGH (ref 0.0–100.0)

## 2020-09-17 LAB — LACTIC ACID, PLASMA: Lactic Acid, Venous: 1 mmol/L (ref 0.5–1.9)

## 2020-09-17 MED ORDER — TICAGRELOR 90 MG PO TABS
90.0000 mg | ORAL_TABLET | Freq: Two times a day (BID) | ORAL | Status: DC
  Filled 2020-09-17: qty 1

## 2020-09-17 MED ORDER — INSULIN ASPART 100 UNIT/ML ~~LOC~~ SOLN: 0.0000 [IU] | Freq: Every day | SUBCUTANEOUS | Status: DC

## 2020-09-17 MED ORDER — CLONAZEPAM 0.5 MG PO TABS: 0.5000 mg | ORAL_TABLET | Freq: Two times a day (BID) | ORAL | Status: DC | PRN

## 2020-09-17 MED ORDER — ACETAMINOPHEN 325 MG PO TABS: 650.0000 mg | ORAL_TABLET | ORAL | Status: DC | PRN

## 2020-09-17 MED ORDER — TAMSULOSIN HCL 0.4 MG PO CAPS: 0.4000 mg | ORAL_CAPSULE | Freq: Two times a day (BID) | ORAL | Status: DC

## 2020-09-17 MED ORDER — POTASSIUM CHLORIDE CRYS ER 20 MEQ PO TBCR
20.0000 meq | EXTENDED_RELEASE_TABLET | Freq: Every day | ORAL | Status: DC
  Filled 2020-09-17: qty 1

## 2020-09-17 MED ORDER — FUROSEMIDE 10 MG/ML IJ SOLN: 40.0000 mg | Freq: Two times a day (BID) | INTRAMUSCULAR | Status: DC

## 2020-09-17 MED ORDER — INSULIN ASPART 100 UNIT/ML ~~LOC~~ SOLN: 0.0000 [IU] | Freq: Three times a day (TID) | SUBCUTANEOUS | Status: DC

## 2020-09-17 MED ORDER — CARVEDILOL 12.5 MG PO TABS: 12.5000 mg | ORAL_TABLET | Freq: Two times a day (BID) | ORAL | Status: DC

## 2020-09-17 MED ORDER — ARIPIPRAZOLE 5 MG PO TABS
5.0000 mg | ORAL_TABLET | Freq: Every day | ORAL | Status: DC
  Filled 2020-09-17: qty 1

## 2020-09-17 MED ORDER — ASPIRIN EC 81 MG PO TBEC
81.0000 mg | DELAYED_RELEASE_TABLET | Freq: Every day | ORAL | Status: DC
  Filled 2020-09-17: qty 1

## 2020-09-17 MED ORDER — FUROSEMIDE 10 MG/ML IJ SOLN
40.0000 mg | Freq: Once | INTRAMUSCULAR | Status: AC
  Administered 2020-09-17: 40 mg via INTRAVENOUS
  Filled 2020-09-17: qty 4

## 2020-09-17 MED ORDER — ISOSORBIDE MONONITRATE ER 30 MG PO TB24
60.0000 mg | ORAL_TABLET | Freq: Every day | ORAL | Status: DC
  Filled 2020-09-17: qty 2

## 2020-09-17 MED ORDER — SODIUM CHLORIDE 0.9% FLUSH: 3.0000 mL | INTRAVENOUS | Status: DC | PRN

## 2020-09-17 MED ORDER — SODIUM CHLORIDE 0.9 % IV SOLN: 250.0000 mL | INTRAVENOUS | Status: DC | PRN

## 2020-09-17 MED ORDER — SODIUM CHLORIDE 0.9% FLUSH: 3.0000 mL | Freq: Two times a day (BID) | INTRAVENOUS | Status: DC

## 2020-09-17 MED ORDER — BUPROPION HCL ER (XL) 150 MG PO TB24
300.0000 mg | ORAL_TABLET | Freq: Every day | ORAL | Status: DC
  Filled 2020-09-17: qty 2

## 2020-09-17 MED ORDER — HEPARIN SODIUM (PORCINE) 5000 UNIT/ML IJ SOLN
5000.0000 [IU] | Freq: Three times a day (TID) | INTRAMUSCULAR | Status: DC
  Filled 2020-09-17: qty 1

## 2020-09-17 MED ORDER — NITROGLYCERIN 0.4 MG SL SUBL: 0.4000 mg | SUBLINGUAL_TABLET | SUBLINGUAL | Status: DC | PRN

## 2020-09-17 MED ORDER — TAMSULOSIN HCL 0.4 MG PO CAPS: 0.4000 mg | ORAL_CAPSULE | Freq: Every day | ORAL | Status: DC

## 2020-09-17 MED ORDER — ALBUTEROL SULFATE HFA 108 (90 BASE) MCG/ACT IN AERS: 2.0000 | INHALATION_SPRAY | RESPIRATORY_TRACT | Status: DC | PRN

## 2020-09-17 NOTE — ED Notes (Signed)
Pt keeps pulling off ECG monitor. Educated patient on why he needs to keep the monitor on but continues to refuse.

## 2020-09-17 NOTE — Telephone Encounter (Signed)
Ok I hope they admit him, he did not seem like someone who should be or could be at home and they tried for SNF last time

## 2020-09-17 NOTE — ED Triage Notes (Addendum)
Patient arrived by [facility] for reported possible abscess to chest. Patient has been receiving IV antibiotics for osteomylitis of humerus. Patient states that last treatment was over the weekend. Patient did not have CT showing abscess but has right anterior CP BP fluctuating 50 points from right to left arm, EMS RECORDED SAME

## 2020-09-17 NOTE — ED Notes (Signed)
Pt left AMA °

## 2020-09-17 NOTE — Telephone Encounter (Signed)
-----   Message from Truman Hayward, MD sent at 09/16/2020  9:45 PM EST ----- Regarding: FW: Amoxicillin Lets touch base with him tomorrow and ask him to see PCP re his symptoms. I am ok to change to ceftin 500mg  bid if need be ----- Message ----- From: Roney Jaffe, CPhT Sent: 09/16/2020   1:43 PM EST To: Truman Hayward, MD Subject: Amoxicillin                                    Hello Dr.Van [place],  Patient called and left a voicemail saying he stopped taking the Amoxicillin because he had swelling in his stomach and had to quit.  His RN came to see him today and he said she told him he may have fluid on his lungs.  He wanted to know is there another antibiotic he should be on? His phone # is [phone].    Thank You,  Ileene Patrick, Stella Patient [facility] Phone: [phone] Fax: [phone]

## 2020-09-17 NOTE — Telephone Encounter (Signed)
I spoke with patient this morning and patient states he feels like he has increased SOB and weakness.  He also reports still having some swelling in his stomach.  Patient states he has been sleeping sitting up the past 2 nights.  I have advised the patient to go to the ER and he stated his pcp also recommended he go to the ER last night, but he did not have a ride.  Patient will be calling 911 today to get to the ER and stated he will go to [facility]. [name] T Brooks Sailors

## 2020-09-17 NOTE — ED Notes (Signed)
Patient transported to CT 

## 2020-09-17 NOTE — H&P (Signed)
History and Physical    Danis Pembleton TGP:[id number] DOB: 01-Oct-1945 DOA: 09/17/2020  PCP: Doreatha Lew, MD   Patient coming from:  Home  Chief Complaint:  Dyspnea, orthopnea, fatigue and weakness  HPI: [name] is a 75 y.o. male with medical history significant for  chronic heart failure, DM with completication of neuropathy, PAD, ASVD diffuse. He has a h/o chronic shoulder problems. He was admitted 07/09/20-08/01/20 for septic arthritis both shoulders with GBS and Pseudomonas with septic shock. He was also found to have osteomyelitis right humerus. A PICC line was placed and the patient was discharged on 8 weeks of cefipeme 2g TID to end January 28th.  He was seen by Dr. [name] for ID follow-up 08/21/20 and was readmitted at that time. He has now completed his outpatient IV antibiotic regimen. He finished antibiotics last Friday, 09/12/20.  He reports having increased weakness and fatigue last few days.  Reports having shortness of breath with exertion and has been having increased orthopnea.  He states he has been having to sleep in a recliner for the last few nights.  He has chronic swelling of his legs which is not significantly worse he states.  Reports he has not had any fevers, chills, abdominal pain, nausea, vomiting, diarrhea, rash.  He reports having some right-sided chest pain that occurs with a cough that is nonproductive.  He has a live-in caregiver who helps him.  Patient reports he has been able to eat and drink. He has been taking his medications  ED Course:     Emergency room patient is hemodynamically stable.  Continues have elevated creatinine level but is improved from a week ago.  White blood cell count is normal.  He has mild hyponatremia which is stable.  He generally does not look well but is nontoxic appearing.  ER provider discussed with on-call infectious disease who recommended patient be placed in the hospital and infectious disease will see in the  morning.  Repeat blood cultures were obtained in the emergency room.  Review of Systems:  General: Denies fever, chills, weight loss, night sweats.  Denies dizziness.  Denies change in appetite HENT: Denies head trauma, headache, denies change in hearing, tinnitus.  Denies nasal congestion or bleeding.  Denies sore throat, sores in mouth.  Denies difficulty swallowing Eyes: Denies blurry vision, pain in eye, drainage.  Denies discoloration of eyes. Neck: Denies pain.  Denies swelling.  Denies pain with movement. Cardiovascular: Reports right-sided lateral chest pain cough.  No palpitations.  Reports chronic edema.  Reports orthopnea Respiratory: Reports shortness of breath with exertion, Has nonproductive cough.  Denies wheezing.   Gastrointestinal: Denies abdominal pain, swelling. Denies nausea, vomiting, diarrhea. Denies melena.  Denies hematemesis. Musculoskeletal: Denies limitation of movement. Reports chronic shoulder pain. Genitourinary: Denies pelvic pain.  Denies urinary frequency or hesitancy.  Denies dysuria.  Skin: Denies rash.  Denies petechiae, purpura, ecchymosis. Neurological: Denies headache.  Denies syncope.  Denies seizure activity. Denies slurred speech, drooping face.  Denies visual change. Psychiatric: Denies depression, anxiety. Denies hallucinations.  Past Medical History:  Diagnosis Date  . Acute renal failure (ARF) ([place]) 08/21/2020  . Anemia    NOS  . Anxiety   . 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 Blurry vision 09/10/2020  . BPH (benign prostatic hypertrophy)   . Broken foot Oct. 12, 2013   Right foot Fx  . CAD (coronary artery disease)   . Carotid stenosis, left   .  Cellulitis and abscess of leg 03/2018   right leg  . CHF (congestive heart failure) ([place])   . Chronic hyponatremia   . Chronic pain syndrome   . COPD (chronic obstructive pulmonary disease) ([place])   . Depression   . Diabetes mellitus   . Diabetic foot ulcer ([place])   . DJD  (degenerative joint disease)    L wrist  . DM2 (diabetes mellitus, type 2) ([place])   . GERD (gastroesophageal reflux disease)   . HLD (hyperlipidemia)   . HTN (hypertension)   . Hx of blood transfusion reaction   . Hyponatremia   . Lumbar disc disease   . MI (mitral incompetence)   . Myocardial infarction ([place])    x 2  . Osteoporosis    tx per [facility], Dr Prudencio Burly, thought due to heavy steriod use after 1978  . Peripheral neuropathy   . Peripheral vascular disease ([place])   . Restless leg syndrome   . Spine fracture    hx, multiple  . Varicose veins   . 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 Vitamin D deficiency     Past Surgical History:  Procedure Laterality Date  . ANGIOPLASTY    . aorta bifemoral bypass grafting  09/2010  . CARDIAC CATHETERIZATION N/A 09/19/2015   Procedure: Left Heart Cath and Coronary Angiography;  Surgeon: Adrian Prows, MD;  Location: [facility];  Service: Cardiovascular;  Laterality: N/A;  . CARPAL TUNNEL RELEASE     right 2006/ left 2007  . CATARACT EXTRACTION     left 1996/ right 1997  . CORONARY ARTERY BYPASS GRAFT N/A 09/22/2015   Procedure: CORONARY ARTERY BYPASS GRAFTING (CABG) times four using the right greater saphenous vein harvested endoscopically and the left internal mammary artery.  LIMA-LAD, SEQ SVG-DIAG & OM, SVG-PD.;  Surgeon: [name], MD;  Location: [facility] [facility];  Service: Open Heart Surgery;  Laterality: N/A;  . I & D EXTREMITY Right 04/07/2018   Procedure: IRRIGATION AND DEBRIDEMENT ABSCESS RIGHT LEG;  Surgeon: Newt Minion, MD;  Location: [facility];  Service: Orthopedics;  Laterality: Right;  . INCISION AND DRAINAGE OF WOUND Right 05/07/2020   Procedure: RIGHT LONG FINGER IRRIGATION AND DEBRIDEMENT AND AMPUTATION;  Surgeon: Iran Planas, MD;  Location: [facility];  Service: Orthopedics;  Laterality: Right;  with IV sedation  . INCISION AND DRAINAGE OF WOUND Right 05/12/2020   Procedure: IRRIGATION AND DEBRIDEMENT WOUND and possible  revision amputation;  Surgeon: Iran Planas, MD;  Location: [facility];  Service: Orthopedics;  Laterality: Right;  needs 60 minutes  . IR FLUORO GUIDE CV LINE LEFT  08/25/2020  . IR FLUORO GUIDE CV LINE RIGHT  07/18/2020  . IR US GUIDE VASC ACCESS LEFT  08/25/2020  . IR US GUIDE VASC ACCESS RIGHT  07/18/2020  . L foot open repair jones fracture  2010    5th metetarsal   . left ankle ganglion cyst  1976  . left carotid endarterectomy  03/2011  . left cataract  1996   right - 1997  . left CTS  2006   R CTS - 2007  . left foot surgery  1998   R surgery/fracture - 1999  . left plantar ganglion cystectomy  1979  . left wrist/hand fusion  2008  . [place], 2004  . NASAL SINUS SURGERY     multiple- x8. last 1997 with obliteration  . REPAIR THORACIC AORTA  2017  . right hand  fracture  1969  . ROTATOR CUFF REPAIR  2006   R, than repeat 2011, Dr Theda Sers  . SHOULDER ARTHROSCOPY Bilateral 07/17/2020   Procedure: Right shoulder arthroscopic lavage; Left shoulder aspiration.;  Surgeon: Justice Britain, MD;  Location: [facility];  Service: Orthopedics;  Laterality: Bilateral;  167mn  . SHOULDER ARTHROSCOPY WITH LABRAL REPAIR Left 07/10/2020   Procedure: SHOULDER ARTHROSCOPY WITH WASHOUT;  Surgeon: SJustice Britain MD;  Location: [facility];  Service: Orthopedics;  Laterality: Left;  . TRIGGER FINGER RELEASE Left 11/11/2014   Procedure: LEFT LONG FINGER RELEASE TRIGGER FINGER/A-1 PULLEY;  Surgeon: DMilly Jakob MD;  Location: [facility]  Service: Orthopedics;  Laterality: Left;  . vocal surgery  1996    Social History  reports that he quit smoking about 31 years ago. His smoking use included cigarettes. He has a 45.00 pack-year smoking history. He has never used smokeless tobacco. He reports previous alcohol use. He reports that he does not use drugs.  Allergies  Allergen Reactions  . Ivp Dye [Iodinated Diagnostic Agents]     Blood Pressure dropped----pt was pre-medicated with  13 hour prep and did fine with pre-meds--amy 03/09/11   . Sulfa Antibiotics Hives  . Sulfonamide Derivatives Hives and Itching  . Lisinopril Cough  . Metoprolol Nausea And Vomiting  . Statins Other (See Comments)    myalgias  . Propoxyphene N-Acetaminophen Other (See Comments)    Sharp pains- headache    Family History  Problem Relation Age of Onset  . Lung cancer Father 541 . Diabetes Mother 754 . Osteoporosis Mother   . Depression Mother   . Bleeding Disorder Mother   . Throat cancer Other        family hx - "bad living" - also lung CA, heart disease and renal failure    . Diabetes Sister   . Suicidality Son 423 . Lung cancer Maternal Grandmother 552 . Diabetes Maternal Grandfather 653 . Heart disease Maternal Grandfather   . Heart attack Paternal Grandmother   . Brain cancer Paternal Grandfather [phone]    Prior to Admission medications   Medication Sig Start Date End Date Taking? Authorizing Provider  ACCU-CHEK FASTCLIX LANCETS MISC Use as directed to check blood glucose. Dx: E11.51 01/17/18   ELauree Chandler NP  acetaminophen (TYLENOL) 500 MG tablet Take 1,000 mg by mouth every 6 (six) hours as needed for mild pain, fever or headache.    [provider]  albuterol (VENTOLIN HFA) 108 (90 Base) MCG/ACT inhaler Inhale 2 puffs into the lungs every 6 (six) hours as needed for wheezing or shortness of breath.    [provider]  amoxicillin (AMOXIL) 500 MG capsule Take 1 capsule (500 mg total) by mouth 2 (two) times daily. 09/10/20   VTruman Hayward MD  ARIPiprazole (ABILIFY) 5 MG tablet Take 1 tablet (5 mg total) by mouth daily. 09/04/20   GBarb Merino MD  aspirin EC 81 MG tablet Take 1 tablet (81 mg total) by mouth daily. 07/15/20   DSpero Geralds MD  Blood Glucose Monitoring Suppl (ACCU-CHEK AVIVA PLUS) w/Device KIT Use as directed to check blood glucose daily. Dx: E11.51 01/17/18   ELauree Chandler NP  BRILINTA 90 MG TABS tablet Take 90 mg by mouth  2 (two) times daily. 11/21/19   [provider]  buPROPion (WELLBUTRIN XL) 300 MG 24 hr tablet Take 1 tablet (300 mg total) by mouth daily. 07/24/20   HAddison Lank  PA-C  carvedilol (COREG) 12.5 MG tablet Take 1 tablet (12.5 mg total) by mouth 2 (two) times daily with a meal. 09/04/20   Barb Merino, MD  Cholecalciferol (VITAMIN D3) 5000 units TABS Take 5,000 Units by mouth daily.    [provider]  clonazePAM (KLONOPIN) 0.5 MG tablet Take 1 tablet (0.5 mg total) by mouth 2 (two) times daily as needed for up to 7 days for anxiety. 09/04/20 09/11/20  Barb Merino, MD  gabapentin (NEURONTIN) 100 MG capsule Take 1 capsule (100 mg total) by mouth 2 (two) times daily. 09/04/20   Barb Merino, MD  glucose blood (ACCU-CHEK AVIVA PLUS) test strip 1 each by Other route as needed for other. Use as instructed. Dx: E11.51 01/17/18   [name], NP  insulin aspart (NOVOLOG) 100 UNIT/ML injection Inject 0-9 Units into the skin 3 (three) times daily with meals. 09/04/20   Barb Merino, MD  IRON PO Take 1 capsule by mouth daily.    [provider]  isosorbide mononitrate (IMDUR) 60 MG 24 hr tablet Take 60 mg by mouth daily. 12/11/19   [provider]  Multiple Vitamin (MULTIVITAMIN WITH MINERALS) TABS tablet Take 1 tablet by mouth daily.    [provider]  NARCAN 4 MG/0.1ML LIQD nasal spray kit Place 4 mg into the nose as needed (opioid reversal). 06/18/19   [provider]  nitroGLYCERIN (NITROSTAT) 0.4 MG SL tablet PLACE 1 TABLET UNDER THE TONGUE EVERY 5 MINUTES AS NEEDED FOR CHEST PAIN Patient taking differently: No sig reported 07/17/19   Adrian Prows, MD  nystatin (MYCOSTATIN/NYSTOP) powder Apply 1 application topically 3 (three) times daily. 09/10/20   Truman Hayward, MD  pantoprazole (PROTONIX) 40 MG tablet Take 1 tablet (40 mg total) by mouth 2 (two) times daily. 09/04/20   Barb Merino, MD  rOPINIRole (REQUIP) 1 MG tablet Take 1 tablet (1 mg  total) by mouth in the morning and at bedtime. 07/24/20   Addison Lank, PA-C  tamsulosin (FLOMAX) 0.4 MG CAPS capsule Take 1 capsule (0.4 mg total) by mouth 2 (two) times daily. 11/16/18   [name], NP    Physical Exam: Vitals:   09/17/20 1845 09/17/20 1900 09/17/20 1915 09/17/20 1930  BP:      Pulse: 91 83 79 80  Resp:      Temp:      TempSrc:      SpO2: 97% 100% 100% 97%    Constitutional: NAD, calm, comfortable Vitals:   09/17/20 1845 09/17/20 1900 09/17/20 1915 09/17/20 1930  BP:      Pulse: 91 83 79 80  Resp:      Temp:      TempSrc:      SpO2: 97% 100% 100% 97%   General: WNWD male in no distress Eyes: PERRL, lids and conjunctivae normal. No discharge. Nonicteric ENMT: Ears without external lesions. Nares patent. Mucous membranes are appear dry. Posterior pharynx clear of any exudate or lesions.  Neck: normal, supple, no masses, no thyromegaly. Trachea midline Chest - no deformity. PICC line right anterior chest - no swelling, erythema. Healed midline chest and abdominal incision Respiratory: clear to auscultation bilaterally, no wheezing, no crackles. Normal respiratory effort. No accessory muscle use.  Cardiovascular: Regular rate and rhythm, no murmurs / rubs / gallops. Has +1 lower extremity edema bilaterally. trace pedal pulses with cold feet. No carotid bruits.  Abdomen: no tenderness, no masses palpated. No hepatosplenomegaly. Bowel sounds positive.  Musculoskeletal:  Normal  range of motion. No erythema, deformity or effusions.  Skin: no rashes, lesions, ulcers. No induration. Feet are cool to touch. Neurologic: CN 2-12 grossly intact. Sensation intact,  Strength 4/5 in all 4 extremities.  Psychiatric: Normal judgment and insight. Alert and oriented x 3. Normal mood.    Labs on Admission: I have personally reviewed following labs and imaging studies  CBC: Recent Labs  Lab 09/17/20 1342  WBC 6.6  NEUTROABS 4.5  HGB 9.1*  HCT 28.2*  MCV 94.3  PLT  465    Basic Metabolic Panel: Recent Labs  Lab 09/17/20 1342  NA 133*  K 4.1  CL 99  CO2 20*  GLUCOSE 118*  BUN 34*  CREATININE 2.56*  CALCIUM 9.0    GFR: CrCl cannot be calculated (Unknown ideal weight.).  Liver Function Tests: Recent Labs  Lab 09/17/20 1342  AST 21  ALT 18  ALKPHOS 102  BILITOT 0.6  PROT 6.9  ALBUMIN 2.5*    Urine analysis:    Component Value Date/Time   COLORURINE YELLOW 08/24/2020 0748   APPEARANCEUR CLEAR 08/24/2020 0748   LABSPEC 1.012 08/24/2020 0748   PHURINE 6.0 08/24/2020 0748   GLUCOSEU NEGATIVE 08/24/2020 0748   GLUCOSEU NEGATIVE 11/15/2014 1003   HGBUR SMALL (A) 08/24/2020 0748   BILIRUBINUR NEGATIVE 08/24/2020 0748   KETONESUR NEGATIVE 08/24/2020 0748   PROTEINUR 100 (A) 08/24/2020 0748   UROBILINOGEN 0.2 11/15/2014 1003   NITRITE NEGATIVE 08/24/2020 0748   LEUKOCYTESUR SMALL (A) 08/24/2020 0748    Radiological Exams on Admission: DG Chest 2 View  Result Date: 09/17/2020 CLINICAL DATA:  Possible right chest abscess EXAM: CHEST - 2 VIEW COMPARISON:  09/05/2020 chest radiograph. FINDINGS: Right subclavian Port-A-Cath tip is in the azygos vein, new from prior radiograph. Stable discontinuity in the lower most sternotomy wire with otherwise intact sternotomy wires. Stable cardiomediastinal silhouette with top-normal heart size. No pneumothorax. No pleural effusion. Cephalization of the pulmonary vasculature without overt pulmonary edema. No acute consolidative airspace disease. IMPRESSION: Right subclavian Port-A-Cath tip is in the azygos vein, new from prior radiograph. No acute cardiopulmonary disease. Electronically Signed   By: Ilona Sorrel M.D.   On: 09/17/2020 14:25    EKG: Independently reviewed.  EKG shows normal sinus rhythm with occasional PVCs.  QTc is 484. No acute ST elevation or depression.  Assessment/Plan      (HFpEF) heart failure with preserved ejection fraction  Mr. Barge with hx of HFpEF. Has been having  increased dyspnea with exertion and orthopnea over the last week he reports.  He has chronic peripheral edema which is not significantly changed she states.  He had echocardiogram in November 2021 which showed EF of 50% with good LV and RV function.  Patient be given dose of Lasix IV in the emergency room.  Give 2 more doses of Lasix over the next 24 hours.,  Monitor I&Os.   Active Problems:   Essential hypertension Continue home medications of Coreg, Imdur.  Monitor blood pressure    AKI (acute kidney injury)  Patient with AKI and creatinine of 2.5.  Patient developed AKI month ago and labs and is slowly been improving over the last month.  Continue to monitor.    DM (diabetes mellitus), type 2 with neurological complications  With recent hemoglobin A1c of 6.5.  Will not repeat A1c at this time. Monitor blood sugars with meals and at bedtime.  Sliding scale insulin as needed for glycemic control. Patient reports he has been on Januvia and he  was advised to stop taking it but he continues to use it since his discharge.  Hold Januvia at this time with HFpEF as Januvia can cause exasperation of heart failure    Dyspnea Supplemental oxygen as required. Diuresis as above. RT to follow. Albuterol MDI with spacer as needed.    Weakness Consult PT for evaluation.     DVT prophylaxis: Heparin for DVT prophylaxis.  Code Status:   Full code  Family Communication:  Diagnosis and plan discussed with patient.  Patient verbalized understanding and agrees with plan.  Further recommendations to follow as clinically indicated Disposition Plan:   Patient is from:  Home  Anticipated DC to:  Home versus SNF  Anticipated DC date:  Anticipate discharge in less than 2 midnights  Anticipated DC barriers: If SNF is determined to be required and patient is agreeable may take a day or      to to arrange  Consults called:  ID was consulted by ER provider and will see pt in am.  Admission status:   Observation   Eben Burow MD [facility]  How to contact the [facility] Attending or Consulting provider [place] or covering provider during after hours [place], for this patient?   1. Check the care team in [facility] and look for a) attending/consulting [facility] provider listed and b) the [facility] team listed 2. Log into [email or web address] and use [place]'s universal password to access. If you do not have the password, please contact the hospital operator. 3. Locate the [facility] provider you are looking for under [facility] and page to a number that you can be directly reached. 4. If you still have difficulty reaching the provider, please page the [facility] (Director on Call) for the Hospitalists listed on amion for assistance.  09/17/2020, 8:08 PM

## 2020-09-17 NOTE — ED Provider Notes (Signed)
[facility] EMERGENCY DEPARTMENT Provider Note   CSN: [id number] Arrival date & time: 09/17/20  1249     History No chief complaint on file.   Rafiq Bucklin is a 75 y.o. male with PMH of DM, PAD, chronic systolic heart failure, and recent hospitalization on 08/22/2020 just recently discharged on 09/06/2020 for septic arthritis of bilateral shoulder and osteomyelitis of wrist s/p multiple procedures and aspirations who returns to the ED with complaints of possible abscess to the chest.  Discharged with PICC line and outpatient IV cefepime until 09/12/20.   I reviewed he is anticoagulated he is on [facility] medical record and he was evaluated by his primary care provider Dr. [name] on 09/10/2020 and he felt as though patient need to be admitted to the hospital and was having failure to thrive at home.  Patient tells me that he hired a young woman from [place] who is not an Therapist, sports who has been managing his medications and treatments at home.  Last dose of IV antibiotics was on 09/16/2020.  Dr. [name] spoke with patient this morning and again encouraged him to come to the ED for admission and ultimate SNF placement.  At time of discharge on 09/06/2020, he was encouraged to go to SNF team, but he declined.  On my examination, patient reports that he is having right-sided chest pain with inspiration.  He tells me that his primary care provider was concerned that he was developing an abscess.  There is no obvious skin changes or areas of fluctuance concerning for an ER chest wall suggested there is no overlying skin changes and superficial infections I expect he can see some swelling something that bscess on my examination.  However, he is stating that he is mildly short of breath and has pleuritic symptoms.  He has been taking his medications including his aspirin and Brilinta, as directed.  Patient states that he was doing better which is why he didn't initially come to the ED per request of his  infectious disease specialist, but over the course of the past 72 hours he has been feeling weak and "terrible".  He does endorse a mild cough and dyspnea, particular with exertion.  Patient denies any fevers, chills, inability eat or drink, left-sided chest pain, hemoptysis, unusual unilateral swelling or edema, or other pleuritic symptoms.  HPI     Past Medical History:  Diagnosis Date  . Acute renal failure (ARF) ([place]) 08/21/2020  . Anemia    NOS  . Anxiety   . 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 Blurry vision 09/10/2020  . BPH (benign prostatic hypertrophy)   . Broken foot Oct. 12, 2013   Right foot Fx  . CAD (coronary artery disease)   . Carotid stenosis, left   . Cellulitis and abscess of leg 03/2018   right leg  . Chronic hyponatremia   . Chronic pain syndrome   . COPD (chronic obstructive pulmonary disease) ([place])   . Depression   . Diabetes mellitus   . Diabetic foot ulcer ([place])   . DJD (degenerative joint disease)    L wrist  . DM2 (diabetes mellitus, type 2) ([place])   . GERD (gastroesophageal reflux disease)   . HLD (hyperlipidemia)   . HTN (hypertension)   . Hx of blood transfusion reaction   . Hyponatremia   . Lumbar disc disease   . MI (mitral incompetence)   . Myocardial infarction ([place])  x 2  . Osteoporosis    tx per [facility], Dr Prudencio Burly, thought due to heavy steriod use after 1978  . Peripheral neuropathy   . Peripheral vascular disease ([place])   . Restless leg syndrome   . Spine fracture    hx, multiple  . Varicose veins   . 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 Vitamin D deficiency     Patient Active Problem List   Diagnosis Date Noted  . Blurry vision 09/10/2020  . Intertrigo 09/10/2020  . Encephalopathy acute   . Septic arthritis of shoulder ([place]) 08/22/2020  . Osteomyelitis of right humerus ([place]) 08/22/2020  . AKI (acute kidney injury) ([place]) 08/21/2020  . Sepsis due to group B Streptococcus ([place])   .  Pyogenic arthritis of left shoulder region ([place])   . Acute pain of right shoulder   . Acute respiratory failure ([place]) 07/12/2020  . Shortness of breath   . Status post aortobifemoral bypass surgery   . History of carotid endarterectomy   . Type 2 diabetes mellitus with hyperlipidemia ([place])   . Diabetes mellitus type II, non insulin dependent ([place])   . Septic shock ([place]) 07/09/2020  . Decubitus ulcer 05/12/2020  . Amputation of right middle finger 05/12/2020  . Long term (current) use of opiate analgesic 05/07/2020  . Pain in left foot 05/07/2020  . Neck pain 05/07/2020  . Chronic systolic congestive heart failure, NYHA class 3 ([place]) 01/08/2020  . Constipation 01/07/2020  . CAD (coronary artery disease) 01/03/2020  . Hyperlipidemia associated with type 2 diabetes mellitus ([place]) 01/03/2020  . S/P coronary artery stent placement 01/03/2020  . Major depressive disorder, recurrent episode, severe, with psychotic behavior ([place]) 12/29/2019  . Delusional disorder ([place]) 12/29/2019  . NSTEMI (non-ST elevated myocardial infarction) ([place]) 05/19/2019  . MRSA infection 01/18/2019  . Occipital neuralgia 01/18/2019  . Osteomyelitis of left foot ([place]) 01/18/2019  . Type 2 diabetes mellitus with diabetic peripheral angiopathy without gangrene, without long-term current use of insulin ([place]) 01/18/2019  . Acute on chronic diastolic congestive heart failure ([place]) 01/15/2019  . Varus foot deformity, acquired, left 11/28/2018  . Generalized anxiety disorder 07/03/2018  . Restless leg syndrome 07/03/2018  . Paranoia ([place]) 07/03/2018  . PTSD (post-traumatic stress disorder) 05/29/2018  . Abscess of right lower leg   . Severe protein-calorie malnutrition ([place])   . 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 ([place])   . Diabetic polyneuropathy associated with type 2 diabetes mellitus ([place])   . Cellulitis 04/01/2018  . Cellulitis of right lower extremity 04/01/2018  .  Tachycardia 04/01/2018  . Hx of CABG 09/22/2015  . Unstable angina ([place]) 09/20/2015  . Chest pain, rule out acute myocardial infarction 09/18/2015  . Diabetes mellitus type 2 with peripheral artery disease ([place]) 09/18/2015  . Hyponatremia 09/18/2015  . Chest pain on exertion 09/18/2015  . Varicose veins of bilateral lower extremities with other complications 63/08/6008  . Occlusion and stenosis of carotid artery without mention of cerebral infarction 10/18/2011  . SINUSITIS, ACUTE 03/13/2010  . BACK PAIN 11/13/2009  . FATIGUE 10/17/2009  . Diabetic neuropathy associated with diabetes mellitus due to underlying condition ([place]) 08/29/2009  . Vitamin D deficiency 08/29/2009  . Hyposmolality and/or hyponatremia 08/29/2009  . VISUAL ACUITY, DECREASED, LEFT EYE 08/29/2009  . Peripheral artery disease ([place]) 08/29/2009  . SINUSITIS, CHRONIC 08/29/2009  . NEPHROLITHIASIS 08/29/2009  . FIBROMYALGIA 08/29/2009  . BICEPS TENDON RUPTURE, RIGHT 08/11/2009  . DIABETES MELLITUS, TYPE  II 08/05/2009  . Hyperlipidemia 08/05/2009  . ANEMIA-NOS 08/05/2009  . Anxiety state 08/05/2009  . DEPRESSION 08/05/2009  . Chronic pain syndrome 08/05/2009  . Essential hypertension 08/05/2009  . GERD 08/05/2009  . BPH (benign prostatic hyperplasia) 08/05/2009  . [place] DISEASE, LUMBAR 08/05/2009  . Osteoporosis 08/05/2009    Past Surgical History:  Procedure Laterality Date  . ANGIOPLASTY    . aorta bifemoral bypass grafting  09/2010  . CARDIAC CATHETERIZATION N/A 09/19/2015   Procedure: Left Heart Cath and Coronary Angiography;  Surgeon: Adrian Prows, MD;  Location: [facility];  Service: Cardiovascular;  Laterality: N/A;  . CARPAL TUNNEL RELEASE     right 2006/ left 2007  . CATARACT EXTRACTION     left 1996/ right 1997  . CORONARY ARTERY BYPASS GRAFT N/A 09/22/2015   Procedure: CORONARY ARTERY BYPASS GRAFTING (CABG) times four using the right greater saphenous vein harvested endoscopically and the left  internal mammary artery.  LIMA-LAD, SEQ SVG-DIAG & OM, SVG-PD.;  Surgeon: [name], MD;  Location: [facility];  Service: Open Heart Surgery;  Laterality: N/A;  . I & D EXTREMITY Right 04/07/2018   Procedure: IRRIGATION AND DEBRIDEMENT ABSCESS RIGHT LEG;  Surgeon: Newt Minion, MD;  Location: [facility];  Service: Orthopedics;  Laterality: Right;  . INCISION AND DRAINAGE OF WOUND Right 05/07/2020   Procedure: RIGHT LONG FINGER IRRIGATION AND DEBRIDEMENT AND AMPUTATION;  Surgeon: Iran Planas, MD;  Location: [facility];  Service: Orthopedics;  Laterality: Right;  with IV sedation  . INCISION AND DRAINAGE OF WOUND Right 05/12/2020   Procedure: IRRIGATION AND DEBRIDEMENT WOUND and possible revision amputation;  Surgeon: Iran Planas, MD;  Location: [facility];  Service: Orthopedics;  Laterality: Right;  needs 60 minutes  . IR FLUORO GUIDE CV LINE LEFT  08/25/2020  . IR FLUORO GUIDE CV LINE RIGHT  07/18/2020  . IR US GUIDE VASC ACCESS LEFT  08/25/2020  . IR US GUIDE VASC ACCESS RIGHT  07/18/2020  . L foot open repair jones fracture  2010    5th metetarsal   . left ankle ganglion cyst  1976  . left carotid endarterectomy  03/2011  . left cataract  1996   right - 1997  . left CTS  2006   R CTS - 2007  . left foot surgery  1998   R surgery/fracture - 1999  . left plantar ganglion cystectomy  1979  . left wrist/hand fusion  2008  . [place], 2004  . NASAL SINUS SURGERY     multiple- x8. last 1997 with obliteration  . REPAIR THORACIC AORTA  2017  . right hand fracture  1969  . ROTATOR CUFF REPAIR  2006   R, than repeat 2011, Dr Theda Sers  . SHOULDER ARTHROSCOPY Bilateral 07/17/2020   Procedure: Right shoulder arthroscopic lavage; Left shoulder aspiration.;  Surgeon: Justice Britain, MD;  [facility];  Service: Orthopedics;  Laterality: Bilateral;  177mn  . SHOULDER ARTHROSCOPY WITH LABRAL REPAIR Left 07/10/2020   Procedure: SHOULDER ARTHROSCOPY WITH WASHOUT;  Surgeon: SJustice Britain MD;   [facility];  Service: Orthopedics;  Laterality: Left;  . TRIGGER FINGER RELEASE Left 11/11/2014   Procedure: LEFT LONG FINGER RELEASE TRIGGER FINGER/A-1 PULLEY;  Surgeon: DMilly Jakob MD;  Location: [facility]  Service: Orthopedics;  Laterality: Left;  . vocal surgery  1996       Family History  Problem Relation Age of Onset  . Lung cancer Father 510 .  Diabetes Mother 25  . Osteoporosis Mother   . Depression Mother   . Bleeding Disorder Mother   . Throat cancer Other        family hx - "bad living" - also lung CA, heart disease and renal failure    . Diabetes Sister   . Suicidality Son 21  . Lung cancer Maternal Grandmother 85  . Diabetes Maternal Grandfather 3  . Heart disease Maternal Grandfather   . Heart attack Paternal Grandmother   . Brain cancer Paternal Grandfather 50    Social History   Tobacco Use  . Smoking status: Former Smoker    Packs/day: 1.50    Years: 30.00    Pack years: 45.00    Types: Cigarettes    Quit date: 11/06/1988    Years since quitting: 31.8  . Smokeless tobacco: Never Used  Vaping Use  . Vaping Use: Never used  Substance Use Topics  . Alcohol use: Not Currently    Comment: None in 30 years but drank beer before- 05/09/20  . Drug use: No    Home Medications Prior to Admission medications   Medication Sig Start Date End Date Taking? Authorizing Provider  ACCU-CHEK FASTCLIX LANCETS MISC Use as directed to check blood glucose. Dx: E11.51 01/17/18   [name], NP  acetaminophen (TYLENOL) 500 MG tablet Take 1,000 mg by mouth every 6 (six) hours as needed for mild pain, fever or headache.    [provider]  albuterol (VENTOLIN HFA) 108 (90 Base) MCG/ACT inhaler Inhale 2 puffs into the lungs every 6 (six) hours as needed for wheezing or shortness of breath.    [provider]  amoxicillin (AMOXIL) 500 MG capsule Take 1 capsule (500 mg total) by mouth 2 (two) times daily. 09/10/20   Truman Hayward, MD  ARIPiprazole (ABILIFY) 5 MG tablet Take 1 tablet (5 mg total) by mouth daily. 09/04/20   Barb Merino, MD  aspirin EC 81 MG tablet Take 1 tablet (81 mg total) by mouth daily. 07/15/20   Spero Geralds, MD  Blood Glucose Monitoring Suppl (ACCU-CHEK AVIVA PLUS) w/Device KIT Use as directed to check blood glucose daily. Dx: E11.51 01/17/18   [name], NP  BRILINTA 90 MG TABS tablet Take 90 mg by mouth 2 (two) times daily. 11/21/19   [provider]  buPROPion (WELLBUTRIN XL) 300 MG 24 hr tablet Take 1 tablet (300 mg total) by mouth daily. 07/24/20   Donnal Moat T, PA-C  carvedilol (COREG) 12.5 MG tablet Take 1 tablet (12.5 mg total) by mouth 2 (two) times daily with a meal. 09/04/20   Barb Merino, MD  Cholecalciferol (VITAMIN D3) 5000 units TABS Take 5,000 Units by mouth daily.    [provider]  clonazePAM (KLONOPIN) 0.5 MG tablet Take 1 tablet (0.5 mg total) by mouth 2 (two) times daily as needed for up to 7 days for anxiety. 09/04/20 09/11/20  Barb Merino, MD  gabapentin (NEURONTIN) 100 MG capsule Take 1 capsule (100 mg total) by mouth 2 (two) times daily. 09/04/20   Barb Merino, MD  glucose blood (ACCU-CHEK AVIVA PLUS) test strip 1 each by Other route as needed for other. Use as instructed. Dx: E11.51 01/17/18   [name], NP  insulin aspart (NOVOLOG) 100 UNIT/ML injection Inject 0-9 Units into the skin 3 (three) times daily with meals. 09/04/20   Barb Merino, MD  IRON PO Take 1 capsule by mouth daily.    [provider]  isosorbide mononitrate (IMDUR) 60 MG 24 hr tablet Take 60 mg by mouth daily. 12/11/19   [provider]  Multiple Vitamin (MULTIVITAMIN WITH MINERALS) TABS tablet Take 1 tablet by mouth daily.    [provider]  NARCAN 4 MG/0.1ML LIQD nasal spray kit Place 4 mg into the nose as needed (opioid reversal). 06/18/19   [provider]  nitroGLYCERIN (NITROSTAT) 0.4 MG SL tablet PLACE 1 TABLET  UNDER THE TONGUE EVERY 5 MINUTES AS NEEDED FOR CHEST PAIN Patient taking differently: No sig reported 07/17/19   Adrian Prows, MD  nystatin (MYCOSTATIN/NYSTOP) powder Apply 1 application topically 3 (three) times daily. 09/10/20   Truman Hayward, MD  pantoprazole (PROTONIX) 40 MG tablet Take 1 tablet (40 mg total) by mouth 2 (two) times daily. 09/04/20   Barb Merino, MD  rOPINIRole (REQUIP) 1 MG tablet Take 1 tablet (1 mg total) by mouth in the morning and at bedtime. 07/24/20   Addison Lank, PA-C  tamsulosin (FLOMAX) 0.4 MG CAPS capsule Take 1 capsule (0.4 mg total) by mouth 2 (two) times daily. 11/16/18   [name], NP    Allergies    Ivp dye [iodinated diagnostic agents], Sulfa antibiotics, Sulfonamide derivatives, Lisinopril, Metoprolol, Statins, and Propoxyphene n-acetaminophen  Review of Systems   Review of Systems  All other systems reviewed and are negative.   Physical Exam Updated Vital Signs BP (!) 113/56   Pulse 79   Temp 98.2 F (36.8 C) (Oral)   Resp 13   SpO2 100%   Physical Exam Vitals and nursing note reviewed. Exam conducted with a chaperone present.  Constitutional:      General: He is not in acute distress.    Appearance: He is not toxic-appearing.  HENT:     Head: Normocephalic and atraumatic.  Eyes:     General: No scleral icterus.    Conjunctiva/sclera: Conjunctivae normal.  Cardiovascular:     Rate and Rhythm: Normal rate and regular rhythm.     Pulses: Normal pulses.  Pulmonary:     Effort: Pulmonary effort is normal.     Breath sounds: Normal breath sounds. No rales.     Comments: No significant increased work of breathing.  No hypoxia or tachypnea.  Scattered rales in bases.  No distress. Abdominal:     General: Abdomen is flat. There is no distension.     Palpations: Abdomen is soft.     Tenderness: There is no abdominal tenderness. There is no guarding.  Musculoskeletal:     Right lower leg: Edema present.     Left lower leg:  Edema present.     Comments: Pitting edema in lower extremities bilaterally.  Worse in right lower extremity (chronic).    Skin:    General: Skin is dry.  Neurological:     General: No focal deficit present.     Mental Status: He is alert and oriented to person, place, and time.     GCS: GCS eye subscore is 4. GCS verbal subscore is 5. GCS motor subscore is 6.  Psychiatric:        Mood and Affect: Mood normal.        Behavior: Behavior normal.        Thought Content: Thought content normal.     ED Results / Procedures / Treatments   Labs (all labs ordered are listed, but only abnormal results are displayed) Labs Reviewed  COMPREHENSIVE METABOLIC PANEL - Abnormal; Notable for the following  components:      Result Value   Sodium 133 (*)    CO2 20 (*)    Glucose, Bld 118 (*)    BUN 34 (*)    Creatinine, Ser 2.56 (*)    Albumin 2.5 (*)    GFR, Estimated 26 (*)    All other components within normal limits  CBC WITH DIFFERENTIAL/PLATELET - Abnormal; Notable for the following components:   RBC 2.99 (*)    Hemoglobin 9.1 (*)    HCT 28.2 (*)    RDW 20.1 (*)    All other components within normal limits  TROPONIN I (HIGH SENSITIVITY) - Abnormal; Notable for the following components:   Troponin I (High Sensitivity) 22 (*)    All other components within normal limits  TROPONIN I (HIGH SENSITIVITY) - Abnormal; Notable for the following components:   Troponin I (High Sensitivity) 22 (*)    All other components within normal limits  SARS CORONAVIRUS 2 (TAT 6-24 HRS)  CULTURE, BLOOD (ROUTINE X 2)  CULTURE, BLOOD (ROUTINE X 2)  LACTIC ACID, PLASMA  LACTIC ACID, PLASMA  BRAIN NATRIURETIC PEPTIDE    EKG EKG Interpretation  Date/Time:  Wednesday September 17 2020 13:23:33 EST Ventricular Rate:  84 PR Interval:  148 QRS Duration: 128 QT Interval:  410 QTC Calculation: 484 R Axis:   91 Text Interpretation: Sinus rhythm with occasional Premature ventricular complexes Rightward axis  Non-specific intra-ventricular conduction block Abnormal ECG Confirmed by Carmin Muskrat [id number]) on 09/17/2020 4:05:15 PM   Radiology DG Chest 2 View  Result Date: 09/17/2020 CLINICAL DATA:  Possible right chest abscess EXAM: CHEST - 2 VIEW COMPARISON:  09/05/2020 chest radiograph. FINDINGS: Right subclavian Port-A-Cath tip is in the azygos vein, new from prior radiograph. Stable discontinuity in the lower most sternotomy wire with otherwise intact sternotomy wires. Stable cardiomediastinal silhouette with top-normal heart size. No pneumothorax. No pleural effusion. Cephalization of the pulmonary vasculature without overt pulmonary edema. No acute consolidative airspace disease. IMPRESSION: Right subclavian Port-A-Cath tip is in the azygos vein, new from prior radiograph. No acute cardiopulmonary disease. Electronically Signed   By: Ilona Sorrel M.D.   On: 09/17/2020 14:25    Procedures Procedures   Medications Ordered in ED Medications  furosemide (LASIX) injection 40 mg (40 mg Intravenous Given 09/17/20 1931)    ED Course  I have reviewed the triage vital signs and the nursing notes.  Pertinent labs & imaging results that were available during my care of the patient were reviewed by me and considered in my medical decision making (see chart for details).  Clinical Course as of 09/17/20 1931  Wed Sep 17, 2020  1644 I spoke with Dr. Gale Journey who agreed that patient would benefit from admission for failure to thrive and that they will see him tomorrow after admission to hospitalist services. [GG]  1931 I spoke with hospitalist who will see and admit patient for observation and IV diuresis.   [GG]    Clinical Course User Index [GG] Corena Herter, PA-C   MDM Rules/Calculators/A&P                          Toshiro Hanken was evaluated in Emergency Department on 09/17/2020 for the symptoms described in the history of present illness. He was evaluated in the context of the global COVID-19  pandemic, which necessitated consideration that the patient might be at risk for infection with the SARS-CoV-2 virus that causes COVID-19. Institutional protocols  and algorithms that pertain to the evaluation of patients at risk for COVID-19 are in a state of rapid change based on information released by regulatory bodies including the CDC and federal and state organizations. These policies and algorithms were followed during the patient's care in the ED.  I personally reviewed patient's medical chart and all notes from triage and staff during today's encounter. I have also ordered and reviewed all labs and imaging that I felt to be medically necessary in the evaluation of this patient's complaints and with consideration of their with their physical exam. If needed, translation services were available and utilized.   Patient presenting to the ED per recommendation of his primary care provider.  Patient is no longer taking his antibiotics in outpatient setting.  However, his primary care provider feels as though he is too ill to be managed by his local caretaker and instead would benefit from admission to hospital services and ultimate SNF disposition.  Will obtain CT imaging.  Laboratory work-up is largely unremarkable.  No new leukocytosis.  Lactic acid is normal.  AKI is improving.  I spoke with Dr. Gale Journey who agreed that patient would benefit from admission for failure to thrive and that they will see him tomorrow after admission to hospitalist services.  CT chest obtained without contrast due to poor renal function demonstrates no evidence of chest wall abscess.  There is cardiac enlargement and patchy areas of groundglass attenuation concerning for CHF.  He states that he has been feeling weak and terrible the past few days.  Suspect it may be CHF exacerbation.  Significant peripheral edema on exam.  Will obtain BNP.  Will also obtain blood cultures which can be followed by hospitalist.  Do not feel as  though antibiotics are emergently warranted.  Will start on 40 IV Lasix.  I spoke with hospitalist who will see and admit patient for observation and IV diuresis.     Final Clinical Impression(s) / ED Diagnoses Final diagnoses:  Acute pulmonary edema ([place])  Weakness    Rx / DC Orders ED Discharge Orders    None       Reita Chard 09/17/20 1932    Carmin Muskrat, MD 09/18/20 0005

## 2020-09-18 LAB — SARS CORONAVIRUS 2 (TAT 6-24 HRS): SARS Coronavirus 2: NEGATIVE

## 2020-09-22 LAB — CULTURE, BLOOD (ROUTINE X 2)
Culture: NO GROWTH
Special Requests: ADEQUATE

## 2020-09-28 ENCOUNTER — Other Ambulatory Visit: Payer: Self-pay | Admitting: Cardiology

## 2020-09-30 ENCOUNTER — Telehealth: Payer: Self-pay

## 2020-09-30 ENCOUNTER — Telehealth: Payer: Self-pay | Admitting: Physician Assistant

## 2020-09-30 ENCOUNTER — Ambulatory Visit: Payer: Medicare Other | Admitting: Physician Assistant

## 2020-09-30 NOTE — Telephone Encounter (Addendum)
RN relayed verbal order to [place] with [facility] to maintain patient's CVC until it is removed, per Dr. Johnnye Sima. Orders repeated and verified.   Patient is scheduled to have CVC removed at [facility] IR tomorrow 10/01/20 at 1:45pm. RN relayed this appointment time to [facility] and to patient.   RN called [name], patient's friend lsited in chart per Sharyn Lull, to give her patient's appointment time and location. She states she will arrange for someone to take the patient to his appointment. No further questions.    Beryle Flock, RN

## 2020-09-30 NOTE — Telephone Encounter (Signed)
Pt called to say that he had been having a lot more anxiety, almost every 5 mins. He took one extra Wellbutrin each day for about three days now. It is helping.  He is not taking Neurontin or Klonopin now. He seemed to not be aware that he had appt today at 1pm. He called later asking for it to be virtual.

## 2020-09-30 NOTE — Telephone Encounter (Signed)
Please maintain PIC until CVC removed

## 2020-09-30 NOTE — Telephone Encounter (Signed)
Received call from [place] with [facility]. She is requesting orders to pull PICC, she states patient missed appointment to have it removed in our office. RN advised Sharyn Lull that patient has a tunneled CVC and will need to have it removed in interventional radiology. Patient no showed his removal with IR on 09/16/20.   RN left message for [place] in IR to reschedule removal.   Beryle Flock, RN

## 2020-10-01 ENCOUNTER — Other Ambulatory Visit: Payer: Self-pay

## 2020-10-01 ENCOUNTER — Ambulatory Visit (HOSPITAL_COMMUNITY)
Admission: RE | Admit: 2020-10-01 | Discharge: 2020-10-01 | Disposition: A | Discharge status: Home / Self Care | Payer: Medicare Other | Source: Ambulatory Visit | Attending: Infectious Disease | Admitting: Infectious Disease

## 2020-10-01 DIAGNOSIS — M8600 Acute hematogenous osteomyelitis, unspecified site: Secondary | ICD-10-CM | POA: Insufficient documentation

## 2020-10-01 DIAGNOSIS — Z452 Encounter for adjustment and management of vascular access device: Secondary | ICD-10-CM | POA: Insufficient documentation

## 2020-10-01 DIAGNOSIS — M86221 Subacute osteomyelitis, right humerus: Secondary | ICD-10-CM

## 2020-10-01 HISTORY — PX: IR REMOVAL TUN CV CATH W/O FL: IMG2289

## 2020-10-01 MED ORDER — LIDOCAINE HCL 1 % IJ SOLN
INTRAMUSCULAR | Status: AC
  Filled 2020-10-01: qty 20

## 2020-10-01 MED ORDER — LIDOCAINE HCL 1 % IJ SOLN
INTRAMUSCULAR | Status: DC | PRN
  Administered 2020-10-01: 5 mL

## 2020-10-01 NOTE — Telephone Encounter (Signed)
[name], please let him know he CAN NOT take 2 of the Wellbutrin, it would be 600 mg and that's too much. When did the anxiety get this bad? I see he was started on Abilify.  Has that helped in any way? Why was the gabapentin stopped? And why on the Klonopin, other than fall risk? He's been on it for years so may be having a hard time b/c off it. He missed his appt yesterday, please have him r/s.

## 2020-10-01 NOTE — Procedures (Signed)
PROCEDURE SUMMARY:  Successful removal of right IJ tunneled CVC, removed intact. No immediate complications.  EBL < 1 mL. Patient tolerated well.  Pressure dressing (gauze + tegaderm) applied to site.  Discharge instructions: 1- Ok to shower 24 hours post-removal. 2- No submerging (swimming, bathing) for 7 days post-removal. 3- Ok to remove bandage after first shower, no further dressing changes needed- ensure area remains clean and dry until fully healed.  Please see imaging section of Epic for full dictation.   Earley Abide PA-C 10/01/2020 2:59 PM

## 2020-10-01 NOTE — Telephone Encounter (Signed)
Left message to call back  

## 2020-10-07 ENCOUNTER — Other Ambulatory Visit: Payer: Self-pay | Admitting: Cardiology

## 2020-10-08 ENCOUNTER — Telehealth: Payer: Self-pay

## 2020-10-08 ENCOUNTER — Ambulatory Visit: Payer: Medicare Other | Admitting: Infectious Disease

## 2020-10-08 NOTE — Telephone Encounter (Signed)
Attempted to reach patient to offer evisit as he is late for his 215 appointment. No answer, left voicemail to return call to reschedule. Eugenia Mcalpine

## 2020-10-09 ENCOUNTER — Telehealth: Payer: Self-pay

## 2020-10-09 NOTE — Telephone Encounter (Signed)
Patient missed visit on 2/23. Received call from [place] and states patient is not taking his amoxicillin due to having diarrhea. Spoke with patient and he states he stopped taking it shortly after he picked it up and did not call [facility] to report diarrhea. Patient accepts phone visit on 2/25. Eugenia Mcalpine

## 2020-10-10 ENCOUNTER — Encounter: Payer: Self-pay | Admitting: Infectious Disease

## 2020-10-10 ENCOUNTER — Telehealth (INDEPENDENT_AMBULATORY_CARE_PROVIDER_SITE_OTHER): Payer: No Typology Code available for payment source | Admitting: Infectious Disease

## 2020-10-10 ENCOUNTER — Other Ambulatory Visit: Payer: Self-pay

## 2020-10-10 DIAGNOSIS — A401 Sepsis due to streptococcus, group B: Secondary | ICD-10-CM | POA: Diagnosis not present

## 2020-10-10 DIAGNOSIS — R6251 Failure to thrive (child): Secondary | ICD-10-CM

## 2020-10-10 DIAGNOSIS — R6521 Severe sepsis with septic shock: Secondary | ICD-10-CM

## 2020-10-10 DIAGNOSIS — M00212 Other streptococcal arthritis, left shoulder: Secondary | ICD-10-CM

## 2020-10-10 DIAGNOSIS — M86221 Subacute osteomyelitis, right humerus: Secondary | ICD-10-CM

## 2020-10-10 DIAGNOSIS — R627 Adult failure to thrive: Secondary | ICD-10-CM

## 2020-10-10 DIAGNOSIS — N179 Acute kidney failure, unspecified: Secondary | ICD-10-CM

## 2020-10-10 DIAGNOSIS — A498 Other bacterial infections of unspecified site: Secondary | ICD-10-CM | POA: Insufficient documentation

## 2020-10-10 DIAGNOSIS — M00219 Other streptococcal arthritis, unspecified shoulder: Secondary | ICD-10-CM

## 2020-10-10 DIAGNOSIS — A419 Sepsis, unspecified organism: Secondary | ICD-10-CM

## 2020-10-10 HISTORY — DX: Other bacterial infections of unspecified site: A49.8

## 2020-10-10 MED ORDER — CEPHALEXIN 500 MG PO CAPS: 500.0000 mg | ORAL_CAPSULE | Freq: Two times a day (BID) | ORAL | 11 refills | Status: DC

## 2020-10-10 NOTE — Progress Notes (Signed)
Virtual Visit via Telephone Note  I connected with [name] on 10/10/20 at  3:30 PM EST by telephone and verified that I am speaking with the correct person using two identifiers.  Location: Patient: Home Provider: [facility]   I discussed the limitations, risks, security and privacy concerns of performing an evaluation and management service by telephone and the availability of in person appointments. I also discussed with the patient that there may be a patient responsible charge related to this service. The patient expressed understanding and agreed to proceed.   History of Present Illness:   75 y.o.malewith Group B streptococcal bacteremia and septic shock due to left septic shoulder sp I and D.  His right shoulder  Was also infected. He then grew Pseudomonas growing from an aspirate.   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 He is now status post surgery to the right side and repeat aspirate of the left which revealed hematoma.  He has been on cefepime after the Pseudomonas was recovered.  He has been on antibiotics since November 24 when he was admitted with sepsis.  He was sent home on cefepime.  In the interim he was seen by Dr. Onnie [name] who was concerned about his appearance and felt he was not well enough to go home and sent him to the emergency department.  In the interim he had aspirated both shoulders and sent the patient with the shoulder aspirate sent to the emergency department.  Aspirate the 1 shoulder revealed 38,200 white blood cells with 97% PMNs aspirate the other shoulder revealed 4 51,000 white blood cells with 99% neutrophils.  There were no crystals Gram stain was negative and the cultures were negative--KEEP IN MIND he has been on ACTIVE antbiotics for weeks.  Dr. Stann [name] was called about the patient when he was in the ER and reviewed his laboratory findings and felt that there was no  orthopedic intervention but that the patient might need further medical optimization in the hospital.  Ultimately patient was discharged him home from the ER.  I was concerned that he in fact DOES need further intervention on both shoulders.  He presented to clinic for follow-up in my clinic and his renal function had worsened considerably having had his creatinine jumped from 1 to above 2.  I arrange for direct admission to the hospital but because the hospital was so full he was not admitted for an additional 2 days.  When he was admitted he was found to have much worsening renal failure with a creatinine to up to 4.  During his admission he required hemodialysis due to him having encephalopathy and then also hypoxemia from fluid overload but eventually this was stopped and he was felt not to need it anymore.  He was evaluated by orthopedic surgery and Dr. Onnie [name] not feel there was a need for further surgical intervention.   My partner Dr. Linus [name] saw him as well and we continued him on IV cefepime.  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  Ultimately was discharged home 4 days prior to his last visit with me which shows on 26 January.  At that visit he had multiple complaints as well and blurry vision and weakness.  Fortunately his renal function was relatively stable when I checked his labs from home health.  I try to get  him admitted that day but he did not want to be admitted prefer to go home and be monitored by his aide.  In the interim I have prescribed him amoxicillin 3 times daily but he stopped this after 4 days because it was causing him to have an upset stomach.  He did not call me to tell me itself the medication I was unaware that he was off antibiotics altogether.  He finally had a central line removed recently.  He was too weak to make it to clinic.  He is starting to feel weaker and weaker and  says that he feels similar to how he did before the onset of his severe infection.  I would like him to start Keflex 500 mg twice daily to target the group B streptococcal infection that he has.  Concerned about his symptoms and whether or not he needs to be admitted.  He has seen primary care he checked his renal function twice and saw some improvement on second check with creatinine having come down to 1.67.  Patient continues to have significant shoulder pain and limited range of motion motion he also has low back pain and difficulty moving around having to use his knuckles to help him move from position to position.    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  Group B streptococcus was the major pathogen isolated from cultures and also from blood.  He did have Pseudomonas isolated from 1 aspirate in the right shoulder.  He had been on cefepime and we try to transition him to amoxicillin but he stopped it.  He is to start Keflex to target the group B streptococcus.  I do not want to put him on a fluoroquinolone and my suspicion is the Pseudomonas was a lesser organism in the joint space and potentially even a contaminant.  The group B strep was repeatedly founded and was found also in his blood  Acute renal failure this seems by labs to improve with primary care but I am wondering about how he is doing now with his weakness and continued failure to thrive.  As mentioned above I would have very low threshold to have admitted to the hospital.    Follow Up Instructions:    I discussed the assessment and treatment plan with the patient. The patient was provided an opportunity to ask questions and all were answered. The patient agreed with the plan and  demonstrated an understanding of the instructions.   The patient was advised to call back or seek an in-person evaluation if the symptoms worsen or if the condition fails to improve as anticipated.  I provided 21 minutes of non-face-to-face time during this encounter.   Alcide Evener, MD

## 2020-10-20 ENCOUNTER — Other Ambulatory Visit: Payer: Self-pay | Admitting: Physician Assistant

## 2020-10-27 ENCOUNTER — Telehealth (HOSPITAL_COMMUNITY): Payer: Self-pay | Admitting: *Deleted

## 2020-10-27 NOTE — Telephone Encounter (Signed)
Referral received from [facility]. Per Adline Potter patient is not a candidate for Advanced heart failure clinic and needs to follow up with general cardiology. Referral faxed back to [phone]

## 2020-10-29 ENCOUNTER — Other Ambulatory Visit: Payer: Self-pay | Admitting: Nephrology

## 2020-10-29 DIAGNOSIS — N179 Acute kidney failure, unspecified: Secondary | ICD-10-CM

## 2020-11-05 ENCOUNTER — Other Ambulatory Visit: Payer: Self-pay | Admitting: Internal Medicine

## 2020-11-07 ENCOUNTER — Encounter (HOSPITAL_COMMUNITY): Payer: Self-pay | Admitting: *Deleted

## 2020-11-07 ENCOUNTER — Inpatient Hospital Stay (HOSPITAL_COMMUNITY)
Admission: EM | Admit: 2020-11-07 | Discharge: 2020-11-14 | DRG: 193 | Disposition: E | Payer: Medicare Other | Attending: Internal Medicine | Admitting: Internal Medicine

## 2020-11-07 ENCOUNTER — Other Ambulatory Visit: Payer: Self-pay

## 2020-11-07 ENCOUNTER — Inpatient Hospital Stay (HOSPITAL_COMMUNITY): Payer: Medicare Other

## 2020-11-07 ENCOUNTER — Emergency Department (HOSPITAL_COMMUNITY): Payer: Medicare Other

## 2020-11-07 DIAGNOSIS — M19012 Primary osteoarthritis, left shoulder: Secondary | ICD-10-CM | POA: Diagnosis present

## 2020-11-07 DIAGNOSIS — Z888 Allergy status to other drugs, medicaments and biological substances status: Secondary | ICD-10-CM

## 2020-11-07 DIAGNOSIS — E1169 Type 2 diabetes mellitus with other specified complication: Secondary | ICD-10-CM | POA: Diagnosis not present

## 2020-11-07 DIAGNOSIS — Z951 Presence of aortocoronary bypass graft: Secondary | ICD-10-CM

## 2020-11-07 DIAGNOSIS — K761 Chronic passive congestion of liver: Secondary | ICD-10-CM | POA: Diagnosis present

## 2020-11-07 DIAGNOSIS — E1151 Type 2 diabetes mellitus with diabetic peripheral angiopathy without gangrene: Secondary | ICD-10-CM | POA: Diagnosis present

## 2020-11-07 DIAGNOSIS — K219 Gastro-esophageal reflux disease without esophagitis: Secondary | ICD-10-CM

## 2020-11-07 DIAGNOSIS — Z792 Long term (current) use of antibiotics: Secondary | ICD-10-CM

## 2020-11-07 DIAGNOSIS — Z833 Family history of diabetes mellitus: Secondary | ICD-10-CM

## 2020-11-07 DIAGNOSIS — I5043 Acute on chronic combined systolic (congestive) and diastolic (congestive) heart failure: Secondary | ICD-10-CM | POA: Diagnosis present

## 2020-11-07 DIAGNOSIS — Z832 Family history of diseases of the blood and blood-forming organs and certain disorders involving the immune mechanism: Secondary | ICD-10-CM

## 2020-11-07 DIAGNOSIS — F411 Generalized anxiety disorder: Secondary | ICD-10-CM | POA: Diagnosis present

## 2020-11-07 DIAGNOSIS — G9341 Metabolic encephalopathy: Secondary | ICD-10-CM | POA: Diagnosis present

## 2020-11-07 DIAGNOSIS — E782 Mixed hyperlipidemia: Secondary | ICD-10-CM

## 2020-11-07 DIAGNOSIS — Z808 Family history of malignant neoplasm of other organs or systems: Secondary | ICD-10-CM

## 2020-11-07 DIAGNOSIS — M19011 Primary osteoarthritis, right shoulder: Secondary | ICD-10-CM | POA: Diagnosis present

## 2020-11-07 DIAGNOSIS — N400 Enlarged prostate without lower urinary tract symptoms: Secondary | ICD-10-CM | POA: Diagnosis present

## 2020-11-07 DIAGNOSIS — J441 Chronic obstructive pulmonary disease with (acute) exacerbation: Secondary | ICD-10-CM | POA: Diagnosis present

## 2020-11-07 DIAGNOSIS — W19XXXA Unspecified fall, initial encounter: Secondary | ICD-10-CM | POA: Diagnosis present

## 2020-11-07 DIAGNOSIS — M009 Pyogenic arthritis, unspecified: Secondary | ICD-10-CM | POA: Diagnosis present

## 2020-11-07 DIAGNOSIS — Z886 Allergy status to analgesic agent status: Secondary | ICD-10-CM

## 2020-11-07 DIAGNOSIS — J189 Pneumonia, unspecified organism: Secondary | ICD-10-CM | POA: Diagnosis present

## 2020-11-07 DIAGNOSIS — F32A Depression, unspecified: Secondary | ICD-10-CM | POA: Diagnosis present

## 2020-11-07 DIAGNOSIS — Z7982 Long term (current) use of aspirin: Secondary | ICD-10-CM

## 2020-11-07 DIAGNOSIS — I469 Cardiac arrest, cause unspecified: Secondary | ICD-10-CM | POA: Diagnosis not present

## 2020-11-07 DIAGNOSIS — Z7189 Other specified counseling: Secondary | ICD-10-CM

## 2020-11-07 DIAGNOSIS — Z818 Family history of other mental and behavioral disorders: Secondary | ICD-10-CM

## 2020-11-07 DIAGNOSIS — K920 Hematemesis: Secondary | ICD-10-CM | POA: Diagnosis not present

## 2020-11-07 DIAGNOSIS — D649 Anemia, unspecified: Secondary | ICD-10-CM | POA: Diagnosis present

## 2020-11-07 DIAGNOSIS — S0083XA Contusion of other part of head, initial encounter: Secondary | ICD-10-CM | POA: Diagnosis present

## 2020-11-07 DIAGNOSIS — Z20822 Contact with and (suspected) exposure to covid-19: Secondary | ICD-10-CM | POA: Diagnosis present

## 2020-11-07 DIAGNOSIS — J440 Chronic obstructive pulmonary disease with acute lower respiratory infection: Secondary | ICD-10-CM | POA: Diagnosis present

## 2020-11-07 DIAGNOSIS — E785 Hyperlipidemia, unspecified: Secondary | ICD-10-CM | POA: Diagnosis present

## 2020-11-07 DIAGNOSIS — Z8262 Family history of osteoporosis: Secondary | ICD-10-CM

## 2020-11-07 DIAGNOSIS — I5021 Acute systolic (congestive) heart failure: Secondary | ICD-10-CM | POA: Diagnosis not present

## 2020-11-07 DIAGNOSIS — I081 Rheumatic disorders of both mitral and tricuspid valves: Secondary | ICD-10-CM | POA: Diagnosis present

## 2020-11-07 DIAGNOSIS — G894 Chronic pain syndrome: Secondary | ICD-10-CM | POA: Diagnosis present

## 2020-11-07 DIAGNOSIS — H5462 Unqualified visual loss, left eye, normal vision right eye: Secondary | ICD-10-CM | POA: Diagnosis present

## 2020-11-07 DIAGNOSIS — F419 Anxiety disorder, unspecified: Secondary | ICD-10-CM | POA: Diagnosis present

## 2020-11-07 DIAGNOSIS — R000 Tachycardia, unspecified: Secondary | ICD-10-CM | POA: Diagnosis present

## 2020-11-07 DIAGNOSIS — Z87891 Personal history of nicotine dependence: Secondary | ICD-10-CM

## 2020-11-07 DIAGNOSIS — Z882 Allergy status to sulfonamides status: Secondary | ICD-10-CM

## 2020-11-07 DIAGNOSIS — I248 Other forms of acute ischemic heart disease: Secondary | ICD-10-CM | POA: Diagnosis present

## 2020-11-07 DIAGNOSIS — Z66 Do not resuscitate: Secondary | ICD-10-CM | POA: Diagnosis present

## 2020-11-07 DIAGNOSIS — Z91041 Radiographic dye allergy status: Secondary | ICD-10-CM

## 2020-11-07 DIAGNOSIS — R7989 Other specified abnormal findings of blood chemistry: Secondary | ICD-10-CM | POA: Diagnosis present

## 2020-11-07 DIAGNOSIS — Z794 Long term (current) use of insulin: Secondary | ICD-10-CM

## 2020-11-07 DIAGNOSIS — N179 Acute kidney failure, unspecified: Secondary | ICD-10-CM | POA: Diagnosis present

## 2020-11-07 DIAGNOSIS — E1142 Type 2 diabetes mellitus with diabetic polyneuropathy: Secondary | ICD-10-CM | POA: Diagnosis present

## 2020-11-07 DIAGNOSIS — R778 Other specified abnormalities of plasma proteins: Secondary | ICD-10-CM | POA: Diagnosis not present

## 2020-11-07 DIAGNOSIS — Z79899 Other long term (current) drug therapy: Secondary | ICD-10-CM

## 2020-11-07 DIAGNOSIS — I2510 Atherosclerotic heart disease of native coronary artery without angina pectoris: Secondary | ICD-10-CM

## 2020-11-07 DIAGNOSIS — J9601 Acute respiratory failure with hypoxia: Secondary | ICD-10-CM | POA: Diagnosis present

## 2020-11-07 DIAGNOSIS — I10 Essential (primary) hypertension: Secondary | ICD-10-CM | POA: Diagnosis not present

## 2020-11-07 DIAGNOSIS — I110 Hypertensive heart disease with heart failure: Secondary | ICD-10-CM | POA: Diagnosis present

## 2020-11-07 DIAGNOSIS — B951 Streptococcus, group B, as the cause of diseases classified elsewhere: Secondary | ICD-10-CM | POA: Diagnosis present

## 2020-11-07 DIAGNOSIS — M810 Age-related osteoporosis without current pathological fracture: Secondary | ICD-10-CM | POA: Diagnosis present

## 2020-11-07 DIAGNOSIS — Z515 Encounter for palliative care: Secondary | ICD-10-CM

## 2020-11-07 DIAGNOSIS — Z8249 Family history of ischemic heart disease and other diseases of the circulatory system: Secondary | ICD-10-CM

## 2020-11-07 DIAGNOSIS — Z801 Family history of malignant neoplasm of trachea, bronchus and lung: Secondary | ICD-10-CM

## 2020-11-07 DIAGNOSIS — Z638 Other specified problems related to primary support group: Secondary | ICD-10-CM

## 2020-11-07 DIAGNOSIS — I252 Old myocardial infarction: Secondary | ICD-10-CM

## 2020-11-07 DIAGNOSIS — Z8673 Personal history of transient ischemic attack (TIA), and cerebral infarction without residual deficits: Secondary | ICD-10-CM

## 2020-11-07 LAB — I-STAT CHEM 8, ED
BUN: 20 mg/dL (ref 8–23)
Calcium, Ion: 1.12 mmol/L — ABNORMAL LOW (ref 1.15–1.40)
Chloride: 100 mmol/L (ref 98–111)
Creatinine, Ser: 0.8 mg/dL (ref 0.61–1.24)
Glucose, Bld: 185 mg/dL — ABNORMAL HIGH (ref 70–99)
HCT: 35 % — ABNORMAL LOW (ref 39.0–52.0)
Hemoglobin: 11.9 g/dL — ABNORMAL LOW (ref 13.0–17.0)
Potassium: 3.7 mmol/L (ref 3.5–5.1)
Sodium: 136 mmol/L (ref 135–145)
TCO2: 24 mmol/L (ref 22–32)

## 2020-11-07 LAB — I-STAT ARTERIAL BLOOD GAS, ED
Acid-base deficit: 2 mmol/L (ref 0.0–2.0)
Bicarbonate: 22.3 mmol/L (ref 20.0–28.0)
Calcium, Ion: 1.22 mmol/L (ref 1.15–1.40)
HCT: 33 % — ABNORMAL LOW (ref 39.0–52.0)
Hemoglobin: 11.2 g/dL — ABNORMAL LOW (ref 13.0–17.0)
O2 Saturation: 94 %
Patient temperature: 97.4
Potassium: 4 mmol/L (ref 3.5–5.1)
Sodium: 134 mmol/L — ABNORMAL LOW (ref 135–145)
TCO2: 23 mmol/L (ref 22–32)
pCO2 arterial: 33.7 mmHg (ref 32.0–48.0)
pH, Arterial: 7.425 (ref 7.350–7.450)
pO2, Arterial: 65 mmHg — ABNORMAL LOW (ref 83.0–108.0)

## 2020-11-07 LAB — CBG MONITORING, ED: Glucose-Capillary: 154 mg/dL — ABNORMAL HIGH (ref 70–99)

## 2020-11-07 LAB — BRAIN NATRIURETIC PEPTIDE: B Natriuretic Peptide: 2212.5 pg/mL — ABNORMAL HIGH (ref 0.0–100.0)

## 2020-11-07 LAB — RESP PANEL BY RT-PCR (FLU A&B, COVID) ARPGX2
Influenza A by PCR: NEGATIVE
Influenza B by PCR: NEGATIVE
SARS Coronavirus 2 by RT PCR: NEGATIVE

## 2020-11-07 LAB — COMPREHENSIVE METABOLIC PANEL
ALT: 116 U/L — ABNORMAL HIGH (ref 0–44)
AST: 30 U/L (ref 15–41)
Albumin: 2.5 g/dL — ABNORMAL LOW (ref 3.5–5.0)
Alkaline Phosphatase: 182 U/L — ABNORMAL HIGH (ref 38–126)
Anion gap: 11 (ref 5–15)
BUN: 18 mg/dL (ref 8–23)
CO2: 22 mmol/L (ref 22–32)
Calcium: 8.8 mg/dL — ABNORMAL LOW (ref 8.9–10.3)
Chloride: 101 mmol/L (ref 98–111)
Creatinine, Ser: 0.89 mg/dL (ref 0.61–1.24)
GFR, Estimated: >60 mL/min (ref >60)
Glucose, Bld: 182 mg/dL — ABNORMAL HIGH (ref 70–99)
Potassium: 3.8 mmol/L (ref 3.5–5.1)
Sodium: 134 mmol/L — ABNORMAL LOW (ref 135–145)
Total Bilirubin: 1.1 mg/dL (ref 0.3–1.2)
Total Protein: 6.9 g/dL (ref 6.5–8.1)

## 2020-11-07 LAB — HIV ANTIBODY (ROUTINE TESTING W REFLEX): HIV Screen 4th Generation wRfx: NONREACTIVE

## 2020-11-07 LAB — URINALYSIS, ROUTINE W REFLEX MICROSCOPIC
Bacteria, UA: NONE SEEN
Bilirubin Urine: NEGATIVE
Glucose, UA: NEGATIVE mg/dL
Hgb urine dipstick: NEGATIVE
Ketones, ur: NEGATIVE mg/dL
Leukocytes,Ua: NEGATIVE
Nitrite: NEGATIVE
Protein, ur: 100 mg/dL — AB
Specific Gravity, Urine: 1.018 (ref 1.005–1.030)
pH: 5 (ref 5.0–8.0)

## 2020-11-07 LAB — C-REACTIVE PROTEIN: CRP: 15 mg/dL — ABNORMAL HIGH (ref <1.0)

## 2020-11-07 LAB — CBC WITH DIFFERENTIAL/PLATELET
Abs Immature Granulocytes: 0.09 10*3/uL — ABNORMAL HIGH (ref 0.00–0.07)
Basophils Absolute: 0 10*3/uL (ref 0.0–0.1)
Basophils Relative: 0 %
Eosinophils Absolute: 0.1 10*3/uL (ref 0.0–0.5)
Eosinophils Relative: 1 %
HCT: 34 % — ABNORMAL LOW (ref 39.0–52.0)
Hemoglobin: 10.5 g/dL — ABNORMAL LOW (ref 13.0–17.0)
Immature Granulocytes: 1 %
Lymphocytes Relative: 9 %
Lymphs Abs: 0.8 10*3/uL (ref 0.7–4.0)
MCH: 29 pg (ref 26.0–34.0)
MCHC: 30.9 g/dL (ref 30.0–36.0)
MCV: 93.9 fL (ref 80.0–100.0)
Monocytes Absolute: 1.3 10*3/uL — ABNORMAL HIGH (ref 0.1–1.0)
Monocytes Relative: 14 %
Neutro Abs: 6.7 10*3/uL (ref 1.7–7.7)
Neutrophils Relative %: 75 %
Platelets: 369 10*3/uL (ref 150–400)
RBC: 3.62 MIL/uL — ABNORMAL LOW (ref 4.22–5.81)
RDW: 17.4 % — ABNORMAL HIGH (ref 11.5–15.5)
WBC: 9 10*3/uL (ref 4.0–10.5)
nRBC: 0 % (ref 0.0–0.2)

## 2020-11-07 LAB — MAGNESIUM: Magnesium: 1.9 mg/dL (ref 1.7–2.4)

## 2020-11-07 LAB — TROPONIN I (HIGH SENSITIVITY)
Troponin I (High Sensitivity): 30 ng/L — ABNORMAL HIGH (ref <18)
Troponin I (High Sensitivity): 38 ng/L — ABNORMAL HIGH (ref <18)

## 2020-11-07 LAB — HEMOGLOBIN A1C
Hgb A1c MFr Bld: 7.6 % — ABNORMAL HIGH (ref 4.8–5.6)
Mean Plasma Glucose: 171.42 mg/dL

## 2020-11-07 LAB — LACTIC ACID, PLASMA
Lactic Acid, Venous: 2.6 mmol/L (ref 0.5–1.9)
Lactic Acid, Venous: 2.5 mmol/L (ref 0.5–1.9)

## 2020-11-07 LAB — TYPE AND SCREEN
ABO/RH(D): A NEG
Antibody Screen: NEGATIVE

## 2020-11-07 MED ORDER — SODIUM CHLORIDE 0.9 % IV SOLN: 250.0000 mL | INTRAVENOUS | Status: DC | PRN

## 2020-11-07 MED ORDER — PANTOPRAZOLE SODIUM 40 MG PO TBEC
40.0000 mg | DELAYED_RELEASE_TABLET | Freq: Two times a day (BID) | ORAL | Status: DC
  Administered 2020-11-08: 40 mg via ORAL
  Filled 2020-11-07: qty 1

## 2020-11-07 MED ORDER — GABAPENTIN 100 MG PO CAPS
100.0000 mg | ORAL_CAPSULE | Freq: Two times a day (BID) | ORAL | Status: DC
  Administered 2020-11-08: 100 mg via ORAL
  Filled 2020-11-07: qty 1

## 2020-11-07 MED ORDER — ALBUTEROL SULFATE HFA 108 (90 BASE) MCG/ACT IN AERS
2.0000 | INHALATION_SPRAY | Freq: Four times a day (QID) | RESPIRATORY_TRACT | Status: DC | PRN
  Filled 2020-11-07: qty 6.7

## 2020-11-07 MED ORDER — TICAGRELOR 90 MG PO TABS
90.0000 mg | ORAL_TABLET | Freq: Two times a day (BID) | ORAL | Status: DC
Start: 2020-11-07 — End: 2020-11-10
  Administered 2020-11-08 – 2020-11-09 (×2): 90 mg via ORAL
  Filled 2020-11-07 (×2): qty 1

## 2020-11-07 MED ORDER — ROPINIROLE HCL 1 MG PO TABS
1.0000 mg | ORAL_TABLET | Freq: Two times a day (BID) | ORAL | Status: DC
  Administered 2020-11-08 – 2020-11-09 (×2): 1 mg via ORAL
  Filled 2020-11-07 (×2): qty 1

## 2020-11-07 MED ORDER — LACTATED RINGERS IV SOLN: INTRAVENOUS | Status: DC

## 2020-11-07 MED ORDER — INSULIN ASPART 100 UNIT/ML ~~LOC~~ SOLN
0.0000 [IU] | Freq: Three times a day (TID) | SUBCUTANEOUS | Status: DC
  Administered 2020-11-08: 2 [IU] via SUBCUTANEOUS
  Administered 2020-11-08: 5 [IU] via SUBCUTANEOUS
  Administered 2020-11-08: 2 [IU] via SUBCUTANEOUS
  Administered 2020-11-09: 8 [IU] via SUBCUTANEOUS
  Administered 2020-11-09: 3 [IU] via SUBCUTANEOUS
  Administered 2020-11-09: 8 [IU] via SUBCUTANEOUS

## 2020-11-07 MED ORDER — FUROSEMIDE 10 MG/ML IJ SOLN
40.0000 mg | Freq: Two times a day (BID) | INTRAMUSCULAR | Status: DC
  Administered 2020-11-07 – 2020-11-08 (×3): 40 mg via INTRAVENOUS
  Filled 2020-11-07 (×3): qty 4

## 2020-11-07 MED ORDER — ISOSORBIDE MONONITRATE ER 30 MG PO TB24
60.0000 mg | ORAL_TABLET | Freq: Every day | ORAL | Status: DC
  Administered 2020-11-08 – 2020-11-09 (×2): 60 mg via ORAL
  Filled 2020-11-07 (×2): qty 2

## 2020-11-07 MED ORDER — NITROGLYCERIN 0.4 MG SL SUBL
0.4000 mg | SUBLINGUAL_TABLET | SUBLINGUAL | Status: DC | PRN
  Filled 2020-11-07: qty 1

## 2020-11-07 MED ORDER — ASPIRIN EC 81 MG PO TBEC
81.0000 mg | DELAYED_RELEASE_TABLET | Freq: Every day | ORAL | Status: DC
  Administered 2020-11-08: 81 mg via ORAL
  Filled 2020-11-07: qty 1

## 2020-11-07 MED ORDER — SODIUM CHLORIDE 0.9 % IV SOLN
2.0000 g | INTRAVENOUS | Status: DC
  Administered 2020-11-07 – 2020-11-09 (×3): 2 g via INTRAVENOUS
  Filled 2020-11-07: qty 20
  Filled 2020-11-07 (×2): qty 2

## 2020-11-07 MED ORDER — BUPROPION HCL ER (XL) 150 MG PO TB24
300.0000 mg | ORAL_TABLET | Freq: Every day | ORAL | Status: DC
  Administered 2020-11-08 – 2020-11-09 (×2): 300 mg via ORAL
  Filled 2020-11-07 (×2): qty 2

## 2020-11-07 MED ORDER — SODIUM CHLORIDE 0.9 % IV SOLN
500.0000 mg | INTRAVENOUS | Status: DC
  Administered 2020-11-07 – 2020-11-09 (×3): 500 mg via INTRAVENOUS
  Filled 2020-11-07 (×3): qty 500

## 2020-11-07 MED ORDER — ENOXAPARIN SODIUM 40 MG/0.4ML ~~LOC~~ SOLN
40.0000 mg | SUBCUTANEOUS | Status: DC
  Administered 2020-11-07: 40 mg via SUBCUTANEOUS
  Filled 2020-11-07: qty 0.4

## 2020-11-07 MED ORDER — ALBUTEROL SULFATE (2.5 MG/3ML) 0.083% IN NEBU
2.5000 mg | INHALATION_SOLUTION | RESPIRATORY_TRACT | Status: DC | PRN
  Administered 2020-11-08: 2.5 mg via RESPIRATORY_TRACT
  Filled 2020-11-07: qty 3

## 2020-11-07 MED ORDER — SODIUM CHLORIDE 0.9% FLUSH: 3.0000 mL | INTRAVENOUS | Status: DC | PRN

## 2020-11-07 MED ORDER — ACETAMINOPHEN 325 MG PO TABS: 650.0000 mg | ORAL_TABLET | ORAL | Status: DC | PRN

## 2020-11-07 MED ORDER — CARVEDILOL 3.125 MG PO TABS
12.5000 mg | ORAL_TABLET | Freq: Two times a day (BID) | ORAL | Status: DC
  Administered 2020-11-08 – 2020-11-09 (×3): 12.5 mg via ORAL
  Filled 2020-11-07 (×3): qty 4

## 2020-11-07 MED ORDER — ARIPIPRAZOLE 5 MG PO TABS
5.0000 mg | ORAL_TABLET | Freq: Every day | ORAL | Status: DC
  Administered 2020-11-08 – 2020-11-09 (×2): 5 mg via ORAL
  Filled 2020-11-07 (×2): qty 1

## 2020-11-07 MED ORDER — SODIUM CHLORIDE 0.9% FLUSH
3.0000 mL | Freq: Two times a day (BID) | INTRAVENOUS | Status: DC
Start: 2020-11-07 — End: 2020-11-07

## 2020-11-07 MED ORDER — ONDANSETRON HCL 4 MG/2ML IJ SOLN
4.0000 mg | Freq: Four times a day (QID) | INTRAMUSCULAR | Status: DC | PRN
  Administered 2020-11-07 – 2020-11-08 (×3): 4 mg via INTRAVENOUS
  Filled 2020-11-07 (×3): qty 2

## 2020-11-07 MED ORDER — TAMSULOSIN HCL 0.4 MG PO CAPS
0.4000 mg | ORAL_CAPSULE | Freq: Two times a day (BID) | ORAL | Status: DC
  Administered 2020-11-08 – 2020-11-09 (×2): 0.4 mg via ORAL
  Filled 2020-11-07 (×2): qty 1

## 2020-11-07 NOTE — Progress Notes (Signed)
Upon receiving patient, noted that sats were 66% on 6L nasal cannula and patient was dusky.  Immediately increased oxygen to 15L and added NRB mask at 10L.  Patient slow to recover.  Able to wean oxygen back down to 4L nasal cannula with a sat of 97-100%, MD aware of incident and notified of MEWS of 5.  No new orders received.  ECG performed for chest pain and placed in the chart. VS documented

## 2020-11-07 NOTE — Progress Notes (Signed)
Date and time results received: 10/21/2020 2255  Test: Lactic Acid  Critical Value: 2.6  Name of Provider Notified: [facility]

## 2020-11-07 NOTE — ED Notes (Addendum)
Dr. Cyd Silence at bedside. Would like pt to get a CT before going upstairs

## 2020-11-07 NOTE — ED Notes (Signed)
This RN called report to 2W - This RN will take pt upstairs after CT scan

## 2020-11-07 NOTE — H&P (Addendum)
History and Physical    [name] OMV:[id number] DOB: 1945-10-11 DOA: 10/28/2020  PCP: Doreatha Lew, MD  Patient coming from: Home via EMS   Chief Complaint:  Chief Complaint  Patient presents with  . Fall     HPI:    75 year old male with past medical history of systolic and diastolic congestive heart failure, diabetes mellitus type 2, diabetic polyneuropathy, peripheral artery disease (S/P multiple interventions), coronary artery disease (S/P CABG, severe multivessel disease),gastroesophageal reflux disease, hyperlipidemia, generalized anxiety disorder who presents to [facility] emergency department after being found down in his apartment by his neighbor.  Of note, patient has had several prolonged hospitalizations at [facility] in the past several months.  Patient was admitted to [facility] from 11/24 until 12/17 for septic arthritis of both shoulders with group B strep and Pseudomonas.  Presentation complicated by septic shock, osteomyelitis of the right humeral head.  After prolonged hospitalization patient was eventually discharged home on intravenous cefepime via PICC line which was initially to be given until 1/28.  Patient unfortunately was found to be developing severe acute kidney injury and was readmitted to [facility] on 1/7.  Hospital stay patient acute kidney injury was quite severe requiring temporarily hemodialysis.  Hospital course was also complicated by encephalopathy.  Patient was eventually transitioned to intravenous Zosyn.  Patient's mentation improved when patient was taken off of hemodialysis.  Patient was discharged on 1/22 and was to continue intravenous Zosyn until 1/28.  Patient is continue to follow-up as an outpatient with infectious disease during the patient's last evaluation on 2/25 there was concern that patient was developing persisting group B strep infection and therefore patient was placed on Keflex 500 mg by  mouth twice daily.  Patient reports that he has been taking all medications as directed since he was last evaluated.  Patient did transiently feel better with improvement of his weakness with taking Keflex as directed.  Patient denies any fever.  Patient reports that in the past 2 weeks he has been to develop shortness of breath.  Initial shortness of breath was relatively mild within the days that followed shortness of breath has become more and more severe.  Patient denies sick contacts, recent travel or contact with confirmed COVID-19 infection.  As patient's shortness of breath has progressively worsened he has developed concurrent bilateral lower extremity edema, increasing abdominal girth, paroxysmal nocturnal dyspnea and pillow orthopnea.  Patient is also complaining of intermittent productive cough with dark-colored sputum.  Patient also complains of intermittent chest pressure although he is unable to provide further details concerning this.  Patient states that yesterday evening, he tried to get up out of bed but was so weak that he fell to the ground and has been lying on the ground all evening long.  Patient was found down in his place of residence by his neighbor this morning and EMS was contacted.  EMS promptly came to evaluate the patient finding the patient was initially hypotensive with blood pressure of 80/40 and hypoxic with at 80% on room air.  500 cc bolus of normal saline was given to the patient and patient was promptly brought to [facility] emergency department for evaluation.  Upon evaluation in the emergency department patient found to have extensive bilateral traits on chest imaging.  Patient is also been found to be quite hypoxic.  Patient initially on a nonrebreather mask but is currently been titrated to 5 L via nasal cannula.  Due to multiple surgical injury and concern for underlying infection.  His ceftriaxone and azithromycin were administered.  There is also  clinical concern for acute congestive heart failure.  BNP was found to be 2212.  The hospitalist group was then called to assess the patient for admission to the hospital.  Review of Systems:   Review of Systems  Constitutional: Positive for malaise/fatigue.  Respiratory: Positive for cough, sputum production and shortness of breath.   Cardiovascular: Positive for chest pain, orthopnea, leg swelling and PND.  Musculoskeletal: Positive for falls and joint pain.  Neurological: Positive for weakness.  All other systems reviewed and are negative.   Past Medical History:  Diagnosis Date  . Acute renal failure (ARF) ([place]) 08/21/2020  . Anemia    NOS  . Anxiety   . 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 Blurry vision 09/10/2020  . BPH (benign prostatic hypertrophy)   . Broken foot Oct. 12, 2013   Right foot Fx  . CAD (coronary artery disease)   . Carotid stenosis, left   . Cellulitis and abscess of leg 03/2018   right leg  . CHF (congestive heart failure) ([place])   . Chronic hyponatremia   . Chronic pain syndrome   . COPD (chronic obstructive pulmonary disease) ([place])   . Depression   . Diabetes mellitus   . Diabetic foot ulcer ([place])   . DJD (degenerative joint disease)    L wrist  . DM2 (diabetes mellitus, type 2) ([place])   . GERD (gastroesophageal reflux disease)   . HLD (hyperlipidemia)   . HTN (hypertension)   . Hx of blood transfusion reaction   . Hyponatremia   . Lumbar disc disease   . MI (mitral incompetence)   . Myocardial infarction ([place])    x 2  . Osteoporosis    tx per [facility], Dr Prudencio Burly, thought due to heavy steriod use after 1978  . Peripheral neuropathy   . Peripheral vascular disease ([place])   . Pseudomonas infection 10/10/2020  . Restless leg syndrome   . Spine fracture    hx, multiple  . Varicose veins   . 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 Vitamin D deficiency     Past Surgical History:  Procedure Laterality Date  .  ANGIOPLASTY    . aorta bifemoral bypass grafting  09/2010  . CARDIAC CATHETERIZATION N/A 09/19/2015   Procedure: Left Heart Cath and Coronary Angiography;  Surgeon: Adrian Prows, MD;  Location: [place] CV LAB;  Service: Cardiovascular;  Laterality: N/A;  . CARPAL TUNNEL RELEASE     right 2006/ left 2007  . CATARACT EXTRACTION     left 1996/ right 1997  . CORONARY ARTERY BYPASS GRAFT N/A 09/22/2015   Procedure: CORONARY ARTERY BYPASS GRAFTING (CABG) times four using the right greater saphenous vein harvested endoscopically and the left internal mammary artery.  LIMA-LAD, SEQ SVG-DIAG & OM, SVG-PD.;  Surgeon: [name], MD;  Location: [place];  Service: Open Heart Surgery;  Laterality: N/A;  . I & D EXTREMITY Right 04/07/2018   Procedure: IRRIGATION AND DEBRIDEMENT ABSCESS RIGHT LEG;  Surgeon: Newt Minion, MD;  Location: [place];  Service: Orthopedics;  Laterality: Right;  . INCISION AND DRAINAGE OF WOUND Right 05/07/2020   Procedure: RIGHT LONG FINGER IRRIGATION AND DEBRIDEMENT AND AMPUTATION;  Surgeon: Iran Planas, MD;  Location: [place];  Service: Orthopedics;  Laterality: Right;  with IV sedation  .  INCISION AND DRAINAGE OF WOUND Right 05/12/2020   Procedure: IRRIGATION AND DEBRIDEMENT WOUND and possible revision amputation;  Surgeon: Iran Planas, MD;  Location: [place];  Service: Orthopedics;  Laterality: Right;  needs 60 minutes  . IR FLUORO GUIDE CV LINE LEFT  08/25/2020  . IR FLUORO GUIDE CV LINE RIGHT  07/18/2020  . IR REMOVAL TUN CV CATH W/O FL  10/01/2020  . IR US GUIDE VASC ACCESS LEFT  08/25/2020  . IR US GUIDE VASC ACCESS RIGHT  07/18/2020  . L foot open repair jones fracture  2010    5th metetarsal   . left ankle ganglion cyst  1976  . left carotid endarterectomy  03/2011  . left cataract  1996   right - 1997  . left CTS  2006   R CTS - 2007  . left foot surgery  1998   R surgery/fracture - 1999  . left plantar ganglion cystectomy  1979  . left wrist/hand fusion  2008  .  [place], 2004  . NASAL SINUS SURGERY     multiple- x8. last 1997 with obliteration  . REPAIR THORACIC AORTA  2017  . right hand fracture  1969  . ROTATOR CUFF REPAIR  2006   R, than repeat 2011, Dr Theda Sers  . SHOULDER ARTHROSCOPY Bilateral 07/17/2020   Procedure: Right shoulder arthroscopic lavage; Left shoulder aspiration.;  Surgeon: Justice Britain, MD;  Location: [facility];  Service: Orthopedics;  Laterality: Bilateral;  174mn  . SHOULDER ARTHROSCOPY WITH LABRAL REPAIR Left 07/10/2020   Procedure: SHOULDER ARTHROSCOPY WITH WASHOUT;  Surgeon: SJustice Britain MD;  Location: [facility];  Service: Orthopedics;  Laterality: Left;  . TRIGGER FINGER RELEASE Left 11/11/2014   Procedure: LEFT LONG FINGER RELEASE TRIGGER FINGER/A-1 PULLEY;  Surgeon: DMilly Jakob MD;  [place]  Service: Orthopedics;  Laterality: Left;  . vocal surgery  1996     reports that he quit smoking about 32 years ago. His smoking use included cigarettes. He has a 45.00 pack-year smoking history. He has never used smokeless tobacco. He reports previous alcohol use. He reports that he does not use drugs.  Allergies  Allergen Reactions  . Ivp Dye [Iodinated Diagnostic Agents]     Blood Pressure dropped----pt was pre-medicated with 13 hour prep and did fine with pre-meds--amy 03/09/11   . Sulfa Antibiotics Hives  . Sulfonamide Derivatives Hives and Itching  . Lisinopril Cough  . Metoprolol Nausea And Vomiting  . Statins Other (See Comments)    myalgias  . Propoxyphene N-Acetaminophen Other (See Comments)    Sharp pains- headache    Family History  Problem Relation Age of Onset  . Lung cancer Father 536 . Diabetes Mother 718 . Osteoporosis Mother   . Depression Mother   . Bleeding Disorder Mother   . Throat cancer Other        family hx - "bad living" - also lung CA, heart disease and renal failure    . Diabetes Sister   . Suicidality Son 481 . Lung cancer Maternal  Grandmother 58 . Diabetes Maternal Grandfather 631 . Heart disease Maternal Grandfather   . Heart attack Paternal Grandmother   . Brain cancer Paternal Grandfather 755    Prior to Admission medications   Medication Sig Start Date End Date Taking? Authorizing Provider  acetaminophen (TYLENOL) 500 MG tablet Take 1,000 mg by mouth every 6 (six) hours as needed for mild pain, fever or  headache.   Yes [provider]  albuterol (VENTOLIN HFA) 108 (90 Base) MCG/ACT inhaler Inhale 2 puffs into the lungs every 6 (six) hours as needed for wheezing or shortness of breath.   Yes [provider]  ACCU-CHEK FASTCLIX LANCETS MISC Use as directed to check blood glucose. Dx: E11.51 01/17/18   [name], NP  ARIPiprazole (ABILIFY) 5 MG tablet Take 1 tablet (5 mg total) by mouth daily. 09/04/20   Barb Merino, MD  aspirin EC 81 MG tablet Take 1 tablet (81 mg total) by mouth daily. 07/15/20   Spero Geralds, MD  Blood Glucose Monitoring Suppl (ACCU-CHEK AVIVA PLUS) w/Device KIT Use as directed to check blood glucose daily. Dx: E11.51 01/17/18   [name], NP  BRILINTA 90 MG TABS tablet Take 90 mg by mouth 2 (two) times daily. 11/21/19   [provider]  buPROPion (WELLBUTRIN XL) 300 MG 24 hr tablet Take 1 tablet by mouth once daily 10/20/20   Donnal Moat T, PA-C  carvedilol (COREG) 12.5 MG tablet Take 1 tablet (12.5 mg total) by mouth 2 (two) times daily with a meal. 09/04/20   Barb Merino, MD  cephALEXin (KEFLEX) 500 MG capsule Take 1 capsule (500 mg total) by mouth 2 (two) times daily. 10/10/20   Truman Hayward, MD  Cholecalciferol (VITAMIN D3) 5000 units TABS Take 5,000 Units by mouth daily.    [provider]  clonazePAM (KLONOPIN) 0.5 MG tablet Take 1 tablet (0.5 mg total) by mouth 2 (two) times daily as needed for up to 7 days for anxiety. 09/04/20 09/11/20  Barb Merino, MD  gabapentin (NEURONTIN) 100 MG capsule Take 1 capsule (100 mg total) by  mouth 2 (two) times daily. 09/04/20   Barb Merino, MD  glucose blood (ACCU-CHEK AVIVA PLUS) test strip 1 each by Other route as needed for other. Use as instructed. Dx: E11.51 01/17/18   [name], NP  insulin aspart (NOVOLOG) 100 UNIT/ML injection Inject 0-9 Units into the skin 3 (three) times daily with meals. 09/04/20   Barb Merino, MD  IRON PO Take 1 capsule by mouth daily.    [provider]  isosorbide mononitrate (IMDUR) 60 MG 24 hr tablet Take 60 mg by mouth daily. 12/11/19   [provider]  Multiple Vitamin (MULTIVITAMIN WITH MINERALS) TABS tablet Take 1 tablet by mouth daily.    [provider]  NARCAN 4 MG/0.1ML LIQD nasal spray kit Place 4 mg into the nose as needed (opioid reversal). 06/18/19   [provider]  nitroGLYCERIN (NITROSTAT) 0.4 MG SL tablet PLACE 1 TABLET UNDER THE TONGUE EVERY 5 MINUTES AS NEEDED FOR CHEST PAIN Patient taking differently: Place 0.4 mg under the tongue every 5 (five) minutes as needed for chest pain. 07/17/19   Adrian Prows, MD  nystatin (MYCOSTATIN/NYSTOP) powder Apply 1 application topically 3 (three) times daily. 09/10/20   Truman Hayward, MD  pantoprazole (PROTONIX) 40 MG tablet Take 1 tablet (40 mg total) by mouth 2 (two) times daily. 09/04/20   Barb Merino, MD  rOPINIRole (REQUIP) 1 MG tablet Take 1 tablet (1 mg total) by mouth in the morning and at bedtime. 07/24/20   Addison Lank, PA-C  tamsulosin (FLOMAX) 0.4 MG CAPS capsule Take 1 capsule (0.4 mg total) by mouth 2 (two) times daily. 11/16/18   [name], NP    Physical Exam: Vitals:   11/11/2020 2015 11/11/2020 2028 10/16/2020 2030 10/16/2020 2100  BP: 137/69  137/78 (!) 136/91  Pulse: (!) 123  (!) 124 (!) 124  Resp: (!) 21  (!) 28 (!) 28  Temp:  98.6 F (37 C)    TempSrc:  Oral    SpO2: 94%  95% 92%  Weight:      Height:        Constitutional: Awake alert and oriented x3, patient is currently in respiratory distress. Skin: no rashes,  no lesions, notable poor skin turgor. Eyes: Pupils are equally reactive to light.  No evidence of scleral icterus or conjunctival pallor.  ENMT: Moist mucous membranes noted.  Posterior pharynx clear of any exudate or lesions.   Neck: normal, supple, no masses, no thyromegaly.  Evidence of jugular venous distention at 30 degrees. Respiratory: Extremely coarse rales noted in the bilateral mid and lower fields without any evidence of concurrent wheezing.  Patient is exhibiting evidence of increased respiratory effort with evidence of accessory muscle use. Cardiovascular: Tachycardic rate with regular rhythm.  No murmurs / rubs / gallops.  Extensive bilateral lower extremity pitting edema that tracks up to the knees.. 2+ pedal pulses. No carotid bruits.  Chest:   Nontender without crepitus or deformity.   Back:   Nontender without crepitus or deformity. Abdomen: Abdomen is soft and nontender.  No evidence of intra-abdominal masses.  Positive bowel sounds noted in all quadrants.   Musculoskeletal: Pain with both passive and active range of motion of the bilateral shoulders with some crepitus noted.  Shoulders are not particularly warm and exhibit no evidence of erythema.  No joint deformity upper and lower extremities. Good ROM, no contractures. Normal muscle tone.  Neurologic: CN 2-12 grossly intact. Sensation intact.  Patient moving all 4 extremities spontaneously.  Patient is following all commands.  Patient is responsive to verbal stimuli.   Psychiatric: Patient exhibits normal mood with appropriate affect.  Patient seems to possess insight as to their current situation.     Labs on Admission: I have personally reviewed following labs and imaging studies -   CBC: Recent Labs  Lab 11/10/2020 1449 11/04/2020 1533 10/26/2020 2000  WBC 9.0  --   --   NEUTROABS 6.7  --   --   HGB 10.5* 11.9* 11.2*  HCT 34.0* 35.0* 33.0*  MCV 93.9  --   --   PLT 369  --   --    Basic Metabolic Panel: Recent Labs   Lab 10/19/2020 1449 11/13/2020 1533 10/17/2020 2000  NA 134* 136 134*  K 3.8 3.7 4.0  CL 101 100  --   CO2 22  --   --   GLUCOSE 182* 185*  --   BUN 18 20  --   CREATININE 0.89 0.80  --   CALCIUM 8.8*  --   --    GFR: Estimated Creatinine Clearance: 88.9 mL/min (by C-G formula based on SCr of 0.8 mg/dL). Liver Function Tests: Recent Labs  Lab 10/23/2020 1449  AST 30  ALT 116*  ALKPHOS 182*  BILITOT 1.1  PROT 6.9  ALBUMIN 2.5*   No results for input(s): LIPASE, AMYLASE in the last 168 hours. No results for input(s): AMMONIA in the last 168 hours. Coagulation Profile: No results for input(s): INR, PROTIME in the last 168 hours. Cardiac Enzymes: No results for input(s): CKTOTAL, CKMB, CKMBINDEX, TROPONINI in the last 168 hours. BNP (last 3 results) No results for input(s): PROBNP in the last 8760 hours. HbA1C: No results for input(s): HGBA1C in the last 72 hours. CBG: Recent Labs  Lab 11/01/2020 1510  GLUCAP 154*   Lipid Profile: No results for input(s): CHOL, HDL, LDLCALC, TRIG, CHOLHDL, LDLDIRECT in the last 72 hours. Thyroid Function Tests: No results for input(s): TSH, T4TOTAL, FREET4, T3FREE, THYROIDAB in the last 72 hours. Anemia Panel: No results for input(s): VITAMINB12, FOLATE, FERRITIN, TIBC, IRON, RETICCTPCT in the last 72 hours. Urine analysis:    Component Value Date/Time   COLORURINE YELLOW 11/02/2020 [place] 11/01/2020 1647   LABSPEC 1.018 10/14/2020 1647   PHURINE 5.0 11/03/2020 1647   GLUCOSEU NEGATIVE 11/13/2020 1647   GLUCOSEU NEGATIVE 11/15/2014 1003   HGBUR NEGATIVE 10/30/2020 1647   BILIRUBINUR NEGATIVE 10/19/2020 1647   [place] 10/16/2020 1647   PROTEINUR 100 (A) 10/25/2020 1647   UROBILINOGEN 0.2 11/15/2014 1003   NITRITE NEGATIVE 10/21/2020 1647   LEUKOCYTESUR NEGATIVE 11/06/2020 1647    Radiological Exams on Admission - Personally Reviewed: DG Chest 1 View  Result Date: 11/05/2020 CLINICAL DATA:   Shortness of breath EXAM: CHEST  1 VIEW COMPARISON:  September 17, 2020 FINDINGS: There is extensive airspace opacity throughout the lungs bilaterally with the greatest concentration of airspace opacity in each mid lung and left base regions. Apparent granuloma medial right upper lobe toward the apex. There is cardiomegaly with pulmonary venous hypertension. Patient is status post coronary artery bypass grafting. No adenopathy. Degenerative change in each shoulder. Surgical clips left neck region. IMPRESSION: Multifocal airspace opacity concerning for multifocal pneumonia. A degree of pulmonary edema could present similarly. There is cardiomegaly with pulmonary vascular congestion. Status post coronary artery bypass grafting. Apparent granuloma right upper lobe toward the apex. Electronically Signed   By: Lowella Grip III M.D.   On: 10/22/2020 15:15   CT HEAD WO CONTRAST  Result Date: 10/18/2020 CLINICAL DATA:  Nonspecific dizziness. EXAM: CT HEAD WITHOUT CONTRAST TECHNIQUE: Contiguous axial images were obtained from the base of the skull through the vertex without intravenous contrast. COMPARISON:  MRI 08/31/2020 FINDINGS: Brain: No focal abnormality affects the brainstem or cerebellum. Mild cerebellar atrophy. Cerebral hemispheres show moderate atrophy with frontal lobe predominance. There chronic small-vessel ischemic changes of the white matter, frontal predominant. No large vessel territory infarction. No mass lesion, hemorrhage, hydrocephalus or extra-axial collection. Vascular: There is atherosclerotic calcification of the major vessels at the base of the brain. Skull: No acute finding. Sinuses/Orbits: Extensive widespread sinus surgery in the past. Widespread mucosal thickening. Opacification of the frontal sinuses. Other: None IMPRESSION: 1. No acute finding by CT. Brain atrophy with frontal lobe predominance. Chronic small-vessel ischemic changes of the white matter. 2. Extensive widespread sinus  surgery in the past. Widespread mucosal thickening. Opacification of the frontal sinuses. Electronically Signed   By: Nelson Chimes M.D.   On: 10/14/2020 17:28   CT CERVICAL SPINE WO CONTRAST  Result Date: 10/21/2020 CLINICAL DATA:  Dizziness.  Found on the floor.  Neck pain. EXAM: CT CERVICAL SPINE WITHOUT CONTRAST TECHNIQUE: Multidetector CT imaging of the cervical spine was performed without intravenous contrast. Multiplanar CT image reconstructions were also generated. COMPARISON:  04/28/2018 FINDINGS: The study suffers from considerable motion degradation. Alignment: No malalignment. Skull base and vertebrae: No visible regional fracture allowing for the motion degradation. Soft tissues and spinal canal: No soft tissue abnormality. Disc levels: Ordinary osteoarthritis at the C1-2 articulation. C2-3 is negative. C3-4: Chronic partially calcified disc protrusion. C4-5: Chronic partially calcified disc herniation. C5-6: Chronic partially calcified disc herniation. C6-7, C7-T1 and T1-2: Negative Upper chest: Pronounced density at the lung apices consistent with  pneumonia seen in chest radiography. Other: None IMPRESSION: 1. The study suffers from considerable motion degradation. No visible regional fracture. 2. Chronic partially calcified disc herniations from C3-4 through C5-6. 3. Pronounced density at the lung apices consistent with pneumonia seen in chest radiography. Electronically Signed   By: Nelson Chimes M.D.   On: 10/24/2020 17:31    EKG: Personally reviewed.  Rhythm is tachycardia with heart rate of 122 bpm.  Evidence of frequent PVCs and trigeminy.  No dynamic ST segment changes appreciated.  Assessment/Plan Principal Problem:   Acute respiratory failure with hypoxia [facility])   Patient presenting with 2 weeks of progressive shortness of breath, paroxysmal nocturnal dyspnea, pillow orthopnea, peripheral edema but also exhibiting evidence of productive cough with dark brown sputum and recent  diagnosis of recurrent group B strep infection.  Chest x-ray revealing extensive bilateral infiltrates which could possibly be infectious process or pulmonary edema.  Presentation is quite complicated.  I believe that patient's acute hypoxic story failure is probably multifactorial in nature secondary to acute on chronic congestive heart failure in addition to a pulmonary manifestation of patient's group B strep infection.  Exam reveals jugular venous tension and significant peripheral edema suggestive of acute congestive heart failure.  That being said, we must seriously consider these infiltrates could also have an infectious component.  Therefore, treating patient with both intravenous Lasix as blood pressure tolerates as well as intravenous antibiotics with intravenous ceftriaxone and azithromycin.  ABG obtained on 5 L oxygen via nasal cannula confirms acute hypoxic respiratory failure.  Obtaining CT imaging of the chest without contrast to better evaluate infiltrates  Obtain new echocardiogram in the morning  Providing patient with supplemental oxygen as necessary via nasal cannula this time, will consider trial of BiPAP if patient decompensates.  Active Problems: SIRS    Patient exhibiting multiple SIRS criteria including tachypnea and tachycardia on arrival  Not completely certain at this time of an infectious process, although pneumonia is being considered and empirically being treated for.  If this in indeed the case patient may be suffering from early sepsis.  Not administering 30cc/kg of IVF due to concern for volume overload  Blood cultures obtained  On IV abx as noted above.     Acute on chronic combined systolic and diastolic CHF (congestive heart failure) (HCC)   Please see assessment and plan above  Septic arthritis of bilateral shoulders   Patient has a rather complicated history in the past several months multiple hospitalizations for septic arthritis of  bilateral shoulders.  Isolated organisms have been group B strep as well as Pseudomonas however infectious disease believes that group B strep is the primary infectious organism.  Patient is currently on a regimen of oral Keflex due to concerns for recurrent group A strep infection in late February.  Currently on examination, shoulders not particularly tender, did not exhibit significant warmth or redness.  Holding Keflex for now as we have patient on intravenous ceftriaxone and azithromycin for pneumonia.  Because of known history of recurrent group B strep infection however, this must be considered as a possible infectious cause of the patient's bilateral infiltrates.    Essential hypertension  . Resume patients home regimen or oral antihypertensives . Titrate antihypertensive regimen as necessary to achieve adequate BP control . PRN intravenous antihypertensives for excessively elevated blood pressure    Coronary artery disease involving native heart without angina pectoris  . Patient has known history of extensive coronary artery disease status post CABG with subsequent cardiac  catheterizations showing substantial disease . States that he has been experiencing intermittent chest pressure in the past several weeks although he is unclear about the details.  Patient is currently chest pain-free however. . Monitoring patient on telemetry . Cycling cardiac enzymes . Continue home regimen of antiplatelet therapy and AV nodal blocking therapy    Type 2 diabetes mellitus with diabetic polyneuropathy, with long-term current use of insulin ([place])  . Patient been placed on Accu-Cheks before every meal and nightly with sliding scale insulin . Hemoglobin A1C ordered . Diabetic Diet    Elevated troponin level not due myocardial infarction   Slightly evaded troponins with flat trajectory making plaque rupture unlikely  Slight elevation likely secondary to supply demand mismatch  Continue to  cycle cardiac enzyme  Monitoring patient on telemetry    GERD without esophagitis  . Continuing home regimen of daily PPI therapy.    Generalized anxiety disorder   Continue home regimen of psychotropic medications including Abilify   Code Status:  DNR Family Communication:    Status is: Inpatient  Remains inpatient appropriate because:Ongoing diagnostic testing needed not appropriate for outpatient work up, IV treatments appropriate due to intensity of illness or inability to take PO and Inpatient level of care appropriate due to severity of illness   Dispo: The patient is from: Home              Anticipated d/c is to: SNF              Patient currently is not medically stable to d/c.   Difficult to place patient No        Vernelle Emerald MD [facility] Pager [phone]  If 7PM-7AM, please contact night-coverage [email or web address] Use universal [place] password for that web site. If you do not have the password, please call the hospital operator.  11/11/2020, 9:11 PM

## 2020-11-07 NOTE — ED Notes (Signed)
Pt returned from CT scan.

## 2020-11-07 NOTE — ED Notes (Signed)
Pt to CT scanner

## 2020-11-07 NOTE — ED Notes (Signed)
Pt states he does not have a pacemaker

## 2020-11-07 NOTE — ED Provider Notes (Signed)
I provided a substantive portion of the care of this patient.  I personally performed the entirety of the medical decision making for this encounter.  EKG Interpretation  Date/Time:  Friday November 07 2020 14:36:39 EDT Ventricular Rate:  121 PR Interval:    QRS Duration: 114 QT Interval:  335 QTC Calculation: 452 R Axis:   57 Text Interpretation: Atrial fibrillation Ventricular tachycardia, unsustained Borderline intraventricular conduction delay Borderline repolarization abnormality Confirmed by Lacretia Leigh ([id number]) on 11/08/2020 2:8:79 PM 75 year old male presents from home after falling on his head from his bed.  Patient does take Brilinta and denies any LOC.  Has had some increased weakness and shortness of breath.  He is not normally on oxygen.  On exam he is neurologically intact.  Plan at this point is for imaging and blood work and chest x-ray.   Lacretia Leigh, MD 10/16/2020 [phone]

## 2020-11-07 NOTE — Progress Notes (Signed)
Patient arrived to room 2w21 from ED.  Assessment complete, VS obtained, and Admission database began.

## 2020-11-07 NOTE — ED Triage Notes (Signed)
Pt BIB EMS from home due to fall. Pt neighbor came back to check on him and he was found on the ground . EMS was called. When EMS arrived on scene pt was hypoxia, hypotensive, tachycardia. EMS reports tachy 120's, Bp 80/40, 80% on RA. EMS gave 500cc bouls. Pt is a&ox4 on arrival.Pt has a hematoma to forehead.

## 2020-11-07 NOTE — Progress Notes (Signed)
Following for Code Sepsis  

## 2020-11-07 NOTE — ED Provider Notes (Addendum)
  Physical Exam  BP 128/89   Pulse (!) 120   Temp (!) 97.4 F (36.3 C) (Axillary)   Resp (!) 25   Ht 6' (1.829 m)   Wt 84.8 kg   SpO2 100%   BMI 25.36 kg/m   Physical Exam  ED Course/Procedures   Clinical Course as of 11/08/20 1728  Sat Nov 08, 2020  1723 Procalcitonin: <0.10 [AN]    Clinical Course User Index [AN] Varney Biles, MD    .Critical Care Performed by: Varney Biles, MD Authorized by: Varney Biles, MD   Critical care provider statement:    Critical care time (minutes):  48   Critical care was necessary to treat or prevent imminent or life-threatening deterioration of the following conditions:  Respiratory failure and circulatory failure   Critical care was time spent personally by me on the following activities:  Discussions with consultants, evaluation of patient's response to treatment, examination of patient, ordering and performing treatments and interventions, ordering and review of laboratory studies, ordering and review of radiographic studies, pulse oximetry, re-evaluation of patient's condition, obtaining history from patient or surrogate and review of old charts    MDM   Assuming care of patient from [place].   Patient in the ED for fall. Noted to be hypoxic when he arrived. Pt was found on the floor by his neighbor.  Patient has history of CKD, CAD.  He is not on any oxygen usually.  Currently he is on 15 L of oxygen via nonrebreather mask.  Workup thus far shows: multifocal pneumonia. Concerns for COVID-19. X-rays also showing some pulmonary edema. All the labs are pending at this time, including COVID-19 test.  Concerning findings are as following : Hypoxia, multifocal pneumonia on chest x-ray Important pending results are : COVID-19 results, lab results.  Patient will need reassessment and he will need to be admitted to the hospital.  Patient had no complains, no concerns from the nursing side. Will continue to  monitor.   Varney Biles, MD 10/30/2020 1628  Reassessment @ 1900: COVID-19 test is negative. I reassessed the patient.  He is slightly tachypneic.  He has got bibasilar rales.  No overt evidence of volume overload.  Clinically, concerns for CHF exacerbation.  X-ray is concerning for questionable pneumonia.  Other possibility includes PE.  However, patient has history of contrast allergy and x-rays clearly showing multifocal infiltrates.  The other possibility includes pulmonary contusion from his fall.  We will discussed the case with hospital staff in terms of advanced imaging.  For now, for questionable sepsis will initiate antibiotics.   Reassessment at [facility] staff will admit.  Questionable pneumonia versus CHF.   Varney Biles, MD 11/08/20 1714
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
## 2020-11-08 ENCOUNTER — Inpatient Hospital Stay (HOSPITAL_COMMUNITY): Payer: Medicare Other

## 2020-11-08 DIAGNOSIS — J189 Pneumonia, unspecified organism: Principal | ICD-10-CM

## 2020-11-08 DIAGNOSIS — J9601 Acute respiratory failure with hypoxia: Secondary | ICD-10-CM

## 2020-11-08 DIAGNOSIS — I2510 Atherosclerotic heart disease of native coronary artery without angina pectoris: Secondary | ICD-10-CM | POA: Diagnosis not present

## 2020-11-08 DIAGNOSIS — I5021 Acute systolic (congestive) heart failure: Secondary | ICD-10-CM

## 2020-11-08 DIAGNOSIS — I5043 Acute on chronic combined systolic (congestive) and diastolic (congestive) heart failure: Secondary | ICD-10-CM | POA: Diagnosis not present

## 2020-11-08 DIAGNOSIS — I10 Essential (primary) hypertension: Secondary | ICD-10-CM | POA: Diagnosis not present

## 2020-11-08 DIAGNOSIS — J441 Chronic obstructive pulmonary disease with (acute) exacerbation: Secondary | ICD-10-CM

## 2020-11-08 LAB — GLUCOSE, CAPILLARY
Glucose-Capillary: 150 mg/dL — ABNORMAL HIGH (ref 70–99)
Glucose-Capillary: 158 mg/dL — ABNORMAL HIGH (ref 70–99)
Glucose-Capillary: 141 mg/dL — ABNORMAL HIGH (ref 70–99)
Glucose-Capillary: 243 mg/dL — ABNORMAL HIGH (ref 70–99)
Glucose-Capillary: 151 mg/dL — ABNORMAL HIGH (ref 70–99)

## 2020-11-08 LAB — ECHOCARDIOGRAM COMPLETE
Area-P 1/2: 4.46 cm2
Calc EF: 41.2 %
Height: 72 in
MV M vel: 4.16 m/s
MV Peak grad: 69.2 mmHg
Radius: 0.7 cm
S' Lateral: 5.89 cm
Single Plane A2C EF: 43 %
Single Plane A4C EF: 41.4 %
Weight: 3079.39 oz

## 2020-11-08 LAB — APTT: aPTT: 33 seconds (ref 24–36)

## 2020-11-08 LAB — CBC
HCT: 34.3 % — ABNORMAL LOW (ref 39.0–52.0)
Hemoglobin: 11 g/dL — ABNORMAL LOW (ref 13.0–17.0)
MCH: 29.6 pg (ref 26.0–34.0)
MCHC: 32.1 g/dL (ref 30.0–36.0)
MCV: 92.5 fL (ref 80.0–100.0)
Platelets: 382 10*3/uL (ref 150–400)
RBC: 3.71 MIL/uL — ABNORMAL LOW (ref 4.22–5.81)
RDW: 17.6 % — ABNORMAL HIGH (ref 11.5–15.5)
WBC: 8.6 10*3/uL (ref 4.0–10.5)
nRBC: 0 % (ref 0.0–0.2)

## 2020-11-08 LAB — BASIC METABOLIC PANEL
Anion gap: 12 (ref 5–15)
BUN: 19 mg/dL (ref 8–23)
CO2: 22 mmol/L (ref 22–32)
Calcium: 9 mg/dL (ref 8.9–10.3)
Chloride: 101 mmol/L (ref 98–111)
Creatinine, Ser: 1.01 mg/dL (ref 0.61–1.24)
GFR, Estimated: >60 mL/min (ref >60)
Glucose, Bld: 169 mg/dL — ABNORMAL HIGH (ref 70–99)
Potassium: 4.4 mmol/L (ref 3.5–5.1)
Sodium: 135 mmol/L (ref 135–145)

## 2020-11-08 LAB — TROPONIN I (HIGH SENSITIVITY)
Troponin I (High Sensitivity): 60 ng/L — ABNORMAL HIGH (ref <18)
Troponin I (High Sensitivity): 70 ng/L — ABNORMAL HIGH (ref <18)

## 2020-11-08 LAB — PROCALCITONIN: Procalcitonin: <0.1 ng/mL

## 2020-11-08 LAB — PROTIME-INR
INR: 1.4 — ABNORMAL HIGH (ref 0.8–1.2)
Prothrombin Time: 16.2 seconds — ABNORMAL HIGH (ref 11.4–15.2)

## 2020-11-08 LAB — D-DIMER, QUANTITATIVE: D-Dimer, Quant: 4.47 ug/mL-FEU — ABNORMAL HIGH (ref 0.00–0.50)

## 2020-11-08 LAB — LACTIC ACID, PLASMA: Lactic Acid, Venous: 3.3 mmol/L (ref 0.5–1.9)

## 2020-11-08 LAB — MRSA PCR SCREENING: MRSA by PCR: NEGATIVE

## 2020-11-08 MED ORDER — SODIUM CHLORIDE 0.9 % IV SOLN
12.5000 mg | Freq: Four times a day (QID) | INTRAVENOUS | Status: DC | PRN
  Administered 2020-11-08: 12.5 mg via INTRAVENOUS
  Filled 2020-11-08: qty 0.5

## 2020-11-08 MED ORDER — HALOPERIDOL LACTATE 5 MG/ML IJ SOLN
3.0000 mg | Freq: Four times a day (QID) | INTRAMUSCULAR | Status: DC | PRN
Start: 2020-11-08 — End: 2020-11-10
  Administered 2020-11-08: 3 mg via INTRAVENOUS
  Filled 2020-11-08: qty 1

## 2020-11-08 MED ORDER — MOMETASONE FURO-FORMOTEROL FUM 100-5 MCG/ACT IN AERO
2.0000 | INHALATION_SPRAY | Freq: Two times a day (BID) | RESPIRATORY_TRACT | Status: DC
  Administered 2020-11-08 – 2020-11-09 (×4): 2 via RESPIRATORY_TRACT
  Filled 2020-11-08: qty 8.8

## 2020-11-08 MED ORDER — METHYLPREDNISOLONE SODIUM SUCC 125 MG IJ SOLR
60.0000 mg | Freq: Four times a day (QID) | INTRAMUSCULAR | Status: DC
  Administered 2020-11-08 – 2020-11-09 (×7): 60 mg via INTRAVENOUS
  Filled 2020-11-08 (×7): qty 2

## 2020-11-08 MED ORDER — PERFLUTREN LIPID MICROSPHERE
1.0000 mL | INTRAVENOUS | Status: AC | PRN
  Administered 2020-11-08: 2 mL via INTRAVENOUS
  Filled 2020-11-08: qty 10

## 2020-11-08 MED ORDER — PANTOPRAZOLE SODIUM 40 MG IV SOLR
40.0000 mg | Freq: Two times a day (BID) | INTRAVENOUS | Status: DC
  Administered 2020-11-08 – 2020-11-09 (×2): 40 mg via INTRAVENOUS
  Filled 2020-11-08 (×2): qty 40

## 2020-11-08 MED ORDER — ALBUTEROL SULFATE (2.5 MG/3ML) 0.083% IN NEBU: 2.5000 mg | INHALATION_SOLUTION | RESPIRATORY_TRACT | Status: DC | PRN

## 2020-11-08 MED ORDER — METOPROLOL TARTRATE 5 MG/5ML IV SOLN
2.5000 mg | INTRAVENOUS | Status: DC | PRN
  Administered 2020-11-08: 2.5 mg via INTRAVENOUS
  Filled 2020-11-08: qty 5

## 2020-11-08 MED ORDER — METOPROLOL TARTRATE 5 MG/5ML IV SOLN: 5.0000 mg | INTRAVENOUS | Status: DC | PRN

## 2020-11-08 NOTE — Progress Notes (Signed)
Patient resting with decreased work of breathing.  RR 17-20.  Patient is in mitts and wearing oxygen @ 6L HFNC

## 2020-11-08 NOTE — Progress Notes (Signed)
Cross-coverage note:   Notified that patient having coffee-ground emesis. Plan to keep NPO for now, hold ASA, use SCDs for now in place of pharmacologic VTE ppx, change oral PPI to IV q12h, and monitor H&H.

## 2020-11-08 NOTE — Progress Notes (Signed)
Patient ID: [name], male   DOB: [birth year], 75 y.o.   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  EMS found the patient to be hypotensive with blood pressure of 80/40 and hypoxic with oxygen saturation of 80% on room air.  He was given normal saline bolus.  In the ED, patient was quite hypoxic requiring nonrebreather mask and subsequently 5 L oxygen via nasal cannula.  BNP was 2212.  He was started on Rocephin, Zithromax and Lasix.  Assessment & Plan:   Acute hypoxic respiratory failure -Presented with worsening shortness of breath and was quite hypoxic on presentation requiring nonrebreather mask initially.  Currently on 6 L high flow nasal cannula oxygen.  Wean off as able.  CT of the chest without contrast was suggestive of multifocal pneumonia -If respiratory status does not improve, might need CT chest with contrast and/or pulmonary evaluation  Multifocal pneumonia -COVID-19 and influenza tests were negative.  Follow cultures.  Continue Rocephin and Zithromax -SLP evaluation  Acute on chronic combined systolic and diastolic heart failure -Continue Lasix.  Strict input and output.  Daily weights.  Fluid restriction.  Follow  echocardiogram.  Continue Coreg and isosorbide mononitrate  COPD with possible exacerbation -Start Solu-Medrol.  Continue Dulera along with nebs  Acute metabolic encephalopathy -Probably from hypoxia.  Patient also received a dose of Phenergan overnight.  Will DC Phenergan.  Monitor mental status.  Fall precautions.  CT of the head without contrast showed no acute findings  History of septic arthritis of bilateral shoulders -Patient has had complicated history in the past several months requiring multiple hospitalizations for septic arthritis of bilateral shoulders with group B strep/Pseudomonas identified.  Patient has currently been on oral Keflex as an outpatient as per ID.  Currently on Rocephin so Keflex on hold.  Outpatient follow-up with ID  SIRS -Possibly from above.  Follow cultures.  No IV fluids at this time because of heart failure  Mildly elevated troponins in a patient with history of CAD/CABG -Troponins did not trend up.  Most likely from demand ischemia.  Follow 2D echo. -Continue aspirin, Coreg, isosorbide mononitrate, ticagrelor.  Outpatient follow-up with cardiology  GERD -Continue PPI  Generalized conditioning -Overall prognosis is very poor.  Palliative care consultation for goals of care discussion -PT evaluation once more stable  Generalized anxiety disorder -Continue bupropion and Abilify.   DVT prophylaxis: Lovenox Code Status: DNR Family Communication: None at bedside Disposition Plan: Status is: Inpatient  Remains inpatient appropriate because:Inpatient level of care appropriate due to severity of illness   Dispo: The patient is from: Home              Anticipated d/c is to: Home in several days following clinical improvement              Patient currently is not medically stable to d/c.   Difficult to place patient No  Consultants: None  Procedures: None  Antimicrobials: Rocephin and Zithromax from 11/10/2020 onwards   Subjective: Patient  seen and examined at bedside.  Wakes up slightly, answers are very few questions but hardly able to hold conversation.  Poor historian.  No overnight fever, vomiting reported.  Patient was more confused overnight.  Objective: Vitals:   11/08/20 0315 11/08/20 0326 11/08/20 0407 11/08/20 0941  BP: (!) 152/65  (!) 144/67   Pulse: (!) 111  (!) 119   Resp: 20 20 20    Temp:   99.8 F (37.7 C)   TempSrc:   Axillary   SpO2: 92%  100% 96%  Weight:      Height:        Intake/Output Summary (Last 24 hours) at 11/08/2020 1021 Last data filed at 11/08/2020 0300 Gross per 24 hour  Intake 315 ml  Output -  Net 315 ml   Filed Weights   11/03/2020 1443 10/15/2020 2153  Weight: 84.8 kg 87.3 kg    Examination:  General exam: Appears chronically ill.  Currently on 6 L high flow nasal cannula oxygen.  No distress. Respiratory system: Bilateral decreased breath sounds at bases with scattered crackles Cardiovascular system: S1 & S2 heard, Rate controlled Gastrointestinal system: Abdomen is nondistended, soft and nontender. Normal bowel sounds heard. Extremities: No cyanosis, clubbing; lower extremity edema present Central nervous system: Wakes up slightly, hardly able to hold a conversation.  Poor historian.  Able to state his name.  No focal neurological deficits. Moving extremities Skin: No rashes, lesions or ulcers Psychiatry: Could not be assessed because of mental status    Data Reviewed: I have personally reviewed following labs and imaging studies  CBC: Recent Labs  Lab 10/22/2020 1449 11/03/2020 1533 11/06/2020 2000 11/08/20 0146  WBC 9.0  --   --  8.6  NEUTROABS 6.7  --   --   --   HGB 10.5* 11.9* 11.2* 11.0*  HCT 34.0* 35.0* 33.0* 34.3*  MCV 93.9  --   --  92.5  PLT 369  --   --  035   Basic Metabolic Panel: Recent Labs  Lab 11/05/2020 1449 10/22/2020 1533 11/01/2020 2000 11/13/2020 2212 11/08/20 0146  NA 134* 136 134*  --  135  K 3.8 3.7 4.0  --  4.4  CL 101 100  --   --  101  CO2  22  --   --   --  22  GLUCOSE 182* 185*  --   --  169*  BUN 18 20  --   --  19  CREATININE 0.89 0.80  --   --  1.01  CALCIUM 8.8*  --   --   --  9.0  MG  --   --   --  1.9  --    GFR: Estimated Creatinine Clearance: 70.4 mL/min (by C-G formula based on SCr of 1.01 mg/dL). Liver Function Tests: Recent Labs  Lab 10/20/2020 1449  AST 30  ALT 116*  ALKPHOS 182*  BILITOT 1.1  PROT 6.9  ALBUMIN 2.5*   No results for input(s): LIPASE, AMYLASE in the last 168 hours. No results for input(s): AMMONIA in the last 168 hours. Coagulation Profile: Recent Labs  Lab 10/28/2020 2212  INR 1.4*   Cardiac Enzymes: No results for input(s): CKTOTAL, CKMB, CKMBINDEX, TROPONINI in the last 168 hours. BNP (last 3 results) No results for input(s): PROBNP in the last 8760 hours. HbA1C: Recent Labs    10/21/2020 2212  HGBA1C 7.6*   CBG: Recent  Labs  Lab 10/27/2020 1510 11/08/20 0805  GLUCAP 154* 150*   Lipid Profile: No results for input(s): CHOL, HDL, LDLCALC, TRIG, CHOLHDL, LDLDIRECT in the last 72 hours. Thyroid Function Tests: No results for input(s): TSH, T4TOTAL, FREET4, T3FREE, THYROIDAB in the last 72 hours. Anemia Panel: No results for input(s): VITAMINB12, FOLATE, FERRITIN, TIBC, IRON, RETICCTPCT in the last 72 hours. Sepsis Labs: Recent Labs  Lab 10/27/2020 2029 11/03/2020 2212 11/08/20 0146  PROCALCITON  --  <0.10  --   LATICACIDVEN 2.6* 2.5* 3.3*    Recent Results (from the past 240 hour(s))  Resp Panel by RT-PCR (Flu A&B, Covid) Urine, Clean Catch     Status: None   Collection Time: 11/13/2020  4:11 PM   Specimen: Urine, Clean Catch; Nasopharyngeal(NP) swabs in vial transport medium  Result Value Ref Range Status   SARS Coronavirus 2 by RT PCR NEGATIVE NEGATIVE Final    Comment: (NOTE) SARS-CoV-2 target nucleic acids are NOT DETECTED.  The SARS-CoV-2 RNA is generally detectable in upper respiratory specimens during the acute phase of infection. The lowest concentration of  SARS-CoV-2 viral copies this assay can detect is 138 copies/mL. A negative result does not preclude SARS-Cov-2 infection and should not be used as the sole basis for treatment or other patient management decisions. A negative result may occur with  improper specimen collection/handling, submission of specimen other than nasopharyngeal swab, presence of viral mutation(s) within the areas targeted by this assay, and inadequate number of viral copies(<138 copies/mL). A negative result must be combined with clinical observations, patient history, and epidemiological information. The expected result is Negative.  Fact Sheet for Patients:  EntrepreneurPulse.com.au  Fact Sheet for Healthcare Providers:  IncredibleEmployment.be  This test is no t yet approved or cleared by the [place] FDA and  has been authorized for detection and/or diagnosis of SARS-CoV-2 by FDA under an Emergency Use Authorization (EUA). This EUA will remain  in effect (meaning this test can be used) for the duration of the COVID-19 declaration under Section 564(b)(1) of the Act, 21 U.S.C.section 360bbb-3(b)(1), unless the authorization is terminated  or revoked sooner.       Influenza A by PCR NEGATIVE NEGATIVE Final   Influenza B by PCR NEGATIVE NEGATIVE Final    Comment: (NOTE) The Xpert Xpress SARS-CoV-2/FLU/RSV plus assay is intended as an aid in the diagnosis of influenza from Nasopharyngeal swab specimens and should not be used as a sole basis for treatment. Nasal washings and aspirates are unacceptable for Xpert Xpress SARS-CoV-2/FLU/RSV testing.  Fact Sheet for Patients: EntrepreneurPulse.com.au  Fact Sheet for Healthcare Providers: IncredibleEmployment.be  This test is not yet approved or cleared by the [place] FDA and has been authorized for detection and/or diagnosis of SARS-CoV-2 by FDA under an Emergency Use  Authorization (EUA). This EUA will remain in effect (meaning this test can be used) for the duration of the COVID-19 declaration under Section 564(b)(1) of the Act, 21 U.S.C. section 360bbb-3(b)(1), unless the authorization is terminated or revoked.  Performed at [facility], [place] [address] [place], [place] [address]          Radiology Studies: DG Chest 1 View  Result Date: 10/31/2020 CLINICAL DATA:  Shortness of breath EXAM: CHEST  1 VIEW COMPARISON:  September 17, 2020 FINDINGS: There is extensive airspace opacity throughout the lungs bilaterally with the greatest concentration of airspace opacity in each mid lung and left base regions. Apparent granuloma medial right upper lobe toward the apex. There is cardiomegaly  with pulmonary venous hypertension. Patient is status post coronary artery bypass grafting. No adenopathy. Degenerative change in each shoulder. Surgical clips left neck region. IMPRESSION: Multifocal airspace opacity concerning for multifocal pneumonia. A degree of pulmonary edema could present similarly. There is cardiomegaly with pulmonary vascular congestion. Status post coronary artery bypass grafting. Apparent granuloma right upper lobe toward the apex. Electronically Signed   By: Lowella Grip III M.D.   On: 10/19/2020 15:15   CT HEAD WO CONTRAST  Result Date: 11/03/2020 CLINICAL DATA:  Nonspecific dizziness. EXAM: CT HEAD WITHOUT CONTRAST TECHNIQUE: Contiguous axial images were obtained from the base of the skull through the vertex without intravenous contrast. COMPARISON:  MRI 08/31/2020 FINDINGS: Brain: No focal abnormality affects the brainstem or cerebellum. Mild cerebellar atrophy. Cerebral hemispheres show moderate atrophy with frontal lobe predominance. There chronic small-vessel ischemic changes of the white matter, frontal predominant. No large vessel territory infarction. No mass lesion, hemorrhage, hydrocephalus or extra-axial collection. Vascular:  There is atherosclerotic calcification of the major vessels at the base of the brain. Skull: No acute finding. Sinuses/Orbits: Extensive widespread sinus surgery in the past. Widespread mucosal thickening. Opacification of the frontal sinuses. Other: None IMPRESSION: 1. No acute finding by CT. Brain atrophy with frontal lobe predominance. Chronic small-vessel ischemic changes of the white matter. 2. Extensive widespread sinus surgery in the past. Widespread mucosal thickening. Opacification of the frontal sinuses. Electronically Signed   By: Nelson Chimes M.D.   On: 11/03/2020 17:28   CT CHEST WO CONTRAST  Result Date: 11/05/2020 CLINICAL DATA:  Respiratory failure following fall, initial encounter EXAM: CT CHEST WITHOUT CONTRAST TECHNIQUE: Multidetector CT imaging of the chest was performed following the standard protocol without IV contrast. COMPARISON:  Chest x-ray from earlier in the same day. FINDINGS: Cardiovascular: Thoracic aorta demonstrates atherosclerotic calcifications and findings consistent with prior cardiac bypass grafting. Cardiac shadow is enlarged. No pericardial effusion is seen. The pulmonary artery as visualized is within normal limits. Mediastinum/Nodes: Thoracic inlet is unremarkable. Multiple small hilar and mediastinal lymph nodes are seen likely reactive in nature. The esophagus is within normal limits. Lungs/Pleura: Lungs demonstrates some chronic interstitial changes although with diffuse multifocal superimposed acute infiltrates bilaterally. Pleural effusions are seen bilaterally as well. Upper Abdomen: Visualized upper abdomen shows left renal cyst. Musculoskeletal: Significant degenerative changes of the shoulder joints are noted bilaterally with joint effusions. Degenerative change of the thoracic spine is seen. IMPRESSION: Diffuse multifocal infiltrates bilaterally superimposed over more chronic interstitial changes. This is most consistent with multifocal pneumonia although  edema could have this appearance as well. Small bilateral pleural effusions. Reactive lymphadenopathy in the hila and mediastinum. Aortic Atherosclerosis (ICD10-I70.0). Electronically Signed   By: Inez Catalina M.D.   On: 10/25/2020 21:35   CT CERVICAL SPINE WO CONTRAST  Result Date: 11/08/2020 CLINICAL DATA:  Dizziness.  Found on the floor.  Neck pain. EXAM: CT CERVICAL SPINE WITHOUT CONTRAST TECHNIQUE: Multidetector CT imaging of the cervical spine was performed without intravenous contrast. Multiplanar CT image reconstructions were also generated. COMPARISON:  04/28/2018 FINDINGS: The study suffers from considerable motion degradation. Alignment: No malalignment. Skull base and vertebrae: No visible regional fracture allowing for the motion degradation. Soft tissues and spinal canal: No soft tissue abnormality. Disc levels: Ordinary osteoarthritis at the C1-2 articulation. C2-3 is negative. C3-4: Chronic partially calcified disc protrusion. C4-5: Chronic partially calcified disc herniation. C5-6: Chronic partially calcified disc herniation. C6-7, C7-T1 and T1-2: Negative Upper chest: Pronounced density at the lung apices consistent with pneumonia seen in  chest radiography. Other: None IMPRESSION: 1. The study suffers from considerable motion degradation. No visible regional fracture. 2. Chronic partially calcified disc herniations from C3-4 through C5-6. 3. Pronounced density at the lung apices consistent with pneumonia seen in chest radiography. Electronically Signed   By: Nelson Chimes M.D.   On: 11/13/2020 17:31        Scheduled Meds: . ARIPiprazole  5 mg Oral Daily  . aspirin EC  81 mg Oral Daily  . buPROPion  300 mg Oral Daily  . carvedilol  12.5 mg Oral BID WC  . enoxaparin (LOVENOX) injection  40 mg Subcutaneous Q24H  . furosemide  40 mg Intravenous BID  . gabapentin  100 mg Oral BID  . insulin aspart  0-15 Units Subcutaneous TID AC & HS  . isosorbide mononitrate  60 mg Oral Daily  .  methylPREDNISolone (SOLU-MEDROL) injection  60 mg Intravenous Q6H  . mometasone-formoterol  2 puff Inhalation BID  . pantoprazole  40 mg Oral BID  . rOPINIRole  1 mg Oral BID  . tamsulosin  0.4 mg Oral BID  . ticagrelor  90 mg Oral BID   Continuous Infusions: . azithromycin 500 mg (10/30/2020 2252)  . cefTRIAXone (ROCEPHIN)  IV 2 g (10/31/2020 2041)          Aline August, MD [facility] 11/08/2020, 10:21 AM

## 2020-11-08 NOTE — Progress Notes (Addendum)
Notified MD that patient was pulling off oxygen and very agitated, orders received for haldol

## 2020-11-08 NOTE — Progress Notes (Signed)
Date and time results received: 11/08/20 0612  Test: troponin Critical Value: 60  Name of Provider Notified: Opyd

## 2020-11-08 NOTE — Evaluation (Signed)
Clinical/Bedside Swallow Evaluation Patient Details  Name: [name] MRN: [id number] Date of Birth: Aug 30, 1945  Today's Date: 11/08/2020 Time: SLP Start Time (ACUTE ONLY): 80 SLP Stop Time (ACUTE ONLY): 1654 SLP Time Calculation (min) (ACUTE ONLY): 29 min  Past Medical History:  Past Medical History:  Diagnosis Date  . Acute renal failure (ARF) ([place]) 08/21/2020  . Anemia    NOS  . Anxiety   . 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 Blurry vision 09/10/2020  . BPH (benign prostatic hypertrophy)   . Broken foot Oct. 12, 2013   Right foot Fx  . CAD (coronary artery disease)   . Carotid stenosis, left   . Cellulitis and abscess of leg 03/2018   right leg  . CHF (congestive heart failure) ([place])   . Chronic hyponatremia   . Chronic pain syndrome   . COPD (chronic obstructive pulmonary disease) ([place])   . Depression   . Diabetes mellitus   . Diabetic foot ulcer ([place])   . DJD (degenerative joint disease)    L wrist  . DM2 (diabetes mellitus, type 2) ([place])   . GERD (gastroesophageal reflux disease)   . HLD (hyperlipidemia)   . HTN (hypertension)   . Hx of blood transfusion reaction   . Hyponatremia   . Lumbar disc disease   . MI (mitral incompetence)   . Myocardial infarction ([place])    x 2  . Osteoporosis    tx per [facility], Dr Prudencio Burly, thought due to heavy steriod use after 1978  . Peripheral neuropathy   . Peripheral vascular disease ([place])   . Pseudomonas infection 10/10/2020  . Restless leg syndrome   . Spine fracture    hx, multiple  . Varicose veins   . 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 Vitamin D deficiency    Past Surgical History:  Past Surgical History:  Procedure Laterality Date  . ANGIOPLASTY    . aorta bifemoral bypass grafting  09/2010  . CARDIAC CATHETERIZATION N/A 09/19/2015   Procedure: Left Heart Cath and Coronary Angiography;  Surgeon: Adrian Prows, MD;  Location: [place] [facility];  Service: Cardiovascular;   Laterality: N/A;  . CARPAL TUNNEL RELEASE     right 2006/ left 2007  . CATARACT EXTRACTION     left 1996/ right 1997  . CORONARY ARTERY BYPASS GRAFT N/A 09/22/2015   Procedure: CORONARY ARTERY BYPASS GRAFTING (CABG) times four using the right greater saphenous vein harvested endoscopically and the left internal mammary artery.  LIMA-LAD, SEQ SVG-DIAG & OM, SVG-PD.;  Surgeon: [name], MD;  Location: [place];  Service: Open Heart Surgery;  Laterality: N/A;  . I & D EXTREMITY Right 04/07/2018   Procedure: IRRIGATION AND DEBRIDEMENT ABSCESS RIGHT LEG;  Surgeon: Newt Minion, MD;  Location: [place];  Service: Orthopedics;  Laterality: Right;  . INCISION AND DRAINAGE OF WOUND Right 05/07/2020   Procedure: RIGHT LONG FINGER IRRIGATION AND DEBRIDEMENT AND AMPUTATION;  Surgeon: Iran Planas, MD;  Location: [place];  Service: Orthopedics;  Laterality: Right;  with IV sedation  . INCISION AND DRAINAGE OF WOUND Right 05/12/2020   Procedure: IRRIGATION AND DEBRIDEMENT WOUND and possible revision amputation;  Surgeon: Iran Planas, MD;  Location: [place];  Service: Orthopedics;  Laterality: Right;  needs 60 minutes  . IR FLUORO GUIDE CV LINE LEFT  08/25/2020  . IR FLUORO GUIDE CV LINE RIGHT  07/18/2020  . IR REMOVAL TUN  CV CATH W/O FL  10/01/2020  . IR US GUIDE VASC ACCESS LEFT  08/25/2020  . IR US GUIDE VASC ACCESS RIGHT  07/18/2020  . L foot open repair jones fracture  2010    5th metetarsal   . left ankle ganglion cyst  1976  . left carotid endarterectomy  03/2011  . left cataract  1996   right - 1997  . left CTS  2006   R CTS - 2007  . left foot surgery  1998   R surgery/fracture - 1999  . left plantar ganglion cystectomy  1979  . left wrist/hand fusion  2008  . [place], 2004  . NASAL SINUS SURGERY     multiple- x8. last 1997 with obliteration  . REPAIR THORACIC AORTA  2017  . right hand fracture  1969  . ROTATOR CUFF REPAIR  2006   R, than repeat 2011, Dr Theda Sers  . SHOULDER  ARTHROSCOPY Bilateral 07/17/2020   Procedure: Right shoulder arthroscopic lavage; Left shoulder aspiration.;  Surgeon: Justice Britain, MD;  Location: [facility];  Service: Orthopedics;  Laterality: Bilateral;  132min  . SHOULDER ARTHROSCOPY WITH LABRAL REPAIR Left 07/10/2020   Procedure: SHOULDER ARTHROSCOPY WITH WASHOUT;  Surgeon: Justice Britain, MD;  Location: [facility];  Service: Orthopedics;  Laterality: Left;  . TRIGGER FINGER RELEASE Left 11/11/2014   Procedure: LEFT LONG FINGER RELEASE TRIGGER FINGER/A-1 PULLEY;  Surgeon: Milly Jakob, MD;  Location: [place];  Service: Orthopedics;  Laterality: Left;  . vocal surgery  [id number]   HPI:  74 year old male with past medical history of systolic and diastolic congestive heart failure, diabetes mellitus type 2, diabetic polyneuropathy, peripheral artery disease (S/P multiple interventions), coronary artery disease (S/P CABG, severe multivessel disease),gastroesophageal reflux disease, hyperlipidemia, generalized anxiety disorder who presents to [facility] emergency department after being found down in his apartment by his neighbor CT chest on 10/14/2020 indicated diffuse multifocal infiltrates bilaterally superimposed over more chronic interstitial changes. This is most consistent with multifocal pneumonia although edema could have this appearance as well; BSE generated.  Assessment / Plan / Recommendation Clinical Impression  Pt seen for clinical swallowing evaluation with delayed cough noted after larger volumes of thin liquids.  This was eliminated with smaller amounts of thin liquids given with min verbal cueing for consumption.  Pt c/o difficulty chewing meats during lunch, but normally this isn't an issue per pt report.  Nursing also reported pt experiencing decreased ability to masticate meats during meals.  Pt with increased work of breathing/SOB impacting swallowing/breathing reciprocity.  Educated pt re: consuming smaller amounts  and utilizing a slow rate to assist with improving swallowing/breathing balance. Pt in agreement, but required min verbal cueing intermittently during assessment.  No overt s/s of aspiration noted with puree/solids during consumption.  Recommend continuing current diet, but ST will f/u briefly for diet tolerance to adjust diet accordingly if needed and educate pt re: aspiration/swallowing precautions.  Thank you for this consult. SLP Visit Diagnosis: Dysphagia, unspecified (R13.10)    Aspiration Risk  Mild aspiration risk    Diet Recommendation   Regular/thin liquids  Medication Administration: Whole meds with puree    Other  Recommendations Oral Care Recommendations: Oral care BID   Follow up Recommendations Other (comment) (TBD)      Frequency and Duration min 1 x/week  1 week       Prognosis Prognosis for Safe Diet Advancement: Good      Swallow Study   General  Date of Onset: 10/31/2020 HPI: 76 year old male with past medical history of systolic and diastolic congestive heart failure, diabetes mellitus type 2, diabetic polyneuropathy, peripheral artery disease (S/P multiple interventions), coronary artery disease (S/P CABG, severe multivessel disease),gastroesophageal reflux disease, hyperlipidemia, generalized anxiety disorder who presents to [facility] emergency department after being found down in his apartment by his neighbor Type of Study: Bedside Swallow Evaluation Previous Swallow Assessment: n/a Diet Prior to this Study: Thin liquids;Regular Temperature Spikes Noted: Yes (low grade, 99/100) Respiratory Status: Other (comment) (HFNC 6L) History of Recent Intubation: No Behavior/Cognition: Alert;Cooperative;Requires cueing Oral Cavity Assessment: Dry Oral Care Completed by SLP: Recent completion by staff Oral Cavity - Dentition: Dentures, top;Dentures, bottom Self-Feeding Abilities: Needs assist Patient Positioning: Upright in bed Baseline Vocal Quality: Low  vocal intensity Volitional Cough: Weak Volitional Swallow: Able to elicit    Oral/Motor/Sensory Function Overall Oral Motor/Sensory Function: Generalized oral weakness   Ice Chips Ice chips: Within functional limits Presentation: Spoon   Thin Liquid Thin Liquid: Impaired Presentation: Straw;Cup Pharyngeal  Phase Impairments: Cough - Delayed Other Comments: larger volumes d/t SOB only    Nectar Thick Nectar Thick Liquid: Not tested   Honey Thick Honey Thick Liquid: Not tested   Puree Puree: Within functional limits Presentation: Spoon   Solid     Solid: Impaired Presentation: Spoon Oral Phase Impairments: Impaired mastication Oral Phase Functional Implications: Prolonged oral transit (nursing mentioned decreased ability to chew meats; okay for evaluation, but needs to be seen for a meal)      Elvina Sidle, M.S., CCC-SLP 11/08/2020,6:44 PM

## 2020-11-08 NOTE — Progress Notes (Signed)
  Echocardiogram 2D Echocardiogram with definity has been performed.  Darlina Sicilian M 11/08/2020, 12:04 PM

## 2020-11-08 NOTE — Progress Notes (Addendum)
Notified Rapid Response nurse of patient's condition overnight.  Shanon Brow responded and assessed the patient, no new interventions recommended at this time.

## 2020-11-08 NOTE — Progress Notes (Signed)
Patient not tolerating bipap due to agitation, placed patient back on 6L HFNC, Notified RT and MD

## 2020-11-08 NOTE — Progress Notes (Signed)
Patient vomited coffee colored emesis about 200 mls. MD notified by page.

## 2020-11-08 NOTE — Progress Notes (Signed)
Patient sats 98-100% on 6L HFNC, patient is lethargic and confused. MD notified and RT consulted.  Orders received for bipap prn.

## 2020-11-09 DIAGNOSIS — Z515 Encounter for palliative care: Secondary | ICD-10-CM

## 2020-11-09 DIAGNOSIS — I10 Essential (primary) hypertension: Secondary | ICD-10-CM | POA: Diagnosis not present

## 2020-11-09 DIAGNOSIS — Z7189 Other specified counseling: Secondary | ICD-10-CM | POA: Diagnosis not present

## 2020-11-09 DIAGNOSIS — I5043 Acute on chronic combined systolic (congestive) and diastolic (congestive) heart failure: Secondary | ICD-10-CM | POA: Diagnosis not present

## 2020-11-09 DIAGNOSIS — I2510 Atherosclerotic heart disease of native coronary artery without angina pectoris: Secondary | ICD-10-CM | POA: Diagnosis not present

## 2020-11-09 DIAGNOSIS — J9601 Acute respiratory failure with hypoxia: Secondary | ICD-10-CM | POA: Diagnosis not present

## 2020-11-09 LAB — COMPREHENSIVE METABOLIC PANEL
ALT: 191 U/L — ABNORMAL HIGH (ref 0–44)
AST: 248 U/L — ABNORMAL HIGH (ref 15–41)
Albumin: 2.4 g/dL — ABNORMAL LOW (ref 3.5–5.0)
Alkaline Phosphatase: 177 U/L — ABNORMAL HIGH (ref 38–126)
Anion gap: 12 (ref 5–15)
BUN: 40 mg/dL — ABNORMAL HIGH (ref 8–23)
CO2: 21 mmol/L — ABNORMAL LOW (ref 22–32)
Calcium: 8.6 mg/dL — ABNORMAL LOW (ref 8.9–10.3)
Chloride: 103 mmol/L (ref 98–111)
Creatinine, Ser: 1.61 mg/dL — ABNORMAL HIGH (ref 0.61–1.24)
GFR, Estimated: 45 mL/min — ABNORMAL LOW (ref >60)
Glucose, Bld: 138 mg/dL — ABNORMAL HIGH (ref 70–99)
Potassium: 5.3 mmol/L — ABNORMAL HIGH (ref 3.5–5.1)
Sodium: 136 mmol/L (ref 135–145)
Total Bilirubin: 1.4 mg/dL — ABNORMAL HIGH (ref 0.3–1.2)
Total Protein: 6.4 g/dL — ABNORMAL LOW (ref 6.5–8.1)

## 2020-11-09 LAB — CBC WITH DIFFERENTIAL/PLATELET
Abs Immature Granulocytes: 0.07 10*3/uL (ref 0.00–0.07)
Basophils Absolute: 0 10*3/uL (ref 0.0–0.1)
Basophils Relative: 0 %
Eosinophils Absolute: 0 10*3/uL (ref 0.0–0.5)
Eosinophils Relative: 0 %
HCT: 33.7 % — ABNORMAL LOW (ref 39.0–52.0)
Hemoglobin: 10.6 g/dL — ABNORMAL LOW (ref 13.0–17.0)
Immature Granulocytes: 1 %
Lymphocytes Relative: 9 %
Lymphs Abs: 0.6 10*3/uL — ABNORMAL LOW (ref 0.7–4.0)
MCH: 29.6 pg (ref 26.0–34.0)
MCHC: 31.5 g/dL (ref 30.0–36.0)
MCV: 94.1 fL (ref 80.0–100.0)
Monocytes Absolute: 0.3 10*3/uL (ref 0.1–1.0)
Monocytes Relative: 5 %
Neutro Abs: 5.3 10*3/uL (ref 1.7–7.7)
Neutrophils Relative %: 85 %
Platelets: 362 10*3/uL (ref 150–400)
RBC: 3.58 MIL/uL — ABNORMAL LOW (ref 4.22–5.81)
RDW: 17.8 % — ABNORMAL HIGH (ref 11.5–15.5)
WBC: 6.3 10*3/uL (ref 4.0–10.5)
nRBC: 0 % (ref 0.0–0.2)

## 2020-11-09 LAB — GLUCOSE, CAPILLARY
Glucose-Capillary: 195 mg/dL — ABNORMAL HIGH (ref 70–99)
Glucose-Capillary: 265 mg/dL — ABNORMAL HIGH (ref 70–99)
Glucose-Capillary: 275 mg/dL — ABNORMAL HIGH (ref 70–99)
Glucose-Capillary: 222 mg/dL — ABNORMAL HIGH (ref 70–99)

## 2020-11-09 LAB — FOLATE: Folate: 26.9 ng/mL (ref >5.9)

## 2020-11-09 LAB — VITAMIN B12: Vitamin B-12: 1859 pg/mL — ABNORMAL HIGH (ref 180–914)

## 2020-11-09 LAB — TSH: TSH: 2.409 u[IU]/mL (ref 0.350–4.500)

## 2020-11-09 LAB — MAGNESIUM: Magnesium: 2.1 mg/dL (ref 1.7–2.4)

## 2020-11-09 LAB — AMMONIA: Ammonia: 38 umol/L — ABNORMAL HIGH (ref 9–35)

## 2020-11-09 MED ORDER — FUROSEMIDE 10 MG/ML IJ SOLN
40.0000 mg | Freq: Every day | INTRAMUSCULAR | Status: DC
  Administered 2020-11-09: 40 mg via INTRAVENOUS
  Filled 2020-11-09: qty 4

## 2020-11-12 ENCOUNTER — Ambulatory Visit: Payer: Medicare Other | Admitting: Infectious Disease

## 2020-11-12 LAB — CULTURE, BLOOD (ROUTINE X 2)
Culture: NO GROWTH
Culture: NO GROWTH
Special Requests: ADEQUATE

## 2020-11-14 NOTE — Death Summary Note (Addendum)
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  EMS found the patient to be hypotensive with blood pressure of 80/40 and hypoxic with oxygen saturation of 80% on room air.  He was given normal saline bolus.  In the ED, patient was quite hypoxic requiring nonrebreather mask and subsequently 5 L oxygen via nasal cannula.  BNP was 2210-12-05.  He was started on Rocephin, Zithromax and Lasix.  Cardiology was consulted.  Palliative care was consulted for very poor prognosis and goals of care discussion.  At night of 2020/12/08, he was found unresponsive and was noted to be in asystole.  Time of death was 2123-12-05.    Final Diagnoses:  Acute hypoxic respiratory failure Multifocal pneumonia Acute on chronic chronic mild systolic and diastolic heart failure acute kidney injury COPD with possible exacerbation acute metabolic encephalopathy probable upper GI bleeding Elevated LFTs History of septic arthritis of bilateral shoulders SIRS Mildly elevated troponins in a patient with history of CAD/CABG GERD Generalized anxiety disorder   The results of significant diagnostics from this hospitalization  (including imaging, microbiology, ancillary and laboratory) are listed below for reference.    Significant Diagnostic Studies: DG Chest 1 View  Result Date: 2020-12-06 CLINICAL DATA:  Shortness of breath EXAM: CHEST  1 VIEW COMPARISON:  September 17, 2020 FINDINGS: There is extensive airspace opacity throughout the lungs bilaterally with the greatest concentration of airspace opacity in each mid lung and left base regions. Apparent granuloma medial right upper lobe toward the apex. There is cardiomegaly with pulmonary venous hypertension. Patient is status post coronary artery bypass grafting. No adenopathy. Degenerative change in each shoulder. Surgical clips left neck region. IMPRESSION: Multifocal airspace opacity concerning for multifocal pneumonia. A degree of pulmonary edema could present similarly. There is cardiomegaly with pulmonary vascular congestion. Status post coronary artery bypass grafting. Apparent granuloma right upper lobe toward the apex. Electronically Signed   By: Lowella Grip III M.D.   On: 12-06-20 15:15   CT HEAD WO CONTRAST  Result Date: 12-06-2020 CLINICAL DATA:  Nonspecific dizziness. EXAM: CT HEAD WITHOUT CONTRAST TECHNIQUE: Contiguous axial images were obtained from the base of the skull through the vertex without intravenous contrast. COMPARISON:  MRI 08/31/2020 FINDINGS: Brain: No focal abnormality affects the brainstem or cerebellum. Mild cerebellar atrophy. Cerebral hemispheres show moderate atrophy with frontal lobe predominance. There chronic small-vessel ischemic changes of the white matter, frontal predominant. No large vessel territory infarction. No mass lesion, hemorrhage, hydrocephalus or extra-axial collection. Vascular: There is atherosclerotic calcification of the major vessels at the base of the brain. Skull: No acute finding. Sinuses/Orbits: Extensive widespread sinus surgery in the past. Widespread mucosal thickening. Opacification of the frontal sinuses.  Other: None IMPRESSION: 1. No acute finding by CT. Brain atrophy with frontal lobe predominance. Chronic small-vessel ischemic changes of the white matter. 2. Extensive widespread sinus surgery in the past.  Widespread mucosal thickening. Opacification of the frontal sinuses. Electronically Signed   By: Nelson Chimes M.D.   On: 11/08/2020 17:28   CT CHEST WO CONTRAST  Result Date: 10/31/2020 CLINICAL DATA:  Respiratory failure following fall, initial encounter EXAM: CT CHEST WITHOUT CONTRAST TECHNIQUE: Multidetector CT imaging of the chest was performed following the standard protocol without IV contrast. COMPARISON:  Chest x-ray from earlier in the same day. FINDINGS: Cardiovascular: Thoracic aorta demonstrates atherosclerotic calcifications and findings consistent with prior cardiac bypass grafting. Cardiac shadow is enlarged. No pericardial effusion is seen. The pulmonary artery as visualized is within normal limits. Mediastinum/Nodes: Thoracic inlet is unremarkable. Multiple small hilar and mediastinal lymph nodes are seen likely reactive in nature. The esophagus is within normal limits. Lungs/Pleura: Lungs demonstrates some chronic interstitial changes although with diffuse multifocal superimposed acute infiltrates bilaterally. Pleural effusions are seen bilaterally as well. Upper Abdomen: Visualized upper abdomen shows left renal cyst. Musculoskeletal: Significant degenerative changes of the shoulder joints are noted bilaterally with joint effusions. Degenerative change of the thoracic spine is seen. IMPRESSION: Diffuse multifocal infiltrates bilaterally superimposed over more chronic interstitial changes. This is most consistent with multifocal pneumonia although edema could have this appearance as well. Small bilateral pleural effusions. Reactive lymphadenopathy in the hila and mediastinum. Aortic Atherosclerosis (ICD10-I70.0). Electronically Signed   By: Inez Catalina M.D.   On: 10/18/2020 21:35   CT  CERVICAL SPINE WO CONTRAST  Result Date: 10/25/2020 CLINICAL DATA:  Dizziness.  Found on the floor.  Neck pain. EXAM: CT CERVICAL SPINE WITHOUT CONTRAST TECHNIQUE: Multidetector CT imaging of the cervical spine was performed without intravenous contrast. Multiplanar CT image reconstructions were also generated. COMPARISON:  04/28/2018 FINDINGS: The study suffers from considerable motion degradation. Alignment: No malalignment. Skull base and vertebrae: No visible regional fracture allowing for the motion degradation. Soft tissues and spinal canal: No soft tissue abnormality. Disc levels: Ordinary osteoarthritis at the C1-2 articulation. C2-3 is negative. C3-4: Chronic partially calcified disc protrusion. C4-5: Chronic partially calcified disc herniation. C5-6: Chronic partially calcified disc herniation. C6-7, C7-T1 and T1-2: Negative Upper chest: Pronounced density at the lung apices consistent with pneumonia seen in chest radiography. Other: None IMPRESSION: 1. The study suffers from considerable motion degradation. No visible regional fracture. 2. Chronic partially calcified disc herniations from C3-4 through C5-6. 3. Pronounced density at the lung apices consistent with pneumonia seen in chest radiography. Electronically Signed   By: Nelson Chimes M.D.   On: 10/20/2020 17:31   ECHOCARDIOGRAM COMPLETE  Result Date: 11/08/2020    ECHOCARDIOGRAM REPORT   Patient Name:   CATHAN GEARIN Date of Exam: 11/08/2020 Medical Rec #:  [id number]         Height:       72.0 in Accession #:    [id number]        Weight:       192.5 lb Date of Birth:  [birth year]         BSA:          2.096 m Patient Age:    10 years          BP:           165/67 mmHg Patient Gender: M                 HR:           116 bpm. Exam Location:  Inpatient Procedure: 2D Echo, Cardiac Doppler, Color Doppler and Intracardiac  Opacification Agent Indications:    CHF-Acute Systolic X50.56  History:        Patient has prior history of  Echocardiogram examinations, most                 recent 07/10/2020. CHF, CAD, Prior CABG, COPD and PAD; Risk                 Factors:Hypertension, Dyslipidemia and Diabetes. GERD.                 Multifocal pneumonia. Acute metabolic encephalopathy. History of                 septic arthritis of bilateral shoulders. Anemia.  Sonographer:    Darlina Sicilian RDCS Referring Phys: [id number] [place]  1. Left ventricular ejection fraction, by estimation, is 25 to 30%. The left ventricle has severely decreased function. The left ventricle demonstrates global hypokinesis. The left ventricular internal cavity size was mildly to moderately dilated. Indeterminate diastolic filling due to E-A fusion.  2. Right ventricular systolic function is moderately reduced. The right ventricular size is mildly enlarged. There is moderately elevated pulmonary artery systolic pressure. The estimated right ventricular systolic pressure is 65.5 mmHg.  3. Left atrial size was mildly dilated.  4. Right atrial size was mildly dilated.  5. The mitral valve is grossly normal. Mild to moderate mitral valve regurgitation. No evidence of mitral stenosis.  6. Tricuspid valve regurgitation is moderate.  7. The aortic valve is tricuspid. There is mild calcification of the aortic valve. There is mild thickening of the aortic valve. Aortic valve regurgitation is mild. Mild aortic valve sclerosis is present, with no evidence of aortic valve stenosis.  8. The inferior vena cava is dilated in size with <50% respiratory variability, suggesting right atrial pressure of 15 mmHg. Comparison(s): Changes from prior study are noted. LVEF is now severely reduced ~25-30%. LV cavity is moderately dilated. Moderately elevated RVSP. FINDINGS  Left Ventricle: Left ventricular ejection fraction, by estimation, is 25 to 30%. The left ventricle has severely decreased function. The left ventricle demonstrates global hypokinesis. The left ventricular  internal cavity size was mildly to moderately dilated. There is no left ventricular hypertrophy. Indeterminate diastolic filling due to E-A fusion. Right Ventricle: The right ventricular size is mildly enlarged. No increase in right ventricular wall thickness. Right ventricular systolic function is moderately reduced. There is moderately elevated pulmonary artery systolic pressure. The tricuspid regurgitant velocity is 3.06 m/s, and with an assumed right atrial pressure of 15 mmHg, the estimated right ventricular systolic pressure is 37.4 mmHg. Left Atrium: Left atrial size was mildly dilated. Right Atrium: Right atrial size was mildly dilated. Pericardium: There is no evidence of pericardial effusion. Mitral Valve: The mitral valve is grossly normal. Mild to moderate mitral valve regurgitation. No evidence of mitral valve stenosis. Tricuspid Valve: The tricuspid valve is grossly normal. Tricuspid valve regurgitation is moderate . No evidence of tricuspid stenosis. Aortic Valve: The aortic valve is tricuspid. There is mild calcification of the aortic valve. There is mild thickening of the aortic valve. Aortic valve regurgitation is mild. Mild aortic valve sclerosis is present, with no evidence of aortic valve stenosis. Pulmonic Valve: The pulmonic valve was grossly normal. Pulmonic valve regurgitation is trivial. No evidence of pulmonic stenosis. Aorta: The aortic root is normal in size and structure. Venous: The inferior vena cava is dilated in size with less than 50% respiratory variability, suggesting right atrial pressure of 15 mmHg. IAS/Shunts: The  atrial septum is grossly normal.  LEFT VENTRICLE PLAX 2D LVIDd:         6.23 cm LVIDs:         5.89 cm LV PW:         0.90 cm LV IVS:        0.90 cm LVOT diam:     2.60 cm LV SV:         48 LV SV Index:   23 LVOT Area:     5.31 cm  LV Volumes (MOD) LV vol d, MOD A2C: 242.0 ml LV vol d, MOD A4C: 268.0 ml LV vol s, MOD A2C: 138.0 ml LV vol s, MOD A4C: 157.0 ml LV SV  MOD A2C:     104.0 ml LV SV MOD A4C:     268.0 ml LV SV MOD BP:      107.0 ml RIGHT VENTRICLE RV S prime:     7.01 cm/s TAPSE (M-mode): 1.5 cm LEFT ATRIUM             Index       RIGHT ATRIUM           Index LA diam:        5.20 cm 2.48 cm/m  RA Area:     23.40 cm LA Vol (A2C):   91.5 ml 43.65 ml/m RA Volume:   73.60 ml  35.11 ml/m LA Vol (A4C):   83.3 ml 39.74 ml/m LA Biplane Vol: 87.1 ml 41.55 ml/m  AORTIC VALVE LVOT Vmax:   82.50 cm/s LVOT Vmean:  45.100 cm/s LVOT VTI:    0.091 m  AORTA Ao Root diam: 3.60 cm MITRAL VALVE                 TRICUSPID VALVE MV Area (PHT): 4.46 cm      TR Peak grad:   37.5 mmHg MV Decel Time: 170 msec      TR Vmax:        306.00 cm/s MR Peak grad:    69.2 mmHg MR Mean grad:    49.0 mmHg   SHUNTS MR Vmax:         416.00 cm/s Systemic VTI:  0.09 m MR Vmean:        337.0 cm/s  Systemic Diam: 2.60 cm MR PISA:         3.08 cm MR PISA Eff ROA: 25 mm MR PISA Radius:  0.70 cm MV E velocity: 94.07 cm/s Eleonore Chiquito MD Electronically signed by Eleonore Chiquito MD Signature Date/Time: 11/08/2020/1:06:53 PM    Final     Microbiology: Recent Results (from the past 240 hour(s))  Resp Panel by RT-PCR (Flu A&B, Covid) Urine, Clean Catch     Status: None   Collection Time: 11/05/2020  4:11 PM   Specimen: Urine, Clean Catch; Nasopharyngeal(NP) swabs in vial transport medium  Result Value Ref Range Status   SARS Coronavirus 2 by RT PCR NEGATIVE NEGATIVE Final    Comment: (NOTE) SARS-CoV-2 target nucleic acids are NOT DETECTED.  The SARS-CoV-2 RNA is generally detectable in upper respiratory specimens during the acute phase of infection. The lowest concentration of SARS-CoV-2 viral copies this assay can detect is 138 copies/mL. A negative result does not preclude SARS-Cov-2 infection and should not be used as the sole basis for treatment or other patient management decisions. A negative result may occur with  improper specimen collection/handling, submission of specimen other than  nasopharyngeal swab, presence of viral mutation(s) within the areas targeted  by this assay, and inadequate number of viral copies(<138 copies/mL). A negative result must be combined with clinical observations, patient history, and epidemiological information. The expected result is Negative.  Fact Sheet for Patients:  EntrepreneurPulse.com.au  Fact Sheet for Healthcare Providers:  IncredibleEmployment.be  This test is no t yet approved or cleared by the [place] FDA and  has been authorized for detection and/or diagnosis of SARS-CoV-2 by FDA under an Emergency Use Authorization (EUA). This EUA will remain  in effect (meaning this test can be used) for the duration of the COVID-19 declaration under Section 564(b)(1) of the Act, 21 U.S.C.section 360bbb-3(b)(1), unless the authorization is terminated  or revoked sooner.       Influenza A by PCR NEGATIVE NEGATIVE Final   Influenza B by PCR NEGATIVE NEGATIVE Final    Comment: (NOTE) The Xpert Xpress SARS-CoV-2/FLU/RSV plus assay is intended as an aid in the diagnosis of influenza from Nasopharyngeal swab specimens and should not be used as a sole basis for treatment. Nasal washings and aspirates are unacceptable for Xpert Xpress SARS-CoV-2/FLU/RSV testing.  Fact Sheet for Patients: EntrepreneurPulse.com.au  Fact Sheet for Healthcare Providers: IncredibleEmployment.be  This test is not yet approved or cleared by the [place] FDA and has been authorized for detection and/or diagnosis of SARS-CoV-2 by FDA under an Emergency Use Authorization (EUA). This EUA will remain in effect (meaning this test can be used) for the duration of the COVID-19 declaration under Section 564(b)(1) of the Act, 21 U.S.C. section 360bbb-3(b)(1), unless the authorization is terminated or revoked.  Performed at [facility], [place] [address] [place], [place] [address]    Blood Culture (routine x 2)     Status: None (Preliminary result)   Collection Time: 10/29/2020  8:29 PM   Specimen: Site Not Specified; Blood  Result Value Ref Range Status   Specimen Description SITE NOT SPECIFIED  Final   Special Requests   Final    BOTTLES DRAWN AEROBIC AND ANAEROBIC Blood Culture results may not be optimal due to an inadequate volume of blood received in culture bottles   Culture   Final    NO GROWTH 3 DAYS Performed at [facility], [place] [address], [place], [place] [address]    Report Status PENDING  Incomplete  Blood Culture (routine x 2)     Status: None (Preliminary result)   Collection Time: 11/08/2020  8:29 PM   Specimen: BLOOD  Result Value Ref Range Status   Specimen Description BLOOD RIGHT ANTECUBITAL  Final   Special Requests   Final    BOTTLES DRAWN AEROBIC AND ANAEROBIC Blood Culture adequate volume   Culture   Final    NO GROWTH 3 DAYS Performed at [facility], [place] [address], [place], [place] [address]    Report Status PENDING  Incomplete  MRSA PCR Screening     Status: None   Collection Time: 11/08/20  6:57 PM   Specimen: Nasopharyngeal  Result Value Ref Range Status   MRSA by PCR NEGATIVE NEGATIVE Final    Comment:        The GeneXpert MRSA Assay (FDA approved for NASAL specimens only), is one component of a comprehensive MRSA colonization surveillance program. It is not intended to diagnose MRSA infection nor to guide or monitor treatment for MRSA infections. Performed at [facility], [place] [address], [place], [place] [address]      Labs: Basic Metabolic Panel: Recent Labs  Lab 10/19/2020 1449 10/30/2020  1533 10/15/2020 2000 11/10/2020 2212 11/08/20 0146 11-27-20 0109  NA 134* 136 134*  --  135 136  K 3.8 3.7 4.0  --  4.4 5.3*  CL 101 100  --   --  101 103  CO2 22  --   --   --  22 21*  GLUCOSE 182* 185*  --   --  169* 138*  BUN 18 20  --   --  19 40*  CREATININE 0.89 0.80  --   --  1.01 1.61*  CALCIUM 8.8*   --   --   --  9.0 8.6*  MG  --   --   --  1.9  --  2.1   Liver Function Tests: Recent Labs  Lab 11/13/2020 1449 11/27/20 0109  AST 30 248*  ALT 116* 191*  ALKPHOS 182* 177*  BILITOT 1.1 1.4*  PROT 6.9 6.4*  ALBUMIN 2.5* 2.4*   No results for input(s): LIPASE, AMYLASE in the last 168 hours. Recent Labs  Lab 27-Nov-2020 0109  AMMONIA 38*   CBC: Recent Labs  Lab 11/11/2020 1449 10/23/2020 1533 11/06/2020 2000 11/08/20 0146 11/27/20 0109  WBC 9.0  --   --  8.6 6.3  NEUTROABS 6.7  --   --   --  5.3  HGB 10.5* 11.9* 11.2* 11.0* 10.6*  HCT 34.0* 35.0* 33.0* 34.3* 33.7*  MCV 93.9  --   --  92.5 94.1  PLT 369  --   --  382 362   Cardiac Enzymes: No results for input(s): CKTOTAL, CKMB, CKMBINDEX, TROPONINI in the last 168 hours. D-Dimer Recent Labs    11/04/2020 2212  DDIMER 4.47*   BNP: Invalid input(s): POCBNP CBG: Recent Labs  Lab 11/08/20 2121 2020-11-27 0748 2020-11-27 1119 11/27/2020 1618 Nov 27, 2020 2111  GLUCAP 151* 195* 265* 275* 222*   Anemia work up Recent Labs    Nov 27, 2020 0109  VITAMINB12 1,859*  FOLATE 26.9   Urinalysis    Component Value Date/Time   COLORURINE YELLOW 10/16/2020 [place] 11/13/2020 1647   LABSPEC 1.018 11/06/2020 1647   PHURINE 5.0 10/15/2020 1647   GLUCOSEU NEGATIVE 10/19/2020 1647   GLUCOSEU NEGATIVE 11/15/2014 1003   HGBUR NEGATIVE 10/22/2020 1647   [place] 10/15/2020 1647   [place] 10/29/2020 1647   PROTEINUR 100 (A) 10/20/2020 1647   UROBILINOGEN 0.2 11/15/2014 1003   NITRITE NEGATIVE 10/26/2020 1647   LEUKOCYTESUR NEGATIVE 11/08/2020 1647   Sepsis Labs Invalid input(s): PROCALCITONIN,  WBC,  LACTICIDVEN     SIGNED:  Aline August, MD  [facility] 11/10/2020, 11:32 AM

## 2020-11-14 NOTE — TOC Initial Note (Signed)
Transition of Care [facility]) - Initial/Assessment Note    Patient Details  Name: [name] MRN: [id number] Date of Birth: 1945/10/08  Transition of [facility]) CM/SW Contact:    Carles Collet, RN Phone Number: 11-25-2020, 4:23 PM  Clinical Narrative:       Met with patient at bedside to discuss DC plan. Recommendations are for SNF. Patient states that he lives at home alone, was found down after being on the floor all night long.  He on supplemental oxygen, does not have oxygen at home. He lives at home alone and has a friend [name] who checks in him. He is against long term care in a nursing home and wants to know how much going to a nursing home is going to cost. He is argumentative about transitional care in a nursing home repeatedly citing he needs to know what it is going to cost before he will talk about it. I informed him it would be processed through his insurance but he was unwilling to discuss it further stating "you can't trust Medicare."  TOC will continue to follow              Expected Discharge Plan: Skilled Nursing Facility Barriers to Discharge: Continued Medical Work up   Patient Goals and CMS Choice Patient states their goals for this hospitalization and ongoing recovery are:: patient is unsure of DC plan      Expected Discharge Plan and Services Expected Discharge Plan: [place] In-house Referral: Clinical Social Work Discharge Planning Services: CM Consult   Living arrangements for the past 2 months: [place]                                      Prior Living Arrangements/Services Living arrangements for the past 2 months: Single Family Home Lives with:: Self                   Activities of Daily Living   ADL Screening (condition at time of admission) Patient's cognitive ability adequate to safely complete daily activities?: Yes Is the patient deaf or have difficulty hearing?: No Does the patient have difficulty  seeing, even when wearing glasses/contacts?: No Does the patient have difficulty concentrating, remembering, or making decisions?: Yes Patient able to express need for assistance with ADLs?: Yes Does the patient have difficulty dressing or bathing?: Yes Independently performs ADLs?: No Communication: Independent Dressing (OT): Needs assistance Is this a change from baseline?: Change from baseline, expected to last <3days Grooming: Independent Feeding: Independent Bathing: Needs assistance Is this a change from baseline?: Change from baseline, expected to last <3 days Toileting: Needs assistance Is this a change from baseline?: Change from baseline, expected to last <3 days In/Out Bed: Needs assistance Is this a change from baseline?: Change from baseline, expected to last <3 days Walks in Home: Needs assistance Is this a change from baseline?: Change from baseline, expected to last <3 days Does the patient have difficulty walking or climbing stairs?: Yes Weakness of Legs: Both Weakness of Arms/Hands: Both  Permission Sought/Granted                  Emotional Assessment              Admission diagnosis:  Acute respiratory failure with hypoxia ([place]) [J96.01] Patient Active Problem List   Diagnosis Date Noted  . Palliative care by specialist   .  Goals of care, counseling/discussion   . Acute respiratory failure with hypoxia ([place]) 11/01/2020  . Elevated troponin level not due myocardial infarction 10/16/2020  . Pseudomonas infection 10/10/2020  . Type 2 diabetes mellitus with diabetic polyneuropathy, with long-term current use of insulin ([place]) 09/17/2020  . Blurry vision 09/10/2020  . Intertrigo 09/10/2020  . Encephalopathy acute   . Septic arthritis of shoulder ([place]) 08/22/2020  . Osteomyelitis of right humerus ([place]) 08/22/2020  . AKI (acute kidney injury) ([place]) 08/21/2020  . Sepsis due to group B Streptococcus ([place])   . Pyogenic arthritis of left shoulder region  ([place])   . Acute respiratory failure ([place]) 07/12/2020  . Shortness of breath   . Status post aortobifemoral bypass surgery   . History of carotid endarterectomy   . Mixed diabetic hyperlipidemia associated with type 2 diabetes mellitus ([place])   . Diabetes mellitus type II, non insulin dependent ([place])   . Septic shock ([place]) 07/09/2020  . Decubitus ulcer 05/12/2020  . Amputation of right middle finger 05/12/2020  . Long term (current) use of opiate analgesic 05/07/2020  . Pain in left foot 05/07/2020  . Neck pain 05/07/2020  . Chronic systolic congestive heart failure, NYHA class 3 ([place]) 01/08/2020  . Constipation 01/07/2020  . Coronary artery disease involving native heart without angina pectoris 01/03/2020  . Hyperlipidemia associated with type 2 diabetes mellitus ([place]) 01/03/2020  . S/P coronary artery stent placement 01/03/2020  . Major depressive disorder, recurrent episode, severe, with psychotic behavior ([place]) 12/29/2019  . Delusional disorder ([place]) 12/29/2019  . NSTEMI (non-ST elevated myocardial infarction) ([place]) 05/19/2019  . MRSA infection 01/18/2019  . Occipital neuralgia 01/18/2019  . Osteomyelitis of left foot ([place]) 01/18/2019  . Type 2 diabetes mellitus with diabetic peripheral angiopathy without gangrene, without long-term current use of insulin ([place]) 01/18/2019  . Acute on chronic combined systolic and diastolic CHF (congestive heart failure) ([place]) 01/15/2019  . Varus foot deformity, acquired, left 11/28/2018  . Generalized anxiety disorder 07/03/2018  . Restless leg syndrome 07/03/2018  . Paranoia ([place]) 07/03/2018  . PTSD (post-traumatic stress disorder) 05/29/2018  . Abscess of right lower leg   . Severe protein-calorie malnutrition ([place])   . 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 Diabetic polyneuropathy associated with type 2 diabetes mellitus ([place])   . Cellulitis 04/01/2018  . Cellulitis of right lower extremity  04/01/2018  . Tachycardia 04/01/2018  . Hx of CABG 09/22/2015  . Unstable angina ([place]) 09/20/2015  . Chest pain, rule out acute myocardial infarction 09/18/2015  . Diabetes mellitus type 2 with peripheral artery disease ([place]) 09/18/2015  . Hyponatremia 09/18/2015  . Chest pain on exertion 09/18/2015  . Varicose veins of bilateral lower extremities with other complications 33/00/7622  . Occlusion and stenosis of carotid artery without mention of cerebral infarction 10/18/2011  . SINUSITIS, ACUTE 03/13/2010  . BACK PAIN 11/13/2009  . FATIGUE 10/17/2009  . Diabetic neuropathy associated with diabetes mellitus due to underlying condition ([place]) 08/29/2009  . Vitamin D deficiency 08/29/2009  . VISUAL ACUITY, DECREASED, LEFT EYE 08/29/2009  . Peripheral artery disease ([place]) 08/29/2009  . SINUSITIS, CHRONIC 08/29/2009  . NEPHROLITHIASIS 08/29/2009  . FIBROMYALGIA 08/29/2009  . BICEPS TENDON RUPTURE, RIGHT 08/11/2009  . ANEMIA-NOS 08/05/2009  . Anxiety state 08/05/2009  . DEPRESSION 08/05/2009  . Chronic pain syndrome 08/05/2009  . Essential hypertension 08/05/2009  . GERD without esophagitis 08/05/2009  . BPH (benign prostatic hyperplasia) 08/05/2009  . [place] DISEASE, LUMBAR 08/05/2009  .  Osteoporosis 08/05/2009   PCP:  Doreatha Lew, MD Pharmacy:   [facility], [address] [place] [address] Phone: [phone] Fax: [phone]     Social Determinants of Health (SDOH) Interventions    Readmission Risk Interventions No flowsheet data found.

## 2020-11-14 NOTE — Consult Note (Addendum)
CARDIOLOGY CONSULT NOTE  Patient ID: Abas Leicht MRN: [id number] DOB/AGE: Jan 11, 1946 75 y.o.  Admit date: 11/04/2020 Referring Physician: [facility] hospitalist Reason for Consultation:  HFrEF  HPI:   75 y.o. Caucasian male  with hypertension, hyperlipidemia, type 2 diabetes mellitus, CAD s/p CABG 2017, PAD s/p aortofemoral bypass 2012, s/p left carotid endarterectomy 2012, now admitted with acute hypoxic respiratory failure, found to have new acute on chronic systolic heart failure.  Patient has hd multiple recent hospitalizations, including GBS septic arthritis, osetomyelitis Rt humeral head, septic shock, further complicated by AKI requiring dialysis, encephalopathy. Patient eventually came off dialysis and was following with ID outpatient.   Over the past two weeks, he has had worsening shortness of breath, leg edema, productive cough with dark colored sputum. On the day of admission, he was found down on the floor, neighbor called EMS. On arrival to ED, patient was hypotensive at 80/40 mmHg, hypoxics in 80s. Workup is significant for BNP >2000, Cr 1.6 (up from 1.0), elevated liver enzymes, trop HS 70. Echocardiogram showed new systolic heart failure, EF 25-30%. Previous known EF in 06/2020 was normal.  Patient tells me that he has had diffuse constant pain across his chest for last several days.  He can barely walk 30-40 feet without getting short of breath.  He is getting generalized weakness and unable to support himself easily.  Of note, patient had unsuccessful left main into subtotally occluded left circumflex PCIX2 at [facility] in 11/2019.  Echocardiogram at [facility] in 12/2019 showed EF of 40%.  EF was noted to be 50-55% on echocardiogram at [facility] in 06/2020.  Patient currently lives home by himself.  He is not in touch with his children.  His only friend/caretaker is will be, with whom he is thinking of making his healthcare power of attorney.  There has been discussion regarding DNR status,  which he agrees with.  He tells me that palliative care is coming to his place later this week.  Review of Systems:    Past Medical History:  Diagnosis Date  . Acute renal failure (ARF) ([place]) 08/21/2020  . Anemia    NOS  . Anxiety   . 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 Blurry vision 09/10/2020  . BPH (benign prostatic hypertrophy)   . Broken foot Oct. 12, 2013   Right foot Fx  . CAD (coronary artery disease)   . Carotid stenosis, left   . Cellulitis and abscess of leg 03/2018   right leg  . CHF (congestive heart failure) ([place])   . Chronic hyponatremia   . Chronic pain syndrome   . COPD (chronic obstructive pulmonary disease) ([place])   . Depression   . Diabetes mellitus   . Diabetic foot ulcer ([place])   . DJD (degenerative joint disease)    L wrist  . DM2 (diabetes mellitus, type 2) ([place])   . GERD (gastroesophageal reflux disease)   . HLD (hyperlipidemia)   . HTN (hypertension)   . Hx of blood transfusion reaction   . Hyponatremia   . Lumbar disc disease   . MI (mitral incompetence)   . Myocardial infarction ([place])    x 2  . Osteoporosis    tx per [facility], Dr Prudencio Burly, thought due to heavy steriod use after 1978  . Peripheral neuropathy   . Peripheral vascular disease ([place])   . Pseudomonas infection 10/10/2020  . Restless leg syndrome   . Spine fracture    hx, multiple  .  Varicose veins   . 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 Vitamin D deficiency      Past Surgical History:  Procedure Laterality Date  . ANGIOPLASTY    . aorta bifemoral bypass grafting  09/2010  . CARDIAC CATHETERIZATION N/A 09/19/2015   Procedure: Left Heart Cath and Coronary Angiography;  Surgeon: Adrian Prows, MD;  Location: [place] [facility];  Service: Cardiovascular;  Laterality: N/A;  . CARPAL TUNNEL RELEASE     right 2006/ left 2007  . CATARACT EXTRACTION     left 1996/ right 1997  . CORONARY ARTERY BYPASS GRAFT N/A 09/22/2015   Procedure: CORONARY ARTERY  BYPASS GRAFTING (CABG) times four using the right greater saphenous vein harvested endoscopically and the left internal mammary artery.  LIMA-LAD, SEQ SVG-DIAG & OM, SVG-PD.;  Surgeon: [name], MD;  Location: [place];  Service: Open Heart Surgery;  Laterality: N/A;  . I & D EXTREMITY Right 04/07/2018   Procedure: IRRIGATION AND DEBRIDEMENT ABSCESS RIGHT LEG;  Surgeon: Newt Minion, MD;  Location: [place];  Service: Orthopedics;  Laterality: Right;  . INCISION AND DRAINAGE OF WOUND Right 05/07/2020   Procedure: RIGHT LONG FINGER IRRIGATION AND DEBRIDEMENT AND AMPUTATION;  Surgeon: Iran Planas, MD;  Location: [place];  Service: Orthopedics;  Laterality: Right;  with IV sedation  . INCISION AND DRAINAGE OF WOUND Right 05/12/2020   Procedure: IRRIGATION AND DEBRIDEMENT WOUND and possible revision amputation;  Surgeon: Iran Planas, MD;  Location: [place];  Service: Orthopedics;  Laterality: Right;  needs 60 minutes  . IR FLUORO GUIDE CV LINE LEFT  08/25/2020  . IR FLUORO GUIDE CV LINE RIGHT  07/18/2020  . IR REMOVAL TUN CV CATH W/O FL  10/01/2020  . IR US GUIDE VASC ACCESS LEFT  08/25/2020  . IR US GUIDE VASC ACCESS RIGHT  07/18/2020  . L foot open repair jones fracture  2010    5th metetarsal   . left ankle ganglion cyst  1976  . left carotid endarterectomy  03/2011  . left cataract  1996   right - 1997  . left CTS  2006   R CTS - 2007  . left foot surgery  1998   R surgery/fracture - 1999  . left plantar ganglion cystectomy  1979  . left wrist/hand fusion  2008  . [place], 2004  . NASAL SINUS SURGERY     multiple- x8. last 1997 with obliteration  . REPAIR THORACIC AORTA  2017  . right hand fracture  1969  . ROTATOR CUFF REPAIR  2006   R, than repeat 2011, Dr Theda Sers  . SHOULDER ARTHROSCOPY Bilateral 07/17/2020   Procedure: Right shoulder arthroscopic lavage; Left shoulder aspiration.;  Surgeon: Justice Britain, MD;  Location: [facility];  Service: Orthopedics;  Laterality:  Bilateral;  131mn  . SHOULDER ARTHROSCOPY WITH LABRAL REPAIR Left 07/10/2020   Procedure: SHOULDER ARTHROSCOPY WITH WASHOUT;  Surgeon: SJustice Britain MD;  Location: [facility];  Service: Orthopedics;  Laterality: Left;  . TRIGGER FINGER RELEASE Left 11/11/2014   Procedure: LEFT LONG FINGER RELEASE TRIGGER FINGER/A-1 PULLEY;  Surgeon: DMilly Jakob MD;  [place]  Service: Orthopedics;  Laterality: Left;  . vocal surgery  1996      Family History  Problem Relation Age of Onset  . Lung cancer Father 542 . Diabetes Mother 786 . Osteoporosis Mother   . Depression Mother   . Bleeding Disorder Mother   .  Throat cancer Other        family hx - "bad living" - also lung CA, heart disease and renal failure    . Diabetes Sister   . Suicidality Son 27  . Lung cancer Maternal Grandmother 22  . Diabetes Maternal Grandfather 37  . Heart disease Maternal Grandfather   . Heart attack Paternal Grandmother   . Brain cancer Paternal Grandfather 74     Social History: Social History   Socioeconomic History  . Marital status: Divorced    Spouse name: Not on file  . Number of children: Not on file  . Years of education: Not on file  . Highest education level: Not on file  Occupational History  . Not on file  Tobacco Use  . Smoking status: Former Smoker    Packs/day: 1.50    Years: 30.00    Pack years: 45.00    Types: Cigarettes    Quit date: 11/06/1988    Years since quitting: 32.0  . Smokeless tobacco: Never Used  Vaping Use  . Vaping Use: Never used  Substance and Sexual Activity  . Alcohol use: Not Currently    Comment: None in 30 years but drank beer before- 05/09/20  . Drug use: No  . Sexual activity: Not Currently  Other Topics Concern  . Not on file  Social History Narrative   Social History      Diet? I eat about what I want.       Do you drink/eat things with caffeine? yes      Marital status?                  divorced                  What year  were you married? 1966 to 2007      Do you live in a house, apartment, assisted living, condo, trailer, etc.? [place]      Is it one or more stories? one      How many persons live in your home? Me only      Do you have any pets in your home? (please list) no      Highest level of education completed? Finished high school      Current or past profession: Clinical biochemist in [place] you exercise?               no                       Type & how often?      Advanced Directives      Do you have a living will? no      Do you have a DNR form?     no                             If not, do you want to discuss one?      Do you have signed POA/HPOA for forms? No      Functional Status      Do you have difficulty bathing or dressing yourself? No- shower      Do you have difficulty preparing food or eating? No- Don't want to      Do you have difficulty managing your medications?      Do you have difficulty managing your finances?  Do you have difficulty affording your medications? No- paid for by [place] Strain: Not on [employer] Insecurity: Not on file  Transportation Needs: Not on file  Physical Activity: Not on file  Stress: Not on file  Social Connections: Not on file  Intimate Partner Violence: Not on file     Medications Prior to Admission  Medication Sig Dispense Refill Last Dose  . Alirocumab (PRALUENT) 75 MG/ML SOAJ Inject 75 mg into the skin every 14 (fourteen) days.     . clonazePAM (KLONOPIN) 0.5 MG tablet Take 0.5 mg by mouth 2 (two) times daily as needed for anxiety.     . gabapentin (NEURONTIN) 400 MG capsule Take 400 mg by mouth as directed.     . ticagrelor (BRILINTA) 90 MG TABS tablet Take 90 mg by mouth 2 (two) times daily.     Marland Kitchen ACCU-CHEK FASTCLIX LANCETS MISC Use as directed to check blood glucose. Dx: E11.51 102 each 6   . acetaminophen (TYLENOL) 500 MG tablet Take 1,000 mg by  mouth every 6 (six) hours as needed for mild pain, fever or headache.     . albuterol (VENTOLIN HFA) 108 (90 Base) MCG/ACT inhaler Inhale 2 puffs into the lungs every 6 (six) hours as needed for wheezing or shortness of breath.     . ARIPiprazole (ABILIFY) 2 MG tablet Take 4 mg by mouth in the morning.     Marland Kitchen aspirin EC 81 MG tablet Take 1 tablet (81 mg total) by mouth daily. 30 tablet 0   . Blood Glucose Monitoring Suppl (ACCU-CHEK AVIVA PLUS) w/Device KIT Use as directed to check blood glucose daily. Dx: E11.51 1 kit 0   . buPROPion (WELLBUTRIN XL) 300 MG 24 hr tablet Take 1 tablet by mouth once daily 90 tablet 0   . carvedilol (COREG) 3.125 MG tablet Take 3.125 mg by mouth 2 (two) times daily.     . cephALEXin (KEFLEX) 500 MG capsule Take 1 capsule (500 mg total) by mouth 2 (two) times daily. 60 capsule 11   . Cholecalciferol (VITAMIN D3) 5000 units TABS Take 5,000 Units by mouth daily.     Marland Kitchen ENTRESTO 49-51 MG Take 1 tablet by mouth 2 (two) times daily.     . furosemide (LASIX) 20 MG tablet Take 20 mg by mouth 2 (two) times daily.     Marland Kitchen glucose blood (ACCU-CHEK AVIVA PLUS) test strip 1 each by Other route as needed for other. Use as instructed. Dx: E11.51 100 each 6   . insulin aspart (NOVOLOG) 100 UNIT/ML injection Inject 0-9 Units into the skin 3 (three) times daily with meals. 10 mL 11   . IRON PO Take 1 capsule by mouth daily.     . isosorbide mononitrate (IMDUR) 60 MG 24 hr tablet Take 60 mg by mouth daily.     Marland Kitchen JANUVIA 25 MG tablet Take 25 mg by mouth daily.     . Multiple Vitamin (MULTIVITAMIN WITH MINERALS) TABS tablet Take 1 tablet by mouth daily.     Marland Kitchen NARCAN 4 MG/0.1ML LIQD nasal spray kit Place 4 mg into the nose as needed (opioid reversal).     . nitroGLYCERIN (NITROSTAT) 0.4 MG SL tablet PLACE 1 TABLET UNDER THE TONGUE EVERY 5 MINUTES AS NEEDED FOR CHEST PAIN (Patient taking differently: Place 0.4 mg under the tongue every 5 (five) minutes as needed for chest pain.) 25 tablet 2    .  nystatin (MYCOSTATIN/NYSTOP) powder Apply 1 application topically 3 (three) times daily. 15 g 0   . pantoprazole (PROTONIX) 40 MG tablet Take 1 tablet (40 mg total) by mouth 2 (two) times daily.     Marland Kitchen rOPINIRole (REQUIP) 1 MG tablet Take 1 tablet (1 mg total) by mouth in the morning and at bedtime. 180 tablet 0   . tamsulosin (FLOMAX) 0.4 MG CAPS capsule Take 1 capsule (0.4 mg total) by mouth 2 (two) times daily. 180 capsule 0     Review of Systems  Constitutional: Positive for malaise/fatigue. Negative for decreased appetite, weight gain and weight loss.  HENT: Negative for congestion.   Eyes: Negative for visual disturbance.  Cardiovascular: Positive for chest pain and dyspnea on exertion. Negative for leg swelling, palpitations and syncope.  Respiratory: Positive for shortness of breath. Negative for cough.   Endocrine: Negative for cold intolerance.  Hematologic/Lymphatic: Does not bruise/bleed easily.  Skin: Negative for itching and rash.  Musculoskeletal: Negative for myalgias.  Gastrointestinal: Negative for abdominal pain, nausea and vomiting.  Genitourinary: Negative for dysuria.  Neurological: Negative for dizziness and weakness.  Psychiatric/Behavioral: The patient is not nervous/anxious.   All other systems reviewed and are negative.     Physical Exam: Physical Exam Vitals and nursing note reviewed.  Constitutional:      General: He is not in acute distress.    Appearance: He is well-developed.  HENT:     Head: Normocephalic and atraumatic.  Eyes:     Conjunctiva/sclera: Conjunctivae normal.     Pupils: Pupils are equal, round, and reactive to light.  Neck:     Vascular: No JVD.  Cardiovascular:     Rate and Rhythm: Normal rate and regular rhythm.     Pulses: Intact distal pulses.          Femoral pulses are 1+ on the right side and 2+ on the left side.      Popliteal pulses are 1+ on the right side and 1+ on the left side.       Dorsalis pedis pulses are 0  on the right side and 0 on the left side.       Posterior tibial pulses are 0 on the right side and 0 on the left side.     Heart sounds: No murmur heard.   Pulmonary:     Effort: Pulmonary effort is normal.     Breath sounds: Normal breath sounds. No wheezing or rales.  Abdominal:     General: Bowel sounds are normal.     Palpations: Abdomen is soft.     Tenderness: There is no rebound.  Musculoskeletal:        General: No tenderness. Normal range of motion.     Right lower leg: Edema (2+) present.     Left lower leg: Edema (1+) present.  Lymphadenopathy:     Cervical: No cervical adenopathy.  Skin:    General: Skin is warm and dry.  Neurological:     Mental Status: He is alert and oriented to person, place, and time.     Cranial Nerves: No cranial nerve deficit.      Labs:   Lab Results  Component Value Date   WBC 6.3 Nov 19, 2020   HGB 10.6 (L) Nov 19, 2020   HCT 33.7 (L) 19-Nov-2020   MCV 94.1 11/19/2020   PLT 362 11-19-2020    Recent Labs  Lab 11-19-20 0109  NA 136  K 5.3*  CL 103  CO2 21*  BUN  40*  CREATININE 1.61*  CALCIUM 8.6*  PROT 6.4*  BILITOT 1.4*  ALKPHOS 177*  ALT 191*  AST 248*  GLUCOSE 138*    Lipid Panel     Component Value Date/Time   CHOL 139 03/21/2018 0810   TRIG 71 03/21/2018 0810   HDL 37 (L) 03/21/2018 0810   CHOLHDL 3.8 03/21/2018 0810   VLDL 17.0 11/15/2014 1003   LDLCALC 86 03/21/2018 0810    BNP (last 3 results) Recent Labs    08/22/20 2201 09/17/20 1858 11/01/2020 1451  BNP 1,652.4* 2,414.5* 2,212.5*    HEMOGLOBIN A1C Lab Results  Component Value Date   HGBA1C 7.6 (H) 10/20/2020   MPG 171.42 11/03/2020    Cardiac Panel (last 3 results) No results for input(s): CKTOTAL, CKMB, RELINDX in the last 8760 hours.  Invalid input(s): TROPONINHS  No results found for: CKTOTAL, CKMB, CKMBINDEX   TSH Recent Labs    07/09/20 2356 December 06, 2020 0109  TSH 0.996 2.409      Radiology: DG Chest 1 View  Result Date:  11/06/2020 CLINICAL DATA:  Shortness of breath EXAM: CHEST  1 VIEW COMPARISON:  September 17, 2020 FINDINGS: There is extensive airspace opacity throughout the lungs bilaterally with the greatest concentration of airspace opacity in each mid lung and left base regions. Apparent granuloma medial right upper lobe toward the apex. There is cardiomegaly with pulmonary venous hypertension. Patient is status post coronary artery bypass grafting. No adenopathy. Degenerative change in each shoulder. Surgical clips left neck region. IMPRESSION: Multifocal airspace opacity concerning for multifocal pneumonia. A degree of pulmonary edema could present similarly. There is cardiomegaly with pulmonary vascular congestion. Status post coronary artery bypass grafting. Apparent granuloma right upper lobe toward the apex. Electronically Signed   By: Lowella Grip III M.D.   On: 10/21/2020 15:15   CT HEAD WO CONTRAST  Result Date: 11/06/2020 CLINICAL DATA:  Nonspecific dizziness. EXAM: CT HEAD WITHOUT CONTRAST TECHNIQUE: Contiguous axial images were obtained from the base of the skull through the vertex without intravenous contrast. COMPARISON:  MRI 08/31/2020 FINDINGS: Brain: No focal abnormality affects the brainstem or cerebellum. Mild cerebellar atrophy. Cerebral hemispheres show moderate atrophy with frontal lobe predominance. There chronic small-vessel ischemic changes of the white matter, frontal predominant. No large vessel territory infarction. No mass lesion, hemorrhage, hydrocephalus or extra-axial collection. Vascular: There is atherosclerotic calcification of the major vessels at the base of the brain. Skull: No acute finding. Sinuses/Orbits: Extensive widespread sinus surgery in the past. Widespread mucosal thickening. Opacification of the frontal sinuses. Other: None IMPRESSION: 1. No acute finding by CT. Brain atrophy with frontal lobe predominance. Chronic small-vessel ischemic changes of the white matter. 2.  Extensive widespread sinus surgery in the past. Widespread mucosal thickening. Opacification of the frontal sinuses. Electronically Signed   By: Nelson Chimes M.D.   On: 10/18/2020 17:28   CT CHEST WO CONTRAST  Result Date: 11/02/2020 CLINICAL DATA:  Respiratory failure following fall, initial encounter EXAM: CT CHEST WITHOUT CONTRAST TECHNIQUE: Multidetector CT imaging of the chest was performed following the standard protocol without IV contrast. COMPARISON:  Chest x-ray from earlier in the same day. FINDINGS: Cardiovascular: Thoracic aorta demonstrates atherosclerotic calcifications and findings consistent with prior cardiac bypass grafting. Cardiac shadow is enlarged. No pericardial effusion is seen. The pulmonary artery as visualized is within normal limits. Mediastinum/Nodes: Thoracic inlet is unremarkable. Multiple small hilar and mediastinal lymph nodes are seen likely reactive in nature. The esophagus is within normal limits. Lungs/Pleura: Lungs demonstrates some chronic  interstitial changes although with diffuse multifocal superimposed acute infiltrates bilaterally. Pleural effusions are seen bilaterally as well. Upper Abdomen: Visualized upper abdomen shows left renal cyst. Musculoskeletal: Significant degenerative changes of the shoulder joints are noted bilaterally with joint effusions. Degenerative change of the thoracic spine is seen. IMPRESSION: Diffuse multifocal infiltrates bilaterally superimposed over more chronic interstitial changes. This is most consistent with multifocal pneumonia although edema could have this appearance as well. Small bilateral pleural effusions. Reactive lymphadenopathy in the hila and mediastinum. Aortic Atherosclerosis (ICD10-I70.0). Electronically Signed   By: Inez Catalina M.D.   On: 11/10/2020 21:35   CT CERVICAL SPINE WO CONTRAST  Result Date: 10/29/2020 CLINICAL DATA:  Dizziness.  Found on the floor.  Neck pain. EXAM: CT CERVICAL SPINE WITHOUT CONTRAST  TECHNIQUE: Multidetector CT imaging of the cervical spine was performed without intravenous contrast. Multiplanar CT image reconstructions were also generated. COMPARISON:  04/28/2018 FINDINGS: The study suffers from considerable motion degradation. Alignment: No malalignment. Skull base and vertebrae: No visible regional fracture allowing for the motion degradation. Soft tissues and spinal canal: No soft tissue abnormality. Disc levels: Ordinary osteoarthritis at the C1-2 articulation. C2-3 is negative. C3-4: Chronic partially calcified disc protrusion. C4-5: Chronic partially calcified disc herniation. C5-6: Chronic partially calcified disc herniation. C6-7, C7-T1 and T1-2: Negative Upper chest: Pronounced density at the lung apices consistent with pneumonia seen in chest radiography. Other: None IMPRESSION: 1. The study suffers from considerable motion degradation. No visible regional fracture. 2. Chronic partially calcified disc herniations from C3-4 through C5-6. 3. Pronounced density at the lung apices consistent with pneumonia seen in chest radiography. Electronically Signed   By: Nelson Chimes M.D.   On: 10/16/2020 17:31   ECHOCARDIOGRAM COMPLETE  Result Date: 11/08/2020    ECHOCARDIOGRAM REPORT   Patient Name:   GABRIEL CONRY Date of Exam: 11/08/2020 Medical Rec #:  [id number]         Height:       72.0 in Accession #:    [id number]        Weight:       192.5 lb Date of Birth:  12/13/1945         BSA:          2.096 m Patient Age:    88 years          BP:           165/67 mmHg Patient Gender: M                 HR:           116 bpm. Exam Location:  Inpatient Procedure: 2D Echo, Cardiac Doppler, Color Doppler and Intracardiac            Opacification Agent Indications:    CHF-Acute Systolic M19.62  History:        Patient has prior history of Echocardiogram examinations, most                 recent 07/10/2020. CHF, CAD, Prior CABG, COPD and PAD; Risk                 Factors:Hypertension, Dyslipidemia  and Diabetes. GERD.                 Multifocal pneumonia. Acute metabolic encephalopathy. History of                 septic arthritis of bilateral shoulders. Anemia.  Sonographer:    Darlina Sicilian RDCS Referring Phys: [phone]  GEORGE J SHALHOUB IMPRESSIONS  1. Left ventricular ejection fraction, by estimation, is 25 to 30%. The left ventricle has severely decreased function. The left ventricle demonstrates global hypokinesis. The left ventricular internal cavity size was mildly to moderately dilated. Indeterminate diastolic filling due to E-A fusion.  2. Right ventricular systolic function is moderately reduced. The right ventricular size is mildly enlarged. There is moderately elevated pulmonary artery systolic pressure. The estimated right ventricular systolic pressure is 40.7 mmHg.  3. Left atrial size was mildly dilated.  4. Right atrial size was mildly dilated.  5. The mitral valve is grossly normal. Mild to moderate mitral valve regurgitation. No evidence of mitral stenosis.  6. Tricuspid valve regurgitation is moderate.  7. The aortic valve is tricuspid. There is mild calcification of the aortic valve. There is mild thickening of the aortic valve. Aortic valve regurgitation is mild. Mild aortic valve sclerosis is present, with no evidence of aortic valve stenosis.  8. The inferior vena cava is dilated in size with <50% respiratory variability, suggesting right atrial pressure of 15 mmHg. Comparison(s): Changes from prior study are noted. LVEF is now severely reduced ~25-30%. LV cavity is moderately dilated. Moderately elevated RVSP. FINDINGS  Left Ventricle: Left ventricular ejection fraction, by estimation, is 25 to 30%. The left ventricle has severely decreased function. The left ventricle demonstrates global hypokinesis. The left ventricular internal cavity size was mildly to moderately dilated. There is no left ventricular hypertrophy. Indeterminate diastolic filling due to E-A fusion. Right Ventricle:  The right ventricular size is mildly enlarged. No increase in right ventricular wall thickness. Right ventricular systolic function is moderately reduced. There is moderately elevated pulmonary artery systolic pressure. The tricuspid regurgitant velocity is 3.06 m/s, and with an assumed right atrial pressure of 15 mmHg, the estimated right ventricular systolic pressure is 68.0 mmHg. Left Atrium: Left atrial size was mildly dilated. Right Atrium: Right atrial size was mildly dilated. Pericardium: There is no evidence of pericardial effusion. Mitral Valve: The mitral valve is grossly normal. Mild to moderate mitral valve regurgitation. No evidence of mitral valve stenosis. Tricuspid Valve: The tricuspid valve is grossly normal. Tricuspid valve regurgitation is moderate . No evidence of tricuspid stenosis. Aortic Valve: The aortic valve is tricuspid. There is mild calcification of the aortic valve. There is mild thickening of the aortic valve. Aortic valve regurgitation is mild. Mild aortic valve sclerosis is present, with no evidence of aortic valve stenosis. Pulmonic Valve: The pulmonic valve was grossly normal. Pulmonic valve regurgitation is trivial. No evidence of pulmonic stenosis. Aorta: The aortic root is normal in size and structure. Venous: The inferior vena cava is dilated in size with less than 50% respiratory variability, suggesting right atrial pressure of 15 mmHg. IAS/Shunts: The atrial septum is grossly normal.  LEFT VENTRICLE PLAX 2D LVIDd:         6.23 cm LVIDs:         5.89 cm LV PW:         0.90 cm LV IVS:        0.90 cm LVOT diam:     2.60 cm LV SV:         48 LV SV Index:   23 LVOT Area:     5.31 cm  LV Volumes (MOD) LV vol d, MOD A2C: 242.0 ml LV vol d, MOD A4C: 268.0 ml LV vol s, MOD A2C: 138.0 ml LV vol s, MOD A4C: 157.0 ml LV SV MOD A2C:     104.0 ml  LV SV MOD A4C:     268.0 ml LV SV MOD BP:      107.0 ml RIGHT VENTRICLE RV S prime:     7.01 cm/s TAPSE (M-mode): 1.5 cm LEFT ATRIUM              Index       RIGHT ATRIUM           Index LA diam:        5.20 cm 2.48 cm/m  RA Area:     23.40 cm LA Vol (A2C):   91.5 ml 43.65 ml/m RA Volume:   73.60 ml  35.11 ml/m LA Vol (A4C):   83.3 ml 39.74 ml/m LA Biplane Vol: 87.1 ml 41.55 ml/m  AORTIC VALVE LVOT Vmax:   82.50 cm/s LVOT Vmean:  45.100 cm/s LVOT VTI:    0.091 m  AORTA Ao Root diam: 3.60 cm MITRAL VALVE                 TRICUSPID VALVE MV Area (PHT): 4.46 cm      TR Peak grad:   37.5 mmHg MV Decel Time: 170 msec      TR Vmax:        306.00 cm/s MR Peak grad:    69.2 mmHg MR Mean grad:    49.0 mmHg   SHUNTS MR Vmax:         416.00 cm/s Systemic VTI:  0.09 m MR Vmean:        337.0 cm/s  Systemic Diam: 2.60 cm MR PISA:         3.08 cm MR PISA Eff ROA: 25 mm MR PISA Radius:  0.70 cm MV E velocity: 94.07 cm/s Eleonore Chiquito MD Electronically signed by Eleonore Chiquito MD Signature Date/Time: 11/08/2020/1:06:53 PM    Final     Scheduled Meds: . ARIPiprazole  5 mg Oral Daily  . buPROPion  300 mg Oral Daily  . carvedilol  12.5 mg Oral BID WC  . gabapentin  100 mg Oral BID  . insulin aspart  0-15 Units Subcutaneous TID AC & HS  . isosorbide mononitrate  60 mg Oral Daily  . methylPREDNISolone (SOLU-MEDROL) injection  60 mg Intravenous Q6H  . mometasone-formoterol  2 puff Inhalation BID  . pantoprazole (PROTONIX) IV  40 mg Intravenous Q12H  . rOPINIRole  1 mg Oral BID  . tamsulosin  0.4 mg Oral BID  . ticagrelor  90 mg Oral BID   Continuous Infusions: . azithromycin 500 mg (11/08/20 2022)  . cefTRIAXone (ROCEPHIN)  IV 2 g (11/08/20 1949)   PRN Meds:.acetaminophen, albuterol, haloperidol lactate, metoprolol tartrate, nitroGLYCERIN, ondansetron (ZOFRAN) IV  CARDIAC STUDIES:  EKG 11/10/2020: Sinus tachycardia 123 bpm Inferolateral ST depression, consider ischemia  Echocardiogram 10/14/2020: 1. Left ventricular ejection fraction, by estimation, is 25 to 30%. The  left ventricle has severely decreased function. The left ventricle   demonstrates global hypokinesis. The left ventricular internal cavity size  was mildly to moderately dilated.  Indeterminate diastolic filling due to E-A fusion.  2. Right ventricular systolic function is moderately reduced. The right  ventricular size is mildly enlarged. There is moderately elevated  pulmonary artery systolic pressure. The estimated right ventricular  systolic pressure is 88.4 mmHg.  3. Left atrial size was mildly dilated.  4. Right atrial size was mildly dilated.  5. The mitral valve is grossly normal. Mild to moderate mitral valve  regurgitation. No evidence of mitral stenosis.  6. Tricuspid valve regurgitation is moderate.  7. The aortic valve is tricuspid. There is mild calcification of the  aortic valve. There is mild thickening of the aortic valve. Aortic valve  regurgitation is mild. Mild aortic valve sclerosis is present, with no  evidence of aortic valve stenosis.  8. The inferior vena cava is dilated in size with <50% respiratory  variability, suggesting right atrial pressure of 15 mmHg.   Echocardiogram 07/10/2020: 1. Technically difficult study with limited views 2. Left ventricular ejection fraction, by estimation, is 50 to 55%. The left ventricle has low normal function. Left ventricular diastolic parameters were normal. 3. Right ventricular systolic function is normal. The right ventricular size is mildly enlarged. 4. Left atrial size was mildly dilated. 5. The mitral valve is normal in structure. Mild mitral valve regurgitation. 6. The aortic valve is tricuspid. Aortic valve regurgitation is not visualized. Mild to moderate aortic valve sclerosis/calcification is present, without any evidence of aortic stenosis.  Coronary and bypass graft angiogram 11/2019 ([facility]): Indication: NSTEMI  LM: Severe distal left main disease jeopardizing circumflex and proximal LAD LAD: Jeopardized flow through severe distal left main lesion LCx:-Subtotally  occluded at the ostium  LIMA-LAD: Patent SVG-RCA: Occluded SVG-ramus: Patent  Unclear if circumflex is protected or not.  Attempted PCI left main into circumflex unsuccessful x2  Of note 09/2015 Dr. Servando Snare: LIMA-LAD; SEQ SVG-DIAG-OM; SVG-PD  Assessment & Recommendations:  75 y.o. Caucasian male  with hypertension, hyperlipidemia, type 2 diabetes mellitus, CAD s/p CABG 2017, PAD s/p aortofemoral bypass 2012, s/p left carotid endarterectomy 2012, now admitted with acute hypoxic respiratory failure, found to have new acute on chronic systolic heart failure.  Acute on chronic systolic heart failure: Suspect progression of ischemic cardiomyopathy, currently NYHA class IV While his creatinine has increased, I still think he needs further diuresis.  He understands that there is risk of further jeopardizing his renal function.  However, he continues to be short of breath at rest.  Therefore, I recommend IV Lasix 40 mg daily with cautious monitoring of I/O and renal function.  This can be further uptitrated as tolerated.. ACE/ARB/Arni/aldosterone antagonist on hold given worsening renal function. We will readdress during hospital course. Continue carvedilol, currently at 12.5 mg twice daily.  Dose can be reduced, should hypotension ensue. Keep K around 4.5, mag around 2.  Coronary artery disease:  S/p CABG, unsuccessful PCI to left circumflex and 11/2019. Troponin elevation likely type II MI in the setting of obstructive CAD and systolic heart failure.  Do not recommend heparin at this time. Currently on dual antiplatelet therapy with aspirin and Brilinta.  There is some concern for GI bleed.  If there is continued blood loss, could consider stopping Brilinta for now. Continue beta-blocker, Imdur.  PAD: No critical limb ischemia. Continue risk factor management.    Rest of the management of possible pneumonia, COPD exacerbation as per primary team.  Continue monitoring renal  function.  Finally, I support patient's DNR status.  He is currently palliative care only.  Given his multiple hospitalizations and recent worsening of systolic function, as well as low success with prior revascularization attempts, discussion for hospice is also reasonable.  We will continue follow-up during this hospital course.     Nigel Mormon, MD Pager: [phone] Office: [phone]

## 2020-11-14 NOTE — Consult Note (Signed)
Consultation Note Date: 11-12-2020   Patient Name: [name]  DOB: January 21, 1946  MRN: [id number]  Age / Sex: 75 y.o., male  PCP: Doreatha Lew, MD Referring Physician: Aline August, MD  Reason for Consultation: Establishing goals of care  HPI/Patient Profile: 75 y.o. male  with past medical history of chronic systolic and diastolic CHF with EF 79-03%, DM type 2, diabetic polyneuropathy, peripheral artery disease, CAD s/p CABG, GERD, HLD, generalized anxiety disorder, severe prolonged hospitalization in January 2022 for septic shock/septic arthritis/ostemyelitis requiring prolonged antibiotic course and also AKI with temporary dialysis, currently on Keflex outpatient admitted on 10/23/2020 with shortness of breath and fall. Hospital admission for acute hypoxic respiratory failure secondary to multifocal pneumonia receiving antibiotics. Underlying heart failure, medically managing. Generalized weakness and deconditioning, PT consult pending. Palliative medicine consultation for goals of care.   Clinical Assessment and Goals of Care:  I have reviewed medical records, discussed with RN, and initially met with patient at bedside. Shortly after, met with patient and his friend [name]) at bedside to discuss goals of care. [name] is awake, alert, oriented and able to participate in discussion. He denies pain. He is currently on 2L [place] with stable oxygen saturation.   I introduced Palliative Medicine as specialized medical care for people living with serious illness. It focuses on providing relief from the symptoms and stress of a serious illness. The goal is to improve quality of life for both the patient and the family. Patient known to PMT from previous admission. Notes reviewed.   Last admission, it was recommended he discharge to SNF rehab. Patient declined and returned home alone. He spends most of his time in  the chair and uses a wheelchair to get around the house. Patient and [name] confirm he is estranged from his only living son (lives in [place]). [name] is his only support system and has known him for 10 years. She checks in on him a few times a week but not able to provider 24/7 care. [name] shares concerns that it is 'dangerous' for him to be home alone with fall risk.   Discussed events leading up to admission and course of hospitalization including diagnoses, interventions, plan of care. Explained to [name] that he is very weak and will likely require discharge to rehab following this hospitalization.   I attempted to elicit values and goals of care important to the patient. Quality of life and independence is important to [name]. He shares his concern with discharging to SNF rehab and becoming "trapped" there, where they will take all his belongings and money. He states "I don't want to be stuck in a nursing home" and shares he would rather die than live long-term in a nursing home. We discussed concern for his safety and that he may eventually need higher level of care (? ALF). He would be open to hearing about ALF options.  Advanced directives, concepts specific to code status, artifical feeding and hydration were discussed. Introduced and discussed AD packet and MOST form. Patient verbalizes that  he would wish for [name] to make medical decisions on his behalf in unable. [name] is willing to serve as his documented HCPOA. Encouraged completion of this documentation (especially since estranged from son and legally he is default HCPOA with no documentation). Patient confirms his decision for DNR code status and that he would not wish for prolonged measures if it would mean he would end up dependent in a nursing home. [name] understands and respects his decision for DNR. Encouraged MOST form completion this admission in order to clearly document his wishes. They are not ready to complete today.    Discussed plan of  care. Watchful waiting. PT and TOC involvement.   Questions and concerns were addressed.  Hard Choices booklet and PMT contact information given to [name].    SUMMARY OF RECOMMENDATIONS    Continue current plan of care and medical management.  PT/OT efforts when appropriate. Passed SLP evaluation and tolerating diet.  Patient is estranged from his son. He verbalizes wish for friend [name]) to serve as Economist. [name] is willing to serve as HCPOA. Reviewed AD packet and MOST form. Encouraged completion this admission. They are not ready to complete documentation today.  Likely will need SNF rehab on discharge. Patient's quality of life and independence is important to him but it is becoming harder to care for himself at home. Patient interested in discussing ALF options with [facility] team.   PMT will continue to follow this admit. Recommend outpatient palliative f/u on discharge.   Code Status/Advance Care Planning:  DNR: confirmed by patient.  Symptom Management:   Per attending  Palliative Prophylaxis:   Aspiration, Bowel Regimen, Delirium Protocol, Frequent Pain Assessment, Oral Care and Turn Reposition  Psycho-social/Spiritual:   Desire for further Chaplaincy support: yes  Additional Recommendations: Caregiving  Support/Resources  Prognosis:   Unable to determine  Discharge Planning: To Be Determined      Primary Diagnoses: Present on Admission: . Acute respiratory failure with hypoxia ([place]) . Acute on chronic combined systolic and diastolic CHF (congestive heart failure) ([place]) . Generalized anxiety disorder . Essential hypertension . Elevated troponin level not due myocardial infarction   I have reviewed the medical record, interviewed the patient and family, and examined the patient. The following aspects are pertinent.  Past Medical History:  Diagnosis Date  . Acute renal failure (ARF) ([place]) 08/21/2020  . Anemia    NOS  . Anxiety   . Blindness of left eye     near blindness. 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 Blurry vision 09/10/2020  . BPH (benign prostatic hypertrophy)   . Broken foot Oct. 12, 2013   Right foot Fx  . CAD (coronary artery disease)   . Carotid stenosis, left   . Cellulitis and abscess of leg 03/2018   right leg  . CHF (congestive heart failure) ([place])   . Chronic hyponatremia   . Chronic pain syndrome   . COPD (chronic obstructive pulmonary disease) ([place])   . Depression   . Diabetes mellitus   . Diabetic foot ulcer ([place])   . DJD (degenerative joint disease)    L wrist  . DM2 (diabetes mellitus, type 2) ([place])   . GERD (gastroesophageal reflux disease)   . HLD (hyperlipidemia)   . HTN (hypertension)   . Hx of blood transfusion reaction   . Hyponatremia   . Lumbar disc disease   . MI (mitral incompetence)   . Myocardial infarction ([place])    x 2  . Osteoporosis    tx  per [facility], Dr Prudencio Burly, thought due to heavy steriod use after 1978  . Peripheral neuropathy   . Peripheral vascular disease ([place])   . Pseudomonas infection 10/10/2020  . Restless leg syndrome   . Spine fracture    hx, multiple  . Varicose veins   . 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 Vitamin D deficiency    Social History   Socioeconomic History  . Marital status: Divorced    Spouse name: Not on file  . Number of children: Not on file  . Years of education: Not on file  . Highest education level: Not on file  Occupational History  . Not on file  Tobacco Use  . Smoking status: Former Smoker    Packs/day: 1.50    Years: 30.00    Pack years: 45.00    Types: Cigarettes    Quit date: 11/06/1988    Years since quitting: 32.0  . Smokeless tobacco: Never Used  Vaping Use  . Vaping Use: Never used  Substance and Sexual Activity  . Alcohol use: Not Currently    Comment: None in 30 years but drank beer before- 05/09/20  . Drug use: No  . Sexual activity: Not Currently  Other Topics Concern  . Not on file  Social History Narrative   Social History       Diet? I eat about what I want.       Do you drink/eat things with caffeine? yes      Marital status?                  divorced                  What year were you married? 1966 to 2007      Do you live in a house, apartment, assisted living, condo, trailer, etc.? [place]      Is it one or more stories? one      How many persons live in your home? Me only      Do you have any pets in your home? (please list) no      Highest level of education completed? Finished high school      Current or past profession: Clinical biochemist in [place] you exercise?               no                       Type & how often?      Advanced Directives      Do you have a living will? no      Do you have a DNR form?     no                             If not, do you want to discuss one?      Do you have signed POA/HPOA for forms? No      Functional Status      Do you have difficulty bathing or dressing yourself? No- shower      Do you have difficulty preparing food or eating? No- Don't want to      Do you have difficulty managing your medications?      Do you have difficulty managing your finances?      Do you have difficulty affording your  medications? No- paid for by [place] Strain: Not on [employer] Insecurity: Not on file  Transportation Needs: Not on file  Physical Activity: Not on file  Stress: Not on file  Social Connections: Not on file   Family History  Problem Relation Age of Onset  . Lung cancer Father 76  . Diabetes Mother 8  . Osteoporosis Mother   . Depression Mother   . Bleeding Disorder Mother   . Throat cancer Other        family hx - "bad living" - also lung CA, heart disease and renal failure    . Diabetes Sister   . Suicidality Son 48  . Lung cancer Maternal Grandmother 60  . Diabetes Maternal Grandfather 63  . Heart disease Maternal Grandfather   . Heart attack Paternal Grandmother    . Brain cancer Paternal Grandfather 66   Scheduled Meds: . ARIPiprazole  5 mg Oral Daily  . buPROPion  300 mg Oral Daily  . carvedilol  12.5 mg Oral BID WC  . insulin aspart  0-15 Units Subcutaneous TID AC & HS  . isosorbide mononitrate  60 mg Oral Daily  . methylPREDNISolone (SOLU-MEDROL) injection  60 mg Intravenous Q6H  . mometasone-formoterol  2 puff Inhalation BID  . pantoprazole (PROTONIX) IV  40 mg Intravenous Q12H  . rOPINIRole  1 mg Oral BID  . tamsulosin  0.4 mg Oral BID  . ticagrelor  90 mg Oral BID   Continuous Infusions: . azithromycin 500 mg (11/08/20 2022)  . cefTRIAXone (ROCEPHIN)  IV 2 g (11/08/20 1949)   PRN Meds:.acetaminophen, albuterol, haloperidol lactate, metoprolol tartrate, nitroGLYCERIN, ondansetron (ZOFRAN) IV Medications Prior to Admission:  Prior to Admission medications   Medication Sig Start Date End Date Taking? Authorizing Provider  Alirocumab (PRALUENT) 75 MG/ML SOAJ Inject 75 mg into the skin every 14 (fourteen) days.   Yes [provider]  clonazePAM (KLONOPIN) 0.5 MG tablet Take 0.5 mg by mouth 2 (two) times daily as needed for anxiety.   Yes [provider]  gabapentin (NEURONTIN) 400 MG capsule Take 400 mg by mouth as directed.   Yes [provider]  ticagrelor (BRILINTA) 90 MG TABS tablet Take 90 mg by mouth 2 (two) times daily.   Yes [provider]  ACCU-CHEK FASTCLIX LANCETS MISC Use as directed to check blood glucose. Dx: E11.51 01/17/18   [name], NP  acetaminophen (TYLENOL) 500 MG tablet Take 1,000 mg by mouth every 6 (six) hours as needed for mild pain, fever or headache.    [provider]  albuterol (VENTOLIN HFA) 108 (90 Base) MCG/ACT inhaler Inhale 2 puffs into the lungs every 6 (six) hours as needed for wheezing or shortness of breath.    [provider]  ARIPiprazole (ABILIFY) 2 MG tablet Take 4 mg by mouth in the morning. 10/20/20   [provider]  aspirin EC  81 MG tablet Take 1 tablet (81 mg total) by mouth daily. 07/15/20   Spero Geralds, MD  Blood Glucose Monitoring Suppl (ACCU-CHEK AVIVA PLUS) w/Device KIT Use as directed to check blood glucose daily. Dx: E11.51 01/17/18   [name], NP  buPROPion (WELLBUTRIN XL) 300 MG 24 hr tablet Take 1 tablet by mouth once daily 10/20/20   Donnal Moat T, PA-C  carvedilol (COREG) 3.125 MG tablet Take 3.125 mg by mouth 2 (two) times daily. 10/07/20   [provider]  cephALEXin (KEFLEX) 500 MG capsule Take 1 capsule (500 mg total) by mouth 2 (two) times daily. 10/10/20   Truman Hayward, MD  Cholecalciferol (VITAMIN D3) 5000 units TABS Take 5,000 Units by mouth daily.    [provider]  ENTRESTO 49-51 MG Take 1 tablet by mouth 2 (two) times daily. 10/07/20   [provider]  furosemide (LASIX) 20 MG tablet Take 20 mg by mouth 2 (two) times daily. 10/04/20   [provider]  glucose blood (ACCU-CHEK AVIVA PLUS) test strip 1 each by Other route as needed for other. Use as instructed. Dx: E11.51 01/17/18   [name], NP  insulin aspart (NOVOLOG) 100 UNIT/ML injection Inject 0-9 Units into the skin 3 (three) times daily with meals. 09/04/20   Barb Merino, MD  IRON PO Take 1 capsule by mouth daily.    [provider]  isosorbide mononitrate (IMDUR) 60 MG 24 hr tablet Take 60 mg by mouth daily. 12/11/19   [provider]  JANUVIA 25 MG tablet Take 25 mg by mouth daily. 09/23/20   [provider]  Multiple Vitamin (MULTIVITAMIN WITH MINERALS) TABS tablet Take 1 tablet by mouth daily.    [provider]  NARCAN 4 MG/0.1ML LIQD nasal spray kit Place 4 mg into the nose as needed (opioid reversal). 06/18/19   [provider]  nitroGLYCERIN (NITROSTAT) 0.4 MG SL tablet PLACE 1 TABLET UNDER THE TONGUE EVERY 5 MINUTES AS NEEDED FOR CHEST PAIN Patient taking differently: Place 0.4 mg under the tongue every 5 (five) minutes as needed  for chest pain. 07/17/19   Adrian Prows, MD  nystatin (MYCOSTATIN/NYSTOP) powder Apply 1 application topically 3 (three) times daily. 09/10/20   Truman Hayward, MD  pantoprazole (PROTONIX) 40 MG tablet Take 1 tablet (40 mg total) by mouth 2 (two) times daily. 09/04/20   Barb Merino, MD  rOPINIRole (REQUIP) 1 MG tablet Take 1 tablet (1 mg total) by mouth in the morning and at bedtime. 07/24/20   Addison Lank, PA-C  tamsulosin (FLOMAX) 0.4 MG CAPS capsule Take 1 capsule (0.4 mg total) by mouth 2 (two) times daily. 11/16/18   [name], NP   Allergies  Allergen Reactions  . Ivp Dye [Iodinated Diagnostic Agents]     Blood Pressure dropped----pt was pre-medicated with 13 hour prep and did fine with pre-meds--amy 03/09/11   . Sulfa Antibiotics Hives  . Sulfonamide Derivatives Hives and Itching  . Lisinopril Cough  . Metoprolol Nausea And Vomiting  . Statins Other (See Comments)    myalgias  . Propoxyphene N-Acetaminophen Other (See Comments)    Sharp pains- headache   Review of Systems  Neurological: Positive for weakness.   Physical Exam Vitals and nursing note reviewed.  Constitutional:      General: He is awake.  HENT:     Head: Normocephalic and atraumatic.  Cardiovascular:     Rate and Rhythm: Regular rhythm.  Pulmonary:     Effort: No tachypnea, accessory muscle usage or respiratory distress.  Abdominal:     Tenderness: There is no abdominal tenderness.  Skin:    General: Skin is warm and dry.  Neurological:     Mental Status: He is alert and oriented to person, place, and time.  Psychiatric:        Mood and Affect: Mood normal.        Speech: Speech normal.        Behavior: Behavior normal.  Cognition and Memory: Cognition normal.    Vital Signs: BP 128/61 (BP Location: Right Arm)   Pulse 93   Temp 97.8 F (36.6 C) (Oral)   Resp 16   Ht 6' (1.829 m)   Wt 91 kg   SpO2 98%   BMI 27.21 kg/m  Pain Scale: 0-10   Pain Score: 3    SpO2: SpO2:  98 % O2 Device:SpO2: 98 % O2 Flow Rate: .O2 Flow Rate (L/min): 2 L/min (Weaned from 5 L to 2 L, 100% on 5 L)  IO: Intake/output summary:   Intake/Output Summary (Last 24 hours) at 11-26-20 1448 Last data filed at 11/08/2020 2000 Gross per 24 hour  Intake --  Output 200 ml  Net -200 ml    LBM: Last BM Date: 11/06/20 Baseline Weight: Weight: 84.8 kg Most recent weight: Weight: 91 kg     Palliative Assessment/Data: PPS 40%   Flowsheet Rows   Flowsheet Row Most Recent Value  Intake Tab   Referral Department Hospitalist  Unit at Time of Referral Cardiac/Telemetry Unit  Palliative Care Primary Diagnosis Sepsis/Infectious Disease  Palliative Care Type Return patient Palliative Care  Reason for referral Clarify Goals of Care  Date first seen by Palliative Care 11/26/20  Clinical Assessment   Palliative Performance Scale Score 40%  Psychosocial & Spiritual Assessment   Palliative Care Outcomes   Patient/Family meeting held? Yes  Who was at the meeting? patient, friend [name])  Palliative Care Outcomes Clarified goals of care, Provided end of life care assistance, Provided psychosocial or spiritual support, ACP counseling assistance, Linked to palliative care logitudinal support     Time Total: 10 Greater than 50%  of this time was spent counseling and coordinating care related to the above assessment and plan.  Signed by:  Ihor Dow, DNP, FNP-C Palliative Medicine Team  Phone: [phone] Fax: [phone]   Please contact Palliative Medicine Team phone at [phone] for questions and concerns.  For individual provider: See Shea Evans

## 2020-11-14 NOTE — Progress Notes (Signed)
Reacfhed out to patient's Son Montgomery Favor left a message, Patient's friend [name] was notified about the patient's death. [facility] in [place] was contacted at [phone]. Post Mortem was completed, patient was transported to [place].

## 2020-11-14 NOTE — Progress Notes (Signed)
Pt started to desat at the beginning of the shift. Resp. Therapist was contated, pt was placed on Bi-pap, o2 sats stabilized for 20 minutes, then o2 sats dropped while patient is on bi-pap, then asystole. Patient was a DNR, Rapid response was notified but patient went into cardiac arrest before RR came. Dr Myna Hidalgo was notified and came at bedside.

## 2020-11-14 NOTE — Progress Notes (Addendum)
12-08-2020 Central Monitor called at 0905 patient had a 5 beat run of vtach. Rn went to assess patient he was asymptomatic and was not having any distress. Dr Starla Link was made aware today. Continue to monitor patient. [facility] Rn.

## 2020-11-14 NOTE — Progress Notes (Signed)
Cross-coverage note:   Notified that patient became unresponsive and was noted to be in asystole. He was DNR. Time of death was 21:25. RN is trying to reach family.

## 2020-11-14 NOTE — Progress Notes (Signed)
Patient ID: [name], male   DOB: Mar 22, 1946, 75 y.o.   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  EMS found the patient to be hypotensive with blood pressure of 80/40 and hypoxic with oxygen saturation of 80% on room air.  He was given normal saline bolus.  In the ED, patient was quite hypoxic requiring nonrebreather mask and subsequently 5 L oxygen via nasal cannula.  BNP was 2212.  He was started on Rocephin, Zithromax and Lasix.  Assessment & Plan:   Acute hypoxic respiratory failure -Presented with worsening shortness of breath and was quite hypoxic on presentation requiring nonrebreather mask initially.  Currently still on 6 L high flow nasal cannula oxygen.  Wean off as able.  CT of the chest without contrast was suggestive of multifocal pneumonia -If respiratory status does not improve, might need CT chest with contrast and/or pulmonary evaluation  Multifocal pneumonia -COVID-19 and influenza tests were negative.  Follow cultures.  Continue Rocephin and Zithromax -SLP evaluation  Acute on chronic combined systolic and diastolic heart failure -Hold Lasix for today because of increased creatinine.  Strict input and output.  Daily  weights.  Fluid restriction.  Echo showed EF of 25 to 30% with moderate mitral regurgitation and tricuspid regurgitation.  Continue Coreg and isosorbide mononitrate  Acute kidney injury -Probably from respiratory failure and diuretic use.  Hold Lasix for today.  COPD with possible exacerbation -Continue Solu-Medrol.  Continue Dulera along with nebs  Acute metabolic encephalopathy -Probably from hypoxia and Phenergan.  Off Phenergan.  Monitor mental status.  Fall precautions.  CT of the head without contrast showed no acute findings  Probable upper GI bleeding -Patient had some coffee-ground emesis overnight.  Hemoglobin has remained stable.  Continue empiric IV Protonix.  Patient is in no condition to undergo endoscopic evaluation.  Elevated LFTs -Probably from congestive hepatopathy.  Repeat a.m. LFTs.  History of septic arthritis of bilateral shoulders -Patient has had complicated history in the past several months requiring multiple hospitalizations for septic arthritis of bilateral shoulders with group B strep/Pseudomonas identified.  Patient has currently been on oral Keflex as an outpatient as per ID.  Currently on Rocephin so Keflex on hold.  Outpatient follow-up with ID  SIRS -Possibly from above.  Follow cultures.  No IV fluids at this time because of heart failure  Mildly elevated troponins in a patient with history of CAD/CABG -Troponins did not trend up.  Most likely from demand ischemia.  2D echo as above. -Continue aspirin, Coreg, isosorbide mononitrate, ticagrelor.    GERD -Continue PPI  Generalized conditioning -Overall prognosis is very poor.  Palliative care consultation for goals of care discussion -PT evaluation once more stable  Generalized anxiety disorder -Continue bupropion and Abilify.   DVT prophylaxis: Lovenox Code Status: DNR Family Communication: None at bedside Disposition Plan: Status is: Inpatient  Remains inpatient appropriate  because:Inpatient level of care appropriate due  to severity of illness   Dispo: The patient is from: Home              Anticipated d/c is to: Home in several days following clinical improvement              Patient currently is not medically stable to d/c.   Difficult to place patient No  Consultants: None   Procedures: None  Antimicrobials: Rocephin and Zithromax from 10/18/2020 onwards   Subjective: Patient seen and examined at bedside.  Very poor historian.  No overnight fever, vomiting reported.  Still feels short of breath with exertion.  Objective: Vitals:   11/08/20 2000 11/08/20 2129 12/03/20 0002 12-03-2020 0415  BP: 113/86  (!) 109/57 (!) 125/59  Pulse: (!) 102 (!) 104 98 96  Resp: 20 20 (!) 21 18  Temp: 98.8 F (37.1 C)  98.4 F (36.9 C) 97.6 F (36.4 C)  TempSrc: Oral  Oral Oral  SpO2: 100% 98% 100% 100%  Weight:    91 kg  Height:        Intake/Output Summary (Last 24 hours) at 12-03-20 0753 Last data filed at 11/08/2020 2000 Gross per 24 hour  Intake -  Output 200 ml  Net -200 ml   Filed Weights   11/06/2020 1443 10/14/2020 2153 Dec 03, 2020 0415  Weight: 84.8 kg 87.3 kg 91 kg    Examination:  General exam: Appears chronically ill.  No acute distress.  Still on 6 L high flow nasal cannula oxygen.   Respiratory system: Decreased breath sounds at bases bilaterally with crackles and intermittent tachypnea Cardiovascular system: Tachycardic, S1-S2 heard Gastrointestinal system: Abdomen is mildly distended, soft and nontender.  Bowel sounds are heard  extremities: Trace lower extremity edema present; no cyanosis Central nervous system: Awake, answers a few questions but very slow to respond.  Poor historian.  No focal neurological deficits.  Moves extremities  skin: No obvious ecchymosis/rashes Psychiatry: Cannot be assessed because of mental status    Data Reviewed: I have personally reviewed following labs and imaging studies  CBC: Recent Labs  Lab  10/30/2020 1449 11/03/2020 1533 10/26/2020 2000 11/08/20 0146 03-Dec-2020 0109  WBC 9.0  --   --  8.6 6.3  NEUTROABS 6.7  --   --   --  5.3  HGB 10.5* 11.9* 11.2* 11.0* 10.6*  HCT 34.0* 35.0* 33.0* 34.3* 33.7*  MCV 93.9  --   --  92.5 94.1  PLT 369  --   --  382 478   Basic Metabolic Panel: Recent Labs  Lab 11/11/2020 1449 10/15/2020 1533 10/21/2020 2000 10/28/2020 2212 11/08/20 0146 12-03-2020 0109  NA 134* 136 134*  --  135 136  K 3.8 3.7 4.0  --  4.4 5.3*  CL 101 100  --   --  101 103  CO2 22  --   --   --  22 21*  GLUCOSE 182* 185*  --   --  169* 138*  BUN 18 20  --   --  19 40*  CREATININE 0.89 0.80  --   --  1.01 1.61*  CALCIUM 8.8*  --   --   --  9.0 8.6*  MG  --   --   --  1.9  --  2.1   GFR: Estimated Creatinine Clearance: 44.2 mL/min (A) (by C-G formula based on SCr of 1.61 mg/dL (H)). Liver Function Tests: Recent Labs  Lab 10/15/2020 1449 12/03/2020 0109  AST 30 248*  ALT 116* 191*  ALKPHOS 182* 177*  BILITOT 1.1 1.4*  PROT 6.9 6.4*  ALBUMIN 2.5* 2.4*   No results for input(s): LIPASE, AMYLASE in the last 168 hours. Recent Labs  Lab November 16, 2020 0109  AMMONIA 38*   Coagulation Profile: Recent Labs  Lab 10/28/2020 2212  INR 1.4*   Cardiac Enzymes: No results for input(s): CKTOTAL, CKMB, CKMBINDEX, TROPONINI in the last 168 hours. BNP (last 3 results) No results for input(s): PROBNP in the last 8760 hours. HbA1C: Recent Labs    11/01/2020 2212  HGBA1C 7.6*   CBG: Recent Labs  Lab 11/08/20 1026 11/08/20 1357 11/08/20 1713 11/08/20 2121 11/16/2020 0748  GLUCAP 158* 141* 243* 151* 195*   Lipid Profile: No results for input(s): CHOL, HDL, LDLCALC, TRIG, CHOLHDL, LDLDIRECT in the last 72 hours. Thyroid Function Tests: Recent Labs    2020-11-16 0109  TSH 2.409   Anemia Panel: Recent Labs    11-16-2020 0109  VITAMINB12 1,859*  FOLATE 26.9   Sepsis Labs: Recent Labs  Lab 10/27/2020 2029 10/29/2020 2212 11/08/20 0146  PROCALCITON  --  <0.10  --    LATICACIDVEN 2.6* 2.5* 3.3*    Recent Results (from the past 240 hour(s))  Resp Panel by RT-PCR (Flu A&B, Covid) Urine, Clean Catch     Status: None   Collection Time: 10/20/2020  4:11 PM   Specimen: Urine, Clean Catch; Nasopharyngeal(NP) swabs in vial transport medium  Result Value Ref Range Status   SARS Coronavirus 2 by RT PCR NEGATIVE NEGATIVE Final    Comment: (NOTE) SARS-CoV-2 target nucleic acids are NOT DETECTED.  The SARS-CoV-2 RNA is generally detectable in upper respiratory specimens during the acute phase of infection. The lowest concentration of SARS-CoV-2 viral copies this assay can detect is 138 copies/mL. A negative result does not preclude SARS-Cov-2 infection and should not be used as the sole basis for treatment or other patient management decisions. A negative result may occur with  improper specimen collection/handling, submission of specimen other than nasopharyngeal swab, presence of viral mutation(s) within the areas targeted by this assay, and inadequate number of viral copies(<138 copies/mL). A negative result must be combined with clinical observations, patient history, and epidemiological information. The expected result is Negative.  Fact Sheet for Patients:  EntrepreneurPulse.com.au  Fact Sheet for Healthcare Providers:  IncredibleEmployment.be  This test is no t yet approved or cleared by the [place] FDA and  has been authorized for detection and/or diagnosis of SARS-CoV-2 by FDA under an Emergency Use Authorization (EUA). This EUA will remain  in effect (meaning this test can be used) for the duration of the COVID-19 declaration under Section 564(b)(1) of the Act, 21 U.S.C.section 360bbb-3(b)(1), unless the authorization is terminated  or revoked sooner.       Influenza A by PCR NEGATIVE NEGATIVE Final   Influenza B by PCR NEGATIVE NEGATIVE Final    Comment: (NOTE) The Xpert Xpress SARS-CoV-2/FLU/RSV  plus assay is intended as an aid in the diagnosis of influenza from Nasopharyngeal swab specimens and should not be used as a sole basis for treatment. Nasal washings and aspirates are unacceptable for Xpert Xpress SARS-CoV-2/FLU/RSV testing.  Fact Sheet for Patients: EntrepreneurPulse.com.au  Fact Sheet for Healthcare Providers: IncredibleEmployment.be  This test is not yet approved or cleared by the [place] FDA and has been authorized for detection and/or diagnosis of SARS-CoV-2 by FDA under an Emergency Use Authorization (EUA). This EUA will remain in effect (meaning this test can be used) for the duration of the COVID-19 declaration  under Section 564(b)(1) of the Act, 21 U.S.C. section 360bbb-3(b)(1), unless the authorization is terminated or revoked.  Performed at [facility], [place] [address], [place], [place] [address]   Blood Culture (routine x 2)     Status: None (Preliminary result)   Collection Time: 10/25/2020  8:29 PM   Specimen: Site Not Specified; Blood  Result Value Ref Range Status   Specimen Description SITE NOT SPECIFIED  Final   Special Requests   Final    BOTTLES DRAWN AEROBIC AND ANAEROBIC Blood Culture results may not be optimal due to an inadequate volume of blood received in culture bottles   Culture   Final    NO GROWTH < 24 HOURS Performed at [facility], [address] [place], [place] [address]    Report Status PENDING  Incomplete  Blood Culture (routine x 2)     Status: None (Preliminary result)   Collection Time: 11/02/2020  8:29 PM   Specimen: BLOOD  Result Value Ref Range Status   Specimen Description BLOOD RIGHT ANTECUBITAL  Final   Special Requests   Final    BOTTLES DRAWN AEROBIC AND ANAEROBIC Blood Culture adequate volume   Culture   Final    NO GROWTH < 24 HOURS Performed at [facility], [place] [address] [place], [place] [address]    Report Status PENDING  Incomplete  MRSA PCR  Screening     Status: None   Collection Time: 11/08/20  6:57 PM   Specimen: Nasopharyngeal  Result Value Ref Range Status   MRSA by PCR NEGATIVE NEGATIVE Final    Comment:        The GeneXpert MRSA Assay (FDA approved for NASAL specimens only), is one component of a comprehensive MRSA colonization surveillance program. It is not intended to diagnose MRSA infection nor to guide or monitor treatment for MRSA infections. Performed at [facility], [place] [address], [place], [place] [address]          Radiology Studies: DG Chest 1 View  Result Date: 10/30/2020 CLINICAL DATA:  Shortness of breath EXAM: CHEST  1 VIEW COMPARISON:  September 17, 2020 FINDINGS: There is extensive airspace opacity throughout the lungs bilaterally with the greatest concentration of airspace opacity in each mid lung and left base regions. Apparent granuloma medial right upper lobe toward the apex. There is cardiomegaly with pulmonary venous hypertension. Patient is status post coronary artery bypass grafting. No adenopathy. Degenerative change in each shoulder. Surgical clips left neck region. IMPRESSION: Multifocal airspace opacity concerning for multifocal pneumonia. A degree of pulmonary edema could present similarly. There is cardiomegaly with pulmonary vascular congestion. Status post coronary artery bypass grafting. Apparent granuloma right upper lobe toward the apex. Electronically Signed   By: Lowella Grip III M.D.   On: 10/27/2020 15:15   CT HEAD WO CONTRAST  Result Date: 10/26/2020 CLINICAL DATA:  Nonspecific dizziness. EXAM: CT HEAD WITHOUT CONTRAST TECHNIQUE: Contiguous axial images were obtained from the base of the skull through the vertex without intravenous contrast. COMPARISON:  MRI 08/31/2020 FINDINGS: Brain: No focal abnormality affects the brainstem or cerebellum. Mild cerebellar atrophy. Cerebral hemispheres show moderate atrophy with frontal lobe predominance. There chronic small-vessel  ischemic changes of the white matter, frontal predominant. No large vessel territory infarction. No mass lesion, hemorrhage, hydrocephalus or extra-axial collection. Vascular: There is atherosclerotic calcification of the major vessels at the base of the brain. Skull: No acute finding. Sinuses/Orbits: Extensive widespread sinus surgery in the  past. Widespread mucosal thickening. Opacification of the frontal sinuses. Other: None IMPRESSION: 1. No acute finding by CT. Brain atrophy with frontal lobe predominance. Chronic small-vessel ischemic changes of the white matter. 2. Extensive widespread sinus surgery in the past. Widespread mucosal thickening. Opacification of the frontal sinuses. Electronically Signed   By: Nelson Chimes M.D.   On: 11/11/2020 17:28   CT CHEST WO CONTRAST  Result Date: 10/20/2020 CLINICAL DATA:  Respiratory failure following fall, initial encounter EXAM: CT CHEST WITHOUT CONTRAST TECHNIQUE: Multidetector CT imaging of the chest was performed following the standard protocol without IV contrast. COMPARISON:  Chest x-ray from earlier in the same day. FINDINGS: Cardiovascular: Thoracic aorta demonstrates atherosclerotic calcifications and findings consistent with prior cardiac bypass grafting. Cardiac shadow is enlarged. No pericardial effusion is seen. The pulmonary artery as visualized is within normal limits. Mediastinum/Nodes: Thoracic inlet is unremarkable. Multiple small hilar and mediastinal lymph nodes are seen likely reactive in nature. The esophagus is within normal limits. Lungs/Pleura: Lungs demonstrates some chronic interstitial changes although with diffuse multifocal superimposed acute infiltrates bilaterally. Pleural effusions are seen bilaterally as well. Upper Abdomen: Visualized upper abdomen shows left renal cyst. Musculoskeletal: Significant degenerative changes of the shoulder joints are noted bilaterally with joint effusions. Degenerative change of the thoracic spine is  seen. IMPRESSION: Diffuse multifocal infiltrates bilaterally superimposed over more chronic interstitial changes. This is most consistent with multifocal pneumonia although edema could have this appearance as well. Small bilateral pleural effusions. Reactive lymphadenopathy in the hila and mediastinum. Aortic Atherosclerosis (ICD10-I70.0). Electronically Signed   By: Inez Catalina M.D.   On: 10/29/2020 21:35   CT CERVICAL SPINE WO CONTRAST  Result Date: 10/14/2020 CLINICAL DATA:  Dizziness.  Found on the floor.  Neck pain. EXAM: CT CERVICAL SPINE WITHOUT CONTRAST TECHNIQUE: Multidetector CT imaging of the cervical spine was performed without intravenous contrast. Multiplanar CT image reconstructions were also generated. COMPARISON:  04/28/2018 FINDINGS: The study suffers from considerable motion degradation. Alignment: No malalignment. Skull base and vertebrae: No visible regional fracture allowing for the motion degradation. Soft tissues and spinal canal: No soft tissue abnormality. Disc levels: Ordinary osteoarthritis at the C1-2 articulation. C2-3 is negative. C3-4: Chronic partially calcified disc protrusion. C4-5: Chronic partially calcified disc herniation. C5-6: Chronic partially calcified disc herniation. C6-7, C7-T1 and T1-2: Negative Upper chest: Pronounced density at the lung apices consistent with pneumonia seen in chest radiography. Other: None IMPRESSION: 1. The study suffers from considerable motion degradation. No visible regional fracture. 2. Chronic partially calcified disc herniations from C3-4 through C5-6. 3. Pronounced density at the lung apices consistent with pneumonia seen in chest radiography. Electronically Signed   By: Nelson Chimes M.D.   On: 10/25/2020 17:31   ECHOCARDIOGRAM COMPLETE  Result Date: 11/08/2020    ECHOCARDIOGRAM REPORT   Patient Name:   [name] Date of Exam: 11/08/2020 Medical Rec #:  [id number]         Height:       72.0 in Accession #:    [id number]         Weight:       192.5 lb Date of Birth:  11/12/1945         BSA:          2.096 m Patient Age:    45 years          BP:           165/67 mmHg Patient Gender: M  HR:           116 bpm. Exam Location:  Inpatient Procedure: 2D Echo, Cardiac Doppler, Color Doppler and Intracardiac            Opacification Agent Indications:    CHF-Acute Systolic C14.48  History:        Patient has prior history of Echocardiogram examinations, most                 recent 07/10/2020. CHF, CAD, Prior CABG, COPD and PAD; Risk                 Factors:Hypertension, Dyslipidemia and Diabetes. GERD.                 Multifocal pneumonia. Acute metabolic encephalopathy. History of                 septic arthritis of bilateral shoulders. Anemia.  Sonographer:    Darlina Sicilian RDCS Referring Phys: [id number] [place]  1. Left ventricular ejection fraction, by estimation, is 25 to 30%. The left ventricle has severely decreased function. The left ventricle demonstrates global hypokinesis. The left ventricular internal cavity size was mildly to moderately dilated. Indeterminate diastolic filling due to E-A fusion.  2. Right ventricular systolic function is moderately reduced. The right ventricular size is mildly enlarged. There is moderately elevated pulmonary artery systolic pressure. The estimated right ventricular systolic pressure is 97.0 mmHg.  3. Left atrial size was mildly dilated.  4. Right atrial size was mildly dilated.  5. The mitral valve is grossly normal. Mild to moderate mitral valve regurgitation. No evidence of mitral stenosis.  6. Tricuspid valve regurgitation is moderate.  7. The aortic valve is tricuspid. There is mild calcification of the aortic valve. There is mild thickening of the aortic valve. Aortic valve regurgitation is mild. Mild aortic valve sclerosis is present, with no evidence of aortic valve stenosis.  8. The inferior vena cava is dilated in size with <50% respiratory variability,  suggesting right atrial pressure of 15 mmHg. Comparison(s): Changes from prior study are noted. LVEF is now severely reduced ~25-30%. LV cavity is moderately dilated. Moderately elevated RVSP. FINDINGS  Left Ventricle: Left ventricular ejection fraction, by estimation, is 25 to 30%. The left ventricle has severely decreased function. The left ventricle demonstrates global hypokinesis. The left ventricular internal cavity size was mildly to moderately dilated. There is no left ventricular hypertrophy. Indeterminate diastolic filling due to E-A fusion. Right Ventricle: The right ventricular size is mildly enlarged. No increase in right ventricular wall thickness. Right ventricular systolic function is moderately reduced. There is moderately elevated pulmonary artery systolic pressure. The tricuspid regurgitant velocity is 3.06 m/s, and with an assumed right atrial pressure of 15 mmHg, the estimated right ventricular systolic pressure is 26.3 mmHg. Left Atrium: Left atrial size was mildly dilated. Right Atrium: Right atrial size was mildly dilated. Pericardium: There is no evidence of pericardial effusion. Mitral Valve: The mitral valve is grossly normal. Mild to moderate mitral valve regurgitation. No evidence of mitral valve stenosis. Tricuspid Valve: The tricuspid valve is grossly normal. Tricuspid valve regurgitation is moderate . No evidence of tricuspid stenosis. Aortic Valve: The aortic valve is tricuspid. There is mild calcification of the aortic valve. There is mild thickening of the aortic valve. Aortic valve regurgitation is mild. Mild aortic valve sclerosis is present, with no evidence of aortic valve stenosis. Pulmonic Valve: The pulmonic valve was grossly normal. Pulmonic valve regurgitation is trivial. No evidence of  pulmonic stenosis. Aorta: The aortic root is normal in size and structure. Venous: The inferior vena cava is dilated in size with less than 50% respiratory variability, suggesting right  atrial pressure of 15 mmHg. IAS/Shunts: The atrial septum is grossly normal.  LEFT VENTRICLE PLAX 2D LVIDd:         6.23 cm LVIDs:         5.89 cm LV PW:         0.90 cm LV IVS:        0.90 cm LVOT diam:     2.60 cm LV SV:         48 LV SV Index:   23 LVOT Area:     5.31 cm  LV Volumes (MOD) LV vol d, MOD A2C: 242.0 ml LV vol d, MOD A4C: 268.0 ml LV vol s, MOD A2C: 138.0 ml LV vol s, MOD A4C: 157.0 ml LV SV MOD A2C:     104.0 ml LV SV MOD A4C:     268.0 ml LV SV MOD BP:      107.0 ml RIGHT VENTRICLE RV S prime:     7.01 cm/s TAPSE (M-mode): 1.5 cm LEFT ATRIUM             Index       RIGHT ATRIUM           Index LA diam:        5.20 cm 2.48 cm/m  RA Area:     23.40 cm LA Vol (A2C):   91.5 ml 43.65 ml/m RA Volume:   73.60 ml  35.11 ml/m LA Vol (A4C):   83.3 ml 39.74 ml/m LA Biplane Vol: 87.1 ml 41.55 ml/m  AORTIC VALVE LVOT Vmax:   82.50 cm/s LVOT Vmean:  45.100 cm/s LVOT VTI:    0.091 m  AORTA Ao Root diam: 3.60 cm MITRAL VALVE                 TRICUSPID VALVE MV Area (PHT): 4.46 cm      TR Peak grad:   37.5 mmHg MV Decel Time: 170 msec      TR Vmax:        306.00 cm/s MR Peak grad:    69.2 mmHg MR Mean grad:    49.0 mmHg   SHUNTS MR Vmax:         416.00 cm/s Systemic VTI:  0.09 m MR Vmean:        337.0 cm/s  Systemic Diam: 2.60 cm MR PISA:         3.08 cm MR PISA Eff ROA: 25 mm MR PISA Radius:  0.70 cm MV E velocity: 94.07 cm/s Eleonore Chiquito MD Electronically signed by Eleonore Chiquito MD Signature Date/Time: 11/08/2020/1:06:53 PM    Final         Scheduled Meds: . ARIPiprazole  5 mg Oral Daily  . buPROPion  300 mg Oral Daily  . carvedilol  12.5 mg Oral BID WC  . furosemide  40 mg Intravenous BID  . gabapentin  100 mg Oral BID  . insulin aspart  0-15 Units Subcutaneous TID AC & HS  . isosorbide mononitrate  60 mg Oral Daily  . methylPREDNISolone (SOLU-MEDROL) injection  60 mg Intravenous Q6H  . mometasone-formoterol  2 puff Inhalation BID  . pantoprazole (PROTONIX) IV  40 mg Intravenous Q12H   . rOPINIRole  1 mg Oral BID  . tamsulosin  0.4 mg Oral BID  . ticagrelor  90 mg Oral  BID   Continuous Infusions: . azithromycin 500 mg (11/08/20 2022)  . cefTRIAXone (ROCEPHIN)  IV 2 g (11/08/20 1949)          Aline August, MD [facility] 11/26/2020, 7:53 AM

## 2020-11-14 DEATH — deceased

## 2022-01-21 IMAGING — CR DG CHEST 2V
2 series · 2 of 2 positions shown · non-contrast
Comparison: 11/13/2019

CLINICAL DATA: Chest pain. Coronary artery disease.

EXAM:
CHEST - 2 VIEW

[chest lat]
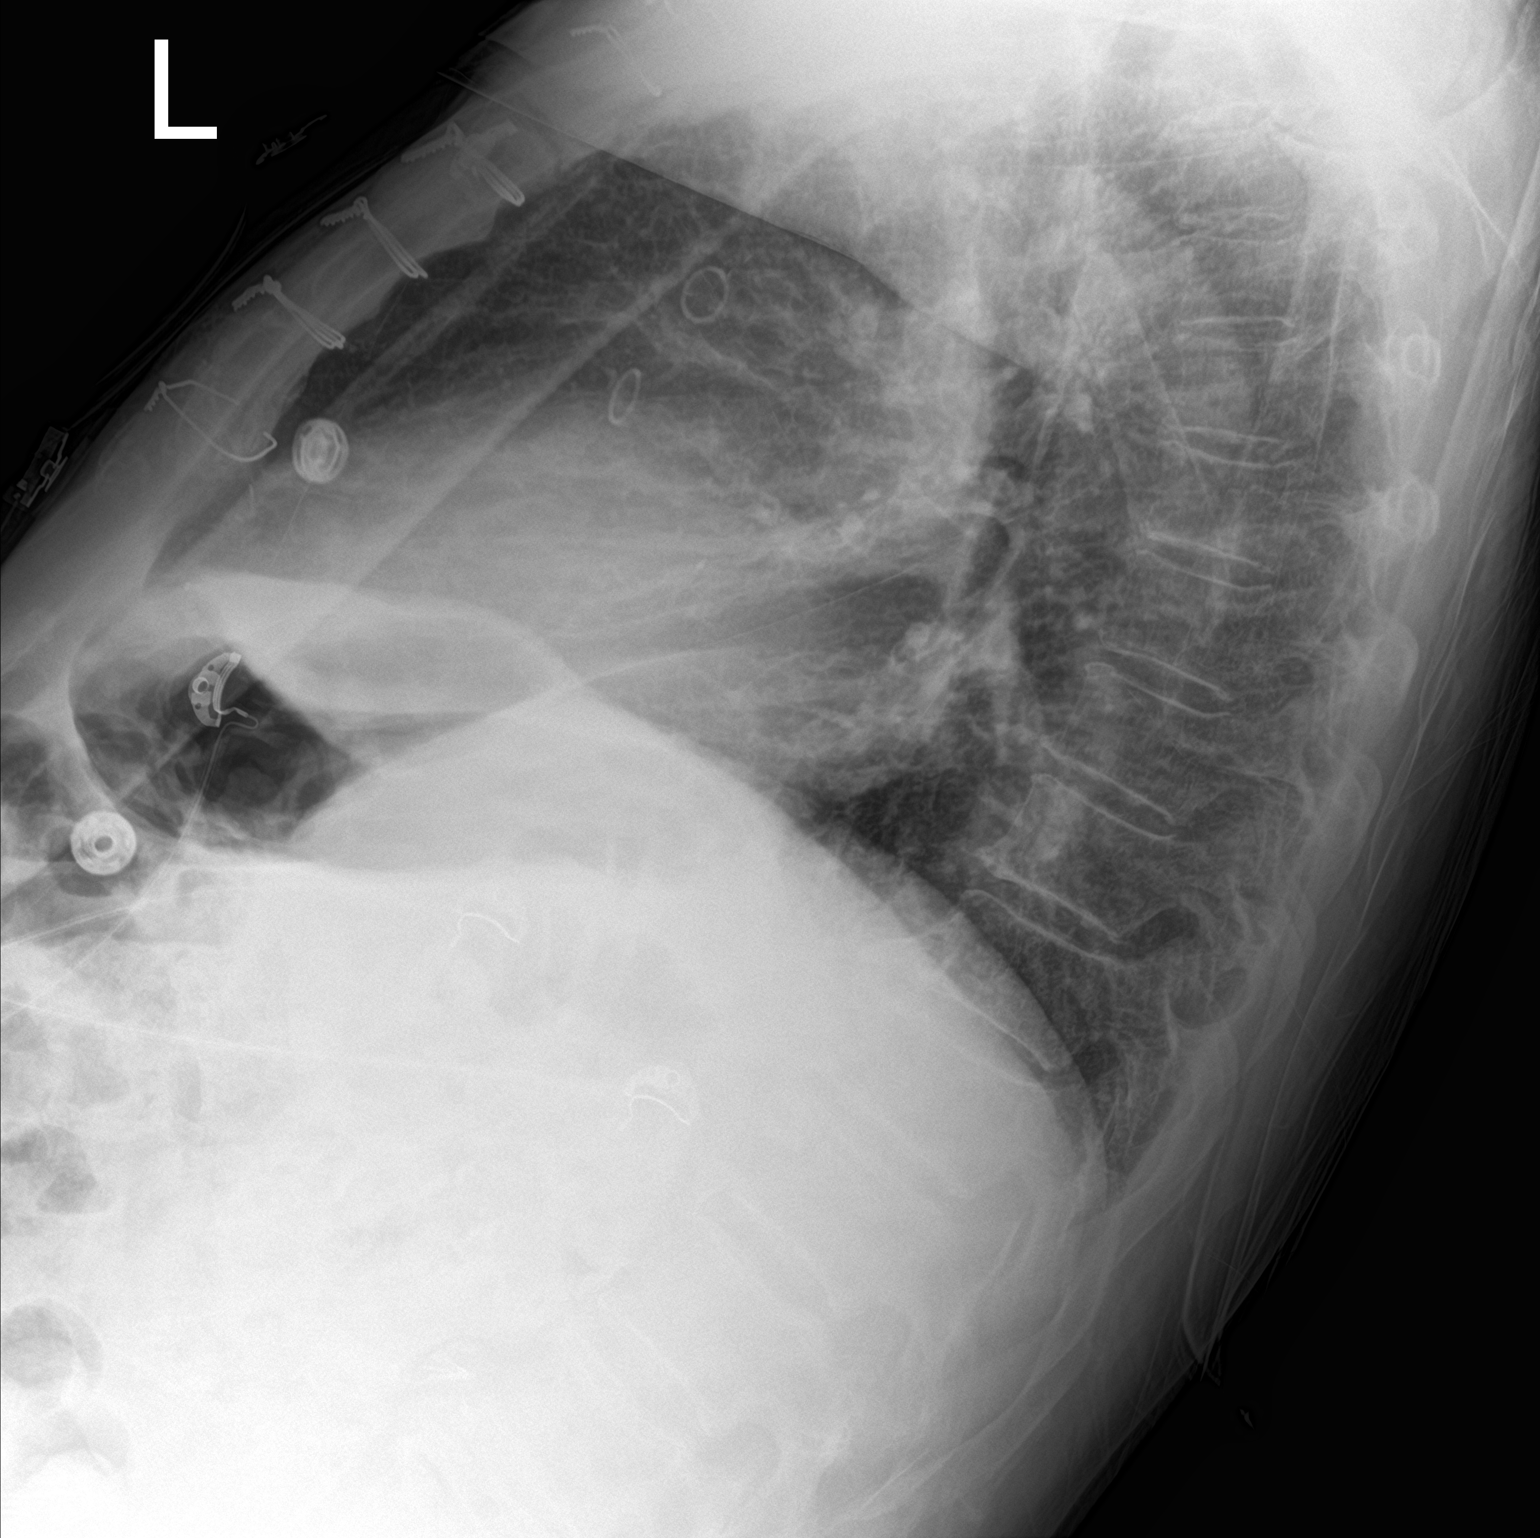

[chest ap]
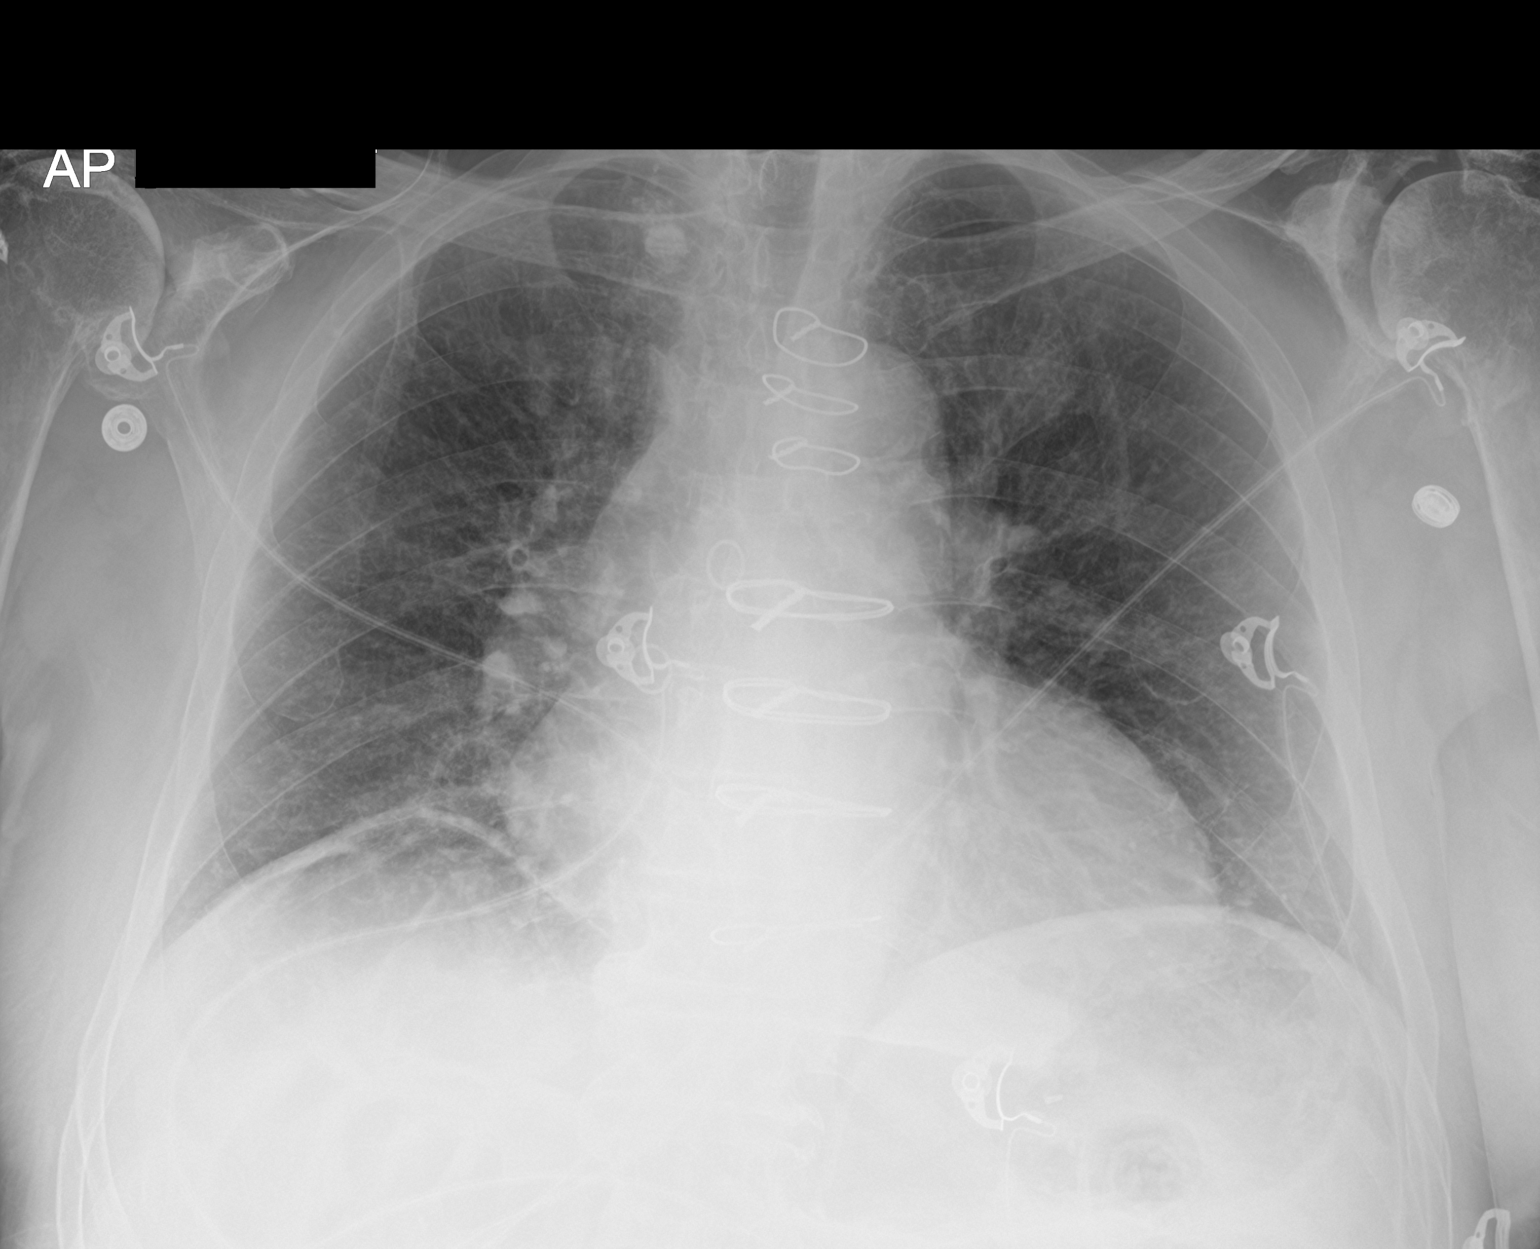

[2 of 2 positions shown; findings below may reference images not displayed]

FINDINGS: Lateral view degraded by patient arm position. Midline trachea. Mild
cardiomegaly. Atherosclerosis in the transverse aorta. Prior median
sternotomy. No pleural effusion or pneumothorax. No congestive
failure. No lobar consolidation. Numerous leads and wires project
over the chest.

Lucency under the right hemidiaphragm is likely within the colon.
Marked degenerative changes of both shoulders with high riding
humeral heads consistent with rotator cuff insufficiency.
IMPRESSION: 1. No acute cardiopulmonary disease.
2. Cardiomegaly without congestive failure.
3. Lucency under the right hemidiaphragm is most likely within the
colon. If there is a concern of free intraperitoneal air, recommend
dedicated abdominal radiographs.
4.  Aortic Atherosclerosis ([id number]).

## 2022-09-12 IMAGING — MR MR SHOULDER*R* W/O CM
4 series · 40 of 40 positions shown · non-contrast
Comparison: CT right shoulder 06/18/2020.

CLINICAL DATA: Right shoulder pain. History of septic right
shoulder joint. Status post washout, most recently 07/10/2020.

EXAM:
MRI OF THE RIGHT SHOULDER WITHOUT CONTRAST
TECHNIQUE: Multiplanar, multisequence MR imaging of the right shoulder was
performed. No intravenous contrast was administered.

[Series 6: T2 fat-sat · axial · right · 4.0mm · 0.55mm/px · z∈[-23,+74]mm · 10 of 24 slices shown (1 of 2)]
[im 1/24]
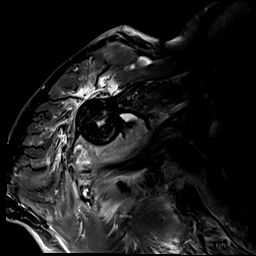
[im 3/24]
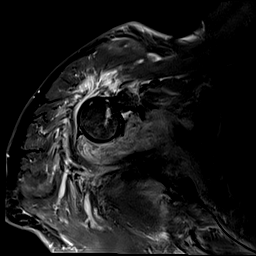
[im 6/24]
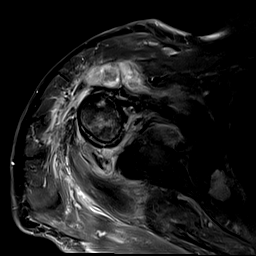
[im 8/24]
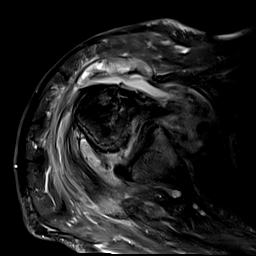
[im 11/24]
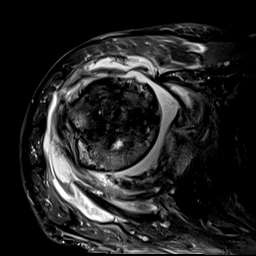
[im 13/24]
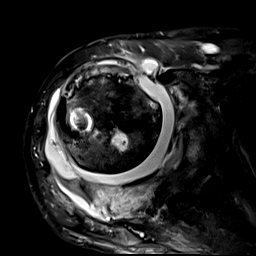
[im 16/24]
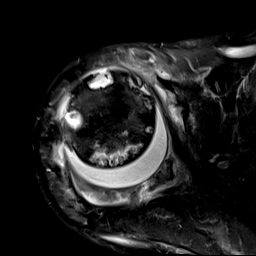
[im 18/24]
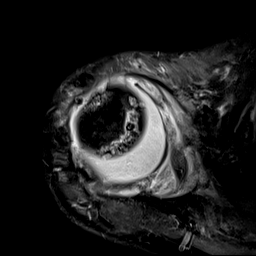
[im 21/24]
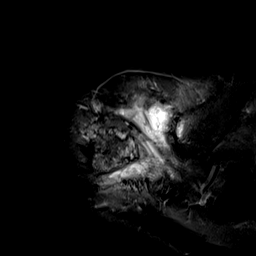
[im 24/24]
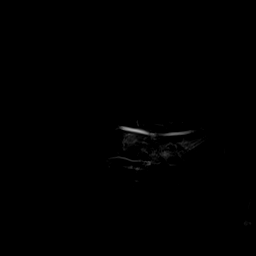

[Series 7: PD · oblique · right · 4.0mm · 0.44mm/px · 8 of 21 slices shown]
[im 1/21]
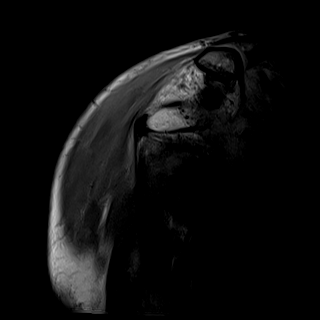
[im 3/21]
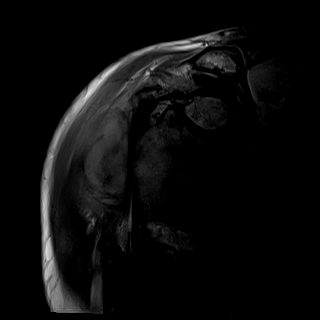
[im 6/21]
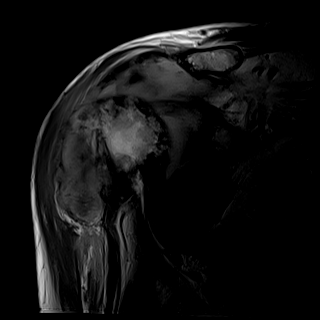
[im 9/21]
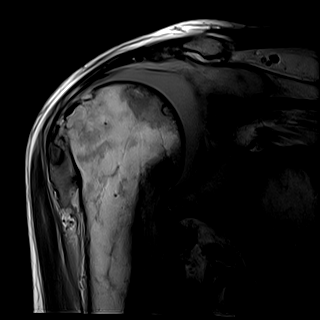
[im 12/21]
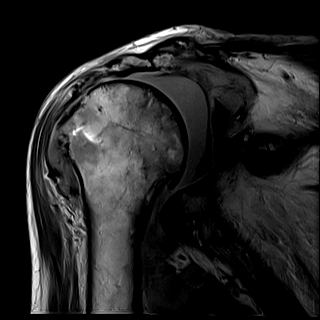
[im 15/21]
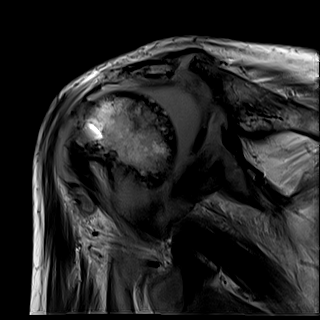
[im 18/21]
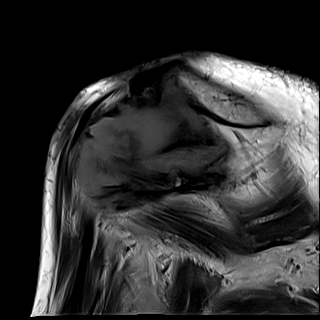
[im 21/21]
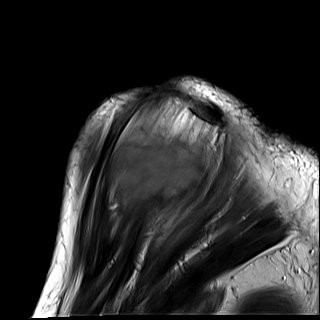

[Series 8: T2 fat-sat · oblique · right · 4.0mm · 0.44mm/px · 9 of 24 slices shown (2 of 2)]
[im 1/24]
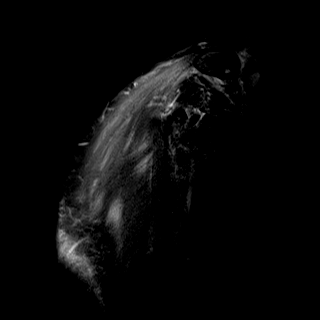
[im 3/24]
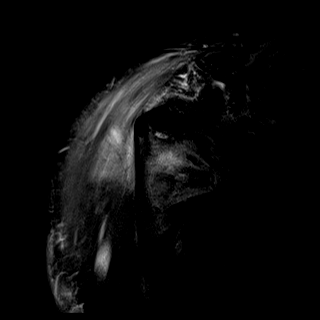
[im 6/24]
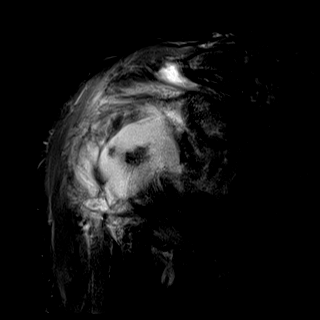
[im 9/24]
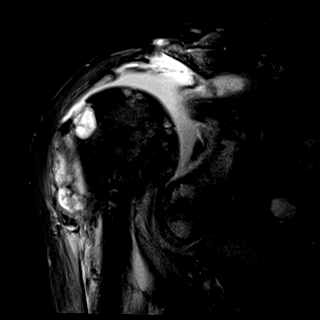
[im 12/24]
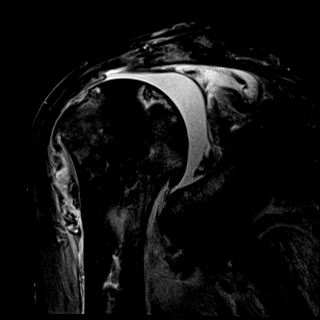
[im 15/24]
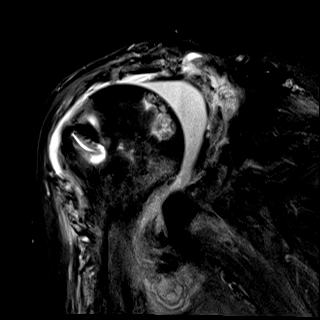
[im 18/24]
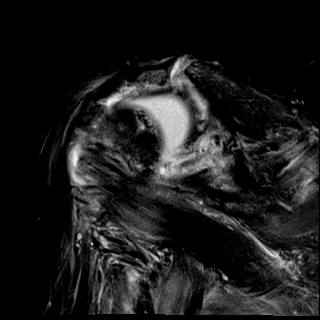
[im 21/24]
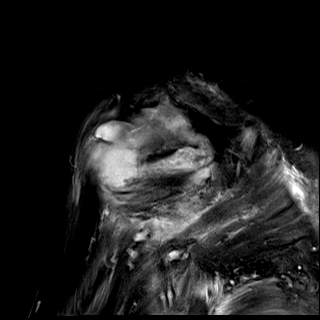
[im 24/24]
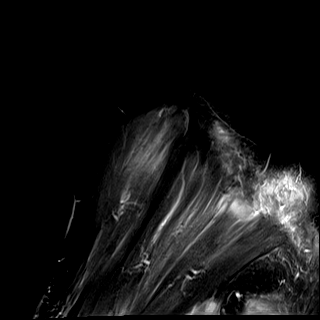

[Series 9: T1 · oblique · right · 3.0mm · 0.55mm/px · 13 of 34 slices shown]
[im 1/34]
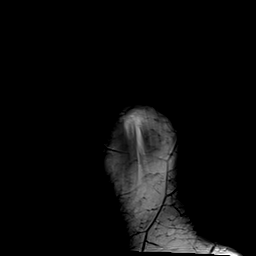
[im 3/34]
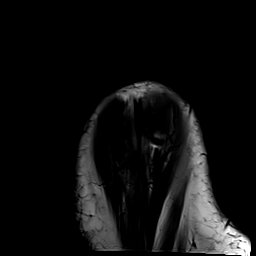
[im 6/34]
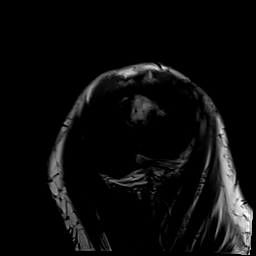
[im 9/34]
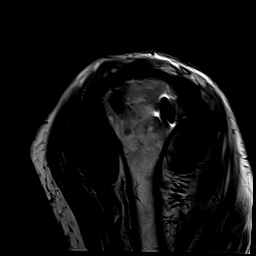
[im 12/34]
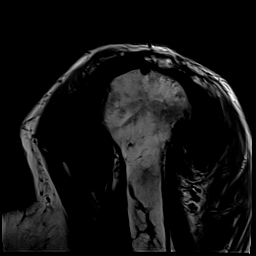
[im 14/34]
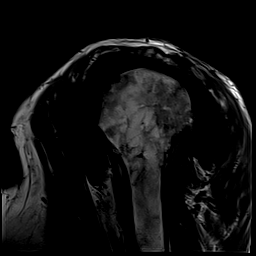
[im 17/34]
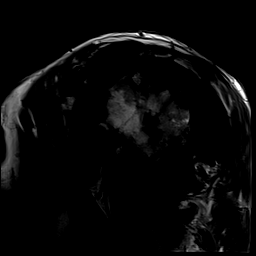
[im 20/34]
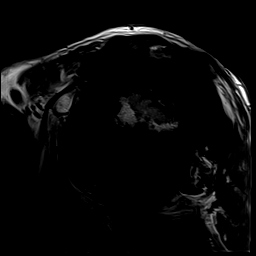
[im 23/34]
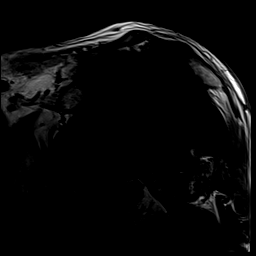
[im 25/34]
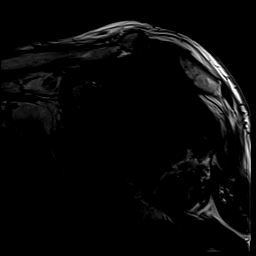
[im 28/34]
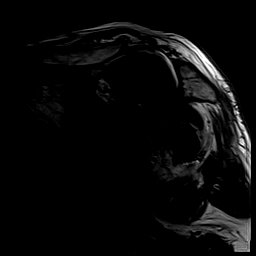
[im 31/34]
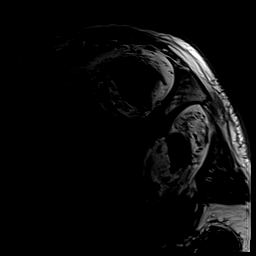
[im 34/34]
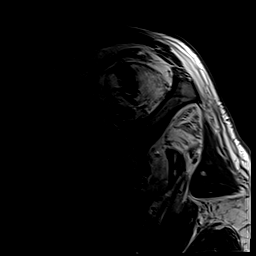

[40 of 40 positions shown; findings below may reference images not displayed]

FINDINGS: No sagittal T2 weighted series is provided. The patient refused
additional scanning.

Bones/Joint/Cartilage

As on the prior CT scan, the glenoid is markedly thinned and
remodeled. There is marrow edema in the subarticular glenoid and
glenoid neck. Patchy marrow edema is also seen in the humeral head
and neck. Erosions in the lateral periphery of the humeral head are
also again identified. There is some susceptibility artifact in the
posterior greater tuberosity.

The patient has a large glenohumeral joint which extravasates
through massive rotator cuff tears into the subacromial/subdeltoid
bursa. Structure thought to represent the superior labrum is
detached from the glenoid and lying in the glenoid fossa. No
normal-appearing labrum is identified.

The acromioclavicular joint appears intact but there is destructive
change about it and marrow edema in both the clavicular head and
acromion.

Ligaments

Negative.

Muscles and Tendons

Massive rotator cuff tears are noted. There is fatty atrophy of
musculature about the shoulder. A fluid collection in the
subscapularis muscle belly measuring 1.6 cm transverse x 1 cm AP x 1
cm craniocaudal is identified. Fluid also dissects out of the
glenohumeral joint into the infraspinatus muscle belly. Musculature
about the shoulder demonstrates increased T2 signal.

Soft tissues

Extensive edema is seen in the axillary recess and inferior to the
structure thought to represent the inferior glenohumeral ligament.
Below the inferior glenohumeral ligament, there is a heterogeneous
fluid collection measuring approximately 3.3 cm AP by at least 2 cm
craniocaudal by 1.6 cm transverse.
IMPRESSION: Very large glenohumeral joint effusion extravasating into the
subacromial/subdeltoid bursa and patchy marrow edema in the humeral
head and neck are consistent with a septic glenohumeral joint and
osteomyelitis.

Marrow edema and destructive change about the acromioclavicular
joint most compatible with septic joint and osteomyelitis.

Edema in rotator cuff muscles most worrisome for myositis. Small
fluid collection in the subscapularis is worrisome for pyomyositis.

Marked edema in the axilla. A fluid collection which appears to be
inferior to the inferior glenohumeral ligament is incompletely
visualized but worrisome for soft tissue abscess.

Massive rotator cuff tear.

## 2022-09-15 IMAGING — US IR FLUORO GUIDE CV LINE*R*
1 series · 1 of 1 positions shown · non-contrast
Comparison: none

CLINICAL DATA: Septic arthritis of the shoulders and need for
tunneled central venous catheter for long-term IV antibiotic
therapy.

[Series 1: (id) · 1 of 1 slices shown]
[im 1/1]
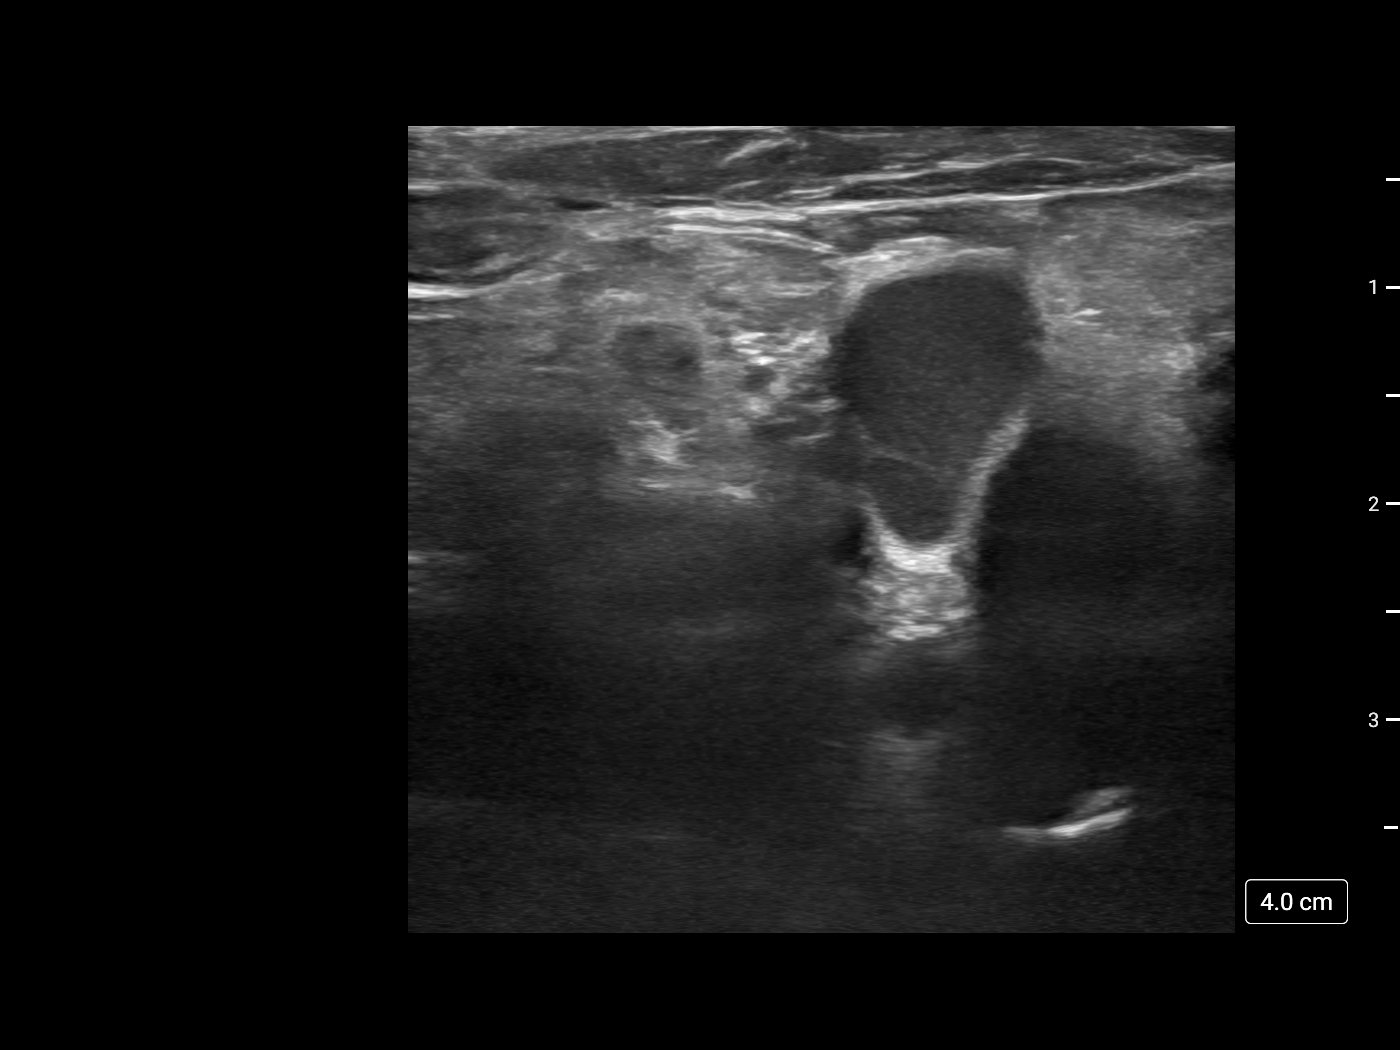

[1 of 1 positions shown; findings below may reference images not displayed]

EXAM:
TUNNELED CENTRAL VENOUS CATHETER PLACEMENT WITH ULTRASOUND AND
FLUOROSCOPIC GUIDANCE

ANESTHESIA/SEDATION:
None

MEDICATIONS:
None

FLUOROSCOPY TIME:  54 seconds.  11.0 mGy.

PROCEDURE:
The procedure, risks, benefits, and alternatives were explained to
the patient. Questions regarding the procedure were encouraged and
answered. The patient understands and consents to the procedure. A
timeout was performed prior to initiating the procedure.

The right neck and chest were prepped with chlorhexidine in a
sterile fashion, and a sterile drape was applied covering the
operative field. Maximum barrier sterile technique with sterile
gowns and gloves were used for the procedure. Local anesthesia was
provided with 1% lidocaine.

Ultrasound was used to confirm patency of the right internal jugular
vein. After creating a small venotomy incision, a 21 gauge needle
was advanced into the right internal jugular vein under direct,
real-time ultrasound guidance. Ultrasound image documentation was
performed. After securing guidewire access a 6 French peel-away
sheath was placed. A wire was kinked to measure appropriate catheter
length.

A 6 French, dual-lumen power line was chosen for placement. This was
tunneled in a retrograde fashion from the chest wall to the venotomy
incision. The catheter was cut to 24 cm based on guidewire
measurement. The catheter was then placed through the peel-away
sheath and the sheath removed. Final catheter positioning was
confirmed and documented with a fluoroscopic spot image. The
catheter was aspirated and flushed with saline.

The venotomy incision was closed with subcuticular 4-0 Vicryl.
Dermabond was applied to the incision. The catheter exit site was
secured with Prolene retention sutures.

COMPLICATIONS:
None.  No pneumothorax.
FINDINGS: After catheter placement, the tip lies at the SVC/RA junction. The
catheter aspirates normally and is ready for immediate use.
IMPRESSION: Placement of tunneled central venous catheter via the right internal
jugular vein. The catheter tip lies at the SVC/RA junction. The
catheter is ready for immediate use.
